# Patient Record
Sex: Female | Born: 1945 | Race: White | Hispanic: No | Smoking: Never smoker
Health system: Southern US, Community
[De-identification: ages and names within clinical notes are randomized; demographics above are authoritative.]

## PROBLEM LIST (undated history)

## (undated) DIAGNOSIS — M25511 Pain in right shoulder: Secondary | ICD-10-CM

## (undated) DIAGNOSIS — E785 Hyperlipidemia, unspecified: Secondary | ICD-10-CM

## (undated) DIAGNOSIS — I10 Essential (primary) hypertension: Secondary | ICD-10-CM

## (undated) DIAGNOSIS — M7730 Calcaneal spur, unspecified foot: Secondary | ICD-10-CM

## (undated) DIAGNOSIS — R0600 Dyspnea, unspecified: Secondary | ICD-10-CM

## (undated) DIAGNOSIS — F329 Major depressive disorder, single episode, unspecified: Secondary | ICD-10-CM

## (undated) DIAGNOSIS — F32A Depression, unspecified: Secondary | ICD-10-CM

## (undated) DIAGNOSIS — K922 Gastrointestinal hemorrhage, unspecified: Secondary | ICD-10-CM

## (undated) DIAGNOSIS — G629 Polyneuropathy, unspecified: Secondary | ICD-10-CM

## (undated) DIAGNOSIS — K439 Ventral hernia without obstruction or gangrene: Secondary | ICD-10-CM

## (undated) DIAGNOSIS — M7660 Achilles tendinitis, unspecified leg: Secondary | ICD-10-CM

## (undated) DIAGNOSIS — K648 Other hemorrhoids: Secondary | ICD-10-CM

## (undated) DIAGNOSIS — N179 Acute kidney failure, unspecified: Secondary | ICD-10-CM

## (undated) DIAGNOSIS — K5730 Diverticulosis of large intestine without perforation or abscess without bleeding: Secondary | ICD-10-CM

## (undated) DIAGNOSIS — I872 Venous insufficiency (chronic) (peripheral): Secondary | ICD-10-CM

## (undated) DIAGNOSIS — M1990 Unspecified osteoarthritis, unspecified site: Secondary | ICD-10-CM

## (undated) DIAGNOSIS — K219 Gastro-esophageal reflux disease without esophagitis: Secondary | ICD-10-CM

## (undated) HISTORY — DX: Diverticulosis of large intestine without perforation or abscess without bleeding: K57.30

## (undated) HISTORY — DX: Gastro-esophageal reflux disease without esophagitis: K21.9

## (undated) HISTORY — DX: Other hemorrhoids: K64.8

## (undated) HISTORY — DX: Achilles tendinitis, unspecified leg: M76.60

## (undated) HISTORY — DX: Essential (primary) hypertension: I10

## (undated) HISTORY — DX: Hyperlipidemia, unspecified: E78.5

## (undated) HISTORY — DX: Major depressive disorder, single episode, unspecified: F32.9

## (undated) HISTORY — DX: Venous insufficiency (chronic) (peripheral): I87.2

## (undated) HISTORY — DX: Ventral hernia without obstruction or gangrene: K43.9

## (undated) HISTORY — PX: TONSILLECTOMY: SUR1361

## (undated) HISTORY — DX: Polyneuropathy, unspecified: G62.9

## (undated) HISTORY — DX: Pain in right shoulder: M25.511

## (undated) HISTORY — DX: Gastrointestinal hemorrhage, unspecified: K92.2

## (undated) HISTORY — DX: Depression, unspecified: F32.A

## (undated) HISTORY — DX: Calcaneal spur, unspecified foot: M77.30

## (undated) HISTORY — PX: INCISIONAL HERNIA REPAIR: SHX193

---

## 1999-05-25 ENCOUNTER — Ambulatory Visit (HOSPITAL_COMMUNITY): Admission: RE | Admit: 1999-05-25 | Discharge: 1999-05-25 | Payer: Self-pay | Admitting: Family Medicine

## 1999-12-10 HISTORY — PX: ABDOMINAL HYSTERECTOMY: SHX81

## 1999-12-10 HISTORY — PX: CHOLECYSTECTOMY: SHX55

## 2000-04-14 ENCOUNTER — Other Ambulatory Visit: Admission: RE | Admit: 2000-04-14 | Discharge: 2000-04-14 | Payer: Self-pay | Admitting: Family Medicine

## 2000-04-15 ENCOUNTER — Encounter: Payer: Self-pay | Admitting: Family Medicine

## 2000-04-15 ENCOUNTER — Encounter: Admission: RE | Admit: 2000-04-15 | Discharge: 2000-04-15 | Payer: Self-pay | Admitting: Family Medicine

## 2000-04-28 ENCOUNTER — Encounter (INDEPENDENT_AMBULATORY_CARE_PROVIDER_SITE_OTHER): Payer: Self-pay

## 2000-04-28 ENCOUNTER — Other Ambulatory Visit: Admission: RE | Admit: 2000-04-28 | Discharge: 2000-04-28 | Payer: Self-pay | Admitting: General Surgery

## 2000-05-22 ENCOUNTER — Encounter (INDEPENDENT_AMBULATORY_CARE_PROVIDER_SITE_OTHER): Payer: Self-pay

## 2000-05-22 ENCOUNTER — Other Ambulatory Visit: Admission: RE | Admit: 2000-05-22 | Discharge: 2000-05-22 | Payer: Self-pay | Admitting: Obstetrics and Gynecology

## 2000-08-29 ENCOUNTER — Other Ambulatory Visit: Admission: RE | Admit: 2000-08-29 | Discharge: 2000-08-29 | Payer: Self-pay | Admitting: Obstetrics and Gynecology

## 2000-09-22 ENCOUNTER — Inpatient Hospital Stay (HOSPITAL_COMMUNITY): Admission: RE | Admit: 2000-09-22 | Discharge: 2000-09-25 | Payer: Self-pay | Admitting: Obstetrics and Gynecology

## 2000-09-22 ENCOUNTER — Encounter (INDEPENDENT_AMBULATORY_CARE_PROVIDER_SITE_OTHER): Payer: Self-pay

## 2000-10-04 ENCOUNTER — Inpatient Hospital Stay (HOSPITAL_COMMUNITY): Admission: AD | Admit: 2000-10-04 | Discharge: 2000-10-13 | Payer: Self-pay | Admitting: Obstetrics & Gynecology

## 2000-10-06 ENCOUNTER — Encounter: Payer: Self-pay | Admitting: Obstetrics & Gynecology

## 2000-10-09 ENCOUNTER — Encounter: Payer: Self-pay | Admitting: Obstetrics & Gynecology

## 2001-06-02 ENCOUNTER — Encounter: Admission: RE | Admit: 2001-06-02 | Discharge: 2001-06-02 | Payer: Self-pay | Admitting: General Surgery

## 2001-06-02 ENCOUNTER — Encounter: Payer: Self-pay | Admitting: General Surgery

## 2001-07-10 ENCOUNTER — Encounter: Payer: Self-pay | Admitting: General Surgery

## 2001-07-14 ENCOUNTER — Inpatient Hospital Stay (HOSPITAL_COMMUNITY): Admission: RE | Admit: 2001-07-14 | Discharge: 2001-07-17 | Payer: Self-pay | Admitting: General Surgery

## 2003-09-20 ENCOUNTER — Encounter: Admission: RE | Admit: 2003-09-20 | Discharge: 2003-09-20 | Payer: Self-pay | Admitting: Internal Medicine

## 2003-09-21 ENCOUNTER — Encounter: Admission: RE | Admit: 2003-09-21 | Discharge: 2003-09-21 | Payer: Self-pay | Admitting: Internal Medicine

## 2003-10-26 ENCOUNTER — Encounter: Admission: RE | Admit: 2003-10-26 | Discharge: 2003-10-26 | Payer: Self-pay | Admitting: Internal Medicine

## 2003-12-23 ENCOUNTER — Encounter: Admission: RE | Admit: 2003-12-23 | Discharge: 2003-12-23 | Payer: Self-pay | Admitting: Internal Medicine

## 2003-12-28 ENCOUNTER — Encounter: Admission: RE | Admit: 2003-12-28 | Discharge: 2003-12-28 | Payer: Self-pay | Admitting: Internal Medicine

## 2004-01-11 ENCOUNTER — Encounter: Admission: RE | Admit: 2004-01-11 | Discharge: 2004-01-11 | Payer: Self-pay | Admitting: Internal Medicine

## 2004-02-02 ENCOUNTER — Encounter: Admission: RE | Admit: 2004-02-02 | Discharge: 2004-02-02 | Payer: Self-pay | Admitting: Internal Medicine

## 2004-03-12 ENCOUNTER — Encounter: Admission: RE | Admit: 2004-03-12 | Discharge: 2004-03-12 | Payer: Self-pay | Admitting: Internal Medicine

## 2004-04-04 ENCOUNTER — Encounter: Admission: RE | Admit: 2004-04-04 | Discharge: 2004-04-04 | Payer: Self-pay | Admitting: Internal Medicine

## 2004-05-18 ENCOUNTER — Encounter: Admission: RE | Admit: 2004-05-18 | Discharge: 2004-05-18 | Payer: Self-pay | Admitting: Internal Medicine

## 2004-05-25 ENCOUNTER — Encounter: Admission: RE | Admit: 2004-05-25 | Discharge: 2004-05-25 | Payer: Self-pay | Admitting: Internal Medicine

## 2004-12-07 ENCOUNTER — Ambulatory Visit: Payer: Self-pay | Admitting: Internal Medicine

## 2004-12-24 ENCOUNTER — Ambulatory Visit: Payer: Self-pay | Admitting: Internal Medicine

## 2004-12-31 ENCOUNTER — Ambulatory Visit: Payer: Self-pay | Admitting: Internal Medicine

## 2005-01-14 ENCOUNTER — Ambulatory Visit: Payer: Self-pay | Admitting: Internal Medicine

## 2005-03-15 ENCOUNTER — Ambulatory Visit: Payer: Self-pay | Admitting: Internal Medicine

## 2005-05-03 ENCOUNTER — Ambulatory Visit: Payer: Self-pay | Admitting: Internal Medicine

## 2005-06-18 ENCOUNTER — Ambulatory Visit: Payer: Self-pay | Admitting: Internal Medicine

## 2005-07-23 ENCOUNTER — Ambulatory Visit: Payer: Self-pay | Admitting: Internal Medicine

## 2005-08-27 ENCOUNTER — Ambulatory Visit: Payer: Self-pay | Admitting: Internal Medicine

## 2005-09-17 ENCOUNTER — Ambulatory Visit: Payer: Self-pay | Admitting: Hospitalist

## 2005-09-17 ENCOUNTER — Ambulatory Visit: Payer: Self-pay | Admitting: Internal Medicine

## 2005-09-17 ENCOUNTER — Inpatient Hospital Stay (HOSPITAL_COMMUNITY): Admission: AD | Admit: 2005-09-17 | Discharge: 2005-09-18 | Payer: Self-pay | Admitting: Internal Medicine

## 2005-09-18 ENCOUNTER — Encounter: Payer: Self-pay | Admitting: Cardiology

## 2005-09-18 ENCOUNTER — Ambulatory Visit: Payer: Self-pay | Admitting: Cardiology

## 2005-09-24 ENCOUNTER — Ambulatory Visit: Payer: Self-pay

## 2005-09-27 ENCOUNTER — Ambulatory Visit: Payer: Self-pay | Admitting: Internal Medicine

## 2005-10-08 ENCOUNTER — Ambulatory Visit: Payer: Self-pay | Admitting: Internal Medicine

## 2005-10-22 ENCOUNTER — Ambulatory Visit: Payer: Self-pay | Admitting: Internal Medicine

## 2005-10-29 ENCOUNTER — Ambulatory Visit: Payer: Self-pay | Admitting: Internal Medicine

## 2006-01-21 ENCOUNTER — Ambulatory Visit: Payer: Self-pay | Admitting: Internal Medicine

## 2006-02-25 ENCOUNTER — Ambulatory Visit: Payer: Self-pay | Admitting: Hospitalist

## 2006-02-26 ENCOUNTER — Ambulatory Visit (HOSPITAL_COMMUNITY): Admission: RE | Admit: 2006-02-26 | Discharge: 2006-02-26 | Payer: Self-pay | Admitting: Internal Medicine

## 2006-02-26 ENCOUNTER — Ambulatory Visit: Payer: Self-pay | Admitting: Hospitalist

## 2006-03-18 ENCOUNTER — Ambulatory Visit: Payer: Self-pay | Admitting: Hospitalist

## 2006-09-19 DIAGNOSIS — Z9079 Acquired absence of other genital organ(s): Secondary | ICD-10-CM | POA: Insufficient documentation

## 2006-09-19 DIAGNOSIS — Z9089 Acquired absence of other organs: Secondary | ICD-10-CM | POA: Insufficient documentation

## 2006-09-19 DIAGNOSIS — I10 Essential (primary) hypertension: Secondary | ICD-10-CM | POA: Insufficient documentation

## 2006-09-19 DIAGNOSIS — I872 Venous insufficiency (chronic) (peripheral): Secondary | ICD-10-CM | POA: Insufficient documentation

## 2006-09-19 DIAGNOSIS — E1149 Type 2 diabetes mellitus with other diabetic neurological complication: Secondary | ICD-10-CM | POA: Insufficient documentation

## 2006-10-01 ENCOUNTER — Encounter (INDEPENDENT_AMBULATORY_CARE_PROVIDER_SITE_OTHER): Payer: Self-pay | Admitting: Infectious Diseases

## 2006-10-01 ENCOUNTER — Ambulatory Visit: Payer: Self-pay | Admitting: Internal Medicine

## 2006-10-01 LAB — CONVERTED CEMR LAB
ALT: 32 units/L (ref 0–40)
AST: 26 units/L (ref 0–37)
Albumin: 4.2 g/dL (ref 3.5–5.2)
Alkaline Phosphatase: 113 units/L (ref 39–117)
Amylase: 23 units/L (ref 0–105)
BUN: 11 mg/dL (ref 6–23)
CO2: 29 meq/L (ref 19–32)
Calcium: 9.2 mg/dL (ref 8.4–10.5)
Chloride: 103 meq/L (ref 96–112)
Cholesterol: 282 mg/dL — ABNORMAL HIGH (ref 0–200)
Creatinine, Ser: 0.78 mg/dL (ref 0.40–1.20)
Creatinine, Urine: 11 mg/dL
Glucose, Bld: 88 mg/dL (ref 70–99)
HDL: 43 mg/dL (ref >39)
Helicobacter Pylori Antibody-IgG: 0.4
LDL Cholesterol: 191 mg/dL — ABNORMAL HIGH (ref 0–99)
Lipase: <10 units/L (ref 0–75)
Microalb Creat Ratio: 18.2 mg/g (ref 0.0–30.0)
Microalb, Ur: 0.2 mg/dL (ref 0.00–1.89)
Potassium: 4.1 meq/L (ref 3.5–5.3)
Sodium: 142 meq/L (ref 135–145)
Total Bilirubin: 0.4 mg/dL (ref 0.3–1.2)
Total CHOL/HDL Ratio: 6.6
Total Protein: 7.4 g/dL (ref 6.0–8.3)
Triglycerides: 241 mg/dL — ABNORMAL HIGH (ref <150)
VLDL: 48 mg/dL — ABNORMAL HIGH (ref 0–40)

## 2006-10-08 ENCOUNTER — Ambulatory Visit (HOSPITAL_COMMUNITY): Admission: RE | Admit: 2006-10-08 | Discharge: 2006-10-08 | Payer: Self-pay | Admitting: Internal Medicine

## 2006-10-08 ENCOUNTER — Ambulatory Visit: Payer: Self-pay | Admitting: Cardiovascular Disease

## 2006-10-08 ENCOUNTER — Encounter: Payer: Self-pay | Admitting: Cardiovascular Disease

## 2006-12-27 DIAGNOSIS — K432 Incisional hernia without obstruction or gangrene: Secondary | ICD-10-CM | POA: Insufficient documentation

## 2006-12-27 DIAGNOSIS — E1169 Type 2 diabetes mellitus with other specified complication: Secondary | ICD-10-CM | POA: Insufficient documentation

## 2006-12-27 DIAGNOSIS — E785 Hyperlipidemia, unspecified: Secondary | ICD-10-CM | POA: Insufficient documentation

## 2007-04-29 ENCOUNTER — Ambulatory Visit: Payer: Self-pay | Admitting: Internal Medicine

## 2007-04-29 ENCOUNTER — Encounter (INDEPENDENT_AMBULATORY_CARE_PROVIDER_SITE_OTHER): Payer: Self-pay | Admitting: Pulmonary Disease

## 2007-04-29 LAB — CONVERTED CEMR LAB
ALT: 37 units/L — ABNORMAL HIGH (ref 0–35)
AST: 25 units/L (ref 0–37)
Albumin: 4.2 g/dL (ref 3.5–5.2)
Alkaline Phosphatase: 141 units/L — ABNORMAL HIGH (ref 39–117)
BUN: 14 mg/dL (ref 6–23)
Blood Glucose, Fingerstick: 98
CO2: 31 meq/L (ref 19–32)
Calcium: 9.2 mg/dL (ref 8.4–10.5)
Chloride: 101 meq/L (ref 96–112)
Creatinine, Ser: 0.84 mg/dL (ref 0.40–1.20)
Glucose, Bld: 77 mg/dL (ref 70–99)
Hgb A1c MFr Bld: 8.1 %
Potassium: 3.6 meq/L (ref 3.5–5.3)
Sodium: 142 meq/L (ref 135–145)
Total Bilirubin: 0.4 mg/dL (ref 0.3–1.2)
Total Protein: 7.6 g/dL (ref 6.0–8.3)

## 2007-05-01 ENCOUNTER — Ambulatory Visit: Payer: Self-pay | Admitting: Vascular Surgery

## 2007-05-01 ENCOUNTER — Encounter: Payer: Self-pay | Admitting: Internal Medicine

## 2007-05-01 ENCOUNTER — Ambulatory Visit (HOSPITAL_COMMUNITY): Admission: RE | Admit: 2007-05-01 | Discharge: 2007-05-01 | Payer: Self-pay | Admitting: Pulmonary Disease

## 2007-05-13 ENCOUNTER — Ambulatory Visit: Payer: Self-pay | Admitting: Internal Medicine

## 2007-05-13 LAB — CONVERTED CEMR LAB: Blood Glucose, Fingerstick: 136

## 2007-06-18 ENCOUNTER — Ambulatory Visit: Payer: Self-pay | Admitting: Internal Medicine

## 2007-06-18 ENCOUNTER — Encounter (INDEPENDENT_AMBULATORY_CARE_PROVIDER_SITE_OTHER): Payer: Self-pay | Admitting: Internal Medicine

## 2007-06-18 DIAGNOSIS — E118 Type 2 diabetes mellitus with unspecified complications: Secondary | ICD-10-CM | POA: Insufficient documentation

## 2007-06-18 DIAGNOSIS — F329 Major depressive disorder, single episode, unspecified: Secondary | ICD-10-CM | POA: Insufficient documentation

## 2007-06-18 DIAGNOSIS — E119 Type 2 diabetes mellitus without complications: Secondary | ICD-10-CM | POA: Insufficient documentation

## 2007-06-18 DIAGNOSIS — E1165 Type 2 diabetes mellitus with hyperglycemia: Secondary | ICD-10-CM | POA: Insufficient documentation

## 2007-06-18 DIAGNOSIS — K219 Gastro-esophageal reflux disease without esophagitis: Secondary | ICD-10-CM | POA: Insufficient documentation

## 2007-06-18 DIAGNOSIS — E1149 Type 2 diabetes mellitus with other diabetic neurological complication: Secondary | ICD-10-CM | POA: Insufficient documentation

## 2007-06-18 LAB — CONVERTED CEMR LAB: Blood Glucose, Fingerstick: 135

## 2007-06-19 LAB — CONVERTED CEMR LAB
ALT: 29 units/L (ref 0–35)
AST: 23 units/L (ref 0–37)
Albumin: 4.1 g/dL (ref 3.5–5.2)
Alkaline Phosphatase: 120 units/L — ABNORMAL HIGH (ref 39–117)
BUN: 14 mg/dL (ref 6–23)
CO2: 28 meq/L (ref 19–32)
Calcium: 9.3 mg/dL (ref 8.4–10.5)
Chloride: 101 meq/L (ref 96–112)
Cholesterol: 149 mg/dL (ref 0–200)
Creatinine, Ser: 0.71 mg/dL (ref 0.40–1.20)
Glucose, Bld: 133 mg/dL — ABNORMAL HIGH (ref 70–99)
HDL: 40 mg/dL (ref >39)
LDL Cholesterol: 75 mg/dL (ref 0–99)
Potassium: 4.3 meq/L (ref 3.5–5.3)
Sodium: 140 meq/L (ref 135–145)
Total Bilirubin: 0.4 mg/dL (ref 0.3–1.2)
Total CHOL/HDL Ratio: 3.7
Total CK: 57 units/L (ref 7–177)
Total Protein: 7.5 g/dL (ref 6.0–8.3)
Triglycerides: 169 mg/dL — ABNORMAL HIGH (ref <150)
VLDL: 34 mg/dL (ref 0–40)

## 2007-07-08 ENCOUNTER — Telehealth (INDEPENDENT_AMBULATORY_CARE_PROVIDER_SITE_OTHER): Payer: Self-pay | Admitting: Pharmacy Technician

## 2007-08-18 ENCOUNTER — Telehealth (INDEPENDENT_AMBULATORY_CARE_PROVIDER_SITE_OTHER): Payer: Self-pay | Admitting: Pharmacy Technician

## 2007-09-11 ENCOUNTER — Telehealth: Payer: Self-pay | Admitting: *Deleted

## 2007-09-18 ENCOUNTER — Ambulatory Visit: Payer: Self-pay | Admitting: Internal Medicine

## 2007-09-18 ENCOUNTER — Encounter (INDEPENDENT_AMBULATORY_CARE_PROVIDER_SITE_OTHER): Payer: Self-pay | Admitting: Infectious Diseases

## 2007-09-18 LAB — CONVERTED CEMR LAB
Blood Glucose, Fingerstick: 165
Hgb A1c MFr Bld: 8.9 %

## 2007-09-28 ENCOUNTER — Ambulatory Visit: Payer: Self-pay | Admitting: *Deleted

## 2007-09-28 LAB — CONVERTED CEMR LAB
ALT: 29 units/L (ref 0–35)
AST: 23 units/L (ref 0–37)
Albumin: 3.9 g/dL (ref 3.5–5.2)
Alkaline Phosphatase: 100 units/L (ref 39–117)
BUN: 20 mg/dL (ref 6–23)
Blood Glucose, Home Monitor: 2 mg/dL
CO2: 30 meq/L (ref 19–32)
Calcium: 9.5 mg/dL (ref 8.4–10.5)
Chloride: 97 meq/L (ref 96–112)
Creatinine, Ser: 0.88 mg/dL (ref 0.40–1.20)
Glucose, Bld: 156 mg/dL — ABNORMAL HIGH (ref 70–99)
Potassium: 4.1 meq/L (ref 3.5–5.3)
Pro B Natriuretic peptide (BNP): <30 pg/mL (ref 0.0–100.0)
Sodium: 138 meq/L (ref 135–145)
Total Bilirubin: 0.6 mg/dL (ref 0.3–1.2)
Total Protein: 7.6 g/dL (ref 6.0–8.3)

## 2007-10-22 ENCOUNTER — Telehealth (INDEPENDENT_AMBULATORY_CARE_PROVIDER_SITE_OTHER): Payer: Self-pay | Admitting: *Deleted

## 2007-10-22 ENCOUNTER — Telehealth (INDEPENDENT_AMBULATORY_CARE_PROVIDER_SITE_OTHER): Payer: Self-pay | Admitting: Infectious Diseases

## 2007-11-11 ENCOUNTER — Telehealth (INDEPENDENT_AMBULATORY_CARE_PROVIDER_SITE_OTHER): Payer: Self-pay | Admitting: Infectious Diseases

## 2007-12-07 ENCOUNTER — Telehealth (INDEPENDENT_AMBULATORY_CARE_PROVIDER_SITE_OTHER): Payer: Self-pay | Admitting: *Deleted

## 2007-12-18 ENCOUNTER — Telehealth (INDEPENDENT_AMBULATORY_CARE_PROVIDER_SITE_OTHER): Payer: Self-pay | Admitting: Infectious Diseases

## 2007-12-25 ENCOUNTER — Telehealth (INDEPENDENT_AMBULATORY_CARE_PROVIDER_SITE_OTHER): Payer: Self-pay | Admitting: Infectious Diseases

## 2008-02-01 ENCOUNTER — Telehealth (INDEPENDENT_AMBULATORY_CARE_PROVIDER_SITE_OTHER): Payer: Self-pay | Admitting: Infectious Diseases

## 2008-02-16 ENCOUNTER — Telehealth (INDEPENDENT_AMBULATORY_CARE_PROVIDER_SITE_OTHER): Payer: Self-pay | Admitting: Infectious Diseases

## 2008-03-14 ENCOUNTER — Ambulatory Visit: Payer: Self-pay | Admitting: Infectious Disease

## 2008-03-14 ENCOUNTER — Encounter (INDEPENDENT_AMBULATORY_CARE_PROVIDER_SITE_OTHER): Payer: Self-pay | Admitting: Infectious Diseases

## 2008-03-14 LAB — CONVERTED CEMR LAB
Blood Glucose, Fingerstick: 146
Hgb A1c MFr Bld: 10 %

## 2008-03-16 LAB — CONVERTED CEMR LAB
Cholesterol: 269 mg/dL — ABNORMAL HIGH (ref 0–200)
HDL: 39 mg/dL — ABNORMAL LOW (ref >39)
LDL Cholesterol: 179 mg/dL — ABNORMAL HIGH (ref 0–99)
Total CHOL/HDL Ratio: 6.9
Triglycerides: 254 mg/dL — ABNORMAL HIGH (ref <150)
VLDL: 51 mg/dL — ABNORMAL HIGH (ref 0–40)

## 2008-04-01 ENCOUNTER — Telehealth (INDEPENDENT_AMBULATORY_CARE_PROVIDER_SITE_OTHER): Payer: Self-pay | Admitting: Infectious Diseases

## 2008-04-20 ENCOUNTER — Telehealth (INDEPENDENT_AMBULATORY_CARE_PROVIDER_SITE_OTHER): Payer: Self-pay | Admitting: Infectious Diseases

## 2008-04-26 ENCOUNTER — Telehealth (INDEPENDENT_AMBULATORY_CARE_PROVIDER_SITE_OTHER): Payer: Self-pay | Admitting: Infectious Diseases

## 2008-04-28 ENCOUNTER — Telehealth: Payer: Self-pay | Admitting: *Deleted

## 2008-05-13 ENCOUNTER — Encounter (INDEPENDENT_AMBULATORY_CARE_PROVIDER_SITE_OTHER): Payer: Self-pay | Admitting: Infectious Diseases

## 2008-05-13 ENCOUNTER — Ambulatory Visit: Payer: Self-pay | Admitting: Internal Medicine

## 2008-05-13 LAB — CONVERTED CEMR LAB
BUN: 15 mg/dL (ref 6–23)
CO2: 25 meq/L (ref 19–32)
Calcium: 9.5 mg/dL (ref 8.4–10.5)
Chloride: 102 meq/L (ref 96–112)
Creatinine, Ser: 0.73 mg/dL (ref 0.40–1.20)
Glucose, Bld: 139 mg/dL — ABNORMAL HIGH (ref 70–99)
Potassium: 4 meq/L (ref 3.5–5.3)
Sodium: 143 meq/L (ref 135–145)

## 2008-05-17 ENCOUNTER — Telehealth (INDEPENDENT_AMBULATORY_CARE_PROVIDER_SITE_OTHER): Payer: Self-pay | Admitting: Infectious Diseases

## 2008-07-20 ENCOUNTER — Telehealth: Payer: Self-pay | Admitting: Infectious Diseases

## 2008-11-14 DIAGNOSIS — R42 Dizziness and giddiness: Secondary | ICD-10-CM | POA: Insufficient documentation

## 2008-11-18 ENCOUNTER — Ambulatory Visit: Payer: Self-pay | Admitting: Internal Medicine

## 2008-11-18 ENCOUNTER — Telehealth: Payer: Self-pay | Admitting: *Deleted

## 2008-11-18 LAB — CONVERTED CEMR LAB
Blood Glucose, Fingerstick: 184
Hgb A1c MFr Bld: 9.4 %

## 2008-11-23 ENCOUNTER — Ambulatory Visit: Payer: Self-pay | Admitting: Internal Medicine

## 2008-11-23 ENCOUNTER — Encounter: Payer: Self-pay | Admitting: Internal Medicine

## 2008-11-23 LAB — CONVERTED CEMR LAB
BUN: 13 mg/dL (ref 6–23)
CO2: 25 meq/L (ref 19–32)
Calcium: 9.2 mg/dL (ref 8.4–10.5)
Chloride: 102 meq/L (ref 96–112)
Cholesterol: 230 mg/dL — ABNORMAL HIGH (ref 0–200)
Creatinine, Ser: 0.77 mg/dL (ref 0.40–1.20)
Glucose, Bld: 173 mg/dL — ABNORMAL HIGH (ref 70–99)
HDL: 38 mg/dL — ABNORMAL LOW (ref >39)
LDL Cholesterol: 153 mg/dL — ABNORMAL HIGH (ref 0–99)
Potassium: 4.6 meq/L (ref 3.5–5.3)
Sodium: 139 meq/L (ref 135–145)
Total CHOL/HDL Ratio: 6.1
Triglycerides: 197 mg/dL — ABNORMAL HIGH (ref <150)
VLDL: 39 mg/dL (ref 0–40)

## 2009-02-20 ENCOUNTER — Telehealth (INDEPENDENT_AMBULATORY_CARE_PROVIDER_SITE_OTHER): Payer: Self-pay | Admitting: Internal Medicine

## 2009-02-21 ENCOUNTER — Telehealth (INDEPENDENT_AMBULATORY_CARE_PROVIDER_SITE_OTHER): Payer: Self-pay | Admitting: Pharmacy Technician

## 2009-03-23 ENCOUNTER — Encounter: Payer: Self-pay | Admitting: Internal Medicine

## 2009-03-23 ENCOUNTER — Ambulatory Visit: Payer: Self-pay | Admitting: Internal Medicine

## 2009-03-23 LAB — CONVERTED CEMR LAB
Blood Glucose, AC Bkfst: 268 mg/dL
Hgb A1c MFr Bld: 10.5 %

## 2009-03-24 LAB — CONVERTED CEMR LAB
ALT: 24 units/L (ref 0–35)
AST: 18 units/L (ref 0–37)
Albumin: 4.1 g/dL (ref 3.5–5.2)
Alkaline Phosphatase: 116 units/L (ref 39–117)
BUN: 14 mg/dL (ref 6–23)
CO2: 27 meq/L (ref 19–32)
Calcium: 9.4 mg/dL (ref 8.4–10.5)
Chloride: 98 meq/L (ref 96–112)
Cholesterol: 205 mg/dL — ABNORMAL HIGH (ref 0–200)
Creatinine, Ser: 0.71 mg/dL (ref 0.40–1.20)
Creatinine, Urine: 21.7 mg/dL
GFR calc Af Amer: >60 mL/min (ref >60)
GFR calc non Af Amer: >60 mL/min (ref >60)
Glucose, Bld: 266 mg/dL — ABNORMAL HIGH (ref 70–99)
HDL: 38 mg/dL — ABNORMAL LOW (ref >39)
LDL Cholesterol: 122 mg/dL — ABNORMAL HIGH (ref 0–99)
Microalb Creat Ratio: 23 mg/g (ref 0.0–30.0)
Microalb, Ur: 0.5 mg/dL (ref 0.00–1.89)
Potassium: 4.2 meq/L (ref 3.5–5.3)
Sodium: 139 meq/L (ref 135–145)
Total Bilirubin: 0.4 mg/dL (ref 0.3–1.2)
Total CHOL/HDL Ratio: 5.4
Total Protein: 7.7 g/dL (ref 6.0–8.3)
Triglycerides: 227 mg/dL — ABNORMAL HIGH (ref <150)
VLDL: 45 mg/dL — ABNORMAL HIGH (ref 0–40)

## 2009-04-05 ENCOUNTER — Telehealth: Payer: Self-pay | Admitting: Internal Medicine

## 2009-04-07 ENCOUNTER — Ambulatory Visit: Payer: Self-pay | Admitting: Sports Medicine

## 2009-04-07 DIAGNOSIS — M7660 Achilles tendinitis, unspecified leg: Secondary | ICD-10-CM | POA: Insufficient documentation

## 2009-04-07 DIAGNOSIS — M7730 Calcaneal spur, unspecified foot: Secondary | ICD-10-CM | POA: Insufficient documentation

## 2009-04-10 ENCOUNTER — Telehealth (INDEPENDENT_AMBULATORY_CARE_PROVIDER_SITE_OTHER): Payer: Self-pay | Admitting: *Deleted

## 2009-04-11 ENCOUNTER — Ambulatory Visit: Payer: Self-pay | Admitting: Infectious Disease

## 2009-04-11 ENCOUNTER — Encounter: Payer: Self-pay | Admitting: Internal Medicine

## 2009-04-11 LAB — CONVERTED CEMR LAB
Blood Glucose, Fingerstick: 134
Insulin/Carbohydrate Ratio: 1

## 2009-04-21 ENCOUNTER — Telehealth (INDEPENDENT_AMBULATORY_CARE_PROVIDER_SITE_OTHER): Payer: Self-pay | Admitting: *Deleted

## 2009-04-25 ENCOUNTER — Encounter: Payer: Self-pay | Admitting: Internal Medicine

## 2009-04-25 ENCOUNTER — Ambulatory Visit: Payer: Self-pay | Admitting: Internal Medicine

## 2009-04-25 LAB — CONVERTED CEMR LAB: Blood Glucose, Fingerstick: 146

## 2009-05-17 ENCOUNTER — Ambulatory Visit: Payer: Self-pay | Admitting: Internal Medicine

## 2009-05-17 LAB — FECAL OCCULT BLOOD, GUAIAC: Fecal Occult Blood: NEGATIVE

## 2009-05-17 LAB — CONVERTED CEMR LAB
OCCULT 1: NEGATIVE
OCCULT 2: NEGATIVE
OCCULT 3: NEGATIVE

## 2009-05-25 ENCOUNTER — Telehealth (INDEPENDENT_AMBULATORY_CARE_PROVIDER_SITE_OTHER): Payer: Self-pay | Admitting: *Deleted

## 2009-05-29 ENCOUNTER — Telehealth (INDEPENDENT_AMBULATORY_CARE_PROVIDER_SITE_OTHER): Payer: Self-pay | Admitting: *Deleted

## 2009-06-05 ENCOUNTER — Ambulatory Visit (HOSPITAL_COMMUNITY): Admission: RE | Admit: 2009-06-05 | Discharge: 2009-06-05 | Payer: Self-pay | Admitting: Internal Medicine

## 2009-07-11 ENCOUNTER — Ambulatory Visit: Payer: Self-pay | Admitting: Sports Medicine

## 2009-08-07 ENCOUNTER — Telehealth: Payer: Self-pay | Admitting: Internal Medicine

## 2009-08-17 ENCOUNTER — Telehealth (INDEPENDENT_AMBULATORY_CARE_PROVIDER_SITE_OTHER): Payer: Self-pay | Admitting: *Deleted

## 2009-08-29 ENCOUNTER — Ambulatory Visit: Payer: Self-pay | Admitting: Internal Medicine

## 2009-08-29 LAB — CONVERTED CEMR LAB
Blood Glucose, Fingerstick: 100
Hgb A1c MFr Bld: 7.4 %

## 2009-08-30 LAB — CONVERTED CEMR LAB
BUN: 14 mg/dL (ref 6–23)
CO2: 29 meq/L (ref 19–32)
Calcium: 9.4 mg/dL (ref 8.4–10.5)
Chloride: 101 meq/L (ref 96–112)
Cholesterol: 200 mg/dL (ref 0–200)
Creatinine, Ser: 0.72 mg/dL (ref 0.40–1.20)
Glucose, Bld: 89 mg/dL (ref 70–99)
HDL: 39 mg/dL — ABNORMAL LOW (ref >39)
LDL Cholesterol: 108 mg/dL — ABNORMAL HIGH (ref 0–99)
Potassium: 4.1 meq/L (ref 3.5–5.3)
Sodium: 142 meq/L (ref 135–145)
Total CHOL/HDL Ratio: 5.1
Triglycerides: 264 mg/dL — ABNORMAL HIGH (ref <150)
VLDL: 53 mg/dL — ABNORMAL HIGH (ref 0–40)

## 2009-09-06 ENCOUNTER — Encounter: Payer: Self-pay | Admitting: Internal Medicine

## 2009-09-25 ENCOUNTER — Ambulatory Visit: Payer: Self-pay | Admitting: Sports Medicine

## 2009-09-25 DIAGNOSIS — M25519 Pain in unspecified shoulder: Secondary | ICD-10-CM | POA: Insufficient documentation

## 2009-10-20 ENCOUNTER — Encounter: Payer: Self-pay | Admitting: Internal Medicine

## 2009-10-23 ENCOUNTER — Ambulatory Visit: Payer: Self-pay | Admitting: Sports Medicine

## 2009-10-30 ENCOUNTER — Telehealth: Payer: Self-pay | Admitting: Internal Medicine

## 2009-10-31 ENCOUNTER — Ambulatory Visit: Payer: Self-pay | Admitting: Internal Medicine

## 2009-11-22 ENCOUNTER — Telehealth: Payer: Self-pay | Admitting: Internal Medicine

## 2009-11-24 ENCOUNTER — Telehealth: Payer: Self-pay | Admitting: Internal Medicine

## 2009-12-06 ENCOUNTER — Telehealth (INDEPENDENT_AMBULATORY_CARE_PROVIDER_SITE_OTHER): Payer: Self-pay | Admitting: *Deleted

## 2009-12-19 ENCOUNTER — Ambulatory Visit: Payer: Self-pay | Admitting: Sports Medicine

## 2009-12-21 ENCOUNTER — Telehealth: Payer: Self-pay | Admitting: Internal Medicine

## 2010-01-23 ENCOUNTER — Ambulatory Visit: Payer: Self-pay | Admitting: Sports Medicine

## 2010-01-24 ENCOUNTER — Encounter: Payer: Self-pay | Admitting: Family Medicine

## 2010-01-30 ENCOUNTER — Encounter: Admission: RE | Admit: 2010-01-30 | Discharge: 2010-04-09 | Payer: Self-pay | Admitting: Family Medicine

## 2010-02-28 ENCOUNTER — Telehealth: Payer: Self-pay | Admitting: Internal Medicine

## 2010-03-01 ENCOUNTER — Encounter: Payer: Self-pay | Admitting: Family Medicine

## 2010-03-06 ENCOUNTER — Ambulatory Visit: Payer: Self-pay | Admitting: Sports Medicine

## 2010-03-19 ENCOUNTER — Telehealth: Payer: Self-pay | Admitting: Internal Medicine

## 2010-03-29 ENCOUNTER — Telehealth: Payer: Self-pay | Admitting: Internal Medicine

## 2010-04-17 ENCOUNTER — Ambulatory Visit: Payer: Self-pay | Admitting: Sports Medicine

## 2010-04-24 ENCOUNTER — Telehealth: Payer: Self-pay | Admitting: Internal Medicine

## 2010-04-24 ENCOUNTER — Ambulatory Visit: Payer: Self-pay | Admitting: Infectious Disease

## 2010-04-24 ENCOUNTER — Encounter: Payer: Self-pay | Admitting: Internal Medicine

## 2010-04-24 ENCOUNTER — Ambulatory Visit (HOSPITAL_COMMUNITY): Admission: RE | Admit: 2010-04-24 | Discharge: 2010-04-24 | Payer: Self-pay | Admitting: Infectious Disease

## 2010-04-24 LAB — CONVERTED CEMR LAB
ALT: 24 units/L (ref 0–35)
AST: 19 units/L (ref 0–37)
Albumin: 3.8 g/dL (ref 3.5–5.2)
Alkaline Phosphatase: 133 units/L — ABNORMAL HIGH (ref 39–117)
BUN: 14 mg/dL (ref 6–23)
Basophils Absolute: 0 10*3/uL (ref 0.0–0.1)
Basophils Relative: 0 % (ref 0–1)
Blood Glucose, Fingerstick: 126
CO2: 30 meq/L (ref 19–32)
Calcium: 9.5 mg/dL (ref 8.4–10.5)
Chloride: 100 meq/L (ref 96–112)
Creatinine, Ser: 0.7 mg/dL (ref 0.40–1.20)
Eosinophils Absolute: 0.2 10*3/uL (ref 0.0–0.7)
Eosinophils Relative: 1 % (ref 0–5)
Glucose, Bld: 118 mg/dL — ABNORMAL HIGH (ref 70–99)
HCT: 45.5 % (ref 36.0–46.0)
Hemoglobin: 15.4 g/dL — ABNORMAL HIGH (ref 12.0–15.0)
Hgb A1c MFr Bld: 9.2 %
Lipase: 22 units/L (ref 11–59)
Lymphocytes Relative: 25 % (ref 12–46)
Lymphs Abs: 3.4 10*3/uL (ref 0.7–4.0)
MCHC: 33.9 g/dL (ref 30.0–36.0)
MCV: 83.3 fL (ref >=78.0)
Monocytes Absolute: 0.9 10*3/uL (ref 0.1–1.0)
Monocytes Relative: 6 % (ref 3–12)
Neutro Abs: 9.3 10*3/uL — ABNORMAL HIGH (ref 1.7–7.7)
Neutrophils Relative %: 67 % (ref 43–77)
Platelets: 322 10*3/uL (ref 150–400)
Potassium: 4 meq/L (ref 3.5–5.3)
RBC: 5.46 M/uL — ABNORMAL HIGH (ref 3.87–5.11)
RDW: 14 % (ref 11.5–15.5)
Sodium: 139 meq/L (ref 135–145)
Total Bilirubin: 0.7 mg/dL (ref 0.3–1.2)
Total Protein: 8.1 g/dL (ref 6.0–8.3)
WBC: 13.9 10*3/uL — ABNORMAL HIGH (ref 4.0–10.5)

## 2010-04-25 ENCOUNTER — Telehealth: Payer: Self-pay | Admitting: Internal Medicine

## 2010-04-25 ENCOUNTER — Ambulatory Visit: Payer: Self-pay | Admitting: Infectious Disease

## 2010-04-25 ENCOUNTER — Ambulatory Visit (HOSPITAL_COMMUNITY): Admission: RE | Admit: 2010-04-25 | Discharge: 2010-04-25 | Payer: Self-pay | Admitting: Infectious Disease

## 2010-04-26 ENCOUNTER — Encounter: Payer: Self-pay | Admitting: Internal Medicine

## 2010-05-02 ENCOUNTER — Ambulatory Visit: Payer: Self-pay | Admitting: Internal Medicine

## 2010-05-02 LAB — CONVERTED CEMR LAB: Blood Glucose, Fingerstick: 169

## 2010-05-31 ENCOUNTER — Telehealth: Payer: Self-pay | Admitting: Internal Medicine

## 2010-06-04 ENCOUNTER — Telehealth: Payer: Self-pay | Admitting: Internal Medicine

## 2010-06-12 ENCOUNTER — Ambulatory Visit: Payer: Self-pay | Admitting: Internal Medicine

## 2010-06-12 LAB — CONVERTED CEMR LAB: Blood Glucose, Fingerstick: 127

## 2010-06-15 ENCOUNTER — Encounter: Payer: Self-pay | Admitting: Internal Medicine

## 2010-06-15 LAB — HM DIABETES EYE EXAM

## 2010-06-22 ENCOUNTER — Ambulatory Visit (HOSPITAL_COMMUNITY): Admission: RE | Admit: 2010-06-22 | Discharge: 2010-06-22 | Payer: Self-pay | Admitting: Internal Medicine

## 2010-06-22 ENCOUNTER — Encounter: Payer: Self-pay | Admitting: Internal Medicine

## 2010-06-22 LAB — HM MAMMOGRAPHY: HM Mammogram: NEGATIVE

## 2010-07-20 ENCOUNTER — Telehealth: Payer: Self-pay | Admitting: Internal Medicine

## 2010-07-30 ENCOUNTER — Encounter: Payer: Self-pay | Admitting: Internal Medicine

## 2010-08-08 ENCOUNTER — Encounter: Payer: Self-pay | Admitting: Internal Medicine

## 2010-10-11 ENCOUNTER — Telehealth: Payer: Self-pay | Admitting: *Deleted

## 2010-11-09 ENCOUNTER — Ambulatory Visit: Payer: Self-pay | Admitting: Internal Medicine

## 2010-11-09 LAB — CONVERTED CEMR LAB
Blood Glucose, Fingerstick: 160
Hgb A1c MFr Bld: 7.9 %

## 2010-11-09 LAB — HM DIABETES FOOT EXAM

## 2010-11-20 ENCOUNTER — Telehealth: Payer: Self-pay | Admitting: Internal Medicine

## 2010-12-30 ENCOUNTER — Encounter: Payer: Self-pay | Admitting: Infectious Diseases

## 2010-12-31 ENCOUNTER — Encounter: Payer: Self-pay | Admitting: Internal Medicine

## 2011-01-08 NOTE — Consult Note (Signed)
[facility]   Imported By: Margie Billet 09/13/2010 11:23:52  _____________________________________________________________________  External Attachment:    Type:   Image     Comment:   External Document

## 2011-01-08 NOTE — Progress Notes (Signed)
Summary: Patient Assistance  Phone Note Refill Request        Prescriptions: PRANDIN 1 MG  TABS (REPAGLINIDE) Take 1 tablet by mouth three times a day  #90 x .   Entered and Authorized by:   Concepcion Elk   Signed by:   Ronda Fairly MD on 02/01/2008   Method used:   Samples Given   RxID:   [id number]   Patient Assist Medication Verification: Medication: Prandin 1mg  Lot# [id number] Exp Date:12-2011 Tech approval:NLS

## 2011-01-08 NOTE — Progress Notes (Signed)
Summary: refill/ hla  Phone Note Refill Request Message from:  Patient on May 31, 2010 5:33 PM  Refills Requested: Medication #1:  PRILOSEC 40 MG CPDR Take 1 capsule by mouth once a day   Dosage confirmed as above?Dosage Confirmed   Last Refilled: 04/17/2010 Initial call taken by: Marin Roberts RN,  May 31, 2010 5:33 PM    Prescriptions: PRILOSEC 40 MG CPDR (OMEPRAZOLE) Take 1 capsule by mouth once a day  #31 x 11   Entered and Authorized by:   Deatra Robinson MD   Signed by:   Deatra Robinson MD on 06/02/2010   Method used:   Faxed to ...       [facility] (retail)       [address]       [place], [place]  [address]       Ph: [phone]       Fax: [phone]   RxID:   [id number]

## 2011-01-08 NOTE — Assessment & Plan Note (Signed)
Summary: F/U [facility]   Vital Signs:  Patient profile:   65 year old female BP sitting:   133 / 82  Vitals Entered By: Lillia Pauls CMA (January 23, 2010 1:48 PM)  Primary Care Provider:  Jackson Latino MD   History of Present Illness: 65 yo F here for f/u R shoulder and R achilles tendinopathy  Rotator Cuff tendinopathy Feels about the same as last visit. Range of motion feels worse than before. States compliant with home Codman exercises. Had cortisone injection  ~ 4 months ago. Still cannot get comfortable in bed No problems with nitro patches and using as directed  RT AT Almost completely resolved - was on nitro Continuing with eccentric exercises.  Allergies (verified): 1)  ! Morphine 2)  ! Codeine Sulfate (Codeine Sulfate) 3)  ! Darvon 4)  ! * Hyfromorphone (See Dose Form) 5)  ! Demerol 6)  ! * Tylox/percocet  Physical Exam  General:  alert, well-developed, well-nourished, and well-hydrated.   Msk:  R shoulder: No gross deformity. ROM limited to 140 flexion and 130 abduction - limited 2/2 pain and guarding passive ROM 70 ER - similar on left strength mildly decreased empty can and resisted internal rotation. no drop arm + hawkins and neers  Bilateral AT No achilles TTP. Haglund's deformity noted No limp   Impression & Recommendations:  Problem # 1:  SHOULDER PAIN, RIGHT (ICD-719.41) Assessment Unchanged  Exam similar to last visit - intraarticular injection + physical therapy to help with progression and relief of pain.  Tramadol as needed for pain.  Cont nitro patches.  After informed verbal consent, patient was seated on the exam table and right shoulder was prepped with alcohol swab - injected 3:1 intraarticularly using posterior approach.  Tolerated the procedure well without any immediate complications.  Her updated medication list for this problem includes:    Tramadol Hcl 50 Mg Tabs (Tramadol hcl) .Marland Kitchen... 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  Complete Medication List: 1)  Lisinopril 40 Mg Tabs (Lisinopril) .... Take 1 tablet by mouth once a day 2)  Lantus 100 Unit/ml Soln (Insulin glargine) .... Inject 70  units subcutaneously once a day 3)  Neurontin 300 Mg Caps (Gabapentin) .... Take 1 tablet in the morning and 2 tablets at bedtime 4)  Prilosec 40 Mg Cpdr (Omeprazole) .... Take 1 capsule by mouth once a day 5)  Glucophage 500 Mg Tabs (Metformin hcl) .... Take 1 tablet by mouth twice  a day 6)  Prandin 1 Mg Tabs (Repaglinide) .... Take 1 tablet by mouth three times a day 7)  Furosemide 40 Mg Tabs (Furosemide) .... Take one by mouth once daily 8)  Nitro-dur 0.2 Mg/hr Pt24 (Nitroglycerin) .... Apply 1/4 patch to heel in morning and leave on 24 hours 9)  Novolog 100 Unit/ml Soln (Insulin aspart) .Marland Kitchen.. 10 units subcutaneous injection with each meal. 10)  Lipitor 20 Mg Tabs (Atorvastatin calcium) .... Take 1 tablet by mouth once a day 11)  Tramadol Hcl 50 Mg Tabs (Tramadol hcl) .... Take one by mouth two times a day as needed pain  Patient Instructions: 1)  Continue using nitro patches. 2)  Shots into the joint every 3 months can help with this condition. 3)  Continue doing your exercises and start physical therapy. 4)  Take pain medication prior to going to  physical therapy. 5)  Follow up with [place] in 6 weeks.

## 2011-01-08 NOTE — Progress Notes (Signed)
Summary: Patient Assistance    Prescriptions: NEURONTIN 300 MG CAPS (GABAPENTIN) Take 1 tablet in the morning and 2 tablets at bedtime  #300 x 0   Entered and Authorized by:   Concepcion Elk   Signed by:   Ronda Fairly MD on 01/26/2008   Method used:   Samples Given   RxID:   [id number]   Patient Assist Medication Verification: Medication: Neurontin 300mg  Lot# [id number] Exp Date:10-11 Tech approval:NLS               Prescription/Samples picked up by: patient ..................................................................Marland KitchenConcepcion Elk  January 26, 2008 8:35 AM

## 2011-01-08 NOTE — Miscellaneous (Signed)
Summary: [facility]  [facility]   Imported By: Marily Memos 03/07/2010 09:05:21  _____________________________________________________________________  External Attachment:    Type:   Image     Comment:   External Document

## 2011-01-08 NOTE — Assessment & Plan Note (Signed)
Summary: NP WITH R HEEL PAIN   Vital Signs:  Patient profile:   64 year old female Height:      65 inches Weight:      230 pounds BP sitting:   118 / 62  Vitals Entered By: Lillia Pauls CMA (April 07, 2009 10:06 AM)  Primary Provider:  Jackson Latino MD   History of Present Illness: New patient here who is a 65 y/o female with chronic right heel pain (x >5 years) Pain is in her posterior heel and worse with weight bearing. She cannot walk more than 30 minutes without using a wheelchair.  She has tried many things for this including anti-inflammatories, heel cups, aspercreame, and has even been in a cast for this many years ago.  She also has swelling in the back of her heel and it is tender to touch.  Allergies: 1)  ! Morphine 2)  ! Codeine Sulfate (Codeine Sulfate) 3)  ! Darvon 4)  ! * Hyfromorphone (See Dose Form) 5)  ! Demerol 6)  ! * Tylox/percocet  Physical Exam  General:  alert, well-nourished, and overweight-appearing.   Msk:  right foot/ankle: Haglunds deformity very tender to palpation near insertion of achilles slight swelling of achilles pain with resisted plantarflexion otherwise FROM foot and ankle and no other abnormalities  brief u/s: axial view demonstartes increased tendon width 2.25 cm, increased neovascularization and haglunds deformity. the achilles is edematous near its insertion. also patchy areas of intrasubstance degeneration when compared to contralateral side.   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  Achilles is being aggravated by heel spur. Will start NTG patches. Also recommended heel lifts to take pressure off the area. eccentric exercises on 1 inch book using 2 feet. ice massage daily. f/u in 1 month  Orders: US EXTREMITY NON-VASC REAL-TIME IMG ([address]147)  Complete Medication List: 1)  Lisinopril 40 Mg Tabs (Lisinopril) .... Take 1 tablet by mouth once a day 2)   Lantus 100 Unit/ml Soln (Insulin glargine) .... Inject 90  units subcutaneously once a day 3)  Neurontin 300 Mg Caps (Gabapentin) .... Take 1 tablet in the morning and 2 tablets at bedtime 4)  Prilosec 40 Mg Cpdr (Omeprazole) .... Take 1 capsule by mouth once a day 5)  Glucophage 500 Mg Tabs (Metformin hcl) .... Take 1 tablet by mouth twice  a day 6)  Prandin 1 Mg Tabs (Repaglinide) .... Take 1 tablet by mouth three times a day 7)  Pravachol 20 Mg Tabs (Pravastatin sodium) .... Take 2 tablets by mouth once a day 8)  Furosemide 40 Mg Tabs (Furosemide) .... Take one tablet by mouth once daily 9)  Nitro-dur 0.2 Mg/hr Pt24 (Nitroglycerin) .... Apply 1/4 patch to heel in morning and leave on 12 hours  Patient Instructions: 1)  ice massage 2)  use lifts in your footwear to take pressure off your tendon 3)  start using the nitroglycerin patches regualarly 4)  do the heel exercise on the book (1 inch) everyday 5)  f/u with [place] in 1 month Prescriptions: NITRO-DUR 0.2 MG/HR PT24 (NITROGLYCERIN) apply 1/4 patch to heel in morning and leave on 12 hours  #30 x 0   Entered by:   Gabrielle Dare MD   Authorized by:   Enid Baas MD   Signed by:   Gabrielle Dare MD on 04/07/2009   Method used:   Electronically to        [facility] [address] (retail)       [phone]       [place], [place]  [address]       Ph: [phone]       Fax: [phone]   RxID:   [id number]

## 2011-01-08 NOTE — Consult Note (Signed)
Summary: [facility]   Imported By: Louretta Parma 11/08/2010 16:11:43  _____________________________________________________________________  External Attachment:    Type:   Image     Comment:   External Document  Appended Document: Liliane Bade   Diabetic Eye Exam  Procedure date:  06/15/2010  Findings:      No diabetic retinopathy.     Procedures Next Due Date:    Diabetic Eye Exam: 06/2011   Diabetic Eye Exam  Procedure date:  06/15/2010  Findings:      No diabetic retinopathy.     Procedures Next Due Date:    Diabetic Eye Exam: 06/2011

## 2011-01-08 NOTE — Letter (Signed)
Summary: [facility]   Imported By: Florinda Marker 04/11/2009 13:38:28  _____________________________________________________________________  External Attachment:    Type:   Image     Comment:   External Document

## 2011-01-08 NOTE — Assessment & Plan Note (Signed)
Summary: EST-CK/FU/MEDS/CFB   Vital Signs:  Patient Profile:   65 Years Old Female Height:     66 inches (167.64 cm) Weight:      232.2 pounds BMI:     37.61 Temp:     98.9 degrees F oral BP sitting:   117 / 69  (right arm)  Pt. in pain?   yes    Location:   RIGHT HEEL,AND HANDS    Intensity:   6    Type:       aching  Vitals Entered ByFilomena Jungling (March 14, 2008 2:48 PM) Weight > 350 lbs. Yes              Is Patient Diabetic? Yes  Nutritional Status BMI of 25 - 29 = overweight CBG Result 146  Have you ever been in a relationship where you felt threatened, hurt or afraid?No   Does patient need assistance? Functional Status Self care Ambulation Normal     PCP:  Ronda Fairly MD  Chief Complaint:  FORMS TO BE FILLED OUT.  History of Present Illness: Ms Kilbride is here to have her forms filled out for the [facility]. She does not have specific complaints  except for non specific aches and pains. She is taking her insulin regularly    Prior Medication List:  LISINOPRIL 40 MG TABS (LISINOPRIL) Take 1 tablet by mouth once a day LASIX 80 MG TABS (FUROSEMIDE) Take 1 tablet by once daily LANTUS 100 UNIT/ML SOLN (INSULIN GLARGINE) Inject 80  units subcutaneously once a day NEURONTIN 300 MG CAPS (GABAPENTIN) Take 1 tablet in the morning and 2 tablets at bedtime PRILOSEC 40 MG CPDR (OMEPRAZOLE) Take 1 capsule by mouth once a day VYTORIN 10-40 MG TABS (EZETIMIBE-SIMVASTATIN) started 01/21/06 GLUCOPHAGE 500 MG TABS (METFORMIN HCL) Take 1 tablet by mouth once a day PRANDIN 1 MG  TABS (REPAGLINIDE) Take 1 tablet by mouth three times a day   Updated Prior Medication List: LISINOPRIL 40 MG TABS (LISINOPRIL) Take 1 tablet by mouth once a day LASIX 80 MG TABS (FUROSEMIDE) Take 1 tablet by once daily LANTUS 100 UNIT/ML SOLN (INSULIN GLARGINE) Inject 80  units subcutaneously once a day NEURONTIN 300 MG CAPS (GABAPENTIN) Take 1 tablet in the morning and 2 tablets at bedtime PRILOSEC 40  MG CPDR (OMEPRAZOLE) Take 1 capsule by mouth once a day VYTORIN 10-40 MG TABS (EZETIMIBE-SIMVASTATIN) started 01/21/06 GLUCOPHAGE 500 MG TABS (METFORMIN HCL) Take 1 tablet by mouth once a day PRANDIN 1 MG  TABS (REPAGLINIDE) Take 1 tablet by mouth three times a day  Current Allergies: ! MORPHINE ! CODEINE SULFATE (CODEINE SULFATE) ! DARVON ! * HYFROMORPHONE (SEE DOSE FORM) ! DEMEROL ! * TYLOX/PERCOCET  Past Medical History:    Reviewed history from 06/18/2007 and no changes required:       Venous insufficiency       Hypertension       GERD       Diabetes mellitus, type II       Peripheral neuropathy       Depression       Hyperlipidemia    Risk Factors: Tobacco use:  never Alcohol use:  no Exercise:  no Seatbelt use:  100 %   Review of Systems  The patient denies anorexia, fever, weight loss, weight gain, vision loss, decreased hearing, hoarseness, chest pain, syncope, dyspnea on exhertion, peripheral edema, prolonged cough, hemoptysis, abdominal pain, melena, hematochezia, severe indigestion/heartburn, and hematuria.     Physical Exam  General:     alert.   Head:     normocephalic.   Eyes:     vision grossly intact.   Mouth:     good dentition.   Neck:     supple.   Lungs:     normal respiratory effort, no intercostal retractions, no accessory muscle use, and normal breath sounds.   Heart:     normal rate, regular rhythm, no murmur, and no gallop.   Abdomen:     soft, non-tender, normal bowel sounds, no distention, no masses, no guarding, no rigidity, and no rebound tenderness.   Msk:     normal ROM, no joint tenderness, no joint swelling, no joint warmth, and no redness over joints.   Neurologic:     alert & oriented X3, cranial nerves II-XII intact, strength normal in all extremities, sensation intact to light touch, sensation intact to pinprick, gait normal, and DTRs symmetrical and normal.      Impression & Recommendations:  Problem # 1:  DIABETES  MELLITUS, TYPE II (ICD-250.00) Her HbA1c indicates worsened control. She indicates that she has been taking her insulin/metformin and repaglinide. Her HbA1c was 8.9 in the previous visit. She has been under stress due to financial issues and I am concerned with adherence.She also has followed up with Jamison Neighbor in the recent past. Will increase her metformin to 500 mg BID. Will ask her to send [place] her blood sugar readings over the last 2 weeks to determine adjustments to insulin. She was counselled on adherence and regular blood sugar checks. She will also follow up with Lupita Leash  Her updated medication list for this problem includes:    Lisinopril 40 Mg Tabs (Lisinopril) .Marland Kitchen... Take 1 tablet by mouth once a day    Lantus 100 Unit/ml Soln (Insulin glargine) ..... Inject 75  units subcutaneously once a day    Glucophage 500 Mg Tabs (Metformin hcl) .Marland Kitchen... Take 1 tablet by mouth two times a day    Prandin 1 Mg Tabs (Repaglinide) .Marland Kitchen... Take 1 tablet by mouth three times a day  Orders: T- Capillary Blood Glucose ([id number]) T-Hgb A1C (in-house) ([id number])  Future Orders: T-Comprehensive Metabolic Panel ([id number]) ... 04/21/2008  Labs Reviewed: HgBA1c: 10.0 (03/14/2008)   Creat: 0.88 (09/18/2007)      Problem # 2:  HYPERLIPIDEMIA (ICD-272.4) LDL is 179. She is diabetic.Will start her on Pravachol 20 mg daily. She was advised on the side effects mainly muscle and liver problems. The following medications were removed from the medication list:    Vytorin 10-40 Mg Tabs (Ezetimibe-simvastatin) ..... Started 01/21/06  Her updated medication list for this problem includes:    Pravachol 20 Mg Tabs (Pravastatin sodium) .Marland Kitchen... Take 1 tablet by mouth once a day  Future Orders: T-Lipid Profile ([id number]) ... 03/15/2008   Problem # 3:  HYPERTENSION (ICD-401.9) Stable control Her updated medication list for this problem includes:    Lisinopril 40 Mg Tabs (Lisinopril) .Marland Kitchen... Take 1 tablet by mouth once a day     Lasix 80 Mg Tabs (Furosemide) .Marland Kitchen... Take 1 tablet by once daily  BP today: 117/69 Prior BP: 113/65 (09/18/2007)  Labs Reviewed: Creat: 0.88 (09/18/2007) Chol: 149 (06/18/2007)   HDL: 40 (06/18/2007)   LDL: 75 (06/18/2007)   TG: 169 (06/18/2007)  Future Orders: T-Comprehensive Metabolic Panel ([id number]) ... 04/21/2008   Complete Medication List: 1)  Lisinopril 40 Mg Tabs (Lisinopril) .... Take 1 tablet by mouth once a day 2)  Lasix 80 Mg Tabs (Furosemide) .... Take  1 tablet by once daily 3)  Lantus 100 Unit/ml Soln (Insulin glargine) .... Inject 75  units subcutaneously once a day 4)  Neurontin 300 Mg Caps (Gabapentin) .... Take 1 tablet in the morning and 2 tablets at bedtime 5)  Prilosec 40 Mg Cpdr (Omeprazole) .... Take 1 capsule by mouth once a day 6)  Glucophage 500 Mg Tabs (Metformin hcl) .... Take 1 tablet by mouth two times a day 7)  Prandin 1 Mg Tabs (Repaglinide) .... Take 1 tablet by mouth three times a day 8)  Pravachol 20 Mg Tabs (Pravastatin sodium) .... Take 1 tablet by mouth once a day   Patient Instructions: 1)  Please schedule a follow-up appointment in 6 months.    Prescriptions: PRAVACHOL 20 MG  TABS (PRAVASTATIN SODIUM) Take 1 tablet by mouth once a day  #31 x 3   Entered and Authorized by:   Ronda Fairly MD   Signed by:   Ronda Fairly MD on 03/18/2008   Method used:   Electronically sent to ...       [facility] [address]       [address]       [place], [place]  [address]       Ph: [phone]       Fax: [phone]   RxID:   [id number] GLUCOPHAGE 500 MG  TABS (METFORMIN HCL) Take 1 tablet by mouth two times a day  #62 x 3   Entered and Authorized by:   Ronda Fairly MD   Signed by:   Ronda Fairly MD on 03/18/2008   Method used:   Electronically sent to ...       [facility] [address]       [address]       [place], [place]  [address]       Ph: [phone]        Fax: [phone]   RxID:   [id number]  ]  Vital Signs:  Patient Profile:   65 Years Old Female Height:     66 inches (167.64 cm) Weight:      232.2 pounds BMI:     37.61 Temp:     98.9 degrees F oral BP sitting:   117 / 69    Location:   RIGHT HEEL,AND HANDS    Intensity:   6    Type:       aching             CBG Result 146     Laboratory Results   Blood Tests   Date/Time Recieved: March 14, 2008 3:50 PM  Date/Time Reported: ..................................................................Marland KitchenOren Beckmann  March 14, 2008 3:50 PM   HGBA1C: 10.0%   (Normal Range: Non-Diabetic - 3-6%   Control Diabetic - 6-8%) CBG Random: 146

## 2011-01-08 NOTE — Progress Notes (Signed)
Summary: Refill/gh  Phone Note Refill Request Message from:  Fax from Pharmacy on August 17, 2009 9:42 AM  Refills Requested: Medication #1:  NITRO-DUR 0.2 MG/HR PT24 apply 1/4 patch to heel in morning and leave on 24 hours  Medication #2:  PRANDIN 1 MG  TABS Take 1 tablet by mouth three times a day  Method Requested: Fax to Local Pharmacy  Follow-up for Phone Call        Rx faxed to pharmacy Follow-up by: Angelina Ok RN,  August 17, 2009 1:41 PM    Prescriptions: NITRO-DUR 0.2 MG/HR PT24 (NITROGLYCERIN) apply 1/4 patch to heel in morning and leave on 24 hours  #30 x 0   Entered and Authorized by:   Ulyess Mort MD   Signed by:   Ulyess Mort MD on 08/17/2009   Method used:   Telephoned to ...       [facility] (retail)       [address] [place], [place]  [address]       Ph: [phone]       Fax: [phone]   RxID:   [id number] PRANDIN 1 MG  TABS (REPAGLINIDE) Take 1 tablet by mouth three times a day  #90 x 4   Entered and Authorized by:   Ulyess Mort MD   Signed by:   Ulyess Mort MD on 08/17/2009   Method used:   Telephoned to ...       [facility] (retail)       [address] [place], [place]  [address]       Ph: [phone]       Fax: [phone]   RxID:   [id number]

## 2011-01-08 NOTE — Miscellaneous (Signed)
  Clinical Lists Changes  Medications: Changed medication from PRILOSEC 40 MG CPDR (OMEPRAZOLE) Take 1 capsule by mouth once a day to NEXIUM 40 MG CPDR (ESOMEPRAZOLE MAGNESIUM) Take 1 tablet by mouth once a day - Signed Rx of NEXIUM 40 MG CPDR (ESOMEPRAZOLE MAGNESIUM) Take 1 tablet by mouth once a day;  #30 x 11;  Signed;  Entered by: Deatra Robinson MD;  Authorized by: Deatra Robinson MD;  Method used: Electronically to [facility] [phone] [place], [place], [place]  [address], Ph: [phone], Fax: [phone]    Prescriptions: NEXIUM 40 MG CPDR (ESOMEPRAZOLE MAGNESIUM) Take 1 tablet by mouth once a day  #30 x 11   Entered and Authorized by:   Deatra Robinson MD   Signed by:   Deatra Robinson MD on 07/30/2010   Method used:   Electronically to        [facility] [address] (retail)       [phone]       [place], [place]  [address]       Ph: [phone]       Fax: [phone]   RxID:   [id number]

## 2011-01-08 NOTE — Progress Notes (Signed)
Summary: patient assistance    Prescriptions: LANTUS 100 UNIT/ML SOLN (INSULIN GLARGINE) Inject 90  units subcutaneously once a day  #2 vials x 0   Entered and Authorized by:   Chauncey Reading DO   Signed by:   Chauncey Reading DO on 02/21/2009   Method used:   Samples Given   RxID:   [id number]   Patient Assist Medication Verification: Medication: Lantus insulin Lot# [id number] Exp Date:03/2010 Tech approval:NLS

## 2011-01-08 NOTE — Progress Notes (Signed)
Summary: Patient Assistance    Prescriptions: LANTUS 100 UNIT/ML SOLN (INSULIN GLARGINE) Inject 75  units subcutaneously once a day  #3 vials x 0   Entered and Authorized by:   Concepcion Elk   Signed by:   Ronda Fairly MD on 04/04/2008   Method used:   Samples Given   RxID:   [id number]   Patient Assist Medication Verification: Medication: Lantus Insulin Lot# [id number] Exp Date:09/2009 Tech approval:NLS

## 2011-01-08 NOTE — Progress Notes (Signed)
Summary: refill/ hla  Phone Note Refill Request Message from:  Fax from Pharmacy on December 21, 2009 12:03 PM  Refills Requested: Medication #1:  LANTUS 100 UNIT/ML SOLN Inject 70  units subcutaneously once a day   Dosage confirmed as above?Dosage Confirmed   Last Refilled: 10/25 Initial call taken by: Marin Roberts RN,  December 21, 2009 12:03 PM  Follow-up for Phone Call        Rx completed in Dr. Tiajuana Amass    Prescriptions: LANTUS 100 UNIT/ML SOLN (INSULIN GLARGINE) Inject 70  units subcutaneously once a day  #1 x 6   Entered and Authorized by:   Jackson Latino MD   Signed by:   Jackson Latino MD on 12/22/2009   Method used:   Faxed to ...       [facility] (retail)       [address]       [place], [place]  [address]       Ph: [phone]       Fax: [phone]   RxID:   [id number]

## 2011-01-08 NOTE — Miscellaneous (Signed)
Summary: HIPAA Restrictions  HIPAA Restrictions   Imported By: Florinda Marker 04/11/2009 13:36:53  _____________________________________________________________________  External Attachment:    Type:   Image     Comment:   External Document

## 2011-01-08 NOTE — Assessment & Plan Note (Signed)
Summary: 2WK F/U/EST/VS   Vital Signs:  Patient profile:   65 year old female Height:      65 inches (165.10 cm) Weight:      224.2 pounds (101.91 kg) BMI:     37.44 Temp:     97.2 degrees F (36.22 degrees C) oral Pulse rate:   61 / minute BP sitting:   129 / 72  (right arm)  Vitals Entered By: Stanton Kidney Ditzler RN (Apr 11, 2009 9:00 AM) CC: Depression Is Patient Diabetic? Yes  Pain Assessment Patient in pain? yes     Location: right heel Intensity: 5 Onset of pain  long time Nutritional Status BMI of > 30 = obese Nutritional Status Detail appetite good CBG Result 134  Have you ever been in a relationship where you felt threatened, hurt or afraid?denies   Does patient need assistance? Functional Status Self care Ambulation Normal Comments 2 week FU - started med 04/07/09.   Primary Care Provider:  Jackson Latino MD  CC:  Depression.  History of Present Illness: Patient is a 65 year old with Past Medical History of Hypertension, GERD,Diabetes mellitus, type II, Peripheral neuropathy,  venous insufficiency, Depression ,Hyperlipidemia  comes today for regular follow up. She has been doing well and taking all her medications since 04/07/2009 when she got prandin. She had no CP, SOB, fever.  She occasionally had diarrhea which she thought is likely from the use of metformin, no dysuria or other complaints. She has been having excercise and healthy diet.  Depression History:      The patient denies a depressed mood most of the day and a diminished interest in her usual daily activities.         Preventive Screening-Counseling & Management     Smoking Status: never     Does Patient Exercise: no  Current Medications (verified): 1)  Lisinopril 40 Mg Tabs (Lisinopril) .... Take 1 Tablet By Mouth Once A Day 2)  Lantus 100 Unit/ml Soln (Insulin Glargine) .... Inject 70  Units Subcutaneously Once A Day 3)  Neurontin 300 Mg Caps (Gabapentin) .... Take 1 Tablet in The Morning and 2  Tablets At Bedtime 4)  Prilosec 40 Mg Cpdr (Omeprazole) .... Take 1 Capsule By Mouth Once A Day 5)  Glucophage 500 Mg  Tabs (Metformin Hcl) .... Take 1 Tablet By Mouth Twice  A Day 6)  Prandin 1 Mg  Tabs (Repaglinide) .... Take 1 Tablet By Mouth Three Times A Day 7)  Furosemide 40 Mg Tabs (Furosemide) .... Take One Tablet By Mouth Once Daily 8)  Nitro-Dur 0.2 Mg/hr Pt24 (Nitroglycerin) .... Apply 1/4 Patch To Heel in Morning and Leave On 12 Hours 9)  Novolog 100 Unit/ml Soln (Insulin Aspart) .Marland Kitchen.. 10 Units Subcutaneous Injection With Each Meal. 10)  Lipitor 20 Mg Tabs (Atorvastatin Calcium) .... Take 1 Tablet By Mouth Once A Day  Allergies: 1)  ! Morphine 2)  ! Codeine Sulfate (Codeine Sulfate) 3)  ! Darvon 4)  ! * Hyfromorphone (See Dose Form) 5)  ! Demerol 6)  ! * Tylox/percocet  Family History:    Reviewed history from 11/18/2008 and no changes required:       Parents had CHF died after age of 54s  Social History:    Reviewed history from 11/18/2008 and no changes required:       Divorced       Never Smoked       Alcohol use-no       Drug use-no  Review of Systems       See HPI  Physical Exam  General:  alert, well-developed, and well-nourished.   Head:  normocephalic, atraumatic, and no abnormalities observed.   Eyes:  vision grossly intact.   Nose:  no external deformity.   Mouth:  no gingival abnormalities and pharynx pink and moist.   Neck:  supple, full ROM, and no masses.   Lungs:  normal respiratory effort, no intercostal retractions, no accessory muscle use, and normal breath sounds.   Heart:  normal rate, regular rhythm, no murmur, and no gallop.   Abdomen:  soft, non-tender, normal bowel sounds, and no distention.   Msk:  normal ROM, no joint tenderness, no joint swelling, no joint warmth, and no redness over joints.   Extremities:  No clubbing, cyanosis, edema, or deformity noted with normal full range of motion of all joints.   Neurologic:  alert & oriented  X3, cranial nerves II-XII intact, and strength normal in all extremities.     Impression & Recommendations:  Problem # 1:  DIABETES MELLITUS, TYPE II (ICD-250.00) Assessment Comment Only Her current CBG 134 and last A1C 10.5 in 03/2009, indicating her DM is not well controlled even she has been taking lantus 90 units with other oral glycemics. She likely has insulin resistance, will decrease her lantus dose and cover with meal with Novolog 10 units with each meal while continue her other medications. Also have asked pateint to check random CBG and put in the CBG log sheet which was given to her during this visit and bring it back at next visit and she will get strips from [facility]. She will try to get Novolog from [facility] and she will continue to use lantus at 90 units before she can get Novolog. At next visit, will make further adjustment based on her CBG. Also have eye referal for eye exam as her last eye exam is more than a year ago. Marland Kitchen Her updated medication list for this problem includes:    Lisinopril 40 Mg Tabs (Lisinopril) .Marland Kitchen... Take 1 tablet by mouth once a day    Lantus 100 Unit/ml Soln (Insulin glargine) ..... Inject 70  units subcutaneously once a day    Glucophage 500 Mg Tabs (Metformin hcl) .Marland Kitchen... Take 1 tablet by mouth twice  a day    Prandin 1 Mg Tabs (Repaglinide) .Marland Kitchen... Take 1 tablet by mouth three times a day    Novolog 100 Unit/ml Soln (Insulin aspart) .Marland KitchenMarland KitchenMarland KitchenMarland Kitchen 10 units subcutaneous injection with each meal.  Orders: Fingerstick ([address]) Ophthalmology Referral (Ophthalmology)  Problem # 2:  HYPERLIPIDEMIA (ICD-272.4) Assessment: Improved Her LDL shows improvement, but still not at the goal after about 6 months of pravastatin, will try potent lipitor at low dose of 20 mg. If she tolerates it well and can increase the dose in the future.   The following medications were removed from the medication list:    Pravachol 20 Mg Tabs (Pravastatin sodium) .Marland Kitchen... Take 2 tablets by mouth  once a day Her updated medication list for this problem includes:    Lipitor 20 Mg Tabs (Atorvastatin calcium) .Marland Kitchen... Take 1 tablet by mouth once a day  Last Lipid ProfileCholesterol: 205 (03/23/2009 11:01:00 PM)HDL:  38 (03/23/2009 11:01:00 PM)LDL:  122 (03/23/2009 11:01:00 PM)Triglycerides:  Last Liver profileSGOT:  18 (03/23/2009 11:01:00 PM)SPGT:  24 (03/23/2009 11:01:00 PM)T. Bili:  0.4 (03/23/2009 11:01:00 PM)Alk Phos:  116 (03/23/2009 11:01:00 PM)  Problem # 3:  HYPERTENSION (ICD-401.9) Assessment: Improved BP 129/72, well controlled  and at the goal. Will continue the current medications.  Her updated medication list for this problem includes:    Lisinopril 40 Mg Tabs (Lisinopril) .Marland Kitchen... Take 1 tablet by mouth once a day    Furosemide 40 Mg Tabs (Furosemide) .Marland Kitchen... Take one tablet by mouth once daily  Problem # 4:  PREVENTIVE HEALTH CARE (ICD-V70.0) Assessment: Comment Only Her last Hemoccult Card was done more than a year ago and will do it at this visit. Although her mammogram is due now, but she would like not to do this time for financial reason. Will see if we can help her regarding mammogram at next visit.  Orders: T-Hemoccult Card-Multiple (take home) ([id number])  Complete Medication List: 1)  Lisinopril 40 Mg Tabs (Lisinopril) .... Take 1 tablet by mouth once a day 2)  Lantus 100 Unit/ml Soln (Insulin glargine) .... Inject 70  units subcutaneously once a day 3)  Neurontin 300 Mg Caps (Gabapentin) .... Take 1 tablet in the morning and 2 tablets at bedtime 4)  Prilosec 40 Mg Cpdr (Omeprazole) .... Take 1 capsule by mouth once a day 5)  Glucophage 500 Mg Tabs (Metformin hcl) .... Take 1 tablet by mouth twice  a day 6)  Prandin 1 Mg Tabs (Repaglinide) .... Take 1 tablet by mouth three times a day 7)  Furosemide 40 Mg Tabs (Furosemide) .... Take one tablet by mouth once daily 8)  Nitro-dur 0.2 Mg/hr Pt24 (Nitroglycerin) .... Apply 1/4 patch to heel in morning and leave on 12 hours 9)   Novolog 100 Unit/ml Soln (Insulin aspart) .Marland Kitchen.. 10 units subcutaneous injection with each meal. 10)  Lipitor 20 Mg Tabs (Atorvastatin calcium) .... Take 1 tablet by mouth once a day  Patient Instructions: 1)  Please schedule a follow-up appointment in 2 weeks. 2)  Please bring in your blood sugar log sheet at next visit. 3)  Please cut your lantus to 70 units and use novolog at 10 units with each meal. Prescriptions: NOVOLOG 100 UNIT/ML SOLN (INSULIN ASPART) 10 units subcutaneous injection with each meal.  #1 x 3   Entered and Authorized by:   Jackson Latino MD   Signed by:   Jackson Latino MD on 04/11/2009   Method used:   Print then Give to Patient   RxID:   [id number] LIPITOR 20 MG TABS (ATORVASTATIN CALCIUM) Take 1 tablet by mouth once a day  #30 x 4   Entered and Authorized by:   Jackson Latino MD   Signed by:   Jackson Latino MD on 04/11/2009   Method used:   Print then Give to Patient   RxID:   [id number]

## 2011-01-08 NOTE — Progress Notes (Signed)
Summary: refill/gg  Phone Note Refill Request  on September 11, 2007 3:51 PM  Refills Requested: Medication #1:  LASIX 80 MG TABS Take 1 tablet by mouth once a day   Last Refilled: 08/13/2007  Method Requested: electronic Initial call taken by: Merrie Roof RN,  September 11, 2007 3:51 PM  Follow-up for Phone Call        Will refill based on last note, but Lasix is not a good medication for venous insufficiency or hypertension. Follow-up by: Manning Charity MD,  September 11, 2007 4:37 PM    New/Updated Medications: LASIX 80 MG TABS (FUROSEMIDE) Take 1 tablet by mouth twice a day   Prescriptions: LASIX 80 MG TABS (FUROSEMIDE) Take 1 tablet by mouth twice a day  #62 x 0   Entered and Authorized by:   Manning Charity MD   Signed by:   Manning Charity MD on 09/11/2007   Method used:   Electronically sent to ...       [facility] [address]       [address]       [place], [place]  [address]       Ph: [phone]       Fax: [phone]   RxID:   [id number]

## 2011-01-08 NOTE — Progress Notes (Signed)
Summary: Patient Assistance-refill     reorder for Neurontin 300mg   placed today ..................................................................Marland KitchenConcepcion [name]  August 18, 2007 9:54 AM    Appended Document: Patient Assistance-refill  the order number for the above requested refill is [id number].

## 2011-01-08 NOTE — Progress Notes (Signed)
Summary: med refill/gp  Phone Note Refill Request Message from:  Fax from Pharmacy on Apr 20, 2008 9:21 AM  Refills Requested: Medication #1:  LASIX 80 MG TABS Take 1 tablet by once daily   Last Refilled: 03/25/2008  Method Requested: electronic Initial call taken by: Chinita Pester RN,  Apr 20, 2008 9:21 AM  Follow-up for Phone Call        Please also have her come for a BMET      Prescriptions: LASIX 80 MG TABS (FUROSEMIDE) Take 1 tablet by once daily  #31 x 2   Entered and Authorized by:   Ronda Fairly MD   Signed by:   Ronda Fairly MD on 04/26/2008   Method used:   Electronically sent to ...       [facility] [address]       [address]       [place], [place]  [address]       Ph: [phone]       Fax: [phone]   RxID:   [id number]     Appended Document: med refill/gp Flag sent to [facility]  for lab appt.

## 2011-01-08 NOTE — Progress Notes (Signed)
Summary: refill/gg  Phone Note Refill Request  on March 29, 2010 4:55 PM  Refills Requested: Medication #1:  NITRO-DUR 0.2 MG/HR PT24 apply 1/4 patch to heel in morning and leave on 24 hours   Dosage confirmed as above?Dosage Confirmed  Method Requested: Fax to Local Pharmacy Initial call taken by: Merrie Roof RN,  March 29, 2010 4:55 PM    Prescriptions: NITRO-DUR 0.2 MG/HR PT24 (NITROGLYCERIN) apply 1/4 patch to heel in morning and leave on 24 hours  #30 x 3   Entered and Authorized by:   Jackson Latino MD   Signed by:   Jackson Latino MD on 03/29/2010   Method used:   Faxed to ...       [facility] (retail)       [address]       [place], [place]  [address]       Ph: [phone]       Fax: [phone]   RxID:   [id number]

## 2011-01-08 NOTE — Assessment & Plan Note (Signed)
Summary: EST-CK/FU/MEDS/CFB   Vital Signs:  Patient profile:   65 year old female Height:      65 inches (165.10 cm) Weight:      235.4 pounds (107 kg) BMI:     39.31 Temp:     97.0 degrees F oral Pulse rate:   92 / minute BP sitting:   156 / 89  (right arm) Cuff size:   large  Vitals Entered By: Chinita Pester RN (November 09, 2010 4:07 PM) CC:  Check-up. Med refills. Is Patient Diabetic? Yes Did you bring your meter with you today? No Pain Assessment Patient in pain? yes     Location: left eye Intensity: 4 Type: aching Onset of pain  states sinus pain Nutritional Status BMI of > 30 = obese CBG Result 160  Have you ever been in a relationship where you felt threatened, hurt or afraid?No   Does patient need assistance? Functional Status Self care Ambulation Normal   Diabetic Foot Exam Last Podiatry Exam Date: 11/09/2010  Foot Inspection Is there a history of a foot ulcer?              No Is there a foot ulcer now?              No Can the patient see the bottom of their feet?          Yes Are the shoes appropriate in style and fit?          Yes Is there swelling or an abnormal foot shape?          Yes Are the toenails long?                No Are the toenails thick?                No Are the toenails ingrown?              No Is there heavy callous build-up?              No Is there pain in the calf muscle (Intermittent claudication) when walking?    NoIs there a claw toe deformity?              No Is there elevated skin temperature?            No Is there limited ankle dorsiflexion?            No Is there foot or ankle muscle weakness?            No  Diabetic Foot Care Education Patient educated on appropriate care of diabetic feet.  Pulse Check          Right Foot          Left Foot Dorsalis Pedis:        diminished            diminished Comments: Feet are swollwn; she has not taken her med. High Risk Feet? No   10-g (5.07) Semmes-Weinstein Monofilament  Test Performed by: Chinita Pester RN          Right Foot          Left Foot Visual Inspection     normal         normal Test Control      normal         normal Site 1         normal  normal Site 2         normal         normal Site 3         normal         normal Site 4         normal         normal Site 5         normal         normal Site 6         normal         normal Site 7         normal         normal Site 8         normal         normal Site 9         normal         normal Site 10                    normal  Impression      normal         normal   Primary Care Provider:  Jackson Latino MD  CC:   Check-up. Med refills..  History of Present Illness: Follow up appointment: 1. DM. Denies any hypoglycemia with insulin therapy. 2. HTN. Patient ran out of Lasix for 2 months. 3. HLD. Patient is unable to afford her medications. Lives on 400$/month.  Depression History:      The patient denies a depressed mood most of the day and a diminished interest in her usual daily activities.         Preventive Screening-Counseling & Management  Alcohol-Tobacco     Alcohol drinks/day: 0     Smoking Status: never  Caffeine-Diet-Exercise     Does Patient Exercise: no  Problems Prior to Update: 1)  Diabetes Mellitus, Type II  (ICD-250.00) 2)  Diabetic Peripheral Neuropathy  (ICD-250.60) 3)  Hypertension  (ICD-401.9) 4)  Hyperlipidemia  (ICD-272.4) 5)  Depression  (ICD-311) 6)  Gerd  (ICD-530.81) 7)  Preventive Health Care  (ICD-V70.0) 8)  Aftercare, Long-term Use, Medications Nec  (ICD-V58.69) 9)  Hx of Ventral Hernia, Incisional  (ICD-553.21) 10)  Hysterectomy, Hx of  (ICD-V45.77) 11)  Cholecystectomy, Hx of  (ICD-V45.79) 12)  Venous Insufficiency  (ICD-459.81) 13)  Ruq Pain  (ICD-789.01) 14)  Shoulder Pain, Right  (ICD-719.41) 15)  Achilles Tendinitis  (ICD-726.71) 16)  Calcaneal Spur  (ICD-726.73) 17)  Dizziness  (ICD-780.4) 18)  Edema Leg   (ICD-782.3)  Current Problems (verified): 1)  Diabetes Mellitus, Type II  (ICD-250.00) 2)  Diabetic Peripheral Neuropathy  (ICD-250.60) 3)  Hypertension  (ICD-401.9) 4)  Hyperlipidemia  (ICD-272.4) 5)  Depression  (ICD-311) 6)  Gerd  (ICD-530.81) 7)  Hx of Ventral Hernia, Incisional  (ICD-553.21) 8)  Hysterectomy, Hx of  (ICD-V45.77) 9)  Cholecystectomy, Hx of  (ICD-V45.79) 10)  Venous Insufficiency  (ICD-459.81) 11)  Shoulder Pain, Right  (ICD-719.41) 12)  Achilles Tendinitis  (ICD-726.71) 13)  Calcaneal Spur  (ICD-726.73) 14)  Dizziness  (ICD-780.4)  Medications Prior to Update: 1)  Lantus 100 Unit/ml Soln (Insulin Glargine) .... Inject 70  Units Subcutaneously Once A Day 2)  Neurontin 300 Mg Caps (Gabapentin) .... Take 1 Tablet in The Morning and 2 Tablets At Bedtime 3)  Nexium 40 Mg Cpdr (Esomeprazole Magnesium) .... Take 1 Tablet By Mouth Once A Day 4)  Glucophage 500 Mg  Tabs (Metformin Hcl) .Marland KitchenMarland KitchenMarland Kitchen  Take 1 Tablet By Mouth Twice  A Day 5)  Prandin 1 Mg  Tabs (Repaglinide) .... Take 1 Tablet By Mouth Three Times A Day 6)  Furosemide 40 Mg Tabs (Furosemide) .... Take One By Mouth Once Daily 7)  Novolog 100 Unit/ml Soln (Insulin Aspart) .Marland Kitchen.. 10 Units Subcutaneous Injection With Each Meal. 8)  Lipitor 20 Mg Tabs (Atorvastatin Calcium) .... Take 1 Tablet By Mouth Once A Day 9)  Lisinopril 40 Mg Tabs (Lisinopril) .... Take 1 Tablet By Mouth Once A Day 10)  Fluoxetine Hcl 10 Mg Caps (Fluoxetine Hcl) .... Take 1 Tablet By Mouth Once A Day  Current Medications (verified): 1)  Lantus 100 Unit/ml Soln (Insulin Glargine) .... Inject 70  Units Subcutaneously Once A Day 2)  Neurontin 300 Mg Caps (Gabapentin) .... Take 1 Tablet in The Morning and 2 Tablets At Bedtime 3)  Nexium 40 Mg Cpdr (Esomeprazole Magnesium) .... Take 1 Tablet By Mouth Once A Day 4)  Glucophage 500 Mg  Tabs (Metformin Hcl) .... Take 1 Tablet By Mouth Twice  A Day 5)  Prandin 1 Mg  Tabs (Repaglinide) .... Take 1 Tablet By  Mouth Three Times A Day 6)  Furosemide 40 Mg Tabs (Furosemide) .... Take One By Mouth Once Daily 7)  Novolog 100 Unit/ml Soln (Insulin Aspart) .Marland Kitchen.. 10 Units Subcutaneous Injection With Each Meal. 8)  Lipitor 20 Mg Tabs (Atorvastatin Calcium) .... Take 1 Tablet By Mouth Once A Day 9)  Lisinopril 40 Mg Tabs (Lisinopril) .... Take 1 Tablet By Mouth Once A Day 10)  Fluoxetine Hcl 10 Mg Caps (Fluoxetine Hcl) .... Take 1 Tablet By Mouth Once A Day  Allergies (verified): 1)  ! Morphine 2)  ! Codeine Sulfate (Codeine Sulfate) 3)  ! Darvon 4)  ! * Hyfromorphone (See Dose Form) 5)  ! Demerol 6)  ! * Tylox/percocet  Past History:  Past medical, surgical, family and social histories (including risk factors) reviewed, and no changes noted (except as noted below).  Past Medical History: Reviewed history from 11/18/2008 and no changes required. Venous insufficiency Hypertension GERD Diabetes mellitus, type II Peripheral neuropathy Depression Hyperlipidemia  Past Surgical History: Reviewed history from 06/18/2007 and no changes required. Cholecystectomy 2001 Hysterectomy 2001 Oophorectomy 2001  Family History: Reviewed history from 11/18/2008 and no changes required. Parents had CHF died after age of 8s  Social History: Reviewed history from 07/11/2009 and no changes required. Divorced Never Smoked Alcohol use-no Drug use-no  recently had to retire from job 2/2 limitaitons of walking  quilting is a good relaxation  Physical Exam  General:  alert, well-developed, and overweight-appearing.     Head:  normocephalic and atraumatic.   Eyes:  vision grossly intact, pupils equal, pupils round, and pupils reactive to light.   Mouth:  Oral mucosa and oropharynx without lesions or exudates.  Teeth in good repair. Neck:  supple.   no masses.   Lungs:  Normal respiratory effort, chest expands symmetrically. Lungs are clear to auscultation, no crackles or wheezes. Heart:  normal rate  and regular rhythm.  normal rate and regular rhythm.   Abdomen:  soft and non-tender.  normal bowel sounds, no distention, no guarding, no rigidity, and no hepatomegaly.   Msk:  R shoulder: No gross deformity. ROM limited to 140 flexion actively but close to 150-160 passively, 100 abduction actively.  Full ER bilaterally. strength mildly decreased empty can but 5/5 with resisted IR. no drop arm  Pulses:  normal peripheral pulses  Extremities:  no  cyanosis, clubbing or edema  Neurologic:  non focal. cranial nerves II-XII intact, strength normal in all extremities, gait normal, and DTRs symmetrical and normal.   Skin:  turgor normal, color normal, and no rashes.   Psych:  Oriented X3, memory intact for recent and remote, normally interactive, good eye contact, not anxious appearing, depressed affect.    Diabetes Management Exam:    Foot Exam (with socks and/or shoes not present):       Sensory-Monofilament:          Left foot: normal          Right foot: normal   Impression & Recommendations:  Problem # 1:  DIABETES MELLITUS, TYPE II (ICD-250.00) Assessment Improved Strongly advised to follow up with her referall for a fundoscopic exam.Samples of Lantus#2 and Novolog #1. The following medications were removed from the medication list:    Prandin 1 Mg Tabs (Repaglinide) .Marland Kitchen... Take 1 tablet by mouth three times a day    Lisinopril 40 Mg Tabs (Lisinopril) .Marland Kitchen... Take 1 tablet by mouth once a day Her updated medication list for this problem includes:    Lantus 100 Unit/ml Soln (Insulin glargine) ..... Inject 70  units subcutaneously once a day    Glucophage 500 Mg Tabs (Metformin hcl) .Marland Kitchen... Take 1 tablet by mouth twice  a day    Zestoretic 20-25 Mg Tabs (Lisinopril-hydrochlorothiazide) .Marland Kitchen... Take one tablet by mouth daily    Novolog 100 Unit/ml Soln (Insulin aspart) .Marland KitchenMarland KitchenMarland KitchenMarland Kitchen 10 units subcutaneous injection with each meal.  Orders: T- Capillary Blood Glucose (36644) T-Hgb A1C (in-house)  ([id number])  Labs Reviewed: Creat: 0.70 (04/24/2010)     Last Eye Exam: No diabetic retinopathy.    (09/06/2009) Reviewed HgBA1c results: 9.2 (04/24/2010)  7.4 (08/29/2009)  Problem # 2:  HYPERTENSION (ICD-401.9) Assessment: Deteriorated Patient was not taking her Lasix due to inabiltiy to pay her copays. will simplify her regimen with a combination Zetoretic. The following medications were removed from the medication list:    Lisinopril 40 Mg Tabs (Lisinopril) .Marland Kitchen... Take 1 tablet by mouth once a day Her updated medication list for this problem includes:    Zestoretic 20-25 Mg Tabs (Lisinopril-hydrochlorothiazide) .Marland Kitchen... Take one tablet by mouth daily  BP today: 156/89 Prior BP: 122/73 (06/12/2010)  Labs Reviewed: K+: 4.0 (04/24/2010) Creat: : 0.70 (04/24/2010)   Chol: 200 (08/29/2009)   HDL: 39 (08/29/2009)   LDL: 108 (08/29/2009)   TG: 264 (08/29/2009)  Problem # 3:  HYPERLIPIDEMIA (ICD-272.4) Assessment: Unchanged Will resastrt Lipitor. Her updated medication list for this problem includes:    Lipitor 20 Mg Tabs (Atorvastatin calcium) .Marland Kitchen... Take 1 tablet by mouth once a day  Future Orders: T-Lipid Profile ([id number]) ... 12/10/2010  Labs Reviewed: SGOT: 19 (04/24/2010)   SGPT: 24 (04/24/2010)   HDL:39 (08/29/2009), 38 (03/23/2009)  LDL:108 (08/29/2009), 122 (03/23/2009)  Chol:200 (08/29/2009), 205 (03/23/2009)  Trig:264 (08/29/2009), 227 (03/23/2009)  Problem # 4:  DEPRESSION (ICD-311) Assessment: Unchanged  The following medications were removed from the medication list:    Fluoxetine Hcl 10 Mg Caps (Fluoxetine hcl) .Marland Kitchen... Take 1 tablet by mouth once a day  Discussed treatment options, including trial of antidpressant medication. Will refer to behavioral health. Follow-up call in in 24-48 hours and recheck in 2 weeks, sooner as needed. Patient agrees to call if any worsening of symptoms or thoughts of doing harm arise. Verified that the patient has no suicidal ideation  at this time.   Complete Medication List: 1)  Lantus 100 Unit/ml Soln (  Insulin glargine) .... Inject 70  units subcutaneously once a day 2)  Neurontin 300 Mg Caps (Gabapentin) .... Take 1 tablet in the morning and 2 tablets at bedtime 3)  Nexium 40 Mg Cpdr (Esomeprazole magnesium) .... Take 1 tablet by mouth once a day 4)  Glucophage 500 Mg Tabs (Metformin hcl) .... Take 1 tablet by mouth twice  a day 5)  Zestoretic 20-25 Mg Tabs (Lisinopril-hydrochlorothiazide) .... Take one tablet by mouth daily 6)  Novolog 100 Unit/ml Soln (Insulin aspart) .Marland Kitchen.. 10 units subcutaneous injection with each meal. 7)  Lipitor 20 Mg Tabs (Atorvastatin calcium) .... Take 1 tablet by mouth once a day  Other Orders: Influenza Vaccine NON MCR (16109) Prescriptions: ZESTORETIC 20-25 MG TABS (LISINOPRIL-HYDROCHLOROTHIAZIDE) Take one tablet by mouth daily  #30 x 11   Entered and Authorized by:   Deatra Robinson MD   Signed by:   Deatra Robinson MD on 11/12/2010   Method used:   Electronically to        [facility]* (retail)       [address] [place], [place]  [address]       Ph: [phone]       Fax: [phone]   RxID:   [id number] FUROSEMIDE 40 MG TABS (FUROSEMIDE) take one by mouth once daily  #31 x 11   Entered and Authorized by:   Deatra Robinson MD   Signed by:   Deatra Robinson MD on 11/09/2010   Method used:   Electronically to        [facility]* (retail)       [address] [place], [place]  [address]       Ph: [phone]       Fax: [phone]   RxID:   [id number] LANTUS 100 UNIT/ML SOLN (INSULIN GLARGINE) Inject 70  units subcutaneously once a day  #1 x 11   Entered and Authorized by:   Deatra Robinson MD   Signed by:   Deatra Robinson MD on 11/09/2010   Method used:   Electronically to        [facility]* (retail)       [address]  [place], [place]  [address]       Ph: [phone]       Fax: [phone]   RxID:   [id number] FUROSEMIDE 40 MG TABS (FUROSEMIDE) take one by mouth once daily  #31 x 11   Entered and Authorized by:   Deatra Robinson MD   Signed by:   Deatra Robinson MD on 11/09/2010   Method used:   Electronically to        [facility] [address] (retail)       [phone]       [place], [place]  [address]       Ph: [phone]       Fax: [phone]   RxID:   [id number] LANTUS 100 UNIT/ML SOLN (INSULIN GLARGINE) Inject 70  units subcutaneously once a day  #1 x 11   Entered and Authorized by:   Deatra Robinson MD   Signed by:   Deatra Robinson MD on 11/09/2010   Method used:  Electronically to        [facility] [address] [place] [address] (retail)       [id number]       [place], [place]  [address]       Ph: [phone]       Fax: [phone]   RxID:   [id number]    Orders Added: 1)  T- Capillary Blood Glucose [82948] 2)  T-Hgb A1C (in-house) [83036QW] 3)  T-Lipid Profile [80061-22930] 4)  Est. Patient Level III [24401] 5)  Influenza Vaccine NON MCR [00028]   Immunizations Administered:  Influenza Vaccine # 1:    Vaccine Type: Fluvax Non-MCR    Site: left deltoid    Mfr: GlaxoSmithKline    Dose: 0.5 ml    Route: IM    Given by: Chinita Pester RN    Exp. Date: 06/08/2011    Lot #: [id number]    VIS given: 07/03/10 version given November 09, 2010.  Flu Vaccine Consent Questions:    Do you have a history of severe allergic reactions to this vaccine? no    Any prior history of allergic reactions to egg and/or gelatin? no    Do you have a sensitivity to the preservative Thimersol? no    Do you have a past history of Guillan-Barre Syndrome? no    Do you currently have an acute febrile illness? no    Have you ever had a severe reaction to latex? no    Vaccine information given and  explained to patient? yes    Are you currently pregnant? no   Immunizations Administered:  Influenza Vaccine # 1:    Vaccine Type: Fluvax Non-MCR    Site: left deltoid    Mfr: GlaxoSmithKline    Dose: 0.5 ml    Route: IM    Given by: Chinita Pester RN    Exp. Date: 06/08/2011    Lot #: [id number]    VIS given: 07/03/10 version given November 09, 2010.  Prevention & Chronic Care Immunizations   Influenza vaccine: Fluvax Non-MCR  (11/09/2010)   Influenza vaccine deferral: Deferred  (06/12/2010)   Influenza vaccine due: 08/09/2010    Tetanus booster: Not documented   Td booster deferral: Refused  (06/12/2010)   Tetanus booster due: 06/12/2020    Pneumococcal vaccine: Not documented   Pneumococcal vaccine deferral: Deferred  (05/02/2010)    H. zoster vaccine: Not documented   H. zoster vaccine deferral: Not available  (06/12/2010)  Colorectal Screening   Hemoccult: Not documented   Hemoccult action/deferral: Refused  (06/12/2010)    Colonoscopy: Not documented   Colonoscopy action/deferral: GI referral  (06/12/2010)   Colonoscopy due: 08/07/2010  Other Screening   Pap smear: Not documented   Pap smear action/deferral: Not indicated S/P hysterectomy  (08/29/2009)    Mammogram: ASSESSMENT: Negative - BI-RADS 1^MM DIGITAL SCREENING  (06/22/2010)   Mammogram action/deferral: Ordered  (06/12/2010)   Mammogram due: 06/05/2010    DXA bone density scan: Not documented   DXA bone density action/deferral: Ordered  (06/12/2010)   Smoking status: never  (11/09/2010)  Diabetes Mellitus   HgbA1C: 7.9  (11/09/2010)   HgbA1C action/deferral: Ordered  (11/09/2010)   Hemoglobin A1C due: 02/08/2011    Eye exam: No diabetic retinopathy.     (09/06/2009)   Diabetic eye exam action/deferral: Ophthalmology referral  (06/12/2010)   Eye exam due: 09/2010    Foot exam: yes  (11/09/2010)   Foot exam action/deferral: Do today  High risk foot: No  (11/09/2010)   Foot care education:  Done  (11/09/2010)   Foot exam due: 07/30/2007    Urine microalbumin/creatinine ratio: 23.0  (03/23/2009)   Urine microalbumin action/deferral: Ordered   Urine microalbumin/cr due: 03/23/2010    Diabetes flowsheet reviewed?: Yes   Progress toward A1C goal: Unchanged    Stage of readiness to change (diabetes management): Action  Lipids   Total Cholesterol: 200  (08/29/2009)   Lipid panel action/deferral: Lipid Panel ordered   LDL: 108  (08/29/2009)   LDL Direct: Not documented   HDL: 39  (08/29/2009)   Triglycerides: 264  (08/29/2009)   Lipid panel due: 08/29/2010    SGOT (AST): 19  (04/24/2010)   SGPT (ALT): 24  (04/24/2010)   Alkaline phosphatase: 133  (04/24/2010)   Total bilirubin: 0.7  (04/24/2010)   Liver panel due: 06/13/2011    Lipid flowsheet reviewed?: Yes   Progress toward LDL goal: Unchanged    Stage of readiness to change (lipid management): Action  Hypertension   Last Blood Pressure: 156 / 89  (11/09/2010)   Serum creatinine: 0.70  (04/24/2010)   BMP action: Ordered   Serum potassium 4.0  (04/24/2010)   Basic metabolic panel due: 12/13/2010    Hypertension flowsheet reviewed?: Yes   Progress toward BP goal: At goal    Stage of readiness to change (hypertension management): Maintenance  Self-Management Support :   Personal Goals (by the next clinic visit) :     Personal A1C goal: 7  (08/29/2009)     Personal blood pressure goal: 130/80  (08/29/2009)     Personal LDL goal: 100  (08/29/2009)    Patient will work on the following items until the next clinic visit to reach self-care goals:     Medications and monitoring: take my medicines every day, check my blood pressure, examine my feet every day  (11/09/2010)     Eating: drink diet soda or water instead of juice or soda, eat more vegetables, use fresh or frozen vegetables, eat foods that are low in salt, eat baked foods instead of fried foods, eat fruit for snacks and desserts  (11/09/2010)      Activity: take a 30 minute walk every day  (11/09/2010)    Diabetes self-management support: Written self-care plan  (11/09/2010)   Diabetes care plan printed   Last diabetes self-management training by diabetes educator: 06/13/2010    Hypertension self-management support: Written self-care plan  (11/09/2010)   Hypertension self-care plan printed.    Lipid self-management support: Written self-care plan  (11/09/2010)   Lipid self-care plan printed.   Nursing Instructions: Diabetic foot exam today   Process Orders Check Orders Results:     [facility]: Order checked:     Deatra Robinson MD NOT AUTHORIZED TO ORDER Tests Sent for requisitioning (November 12, 2010 8:34 AM):     12/10/2010: [facility] -- T-Lipid Profile [id number] (signed)     Laboratory Results   Blood Tests   Date/Time Received: November 09, 2010 4:28 PM  Date/Time Reported: Burke Keels  November 09, 2010 4:28 PM   HGBA1C: 7.9%   (Normal Range: Non-Diabetic - 3-6%   Control Diabetic - 6-8%) CBG Random:: 160mg /dL

## 2011-01-08 NOTE — Assessment & Plan Note (Signed)
Summary: SHOULDER PAIN/MJD   Vital Signs:  Patient profile:   65 year old female BP sitting:   140 / 80  Vitals Entered By: Lillia Pauls CMA (September 25, 2009 8:39 AM)  Primary Provider:  Jackson Latino MD   History of Present Illness: CC: right shoulder pain  pt with h/o DM, HTN, HLD and h/o achilles tendinitis with 2+ wk h/o shoulder pain that bothers her worse at night, to point of needing to sleep in recliner chair.  Declines injury/trauma to area.  Pain starts in shoulder and travels down right arm.  Has tried tylenol and heating pad with mild results.  Does not tolerate NSAIDs.  h/o neck issues in past.  Allergies: 1)  ! Morphine 2)  ! Codeine Sulfate (Codeine Sulfate) 3)  ! Darvon 4)  ! * Hyfromorphone (See Dose Form) 5)  ! Demerol 6)  ! * Tylox/percocet  Past History:  Past Medical History: Last updated: 11/18/2008 Venous insufficiency Hypertension GERD Diabetes mellitus, type II Peripheral neuropathy Depression Hyperlipidemia  Physical Exam  General:  alert, well-developed, well-nourished, well-hydrated, and overweight-appearing.   Msk:  Neck - negative sperlings Shoulder -  Right: no obvious deformity.  TTP throughout, mostly anterior shoulder and around [facility] joint.  Negative crossover test.  + trapezius spasm and tightness.  Decreased ROM compared to right.  + emptycan sign and pain with internal > external rotation at shoulder Left - WNL Neurologic:  neurovascularly intact.  Strength 5/5 bilaterally Additional Exam:  Consent obtained and verified. Sterile alcohol prep. Topical analgesic spray: Ethyl chloride. Joint: R shoulder Approached in typical fashion with: posterior lateral Completed without difficulty Meds: 1cc kenalog 40, 7cc lidocaine Needle: 23g 1 1/2in Aftercare instructions and Red flags advised.     Impression & Recommendations:  Problem # 1:  SHOULDER PAIN, RIGHT (ICD-719.41)  subacromial bursitis as well as some RTC tendinopathy    given Codman RIM exercises to help lessen risk as she already has some  early component of adhesive capsulitis.     Tramadol for pain.  Also did steroid injection in right shoulder.  RTC 1 month.  Her updated medication list for this problem includes:    Tramadol Hcl 50 Mg Tabs (Tramadol hcl) .Marland Kitchen... Take one by mouth two times a day as needed pain  Orders: Joint Aspirate / Injection, Large (20610)  Problem # 2:  ACHILLES TENDINITIS (ICD-726.71) will plan to repeat scan on RTC as she continues to feel much improved  Complete Medication List: 1)  Lisinopril 40 Mg Tabs (Lisinopril) .... Take 1 tablet by mouth once a day 2)  Lantus 100 Unit/ml Soln (Insulin glargine) .... Inject 70  units subcutaneously once a day 3)  Neurontin 300 Mg Caps (Gabapentin) .... Take 1 tablet in the morning and 2 tablets at bedtime 4)  Prilosec 40 Mg Cpdr (Omeprazole) .... Take 1 capsule by mouth once a day 5)  Glucophage 500 Mg Tabs (Metformin hcl) .... Take 1 tablet by mouth twice  a day 6)  Prandin 1 Mg Tabs (Repaglinide) .... Take 1 tablet by mouth three times a day 7)  Furosemide 40 Mg Tabs (Furosemide) .... Take one half tablet by mouth once daily 8)  Nitro-dur 0.2 Mg/hr Pt24 (Nitroglycerin) .... Apply 1/4 patch to heel in morning and leave on 24 hours 9)  Novolog 100 Unit/ml Soln (Insulin aspart) .Marland Kitchen.. 10 units subcutaneous injection with each meal. 10)  Lipitor 20 Mg Tabs (Atorvastatin calcium) .... Take 1 tablet by mouth once a day  11)  Tramadol Hcl 50 Mg Tabs (Tramadol hcl) .... Take one by mouth two times a day as needed pain  Patient Instructions: 1)  Return to clinic in 1 month. 2)  Do the stretching exercises provided today nightly (3 sets of 10 reps), then ice shoulder for 15 minutes for the next two nights. 3)  We've provided you with tramadol for pain to use as needed. 4)  Call clinic with questions. Prescriptions: TRAMADOL HCL 50 MG TABS (TRAMADOL HCL) take one by mouth two times a day as  needed pain  #30 x 0   Entered by:   Lillia Pauls CMA   Authorized by:   Eustaquio Boyden  MD   Signed by:   Lillia Pauls CMA on 09/25/2009   Method used:   Print then Give to Patient   RxID:   [id number] TRAMADOL HCL 50 MG TABS (TRAMADOL HCL) take one by mouth two times a day as needed pain  #30 x 0   Entered by:   Eustaquio Boyden  MD   Authorized by:   Enid Baas MD   Signed by:   Eustaquio Boyden  MD on 09/25/2009   Method used:   Print then Give to Patient   RxID:   [id number]

## 2011-01-08 NOTE — Letter (Signed)
Summary: A1C  A1C   Imported By: Florinda Marker 03/23/2009 15:28:42  _____________________________________________________________________  External Attachment:    Type:   Image     Comment:   External Document

## 2011-01-08 NOTE — Assessment & Plan Note (Signed)
Summary: FU VISIT/DS   Vital Signs:  Patient Profile:   65 Years Old Female Height:     66 inches (167.64 cm) Weight:      229.7 pounds (104.41 kg) BMI:     37.21 Temp:     97.6 degrees F (36.44 degrees C) Pulse rate:   80 / minute BP supine:   112 / 69  (right arm) BP sitting:   109 / 68  (right arm) BP standing:   96 / 57  (right arm)  Pt. in pain?   yes    Location:   rt heel    Intensity:   7    Type:       aching  Vitals Entered By: Chinita Pester RN (November 18, 2008 3:03 PM) Weight > 350 lbs. Yes              Is Patient Diabetic? Yes Did you bring your meter with you today? No Nutritional Status BMI of > 30 = obese CBG Result 184  Have you ever been in a relationship where you felt threatened, hurt or afraid?No   Does patient need assistance? Functional Status Self care Ambulation Normal     PCP:  Jackson Latino MD  Chief Complaint:  Check-up; med.refills; c/o dizziness.  History of Present Illness: Patient is a 65 yo female with PMH of type 2 DM, peripheral neuropathy, HTN, GERD and HLD coming in for regular check and refill medications with dizziness and right foot pain. The patient started to have dizziness when standing up several days ago, felt spinning, denies fall or loss of consciousness, nothing could make it change.  She also had right foot pain for about 4-5 years, intermittent, about 7/10 in severity, worse at night and tylenol could help it. She said she has been taking her medications as instructed. Her appetite was fine, no diarrhea or bloody stool, urination was as usual, no dysuria or hematuria. She denies use of alcohol or drugs.   Depression History:      The patient denies a depressed mood most of the day and a diminished interest in her usual daily activities.           Updated Prior Medication List: LISINOPRIL 40 MG TABS (LISINOPRIL) Take 1 tablet by mouth once a day LANTUS 100 UNIT/ML SOLN (INSULIN GLARGINE) Inject 90  units  subcutaneously once a day NEURONTIN 300 MG CAPS (GABAPENTIN) Take 1 tablet in the morning and 2 tablets at bedtime PRILOSEC 40 MG CPDR (OMEPRAZOLE) Take 1 capsule by mouth once a day GLUCOPHAGE 500 MG  TABS (METFORMIN HCL) Take 1 tablet by mouth one time a day PRANDIN 1 MG  TABS (REPAGLINIDE) Take 1 tablet by mouth three times a day PRAVACHOL 20 MG  TABS (PRAVASTATIN SODIUM) Take 2 tablets by mouth once a day FUROSEMIDE 40 MG TABS (FUROSEMIDE) take one tablet by mouth once daily  Current Allergies (reviewed today): ! MORPHINE ! CODEINE SULFATE (CODEINE SULFATE) ! DARVON ! * HYFROMORPHONE (SEE DOSE FORM) ! DEMEROL ! * TYLOX/PERCOCET  Past Medical History:    Reviewed history from 06/18/2007 and no changes required:       Venous insufficiency       Hypertension       GERD       Diabetes mellitus, type II       Peripheral neuropathy       Depression       Hyperlipidemia   Family History:    Reviewed history  and no changes required:       Parents had CHF died after age of 66s  Social History:    Reviewed history and no changes required:       Divorced       Never Smoked       Alcohol use-no       Drug use-no   Risk Factors:  Tobacco use:  never Drug use:  no Alcohol use:  no Exercise:  no Seatbelt use:  100 %  Family History Risk Factors:    Family History of MI in females < 55 years old:  no    Family History of MI in males < 64 years old:  no   Review of Systems       See HPI   Physical Exam  General:     alert, well-developed, and well-nourished.   Head:     normocephalic.   Eyes:     vision grossly intact, pupils equal, pupils round, and pupils reactive to light.   Neck:     supple and full ROM.   Chest Wall:     no deformities.   Lungs:     normal respiratory effort, no intercostal retractions, normal breath sounds, no dullness, no crackles, and no wheezes.   Heart:     normal rate, regular rhythm, no murmur, no rub, and no JVD.   Abdomen:      soft, non-tender, and normal bowel sounds.   Msk:     normal ROM, no joint tenderness, no joint swelling, and no joint warmth.   Extremities:     trace left pedal edema and trace right pedal edema.   Neurologic:     alert & oriented X3, cranial nerves II-XII intact, strength normal in all extremities, sensation intact to light touch, sensation intact to pinprick, and DTRs symmetrical and normal.      Impression & Recommendations:  Problem # 1:  DIZZINESS (ICD-780.4) Assessment: New Patient's dizziness with positive orthostatic vitals in the past several days is likely due to the use of antihypertensive medications including lasix 80 mg po daily and lisinopril 40 mg po daily. Other posible causes such as diabetic autonomic dysfunction and vertigo may also contribute to her dizziness. At this time we will decrease her lasix to 40 mg by mouth daily and observe if her dizziness becomes better.  Although it is better for patients to come back in a month to check her symptoms, patient is very concerned with each visit cost and has missed several visits because of worries about the cost, but she is willing to come back in 3 months and we have arranged next visit in 3 months. Have talked to patient to come back ASAP if the symptoms become worse.  Problem # 2:  HYPERLIPIDEMIA (ICD-272.4) Assessment: Unchanged Patient has HLD and reviewed her last FLP on 03/14/2008. She started to take pravastatin at 20 mg po daily since then and also has side effects of muscle pain when taking Vytorin 10-40 Mg Tabs (Ezetimibe-simvastatin) before. As she has been taking pravastatin at a low dose of 20 mg, at this time will check her FLP in the coming week and increase it to 40 mg by mouth once daily while observing side effects such as muscle pain or liver damage.  Her goal of LDL will be less than 100, the optimal less than 70 as she has DM. Refilled pravastatin.  Her updated medication list for this problem includes:  Pravachol 20 Mg Tabs (Pravastatin sodium) .Marland Kitchen... Take 2 tablets by mouth once a day  Orders: T-Lipid Profile [id number])  Labs Reviewed: Chol: 269 (03/14/2008)   HDL: 39 (03/14/2008)   LDL: 179 (03/14/2008)   TG: 254 (03/14/2008)    Problem # 3:  HYPERTENSION (ICD-401.9) Assessment: Unchanged Her HTN has been well controlled with current BP 112/96 . Will decrease her lasix from 80 mg to 40 mg by mouth once daily as she has recent dizziness and positive orthostatic vitals. Will observe her symptoms and BP, check BMET to see if any metabolic abnormalities. Refilled Lasix andlisinopril.  The following medications were removed from the medication list:    Lasix 80 Mg Tabs (Furosemide) .Marland Kitchen... Take 1 tablet by once daily  Her updated medication list for this problem includes:    Lisinopril 40 Mg Tabs (Lisinopril) .Marland Kitchen... Take 1 tablet by mouth once a day    Furosemide 40 Mg Tabs (Furosemide) .Marland Kitchen... Take one tablet by mouth once daily  Orders: T-Basic Metabolic Panel [id number])   Problem # 4:  DIABETES MELLITUS, TYPE II (ICD-250.00) Assessment: Unchanged Her current A1C 9.4 and CBG 184. Her last A1C 10, CBG ranging 150-180 on lantus 80 units at bedtime, metformin 500 mg by mouth once daily and prandin 1 mg by mouth  before each meal. As her DM  is not well controlled in terms of her A1C, at this visit will only increase her lantus to 90 units at bedtime whereas continue other medications, will check A1C at next visit in three months. Patient is very concerned with each visit cost and refused our referal to Ms. Jamison Neighbor for diabetic education, but she is willing to come back to check her DM in 3 months. She did eye exam this April and will not have eye referal this time. Refilled metformin.  Her updated medication list for this problem includes:    Lisinopril 40 Mg Tabs (Lisinopril) .Marland Kitchen... Take 1 tablet by mouth once a day    Lantus 100 Unit/ml Soln (Insulin glargine) ..... Inject 90  units  subcutaneously once a day    Glucophage 500 Mg Tabs (Metformin hcl) .Marland Kitchen... Take 1 tablet by mouth one time a day    Prandin 1 Mg Tabs (Repaglinide) .Marland Kitchen... Take 1 tablet by mouth three times a day  Orders: T-Hgb A1C (in-house) ([id number]) T- Capillary Blood Glucose (08657)  Labs Reviewed: HgBA1c: 9.4 (11/18/2008)  10.0 (03/14/2008)  Creat: 0.73 (05/13/2008)     Problem # 5:  DIABETIC PERIPHERAL NEUROPATHY (ICD-250.60) Assessment: Unchanged Her foot pain  is likely due to her diabetic peripheral neuropathy, and is responsive to tylenol. Will continue OTC tylenol and may add neurontin or TCA if symptom becomes worse.   Her updated medication list for this problem includes:    Lisinopril 40 Mg Tabs (Lisinopril) .Marland Kitchen... Take 1 tablet by mouth once a day    Lantus 100 Unit/ml Soln (Insulin glargine) ..... Inject 90  units subcutaneously once a day    Glucophage 500 Mg Tabs (Metformin hcl) .Marland Kitchen... Take 1 tablet by mouth one time a day    Prandin 1 Mg Tabs (Repaglinide) .Marland Kitchen... Take 1 tablet by mouth three times a day   Problem # 6:  PREVENTIVE HEALTH CARE (ICD-V70.0) Assessment: Comment Only We recommended screening colonoscopy and mamogram which are due at this vist, but patient refused them as concerned with the costs. She would like to consider them at next visit.   Complete Medication List: 1)  Lisinopril 40 Mg  Tabs (Lisinopril) .... Take 1 tablet by mouth once a day 2)  Lantus 100 Unit/ml Soln (Insulin glargine) .... Inject 90  units subcutaneously once a day 3)  Neurontin 300 Mg Caps (Gabapentin) .... Take 1 tablet in the morning and 2 tablets at bedtime 4)  Prilosec 40 Mg Cpdr (Omeprazole) .... Take 1 capsule by mouth once a day 5)  Glucophage 500 Mg Tabs (Metformin hcl) .... Take 1 tablet by mouth one time a day 6)  Prandin 1 Mg Tabs (Repaglinide) .... Take 1 tablet by mouth three times a day 7)  Pravachol 20 Mg Tabs (Pravastatin sodium) .... Take 2 tablets by mouth once a day 8)   Furosemide 40 Mg Tabs (Furosemide) .... Take one tablet by mouth once daily   Patient Instructions: 1)  Please schedule a follow-up appointment in 3 months. 2)  Please come to the Clinics if there is any new symptoms or become worse. 3)  We will call you about the lab results if there is any abnormalities.   Prescriptions: LISINOPRIL 40 MG TABS (LISINOPRIL) Take 1 tablet by mouth once a day  #31 x 6   Entered and Authorized by:   Jackson Latino MD   Signed by:   Jackson Latino MD on 11/18/2008   Method used:   Electronically to        [facility] [address] (retail)       [phone]       [place], [place]  [address]       Ph: [phone]       Fax: [phone]   RxID:   [id number] GLUCOPHAGE 500 MG  TABS (METFORMIN HCL) Take 1 tablet by mouth one time a day  #30 x 5   Entered and Authorized by:   Jackson Latino MD   Signed by:   Jackson Latino MD on 11/18/2008   Method used:   Electronically to        [facility] [address] (retail)       [phone]       [place], [place]  [address]       Ph: [phone]       Fax: [phone]   RxID:   [id number] PRAVACHOL 20 MG  TABS (PRAVASTATIN SODIUM) Take 2 tablets by mouth once a day  #60 x 5   Entered and Authorized by:   Jackson Latino MD   Signed by:   Jackson Latino MD on 11/18/2008   Method used:   Electronically to        [facility] [address] (retail)       [phone]       [place], [place]  [address]       Ph: [phone]       Fax: [phone]   RxID:   [id number] FUROSEMIDE 40 MG TABS (FUROSEMIDE) take one tablet by mouth once daily  #30 x 5   Entered and Authorized by:   Jackson Latino MD   Signed by:   Jackson Latino MD on 11/18/2008   Method used:   Electronically to        [facility] [address] (retail)       [phone]       [place], [place]  [address]  Ph: [phone]       Fax: [phone]   RxID:   [id number]  ] Laboratory Results   Blood Tests   Date/Time Received: November 18, 2008 3:21 PM Date/Time Reported: Alric Quan  November 18, 2008 3:21 PM  HGBA1C: 9.4%   (Normal Range: Non-Diabetic - 3-6%   Control Diabetic - 6-8%) CBG Random:: 184mg /dL         Last LDL:                                                 179 (03/14/2008 8:48:00 PM)        Diabetic Foot Exam Last Podiatry Exam Date: 11/18/2008  Foot Inspection Is there a history of a foot ulcer?              No Is there a foot ulcer now?              No Can the patient see the bottom of their feet?          Yes Are the shoes appropriate in style and fit?          Yes Is there swelling or an abnormal foot shape?          Yes Are the toenails long?                No Are the toenails thick?                No Are the toenails ingrown?              No Is there heavy callous build-up?              No Is there a claw toe deformity?                          No      Comments: Decreased senastion on monofilament test-right foot #9   10-g (5.07) Semmes-Weinstein Monofilament Test Performed by: Chinita Pester RN          Right Foot          Left Foot Visual Inspection               Test Control      normal         normal Site 1         normal         normal Site 2         normal         normal Site 3         normal         normal Site 4         normal         normal Site 5         normal         normal Site 6         normal         normal Site 7         normal         normal Site 8         normal         normal Site 9         normal  normal

## 2011-01-08 NOTE — Progress Notes (Signed)
Summary: [facility] Referral Form  [facility] Referral Form   Imported By: Marily Memos 01/24/2010 09:10:32  _____________________________________________________________________  External Attachment:    Type:   Image     Comment:   External Document

## 2011-01-08 NOTE — Progress Notes (Signed)
Summary: insulin  Phone Note Call from Patient Call back at [facility] Phone [phone]   Caller: Patient Summary of Call: almost out of lantus. cannot get in touch with [facility]. advise dher to call back tomorrow.  Initial call taken by: Jamison Neighbor,  December 07, 2007 6:23 PM    spoke with patient on Dec. 30, 08, patient will pick up Lantus today...................................................................Marland KitchenConcepcion Elk  December 08, 2007 10:10 AM

## 2011-01-08 NOTE — Assessment & Plan Note (Signed)
Summary: BP CK/LAB TESTS/BHUSAL/VS   Vital Signs:  Patient Profile:   65 Years Old Female Height:     66 inches (167.64 cm) Weight:      243.04 pounds (110.47 kg) Temp:     97.6 degrees F (36.44 degrees C) oral Pulse rate:   77 / minute BP sitting:   134 / 79  (right arm)  Pt. in pain?   no  Vitals Entered By: Angelina Ok RN (May 13, 2007 10:04 AM)              Is Patient Diabetic? Yes  Nutritional Status BMI of 25 - 29 = overweight CBG Result 136  Have you ever been in a relationship where you felt threatened, hurt or afraid?No   Does patient need assistance? Functional Status Self care Ambulation Normal  Prescriptions: GLUCOPHAGE 500 MG TABS (METFORMIN HCL) Take 1 tablet by mouth once a day  #31 x 3   Entered and Authorized by:   Peggye Pitt MD   Signed by:   Peggye Pitt MD on 05/13/2007   Method used:   Print then Give to Patient   RxID:   [id number] VYTORIN 10-40 MG TABS (EZETIMIBE-SIMVASTATIN) started 01/21/06  #31 x 3   Entered and Authorized by:   Peggye Pitt MD   Signed by:   Peggye Pitt MD on 05/13/2007   Method used:   Print then Give to Patient   RxID:   [id number] PRILOSEC 40 MG CPDR (OMEPRAZOLE) Take 1 capsule by mouth once a day  #31 x 3   Entered and Authorized by:   Peggye Pitt MD   Signed by:   Peggye Pitt MD on 05/13/2007   Method used:   Print then Give to Patient   RxID:   [id number] NEURONTIN 300 MG CAPS (GABAPENTIN) Take 1 tablet in the morning and 2 tablets at bedtime  #93 x 3   Entered and Authorized by:   Peggye Pitt MD   Signed by:   Peggye Pitt MD on 05/13/2007   Method used:   Print then Give to Patient   RxID:   [id number] LANTUS 100 UNIT/ML SOLN (INSULIN GLARGINE) Inject 85  units subcutaneously once a day  #as necessary x 0   Entered and Authorized by:   Peggye Pitt MD   Signed by:   Peggye Pitt MD on 05/13/2007   Method used:   Print then Give to Patient  RxID:   [id number] LASIX 80 MG TABS (FUROSEMIDE) Take 1 tablet by mouth once a day  #31 x 3   Entered and Authorized by:   Peggye Pitt MD   Signed by:   Peggye Pitt MD on 05/13/2007   Method used:   Print then Give to Patient   RxID:   [id number] LISINOPRIL 40 MG TABS (LISINOPRIL) Take 1 tablet by mouth once a day  #31 x 3   Entered and Authorized by:   Peggye Pitt MD   Signed by:   Peggye Pitt MD on 05/13/2007   Method used:   Print then Give to Patient   RxID:   [id number]    PCP:  Ronda Fairly MD  Chief Complaint:  Recheck.  History of Present Illness: [name] is a 65 y/o woman, patient of Dr. Benjamine Sprague, who is coming in today after Dr. Frederico Hamman had seen her 2 weeks ago for increase in LE edema and SOB as well as for hypertension. She had doubled ner lasix to 80 mg once daily  which patient says has done wonders for her edema and her BP is much improved as well. On another note, her neurontin had been increased to 2+2+2 tablets but she was unable to take them 2/2 dizziness and has backed down to 1+2 tablets. No longer feeling SOB.  Current Allergies: ! MORPHINE ! CODEINE SULFATE (CODEINE SULFATE) ! DARVON ! * HYFROMORPHONE (SEE DOSE FORM) ! * CIPROFLOXACIN ! DEMEROL ! * TYLOX/PERCOCET    Risk Factors: Tobacco use:  never   Review of Systems  The patient denies anorexia, fever, chest pain, abdominal pain, and melena.     Physical Exam  General:     Well-developed,well-nourished,in no acute distress; alert,appropriate and cooperative throughout examination overweight-appearing.   Lungs:     Normal respiratory effort, chest expands symmetrically. Lungs are clear to auscultation, no crackles or wheezes. Heart:     Normal rate and regular rhythm. S1 and S2 normal without gallop, murmur, click, rub or other extra sounds. Extremities:     1+ left pedal edema and 1+ right pedal edema, non-pitting.     Impression &  Recommendations:  Problem # 1:  SOB (ICD-786.05) Resolved.  Problem # 2:  EDEMA LEG (ICD-782.3) Improved on double dose of lasix.  Her updated medication list for this problem includes:    Lasix 80 Mg Tabs (Furosemide) .Marland Kitchen... Take 1 tablet by mouth once a day   Problem # 3:  DIABETES MELLITUS (ICD-250.00) Unable to evaluate effectiveness of increased lantus dose because pt had a new battery in her meter, which erased all of the values. I will have her return in 1 month to make adjustments if necessary. At that time, we shall also obtain an A1C.  Her updated medication list for this problem includes:    Lisinopril 40 Mg Tabs (Lisinopril) .Marland Kitchen... Take 1 tablet by mouth once a day    Lantus 100 Unit/ml Soln (Insulin glargine) ..... Inject 85  units subcutaneously once a day    Glucophage 500 Mg Tabs (Metformin hcl) .Marland Kitchen... Take 1 tablet by mouth once a day  Orders: Capillary Blood Glucose (24401) Capillary Blood Glucose (82948) Fingerstick (36416) Fingerstick (02725) Elastic Support Stocking - Knee High (L8100)   Problem # 4:  HYPERTENSION (ICD-401.9) Much improved on increased dose of lasix and lisinopril. Is almost at goal for diabetic. Will keep lasix at 80 mg daily for now. Recheck BP at return visit in 1 month.  Her updated medication list for this problem includes:    Lisinopril 40 Mg Tabs (Lisinopril) .Marland Kitchen... Take 1 tablet by mouth once a day    Lasix 80 Mg Tabs (Furosemide) .Marland Kitchen... Take 1 tablet by mouth once a day  BP today: 134/79 Prior BP: 150/84 (04/29/2007)  Labs Reviewed: Creat: 0.84 (04/29/2007) Chol: 282 (10/01/2006)   HDL: 43 (10/01/2006)   LDL: 191 (10/01/2006)   TG: 241 (10/01/2006)   Problem # 5:  VENOUS INSUFFICIENCY (ICD-459.81) Have reordered TED hose today, as her previous ones are torn.  Medications Added to Medication List This Visit: 1)  Lasix 80 Mg Tabs (Furosemide) .... Take 1 tablet by mouth once a day 2)  Neurontin 300 Mg Caps (Gabapentin) .... Take  1 tablet in the morning and 2 tablets at bedtime  Complete Medication List: 1)  Lisinopril 40 Mg Tabs (Lisinopril) .... Take 1 tablet by mouth once a day 2)  Lasix 80 Mg Tabs (Furosemide) .... Take 1 tablet by mouth once a day 3)  Lantus 100 Unit/ml Soln (Insulin glargine) .... Inject  85  units subcutaneously once a day 4)  Neurontin 300 Mg Caps (Gabapentin) .... Take 1 tablet in the morning and 2 tablets at bedtime 5)  Prilosec 40 Mg Cpdr (Omeprazole) .... Take 1 capsule by mouth once a day 6)  Vytorin 10-40 Mg Tabs (Ezetimibe-simvastatin) .... Started 01/21/06 7)  Glucophage 500 Mg Tabs (Metformin hcl) .... Take 1 tablet by mouth once a day   Patient Instructions: 1)  Please schedule a follow-up appointment in 1 month to see Dr. Silvestre Mesi. 2)  Bring in your blood sugar meter at that time.

## 2011-01-08 NOTE — Progress Notes (Signed)
Summary: Patient Assistance   Patient application mailed today for patient;s Prandin 1mg  and Lantus insulin. Waiting on medication to arrive.[name]   BA.,CPht II  February 21, 2009 4:38 PM

## 2011-01-08 NOTE — Progress Notes (Signed)
Summary: patient assistance  Phone Note Refill Request        Prescriptions: NEURONTIN 300 MG CAPS (GABAPENTIN) Take 1 tablet in the morning and 2 tablets at bedtime  #300 x 0   Entered and Authorized by:   Ronda Fairly MD   Signed by:   Ronda Fairly MD on 02/17/2008   Method used:   Samples Given   RxID:   [id number]

## 2011-01-08 NOTE — Assessment & Plan Note (Signed)
Summary: FU SHOULDER/MJD   Vital Signs:  Patient profile:   65 year old female BP sitting:   162 / 77  Vitals Entered By: Lillia Pauls CMA (March 06, 2010 1:52 PM)  Primary Care Provider:  Jackson Latino MD   History of Present Illness: 65 yo F here for f/u R shoulder  Rotator Cuff tendinopathy/frozen shoulder Patient feels PT has helped immensely ROM is improving - can get to  ~ 100 flexion and abduction now Still pain at nighttime and with trying overhead movements Last cortisone shot 6 weeks ago at last visit Compliant with home exercises Using nitro  ~ 10 weeks now but has not noticed much improvement with these.  Allergies (verified): 1)  ! Morphine 2)  ! Codeine Sulfate (Codeine Sulfate) 3)  ! Darvon 4)  ! * Hyfromorphone (See Dose Form) 5)  ! Demerol 6)  ! * Tylox/percocet  Physical Exam  General:  alert, well-developed, well-nourished, and well-hydrated.   Msk:  R shoulder: No gross deformity. ROM limited to 140 flexion actively, 100 abduction actively - passively can get to 100 degrees abduction.  Full ER bilaterally. strength mildly decreased empty can and resisted internal rotation. no drop arm + hawkins and neers   Impression & Recommendations:  Problem # 1:  SHOULDER PAIN, RIGHT (ICD-719.41) Assessment Improved Continue with PT, home exercises.  Nitro patches for 2 more weeks to complete 3 months treatment.  Will remove tramadol from med list - has not tried taking this.  Has a sling at home she can use when walking long distances.  To take NSAID at bedtime only to help with pain in middle of night.  F/u in 6 weeks - consider repeat intraarticular injection at that time.  The following medications were removed from the medication list:    Tramadol Hcl 50 Mg Tabs (Tramadol hcl) .Marland Kitchen... Take one by mouth two times a day as needed pain  Complete Medication List: 1)  Lisinopril 40 Mg Tabs (Lisinopril) .... Take 1 tablet by mouth once a day 2)  Lantus 100  Unit/ml Soln (Insulin glargine) .... Inject 70  units subcutaneously once a day 3)  Neurontin 300 Mg Caps (Gabapentin) .... Take 1 tablet in the morning and 2 tablets at bedtime 4)  Prilosec 40 Mg Cpdr (Omeprazole) .... Take 1 capsule by mouth once a day 5)  Glucophage 500 Mg Tabs (Metformin hcl) .... Take 1 tablet by mouth twice  a day 6)  Prandin 1 Mg Tabs (Repaglinide) .... Take 1 tablet by mouth three times a day 7)  Furosemide 40 Mg Tabs (Furosemide) .... Take one by mouth once daily 8)  Nitro-dur 0.2 Mg/hr Pt24 (Nitroglycerin) .... Apply 1/4 patch to heel in morning and leave on 24 hours 9)  Novolog 100 Unit/ml Soln (Insulin aspart) .Marland Kitchen.. 10 units subcutaneous injection with each meal. 10)  Lipitor 20 Mg Tabs (Atorvastatin calcium) .... Take 1 tablet by mouth once a day  Patient Instructions: 1)  Continue with physical therapy. 2)  Try ibuprofen 200mg  2-3tabs at bedtime if this is waking you up at night. 3)  Try heating pad for 15 minutes maximum before you go to bed also. 4)  Try using a sling for support when you are out walking for extended periods of time but do not rely on this all the time - you will get more stiff if you do. 5)  Continue using the nitro patches for an additional 2 weeks (this will be 3 months of using  these). 6)  Follow up with [place] in 6 weeks - we will likely do another shot at this visit into your shoulder joint.

## 2011-01-08 NOTE — Progress Notes (Signed)
Summary: refill/gg  Phone Note Refill Request  on November 18, 2008 2:43 PM  Refills Requested: Medication #1:  LISINOPRIL 40 MG TABS Take 1 tablet by mouth once a day  Medication #2:  LASIX 80 MG TABS Take 1 tablet by once daily  Medication #3:  GLUCOPHAGE 500 MG  TABS Take 1 tablet by mouth two times a day  Medication #4:  PRAVACHOL 20 MG  TABS Take 1 tablet by mouth once a day. Pt made appointment today to be seen for routine OV   Method Requested: Electronic Initial call taken by: Merrie Roof RN,  November 18, 2008 2:43 PM  Follow-up for Phone Call        She is in the clinic. I will let the physician who sees her filll her Rx's. Follow-up by: Ned Grace MD,  November 18, 2008 2:59 PM  Additional Follow-up for Phone Call Additional follow up Details #1::        great Additional Follow-up by: Merrie Roof RN,  November 18, 2008 5:27 PM

## 2011-01-08 NOTE — Assessment & Plan Note (Signed)
Summary: DSMT/dmr              Is Patient Diabetic? Yes  CBG Device ID One Touch Ultra 2     Current Allergies: ! MORPHINE ! CODEINE SULFATE (CODEINE SULFATE) ! DARVON ! * HYFROMORPHONE (SEE DOSE FORM) ! DEMEROL ! 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 Yes On Anti-platelet Medication?     No  Long Acting  Insulin Type:Lantus  Bedtime Dose: 85 units   Currently using Insulin Pump? No  Monitoring Self monitoring blood glucose 2 times a day Measures urine ketones? No Name of Meter  One Touch Ultra 2 Wears Medical I.D. No   Carrys Food for Low Blood sugar Yes   Can you tell if your blood sugar is low? Yes  Time of Testing  Before meals  Recent Episodes of: DKA: No Hyperglycemia : No Hypoglycemia: No HHNK: No Severe Hypoglycemia : No  Other Assessment Performs daily self-foot exams: Yes     Estimated /Usual Carb Intake Breakfast # of Carbs/Grams 3=45gm Lunch # of Carbs/Grams 3=45gm Dinner # of Carbs/Grams 3=45gm Protein: adequate protein intake Fat: Appropriate fat intake Salt: appropriate sodium intake  Nutrition assessment ETOH : No How many meals per week do you eat away from home?   rarely eat away from home Who does the food shopping? You Who does the cooking?  You  Activity Limitations  Appropriate physical activity  Barriers  Physical limitations  Diabetes Disease Process Define diabetes in simple terms: Needs review/assistance State own type of diabetes: Demonstrates competency State diabetes is treated  by meal plan-exercise-medication-monitoring-education: Demonstrates competency  Medications State name-action-dose-duration-side effects-and time to take medication: Demonstrates competency State appropriate timing of food related to medication: Demonstrates competency Demonstrates/verbalizes site selection and rotation for injections Demonstrates competency State insulin adjustment guidelines: Demonstrates competency  Describe safe needle/lancet disposal: Demonstrates competency  Nutritional Management Identify what foods most often affect blood glucose: Demonstrates competency Verbalize importance of controlling food portions: Demonstrates competency State importance of spacing and not omitting meals and snacks: Regulatory affairs officer purpose and frequency of monitoring BG-ketones-HgbA1C and when to contact health care team with results: Demonstrates competency Perform glucose monitoring/ketone testing and record results correctly: Demonstrates competency State target blood glucose and HgbA1C goals: Demonstrates competency  Complications State the causes-signs and symptoms and prevention of Hyperglycemia: Demonstrates competency Explain proper treatment of hyperglycemia: Demonstrates competency State the causes- signs and symptoms and prevention of hypoglycemia: Demonstrates competency Explain proper treatment of hypoglycemia: Needs review/assistance State the relationship between blood glucose control and the development/prevention of long-term complications: Demonstrates competency State benefits-risks-and options for improving blood sugar control: Demonstrates competency State the relationship between blood pressure and lipid control in the prevention/control of cardiovascular disease: Demonstrates competency  Exercise States importance of exercise: Demonstrates competency States effect of exercise on blood glucose: Psychologist, forensic safety  measures for exercise related to diabetes: Needs review/assistance  Lifestyle changes:Goal setting and Problem solving State benefits of making appropriate lifestyle changes: Demonstrates competency Identify lifestyle behaviors that need to change: Needs review/assistance Identify risk factors that interfere  with health: Needs review/assistance Develop strategies to reduce risk factors: Needs review/assistance  Preconception care-Pregnancy-GDM management ( if applicable)                                                                      Not applicable Date of Diabetic Education: 09/28/2007    BEHAVIORAL GOALS INITIAL Incorporating appropriate nutritional management: continue to eat lower carb to decrease CBGs Monitoring blood glucose levels daily: Record blood sugar in logbook to assist yu in seeing trends        [name] seems to be in a better place than i have seen her in the past. She has a job, verbalized understanding of how to get her blood sugars lower and is doing this.  her CBGs are as follows:  10/20    138                202 10/19    131                                    167 10/18    164                                                                                                    166 10/17    181                 176 10/16    120                127                                                  124 10/15     79                 148              175                                                         198 10/14    118                                        132  Says that she cannot tolerate weight bearing activity because the pain in her feet-" Feels like they are breaking when she walks" and  cannot take all the nearontin she has been prescribed because then she cannot function.   We eliminated changes in monitoring, food and exercise to help lower her A1C. Ruthwas able to say that she wanted more help from medications. She still is poassive about her self care  wanting others to make decisions for her. We went through the oral agents that could lower her after meal blood sugars and came up with prandin as the best one for her to try.   Plan:  1- help her apply for meter assistance for [employer] as she is paying out of pocket for very expensive strips and has much pain from trying to get enough blood.  2- Discuss starting prandin 1 mg three times a day with her primary care provider and if he agrees request Rx be sento [name] as it takes severl weeks to come in.  F/Up as needed per pt as she is uninsured and verbalized that these visits add financial strain Last LDL:                                                 75 (06/18/2007 7:09:00 PM)

## 2011-01-08 NOTE — Assessment & Plan Note (Signed)
Summary: abd pain/gg    read last note   Vital Signs:  Patient profile:   65 year old female Height:      65 inches (165.10 cm) Weight:      230.4 pounds (104.73 kg) BMI:     38.48 Temp:     97.8 degrees F (36.56 degrees C) oral Pulse rate:   86 / minute BP sitting:   112 / 68  (right arm) Cuff size:   large  Vitals Entered By: Cynda Familia Duncan Dull) (Apr 24, 2010 3:32 PM) CC: pt c/o pain in abd 2-3 weeks, needs med refill [facility] Is Patient Diabetic? Yes Did you bring your meter with you today? No Pain Assessment Patient in pain? yes     Location: abdomen Intensity: 10 Type: "ripping"  Onset of pain  intermittent x 2-3 wks (pain is more constant) Nutritional Status BMI of > 30 = obese CBG Result 126  Have you ever been in a relationship where you felt threatened, hurt or afraid?No   Does patient need assistance? Functional Status Self care Ambulation Normal   Primary Care Provider:  Jackson Latino MD  CC:  pt c/o pain in abd 2-3 weeks and needs med refill sent to [facility].  History of Present Illness: She presents complaining of abdominal pain, right upper quadrant abdominal pain , worse with movement. The pain started a couple of weeks ago. Now is constant. She feels pain is like tair, 10/10, the pain is constant since yesterday morning.   She was constipated, she took  laxative, and she had huge bowel movement after she took laxative. She now has constant bowel movement. She took," phillips" on thursday. She has had diarrhea since then. She doesnt feel relieved from bowel movement. No blood in the stool, no fever.  [phone]. alternative phone number.  Depression History:      The patient denies a depressed mood most of the day and a diminished interest in her usual daily activities.         Preventive Screening-Counseling & Management  Alcohol-Tobacco     Smoking Status: never  Current Medications (verified): 1)  Accupril  40 Mg Tabs (Quinapril Hcl) .... Take 1 Tablet By Mouth Daily. 2)  Lantus 100 Unit/ml Soln (Insulin Glargine) .... Inject 70  Units Subcutaneously Once A Day 3)  Neurontin 300 Mg Caps (Gabapentin) .... Take 1 Tablet in The Morning and 2 Tablets At Bedtime 4)  Prilosec 40 Mg Cpdr (Omeprazole) .... Take 1 Capsule By Mouth Once A Day 5)  Glucophage 500 Mg  Tabs (Metformin Hcl) .... Take 1 Tablet By Mouth Twice  A Day 6)  Prandin 1 Mg  Tabs (Repaglinide) .... Take 1 Tablet By Mouth Three Times A Day 7)  Furosemide 40 Mg Tabs (Furosemide) .... Take One By Mouth Once Daily 8)  Nitro-Dur 0.2 Mg/hr Pt24 (Nitroglycerin) .... Apply 1/4 Patch To Heel in Morning and Leave On 24 Hours 9)  Novolog 100 Unit/ml Soln (Insulin Aspart) .Marland Kitchen.. 10 Units Subcutaneous Injection With Each Meal. 10)  Lipitor 20 Mg Tabs (Atorvastatin Calcium) .... Take 1 Tablet By Mouth Once A Day  Allergies: 1)  ! Morphine 2)  ! Codeine Sulfate (Codeine Sulfate) 3)  ! Darvon 4)  ! * Hyfromorphone (See Dose Form) 5)  ! Demerol 6)  ! * Tylox/percocet  Review of Systems  The patient denies fever, weight loss, chest pain, syncope, prolonged cough, headaches, melena, and hematochezia.  Physical Exam  General:  alert, well-developed, and well-nourished.   Head:  normocephalic and atraumatic.   Lungs:  normal respiratory effort, no intercostal retractions, no accessory muscle use, and normal breath sounds.   Heart:  normal rate and regular rhythm.   Abdomen:  soft, normal bowel sounds, no distention, no masses, no guarding, no rigidity, no rebound tenderness, and no abdominal hernia.  mild tenderness on palpation epigastric and RUQ.    Impression & Recommendations:  Problem # 1:  RUQ PAIN (ICD-789.01) Patient complaining of RUQ abdominal pain for 2 weeks now, getting worse since yesterday. No guarding or rigidity on physical exam. She has some diarrhea after she took laxative. Labs result normal: Lipase, lactic acid, LFT. SHe has  prior cholecystectomy. KUB was negative for obstruction. Her WBC are mildly elevated, I think this is maybe hemo-concentration, no fever. I will get CT abdomen and pelvis to rule out others pathology. She has mild elevated alkaline phosphatase, prior levels also at 130 to 120 range. Patient already on protonix. I prescribe tramadol for pain. I advised patient to call us if she developed fever or worsening pain. I also order C diff due to diarrhea although less likely.   Phone number [phone].  Orders: T-Comprehensive Metabolic Panel [id number]) T-CBC w/Diff [id number]) T-Culture, Stool ([id number]) T-Culture, C-Diff Toxin A/B ([id number]) T-Lipase ([id number]) Radiology other (Radiology Other) CT with Contrast (CT w/ contrast)  Problem # 2:  DIABETES MELLITUS, TYPE II (ICD-250.00) Patient has not been eating well. I advised her to decrease lantus to 10 units. Her HBA1c at 9. Will need to address diabetes once abdominal pain resolved or patient able to eat more.  Her updated medication list for this problem includes:    Accupril 40 Mg Tabs (Quinapril hcl) .Marland Kitchen... Take 1 tablet by mouth daily.    Lantus 100 Unit/ml Soln (Insulin glargine) ..... Inject 70  units subcutaneously once a day    Glucophage 500 Mg Tabs (Metformin hcl) .Marland Kitchen... Take 1 tablet by mouth twice  a day    Prandin 1 Mg Tabs (Repaglinide) .Marland Kitchen... Take 1 tablet by mouth three times a day    Novolog 100 Unit/ml Soln (Insulin aspart) .Marland KitchenMarland KitchenMarland KitchenMarland Kitchen 10 units subcutaneous injection with each meal.  Orders: T-Hgb A1C (in-house) ([id number]) T- Capillary Blood Glucose ([id number])  Complete Medication List: 1)  Accupril 40 Mg Tabs (Quinapril hcl) .... Take 1 tablet by mouth daily. 2)  Lantus 100 Unit/ml Soln (Insulin glargine) .... Inject 70  units subcutaneously once a day 3)  Neurontin 300 Mg Caps (Gabapentin) .... Take 1 tablet in the morning and 2 tablets at bedtime 4)  Prilosec 40 Mg Cpdr (Omeprazole) .... Take 1 capsule by mouth  once a day 5)  Glucophage 500 Mg Tabs (Metformin hcl) .... Take 1 tablet by mouth twice  a day 6)  Prandin 1 Mg Tabs (Repaglinide) .... Take 1 tablet by mouth three times a day 7)  Furosemide 40 Mg Tabs (Furosemide) .... Take one by mouth once daily 8)  Nitro-dur 0.2 Mg/hr Pt24 (Nitroglycerin) .... Apply 1/4 patch to heel in morning and leave on 24 hours 9)  Novolog 100 Unit/ml Soln (Insulin aspart) .Marland Kitchen.. 10 units subcutaneous injection with each meal. 10)  Lipitor 20 Mg Tabs (Atorvastatin calcium) .... Take 1 tablet by mouth once a day 11)  Tramadol Hcl 50 Mg Tabs (Tramadol hcl) .... Take 1 tablet every 8 hour for pain as needed.  Other Orders: T- * Misc. Laboratory test [id number])  Patient Instructions:  1)  Stop taking lasix , accupril until your next visit.  2)  Please schedule appointment follow up in 1 week. 3)  Stop taking metformin for now. 4)  The  main problem with gastroenteritis is dehydration. Drink plenty of fluids and take solids as you feel better. If you are unable to keep anything down and/or you show signs of dehydration(dry/cracked lips, lack of tears, not urinating, very sleepy), call our office. Prescriptions: TRAMADOL HCL 50 MG TABS (TRAMADOL HCL) Take 1 tablet every 8 hour for pain as needed.  #40 x 0   Entered and Authorized by:   Hartley Barefoot MD   Signed by:   Hartley Barefoot MD on 04/24/2010   Method used:   Electronically to        [facility] [address] (retail)       [phone]       [place], [place]  [address]       Ph: [phone]       Fax: [phone]   RxID:   [id number]  Process Orders Check Orders Results:     [facility]: ABN not required for this insurance Tests Sent for requisitioning (Apr 25, 2010 8:57 AM):     04/24/2010: [facility] -- T-Comprehensive Metabolic Panel [[id number]] (signed)     04/24/2010: [facility] -- T-CBC w/Diff [[id number]]  (signed)     04/24/2010: [facility] -- T-Culture, Stool [[id number]] (signed)     04/24/2010: [facility] -- T-Culture, C-Diff Toxin A/B [id number] (signed)     04/24/2010: [facility] -- T-Lipase [id number] (signed)     04/24/2010: [facility] -- T- * Misc. Laboratory test [id number] (signed)    Prevention & Chronic Care Immunizations   Influenza vaccine: Fluvax Non-MCR  (08/29/2009)    Tetanus booster: Not documented    Pneumococcal vaccine: Not documented    H. zoster vaccine: Not documented  Colorectal Screening   Hemoccult: Not documented    Colonoscopy: Not documented   Colonoscopy action/deferral: GI referral  (08/29/2009)  Other Screening   Pap smear: Not documented   Pap smear action/deferral: Not indicated S/P hysterectomy  (08/29/2009)    Mammogram: ASSESSMENT: Negative - BI-RADS 1^MM DIGITAL SCREENING  (06/05/2009)   Mammogram due: 06/05/2010    DXA bone density scan: Not documented   Smoking status: never  (04/24/2010)  Diabetes Mellitus   HgbA1C: 9.2  (04/24/2010)    Eye exam: No diabetic retinopathy.     (09/06/2009)   Eye exam due: 09/2010    Foot exam: yes  (08/29/2009)   Foot exam action/deferral: Do today   High risk foot: Yes  (04/29/2007)   Foot care education: Done  (04/29/2007)   Foot exam due: 07/30/2007    Urine microalbumin/creatinine ratio: 23.0  (03/23/2009)  Lipids   Total Cholesterol: 200  (08/29/2009)   Lipid panel action/deferral: Lipid Panel ordered   LDL: 108  (08/29/2009)   LDL Direct: Not documented   HDL: 39  (08/29/2009)   Triglycerides: 264  (08/29/2009)    SGOT (AST): 18  (03/23/2009)   SGPT (ALT): 24  (03/23/2009) CMP ordered    Alkaline phosphatase: 116  (03/23/2009)   Total bilirubin: 0.4  (03/23/2009)  Hypertension   Last Blood Pressure: 112 / 68  (04/24/2010)   Serum creatinine: 0.72  (08/29/2009)   BMP action: Ordered   Serum  potassium 4.1  (08/29/2009) CMP ordered   Self-Management Support :  Personal Goals (by the next clinic visit) :     Personal A1C goal: 7  (08/29/2009)     Personal blood pressure goal: 130/80  (08/29/2009)     Personal LDL goal: 100  (08/29/2009)    Patient will work on the following items until the next clinic visit to reach self-care goals:     Medications and monitoring: take my medicines every day  (04/24/2010)     Eating: eat more vegetables, eat foods that are low in salt, eat baked foods instead of fried foods  (04/24/2010)    Diabetes self-management support: Pre-printed educational material, Resources for patients handout  (04/24/2010)   Last diabetes self-management training by diabetes educator: 04/11/2009    Hypertension self-management support: Pre-printed educational material, Resources for patients handout  (04/24/2010)    Lipid self-management support: Education handout, Resources for patients handout  (04/24/2010)     Lipid education handout printed      Resource handout printed.  Laboratory Results   Blood Tests   Date/Time Received: Apr 24, 2010 3:55 PM  Date/Time Reported: Burke Keels  Apr 24, 2010 3:55 PM   HGBA1C: 9.2%   (Normal Range: Non-Diabetic - 3-6%   Control Diabetic - 6-8%) CBG Random:: 126mg /dL      Process Orders Check Orders Results:     [facility]: ABN not required for this insurance Tests Sent for requisitioning (Apr 25, 2010 8:57 AM):     04/24/2010: [facility] -- T-Comprehensive Metabolic Panel [[id number]] (signed)     04/24/2010: [facility] -- T-CBC w/Diff [[id number]] (signed)     04/24/2010: [facility] -- T-Culture, Stool [[id number]] (signed)     04/24/2010: [facility] -- T-Culture, C-Diff Toxin A/B [id number] (signed)     04/24/2010: [facility] -- T-Lipase [id number] (signed)     04/24/2010: [facility] -- T- * Misc. Laboratory test [phone] (signed)

## 2011-01-08 NOTE — Assessment & Plan Note (Signed)
Summary: est-ck/fu/meds/cfb   Vital Signs:  Patient profile:   65 year old female Height:      65 inches (165.10 cm) Weight:      227.5 pounds (103.41 kg) BMI:     37.99 Temp:     97.6 degrees F (36.44 degrees C) oral Pulse rate:   81 / minute BP sitting:   99 / 67  (right arm) Cuff size:   large  Vitals Entered By: Filomena Jungling NT II (August 29, 2009 12:03 PM) CC: routine f/u Is Patient Diabetic? Yes  Nutritional Status BMI of > 30 = obese CBG Result 100  Have you ever been in a relationship where you felt threatened, hurt or afraid?No   Does patient need assistance? Functional Status Self care Ambulation Normal   Diabetic Foot Exam Last Podiatry Exam Date: 11/18/2008 Foot Inspection Is there a history of a foot ulcer?              No Is there a foot ulcer now?              No Can the patient see the bottom of their feet?          Yes Are the shoes appropriate in style and fit?          Yes Is there swelling or an abnormal foot shape?          No Are the toenails long?                No Are the toenails thick?                No Are the toenails ingrown?              No Is there heavy callous build-up?              No Is there pain in the calf muscle (Intermittent claudication) when walking?    NoIs there a claw toe deformity?              No Is there elevated skin temperature?            No Is there limited ankle dorsiflexion?            No Is there foot or ankle muscle weakness?            No  Diabetic Foot Care Education    10-g (5.07) Semmes-Weinstein Monofilament Test Performed by: Filomena Jungling NT II          Right Foot          Left Foot Site 1         normal         normal Site 2         normal         normal Site 3         normal         normal Site 4         normal         normal Site 5         normal         normal Site 6         normal         normal Site 7         normal         normal Site 8         normal  normal Site 9         normal          normal  Impression      normal         normal   Primary Care Provider:  Jackson Latino MD  CC:  routine f/u.  History of Present Illness: Patient is a 65 year old with PMH of HTN, DM type II, Peripheral neuropathy,  venous insufficiency, Depression , HLD  comes today for regular follow up. She has been doing well and taking all her medications. She had no CP, SOB, fever, muscle pain.  Her CBG runs well around 100-130, good appetite,  no dysuria or other complaints. She wants to get medication prescriptions to bring to health department as required for MAP program.   Depression History:      The patient is having a depressed mood most of the day.         Preventive Screening-Counseling & Management  Alcohol-Tobacco     Smoking Status: never  Problems Prior to Update: 1)  Achilles Tendinitis  (ICD-726.71) 2)  Calcaneal Spur  (ICD-726.73) 3)  Heel Spur  (ICD-726.73) 4)  Dizziness  (ICD-780.4) 5)  Diabetes Mellitus, Type II  (ICD-250.00) 6)  Hypertension  (ICD-401.9) 7)  Hyperlipidemia  (ICD-272.4) 8)  Depression  (ICD-311) 9)  Diabetic Peripheral Neuropathy  (ICD-250.60) 10)  Gerd  (ICD-530.81) 11)  Edema Leg  (ICD-782.3) 12)  Preventive Health Care  (ICD-V70.0) 13)  Aftercare, Long-term Use, Medications Nec  (ICD-V58.69) 14)  Hx of Ventral Hernia, Incisional  (ICD-553.21) 15)  Hysterectomy, Hx of  (ICD-V45.77) 16)  Cholecystectomy, Hx of  (ICD-V45.79) 17)  Venous Insufficiency  (ICD-459.81)  Medications Prior to Update: 1)  Lisinopril 40 Mg Tabs (Lisinopril) .... Take 1 Tablet By Mouth Once A Day 2)  Lantus 100 Unit/ml Soln (Insulin Glargine) .... Inject 70  Units Subcutaneously Once A Day 3)  Neurontin 300 Mg Caps (Gabapentin) .... Take 1 Tablet in The Morning and 2 Tablets At Bedtime 4)  Prilosec 40 Mg Cpdr (Omeprazole) .... Take 1 Capsule By Mouth Once A Day 5)  Glucophage 500 Mg  Tabs (Metformin Hcl) .... Take 1 Tablet By Mouth Twice  A Day 6)  Prandin 1 Mg  Tabs  (Repaglinide) .... Take 1 Tablet By Mouth Three Times A Day 7)  Furosemide 40 Mg Tabs (Furosemide) .... Take One Tablet By Mouth Once Daily 8)  Nitro-Dur 0.2 Mg/hr Pt24 (Nitroglycerin) .... Apply 1/4 Patch To Heel in Morning and Leave On 24 Hours 9)  Novolog 100 Unit/ml Soln (Insulin Aspart) .Marland Kitchen.. 10 Units Subcutaneous Injection With Each Meal. 10)  Lipitor 20 Mg Tabs (Atorvastatin Calcium) .... Take 1 Tablet By Mouth Once A Day  Current Medications (verified): 1)  Lisinopril 40 Mg Tabs (Lisinopril) .... Take 1 Tablet By Mouth Once A Day 2)  Lantus 100 Unit/ml Soln (Insulin Glargine) .... Inject 70  Units Subcutaneously Once A Day 3)  Neurontin 300 Mg Caps (Gabapentin) .... Take 1 Tablet in The Morning and 2 Tablets At Bedtime 4)  Prilosec 40 Mg Cpdr (Omeprazole) .... Take 1 Capsule By Mouth Once A Day 5)  Glucophage 500 Mg  Tabs (Metformin Hcl) .... Take 1 Tablet By Mouth Twice  A Day 6)  Prandin 1 Mg  Tabs (Repaglinide) .... Take 1 Tablet By Mouth Three Times A Day 7)  Furosemide 40 Mg Tabs (Furosemide) .... Take One Half Tablet By Mouth Once Daily 8)  Nitro-Dur 0.2 Mg/hr Pt24 (  Nitroglycerin) .... Apply 1/4 Patch To Heel in Morning and Leave On 24 Hours 9)  Novolog 100 Unit/ml Soln (Insulin Aspart) .Marland Kitchen.. 10 Units Subcutaneous Injection With Each Meal. 10)  Lipitor 20 Mg Tabs (Atorvastatin Calcium) .... Take 1 Tablet By Mouth Once A Day  Allergies: 1)  ! Morphine 2)  ! Codeine Sulfate (Codeine Sulfate) 3)  ! Darvon 4)  ! * Hyfromorphone (See Dose Form) 5)  ! Demerol 6)  ! * Tylox/percocet  Past History:  Past Medical History: Last updated: 12/17/2008 Venous insufficiency Hypertension GERD Diabetes mellitus, type II Peripheral neuropathy Depression Hyperlipidemia  Past Surgical History: Last updated: 06/18/2007 Cholecystectomy 2001 Hysterectomy 2001 Oophorectomy 2001  Family History: Last updated: December 17, 2008 Parents had CHF died after age of 49s  Social History: Last  updated: 07/11/2009 Divorced Never Smoked Alcohol use-no Drug use-no  recently had to retire from job 2/2 limitaitons of walking  quilting is a good relaxation  Risk Factors: Exercise: no (04/25/2009)  Risk Factors: Smoking Status: never (08/29/2009)  Family History: Reviewed history from December 17, 2008 and no changes required. Parents had CHF died after age of 18s  Social History: Reviewed history from 07/11/2009 and no changes required. Divorced Never Smoked Alcohol use-no Drug use-no  recently had to retire from job 2/2 limitaitons of walking  quilting is a good relaxation  Review of Systems       See HPI  Physical Exam  General:  alert, well-developed, well-nourished, well-hydrated, and overweight-appearing.   Head:  normocephalic.   Eyes:  vision grossly intact, pupils equal, pupils round, and pupils reactive to light.   Ears:  no external deformities.   Nose:  no external erythema.   Mouth:  pharynx pink and moist.   Neck:  supple.   Chest Wall:  no deformities.   Lungs:  normal breath sounds, no crackles, and no wheezes.   Heart:  normal rate, regular rhythm, no murmur, and no JVD.   Abdomen:  soft, non-tender, normal bowel sounds, no distention, and no masses.   Msk:  normal ROM, no joint tenderness, no joint swelling, and no joint warmth.   Pulses:  2+ Extremities:  trace left pedal edema and trace right pedal edema.   Neurologic:  alert & oriented X3, cranial nerves II-XII intact, strength normal in all extremities, and sensation intact to light touch.    Diabetes Management Exam:    Foot Exam (with socks and/or shoes not present):       Sensory-Monofilament:          Left foot: normal          Right foot: normal   Impression & Recommendations:  Problem # 1:  DIABETES MELLITUS, TYPE II (ICD-250.00) Assessment Improved HerA1C much improved and CBGcontrolled very well. Will continue current treatment. She will have eye exam next week.  Her updated  medication list for this problem includes:    Lisinopril 40 Mg Tabs (Lisinopril) .Marland Kitchen... Take 1 tablet by mouth once a day    Lantus 100 Unit/ml Soln (Insulin glargine) ..... Inject 70  units subcutaneously once a day    Glucophage 500 Mg Tabs (Metformin hcl) .Marland Kitchen... Take 1 tablet by mouth twice  a day    Prandin 1 Mg Tabs (Repaglinide) .Marland Kitchen... Take 1 tablet by mouth three times a day    Novolog 100 Unit/ml Soln (Insulin aspart) .Marland KitchenMarland KitchenMarland KitchenMarland Kitchen 10 units subcutaneous injection with each meal.  Orders: T- Capillary Blood Glucose ([id number]) T-Hgb A1C (in-house) ([id number])  Labs Reviewed: Creat: 0.71 (03/23/2009)  Reviewed HgBA1c results: 7.4 (08/29/2009)  10.5 (03/23/2009)  Problem # 2:  HYPERTENSION (ICD-401.9) Assessment: Improved  Her BP at goal and will decrease lasix dose as her BP is 99/67. Will check BP at next visit.   Her updated medication list for this problem includes:    Lisinopril 40 Mg Tabs (Lisinopril) .Marland Kitchen... Take 1 tablet by mouth once a day    Furosemide 40 Mg Tabs (Furosemide) .Marland Kitchen... Take one half tablet by mouth once daily    BP today: 99/67 Prior BP: 110/60 (07/11/2009)  Labs Reviewed: K+: 4.2 (03/23/2009) Creat: : 0.71 (03/23/2009)   Chol: 205 (03/23/2009)   HDL: 38 (03/23/2009)   LDL: 122 (03/23/2009)   TG: 227 (03/23/2009)  Orders: T-Basic Metabolic Panel [phone])  Problem # 3:  HYPERLIPIDEMIA (ICD-272.4) Assessment: Improved  Will check FLP and will make change of lipitor based on her FLP result. Suggest healthy diet and weight loss. Her updated medication list for this problem includes:    Lipitor 20 Mg Tabs (Atorvastatin calcium) .Marland Kitchen... Take 1 tablet by mouth once a day  Orders: T-Lipid Profile ([id number])  Problem # 4:  PREVENTIVE HEALTH CARE (ICD-V70.0) Assessment: Comment Only  Will have GI referral for screening colonoscopy as she has never done this before.   Orders: Gastroenterology Referral (GI)  Complete Medication List: 1)  Lisinopril 40 Mg  Tabs (Lisinopril) .... Take 1 tablet by mouth once a day 2)  Lantus 100 Unit/ml Soln (Insulin glargine) .... Inject 70  units subcutaneously once a day 3)  Neurontin 300 Mg Caps (Gabapentin) .... Take 1 tablet in the morning and 2 tablets at bedtime 4)  Prilosec 40 Mg Cpdr (Omeprazole) .... Take 1 capsule by mouth once a day 5)  Glucophage 500 Mg Tabs (Metformin hcl) .... Take 1 tablet by mouth twice  a day 6)  Prandin 1 Mg Tabs (Repaglinide) .... Take 1 tablet by mouth three times a day 7)  Furosemide 40 Mg Tabs (Furosemide) .... Take one half tablet by mouth once daily 8)  Nitro-dur 0.2 Mg/hr Pt24 (Nitroglycerin) .... Apply 1/4 patch to heel in morning and leave on 24 hours 9)  Novolog 100 Unit/ml Soln (Insulin aspart) .Marland Kitchen.. 10 units subcutaneous injection with each meal. 10)  Lipitor 20 Mg Tabs (Atorvastatin calcium) .... Take 1 tablet by mouth once a day  Other Orders: Influenza Vaccine NON MCR ([id number])  Patient Instructions: 1)  Please schedule a follow-up appointment in 3-4 months. 2)  Please take all your medications as instructed.  3)  We will call you if any abnormal lab results.  4)  Please go to talk with our financial help staff Ms. Jaynee Eagles for help for your colonoscopy.  Prescriptions: LIPITOR 20 MG TABS (ATORVASTATIN CALCIUM) Take 1 tablet by mouth once a day  #30 x 6   Entered and Authorized by:   Jackson Latino MD   Signed by:   Jackson Latino MD on 08/29/2009   Method used:   Print then Give to Patient   RxID:   [id number] NOVOLOG 100 UNIT/ML SOLN (INSULIN ASPART) 10 units subcutaneous injection with each meal.  #1 x 6   Entered and Authorized by:   Jackson Latino MD   Signed by:   Jackson Latino MD on 08/29/2009   Method used:   Print then Give to Patient   RxID:   [id number] NITRO-DUR 0.2 MG/HR PT24 (NITROGLYCERIN) apply 1/4 patch to heel in morning and leave on 24 hours  #30 x 0   Entered  and Authorized by:   Jackson Latino MD   Signed by:    Jackson Latino MD on 08/29/2009   Method used:   Print then Give to Patient   RxID:   [id number] PRANDIN 1 MG  TABS (REPAGLINIDE) Take 1 tablet by mouth three times a day  #90 x 6   Entered and Authorized by:   Jackson Latino MD   Signed by:   Jackson Latino MD on 08/29/2009   Method used:   Print then Give to Patient   RxID:   [id number] GLUCOPHAGE 500 MG  TABS (METFORMIN HCL) Take 1 tablet by mouth twice  a day  #120 x 6   Entered and Authorized by:   Jackson Latino MD   Signed by:   Jackson Latino MD on 08/29/2009   Method used:   Print then Give to Patient   RxID:   [id number] PRILOSEC 40 MG CPDR (OMEPRAZOLE) Take 1 capsule by mouth once a day  #31 x 3   Entered and Authorized by:   Jackson Latino MD   Signed by:   Jackson Latino MD on 08/29/2009   Method used:   Print then Give to Patient   RxID:   [id number] NEURONTIN 300 MG CAPS (GABAPENTIN) Take 1 tablet in the morning and 2 tablets at bedtime  #90 x 6   Entered and Authorized by:   Jackson Latino MD   Signed by:   Jackson Latino MD on 08/29/2009   Method used:   Print then Give to Patient   RxID:   [id number] LANTUS 100 UNIT/ML SOLN (INSULIN GLARGINE) Inject 70  units subcutaneously once a day  #1 x 6   Entered and Authorized by:   Jackson Latino MD   Signed by:   Jackson Latino MD on 08/29/2009   Method used:   Print then Give to Patient   RxID:   [id number] LISINOPRIL 40 MG TABS (LISINOPRIL) Take 1 tablet by mouth once a day  #31 x 6   Entered and Authorized by:   Jackson Latino MD   Signed by:   Jackson Latino MD on 08/29/2009   Method used:   Print then Give to Patient   RxID:   [id number] FUROSEMIDE 40 MG TABS (FUROSEMIDE) take one half tablet by mouth once daily  #30 x 6   Entered and Authorized by:   Jackson Latino MD   Signed by:   Jackson Latino MD on 08/29/2009   Method used:   Print then Give to Patient   RxID:   [id number]   Process Orders  Check Orders Results:     [facility]: ABN not required for this insurance Tests Sent for requisitioning (August 29, 2009 4:53 PM):     08/29/2009: [facility] -- T-Lipid Profile [phone] (signed)     08/29/2009: [facility] -- T-Basic Metabolic Panel [phone] (signed)    Prevention & Chronic Care Immunizations   Influenza vaccine: Fluvax Non-MCR  (08/29/2009)    Tetanus booster: Not documented    Pneumococcal vaccine: Not documented    H. zoster vaccine: Not documented  Colorectal Screening   Hemoccult: Not documented    Colonoscopy: Not documented   Colonoscopy action/deferral: GI referral  (08/29/2009)  Other Screening   Pap smear: Not documented   Pap smear action/deferral: Not indicated S/P hysterectomy  (08/29/2009)    Mammogram: ASSESSMENT: Negative - BI-RADS 1^MM DIGITAL SCREENING  (06/05/2009)   Mammogram due: 06/05/2010    DXA bone density scan:  Not documented   Smoking status: never  (08/29/2009)  Diabetes Mellitus   HgbA1C: 7.4  (08/29/2009)    Eye exam: Not documented    Foot exam: yes  (08/29/2009)   Foot exam action/deferral: Do today   High risk foot: Yes  (04/29/2007)   Foot care education: Done  (04/29/2007)   Foot exam due: 07/30/2007    Urine microalbumin/creatinine ratio: 23.0  (03/23/2009)    Diabetes flowsheet reviewed?: Yes   Progress toward A1C goal: Improved  Lipids   Total Cholesterol: 205  (03/23/2009)   Lipid panel action/deferral: Lipid Panel ordered   LDL: 122  (03/23/2009)   LDL Direct: Not documented   HDL: 38  (03/23/2009)   Triglycerides: 227  (03/23/2009)    SGOT (AST): 18  (03/23/2009)   SGPT (ALT): 24  (03/23/2009)   Alkaline phosphatase: 116  (03/23/2009)   Total bilirubin: 0.4  (03/23/2009)    Lipid flowsheet reviewed?: Yes   Progress toward LDL goal: Improved  Hypertension   Last Blood Pressure: 99 / 67  (08/29/2009)   Serum creatinine: 0.71   (03/23/2009)   BMP action: Ordered   Serum potassium 4.2  (03/23/2009)    Hypertension flowsheet reviewed?: Yes   Progress toward BP goal: At goal  Self-Management Support :   Personal Goals (by the next clinic visit) :     Personal A1C goal: 7  (08/29/2009)     Personal blood pressure goal: 130/80  (08/29/2009)     Personal LDL goal: 100  (08/29/2009)    Patient will work on the following items until the next clinic visit to reach self-care goals:     Medications and monitoring: take my medicines every day, bring all of my medications to every visit, weigh myself weekly  (08/29/2009)     Eating: use fresh or frozen vegetables, eat foods that are low in salt, eat baked foods instead of fried foods  (08/29/2009)    Diabetes self-management support: Written self-care plan  (08/29/2009)   Diabetes care plan printed   Last diabetes self-management training by diabetes educator: 04/11/2009    Hypertension self-management support: Written self-care plan  (08/29/2009)   Hypertension self-care plan printed.    Lipid self-management support: Written self-care plan  (08/29/2009)   Lipid self-care plan printed.   Nursing Instructions: Diabetic foot exam today Give Flu vaccine today   Laboratory Results   Blood Tests   Date/Time Received: August 29, 2009 11:08 AM Date/Time Reported: Alric Quan  August 29, 2009 11:08 AM  HGBA1C: 7.4%   (Normal Range: Non-Diabetic - 3-6%   Control Diabetic - 6-8%) CBG Random:: 100mg /dL      Process Orders Check Orders Results:     [facility]: ABN not required for this insurance Tests Sent for requisitioning (August 29, 2009 4:53 PM):     08/29/2009: [facility] -- T-Lipid Profile [phone] (signed)     08/29/2009: [facility] -- T-Basic Metabolic Panel [phone] (signed)    Immunizations Administered:  Influenza Vaccine # 1:    Vaccine Type: Fluvax Non-MCR    Site: left  deltoid    Mfr: Novartis    Dose: 0.5 ml    Route: IM    Given by: Krystal Eaton (AAMA)    Exp. Date: 02/06/2010    Lot #: [id number]    VIS given: 07/02/07 version given August 29, 2009.  Flu Vaccine Consent Questions:    Do you have a history of severe allergic reactions to this vaccine?  no    Any prior history of allergic reactions to egg and/or gelatin? no    Do you have a sensitivity to the preservative Thimersol? no    Do you have a past history of Guillan-Barre Syndrome? no    Do you currently have an acute febrile illness? no    Have you ever had a severe reaction to latex? no    Vaccine information given and explained to patient? yes    Are you currently pregnant? no

## 2011-01-08 NOTE — Progress Notes (Signed)
Summary: med change/gp  Phone Note Refill Request Message from:  Fax from Pharmacy on July 20, 2010 2:53 PM  Refills Requested: Medication #1:  PRILOSEC 40 MG CPDR Take 1 capsule by mouth once a day [facility] pharmacy can provide Nexium  40mg   2 caps daily at no charge to pt. if you decide to change med.  Need new RX.   Method Requested: Telephone to Pharmacy Initial call taken by: Chinita Pester RN,  July 20, 2010 2:54 PM  Follow-up for Phone Call        Rx completed in Dr. Tiajuana Amass Follow-up by: Deatra Robinson MD,  July 23, 2010 3:28 PM    Prescriptions: PRILOSEC 40 MG CPDR (OMEPRAZOLE) Take 1 capsule by mouth once a day  #31 x 11   Entered and Authorized by:   Deatra Robinson MD   Signed by:   Deatra Robinson MD on 07/23/2010   Method used:   Faxed to ...       [facility] (retail)       [address]       [place], [place]  [address]       Ph: [phone]       Fax: [phone]   RxID:   [id number]

## 2011-01-08 NOTE — Progress Notes (Signed)
Summary: patient assistance    Prescriptions: PRANDIN 1 MG  TABS (REPAGLINIDE) Take 1 tablet by mouth three times a day  #90 x 4   Entered and Authorized by:   Concepcion Elk   BA.,CPht II   Signed by:   Chauncey Reading DO on 04/05/2009   Method used:   Samples Given   RxID:   [id number]   Patient Assist Medication Verification: Medication: Prandin 1mg  Lot# [id number] Exp Date:01/2013 Tech approval:NLS

## 2011-01-08 NOTE — Progress Notes (Signed)
Summary: phone/gg  Phone Note Call from Patient   Caller: Patient Summary of Call: Pt call with c/o pain rt side of med section.  Onset 2 weeks ago.   Pain is on and off.  Rates pain 10/10. Having diarrhea and constipation.  She has periods with clear fluid from bowels.  Will see today at 1500 Initial call taken by: Merrie Roof RN,  Apr 24, 2010 11:04 AM  Follow-up for Phone Call        perfect plan. Follow-up by: Julaine Fusi  DO,  Apr 24, 2010 2:09 PM

## 2011-01-08 NOTE — Assessment & Plan Note (Signed)
Summary: ACUTE-RUNNING OUT OF MEDS UNABLE TO SEE DR BHUSAL/CFB   Vital Signs:  Patient Profile:   65 Years Old Female Height:     66 inches Weight:      251.05 pounds Temp:     97.9 degrees F oral Pulse rate:   84 / minute BP sitting:   150 / 84  (right arm)  Pt. in pain?   yes    Location:   all over    Intensity:   5    Type:       aching  Vitals Entered By: Angelina Ok RN (Apr 29, 2007 9:39 AM)              Is Patient Diabetic? Yes  Nutritional Status BMI of 25 - 29 = overweight CBG Result 98  Does patient need assistance? Functional Status Self care Ambulation Normal   Chief Complaint:  Needs refill.  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  Patietn has refilled her medication approx 2 weeks ago and she did not notice that the Lisinopril was placed in a box instead of a bottle so she did not take it for approx one week. She restart taking it a week prior to the visit. 4 days prior to the visit she started noticing an increase in her lower extremety swelling (more than she usually has). She also states that she gained 5 lbs in 3 days. (on our scale patient's weight is the same) Patient also states that she has been SOB lately, she needs to sleep in the recliner but she is still able to walk each day 9 laps around the parking lot and the church, and she increased her exercisin over the past several months form 1 lap to 9 laps. Patient denies chest pain, palpitations, diaphoresis.  She endorses burning pain in her feet, worse in the toes, worse after walking and before bedtime.   Current Allergies: ! MORPHINE ! CODEINE SULFATE (CODEINE SULFATE) ! DARVON ! * HYFROMORPHONE (SEE DOSE FORM) ! * CIPROFLOXACIN ! DEMEROL ! * TYLOX/PERCOCET    Risk  Factors:  Tobacco use:  never   Review of Systems  The patient denies fever, hoarseness, chest pain, syncope, dyspnea on exhertion, prolonged cough, abdominal pain, and severe indigestion/heartburn.     Physical Exam  General:     overweight-appearing.   Lungs:     Normal respiratory effort, chest expands symmetrically. Lungs are clear to auscultation, no crackles or wheezes. Heart:     Normal rate and regular rhythm. S1 and S2 normal without gallop, murmur, click, rub or other extra sounds. Abdomen:     Bowel sounds positive,abdomen soft and non-tender without masses, organomegaly or hernias noted. Extremities:     2+ left pedal edema and 2+ right pedal edema non pitting.  Neurologic:     alert & oriented X3, cranial nerves II-XII intact, and gait normal.   Psych:     Oriented X3, normally interactive, good eye contact, not anxious appearing, and not depressed appearing.      Impression & Recommendations:  Problem # 1:  EDEMA LEG (ICD-782.3) Pateitn had a 2-D echo in December of last year that showed normal EF, mild increase thickness of LV wall, normal PA pressure. Per patient, she was diagnosed with chronic venous insuficiency and  she was given ted hoses to wear at home but she cannot pull them up so she has never worrned them.  Given the nonremarkable physical exam I am not concerned of heart failure as the cause of the edema, and given the normal PA pressure on the 2D echo i do not think this is a right heart problem either. I think patient has chronic venous insuficiency and ted hoses ould be beneficial.  I will increase her lasix to 80 mg by mouth once daily for cosmetic effect, especially since her BP tolerates it and I will see her next in 2 weeks for BP check and BMET.  Her updated medication list for this problem includes:    Lasix 40 Mg Tabs (Furosemide) .Marland Kitchen... Take 2 tablets once daily for 1 week  Orders: LE Venous Duplex (DVT) (DVT)   Problem # 2:  DIABETES  MELLITUS (ICD-250.00) Patient is tolerating current medications well. She has no urinary or vision problems, no chest pain. HbA1c is elevated compared to goals of care for DM control, 8.1.  Patient is currently on Actos, Metformin and Lantus. She does not tolerate higher doses of  Metformin well b/c of diarrhea.  I will stop her Actos since can cause fluid retention and contributes to patient's chief complaint. B/c the HbA1c is not at goal I will increase her Lantus to 85 Units qhs.  Unfortunately the patient has not had a meter at home and she hasn't checked the CBGs in a while. She talked to Jamison Neighbor who advised her to buy a meter for [employer]. I advised her to bring the meter with her at the next visit for medication adjustments and to schedule a formal  appointment with Jamison Neighbor.  A foot exam today showed decreased pulses and decrease monofilament feeling in 1 spot. (next foot exam needed  in 3 months)  BP is not at goal for diabetes. I advised her to increase the Lasix to 80 mg by mouth once daily for 7 days and then to go back to 40 mg daily, and I will reevaluate her in 2 weeks.  The following medications were removed from the medication list:    Actos 15 Mg Tabs (Pioglitazone hcl) .Marland Kitchen... Take one tablet daily  Her updated medication list for this problem includes:    Lisinopril 40 Mg Tabs (Lisinopril) .Marland Kitchen... Take 1 tablet by mouth once a day    Lantus 100 Unit/ml Soln (Insulin glargine) ..... Inject 85  units subcutaneously once a day    Glucophage 500 Mg Tabs (Metformin hcl) .Marland Kitchen... Take 1 tablet by mouth once a day  Orders: T- Capillary Blood Glucose (82948) T-Hgb A1C (in-house) (56213YQ)   Problem # 3:  HYPERTENSION (ICD-401.9) please see previous problem.  Her updated medication list for this problem includes:    Lisinopril 40 Mg Tabs (Lisinopril) .Marland Kitchen... Take 1 tablet by mouth once a day    Lasix 40 Mg Tabs (Furosemide) .Marland Kitchen... Take 2 tablets once daily for 1 week   Problem # 4:   SOB (ICD-786.05) It is worse when patient is lying flat and so she uses the recliner during th night for sleep. Most likely multifactorial sec to body habitus and OHS and possible OSA. Physical exam was unremakable, there were no wheezing, no crackels. Patient denies cough, fever, chils, wheezing.  She is not tachycardic and I do not think her symptoms are due to PE but given her symptoms of LE edema I will order LE venous US. The  last 2-D echo showed a normal EF and no increase in PA pressure. She denied SOB when examined.  I advised patient to continue exersicing and trying to lose wheight. I will see her in 2 weeks.  Orders: LE Venous Duplex (DVT) (DVT)   Problem # 5:  DIABETIC PERIPHERAL NEUROPATHY (ICD-250.60) I increased the Neurontin due to ongoing symptoms.   The following medications were removed from the medication list:    Actos 15 Mg Tabs (Pioglitazone hcl) .Marland Kitchen... Take one tablet daily  Her updated medication list for this problem includes:    Lisinopril 40 Mg Tabs (Lisinopril) .Marland Kitchen... Take 1 tablet by mouth once a day    Lantus 100 Unit/ml Soln (Insulin glargine) ..... Inject 85  units subcutaneously once a day    Glucophage 500 Mg Tabs (Metformin hcl) .Marland Kitchen... Take 1 tablet by mouth once a day   Medications Added to Medication List This Visit: 1)  Lasix 40 Mg Tabs (Furosemide) .... Take 2 tablets once daily for 1 week 2)  Lantus 100 Unit/ml Soln (Insulin glargine) .... Inject 85  units subcutaneously once a day 3)  Neurontin 300 Mg Caps (Gabapentin) .... Take 2 tablets in the morning, 2 tablets at lunch and 2 tablets at bedtime  Other Orders: T-Comprehensive Metabolic Panel ([id number])   Patient Instructions: 1)  Please schedule a follow-up appointment in 2 weeks for BP check and lab tests.  2)  Please bring you glucometer with you for medication adjustments. 3)  It is important that you exercise regularly at least 20 minutes 5 times a week. If you develop chest pain, have  severe difficulty breathing, or feel very tired , stop exercising immediately and seek medical attention. 4)  You need to lose weight. Consider a lower calorie diet and regular exercise.      Last LDL:                                                 191 (10/01/2006 11:07:00 AM)        Diabetic Foot Exam Foot Inspection Is there a history of a foot ulcer?              No Is there a foot ulcer now?              No Can the patient see the bottom of their feet?          No Are the shoes appropriate in style and fit?          No Is there swelling or an abnormal foot shape?          Yes Are the toenails long?                No Are the toenails thick?                No Are the toenails ingrown?              No Is there heavy callous build-up?              Yes Is there a claw toe deformity?                          No Is there elevated skin temperature?  No Is there limited ankle dorsiflexion?            No Is there foot or ankle muscle weakness?            No Do you have pain in calf while walking?           No      Diabetic Foot Care Education :Patient educated on appropriate care of diabetic feet.  Pulse Check          Right Foot          Left Foot Posterior Tibial:        1+            1+ Dorsalis Pedis:        1+            1+ Comments: Hx of heel spurs. Can see bottoms of feet with mirror High Risk Feet? Yes Set Next Diabetic Foot Exam here: 06/29/2007   10-g (5.07) Semmes-Weinstein Monofilament Test Performed by: Angelina Ok RN          Right Foot          Left Foot Test Control      normal         normal Site 1         normal         normal Site 2         normal         normal Site 3         normal         normal Site 4         normal         normal Site 5         normal         normal Site 6         normal         normal Site 7         normal          Site 8         normal          Site 9         abnormal         abnormal Site 10         normal             Laboratory Results   Blood Tests   Date/Time Recieved: Apr 29, 2007 10:10 AM  Date/Time Reported: ..................................................................Marland KitchenAlric Quan  Apr 29, 2007 10:10 AM    HGBA1C: 8.1%   (Normal Range: Non-Diabetic - 3-6%   Control Diabetic - 6-8%) CBG Random: 98

## 2011-01-08 NOTE — Progress Notes (Signed)
Summary: refill/ hla  Phone Note Refill Request Message from:  Fax from Pharmacy on December 18, 2007 8:51 AM  Refills Requested: Medication #1:  LASIX 80 MG TABS Take 1 tablet by once daily   Last Refilled: 12/11 Initial call taken by: Marin Roberts RN,  December 18, 2007 8:51 AM      Prescriptions: LASIX 80 MG TABS (FUROSEMIDE) Take 1 tablet by once daily  #31 x 2   Entered and Authorized by:   Ronda Fairly MD   Signed by:   Ronda Fairly MD on 12/18/2007   Method used:   Electronically sent to ...       [facility] [address]       [address]       [place], [place]  [address]       Ph: [phone]       Fax: [phone]   RxID:   [id number]

## 2011-01-08 NOTE — Progress Notes (Signed)
Summary: diabetes support/dmr  Phone Note Outgoing Call   Call placed by: Jamison Neighbor RD,CDE,  May 25, 2009 10:04 AM Summary of Call: freestyle meter not working. - most likely  [employer] sent her Freestyle strips not Freestyle lite strips- she will call them. says she has no trnasportation right now- not looking forward to tkaing bus since feet hurt, and needs medicine. She said she'd find a way to work this problem out. encouraged her to call social worker as needed.

## 2011-01-08 NOTE — Progress Notes (Signed)
Summary: refill/gg  Phone Note Refill Request  on February 28, 2010 3:21 PM  Refills Requested: Medication #1:  LIPITOR 20 MG TABS Take 1 tablet by mouth once a day   Dosage confirmed as above?Dosage Confirmed   Last Refilled: 12/13/2009  Method Requested: Fax to Local Pharmacy Initial call taken by: Merrie Roof RN,  February 28, 2010 3:22 PM    Prescriptions: LIPITOR 20 MG TABS (ATORVASTATIN CALCIUM) Take 1 tablet by mouth once a day  #31 x 12   Entered and Authorized by:   Jackson Latino MD   Signed by:   Jackson Latino MD on 03/01/2010   Method used:   Faxed to ...       [facility] (retail)       [address]       [place], [place]  [address]       Ph: [phone]       Fax: [phone]   RxID:   [id number]

## 2011-01-08 NOTE — Progress Notes (Signed)
  Phone Note Outgoing Call   Call placed by: Theotis Barrio NT II,  October 11, 2010 10:22 AM Call placed to: Patient Details for Reason: [facility] AP[facility] Summary of Call: SPOKE WITH PATIENT GAVE HER APP WITH [facility] / NOV 16, 011 @ 10:40AM . ALSO REMINDER LETTER WAS MAILED TO THE PATIENT.

## 2011-01-08 NOTE — Assessment & Plan Note (Signed)
Summary: diabetes/dmr   Primary Care Provider:  Jackson Latino MD   History of Present Illness: 1.F/u on DM. Patient admits noncompiance with her medications and diet due to financial strain. Reports that now has "an Orange card."states that is very motivated to get DM under controll. Denies any hypoglycemic events, dizziness, weakness or change in her vision. 2. Chronic abdominal pain --had a w/u-has an appointment at [facility] at the end of Agust to see a GI. Denies any weight loss or gain, melena, hematechezia or constipation. 3. Preventative care: "cannot afford it."  Preventive Screening-Counseling & Management  Alcohol-Tobacco     Alcohol drinks/day: 0     Smoking Status: never  Current Problems (verified): 1)  Diabetes Mellitus, Type II  (ICD-250.00) 2)  Diabetic Peripheral Neuropathy  (ICD-250.60) 3)  Hypertension  (ICD-401.9) 4)  Hyperlipidemia  (ICD-272.4) 5)  Depression  (ICD-311) 6)  Gerd  (ICD-530.81) 7)  Preventive Health Care  (ICD-V70.0) 8)  Aftercare, Long-term Use, Medications Nec  (ICD-V58.69) 9)  Hx of Ventral Hernia, Incisional  (ICD-553.21) 10)  Hysterectomy, Hx of  (ICD-V45.77) 11)  Cholecystectomy, Hx of  (ICD-V45.79) 12)  Venous Insufficiency  (ICD-459.81) 13)  Ruq Pain  (ICD-789.01) 14)  Shoulder Pain, Right  (ICD-719.41) 15)  Achilles Tendinitis  (ICD-726.71) 16)  Calcaneal Spur  (ICD-726.73) 17)  Dizziness  (ICD-780.4) 18)  Edema Leg  (ICD-782.3)  Medications Prior to Update: 1)  Lantus 100 Unit/ml Soln (Insulin Glargine) .... Inject 70  Units Subcutaneously Once A Day 2)  Neurontin 300 Mg Caps (Gabapentin) .... Take 1 Tablet in The Morning and 2 Tablets At Bedtime 3)  Prilosec 40 Mg Cpdr (Omeprazole) .... Take 1 Capsule By Mouth Once A Day 4)  Glucophage 500 Mg  Tabs (Metformin Hcl) .... Take 1 Tablet By Mouth Twice  A Day 5)  Prandin 1 Mg  Tabs (Repaglinide) .... Take 1 Tablet By Mouth Three Times A Day 6)  Furosemide 40 Mg Tabs  (Furosemide) .... Take One By Mouth Once Daily 7)  Novolog 100 Unit/ml Soln (Insulin Aspart) .Marland Kitchen.. 10 Units Subcutaneous Injection With Each Meal. 8)  Lipitor 20 Mg Tabs (Atorvastatin Calcium) .... Take 1 Tablet By Mouth Once A Day 9)  Lisinopril 40 Mg Tabs (Lisinopril) .... Take 1 Tablet By Mouth Once A Day 10)  Fluoxetine Hcl 10 Mg Caps (Fluoxetine Hcl) .... Take 1 Tablet By Mouth Once A Day  Allergies (verified): 1)  ! Morphine 2)  ! Codeine Sulfate (Codeine Sulfate) 3)  ! Darvon 4)  ! * Hyfromorphone (See Dose Form) 5)  ! Demerol 6)  ! * Tylox/percocet  Directives: 1)  Full Code   Past History:  Risk Factors: Smoking Status: never (06/12/2010)  Past medical, surgical, family and social histories (including risk factors) reviewed, and no changes noted (except as noted below).  Past Medical History: Reviewed history from 11/18/2008 and no changes required. Venous insufficiency Hypertension GERD Diabetes mellitus, type II Peripheral neuropathy Depression Hyperlipidemia  Past Surgical History: Reviewed history from 06/18/2007 and no changes required. Cholecystectomy 2001 Hysterectomy 2001 Oophorectomy 2001  Family History: Reviewed history from 11/18/2008 and no changes required. Parents had CHF died after age of 50s  Social History: Reviewed history from 07/11/2009 and no changes required. Divorced Never Smoked Alcohol use-no Drug use-no  recently had to retire from job 2/2 limitaitons of walking  quilting is a good relaxation  Review of Systems       per HPI  Physical Exam  General:  alert,  well-developed, and overweight-appearing.     Head:  normocephalic and atraumatic.   Eyes:  vision grossly intact, pupils equal, pupils round, and pupils reactive to light.   Ears:  R ear normal and L ear normal.   Nose:  no external deformity.   Mouth:  Oral mucosa and oropharynx without lesions or exudates.  Teeth in good repair. Neck:  supple.   no masses.     Lungs:  Normal respiratory effort, chest expands symmetrically. Lungs are clear to auscultation, no crackles or wheezes. Heart:  normal rate and regular rhythm.  normal rate and regular rhythm.   Abdomen:  soft and non-tender.  normal bowel sounds, no distention, no guarding, no rigidity, and no hepatomegaly.   Msk:  R shoulder: No gross deformity. ROM limited to 140 flexion actively but close to 150-160 passively, 100 abduction actively.  Full ER bilaterally. strength mildly decreased empty can but 5/5 with resisted IR. no drop arm  Pulses:  normal peripheral pulses  Extremities:  no cyanosis, clubbing or edema  Neurologic:  non focal. cranial nerves II-XII intact, strength normal in all extremities, gait normal, and DTRs symmetrical and normal.   Skin:  turgor normal, color normal, and no rashes.   Psych:  Oriented X3, memory intact for recent and remote, normally interactive, good eye contact, not anxious appearing, depressed affect.    Diabetes Management Exam:    Foot Exam (with socks and/or shoes not present):       Sensory-Pinprick/Light touch:          Left medial foot (L-4): diminished          Left dorsal foot (L-5): diminished          Left lateral foot (S-1): diminished          Right medial foot (L-4): diminished          Right dorsal foot (L-5): diminished          Right lateral foot (S-1): diminished       Sensory-Monofilament:          Left foot: diminished          Right foot: diminished       Inspection:          Left foot: normal          Right foot: normal       Nails:          Left foot: thickened          Right foot: thickened    Foot Exam by Podiatrist:       Date: 06/12/2010       Results: early diabetic findings       Done by: Denton Meek   Impression & Recommendations:  Problem # 1:  DIABETES MELLITUS, TYPE II (ICD-250.00)  Uncotnrolled due to the patient's poor financial status and inabliity to purchase her medications and food. DM, etiology, Tx,  risks , foot care, diet reiewed with the patient. Patient is to see Ms. Rhiley for DME and Ms. Hill to [employer] for any financial assistance. Patient currently has [place] "orange card." STates that will be able to purchase her medications. patient is instructed to call with any concerns or questions. Patient is to return in 6-8 weeks, fasting. Her updated medication list for this problem includes:    Lantus 100 Unit/ml Soln (Insulin glargine) ..... Inject 70  units subcutaneously once a day    Glucophage 500 Mg Tabs (Metformin hcl) .Marland Kitchen... Take  1 tablet by mouth twice  a day    Prandin 1 Mg Tabs (Repaglinide) .Marland Kitchen... Take 1 tablet by mouth three times a day    Novolog 100 Unit/ml Soln (Insulin aspart) .Marland KitchenMarland KitchenMarland KitchenMarland Kitchen 10 units subcutaneous injection with each meal.    Lisinopril 40 Mg Tabs (Lisinopril) .Marland Kitchen... Take 1 tablet by mouth once a day  Labs Reviewed: Creat: 0.70 (04/24/2010)     Last Eye Exam: No diabetic retinopathy.    (09/06/2009) Reviewed HgBA1c results: 9.2 (04/24/2010)  7.4 (08/29/2009)  Problem # 2:  HYPERTENSION (ICD-401.9)  Controlled. Her updated medication list for this problem includes:    Furosemide 40 Mg Tabs (Furosemide) .Marland Kitchen... Take one by mouth once daily    Lisinopril 40 Mg Tabs (Lisinopril) .Marland Kitchen... Take 1 tablet by mouth once a day  Prior BP: 122/73 (06/12/2010)  Labs Reviewed: K+: 4.0 (04/24/2010) Creat: : 0.70 (04/24/2010)   Chol: 200 (08/29/2009)   HDL: 39 (08/29/2009)   LDL: 108 (08/29/2009)   TG: 264 (08/29/2009)  Problem # 3:  DEPRESSION (ICD-311)  No SI/HI or mania. Her updated medication list for this problem includes:    Fluoxetine Hcl 10 Mg Caps (Fluoxetine hcl) .Marland Kitchen... Take 1 tablet by mouth once a day  Discussed treatment options, including trial of antidpressant medication. Will refer to behavioral health. Follow-up call in in 24-48 hours and recheck in 2 weeks, sooner as needed. Patient agrees to call if any worsening of symptoms or thoughts of doing harm  arise. Verified that the patient has no suicidal ideation at this time.   Problem # 4:  RUQ PAIN (ICD-789.01) Chronic. Likely 2/2 adhesions due to Hx of multiple abdominal surgeries. Patient has a f/u appointment at [facility] in August.  Problem # 5:  PREVENTIVE HEALTH CARE (ICD-V70.0) Patient refuses all preventative care due to not being able to afford it. REferred for a mammogram, bone dexa, eye exam. Has an appointment with GI at [facility]. Referrals will be set up through [facility] care assistance. program.  Complete Medication List: 1)  Lantus 100 Unit/ml Soln (Insulin glargine) .... Inject 70  units subcutaneously once a day 2)  Neurontin 300 Mg Caps (Gabapentin) .... Take 1 tablet in the morning and 2 tablets at bedtime 3)  Prilosec 40 Mg Cpdr (Omeprazole) .... Take 1 capsule by mouth once a day 4)  Glucophage 500 Mg Tabs (Metformin hcl) .... Take 1 tablet by mouth twice  a day 5)  Prandin 1 Mg Tabs (Repaglinide) .... Take 1 tablet by mouth three times a day 6)  Furosemide 40 Mg Tabs (Furosemide) .... Take one by mouth once daily 7)  Novolog 100 Unit/ml Soln (Insulin aspart) .Marland Kitchen.. 10 units subcutaneous injection with each meal. 8)  Lipitor 20 Mg Tabs (Atorvastatin calcium) .... Take 1 tablet by mouth once a day 9)  Lisinopril 40 Mg Tabs (Lisinopril) .... Take 1 tablet by mouth once a day 10)  Fluoxetine Hcl 10 Mg Caps (Fluoxetine hcl) .... Take 1 tablet by mouth once a day  Appended Document: diabetes/dmr  Diabetes Self Management Training  PCP: Deatra Robinson MD Referring MD: Threasa Beards Date diagnosed with diabetes: 12/09/1997 Diabetes Type: Type 2 insulin treated Other persons present: no Current smoking Status: never  Assessment Sources of Support: son is local.  Special needs or Barriers: expressed anger and frustration about many things including her diabetes. Agrees that depression might be a factor  Coping with Diabetes Feelings about Diabetes:  anger at times expressed, some denail also-  How often do you feel sad, tired, low interest in life and things you used to enjoy? often  Diabetes Medications:  Lipid lowering Meds? Yes Anti-platelet Meds? No Comments: reports having a difficult time affording foods she likes and should eat, has problems wiht reflux, taste    Monitoring Self monitoring blood glucose 2 times a day Name of Meter  One Touch Ultra 2 Measures urine ketones? No  Recent Episodes of: Requiring Help from another person  Hyperglycemia : Yes Hypoglycemia: No   Wears Medical I.D. No Car[employer] for Low Blood sugar Yes Can you tell if your blood sugar is low? Yes   Would you  say you are physically active: Yes Diabetes Management Education Done: 06/13/2010        Mostly provided support today. Patient is working on trying to set healthier boundaries for her self care and is doing better with this. Has some setbacks.  Diabetes Self Management Support:today. patient verbalized understanding of need for support, but not readiness to participate.  Follow-up:as needed per patient readiness

## 2011-01-08 NOTE — Progress Notes (Signed)
Summary: med refill/gp  Phone Note Refill Request Message from:  Fax from Pharmacy on June 04, 2010 12:06 PM  Refills Requested: Medication #1:  NEURONTIN 300 MG CAPS Take 1 tablet in the morning and 2 tablets at bedtime   Last Refilled: 03/25/2010  Method Requested: Fax to Local Pharmacy Initial call taken by: Chinita Pester RN,  June 04, 2010 12:06 PM  Follow-up for Phone Call        Rx completed in Dr. Tiajuana Amass Follow-up by: Deatra Robinson MD,  June 04, 2010 12:19 PM    Prescriptions: NEURONTIN 300 MG CAPS (GABAPENTIN) Take 1 tablet in the morning and 2 tablets at bedtime  #90 x 11   Entered and Authorized by:   Deatra Robinson MD   Signed by:   Deatra Robinson MD on 06/04/2010   Method used:   Faxed to ...       [facility] (retail)       [address]       [place], [place]  [address]       Ph: [phone]       Fax: [phone]   RxID:   [id number]

## 2011-01-08 NOTE — Progress Notes (Signed)
Summary: allergy/ hla  Phone Note From Pharmacy   Summary of Call: pharmacy called to alert md to codeine allergy of pt, tramadol prescription is voided and pt is told by [facility] cma per dr Sunnie Nielsen, pt will be instructed to use tylenol Initial call taken by: Marin Roberts RN,  Apr 25, 2010 9:58 AM    agree.

## 2011-01-08 NOTE — Assessment & Plan Note (Signed)
Summary: ACUTE-1 WEEK F/U PER DR REGALADO(YANG)/[facility]B   Vital Signs:  Patient profile:   65 year old female Height:      65 inches (165.10 cm) Weight:      231 pounds (105.00 kg) BMI:     38.58 Temp:     97.9 degrees F (36.61 degrees C) oral Pulse rate:   77 / minute BP sitting:   136 / 82  (right arm)  Vitals Entered By: Angelina Ok RN (May 02, 2010 1:54 PM) CC: Depression Is Patient Diabetic? Yes Did you bring your meter with you today? No Pain Assessment Patient in pain? no      Nutritional Status BMI of > 30 = obese CBG Result 169  Have you ever been in a relationship where you felt threatened, hurt or afraid?No   Does patient need assistance? Functional Status Self care Ambulation Normal Comments Still having the diarrhea occassionally.   Primary Care Provider:  Jackson Latino MD  CC:  Depression.  History of Present Illness: [name] is a 65 year old Female with PMH/problems as outlined in the EMR, who presents to the [facility] with chief complaint(s) of:    1. RUQ pain: evaluated one week ago for this pain. Had a normal CT (except her diverticulosis). She is doing prilosec as suggested and pain is improved but not completely gone. Denies weight loss, hematemesis, melena or any other alarm complaints.  2. DM/HTN/HL: some of her meds were held last time because of gastroenteritis. Wants to know if she can go back to her regular dose now.   Depression History:      The patient denies a depressed mood most of the day and a diminished interest in her usual daily activities.         Preventive Screening-Counseling & Management  Alcohol-Tobacco     Smoking Status: never  Current Medications (verified): 1)  Accupril 40 Mg Tabs (Quinapril Hcl) .... Take 1 Tablet By Mouth Daily. 2)  Lantus 100 Unit/ml Soln (Insulin Glargine) .... Inject 70  Units Subcutaneously Once A Day 3)  Neurontin 300 Mg Caps (Gabapentin) .... Take 1 Tablet in The Morning and 2 Tablets At  Bedtime 4)  Prilosec 40 Mg Cpdr (Omeprazole) .... Take 1 Capsule By Mouth Once A Day 5)  Glucophage 500 Mg  Tabs (Metformin Hcl) .... Take 1 Tablet By Mouth Twice  A Day 6)  Prandin 1 Mg  Tabs (Repaglinide) .... Take 1 Tablet By Mouth Three Times A Day 7)  Furosemide 40 Mg Tabs (Furosemide) .... Take One By Mouth Once Daily 8)  Nitro-Dur 0.2 Mg/hr Pt24 (Nitroglycerin) .... Apply 1/4 Patch To Heel in Morning and Leave On 24 Hours 9)  Novolog 100 Unit/ml Soln (Insulin Aspart) .Marland Kitchen.. 10 Units Subcutaneous Injection With Each Meal. 10)  Lipitor 20 Mg Tabs (Atorvastatin Calcium) .... Take 1 Tablet By Mouth Once A Day 11)  Tramadol Hcl 50 Mg Tabs (Tramadol Hcl) .... Take 1 Tablet Every 8 Hour For Pain As Needed.  Allergies (verified): 1)  ! Morphine 2)  ! Codeine Sulfate (Codeine Sulfate) 3)  ! Darvon 4)  ! * Hyfromorphone (See Dose Form) 5)  ! Demerol 6)  ! * Tylox/percocet  Past History:  Past Medical History: Last updated: 11/26/2008 Venous insufficiency Hypertension GERD Diabetes mellitus, type II Peripheral neuropathy Depression Hyperlipidemia  Past Surgical History: Last updated: 06/18/2007 Cholecystectomy 2001 Hysterectomy 2001 Oophorectomy 2001  Family History: Last updated: 11-26-2008 Parents had CHF died after age of 83s  Social History: Last updated: 07/11/2009 Divorced Never Smoked Alcohol use-no Drug use-no  recently had to retire from job 2/2 limitaitons of walking  quilting is a good relaxation  Risk Factors: Exercise: no (10/31/2009)  Risk Factors: Smoking Status: never (05/02/2010)  Review of Systems       as per hPI  Physical Exam  General:  alert, well-developed, and overweight-appearing.  alert, well-developed, and overweight-appearing.   Neck:  supple.  supple.   Lungs:  normal respiratory effort and normal breath sounds.  normal respiratory effort and normal breath sounds.   Heart:  normal rate and regular rhythm.  normal rate and  regular rhythm.   Abdomen:  soft and non-tender.  soft and non-tender.   Pulses:  normal peripheral pulses  Extremities:  no cyanosis, clubbing or edema  Neurologic:  non focal.  Psych:  normally interactive.     Impression & Recommendations:  Problem # 1:  DIABETES MELLITUS, TYPE II (ICD-250.00) A1c: 9.2 (04/24/2010 2:44:59 PM)  MICROALB/CR: 23.0 (03/23/2009 11:01:00 PM) BP:  /   LDL: 108 (08/29/2009 9:11:00 PM) EYE: No diabetic retinopathy.    (09/06/2009 3:30:26 PM) FOOT: yes (08/29/2009 9:43:14 AM) FOOT RISK:   WEIGHT: 38.58 (05/02/2010 1:52:51 PM)   Her updated medication list for this problem includes:    Accupril 40 Mg Tabs (Quinapril hcl) .Marland Kitchen... Take 1 tablet by mouth daily.    Lantus 100 Unit/ml Soln (Insulin glargine) ..... Inject 70  units subcutaneously once a day    Glucophage 500 Mg Tabs (Metformin hcl) .Marland Kitchen... Take 1 tablet by mouth twice  a day    Prandin 1 Mg Tabs (Repaglinide) .Marland Kitchen... Take 1 tablet by mouth three times a day    Novolog 100 Unit/ml Soln (Insulin aspart) .Marland KitchenMarland KitchenMarland KitchenMarland Kitchen 10 units subcutaneous injection with each meal.  Problem # 2:  HYPERTENSION (ICD-401.9) Will restart her meds.  Her updated medication list for this problem includes:    Accupril 40 Mg Tabs (Quinapril hcl) .Marland Kitchen... Take 1 tablet by mouth daily.    Furosemide 40 Mg Tabs (Furosemide) .Marland Kitchen... Take one by mouth once daily  Problem # 3:  HYPERLIPIDEMIA (ICD-272.4) No changes today.  Chol: 200 (08/29/2009 9:11:00 PM)HDL:  39 (08/29/2009 9:11:00 PM)LDL:  108 (08/29/2009 9:11:00 PM)Tri:   AST:  19 (04/24/2010 4:59:00 PM) ALT:  24 (04/24/2010 4:59:00 PM)T. Bili:  0.7 (04/24/2010 4:59:00 PM) AP:  133 (04/24/2010 4:59:00 PM)   Her updated medication list for this problem includes:    Lipitor 20 Mg Tabs (Atorvastatin calcium) .Marland Kitchen... Take 1 tablet by mouth once a day  Problem # 4:  RUQ PAIN (ICD-789.01) CT and labs reviewed. Given dyspepsia in an elderly, diverticulosis, as well as the need for routine  colonoscopy, will make a gi referral. Continue symptomatic management with ppi for now.   Orders: Gastroenterology Referral (GI)  Complete Medication List: 1)  Accupril 40 Mg Tabs (Quinapril hcl) .... Take 1 tablet by mouth daily. 2)  Lantus 100 Unit/ml Soln (Insulin glargine) .... Inject 70  units subcutaneously once a day 3)  Neurontin 300 Mg Caps (Gabapentin) .... Take 1 tablet in the morning and 2 tablets at bedtime 4)  Prilosec 40 Mg Cpdr (Omeprazole) .... Take 1 capsule by mouth once a day 5)  Glucophage 500 Mg Tabs (Metformin hcl) .... Take 1 tablet by mouth twice  a day 6)  Prandin 1 Mg Tabs (Repaglinide) .... Take 1 tablet by mouth three times a day 7)  Furosemide 40 Mg Tabs (Furosemide) .... Take one  by mouth once daily 8)  Nitro-dur 0.2 Mg/hr Pt24 (Nitroglycerin) .... Apply 1/4 patch to heel in morning and leave on 24 hours 9)  Novolog 100 Unit/ml Soln (Insulin aspart) .Marland Kitchen.. 10 units subcutaneous injection with each meal. 10)  Lipitor 20 Mg Tabs (Atorvastatin calcium) .... Take 1 tablet by mouth once a day 11)  Tramadol Hcl 50 Mg Tabs (Tramadol hcl) .... Take 1 tablet every 8 hour for pain as needed.  Patient Instructions: 1)  You can go back to taking your regular dose of medicines.  2)  Please schedule a follow-up appointment in 1 month. 3)  Please keep up your appointment with the gastroenterologist.   Prevention & Chronic Care Immunizations   Influenza vaccine: Fluvax Non-MCR  (08/29/2009)    Tetanus booster: Not documented   Td booster deferral: Deferred  (05/02/2010)    Pneumococcal vaccine: Not documented   Pneumococcal vaccine deferral: Deferred  (05/02/2010)    H. zoster vaccine: Not documented   H. zoster vaccine deferral: Deferred  (05/02/2010)  Colorectal Screening   Hemoccult: Not documented    Colonoscopy: Not documented   Colonoscopy action/deferral: GI referral  (08/29/2009)  Other Screening   Pap smear: Not documented   Pap smear action/deferral:  Not indicated S/P hysterectomy  (08/29/2009)    Mammogram: ASSESSMENT: Negative - BI-RADS 1^MM DIGITAL SCREENING  (06/05/2009)   Mammogram due: 06/05/2010    DXA bone density scan: Not documented   Smoking status: never  (05/02/2010)  Diabetes Mellitus   HgbA1C: 9.2  (04/24/2010)    Eye exam: No diabetic retinopathy.     (09/06/2009)   Eye exam due: 09/2010    Foot exam: yes  (08/29/2009)   Foot exam action/deferral: Do today   High risk foot: Yes  (04/29/2007)   Foot care education: Done  (04/29/2007)   Foot exam due: 07/30/2007    Urine microalbumin/creatinine ratio: 23.0  (03/23/2009)    Diabetes flowsheet reviewed?: Yes   Progress toward A1C goal: Unchanged  Lipids   Total Cholesterol: 200  (08/29/2009)   Lipid panel action/deferral: Lipid Panel ordered   LDL: 108  (08/29/2009)   LDL Direct: Not documented   HDL: 39  (08/29/2009)   Triglycerides: 264  (08/29/2009)    SGOT (AST): 19  (04/24/2010)   SGPT (ALT): 24  (04/24/2010)   Alkaline phosphatase: 133  (04/24/2010)   Total bilirubin: 0.7  (04/24/2010)    Lipid flowsheet reviewed?: Yes   Progress toward LDL goal: Unchanged  Hypertension   Last Blood Pressure: 136 / 82  (05/02/2010)   Serum creatinine: 0.70  (04/24/2010)   BMP action: Ordered   Serum potassium 4.0  (04/24/2010)    Hypertension flowsheet reviewed?: Yes   Progress toward BP goal: Deteriorated  Self-Management Support :   Personal Goals (by the next clinic visit) :     Personal A1C goal: 7  (08/29/2009)     Personal blood pressure goal: 130/80  (08/29/2009)     Personal LDL goal: 100  (08/29/2009)    Patient will work on the following items until the next clinic visit to reach self-care goals:     Medications and monitoring: bring all of my medications to every visit, examine my feet every day  (05/02/2010)     Eating: drink diet soda or water instead of juice or soda, eat more vegetables, use fresh or frozen vegetables, eat foods that  are low in salt, eat baked foods instead of fried foods, eat fruit for snacks and desserts,  limit or avoid alcohol  (05/02/2010)     Activity: take a 30 minute walk every day  (05/02/2010)    Diabetes self-management support: Written self-care plan, Education handout, Pre-printed educational material  (05/02/2010)   Diabetes care plan printed   Diabetes education handout printed   Last diabetes self-management training by diabetes educator: 04/11/2009    Hypertension self-management support: Written self-care plan, Education handout, Pre-printed educational material  (05/02/2010)   Hypertension self-care plan printed.   Hypertension education handout printed    Lipid self-management support: Written self-care plan, Education handout, Pre-printed educational material  (05/02/2010)   Lipid self-care plan printed.   Lipid education handout printed    Vital Signs:  Patient profile:   65 year old female Height:      65 inches (165.10 cm) Weight:      231 pounds (105.00 kg) BMI:     38.58 Temp:     97.9 degrees F (36.61 degrees C) oral Pulse rate:   77 / minute BP sitting:   136 / 82  (right arm)  Vitals Entered By: Angelina Ok RN (May 02, 2010 1:54 PM)

## 2011-01-08 NOTE — Progress Notes (Signed)
Summary: refill/gg     [name]  Phone Note Refill Request  on October 22, 2007 10:52 AM   Medication #2:  LASIX 80 MG TABS Take 1 tablet by once daily Pt can't get appointment with you until December, can she have refill??  Initial call taken by: Merrie Roof RN,  October 22, 2007 11:01 AM      Prescriptions: LASIX 80 MG TABS (FUROSEMIDE) Take 1 tablet by once daily  #31 x 1   Entered and Authorized by:   Ronda Fairly MD   Signed by:   Ronda Fairly MD on 10/22/2007   Method used:   Electronically sent to ...       [facility] [address]       [address]       [place], [place]  [address]       Ph: [phone]       Fax: [phone]   RxID:   [id number]

## 2011-01-08 NOTE — Progress Notes (Signed)
Summary: Patient assistance  Phone Note Refill Request           Prescriptions: PRANDIN 1 MG  TABS (REPAGLINIDE) Take 1 tablet by mouth three times a day  #90 x .   Entered and Authorized by:   Ronda Fairly MD   Signed by:   Ronda Fairly MD on 04/27/2008   Method used:   Samples Given   RxID:   [id number]   Patient Assist Medication Verification: Medication:Prandin 1mg  Lot# [id number] A Exp [id number] Tech approval:NLS

## 2011-01-08 NOTE — Assessment & Plan Note (Signed)
Summary: RA/NEEDS 1 MONTH F/U VISIT/CH   Vital Signs:  Patient profile:   65 year old female Height:      65 inches (165.10 cm) Weight:      231 pounds (105.00 kg) BMI:     38.58 Temp:     97.2 degrees F (36.22 degrees C) oral Pulse rate:   95 / minute BP sitting:   122 / 73  (left arm)  Vitals Entered By: Starleen Arms CMA (June 12, 2010 2:28 PM) CC: need rf on all medications, need dm meds [facility], wants lisinipril instead of accupril Pain Assessment Patient in pain? no      Nutritional Status BMI of > 30 = obese Nutritional Status Detail nl CBG Result 127  Does patient need assistance? Functional Status Self care Ambulation Normal   Diabetic Foot Exam Last Podiatry Exam Date: 06/12/2010  Foot Inspection Is there a history of a foot ulcer?              No Is there a foot ulcer now?              No Is there swelling or an abnormal foot shape?          No Are the toenails long?                No Are the toenails thick?                No Are the toenails ingrown?              No Is there heavy callous build-up?              No Is there pain in the calf muscle (Intermittent claudication) when walking?    NoIs there a claw toe deformity?              No Is there elevated skin temperature?            No Is there limited ankle dorsiflexion?            No Is there foot or ankle muscle weakness?            No  Diabetic Foot Care Education Patient educated on appropriate care of diabetic feet.  Pulse Check          Right Foot          Left Foot Posterior Tibial:        normal            normal Dorsalis Pedis:        normal            normal  High Risk Feet? Yes   10-g (5.07) Semmes-Weinstein Monofilament Test           Right Foot          Left Foot Visual Inspection               Test Control      normal         normal Site 1         abnormal         abnormal Site 2         normal         normal Site 3         normal         normal Site 4          abnormal  normal Site 5         abnormal         abnormal Site 6         abnormal         normal Site 7         normal         normal Site 8         normal         normal Site 9         normal         normal Site 10         normal         normal  Impression      normal         normal  Legend:  Site 1 = Plantar aspect of first toe (center of pad) Site 2 = Plantar aspect of third toe (center of pad) Site 3 = Plantar aspect of fifth toe (center of pad) Site 4 = Plantar aspect of first metatarsal head Site 5 = Plantar aspect of third metatarsal head Site 6 = Plantar aspect of fifth metatarsal head Site 7 = Plantar aspect of medial midfoot Site 8 = Plantar aspect of lateral midfoot Site 9 = Plantar aspect of heel Site 10 = dorsal aspect of foot between the base of the first and second toes   Result is Abnormal if patient was unable to perceive the monofilament at site indicated.    Primary Care Provider:  Jackson Latino MD  CC:  need rf on all medications, need dm meds [facility], and wants lisinipril instead of accupril.  History of Present Illness: 1. F/U appointment on: DM --patient has financial difficulties with affording her medications and food. 2. Chronic RUQ pain 3/10, intermittent. Had a CT scan which revealed only diverticulosis. PMHx of incisional hernia and multiple abdominal and pelvic surgeries. By history might be realted to adhesions. Denies any wegith loss, fever, chills, melena or rash.   Preventive Screening-Counseling & Management  Alcohol-Tobacco     Alcohol drinks/day: 0     Smoking Status: never  Problems Prior to Update: 1)  Diabetes Mellitus, Type II  (ICD-250.00) 2)  Diabetic Peripheral Neuropathy  (ICD-250.60) 3)  Hypertension  (ICD-401.9) 4)  Hyperlipidemia  (ICD-272.4) 5)  Depression  (ICD-311) 6)  Gerd  (ICD-530.81) 7)  Preventive Health Care  (ICD-V70.0) 8)  Aftercare, Long-term Use, Medications Nec  (ICD-V58.69) 9)  Hx of  Ventral Hernia, Incisional  (ICD-553.21) 10)  Hysterectomy, Hx of  (ICD-V45.77) 11)  Cholecystectomy, Hx of  (ICD-V45.79) 12)  Venous Insufficiency  (ICD-459.81) 13)  Ruq Pain  (ICD-789.01) 14)  Shoulder Pain, Right  (ICD-719.41) 15)  Achilles Tendinitis  (ICD-726.71) 16)  Calcaneal Spur  (ICD-726.73) 17)  Dizziness  (ICD-780.4) 18)  Edema Leg  (ICD-782.3)  Current Problems (verified): 1)  Special Screening For Malignant Neoplasms Colon  (ICD-V76.51) 2)  Diabetes Mellitus, Type II  (ICD-250.00) 3)  Diabetic Peripheral Neuropathy  (ICD-250.60) 4)  Hypertension  (ICD-401.9) 5)  Hyperlipidemia  (ICD-272.4) 6)  Depression  (ICD-311) 7)  Gerd  (ICD-530.81) 8)  Preventive Health Care  (ICD-V70.0) 9)  Aftercare, Long-term Use, Medications Nec  (ICD-V58.69) 10)  Hx of Ventral Hernia, Incisional  (ICD-553.21) 11)  Hysterectomy, Hx of  (ICD-V45.77) 12)  Cholecystectomy, Hx of  (ICD-V45.79) 13)  Venous Insufficiency  (ICD-459.81) 14)  Ruq Pain  (ICD-789.01) 15)  Shoulder Pain, Right  (ICD-719.41) 16)  Achilles Tendinitis  (  ICD-726.71) 17)  Calcaneal Spur  (ICD-726.73) 18)  Dizziness  (ICD-780.4) 19)  Edema Leg  (ICD-782.3)  Medications Prior to Update: 1)  Accupril 40 Mg Tabs (Quinapril Hcl) .... Take 1 Tablet By Mouth Daily. 2)  Lantus 100 Unit/ml Soln (Insulin Glargine) .... Inject 70  Units Subcutaneously Once A Day 3)  Neurontin 300 Mg Caps (Gabapentin) .... Take 1 Tablet in The Morning and 2 Tablets At Bedtime 4)  Prilosec 40 Mg Cpdr (Omeprazole) .... Take 1 Capsule By Mouth Once A Day 5)  Glucophage 500 Mg  Tabs (Metformin Hcl) .... Take 1 Tablet By Mouth Twice  A Day 6)  Prandin 1 Mg  Tabs (Repaglinide) .... Take 1 Tablet By Mouth Three Times A Day 7)  Furosemide 40 Mg Tabs (Furosemide) .... Take One By Mouth Once Daily 8)  Nitro-Dur 0.2 Mg/hr Pt24 (Nitroglycerin) .... Apply 1/4 Patch To Heel in Morning and Leave On 24 Hours 9)  Novolog 100 Unit/ml Soln (Insulin Aspart) .Marland Kitchen.. 10  Units Subcutaneous Injection With Each Meal. 10)  Lipitor 20 Mg Tabs (Atorvastatin Calcium) .... Take 1 Tablet By Mouth Once A Day 11)  Tramadol Hcl 50 Mg Tabs (Tramadol Hcl) .... Take 1 Tablet Every 8 Hour For Pain As Needed.  Current Medications (verified): 1)  Lantus 100 Unit/ml Soln (Insulin Glargine) .... Inject 70  Units Subcutaneously Once A Day 2)  Neurontin 300 Mg Caps (Gabapentin) .... Take 1 Tablet in The Morning and 2 Tablets At Bedtime 3)  Prilosec 40 Mg Cpdr (Omeprazole) .... Take 1 Capsule By Mouth Once A Day 4)  Glucophage 500 Mg  Tabs (Metformin Hcl) .... Take 1 Tablet By Mouth Twice  A Day 5)  Prandin 1 Mg  Tabs (Repaglinide) .... Take 1 Tablet By Mouth Three Times A Day 6)  Furosemide 40 Mg Tabs (Furosemide) .... Take One By Mouth Once Daily 7)  Novolog 100 Unit/ml Soln (Insulin Aspart) .Marland Kitchen.. 10 Units Subcutaneous Injection With Each Meal. 8)  Lipitor 20 Mg Tabs (Atorvastatin Calcium) .... Take 1 Tablet By Mouth Once A Day 9)  Lisinopril 40 Mg Tabs (Lisinopril) .... Take 1 Tablet By Mouth Once A Day  Allergies (verified): 1)  ! Morphine 2)  ! Codeine Sulfate (Codeine Sulfate) 3)  ! Darvon 4)  ! * Hyfromorphone (See Dose Form) 5)  ! Demerol 6)  ! * Tylox/percocet  Past History:  Family History: Last updated: 2008/12/18 Parents had CHF died after age of 49s  Social History: Last updated: 07/11/2009 Divorced Never Smoked Alcohol use-no Drug use-no  recently had to retire from job 2/2 limitaitons of walking  quilting is a good relaxation  Risk Factors: Alcohol Use: 0 (06/12/2010) Exercise: no (10/31/2009)  Risk Factors: Smoking Status: never (06/12/2010)  Past medical, surgical, family and social histories (including risk factors) reviewed for relevance to current acute and chronic problems.  Past Medical History: Reviewed history from 12/18/08 and no changes required. Venous insufficiency Hypertension GERD Diabetes mellitus, type II Peripheral  neuropathy Depression Hyperlipidemia  Past Surgical History: Reviewed history from 06/18/2007 and no changes required. Cholecystectomy 2001 Hysterectomy 2001 Oophorectomy 2001  Family History: Reviewed history from December 18, 2008 and no changes required. Parents had CHF died after age of 17s  Social History: Reviewed history from 07/11/2009 and no changes required. Divorced Never Smoked Alcohol use-no Drug use-no  recently had to retire from job 2/2 limitaitons of walking  quilting is a good relaxation  Review of Systems       per HPI  Physical Exam  General:  alert, well-developed, and overweight-appearing.     Head:  normocephalic and atraumatic.   Eyes:  vision grossly intact, pupils equal, pupils round, and pupils reactive to light.   Ears:  R ear normal and L ear normal.   Nose:  no external deformity.   Mouth:  Oral mucosa and oropharynx without lesions or exudates.  Teeth in good repair. Neck:  supple.   no masses.   Lungs:  Normal respiratory effort, chest expands symmetrically. Lungs are clear to auscultation, no crackles or wheezes. Heart:  normal rate and regular rhythm.  normal rate and regular rhythm.   Abdomen:  soft and non-tender.  normal bowel sounds, no distention, no guarding, no rigidity, and no hepatomegaly.   Rectal:  normal sphincter tone.   Msk:  R shoulder: No gross deformity. ROM limited to 140 flexion actively but close to 150-160 passively, 100 abduction actively.  Full ER bilaterally. strength mildly decreased empty can but 5/5 with resisted IR. no drop arm + hawkins and neers Pulses:  normal peripheral pulses  Extremities:  no cyanosis, clubbing or edema  Neurologic:  non focal. cranial nerves II-XII intact, strength normal in all extremities, gait normal, and DTRs symmetrical and normal.   Skin:  turgor normal, color normal, and no rashes.   Psych:  Oriented X3, memory intact for recent and remote, normally interactive, good eye contact,  not anxious appearing, and depressed affect.    Diabetes Management Exam:    Foot Exam (with socks and/or shoes not present):       Sensory-Monofilament:          Left foot: normal          Right foot: normal   Impression & Recommendations:  Problem # 1:  DIABETES MELLITUS, TYPE II (ICD-250.00) Uncontrolled due to the patient's inability to afford medications and financial strain in general. REferred to speak with Ms. Chauncey Reading and Ms. Hulen Shouts. DM, etiology, treatment, health risks, diet, foot care discussed. The following medications were removed from the medication list:    Accupril 40 Mg Tabs (Quinapril hcl) .Marland Kitchen... Take 1 tablet by mouth daily. Her updated medication list for this problem includes:    Lantus 100 Unit/ml Soln (Insulin glargine) ..... Inject 70  units subcutaneously once a day    Glucophage 500 Mg Tabs (Metformin hcl) .Marland Kitchen... Take 1 tablet by mouth twice  a day    Prandin 1 Mg Tabs (Repaglinide) .Marland Kitchen... Take 1 tablet by mouth three times a day    Novolog 100 Unit/ml Soln (Insulin aspart) .Marland KitchenMarland KitchenMarland KitchenMarland Kitchen 10 units subcutaneous injection with each meal.    Lisinopril 40 Mg Tabs (Lisinopril) .Marland Kitchen... Take 1 tablet by mouth once a day  Orders: T-Capillary Blood Glucose ([address]) DME Referral (DME) Ophthalmology Referral (Ophthalmology)  Labs Reviewed: Creat: 0.70 (04/24/2010)     Last Eye Exam: No diabetic retinopathy.    (09/06/2009) Reviewed HgBA1c results: 9.2 (04/24/2010)  7.4 (08/29/2009)  Problem # 2:  HYPERTENSION (ICD-401.9) Contorlled. The following medications were removed from the medication list:    Accupril 40 Mg Tabs (Quinapril hcl) .Marland Kitchen... Take 1 tablet by mouth daily. Her updated medication list for this problem includes:    Furosemide 40 Mg Tabs (Furosemide) .Marland Kitchen... Take one by mouth once daily    Lisinopril 40 Mg Tabs (Lisinopril) .Marland Kitchen... Take 1 tablet by mouth once a day  BP today: 122/73 Prior BP: 136/82 (05/02/2010)  Labs Reviewed: K+: 4.0  (04/24/2010) Creat: : 0.70 (04/24/2010)  Chol: 200 (08/29/2009)   HDL: 39 (08/29/2009)   LDL: 108 (08/29/2009)   TG: 264 (08/29/2009)  Orders: Ophthalmology Referral (Ophthalmology)  Problem # 3:  DEPRESSION (ICD-311) Denies SI/HI or mania. Instructed to call with any questions. Side-effects of Fluoxetine throroughly discussed. Her updated medication list for this problem includes:    Fluoxetine Hcl 10 Mg Caps (Fluoxetine hcl) .Marland Kitchen... Take 1 tablet by mouth once a day  Discussed treatment options, including trial of antidpressant medication. Will refer to behavioral health. Follow-up call in in 24-48 hours and recheck in 2 weeks, sooner as needed. Patient agrees to call if any worsening of symptoms or thoughts of doing harm arise. Verified that the patient has no suicidal ideation at this time.   Complete Medication List: 1)  Lantus 100 Unit/ml Soln (Insulin glargine) .... Inject 70  units subcutaneously once a day 2)  Neurontin 300 Mg Caps (Gabapentin) .... Take 1 tablet in the morning and 2 tablets at bedtime 3)  Prilosec 40 Mg Cpdr (Omeprazole) .... Take 1 capsule by mouth once a day 4)  Glucophage 500 Mg Tabs (Metformin hcl) .... Take 1 tablet by mouth twice  a day 5)  Prandin 1 Mg Tabs (Repaglinide) .... Take 1 tablet by mouth three times a day 6)  Furosemide 40 Mg Tabs (Furosemide) .... Take one by mouth once daily 7)  Novolog 100 Unit/ml Soln (Insulin aspart) .Marland Kitchen.. 10 units subcutaneous injection with each meal. 8)  Lipitor 20 Mg Tabs (Atorvastatin calcium) .... Take 1 tablet by mouth once a day 9)  Lisinopril 40 Mg Tabs (Lisinopril) .... Take 1 tablet by mouth once a day 10)  Fluoxetine Hcl 10 Mg Caps (Fluoxetine hcl) .... Take 1 tablet by mouth once a day  Other Orders: Mammogram (Screening) (Mammo) Dexa scan (Dexa scan) T-Urine Microalbumin w/creat. ratio [phone])  Patient Instructions: 1)  [place], make an appointment with Ms. Rhiley for diabetic teaching. 2)   Please, make a follow up appointment in 8 weeks. 3)  Call with any questions. Prescriptions: FLUOXETINE HCL 10 MG CAPS (FLUOXETINE HCL) Take 1 tablet by mouth once a day  #30 x 11   Entered and Authorized by:   Deatra Robinson MD   Signed by:   Deatra Robinson MD on 06/12/2010   Method used:   Electronically to        [facility] [address] (retail)       [address]       [place], [place]  [address]       Ph: [phone]       Fax: [phone]   RxID:   [id number] LIPITOR 20 MG TABS (ATORVASTATIN CALCIUM) Take 1 tablet by mouth once a day  #31 x 12   Entered and Authorized by:   Deatra Robinson MD   Signed by:   Deatra Robinson MD on 06/12/2010   Method used:   Electronically to        [facility] [place] (retail)       [address]       [place], [place]  [address]       Ph: [phone]       Fax: [phone]   RxID:   [id number] NOVOLOG 100 UNIT/ML SOLN (INSULIN ASPART) 10 units subcutaneous injection with each meal.  #1 x 11   Entered and Authorized by:   Deatra Robinson  MD   Signed by:   Deatra Robinson MD on 06/12/2010   Method used:   Electronically to        [facility] (retail)       [address]       [place], [place]  [address]       Ph: [phone]       Fax: [phone]   RxID:   [id number] PRANDIN 1 MG  TABS (REPAGLINIDE) Take 1 tablet by mouth three times a day  #90 x 11   Entered and Authorized by:   Deatra Robinson MD   Signed by:   Deatra Robinson MD on 06/12/2010   Method used:   Electronically to        [facility] (retail)       [address]       [place], [place]  [address]       Ph: [phone]       Fax: [phone]   RxID:   [id number] GLUCOPHAGE 500 MG  TABS (METFORMIN HCL) Take 1 tablet by mouth twice  a day  #120 x 11   Entered and  Authorized by:   Deatra Robinson MD   Signed by:   Deatra Robinson MD on 06/12/2010   Method used:   Electronically to        [facility] (retail)       [address]       [place], [place]  [address]       Ph: [phone]       Fax: [phone]   RxID:   [id number] LISINOPRIL 40 MG TABS (LISINOPRIL) Take 1 tablet by mouth once a day  #30 x 11   Entered and Authorized by:   Deatra Robinson MD   Signed by:   Deatra Robinson MD on 06/12/2010   Method used:   Electronically to        [facility] [place] (retail)       [address]       [place], [place]  [address]       Ph: [phone]       Fax: [phone]   RxID:   [id number]   Prevention & Chronic Care Immunizations   Influenza vaccine: Fluvax Non-MCR  (08/29/2009)   Influenza vaccine deferral: Deferred  (06/12/2010)   Influenza vaccine due: 08/09/2010    Tetanus booster: Not documented   Td booster deferral: Refused  (06/12/2010)   Tetanus booster due: 06/12/2020    Pneumococcal vaccine: Not documented   Pneumococcal vaccine deferral: Deferred  (05/02/2010)    H. zoster vaccine: Not documented   H. zoster vaccine deferral: Not available  (06/12/2010)  Colorectal Screening   Hemoccult: Not documented   Hemoccult action/deferral: Refused  (06/12/2010)    Colonoscopy: Not documented   Colonoscopy action/deferral: GI referral  (06/12/2010)   Colonoscopy due: 08/07/2010  Other Screening   Pap smear: Not documented   Pap smear action/deferral: Not indicated S/P hysterectomy  (08/29/2009)    Mammogram: ASSESSMENT: Negative - BI-RADS 1^MM DIGITAL SCREENING  (06/05/2009)   Mammogram action/deferral: Ordered  (06/12/2010)   Mammogram due: 06/05/2010    DXA bone density scan: Not documented   DXA bone density action/deferral:  Ordered  (06/12/2010)   Smoking status: never  (06/12/2010)  Diabetes Mellitus   HgbA1C:  9.2  (04/24/2010)    Eye exam: No diabetic retinopathy.     (09/06/2009)   Diabetic eye exam action/deferral: Ophthalmology referral  (06/12/2010)   Eye exam due: 09/2010    Foot exam: yes  (06/12/2010)   Foot exam action/deferral: Do today   High risk foot: Yes  (06/12/2010)   Foot care education: Done  (06/12/2010)   Foot exam due: 07/30/2007    Urine microalbumin/creatinine ratio: 23.0  (03/23/2009)   Urine microalbumin action/deferral: Ordered   Urine microalbumin/cr due: 06/13/2011    Diabetes flowsheet reviewed?: Yes   Progress toward A1C goal: Improved    Stage of readiness to change (diabetes management): Action  Lipids   Total Cholesterol: 200  (08/29/2009)   Lipid panel action/deferral: Lipid Panel ordered   LDL: 108  (08/29/2009)   LDL Direct: Not documented   HDL: 39  (08/29/2009)   Triglycerides: 264  (08/29/2009)   Lipid panel due: 09/12/2010    SGOT (AST): 19  (04/24/2010)   SGPT (ALT): 24  (04/24/2010)   Alkaline phosphatase: 133  (04/24/2010)   Total bilirubin: 0.7  (04/24/2010)   Liver panel due: 06/13/2011    Lipid flowsheet reviewed?: Yes   Progress toward LDL goal: Unchanged    Stage of readiness to change (lipid management): Maintenance  Hypertension   Last Blood Pressure: 122 / 73  (06/12/2010)   Serum creatinine: 0.70  (04/24/2010)   BMP action: Ordered   Serum potassium 4.0  (04/24/2010)   Basic metabolic panel due: 12/13/2010    Hypertension flowsheet reviewed?: Yes   Progress toward BP goal: At goal    Stage of readiness to change (hypertension management): Maintenance  Self-Management Support :   Personal Goals (by the next clinic visit) :     Personal A1C goal: 7  (08/29/2009)     Personal blood pressure goal: 130/80  (08/29/2009)     Personal LDL goal: 100  (08/29/2009)    Patient will work on the following items until the next clinic visit to reach self-care goals:     Medications and monitoring: take my medicines every day   (06/12/2010)     Eating: use fresh or frozen vegetables, eat foods that are low in salt, eat baked foods instead of fried foods  (06/12/2010)     Activity: take a 30 minute walk every day  (05/02/2010)    Diabetes self-management support: Pre-printed educational material, Resources for patients handout, Written self-care plan, Referred for DM self-management training  (06/12/2010)   Diabetes care plan printed   Last diabetes self-management training by diabetes educator: 04/11/2009    Hypertension self-management support: Pre-printed educational material, Resources for patients handout, Written self-care plan  (06/12/2010)   Hypertension self-care plan printed.    Lipid self-management support: Pre-printed educational material, Resources for patients handout, Written self-care plan  (06/12/2010)   Lipid self-care plan printed.      Resource handout printed.   Nursing Instructions: CBG today (see order) GI referral for screening colonoscopy (see order) Schedule screening mammogram (see order) Schedule screening DXA bone density scan (see order) Diabetic foot exam today Refer for diabetes self-management training (see order) Refer for screening diabetic eye exam (see order)    Laboratory Results   Blood Tests   Date/Time Received: 2:46 PM  Date/Time Reported: Starleen Arms CMA  June 12, 2010 2:46 PM   CBG Random:: 127  Diabetes Self Management Training Referral Patient Name: [name] Date Of Birth: 1946/10/03 MRN: [id number] Current Diagnosis:  SPECIAL SCREENING FOR MALIGNANT NEOPLASMS COLON (ICD-V76.51) DIABETES MELLITUS, TYPE II (ICD-250.00)     DIABETIC PERIPHERAL NEUROPATHY (ICD-250.60) HYPERTENSION (ICD-401.9) HYPERLIPIDEMIA (ICD-272.4) DEPRESSION (ICD-311) GERD (ICD-530.81) PREVENTIVE HEALTH CARE (ICD-V70.0) AFTERCARE, LONG-TERM USE, MEDICATIONS NEC (ICD-V58.69) Hx of VENTRAL HERNIA, INCISIONAL (ICD-553.21) HYSTERECTOMY, HX OF  (ICD-V45.77) CHOLECYSTECTOMY, HX OF (ICD-V45.79) VENOUS INSUFFICIENCY (ICD-459.81) RUQ PAIN (ICD-789.01) SHOULDER PAIN, RIGHT (ICD-719.41) ACHILLES TENDINITIS (ICD-726.71) CALCANEAL SPUR (ICD-726.73) DIZZINESS (ICD-780.4) EDEMA LEG (ICD-782.3)     Management Training Needs:   Follow-up DSMT(2 hours/year)  Complicating Conditions:  HTN  Neuropathy  Barriers:  Other  low fianancial resources

## 2011-01-08 NOTE — Progress Notes (Signed)
Summary: Medication change  Phone Note Refill Request Message from:  Fax from Pharmacy  Refills Requested: Medication #1:  LISINOPRIL 40 MG TABS Take 1 tablet by mouth once a day Can this be changed to Accupril 40 mg daily   Method Requested: Fax to Local Pharmacy Initial call taken by: Angelina Ok RN,  December 06, 2009 11:20 AM    Prescriptions: LISINOPRIL 40 MG TABS (LISINOPRIL) Take 1 tablet by mouth once a day  #31 x 12   Entered and Authorized by:   Zoila Shutter MD   Signed by:   Zoila Shutter MD on 12/06/2009   Method used:   Electronically to        [facility] [address] (retail)       [phone]       [place], [place]  [address]       Ph: [phone]       Fax: [phone]   RxID:   [id number]

## 2011-01-08 NOTE — Assessment & Plan Note (Signed)
Summary: (ACUTE-YANG)WANT TO CHANGE MEDS/CH   Vital Signs:  Patient profile:   65 year old female Height:      66 inches (167.64 cm) Weight:      225.8 pounds (102.64 kg) BMI:     36.58 Temp:     97.1 degrees F (36.17 degrees C) oral Pulse rate:   78 / minute BP sitting:   122 / 72  (right arm)  Vitals Entered By: Stanton Kidney Ditzler RN (March 23, 2009 8:57 AM) CC: Depression Is Patient Diabetic? Yes  Pain Assessment Patient in pain? yes     Location: heel spurs Intensity: 7 Onset of pain  long time Nutritional Status BMI of > 30 = obese Nutritional Status Detail appetite good  Have you ever been in a relationship where you felt threatened, hurt or afraid?denies   Does patient need assistance? Functional Status Self care Ambulation Normal Comments Discuss diabetes med.   Primary Care Provider:  Jackson Latino MD  CC:  Depression.  History of Present Illness: 65 year old with Past Medical History: Venous insufficiency,Hypertension, GERD,Diabetes mellitus, type II,Peripheral neuropathy,Depression ,Hyperlipidemia  comes today for diabetes follow up.    She a long time ago she had  diarrhea 2 to metformin and Metformin was decrease to 500mg  daily. She is not having diarrhea now. She run out of prandin. She is on 90 of lantus daily.  Her blood sugar in the morning has been 310 range, last time she check it was 1 month ago.   She is feeling better , no more dizziness, current dose of lisinopri and lasix l is good.   She had a fracture of her foot years ago , she then developed a heel spur. She is having heel pain for years now.  She takes motrin, or advil on anf off. Her pain is getting worse recently.    Depression History:      The patient denies a depressed mood most of the day and a diminished interest in her usual daily activities.         Preventive Screening-Counseling & Management     Smoking Status: never     Does Patient Exercise: no  Current Medications  (verified): 1)  Lisinopril 40 Mg Tabs (Lisinopril) .... Take 1 Tablet By Mouth Once A Day 2)  Lantus 100 Unit/ml Soln (Insulin Glargine) .... Inject 90  Units Subcutaneously Once A Day 3)  Neurontin 300 Mg Caps (Gabapentin) .... Take 1 Tablet in The Morning and 2 Tablets At Bedtime 4)  Prilosec 40 Mg Cpdr (Omeprazole) .... Take 1 Capsule By Mouth Once A Day 5)  Glucophage 500 Mg  Tabs (Metformin Hcl) .... Take 1 Tablet By Mouth Twice  A Day 6)  Prandin 1 Mg  Tabs (Repaglinide) .... Take 1 Tablet By Mouth Three Times A Day 7)  Pravachol 20 Mg  Tabs (Pravastatin Sodium) .... Take 2 Tablets By Mouth Once A Day 8)  Furosemide 40 Mg Tabs (Furosemide) .... Take One Tablet By Mouth Once Daily  Allergies: 1)  ! Morphine 2)  ! Codeine Sulfate (Codeine Sulfate) 3)  ! Darvon 4)  ! * Hyfromorphone (See Dose Form) 5)  ! Demerol 6)  ! * Tylox/percocet  Review of Systems  The patient denies fever, chest pain, syncope, dyspnea on exertion, peripheral edema, prolonged cough, headaches, hemoptysis, abdominal pain, melena, and hematochezia.    Physical Exam  General:  alert, well-nourished, and overweight-appearing.   Head:  normocephalic, atraumatic, and no abnormalities observed.  Lungs:  normal respiratory effort, no intercostal retractions, no accessory muscle use, and normal breath sounds.   Heart:  normal rate, regular rhythm, no murmur, and no gallop.   Abdomen:  soft, non-tender, normal bowel sounds, and no distention.   Extremities:  no edema.   Impression & Recommendations:  Problem # 1:  DIABETES MELLITUS, TYPE II (ICD-250.00) Her Diabetes is uncontrolled, HbA1c today at 10.5 prior at 9. She was not taking prandin for a while. She has not been checking her blood sugar in the morning for the last month. She said that she is using her insulin. I will continue with same dose of lantus. She will bring her meter next vist and will proceed with changes depending on her values. I will give her  refill for prandin. I will tray to titrate up metformin. I advised to go to her prior dose if she develop severe diarrhea. Her updated medication list for this problem includes:    Lisinopril 40 Mg Tabs (Lisinopril) .Marland Kitchen... Take 1 tablet by mouth once a day    Lantus 100 Unit/ml Soln (Insulin glargine) ..... Inject 90  units subcutaneously once a day    Glucophage 500 Mg Tabs (Metformin hcl) .Marland Kitchen... Take 1 tablet by mouth twice  a day    Prandin 1 Mg Tabs (Repaglinide) .Marland Kitchen... Take 1 tablet by mouth three times a day  Orders: T- Capillary Blood Glucose (04540) T-Hgb A1C (in-house) ([id number]) T-Urine Microalbumin w/creat. ratio (78295 / [id number])  Problem # 2:  HYPERTENSION (ICD-401.9) Her blood pressure is at goal, no more hypotension or dizziness. Her updated medication list for this problem includes:    Lisinopril 40 Mg Tabs (Lisinopril) .Marland Kitchen... Take 1 tablet by mouth once a day    Furosemide 40 Mg Tabs (Furosemide) .Marland Kitchen... Take one tablet by mouth once daily  Orders: T-Comprehensive Metabolic Panel ([id number])  BP today: 122/72 Prior BP: 96/57 (11/18/2008)  Labs Reviewed: K+: 4.6 (11/23/2008) Creat: : 0.77 (11/23/2008)   Chol: 230 (11/23/2008)   HDL: 38 (11/23/2008)   LDL: 153 (11/23/2008)   TG: 197 (11/23/2008)  Problem # 3:  HYPERLIPIDEMIA (ICD-272.4) I will check lipid today and increase dose as needed.  Her updated medication list for this problem includes:    Pravachol 20 Mg Tabs (Pravastatin sodium) .Marland Kitchen... Take 2 tablets by mouth once a day  Orders: T-Lipid Profile [id number])  Labs Reviewed: SGOT: 23 (09/18/2007)   SGPT: 29 (09/18/2007)   HDL:38 (11/23/2008), 39 (03/14/2008)  LDL:153 (11/23/2008), 179 (03/14/2008)  Chol:230 (11/23/2008), 269 (03/14/2008)  Trig:197 (11/23/2008), 254 (03/14/2008)  Problem # 4:  HEEL SPUR (ICD-726.73) She is having chronic heel pain, she has try motrin without significant releaved. I will refer her to [facility] for steroid injection.  Orders: Sports Medicine (Sports Med)  Problem # 5:  DIZZINESS (ICD-780.4) This has resolved after decreasing Blood pressure medications.  Complete Medication List: 1)  Lisinopril 40 Mg Tabs (Lisinopril) .... Take 1 tablet by mouth once a day 2)  Lantus 100 Unit/ml Soln (Insulin glargine) .... Inject 90  units subcutaneously once a day 3)  Neurontin 300 Mg Caps (Gabapentin) .... Take 1 tablet in the morning and 2 tablets at bedtime 4)  Prilosec 40 Mg Cpdr (Omeprazole) .... Take 1 capsule by mouth once a day 5)  Glucophage 500 Mg Tabs (Metformin hcl) .... Take 1 tablet by mouth twice  a day 6)  Prandin 1 Mg Tabs (Repaglinide) .... Take 1 tablet by mouth three times a day 7)  Pravachol  20 Mg Tabs (Pravastatin sodium) .... Take 2 tablets by mouth once a day 8)  Furosemide 40 Mg Tabs (Furosemide) .... Take one tablet by mouth once daily  Patient Instructions: 1)  Please schedule a follow-up appointment in 2 weeks. 2)  You will be called with any abnormalities in your bloodwork from today.  If you don't hear from [place] within a week, you can assume that your bloodwork was normal. 3)  Take calcium +Vitamin D daily. 4)  Check your blood sugar three times per day, bring your meter next visit. Prescriptions: FUROSEMIDE 40 MG TABS (FUROSEMIDE) take one tablet by mouth once daily  #30 x 3   Entered and Authorized by:   Hartley Barefoot MD   Signed by:   Hartley Barefoot MD on 03/23/2009   Method used:   Electronically to        [facility] [address] (retail)       [phone]       [place], [place]  [address]       Ph: [phone]       Fax: [phone]   RxID:   [id number] PRAVACHOL 20 MG  TABS (PRAVASTATIN SODIUM) Take 2 tablets by mouth once a day  #60 x 5   Entered and Authorized by:   Hartley Barefoot MD   Signed by:   Hartley Barefoot MD on 03/23/2009   Method used:   Electronically to        [facility] [address] (retail)       [phone]       [place], [place]  [address]       Ph: [phone]       Fax: [phone]   RxID:   [id number] PRANDIN 1 MG  TABS (REPAGLINIDE) Take 1 tablet by mouth three times a day  #90 x 4   Entered and Authorized by:   Hartley Barefoot MD   Signed by:   Hartley Barefoot MD on 03/23/2009   Method used:   Electronically to        [facility] [address] (retail)       [phone]       [place], [place]  [address]       Ph: [phone]       Fax: [phone]   RxID:   [id number] GLUCOPHAGE 500 MG  TABS (METFORMIN HCL) Take 1 tablet by mouth twice  a day  #120 x 4   Entered and Authorized by:   Hartley Barefoot MD   Signed by:   Hartley Barefoot MD on 03/23/2009   Method used:   Electronically to        [facility] [address] (retail)       [phone]       [place], [place]  [address]       Ph: [phone]       Fax: [phone]   RxID:   [id number] NEURONTIN 300 MG CAPS (GABAPENTIN) Take 1 tablet in the morning and 2 tablets at bedtime  #90 x 2   Entered and Authorized by:   Hartley Barefoot MD   Signed by:   Hartley Barefoot MD on 03/23/2009   Method used:   Electronically to        [facility] [address] (retail)       [phone]       [place], [place]  [address]       Ph: [phone]       Fax: [phone]   RxID:   [id number] LANTUS 100 UNIT/ML SOLN (INSULIN GLARGINE) Inject 90  units subcutaneously once a day  #2 vials x 12   Entered and Authorized by:   Hartley Barefoot MD   Signed by:   Hartley Barefoot MD on 03/23/2009   Method used:   Electronically to        [facility] [address] (retail)       [phone]       [place], [place]  [address]       Ph: [phone]       Fax: [phone]   RxID:   [id number] LISINOPRIL 40 MG TABS (LISINOPRIL) Take 1 tablet by mouth once a day  #31  x 6   Entered and Authorized by:   Hartley Barefoot MD   Signed by:   Hartley Barefoot MD on 03/23/2009   Method used:   Electronically to        [facility] [address] (retail)       [phone]       [place], [place]  [address]       Ph: [phone]       Fax: [phone]   RxID:   [id number]   Laboratory Results   Blood Tests   Date/Time Received: March 23, 2009 9:13 AM  Date/Time Reported: Oren Beckmann  March 23, 2009 9:13 AM   HGBA1C: 10.5%   (Normal Range: Non-Diabetic - 3-6%   Control Diabetic - 6-8%) CBG Fasting:: 268mg /dL

## 2011-01-08 NOTE — Progress Notes (Signed)
Summary: diabetes support/dmr  Phone Note Call from Patient Call back at Home Phone [phone]   Caller: Patient Summary of Call: patient called to thank [place] for helpingher get test strips. she only received 100 strips for 3 months.  Initial call taken by: Jamison Neighbor RD,CDE,  Apr 21, 2009 2:58 PM  Follow-up for Phone Call        called [facility] and they will change her to insulin controlled and send her enough strisp to test 2x/day for the next year.  Follow-up by: Jamison Neighbor RD,CDE,  Apr 21, 2009 3:00 PM

## 2011-01-08 NOTE — Assessment & Plan Note (Signed)
Summary: FU CONTINUED SHOULDER PAIN/MJD   Vital Signs:  Patient profile:   65 year old female BP sitting:   139 / 83  Vitals Entered By: Lillia Pauls CMA (December 19, 2009 2:33 PM)  Primary Care Provider:  Jackson Latino MD   History of Present Illness: 65 yo F here for f/u R shoulder and R achilles tendinopathy  Rotator Cuff tendinopathy Feels almost back to where she was initially. Pain felt in front of shoulder worse with overhead activities Had been doing Codman exercises regularly until last Wednesday as she felt these were not working Had subacromial injection first visit that helped for 2-3 weeks Still cannot get comfortable in bed  RT AT Much improved with nitroglycerin Cannot wear all shoes she would like but can ambulate with little pain Not walking with a limp Has backed off nitroglycerin this past week  Allergies (verified): 1)  ! Morphine 2)  ! Codeine Sulfate (Codeine Sulfate) 3)  ! Darvon 4)  ! * Hyfromorphone (See Dose Form) 5)  ! Demerol 6)  ! * Tylox/percocet  Physical Exam  General:  alert, well-developed, well-nourished, and well-hydrated.   Msk:  R shoulder: No gross deformity. ROM limited to 140 flexion and 130 abduction - limited 2/2 pain and guarding passive ROM Full ext rotation strength mildly decreased empty can and resisted internal rotation. no drop arm + hawkins and neers  RT AT mild TTP insertion achilles Haglund's deformity noted No limp Additional Exam:  MSK u/s:  Difficult to perform - visualized subscapularis and supraspinatus of R shoulder appear intact.  Osteophytic changes of AC joint.  Large calcific bursitis subacromial space.  No other gross abnormalities.  R Achilles 0.68cm depth, much improved.  Images saved for documentation.   Impression & Recommendations:  Problem # 1:  SHOULDER PAIN, RIGHT (ICD-719.41) Assessment Deteriorated Start nitro patches over area - 1/2 patch and change every 24 hours.  Continue with  Codman exercises.  Will reassess in one month.  Consider PT, repeat injection in future.  Problem # 2:  ACHILLES TENDINITIS (ICD-726.71) Assessment: Improved Continue to wean off nitro patches for this area.  Continue with eccentric exercise program.  Complete Medication List: 1)  Lisinopril 40 Mg Tabs (Lisinopril) .... Take 1 tablet by mouth once a day 2)  Lantus 100 Unit/ml Soln (Insulin glargine) .... Inject 70  units subcutaneously once a day 3)  Neurontin 300 Mg Caps (Gabapentin) .... Take 1 tablet in the morning and 2 tablets at bedtime 4)  Prilosec 40 Mg Cpdr (Omeprazole) .... Take 1 capsule by mouth once a day 5)  Glucophage 500 Mg Tabs (Metformin hcl) .... Take 1 tablet by mouth twice  a day 6)  Prandin 1 Mg Tabs (Repaglinide) .... Take 1 tablet by mouth three times a day 7)  Furosemide 40 Mg Tabs (Furosemide) .... Take one by mouth once daily 8)  Nitro-dur 0.2 Mg/hr Pt24 (Nitroglycerin) .... Apply 1/4 patch to heel in morning and leave on 24 hours 9)  Novolog 100 Unit/ml Soln (Insulin aspart) .Marland Kitchen.. 10 units subcutaneous injection with each meal. 10)  Lipitor 20 Mg Tabs (Atorvastatin calcium) .... Take 1 tablet by mouth once a day 11)  Tramadol Hcl 50 Mg Tabs (Tramadol hcl) .... Take one by mouth two times a day as needed pain  Patient Instructions: 1)  Start 1/2 patch of nitro a day  2)  Continue exercises for your shoulder and your achilles. 3)  Follow up with [place] in 1 month.

## 2011-01-08 NOTE — Assessment & Plan Note (Signed)
Summary: F/U,MC   Vital Signs:  Patient profile:   65 year old female BP sitting:   132 / 83  Vitals Entered By: Lillia Pauls CMA (Apr 17, 2010 1:38 PM)  Primary Care Provider:  Jackson Latino MD   History of Present Illness: 65 yo F here for f/u R shoulder  Dx with rotator cuff tendinopathy and frozen shoulder She continues to make slow progress with these Some night pain Did well with PT who has since discharged her - can now get to 130 degrees flexion and more when she warms up with exercises Has done well with intraarticular cortisone injections at 3 month intervals - due for one today Compliant with home stretches and exercises Has since stopped nitro patches which were not helping much.  Allergies (verified): 1)  ! Morphine 2)  ! Codeine Sulfate (Codeine Sulfate) 3)  ! Darvon 4)  ! * Hyfromorphone (See Dose Form) 5)  ! Demerol 6)  ! * Tylox/percocet  Physical Exam  General:  alert, well-developed, well-nourished, and well-hydrated.   Msk:  R shoulder: No gross deformity. ROM limited to 140 flexion actively but close to 150-160 passively, 100 abduction actively.  Full ER bilaterally. strength mildly decreased empty can but 5/5 with resisted IR. no drop arm + hawkins and neers   Impression & Recommendations:  Problem # 1:  SHOULDER PAIN, RIGHT (ICD-719.41) Assessment Improved  Continue with home exercises and intraarticular cortisone injection - one given today.  Continue with conservative treatment.  Does not desire surgical intervention and this is unlikely to help her.  She does appear to be on the thawing side of frozen shoulder which is positive.  Supraspinatus was visualized by u/s previously and appeared intact.  After informed verbal consent patient was seated on the exam table and right shoulder was prepped with alcohol swab and utilizing posterior approach was injected intraarticularly with 3:1 marcaine:depomedrol.  She tolerated the procedure without  any immediate complications.  The following medications were removed from the medication list:    Tramadol Hcl 50 Mg Tabs (Tramadol hcl) .Marland Kitchen... Take one by mouth two times a day as needed pain  Orders: Joint Aspirate / Injection, Large (20610)  Complete Medication List: 1)  Lisinopril 40 Mg Tabs (Lisinopril) .... Take 1 tablet by mouth once a day 2)  Lantus 100 Unit/ml Soln (Insulin glargine) .... Inject 70  units subcutaneously once a day 3)  Neurontin 300 Mg Caps (Gabapentin) .... Take 1 tablet in the morning and 2 tablets at bedtime 4)  Prilosec 40 Mg Cpdr (Omeprazole) .... Take 1 capsule by mouth once a day 5)  Glucophage 500 Mg Tabs (Metformin hcl) .... Take 1 tablet by mouth twice  a day 6)  Prandin 1 Mg Tabs (Repaglinide) .... Take 1 tablet by mouth three times a day 7)  Furosemide 40 Mg Tabs (Furosemide) .... Take one by mouth once daily 8)  Nitro-dur 0.2 Mg/hr Pt24 (Nitroglycerin) .... Apply 1/4 patch to heel in morning and leave on 24 hours 9)  Novolog 100 Unit/ml Soln (Insulin aspart) .Marland Kitchen.. 10 units subcutaneous injection with each meal. 10)  Lipitor 20 Mg Tabs (Atorvastatin calcium) .... Take 1 tablet by mouth once a day

## 2011-01-08 NOTE — Assessment & Plan Note (Signed)
Summary: 4 wk f/u per dr hernandez(bhusal)/cfb   Vital Signs:  Patient Profile:   65 Years Old Female Height:     66 inches (167.64 cm) Weight:      241.5 pounds (109.77 kg) BMI:     39.12 Temp:     97.8 degrees F (36.56 degrees C) oral Pulse rate:   79 / minute BP sitting:   116 / 68  (right arm)  Pt. in pain?   yes    Location:   right flank    Intensity:   8    Type:       sharp jabbing  Vitals Entered By: Henderson Cloud (June 18, 2007 9:27 AM)              Is Patient Diabetic? Yes  Nutritional Status BMI of > 30 = obese CBG Result 135  Have you ever been in a relationship where you felt threatened, hurt or afraid?No   Does patient need assistance? Functional Status Self care Ambulation Normal   Visit Type:  Follow-up PCP:  Ronda Fairly MD  Chief Complaint:  follow-up and **PT IS FASTING**.  History of Present Illness: This is a  65  y/o woman with PMH of  Venous insufficiency Hypertension GERD Diabetes mellitus, type II Peripheral neuropathy Depression Hyperlipidemia presenting for follow up for LE edema after she has been placed on 80 mg Lasix two times a day a month and a half ago. She is also here to follow up for her DM2. Pt says cannot wear the ted hoses because they do not fit even if size is the right one for her (had them fitted). She says she has more pain when she wears them. Pain and swelling much better now since Lasix increased. Also c/o pain in her right side that changes w/ position and is worse after she sleeps on that side. Thinks she pulled a muscle.      Current Allergies (reviewed today): ! MORPHINE ! CODEINE SULFATE (CODEINE SULFATE) ! DARVON ! * HYFROMORPHONE (SEE DOSE FORM) ! DEMEROL ! * TYLOX/PERCOCET  Past Medical History:    Venous insufficiency    Hypertension    GERD    Diabetes mellitus, type II    Peripheral neuropathy    Depression    Hyperlipidemia  Past Surgical History:    Cholecystectomy 2001  Hysterectomy 2001    Oophorectomy 2001    Risk Factors: Tobacco use:  never   Review of Systems       No fever/chills/sore throat/N/V/D/C/CP/SOB/palpitations/ Has depression.   Physical Exam  General:     alert and overweight-appearing.   Lungs:     normal respiratory effort, normal breath sounds, no crackles, and no wheezes.   Heart:     normal rate and regular rhythm occasional gallop.   Abdomen:     soft, normal bowel sounds, and no distention.   TTP right side of upper abdominal quadrant.  Extremities:     nonpitting edema up to knees, painful, with dry overlying skin Neurologic:     alert & oriented X3.   Psych:     Oriented X3 and depressed affect.      Impression & Recommendations:  Problem # 1:  DIABETES MELLITUS, TYPE II (ICD-250.00) Assessment: Improved Pt is on Lantus 85 units at bedtime since 04/2007 and Metformin 500 mg at bedtime (she was on 100 two times a day but could not tolerate it b/c diarrhea). She comes for diabetes evaluation. She brings her  last month CBG log from home. The values range between 127-200, with one peak at 280. This is not too bad but not perfect either. Her last HbA1C was 8.1 and will recheck another one today. Her CBG today is 130. I will wait for the results of her HbA1c before making any changes in her regimen.  HbA1C today was 7.7, which is better than last time. I will wait for the next HbA1c to see if need to increase the dose of Lantus since the last HbA1c was only a month and a half ago. However, I think teh fact that it is decreasing is reassuring.  Her updated medication list for this problem includes:    Lisinopril 40 Mg Tabs (Lisinopril) .Marland Kitchen... Take 1 tablet by mouth once a day    Lantus 100 Unit/ml Soln (Insulin glargine) ..... Inject 85  units subcutaneously once a day    Glucophage 500 Mg Tabs (Metformin hcl) .Marland Kitchen... Take 1 tablet by mouth once a day  Orders: Capillary Blood Glucose (82948) Fingerstick (02725) Est.  Patient Level III (36644) T-Hgb A1C (in-house) (03474QV)   Problem # 2:  EDEMA LEG (ICD-782.3) Pt's BP today is great on Lasix 80 two times a day. Will maintain the dose but will also check a potassium since she did not have any BMET checked after starting this high Lasix dose. Might need to take potassium, too, however, she is on Lisinopril, so probably potassium balanced.  Her updated medication list for this problem includes:    Lasix 80 Mg Tabs (Furosemide) .Marland Kitchen... Take 1 tablet by mouth once a day  Orders: Est. Patient Level III (95638)   Problem # 3:  HYPERLIPIDEMIA (ICD-272.4) Pt's last lipid profile was in 09/2006. Will recheck since she is fasting today and will also check liver function since on Vytorin.  Her updated medication list for this problem includes:    Vytorin 10-40 Mg Tabs (Ezetimibe-simvastatin) ..... Started 01/21/06  Orders: T-Lipid Profile [phone]) T-CK Total [phone])   Other Orders: T-Comprehensive Metabolic Panel ([id number])   Patient Instructions: 1)  Please schedule a follow-up appointment in 3 months with your primary care physician for diabetes check (will recheck HbA1C).       Last LDL:                                                 191 (10/01/2006 11:07:00 AM)        Diabetic Foot Exam  Set Next Diabetic Foot Exam here: 07/30/2007    Appended Document: 4 wk f/u per dr hernandez(bhusal)/cfb    Lab Visit  Laboratory Results   Blood Tests   Date/Time Recieved: June 18, 2007 11:47 AM  Date/Time Reported: ..................................................................Marland KitchenHenderson Cloud  June 18, 2007 11:47 AM  per verbal from [facility]  HGBA1C: 7.7%   (Normal Range: Non-Diabetic - 3-6%   Control Diabetic - 6-8%)     Orders Today:

## 2011-01-08 NOTE — Consult Note (Signed)
[facility]   Imported By: Margie Billet 09/13/2010 14:15:56  _____________________________________________________________________  External Attachment:    Type:   Image     Comment:   External Document

## 2011-01-08 NOTE — Assessment & Plan Note (Signed)
Summary: fu heel/jw   Vital Signs:  Patient profile:   65 year old female BP sitting:   110 / 60  Vitals Entered By: Lillia Pauls CMA (July 11, 2009 8:51 AM)  Primary Provider:  Jackson Latino MD   History of Present Illness: Definitely improved with NTG patches and with exercises that she has been able to do more during the past month.  She does the exercises three to four times a day, standing with front of foot on a book and lifting her heels. [name] reports that the exercises has also decreased some pain she had in the center of her left foot but now feels a click when she walks.  The pain now gets to an 8/10 at its worst when she has to drive and in general is more of a 3-4/10 on a constant basis.  now able to walk more comfotabley and longer than at last visit    Allergies: 1)  ! Morphine 2)  ! Codeine Sulfate (Codeine Sulfate) 3)  ! Darvon 4)  ! * Hyfromorphone (See Dose Form) 5)  ! Demerol 6)  ! * Tylox/percocet  Social History: Divorced Never Smoked Alcohol use-no Drug use-no  recently had to retire from job 2/2 limitaitons of walking  quilting is a good relaxation  Physical Exam  General:  Obese; ,in no acute distress; alert,appropriate and cooperative throughout examination Msk:  left AT is nointender feels of normal caliber no nodules  RT AT TTP right at insertion now remainder of tendon is not TTP no nodules but still mod swelling at proximal tendon haglunds deformity noted  walks with less limp Additional Exam:  MSK [place] SCan with US shows marked improvement in tendon structure Now max width is 0.7 cm vs  ~ 1 cm in past trnasvers scan was 2.26 before and is now 1.76 there are much fewer areas of hypoechogenicity in the tendon  doppler shows less flow  the neovesel is still present at  ~ 2 cms above calcaneus  images saved   Impression & Recommendations:  Problem # 1:  ACHILLES TENDINITIS (ICD-726.71) Much improved with 30 %  reduction in width on long view 40 % reduction in width on transverse view  in light of her chronic tendonosis will cont her on NTG until tednon returns to norm size  re ck in 2 months  Complete Medication List: 1)  Lisinopril 40 Mg Tabs (Lisinopril) .... Take 1 tablet by mouth once a day 2)  Lantus 100 Unit/ml Soln (Insulin glargine) .... Inject 70  units subcutaneously once a day 3)  Neurontin 300 Mg Caps (Gabapentin) .... Take 1 tablet in the morning and 2 tablets at bedtime 4)  Prilosec 40 Mg Cpdr (Omeprazole) .... Take 1 capsule by mouth once a day 5)  Glucophage 500 Mg Tabs (Metformin hcl) .... Take 1 tablet by mouth twice  a day 6)  Prandin 1 Mg Tabs (Repaglinide) .... Take 1 tablet by mouth three times a day 7)  Furosemide 40 Mg Tabs (Furosemide) .... Take one tablet by mouth once daily 8)  Nitro-dur 0.2 Mg/hr Pt24 (Nitroglycerin) .... Apply 1/4 patch to heel in morning and leave on 24 hours 9)  Novolog 100 Unit/ml Soln (Insulin aspart) .Marland Kitchen.. 10 units subcutaneous injection with each meal. 10)  Lipitor 20 Mg Tabs (Atorvastatin calcium) .... Take 1 tablet by mouth once a day

## 2011-01-08 NOTE — Progress Notes (Signed)
Summary: Patient Assistance  Phone Note Refill Request    Follow-up for Phone Call        Refill approved-nurse to complete      Prescriptions: NEURONTIN 300 MG CAPS (GABAPENTIN) Take 1 tablet in the morning and 2 tablets at bedtime  #300 x 0   Entered and Authorized by:   Concepcion Elk   Signed by:   Ronda Fairly MD on 11/12/2007   Method used:   Samples Given   RxID:   [id number]    Patient Assist Medication Verification: Medication: Neurontin 300mg  Lot# [id number] Exp Date:08-11 Tech approval:NLS

## 2011-01-08 NOTE — Progress Notes (Signed)
Summary: refill/gg  Phone Note Refill Request  on March 19, 2010 4:40 PM  Refills Requested: Medication #1:  FUROSEMIDE 40 MG TABS take one by mouth once daily   Dosage confirmed as above?Dosage Confirmed   Last Refilled: 02/15/2010  Medication #2:  LANTUS 100 UNIT/ML SOLN Inject 70  units subcutaneously once a day   Dosage confirmed as above?Dosage Confirmed ***PT NEEDS 2 VIALS A MONTH OF LANTUS   Method Requested: Fax to Local Pharmacy Initial call taken by: Merrie Roof RN,  March 19, 2010 4:41 PM  Follow-up for Phone Call        Rx completed in Dr. Tiajuana Amass Follow-up by: Jackson Latino MD,  March 20, 2010 3:06 PM    Prescriptions: FUROSEMIDE 40 MG TABS (FUROSEMIDE) take one by mouth once daily  #31 x 6   Entered and Authorized by:   Jackson Latino MD   Signed by:   Jackson Latino MD on 03/20/2010   Method used:   Electronically to        [facility] [address] (retail)       [phone]       [place], [place]  [address]       Ph: [phone]       Fax: [phone]   RxID:   [id number] LANTUS 100 UNIT/ML SOLN (INSULIN GLARGINE) Inject 70  units subcutaneously once a day  #1 x 6   Entered and Authorized by:   Jackson Latino MD   Signed by:   Jackson Latino MD on 03/20/2010   Method used:   Electronically to        [facility] [address] (retail)       [phone]       [place], [place]  [address]       Ph: [phone]       Fax: [phone]   RxID:   [id number]   Appended Document: refill/gg Called pharmacy and increase lantus from #1 bottle a month to #2 bottles a month per order Dr Threasa Beards.  This is what pt requires as she takes 70 units a night.

## 2011-01-08 NOTE — Consult Note (Signed)
Summary: [employer] 2010 [facility]: Diabetic Eye Exam  [employer] [facility]: Diabetic Eye Exam   Imported By: Florinda Marker 09/26/2009 15:59:30  _____________________________________________________________________  External Attachment:    Type:   Image     Comment:   External Document  Appended Document: [employer] 2010 [facility]: Diabetic Eye Exam    Clinical Lists Changes  Observations: Added new observation of [id number]: 09/2010 (10/03/2009 15:29) Added new observation of DIAB EYE EX: No diabetic retinopathy.    (09/06/2009 15:30)       Diabetic Eye Exam  Procedure date:  09/06/2009  Findings:      No diabetic retinopathy.     Procedures Next Due Date:    Diabetic Eye Exam: 09/2010   Diabetic Eye Exam  Procedure date:  09/06/2009  Findings:      No diabetic retinopathy.     Procedures Next Due Date:    Diabetic Eye Exam: 09/2010

## 2011-01-08 NOTE — Progress Notes (Signed)
Summary: Refill/gh  Phone Note Refill Request Message from:  Fax from Pharmacy on November 24, 2009 3:58 PM  Refills Requested: Medication #1:  LISINOPRIL 40 MG TABS Take 1 tablet by mouth once a day   Dosage confirmed as above?Dosage Confirmed  Can the Lisinopril be changed to Accupril at no charge to the patient?   Method Requested: Fax to Local Pharmacy Initial call taken by: Angelina Ok RN,  November 24, 2009 3:58 PM    Prescriptions: LISINOPRIL 40 MG TABS (LISINOPRIL) Take 1 tablet by mouth once a day  #31 x 12   Entered and Authorized by:   Jackson Latino MD   Signed by:   Jackson Latino MD on 11/24/2009   Method used:   Faxed to ...       [facility] (retail)       [address]       [place], [place]  [address]       Ph: [phone]       Fax: [phone]   RxID:   [id number]

## 2011-01-08 NOTE — Assessment & Plan Note (Signed)
Summary: FU SHOULDER PAIN   Vital Signs:  Patient profile:   65 year old female BP sitting:   151 / 76  Vitals Entered By: Lillia Pauls CMA (October 23, 2009 9:11 AM)  Primary Provider:  Jackson Latino MD   History of Present Illness: Rotator Cuff Feels she is 90% improved still cannot sleep on that side less burning with movement able to do the ROM exercises ([place]) Not waking her from sleep feels stronger  RT AT she feels very little pain can walk normally now still using NTG with no sideeffects starting to do some Tai Chi starting to ride a stationary bike  Allergies: 1)  ! Morphine 2)  ! Codeine Sulfate (Codeine Sulfate) 3)  ! Darvon 4)  ! * Hyfromorphone (See Dose Form) 5)  ! Demerol 6)  ! * Tylox/percocet  Physical Exam  General:  Well-developed,well-nourished,in no acute distress; alert,appropriate and cooperative throughout examination Msk:  ROM of RT shopulder much improved full ext full flexion IR and ER at RT is normal at waist level IR is limited on back scratch by reath to T9 on RT and T7 on left strength is good no drop arm impingement tests are still painful with resistance  RT AT non tender to palpation much less swelling walks without limp now Additional Exam:  MSK [place]  RT AT now shows AP width of 0.66cm - this was > 1.1 cm in May and 0.77 in August with is now down to 1.76 cm vs 2.25 in May neovessels have resolved hypoechogenic areas are gone Haglunds is still present with calcification but no increased doppler flow   Impression & Recommendations:  Problem # 1:  SHOULDER PAIN, RIGHT (ICD-719.41)  Her updated medication list for this problem includes:    Tramadol Hcl 50 Mg Tabs (Tramadol hcl) .Marland Kitchen... Take one by mouth two times a day as needed pain  recheck this in 2 mos keep up ROM add easy therabeand exercises  Problem # 2:  ACHILLES TENDINITIS (ICD-726.71) This really appears about normal for her at this point on scanning  keep up exercises for AT with increase book height to 2 inches can try NTG patch every other day  Complete Medication List: 1)  Lisinopril 40 Mg Tabs (Lisinopril) .... Take 1 tablet by mouth once a day 2)  Lantus 100 Unit/ml Soln (Insulin glargine) .... Inject 70  units subcutaneously once a day 3)  Neurontin 300 Mg Caps (Gabapentin) .... Take 1 tablet in the morning and 2 tablets at bedtime 4)  Prilosec 40 Mg Cpdr (Omeprazole) .... Take 1 capsule by mouth once a day 5)  Glucophage 500 Mg Tabs (Metformin hcl) .... Take 1 tablet by mouth twice  a day 6)  Prandin 1 Mg Tabs (Repaglinide) .... Take 1 tablet by mouth three times a day 7)  Furosemide 40 Mg Tabs (Furosemide) .... Take one half tablet by mouth once daily 8)  Nitro-dur 0.2 Mg/hr Pt24 (Nitroglycerin) .... Apply 1/4 patch to heel in morning and leave on 24 hours 9)  Novolog 100 Unit/ml Soln (Insulin aspart) .Marland Kitchen.. 10 units subcutaneous injection with each meal. 10)  Lipitor 20 Mg Tabs (Atorvastatin calcium) .... Take 1 tablet by mouth once a day 11)  Tramadol Hcl 50 Mg Tabs (Tramadol hcl) .... Take one by mouth two times a day as needed pain

## 2011-01-10 NOTE — Progress Notes (Signed)
Summary: refill/gg  Phone Note Refill Request  on November 20, 2010 3:21 PM  Refills Requested: Medication #1:  LANTUS 100 UNIT/ML SOLN Inject 70  units subcutaneously once a day Please refill as 2 vials a month for this pt.   Method Requested: Electronic Initial call taken by: Merrie Roof RN,  November 20, 2010 3:21 PM  Follow-up for Phone Call        Rx completed in Dr. Tiajuana Amass Follow-up by: Deatra Robinson MD,  November 20, 2010 4:11 PM    Prescriptions: LANTUS 100 UNIT/ML SOLN (INSULIN GLARGINE) Inject 70  units subcutaneously once a day  #2 vials x 11   Entered and Authorized by:   Deatra Robinson MD   Signed by:   Deatra Robinson MD on 11/20/2010   Method used:   Electronically to        [facility] [address] (retail)       [address]       [place], [place]  [address]       Ph: [phone]       Fax: [phone]   RxID:   [id number]

## 2011-01-12 ENCOUNTER — Other Ambulatory Visit: Payer: Self-pay | Admitting: *Deleted

## 2011-01-15 MED ORDER — LISINOPRIL-HYDROCHLOROTHIAZIDE 20-25 MG PO TABS: 1.0000 | ORAL_TABLET | Freq: Every day | ORAL | Status: DC

## 2011-01-16 NOTE — Telephone Encounter (Signed)
Pt does not go to [facility] for lisinopril.  She gets this at [facility].

## 2011-02-18 LAB — GLUCOSE, CAPILLARY: Glucose-Capillary: 160 mg/dL — ABNORMAL HIGH (ref 70–99)

## 2011-02-24 LAB — GLUCOSE, CAPILLARY: Glucose-Capillary: 127 mg/dL — ABNORMAL HIGH (ref 70–99)

## 2011-02-25 LAB — GLUCOSE, CAPILLARY
Glucose-Capillary: 126 mg/dL — ABNORMAL HIGH (ref 70–99)
Glucose-Capillary: 169 mg/dL — ABNORMAL HIGH (ref 70–99)

## 2011-03-15 LAB — GLUCOSE, CAPILLARY: Glucose-Capillary: 100 mg/dL — ABNORMAL HIGH (ref 70–99)

## 2011-03-19 LAB — GLUCOSE, CAPILLARY
Glucose-Capillary: 146 mg/dL — ABNORMAL HIGH (ref 70–99)
Glucose-Capillary: 134 mg/dL — ABNORMAL HIGH (ref 70–99)

## 2011-03-20 LAB — GLUCOSE, CAPILLARY: Glucose-Capillary: 268 mg/dL — ABNORMAL HIGH (ref 70–99)

## 2011-03-24 ENCOUNTER — Encounter: Payer: Self-pay | Admitting: Internal Medicine

## 2011-03-29 ENCOUNTER — Other Ambulatory Visit: Payer: Self-pay | Admitting: *Deleted

## 2011-03-29 DIAGNOSIS — E785 Hyperlipidemia, unspecified: Secondary | ICD-10-CM

## 2011-03-31 MED ORDER — ATORVASTATIN CALCIUM 20 MG PO TABS: 20.0000 mg | ORAL_TABLET | Freq: Every day | ORAL | Status: DC

## 2011-04-01 NOTE — Telephone Encounter (Signed)
Lipitor refill request form faxed to [facility] pharmacy.

## 2011-04-26 NOTE — Op Note (Signed)
[facility]  Patient:    [name], [name]                MRN: [id number] Proc. Date: 10/06/00 Adm. Date:  [id number] Attending:  Mickle Mallory                           Operative Report  PREOPERATIVE DIAGNOSIS:       Postoperative wound infection.  POSTOPERATIVE DIAGNOSIS:      Postoperative wound infection, wound dehiscence.  OPERATION:                    Exploration of abdominal wound with debridement                               and tacking.  SURGEON:                      Brook A. Edward Jolly, M.D.  ASSISTANT:                    Angelia Mould. Derrell Lolling, M.D.  ANESTHESIA:                   General endotracheal.  IV FLUIDS:                    1600 cc of Ringers lactate.  ESTIMATED BLOOD LOSS:         Minimal.  URINE OUTPUT:                 400 cc.  COMPLICATIONS:                None.  INDICATIONS FOR PROCEDURE:    The patient is a 65 year old gravida 2, para 2 Caucasian female, status post total abdominal hysterectomy with bilateral salpingo-oophorectomy, abdominal Birch procedure, cystoscopy, suprapubic catheter placement, and posterior colporrhaphy performed on September 22, 2000, who was admitted on October 04, 2000, with evidence of wound drainage and cellulitis.  The patient had been previously treated as outpatient for cellulitis and urinary tract infection, and she was being treated with Ciprofloxacin.  The patient received Ancef antibiotic therapy, and during wound exploration on October 06, 2000, was noted to have evidence of a free Vicryl suture.  A CT scan of the abdomen and pelvis was ordered at that time, and soft tissue gases were with gas present to the level of the rectus muscles was appreciated.  There was no evidence of a subcutaneous tissue nor in the intraperitoneal cavity.  The abdominal wall was noted to be intact at that time.                                At this time, a general surgical consultation was obtained,  and the decision was made to proceed with intraoperative exploration of the wound and potential debridement.  The patients antibiotics were changed to vancomycin and Primaxin.  A discussion was held with the patient regarding her diagnosis of a postoperative wound infection and the need for further exploration and potential debridement while under anesthesia. The risks and benefits were reviewed with her, and she chose to proceed.  FINDINGS:                     A large amount of pus  was visualized and drained from the subcutaneous tissue.  There was no evidence of necrosis of either the subcutaneous tissue or along the fascia or rectus muscles.  The fascia was noted to be separated, and the rectus muscles were noted to be intact.  There was no evidence of evisceration of the bowel contents through the wound. Aerobic and anaerobic cultures were taken from the wound.  SPECIMENS:                    None.  DESCRIPTION OF PROCEDURE:     With an IV in place, the patient was taken to the operating room after she was properly identified.  The patient received general endotracheal anesthesia, and she was placed in the supine position. The abdomen was sterilely prepped, and a Foley catheter was sterilely placed inside the bladder.  She was then sterilely draped.                                The procedure began by opening the patients previous Pfannenstiel incision and extended it slightly to the left using a scalpel.  This was carried down to the level of the fascia using a combination of sharp and blunt dissection.  Cultures were obtained during this process and they were sent to the laboratory.  The extent of the incision was then visualized and palpated, and there was no evidence of any tracking of an infection beyond the limits of the previously created incision.  The tissue was then copiously irrigated and suctioned with crystalloid solution. Hemostasis was achieved using monopolar cautery  along the subcutaneous tissue. There was a small amount of the fascia along the anterior aspect of the incision which was removed.  This represented a 1 cm x 8 cm portion of tissue. Again, hemostasis was created along this fascial edge with the monopolar cautery.                                The wound was then packed with saline soaked Kerlix, and an ABD pad was placed over the incision.  The patient was cleansed of any remaining Betadine.  She was awakened and she was escorted to the recovery room in stable and awake condition.  There were no complications to the procedure. DD:  10/06/00 TD:  10/07/00 Job: [id number]

## 2011-04-26 NOTE — Discharge Summary (Signed)
[facility]  Patient:    [name], [name]                MRN: [id number] Adm. Date:  [id number] Disc. Date: [id number] Attending:  Mickle Mallory                           Discharge Summary  ADMISSION DIAGNOSES:          1. Status post total abdominal hysterectomy,                                  bilateral salpingo-oophorectomy, abdominal                                  Burch procedure, cystoscopy, suprapubic                                  catheter placement, posterior colporrhaphy on                                  September 22, 2000.                               2. Wound infection.                               3. Adult-onset diabetes mellitus.                               4. Hypertension.  DISCHARGE DIAGNOSES:          1. Status post total abdominal hysterectomy,                                  bilateral salpingo-oophorectomy, abdominal                                  Burch procedure, cystoscopy, suprapubic                                  catheter placement, and posterior                                  colporrhaphy on September 22, 2000.                               2. Wound infection.                               3. Status post wound exploration with  debridement and packing.                               4. Adult-onset diabetes mellitus.                               5. Hypertension.  OPERATIONS/PROCEDURES:        The patient underwent an exploration of the abdominal wound with debridement and packing, performed on October 06, 2000, under the direction of Dr. Conley Simmonds, with the consultation of Dr. Claud Kelp.  HISTORY OF PRESENT ILLNESS:   The patient was a 65 year old, gravida 2, para 2, Caucasian female, status post total abdominal hysterectomy with bilateral salpingo-oophorectomy, abdominal Burch procedure, cystoscopy, suprapubic catheter placement, and posterior colporrhaphy on  September 22, 2000, who was admitted to the hospital on October 04, 2000, noting wound drainage, fever, and recent outpatient treatment for wound cellulitis.  The patients medical was significant for adult-onset diabetes mellitus which was controlled with an oral hypoglycemic and hypertension.  PHYSICAL EXAMINATION:  VITAL SIGNS:                  Temperature 100.5 degrees Fahrenheit, pulse 106, respiratory rate 24, and blood pressure 115/61.  ABDOMEN:                      Intense cellulitis of the panniculus surrounding the incision.  There was a large amount of drainage that was obtained through a 1-2 cm opening in the incision.  HOSPITAL COURSE:              The patient was admitted with a wound infection and was started on intravenous Ancef therapy.  The patients admission white blood cell count was noted to be 13.8% at that time.  On October 06, 2000, the patient was noted to have a large amount of pus drainage from the right corner of the incision.  At that time, evidence of a Vicryl suture was visible through this opening of the incision.  As there was concern about potential abdominal dehiscence, a CT scan was obtained which documented soft tissue gases, where gas was present to the level of the rectus muscles.  There was no evidence of an abscess in the subcutaneous tissue nor in the intraperitoneal cavity.  The abdominal wall was noted to be intact.  At this time, a general surgery consultation was requested from Dr. Claud Kelp to evaluate the patient for any potential necrotizing fasciitis.  A recommendation was made by Dr. Derrell Lolling to proceed with a wound exploration and debridement.  The patient was, therefore, taken to the operating room on October 06, 2000, at which time she had a reopening of her abdominal wound with exploration, debridement, and packing.  Both anaerobic and aerobic cultures were obtained at this time.  There was a large amount of pus which was  visualized and drained from the subcutaneous tissue, although there was no evidence of any necrotizing fasciitis.  The fascia was noted to be separated with the rectus muscles intact underneath.  The abdominal wall demonstrated wound dehiscence but not evisceration.  Postoperatively, the patient was treated with Primaxin and vancomycin intravenously.  The patient did have low-grade temperatures during her postoperative course.  A final wound culture was positive for Prevotella bivia on October 11, 2000.  The vancomycin was, therefore, discontinued, and the Primaxin was continued.  The patient underwent t.i.d. dressing changes with a combination of saline, hydrogen peroxide, and Kerlix packing.  She required sharp debridement of the wound on November 2 and October 11, 2000.  The patient demonstrated good pink granulation tissue formation of the wound throughout the hospital course.  The patients pain postoperatively was controlled with a Dilaudid PCA.  She received both Dilaudid and Ativan prior to her dressing changes.  She was able to tolerate them well with this medication.  The patient did develop audible with evidence of rhonchi on October 08, 2000. She was treated by decreasing IV fluids, administering Lasix IV, O2 per nasal cannula, and incentive spirometry.  A chest x-ray on October 09, 2000, documented no definitive pulmonary edema, pneumothorax, or effusions.  There was evidence of a minimal linear opacity consistent with minimal atelectasis. The patient underwent nebulizer therapy on October 09, 2000, with significant improvement and resolution of her wheezing.  The patient wore PAS stockings and TEDD hose for DVT prophylaxis.  She was able to ambulate independently during her hospitalization.  The patient did tolerate a regular diet during her hospitalization.  She was given an ADA diet due to her adult-onset diabetes mellitus.  Initially she was controlled with a sliding  scale of Regular Insulin and was eventually switched to Glucotrol XL 10 mg p.o. q.d. to control her blood sugars in the 120-150 range.   The patient was assessed as ready for discharge on October 13, 2000.  Her hematocrit at the time of discharge was noted to be 30.1%, and the patient was afebrile.  The patient was discharged to home in improving condition.  The patient was not prescribed any further antibiotic therapy.  The patient will continue with dressing changes b.i.d. with saline-soaked Kerlix.  She will have a home health care nurse to perform the dressing changes and to do ongoing evaluation of the wounds.  MEDICATIONS:                  The patient received a prescription for:                               1. Darvocet-N 100 1 p.o. q.4-6h. p.r.n.                               2. Ibuprofen 600 mg p.o. q.6h.                               3. Ativan 1 mg p.o. t.i.d. p.r.n.                               4. The patient will take a multivitamin with                                  iron by mouth per day.                               5. She will continue with her Glucotrol XL 10 mg  p.o. q.d.                               6. The patient will continue with her usual                                  Zestril and hydrochlorothiazide.  DIET:                         The patient will continue with her ADA diet.  ACTIVITY:                     She will decrease her activity for a minimum of the next six weeks.  FOLLOW-UP:                    The patient will follow up in the office in two days for a wound check.  DISCHARGE INSTRUCTIONS:       She will call if she experiences problems with recurrent fever, vaginal bleeding, increased bleeding from the incision, increased abdominal pain, or any other concern. DD:  11/17/00 TD:  11/17/00 Job: [id number]

## 2011-04-26 NOTE — Discharge Summary (Signed)
[facility]  Patient:    [name], [name]                        MRN: [id number] Attending:  Debbe Bales A. Edward Jolly, M.D.                           Discharge Summary  CHIEF COMPLAINTS: 1. Perimenopausal bleeding with left lower quadrant pain. 2. Leakage of urine. 3. Pelvic organ prolapse. 4. Family history of ovarian cancer.  HISTORY OF PRESENT ILLNESS:  The patient is a 65 year old, gravida 2, para 2, Caucasian female on Prempro who presents with a 28-month history of perimenopausal menometrorrhagia and cramping in the left lower quadrant. The patient reports that the left lower quadrant discomfort is intermittent in nature and frequently occurs 1 week prior to her menstrual periods. She also reports a history of dyspareunia. The patient is status post endometrial biopsy on May 22, 2000, which documented proliferative endometrium with breakdown. A pelvic and abdominal ultrasound performed Apr 15, 2000, documented a 2.2 cm gallstone in the neck of the gallbladder, and the ultrasound of the abdomen was otherwise unremarkable, although the pancreas was not entirely seen. The uterus measured 8.32 x 5.32 x 5.69 cm upon review of the film. The right ovary measured 1.9 x 3.13 x 1.56 cm and contained a small follicle. The left ovary was not seen.  The patient also has a history of leakage of urine with coughing and sneezing. The patient does require the use of a pad and sometimes a Depends diaper, and she reports that the leakage of urine has become a social problem for her. The patient denies any history of leakage of stool, and she occasionally uses laxatives. At which time, she does have some difficulty controlling her bowels. She otherwise has some difficulty controlling her bowels.  The patient has a family history of ovarian cancer. She has a sister who succumbed to ovarian cancer at age 27.  The patient wishes for definitive therapy for the perimenopausal  bleeding and leakage of urine. She would like evaluation of the left lower quadrant pain, and she wishes for a full staging procedure if an occult cancer were to be identified at the time of her surgery.  PAST OBSTETRIC AND GYNECOLOGIC HISTORY:  Remarkable for two prior vaginal deliveries. The patient has had menarche at age 89 1/65 years old. After which time, she had a procedure performed which by history sounds to be a hymenotomy. The patient does have a history of use of a Dalkon shield. She had one abnormal Pap smear when she had the IUD in place, and the subsequent Pap smears have been normal. Her last Pap smear was August 29, 2000. She has a history of normal mammograms, and her last mammogram was performed on June 06, 2000.  PAST MEDICAL HISTORY:  Significant for: 1. Adult-onset diabetes mellitus. 2. Tinea versicolor. 3. Osteoarthritis. 4. Bone spurs in her feet. 5. Hypertension. 6. Gastroesophageal reflux. 7. Peripheral edema.  PAST SURGICAL HISTORY:  Remarkable for: 1. Status post laparoscopic cholecystectomy performed August 29, 2000. 2. Status post tonsillectomy and adenoidectomy.  CURRENT MEDICATIONS: 1. Zestril 10 mg p.o. q.d. 2. Glucotrol XL 5 mg p.o. q.d. 3. Hydrochlorothiazide 10 mg p.o. q.d. 4. Prempro 0.625/2.5 p.o. q.d. 5. Prilosec 150 mg p.o. q.d.  ALLERGIES:  The patient reports an allergy to CODEINE.  SOCIAL HISTORY:  The patient is married but separated. In the past,  she has been a Naval architect for 21 years. 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 The patient denies a history of tobacco use, alcohol use, or use of illicit drugs.  FAMILY HISTORY:  Remarkable for a sister who died of ovarian cancer at age 27. The patients maternal aunt has breast cancer. There is no family history of colon or uterine cancer.  REVIEW OF SYSTEMS:  Please refer to the history of present illness.  PHYSICAL EXAMINATION:  VITAL  SIGNS:  The patients blood pressure is 120/80.  GENERAL:   The patient is an overweight female in no acute distress.  NECK:  Negative for adenopathy or thyromegaly.  LUNGS:  Clear to auscultation bilaterally.  HEART:  Regular rate and rhythm and no evidence of a murmur, gallop, or rub.  ABDOMEN:  Obese, soft, and slightly tender to palpation in the left lower quadrant. There is no evidence of guarding or rebound. There is no evidence of hepatosplenomegaly or organomegaly.  PELVIC:  Negative inguinal adenopathy. The external genitalia and urethra are normal. There is evidence of a third-degree cystocele, first to second degree uterine prolapse, and a second-degree rectocele. The uterus is small, anteverted, mobile, and nontender. No adnexal masses nor tenderness are appreciated. The rectovaginal examination confirmed the above exam. The stool was noted to be guaiac negative.  EXTREMITIES:  Demonstrate 1+ pitting edema in the legs. The legs are nontender and without evidence of any erythema or skin breakdown.  PREOPERATIVE URODYNAMIC STUDIES:  Document genuine stress incontinence with a leak point pressure of greater than 100 cc of water. The CMG was stable. The patient had voiding with an average flow time of 8.4 ml/sec. The patients preoperative CA 125 was measured at 6.7.  IMPRESSION:  The patient is a 65 year old, gravida 2, para 2 female with perimenopausal bleeding on hormone replacement therapy, left lower quadrant pain, genuine stress incontinence, pelvic organ prolapse, a family history of ovarian cancer, and a desire for definitive therapy for the above. The patient has expressed a desire to have a full staging procedure performed at the time of surgery if an occult cancer were identified. The risks, benefits, and alternatives to surgery have been discussed with the patient, and she wishes to proceed.  The patient is, therefore, scheduled for a total abdominal  hysterectomy, bilateral salpingo-oophorectomy, abdominal Charletta Cousin procedure, cystoscopy,  suprapubic catheter placement, anterior and posterior colporrhaphy, and potential staging procedure on September 22, 2000, at [facility].DD: 09/21/00 TD:  09/21/00 Job: [id number]

## 2011-04-26 NOTE — Discharge Summary (Signed)
[facility]  Patient:    [name], [name]                MRN: [id number] Adm. Date:  [id number] Disc. Date: [id number] Attending:  Conley Simmonds A                           Discharge Summary  ADMISSION DIAGNOSES: 1. Perimenopausal bleeding. 2. Left lower quadrant pain. 3. Urinary stress incontinence. 4. Pelvic organ prolapse. 5. Family history of ovarian cancer. 6. Adult onset diabetes mellitus. 7. Hypertension.  DISCHARGE DIAGNOSES: 1. Perimenopausal bleeding. 2. Left lower quadrant pain. 3. Urinary stress incontinence. 4. Pelvic organ prolapse. 5. Family history of ovarian cancer. 6. Adult onset diabetes mellitus. 7. Hypertension. 8. Total abdominal hysterectomy with bilateral salpingo-oophorectomy,    abdominal Burch procedure, cystoscopy, suprapubic catheter placement,    posterior colporrhaphy.  PROCEDURES:  Total abdominal hysterectomy, bilateral salpingo-oophorectomy, abdominal Burch procedure, cystoscopy, suprapubic catheter placement, and posterior colporrhaphy performed under general anesthesia on September 22, 2000, under the direction of Dr. Conley Simmonds.  ADMISSION HISTORY & PHYSICAL:  The patient is a 65 year old, gravida 2, para 2, Caucasian female who has had a nine-month history of perimenopausal menometrorrhagia and cramping in the left lower quadrant.  In addition, the patient was found to have a history of spontaneous stress incontinence which was confirmed by urodynamic testing.  The patients family history was significant for ovarian cancer in a sister who succumbed to this disease at age 80.  PAST MEDICAL HISTORY:  Significant for adult onset diabetes mellitus and hypertension.  MEDICATIONS ON ADMISSION: 1. Zestril 10 mg p.o. q.d. 2. Glucotrol XL 5 mg p.o. q.d. 3. Hydrochlorothiazide 10 mg p.o. q.d. 4. Prempro 0.625 p.o. q.d. 5. Prilosec 15 mg p.o. q.d.  ALLERGIES:  The patient reported an allergy to  CODEINE.  ADMISSION PHYSICAL EXAMINATION:  PELVIC:  Exam demonstrated a third degree cystocele, first and second degree uterine prolapse, and a second degree rectocele.  The uterus was small, antegrade, mobile, and nontender.  No adnexal masses were appreciated.  RECTAL:  The rectovaginal examination confirmed bimanual exam.  Stool was noted to be guaiac negative.  LABORATORY DATA:  A preoperative CA125 was 6.7.  HOSPITAL COURSE:  The patient was admitted on September 22, 2000, for surgical evaluation and treatment for the above-noted problems.  She underwent a total abdominal hysterectomy with bilateral salpingo-oophorectomy, abdominal Burch procedure, cystoscopy, suprapubic catheter placement, and  posterior colporrhaphy under general anesthesia.  Estimated blood loss for the surgery was 250 cc, and there were no complications.  Intraoperative findings demonstrated an 8-week size and otherwise normal uterus.  The fallopian tubes and ovaries were also normal.  There was no evidence of endometriosis, adhesive disease, or peritoneal excrescences.  The upper abdomen was also void of topical pathology.  Cystoscopy done demonstrated patency of the ureters bilaterally and no evidence of any sutures in the bladder, urethra.  The patients postoperatively course was unremarkable.  She was treated with a morphine PCA which did give her pruritus.  It was discontinued and switched to a Demerol PCA.  Eventually, the patient was converted over to Darvocet and Motrin for adequate relief of her pain.  The patient did have leakage around the site of her suprapubic catheter, and it was therefore removed during her hospitalization, and a Foley bag was placed to gravity drainage.  The patients diet was slowly advanced to normal.  She was able to ambulate independently during  her hospital stay.  She did wear TED hose and compression stockings for DVT prophylaxis while she was in the hospital.  The  patients postoperative hematocrit was 32.3%.  Her final pathology report returned showing nonspecific chronic cervicitis and squamous metaplasia of the cervix.  The endometrium was benign and weakly proliferative.  There were tubo-ovarian fibrous adhesions and a left ovarian follicular cyst noted.  The patient was discharged to home in good condition on September 25, 2000.  The patient will continue on her usual medication.  Instead of the Prempro, she was switched to Premarin 0.625 mg p.o. q.d.  She was given a prescription for Darvocet-N 100 one to two p.o. q.4-6h. p.r.n. and for Motrin 600 mg p.o. q.6h. p.r.n.  The patient will take a regular diet.  She will not do any strenuous activity or heavy lifting over 10 pounds for a 77-month period.  She will place nothing in the vagina until she has had a postoperative checkup in one month.   The patient will return to the office on September 29, 2000, for a trial of voiding and for staple removal.  The patient will call the office if she experiences any problems with fever, nausea, vomiting, draining from the incision or redness around the incision, pain uncontrolled by her medications, or any other concerns. DD:  10/01/00 TD:  10/01/00 Job: [id number]

## 2011-04-26 NOTE — Discharge Summary (Signed)
[facility]  Patient:    [name], [name] Visit Number: [id number] MRN: [id number]          Service Type: SUR Location: 3E 0320 02 Attending Physician:  Brandy Hale Adm. Date:  [id number] Disc. Date: [id number]   CC:         Brook A. Edward Jolly, M.D.  Evette Georges, M.D. [facility]   Discharge Summary  FINAL DIAGNOSES: 1. Ventral incisional hernia. 2. Status post total abdominal hysterectomy and bilateral    salpingo-oophorectomy and bladder suspension with postoperative course    complicated by wound infection on 09/22/00. 3. Borderline diabetes. 4. Hypertension.  OPERATION PERFORMED:  Repair of ventral incisional hernia with polyprophyllin mesh.  Date of surgery was 07/13/01.  HISTORY OF PRESENT ILLNESS:  This is a 65 year old white female with a past history of borderline type 2 diabetes and hypertension.  She had undergone elective cholecystectomy in 2001, and has recovered uneventfully.  She underwent elective total abdominal hysterectomy and bilateral salpingo-oophorectomy and bladder suspension on September 22, 2000, by Dr. Edward Jolly, and the patient had to be hospitalized for a fairly significant wound infection in her Pfannenstiel incision which required return to the operating room for debridement.  That wound ultimately healed by secondary intention with dressing changes.  She came back to see me on May 29, 2001, because of a bulge in her lower incision for about one month which was becoming painful. She was found to have a hernia, and she desired to have this repaired, and is brought to the hospital electively.  PHYSICAL EXAMINATION:  GENERAL:  A somewhat overweight middle aged woman in no distress.  Alert, pleasant, and cooperative.  LUNGS:  Clear to auscultation.  HEART:  Regular rate and rhythm, no murmur.  ABDOMEN:  Somewhat obese, soft.  Pfannenstiel incision shows the skin has completely healed.  There is a large bulge  in the Pfannenstiel incision down very low which is completely reducible.  LABORATORY DATA:  The patient had a CT scan as an outpatient which confirmed a ventral hernia in the Pfannenstiel incision containing GI contents.  HOSPITAL COURSE:  On the day of admission the patient was taken to the operating room and underwent repair of her large ventral hernia with 12 x 8 inch piece of polyprophyllin mesh.  We repaired this through the previous transverse Pfannenstiel incision.  Postoperatively, the patient did relatively well.  She had some incisional pain, and that was controlled well with narcotic analgesics.  On the first postoperative day we were able to get her Foley out and begin a clear liquid diet.  She was started on Lovenox for deep venous thrombosis prophylaxis and began ambulation.  She had some nausea over the next 24 hours, but did begin passing flatus.  Over the next couple of days she advanced in her diet and activities without much difficulty until she was tolerating a solid diet and had a bowel movement.  Jackson-Pratt drainage was light colored, serosanguineous, no sign of bleeding in the wound, and at the time of discharge the wound was healing uneventfully.  She did have a little bit of sensitivity in her right groin, and I questioned whether she might have some neuritis in this area.  She was given a prescription for Vicodin for pain, and was taught Jackson-Pratt drain care, and asked to return to see me in the office in 4 to 6 days. DD:  07/26/01 TD:  07/27/01 Job: [id number]

## 2011-04-26 NOTE — Op Note (Signed)
[facility]  Patient:    [name], [name]                MRN: [id number] Proc. Date: 07/13/01 Adm. Date:  [id number] Attending:  Brandy Hale CC:         Brook A. Edward Jolly, M.D.  Evette Georges, M.D. [facility]   Operative Report  PREOPERATIVE DIAGNOSIS:  Ventral incisional hernia.  POSTOPERATIVE DIAGNOSIS:  Ventral incisional hernia.  OPERATION PERFORMED:  Repair of ventral incisional hernia with polypropylene mesh.  SURGEON:  Angelia Mould. Derrell Lolling, M.D.  FIRST ASSISTANT:  Rose Phi. Maple Hudson, M.D.  OPERATIVE INDICATIONS:  This is a 65 year old white female, who underwent a total abdominal hysterectomy and bilateral salpingo-oophorectomy and bladder suspension on September 22, 2000, by Dr. Edward Jolly.  The patient was rehospitalized two weeks later and had a significant wound infection that required exploration and debridement and healed slowly but uneventfully by secondary intention.  She has suffered no intra-abdominal complications.  Her voiding symptoms have resolved.  She was resumed normal activities and feels well, but she has developed a hernia in the Pfannenstiel incision which has been painful and has been becoming progressively larger.  She wanted to have something done about this.  On examination, she has a large ventral hernia in the Pfannenstiel incision in the suprapubic area, extending more so to the right than the left but quite large in any event.  She is brought to the operating room electively for repair of her ventral hernia.  OPERATIVE TECHNIQUE:  Following the induction of general endotracheal anesthesia, the patients abdomen and groins were prepped and draped in a sterile fashion.  A Foley catheter had been inserted, and intravenous antibiotics had been given.  The Pfannenstiel incision was excised. Dissection was carried down through the subcutaneous tissue, where we encountered a complex hernia sac.  We debrided the hernia  sac away from the subcutaneous tissue.  We ultimately entered the abdominal cavity.  The intra-abdominal cavity looked normal.  There were no adhesions, no signs of infection.  The colon, small bowel, and liver felt normal.  After we debrided all of the hernia sac away from the fascial edge, we undermined the subcutaneous tissue circumferentially.  Because the defect was so large, we essentially undermined the subcutaneous tissue away from the anterior abdominal wall fascia, all the way to the anterosuperior iliac spines on each direction.  Inferiorly, we took the dissection down below the symphysis pubis just a little bit, and superiorly we took it almost all the way to the umbilicus.  At this point, we had about a 4 cm rim of normal fascia all the way around.  Hemostasis was good, having been achieved with electrocautery. We closed the fascia transversely with a running suture of #1 Novofil.  The wound was irrigated with saline.  We reinforced the abdominal wall closure with an onlay graft of polypropylene mesh.  We brought a 10 inch x 14 inch piece of mesh to the operative field and cut this into an elliptical shape which was approximately 12 inches transversely by about 8 inches sagitally.  The mesh was sutured in place on top of the anterior abdominal wall fascia with interrupted mattress sutures of 0 Prolene.  We placed approximately 20 such sutures in an interrupted fashion around the rim of the mesh, smoothing the mesh out so that it covered the wound to the fascial closure quite well and extended out all the way to the edges of the subcutaneous dissection.  After inspecting this repair, we felt that it was quite secure.  We then irrigated the abdominal wound with about three liters of saline.  Hemostasis was excellent.  We placed two 19 French Blake drains in the wound and brought these out through separate stab incisions superiorly just near the umbilicus.  The drains were  sutured in place with nylon sutures and connected to suction bulbs.  The subcutaneous tissue was closed with interrupted sutures of 2-0 Vicryl.  The skin was closed with skin staples.  Clean bandages were placed and the patient taken to the recovery room in stable condition.  Estimated blood loss was about 150 cc. Complications none.  Sponge, needle, and instrument counts were correct. DD:  07/13/01 TD:  07/13/01 Job: [id number]

## 2011-04-26 NOTE — Op Note (Signed)
[facility]  Patient:    [name], [name]                MRN: [id number] Proc. Date: 09/22/00 Attending:  Debbe Bales A. Edward Jolly, M.D.                           Operative Report  PREOPERATIVE DIAGNOSES: 1. Perimenopausal bleeding. 2. Left lower quadrant pain. 3. Genuine stress incontinence. 4. Pelvic organ prolapse. 5. Family history of ovarian cancer.  POSTOPERATIVE DIAGNOSES: 1. Perimenopausal bleeding. 2. Left lower quadrant pain. 3. Genuine stress incontinence. 4. Pelvic organ prolapse. 5. Family history of ovarian cancer.  PROCEDURE:  Examination under anesthesia, total abdominal hysterectomy, bilateral salpingo-oophorectomy, abdominal burch procedure, cystoscopy, suprapubic catheter placement, and posterior colporrhaphy.  SURGEON:  Conley Simmonds, M.D.  ASSISTANT:  Lodema Hong, M.D.  ANESTHESIA:  General endotracheal.  IV FLUIDS:  2600 Ringers lactate.  ESTIMATED BLOOD LOSS:  250 cc.  URINE OUTPUT:  800 cc.  COMPLICATIONS:  None.  INDICATIONS FOR PROCEDURE:  The patient was a 65 year old, gravida 2, para 2, Caucasian female on prempro hormone replacement therapy who had a 9 month history of perimenopausal menometrorrhagia and cramping in the left lower quadrant of the abdomen. The patient also had a history of leakage of urine with coughing and sneezing and urodynamic testing confirming the presence of genuine stress incontinence. A pelvic ultrasound was noted to be unremarkable, and an endometrial biopsy was consistent with proliferative endometrium with breakdown. The patient had a family history of ovarian cancer, and the patient had a preoperative CA 125 of 6.7.  On preoperative physical examination, the patient was also noted to have evidence of prolapse of the bladder, uterus, and rectum. The patient wished for definitive surgical treatment of her gynecologic problems, and she agreed to proceed with a total abdominal  hysterectomy, bilateral salpingo-oophorectomy, abdominal burch procedure, cystoscopy, suprapubic catheter placement and potential anterior and posterior colporrhaphy after the risks, benefits, and alternatives were reviewed with her.  FINDINGS:  Examination under anesthesia revealed a third degree cystocele, first degree uterine prolapse, and a second degree rectocele. The uterus was approximately 8 week size, anteverted, and mobile. No adnexal masses were appreciated. At the time of hysterectomy, the patient was noted to have an 8 week size but otherwise normal uterus and normal tubes and ovaries. There was no evidence of any endometriosis or adhesions in the abdomen or pelvis. Upper abdominal exploration revealed a smooth liver, palpably normal kidneys, and no evidence of periaortic adenopathy, and no evidence of any excrescences of the peritoneal surfaces or along the bowel. The appendix was also visualized to be normal.  Cystoscopy performed at the end of the burch procedure demonstrated the absence of sutures in the urethra and the bladder. The bladder was visualized throughout 360 degrees, and the trigone was noted to be normal. The ureters were patent bilaterally.  SPECIMENS:  The uterus, tubes and ovaries were sent to pathology.  DESCRIPTION OF PROCEDURE:  With an IV in place, the patient was escorted to the operating room suite after she received Ancef 1 gm intravenously. The patient received a general endotracheal anesthesia, and she was then placed in the dorsal lithotomy position. The patients abdomen and vagina were sterilely prepped and then draped. A 30 cc Foley catheter was then sterilely placed inside the urinary bladder.  The procedure began with a Pfannenstiel incision created sharply with a scalpel. This was carried down to the fascia using dissection  with monopolar cautery. The fascia was then scored in the midline with a scalpel, and the incision was carried out  bilaterally with Mayo scissors. The upper borders of the fascia were then grasped with Kocher clamps and the rectus muscle was dissected from it using a combination of sharp dissection with Mayo scissors and monopolar cautery. Small perforating vessels in the fascia were cauterized with monopolar cautery for good hemostasis. The same procedure was carried out along the lower border of the fascia incision. The rectus muscles were then separated in the midline, and the incision was carried down to the pubic symphysis between them. The parietoperitoneum was then grasped with snap clamps and was entered sharply with the Metzenbaum scissors. The incision was extended cranially and caudally using the same. An exploration of the abdomen and pelvis was performed, and the findings are as noted above.  The retractor was placed in the pelvis, and moistened lap pads were used to pack the bowel into the upper abdomen. Long Kelly clamps were placed across the adnexal regions bilaterally. The round ligaments were then isolated, suture ligated with transfixing sutures of #0 Vicryl, and transected with monopolar cautery. The broad ligaments were opened bilaterally along the peritoneum using monopolar cautery, and both of the ureters were identified. The infundibulopelvic ligaments were then isolated, clamped, and ligated with a free tie of #0 Vicryl followed by a suture ligature of the same on the left and with 2 free ties o the right. Hemostasis was excellent at each operative site. The bladder flap was brought down along the peritoneum using monopolar cautery and the Metzenbaum scissors. The uterine arteries were then skeletonized bilaterally and were clamped with Heaney clamps. They were then divided with the Mayo scissors and were suture ligated with #0 Vicryl bilaterally. Hemostasis was excellent at this site. Heaney clamps were then used to prep the remaining portions of the cardinal ligaments and  the uterosacral ligaments bilaterally. A scalpel was used to divide the tissue, and transfixing sutures of #0 Vicryl were placed bilaterally. The vagina was  entered at this time, and the uterine cervix was circumscribed from the vaginal tissue with the scissors. The specimen was removed and sent to pathology. Modified Richardson angled sutures were placed bilaterally in the vaginal cuff. The vagina was then closed with a running locked suture of #0 Vicryl. The pelvis was then irrigated and suctioned of the remaining fluid. Examination of all operative sites demonstrated excellent hemostasis. The lap pads and the retractor were therefore removed from the abdomen, and the peritoneum was closed with a running suture of 2-0 Vicryl.  The abdominal burch procedure was performed at this time. A dissection was performed in the retropubic space down to the arcus tendinous fascia pelvis bilaterally. This was performed with part dissection. A double armed suture of #0 Ethibond was then brought through the paravaginal tissue lateral to the mid urethra on the patients left hand side. This was a figure-of-eight suture in the paravaginal tissue. A second figure-of-eight suture of the #0 Ethibond was placed lateral to the suture and slightly superior to it at the level of the urethrovesical junction. Each of these sutures was tied in the arcus tendinous fascia pelvis to improve hemostasis. The same procedure that was performed on the patients left hand side was then repeated on the patients right hand side. Each arm of the #0 Ethibond suture was then brought up through Coopers ligament on the ipsilateral side. All sutures were tied with elevation of the vaginal hand  from below on top of the Coopers ligament with the addition of elevation from the vaginal hand below. At the end of the burch procedure, there was an excellent hammock of support which had been created underneath the urethra  bilaterally.  Cystoscopy was performed at this time, and the findings are as noted above. The suprapubic catheter was placed under direct visualization of the laparoscope, and it was secured to the skin using #0 Prolene.  At this point, the tendinous insertion site of the rectus muscles, which had been detached, such to perform a Cherney incision for the burch, were now reattached using figure-of-eight sutures of #0 Vicryl bilaterally. The fascia itself was then closed with a running suture of #0 Vicryl. The subcutaneous tissue was irrigated with saline, and suctioned of the remaining fluid. Hemostasis was created with monopolar cautery at the site of small bleeding vessels. Interrupted sutures of 3-0 plain were placed in the subcutaneous tissue, and the skin was closed with staples. A sterile bandage was placed over the incision and over the suprapubic catheter site.  A 16 French Foley catheter was placed inside the urinary bladder, and an evaluation of the vaginal prolapse was performed. The patient was noted to have excellent support along the anterior vaginal wall. Attention was therefore turned to the posterior wall, at which time a posterior colporrhaphy was performed. The vaginal introitus was marked with Allis clamps, and an Allis clamp was placed along the perineal body in the midline. The midline of the vagina was also marked with Allis clamps up to the top of the level of the rectocele. The subvaginal tissue was injected with 1% xylocaine with 1:200,000 of epinephrine. A triangular wedge of tissue was then excised from the perineum, and the vagina was incised vertically in the midline using the Mayo scissors. The perirectal fascia was then dissected from the overlying vaginal mucosa bilaterally. Horizontal mattress sutures of #0 PDS were then used to bring the perirectal fascia together in the midline such as to reduce the rectocele. The perineal body was then brought together  by placing horizontal mattress sutures of #0 Vicryl. The excess vaginal tissue was then trimmed away, and the posterior vagina was closed with a running locked suture of 2-0 Vicryl. Rectal examination demonstrated the absence of sutures in the rectum. A vaginal packing of iodoform gauze was then placed inside the vagina, and the suprapubic catheter and Foley catheter were hooked up to gravity drainage.  The patient was taken out of the dorsal lithotomy position. She was awakened and escorted to the recovery room in stable and awake condition. There were no complications during the procedure. All needle, instrument and sponge counts were correct. DD:  09/22/00 TD:  09/22/00 Job: [id number]

## 2011-06-03 ENCOUNTER — Other Ambulatory Visit: Payer: Self-pay | Admitting: *Deleted

## 2011-06-03 MED ORDER — LISINOPRIL-HYDROCHLOROTHIAZIDE 20-25 MG PO TABS: 1.0000 | ORAL_TABLET | Freq: Every day | ORAL | Status: DC

## 2011-06-03 NOTE — Telephone Encounter (Signed)
Lisinopril -HCTZ rx refilled- request form faxed to [facility].

## 2011-06-05 ENCOUNTER — Other Ambulatory Visit: Payer: Self-pay | Admitting: Internal Medicine

## 2011-06-05 ENCOUNTER — Other Ambulatory Visit: Payer: Self-pay | Admitting: *Deleted

## 2011-06-05 MED ORDER — QUINAPRIL-HYDROCHLOROTHIAZIDE 20-25 MG PO TABS: 1.0000 | ORAL_TABLET | Freq: Every day | ORAL | Status: DC

## 2011-06-05 NOTE — Telephone Encounter (Signed)
Done

## 2011-06-05 NOTE — Telephone Encounter (Signed)
Pt can get Accuretic free of charge .

## 2011-06-05 NOTE — Telephone Encounter (Signed)
Faxed to the [facility] Department.

## 2011-06-21 ENCOUNTER — Other Ambulatory Visit: Payer: Self-pay | Admitting: Internal Medicine

## 2011-06-28 ENCOUNTER — Other Ambulatory Visit: Payer: Self-pay | Admitting: *Deleted

## 2011-06-28 DIAGNOSIS — G629 Polyneuropathy, unspecified: Secondary | ICD-10-CM

## 2011-06-29 MED ORDER — GABAPENTIN 300 MG PO CAPS: 300.0000 mg | ORAL_CAPSULE | Freq: Three times a day (TID) | ORAL | Status: DC

## 2011-07-01 NOTE — Telephone Encounter (Signed)
Called to pharm 

## 2011-07-18 ENCOUNTER — Other Ambulatory Visit: Payer: Self-pay | Admitting: Internal Medicine

## 2011-07-18 DIAGNOSIS — E119 Type 2 diabetes mellitus without complications: Secondary | ICD-10-CM

## 2011-07-26 ENCOUNTER — Other Ambulatory Visit: Payer: Self-pay | Admitting: *Deleted

## 2011-07-26 MED ORDER — ESOMEPRAZOLE MAGNESIUM 40 MG PO CPDR: 40.0000 mg | DELAYED_RELEASE_CAPSULE | Freq: Every day | ORAL | Status: DC

## 2011-07-29 ENCOUNTER — Other Ambulatory Visit: Payer: Self-pay | Admitting: *Deleted

## 2011-07-29 DIAGNOSIS — E119 Type 2 diabetes mellitus without complications: Secondary | ICD-10-CM

## 2011-07-29 MED ORDER — INSULIN ASPART 100 UNIT/ML ~~LOC~~ SOLN: 10.0000 [IU] | Freq: Three times a day (TID) | SUBCUTANEOUS | Status: DC

## 2011-07-29 NOTE — Telephone Encounter (Signed)
Called to pharm 

## 2011-07-31 NOTE — Telephone Encounter (Signed)
Refill called to the [facility].

## 2011-08-22 ENCOUNTER — Other Ambulatory Visit: Payer: Self-pay | Admitting: *Deleted

## 2011-08-22 MED ORDER — INSULIN GLARGINE 100 UNIT/ML ~~LOC~~ SOLN: 70.0000 [IU] | Freq: Every day | SUBCUTANEOUS | Status: DC

## 2011-08-22 NOTE — Telephone Encounter (Signed)
Refill faxed to the [facility] Department.

## 2011-09-13 LAB — GLUCOSE, CAPILLARY: Glucose-Capillary: 184 mg/dL — ABNORMAL HIGH (ref 70–99)

## 2011-12-10 DIAGNOSIS — K5730 Diverticulosis of large intestine without perforation or abscess without bleeding: Secondary | ICD-10-CM

## 2011-12-10 DIAGNOSIS — K648 Other hemorrhoids: Secondary | ICD-10-CM

## 2011-12-10 HISTORY — DX: Diverticulosis of large intestine without perforation or abscess without bleeding: K57.30

## 2011-12-10 HISTORY — DX: Other hemorrhoids: K64.8

## 2011-12-10 HISTORY — PX: ESOPHAGOGASTRODUODENOSCOPY: SHX1529

## 2011-12-10 HISTORY — PX: COLONOSCOPY: SHX174

## 2011-12-20 ENCOUNTER — Other Ambulatory Visit: Payer: Self-pay | Admitting: Internal Medicine

## 2012-03-17 ENCOUNTER — Other Ambulatory Visit: Payer: Self-pay | Admitting: *Deleted

## 2012-03-17 MED ORDER — ATORVASTATIN CALCIUM 20 MG PO TABS: 20.0000 mg | ORAL_TABLET | Freq: Every day | ORAL | Status: DC

## 2012-03-17 NOTE — Telephone Encounter (Signed)
Lipitor refill - rx refill request faxed to [facility].

## 2012-03-20 ENCOUNTER — Encounter: Payer: Self-pay | Admitting: Internal Medicine

## 2012-03-20 ENCOUNTER — Ambulatory Visit (INDEPENDENT_AMBULATORY_CARE_PROVIDER_SITE_OTHER): Payer: Self-pay | Admitting: Internal Medicine

## 2012-03-20 ENCOUNTER — Encounter: Payer: Self-pay | Admitting: Licensed Clinical Social Worker

## 2012-03-20 ENCOUNTER — Other Ambulatory Visit: Payer: Self-pay | Admitting: Internal Medicine

## 2012-03-20 VITALS — BP 135/76 | HR 73 | Temp 96.6°F | Ht 64.0 in | Wt 231.2 lb

## 2012-03-20 DIAGNOSIS — R1013 Epigastric pain: Secondary | ICD-10-CM

## 2012-03-20 DIAGNOSIS — K219 Gastro-esophageal reflux disease without esophagitis: Secondary | ICD-10-CM

## 2012-03-20 DIAGNOSIS — H5310 Unspecified subjective visual disturbances: Secondary | ICD-10-CM

## 2012-03-20 DIAGNOSIS — Z91120 Patient's intentional underdosing of medication regimen due to financial hardship: Secondary | ICD-10-CM

## 2012-03-20 DIAGNOSIS — Z0000 Encounter for general adult medical examination without abnormal findings: Secondary | ICD-10-CM

## 2012-03-20 DIAGNOSIS — Z79899 Other long term (current) drug therapy: Secondary | ICD-10-CM

## 2012-03-20 DIAGNOSIS — E119 Type 2 diabetes mellitus without complications: Secondary | ICD-10-CM

## 2012-03-20 DIAGNOSIS — I10 Essential (primary) hypertension: Secondary | ICD-10-CM

## 2012-03-20 DIAGNOSIS — Z1231 Encounter for screening mammogram for malignant neoplasm of breast: Secondary | ICD-10-CM

## 2012-03-20 DIAGNOSIS — G8929 Other chronic pain: Secondary | ICD-10-CM

## 2012-03-20 LAB — LIPID PANEL
Cholesterol: 147 mg/dL (ref 0–200)
HDL: 39 mg/dL — ABNORMAL LOW (ref >39)
LDL Cholesterol: 83 mg/dL (ref 0–99)
Total CHOL/HDL Ratio: 3.8 Ratio
Triglycerides: 126 mg/dL (ref <150)
VLDL: 25 mg/dL (ref 0–40)

## 2012-03-20 LAB — POCT URINALYSIS DIPSTICK
Bilirubin, UA: NEGATIVE
Blood, UA: NEGATIVE
Glucose, UA: 100
Ketones, UA: NEGATIVE
Leukocytes, UA: NEGATIVE
Nitrite, UA: NEGATIVE
Spec Grav, UA: 1.02
Urobilinogen, UA: 1
pH, UA: 5.5

## 2012-03-20 LAB — POCT GLYCOSYLATED HEMOGLOBIN (HGB A1C): Hemoglobin A1C: 9

## 2012-03-20 LAB — GLUCOSE, CAPILLARY: Glucose-Capillary: 157 mg/dL — ABNORMAL HIGH (ref 70–99)

## 2012-03-20 MED ORDER — LISINOPRIL-HYDROCHLOROTHIAZIDE 20-25 MG PO TABS: 1.0000 | ORAL_TABLET | Freq: Every day | ORAL | Status: DC

## 2012-03-20 MED ORDER — INSULIN GLARGINE 100 UNIT/ML ~~LOC~~ SOLN: 70.0000 [IU] | Freq: Every day | SUBCUTANEOUS | Status: DC

## 2012-03-20 MED ORDER — INSULIN ASPART 100 UNIT/ML ~~LOC~~ SOLN: 10.0000 [IU] | Freq: Three times a day (TID) | SUBCUTANEOUS | Status: DC

## 2012-03-20 MED ORDER — ATORVASTATIN CALCIUM 20 MG PO TABS: 20.0000 mg | ORAL_TABLET | Freq: Every day | ORAL | Status: DC

## 2012-03-20 MED ORDER — ESOMEPRAZOLE MAGNESIUM 40 MG PO CPDR: 40.0000 mg | DELAYED_RELEASE_CAPSULE | Freq: Every day | ORAL | Status: DC

## 2012-03-20 MED ORDER — GABAPENTIN 300 MG PO CAPS: 300.0000 mg | ORAL_CAPSULE | Freq: Three times a day (TID) | ORAL | Status: DC

## 2012-03-20 MED ORDER — METFORMIN HCL 500 MG PO TABS: 500.0000 mg | ORAL_TABLET | Freq: Two times a day (BID) | ORAL | Status: DC

## 2012-03-20 NOTE — Progress Notes (Signed)
[name] is a 66 y.o. female who returns to the [facility] to re-establish care.  Pt states her Medicare coverage began March 1st.  In addition to Medicare, pt has the [facility] [facility] [facility] yellow card.  Pt's physician referred pt to CSW for financial and transportation concerns.  CSW met with pt. However, pt's main concern to this worker was the abdominal pain and uncertainty of what was causing the pain and bowel incontinence.  Pt states she was at [facility] some time ago and was told she had "lesions".  Pt states she was unaware of what that meant and the appt was very upsetting. Pt states "I didn't know if I was dying or not".  Pt states now that she has Medicare she is exploring her health concerns and getting Rx needed.  Pt [facility] Rx thru [facility] and most of her medications can be obtained on the $4 listing.  Pt prefers to have 90 day prescriptions written but states "they won't give me the 90 day".  Pt states transportation is not an issue.  Pt has a good grasp of community resources available.  CSW discussed with pt that EGD should be covered at [facility], utilizing [facility] Medicare and [employer] yellow card.  Physician notified pt was concerned regarding abdominal pain and would like EGD.  Pt denied any other issue at this time.  CSW provided [employer] and explained [employer] yellow card.  Pt aware CSW is available to assist as needed.

## 2012-03-20 NOTE — Progress Notes (Signed)
Lantus, Novolog, Neurontin, Lipitor, and Nexium rxs faxed to [facility].

## 2012-03-20 NOTE — Progress Notes (Signed)
Patient ID: [name], female   DOB: 1946/02/15, 66 y.o.   MRN: [id number] HPI:    Patient is here to reestablish her care --was unable to see [place] due to lack of insurance. 1. DM. Patient states that she does not check her CBG's and deliberately takes lower doses of her insulin -"to make it last longer."  Denies any dizziness, N/V, abdominal pain, polyuria or poydypsia. 2. Right heel pain; Hx of calcaneal spur. Denies any worsening of her Sx. "just would like to have someone to take a look at." 3. Transportation issues.  4. HTN. Takes Accupril and zestoretic. She "Does not  Know why." Denies any light-headedness.  She lives alone and has a problem with a transportation. Review of Systems: Negative except per history of present illness  Physical Exam:  Nursing notes and vitals reviewed General:  alert, well-developed, and cooperative to examination.   Lungs:  normal respiratory effort, no accessory muscle use, normal breath sounds, no crackles, and no wheezes. Heart:  normal rate, regular rhythm, no murmurs, no gallop, and no rub.   Abdomen:  soft, non-tender, normal bowel sounds, no distention, no guarding, no rebound tenderness, no hepatomegaly, and no splenomegaly.   Extremities:  No cyanosis, clubbing, edema Neurologic:  alert & oriented X3, nonfocal exam  Meds: Medications Prior to Admission  Medication Sig Dispense Refill  . DISCONTD: atorvastatin (LIPITOR) 20 MG tablet Take 1 tablet (20 mg total) by mouth daily.  90 tablet  3  . DISCONTD: esomeprazole (NEXIUM) 40 MG capsule Take 1 capsule (40 mg total) by mouth daily before breakfast.  90 capsule  3  . DISCONTD: gabapentin (NEURONTIN) 300 MG capsule Take 1 capsule (300 mg total) by mouth 3 (three) times daily. Take one tablet in the morning and 2 tablets at bedtime  270 capsule  2  . DISCONTD: insulin aspart (NOVOLOG) 100 UNIT/ML injection Inject 10 Units into the skin 3 (three) times daily before meals. Take 10 units  subcutaneous injection with each meal  100 mL  11  . DISCONTD: insulin glargine (LANTUS) 100 UNIT/ML injection Inject 70 Units into the skin daily. Inject 70 units subcutaneously once a day  100 mL  0  . DISCONTD: lisinopril-hydrochlorothiazide (PRINZIDE,ZESTORETIC) 20-25 MG per tablet TAKE ONE TABLET BY MOUTH DAILY  30 tablet  10  . DISCONTD: metFORMIN (GLUCOPHAGE) 500 MG tablet TAKE 1 TABLET BY MOUTH TWICE  A DAY  60 tablet  11   No current facility-administered medications on file as of 03/20/2012.    Allergies: Codeine sulfate; Meperidine hcl; Morphine; and Propoxyphene hcl Past Medical History  Diagnosis Date  . GERD (gastroesophageal reflux disease)   . Hyperlipidemia   . Hypertension   . Diabetes mellitus   . Depression   . Peripheral neuropathy   . Venous insufficiency   . Achilles tendinitis   . Right shoulder pain     Subacromial tendinitis  . Calcaneal spur   . Ventral hernia    Past Surgical History  Procedure Date  . Cholecystectomy 2001  . Hysterectomy other 2001    TAH-BSO   Family History  Problem Relation Age of Onset  . Heart failure Father     Died in 31s   History   Social History  . Marital Status: Single    Spouse Name: N/A    Number of Children: N/A  . Years of Education: N/A   Occupational History  . Not on file.   Social History Main Topics  .  Smoking status: Not on file  . Smokeless tobacco: Not on file  . Alcohol Use: Not on file  . Drug Use: Not on file  . Sexually Active: Not on file   Other Topics Concern  . Not on file   Social History Narrative   DivorcedNever SmokedAlcohol use-noDrug use-noRecently had to retire from job 2/2 limitaitons of walkingQuilting is a good relaxationFinancial assistance application initiated. Patient needs to submit further paperwork to [facility]  March 06, 2010 2:35 PMFinancial assistance approved for 100% discount at [facility] and has [facility] cardDeborah [facility]  March 12, 2010 3:55 PM    A/P: 1.  Dm, type 2 -poorly controlled due to the patient's intentional under dosing of her her medications due to a financial strain -risks of untreated DM reviewed with the patient -Referred to SW Ms. Mosetta Putt ASAP -Referred to [facility] ASAP -urine microalbumin, FLP -referral for an opthalmologist -weight management!!!!  2. HTN -controlled -D/C zccupril-Hctz -Continue with Lisinopril-Hctz -weight management  3. PVI -patient is nonadherant with her compression stockings 'because cannot bend down and put them on." -elevate feet above heart level -low salt intake - advised someon (friend, family member) assist with stocking application  4. R eye visual disturbance -Hx of DM and HTn retinopathy -referred for an eye exam  5. Health Maintenance - mammogram -cannot afford paying for Tdap -declined  6. Chronic epigastric pain -?PUD -seen GI --needs EGD but "could not afford co-pay." -referred to SW --?[facility] referral  F/U in 6-8 weeks.

## 2012-03-20 NOTE — Patient Instructions (Signed)
Please, follow up with a mammogram and an eye exam. Please, start taking medications as prescribed. Please, follow up with me in 8 weeks and bring your glucometer with you and ALL medications you are taking!!!!

## 2012-03-21 LAB — MICROALBUMIN / CREATININE URINE RATIO
Creatinine, Urine: 102.5 mg/dL
Microalb Creat Ratio: 34.4 mg/g — ABNORMAL HIGH (ref 0.0–30.0)
Microalb, Ur: 3.53 mg/dL — ABNORMAL HIGH (ref 0.00–1.89)

## 2012-03-27 ENCOUNTER — Other Ambulatory Visit: Payer: Self-pay | Admitting: Internal Medicine

## 2012-04-02 ENCOUNTER — Ambulatory Visit (HOSPITAL_COMMUNITY)
Admission: RE | Admit: 2012-04-02 | Discharge: 2012-04-02 | Disposition: A | Discharge status: Home / Self Care | Payer: Medicare Other | Source: Ambulatory Visit | Attending: Internal Medicine | Admitting: Internal Medicine

## 2012-04-02 DIAGNOSIS — Z1231 Encounter for screening mammogram for malignant neoplasm of breast: Secondary | ICD-10-CM | POA: Insufficient documentation

## 2012-04-28 ENCOUNTER — Ambulatory Visit (INDEPENDENT_AMBULATORY_CARE_PROVIDER_SITE_OTHER): Payer: Medicare Other | Admitting: Internal Medicine

## 2012-04-28 ENCOUNTER — Encounter: Payer: Self-pay | Admitting: Internal Medicine

## 2012-04-28 VITALS — BP 116/72 | HR 79 | Temp 97.4°F | Ht 64.0 in | Wt 222.3 lb

## 2012-04-28 DIAGNOSIS — E119 Type 2 diabetes mellitus without complications: Secondary | ICD-10-CM

## 2012-04-28 DIAGNOSIS — G8929 Other chronic pain: Secondary | ICD-10-CM

## 2012-04-28 DIAGNOSIS — H539 Unspecified visual disturbance: Secondary | ICD-10-CM

## 2012-04-28 DIAGNOSIS — R109 Unspecified abdominal pain: Secondary | ICD-10-CM

## 2012-04-28 DIAGNOSIS — I10 Essential (primary) hypertension: Secondary | ICD-10-CM

## 2012-04-28 DIAGNOSIS — M7731 Calcaneal spur, right foot: Secondary | ICD-10-CM

## 2012-04-28 DIAGNOSIS — K219 Gastro-esophageal reflux disease without esophagitis: Secondary | ICD-10-CM

## 2012-04-28 LAB — GLUCOSE, CAPILLARY: Glucose-Capillary: 192 mg/dL — ABNORMAL HIGH (ref 70–99)

## 2012-04-28 MED ORDER — GABAPENTIN 300 MG PO CAPS: ORAL_CAPSULE | ORAL | Status: DC

## 2012-04-28 MED ORDER — ESOMEPRAZOLE MAGNESIUM 40 MG PO CPDR: DELAYED_RELEASE_CAPSULE | ORAL | Status: DC

## 2012-04-28 NOTE — Progress Notes (Signed)
Patient ID: [name], female   DOB: October 24, 1946, 66 y.o.   MRN: [id number] HPI:    [name] is a pleasant 66 y/o woman is evaluated for a follow up of: 1. DM, type 2 -denies any hypoglycemic events. 2. R eye visual disturbance -> has an appointment with an ophthalmologist. 3. Epigastric Chronic abdominal pain of a 2 year duration, burning, with a radiation to RUQ and R flank areas; constant, associated with nausea, 5-6/10 in intensity; denies any hematemesis, BRBPR or melena; fever, chills or rash. Denies any dizziness, black outs, dysuria, hematuria or  Vaginal discharge. Last CT of abdomen/plevis in 2011 was negative for acute pathology.  Review of Systems: Negative except per history of present illness  Physical Exam:  Nursing notes and vitals reviewed General:  alert, well-developed, and cooperative to examination.   Lungs:  normal respiratory effort, no accessory muscle use, normal breath sounds, no crackles, and no wheezes. Heart:  normal rate, regular rhythm, no murmurs, no gallop, and no rub.   Abdomen:  soft, non-tender, normal bowel sounds, no distention, no guarding, no rebound tenderness, no hepatomegaly, and no splenomegaly.  FOBT neg x1. Extremities:  No cyanosis, clubbing, edema Neurologic:  alert & oriented X3, nonfocal exam  Meds: Current Outpatient Prescriptions on File Prior to Visit  Medication Sig Dispense Refill  . atorvastatin (LIPITOR) 20 MG tablet Take 1 tablet (20 mg total) by mouth daily.  90 tablet  3  . esomeprazole (NEXIUM) 40 MG capsule Take 1 capsule (40 mg total) by mouth daily before breakfast.  90 capsule  3  . gabapentin (NEURONTIN) 300 MG capsule Take 1 capsule (300 mg total) by mouth 3 (three) times daily. Take one tablet in the morning and 2 tablets at bedtime  270 capsule  2  . LANTUS 100 UNIT/ML injection INJECT 70  UNITS SUBCUTANEOUSLY ONCE A DAY  10 mL  10  . lisinopril-hydrochlorothiazide (PRINZIDE,ZESTORETIC) 20-25 MG per tablet  Take 1 tablet by mouth daily.  30 tablet  10  . metFORMIN (GLUCOPHAGE) 500 MG tablet Take 1 tablet (500 mg total) by mouth 2 (two) times daily with a meal.  60 tablet  11  . NOVOLOG 100 UNIT/ML injection 10 UNITS SUBCUTANEOUS INJECTION WITH EACH MEAL.  10 mL  10    Allergies: Codeine sulfate; Meperidine hcl; Morphine; and Propoxyphene hcl Past Medical History  Diagnosis Date  . GERD (gastroesophageal reflux disease)   . Hyperlipidemia   . Hypertension   . Diabetes mellitus   . Depression   . Peripheral neuropathy   . Venous insufficiency   . Achilles tendinitis   . Right shoulder pain     Subacromial tendinitis  . Calcaneal spur   . Ventral hernia    Past Surgical History  Procedure Date  . Cholecystectomy 2001  . Hysterectomy other 2001    TAH-BSO   Family History  Problem Relation Age of Onset  . Heart failure Father     Died in 17s   History   Social History  . Marital Status: Single    Spouse Name: N/A    Number of Children: N/A  . Years of Education: N/A   Occupational History  . Not on file.   Social History Main Topics  . Smoking status: Never Smoker   . Smokeless tobacco: Not on file  . Alcohol Use: Not on file  . Drug Use: Not on file  . Sexually Active: Not on file   Other Topics Concern  .  Not on file   Social History Narrative   DivorcedNever SmokedAlcohol use-noDrug use-noRecently had to retire from job 2/2 limitaitons of walkingQuilting is a good relaxationFinancial assistance application initiated. Patient needs to submit further paperwork to [facility]  March 06, 2010 2:35 PMFinancial assistance approved for 100% discount at [facility] and has [facility] cardDeborah [facility]  March 12, 2010 3:55 PM   A/P:  A/P:  #. Chronic epigastric and RUQ abdominal pain in a setting of chronic Ibuprofen use and sp cholecystectomy, diverticuloses, incisional ventral hernia sp TAH. -? NSAID-induced gastritis vs PUD -patient is now insured through Medicare ->will  refer to GI for EGD ASAP -in meantime, continue with a PPI, D/C NSAID use  #. DM, type 2  -poorly controlled due to the patient's recent history of intentional under dosing of her her medications due to a financial strain . -restart anti-hyperglycemic agents as instructed -weight management!!!!   -Repeat HgbA1C in 1-2 months  #. HTN  -controlled  -Continue with Lisinopril-Hctz  -weight management   #.4. R eye visual disturbance  -Hx of DM and HTN retinopathy  -has a scheduled appointment with an pathobiologists advised to keep up with an appointment.

## 2012-04-28 NOTE — Patient Instructions (Addendum)
Please, take all your medications as prescribed. Please, follow up with a stomach specialist and a foot doctor. Please, call with any questions and follow up in 2 months.

## 2012-04-29 ENCOUNTER — Encounter: Payer: Self-pay | Admitting: Internal Medicine

## 2012-05-18 NOTE — Progress Notes (Signed)
Addended by: Neomia Dear on: 05/18/2012 07:30 PM   Modules accepted: Orders

## 2012-05-21 ENCOUNTER — Encounter: Payer: Self-pay | Admitting: Internal Medicine

## 2012-05-21 ENCOUNTER — Ambulatory Visit (INDEPENDENT_AMBULATORY_CARE_PROVIDER_SITE_OTHER): Payer: Medicare Other | Admitting: Internal Medicine

## 2012-05-21 VITALS — BP 110/64 | HR 84 | Ht 64.5 in | Wt 218.6 lb

## 2012-05-21 DIAGNOSIS — R1013 Epigastric pain: Secondary | ICD-10-CM

## 2012-05-21 DIAGNOSIS — R1011 Right upper quadrant pain: Secondary | ICD-10-CM

## 2012-05-21 DIAGNOSIS — Z1211 Encounter for screening for malignant neoplasm of colon: Secondary | ICD-10-CM

## 2012-05-21 MED ORDER — MOVIPREP 100 G PO SOLR: ORAL | Status: DC

## 2012-05-21 NOTE — Progress Notes (Signed)
Referred by: Denna Haggard, MD  Subjective:    Patient ID: [name], female    DOB: 08/27/1946, 66 y.o.   MRN: [id number]  HPI This is a pleasant middle-aged white woman who has a long-standing history of epigastric and right upper quadrant pain. It's a very sharp pain intense starts in the epigastrium and radiates around to the right upper quadrant and to the back. It is associated with bloating and distention at times. She does not have nausea and vomiting. She originally had her gallbladder removed because of this in 2001 but has continued to have problems. Subsequently she had gynecologic surgery with bladder suspension and that had a wound breakdown and herniation and required mesh hernia repair. The triggers for the pain are unclear, it is usually not eating. Twisting or moving can do that. It can occur without any obvious precipitant as well. It can last for up to a half an hour. She has tried Nexium recently without relief. I don't think she's never been on anti-spasmodic agents.  She reports chronic defecation problem since her gynecologic and bladder surgeries. She has he's manual manipulation to help defecation though she is discovered using Senokot every other day has helped her chronic constipation and defecation problems and she does not need to use manual manipulation. Prior to the Senokot she would not move her bowels for several days and then manipulate a normal to hard stool and then had several loose stools that left her feeling nauseated and sore in her abdomen. She has not had rectal bleeding. She has not had a colonoscopy.  Her GI review of systems is otherwise negative. Medications, allergies, past medical history, past surgical history, family history and social history are reviewed and updated in the EMR.  Review of Systems This is positive for back pain, pedal edema and some cough related to blood pressure medication. All other review of systems are negative  or as in the history of present illness.    Objective:   Physical Exam General:  Well-developed, well-nourished and in no acute distress - obese Eyes:  anicteric. ENT:   Mouth and posterior pharynx free of lesions, + carious teeth Neck:   supple w/o thyromegaly or mass.  Lungs: Clear to auscultation bilaterally. Heart:  S1S2, no rubs, murmurs, gallops. Abdomen:  soft, mildly tender in LLQ, no hepatosplenomegaly, or mass and BS+. Pfannenstiel incisional scar with ? Small hernia at left lateral aspect. Also small, reducible    umbilical hernia about size of marble Rectal: deferred Lymph:  no cervical or supraclavicular adenopathy. Extremities:   no edema Skin   no rash. Neuro:  A&O x 3.  Psych:  appropriate mood and  Affect. Muscskel: No rib tenderness   Data Reviewed: PCP visit note, prior operative notes.     Assessment & Plan:   1. Epigastric pain   2. RUQ pain   These are chronic issues. It's not clear from the history was causing them. I'm going to perform an upper endoscopy to rule out mucosal abnormalities that would be related. She is only been on a PPI recently. I wonder if this is not some sort of secondary issue related to her prior surgery since when she twists and moves it causes problems. She has some hernias with a small umbilical hernia and probably some herniation at the left lateral edge of her mesh repair though I don't think these are causing symptoms. She says that the symptoms are the same as she had prior to  her cholecystectomy so they may have nothing to do with her subsequent surgeries. Overall this is somewhat confusing as to what might be the cause, the crit is to be speech to a benign nature though she suffers quite a bit. If the upper endoscopy is unrevealing, consider CT scanning is she's had those, but most likely I would try an antispasmodic. IBS certainly possible.  3. Special screening for malignant neoplasms, colon   Screening colonoscopy is reasonable  appropriate. I reviewed the risks benefits and indications of this. She understands the alternatives as well. The same is true for her upper endoscopy examination.    CC: Deatra Robinson, MD

## 2012-05-21 NOTE — Patient Instructions (Addendum)
You have been scheduled for an endoscopy and colonoscopy with propofol. Please follow the written instructions given to you at your visit today. Please pick up your prep at the pharmacy within the next 1-3 days.  

## 2012-05-27 ENCOUNTER — Ambulatory Visit: Payer: Medicare Other | Admitting: Sports Medicine

## 2012-06-23 ENCOUNTER — Ambulatory Visit: Payer: Medicare Other | Admitting: Sports Medicine

## 2012-06-25 ENCOUNTER — Ambulatory Visit (INDEPENDENT_AMBULATORY_CARE_PROVIDER_SITE_OTHER): Payer: Medicare Other | Admitting: Sports Medicine

## 2012-06-25 VITALS — BP 123/80

## 2012-06-25 DIAGNOSIS — M7660 Achilles tendinitis, unspecified leg: Secondary | ICD-10-CM

## 2012-06-25 MED ORDER — NITROGLYCERIN 0.2 MG/HR TD PT24: MEDICATED_PATCH | TRANSDERMAL | Status: DC

## 2012-06-25 NOTE — Patient Instructions (Addendum)
Please stop taking Lipitor for the next 2 weeks- If this helps your muscle pain and aches - let your Doctor at Internal medicine know about this.  Please do suggested exercises 2-3 times per day  Ice and elevate 2-3 times per day  Please wear shoes with heel lifts as much as possible  Please follow up in 1 month  Thank you for seeing [place] today!

## 2012-06-25 NOTE — Progress Notes (Signed)
  Subjective:    Patient ID: [name], female    DOB: 1946-06-26, 66 y.o.   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

## 2012-06-29 NOTE — Assessment & Plan Note (Signed)
This is pretty significant with lots of chronic changes and a new partial tear I think that arose from her fall  With her DM she may not be healing well but I did not get a hx of fluoroquinolone use to know if this was a factor  Restart NTG Icing Gentle rehab exercise  I would like to reck and scan again in 1 month

## 2012-07-01 ENCOUNTER — Ambulatory Visit (AMBULATORY_SURGERY_CENTER): Payer: Medicare Other | Admitting: Internal Medicine

## 2012-07-01 ENCOUNTER — Encounter: Payer: Self-pay | Admitting: Internal Medicine

## 2012-07-01 VITALS — BP 114/70 | HR 76 | Temp 98.1°F | Resp 17 | Ht 64.5 in | Wt 218.0 lb

## 2012-07-01 DIAGNOSIS — R1011 Right upper quadrant pain: Secondary | ICD-10-CM

## 2012-07-01 DIAGNOSIS — R1013 Epigastric pain: Secondary | ICD-10-CM

## 2012-07-01 DIAGNOSIS — Z1211 Encounter for screening for malignant neoplasm of colon: Secondary | ICD-10-CM

## 2012-07-01 DIAGNOSIS — K648 Other hemorrhoids: Secondary | ICD-10-CM

## 2012-07-01 DIAGNOSIS — K5730 Diverticulosis of large intestine without perforation or abscess without bleeding: Secondary | ICD-10-CM

## 2012-07-01 LAB — GLUCOSE, CAPILLARY
Glucose-Capillary: 117 mg/dL — ABNORMAL HIGH (ref 70–99)
Glucose-Capillary: 113 mg/dL — ABNORMAL HIGH (ref 70–99)

## 2012-07-01 MED ORDER — DICYCLOMINE HCL 20 MG PO TABS: 20.0000 mg | ORAL_TABLET | Freq: Three times a day (TID) | ORAL | Status: DC

## 2012-07-01 MED ORDER — SODIUM CHLORIDE 0.9 % IV SOLN: 500.0000 mL | INTRAVENOUS | Status: DC

## 2012-07-01 NOTE — Patient Instructions (Addendum)
The esophagus, stomach and duodenum are normal. You have diverticulosis and hemorrhoids in the colon and rectum.  I think you are having spasms of the stomach and intestines causing abdominal pain. I have prescribed dicyclomine to treat that.  Please go to your pharmacy tomorrow to get that and start it. Come see me in September, call this month to get that appointment.  Thank you for choosing me and [facility].  Iva Boop, MD, FACG  YOU HAD AN ENDOSCOPIC PROCEDURE TODAY AT THE [facility]: Refer to the procedure report that was given to you for any specific questions about what was found during the examination.  If the procedure report does not answer your questions, please call your gastroenterologist to clarify.  If you requested that your care partner not be given the details of your procedure findings, then the procedure report has been included in a sealed envelope for you to review at your convenience later.  YOU SHOULD EXPECT: Some feelings of bloating in the abdomen. Passage of more gas than usual.  Walking can help get rid of the air that was put into your GI tract during the procedure and reduce the bloating. If you had a lower endoscopy (such as a colonoscopy or flexible sigmoidoscopy) you may notice spotting of blood in your stool or on the toilet paper. If you underwent a bowel prep for your procedure, then you may not have a normal bowel movement for a few days.  DIET: Your first meal following the procedure should be a light meal and then it is ok to progress to your normal diet.  A half-sandwich or bowl of soup is an example of a good first meal.  Heavy or fried foods are harder to digest and may make you feel nauseous or bloated.  Likewise meals heavy in dairy and vegetables can cause extra gas to form and this can also increase the bloating.  Drink plenty of fluids but you should avoid alcoholic beverages for 24 hours.  ACTIVITY: Your care  partner should take you home directly after the procedure.  You should plan to take it easy, moving slowly for the rest of the day.  You can resume normal activity the day after the procedure however you should NOT DRIVE or use heavy machinery for 24 hours (because of the sedation medicines used during the test).    SYMPTOMS TO REPORT IMMEDIATELY: A gastroenterologist can be reached at any hour.  During normal business hours, 8:30 AM to 5:00 PM Monday through Friday, call [phone].  After hours and on weekends, please call the GI answering service at [phone] who will take a message and have the physician on call contact you.   Following lower endoscopy (colonoscopy or flexible sigmoidoscopy):  Excessive amounts of blood in the stool  Significant tenderness or worsening of abdominal pains  Swelling of the abdomen that is new, acute  Fever of 100F or higher  Following upper endoscopy (EGD)  Vomiting of blood or coffee ground material  New chest pain or pain under the shoulder blades  Painful or persistently difficult swallowing  New shortness of breath  Fever of 100F or higher  Black, tarry-looking stools  FOLLOW UP: Our staff will call the home number listed on your records the next business day following your procedure to check on you and address any questions or concerns that you may have at that time regarding the information given to you following your procedure. This is a Research officer, political party  call and so if there is no answer at the home number and we have not heard from you through the emergency physician on call, we will assume that you have returned to your regular daily activities without incident.  SIGNATURES/CONFIDENTIALITY: You and/or your care partner have signed paperwork which will be entered into your electronic medical record.  These signatures attest to the fact that that the information above on your After Visit Summary has been reviewed and is understood.  Full  responsibility of the confidentiality of this discharge information lies with you and/or your care-partner.   Ok to resume your normal medications  Your prescription was sent to [facility]

## 2012-07-01 NOTE — Progress Notes (Signed)
Patient did not experience any of the following events: a burn prior to discharge; a fall within the facility; wrong site/side/patient/procedure/implant event; or a hospital transfer or hospital admission upon discharge from the facility. (G8907) Patient did not have preoperative order for IV antibiotic SSI prophylaxis. (G8918)  

## 2012-07-01 NOTE — Op Note (Signed)
[facility] [address] [place], [place]  [address]  COLONOSCOPY PROCEDURE REPORT  PATIENT:  [name], [name]  MR#:  [id number] BIRTHDATE:  December 18, 1945, 66 yrs. old  GENDER:  female ENDOSCOPIST:  Iva Boop, MD, [facility] REF. BYElmon Else, MD PROCEDURE DATE:  07/01/2012 PROCEDURE:  Colonoscopy 62130 ASA CLASS:  Class III INDICATIONS:  Routine Risk Screening MEDICATIONS:   There was residual sedation effect present from prior procedure., These medications were titrated to patient response per physician's verbal order, MAC sedation, administered by CRNA, propofol (Diprivan) 140 mg IV  DESCRIPTION OF PROCEDURE:   After the risks benefits and alternatives of the procedure were thoroughly explained, informed consent was obtained.  Digital rectal exam was performed and revealed no abnormalities.   The LB CF-[id number] endoscope was introduced through the anus and advanced to the cecum, which was identified by both the appendix and ileocecal valve, without limitations.  The quality of the prep was excellent, using MoviPrep.  The instrument was then slowly withdrawn as the colon was fully examined. <<PROCEDUREIMAGES>>  FINDINGS:  Severe diverticulosis was found in the left colon. This was otherwise a normal examination of the colon. Includes right colon retroflexion.   Retroflexed views in the rectum revealed internal hemorrhoids.    The time to cecum = 4:57 minutes. The scope was then withdrawn in 7:33 minutes from the cecum and the procedure completed. COMPLICATIONS:  None ENDOSCOPIC IMPRESSION: 1) Severe diverticulosis in the left colon 2) Internal hemorrhoids 3) Otherwise normal examination, excellent prep  REPEAT EXAM:  In 10 year(s) for routine screening colonoscopy.  Iva Boop, MD, [facility]  CC:  The Patient and Elmon Else, MD  n. Rosalie DoctorMarland Kitchen   Iva Boop at 07/01/2012 11:16 AM  Latrelle Dodrill, [phone]

## 2012-07-01 NOTE — Op Note (Signed)
[facility] [address] [place], [place]  [address]  ENDOSCOPY PROCEDURE REPORT  PATIENT:  [name], [name]  MR#:  [id number] BIRTHDATE:  Oct 27, 1946, 66 yrs. old  GENDER:  female  ENDOSCOPIST:  Iva Boop, MD, [facility] Referred by:          Elmon Else, MD  PROCEDURE DATE:  07/01/2012 PROCEDURE:  EGD, diagnostic [phone] ASA CLASS:  Class III INDICATIONS:  epigastric pain, right upper quadrant pain  MEDICATIONS:   These medications were titrated to patient response per physician's verbal order, MAC sedation, administered by CRNA, propofol (Diprivan) 60 mg IV, glycopyrrolate (Robinul) 0.2 mg IV TOPICAL ANESTHETIC:  Cetacaine Spray  DESCRIPTION OF PROCEDURE:   After the risks benefits and alternatives of the procedure were thoroughly explained, informed consent was obtained.  The LB GIF-H180 [id number] endoscope was introduced through the mouth and advanced to the second portion of the duodenum, without limitations.  The instrument was slowly withdrawn as the mucosa was fully examined. <<PROCEDUREIMAGES>>  The upper, middle, and distal third of the esophagus were carefully inspected and no abnormalities were noted. The z-line was well seen at the GEJ. The endoscope was pushed into the fundus which was normal including a retroflexed view. The antrum,gastric body, first and second part of the duodenum were unremarkable. Retroflexed views revealed no abnormalities.    The scope was then withdrawn from the patient and the procedure completed.  COMPLICATIONS:  None  ENDOSCOPIC IMPRESSION: 1) Normal EGD RECOMMENDATIONS: 1) Start dicyclomine 20 mg before meals - prescription sent  2) Patient to call soon and schedule an appointment to see Dr, Leone Payor in September.  REPEAT EXAM:  No  Iva Boop, MD, [facility] Graham  CC:  The Patient and Elmon Else, MD  n. Rosalie DoctorMarland Kitchen   Iva Boop at 07/01/2012 11:12 AM  Latrelle Dodrill, [phone]

## 2012-07-02 ENCOUNTER — Telehealth: Payer: Self-pay | Admitting: *Deleted

## 2012-07-02 NOTE — Telephone Encounter (Signed)
No answer. Left message to call if questions or concerns. 

## 2012-07-06 ENCOUNTER — Telehealth: Payer: Self-pay

## 2012-07-06 NOTE — Telephone Encounter (Signed)
Called and got prior approval on pts Dicyclomine 20mg  at [phone].  Lm with Karin Golden that it should go through now.

## 2012-07-23 ENCOUNTER — Other Ambulatory Visit: Payer: Self-pay | Admitting: Sports Medicine

## 2012-07-28 ENCOUNTER — Ambulatory Visit (INDEPENDENT_AMBULATORY_CARE_PROVIDER_SITE_OTHER): Payer: Medicare Other | Admitting: Sports Medicine

## 2012-07-28 VITALS — BP 110/72 | Ht 64.0 in | Wt 218.0 lb

## 2012-07-28 DIAGNOSIS — M7660 Achilles tendinitis, unspecified leg: Secondary | ICD-10-CM

## 2012-07-28 NOTE — Assessment & Plan Note (Signed)
In spite of significant tear this has improved a lot in 1 mo  We will see her in 2 mos and keep up Tx as is until then

## 2012-07-28 NOTE — Progress Notes (Signed)
  Subjective:    Patient ID: [name], female    DOB: August 21, 1946, 66 y.o.   MRN: [id number]  HPI  66 year old female with history of DM2, hyperlipidemia, HTN, venous insufficiency and chronic lower extremity edema presents for follow up of achilles tendonitis and partial tendon tear.  She has been using NTG patches daily and is also doing eccentric exercises at least three times daily.  She reports 50% improvement in pain.  She had been wearing heel lifts, but recently bougth new [employer] which have a built in heel lifts.    At last office visit she was also instructed to discontinue lipitor to see if this would improve general myalgias, mostly in the proximal muscles (shoulders and thighs).  Patient reports she has been pain free.  She plans to discuss this with her PCP at next office visit.        Review of Systems Recently underwent EGD and colonoscopy, normal upper GI and colnoscopy revealed diverticulosis and hemorrhoids.  Denies fevers, chills, back pain, knee pain.    Objective:   Physical Exam Gen - well appearing, well nourished, nad, morbidly obese HEENT - ncat, EOMI, mmm, nl conjunctiva Resp - respirations non-labored MS - RLE with 2+ edema, LLE with 1+ edema.  Right ankle reveals swelling at retrocalcaneal bursa.  TTP over achilles tendon.  Mildly decreased dorsiflexion with normal plantar flexion.  Normal Thompson's test.  [place]:  Right achilles tendon insertion site with multiple small calcifications.  Partial achilles tear involving approximately 20%. This is much improved.  Width now 0.78.  Doppler activity is normalizing.        Assessment & Plan:  11 yof returns for follow up of partial achilles tear and achilles tendonitis.  Significantly improved both clinically and on ultrasound. -  Continue NTG patches daily. -  Continue eccentric strengthening/stretching exercises. -  Wear heel lifts or current supportive shoes.  RTC in 2 months.

## 2012-07-31 ENCOUNTER — Other Ambulatory Visit: Payer: Self-pay | Admitting: Internal Medicine

## 2012-07-31 DIAGNOSIS — E119 Type 2 diabetes mellitus without complications: Secondary | ICD-10-CM

## 2012-08-27 ENCOUNTER — Encounter: Payer: Self-pay | Admitting: Internal Medicine

## 2012-08-27 ENCOUNTER — Ambulatory Visit (INDEPENDENT_AMBULATORY_CARE_PROVIDER_SITE_OTHER): Payer: Medicare Other | Admitting: Internal Medicine

## 2012-08-27 VITALS — BP 124/76 | HR 74 | Temp 96.7°F | Ht 64.0 in | Wt 219.4 lb

## 2012-08-27 DIAGNOSIS — Z23 Encounter for immunization: Secondary | ICD-10-CM

## 2012-08-27 DIAGNOSIS — L84 Corns and callosities: Secondary | ICD-10-CM

## 2012-08-27 DIAGNOSIS — R109 Unspecified abdominal pain: Secondary | ICD-10-CM

## 2012-08-27 DIAGNOSIS — E1142 Type 2 diabetes mellitus with diabetic polyneuropathy: Secondary | ICD-10-CM

## 2012-08-27 DIAGNOSIS — E1140 Type 2 diabetes mellitus with diabetic neuropathy, unspecified: Secondary | ICD-10-CM

## 2012-08-27 DIAGNOSIS — R1010 Upper abdominal pain, unspecified: Secondary | ICD-10-CM | POA: Insufficient documentation

## 2012-08-27 DIAGNOSIS — K589 Irritable bowel syndrome without diarrhea: Secondary | ICD-10-CM

## 2012-08-27 DIAGNOSIS — E119 Type 2 diabetes mellitus without complications: Secondary | ICD-10-CM

## 2012-08-27 DIAGNOSIS — Z79899 Other long term (current) drug therapy: Secondary | ICD-10-CM

## 2012-08-27 DIAGNOSIS — E1149 Type 2 diabetes mellitus with other diabetic neurological complication: Secondary | ICD-10-CM

## 2012-08-27 LAB — POCT GLYCOSYLATED HEMOGLOBIN (HGB A1C): Hemoglobin A1C: 7.6

## 2012-08-27 LAB — GLUCOSE, CAPILLARY: Glucose-Capillary: 109 mg/dL — ABNORMAL HIGH (ref 70–99)

## 2012-08-27 MED ORDER — DULOXETINE HCL 60 MG PO CPEP: 60.0000 mg | ORAL_CAPSULE | Freq: Every day | ORAL | Status: DC

## 2012-08-27 NOTE — Patient Instructions (Signed)
Please continue to watch your diet for better control of your sugars.

## 2012-08-27 NOTE — Progress Notes (Signed)
Subjective:     Patient ID: [name], female   DOB: 1946/08/20, 66 y.o.   MRN: [id number]  HPI :66 year old female with multiple complaints: Chronic abdominal pain, intermittent diarrhea ( said has lost a job in the past secondary to the same), Pain in the right eye following up with Opthalmology, left achilles tendon pain much better with Sports medicine intervention. Says she stopped taking statin secondary to muscle pains and her pains have improved after the same. She states that she is compliant with the rest of her medications.   Review of Systems  Constitutional: Negative.   HENT: Negative.   Eyes: Positive for pain.  Respiratory: Negative.   Cardiovascular: Negative.   Gastrointestinal:       Chronic right abdominal pain with chronic diarrhea.  Genitourinary: Negative.   Musculoskeletal: Negative.   Neurological: Negative.   Hematological: Negative.   Psychiatric/Behavioral: Negative.        Objective:   Physical Exam  Constitutional: She is oriented to person, place, and time. She appears well-developed.  HENT:  Head: Normocephalic and atraumatic.  Right Ear: External ear normal.  Left Ear: External ear normal.  Eyes: Conjunctivae normal are normal. Pupils are equal, round, and reactive to light.  Neck: Normal range of motion. Neck supple.  Cardiovascular: Normal rate and regular rhythm.   Pulmonary/Chest: Effort normal.  Abdominal: Soft. Bowel sounds are normal.  Musculoskeletal: Normal range of motion.  Neurological: She is alert and oriented to person, place, and time. She has normal reflexes.  Skin: Skin is intact.     Psychiatric: She has a normal mood and affect.       Assessment:    BP: well controlled on current regimen of HCTZ - Lisinopril. She expressed concern about gout attacks in her husband with HCTZ. But she does not have any s/s of Gout and safely continue on HCTZ  Hyperlipidemia: have to recheck Lipid given she has stopped taking  Statin for concern of statin induced Myopathy.  DM : Better HBA1c but still need to get it under control. She wishes to try better diet control for the same. WE will follow up in 3 months and depending on the trend go up on her medications.I am not sure why she is not on sulphonylurea.  IBD : abdominal pain and diarrhea fit into this if we rule out other causes. USG abdomen for anatomical pathology. Will refer to Cardiologist to rule out CAD if the pain is an atypical presentation of angina given female and a diabetic.  Neuropathy: says Gabapentin does not help with the pain and also says that it causes her to have slight dizziness. We will d/c the same an try duloxetine.  Immunizations : as documented.     Plan:

## 2012-08-28 ENCOUNTER — Telehealth: Payer: Self-pay | Admitting: Internal Medicine

## 2012-08-28 ENCOUNTER — Ambulatory Visit (HOSPITAL_COMMUNITY)
Admission: RE | Admit: 2012-08-28 | Discharge: 2012-08-28 | Disposition: A | Discharge status: Home / Self Care | Payer: Medicare Other | Source: Ambulatory Visit | Attending: Internal Medicine | Admitting: Internal Medicine

## 2012-08-28 DIAGNOSIS — R1010 Upper abdominal pain, unspecified: Secondary | ICD-10-CM

## 2012-08-28 DIAGNOSIS — R109 Unspecified abdominal pain: Secondary | ICD-10-CM | POA: Insufficient documentation

## 2012-08-28 DIAGNOSIS — K7689 Other specified diseases of liver: Secondary | ICD-10-CM | POA: Insufficient documentation

## 2012-08-28 DIAGNOSIS — I700 Atherosclerosis of aorta: Secondary | ICD-10-CM | POA: Insufficient documentation

## 2012-08-28 LAB — LIPID PANEL
Cholesterol: 264 mg/dL — ABNORMAL HIGH (ref 0–200)
HDL: 37 mg/dL — ABNORMAL LOW (ref >39)
LDL Cholesterol: 179 mg/dL — ABNORMAL HIGH (ref 0–99)
Total CHOL/HDL Ratio: 7.1 Ratio
Triglycerides: 240 mg/dL — ABNORMAL HIGH (ref <150)
VLDL: 48 mg/dL — ABNORMAL HIGH (ref 0–40)

## 2012-08-28 LAB — CK: Total CK: 32 U/L (ref 7–177)

## 2012-08-28 MED ORDER — PRAVASTATIN SODIUM 80 MG PO TABS: 80.0000 mg | ORAL_TABLET | Freq: Every evening | ORAL | Status: DC

## 2012-08-28 NOTE — Telephone Encounter (Signed)
Please see the telephone call notes.

## 2012-09-14 ENCOUNTER — Telehealth: Payer: Self-pay | Admitting: *Deleted

## 2012-09-14 NOTE — Telephone Encounter (Signed)
Pt calls and states she has for 4 days been having episodes of sweating profusely, chest pain, pain upper mid back and radiating to back of head and dizziness, denies shortness of breath. She is asked to go to urg care or ED now, not to drive and not to wait. She is ask to go to ED first but states she would rather go to an urg care, it is suggested she be brought to [facility] then and is agreeable

## 2012-09-14 NOTE — Telephone Encounter (Signed)
In this 66 y.o. woman with a history of diabetes, hypertension, and hyperlipidemia these constellation of symptoms are very concerning for a cardiothoracic cause and she needs immediate evaluation.  I agree with the recommendation to be evaluated in the ED.

## 2012-09-23 ENCOUNTER — Other Ambulatory Visit: Payer: Self-pay | Admitting: Sports Medicine

## 2012-09-23 ENCOUNTER — Other Ambulatory Visit: Payer: Self-pay | Admitting: Internal Medicine

## 2012-09-24 NOTE — Telephone Encounter (Signed)
Gabapentin was discontinued at last PCP visit due to side effects.  If patient would prefer to be back on medication, she will need to speak with Dr. Lonzo Cloud prior to approval.

## 2012-09-29 ENCOUNTER — Encounter: Payer: Self-pay | Admitting: Sports Medicine

## 2012-09-29 ENCOUNTER — Ambulatory Visit (INDEPENDENT_AMBULATORY_CARE_PROVIDER_SITE_OTHER): Payer: Medicare Other | Admitting: Sports Medicine

## 2012-09-29 VITALS — BP 107/70 | HR 89 | Ht 64.0 in | Wt 219.0 lb

## 2012-09-29 DIAGNOSIS — M7660 Achilles tendinitis, unspecified leg: Secondary | ICD-10-CM

## 2012-09-29 NOTE — Assessment & Plan Note (Addendum)
Based on history and ultrasound exam, her tear is resolved.  The calcifications appear smaller, but are still present.  Much less edema in and around the tendon.  Will continue NTG patch daily.  Will do seated exercises to avoid irritating the bunion.  Encouraged shoes with good arch support and slight heel lift.  Will follow up in 2-3 months.  This is a chronic condition for her and is likely to recur at times. Since this treatment protocol works well we will keep her on it at least 6 months but it may be something she needs to do long term.

## 2012-09-29 NOTE — Patient Instructions (Addendum)
Please do the seated version of the heel exercises. Please use shoes with good arch support and a slight heel. Continue to use the nitroglycerin patch. Follow up in Dec/Jan.

## 2012-09-29 NOTE — Progress Notes (Signed)
  Subjective:    Patient ID: [name], female    DOB: [birth year], 66 y.o.   MRN: [id number]  HPI Ms. Partipilo is here today for follow up of right achilles tendonitits with partial tear.  She reports doing much better, 75% better.  She is able to get up and do the things she wants to.  Has been using the nitroglycerin patch daily.  Has not been able to do the eccentric stair exercises due to a painful bunion, but has been doing some passive range of motion.   Review of Systems     Objective:   Physical Exam Gen: alert, cooperative, NAD Right ankle: no erythema or edema, no tenderness over the achilles tendon or its insertion Right foot: slight loss of longitudinal arch  MSK [place] right achilles: Achilles tendon appears intact with width of 0.77cm.  Hyperechogenic areas at the insertion of the tendon with mild surrounding hypoechogenicity.  Retrocalcaneal bursa visualized and appears to have very mild increase in hypoechogenicity. Small Tear  appears to have resolved. Overall appearance of the tendon is improved      Assessment & Plan:  Right achilles tendonitis: Improved.  Please see problem list for full assessment and plan.

## 2012-10-06 ENCOUNTER — Encounter: Payer: Self-pay | Admitting: Cardiovascular Disease

## 2012-10-06 ENCOUNTER — Ambulatory Visit (INDEPENDENT_AMBULATORY_CARE_PROVIDER_SITE_OTHER): Payer: Medicare Other | Admitting: Cardiovascular Disease

## 2012-10-06 VITALS — BP 127/73 | HR 86 | Resp 12 | Ht 60.0 in | Wt 208.0 lb

## 2012-10-06 DIAGNOSIS — R079 Chest pain, unspecified: Secondary | ICD-10-CM

## 2012-10-06 NOTE — Patient Instructions (Addendum)
Your physician recommends that you schedule a follow-up appointment as needed with Dr. McAlhany  Your physician has requested that you have an echocardiogram. Echocardiography is a painless test that uses sound waves to create images of your heart. It provides your doctor with information about the size and shape of your heart and how well your heart's chambers and valves are working. This procedure takes approximately one hour. There are no restrictions for this procedure.    

## 2012-10-06 NOTE — Progress Notes (Signed)
History of Present Illness: 66 yo female with history of DM, HTN, HLD here today as a new patient for evaluation of chest pain. She tells me that she has been having pain in her right chest wall for years. This pain occurs when she rolls over to her left side. This is resolved when she stretches. She also notices this when she is hungry. She had an EGD per Dr. Leone Payor in July 2013 and this was normal with no evidence of ulcers or esophagitis. This never occur with exertion. No exertional SOB.   Primary Care Physician: Nutan  Last Lipid Profile:Lipid Panel     Component Value Date/Time   CHOL 264* 08/27/2012 1112   TRIG 240* 08/27/2012 1112   HDL 37* 08/27/2012 1112   CHOLHDL 7.1 08/27/2012 1112   VLDL 48* 08/27/2012 1112   LDLCALC 179* 08/27/2012 1112     Past Medical History  Diagnosis Date  . GERD (gastroesophageal reflux disease)   . Hyperlipidemia   . Hypertension   . Diabetes mellitus     type II  . Depression   . Peripheral neuropathy   . Venous insufficiency   . Achilles tendinitis   . Right shoulder pain     Subacromial tendinitis  . Calcaneal spur     right  . Ventral hernia   . Cataract     Past Surgical History  Procedure Date  . Cholecystectomy 2001  . Abdominal hysterectomy 2001    TAH-BSO  . Incisional hernia repair   . Tonsillectomy     Current Outpatient Prescriptions  Medication Sig Dispense Refill  . DULoxetine (CYMBALTA) 60 MG capsule Take 1 capsule (60 mg total) by mouth daily.  30 capsule  2  . esomeprazole (NEXIUM) 40 MG capsule Take one capsule by mouth twice a day before meals.  60 capsule  3  . insulin glargine (LANTUS) 100 UNIT/ML injection Take 50 unit at bed time      . lisinopril-hydrochlorothiazide (PRINZIDE,ZESTORETIC) 20-25 MG per tablet Take 1 tablet by mouth daily.  30 tablet  10  . metFORMIN (GLUCOPHAGE) 500 MG tablet Take 1 tablet (500 mg total) by mouth 2 (two) times daily with a meal.  60 tablet  5  . nitroGLYCERIN (NITRODUR -  DOSED IN MG/24 HR) 0.2 mg/hr APPLY 1/4 PATCH DAILY FOR 24 HOURS AS DIRECTED.  CHANGE PATCH DAILY.  30 patch  1  . NOVOLOG 100 UNIT/ML injection 10 UNITS SUBCUTANEOUS INJECTION WITH EACH MEAL.  10 mL  10  . pravastatin (PRAVACHOL) 80 MG tablet Take 80 mg by mouth daily.      Marland Kitchen DISCONTD: nitroGLYCERIN (NITRODUR - DOSED IN MG/24 HR) 0.2 mg/hr APPLY 1/4 PATCH DAILY FOR 24 HOURS AS DIRECTED.  CHANGE PATCH DAILY.  30 patch  0    Allergies  Allergen Reactions  . Codeine Sulfate     REACTION: GI upset  . Meperidine Hcl     REACTION: unknown::: Demerol  . Morphine     REACTION: unknown  . Propoxyphene Hcl     REACTION: unknown    History   Social History  . Marital Status: Divorced    Spouse Name: N/A    Number of Children: 2  . Years of Education: N/A   Occupational History  . 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 Smoking status: Never Smoker   . Smokeless tobacco: Never Used  . Alcohol Use: No  . Drug Use: No  .  Sexually Active: Not on file   Other Topics Concern  . Not on file   Social History Narrative   DivorcedNever SmokedAlcohol use-noDrug use-noRecently had to retire from job 2/2 limitaitons of walkingQuilting is a good relaxationFinancial assistance application initiated. Patient needs to submit further paperwork to [facility]  March 06, 2010 2:35 PMFinancial assistance approved for 100% discount at [facility] and has [facility] cardDeborah [facility]  March 12, 2010 3:55 PM    Family History  Problem Relation Age of Onset  . Heart failure Father     Died in 46s  . Colon cancer Neg Hx   . Ovarian cancer Sister   . Diabetes Sister   . Diabetes Paternal Grandmother   . Heart failure Mother     Died in 14s  . CVA Father     Review of Systems:  As stated in the HPI and otherwise negative.   BP 127/73  Pulse 86  Resp 12  Ht 5' (1.524 m)  Wt 208 lb (94.348 kg)  BMI 40.62 kg/m2  Physical Examination: General: Well developed, well  nourished, NAD HEENT: OP clear, mucus membranes moist SKIN: warm, dry. No rashes. Neuro: No focal deficits Musculoskeletal: Muscle strength 5/5 all ext Psychiatric: Mood and affect normal Neck: No JVD, no carotid bruits, no thyromegaly, no lymphadenopathy. Lungs:Clear bilaterally, no wheezes, rhonci, crackles Cardiovascular: Regular rate and rhythm. No murmurs, gallops or rubs. Abdomen:Soft. Bowel sounds present. Non-tender.  Extremities: Trace bilateral  lower extremity edema. Pulses are 2 + in the bilateral DP/PT.  EKG: NSR, rate 86 bpm. LAFB.   Assessment and Plan:   1. Chest pain:  Atypical, right sided pain that occurs with movement. Never exertional. EKG is nearly normal. This sounds musculoskeletal. Will get an echo to make sure there is no pericardial effusion, assess LV function.

## 2012-10-08 ENCOUNTER — Ambulatory Visit (HOSPITAL_COMMUNITY): Payer: Medicare Other | Attending: Cardiovascular Disease

## 2012-10-08 DIAGNOSIS — I369 Nonrheumatic tricuspid valve disorder, unspecified: Secondary | ICD-10-CM | POA: Insufficient documentation

## 2012-10-08 DIAGNOSIS — R072 Precordial pain: Secondary | ICD-10-CM

## 2012-10-08 DIAGNOSIS — I10 Essential (primary) hypertension: Secondary | ICD-10-CM | POA: Insufficient documentation

## 2012-10-08 DIAGNOSIS — G609 Hereditary and idiopathic neuropathy, unspecified: Secondary | ICD-10-CM | POA: Insufficient documentation

## 2012-10-08 DIAGNOSIS — R079 Chest pain, unspecified: Secondary | ICD-10-CM

## 2012-10-08 DIAGNOSIS — I080 Rheumatic disorders of both mitral and aortic valves: Secondary | ICD-10-CM | POA: Insufficient documentation

## 2012-10-08 DIAGNOSIS — E119 Type 2 diabetes mellitus without complications: Secondary | ICD-10-CM | POA: Insufficient documentation

## 2012-10-08 NOTE — Progress Notes (Signed)
Echocardiogram performed.  

## 2012-10-24 ENCOUNTER — Other Ambulatory Visit: Payer: Self-pay | Admitting: Internal Medicine

## 2012-10-27 ENCOUNTER — Other Ambulatory Visit: Payer: Self-pay | Admitting: Internal Medicine

## 2012-11-24 ENCOUNTER — Ambulatory Visit (INDEPENDENT_AMBULATORY_CARE_PROVIDER_SITE_OTHER): Payer: Medicare Other | Admitting: Sports Medicine

## 2012-11-24 VITALS — BP 137/81 | Ht 65.0 in | Wt 220.0 lb

## 2012-11-24 DIAGNOSIS — M7660 Achilles tendinitis, unspecified leg: Secondary | ICD-10-CM

## 2012-11-24 NOTE — Patient Instructions (Addendum)
Continue patches daily until January 18th, then go to using patch every other day  Continue exercises  Follow-up in 2 months

## 2012-11-24 NOTE — Progress Notes (Signed)
  Subjective:    Patient ID: [name], female    DOB: 1946/06/19, 66 y.o.   MRN: [id number]  HPI She presents for follow-up of right Achilles tendonitis. She was diagnosed in July 2013. Her last visit was October 2013. She reports resolution of pain. She is doing calf strengthening exercises at home several times a day. She is also using NTG patches without adverse effects. She has been happy with her shoes withvg  Review of Systems  Allergies, medication, past medical history reviewed.  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 Significant decrease in hypoechogenic at insertion of Achilles and in Doppler activity. Calcifications and Haglund's deformity visualized. There is actually less calcification than before.  The Achilles is not as thick as in past examinations.    Assessment & Plan:

## 2012-11-24 NOTE — Assessment & Plan Note (Addendum)
Right Achilles tendinitis continues to improve -She has been on NTG patches since mid-July. Continue to mid-January, then decrease to qod. -Continue calf strengthening exercises.  -Follow-up in 2 months

## 2012-12-04 ENCOUNTER — Other Ambulatory Visit: Payer: Self-pay | Admitting: Internal Medicine

## 2012-12-05 ENCOUNTER — Other Ambulatory Visit: Payer: Self-pay | Admitting: Internal Medicine

## 2013-01-26 ENCOUNTER — Ambulatory Visit (INDEPENDENT_AMBULATORY_CARE_PROVIDER_SITE_OTHER): Payer: Medicare Other | Admitting: Sports Medicine

## 2013-01-26 ENCOUNTER — Encounter: Payer: Self-pay | Admitting: Sports Medicine

## 2013-01-26 VITALS — BP 122/80 | HR 91 | Ht 65.0 in | Wt 220.0 lb

## 2013-01-26 DIAGNOSIS — M7660 Achilles tendinitis, unspecified leg: Secondary | ICD-10-CM

## 2013-01-26 NOTE — Assessment & Plan Note (Signed)
The right Achilles tendon has returned to baseline  She is at risk of having recurrent problems since she has had this more than once  Try to keep up some of the exercises at least 3 times a week  She can use the nitroglycerin for several days at a time if she gets flares but if she has persistent pain she should return to see me  At this point she will return to followup with her primary physician

## 2013-01-26 NOTE — Progress Notes (Signed)
  Subjective:    Patient ID: [name], female    DOB: [birth year], 67 y.o.   MRN: [id number]  HPI  Pt presents to clinic for f/u of Rt achilles tendinitis which has improved. She weaned off NTG mid January, but uses when she is going to be on feet more. Continues to do heel raises on book, and AT stretches.  She feels that she has been able to return to her normal activities She is able to walk a normal amount without pain When she does use the nitroglycerin even for a few days she gets red of pain   Review of Systems     Objective:   Physical Exam  Pleasant and in no acute distress  Full range of motion of the foot and ankle Right Achilles tendon is mildly thickened but much less than before No nodule is palpated now She has no tenderness      Assessment & Plan:

## 2013-02-04 ENCOUNTER — Other Ambulatory Visit: Payer: Self-pay | Admitting: Internal Medicine

## 2013-02-04 NOTE — Telephone Encounter (Signed)
Message sent to front desk

## 2013-02-04 NOTE — Telephone Encounter (Signed)
I sent in 1 refill.  This patient has not had a Bmet since 2011.  She must have a Bmet before metformin can be refilled again.

## 2013-02-04 NOTE — Telephone Encounter (Signed)
Message sent to front desk to schedule pt a lab appt.

## 2013-02-04 NOTE — Telephone Encounter (Signed)
This patient must have an appointment within a month.  We are getting refill requests and she has no doctor listed who is still here.

## 2013-02-08 ENCOUNTER — Ambulatory Visit: Payer: Medicare Other | Admitting: Internal Medicine

## 2013-02-09 ENCOUNTER — Ambulatory Visit (INDEPENDENT_AMBULATORY_CARE_PROVIDER_SITE_OTHER): Payer: Medicare Other | Admitting: Internal Medicine

## 2013-02-09 ENCOUNTER — Encounter: Payer: Self-pay | Admitting: Internal Medicine

## 2013-02-09 VITALS — BP 126/72 | HR 82 | Temp 98.0°F | Ht 65.0 in | Wt 218.6 lb

## 2013-02-09 DIAGNOSIS — I10 Essential (primary) hypertension: Secondary | ICD-10-CM

## 2013-02-09 DIAGNOSIS — Z79899 Other long term (current) drug therapy: Secondary | ICD-10-CM

## 2013-02-09 DIAGNOSIS — K219 Gastro-esophageal reflux disease without esophagitis: Secondary | ICD-10-CM

## 2013-02-09 DIAGNOSIS — E119 Type 2 diabetes mellitus without complications: Secondary | ICD-10-CM

## 2013-02-09 DIAGNOSIS — E785 Hyperlipidemia, unspecified: Secondary | ICD-10-CM

## 2013-02-09 DIAGNOSIS — Z23 Encounter for immunization: Secondary | ICD-10-CM

## 2013-02-09 LAB — BASIC METABOLIC PANEL WITH GFR
BUN: 14 mg/dL (ref 6–23)
CO2: 28 mEq/L (ref 19–32)
Calcium: 9.1 mg/dL (ref 8.4–10.5)
Chloride: 99 mEq/L (ref 96–112)
Creat: 0.63 mg/dL (ref 0.50–1.10)
GFR, Est African American: >89 mL/min
GFR, Est Non African American: >89 mL/min
Glucose, Bld: 135 mg/dL — ABNORMAL HIGH (ref 70–99)
Potassium: 4.2 mEq/L (ref 3.5–5.3)
Sodium: 134 mEq/L — ABNORMAL LOW (ref 135–145)

## 2013-02-09 LAB — POCT GLYCOSYLATED HEMOGLOBIN (HGB A1C): Hemoglobin A1C: 8.6

## 2013-02-09 LAB — GLUCOSE, CAPILLARY: Glucose-Capillary: 142 mg/dL — ABNORMAL HIGH (ref 70–99)

## 2013-02-09 MED ORDER — INSULIN GLARGINE 100 UNIT/ML ~~LOC~~ SOLN: 70.0000 [IU] | Freq: Every day | SUBCUTANEOUS | Status: DC

## 2013-02-09 MED ORDER — METFORMIN HCL 500 MG PO TABS: 500.0000 mg | ORAL_TABLET | Freq: Two times a day (BID) | ORAL | Status: DC

## 2013-02-09 MED ORDER — PRAVASTATIN SODIUM 80 MG PO TABS: 80.0000 mg | ORAL_TABLET | Freq: Every day | ORAL | Status: DC

## 2013-02-09 MED ORDER — GABAPENTIN 300 MG PO CAPS: 300.0000 mg | ORAL_CAPSULE | Freq: Three times a day (TID) | ORAL | Status: DC

## 2013-02-09 MED ORDER — ZOSTER VACCINE LIVE 19400 UNT/0.65ML ~~LOC~~ SOLR: 0.6500 mL | Freq: Once | SUBCUTANEOUS | Status: DC

## 2013-02-09 MED ORDER — INSULIN ASPART 100 UNIT/ML ~~LOC~~ SOLN: SUBCUTANEOUS | Status: DC

## 2013-02-09 MED ORDER — LISINOPRIL-HYDROCHLOROTHIAZIDE 20-25 MG PO TABS: 1.0000 | ORAL_TABLET | Freq: Every day | ORAL | Status: DC

## 2013-02-09 MED ORDER — DULOXETINE HCL 60 MG PO CPEP: 60.0000 mg | ORAL_CAPSULE | Freq: Every day | ORAL | Status: DC

## 2013-02-09 MED ORDER — ESOMEPRAZOLE MAGNESIUM 40 MG PO CPDR: 40.0000 mg | DELAYED_RELEASE_CAPSULE | Freq: Every day | ORAL | Status: DC

## 2013-02-09 NOTE — Patient Instructions (Addendum)
We will have you come back in 3 months to check on your sugars. We would like you to call the clinic with morning and after meal blood sugars in 1 week. We have sent in refills and will restart gabapentin. Remember the first week take only 1 pill per day. The second week you can take 1 pill up to two times per day. The third week and on you can take 1 pill up to 3 times per day.   Increase your meal time insulin to 12 units with lunch and 12 units with your evening meal.   Our number is [phone].  We will give you prescription for herpes zoster shot and will give you pneumonia shot today.

## 2013-02-09 NOTE — Assessment & Plan Note (Addendum)
Lab Results  Component Value Date   HGBA1C 8.6 02/09/2013   HGBA1C 7.6 08/27/2012   HGBA1C 9.0 03/20/2012     Assessment:  Diabetes control: fair control  Progress toward A1C goal:  deteriorated  Comments:   Plan:  Medications:  Continue metformin 500 mg BID, lantus 70 units at night, novolog 10 units with breakfast and 12 units with lunch and dinner  Home glucose monitoring:   Frequency: once a day   Timing: before breakfast  Instruction/counseling given: reminded to bring blood glucose meter & log to each visit  Educational resources provided: brochure  Self management tools provided:    Other plans: Patient will call with fasting and post meal blood sugars so we can adjust regimen, given pneumonia shot at today's visit

## 2013-02-09 NOTE — Progress Notes (Signed)
Subjective:     Patient ID: [name], female   DOB: 1946-07-19, 67 y.o.   MRN: [id number]  HPI The patient is a 67 YO female who comes in today for an acute visit however has not followed up for routine care so will address at today's visit. She has PMH of achilles tendon problem, DMII, HTN, likely IBS. She has fallen once at home recently without significant injuries and she did not seek medical attention at that time. She has not been checking her blood sugars at home and states that she did see the diabetic educator at our clinic in the past and did not have a pleasant experience. 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 She has not had any hypoglycemic episodes. She is not having frequent urination or blurry vision. She is having some slight knee pain after the fall 9 days ago. The fall was mechanical in nature walking out to the car on her bumpy lawn. She has not had problems with her knee giving out since then and the pain is gradually decreasing with rest and over the counter tylenol. She does not think she needs further evaluation of that at this time. She is not having chest pain or SOB. She is not having swelling in her legs or knees. She has been out of several medications since Friday given that they would not be refilled by this clinic.   Review of Systems  Constitutional: Negative for fever, chills, diaphoresis, activity change, appetite change, fatigue and unexpected weight change.  HENT: Negative.   Eyes: Negative.   Respiratory: Negative for cough, chest tightness, shortness of breath and wheezing.   Cardiovascular: Negative for chest pain, palpitations and leg swelling.  Gastrointestinal: Positive for diarrhea and abdominal distention. Negative for nausea, vomiting, abdominal pain and constipation.       Minimal bloating, stable.  Endocrine: Negative.  Negative for polydipsia, polyphagia and polyuria.   Genitourinary: Negative.   Musculoskeletal: Positive for myalgias. Negative for back pain, joint swelling, arthralgias and gait problem.  Skin: Negative.   Neurological: Negative for dizziness, tremors, seizures, syncope, facial asymmetry, speech difficulty, weakness, light-headedness, numbness and headaches.  Psychiatric/Behavioral: Negative.        Objective:   Physical Exam  Constitutional: She is oriented to person, place, and time. She appears well-developed and well-nourished. No distress.  Obese  HENT:  Head: Normocephalic and atraumatic.  Eyes: EOM are normal. Pupils are equal, round, and reactive to light.  Neck: Normal range of motion. Neck supple. No JVD present. No thyromegaly present.  Cardiovascular: Normal rate and normal heart sounds.   No murmur heard. Pulmonary/Chest: Effort normal and breath sounds normal. No respiratory distress. She has no wheezes. She has no rales. She exhibits no tenderness.  Abdominal: Soft. Bowel sounds are normal. She exhibits distension. There is no tenderness. There is no rebound and no guarding.  Musculoskeletal: Normal range of motion. She exhibits tenderness. She exhibits no edema.  Minimal tenderness to lateral aspect of the calf directly below the knee.   Neurological: She is alert and oriented to person, place, and time. No cranial nerve deficit.  Skin: Skin is warm and dry. No rash noted. She is not diaphoretic. No erythema. No pallor.  Psychiatric: She has a normal mood and affect. Her behavior is normal.       Assessment/Plan:   1. Please see problem oriented charting.   2. Fall - No apparent  serious injury. Talked with her about getting x-ray if pain does not resolve or worsens. Not likely tendon tear. Likely to be muscular strain along the lateral calf.   3. Disposition - The patient will be seen back in 3 months. Increase novolog to 12 units with lunch and with dinner and maintain 10 units with breakfast. She is taking 70  units of lantus with bed time. Will refill metformin and unable to increase dose given diarrhea. Will have her measure fasting and post meal for 1 week and call us back so we can further adjust insulin regimen. Have spoken with her about going across the street to diabetic educator. She received pneumonia shot at today's visit and rx for zostavax. She will also be restarted on gabapentin.

## 2013-02-09 NOTE — Assessment & Plan Note (Addendum)
BP Readings from Last 3 Encounters:  02/09/13 126/72  01/26/13 122/80  11/24/12 137/81    Lab Results  Component Value Date   NA 139 04/24/2010   K 4.0 04/24/2010   CREATININE 0.70 04/24/2010    Assessment:  Blood pressure control: controlled  Progress toward BP goal:  at goal  Comments:   Plan:  Medications:  continue current medications, lisinopril/HCTZ 20/25 mg  Educational resources provided: brochure  Self management tools provided: home blood pressure logbook  Other plans: Check BMP today

## 2013-03-02 ENCOUNTER — Ambulatory Visit (INDEPENDENT_AMBULATORY_CARE_PROVIDER_SITE_OTHER): Payer: Medicare Other | Admitting: Family Medicine

## 2013-03-02 ENCOUNTER — Encounter: Payer: Self-pay | Admitting: Family Medicine

## 2013-03-02 VITALS — BP 122/78 | HR 89 | Ht 65.0 in | Wt 218.0 lb

## 2013-03-02 DIAGNOSIS — M629 Disorder of muscle, unspecified: Secondary | ICD-10-CM

## 2013-03-02 DIAGNOSIS — M7632 Iliotibial band syndrome, left leg: Secondary | ICD-10-CM

## 2013-03-02 NOTE — Patient Instructions (Signed)
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 I am giving you some exercises to do daily.  I am sending you to physical therapy as well.  Lets try tramadol for your pain at night.  I want you to come back again in 6 weeks to make sure you are doing better.  Come back sooner if your knee pain does not get better.

## 2013-03-03 DIAGNOSIS — M7630 Iliotibial band syndrome, unspecified leg: Secondary | ICD-10-CM | POA: Insufficient documentation

## 2013-03-03 NOTE — Progress Notes (Signed)
Chief complaint left lateral knee pain  History present illness: Patient is a 67 female coming in with left lateral knee pain for the last 4 days. Patient does n any true injury had been doing more housework than usual. 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 Patient states that his pain is all on the lateral aspect of the knee it only hurts when she does certain movements. Patient denies any clicking popping or giving out on her. Patient states that she feels that she is weak and is unable to use this leg sufficiently. Patient denies any radiation of the pain down the leg but has noticed that the pain on the lateral aspect is gone all the way up to her hip which is starting to give her significant amount of pain on the lateral aspect 2. Patient describes the pain more as a dull aching sensation that is constant but has an intermittent sharp stabbing sensation. Patient denies any fevers or chills.  Past Medical History  Diagnosis Date  . GERD (gastroesophageal reflux disease)   . Hyperlipidemia   . Hypertension   . Diabetes mellitus     type II  . Depression   . Peripheral neuropathy   . Venous insufficiency   . Achilles tendinitis   . Right shoulder pain     Subacromial tendinitis  . Calcaneal spur     right  . Ventral hernia   . Cataract    Past Surgical History  Procedure Laterality Date  . Cholecystectomy  2001  . Abdominal hysterectomy  2001    TAH-BSO  . Incisional hernia repair    . Tonsillectomy     Family History  Problem Relation Age of Onset  . Heart failure Father     Died in 25s  . Colon cancer Neg Hx   . Ovarian cancer Sister   . Diabetes Sister   . Diabetes Paternal Grandmother   . Heart failure Mother     Died in 62s  . CVA Father     Physical exam Blood pressure 122/78, pulse 89, height 5\' 5"  (1.651 m), weight 218 lb (98.884 kg). General: No apparent distress alert and oriented x3 mood and affect  normal Respiratory: Patient's speak in full sentences and does not appear short of breath Skin: Warm dry intact with no signs of infection or rash Neuro: Cranial nerves II through XII are intact, neurovascularly intact in all extremities with 2+ DTRs and 2+ pulses. Left hip exam: Patient is severely tender to palpation over the greater trochanteric area and does have some trace effusion. There is no erythema the skin. Patient does have a positive Pearlean Brownie that does cause significant pain down the IT band. Patient has good range of motion of the hip with no pain with internal rotation in the groin. Left knee exam: On inspection there is no gross abnormality. Patient does have full range of motion with minimal crepitus. She is severely tender to palpation over the most lateral aspect of the knee. Patient does not have true medial or lateral joint line pain. Note urged cyst appreciated. All ligaments appear to be intact and McMurray's test is negative.  After verbal and written consent patient was prepped to alcohol swabs and then injected with a spinal needle 5 cc of 1% Xylocaine and 1 cc of Medrol 80 mg into the greater trochanteric area. Patient did have relief of some of the pain at the left hip.

## 2013-03-03 NOTE — Assessment & Plan Note (Signed)
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 I did treat patients proximal IT band with a greater trochanteric bursa injection. The patient did tolerate the procedure well and had relief of some of the pain. I think after changing this as well as given her home exercise program likely her knee will get better. If for some reason his to stockinette in the next 3 weeks we can consider doing a corticosteroid injection in this area likely under ultrasound guidance. The patient will start to formal physical therapy and we'll see if these things seemed to improve. Patient was also given arch drops a day to try to help the pes planus that might help with her biomechanics that could be contributing to the iliotibial band chronic irritation.

## 2013-03-17 ENCOUNTER — Encounter: Payer: Self-pay | Admitting: Internal Medicine

## 2013-03-17 DIAGNOSIS — R269 Unspecified abnormalities of gait and mobility: Secondary | ICD-10-CM | POA: Insufficient documentation

## 2013-03-19 ENCOUNTER — Ambulatory Visit: Payer: Medicare Other

## 2013-03-23 ENCOUNTER — Other Ambulatory Visit: Payer: Self-pay | Admitting: Internal Medicine

## 2013-03-24 ENCOUNTER — Ambulatory Visit: Payer: Medicare Other | Admitting: Physical Therapy

## 2013-03-30 ENCOUNTER — Ambulatory Visit: Payer: Medicare Other | Attending: Sports Medicine

## 2013-03-30 DIAGNOSIS — R5381 Other malaise: Secondary | ICD-10-CM | POA: Insufficient documentation

## 2013-03-30 DIAGNOSIS — M25569 Pain in unspecified knee: Secondary | ICD-10-CM | POA: Insufficient documentation

## 2013-03-30 DIAGNOSIS — IMO0001 Reserved for inherently not codable concepts without codable children: Secondary | ICD-10-CM | POA: Insufficient documentation

## 2013-04-01 ENCOUNTER — Ambulatory Visit: Payer: Medicare Other | Admitting: Physical Therapy

## 2013-04-06 ENCOUNTER — Other Ambulatory Visit: Payer: Self-pay | Admitting: Internal Medicine

## 2013-04-06 ENCOUNTER — Ambulatory Visit: Payer: Medicare Other | Admitting: Physical Therapy

## 2013-04-06 DIAGNOSIS — E1149 Type 2 diabetes mellitus with other diabetic neurological complication: Secondary | ICD-10-CM

## 2013-04-06 DIAGNOSIS — I10 Essential (primary) hypertension: Secondary | ICD-10-CM

## 2013-04-08 ENCOUNTER — Ambulatory Visit: Payer: Medicare Other | Attending: Sports Medicine | Admitting: Physical Therapy

## 2013-04-08 DIAGNOSIS — M25569 Pain in unspecified knee: Secondary | ICD-10-CM | POA: Insufficient documentation

## 2013-04-08 DIAGNOSIS — R5381 Other malaise: Secondary | ICD-10-CM | POA: Insufficient documentation

## 2013-04-08 DIAGNOSIS — IMO0001 Reserved for inherently not codable concepts without codable children: Secondary | ICD-10-CM | POA: Insufficient documentation

## 2013-04-13 ENCOUNTER — Encounter: Payer: Self-pay | Admitting: Family Medicine

## 2013-04-13 ENCOUNTER — Ambulatory Visit (INDEPENDENT_AMBULATORY_CARE_PROVIDER_SITE_OTHER): Payer: Medicare Other | Admitting: Family Medicine

## 2013-04-13 ENCOUNTER — Ambulatory Visit: Payer: Medicare Other

## 2013-04-13 VITALS — BP 138/81 | Ht 65.0 in | Wt 218.0 lb

## 2013-04-13 DIAGNOSIS — M629 Disorder of muscle, unspecified: Secondary | ICD-10-CM

## 2013-04-13 DIAGNOSIS — M7632 Iliotibial band syndrome, left leg: Secondary | ICD-10-CM

## 2013-04-13 NOTE — Progress Notes (Signed)
Chief complaint: Distal ITB syndrome.  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 Patient at last visit 6 weeks ago did have a proximal greater trochanteric bursa injection which did resolve all the pain. Patient states with the aid of formal physical therapy and home exercises she continues to improve with the distal IT band syndrome as well. Patient says that the pain is not as severe but states that when she stands for significant amount of time she starts having pain on the posterior lateral aspect of the left knee and just proximal to the joint line as where she points. Patient denies any swelling or any erythema. Patient denies any medial knee pain. Patient states that she's been having some more back pain than usual as well. Patient did bring her from formal physical therapy that states that they do feel that she has some SI joint dysfunction that could be contributing. Patient denies any significant back pain and no burning sensation down her leg.  Physical exam Blood pressure 138/81, height 5\' 5"  (1.651 m), weight 218 lb (98.884 kg). General: No apparent distress alert and oriented x3 mood and affect normal  Respiratory: Patient's speak in full sentences and does not appear short of breath  Skin: Warm dry intact with no signs of infection or rash  Neuro: Cranial nerves II through XII are intact, neurovascularly intact in all extremities with 2+ DTRs and 2+ pulses.  Left hip exam:  Patient does have a positive Pearlean Brownie that does cause significant pain down the IT band. Patient has good range of motion of the hip with no pain with internal rotation in the groin.  Left knee exam: On inspection there is no gross abnormality. Patient does have full range of motion with minimal crepitus. She is mild tender to palpation over the most lateral aspect of the knee. Patient does not have true medial or lateral joint line pain. All ligaments appear to  be intact and McMurray's test is negative. Neurovascularly intact distally.

## 2013-04-13 NOTE — Assessment & Plan Note (Signed)
The patient is improving with conservative therapy. Patient would like to continue conservative therapy. Patient will continue formal physical therapy 2 times a week for the next 3 weeks focusing on the IT band as well as some of her back pain. At followup patients if she is still having pain we can consider an ultrasound-guided distal ITB an injection. This patient's back seems to be more involved than  consider further workup including an x-ray of the lumbar spine to rule out an L3-L4 impingement. Patient does not present like low back pain with radiculopathy. We will see patient again in 4 weeks.

## 2013-04-15 ENCOUNTER — Ambulatory Visit: Payer: Medicare Other | Admitting: Physical Therapy

## 2013-04-20 ENCOUNTER — Ambulatory Visit: Payer: Medicare Other

## 2013-04-22 ENCOUNTER — Ambulatory Visit: Payer: Medicare Other | Admitting: Physical Therapy

## 2013-04-27 ENCOUNTER — Ambulatory Visit: Payer: Medicare Other

## 2013-04-29 ENCOUNTER — Ambulatory Visit: Payer: Medicare Other

## 2013-05-04 ENCOUNTER — Ambulatory Visit: Payer: Medicare Other

## 2013-05-06 ENCOUNTER — Ambulatory Visit: Payer: Medicare Other

## 2013-05-11 ENCOUNTER — Ambulatory Visit: Payer: Medicare Other | Admitting: Family Medicine

## 2013-06-07 ENCOUNTER — Other Ambulatory Visit: Payer: Self-pay | Admitting: *Deleted

## 2013-06-07 DIAGNOSIS — E119 Type 2 diabetes mellitus without complications: Secondary | ICD-10-CM

## 2013-06-08 ENCOUNTER — Other Ambulatory Visit: Payer: Self-pay | Admitting: Internal Medicine

## 2013-06-08 DIAGNOSIS — E119 Type 2 diabetes mellitus without complications: Secondary | ICD-10-CM

## 2013-06-08 MED ORDER — GABAPENTIN 300 MG PO CAPS: 300.0000 mg | ORAL_CAPSULE | Freq: Three times a day (TID) | ORAL | Status: DC

## 2013-06-10 NOTE — Telephone Encounter (Signed)
Called to pharm 

## 2013-06-17 ENCOUNTER — Other Ambulatory Visit: Payer: Self-pay

## 2013-07-14 ENCOUNTER — Other Ambulatory Visit: Payer: Self-pay | Admitting: Internal Medicine

## 2013-07-30 ENCOUNTER — Ambulatory Visit (INDEPENDENT_AMBULATORY_CARE_PROVIDER_SITE_OTHER): Payer: Medicare Other | Admitting: Internal Medicine

## 2013-07-30 ENCOUNTER — Encounter: Payer: Self-pay | Admitting: Internal Medicine

## 2013-07-30 VITALS — BP 126/77 | HR 86 | Temp 97.9°F | Ht 64.25 in | Wt 223.9 lb

## 2013-07-30 DIAGNOSIS — I10 Essential (primary) hypertension: Secondary | ICD-10-CM

## 2013-07-30 DIAGNOSIS — E1149 Type 2 diabetes mellitus with other diabetic neurological complication: Secondary | ICD-10-CM

## 2013-07-30 DIAGNOSIS — I872 Venous insufficiency (chronic) (peripheral): Secondary | ICD-10-CM

## 2013-07-30 DIAGNOSIS — M7661 Achilles tendinitis, right leg: Secondary | ICD-10-CM

## 2013-07-30 DIAGNOSIS — M7660 Achilles tendinitis, unspecified leg: Secondary | ICD-10-CM

## 2013-07-30 DIAGNOSIS — R269 Unspecified abnormalities of gait and mobility: Secondary | ICD-10-CM

## 2013-07-30 DIAGNOSIS — E119 Type 2 diabetes mellitus without complications: Secondary | ICD-10-CM

## 2013-07-30 LAB — GLUCOSE, CAPILLARY: Glucose-Capillary: 162 mg/dL — ABNORMAL HIGH (ref 70–99)

## 2013-07-30 LAB — POCT GLYCOSYLATED HEMOGLOBIN (HGB A1C): Hemoglobin A1C: 8.4

## 2013-07-30 NOTE — Patient Instructions (Signed)
We are not changing your medicines today. We are still working on getting your diabetes under control and it would help if you could exercise more to help you lose some weight.   We will see you back in March unless you are sick before then call us at [phone]  Exercise to Lose Weight Exercise and a healthy diet may help you lose weight. Your doctor may suggest specific exercises. EXERCISE IDEAS AND TIPS  Choose low-cost things you enjoy doing, such as walking, bicycling, or exercising to workout videos.  Take stairs instead of the elevator.  Walk during your lunch break.  Park your car further away from work or school.  Go to a gym or an exercise class.  Start with 5 to 10 minutes of exercise each day. Build up to 30 minutes of exercise 4 to 6 days a week.  Wear shoes with good support and comfortable clothes.  Stretch before and after working out.  Work out until you breathe harder and your heart beats faster.  Drink extra water when you exercise.  Do not do so much that you hurt yourself, feel dizzy, or get very short of breath. Exercises that burn about 150 calories:  Running 1  miles in 15 minutes.  Playing volleyball for 45 to 60 minutes.  Washing and waxing a car for 45 to 60 minutes.  Playing touch football for 45 minutes.  Walking 1  miles in 35 minutes.  Pushing a stroller 1  miles in 30 minutes.  Playing basketball for 30 minutes.  Raking leaves for 30 minutes.  Bicycling 5 miles in 30 minutes.  Walking 2 miles in 30 minutes.  Dancing for 30 minutes.  Shoveling snow for 15 minutes.  Swimming laps for 20 minutes.  Walking up stairs for 15 minutes.  Bicycling 4 miles in 15 minutes.  Gardening for 30 to 45 minutes.  Jumping rope for 15 minutes.  Washing windows or floors for 45 to 60 minutes. Document Released: 12/28/2010 Document Revised: 02/17/2012 Document Reviewed: 12/28/2010 [facility] Patient Information 2014 [place], [place].

## 2013-07-31 NOTE — Assessment & Plan Note (Signed)
She does have some swelling in her legs and is improved with elevation. She has some compression stockings but has difficulty getting them on.

## 2013-07-31 NOTE — Assessment & Plan Note (Signed)
Will be referred back to sports medicine for her ankle and the discomfort.

## 2013-07-31 NOTE — Assessment & Plan Note (Signed)
Lab Results  Component Value Date   HGBA1C 8.4 07/30/2013   HGBA1C 8.6 02/09/2013   HGBA1C 7.6 08/27/2012     Assessment: Diabetes control: fair control Progress toward A1C goal:  unchanged Comments:   Plan: Medications:  continue current medications, novolog 12 units TID, lantus 70 QHS metformin 500 bid Home glucose monitoring: Frequency:   Timing:   Instruction/counseling given: reminded to bring blood glucose meter & log to each visit, discussed foot care and discussed the need for weight loss Educational resources provided: brochure;handout Self management tools provided:   Other plans:

## 2013-07-31 NOTE — Progress Notes (Signed)
Subjective:     Patient ID: [name], female   DOB: 1946/03/31, 67 y.o.   MRN: [id number]  HPI The patient is a 67 YO female who has PMH of type II DM, IBS, venous insufficiency, HTN, GERD and some achilles tendonitis. She states that she has been doing well since last visit. She has some older friends are needing some help and she is helping out. This is keeping her more active although by the end of the day she does have some more swelling in her feet. She is not having any shortness of breath.   Review of Systems  Constitutional: Negative for fever, chills, diaphoresis, activity change, appetite change, fatigue and unexpected weight change.  HENT: Negative.   Eyes: Negative.   Respiratory: Negative for cough, chest tightness, shortness of breath and wheezing.   Cardiovascular: Negative for chest pain, palpitations and leg swelling.  Gastrointestinal: Positive for diarrhea and abdominal distention. Negative for nausea, vomiting, abdominal pain and constipation.       Minimal bloating, stable.  Endocrine: Negative.  Negative for polydipsia, polyphagia and polyuria.  Genitourinary: Negative.   Musculoskeletal: Positive for myalgias. Negative for back pain, joint swelling, arthralgias and gait problem.  Skin: Negative.   Neurological: Negative for dizziness, tremors, seizures, syncope, facial asymmetry, speech difficulty, weakness, light-headedness, numbness and headaches.  Psychiatric/Behavioral: Negative.        Objective:   Physical Exam  Constitutional: She is oriented to person, place, and time. She appears well-developed and well-nourished. No distress.  Obese  HENT:  Head: Normocephalic and atraumatic.  Eyes: EOM are normal. Pupils are equal, round, and reactive to light.  Neck: Normal range of motion. Neck supple. No JVD present. No thyromegaly present.  Cardiovascular: Normal rate and normal heart sounds.   No murmur heard. Pulmonary/Chest: Effort normal and  breath sounds normal. No respiratory distress. She has no wheezes. She has no rales. She exhibits no tenderness.  Abdominal: Soft. Bowel sounds are normal. She exhibits distension. There is no tenderness. There is no rebound and no guarding.  Musculoskeletal: Normal range of motion. She exhibits tenderness. She exhibits no edema.  Minimal tenderness to lateral aspect of the heel on the right foot.   Neurological: She is alert and oriented to person, place, and time. No cranial nerve deficit.  Skin: Skin is warm and dry. No rash noted. She is not diaphoretic. No erythema. No pallor.  Psychiatric: She has a normal mood and affect. Her behavior is normal.       Assessment/Plan:   1. Please see problem oriented charting.   2. Patient will return in about 3 months. She will continue to try to work on diet and exercise. She was advised to work on recording sugars and bringing that back with her to next visit or calling us with results.

## 2013-07-31 NOTE — Assessment & Plan Note (Signed)
Fall Screening 02/09/2013 07/30/2013  Falls in the past year? Yes No  Number of falls in past year 1 -  Was there an injury with Fall? Yes -      Assessment: Progress toward fall prevention goal:   improved Comments:   Plan: Fall prevention plans: avoid uneven ground  Educational resources provided:   Self management tools provided:

## 2013-07-31 NOTE — Assessment & Plan Note (Signed)
BP Readings from Last 3 Encounters:  07/30/13 126/77  04/13/13 138/81  03/02/13 122/78    Lab Results  Component Value Date   NA 134* 02/09/2013   K 4.2 02/09/2013   CREATININE 0.63 02/09/2013    Assessment: Blood pressure control: controlled Progress toward BP goal:  at goal Comments:   Plan: Medications:  continue current medications, lisinopril/hctz 20/25 Educational resources provided: brochure;handout;video Self management tools provided:   Other plans:

## 2013-08-01 NOTE — Progress Notes (Signed)
Case discussed with Dr. Kollar immediately after the visit. We reviewed the resident's history and exam and pertinent patient test results. I agree with the assessment, diagnosis and plan of care documented in the resident's note. 

## 2013-08-10 NOTE — Addendum Note (Signed)
Addended by: Neomia Dear on: 08/10/2013 04:18 PM   Modules accepted: Orders

## 2013-09-21 ENCOUNTER — Other Ambulatory Visit: Payer: Self-pay | Admitting: Internal Medicine

## 2013-09-22 ENCOUNTER — Other Ambulatory Visit: Payer: Self-pay | Admitting: Internal Medicine

## 2013-09-22 DIAGNOSIS — E119 Type 2 diabetes mellitus without complications: Secondary | ICD-10-CM

## 2013-09-22 MED ORDER — METFORMIN HCL 500 MG PO TABS: 500.0000 mg | ORAL_TABLET | Freq: Two times a day (BID) | ORAL | Status: DC

## 2013-09-22 MED ORDER — DULOXETINE HCL 60 MG PO CPEP: 60.0000 mg | ORAL_CAPSULE | Freq: Every day | ORAL | Status: DC

## 2013-10-22 ENCOUNTER — Encounter: Payer: Self-pay | Admitting: Internal Medicine

## 2013-10-22 ENCOUNTER — Ambulatory Visit (INDEPENDENT_AMBULATORY_CARE_PROVIDER_SITE_OTHER): Payer: Medicare Other | Admitting: Internal Medicine

## 2013-10-22 ENCOUNTER — Other Ambulatory Visit: Payer: Self-pay | Admitting: Internal Medicine

## 2013-10-22 VITALS — BP 123/74 | HR 85 | Temp 98.1°F | Ht 65.0 in | Wt 224.5 lb

## 2013-10-22 DIAGNOSIS — E119 Type 2 diabetes mellitus without complications: Secondary | ICD-10-CM

## 2013-10-22 DIAGNOSIS — E785 Hyperlipidemia, unspecified: Secondary | ICD-10-CM

## 2013-10-22 DIAGNOSIS — Z23 Encounter for immunization: Secondary | ICD-10-CM

## 2013-10-22 DIAGNOSIS — E1149 Type 2 diabetes mellitus with other diabetic neurological complication: Secondary | ICD-10-CM

## 2013-10-22 DIAGNOSIS — I10 Essential (primary) hypertension: Secondary | ICD-10-CM

## 2013-10-22 LAB — LIPID PANEL
Cholesterol: 240 mg/dL — ABNORMAL HIGH (ref 0–200)
HDL: 42 mg/dL (ref >39)
LDL Cholesterol: 139 mg/dL — ABNORMAL HIGH (ref 0–99)
Total CHOL/HDL Ratio: 5.7 Ratio
Triglycerides: 295 mg/dL — ABNORMAL HIGH (ref <150)
VLDL: 59 mg/dL — ABNORMAL HIGH (ref 0–40)

## 2013-10-22 LAB — POCT GLYCOSYLATED HEMOGLOBIN (HGB A1C): Hemoglobin A1C: 9.8

## 2013-10-22 LAB — GLUCOSE, CAPILLARY: Glucose-Capillary: 221 mg/dL — ABNORMAL HIGH (ref 70–99)

## 2013-10-22 MED ORDER — METFORMIN HCL 1000 MG PO TABS: 1000.0000 mg | ORAL_TABLET | Freq: Two times a day (BID) | ORAL | Status: DC

## 2013-10-22 NOTE — Patient Instructions (Signed)
We will have you keep track of your sugars for the next week or so and measure in the morning and after dinner if possible. I will call you back in 1 week to hear about your sugars and to give advice on your insulin.   In the meantime we will increase your mealtime insulin from 12 units with meals to 15 units with meals.   Come back in about 3 months to check on your sugars. We will send you to the diabetes educator across the street.   Call us with questions or problems at [phone].  Exercise to Lose Weight Exercise and a healthy diet may help you lose weight. Your doctor may suggest specific exercises. EXERCISE IDEAS AND TIPS  Choose low-cost things you enjoy doing, such as walking, bicycling, or exercising to workout videos.  Take stairs instead of the elevator.  Walk during your lunch break.  Park your car further away from work or school.  Go to a gym or an exercise class.  Start with 5 to 10 minutes of exercise each day. Build up to 30 minutes of exercise 4 to 6 days a week.  Wear shoes with good support and comfortable clothes.  Stretch before and after working out.  Work out until you breathe harder and your heart beats faster.  Drink extra water when you exercise.  Do not do so much that you hurt yourself, feel dizzy, or get very short of breath. Exercises that burn about 150 calories:  Running 1  miles in 15 minutes.  Playing volleyball for 45 to 60 minutes.  Washing and waxing a car for 45 to 60 minutes.  Playing touch football for 45 minutes.  Walking 1  miles in 35 minutes.  Pushing a stroller 1  miles in 30 minutes.  Playing basketball for 30 minutes.  Raking leaves for 30 minutes.  Bicycling 5 miles in 30 minutes.  Walking 2 miles in 30 minutes.  Dancing for 30 minutes.  Shoveling snow for 15 minutes.  Swimming laps for 20 minutes.  Walking up stairs for 15 minutes.  Bicycling 4 miles in 15 minutes.  Gardening for 30 to 45  minutes.  Jumping rope for 15 minutes.  Washing windows or floors for 45 to 60 minutes. Document Released: 12/28/2010 Document Revised: 02/17/2012 Document Reviewed: 12/28/2010 [facility] Patient Information 2014 [place], [place].  Calorie Counting Diet A calorie counting diet requires you to eat the number of calories that are right for you in a day. Calories are the measurement of how much energy you get from the food you eat. Eating the right amount of calories is important for staying at a healthy weight. If you eat too many calories, your body will store them as fat and you may gain weight. If you eat too few calories, you may lose weight. Counting the number of calories you eat during a day will help you know if you are eating the right amount. A Registered Dietitian can determine how many calories you need in a day. The amount of calories needed varies from person to person. If your goal is to lose weight, you will need to eat fewer calories. Losing weight can benefit you if you are overweight or have health problems such as heart disease, high blood pressure, or diabetes. If your goal is to gain weight, you will need to eat more calories. Gaining weight may be necessary if you have a certain health problem that causes your body to need more energy. TIPS  Whether you are increasing or decreasing the number of calories you eat during a day, it may be hard to get used to changes in what you eat and drink. The following are tips to help you keep track of the number of calories you eat.  Measure foods at home with measuring cups. This helps you know the amount of food and number of calories you are eating.  Restaurants often serve food in amounts that are larger than 1 serving. While eating out, estimate how many servings of a food you are given. For example, a serving of cooked rice is  cup or about the size of half of a fist. Knowing serving sizes will help you be aware of how much food you are  eating at restaurants.  Ask for smaller portion sizes or child-size portions at restaurants.  Plan to eat half of a meal at a restaurant. Take the rest home or share the other half with a friend.  Read the Nutrition Facts panel on food labels for calorie content and serving size. You can find out how many servings are in a package, the size of a serving, and the number of calories each serving has.  For example, a package might contain 3 cookies. The Nutrition Facts panel on that package says that 1 serving is 1 cookie. Below that, it will say there are 3 servings in the container. The calories section of the Nutrition Facts label says there are 90 calories. This means there are 90 calories in 1 cookie (1 serving). If you eat 1 cookie you have eaten 90 calories. If you eat all 3 cookies, you have eaten 270 calories (3 servings x 90 calories = 270 calories). The list below tells you how big or small some common portion sizes are.  1 oz.........4 stacked dice.  3 oz........Marland KitchenDeck of cards.  1 tsp.......Marland KitchenTip of little finger.  1 tbs......Marland KitchenMarland KitchenThumb.  2 tbs.......Marland KitchenGolf ball.   cup......Marland KitchenHalf of a fist.  1 cup.......Marland KitchenA fist. KEEP A FOOD LOG Write down every food item you eat, the amount you eat, and the number of calories in each food you eat during the day. At the end of the day, you can add up the total number of calories you have eaten. It may help to keep a list like the one below. Find out the calorie information by reading the Nutrition Facts panel on food labels. Breakfast  Bran cereal (1 cup, 110 calories).  Fat-free milk ( cup, 45 calories). Snack  Apple (1 medium, 80 calories). Lunch  Spinach (1 cup, 20 calories).  Tomato ( medium, 20 calories).  Chicken breast strips (3 oz, 165 calories).  Shredded cheddar cheese ( cup, 110 calories).  Light [place] dressing (2 tbs, 60 calories).  Whole-wheat bread (1 slice, 80 calories).  Tub margarine (1 tsp, 35  calories).  Vegetable soup (1 cup, 160 calories). Dinner  Pork chop (3 oz, 190 calories).  Brown rice (1 cup, 215 calories).  Steamed broccoli ( cup, 20 calories).  Strawberries (1  cup, 65 calories).  Whipped cream (1 tbs, 50 calories). Daily Calorie Total: 1425 Document Released: 11/25/2005 Document Revised: 02/17/2012 Document Reviewed: 05/22/2007 [facility] Patient Information 2014 [place], [place].

## 2013-10-22 NOTE — Assessment & Plan Note (Signed)
BP Readings from Last 3 Encounters:  10/22/13 123/74  07/30/13 126/77  04/13/13 138/81    Lab Results  Component Value Date   NA 134* 02/09/2013   K 4.2 02/09/2013   CREATININE 0.63 02/09/2013    Assessment: Blood pressure control: controlled Progress toward BP goal:  at goal Comments:  Plan: Medications:  continue current medications, lisinopril/HCTZ 20/25 mg daily Educational resources provided: handout Self management tools provided: instructions for home blood pressure monitoring Other plans:

## 2013-10-22 NOTE — Assessment & Plan Note (Signed)
Recheck lipid panel as last LDL was not at goal of <100.

## 2013-10-22 NOTE — Assessment & Plan Note (Signed)
Lab Results  Component Value Date   HGBA1C 9.8 10/22/2013   HGBA1C 8.4 07/30/2013   HGBA1C 8.6 02/09/2013     Assessment: Diabetes control: poor control (HgbA1C >9%) Progress toward A1C goal:  deteriorated Comments: gaining weight and eating more  Plan: Medications:  increase metformin to 1000 mg bid, increase novolog to 15 units with meals, lantus 70 units at night time Home glucose monitoring: Frequency:  (she tests once a week) Timing:  (on Mondays) Instruction/counseling given: discussed the need for weight loss Educational resources provided: handout Self management tools provided: instructions for home glucose monitoring Other plans: She will check sugars and in 1 week MD to call and go over values. She was referred to diabetic educator for dietary teaching which I think she would benefit from. Foot exam, microalbumin to creatinine ratio, HgA1c, lipid panel checked at today's visit.

## 2013-10-22 NOTE — Progress Notes (Signed)
Subjective:     Patient ID: [name], female   DOB: 1946-02-06, 67 y.o.   MRN: [id number]  HPI The patient is a 67 YO female who has PMH of type II DM, IBS, venous insufficiency, HTN, GERD and some achilles tendonitis. She states that she has been doing well since last visit. She has some older friends are needing some help and she is helping out. She did not bring her meter in today for review. She states that she is not having hypoglycemic episodes. She does only check her sugars on Mondays in the morning. She has not noticed high sugars. She states that she has been walking to get her husband to chemotherapy. She has been eating a lot and snacking frequently. She denies chest pain, SOB, nausea, vomiting, abdominal pain.    Review of Systems  Constitutional: Negative for fever, chills, diaphoresis, activity change, appetite change, fatigue and unexpected weight change.  HENT: Negative.   Eyes: Negative.   Respiratory: Negative for cough, chest tightness, shortness of breath and wheezing.   Cardiovascular: Negative for chest pain, palpitations and leg swelling.  Gastrointestinal: Positive for diarrhea and abdominal distention. Negative for nausea, vomiting, abdominal pain and constipation.       Minimal bloating, stable.  Endocrine: Negative.  Negative for polydipsia, polyphagia and polyuria.  Genitourinary: Negative.   Musculoskeletal: Positive for myalgias. Negative for arthralgias, back pain, gait problem and joint swelling.  Skin: Negative.   Neurological: Negative for dizziness, tremors, seizures, syncope, facial asymmetry, speech difficulty, weakness, light-headedness, numbness and headaches.  Psychiatric/Behavioral: Negative.        Objective:   Physical Exam  Constitutional: She is oriented to person, place, and time. She appears well-developed and well-nourished. No distress.  Obese  HENT:  Head: Normocephalic and atraumatic.  Eyes: EOM are normal. Pupils are equal,  round, and reactive to light.  Neck: Normal range of motion. Neck supple. No JVD present. No thyromegaly present.  Cardiovascular: Normal rate and normal heart sounds.   No murmur heard. Pulmonary/Chest: Effort normal and breath sounds normal. No respiratory distress. She has no wheezes. She has no rales. She exhibits no tenderness.  Abdominal: Soft. Bowel sounds are normal. She exhibits distension. There is no tenderness. There is no rebound and no guarding.  Musculoskeletal: Normal range of motion. She exhibits tenderness. She exhibits no edema.  Minimal tenderness to lateral aspect of the heel on the right foot.   Neurological: She is alert and oriented to person, place, and time. No cranial nerve deficit.  Skin: Skin is warm and dry. No rash noted. She is not diaphoretic. No erythema. No pallor.  Psychiatric: She has a normal mood and affect. Her behavior is normal.       Assessment/Plan:   1. Please see problem oriented charting.   2. Patient will return in about 3 months. She will increase metformin to 1000 mg BID. She will continue to try to work on diet and exercise. She will check sugars and I will call her in 1 week to get those values. Increase novolog to 15 units with meals. Foot exam, microalbumin to creatinine ratio, lipid panel, flu shot today. She will see diabetic educator across the street.  She will return in about 3 months.

## 2013-10-23 ENCOUNTER — Other Ambulatory Visit: Payer: Self-pay | Admitting: Internal Medicine

## 2013-10-23 LAB — MICROALBUMIN / CREATININE URINE RATIO
Creatinine, Urine: 62.8 mg/dL
Microalb Creat Ratio: 21.5 mg/g (ref 0.0–30.0)
Microalb, Ur: 1.35 mg/dL (ref 0.00–1.89)

## 2013-10-25 NOTE — Telephone Encounter (Signed)
This Rx has been increased to 1000 mg BID and sent at last visit. This rx will be denied as it is no longer her medication. Please let pharmacy know.  Thanks,  Dr. Dorise Hiss

## 2013-10-25 NOTE — Progress Notes (Signed)
Case discussed with Dr. Kollar at the time of the visit.  We reviewed the resident's history and exam and pertinent patient test results.  I agree with the assessment, diagnosis, and plan of care documented in the resident's note.     

## 2013-10-26 ENCOUNTER — Other Ambulatory Visit: Payer: Self-pay | Admitting: Internal Medicine

## 2013-10-29 ENCOUNTER — Telehealth: Payer: Self-pay | Admitting: Internal Medicine

## 2013-10-29 NOTE — Telephone Encounter (Signed)
Called the patient to check on her sugars over the last week. She was unable to start collecting sugars over the weekend. She was able to start on Tuesday 7:15 am 153 (not eaten yet), 98 @ 10:00 (after breakfast), 3:15 PM 89 (no lunch yet), 6 PM 81 (before dinner), 159 Wednesday morning. 120 12:35 (after lunch), 163 Thursday morning 8:20 AM (ice cream the night before). She does seem to be doing better about thinking about what to eat and this is a difficult time for her running to and fro her husband's chemotherapy and radiation appointments at [facility]. She is likely getting more than usual exercise which is helping the daytime sugars to be appropriate. She was able to make the change to 15 units with meals novolog. I reinforced over the phone that she is not to take her novolog without eating as she does have 80-90s and would likely have hypoglycemia if she took it without eating. She understands and will continue to work on diet and exercise. In future may be able to titrate up her lantus slightly as most of her morning sugars as 150s.   [name] 10:42 AM 10/29/2013

## 2013-11-25 NOTE — Addendum Note (Signed)
Addended by: Neomia Dear on: 11/25/2013 06:12 PM   Modules accepted: Orders

## 2013-12-23 ENCOUNTER — Other Ambulatory Visit: Payer: Self-pay | Admitting: Internal Medicine

## 2014-02-11 ENCOUNTER — Other Ambulatory Visit: Payer: Self-pay | Admitting: Internal Medicine

## 2014-03-01 ENCOUNTER — Ambulatory Visit (INDEPENDENT_AMBULATORY_CARE_PROVIDER_SITE_OTHER): Payer: Medicare Other | Admitting: Internal Medicine

## 2014-03-01 ENCOUNTER — Encounter: Payer: Self-pay | Admitting: Internal Medicine

## 2014-03-01 VITALS — BP 114/69 | HR 83 | Temp 98.0°F | Ht 65.0 in | Wt 221.9 lb

## 2014-03-01 DIAGNOSIS — E785 Hyperlipidemia, unspecified: Secondary | ICD-10-CM

## 2014-03-01 DIAGNOSIS — IMO0002 Reserved for concepts with insufficient information to code with codable children: Secondary | ICD-10-CM

## 2014-03-01 DIAGNOSIS — I10 Essential (primary) hypertension: Secondary | ICD-10-CM

## 2014-03-01 DIAGNOSIS — E1149 Type 2 diabetes mellitus with other diabetic neurological complication: Secondary | ICD-10-CM

## 2014-03-01 DIAGNOSIS — E1165 Type 2 diabetes mellitus with hyperglycemia: Secondary | ICD-10-CM

## 2014-03-01 DIAGNOSIS — K219 Gastro-esophageal reflux disease without esophagitis: Secondary | ICD-10-CM

## 2014-03-01 LAB — GLUCOSE, CAPILLARY: Glucose-Capillary: 104 mg/dL — ABNORMAL HIGH (ref 70–99)

## 2014-03-01 LAB — POCT GLYCOSYLATED HEMOGLOBIN (HGB A1C): Hemoglobin A1C: 9.3

## 2014-03-01 MED ORDER — INSULIN ASPART 100 UNIT/ML ~~LOC~~ SOLN: SUBCUTANEOUS | Status: DC

## 2014-03-01 NOTE — Patient Instructions (Addendum)
Please keep taking 15 units with breakfast and take 15 units with lunch. Start taking 20 units with your night time meal (or the largest meal of the day if not night time meal).   Keep taking 70 units of lantus at night time.  Work on decreasing the carbohydrates (pastas, breads, sugary beverages or foods) which will help your sugars. Also work on increasing your exercise which will help your body use the sugar better.   Come back in about 3 months and call us with questions or problems before then at [phone].  When you have changed the name on your card please call us and we will send you to the dermatologist for your area on your hand.   Please bring your medicines with you each time you come.   Medicines may be  Eye drops  Herbal   Vitamins  Pills  Seeing these help us take care of you.  Basic Carbohydrate Counting Basic carbohydrate counting is a way to plan meals. It is done by counting the amount of carbohydrate in foods. Foods that have carbohydrates are starches (grains, beans, starchy vegetables) and sweets. Eating carbohydrates increases blood glucose (sugar) levels. People with diabetes use carbohydrate counting to help keep their blood glucose at a normal level.  COUNTING CARBOHYDRATES IN FOODS The first step in counting carbohydrates is to learn how many carbohydrate servings you should have in every meal. A dietitian can plan this for you. After learning the amount of carbohydrates to include in your meal plan, you can start to choose the carbohydrate-containing foods you want to eat.  There are 2 ways to identify the amount of carbohydrates in the foods you eat.  Read the Nutrition Facts panel on food labels. You need 2 pieces of information from the Nutrition Facts panel to count carbohydrates this way:  Serving size.  Total carbohydrate (in grams). Decide how many servings you will be eating. If it is 1 serving, you will be eating the amount of carbohydrate  listed on the panel. If you will be eating 2 servings, you will be eating double the amount of carbohydrate listed on the panel.   Learn serving sizes. A serving size of most carbohydrate-containing foods is about 15 grams (g). Listed below are single serving sizes of common carbohydrate-containing foods:  1 slice bread.   cup unsweetened, dry cereal.   cup hot cereal.   cup rice.   cup mashed potatoes.   cup pasta.  1 cup fresh fruit.   cup canned fruit.  1 cup milk (whole, 2%, or skim).   cup starchy vegetables (peas, corn, or potatoes). Counting carbohydrates this way is similar to looking on the Nutrition Facts panel. Decide how many servings you will eat first. Multiply the number of servings you eat by 15 g. For example, if you have 2 cups of strawberries, you had 2 servings. That means you had 30 g of carbohydrate (2 servings x 15 g = 30 g). CALCULATING CARBOHYDRATES IN A MEAL Sample dinner  3 oz chicken breast.   cup brown rice.   cup corn.  1 cup fat-free milk.  1 cup strawberries with sugar-free whipped topping. Carbohydrate calculation First, identify the foods that contain carbohydrate:  Rice.  Corn.  Milk.  Strawberries. Calculate the number of servings eaten:  2 servings rice.  1 serving corn.  1 serving milk.  1 serving strawberries. Multiply the number of servings by 15 g:  2 servings rice x 15 g = 30  g.  1 serving corn x 15 g = 15 g.  1 serving milk x 15 g = 15 g.  1 serving strawberries x 15 g = 15 g. Add the amounts to find the total carbohydrates eaten: 30 g + 15 g + 15 g + 15 g = 75 g carbohydrate eaten at dinner. Document Released: 11/25/2005 Document Revised: 02/17/2012 Document Reviewed: 10/11/2011 [facility] Patient Information 2014 [place], [place].

## 2014-03-01 NOTE — Progress Notes (Signed)
Subjective:     Patient ID: [name], female   DOB: 15-Jun-1946, 68 y.o.   MRN: [id number]  HPI The patient is a 68 YO female who has PMH of type II DM, IBS, venous insufficiency, HTN, GERD. She has been going through a rough time with her home life and her husband is now undergoing more chemotherapy. She is not coping well and does not have much if any time for herself.  She has been having some higher sugars but mostly the morning sugars are in the 110-130. Her evening sugars are the ones that are most elevated. She did not bring her meter in today for review. She states that she is not having hypoglycemic episodes. She has been still snacking frequently. She denies chest pain, SOB, nausea, vomiting, abdominal pain.    Review of Systems  Constitutional: Negative for fever, chills, diaphoresis, activity change, appetite change, fatigue and unexpected weight change.  HENT: Negative.   Eyes: Negative.   Respiratory: Negative for cough, chest tightness, shortness of breath and wheezing.   Cardiovascular: Negative for chest pain, palpitations and leg swelling.  Gastrointestinal: Negative for nausea, vomiting, abdominal pain and constipation.  Endocrine: Negative.  Negative for polydipsia, polyphagia and polyuria.  Genitourinary: Negative.   Musculoskeletal: Positive for myalgias. Negative for arthralgias, back pain, gait problem and joint swelling.  Skin: Negative.   Neurological: Negative for dizziness, tremors, seizures, syncope, facial asymmetry, speech difficulty, weakness, light-headedness, numbness and headaches.  Psychiatric/Behavioral: Negative.        Objective:   Physical Exam  Constitutional: She is oriented to person, place, and time. She appears well-developed and well-nourished. No distress.  Obese  HENT:  Head: Normocephalic and atraumatic.  Eyes: EOM are normal. Pupils are equal, round, and reactive to light.  Neck: Normal range of motion. Neck supple. No JVD  present. No thyromegaly present.  Cardiovascular: Normal rate and normal heart sounds.   No murmur heard. Pulmonary/Chest: Effort normal and breath sounds normal. No respiratory distress. She has no wheezes. She has no rales. She exhibits no tenderness.  Abdominal: Soft. Bowel sounds are normal. There is no tenderness. There is no rebound and no guarding.  Musculoskeletal: Normal range of motion. She exhibits tenderness. She exhibits no edema.  Some tenderness in her legs and feet.  Neurological: She is alert and oriented to person, place, and time. No cranial nerve deficit.  Skin: Skin is warm and dry. No rash noted. She is not diaphoretic. No erythema. No pallor.  Psychiatric: She has a normal mood and affect. Her behavior is normal.       Assessment/Plan:   1. Please see problem oriented charting.   2. Patient will return in about 3 months. She will increase novolog to 20 units with largest meal. She will work on limiting carbs and exercising.

## 2014-03-02 NOTE — Assessment & Plan Note (Signed)
Patient's HgA1c is mildly improved today at 9.3 however still needs improvement. Talked about dietary changes and exercise. She will increase novolog to 15 units with 2 smaller meals and 20 units with largest meal of day. She will still take lantus 70 units at bedtime. She is still taking metformin although takes it 500 mg TID.

## 2014-03-02 NOTE — Assessment & Plan Note (Signed)
Her LDL is still above goal and she is at max dose pravastatin. Will repeat lipid panel and consider change to more potent statin if affordable in 6 months.

## 2014-03-02 NOTE — Assessment & Plan Note (Signed)
BP Readings from Last 3 Encounters:  03/01/14 114/69  10/22/13 123/74  07/30/13 126/77    Lab Results  Component Value Date   NA 134* 02/09/2013   K 4.2 02/09/2013   CREATININE 0.63 02/09/2013    Assessment: Blood pressure control: controlled Progress toward BP goal:  at goal Comments:   Plan: Medications:  continue current medications, lisinopril/hctz 20/25 mg Educational resources provided: brochure;handout;video Self management tools provided: instructions for home blood pressure monitoring Other plans:

## 2014-03-02 NOTE — Assessment & Plan Note (Signed)
She continues to use nexium and avoids foods such as peanut butter which aggravate her stomach.

## 2014-03-03 NOTE — Progress Notes (Signed)
Case discussed with Dr. Kollar soon after the resident saw the patient.  We reviewed the resident's history and exam and pertinent patient test results.  I agree with the assessment, diagnosis and plan of care documented in the resident's note. 

## 2014-03-15 ENCOUNTER — Other Ambulatory Visit: Payer: Self-pay | Admitting: Internal Medicine

## 2014-03-26 ENCOUNTER — Inpatient Hospital Stay (HOSPITAL_COMMUNITY)
Admission: EM | Admit: 2014-03-26 | Discharge: 2014-03-31 | DRG: 378 | Disposition: A | Discharge status: Home / Self Care | Payer: Medicare Other | Attending: Internal Medicine | Admitting: Internal Medicine

## 2014-03-26 ENCOUNTER — Encounter (HOSPITAL_COMMUNITY): Payer: Self-pay | Admitting: Emergency Medicine

## 2014-03-26 DIAGNOSIS — E785 Hyperlipidemia, unspecified: Secondary | ICD-10-CM | POA: Diagnosis present

## 2014-03-26 DIAGNOSIS — Z885 Allergy status to narcotic agent status: Secondary | ICD-10-CM

## 2014-03-26 DIAGNOSIS — K589 Irritable bowel syndrome without diarrhea: Secondary | ICD-10-CM | POA: Diagnosis present

## 2014-03-26 DIAGNOSIS — E1149 Type 2 diabetes mellitus with other diabetic neurological complication: Secondary | ICD-10-CM | POA: Diagnosis present

## 2014-03-26 DIAGNOSIS — K5730 Diverticulosis of large intestine without perforation or abscess without bleeding: Secondary | ICD-10-CM | POA: Diagnosis present

## 2014-03-26 DIAGNOSIS — Z8041 Family history of malignant neoplasm of ovary: Secondary | ICD-10-CM

## 2014-03-26 DIAGNOSIS — E1165 Type 2 diabetes mellitus with hyperglycemia: Secondary | ICD-10-CM | POA: Diagnosis present

## 2014-03-26 DIAGNOSIS — Z888 Allergy status to other drugs, medicaments and biological substances status: Secondary | ICD-10-CM

## 2014-03-26 DIAGNOSIS — Z6838 Body mass index (BMI) 38.0-38.9, adult: Secondary | ICD-10-CM

## 2014-03-26 DIAGNOSIS — E1142 Type 2 diabetes mellitus with diabetic polyneuropathy: Secondary | ICD-10-CM | POA: Diagnosis present

## 2014-03-26 DIAGNOSIS — I10 Essential (primary) hypertension: Secondary | ICD-10-CM | POA: Diagnosis present

## 2014-03-26 DIAGNOSIS — E119 Type 2 diabetes mellitus without complications: Secondary | ICD-10-CM | POA: Diagnosis present

## 2014-03-26 DIAGNOSIS — Z794 Long term (current) use of insulin: Secondary | ICD-10-CM

## 2014-03-26 DIAGNOSIS — E669 Obesity, unspecified: Secondary | ICD-10-CM | POA: Diagnosis present

## 2014-03-26 DIAGNOSIS — E118 Type 2 diabetes mellitus with unspecified complications: Secondary | ICD-10-CM | POA: Diagnosis present

## 2014-03-26 DIAGNOSIS — D62 Acute posthemorrhagic anemia: Secondary | ICD-10-CM | POA: Diagnosis present

## 2014-03-26 DIAGNOSIS — Z8249 Family history of ischemic heart disease and other diseases of the circulatory system: Secondary | ICD-10-CM

## 2014-03-26 DIAGNOSIS — Z882 Allergy status to sulfonamides status: Secondary | ICD-10-CM

## 2014-03-26 DIAGNOSIS — Z823 Family history of stroke: Secondary | ICD-10-CM

## 2014-03-26 DIAGNOSIS — E8779 Other fluid overload: Secondary | ICD-10-CM | POA: Diagnosis present

## 2014-03-26 DIAGNOSIS — K922 Gastrointestinal hemorrhage, unspecified: Secondary | ICD-10-CM | POA: Diagnosis present

## 2014-03-26 DIAGNOSIS — E871 Hypo-osmolality and hyponatremia: Secondary | ICD-10-CM | POA: Diagnosis present

## 2014-03-26 DIAGNOSIS — I872 Venous insufficiency (chronic) (peripheral): Secondary | ICD-10-CM | POA: Diagnosis present

## 2014-03-26 DIAGNOSIS — K219 Gastro-esophageal reflux disease without esophagitis: Secondary | ICD-10-CM | POA: Diagnosis present

## 2014-03-26 DIAGNOSIS — G609 Hereditary and idiopathic neuropathy, unspecified: Secondary | ICD-10-CM | POA: Diagnosis present

## 2014-03-26 DIAGNOSIS — Z833 Family history of diabetes mellitus: Secondary | ICD-10-CM

## 2014-03-26 DIAGNOSIS — D72829 Elevated white blood cell count, unspecified: Secondary | ICD-10-CM | POA: Diagnosis present

## 2014-03-26 DIAGNOSIS — K5731 Diverticulosis of large intestine without perforation or abscess with bleeding: Principal | ICD-10-CM | POA: Diagnosis present

## 2014-03-26 DIAGNOSIS — R000 Tachycardia, unspecified: Secondary | ICD-10-CM | POA: Diagnosis present

## 2014-03-26 HISTORY — DX: Gastrointestinal hemorrhage, unspecified: K92.2

## 2014-03-26 LAB — CBC
HCT: 30.3 % — ABNORMAL LOW (ref 36.0–46.0)
HCT: 30.8 % — ABNORMAL LOW (ref 36.0–46.0)
Hemoglobin: 10 g/dL — ABNORMAL LOW (ref 12.0–15.0)
Hemoglobin: 9.9 g/dL — ABNORMAL LOW (ref 12.0–15.0)
MCH: 27 pg (ref 26.0–34.0)
MCH: 26.4 pg (ref 26.0–34.0)
MCHC: 33 g/dL (ref 30.0–36.0)
MCHC: 32.1 g/dL (ref 30.0–36.0)
MCV: 81.9 fL (ref 78.0–100.0)
MCV: 82.1 fL (ref 78.0–100.0)
Platelets: 225 10*3/uL (ref 150–400)
Platelets: 256 10*3/uL (ref 150–400)
RBC: 3.7 MIL/uL — ABNORMAL LOW (ref 3.87–5.11)
RBC: 3.75 MIL/uL — ABNORMAL LOW (ref 3.87–5.11)
RDW: 14.1 % (ref 11.5–15.5)
RDW: 14.2 % (ref 11.5–15.5)
WBC: 9.2 10*3/uL (ref 4.0–10.5)
WBC: 9.5 10*3/uL (ref 4.0–10.5)

## 2014-03-26 LAB — COMPREHENSIVE METABOLIC PANEL
ALT: 16 U/L (ref 0–35)
AST: 13 U/L (ref 0–37)
Albumin: 3 g/dL — ABNORMAL LOW (ref 3.5–5.2)
Alkaline Phosphatase: 84 U/L (ref 39–117)
BUN: 16 mg/dL (ref 6–23)
CO2: 25 mEq/L (ref 19–32)
Calcium: 8.4 mg/dL (ref 8.4–10.5)
Chloride: 104 mEq/L (ref 96–112)
Creatinine, Ser: 0.65 mg/dL (ref 0.50–1.10)
GFR calc Af Amer: >90 mL/min (ref >90)
GFR calc non Af Amer: 90 mL/min — ABNORMAL LOW (ref >90)
Glucose, Bld: 308 mg/dL — ABNORMAL HIGH (ref 70–99)
Potassium: 4.3 mEq/L (ref 3.7–5.3)
Sodium: 141 mEq/L (ref 137–147)
Total Bilirubin: 0.3 mg/dL (ref 0.3–1.2)
Total Protein: 6.2 g/dL (ref 6.0–8.3)

## 2014-03-26 LAB — GLUCOSE, CAPILLARY
Glucose-Capillary: 229 mg/dL — ABNORMAL HIGH (ref 70–99)
Glucose-Capillary: 157 mg/dL — ABNORMAL HIGH (ref 70–99)
Glucose-Capillary: 152 mg/dL — ABNORMAL HIGH (ref 70–99)

## 2014-03-26 LAB — CBC WITH DIFFERENTIAL/PLATELET
Basophils Absolute: 0.1 10*3/uL (ref 0.0–0.1)
Basophils Relative: 1 % (ref 0–1)
Eosinophils Absolute: 0.2 10*3/uL (ref 0.0–0.7)
Eosinophils Relative: 2 % (ref 0–5)
HCT: 32.2 % — ABNORMAL LOW (ref 36.0–46.0)
Hemoglobin: 10.6 g/dL — ABNORMAL LOW (ref 12.0–15.0)
Lymphocytes Relative: 20 % (ref 12–46)
Lymphs Abs: 1.8 10*3/uL (ref 0.7–4.0)
MCH: 27.2 pg (ref 26.0–34.0)
MCHC: 32.9 g/dL (ref 30.0–36.0)
MCV: 82.6 fL (ref 78.0–100.0)
Monocytes Absolute: 0.7 10*3/uL (ref 0.1–1.0)
Monocytes Relative: 7 % (ref 3–12)
Neutro Abs: 6.6 10*3/uL (ref 1.7–7.7)
Neutrophils Relative %: 70 % (ref 43–77)
Platelets: 240 10*3/uL (ref 150–400)
RBC: 3.9 MIL/uL (ref 3.87–5.11)
RDW: 14 % (ref 11.5–15.5)
WBC: 9.3 10*3/uL (ref 4.0–10.5)

## 2014-03-26 LAB — MRSA PCR SCREENING: MRSA by PCR: NEGATIVE

## 2014-03-26 LAB — CBG MONITORING, ED: Glucose-Capillary: 279 mg/dL — ABNORMAL HIGH (ref 70–99)

## 2014-03-26 LAB — ABO/RH: ABO/RH(D): A POS

## 2014-03-26 LAB — TROPONIN I: Troponin I: <0.3 ng/mL (ref <0.30)

## 2014-03-26 MED ORDER — SODIUM CHLORIDE 0.9 % IJ SOLN
3.0000 mL | Freq: Two times a day (BID) | INTRAMUSCULAR | Status: DC
  Administered 2014-03-26 – 2014-03-30 (×8): 3 mL via INTRAVENOUS

## 2014-03-26 MED ORDER — INSULIN GLARGINE 100 UNIT/ML ~~LOC~~ SOLN
10.0000 [IU] | SUBCUTANEOUS | Status: DC
  Administered 2014-03-26 – 2014-03-29 (×4): 10 [IU] via SUBCUTANEOUS
  Filled 2014-03-26 (×5): qty 0.1

## 2014-03-26 MED ORDER — INSULIN ASPART 100 UNIT/ML ~~LOC~~ SOLN
0.0000 [IU] | SUBCUTANEOUS | Status: DC
  Administered 2014-03-26: 3 [IU] via SUBCUTANEOUS
  Administered 2014-03-26: 5 [IU] via SUBCUTANEOUS
  Administered 2014-03-26 – 2014-03-27 (×4): 3 [IU] via SUBCUTANEOUS
  Administered 2014-03-27: 5 [IU] via SUBCUTANEOUS
  Administered 2014-03-27: 15 [IU] via SUBCUTANEOUS
  Administered 2014-03-27: 5 [IU] via SUBCUTANEOUS
  Administered 2014-03-28: 3 [IU] via SUBCUTANEOUS
  Administered 2014-03-28 (×2): 5 [IU] via SUBCUTANEOUS
  Administered 2014-03-28: 8 [IU] via SUBCUTANEOUS
  Administered 2014-03-28 (×2): 2 [IU] via SUBCUTANEOUS
  Administered 2014-03-29: 3 [IU] via SUBCUTANEOUS

## 2014-03-26 MED ORDER — INSULIN ASPART 100 UNIT/ML ~~LOC~~ SOLN
5.0000 [IU] | Freq: Once | SUBCUTANEOUS | Status: AC
  Administered 2014-03-26: 5 [IU] via SUBCUTANEOUS
  Filled 2014-03-26: qty 1

## 2014-03-26 MED ORDER — DULOXETINE HCL 60 MG PO CPEP
60.0000 mg | ORAL_CAPSULE | Freq: Every day | ORAL | Status: DC
  Administered 2014-03-26 – 2014-03-31 (×5): 60 mg via ORAL
  Filled 2014-03-26 (×6): qty 1

## 2014-03-26 MED ORDER — ONDANSETRON HCL 4 MG/2ML IJ SOLN
4.0000 mg | Freq: Four times a day (QID) | INTRAMUSCULAR | Status: DC | PRN
  Administered 2014-03-27: 4 mg via INTRAVENOUS
  Filled 2014-03-26: qty 2

## 2014-03-26 MED ORDER — SODIUM CHLORIDE 0.9 % IV BOLUS (SEPSIS)
1000.0000 mL | Freq: Once | INTRAVENOUS | Status: AC
  Administered 2014-03-26: 1000 mL via INTRAVENOUS

## 2014-03-26 MED ORDER — PANTOPRAZOLE SODIUM 40 MG PO TBEC
40.0000 mg | DELAYED_RELEASE_TABLET | Freq: Every day | ORAL | Status: DC
  Administered 2014-03-26: 40 mg via ORAL
  Filled 2014-03-26: qty 1

## 2014-03-26 MED ORDER — ONDANSETRON HCL 4 MG PO TABS: 4.0000 mg | ORAL_TABLET | Freq: Four times a day (QID) | ORAL | Status: DC | PRN

## 2014-03-26 MED ORDER — SODIUM CHLORIDE 0.9 % IV SOLN
INTRAVENOUS | Status: DC
  Administered 2014-03-26: 12:00:00 via INTRAVENOUS
  Administered 2014-03-26 – 2014-03-27 (×3): 1000 mL via INTRAVENOUS
  Administered 2014-03-27 – 2014-03-28 (×2): via INTRAVENOUS

## 2014-03-26 NOTE — ED Notes (Signed)
Reports rectal bleeding x 26 hrs.  Started as some blood in stool, lately all blood clots. Also lower abdominal pain.

## 2014-03-26 NOTE — ED Provider Notes (Signed)
CSN: [id number]     Arrival date & time 03/26/14  [id number] History   First MD Initiated Contact with Patient 03/26/14 [phone]     Chief Complaint  Patient presents with  . GI Bleeding     (Consider location/radiation/quality/duration/timing/severity/associated sxs/prior Treatment) HPI Complaint of rectal bleeding onset 4 AM yesterday morning. Nothing makes symptoms better or worse. She also presents with low abdominal pain which started at 6:50 AM today pain is mild, nonradiating. Crampy. Also complains of generalized weakness. Worse with standing. No treatment prior to coming here brought by EMS. No fever no urinary symptoms no other associated symptoms. Past Medical History  Diagnosis Date  . GERD (gastroesophageal reflux disease)   . Hyperlipidemia   . Hypertension   . Diabetes mellitus     type II  . Depression   . Peripheral neuropathy   . Venous insufficiency   . Achilles tendinitis   . Right shoulder pain     Subacromial tendinitis  . Calcaneal spur     right  . Ventral hernia   . Cataract    Past Surgical History  Procedure Laterality Date  . Cholecystectomy  2001  . Abdominal hysterectomy  2001    TAH-BSO  . Incisional hernia repair    . Tonsillectomy     Family History  Problem Relation Age of Onset  . Heart failure Father     Died in 5480s  . Colon cancer Neg Hx   . Ovarian cancer Sister   . Diabetes Sister   . Diabetes Paternal Grandmother   . Heart failure Mother     Died in 4180s  . CVA Father    History  Substance Use Topics  . Smoking status: Never Smoker   . Smokeless tobacco: Never Used  . Alcohol Use: No   OB History   Grav Para Term Preterm Abortions TAB SAB Ect Mult Living                 Review of Systems  HENT: Negative.   Respiratory: Negative.   Cardiovascular: Negative.   Gastrointestinal: Positive for abdominal pain and blood in stool.  Musculoskeletal: Negative.   Skin: Negative.   Neurological: Positive for weakness.   Generalized weakness  Psychiatric/Behavioral: Negative.   All other systems reviewed and are negative.     Allergies  Codeine sulfate; Meperidine hcl; Morphine; and Propoxyphene hcl  Home Medications   Prior to Admission medications   Medication Sig Start Date End Date Taking? Authorizing Provider  DULoxetine (CYMBALTA) 60 MG capsule Take 1 capsule (60 mg total) by mouth daily. 09/22/13   Judie BonusElizabeth A Kollar, MD  gabapentin (NEURONTIN) 300 MG capsule TAKE 1 CAPSULE BY MOUTH (3) THREE TIMES DAILY. 12/23/13   Judie BonusElizabeth A Kollar, MD  insulin aspart (NOVOLOG) 100 UNIT/ML injection Take 15 units with breakfast and lunch, 20 units with night time meal. 03/01/14   Judie BonusElizabeth A Kollar, MD  LANTUS 100 UNIT/ML injection INJECT 70 UNITS INTO THE SKIN DAILY AT BEDTIME. 02/11/14   Judie BonusElizabeth A Kollar, MD  lisinopril-hydrochlorothiazide (PRINZIDE,ZESTORETIC) 20-25 MG per tablet Take 1 tablet by mouth daily. 04/06/13   Rocco SereneLawrence D Klima, MD  metFORMIN (GLUCOPHAGE) 1000 MG tablet Take 1 tablet (1,000 mg total) by mouth 2 (two) times daily with a meal. 10/22/13   Judie BonusElizabeth A Kollar, MD  NEXIUM 40 MG capsule TAKE 1 CAPSULE (40 MG TOTAL) BY MOUTH DAILY BEFORE BREAKFAST. 03/23/13   Judie BonusElizabeth A Kollar, MD  pravastatin (PRAVACHOL) 80 MG tablet Take 1  tablet (80 mg total) by mouth daily. 02/09/13   [name], MD  pravastatin (PRAVACHOL) 80 MG tablet TAKE 1 TABLET (80 MG TOTAL) BY MOUTH EVERY EVENING. 03/15/14   [name], MD   BP 152/56  Pulse 92  Temp(Src) 98.1 F (36.7 C) (Oral)  Resp 20  Ht 5\' 5"  (1.651 m)  Wt 220 lb (99.791 kg)  BMI 36.61 kg/m2  SpO2 97% Physical Exam  Nursing note and vitals reviewed. Constitutional: She appears well-developed and well-nourished.  HENT:  Head: Normocephalic and atraumatic.  Eyes: Conjunctivae are normal. Pupils are equal, round, and reactive to light.  Neck: Neck supple. No tracheal deviation present. No thyromegaly present.  Cardiovascular: Normal rate and  regular rhythm.   No murmur heard. Pulmonary/Chest: Effort normal and breath sounds normal.  Abdominal: Soft. Bowel sounds are normal. She exhibits no distension. There is no tenderness.  Genitourinary:  Rectum normal tone gross blood present  Musculoskeletal: Normal range of motion. She exhibits no edema and no tenderness.  Neurological: She is alert. Coordination normal.  Skin: Skin is warm and dry. No rash noted.  Psychiatric: She has a normal mood and affect.    ED Course  Procedures (including critical care time) Labs Review Labs Reviewed - No data to display  Imaging Review No results found. 8:30 AM patient resting comfortably. No distress. Declines pain medicine.  EKG Interpretation None     Results for orders placed during the hospital encounter of 03/26/14  CBC WITH DIFFERENTIAL      Result Value Ref Range   WBC 9.3  4.0 - 10.5 K/uL   RBC 3.90  3.87 - 5.11 MIL/uL   Hemoglobin 10.6 (*) 12.0 - 15.0 g/dL   HCT 16.1 (*) 09.6 - 04.5 %   MCV 82.6  78.0 - 100.0 fL   MCH 27.2  26.0 - 34.0 pg   MCHC 32.9  30.0 - 36.0 g/dL   RDW 40.9  81.1 - 91.4 %   Platelets 240  150 - 400 K/uL   Neutrophils Relative % 70  43 - 77 %   Neutro Abs 6.6  1.7 - 7.7 K/uL   Lymphocytes Relative 20  12 - 46 %   Lymphs Abs 1.8  0.7 - 4.0 K/uL   Monocytes Relative 7  3 - 12 %   Monocytes Absolute 0.7  0.1 - 1.0 K/uL   Eosinophils Relative 2  0 - 5 %   Eosinophils Absolute 0.2  0.0 - 0.7 K/uL   Basophils Relative 1  0 - 1 %   Basophils Absolute 0.1  0.0 - 0.1 K/uL  COMPREHENSIVE METABOLIC PANEL      Result Value Ref Range   Sodium 141  137 - 147 mEq/L   Potassium 4.3  3.7 - 5.3 mEq/L   Chloride 104  96 - 112 mEq/L   CO2 25  19 - 32 mEq/L   Glucose, Bld 308 (*) 70 - 99 mg/dL   BUN 16  6 - 23 mg/dL   Creatinine, Ser 7.82  0.50 - 1.10 mg/dL   Calcium 8.4  8.4 - 95.6 mg/dL   Total Protein 6.2  6.0 - 8.3 g/dL   Albumin 3.0 (*) 3.5 - 5.2 g/dL   AST 13  0 - 37 U/L   ALT 16  0 - 35 U/L    Alkaline Phosphatase 84  39 - 117 U/L   Total Bilirubin 0.3  0.3 - 1.2 mg/dL   GFR calc non  Af Amer 90 (*) >90 mL/min   GFR calc Af Amer >90  >90 mL/min  TYPE AND SCREEN      Result Value Ref Range   ABO/RH(D) A POS     Antibody Screen NEG     Sample Expiration 03/29/2014     No results found.  MDM   Final diagnoses:  None   Spoke with internal medicine service resident physician who will arrange for inpatient stay. Plan type and screen, glycemic control, insulin ordered. suggest gastroenterology consultation Diagnoses #1 GI bleeding #2 hyperglycemia #3 anemia #4 abdominal pain     Doug SouSam Raiza Kiesel, MD 03/26/14 [phone]

## 2014-03-26 NOTE — H&P (Signed)
Date: 03/26/2014               Patient Name:  [name] MRN: [id number]  DOB: 1945/12/27 Age / Sex: 68 y.o., female   PCP: Judie BonusElizabeth A Kollar, MD         Medical Service: Internal Medicine Teaching Service         Attending Physician: Dr. Jonah BlueAlejandro Paya, DO    First Contact: Dr. Mariea ClontsEmokpae Pager: [phone]  Second Contact: Dr. Sherrine MaplesGlenn Pager: [phone]       After Hours (After 5p/  First Contact Pager: [phone]  weekends / holidays): Second Contact Pager: [phone]   Chief Complaint: Rectal Bleed  History of Present Illness: 89 y o F with PMH of IBS, HLD, DM2, HTN, presented with c/o of Rectal bleed that started at about 4 am two nights ago, she woke up from sleep with an urgency to use the bathroom, and then blood and some stool came out, described the blood as really bright red and gushing out, she had several episodes of this, and finally remitted yesterday morning, but restarted at about 6pm later yesterday, had several episodes and hoped it would stop like it did earlier. She started feeling dizzy and sweaty at about 5am this morning, and so [facility]  No prior similar episodes, no blood in stools, or streaks of blood when she wipes, no dark stools, no vomiting of blood, No fever or chills, no weight loss. Pt endorses left lower quadrant pain, which she describes as a stitch in her side, and mostly comes on with change in position, like when she twists around or bends down at the waist. Also says she mostly has diarrhea, no constipation, but has hemorrhoids.   Meds: No current facility-administered medications for this encounter.   Current Outpatient Prescriptions  Medication Sig Dispense Refill  . DULoxetine (CYMBALTA) 60 MG capsule Take 1 capsule (60 mg total) by mouth daily.  30 capsule  6  . gabapentin (NEURONTIN) 300 MG capsule TAKE 1 CAPSULE BY MOUTH (3) THREE TIMES DAILY.  90 capsule  1  . insulin aspart (NOVOLOG) 100 UNIT/ML  injection Take 15 units with breakfast and lunch, 20 units with night time meal.  10 mL  10  . LANTUS 100 UNIT/ML injection INJECT 70 UNITS INTO THE SKIN DAILY AT BEDTIME.  10 mL  11  . lisinopril-hydrochlorothiazide (PRINZIDE,ZESTORETIC) 20-25 MG per tablet Take 1 tablet by mouth daily.  90 tablet  3  . metFORMIN (GLUCOPHAGE) 1000 MG tablet Take 1 tablet (1,000 mg total) by mouth 2 (two) times daily with a meal.  60 tablet  6  . NEXIUM 40 MG capsule TAKE 1 CAPSULE (40 MG TOTAL) BY MOUTH DAILY BEFORE BREAKFAST.  30 capsule  1  . pravastatin (PRAVACHOL) 80 MG tablet Take 1 tablet (80 mg total) by mouth daily.  30 tablet  6  . pravastatin (PRAVACHOL) 80 MG tablet TAKE 1 TABLET (80 MG TOTAL) BY MOUTH EVERY EVENING.  30 tablet  12    Allergies: Allergies as of 03/26/2014 - Review Complete 03/26/2014  Allergen Reaction Noted  . Morphine Other (See Comments)   . Codeine sulfate Other (See Comments)   . Meperidine hcl Nausea And Vomiting and Swelling   . Propoxyphene hcl Other (See Comments)    Past Medical History  Diagnosis Date  . GERD (gastroesophageal reflux disease)   . Hyperlipidemia   . Hypertension   . Diabetes mellitus  type II  . Depression   . Peripheral neuropathy   . Venous insufficiency   . Achilles tendinitis   . Right shoulder pain     Subacromial tendinitis  . Calcaneal spur     right  . Ventral hernia   . Cataract    Past Surgical History  Procedure Laterality Date  . Cholecystectomy  2001  . Abdominal hysterectomy  2001    TAH-BSO  . Incisional hernia repair    . Tonsillectomy     Family History  Problem Relation Age of Onset  . Heart failure Father     Died in 44s  . Colon cancer Neg Hx   . Ovarian cancer Sister   . Diabetes Sister   . Diabetes Paternal Grandmother   . Heart failure Mother     Died in 84s  . CVA Father    History   Social History  . Marital Status: Divorced    Spouse Name: N/A    Number of Children: 2  . Years of  Education: N/A   Occupational History  . 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 Smoking status: Never Smoker   . Smokeless tobacco: Never Used  . Alcohol Use: No  . Drug Use: No  . Sexual Activity: Not on file   Other Topics Concern  . 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 Patient needs to submit further paperwork to complete   Rudell Cobb  March 06, 2010 2:35 PM   Financial assistance approved for 100% discount at [facility] and has [facility] card   [facility]  March 12, 2010 3:55 PM    Review of Systems: CONSTITUTIONAL- No Fever, weightloss, night sweat,or change in appetite. HEAD- No Headaches RESPIRATORY- No Cough, or SOB. CARDIAC- No Palpitations, or chest pain. GI- No Dysphagia, no vomiting, diarhoea or constipation . URINARY- No Frequency, or dysuria. PYSCH- No depression or  Anxiety symptoms.   Physical Exam: Blood pressure 135/58, pulse 83, temperature 97.4 F (36.3 C), temperature source Oral, resp. rate 18, height 5\' 5"  (1.651 m), weight 220 lb (99.791 kg), SpO2 98.00%. GENERAL- Pleasant, alert, co-operative, appears as stated age, not in any distress. HEENT- Atraumatic, normocephalic, PERRL, EOMI, oral mucosa appears mildly dry, no cervical LN enlargement, thyroid does not appear enlarged. CARDIAC- RRR, no murmurs, rubs or gallops. RESP- Moving equal volumes of air, and clear to auscultation bilaterally. ABDOMEN- Soft,non tender,no guarding, no rebound, no palpable masses or organomegaly, bowel sounds present. Rectal exam- Some blood around the anus.  NEURO- No obvious Cr N abnormality, strenght equal and present in all extremities. EXTREMITIES- pulse 2+, symmetric, no pedal edema. SKIN- Warm, dry, Patchy- ~3cm circular  lesions on her back- appears stuck on. PSYCH- Normal mood and affect, appropriate thought content and speech.  Lab results: Basic Metabolic Panel:  Recent Labs  57/84/69 0732  NA 141  K 4.3  CL 104  CO2 25  GLUCOSE 308*  BUN 16  CREATININE 0.65  CALCIUM 8.4   Liver Function Tests:  Recent Labs  03/26/14 0732  AST 13  ALT 16  ALKPHOS 84  BILITOT 0.3  PROT 6.2  ALBUMIN 3.0*   CBC:  Recent Labs  03/26/14 0732  WBC 9.3  NEUTROABS 6.6  HGB 10.6*  HCT 32.2*  MCV 82.6  PLT 240   CBG:  Recent Labs  03/26/14 0936  GLUCAP 279*    Other results: EKG:  Pending.  Assessment & Plan by Problem:  Rectal bleed- Most likely etiology is a diverticular bleed- Considering description of painless, bright red bleed. Pt had a colonoscopy 07/01/2012- Which showed- Severe Diverticulosis in the Left Colon with internal hemorrhoids. With NO elevation in WBC and fever- unlikely diverticulitis. Doubt Brisk upper GI bleed, considering no hx of Upper abdominal pain or hematemesis or melena. Significant blood loss most likely with hx of dizziness and  Sweatiness. Vitals stable, CBC with Hgb- 10.6, down from 15.4  Three years ago, but stable at ~10. 1L of N/s given in the Ed - Admit to tele. - NPO - GI consult - CBC Q8H, - In and out - Cont IVF at 125cc/hr.  DM Uncontrolled- Last HgbA1c- 03/01/2014- 9.3. Home meds- Metformin- 500-1000mg  BID depending on the size of the meal she is having. Novolog- 15u- break fast and lunch,. 20u at night, Lantus- 70 u at bedtime. CBGs at home- she says hardly ever less than 140, and hardly ever had 240s. Blood sugars on admission- 340s, says he didn't take her novolog althrough yesterday. - SSI - Lantus 10u for now.   HTN- Blood pressure stable. HCTZ- Lisinpril- 20-25mg  Daily. - Hold blood pressure meds for now, in the setting of GI bleed   Dispo: Disposition is deferred at this time, awaiting improvement of current medical problems. Anticipated  discharge in approximately 2-3 day(s).   The patient does have a current PCP [name]([name], MD) and [facility] after discharge.  The patient does not have transportation limitations that hinder transportation to clinic appointments.  Signed: Kennis CarinaEjiroghene Kearra Calkin, MD 03/26/2014, 10:09 AM

## 2014-03-26 NOTE — Consult Note (Signed)
Consult for [facility]  Reason for Consult: Hematochezia Referring Physician: Teaching Service  [name] HPI: [name] with a PMH of left sided diverticula, DM, GERD, hyperlipidemia, and HTN who presented to the ER with complaints of hematochezia.  The bleeding started at 4 AM yesterday and it was painless.  She had multiple episodes and then it stopped only to recur at 6 PM last evening.  She thought that it would not recur, but then she had another bowel movement early this morning.  She presented to the ER at 5 AM.  No complaints of chest pain or SOB.  She had a colonoscopy with Dr. Leone PayorGessner in 2013 with findings of dense diverticula in the left side of the colon, but the CT scan in 2011 revealed scattered diverticula throughout the colon.  She does complain of some mild LLQ pain and epigastric pain, however, the epigastric pain is not new.  Her EGD in 2013 was negative for any overt abnormalities.  Past Medical History  Diagnosis Date  . GERD (gastroesophageal reflux disease)   . Hyperlipidemia   . Hypertension   . Diabetes mellitus     type II  . Depression   . Peripheral neuropathy   . Venous insufficiency   . Achilles tendinitis   . Right shoulder pain     Subacromial tendinitis  . Calcaneal spur     right  . Ventral hernia   . Cataract     Past Surgical History  Procedure Laterality Date  . Cholecystectomy  2001  . Abdominal hysterectomy  2001    TAH-BSO  . Incisional hernia repair    . Tonsillectomy      Family History  Problem Relation Age of Onset  . Heart failure Father     Died in 4880s  . Colon cancer Neg Hx   . Ovarian cancer Sister   . Diabetes Sister   . Diabetes Paternal Grandmother   . Heart failure Mother     Died in 6480s  . CVA Father     Social History:  reports that she has never smoked. She has never used smokeless tobacco. She reports that she does not drink alcohol or use illicit drugs.  Allergies:  Allergies   Allergen Reactions  . Morphine Other (See Comments)    'took me out of this world' I had to be resusitated  . Codeine Sulfate Other (See Comments)    REACTION: GI upset and pain  . Meperidine Hcl Nausea And Vomiting and Swelling  . Propoxyphene Hcl Other (See Comments)    unknown    Medications: Scheduled: Continuous:  Results for orders placed during the hospital encounter of 03/26/14 (from the past 24 hour(s))  CBC WITH DIFFERENTIAL     Status: Abnormal   Collection Time    03/26/14  7:32 AM      Result Value Ref Range   WBC 9.3  4.0 - 10.5 K/uL   RBC 3.90  3.87 - 5.11 MIL/uL   Hemoglobin 10.6 (*) 12.0 - 15.0 g/dL   HCT 16.132.2 (*) 09.636.0 - 04.546.0 %   MCV 82.6  78.0 - 100.0 fL   MCH 27.2  26.0 - 34.0 pg   MCHC 32.9  30.0 - 36.0 g/dL   RDW 40.914.0  81.111.5 - 91.415.5 %   Platelets 240  150 - 400 K/uL   Neutrophils Relative % 70  43 - 77 %   Neutro Abs 6.6  1.7 -  7.7 K/uL   Lymphocytes Relative 20  12 - 46 %   Lymphs Abs 1.8  0.7 - 4.0 K/uL   Monocytes Relative 7  3 - 12 %   Monocytes Absolute 0.7  0.1 - 1.0 K/uL   Eosinophils Relative 2  0 - 5 %   Eosinophils Absolute 0.2  0.0 - 0.7 K/uL   Basophils Relative 1  0 - 1 %   Basophils Absolute 0.1  0.0 - 0.1 K/uL  COMPREHENSIVE METABOLIC PANEL     Status: Abnormal   Collection Time    03/26/14  7:32 AM      Result Value Ref Range   Sodium 141  137 - 147 mEq/L   Potassium 4.3  3.7 - 5.3 mEq/L   Chloride 104  96 - 112 mEq/L   CO2 25  19 - 32 mEq/L   Glucose, Bld 308 (*) 70 - 99 mg/dL   BUN 16  6 - 23 mg/dL   Creatinine, Ser 9.600.65  0.50 - 1.10 mg/dL   Calcium 8.4  8.4 - 45.410.5 mg/dL   Total Protein 6.2  6.0 - 8.3 g/dL   Albumin 3.0 (*) 3.5 - 5.2 g/dL   AST 13  0 - 37 U/L   ALT 16  0 - 35 U/L   Alkaline Phosphatase 84  39 - 117 U/L   Total Bilirubin 0.3  0.3 - 1.2 mg/dL   GFR calc non Af Amer 90 (*) >90 mL/min   GFR calc Af Amer >90  >90 mL/min  TYPE AND SCREEN     Status: None   Collection Time    03/26/14  7:32 AM      Result  Value Ref Range   ABO/RH(D) A POS     Antibody Screen NEG     Sample Expiration 03/29/2014    TROPONIN I     Status: None   Collection Time    03/26/14  9:36 AM      Result Value Ref Range   Troponin I <0.30  <0.30 ng/mL  CBG MONITORING, ED     Status: Abnormal   Collection Time    03/26/14  9:36 AM      Result Value Ref Range   Glucose-Capillary 279 (*) 70 - 99 mg/dL     No results found.  ROS:  As stated above in the HPI otherwise negative.  Blood pressure 114/59, pulse 87, temperature 97.4 F (36.3 C), temperature source Oral, resp. rate 18, height 5\' 5"  (1.651 m), weight 220 lb (99.791 kg), SpO2 98.00%.    PE: Gen: NAD, Alert and Oriented HEENT:  [place]/AT, EOMI Neck: Supple, no LAD Lungs: CTA Bilaterally CV: RRR without M/G/R ABM: Soft, mild epigastric and LLQ tenderness, +BS Ext: No C/C/E  Assessment/Plan: 1) Diverticular bleed. 2) ? Diverticulaitis. 3) Epigastric pain.   The patient's clinical presentation is consistent with a diverticular bleed, but I do not know if there is a concomitant diverticulitis.  There is no fever and her WBC is normal.  I think it will be fine just to provide supportive care.  Hold on a CT scan at this time.  Her epigastric pain is not new.  Plan: 1) Follow HGB. 2) Transfuse as necessary.  Theda Belfastatrick D Abel Ra 03/26/2014, 11:31 AM

## 2014-03-27 ENCOUNTER — Observation Stay (HOSPITAL_COMMUNITY): Payer: Medicare Other

## 2014-03-27 ENCOUNTER — Encounter (HOSPITAL_COMMUNITY): Payer: Self-pay | Admitting: Radiology

## 2014-03-27 DIAGNOSIS — K625 Hemorrhage of anus and rectum: Secondary | ICD-10-CM

## 2014-03-27 DIAGNOSIS — E1165 Type 2 diabetes mellitus with hyperglycemia: Secondary | ICD-10-CM

## 2014-03-27 DIAGNOSIS — I10 Essential (primary) hypertension: Secondary | ICD-10-CM

## 2014-03-27 DIAGNOSIS — IMO0001 Reserved for inherently not codable concepts without codable children: Secondary | ICD-10-CM

## 2014-03-27 LAB — COMPREHENSIVE METABOLIC PANEL
ALT: 13 U/L (ref 0–35)
AST: 12 U/L (ref 0–37)
Albumin: 2.5 g/dL — ABNORMAL LOW (ref 3.5–5.2)
Alkaline Phosphatase: 62 U/L (ref 39–117)
BUN: 14 mg/dL (ref 6–23)
CO2: 19 mEq/L (ref 19–32)
Calcium: 7.4 mg/dL — ABNORMAL LOW (ref 8.4–10.5)
Chloride: 104 mEq/L (ref 96–112)
Creatinine, Ser: 0.68 mg/dL (ref 0.50–1.10)
GFR calc Af Amer: >90 mL/min (ref >90)
GFR calc non Af Amer: 89 mL/min — ABNORMAL LOW (ref >90)
Glucose, Bld: 335 mg/dL — ABNORMAL HIGH (ref 70–99)
Potassium: 4.1 mEq/L (ref 3.7–5.3)
Sodium: 136 mEq/L — ABNORMAL LOW (ref 137–147)
Total Bilirubin: 0.2 mg/dL — ABNORMAL LOW (ref 0.3–1.2)
Total Protein: 5.1 g/dL — ABNORMAL LOW (ref 6.0–8.3)

## 2014-03-27 LAB — CBC
HCT: 27.9 % — ABNORMAL LOW (ref 36.0–46.0)
HCT: 22.9 % — ABNORMAL LOW (ref 36.0–46.0)
HCT: 23.6 % — ABNORMAL LOW (ref 36.0–46.0)
HCT: 22.4 % — ABNORMAL LOW (ref 36.0–46.0)
Hemoglobin: 9 g/dL — ABNORMAL LOW (ref 12.0–15.0)
Hemoglobin: 7.5 g/dL — ABNORMAL LOW (ref 12.0–15.0)
Hemoglobin: 8 g/dL — ABNORMAL LOW (ref 12.0–15.0)
Hemoglobin: 7.6 g/dL — ABNORMAL LOW (ref 12.0–15.0)
MCH: 26.9 pg (ref 26.0–34.0)
MCH: 27.2 pg (ref 26.0–34.0)
MCH: 28 pg (ref 26.0–34.0)
MCH: 27.8 pg (ref 26.0–34.0)
MCHC: 32.3 g/dL (ref 30.0–36.0)
MCHC: 32.8 g/dL (ref 30.0–36.0)
MCHC: 33.9 g/dL (ref 30.0–36.0)
MCHC: 33.9 g/dL (ref 30.0–36.0)
MCV: 83.3 fL (ref 78.0–100.0)
MCV: 83 fL (ref 78.0–100.0)
MCV: 82.5 fL (ref 78.0–100.0)
MCV: 82.1 fL (ref 78.0–100.0)
Platelets: 283 10*3/uL (ref 150–400)
Platelets: 275 10*3/uL (ref 150–400)
Platelets: 211 10*3/uL (ref 150–400)
Platelets: 209 10*3/uL (ref 150–400)
RBC: 3.35 MIL/uL — ABNORMAL LOW (ref 3.87–5.11)
RBC: 2.76 MIL/uL — ABNORMAL LOW (ref 3.87–5.11)
RBC: 2.86 MIL/uL — ABNORMAL LOW (ref 3.87–5.11)
RBC: 2.73 MIL/uL — ABNORMAL LOW (ref 3.87–5.11)
RDW: 14.3 % (ref 11.5–15.5)
RDW: 14.5 % (ref 11.5–15.5)
RDW: 14.2 % (ref 11.5–15.5)
RDW: 14.4 % (ref 11.5–15.5)
WBC: 10.9 10*3/uL — ABNORMAL HIGH (ref 4.0–10.5)
WBC: 12.7 10*3/uL — ABNORMAL HIGH (ref 4.0–10.5)
WBC: 10.8 10*3/uL — ABNORMAL HIGH (ref 4.0–10.5)
WBC: 11.8 10*3/uL — ABNORMAL HIGH (ref 4.0–10.5)

## 2014-03-27 LAB — PREPARE RBC (CROSSMATCH)

## 2014-03-27 LAB — GLUCOSE, CAPILLARY
Glucose-Capillary: 159 mg/dL — ABNORMAL HIGH (ref 70–99)
Glucose-Capillary: 353 mg/dL — ABNORMAL HIGH (ref 70–99)
Glucose-Capillary: 243 mg/dL — ABNORMAL HIGH (ref 70–99)
Glucose-Capillary: 193 mg/dL — ABNORMAL HIGH (ref 70–99)
Glucose-Capillary: 192 mg/dL — ABNORMAL HIGH (ref 70–99)
Glucose-Capillary: 217 mg/dL — ABNORMAL HIGH (ref 70–99)

## 2014-03-27 LAB — AMYLASE: Amylase: 15 U/L (ref 0–105)

## 2014-03-27 LAB — LACTIC ACID, PLASMA
Lactic Acid, Venous: 2.8 mmol/L — ABNORMAL HIGH (ref 0.5–2.2)
Lactic Acid, Venous: 0.9 mmol/L (ref 0.5–2.2)

## 2014-03-27 LAB — LACTATE DEHYDROGENASE: LDH: 110 U/L (ref 94–250)

## 2014-03-27 MED ORDER — SODIUM CHLORIDE 0.9 % IV BOLUS (SEPSIS)
500.0000 mL | Freq: Once | INTRAVENOUS | Status: AC
  Administered 2014-03-27: 500 mL via INTRAVENOUS

## 2014-03-27 MED ORDER — SODIUM CHLORIDE 0.9 % IV SOLN
Freq: Once | INTRAVENOUS | Status: AC
  Administered 2014-03-27: 13:00:00 via INTRAVENOUS

## 2014-03-27 MED ORDER — TECHNETIUM TC 99M-LABELED RED BLOOD CELLS IV KIT
25.0000 | PACK | Freq: Once | INTRAVENOUS | Status: AC | PRN
  Administered 2014-03-27: 25 via INTRAVENOUS

## 2014-03-27 MED ORDER — FENTANYL CITRATE 0.05 MG/ML IJ SOLN: 12.5000 ug | Freq: Once | INTRAMUSCULAR | Status: DC

## 2014-03-27 MED ORDER — PANTOPRAZOLE SODIUM 40 MG PO TBEC
40.0000 mg | DELAYED_RELEASE_TABLET | Freq: Two times a day (BID) | ORAL | Status: DC
  Administered 2014-03-27 – 2014-03-31 (×9): 40 mg via ORAL
  Filled 2014-03-27 (×8): qty 1

## 2014-03-27 MED ORDER — PIPERACILLIN-TAZOBACTAM 3.375 G IVPB
3.3750 g | Freq: Three times a day (TID) | INTRAVENOUS | Status: DC
  Filled 2014-03-27 (×2): qty 50

## 2014-03-27 MED ORDER — SODIUM CHLORIDE 0.9 % IV BOLUS (SEPSIS)
1000.0000 mL | Freq: Once | INTRAVENOUS | Status: AC
  Administered 2014-03-27: 1000 mL via INTRAVENOUS

## 2014-03-27 MED ORDER — VANCOMYCIN HCL 10 G IV SOLR
1250.0000 mg | Freq: Two times a day (BID) | INTRAVENOUS | Status: DC
  Administered 2014-03-27: 1250 mg via INTRAVENOUS
  Filled 2014-03-27: qty 1250

## 2014-03-27 MED ORDER — DIPHENHYDRAMINE HCL 50 MG/ML IJ SOLN
12.5000 mg | Freq: Once | INTRAMUSCULAR | Status: AC
  Administered 2014-03-27: 12.5 mg via INTRAVENOUS
  Filled 2014-03-27: qty 1

## 2014-03-27 MED ORDER — SODIUM CHLORIDE 0.9 % IV SOLN
Freq: Once | INTRAVENOUS | Status: AC
  Administered 2014-03-27: 16:00:00 via INTRAVENOUS

## 2014-03-27 MED ORDER — IOHEXOL 300 MG/ML  SOLN
100.0000 mL | Freq: Once | INTRAMUSCULAR | Status: AC | PRN
  Administered 2014-03-27: 100 mL via INTRAVENOUS

## 2014-03-27 NOTE — H&P (Signed)
INTERNAL MEDICINE TEACHING SERVICE Attending Admission Note  Date: 03/27/2014  Patient name: [name]  Medical record number: [id number]  Date of birth: 1946/05/02    I have seen and evaluated Cynthia Lexuth Frances Latini and discussed their care with the Residency Team.  68 yr old woman with pmhx significant for colonic diverticulosis, IBS, HTN, Type 2 DM, presented with hematochezia. She admitted to first episode two nights ago. These episodes have repeated numerous times. She admitted to diaphoresis and dizziness and thus came to the ED.  Initial vital signs indicated hemodynamic stability. But, Hgb has continued to drop from admission value of 10 to a value of 7.5 early this morning. Of note, her baseline Hgb is last know to be 15 in 2011. Further episodes of bloody BM's noted overnight. She appears to be more lethargic overnight.  She was noted to have a HR increased to 124, but no hypotension. A CT was obtained due to concern for ischemic bowel given a slightly elevated lactic acid, but no evidence of this is seen. I agree that clinical picture is consistent with a diverticular bleed. Given ongoing need for transfusion and continued bloody BM's with night team called multiple times, tagged RBC scan performed which has just been read as negative. At this time, given continued bleeding, continue to follow H/H every 6 hours. Transfuse as needed. Unfortunately with a negative tagged RBC scan IR will not be able to identify and embolize any area at this time. If continues to require significant transfusions, develops hemodynamic instability, would require GS consultation. Continue to update and follow along with Dr. Elnoria HowardHung.  Jonah BlueAlejandro Jeremaine Maraj, DO, FACP [facility] 03/27/2014, 12:19 PM

## 2014-03-27 NOTE — Progress Notes (Signed)
S: Called by nurse that patient had ~500cc BRBPR with clots. Patient feels weak. No active bleeding but she "feels like I have to pass gas but I think it's going to be blood." After we left called by nurse that she had another large bloody bowel movement.  O:  Filed Vitals:   03/26/14 2306  BP: 125/61  Pulse: 88  Temp: 97.8 F (36.6 C)  Resp: 18   HR 104  Physical Exam  Constitutional:  Lying in bed, A&Ox3, appears tired.  Cardiovascular: Normal rate and regular rhythm.  Exam reveals no gallop and no friction rub.   No murmur heard. Pulmonary/Chest: Effort normal and breath sounds normal. No respiratory distress. She has no wheezes. She has no rales. She exhibits no tenderness.   A/P: 68 yo F here with diverticular bleed. Seen by Dr. Elnoria HowardHung this morning. We have been providing IVF and trending hemoglobins. They have been stable as below. Patient now with an episode of BRBPR of about ~500cc, followed by another large bloody BM after we left the room. I spoke with Dr. Elnoria HowardHung over the phone, he recommends continued fluids and blood products if needed to support her through the diverticular bleed. If bleeding is intractable the next step would be to call surgery for colectomy. Right now, SBP 140s but developing tachycardia (104). - Stat CBC, lactic acid - Bolus NS 500cc (history of grade 2 diastolic dysfunction, preserved EF) followed by IVF NS 250cc/hr after that - Additional bolus prn - Monitor vital signs - Transfuse as needed  Hemoglobin  Date Value Ref Range Status  03/26/2014 9.9* 12.0 - 15.0 g/dL Final  5/78/46964/18/2015 29.510.0* 12.0 - 15.0 g/dL Final  2/84/13244/18/2015 40.110.6* 12.0 - 15.0 g/dL Final  0/27/25365/17/2011 64.415.4* (12.0-15.0 g/dL Final    Vivi BarrackSarah Diondra Pines, MD  [email or web address] Pager # [phone] After hours and weekends # [phone] Office # [phone]

## 2014-03-27 NOTE — Progress Notes (Signed)
Utilization review completed.  

## 2014-03-27 NOTE — Progress Notes (Signed)
ANTIBIOTIC CONSULT NOTE - INITIAL  Pharmacy Consult for Vancomycin/Zosyn  Indication: rule out sepsis  Allergies  Allergen Reactions  . Morphine Other (See Comments)    'took me out of this world' I had to be resusitated  . Codeine Sulfate Other (See Comments)    REACTION: GI upset and pain  . Meperidine Hcl Nausea And Vomiting and Swelling  . Propoxyphene Hcl Other (See Comments)    unknown    Patient Measurements: Height: 5\' 5"  (165.1 cm) Weight: 214 lb 4.6 oz (97.2 kg) IBW/kg (Calculated) : 57  Vital Signs: Temp: 97.7 F (36.5 C) (04/19 0302) Temp src: Oral (04/19 0302) BP: 115/57 mmHg (04/19 0302) Pulse Rate: 124 (04/19 0302)  Labs:  Recent Labs  03/26/14 0732  03/26/14 2034 03/27/14 0102 03/27/14 0345  WBC 9.3  < > 9.5 10.9* 12.7*  HGB 10.6*  < > 9.9* 9.0* 7.5*  PLT 240  < > 256 283 275  CREATININE 0.65  --   --   --  0.68  < > = values in this interval not displayed.  Medical History: Past Medical History  Diagnosis Date  . GERD (gastroesophageal reflux disease)   . Hyperlipidemia   . Hypertension   . Diabetes mellitus     type II  . Depression   . Peripheral neuropathy   . Venous insufficiency   . Achilles tendinitis   . Right shoulder pain     Subacromial tendinitis  . Calcaneal spur     right  . Ventral hernia   . Cataract     Assessment: 68 y/o F with abdominal pain and bloody stools. WBC 12.7, increasing lethargy, concern for ischemic bowel/abdominal infection.   Goal of Therapy:  Vancomycin trough level 15-20 mcg/ml  Plan:  -Vancomycin 1250 mg IV q12h -Zosyn 3.375G IV q8h to be infused over 4 hours -Trend WBC, temp, renal function  -Drug levels as indicated   Abran DukeJames Joie Hipps 03/27/2014,4:46 AM

## 2014-03-27 NOTE — Progress Notes (Addendum)
S: Spoke with nurse and patient is still having bloody BMs. Saw and evaluated the patient again. She is alert and oriented but appears more lethargic than my earlier assessment. Complaining of 3/10 abdominal pain in lower quadrants.   O: Filed Vitals:   03/27/14 0302  BP: 115/57  Pulse: 124  Temp: 97.7 F (36.5 C)  Resp: 14   SBP 140s on our last assessment  Hemoglobin dropped 9.9 > 9.0 on last check. Another being drawn now. Lactate elevated to 2.8.  Physical Exam  Constitutional: She is oriented to person, place, and time.  Lethargic, diaphoretic, but alert and oriented.  Cardiovascular: Tachycardia present.   Abdominal: Soft. She exhibits no distension and no mass. There is tenderness (To palpation of lower quadrants). There is no rebound and no guarding.  Neurological: She is alert and oriented to person, place, and time.  Skin:  Capillary refill wnl, hands are cool but feet are warm and well perfused.    A/P: Concern for developing hypovolemic shock 2/2 acute blood loss. Tachycardia worsening, BP stable but SBP decreased from 140s to 110s since our last assessment. Still actively bleeding per rectum, last BM 20 minutes ago. Diaphoretic on exam and mental status is more foggy. Given elevated lactate (2.8) we should rule out ischemic bowel. Will obtain stat CT abdomen/pelvis with contrast. Given active bleeding and signs of hypovolemic shock, will transfuse 1U PRBCs when she returns from CT. Patient has already been type and screened. Repeat CBC being drawn now. We will also follow up a post-transfusion CBC.  Dr. Arlyn Leaketering was [facility]. Given stable BP, the patient does not meet ICU status at this time. We will continue to monitor, she agrees with the plan and also suggests pain control as needed as tachycardia could be pain-related. Patient has a morphine allergy, but we can give small doses of Fentanyl prn.   Vivi BarrackSarah Hai Grabe,  MD  [email or web address] Pager # [phone] After hours and weekends # [phone] Office # [phone]

## 2014-03-27 NOTE — Progress Notes (Addendum)
Subjective: Lying in bed comfortably. Not in any distress. No complaints. Had significant bleeding episodes overnight- Per pts nurse up to a Litre, with Clots.  Objective: Vital signs in last 24 hours: Filed Vitals:   03/27/14 0645 03/27/14 0700 03/27/14 0732 03/27/14 0748  BP: 114/67 122/64 125/57 129/106  Pulse: 110 107 103 105  Temp:  98.2 F (36.8 C)  97.9 F (36.6 C)  TempSrc:  Oral  Oral  Resp: 17 17 16 17   Height:      Weight:      SpO2: 99% 99% 99% 99%   Weight change: -5 lb 11.4 oz (-2.591 kg)  Intake/Output Summary (Last 24 hours) at 03/27/14 1100 Last data filed at 03/27/14 0630  Gross per 24 hour  Intake 1423.33 ml  Output    175 ml  Net 1248.33 ml    Lab Results: Basic Metabolic Panel:  Recent Labs Lab 03/26/14 0732 03/27/14 0345  NA 141 136*  K 4.3 4.1  CL 104 104  CO2 25 19  GLUCOSE 308* 335*  BUN 16 14  CREATININE 0.65 0.68  CALCIUM 8.4 7.4*   Liver Function Tests:  Recent Labs Lab 03/26/14 0732 03/27/14 0345  AST 13 12  ALT 16 13  ALKPHOS 84 62  BILITOT 0.3 0.2*  PROT 6.2 5.1*  ALBUMIN 3.0* 2.5*    Recent Labs Lab 03/27/14 0345  AMYLASE 15   CBC:  Recent Labs Lab 03/26/14 0732  03/27/14 0102 03/27/14 0345  WBC 9.3  < > 10.9* 12.7*  NEUTROABS 6.6  --   --   --   HGB 10.6*  < > 9.0* 7.5*  HCT 32.2*  < > 27.9* 22.9*  MCV 82.6  < > 83.3 83.0  PLT 240  < > 283 275  < > = values in this interval not displayed. Cardiac Enzymes:  Recent Labs Lab 03/26/14 0936  TROPONINI <0.30   CBG:  Recent Labs Lab 03/26/14 1207 03/26/14 1511 03/26/14 1952 03/26/14 2305 03/27/14 0259 03/27/14 0733  GLUCAP 229* 157* 152* 159* 353* 243*   Micro Results: Recent Results (from the past 240 hour(s))  MRSA PCR SCREENING     Status: None   Collection Time    03/26/14 11:57 AM      Result Value Ref Range Status   MRSA by PCR NEGATIVE  NEGATIVE Final   Comment:            The GeneXpert MRSA Assay (FDA     approved for NASAL  specimens     only), is one component of a     comprehensive MRSA colonization     surveillance program. It is not     intended to diagnose MRSA     infection nor to guide or     monitor treatment for     MRSA infections.   Studies/Results: Ct Abdomen Pelvis W Contrast  03/27/2014   CLINICAL DATA:  r/o ischemic colitis with elevated lactate, bloody stool, abdominal pain on admission   IMPRESSION: 1. No CT evidence of ischemic colitis or other acute abdominopelvic process. 2. Colonic diverticulosis without acute diverticulitis. 3. Status post cholecystectomy. 4. Small fat containing ventral hernia.   Electronically Signed   By: [name]  McClintock M.D.   On: 03/27/2014 05:02   Medications: I have reviewed the patient's current medications. Scheduled Meds: . DULoxetine  60 mg Oral Daily  . insulin aspart  0-15 Units Subcutaneous 6 times per day  . insulin glargine  10  Units Subcutaneous Q24H  . pantoprazole  40 mg Oral BID  . sodium chloride  3 mL Intravenous Q12H   Continuous Infusions: . sodium chloride 1,000 mL (03/27/14 0621)   PRN Meds:.ondansetron (ZOFRAN) IV, ondansetron Assessment/Plan:  Rectal bleed- Most likely due diverticular bleed- Considering description of painless, bright red bleed. Colonoscopy 07/01/2012- Which showed- Severe Diverticulosis in the Left Colon with internal hemorrhoids. Had signifiacnt bleeding last night, with Hgb drop to 7.5, from 10.6 on admission. Got 1 unit of blood. Red blood cell tag ordered, to determine source of bleed, but results negative. Ct abdomen negative for diverticulitis and ischemic colitis. - Continue to monitor CBCs- Q6H, and transfuse, goal Hgb>8. - GI consulted, recs appreciated - Give 500mls of N/s bolus - In and out  - Cont IVF at 125cc/hr. - General surgery consult if rectal bleed causes hemodynamic instabilty.   DM Uncontrolled- Last HgbA1c- 03/01/2014- 9.3. Home meds- Metformin- 500-1000mg  BID depending on the size of the  meal she is having. Novolog- 15u- break fast and lunch,. 20u at night, Lantus- 70 u at bedtime. CBGs at home- she says hardly ever less than 140, and hardly ever had 240s.  - SSI  - Lantus 10u for now.   HTN- Blood pressure stable. HCTZ- Lisinpril- 20-25mg  Daily.  - Hold blood pressure meds for now, in the setting of GI bleed  Dispo: Disposition is deferred at this time, awaiting improvement of current medical problems.   The patient does have a current PCP [name]([name], MD) and [facility] after discharge.  The patient does have transportation limitations that hinder transportation to clinic appointments.  .Services Needed at time of discharge: Y = Yes, Blank = No PT:   OT:   RN:   Equipment:   Other:     LOS: 1 day   Cynthia CarinaEjiroghene Iaan Oregel, MD 03/27/2014, 11:00 AM

## 2014-03-27 NOTE — Progress Notes (Signed)
Subjective: Rough night with bleeding.  Objective: Vital signs in last 24 hours: Temp:  [97.7 F (36.5 C)-98.9 F (37.2 C)] 97.7 F (36.5 C) (04/19 0630) Pulse Rate:  [84-131] 110 (04/19 0645) Resp:  [14-26] 17 (04/19 0645) BP: (87-144)/(51-81) 114/67 mmHg (04/19 0645) SpO2:  [90 %-100 %] 99 % (04/19 0645) Weight:  [214 lb 4.6 oz (97.2 kg)] 214 lb 4.6 oz (97.2 kg) (04/18 1152) Last BM Date: 03/26/14  Intake/Output from previous day: 04/18 0701 - 04/19 0700 In: 1423.3 [I.V.:1108.3; Blood:315] Out: 175 [Urine:175] Intake/Output this shift:    General appearance: alert and fatigued appearing GI: tender in the LLQ  Lab Results:  Recent Labs  03/26/14 2034 03/27/14 0102 03/27/14 0345  WBC 9.5 10.9* 12.7*  HGB 9.9* 9.0* 7.5*  HCT 30.8* 27.9* 22.9*  PLT 256 283 275   BMET  Recent Labs  03/26/14 0732 03/27/14 0345  NA 141 136*  K 4.3 4.1  CL 104 104  CO2 25 19  GLUCOSE 308* 335*  BUN 16 14  CREATININE 0.65 0.68  CALCIUM 8.4 7.4*   LFT  Recent Labs  03/27/14 0345  PROT 5.1*  ALBUMIN 2.5*  AST 12  ALT 13  ALKPHOS 62  BILITOT 0.2*   PT/INR No results found for this basename: LABPROT, INR,  in the last 72 hours Hepatitis Panel No results found for this basename: HEPBSAG, HCVAB, HEPAIGM, HEPBIGM,  in the last 72 hours C-Diff No results found for this basename: CDIFFTOX,  in the last 72 hours Fecal Lactopherrin No results found for this basename: FECLLACTOFRN,  in the last 72 hours  Studies/Results: Ct Abdomen Pelvis W Contrast  03/27/2014   CLINICAL DATA:  r/o ischemic colitis with elevated lactate, bloody stool, abdominal pain on admission  EXAM: CT ABDOMEN AND PELVIS WITH CONTRAST  TECHNIQUE: Multidetector CT imaging of the abdomen and pelvis was performed using the standard protocol following bolus administration of intravenous contrast.  CONTRAST:  100mL OMNIPAQUE IOHEXOL 300 MG/ML  SOLN  COMPARISON:  Prior CT from 04/25/2010.  FINDINGS: Mild  subsegmental atelectasis seen dependently within the lung bases.  The liver demonstrates a normal contrast enhanced appearance. Gallbladder is surgically absent. No biliary ductal dilatation. The spleen and adrenal glands are within normal limits. Mild fatty infiltration noted within the pancreas.  Kidneys are equal in size with symmetric enhancement. No nephrolithiasis, hydronephrosis, or focal enhancing renal mass.  No evidence of bowel obstruction. No abnormal wall thickening or inflammatory fat stranding seen about the bowels to suggest inflammation or ischemia. No portal venous gas. No pneumatosis. Scattered colonic diverticula present without acute diverticulitis. Appendix is normal.  Fat containing ventral hernia noted.  Bladder is within normal limits. Uterus and ovaries are not visualized.  No free air or fluid. No enlarged intra-abdominal or pelvic lymph nodes. Normal intravascular enhancement seen throughout the intra-abdominal aorta and its branch vessels. The celiac axis, SMA, and IMA are opacified.  No acute osseous abnormality. No worrisome lytic or blastic osseous lesions. Normal limits.  IMPRESSION: 1. No CT evidence of ischemic colitis or other acute abdominopelvic process. 2. Colonic diverticulosis without acute diverticulitis. 3. Status post cholecystectomy. 4. Small fat containing ventral hernia.   Electronically Signed   By: [name]  McClintock M.D.   On: 03/27/2014 05:02    Medications:  Scheduled: . DULoxetine  60 mg Oral Daily  . fentaNYL  12.5 mcg Intravenous Once  . insulin aspart  0-15 Units Subcutaneous 6 times per day  . insulin glargine  10 Units Subcutaneous Q24H  . pantoprazole  40 mg Oral BID  . sodium chloride  3 mL Intravenous Q12H   Continuous: . sodium chloride 1,000 mL (03/27/14 16100621)    Assessment/Plan: 1) Diverticular bleed. 2) LLQ ABM pain.   HGB dropped with the hematochezia.  Overall she is stable.  A CT scan was performed and there was no evidence of  diverticulitis or ischemic colitis.  Plan: 1) Agree with blood transfusion. 2) Continue to monitor HGB.  LOS: 1 day   Theda Belfastatrick D Herve Haug 03/27/2014, 7:24 AM

## 2014-03-28 DIAGNOSIS — K5730 Diverticulosis of large intestine without perforation or abscess without bleeding: Secondary | ICD-10-CM

## 2014-03-28 DIAGNOSIS — K922 Gastrointestinal hemorrhage, unspecified: Secondary | ICD-10-CM

## 2014-03-28 DIAGNOSIS — D62 Acute posthemorrhagic anemia: Secondary | ICD-10-CM

## 2014-03-28 DIAGNOSIS — K5731 Diverticulosis of large intestine without perforation or abscess with bleeding: Principal | ICD-10-CM

## 2014-03-28 LAB — GLUCOSE, CAPILLARY
Glucose-Capillary: 205 mg/dL — ABNORMAL HIGH (ref 70–99)
Glucose-Capillary: 141 mg/dL — ABNORMAL HIGH (ref 70–99)
Glucose-Capillary: 149 mg/dL — ABNORMAL HIGH (ref 70–99)
Glucose-Capillary: 144 mg/dL — ABNORMAL HIGH (ref 70–99)
Glucose-Capillary: 198 mg/dL — ABNORMAL HIGH (ref 70–99)
Glucose-Capillary: 260 mg/dL — ABNORMAL HIGH (ref 70–99)

## 2014-03-28 LAB — CBC
HCT: 23.2 % — ABNORMAL LOW (ref 36.0–46.0)
HCT: 22.7 % — ABNORMAL LOW (ref 36.0–46.0)
HCT: 24.3 % — ABNORMAL LOW (ref 36.0–46.0)
Hemoglobin: 7.9 g/dL — ABNORMAL LOW (ref 12.0–15.0)
Hemoglobin: 7.7 g/dL — ABNORMAL LOW (ref 12.0–15.0)
Hemoglobin: 8.4 g/dL — ABNORMAL LOW (ref 12.0–15.0)
MCH: 28.3 pg (ref 26.0–34.0)
MCH: 28.6 pg (ref 26.0–34.0)
MCH: 29.1 pg (ref 26.0–34.0)
MCHC: 34.1 g/dL (ref 30.0–36.0)
MCHC: 33.9 g/dL (ref 30.0–36.0)
MCHC: 34.6 g/dL (ref 30.0–36.0)
MCV: 83.2 fL (ref 78.0–100.0)
MCV: 84.4 fL (ref 78.0–100.0)
MCV: 84.1 fL (ref 78.0–100.0)
Platelets: 181 10*3/uL (ref 150–400)
Platelets: 172 10*3/uL (ref 150–400)
Platelets: 144 10*3/uL — ABNORMAL LOW (ref 150–400)
RBC: 2.79 MIL/uL — ABNORMAL LOW (ref 3.87–5.11)
RBC: 2.69 MIL/uL — ABNORMAL LOW (ref 3.87–5.11)
RBC: 2.89 MIL/uL — ABNORMAL LOW (ref 3.87–5.11)
RDW: 14.5 % (ref 11.5–15.5)
RDW: 14.7 % (ref 11.5–15.5)
RDW: 14.7 % (ref 11.5–15.5)
WBC: 12.1 10*3/uL — ABNORMAL HIGH (ref 4.0–10.5)
WBC: 11.2 10*3/uL — ABNORMAL HIGH (ref 4.0–10.5)
WBC: 9.4 10*3/uL (ref 4.0–10.5)

## 2014-03-28 LAB — PREPARE RBC (CROSSMATCH)

## 2014-03-28 MED ORDER — ACETAMINOPHEN 325 MG PO TABS
650.0000 mg | ORAL_TABLET | Freq: Four times a day (QID) | ORAL | Status: DC | PRN
  Administered 2014-03-28: 650 mg via ORAL
  Filled 2014-03-28: qty 2

## 2014-03-28 NOTE — Progress Notes (Addendum)
Subjective: Last bleeding episode 30hrs ago. No complaints, except legs are a bit puffy. No SOB.  Objective: Vital signs in last 24 hours: Filed Vitals:   03/28/14 0734 03/28/14 1034 03/28/14 1039 03/28/14 1054  BP: 112/53 102/84  100/60  Pulse: 96 97 97 101  Temp: 99.4 F (37.4 C) 98.6 F (37 C)  99.6 F (37.6 C)  TempSrc: Oral Oral  Oral  Resp: 18 20 21 21   Height:      Weight:      SpO2: 98% 96% 95% 94%   Weight change:   Intake/Output Summary (Last 24 hours) at 03/28/14 1103 Last data filed at 03/28/14 1039  Gross per 24 hour  Intake 3108.33 ml  Output    750 ml  Net 2358.33 ml    Lab Results: Basic Metabolic Panel:  Recent Labs Lab 03/26/14 0732 03/27/14 0345  NA 141 136*  K 4.3 4.1  CL 104 104  CO2 25 19  GLUCOSE 308* 335*  BUN 16 14  CREATININE 0.65 0.68  CALCIUM 8.4 7.4*   Liver Function Tests:  Recent Labs Lab 03/26/14 0732 03/27/14 0345  AST 13 12  ALT 16 13  ALKPHOS 84 62  BILITOT 0.3 0.2*  PROT 6.2 5.1*  ALBUMIN 3.0* 2.5*    Recent Labs Lab 03/27/14 0345  AMYLASE 15   CBC:  Recent Labs Lab 03/26/14 0732  03/28/14 0112 03/28/14 0600  WBC 9.3  < > 12.1* 11.2*  NEUTROABS 6.6  --   --   --   HGB 10.6*  < > 7.9* 7.7*  HCT 32.2*  < > 23.2* 22.7*  MCV 82.6  < > 83.2 84.4  PLT 240  < > 181 172  < > = values in this interval not displayed. Cardiac Enzymes:  Recent Labs Lab 03/26/14 0936  TROPONINI <0.30   CBG:  Recent Labs Lab 03/27/14 1247 03/27/14 1512 03/27/14 2018 03/27/14 2324 03/28/14 0332 03/28/14 0730  GLUCAP 193* 192* 217* 205* 141* 149*   Micro Results: Recent Results (from the past 240 hour(s))  MRSA PCR SCREENING     Status: None   Collection Time    03/26/14 11:57 AM      Result Value Ref Range Status   MRSA by PCR NEGATIVE  NEGATIVE Final   Comment:            The GeneXpert MRSA Assay (FDA     approved for NASAL specimens     only), is one component of a     comprehensive MRSA colonization      surveillance program. It is not     intended to diagnose MRSA     infection nor to guide or     monitor treatment for     MRSA infections.   Studies/Results: Ct Abdomen Pelvis W Contrast  03/27/2014   CLINICAL DATA:  r/o ischemic colitis with elevated lactate, bloody stool, abdominal pain on admission   IMPRESSION: 1. No CT evidence of ischemic colitis or other acute abdominopelvic process. 2. Colonic diverticulosis without acute diverticulitis. 3. Status post cholecystectomy. 4. Small fat containing ventral hernia.   Electronically Signed   By: [name]  McClintock M.D.   On: 03/27/2014 05:02   Medications: I have reviewed the patient's current medications. Scheduled Meds: . DULoxetine  60 mg Oral Daily  . insulin aspart  0-15 Units Subcutaneous 6 times per day  . insulin glargine  10 Units Subcutaneous Q24H  . pantoprazole  40 mg Oral BID  .  sodium chloride  3 mL Intravenous Q12H   Continuous Infusions: . sodium chloride 1,000 mL (03/27/14 2152)   PRN Meds:.acetaminophen, ondansetron (ZOFRAN) IV, ondansetron Assessment/Plan:  Rectal bleed- Most likely due diverticular bleed- Considering description of painless, bright red bleed. Colonoscopy 07/01/2012- Which showed- Severe Diverticulosis in the Left Colon with internal hemorrhoids. Last bleeding episode over 30hrs ago. Red blood cell tag was negative. Ct abdomen- 03/27/2014- negative for diverticulitis and ischemic colitis. Hgb After 2 units- 7.7.  Pt has received 3 units of blood so far.  - Monitor CBCs- Q6H, and transfuse, goal Hgb>8. - GI consulted, recs appreciated - Monitor In and out  - Cont IVF at 125cc/hr. - General surgery consult if rectal bleed causes hemodynamic instabilty.  Acute blood loss anemia- Documented Hgb- 2011- 15.4. On admission- 10.6. Likley due to Diverticulosis. - Follow CBCs Q6H and transfuse, goal- >8.   DM Uncontrolled- Last HgbA1c- 03/01/2014- 9.3. Home meds- Metformin- 500-1000mg  BID depending on  the size of the meal she is having. Novolog- 15u- break fast and lunch,. 20u at night, Lantus- 70 u at bedtime.  - SSI  - Lantus 10u for now.   HTN- Blood pressure stable. HCTZ- Lisinpril- 20-25mg  Daily.  - Hold blood pressure meds for now, in the setting of GI bleed  Dispo: Disposition is deferred at this time, awaiting improvement of current medical problems.   The patient does have a current PCP [name](Cynthia A Kollar, MD) and [facility] after discharge.  The patient does have transportation limitations that hinder transportation to clinic appointments.  .Services Needed at time of discharge: Y = Yes, Blank = No PT:   OT:   RN:   Equipment:   Other:     LOS: 2 days   Cynthia CarinaEjiroghene Patirica Longshore, MD 03/28/2014, 11:03 AM

## 2014-03-28 NOTE — Progress Notes (Signed)
Daily Rounding Note  03/28/2014, 10:03 AM  LOS: 2 days   SUBJECTIVE:       Last bleeding was ~ 36 hours ago or more.   No abd pain, not dizzy.  Hgb down, so transfusion orderd.   OBJECTIVE:         Vital signs in last 24 hours:    Temp:  [98.1 F (36.7 C)-99.4 F (37.4 C)] 99.4 F (37.4 C) (04/20 0734) Pulse Rate:  [96-107] 96 (04/20 0734) Resp:  [16-20] 18 (04/20 0734) BP: (90-128)/(32-70) 112/53 mmHg (04/20 0734) SpO2:  [90 %-98 %] 98 % (04/20 0734) Last BM Date: 03/27/14 General: looks well, overweight   Heart: RRR. Chest: clear bil.  No dyspnea or cough Abdomen: soft, ND, obese, active BS.  Minor non-focal tenderness in both lower quadrants.   Extremities: no CCE Neuro/Psych:  Oriented x 3.  No motor weakness.  Relaxed.   Intake/Output from previous day: 04/19 0701 - 04/20 0700 In: 3595.8 [I.V.:3208.3; Blood:387.5] Out: 750 [Urine:750]  Intake/Output this shift:    Lab Results:  Recent Labs  03/27/14 1809 03/28/14 0112 03/28/14 0600  WBC 11.8* 12.1* 11.2*  HGB 7.6* 7.9* 7.7*  HCT 22.4* 23.2* 22.7*  PLT 209 181 172   BMET  Recent Labs  03/26/14 0732 03/27/14 0345  NA 141 136*  K 4.3 4.1  CL 104 104  CO2 25 19  GLUCOSE 308* 335*  BUN 16 14  CREATININE 0.65 0.68  CALCIUM 8.4 7.4*   LFT  Recent Labs  03/26/14 0732 03/27/14 0345  PROT 6.2 5.1*  ALBUMIN 3.0* 2.5*  AST 13 12  ALT 16 13  ALKPHOS 84 62  BILITOT 0.3 0.2*    Studies/Results: Nm Gi Blood Loss 03/27/2014   FINDINGS: Following administration of technetium tagged red blood cells, static imaging was performed for 120 min. There is no evidence of abnormal accumulation of radiotracer within the small bowel. Radiotracer does slowly accumulating in the bladder which is expected. Incidentally, the patient moves her hands and arms in and out of the camera field creating some mild artifact. This does not significantly limit the  sensitivity of the examination.  IMPRESSION: Negative.   Electronically Signed   By: Malachy MoanHeath  McCullough M.D.   On: 03/27/2014 12:10   Ct Abdomen Pelvis W Contrast 03/27/2014   CLINICAL DATA:  r/o ischemic colitis with elevated lactate, bloody stool, abdominal pain on admission  EXAM: CT ABDOMEN AND PELVIS WITH CONTRAST  TECHNIQUE: Multidetector CT imaging of the abdomen and pelvis was performed using the standard protocol following bolus administration of intravenous contrast.  CONTRAST:  100mL OMNIPAQUE IOHEXOL 300 MG/ML  SOLN  COMPARISON:  Prior CT from 04/25/2010.  FINDINGS: Mild subsegmental atelectasis seen dependently within the lung bases.  The liver demonstrates a normal contrast enhanced appearance. Gallbladder is surgically absent. No biliary ductal dilatation. The spleen and adrenal glands are within normal limits. Mild fatty infiltration noted within the pancreas.  Kidneys are equal in size with symmetric enhancement. No nephrolithiasis, hydronephrosis, or focal enhancing renal mass.  No evidence of bowel obstruction. No abnormal wall thickening or inflammatory fat stranding seen about the bowels to suggest inflammation or ischemia. No portal venous gas. No pneumatosis. Scattered colonic diverticula present without acute diverticulitis. Appendix is normal.  Fat containing ventral hernia noted.  Bladder is within normal limits. Uterus and ovaries are not visualized.  No free air or fluid. No enlarged intra-abdominal or pelvic lymph nodes.  Normal intravascular enhancement seen throughout the intra-abdominal aorta and its branch vessels. The celiac axis, SMA, and IMA are opacified.  No acute osseous abnormality. No worrisome lytic or blastic osseous lesions. Normal limits.  IMPRESSION: 1. No CT evidence of ischemic colitis or other acute abdominopelvic process. 2. Colonic diverticulosis without acute diverticulitis. 3. Status post cholecystectomy. 4. Small fat containing ventral hernia.   Electronically  Signed   By: [name]  McClintock M.D.   On: 03/27/2014 05:02   Scheduled Meds: . DULoxetine  60 mg Oral Daily  . insulin aspart  0-15 Units Subcutaneous 6 times per day  . insulin glargine  10 Units Subcutaneous Q24H  . pantoprazole  40 mg Oral BID  . sodium chloride  3 mL Intravenous Q12H   Continuous Infusions: . sodium chloride 1,000 mL (03/27/14 2152)   PRN Meds:.ondansetron (ZOFRAN) IV, ondansetron   ASSESMENT:   *  Lower GI bleed.  Diverticular.    *  Chronic abdominal pain.  Epigastric pain better, lower abdominal/groin pain stable.   *  IDDM  *  Normocytic anemia. No recent baseline for comparison. Received 2 units so far. 3rd ordered.    *  Hyponatremia.    PLAN   *  Advance diet to carb mod. Bid CBC    Dianah FieldSarah J Gribbin  03/28/2014, 10:03 AM Pager: [phone]  GI ATTENDING  Interval history and data reviewed. Patient personally seen and examined. Agree with H&P as above. Significant painless lower GI bleeding felt to be secondary to diverticular source. Has not had bleeding in several days. Hemoglobin stable. Vital signs stable. Exam benign. Agree with advancing diet and increasing activity as tolerated. No further plans from GI standpoint. Has had relatively recent colonoscopy at as documented. At the patient has a more normal-appearing bowel movements, you could discharge her home. GI followup as needed. Will sign off.  Wilhemina BonitoJohn N. Eda KeysPerry, Jr., M.D. [facility] Division of Gastroenterology

## 2014-03-28 NOTE — Progress Notes (Signed)
    Day 2 of stay      Patient name: [name]  Medical record number: [id number]  Date of birth: [birth year]  68 year old very pleasant patient with GI bleed secondary to diverticulosis.  I met and evaluated her with my IM team on morning rounds.  She appears to be keeping stable hemoglobin and did not bleed yesterday.    Recent Labs Lab 03/27/14 0345 03/27/14 1233 03/27/14 1809 03/28/14 0112 03/28/14 0600  HGB 7.5* 8.0* 7.6* 7.9* 7.7*   Filed Vitals:   03/28/14 0734 03/28/14 1034 03/28/14 1039 03/28/14 1054  BP: 112/53 102/84  100/60  Pulse: 96 97 97 101  Temp: 99.4 F (37.4 C) 98.6 F (37 C)  99.6 F (37.6 C)  TempSrc: Oral Oral  Oral  Resp: _0 Height:      Weight:      SpO2: 98% 96% 95% 94%   Vitals have been stable.   Physical exam:   General: Resting in bed, no distress. Heart: RRR - no tachycardia, no rubs, murmurs or gallops. Lungs: Clear to auscultation bilaterally, no wheezes, rales, or rhonchi. Abdomen: Soft, Tender LLQ nondistended, BS present. Extremities: Warm, pedal edema 1+ Neuro: Alert and oriented X3,   I have reviewed the chart and medications of the patient. I have discussed this patient's management with my IM team. Basically plan is to observe for any further signs of bleeding, with supportive treatment. If the patient continues to bleed, then we will consider involving general surgery. GI already consulting. Please see the details of management from resident note from today.  Ortha Metts 03/28/2014, 11:49 AM.

## 2014-03-28 NOTE — Progress Notes (Signed)
Pt states she is feeling "short of breath". O2 sats 95-98% on room air and RR 25-28, lungs sounds the same as on initial assessment at 1935 this evening. Denies any chest pain or pain at all. Does have pitting edema to bilateral lower ext. MD Rodgers made aware, new orders received. Will NSL IVF per MD order and continue to monitor.

## 2014-03-28 NOTE — Clinical Documentation Improvement (Signed)
Presents with diverticular bleed; anemia documented in ED.   H&H has dropped from 9.9/30.8  to  7.5/22.9  Transfused PRBC's  Please clarify/specify type of anemia.   Acute Blood Loss Anemia Precipitous drop in Hematocrit Acute on chronic blood loss anemia Other Condition    Thank You, Shellee MiloEileen T Guillermo Nehring ,RN Clinical Documentation Specialist:  [phone]  [facility]- Health Information Management

## 2014-03-29 ENCOUNTER — Inpatient Hospital Stay (HOSPITAL_COMMUNITY): Payer: Medicare Other

## 2014-03-29 LAB — CBC
HCT: 24.6 % — ABNORMAL LOW (ref 36.0–46.0)
HCT: 25.7 % — ABNORMAL LOW (ref 36.0–46.0)
Hemoglobin: 8.2 g/dL — ABNORMAL LOW (ref 12.0–15.0)
Hemoglobin: 8.7 g/dL — ABNORMAL LOW (ref 12.0–15.0)
MCH: 28.1 pg (ref 26.0–34.0)
MCH: 28.6 pg (ref 26.0–34.0)
MCHC: 33.3 g/dL (ref 30.0–36.0)
MCHC: 33.9 g/dL (ref 30.0–36.0)
MCV: 84.2 fL (ref 78.0–100.0)
MCV: 84.5 fL (ref 78.0–100.0)
Platelets: 157 10*3/uL (ref 150–400)
Platelets: 175 10*3/uL (ref 150–400)
RBC: 2.92 MIL/uL — ABNORMAL LOW (ref 3.87–5.11)
RBC: 3.04 MIL/uL — ABNORMAL LOW (ref 3.87–5.11)
RDW: 15.1 % (ref 11.5–15.5)
RDW: 15 % (ref 11.5–15.5)
WBC: 8.6 10*3/uL (ref 4.0–10.5)
WBC: 8.6 10*3/uL (ref 4.0–10.5)

## 2014-03-29 LAB — TYPE AND SCREEN
ABO/RH(D): A POS
Antibody Screen: NEGATIVE
Unit division: 0
Unit division: 0
Unit division: 0

## 2014-03-29 LAB — GLUCOSE, CAPILLARY
Glucose-Capillary: 215 mg/dL — ABNORMAL HIGH (ref 70–99)
Glucose-Capillary: 197 mg/dL — ABNORMAL HIGH (ref 70–99)
Glucose-Capillary: 200 mg/dL — ABNORMAL HIGH (ref 70–99)
Glucose-Capillary: 216 mg/dL — ABNORMAL HIGH (ref 70–99)
Glucose-Capillary: 224 mg/dL — ABNORMAL HIGH (ref 70–99)
Glucose-Capillary: 217 mg/dL — ABNORMAL HIGH (ref 70–99)

## 2014-03-29 MED ORDER — INSULIN ASPART 100 UNIT/ML ~~LOC~~ SOLN
0.0000 [IU] | Freq: Three times a day (TID) | SUBCUTANEOUS | Status: DC
  Administered 2014-03-29 (×2): 5 [IU] via SUBCUTANEOUS
  Administered 2014-03-30: 3 [IU] via SUBCUTANEOUS
  Administered 2014-03-30 (×2): 5 [IU] via SUBCUTANEOUS
  Administered 2014-03-31: 3 [IU] via SUBCUTANEOUS

## 2014-03-29 NOTE — Progress Notes (Signed)
Received orders to transfer pt. Pt VS stable. Pt verbalizes understanding of transfer. Report called to [facility].

## 2014-03-29 NOTE — Progress Notes (Signed)
Inpatient Diabetes Program Recommendations  AACE/ADA: New Consensus Statement on Inpatient Glycemic Control (2013)  Target Ranges:  Prepandial:   less than 140 mg/dL      Peak postprandial:   less than 180 mg/dL (1-2 hours)      Critically ill patients:  140 - 180 mg/dL   Reason for Visit: Diabetes out of control  Diabetes history: Type 2 diabetes Outpatient Diabetes medications: Lantus 70 units, Novolog 15 units with breakfast and lunch, 20 units at supper, Glucophage [phone] bid Current orders for Inpatient glycemic control: Lantus 10 units, Novolog moderate correction tid   Results for [name], [name] (MRN [id number]) as of 03/29/2014 14:03  Ref. Range 03/28/2014 07:30 03/28/2014 11:20 03/28/2014 16:22 03/28/2014 19:41 03/28/2014 23:11 03/29/2014 04:09 03/29/2014 07:48 03/29/2014 11:31  Glucose-Capillary Latest Range: 70-99 mg/dL 409149 (H) 811144 (H) 914198 (H) 260 (H) 215 (H) 197 (H) 200 (H) 216 (H)   Note: Most CBG's above target.  Now has diet order again.   Request MD consider the following:  Increase hospital Lantus dose  Add meal coverage tid with meals in addition to correction scale  Talked with patient at bedside.  Discussed last A1C and changes MD made to meal coverage with this elevation.  Patient states that she thinks the increase in meal coverage did help some.  Has seen [name], RD, CDE in the Outpatient Clinic in the past regarding diabetes self-care; however, patient is [facility].  States it is about time to change doctors in the clinic and [facility] after change is made.  Also, needs to be sure she can clear her schedule to attend because her significant other is being treated for cancer and there is only one car-- he has had daily appointments at times.  Thank you.  Gizzelle Lacomb S. Elsie Lincolnouth, RN, MSN, CDE Inpatient Diabetes Program, team pager [phone]

## 2014-03-29 NOTE — Progress Notes (Addendum)
Subjective: Last bleeding episode 2 nights ago. Complaints of SOB overnight, but O2 sats on room air- 95-98%. Feels much better this morning, breathing on room air. Tolerating carb modified diet.  Objective: Vital signs in last 24 hours: Filed Vitals:   03/28/14 1938 03/28/14 2309 03/29/14 0407 03/29/14 0709  BP: 118/52 130/58 151/76 136/57  Pulse: 107 97 91 86  Temp: 97.9 F (36.6 C) 98.9 F (37.2 C) 98.6 F (37 C) 98.3 F (36.8 C)  TempSrc: Oral Oral Oral Oral  Resp: 17 20 20 19   Height:      Weight:      SpO2: 95% 93% 92% 95%   Weight change:   Intake/Output Summary (Last 24 hours) at 03/29/14 0858 Last data filed at 03/29/14 0200  Gross per 24 hour  Intake   2628 ml  Output    975 ml  Net   1653 ml   GENERAL- Pleasant, not in any distress.  HEENT- Atraumatic, normocephalic, PERRL, EOMI, oral mucosa appears moist.  CARDIAC- RRR, no murmurs, rubs or gallops.  RESP- Clear to auscultation bilaterally, no crackles.  ABDOMEN- Soft, mild tenderness in left lower quad, no palpable masses or organomegaly, bowel sounds present.  NEURO- No obvious Cr N abnormality, strenght equal and present in all extremities.  EXTREMITIES- pulse 2+, symmetric, no pedal edema. PSYCH- Normal mood and affect, appropriate thought content and speech.  Lab Results: Basic Metabolic Panel:  Recent Labs Lab 03/26/14 0732 03/27/14 0345  NA 141 136*  K 4.3 4.1  CL 104 104  CO2 25 19  GLUCOSE 308* 335*  BUN 16 14  CREATININE 0.65 0.68  CALCIUM 8.4 7.4*   Liver Function Tests:  Recent Labs Lab 03/26/14 0732 03/27/14 0345  AST 13 12  ALT 16 13  ALKPHOS 84 62  BILITOT 0.3 0.2*  PROT 6.2 5.1*  ALBUMIN 3.0* 2.5*    Recent Labs Lab 03/27/14 0345  AMYLASE 15   CBC:  Recent Labs Lab 03/26/14 0732  03/28/14 1800 03/29/14 0835  WBC 9.3  < > 9.4 8.6  NEUTROABS 6.6  --   --   --   HGB 10.6*  < > 8.4* 8.2*  HCT 32.2*  < > 24.3* 24.6*  MCV 82.6  < > 84.1 84.2  PLT 240  < >  144* 157  < > = values in this interval not displayed. Cardiac Enzymes:  Recent Labs Lab 03/26/14 0936  TROPONINI <0.30   CBG:  Recent Labs Lab 03/28/14 0730 03/28/14 1120 03/28/14 1622 03/28/14 1941 03/28/14 2311 03/29/14 0409  GLUCAP 149* 144* 198* 260* 215* 197*   Micro Results: Recent Results (from the past 240 hour(s))  MRSA PCR SCREENING     Status: None   Collection Time    03/26/14 11:57 AM      Result Value Ref Range Status   MRSA by PCR NEGATIVE  NEGATIVE Final   Comment:            The GeneXpert MRSA Assay (FDA     approved for NASAL specimens     only), is one component of a     comprehensive MRSA colonization     surveillance program. It is not     intended to diagnose MRSA     infection nor to guide or     monitor treatment for     MRSA infections.   Studies/Results: Ct Abdomen Pelvis W Contrast  03/27/2014   CLINICAL DATA:  r/o ischemic colitis with  elevated lactate, bloody stool, abdominal pain on admission   IMPRESSION: 1. No CT evidence of ischemic colitis or other acute abdominopelvic process. 2. Colonic diverticulosis without acute diverticulitis. 3. Status post cholecystectomy. 4. Small fat containing ventral hernia.   Electronically Signed   By: [name]  McClintock M.D.   On: 03/27/2014 05:02   Medications: I have reviewed the patient's current medications. Scheduled Meds: . DULoxetine  60 mg Oral Daily  . insulin aspart  0-15 Units Subcutaneous 6 times per day  . insulin glargine  10 Units Subcutaneous Q24H  . pantoprazole  40 mg Oral BID  . sodium chloride  3 mL Intravenous Q12H   Continuous Infusions:   PRN Meds:.acetaminophen, ondansetron (ZOFRAN) IV, ondansetron Assessment/Plan:  Rectal bleed- Most likely due diverticular bleed- Considering description of painless, bright red bleed. Colonoscopy 07/01/2012- Which showed- Severe Diverticulosis in the Left Colon with internal hemorrhoids. Last bleeding episode over 48hrs ago. Red blood  cell tag was negative. Ct abdomen- 03/27/2014- negative for diverticulitis and ischemic colitis. Hgb After 2 units- 7.7.  Pt has received 3 units of blood so far. CbC this am- 8.2. SOB likely due to fluid overload, with Blood transfusions and IVFs. IVFs d/c as patient is taking oral diet now. Chest xray not suggestive of congestion. Pt making urine, will observe for now. No SOB this am. Vitals stable.  - Monitor CBCs- Q12H, and transfuse, goal Hgb>7.5. - GI consulted, recs appreciated, has signed off. - D/c home when pt has had a normal bowel movement. No bowel movement since last GI bleed. - Monitor In and out  - Tolerating oral diet. - General surgery consult if rectal bleed causes hemodynamic instabilty. - Transfer off step down to tele.  Acute blood loss anemia- Documented Hgb- 2011- 15.4. On admission- 10.6. Likley due to Diverticulosis. hgb this am- 8.2, Appears stable. - Follow CBCs Q6H and transfuse, goal- >7.5.   DM Uncontrolled- Last HgbA1c- 03/01/2014- 9.3. Home meds- Metformin- 500-1000mg  BID depending on the size of the meal she is having. Novolog- 15u- break fast and lunch,. 20u at night, Lantus- 70 u at bedtime.  - SSI  - Lantus 10u for now.   HTN- Blood pressure stable. HCTZ- Lisinpril- 20-25mg  Daily.  - Hold blood pressure meds for now, in the setting of GI bleed  Dispo: Disposition is deferred at this time, awaiting improvement of current medical problems.   The patient does have a current PCP [name]([name], MD) and [facility].  The patient does have transportation limitations that hinder transportation to clinic appointments.  .Services Needed at time of discharge: Y = Yes, Blank = No PT:   OT:   RN:   Equipment:   Other:     LOS: 3 days   Kennis CarinaEjiroghene Cadell Gabrielson, MD 03/29/2014, 8:58 AM

## 2014-03-30 LAB — CBC
HCT: 26.6 % — ABNORMAL LOW (ref 36.0–46.0)
HCT: 25.2 % — ABNORMAL LOW (ref 36.0–46.0)
Hemoglobin: 8.9 g/dL — ABNORMAL LOW (ref 12.0–15.0)
Hemoglobin: 8.4 g/dL — ABNORMAL LOW (ref 12.0–15.0)
MCH: 28.2 pg (ref 26.0–34.0)
MCH: 28.2 pg (ref 26.0–34.0)
MCHC: 33.5 g/dL (ref 30.0–36.0)
MCHC: 33.3 g/dL (ref 30.0–36.0)
MCV: 84.2 fL (ref 78.0–100.0)
MCV: 84.6 fL (ref 78.0–100.0)
Platelets: 213 10*3/uL (ref 150–400)
Platelets: 210 10*3/uL (ref 150–400)
RBC: 3.16 MIL/uL — ABNORMAL LOW (ref 3.87–5.11)
RBC: 2.98 MIL/uL — ABNORMAL LOW (ref 3.87–5.11)
RDW: 15.1 % (ref 11.5–15.5)
RDW: 15.1 % (ref 11.5–15.5)
WBC: 8.6 10*3/uL (ref 4.0–10.5)
WBC: 8.4 10*3/uL (ref 4.0–10.5)

## 2014-03-30 LAB — GLUCOSE, CAPILLARY
Glucose-Capillary: 200 mg/dL — ABNORMAL HIGH (ref 70–99)
Glucose-Capillary: 219 mg/dL — ABNORMAL HIGH (ref 70–99)
Glucose-Capillary: 218 mg/dL — ABNORMAL HIGH (ref 70–99)
Glucose-Capillary: 207 mg/dL — ABNORMAL HIGH (ref 70–99)

## 2014-03-30 MED ORDER — INSULIN GLARGINE 100 UNIT/ML ~~LOC~~ SOLN
20.0000 [IU] | SUBCUTANEOUS | Status: DC
  Administered 2014-03-30: 20 [IU] via SUBCUTANEOUS
  Filled 2014-03-30 (×3): qty 0.2

## 2014-03-30 NOTE — Progress Notes (Signed)
Subjective: Pt has had 2 bowel movements today. 1st episode- Has some blood- pt described traces of blood with few clots. Blood dark. Much more stool than blood. 2nd episode- was seen by pts nurse, was, described very small blood in stool. No dizziness.   Objective: Vital signs in last 24 hours: Filed Vitals:   03/29/14 1130 03/29/14 1132 03/29/14 1404 03/30/14 0542  BP: 124/93  132/48 134/68  Pulse: 89  97 91  Temp:  98.2 F (36.8 C) 98.2 F (36.8 C) 98.1 F (36.7 C)  TempSrc:  Oral Oral Oral  Resp: 23  18   Height:   5\' 5"  (1.651 m)   Weight:   220 lb (99.791 kg) 230 lb 9.6 oz (104.599 kg)  SpO2: 98%  98% 93%   Weight change:   Intake/Output Summary (Last 24 hours) at 03/30/14 1147 Last data filed at 03/30/14 0900  Gross per 24 hour  Intake    480 ml  Output    200 ml  Net    280 ml   GENERAL- Pleasant lady, NAD.  HEENT- Atraumatic, normocephalic, PERRL, EOMI, oral mucosa appears moist.  CARDIAC- Regular rate and rhythm. No added sounds.  RESP- No added sound on auscultation.  ABDOMEN- Soft, minimal tenderness in left lower quad, bowel sounds present.  NEURO- No obvious Cr N abnormality, strenght equal and present in all extremities.  EXTREMITIES- pulse 2+, symmetric, no pedal edema. PSYCH- Normal mood and affect, appropriate thought content and speech.  Lab Results: Basic Metabolic Panel:  Recent Labs Lab 03/26/14 0732 03/27/14 0345  NA 141 136*  K 4.3 4.1  CL 104 104  CO2 25 19  GLUCOSE 308* 335*  BUN 16 14  CREATININE 0.65 0.68  CALCIUM 8.4 7.4*   Liver Function Tests:  Recent Labs Lab 03/26/14 0732 03/27/14 0345  AST 13 12  ALT 16 13  ALKPHOS 84 62  BILITOT 0.3 0.2*  PROT 6.2 5.1*  ALBUMIN 3.0* 2.5*    Recent Labs Lab 03/27/14 0345  AMYLASE 15   CBC:  Recent Labs Lab 03/26/14 0732  03/29/14 1818 03/30/14 0930  WBC 9.3  < > 8.6 8.6  NEUTROABS 6.6  --   --   --   HGB 10.6*  < > 8.7* 8.9*  HCT 32.2*  < > 25.7* 26.6*  MCV 82.6   < > 84.5 84.2  PLT 240  < > 175 213  < > = values in this interval not displayed. Cardiac Enzymes:  Recent Labs Lab 03/26/14 0936  TROPONINI <0.30   CBG:  Recent Labs Lab 03/29/14 0748 03/29/14 1131 03/29/14 1651 03/29/14 2111 03/30/14 0729 03/30/14 1142  GLUCAP 200* 216* 224* 217* 200* 219*   Micro Results: Recent Results (from the past 240 hour(s))  MRSA PCR SCREENING     Status: None   Collection Time    03/26/14 11:57 AM      Result Value Ref Range Status   MRSA by PCR NEGATIVE  NEGATIVE Final   Comment:            The GeneXpert MRSA Assay (FDA     approved for NASAL specimens     only), is one component of a     comprehensive MRSA colonization     surveillance program. It is not     intended to diagnose MRSA     infection nor to guide or     monitor treatment for     MRSA infections.  Studies/Results: Ct Abdomen Pelvis W Contrast  03/27/2014   CLINICAL DATA:  r/o ischemic colitis with elevated lactate, bloody stool, abdominal pain on admission   IMPRESSION: 1. No CT evidence of ischemic colitis or other acute abdominopelvic process. 2. Colonic diverticulosis without acute diverticulitis. 3. Status post cholecystectomy. 4. Small fat containing ventral hernia.   Electronically Signed   By: [name]  McClintock M.D.   On: 03/27/2014 05:02   Medications: I have reviewed the patient's current medications. Scheduled Meds: . DULoxetine  60 mg Oral Daily  . insulin aspart  0-15 Units Subcutaneous TID WC  . insulin glargine  20 Units Subcutaneous Q24H  . pantoprazole  40 mg Oral BID  . sodium chloride  3 mL Intravenous Q12H   Continuous Infusions:   PRN Meds:.acetaminophen, ondansetron (ZOFRAN) IV, ondansetron Assessment/Plan:  Rectal bleed- Most likely due diverticular bleed. Colonoscopy 07/01/2012- Which showed- Severe Diverticulosis in the Left Colon with internal hemorrhoids. Last bleeding episode over 3 days ago. Red blood cell tag was negative. Ct abdomen-  03/27/2014- negative for diverticulitis and ischemic colitis.  - CBC this am- stable at 8.9, repeat CBC today at 1pm - Monitor CBCs- Q12H, and transfuse, goal Hgb< 7.5. - GI consulted, recs appreciated, has signed off, Follow up as an outpt, appointment set up. - Plan- D/c home when pt has had a normal bowel movement. Pt has had 2 bowel movement with traces of blood. - Monitor In and out  - Tolerating oral diet. - General surgery consult if rectal bleed causes hemodynamic instabilty. - Transfer off step down to tele.  Acute blood loss anemia- Documented Hgb- 2011- 15.4. On admission- 10.6. Likley due to Diverticulosis. hgb this am- 8.2, Appears stable. - Follow CBCs Q6H and transfuse, goal- >7.5.   DM Uncontrolled- Last HgbA1c- 03/01/2014- 9.3. Home meds- Metformin- 500-1000mg  BID depending on the size of the meal she is having. Novolog- 15u- break fast and lunch,. 20u at night, Lantus- 70 u at bedtime.  - SSI  - Lantus 10u for now.   HTN- Blood pressure stable. HCTZ- Lisinpril- 20-25mg  Daily.  - Hold blood pressure meds for now, in the setting of GI bleed  Dispo: Disposition is deferred at this time, awaiting improvement of current medical problems.   The patient does have a current PCP [name]([name], MD) and [facility] after discharge.  The patient does have transportation limitations that hinder transportation to clinic appointments.  .Services Needed at time of discharge: Y = Yes, Blank = No PT:   OT:   RN:   Equipment:   Other:     LOS: 4 days   Kennis CarinaEjiroghene Drayven Marchena, MD 03/30/2014, 11:47 AM

## 2014-03-30 NOTE — Progress Notes (Signed)
Pt had BM this AM. NT flushed it before RN could see it. Pt states that there was more stool than blood, states that the blood was not bright red,but more of a maroon and that there were clots; MD made aware

## 2014-03-30 NOTE — Progress Notes (Signed)
    Day 4 of stay      Patient name: [name]  Medical record number: [id number]  Date of birth: 07-Jul-1946  Met with her this am. Eating well. Doing much better today however 2 BMs this morning had streaks of blood in it, the patient cannot quantify how much and the nurse's note mentions small pools of blood in stool. The patient does not feel dizzy or lightheaded. She says that the bleeding is much less than before.    Recent Labs Lab 03/28/14 0600 03/28/14 1800 03/29/14 0835 03/29/14 1818 03/30/14 0930  HGB 7.7* 8.4* 8.2* 8.7* 8.9*   The patient has kept stable hgb since the past 2 days after PRBC transfusions. We will monitor her overnight and reassess her for discharge tomorrow.   The rest of the management according to resident note. I have discussed the plan of care with my team.      Madilyn Fireman 03/30/2014, 12:00 PM.

## 2014-03-30 NOTE — Progress Notes (Signed)
Pt with another small BM; Rn able to see this one. Very small spots of blood in stool. Per pt, this BM has less blood in this stool

## 2014-03-31 ENCOUNTER — Encounter: Payer: Self-pay | Admitting: *Deleted

## 2014-03-31 LAB — CBC
HCT: 25.4 % — ABNORMAL LOW (ref 36.0–46.0)
Hemoglobin: 8.4 g/dL — ABNORMAL LOW (ref 12.0–15.0)
MCH: 27.9 pg (ref 26.0–34.0)
MCHC: 33.1 g/dL (ref 30.0–36.0)
MCV: 84.4 fL (ref 78.0–100.0)
Platelets: 233 10*3/uL (ref 150–400)
RBC: 3.01 MIL/uL — ABNORMAL LOW (ref 3.87–5.11)
RDW: 15.2 % (ref 11.5–15.5)
WBC: 7.9 10*3/uL (ref 4.0–10.5)

## 2014-03-31 LAB — GLUCOSE, CAPILLARY: Glucose-Capillary: 187 mg/dL — ABNORMAL HIGH (ref 70–99)

## 2014-03-31 NOTE — Progress Notes (Signed)
Subjective: Doing much better this morning. Eager to go home . Had normal bowel movement this morning, with no blood. No events overnight.  Objective: Vital signs in last 24 hours: Filed Vitals:   03/30/14 0542 03/30/14 1400 03/30/14 2050 03/31/14 0500  BP: 134/68 131/57 143/63 135/68  Pulse: 91 88 93 92  Temp: 98.1 F (36.7 C) 98.2 F (36.8 C) 99.3 F (37.4 C) 98.8 F (37.1 C)  TempSrc: Oral Oral Oral Oral  Resp:  18 18 18   Height:      Weight: 230 lb 9.6 oz (104.599 kg)     SpO2: 93% 94% 97% 93%   Weight change:   Intake/Output Summary (Last 24 hours) at 03/31/14 1047 Last data filed at 03/30/14 2100  Gross per 24 hour  Intake      3 ml  Output      0 ml  Net      3 ml   GENERAL- alert, co-operative, appears as stated age, not in any distress. HEENT- Atraumatic, normocephalic, PERRL, EOMI, no cervical LN enlargement. CARDIAC- RRR, no murmurs, rubs or gallops. RESP- Moving equal volumes of air, and clear to auscultation bilaterally. ABDOMEN- Soft,non tender. NEURO- Alert and oriented. EXTREMITIES- No pedal edema.Marland Kitchen. SKIN- Warm, dry, No rash or lesion.  Lab Results: Basic Metabolic Panel:  Recent Labs Lab 03/26/14 0732 03/27/14 0345  NA 141 136*  K 4.3 4.1  CL 104 104  CO2 25 19  GLUCOSE 308* 335*  BUN 16 14  CREATININE 0.65 0.68  CALCIUM 8.4 7.4*   Liver Function Tests:  Recent Labs Lab 03/26/14 0732 03/27/14 0345  AST 13 12  ALT 16 13  ALKPHOS 84 62  BILITOT 0.3 0.2*  PROT 6.2 5.1*  ALBUMIN 3.0* 2.5*    Recent Labs Lab 03/27/14 0345  AMYLASE 15    Recent Labs Lab 03/26/14 0732  03/30/14 1500 03/31/14 0518  WBC 9.3  < > 8.4 7.9  NEUTROABS 6.6  --   --   --   HGB 10.6*  < > 8.4* 8.4*  HCT 32.2*  < > 25.2* 25.4*  MCV 82.6  < > 84.6 84.4  PLT 240  < > 210 233  < > = values in this interval not displayed. Cardiac Enzymes:  Recent Labs Lab 03/26/14 0936  TROPONINI <0.30   CBG:  Recent Labs Lab 03/29/14 2111 03/30/14 0729  03/30/14 1142 03/30/14 1640 03/30/14 2054 03/31/14 0740  GLUCAP 217* 200* 219* 218* 207* 187*   Medications: I have reviewed the patient's current medications. Scheduled Meds: . DULoxetine  60 mg Oral Daily  . insulin aspart  0-15 Units Subcutaneous TID WC  . insulin glargine  20 Units Subcutaneous Q24H  . pantoprazole  40 mg Oral BID  . sodium chloride  3 mL Intravenous Q12H   Continuous Infusions:  PRN Meds:.acetaminophen, ondansetron (ZOFRAN) IV, ondansetron Assessment/Plan:  Rectal bleed- Most likely due diverticular bleed. Colonoscopy 07/01/2012- Severe Diverticulosis in the Left Colon with internal hemorrhoids. Last bleeding episode over 4 days ago. Red blood cell tag- negative. Ct abdomen- 03/27/2014- negative for diverticulitis and ischemic colitis.  - CBC this am- stable at 8.4. - GI consulted, recs appreciated, has signed off, Follow up as an outpt, appointment set up. - Discharge home. - Follow up appointment set up as an outpt.   Acute blood loss anemia- Documented Hgb- 2011- 15.4. On admission- 10.6. Likley due to Diverticulosis. hgb this am- 8.4, stable.  - Discharge home.  DM Uncontrolled- Last  HgbA1c- 03/01/2014- 9.3. Home meds- Metformin- 500-1000mg  BID depending on the size of the meal she is having. Novolog- 15u- break fast and lunch,. 20u at night, Lantus- 70 u at bedtime.  - SSI on admission. - Discharge home on prior insulin regimen.  HTN- Blood pressure stable. HCTZ- Lisinpril- 20-25mg  Daily.  - Hold blood pressure meds for now, in the setting of GI bleed  Dispo: Disposition is deferred at this time, awaiting improvement of current medical problems.  Anticipated discharge in today(s).   The patient does have a current PCP [name]([name], MD) and [facility] after discharge.  The patient does not have transportation limitations that hinder transportation to clinic appointments.  .Services Needed at time of discharge:  Y = Yes, Blank = No PT:   OT:   RN:   Equipment:   Other:     LOS: 5 days   Kennis CarinaEjiroghene Raelie Lohr, MD 03/31/2014, 10:47 AM

## 2014-03-31 NOTE — Discharge Summary (Signed)
Name: [name] MRN: [id number] DOB: [birth year] 68 y.o. PCP: [name], MD  Date of Admission: 03/26/2014  6:46 AM Date of Discharge: 03/31/2014 Attending Physician: No att. providers found  Discharge Diagnosis:  Principal Problem:   Acute blood loss anemia Active Problems:   Type II diabetes mellitus with neurological manifestations, uncontrolled   HYPERTENSION   GERD   GI bleed   Diverticulosis of colon  Discharge Medications:   Medication List         ASPERCREME EX  Apply 1 application topically daily as needed (shoulder/hips/knee/knuckles).     DULoxetine 60 MG capsule  Commonly known as:  CYMBALTA  Take 1 capsule (60 mg total) by mouth daily.     esomeprazole 40 MG capsule  Commonly known as:  NEXIUM  Take 40 mg by mouth daily before breakfast.     gabapentin 300 MG capsule  Commonly known as:  NEURONTIN  Take 300 mg by mouth 3 (three) times daily.     insulin aspart 100 UNIT/ML injection  Commonly known as:  NOVOLOG  Take 15 units with breakfast and lunch, 20 units with night time meal.     insulin glargine 100 UNIT/ML injection  Commonly known as:  LANTUS  Inject 70 Units into the skin at bedtime.     lisinopril-hydrochlorothiazide 20-25 MG per tablet  Commonly known as:  PRINZIDE,ZESTORETIC  Take 1 tablet by mouth daily.     metFORMIN 1000 MG tablet  Commonly known as:  GLUCOPHAGE  Take 500-1,000 mg by mouth 2 (two) times daily with a meal. Pt takes either a whole or a half tablet depending on the size of the meal she is having     NASACORT AQ NA  Place 1 spray into both nostrils daily as needed (allergies).     pravastatin 80 MG tablet  Commonly known as:  PRAVACHOL  Take 80 mg by mouth every evening. Pt prefers to take with a meal        Disposition and follow-up:   Ms.[name] was discharged from [facility] in Good condition.  At the hospital follow up visit please address:  1.  Any  bloody bowel movement since discharge? Did patient keep Gi appointment? Blood pressure meds were held on admission due to blood loss, do they need to be restarted?  2.  Labs / imaging needed at time of follow-up: CBC- If Hgb is stable and trending up to pts baseline.  3.  Pending labs/ test needing follow-up: None  Follow-up Appointments:     Follow-up Information   Follow up with Genella Mech, MD On 04/06/2014. (@2 :45pm Hospital follow up)    Specialty:  Internal Medicine   Contact information:   [address] [place] [address] [phone]       Follow up with Theda Belfast, MD On 04/04/2014. (@9 :30am Hospital Follow up w/PA)    Specialty:  Gastroenterology   Contact information:   [address] [place] [address] [phone]       Discharge Instructions: Discharge Orders   Future Appointments Provider Department Dept Phone   04/04/2014 9:30 AM Shanda Bumps D. Rondall [facility] Gastroenterology [phone]   04/06/2014 2:45 PM Ky Barban, MD [facility] [phone]   Future Orders Complete By Expires   Call MD for:  persistant dizziness or light-headedness  As directed    Diet - low sodium heart healthy  As directed    Discharge  instructions  As directed    Increase activity slowly  As directed       Consultations:  Gastroenterology  Procedures Performed:  Dg Chest 2 View  03/29/2014   CLINICAL DATA:  Shortness of breath.  EXAM: CHEST  2 VIEW  COMPARISON:  DG BONE DENSITY dated 06/22/2010; DG CHEST 2 VIEW dated 09/17/2005  FINDINGS: Stable cardiac and mediastinal contours. Mild central bronchial wall thickening. No large consolidative pulmonary opacities. No pleural effusion or pneumothorax. Regional skeleton is unremarkable.  IMPRESSION: Mild central bronchial wall thickening. No large consolidative pulmonary opacity.   Electronically Signed   By: Annia Beltrew  Davis M.D.   On: 03/29/2014 07:36   Nm Gi Blood  Loss  03/27/2014   CLINICAL DATA:  68 year old female with persistent lower GI bleed. Evaluate for positive bleeding  EXAM: NUCLEAR MEDICINE GASTROINTESTINAL BLEEDING SCAN  TECHNIQUE: Sequential abdominal images were obtained following intravenous administration of Tc-3826m labeled red blood cells.  RADIOPHARMACEUTICALS:  25MILLI CURIE ULTRATAG TECHNETIUM TC 53M-LABELED RED BLOOD CELLS IV KITmCi Tc-5526m in-vitro labeled red cells.  COMPARISON:  CT abdomen/ pelvis 03/27/2014  FINDINGS: Following administration of technetium tagged red blood cells, static imaging was performed for 120 min. There is no evidence of abnormal accumulation of radiotracer within the small bowel. Radiotracer does slowly accumulating in the bladder which is expected. Incidentally, the patient moves her hands and arms in and out of the camera field creating some mild artifact. This does not significantly limit the sensitivity of the examination.  IMPRESSION: Negative.   Electronically Signed   By: Malachy MoanHeath  McCullough M.D.   On: 03/27/2014 12:10   Ct Abdomen Pelvis W Contrast  03/27/2014   CLINICAL DATA:  r/o ischemic colitis with elevated lactate, bloody stool, abdominal pain on admission  EXAM: CT ABDOMEN AND PELVIS WITH CONTRAST  TECHNIQUE: Multidetector CT imaging of the abdomen and pelvis was performed using the standard protocol following bolus administration of intravenous contrast.  CONTRAST:  100mL OMNIPAQUE IOHEXOL 300 MG/ML  SOLN  COMPARISON:  Prior CT from 04/25/2010.  FINDINGS: Mild subsegmental atelectasis seen dependently within the lung bases.  The liver demonstrates a normal contrast enhanced appearance. Gallbladder is surgically absent. No biliary ductal dilatation. The spleen and adrenal glands are within normal limits. Mild fatty infiltration noted within the pancreas.  Kidneys are equal in size with symmetric enhancement. No nephrolithiasis, hydronephrosis, or focal enhancing renal mass.  No evidence of bowel obstruction. No  abnormal wall thickening or inflammatory fat stranding seen about the bowels to suggest inflammation or ischemia. No portal venous gas. No pneumatosis. Scattered colonic diverticula present without acute diverticulitis. Appendix is normal.  Fat containing ventral hernia noted.  Bladder is within normal limits. Uterus and ovaries are not visualized.  No free air or fluid. No enlarged intra-abdominal or pelvic lymph nodes. Normal intravascular enhancement seen throughout the intra-abdominal aorta and its branch vessels. The celiac axis, SMA, and IMA are opacified.  No acute osseous abnormality. No worrisome lytic or blastic osseous lesions. Normal limits.  IMPRESSION: 1. No CT evidence of ischemic colitis or other acute abdominopelvic process. 2. Colonic diverticulosis without acute diverticulitis. 3. Status post cholecystectomy. 4. Small fat containing ventral hernia.   Electronically Signed   By: [name]  McClintock M.D.   On: 03/27/2014 05:02    2D Echo: None  Cardiac Cath: None  Admission HPI: Chief Complaint: Rectal Bleed  History of Present Illness: 89 y o F with PMH of IBS, HLD, DM2, HTN, presented with c/o of Rectal bleed  that started at about 4 am two nights ago, she woke up from sleep with an urgency to use the bathroom, and then blood and some stool came out, described the blood as really bright red and gushing out, she had several episodes of this, and finally remitted yesterday morning, but restarted at about 6pm later yesterday, had several episodes and hoped it would stop like it did earlier. She started feeling dizzy and sweaty at about 5am this morning, and so she [facility] the [facility]. No prior similar episodes, no blood in stools, or streaks of blood when she wipes, no dark stools, no vomiting of blood, No fever or chills, no weight loss. Pt endorses left lower quadrant pain, which she describes as a stitch in her side, and mostly comes on with change in  position, like when she twists around or bends down at the waist. Also says she mostly has diarrhea, no constipation, but has hemorrhoids  Physical Exam:  Blood pressure 135/58, pulse 83, temperature 97.4 F (36.3 C), temperature source Oral, resp. rate 18, height 5\' 5"  (1.651 m), weight 220 lb (99.791 kg), SpO2 98.00%.  GENERAL- Pleasant, alert, co-operative, appears as stated age, not in any distress.  HEENT- Atraumatic, normocephalic, PERRL, EOMI, oral mucosa appears mildly dry, no cervical LN enlargement, thyroid does not appear enlarged.  CARDIAC- RRR, no murmurs, rubs or gallops.  RESP- Moving equal volumes of air, and clear to auscultation bilaterally.  ABDOMEN- Soft,non tender,no guarding, no rebound, no palpable masses or organomegaly, bowel sounds present. Rectal exam- Some blood around the anus.  NEURO- No obvious Cr N abnormality, strenght equal and present in all extremities.  EXTREMITIES- pulse 2+, symmetric, no pedal edema.  SKIN- Warm, dry, Patchy- ~3cm circular lesions on her back- appears stuck on.  PSYCH- Normal mood and affect, appropriate thought content and speech.   Hospital Course by problem list:   1) Lower Gi Bleed/Diverticular Bleed- Pt presented with painless rectal bleeding (BRBPR) most likely due to Diverticular bleed, as seen on prior Colonoscopy- 07/01/2012. CBC with Hgb- 10.6, down from 15.4 Three years ago. Pt was admitted to step down, and Gastroenterology was consulted, and recommendation was for supportive management with IVF and Blood transfusions as indicated. During this admission, pt had another episode of BRBPR, with loss of up to 1L of blood with clots. Hgb dropped from 10 on admission to 7.5. Pt required a total of 3 units of blood throughout admission. A tag red blood cell scan was also done which failed to show the source of bleed. With complaints of abdominal pain and development of a mild leukocytosis of 10.8, with increase in lactic acid of 2.8, pt  had abdominal/pelvic Ct to rule out Diverticulitis/ischemic colitis, which showed diverticulosis without evidence of diverticulitis or colitis. Leukocytosis and lactic acidosis resolved on repeat. Vitals largely remained stable. ICU provider did not believe that pt meets criteria for ICU transfer. Serial CBC were done, and on discharge was stable at 8.4-8.9, with no symptoms. Pt had 3 bowel movement prior to discharge, the first two had traces of blood,  the last of which was reported to have no blood at all. Pt was therefore discharged home with close follow up in clinic, and to report to the ED if pt had recurrence of rectal bleed.  2) DM Uncontrolled- Last HgbA1c- 03/01/2014- 9.3. Home meds- Metformin- 500-1000mg  BID depending on the size of the meal she is having. Novolog- 15u- break fast and lunch,.  20u at night, Lantus- 70 u at bedtime. During this admission she was placed on SSI and  Lantus was reduced to 10 units sq daily, we opted to allow some permissible hyperglycemia, rather than risk life threatening hypoglycemia as the patient was NPO due to Gi bleed.   3)H/o HTN- Lisinopril/HCTZ - 20-25mg  daily was discontinued due to concerns about hemodynamic instability in a patient with Diverticular/GI bleed requiring transfusion of PRBC. Blood pressure on discharge were low normal to WNL. Consider restarting on clinic follow up.   Discharge Vitals:   BP 135/68  Pulse 92  Temp(Src) 98.8 F (37.1 C) (Oral)  Resp 18  Ht 5\' 5"  (1.651 m)  Wt 230 lb 9.6 oz (104.599 kg)  BMI 38.37 kg/m2  SpO2 93%  Discharge Labs:  Results for orders placed during the hospital encounter of 03/26/14 (from the past 24 hour(s))  CBC     Status: Abnormal   Collection Time    03/30/14  3:00 PM      Result Value Ref Range   WBC 8.4  4.0 - 10.5 K/uL   RBC 2.98 (*) 3.87 - 5.11 MIL/uL   Hemoglobin 8.4 (*) 12.0 - 15.0 g/dL   HCT 16.1 (*) 09.6 - 04.5 %   MCV 84.6  78.0 - 100.0 fL   MCH 28.2  26.0 - 34.0 pg   MCHC 33.3   30.0 - 36.0 g/dL   RDW 40.9  81.1 - 91.4 %   Platelets 210  150 - 400 K/uL  GLUCOSE, CAPILLARY     Status: Abnormal   Collection Time    03/30/14  4:40 PM      Result Value Ref Range   Glucose-Capillary 218 (*) 70 - 99 mg/dL  GLUCOSE, CAPILLARY     Status: Abnormal   Collection Time    03/30/14  8:54 PM      Result Value Ref Range   Glucose-Capillary 207 (*) 70 - 99 mg/dL   Comment 1 Notify RN    CBC     Status: Abnormal   Collection Time    03/31/14  5:18 AM      Result Value Ref Range   WBC 7.9  4.0 - 10.5 K/uL   RBC 3.01 (*) 3.87 - 5.11 MIL/uL   Hemoglobin 8.4 (*) 12.0 - 15.0 g/dL   HCT 78.2 (*) 95.6 - 21.3 %   MCV 84.4  78.0 - 100.0 fL   MCH 27.9  26.0 - 34.0 pg   MCHC 33.1  30.0 - 36.0 g/dL   RDW 08.6  57.8 - 46.9 %   Platelets 233  150 - 400 K/uL  GLUCOSE, CAPILLARY     Status: Abnormal   Collection Time    03/31/14  7:40 AM      Result Value Ref Range   Glucose-Capillary 187 (*) 70 - 99 mg/dL   Comment 1 Documented in Chart     Comment 2 Notify RN      Signed: Kennis Carina, MD 03/31/2014, 11:52 AM   Time Spent on Discharge: 40 minutes Services Ordered on Discharge: None Equipment Ordered on Discharge: None

## 2014-04-04 ENCOUNTER — Ambulatory Visit (INDEPENDENT_AMBULATORY_CARE_PROVIDER_SITE_OTHER): Payer: Medicare Other | Admitting: Gastroenterology

## 2014-04-04 ENCOUNTER — Telehealth: Payer: Self-pay | Admitting: Dietician

## 2014-04-04 ENCOUNTER — Encounter: Payer: Self-pay | Admitting: Gastroenterology

## 2014-04-04 ENCOUNTER — Ambulatory Visit: Payer: Medicare Other | Admitting: Gastroenterology

## 2014-04-04 VITALS — BP 160/80 | HR 100 | Ht 65.0 in | Wt 223.0 lb

## 2014-04-04 DIAGNOSIS — D62 Acute posthemorrhagic anemia: Secondary | ICD-10-CM

## 2014-04-04 DIAGNOSIS — K5731 Diverticulosis of large intestine without perforation or abscess with bleeding: Secondary | ICD-10-CM | POA: Insufficient documentation

## 2014-04-04 NOTE — Patient Instructions (Signed)
Please follow up as needed 

## 2014-04-04 NOTE — Progress Notes (Signed)
04/04/2014 [name] [id number] January 16, 1946   History of Present Illness:  This is a pleasant 68 year old female who is known to Dr. Leone PayorGessner for previous EGD and colonoscopy in 06/2012.  At that time colonoscopy revealed severe diverticulosis in the left colon and internal hemorrhoids. EGD at the same time was normal. She was recently hospitalized last week from April 18 to April 23 for lower GI bleed.  GI was consulted and it was suspected that this was a diverticular bleed. She had a CT scan of the abdomen and pelvis performed, which did not reveal any acute findings including diverticulitis or ischemic colitis. She also had a nuclear medicine bleeding scan, which was negative. She did require transfusion with 3 units of packed red blood cells and her hemoglobin at discharge was 8.4 grams.  She is here today for followup. She says the bleeding has resolved with no further issues since discharge. Her only complaint today is what she believes is fluid overload with some shortness of breath and swelling in her lower extremities. She says that over the last day or so her symptoms have improved, however, and she has lost several pounds of water weight. She says that they took her off of her fluid pills in the hospital, but she has been drinking coffee, which is making her urinate frequently. She has followup with her PCP in 2 days. Her oxygen sats are 98% here in our office today.   Current Medications, Allergies, Past Medical History, Past Surgical History, Family History and Social History were reviewed in [facility] electronic medical record.   Physical Exam: BP 160/80  Pulse 100  Ht 5\' 5"  (1.651 m)  Wt 223 lb (101.152 kg)  BMI 37.11 kg/m2  SpO2 98% General: Well developed white female in no acute distress Head: Normocephalic and atraumatic Eyes:  Sclerae anicteric, conjunctiva pink  Ears: Normal auditory acuity Lungs: Clear throughout to auscultation Heart:  Slightly tachy but  regular rhythm Abdomen: Soft, non-distended.  Normal bowel sounds.  Non-tender. Musculoskeletal: Symmetrical with no gross deformities  Extremities:  Significant pitting edema in B/L LE's R>L Neurological: Alert oriented x 4, grossly non-focal Psychological:  Alert and cooperative. Normal mood and affect  Assessment and Recommendations: -Diverticular bleed:  Resolved.   -Acute blood loss anemia:  Secondary to above.  Requiring 3 units of PRBC's on admission.  Now stable.  She will have her PCP recheck CBC when she sees them on Wednesday since they will likely need other labs as well.  Follow-up as needed.

## 2014-04-04 NOTE — Progress Notes (Signed)
Agree w/ Ms. Zehr's management. 

## 2014-04-04 NOTE — Telephone Encounter (Signed)
Discharge date:03-31-14 Call date: 04-04-14 Hospital follow up appointment date: 04-06-14 at 2:45 PM with Dr. Mcarthur RossettiKeneerly  Calling to assist with transition of care from hospital to home. Left message with hospital follow up appointment day and time.

## 2014-04-06 ENCOUNTER — Ambulatory Visit (INDEPENDENT_AMBULATORY_CARE_PROVIDER_SITE_OTHER): Payer: Medicare Other | Admitting: Internal Medicine

## 2014-04-06 ENCOUNTER — Encounter: Payer: Self-pay | Admitting: Internal Medicine

## 2014-04-06 VITALS — BP 137/65 | HR 96 | Temp 97.8°F | Ht 65.0 in | Wt 220.7 lb

## 2014-04-06 DIAGNOSIS — J45909 Unspecified asthma, uncomplicated: Secondary | ICD-10-CM

## 2014-04-06 DIAGNOSIS — Z0000 Encounter for general adult medical examination without abnormal findings: Secondary | ICD-10-CM

## 2014-04-06 DIAGNOSIS — I10 Essential (primary) hypertension: Secondary | ICD-10-CM

## 2014-04-06 DIAGNOSIS — J302 Other seasonal allergic rhinitis: Secondary | ICD-10-CM

## 2014-04-06 DIAGNOSIS — J309 Allergic rhinitis, unspecified: Secondary | ICD-10-CM

## 2014-04-06 DIAGNOSIS — D62 Acute posthemorrhagic anemia: Secondary | ICD-10-CM

## 2014-04-06 LAB — CBC WITH DIFFERENTIAL/PLATELET
Basophils Absolute: 0.1 10*3/uL (ref 0.0–0.1)
Basophils Relative: 1 % (ref 0–1)
Eosinophils Absolute: 0.3 10*3/uL (ref 0.0–0.7)
Eosinophils Relative: 3 % (ref 0–5)
HCT: 31.5 % — ABNORMAL LOW (ref 36.0–46.0)
Hemoglobin: 10.3 g/dL — ABNORMAL LOW (ref 12.0–15.0)
Lymphocytes Relative: 33 % (ref 12–46)
Lymphs Abs: 2.9 10*3/uL (ref 0.7–4.0)
MCH: 26.2 pg (ref 26.0–34.0)
MCHC: 32.7 g/dL (ref 30.0–36.0)
MCV: 80.2 fL (ref 78.0–100.0)
Monocytes Absolute: 0.7 10*3/uL (ref 0.1–1.0)
Monocytes Relative: 8 % (ref 3–12)
Neutro Abs: 4.8 10*3/uL (ref 1.7–7.7)
Neutrophils Relative %: 55 % (ref 43–77)
Platelets: 393 10*3/uL (ref 150–400)
RBC: 3.93 MIL/uL (ref 3.87–5.11)
RDW: 15.6 % — ABNORMAL HIGH (ref 11.5–15.5)
WBC: 8.8 10*3/uL (ref 4.0–10.5)

## 2014-04-06 MED ORDER — FLUTICASONE PROPIONATE 50 MCG/ACT NA SUSP: 1.0000 | Freq: Every day | NASAL | Status: DC

## 2014-04-06 MED ORDER — NAPROXEN 250 MG PO TABS: 250.0000 mg | ORAL_TABLET | Freq: Two times a day (BID) | ORAL | Status: DC

## 2014-04-06 MED ORDER — ALBUTEROL SULFATE HFA 108 (90 BASE) MCG/ACT IN AERS: 1.0000 | INHALATION_SPRAY | Freq: Four times a day (QID) | RESPIRATORY_TRACT | Status: DC | PRN

## 2014-04-06 MED ORDER — SALINE SPRAY 0.65 % NA SOLN: 1.0000 | Freq: Four times a day (QID) | NASAL | Status: DC

## 2014-04-06 NOTE — Patient Instructions (Addendum)
-  You may restart taking your blood pressure medication. Check your blood pressure at home and call us if it is less than 100/60 or more than 140/90.  -You may use Flonase nasal spray for your sinus symptoms.  -Use saline nasal spray 4 times per day for the post-nasal drip.  -Use albuterol inhaler as needed for the shortness of breath--ask your Pharmacist to show you how to use the inhaler properly.  -Call and make the appointment for the mammogram.  -Follow up with us if your symptoms do not improve. You need a diabetes care appointment in June.   Please bring your medicines with you each time you come.   Medicines may be  Eye drops  Herbal   Vitamins  Pills  Seeing these help us take care of you.

## 2014-04-08 NOTE — Discharge Summary (Signed)
Reviewed. Agree with documentation. 

## 2014-04-09 DIAGNOSIS — J45909 Unspecified asthma, uncomplicated: Secondary | ICD-10-CM | POA: Insufficient documentation

## 2014-04-09 DIAGNOSIS — Z87898 Personal history of other specified conditions: Secondary | ICD-10-CM | POA: Insufficient documentation

## 2014-04-09 DIAGNOSIS — Z0000 Encounter for general adult medical examination without abnormal findings: Secondary | ICD-10-CM | POA: Insufficient documentation

## 2014-04-09 DIAGNOSIS — J302 Other seasonal allergic rhinitis: Secondary | ICD-10-CM | POA: Insufficient documentation

## 2014-04-09 MED ORDER — FERROUS GLUCONATE 324 (38 FE) MG PO TABS: 324.0000 mg | ORAL_TABLET | Freq: Every day | ORAL | Status: DC

## 2014-04-09 NOTE — Assessment & Plan Note (Signed)
She is due for a mammogram but did not want us to call to schedule it

## 2014-04-09 NOTE — Assessment & Plan Note (Signed)
BP Readings from Last 3 Encounters:  04/06/14 137/65  04/04/14 160/80  03/31/14 135/68    Lab Results  Component Value Date   NA 136* 03/27/2014   K 4.1 03/27/2014   CREATININE 0.68 03/27/2014    Assessment: Blood pressure control:   Controlled Progress toward BP goal:   at goal Comments: [name] is on lisinopril-HCTZ 20-25mg  daily but this medication was held due to hypotension 2/2 acute blood loss. Her BP is gradually trending up.   Plan: Medications:  continue current medications Educational resources provided:   Self management tools provided:   Other plans: [name] will follow up in June

## 2014-04-09 NOTE — Assessment & Plan Note (Signed)
She has had no recurrent bleeding after her discharge. Diverticular bleed is the most likely etiology. She has had outpatient f.u with GI with no apparent plans for colonoscopy.  -Checked CBC with HgB trending up to 10.3 from 8.4 prior to her discharge from the hospital.  -She is asymptomatic but will benefit from iron supplementation to replete her iron stores.   -Pt to start iron gluconate daily with breakfast

## 2014-04-09 NOTE — Assessment & Plan Note (Signed)
She has "coughing spells" with reflex cough with deep inspiration with reactive airway disease a likely diagnosis. She reports exposure to mustard gas (from cleaning products mix up) in the past with "sensitive" airway ever since.  Rx albuterol inhaler 1-2 puffs q6hr PRN for SOB  Of note, naproxen can be effective in reactive airway disease to decrease acute inflammation of airways but this patient should not received NSAIDs given her recent GI bleed. Naproxen was entered during visit as Rx but her pharmacy was called to cancel this Rx and the patient was counseled to NOT take Naproxen or other NSAIDs such as Ibuprofen, Aleve, Motrin, ASA, BC Goody powder, Alkaseltzer, as these medication may increase her risk of bleeding. The patient verbalized understanding.

## 2014-04-09 NOTE — Assessment & Plan Note (Signed)
She has postnasal drip with nonproductive cough which is likely due to seasonal allergies. She has been using nasocort.  Rx flonase nasal spray Rx saline nasal spray 4 times daily

## 2014-04-09 NOTE — Progress Notes (Signed)
   Subjective:    Patient ID: [name], female    DOB: 1946-10-12, 68 y.o.   MRN: [id number]  Anemia There has been no abdominal pain, fever, light-headedness, pallor or palpitations.  Diabetes Pertinent negatives for hypoglycemia include no dizziness, headaches or pallor. Pertinent negatives for diabetes include no chest pain and no fatigue.   [name] is a 68 yr old woman with PMH significant for DM2, HTN, diverticulosis who presents for HFU. She was hospitalized for further treatment and evaluation of brisk lower GI bleed. She underwent tagged red cell scan which was unrevealing, had a CT abdomen which showed diverticulosis but no diverticulitis. She was seen by GI while hospitalized and did not undergo colonoscopy as her bleeding resolved and she was thought to have had diverticular colon bleeding. She developed symptomatic anemia due to her GI bleed and received 3 units of pRBC.  She reports no bleeding and no dark stool since her discharge from the hospital.  She has had postnasal drip for the past week or so with a nonproductive cough, no body aches/fever/chills. She has "coughing spells" which lead to shortness of breath.    Review of Systems  Constitutional: Negative for fever, chills, diaphoresis, appetite change and fatigue.  HENT: Positive for postnasal drip and rhinorrhea. Negative for sneezing and sore throat.   Respiratory: Positive for cough and shortness of breath. Negative for wheezing.   Cardiovascular: Negative for chest pain, palpitations and leg swelling.  Gastrointestinal: Negative for nausea, vomiting, abdominal pain, constipation and blood in stool.  Genitourinary: Negative for dysuria and frequency.  Skin: Negative for color change, pallor and rash.  Neurological: Negative for dizziness, light-headedness and headaches.  Psychiatric/Behavioral: Negative for agitation.       Objective:   Physical Exam  Nursing note and vitals  reviewed. Constitutional: She is oriented to person, place, and time. She appears well-developed and well-nourished. No distress.  HENT:  Mouth/Throat: Oropharynx is clear and moist. No oropharyngeal exudate.  Pale nasal turbinates bilaterally with no discharge  Pulmonary/Chest: Effort normal. No respiratory distress. She has no wheezes. She has no rales.  Abdominal: Soft. Bowel sounds are normal. She exhibits no distension. There is no tenderness.  Musculoskeletal: Normal range of motion. She exhibits no edema and no tenderness.  Neurological: She is alert and oriented to person, place, and time.  Skin: Skin is warm and dry. She is not diaphoretic.  Psychiatric: She has a normal mood and affect. Her behavior is normal.          Assessment & Plan:

## 2014-04-11 NOTE — Progress Notes (Signed)
Attending physician note: I have reviewed the presenting complaints, physical findings, and medications with the resident physician Dr. Garald BraverKennerly and concur with her management. [name], M.D., FACP

## 2014-04-12 ENCOUNTER — Telehealth: Payer: Self-pay | Admitting: Dietician

## 2014-04-12 NOTE — Telephone Encounter (Signed)
Called to follow up on blood sugars: she reports her fasting blood sugar is ~110-150,  Mostly in this range at onther times of day when she checks which is less frequently than the fastings which she does daily. Has "self managed" her diabetes medicine to the following doses: metformin 500 mg three times a day Lantus 60 units a day Novolog 10 units with breakfast  And 20 units with dinner.   She asked me to share with her physicians that her Blood pressure has been 110-130/50-54, which she considers good. She asked for the result of her recent hemoglobin which we discussed.  Says she will schedule her mamogram. She also asked to be scheduled for a complete physical which her insurance is saying is due. Note sent to front office to schedule this

## 2014-04-13 NOTE — Telephone Encounter (Signed)
Hi [name],    Thank you so much for checking on her. Her BP and blood sugars appear stable per her report. She should definitely have a visit for her chronic medical issues--sending the message to the front desk is a great idea.   Best,   SK

## 2014-04-16 ENCOUNTER — Encounter (HOSPITAL_BASED_OUTPATIENT_CLINIC_OR_DEPARTMENT_OTHER): Payer: Self-pay | Admitting: Emergency Medicine

## 2014-04-16 ENCOUNTER — Emergency Department (HOSPITAL_BASED_OUTPATIENT_CLINIC_OR_DEPARTMENT_OTHER): Payer: Medicare Other

## 2014-04-16 ENCOUNTER — Emergency Department (HOSPITAL_BASED_OUTPATIENT_CLINIC_OR_DEPARTMENT_OTHER)
Admission: EM | Admit: 2014-04-16 | Discharge: 2014-04-16 | Disposition: A | Discharge status: Home / Self Care | Payer: Medicare Other | Attending: Emergency Medicine | Admitting: Emergency Medicine

## 2014-04-16 DIAGNOSIS — Z794 Long term (current) use of insulin: Secondary | ICD-10-CM | POA: Insufficient documentation

## 2014-04-16 DIAGNOSIS — R296 Repeated falls: Secondary | ICD-10-CM | POA: Insufficient documentation

## 2014-04-16 DIAGNOSIS — F329 Major depressive disorder, single episode, unspecified: Secondary | ICD-10-CM | POA: Insufficient documentation

## 2014-04-16 DIAGNOSIS — H269 Unspecified cataract: Secondary | ICD-10-CM | POA: Insufficient documentation

## 2014-04-16 DIAGNOSIS — S79929A Unspecified injury of unspecified thigh, initial encounter: Secondary | ICD-10-CM

## 2014-04-16 DIAGNOSIS — E119 Type 2 diabetes mellitus without complications: Secondary | ICD-10-CM | POA: Insufficient documentation

## 2014-04-16 DIAGNOSIS — IMO0002 Reserved for concepts with insufficient information to code with codable children: Secondary | ICD-10-CM | POA: Insufficient documentation

## 2014-04-16 DIAGNOSIS — Z8739 Personal history of other diseases of the musculoskeletal system and connective tissue: Secondary | ICD-10-CM | POA: Insufficient documentation

## 2014-04-16 DIAGNOSIS — F3289 Other specified depressive episodes: Secondary | ICD-10-CM | POA: Insufficient documentation

## 2014-04-16 DIAGNOSIS — Z79899 Other long term (current) drug therapy: Secondary | ICD-10-CM | POA: Insufficient documentation

## 2014-04-16 DIAGNOSIS — G609 Hereditary and idiopathic neuropathy, unspecified: Secondary | ICD-10-CM | POA: Insufficient documentation

## 2014-04-16 DIAGNOSIS — E785 Hyperlipidemia, unspecified: Secondary | ICD-10-CM | POA: Insufficient documentation

## 2014-04-16 DIAGNOSIS — M549 Dorsalgia, unspecified: Secondary | ICD-10-CM

## 2014-04-16 DIAGNOSIS — I10 Essential (primary) hypertension: Secondary | ICD-10-CM | POA: Insufficient documentation

## 2014-04-16 DIAGNOSIS — K219 Gastro-esophageal reflux disease without esophagitis: Secondary | ICD-10-CM | POA: Insufficient documentation

## 2014-04-16 DIAGNOSIS — R42 Dizziness and giddiness: Secondary | ICD-10-CM | POA: Insufficient documentation

## 2014-04-16 DIAGNOSIS — Y9389 Activity, other specified: Secondary | ICD-10-CM | POA: Insufficient documentation

## 2014-04-16 DIAGNOSIS — Y9210 Unspecified residential institution as the place of occurrence of the external cause: Secondary | ICD-10-CM | POA: Insufficient documentation

## 2014-04-16 DIAGNOSIS — S79919A Unspecified injury of unspecified hip, initial encounter: Secondary | ICD-10-CM | POA: Insufficient documentation

## 2014-04-16 LAB — BASIC METABOLIC PANEL
BUN: 19 mg/dL (ref 6–23)
CO2: 27 mEq/L (ref 19–32)
Calcium: 10.1 mg/dL (ref 8.4–10.5)
Chloride: 100 mEq/L (ref 96–112)
Creatinine, Ser: 0.8 mg/dL (ref 0.50–1.10)
GFR calc Af Amer: 86 mL/min — ABNORMAL LOW (ref >90)
GFR calc non Af Amer: 75 mL/min — ABNORMAL LOW (ref >90)
Glucose, Bld: 194 mg/dL — ABNORMAL HIGH (ref 70–99)
Potassium: 4.3 mEq/L (ref 3.7–5.3)
Sodium: 141 mEq/L (ref 137–147)

## 2014-04-16 LAB — URINALYSIS, ROUTINE W REFLEX MICROSCOPIC
Bilirubin Urine: NEGATIVE
Glucose, UA: 100 mg/dL — AB
Hgb urine dipstick: NEGATIVE
Ketones, ur: NEGATIVE mg/dL
Nitrite: NEGATIVE
Protein, ur: NEGATIVE mg/dL
Specific Gravity, Urine: 1.017 (ref 1.005–1.030)
Urobilinogen, UA: 0.2 mg/dL (ref 0.0–1.0)
pH: 5.5 (ref 5.0–8.0)

## 2014-04-16 LAB — CBC WITH DIFFERENTIAL/PLATELET
Basophils Absolute: 0.1 10*3/uL (ref 0.0–0.1)
Basophils Relative: 0 % (ref 0–1)
Eosinophils Absolute: 0.1 10*3/uL (ref 0.0–0.7)
Eosinophils Relative: 1 % (ref 0–5)
HCT: 33.9 % — ABNORMAL LOW (ref 36.0–46.0)
Hemoglobin: 10.8 g/dL — ABNORMAL LOW (ref 12.0–15.0)
Lymphocytes Relative: 13 % (ref 12–46)
Lymphs Abs: 1.9 10*3/uL (ref 0.7–4.0)
MCH: 26.1 pg (ref 26.0–34.0)
MCHC: 31.9 g/dL (ref 30.0–36.0)
MCV: 81.9 fL (ref 78.0–100.0)
Monocytes Absolute: 0.9 10*3/uL (ref 0.1–1.0)
Monocytes Relative: 6 % (ref 3–12)
Neutro Abs: 11.8 10*3/uL — ABNORMAL HIGH (ref 1.7–7.7)
Neutrophils Relative %: 80 % — ABNORMAL HIGH (ref 43–77)
Platelets: 325 10*3/uL (ref 150–400)
RBC: 4.14 MIL/uL (ref 3.87–5.11)
RDW: 15.3 % (ref 11.5–15.5)
WBC: 14.8 10*3/uL — ABNORMAL HIGH (ref 4.0–10.5)

## 2014-04-16 LAB — URINE MICROSCOPIC-ADD ON

## 2014-04-16 MED ORDER — TRAMADOL HCL 50 MG PO TABS
50.0000 mg | ORAL_TABLET | Freq: Four times a day (QID) | ORAL | Status: DC | PRN
Start: 2014-04-16 — End: 2014-05-13

## 2014-04-16 MED ORDER — FENTANYL CITRATE 0.05 MG/ML IJ SOLN
50.0000 ug | Freq: Once | INTRAMUSCULAR | Status: AC
  Administered 2014-04-16: 50 ug via INTRAVENOUS
  Filled 2014-04-16: qty 2

## 2014-04-16 MED ORDER — ONDANSETRON HCL 4 MG/2ML IJ SOLN
4.0000 mg | Freq: Once | INTRAMUSCULAR | Status: AC
  Administered 2014-04-16: 4 mg via INTRAVENOUS
  Filled 2014-04-16: qty 2

## 2014-04-16 MED ORDER — TRAMADOL HCL 50 MG PO TABS
50.0000 mg | ORAL_TABLET | Freq: Once | ORAL | Status: AC
  Administered 2014-04-16: 50 mg via ORAL
  Filled 2014-04-16: qty 1

## 2014-04-16 NOTE — ED Provider Notes (Signed)
CSN: [id number]     Arrival date & time 04/16/14  4098 History   First MD Initiated Contact with Patient 04/16/14 [phone]     Chief Complaint  Patient presents with  . Fall     (Consider location/radiation/quality/duration/timing/severity/associated sxs/prior Treatment) HPI Comments: Patient presents with a fall. 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 She landed more in her buttocks area and complains of constant throbbing pain to her lower back. She states at times the pain radiates down her left leg. She denies any numbness in her legs. She denies any loss of bowel or bladder control. She had a history of a back injury several years ago but has not had any ongoing back pain. She denies any other injuries from the fall. She states she did not hit her head. She's not sure what lead to the fall. She denies any dizziness, vertigo or generalized weakness. She denies any chest pain or shortness of breath. She states she's otherwise been well. She has occasional bouts of dizziness if she stands up too fast but says it's not any different than it normally is. She was admitted about 3 weeks ago for a GI bleed and received 3 units of blood. She states her last hemoglobin on recheck was around 10. She denies any ongoing bleeding or melena.  Patient is a 68 y.o. female presenting with fall.  Fall Pertinent negatives include no chest pain, no abdominal pain, no headaches and no shortness of breath.    Past Medical History  Diagnosis Date  . GERD (gastroesophageal reflux disease)   . Hyperlipidemia   . Hypertension   . Diabetes mellitus     type II  . Depression   . Peripheral neuropathy   . Venous insufficiency   . Achilles tendinitis   . Right shoulder pain     Subacromial tendinitis  . Calcaneal spur     right  . Ventral hernia   . Cataract   . Diverticulosis of colon (without mention of hemorrhage)  2013  . Internal hemorrhoids 2013  . GI bleed 03/26/14   Past Surgical History  Procedure Laterality Date  . Cholecystectomy  2001  . Abdominal hysterectomy  2001    TAH-BSO  . Incisional hernia repair    . Tonsillectomy    . Esophagogastroduodenoscopy  2013    normal   . Colonoscopy  2013    diverticulosis    Family History  Problem Relation Age of Onset  . Heart failure Father     Died in 46s  . Colon cancer Neg Hx   . Ovarian cancer Sister   . Diabetes Sister   . Diabetes Paternal Grandmother   . Heart failure Mother     Died in 82s  . CVA Father    History  Substance Use Topics  . Smoking status: Never Smoker   . Smokeless tobacco: Never Used  . Alcohol Use: No   OB History   Grav Para Term Preterm Abortions TAB SAB Ect Mult Living                 Review of Systems  Constitutional: Negative for fever, chills, diaphoresis and fatigue.  HENT: Negative for congestion, rhinorrhea and sneezing.   Eyes: Negative.   Respiratory: Negative for cough, chest tightness and shortness of breath.   Cardiovascular: Negative for chest pain and leg swelling.  Gastrointestinal: Negative  for nausea, vomiting, abdominal pain, diarrhea and blood in stool.  Genitourinary: Negative for frequency, hematuria, flank pain and difficulty urinating.  Musculoskeletal: Positive for back pain. Negative for arthralgias.  Skin: Negative for rash.  Neurological: Positive for light-headedness (occassional). Negative for speech difficulty, weakness, numbness and headaches.      Allergies  Morphine; Codeine sulfate; Meperidine hcl; and Propoxyphene hcl  Home Medications   Prior to Admission medications   Medication Sig Start Date End Date Taking? Authorizing Provider  albuterol (PROAIR HFA) 108 (90 BASE) MCG/ACT inhaler Inhale 1-2 puffs into the lungs every 6 (six) hours as needed for wheezing or shortness of breath. 04/06/14   Ky BarbanSolianny D Kennerly, MD  DULoxetine (CYMBALTA) 60 MG capsule  Take 1 capsule (60 mg total) by mouth daily. 09/22/13   Judie BonusElizabeth A Kollar, MD  esomeprazole (NEXIUM) 40 MG capsule Take 40 mg by mouth daily before breakfast.    Historical Provider, MD  ferrous gluconate (FERGON) 324 MG tablet Take 1 tablet (324 mg total) by mouth daily with breakfast. 04/09/14   Ky BarbanSolianny D Kennerly, MD  fluticasone (FLONASE) 50 MCG/ACT nasal spray Place 1 spray into both nostrils daily. 04/06/14   Ky BarbanSolianny D Kennerly, MD  gabapentin (NEURONTIN) 300 MG capsule Take 300 mg by mouth 3 (three) times daily.    Historical Provider, MD  insulin aspart (NOVOLOG) 100 UNIT/ML injection Take 15 units with breakfast and lunch, 20 units with night time meal. 03/01/14   Judie BonusElizabeth A Kollar, MD  insulin glargine (LANTUS) 100 UNIT/ML injection Inject 70 Units into the skin at bedtime.    Historical Provider, MD  lisinopril-hydrochlorothiazide (PRINZIDE,ZESTORETIC) 20-25 MG per tablet Take 1 tablet by mouth daily. 04/06/13   Rocco SereneLawrence D Klima, MD  metFORMIN (GLUCOPHAGE) 1000 MG tablet Take 500-1,000 mg by mouth 2 (two) times daily with a meal. Pt takes either a whole or a half tablet depending on the size of the meal she is having    Historical Provider, MD  pravastatin (PRAVACHOL) 80 MG tablet Take 80 mg by mouth every evening. Pt prefers to take with a meal    Historical Provider, MD  sodium chloride (OCEAN) 0.65 % SOLN nasal spray Place 1 spray into both nostrils 4 (four) times daily. 04/06/14   Ky BarbanSolianny D Kennerly, MD  Triamcinolone Acetonide (NASACORT AQ NA) Place 1 spray into both nostrils daily as needed (allergies).    Historical Provider, MD  Trolamine Salicylate (ASPERCREME EX) Apply 1 application topically daily as needed (shoulder/hips/knee/knuckles).    Historical Provider, MD   BP 105/49  Pulse 91  Temp(Src) 98.2 F (36.8 C) (Oral)  Resp 19  SpO2 96% Physical Exam  Constitutional: She is oriented to person, place, and time. She appears well-developed and well-nourished.  HENT:  Head:  Normocephalic and atraumatic.  Eyes: Pupils are equal, round, and reactive to light.  Neck: Normal range of motion. Neck supple.  Cardiovascular: Normal rate, regular rhythm and normal heart sounds.   Pulmonary/Chest: Effort normal and breath sounds normal. No respiratory distress. She has no wheezes. She has no rales. She exhibits no tenderness.  No rib tenderness. No signs of external trauma to the chest or abdomen.  Abdominal: Soft. Bowel sounds are normal. There is no tenderness. There is no rebound and no guarding.  Musculoskeletal: Normal range of motion. She exhibits no edema.  Positive tenderness to the lower lumbar spine. There is also pain to the left sacral area. There some mild pain on range of motion of the left hip  however it appears to be more located in her lower back. No step-offs or deformities are noted. There's no pain to the cervical or thoracic spine. There is no pain on palpation or range of motion of her other extremities.  Lymphadenopathy:    She has no cervical adenopathy.  Neurological: She is alert and oriented to person, place, and time. She has normal strength. No cranial nerve deficit or sensory deficit. GCS eye subscore is 4. GCS verbal subscore is 5. GCS motor subscore is 6.  Skin: Skin is warm and dry. No rash noted.  Psychiatric: She has a normal mood and affect.    ED Course  Procedures (including critical care time) Labs Review Results for orders placed during the hospital encounter of 04/16/14  CBC WITH DIFFERENTIAL      Result Value Ref Range   WBC 14.8 (*) 4.0 - 10.5 K/uL   RBC 4.14  3.87 - 5.11 MIL/uL   Hemoglobin 10.8 (*) 12.0 - 15.0 g/dL   HCT 41.333.9 (*) 24.436.0 - 01.046.0 %   MCV 81.9  78.0 - 100.0 fL   MCH 26.1  26.0 - 34.0 pg   MCHC 31.9  30.0 - 36.0 g/dL   RDW 27.215.3  53.611.5 - 64.415.5 %   Platelets 325  150 - 400 K/uL   Neutrophils Relative % 80 (*) 43 - 77 %   Neutro Abs 11.8 (*) 1.7 - 7.7 K/uL   Lymphocytes Relative 13  12 - 46 %   Lymphs Abs 1.9   0.7 - 4.0 K/uL   Monocytes Relative 6  3 - 12 %   Monocytes Absolute 0.9  0.1 - 1.0 K/uL   Eosinophils Relative 1  0 - 5 %   Eosinophils Absolute 0.1  0.0 - 0.7 K/uL   Basophils Relative 0  0 - 1 %   Basophils Absolute 0.1  0.0 - 0.1 K/uL  URINALYSIS, ROUTINE W REFLEX MICROSCOPIC      Result Value Ref Range   Color, Urine YELLOW  YELLOW   APPearance CLEAR  CLEAR   Specific Gravity, Urine 1.017  1.005 - 1.030   pH 5.5  5.0 - 8.0   Glucose, UA 100 (*) NEGATIVE mg/dL   Hgb urine dipstick NEGATIVE  NEGATIVE   Bilirubin Urine NEGATIVE  NEGATIVE   Ketones, ur NEGATIVE  NEGATIVE mg/dL   Protein, ur NEGATIVE  NEGATIVE mg/dL   Urobilinogen, UA 0.2  0.0 - 1.0 mg/dL   Nitrite NEGATIVE  NEGATIVE   Leukocytes, UA TRACE (*) NEGATIVE  BASIC METABOLIC PANEL      Result Value Ref Range   Sodium 141  137 - 147 mEq/L   Potassium 4.3  3.7 - 5.3 mEq/L   Chloride 100  96 - 112 mEq/L   CO2 27  19 - 32 mEq/L   Glucose, Bld 194 (*) 70 - 99 mg/dL   BUN 19  6 - 23 mg/dL   Creatinine, Ser 0.340.80  0.50 - 1.10 mg/dL   Calcium 74.210.1  8.4 - 59.510.5 mg/dL   GFR calc non Af Amer 75 (*) >90 mL/min   GFR calc Af Amer 86 (*) >90 mL/min  URINE MICROSCOPIC-ADD ON      Result Value Ref Range   Squamous Epithelial / LPF FEW (*) RARE   WBC, UA 0-2  <3 WBC/hpf   RBC / HPF 0-2  <3 RBC/hpf   Bacteria, UA FEW (*) RARE   Casts HYALINE CASTS (*) NEGATIVE   Urine-Other MUCOUS PRESENT  Dg Lumbar Spine Complete  04/16/2014   CLINICAL DATA:  Larey Seat this morning with low back pain and left hip pain  EXAM: LUMBAR SPINE - COMPLETE 4+ VIEW  COMPARISON:  None.  FINDINGS: Normal alignment with no fracture. Mild multilevel degenerative disc disease. Calcification of the aorta.  IMPRESSION: No acute findings   Electronically Signed   By: Esperanza Heir M.D.   On: 04/16/2014 10:28   Dg Hip Complete Left  04/16/2014   CLINICAL DATA:  Fall, left hip pain  EXAM: LEFT HIP - COMPLETE 2+ VIEW  COMPARISON:  None.  FINDINGS: Pelvic bones are  intact. No evidence of femur fracture or dislocation. Mild bilateral sacroiliac degenerative change.  IMPRESSION: No acute abnormalities.   Electronically Signed   By: Esperanza Heir M.D.   On: 04/16/2014 10:29   Imaging Review Dg Lumbar Spine Complete  04/16/2014   CLINICAL DATA:  Larey Seat this morning with low back pain and left hip pain  EXAM: LUMBAR SPINE - COMPLETE 4+ VIEW  COMPARISON:  None.  FINDINGS: Normal alignment with no fracture. Mild multilevel degenerative disc disease. Calcification of the aorta.  IMPRESSION: No acute findings   Electronically Signed   By: Esperanza Heir M.D.   On: 04/16/2014 10:28   Dg Hip Complete Left  04/16/2014   CLINICAL DATA:  Fall, left hip pain  EXAM: LEFT HIP - COMPLETE 2+ VIEW  COMPARISON:  None.  FINDINGS: Pelvic bones are intact. No evidence of femur fracture or dislocation. Mild bilateral sacroiliac degenerative change.  IMPRESSION: No acute abnormalities.   Electronically Signed   By: Esperanza Heir M.D.   On: 04/16/2014 10:29     EKG Interpretation   Date/Time:  Saturday Apr 16 2014 09:58:59 EDT Ventricular Rate:  95 PR Interval:  118 QRS Duration: 88 QT Interval:  354 QTC Calculation: 444 R Axis:   -30 Text Interpretation:  Normal sinus rhythm Left axis deviation Abnormal ECG  since last tracing no significant change Confirmed by Aldean Suddeth  MD, Delrick Dehart  ([id number]) on 04/16/2014 10:09:51 AM      MDM   Final diagnoses:  Back pain    Patient presents with back pain after a fall. Her fall seems to be mechanical in nature however she couldn't definitely see what lead to the fall. I did do some screening labs which showed a hemoglobin similar to her past values. Her blood sugar was mildly elevated. Her white count was mildly elevated however not by any other source of infection. She's afebrile. She reports some intermittent abdominal pain but her abdomen is nontender on exam. She has no vomiting or other symptoms that would make me feel that we need to  do imaging studies today. She has no evidence of fracture or subluxation to her spine. There is no evidence of a hip fracture. She was given pain medication the ED and is feeling better after this. She states she's had problems with a lot of different pain medications. She was given a dose of Ultram which she did okay with here in the ED. She requested a prescription for this. However she is on Cymbalta which is listed as an interaction with Ultram, leading to possible serotonin syndrome. I discussed this with the patient and she says that she hasn't been taking her Cymbalta regularly anyway and she is going to stop it for a long time. Given that she's not taking it with any regularity, I felt that it was ok to ahead and stop it. I gave  her a prescription for Ultram and encouraged her to followup with her primary care physician.    Rolan Bucco, MD 04/16/14 [phone]

## 2014-04-16 NOTE — ED Notes (Signed)
Patient c/o lower back pain due to fall, states she sat hard on her bottom, no loc

## 2014-04-16 NOTE — Discharge Instructions (Signed)
Back Pain, Adult Low back pain is very common. About 1 in 5 people have back pain.The cause of low back pain is rarely dangerous. The pain often gets better over time.About half of people with a sudden onset of back pain feel better in just 2 weeks. About 8 in 10 people feel better by 6 weeks.  CAUSES Some common causes of back pain include:  Strain of the muscles or ligaments supporting the spine.  Wear and tear (degeneration) of the spinal discs.  Arthritis.  Direct injury to the back. DIAGNOSIS Most of the time, the direct cause of low back pain is not known.However, back pain can be treated effectively even when the exact cause of the pain is unknown.Answering your caregiver's questions about your overall health and symptoms is one of the most accurate ways to make sure the cause of your pain is not dangerous. If your caregiver needs more information, he or she may order lab work or imaging tests (X-rays or MRIs).However, even if imaging tests show changes in your back, this usually does not require surgery. HOME CARE INSTRUCTIONS For many people, back pain returns.Since low back pain is rarely dangerous, it is often a condition that people can learn to manageon their own.   Remain active. It is stressful on the back to sit or stand in one place. Do not sit, drive, or stand in one place for more than 30 minutes at a time. Take short walks on level surfaces as soon as pain allows.Try to increase the length of time you walk each day.  Do not stay in bed.Resting more than 1 or 2 days can delay your recovery.  Do not avoid exercise or work.Your body is made to move.It is not dangerous to be active, even though your back may hurt.Your back will likely heal faster if you return to being active before your pain is gone.  Pay attention to your body when you bend and lift. Many people have less discomfortwhen lifting if they bend their knees, keep the load close to their bodies,and  avoid twisting. Often, the most comfortable positions are those that put less stress on your recovering back.  Find a comfortable position to sleep. Use a firm mattress and lie on your side with your knees slightly bent. If you lie on your back, put a pillow under your knees.  Only take over-the-counter or prescription medicines as directed by your caregiver. Over-the-counter medicines to reduce pain and inflammation are often the most helpful.Your caregiver may prescribe muscle relaxant drugs.These medicines help dull your pain so you can more quickly return to your normal activities and healthy exercise.  Put ice on the injured area.  Put ice in a plastic bag.  Place a towel between your skin and the bag.  Leave the ice on for 15-20 minutes, 03-04 times a day for the first 2 to 3 days. After that, ice and heat may be alternated to reduce pain and spasms.  Ask your caregiver about trying back exercises and gentle massage. This may be of some benefit.  Avoid feeling anxious or stressed.Stress increases muscle tension and can worsen back pain.It is important to recognize when you are anxious or stressed and learn ways to manage it.Exercise is a great option. SEEK MEDICAL CARE IF:  You have pain that is not relieved with rest or medicine.  You have pain that does not improve in 1 week.  You have new symptoms.  You are generally not feeling well. SEEK   IMMEDIATE MEDICAL CARE IF:   You have pain that radiates from your back into your legs.  You develop new bowel or bladder control problems.  You have unusual weakness or numbness in your arms or legs.  You develop nausea or vomiting.  You develop abdominal pain.  You feel faint. Document Released: 11/25/2005 Document Revised: 05/26/2012 Document Reviewed: 04/15/2011 [employer] Patient Information 2014 [employer].  

## 2014-04-17 ENCOUNTER — Other Ambulatory Visit: Payer: Self-pay | Admitting: Internal Medicine

## 2014-04-19 ENCOUNTER — Other Ambulatory Visit: Payer: Self-pay | Admitting: Internal Medicine

## 2014-05-13 ENCOUNTER — Ambulatory Visit (INDEPENDENT_AMBULATORY_CARE_PROVIDER_SITE_OTHER): Payer: Medicare Other | Admitting: Internal Medicine

## 2014-05-13 ENCOUNTER — Encounter: Payer: Self-pay | Admitting: Internal Medicine

## 2014-05-13 VITALS — BP 110/65 | HR 85 | Temp 97.9°F | Ht 65.0 in | Wt 220.4 lb

## 2014-05-13 DIAGNOSIS — W19XXXA Unspecified fall, initial encounter: Secondary | ICD-10-CM

## 2014-05-13 DIAGNOSIS — I10 Essential (primary) hypertension: Secondary | ICD-10-CM

## 2014-05-13 DIAGNOSIS — E785 Hyperlipidemia, unspecified: Secondary | ICD-10-CM

## 2014-05-13 DIAGNOSIS — E1165 Type 2 diabetes mellitus with hyperglycemia: Secondary | ICD-10-CM

## 2014-05-13 DIAGNOSIS — IMO0002 Reserved for concepts with insufficient information to code with codable children: Secondary | ICD-10-CM

## 2014-05-13 DIAGNOSIS — K5730 Diverticulosis of large intestine without perforation or abscess without bleeding: Secondary | ICD-10-CM

## 2014-05-13 DIAGNOSIS — E1149 Type 2 diabetes mellitus with other diabetic neurological complication: Secondary | ICD-10-CM

## 2014-05-13 LAB — POCT GLYCOSYLATED HEMOGLOBIN (HGB A1C): Hemoglobin A1C: 9.7

## 2014-05-13 LAB — GLUCOSE, CAPILLARY: Glucose-Capillary: 257 mg/dL — ABNORMAL HIGH (ref 70–99)

## 2014-05-13 MED ORDER — DICLOFENAC SODIUM 1 % TD GEL: 2.0000 g | Freq: Four times a day (QID) | TRANSDERMAL | Status: DC

## 2014-05-13 NOTE — Patient Instructions (Signed)
We will have you take ibuprofen 400 mg (2 of the 200 mg tablets) 3 times per day for 2 weeks. This will help to calm down the inflammation and help your muscles to heal.   We need to do better with your diabetes as you are not doing well. It would be very helpful if you could get a meter and strips to monitor your sugars as it is very hard to make changes without your sugars.  We will bring you back in about 1 month to check on your sugars as this is very important.   Call us with questions or problems before then at [phone].

## 2014-05-13 NOTE — Progress Notes (Signed)
Subjective:     Patient ID: [name], female   DOB: 1946/11/13, 68 y.o.   MRN: [id number]  HPI The patient is a 68 YO female who has PMH of type II DM, IBS, venous insufficiency, HTN, GERD. She was hospitalized in the last 2 months for diverticular bleed however she has not had bleeding recently. She still has a rough home life and this keeps her from keeping a close watch on her sugars. She is no longer seeing her significant other and he took her meter with him. She has been unable to monitor her sugars at all and is taking 12 units with small meals. She is taking 20 units of lantus at night. She states that she is not having hypoglycemic episodes. She has been still snacking frequently and eating quick meals that are not healthy. She had a fall in May and is still sore around her hips since that time (review of medical records indicate she had x-ray at that time which was negative for fracture). She denies chest pain, SOB, nausea, vomiting, abdominal pain.    Review of Systems  Constitutional: Negative for fever, chills, diaphoresis, activity change, appetite change, fatigue and unexpected weight change.  HENT: Negative.   Eyes: Negative.   Respiratory: Negative for cough, chest tightness, shortness of breath and wheezing.   Cardiovascular: Negative for chest pain, palpitations and leg swelling.  Gastrointestinal: Negative for nausea, vomiting, abdominal pain and constipation.  Endocrine: Negative.  Negative for polydipsia, polyphagia and polyuria.  Genitourinary: Negative.   Musculoskeletal: Positive for myalgias. Negative for arthralgias, back pain, gait problem and joint swelling.  Skin: Negative.   Neurological: Negative for dizziness, tremors, seizures, syncope, facial asymmetry, speech difficulty, weakness, light-headedness, numbness and headaches.  Psychiatric/Behavioral: Negative.        Objective:   Physical Exam  Constitutional: She is oriented to person, place, and  time. She appears well-developed and well-nourished. No distress.  Obese  HENT:  Head: Normocephalic and atraumatic.  Eyes: EOM are normal. Pupils are equal, round, and reactive to light.  Neck: Normal range of motion. Neck supple. No JVD present. No thyromegaly present.  Cardiovascular: Normal rate and normal heart sounds.   No murmur heard. Pulmonary/Chest: Effort normal and breath sounds normal. No respiratory distress. She has no wheezes. She has no rales. She exhibits no tenderness.  Abdominal: Soft. Bowel sounds are normal. There is no tenderness. There is no rebound and no guarding.  Musculoskeletal: Normal range of motion. She exhibits tenderness. She exhibits no edema.  Some tenderness in trochanteric bursa but no tenderness over the hip joint.   Neurological: She is alert and oriented to person, place, and time. No cranial nerve deficit.  Skin: Skin is warm and dry. No rash noted. She is not diaphoretic. No erythema. No pallor.  Psychiatric: She has a normal mood and affect. Her behavior is normal.       Assessment/Plan:   1. Please see problem oriented charting.   2. Patient will return in about 1 months. She will work on getting her meter back and is given sugar log. She was talked to about the seriousness of her sugars being elevated. She will elevate her lantus to 50 units at night time once she is able to get her meter. Advised to use ibuprofen scheduled for 2 weeks for pain and given rx for voltaren gel to use prn for pain.

## 2014-05-13 NOTE — Assessment & Plan Note (Addendum)
She does not take her pravachol and states that it makes her ache. Advised her that we can reduce dosing or change medication but she is uninterested in taking it at all. Advised her of her higher risk of CV disease with her diabetes and she is aware.

## 2014-05-13 NOTE — Assessment & Plan Note (Signed)
Her HgA1c is elevated from last value today to 9.7 and we had a talk about the importance of good control and how her high sugars could be causing some of the fatigue and not feeling well that she was talking about. She is willing to try to get a meter (either her old one or a new one). She is given sugar log and advised to check at least 2 times per day. She will try although she was not sounding too optimistic. She continues on metformin although at times her GI symptoms cause her to not wish to take the medication. She currently is taking 500 mg in the morning and 1000 mg at night and is unable to increase at this time. She does not wish to meet with the diabetic coordinator and also spent about 5 minutes discussing exercise and diet with her. Due to fall she has been eating food that is pre-prepared and not that healthy and hopefully she will be able to eat her diet again soon.

## 2014-05-13 NOTE — Assessment & Plan Note (Signed)
No further bleeding from the colon since bleed and last Hg stable. She continues taking iron pills.

## 2014-05-13 NOTE — Assessment & Plan Note (Signed)
BP well controlled on lisinopril/HCTZ 20/25 daily.

## 2014-05-19 NOTE — Progress Notes (Signed)
Case discussed with Dr. Kollar soon after the resident saw the patient.  We reviewed the resident's history and exam and pertinent patient test results.  I agree with the assessment, diagnosis, and plan of care documented in the resident's note. 

## 2014-06-03 ENCOUNTER — Ambulatory Visit (INDEPENDENT_AMBULATORY_CARE_PROVIDER_SITE_OTHER): Payer: Self-pay | Admitting: Emergency Medicine

## 2014-06-03 VITALS — BP 126/82 | HR 86 | Temp 98.2°F | Resp 16 | Ht 64.25 in | Wt 221.0 lb

## 2014-06-03 DIAGNOSIS — L988 Other specified disorders of the skin and subcutaneous tissue: Secondary | ICD-10-CM

## 2014-06-03 DIAGNOSIS — L858 Other specified epidermal thickening: Secondary | ICD-10-CM

## 2014-06-03 NOTE — Progress Notes (Signed)
[facility] [address], [place] [id number] [phone]  Date:  06/03/2014   Name:  [name] Mavis   DOB:  [birth year]   MRN:  [id number]  PCP:  Genella MechKOLLAR, ELIZABETH, MD    Chief Complaint: wart on right hand   History of Present Illness:  [name] Hyneman is a 68 y.o. very pleasant female patient who presents with the following:  Has an enlarging mass on right dorsal hand.  Now becoming unsightly and interferes with ADL.  Denies other complaint or health concern today.   Patient Active Problem List   Diagnosis Date Noted  . Reactive airway disease 04/09/2014  . Seasonal allergic rhinitis 04/09/2014  . Preventative health care 04/09/2014  . Diverticulosis of colon 03/28/2014  . Abnormality of gait 03/17/2013  . Irritable bowel syndrome (IBS) 08/27/2012  . ACHILLES TENDINITIS 04/07/2009  . Type II diabetes mellitus with neurological manifestations, uncontrolled 06/18/2007  . GERD 06/18/2007  . HYPERLIPIDEMIA 12/27/2006  . HYPERTENSION 09/19/2006  . VENOUS INSUFFICIENCY 09/19/2006    Past Medical History  Diagnosis Date  . GERD (gastroesophageal reflux disease)   . Hyperlipidemia   . Hypertension   . Diabetes mellitus     type II  . Depression   . Peripheral neuropathy   . Venous insufficiency   . Achilles tendinitis   . Right shoulder pain     Subacromial tendinitis  . Calcaneal spur     right  . Ventral hernia   . Cataract   . Diverticulosis of colon (without mention of hemorrhage) 2013  . Internal hemorrhoids 2013  . GI bleed 03/26/14    Past Surgical History  Procedure Laterality Date  . Cholecystectomy  2001  . Abdominal hysterectomy  2001    TAH-BSO  . Incisional hernia repair    . Tonsillectomy    . Esophagogastroduodenoscopy  2013    normal   . Colonoscopy  2013    diverticulosis     History  Substance Use Topics  . Smoking status: Never Smoker   . Smokeless tobacco: Never Used  . Alcohol Use: No     Family History  Problem Relation Age of Onset  . Heart failure Father     Died in 6780s  . Colon cancer Neg Hx   . Ovarian cancer Sister   . Diabetes Sister   . Diabetes Paternal Grandmother   . Heart failure Mother     Died in 3380s  . CVA Father     Allergies  Allergen Reactions  . Morphine Other (See Comments)    'took me out of this world' I had to be resusitated  . Codeine Sulfate Other (See Comments)    REACTION: GI upset and pain  . Meperidine Hcl Nausea And Vomiting and Swelling  . Propoxyphene Hcl Other (See Comments)    unknown    Medication list has been reviewed and updated.  Current Outpatient Prescriptions on File Prior to Visit  Medication Sig Dispense Refill  . esomeprazole (NEXIUM) 40 MG capsule Take 40 mg by mouth daily before breakfast.      . ferrous gluconate (FERGON) 324 MG tablet Take 1 tablet (324 mg total) by mouth daily with breakfast.  30 tablet  3  . fluticasone (FLONASE) 50 MCG/ACT nasal spray Place 1 spray into both nostrils daily.  9.9 g  2  . gabapentin (NEURONTIN) 300 MG capsule Take 1 capsule (300 mg total) by mouth 3 (three) times daily.  90 capsule  1  . insulin aspart (NOVOLOG) 100 UNIT/ML injection Take 15 units with breakfast and lunch, 20 units with night time meal.  10 mL  10  . insulin glargine (LANTUS) 100 UNIT/ML injection Inject 70 Units into the skin at bedtime.      Marland Kitchen. lisinopril-hydrochlorothiazide (PRINZIDE,ZESTORETIC) 20-25 MG per tablet TAKE 1 TABLET BY MOUTH DAILY.  90 tablet  0  . metFORMIN (GLUCOPHAGE) 1000 MG tablet Take 0.5-1 tablets (500-1,000 mg total) by mouth 2 (two) times daily with a meal.  60 tablet  1  . sodium chloride (OCEAN) 0.65 % SOLN nasal spray Place 1 spray into both nostrils 4 (four) times daily.  30 mL  2  . Trolamine Salicylate (ASPERCREME EX) Apply 1 application topically daily as needed (shoulder/hips/knee/knuckles).      . diclofenac sodium (VOLTAREN) 1 % GEL Apply 2 g topically 4 (four) times daily.   100 g  3  . pravastatin (PRAVACHOL) 80 MG tablet Take 1 tablet (80 mg total) by mouth every evening.  30 tablet  1   No current facility-administered medications on file prior to visit.    Review of Systems:  As per HPI, otherwise negative.    Physical Examination: Filed Vitals:   06/03/14 1214  BP: 126/82  Pulse: 86  Temp: 98.2 F (36.8 C)  Resp: 16   Filed Vitals:   06/03/14 1214  Height: 5' 4.25" (1.632 m)  Weight: 221 lb (100.245 kg)   Body mass index is 37.64 kg/(m^2). Ideal Body Weight: Weight in (lb) to have BMI = 25: 146.5   GEN: WDWN, NAD, Non-toxic, Alert & Oriented x 3 HEENT: Atraumatic, Normocephalic.  Ears and Nose: No external deformity. EXTR: No clubbing/cyanosis/edema NEURO: Normal gait.  PSYCH: Normally interactive. Conversant. Not depressed or anxious appearing.  Calm demeanor.  Cutaneous horn Right hand  Assessment and Plan: ? Squamous cell cancer hand Dermatology consultation   Signed,  Phillips OdorJeffery Karishma Unrein, MD

## 2014-06-13 ENCOUNTER — Ambulatory Visit: Payer: Medicare Other | Admitting: Internal Medicine

## 2014-06-30 ENCOUNTER — Telehealth: Payer: Self-pay | Admitting: Gastroenterology

## 2014-06-30 NOTE — Telephone Encounter (Signed)
Spoke with patient and she is reporting pain in LLQ usually 1/2 hour prior to bowel movement. Pain started about a month ago but is now much worse. Also, reports she has to take clothes with her everywhere she goes due to urgent bowel movements and not always making to the bathroom on time. Patient scheduled with next available OV - 07/06/14 at 9:30 AM with Dr. Russella DarStark. Patient understands that if pain gets worse, fever or bleeding she should go to ED.

## 2014-07-06 ENCOUNTER — Encounter: Payer: Self-pay | Admitting: Gastroenterology

## 2014-07-06 ENCOUNTER — Ambulatory Visit (INDEPENDENT_AMBULATORY_CARE_PROVIDER_SITE_OTHER): Payer: Medicare Other | Admitting: Gastroenterology

## 2014-07-06 ENCOUNTER — Other Ambulatory Visit (INDEPENDENT_AMBULATORY_CARE_PROVIDER_SITE_OTHER): Payer: Medicare Other

## 2014-07-06 VITALS — BP 110/64 | HR 76 | Ht 64.25 in | Wt 214.0 lb

## 2014-07-06 DIAGNOSIS — R1032 Left lower quadrant pain: Secondary | ICD-10-CM

## 2014-07-06 DIAGNOSIS — R197 Diarrhea, unspecified: Secondary | ICD-10-CM

## 2014-07-06 DIAGNOSIS — D649 Anemia, unspecified: Secondary | ICD-10-CM

## 2014-07-06 LAB — CBC WITH DIFFERENTIAL/PLATELET
Basophils Absolute: 0 10*3/uL (ref 0.0–0.1)
Basophils Relative: 0.5 % (ref 0.0–3.0)
Eosinophils Absolute: 0.2 10*3/uL (ref 0.0–0.7)
Eosinophils Relative: 3 % (ref 0.0–5.0)
HCT: 34.3 % — ABNORMAL LOW (ref 36.0–46.0)
Hemoglobin: 10.9 g/dL — ABNORMAL LOW (ref 12.0–15.0)
Lymphocytes Relative: 28.8 % (ref 12.0–46.0)
Lymphs Abs: 2.4 10*3/uL (ref 0.7–4.0)
MCHC: 31.6 g/dL (ref 30.0–36.0)
MCV: 68.9 fl — ABNORMAL LOW (ref 78.0–100.0)
Monocytes Absolute: 0.6 10*3/uL (ref 0.1–1.0)
Monocytes Relative: 7.8 % (ref 3.0–12.0)
Neutro Abs: 5 10*3/uL (ref 1.4–7.7)
Neutrophils Relative %: 59.9 % (ref 43.0–77.0)
Platelets: 324 10*3/uL (ref 150.0–400.0)
RBC: 4.98 Mil/uL (ref 3.87–5.11)
RDW: 19.3 % — ABNORMAL HIGH (ref 11.5–15.5)
WBC: 8.3 10*3/uL (ref 4.0–10.5)

## 2014-07-06 MED ORDER — DICYCLOMINE HCL 20 MG PO TABS: 20.0000 mg | ORAL_TABLET | Freq: Three times a day (TID) | ORAL | Status: DC

## 2014-07-06 NOTE — Patient Instructions (Signed)
Your physician has requested that you go to the basement for the following lab work before leaving today: CBC.  We have sent the following medications to your pharmacy for you to pick up at your convenience: Bentyl.  Please follow up with Dr. Leone PayorGessner in 6 weeks.

## 2014-07-06 NOTE — Progress Notes (Signed)
    History of Present Illness: [name] followed by Dr. Stan Headarl Gessner was scheduled with me today in his absence. She relates episodes of left lower quadrant pain often occurring 15-30 minutes before bowel movement that are relieved with a bowel movement. She has had normal bowel movements alternating with urgent stools and occasional episodes of incontinence. She decreased her dose of metformin and the loose stools and incontinence has abated. She feels her symptoms were worsened after beginning iron. He was hospitalized in April for diverticular bleed. Imaging studies and blood work from that hospitalization were reviewed. She underwent EGD and colonoscopy in 06/2012 and dicyclomine was recommended at that time. At some point she stopped taking dicyclomine. She does not recall any side effects and does not recall if it was efficacious. Denies weight loss, constipation, change in stool caliber, melena, hematochezia, nausea, vomiting, dysphagia, reflux symptoms, chest pain.  Current Medications, Allergies, Past Medical History, Past Surgical History, Family History and Social History were [facility] electronic [facility].  Physical Exam: General: Well developed , well nourished, no acute distress Head: Normocephalic and atraumatic Eyes:  sclerae anicteric, EOMI Ears: Normal auditory acuity Mouth: No deformity or lesions Lungs: Clear throughout to auscultation Heart: Regular rate and rhythm; no murmurs, rubs or bruits Abdomen: Soft, mild lower bowel tenderness without rebound or guarding and non distended. No masses, hepatosplenomegaly or hernias noted. Normal Bowel sounds Musculoskeletal: Symmetrical with no gross deformities  Pulses:  Normal pulses noted Extremities: No clubbing, cyanosis, edema or deformities noted Neurological: Alert oriented x 4, grossly nonfocal Psychological:  Alert and cooperative. Normal mood and affect  Assessment and  Recommendations:  1. LLQ pain associated with bowel movements. Suspected IBS. Resume dicyclomine 20 mg 3 times a day taken a.c. Followup with Dr. Leone PayorGessner at about 6 weeks.  2. Diarrhea and incontinence. Symptoms resolved with dose reduction of metformin. She probably needs to discontinue metformin. She is advised to see her PCP regarding diabetes management.  3. Anemia following diverticular bleed. Hb=8.4 at discharge in April. Check CBC today. Discontinue iron if her hemoglobin has corrected.

## 2014-07-22 ENCOUNTER — Other Ambulatory Visit: Payer: Self-pay | Admitting: Internal Medicine

## 2014-08-01 ENCOUNTER — Other Ambulatory Visit: Payer: Self-pay | Admitting: Internal Medicine

## 2014-08-01 DIAGNOSIS — IMO0002 Reserved for concepts with insufficient information to code with codable children: Secondary | ICD-10-CM

## 2014-08-01 DIAGNOSIS — E1165 Type 2 diabetes mellitus with hyperglycemia: Secondary | ICD-10-CM

## 2014-08-01 DIAGNOSIS — E1149 Type 2 diabetes mellitus with other diabetic neurological complication: Secondary | ICD-10-CM

## 2014-08-08 ENCOUNTER — Other Ambulatory Visit: Payer: Self-pay | Admitting: Internal Medicine

## 2014-08-10 ENCOUNTER — Other Ambulatory Visit: Payer: Self-pay | Admitting: Internal Medicine

## 2014-08-10 DIAGNOSIS — I10 Essential (primary) hypertension: Secondary | ICD-10-CM

## 2014-09-06 ENCOUNTER — Ambulatory Visit: Payer: Medicare Other | Admitting: Internal Medicine

## 2014-09-09 ENCOUNTER — Other Ambulatory Visit: Payer: Self-pay

## 2014-09-09 ENCOUNTER — Encounter: Payer: Self-pay | Admitting: Internal Medicine

## 2014-09-09 DIAGNOSIS — K589 Irritable bowel syndrome without diarrhea: Secondary | ICD-10-CM

## 2014-09-09 NOTE — Progress Notes (Signed)
Patient ID: [name], female   DOB: 1946/12/09, 68 y.o.   MRN: [id number] Called patient and she forgot her appointment 09/06/14.  I told her Dr. Leone PayorGessner wants her to come by and get a CBC drawn.  Orders put in.

## 2014-09-14 ENCOUNTER — Other Ambulatory Visit: Payer: Self-pay | Admitting: Internal Medicine

## 2014-09-14 ENCOUNTER — Other Ambulatory Visit (INDEPENDENT_AMBULATORY_CARE_PROVIDER_SITE_OTHER): Payer: Medicare Other

## 2014-09-14 DIAGNOSIS — E1165 Type 2 diabetes mellitus with hyperglycemia: Secondary | ICD-10-CM

## 2014-09-14 DIAGNOSIS — IMO0002 Reserved for concepts with insufficient information to code with codable children: Secondary | ICD-10-CM

## 2014-09-14 DIAGNOSIS — E1149 Type 2 diabetes mellitus with other diabetic neurological complication: Secondary | ICD-10-CM

## 2014-09-14 DIAGNOSIS — K589 Irritable bowel syndrome without diarrhea: Secondary | ICD-10-CM

## 2014-09-14 LAB — CBC WITH DIFFERENTIAL/PLATELET
Basophils Absolute: 0.1 10*3/uL (ref 0.0–0.1)
Basophils Relative: 0.7 % (ref 0.0–3.0)
Eosinophils Absolute: 0.3 10*3/uL (ref 0.0–0.7)
Eosinophils Relative: 3.3 % (ref 0.0–5.0)
HCT: 34.5 % — ABNORMAL LOW (ref 36.0–46.0)
Hemoglobin: 11.1 g/dL — ABNORMAL LOW (ref 12.0–15.0)
Lymphocytes Relative: 26.5 % (ref 12.0–46.0)
Lymphs Abs: 2.6 10*3/uL (ref 0.7–4.0)
MCHC: 32.1 g/dL (ref 30.0–36.0)
MCV: 70.3 fl — ABNORMAL LOW (ref 78.0–100.0)
Monocytes Absolute: 0.7 10*3/uL (ref 0.1–1.0)
Monocytes Relative: 7 % (ref 3.0–12.0)
Neutro Abs: 6.2 10*3/uL (ref 1.4–7.7)
Neutrophils Relative %: 62.5 % (ref 43.0–77.0)
Platelets: 309 10*3/uL (ref 150.0–400.0)
RBC: 4.91 Mil/uL (ref 3.87–5.11)
RDW: 20.1 % — ABNORMAL HIGH (ref 11.5–15.5)
WBC: 9.9 10*3/uL (ref 4.0–10.5)

## 2014-09-15 ENCOUNTER — Other Ambulatory Visit: Payer: Self-pay

## 2014-09-15 DIAGNOSIS — D508 Other iron deficiency anemias: Secondary | ICD-10-CM

## 2014-09-15 MED ORDER — IRON 325 (65 FE) MG PO TABS: 1.0000 | ORAL_TABLET | Freq: Two times a day (BID) | ORAL | Status: DC

## 2014-09-15 NOTE — Progress Notes (Signed)
Quick Note:  Anemic with small RBC's - looks like iron-deficiency Needs ferrous sulfate 325 mg bid Repeat CBC 1 month ______

## 2014-10-12 ENCOUNTER — Encounter: Payer: Self-pay | Admitting: Internal Medicine

## 2014-10-12 NOTE — Telephone Encounter (Signed)
She needs appt this month [facility] - poorly controlled DM.

## 2014-10-17 ENCOUNTER — Other Ambulatory Visit: Payer: Self-pay | Admitting: Gastroenterology

## 2014-10-18 ENCOUNTER — Other Ambulatory Visit: Payer: Self-pay | Admitting: Gastroenterology

## 2014-11-19 ENCOUNTER — Other Ambulatory Visit: Payer: Self-pay | Admitting: Internal Medicine

## 2014-11-21 NOTE — Telephone Encounter (Signed)
Unable to reach patient so left detailed voice mail with lab hours and that we need to have the CBC drawn to refill her Bentyl.

## 2014-11-21 NOTE — Telephone Encounter (Signed)
May I refill her Bentyl Sir?  I called and left her a message that her lab work is over due, (CBC).

## 2014-11-21 NOTE — Telephone Encounter (Signed)
She is supposed to have a CBC drawn Then can refill

## 2014-11-22 ENCOUNTER — Other Ambulatory Visit (INDEPENDENT_AMBULATORY_CARE_PROVIDER_SITE_OTHER): Payer: Medicare Other

## 2014-11-22 DIAGNOSIS — D508 Other iron deficiency anemias: Secondary | ICD-10-CM

## 2014-11-22 LAB — CBC WITH DIFFERENTIAL/PLATELET
Basophils Absolute: 0.1 10*3/uL (ref 0.0–0.1)
Basophils Relative: 0.5 % (ref 0.0–3.0)
Eosinophils Absolute: 0.4 10*3/uL (ref 0.0–0.7)
Eosinophils Relative: 4 % (ref 0.0–5.0)
HCT: 34.3 % — ABNORMAL LOW (ref 36.0–46.0)
Hemoglobin: 11.2 g/dL — ABNORMAL LOW (ref 12.0–15.0)
Lymphocytes Relative: 30.4 % (ref 12.0–46.0)
Lymphs Abs: 2.9 10*3/uL (ref 0.7–4.0)
MCHC: 32.6 g/dL (ref 30.0–36.0)
MCV: 74.4 fl — ABNORMAL LOW (ref 78.0–100.0)
Monocytes Absolute: 0.8 10*3/uL (ref 0.1–1.0)
Monocytes Relative: 8.4 % (ref 3.0–12.0)
Neutro Abs: 5.4 10*3/uL (ref 1.4–7.7)
Neutrophils Relative %: 56.7 % (ref 43.0–77.0)
Platelets: 301 10*3/uL (ref 150.0–400.0)
RBC: 4.61 Mil/uL (ref 3.87–5.11)
RDW: 17.7 % — ABNORMAL HIGH (ref 11.5–15.5)
WBC: 9.5 10*3/uL (ref 4.0–10.5)

## 2014-11-22 NOTE — Progress Notes (Signed)
Quick Note:  Hgb about the same Is she taking ferrous sulfate? OK to refill dicyclomine x 2 ______

## 2014-11-23 ENCOUNTER — Other Ambulatory Visit: Payer: Self-pay

## 2014-11-23 MED ORDER — DICYCLOMINE HCL 20 MG PO TABS: 20.0000 mg | ORAL_TABLET | Freq: Three times a day (TID) | ORAL | Status: DC

## 2014-11-24 NOTE — Progress Notes (Signed)
Ask her to try ferrous gluconate

## 2014-12-19 ENCOUNTER — Emergency Department (HOSPITAL_BASED_OUTPATIENT_CLINIC_OR_DEPARTMENT_OTHER): Payer: Medicare Other

## 2014-12-19 ENCOUNTER — Encounter (HOSPITAL_BASED_OUTPATIENT_CLINIC_OR_DEPARTMENT_OTHER): Payer: Self-pay | Admitting: Emergency Medicine

## 2014-12-19 ENCOUNTER — Inpatient Hospital Stay (HOSPITAL_COMMUNITY): Payer: Medicare Other

## 2014-12-19 ENCOUNTER — Inpatient Hospital Stay (HOSPITAL_BASED_OUTPATIENT_CLINIC_OR_DEPARTMENT_OTHER)
Admission: EM | Admit: 2014-12-19 | Discharge: 2014-12-22 | DRG: 684 | Disposition: A | Discharge status: Home / Self Care | Payer: Medicare Other | Attending: Internal Medicine | Admitting: Internal Medicine

## 2014-12-19 DIAGNOSIS — R531 Weakness: Secondary | ICD-10-CM | POA: Diagnosis not present

## 2014-12-19 DIAGNOSIS — R110 Nausea: Secondary | ICD-10-CM | POA: Diagnosis present

## 2014-12-19 DIAGNOSIS — S299XXA Unspecified injury of thorax, initial encounter: Secondary | ICD-10-CM | POA: Diagnosis not present

## 2014-12-19 DIAGNOSIS — Z90710 Acquired absence of both cervix and uterus: Secondary | ICD-10-CM | POA: Diagnosis not present

## 2014-12-19 DIAGNOSIS — K219 Gastro-esophageal reflux disease without esophagitis: Secondary | ICD-10-CM | POA: Diagnosis not present

## 2014-12-19 DIAGNOSIS — M25552 Pain in left hip: Secondary | ICD-10-CM | POA: Diagnosis present

## 2014-12-19 DIAGNOSIS — Z9049 Acquired absence of other specified parts of digestive tract: Secondary | ICD-10-CM | POA: Diagnosis present

## 2014-12-19 DIAGNOSIS — Z23 Encounter for immunization: Secondary | ICD-10-CM

## 2014-12-19 DIAGNOSIS — R197 Diarrhea, unspecified: Secondary | ICD-10-CM | POA: Diagnosis present

## 2014-12-19 DIAGNOSIS — Z90722 Acquired absence of ovaries, bilateral: Secondary | ICD-10-CM | POA: Diagnosis not present

## 2014-12-19 DIAGNOSIS — Z8249 Family history of ischemic heart disease and other diseases of the circulatory system: Secondary | ICD-10-CM

## 2014-12-19 DIAGNOSIS — I10 Essential (primary) hypertension: Secondary | ICD-10-CM | POA: Diagnosis not present

## 2014-12-19 DIAGNOSIS — I872 Venous insufficiency (chronic) (peripheral): Secondary | ICD-10-CM | POA: Diagnosis not present

## 2014-12-19 DIAGNOSIS — N179 Acute kidney failure, unspecified: Secondary | ICD-10-CM | POA: Diagnosis not present

## 2014-12-19 DIAGNOSIS — R55 Syncope and collapse: Secondary | ICD-10-CM

## 2014-12-19 DIAGNOSIS — W19XXXA Unspecified fall, initial encounter: Secondary | ICD-10-CM | POA: Diagnosis not present

## 2014-12-19 DIAGNOSIS — I959 Hypotension, unspecified: Secondary | ICD-10-CM | POA: Diagnosis not present

## 2014-12-19 DIAGNOSIS — E1142 Type 2 diabetes mellitus with diabetic polyneuropathy: Secondary | ICD-10-CM | POA: Diagnosis present

## 2014-12-19 DIAGNOSIS — Z833 Family history of diabetes mellitus: Secondary | ICD-10-CM

## 2014-12-19 DIAGNOSIS — E860 Dehydration: Secondary | ICD-10-CM | POA: Diagnosis present

## 2014-12-19 DIAGNOSIS — R296 Repeated falls: Secondary | ICD-10-CM | POA: Diagnosis not present

## 2014-12-19 DIAGNOSIS — M25562 Pain in left knee: Secondary | ICD-10-CM | POA: Diagnosis not present

## 2014-12-19 DIAGNOSIS — Z79899 Other long term (current) drug therapy: Secondary | ICD-10-CM | POA: Diagnosis not present

## 2014-12-19 DIAGNOSIS — E785 Hyperlipidemia, unspecified: Secondary | ICD-10-CM | POA: Diagnosis present

## 2014-12-19 DIAGNOSIS — F329 Major depressive disorder, single episode, unspecified: Secondary | ICD-10-CM | POA: Diagnosis present

## 2014-12-19 DIAGNOSIS — Z885 Allergy status to narcotic agent status: Secondary | ICD-10-CM | POA: Diagnosis not present

## 2014-12-19 DIAGNOSIS — R51 Headache: Secondary | ICD-10-CM | POA: Diagnosis present

## 2014-12-19 DIAGNOSIS — R42 Dizziness and giddiness: Secondary | ICD-10-CM | POA: Diagnosis not present

## 2014-12-19 DIAGNOSIS — Z794 Long term (current) use of insulin: Secondary | ICD-10-CM

## 2014-12-19 DIAGNOSIS — E118 Type 2 diabetes mellitus with unspecified complications: Secondary | ICD-10-CM | POA: Diagnosis not present

## 2014-12-19 HISTORY — DX: Acute kidney failure, unspecified: N17.9

## 2014-12-19 LAB — URINALYSIS, ROUTINE W REFLEX MICROSCOPIC
Glucose, UA: NEGATIVE mg/dL
Hgb urine dipstick: NEGATIVE
Ketones, ur: NEGATIVE mg/dL
Nitrite: NEGATIVE
Protein, ur: NEGATIVE mg/dL
Specific Gravity, Urine: 1.013 (ref 1.005–1.030)
Urobilinogen, UA: 0.2 mg/dL (ref 0.0–1.0)
pH: 5 (ref 5.0–8.0)

## 2014-12-19 LAB — I-STAT TROPONIN, ED: Troponin i, poc: 0 ng/mL (ref 0.00–0.08)

## 2014-12-19 LAB — CREATININE, SERUM
Creatinine, Ser: 2.73 mg/dL — ABNORMAL HIGH (ref 0.50–1.10)
GFR calc Af Amer: 20 mL/min — ABNORMAL LOW (ref >90)
GFR calc non Af Amer: 17 mL/min — ABNORMAL LOW (ref >90)

## 2014-12-19 LAB — CBC
HCT: 36.6 % (ref 36.0–46.0)
HCT: 34.7 % — ABNORMAL LOW (ref 36.0–46.0)
Hemoglobin: 11.6 g/dL — ABNORMAL LOW (ref 12.0–15.0)
Hemoglobin: 11.1 g/dL — ABNORMAL LOW (ref 12.0–15.0)
MCH: 25.1 pg — ABNORMAL LOW (ref 26.0–34.0)
MCH: 25.3 pg — ABNORMAL LOW (ref 26.0–34.0)
MCHC: 31.7 g/dL (ref 30.0–36.0)
MCHC: 32 g/dL (ref 30.0–36.0)
MCV: 79 fL (ref 78.0–100.0)
MCV: 79 fL (ref 78.0–100.0)
Platelets: 289 10*3/uL (ref 150–400)
Platelets: 246 10*3/uL (ref 150–400)
RBC: 4.63 MIL/uL (ref 3.87–5.11)
RBC: 4.39 MIL/uL (ref 3.87–5.11)
RDW: 17.5 % — ABNORMAL HIGH (ref 11.5–15.5)
RDW: 17 % — ABNORMAL HIGH (ref 11.5–15.5)
WBC: 11.7 10*3/uL — ABNORMAL HIGH (ref 4.0–10.5)
WBC: 9.6 10*3/uL (ref 4.0–10.5)

## 2014-12-19 LAB — RAPID URINE DRUG SCREEN, HOSP PERFORMED
Amphetamines: NOT DETECTED
Barbiturates: NOT DETECTED
Benzodiazepines: NOT DETECTED
Cocaine: NOT DETECTED
Opiates: NOT DETECTED
Tetrahydrocannabinol: NOT DETECTED

## 2014-12-19 LAB — URINE MICROSCOPIC-ADD ON

## 2014-12-19 LAB — COMPREHENSIVE METABOLIC PANEL
ALT: 28 U/L (ref 0–35)
AST: 21 U/L (ref 0–37)
Albumin: 3.9 g/dL (ref 3.5–5.2)
Alkaline Phosphatase: 98 U/L (ref 39–117)
Anion gap: 12 (ref 5–15)
BUN: 56 mg/dL — ABNORMAL HIGH (ref 6–23)
CO2: 24 mmol/L (ref 19–32)
Calcium: 8.7 mg/dL (ref 8.4–10.5)
Chloride: 95 mEq/L — ABNORMAL LOW (ref 96–112)
Creatinine, Ser: 3.92 mg/dL — ABNORMAL HIGH (ref 0.50–1.10)
GFR calc Af Amer: 13 mL/min — ABNORMAL LOW (ref >90)
GFR calc non Af Amer: 11 mL/min — ABNORMAL LOW (ref >90)
Glucose, Bld: 200 mg/dL — ABNORMAL HIGH (ref 70–99)
Potassium: 4.4 mmol/L (ref 3.5–5.1)
Sodium: 131 mmol/L — ABNORMAL LOW (ref 135–145)
Total Bilirubin: 0.3 mg/dL (ref 0.3–1.2)
Total Protein: 7.4 g/dL (ref 6.0–8.3)

## 2014-12-19 LAB — DIFFERENTIAL
Basophils Absolute: 0.1 10*3/uL (ref 0.0–0.1)
Basophils Relative: 1 % (ref 0–1)
Eosinophils Absolute: 0.3 10*3/uL (ref 0.0–0.7)
Eosinophils Relative: 2 % (ref 0–5)
Lymphocytes Relative: 26 % (ref 12–46)
Lymphs Abs: 3.1 10*3/uL (ref 0.7–4.0)
Monocytes Absolute: 1.1 10*3/uL — ABNORMAL HIGH (ref 0.1–1.0)
Monocytes Relative: 10 % (ref 3–12)
Neutro Abs: 7.2 10*3/uL (ref 1.7–7.7)
Neutrophils Relative %: 62 % (ref 43–77)

## 2014-12-19 LAB — PROTIME-INR
INR: 1 (ref 0.00–1.49)
Prothrombin Time: 13.2 seconds (ref 11.6–15.2)

## 2014-12-19 LAB — TSH: TSH: 1.306 u[IU]/mL (ref 0.350–4.500)

## 2014-12-19 LAB — GLUCOSE, CAPILLARY
Glucose-Capillary: 154 mg/dL — ABNORMAL HIGH (ref 70–99)
Glucose-Capillary: 177 mg/dL — ABNORMAL HIGH (ref 70–99)
Glucose-Capillary: 189 mg/dL — ABNORMAL HIGH (ref 70–99)

## 2014-12-19 LAB — ETHANOL: Alcohol, Ethyl (B): <5 mg/dL (ref 0–9)

## 2014-12-19 LAB — APTT: aPTT: 31 seconds (ref 24–37)

## 2014-12-19 LAB — URIC ACID: Uric Acid, Serum: 8.9 mg/dL — ABNORMAL HIGH (ref 2.4–7.0)

## 2014-12-19 LAB — LACTIC ACID, PLASMA: Lactic Acid, Venous: 1.6 mmol/L (ref 0.5–2.2)

## 2014-12-19 MED ORDER — HEPARIN SODIUM (PORCINE) 5000 UNIT/ML IJ SOLN
5000.0000 [IU] | Freq: Three times a day (TID) | INTRAMUSCULAR | Status: DC
  Administered 2014-12-19 – 2014-12-22 (×9): 5000 [IU] via SUBCUTANEOUS
  Filled 2014-12-19 (×10): qty 1

## 2014-12-19 MED ORDER — HYDROMORPHONE HCL 1 MG/ML IJ SOLN
0.5000 mg | INTRAMUSCULAR | Status: DC | PRN
  Administered 2014-12-19 – 2014-12-20 (×3): 0.5 mg via INTRAVENOUS
  Filled 2014-12-19 (×2): qty 1

## 2014-12-19 MED ORDER — PNEUMOCOCCAL VAC POLYVALENT 25 MCG/0.5ML IJ INJ
0.5000 mL | INJECTION | INTRAMUSCULAR | Status: AC
  Administered 2014-12-20: 0.5 mL via INTRAMUSCULAR
  Filled 2014-12-19: qty 0.5

## 2014-12-19 MED ORDER — SODIUM CHLORIDE 0.9 % IV SOLN
INTRAVENOUS | Status: AC
Start: 2014-12-19 — End: 2014-12-20
  Administered 2014-12-19: 13:00:00 via INTRAVENOUS

## 2014-12-19 MED ORDER — INFLUENZA VAC SPLIT QUAD 0.5 ML IM SUSY
0.5000 mL | PREFILLED_SYRINGE | INTRAMUSCULAR | Status: AC
  Administered 2014-12-20: 0.5 mL via INTRAMUSCULAR
  Filled 2014-12-19: qty 0.5

## 2014-12-19 MED ORDER — GABAPENTIN 300 MG PO CAPS
300.0000 mg | ORAL_CAPSULE | Freq: Three times a day (TID) | ORAL | Status: DC
  Administered 2014-12-19 – 2014-12-22 (×9): 300 mg via ORAL
  Filled 2014-12-19 (×12): qty 1

## 2014-12-19 MED ORDER — CETYLPYRIDINIUM CHLORIDE 0.05 % MT LIQD: 7.0000 mL | Freq: Two times a day (BID) | OROMUCOSAL | Status: DC

## 2014-12-19 MED ORDER — INSULIN ASPART 100 UNIT/ML ~~LOC~~ SOLN
0.0000 [IU] | Freq: Three times a day (TID) | SUBCUTANEOUS | Status: DC
  Administered 2014-12-19 – 2014-12-20 (×2): 3 [IU] via SUBCUTANEOUS
  Administered 2014-12-20: 5 [IU] via SUBCUTANEOUS
  Administered 2014-12-20 – 2014-12-21 (×4): 3 [IU] via SUBCUTANEOUS
  Administered 2014-12-22: 5 [IU] via SUBCUTANEOUS
  Administered 2014-12-22: 3 [IU] via SUBCUTANEOUS

## 2014-12-19 MED ORDER — SODIUM CHLORIDE 0.9 % IV BOLUS (SEPSIS)
1000.0000 mL | Freq: Once | INTRAVENOUS | Status: AC
  Administered 2014-12-19: 1000 mL via INTRAVENOUS

## 2014-12-19 MED ORDER — HYDROMORPHONE HCL 1 MG/ML IJ SOLN
INTRAMUSCULAR | Status: AC
Start: 2014-12-19 — End: 2014-12-19
  Filled 2014-12-19: qty 1

## 2014-12-19 MED ORDER — PANTOPRAZOLE SODIUM 40 MG PO TBEC
40.0000 mg | DELAYED_RELEASE_TABLET | Freq: Every day | ORAL | Status: DC
  Administered 2014-12-19 – 2014-12-22 (×4): 40 mg via ORAL
  Filled 2014-12-19 (×5): qty 1

## 2014-12-19 NOTE — ED Notes (Signed)
Unable to void, specimen not collected.

## 2014-12-19 NOTE — H&P (Signed)
History and Physical  Vonette Grosso ZOX:[id number] DOB: 1946-10-30 DOA: 12/19/2014  Referring physician: [facility] PCP: [name], MD   Chief Complaint: falls  HPI: [name] is a 69 y.o. female sent to [facility] by family due to multiple falls yesterday, generalized weakness x1wk. She fell on left sided c/o left hip and left knee pain with x ray done at [facility] showed no acute injuries. Ct head negative. However, she was found to be in acute renal failure with cr of 3.92 while baseline in 04/2014 is 0.8. She received ivf and sent to [facility] for admission. She denies chest pain, no n/v, no ab pain, no urinary symptoms, no recent change of meds, reported good appetite with stable weight. Does use naproxen, but only on occasional basis. She reported chronic diarrhea since 04/2014. No fever. No recent abx use. She does takes care of several of her close friends who has been chronically ill.   Review of Systems:  Details per HPI, Review of systems are otherwise negative  Past Medical History  Diagnosis Date  . GERD (gastroesophageal reflux disease)   . Hyperlipidemia   . Hypertension   . Diabetes mellitus     type II  . Depression   . Peripheral neuropathy   . Venous insufficiency   . Achilles tendinitis   . Right shoulder pain     Subacromial tendinitis  . Calcaneal spur     right  . Ventral hernia   . Cataract   . Diverticulosis of colon (without mention of hemorrhage) 2013  . Internal hemorrhoids 2013  . GI bleed 03/26/14  . Acute renal failure (ARF) 12/19/2014   Past Surgical History  Procedure Laterality Date  . Cholecystectomy  2001  . Abdominal hysterectomy  2001    TAH-BSO  . Incisional hernia repair    . Tonsillectomy    . Esophagogastroduodenoscopy  2013    normal   . Colonoscopy  2013    diverticulosis    Social History:  reports that she has never smoked. She has never used smokeless tobacco. She reports that she does not drink alcohol  or use illicit drugs. Patient lives at home with significant other who has lung cancer, is able to participate in activities of daily living.  Allergies  Allergen Reactions  . Morphine Other (See Comments)    'took me out of this world' I had to be resuscitated  . Codeine Sulfate Other (See Comments)    GI upset and pain  . Demerol [Meperidine] Nausea And Vomiting  . Meperidine Hcl Nausea And Vomiting and Swelling  . Propoxyphene Hcl Other (See Comments)    Darvocet caused sick headache    Family History  Problem Relation Age of Onset  . Heart failure Father     Died in 84s  . Colon cancer Neg Hx   . Ovarian cancer Sister   . Diabetes Sister   . Diabetes Paternal Grandmother   . Heart failure Mother     Died in 59s  . CVA Father       Prior to Admission medications   Medication Sig Start Date End Date Taking? Authorizing Provider  dicyclomine (BENTYL) 20 MG tablet Take 1 tablet (20 mg total) by mouth 3 (three) times daily before meals. 11/23/14  Yes Iva Boop, MD  esomeprazole (NEXIUM) 20 MG capsule Take 20 mg by mouth daily at 12 noon.   Yes Historical Provider, MD  Ferrous Sulfate (IRON) 325 (65 FE) MG  TABS Take 1 tablet by mouth 2 (two) times daily. Patient taking differently: Take 1 tablet by mouth daily.  09/15/14  Yes Iva Booparl E Gessner, MD  gabapentin (NEURONTIN) 300 MG capsule Take 1 capsule (300 mg total) by mouth 3 (three) times daily. 09/14/14  Yes Rocco SereneLawrence D Klima, MD  ibuprofen (ADVIL,MOTRIN) 200 MG tablet Take 800 mg by mouth every 6 (six) hours as needed (pain).   Yes Historical Provider, MD  insulin aspart (NOVOLOG) 100 UNIT/ML injection Take 15 units with breakfast and lunch, 20 units with night time meal. Patient taking differently: Inject 10-12 Units into the skin 3 (three) times daily with meals. Inject 10 units with each meal, unless meal includes starch, then inject 12 units 03/01/14  Yes Judie BonusElizabeth A Kollar, MD  insulin glargine (LANTUS) 100 UNIT/ML  injection Inject 70 Units into the skin at bedtime.   Yes Historical Provider, MD  lisinopril-hydrochlorothiazide (PRINZIDE,ZESTORETIC) 20-25 MG per tablet TAKE 1 TABLET BY MOUTH DAILY. 10/12/14  Yes Burns SpainElizabeth A Butcher, MD  metFORMIN (GLUCOPHAGE) 1000 MG tablet Take 0.5 tablets (500 mg) in the morning and 1 tablet (1000 mg) in the evening Patient taking differently: Take 500 mg by mouth 3 (three) times daily with meals.  08/02/14  Yes Rocco SereneLawrence D Klima, MD  sodium chloride (OCEAN) 0.65 % SOLN nasal spray Place 1 spray into both nostrils 4 (four) times daily. Patient taking differently: Place 1 spray into both nostrils as needed for congestion.  04/06/14  Yes Ky BarbanSolianny D Kennerly, MD  Trolamine Salicylate (ASPERCREME EX) Apply 1 application topically daily as needed (shoulder/hips/knee/knuckles).   Yes Historical Provider, MD  ferrous gluconate (FERGON) 324 MG tablet Take 1 tablet (324 mg total) by mouth daily with breakfast. Patient not taking: Reported on 12/19/2014 04/09/14   Ky BarbanSolianny D Kennerly, MD  fluticasone (FLONASE) 50 MCG/ACT nasal spray Place 1 spray into both nostrils daily. Patient not taking: Reported on 12/19/2014 04/06/14   Ky BarbanSolianny D Kennerly, MD    Physical Exam: BP 154/117 mmHg  Pulse 81  Temp(Src) 97.4 F (36.3 C) (Oral)  Resp 18  Ht 5\' 4"  (1.626 m)  Wt 96.163 kg (212 lb)  BMI 36.37 kg/m2  SpO2 100%  General:  Aaox3, nad Eyes: perrl ENT: unremarkable Neck: supple Cardiovascular: rrr, no murmur Respiratory: CTABL Abdomen: soft/NT/ND, positive bowel sounds Skin: no rash Musculoskeletal: no edema, tender to palpation left hip/left knee  Psychiatric: anxious Neurologic: nonfocal          Labs on Admission:  Basic Metabolic Panel:  Recent Labs Lab 12/19/14 0810 12/19/14 1404  NA 131*  --   K 4.4  --   CL 95*  --   CO2 24  --   GLUCOSE 200*  --   BUN 56*  --   CREATININE 3.92* 2.73*  CALCIUM 8.7  --    Liver Function Tests:  Recent Labs Lab 12/19/14 0810    AST 21  ALT 28  ALKPHOS 98  BILITOT 0.3  PROT 7.4  ALBUMIN 3.9   No results for input(s): LIPASE, AMYLASE in the last 168 hours. No results for input(s): AMMONIA in the last 168 hours. CBC:  Recent Labs Lab 12/19/14 0810 12/19/14 1404  WBC 11.7* 9.6  NEUTROABS 7.2  --   HGB 11.6* 11.1*  HCT 36.6 34.7*  MCV 79.0 79.0  PLT 289 246   Cardiac Enzymes: No results for input(s): CKTOTAL, CKMB, CKMBINDEX, TROPONINI in the last 168 hours.  BNP (last 3 results) No results for input(s): PROBNP  in the last 8760 hours. CBG:  Recent Labs Lab 12/19/14 1157  GLUCAP 154*    Radiological Exams on Admission: Dg Chest 2 View  12/19/2014   CLINICAL DATA:  Fall, dizziness.  Left hip pain.  EXAM: CHEST  2 VIEW  COMPARISON:  03/29/2014.  FINDINGS: Trachea is midline. Heart size normal. Lungs are clear. No pleural fluid. No pneumothorax.  IMPRESSION: No acute findings.   Electronically Signed   By: Leanna Battles M.D.   On: 12/19/2014 08:55   Ct Head Wo Contrast  12/19/2014   CLINICAL DATA:  Dizziness beginning at 6:00 p.m. on 12/18/2014. Status post fall times four. Initial encounter.  EXAM: CT HEAD WITHOUT CONTRAST  TECHNIQUE: Contiguous axial images were obtained from the base of the skull through the vertex without intravenous contrast.  COMPARISON:  None.  FINDINGS: There is no evidence of acute intracranial abnormality including hemorrhage, infarct, mass lesion, mass effect, midline shift or abnormal extra-axial fluid collection. Mild atrophy and chronic microvascular ischemic change are identified. The calvarium is intact. Imaged paranasal sinuses and mastoid air cells are clear.  IMPRESSION: No acute finding.   Electronically Signed   By: Drusilla Kanner M.D.   On: 12/19/2014 08:38   Dg Knee Complete 4 Views Left  12/19/2014   CLINICAL DATA:  Status post multiple falls last night now with left hip pain and left posterior medial knee pain  EXAM: LEFT KNEE - COMPLETE 4+ VIEW  COMPARISON:   None.  FINDINGS: The bones of the knee are adequately mineralized. There are large osteophytes associated with the tibial spines and the intercondylar notch. There is no femoral condyle nor tibial plateau fracture. The proximal fibula is intact. The medial and lateral joint compartments are reasonably well maintained. There is a large osteophyte from the superior margin of the patella. No joint effusion is evident.  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 There is no acute fracture nor dislocation.   Electronically Signed   By: David  [place]   On: 12/19/2014 08:57   Dg Hip Unilat With Pelvis Min 4 Views Left  12/19/2014   CLINICAL DATA:  Status post multiple falls last evening now with left hip pain.  EXAM: DG HIP W/ PWLVIS 4+V*L*  COMPARISON:  None.  FINDINGS: AP and lateral views of the left hip reveal the joint space to be preserved. There is no acute fracture nor dislocation. The femoral neck and intertrochanteric and subtrochanteric regions are normal. The observed portions of the bony pelvis are unremarkable.  IMPRESSION: There is no acute or significant chronic bony abnormality of the left hip.   Electronically Signed   By: David  [place]   On: 12/19/2014 08:58    EKG: Independently reviewed. NSR, no acute ST/T changes.  Assessment/Plan Present on Admission:  . Acute renal failure . ARF (acute renal failure)  Syncope/falls: likely due to volume depletion, will check orthostatic vital signs, ordered lactic acid/blood culture to r/o underline infection. Does have low bp , initial mild leukocytosis, though all could be volume depletion. Continue tele to r/o arrythmia.  ARF, unclear etiology. ua pending, will also order renal ultrasounds. Hydration, avoid nephrotoxin, hold lisinopril/hctz/metformin. D/c ibuprofen. Hold scheduled insulin.   Diarrhea: reported chronic, will check c diff.   IDDM2, hold scheduled insulin in the setting of arf, start  SSI  Heparin sub q for dvt prophylaxis  Consultants: none  Code Status: full  Family Communication: patient  Disposition Plan: inpatient admission  Time spent: >58mins  Draven Laine,FangMD/PhD [facility] Pager [phone]  If 7PM-7AM, please contact night-coverage at [email or web address], password [facility]

## 2014-12-19 NOTE — ED Notes (Signed)
Attempted IV access x 2 in left forearm unsuccessful.

## 2014-12-19 NOTE — Progress Notes (Signed)
Patient presents with headache dizziness, has fallen 4 times, found to have acute renal failure. Accepted to telemetry.  Belkys Regalado, Md.

## 2014-12-19 NOTE — ED Notes (Signed)
Patient transported to CT 

## 2014-12-19 NOTE — ED Notes (Signed)
Pt states started with dizziness at around 6pm last night. Fell 4 times between 6p-11p. Pt states she did hit head several times. Pt complaining of increased headache, difficulty walking. Pt alert and oriented X4

## 2014-12-19 NOTE — ED Notes (Signed)
Updated receiving nurse Revonda StandardAllison about changes in status at this time

## 2014-12-19 NOTE — ED Provider Notes (Addendum)
CSN: [id number]     Arrival date & time 12/19/14  3086 History   First MD Initiated Contact with Patient 12/19/14 0745     Chief Complaint  Patient presents with  . Dizziness     (Consider location/radiation/quality/duration/timing/severity/associated sxs/prior Treatment) HPI Comments: Patient states for approximately 1 week she's had a mild headache and occasional dizziness that she describes as a sensation of things moving and is better if she remains still. However at 6 PM last night patient stood up to do something and states she fell. She describes it as a constant pressure on the front of her head take someone's pushing her backwards. She had 4 other falls between 6 PM and 11 PM. She denies any lightheadedness, syncope, loss of consciousness after the fall. She does admit to hitting her head slightly during 2 of the falls. She denies taking any blood thinners. Since 11 PM she has been using a wheelchair to get around so that she has had no other falls. She denies having any sensation of vertigo or dizziness currently. She only complains of a mild headache as a pressure in her for head like someone's pushing her backwards. She denies any focal weakness, visual changes, speech or swallowing difficulty. No chest pain, shortness of breath, GI bleeding. No recent medication changes.  Patient is a 69 y.o. female presenting with dizziness. The history is provided by the patient.  Dizziness Quality:  Imbalance (pt states feels like things are moving but not spinning) Severity:  Moderate Onset quality:  Gradual Duration:  1 week Timing:  Intermittent Progression:  Worsening Chronicity:  New Context: bending over and head movement   Relieved by:  Being still Worsened by:  Movement and standing up Associated symptoms: headaches   Associated symptoms: no blood in stool, no chest pain, no nausea, no palpitations, no shortness of breath, no syncope, no vomiting and no weakness   Risk factors: no hx  of stroke, no hx of vertigo and no new medications   Risk factors comment:  Hx of DM, HTN   Past Medical History  Diagnosis Date  . GERD (gastroesophageal reflux disease)   . Hyperlipidemia   . Hypertension   . Diabetes mellitus     type II  . Depression   . Peripheral neuropathy   . Venous insufficiency   . Achilles tendinitis   . Right shoulder pain     Subacromial tendinitis  . Calcaneal spur     right  . Ventral hernia   . Cataract   . Diverticulosis of colon (without mention of hemorrhage) 2013  . Internal hemorrhoids 2013  . GI bleed 03/26/14   Past Surgical History  Procedure Laterality Date  . Cholecystectomy  2001  . Abdominal hysterectomy  2001    TAH-BSO  . Incisional hernia repair    . Tonsillectomy    . Esophagogastroduodenoscopy  2013    normal   . Colonoscopy  2013    diverticulosis    Family History  Problem Relation Age of Onset  . Heart failure Father     Died in 67s  . Colon cancer Neg Hx   . Ovarian cancer Sister   . Diabetes Sister   . Diabetes Paternal Grandmother   . Heart failure Mother     Died in 23s  . CVA Father    History  Substance Use Topics  . Smoking status: Never Smoker   . Smokeless tobacco: Never Used  . Alcohol Use: No   OB  History    No data available     Review of Systems  Constitutional: Negative for fever.  Respiratory: Negative for cough, chest tightness and shortness of breath.   Cardiovascular: Negative for chest pain, palpitations and syncope.  Gastrointestinal: Negative for nausea, vomiting, abdominal pain and blood in stool.  Musculoskeletal:       Knee and hip pain on the left after falls  Neurological: Positive for dizziness and headaches. Negative for syncope, weakness and light-headedness.  All other systems reviewed and are negative.     Allergies  Morphine; Codeine sulfate; Demerol; Meperidine hcl; and Propoxyphene hcl  Home Medications   Prior to Admission medications   Medication Sig  Start Date End Date Taking? Authorizing Provider  dicyclomine (BENTYL) 20 MG tablet Take 1 tablet (20 mg total) by mouth 3 (three) times daily before meals. 11/23/14   Iva Boop, MD  esomeprazole (NEXIUM) 40 MG capsule Take 40 mg by mouth daily before breakfast.    Historical Provider, MD  ferrous gluconate (FERGON) 324 MG tablet Take 1 tablet (324 mg total) by mouth daily with breakfast. 04/09/14   Ky Barban, MD  Ferrous Sulfate (IRON) 325 (65 FE) MG TABS Take 1 tablet by mouth 2 (two) times daily. 09/15/14   Iva Boop, MD  fluticasone (FLONASE) 50 MCG/ACT nasal spray Place 1 spray into both nostrils daily. 04/06/14   Ky Barban, MD  gabapentin (NEURONTIN) 300 MG capsule Take 1 capsule (300 mg total) by mouth 3 (three) times daily. 09/14/14   Rocco Serene, MD  insulin aspart (NOVOLOG) 100 UNIT/ML injection Take 15 units with breakfast and lunch, 20 units with night time meal. 03/01/14   [name], MD  insulin glargine (LANTUS) 100 UNIT/ML injection Inject 70 Units into the skin at bedtime.    Historical Provider, MD  lisinopril-hydrochlorothiazide (PRINZIDE,ZESTORETIC) 20-25 MG per tablet TAKE 1 TABLET BY MOUTH DAILY. 10/12/14   Burns Spain, MD  metFORMIN (GLUCOPHAGE) 1000 MG tablet Take 0.5 tablets (500 mg) in the morning and 1 tablet (1000 mg) in the evening 08/02/14   Rocco Serene, MD  sodium chloride (OCEAN) 0.65 % SOLN nasal spray Place 1 spray into both nostrils 4 (four) times daily. 04/06/14   Ky Barban, MD  Trolamine Salicylate (ASPERCREME EX) Apply 1 application topically daily as needed (shoulder/hips/knee/knuckles).    Historical Provider, MD   BP 120/62 mmHg  Pulse 90  Temp(Src) 98 F (36.7 C) (Oral)  Resp 20  Ht 5\' 4"  (1.626 m)  Wt 212 lb (96.163 kg)  BMI 36.37 kg/m2  SpO2 97% Physical Exam  Constitutional: She is oriented to person, place, and time. She appears well-developed and well-nourished. No distress.  HENT:  Head:  Normocephalic and atraumatic.  Right Ear: Tympanic membrane and ear canal normal.  Left Ear: Tympanic membrane and ear canal normal.  Eyes: EOM are normal. Pupils are equal, round, and reactive to light.  Neck: Normal range of motion. Neck supple. No JVD present. Carotid bruit is not present.  Cardiovascular: Normal rate, regular rhythm, normal heart sounds and intact distal pulses.  Exam reveals no friction rub.   No murmur heard. Pulmonary/Chest: Effort normal and breath sounds normal. She has no wheezes. She has no rales.  Abdominal: Soft. Bowel sounds are normal. She exhibits no distension. There is no tenderness. There is no rebound and no guarding.  Musculoskeletal: She exhibits tenderness.       Left hip: She exhibits decreased range  of motion and tenderness. She exhibits no bony tenderness and no deformity.       Left knee: She exhibits decreased range of motion, swelling and ecchymosis. She exhibits no deformity. Tenderness found. Medial joint line and patellar tendon tenderness noted.  No edema  Neurological: She is alert and oriented to person, place, and time. She has normal strength. No cranial nerve deficit or sensory deficit. Coordination normal.  Normal finger to nose.  No visual field cuts.  Pt unable to ambulate due to knee pain from fall  Skin: Skin is warm and dry. No rash noted.  Psychiatric: She has a normal mood and affect. Her behavior is normal.  Nursing note and vitals reviewed.   ED Course  Procedures (including critical care time) Labs Review Labs Reviewed  CBC - Abnormal; Notable for the following:    WBC 11.7 (*)    Hemoglobin 11.6 (*)    MCH 25.1 (*)    RDW 17.5 (*)    All other components within normal limits  DIFFERENTIAL - Abnormal; Notable for the following:    Monocytes Absolute 1.1 (*)    All other components within normal limits  COMPREHENSIVE METABOLIC PANEL - Abnormal; Notable for the following:    Sodium 131 (*)    Chloride 95 (*)     Glucose, Bld 200 (*)    BUN 56 (*)    Creatinine, Ser 3.92 (*)    GFR calc non Af Amer 11 (*)    GFR calc Af Amer 13 (*)    All other components within normal limits  ETHANOL  PROTIME-INR  APTT  URINE RAPID DRUG SCREEN (HOSP PERFORMED)  URINALYSIS, ROUTINE W REFLEX MICROSCOPIC  I-STAT TROPOININ, ED    Imaging Review Dg Chest 2 View  12/19/2014   CLINICAL DATA:  Fall, dizziness.  Left hip pain.  EXAM: CHEST  2 VIEW  COMPARISON:  03/29/2014.  FINDINGS: Trachea is midline. Heart size normal. Lungs are clear. No pleural fluid. No pneumothorax.  IMPRESSION: No acute findings.   Electronically Signed   By: Leanna Battles M.D.   On: 12/19/2014 08:55   Ct Head Wo Contrast  12/19/2014   CLINICAL DATA:  Dizziness beginning at 6:00 p.m. on 12/18/2014. Status post fall times four. Initial encounter.  EXAM: CT HEAD WITHOUT CONTRAST  TECHNIQUE: Contiguous axial images were obtained from the base of the skull through the vertex without intravenous contrast.  COMPARISON:  None.  FINDINGS: There is no evidence of acute intracranial abnormality including hemorrhage, infarct, mass lesion, mass effect, midline shift or abnormal extra-axial fluid collection. Mild atrophy and chronic microvascular ischemic change are identified. The calvarium is intact. Imaged paranasal sinuses and mastoid air cells are clear.  IMPRESSION: No acute finding.   Electronically Signed   By: Drusilla Kanner M.D.   On: 12/19/2014 08:38   Dg Knee Complete 4 Views Left  12/19/2014   CLINICAL DATA:  Status post multiple falls last night now with left hip pain and left posterior medial knee pain  EXAM: LEFT KNEE - COMPLETE 4+ VIEW  COMPARISON:  None.  FINDINGS: The bones of the knee are adequately mineralized. There are large osteophytes associated with the tibial spines and the intercondylar notch. There is no femoral condyle nor tibial plateau fracture. The proximal fibula is intact. The medial and lateral joint compartments are  reasonably well maintained. There is a large osteophyte from the superior margin of the patella. No joint effusion is evident.  IMPRESSION: There is moderate  3 joint compartment osteoarthritic change of the left knee. There is no acute fracture nor dislocation.   Electronically Signed   By: David  [place]   On: 12/19/2014 08:57   Dg Hip Unilat With Pelvis Min 4 Views Left  12/19/2014   CLINICAL DATA:  Status post multiple falls last evening now with left hip pain.  EXAM: DG HIP W/ PWLVIS 4+V*L*  COMPARISON:  None.  FINDINGS: AP and lateral views of the left hip reveal the joint space to be preserved. There is no acute fracture nor dislocation. The femoral neck and intertrochanteric and subtrochanteric regions are normal. The observed portions of the bony pelvis are unremarkable.  IMPRESSION: There is no acute or significant chronic bony abnormality of the left hip.   Electronically Signed   By: David  [place]   On: 12/19/2014 08:58     EKG Interpretation   Date/Time:  Monday December 19 2014 08:21:12 EST Ventricular Rate:  83 PR Interval:  134 QRS Duration: 92 QT Interval:  380 QTC Calculation: 446 R Axis:   -26 Text Interpretation:  Normal sinus rhythm Cannot rule out Anterior infarct  , age undetermined No significant change since last tracing Confirmed by  [facility]  MD, Alphonzo LemmingsWHITNEY ([id number]) on 12/19/2014 8:47:44 AM      MDM   Final diagnoses:  Fall  Acute renal failure, unspecified acute renal failure type    Patient with 4 falls between 6 PM and 11 PM last night described as more of a neurologic cause for her fall. She feels a someone's pushing on her head causing her to fall backwards and she is unable to stop the fall. She did land on her knee and one in the falls and is complaining of severe knee and hip pain. She's been in a wheelchair since 11 PM initially to avoid falling but also because she's having severe pain in her knee and hip and is unable to ambulate. Patient has never had  similar symptoms prior to this. She denies nausea, vomiting, chest pain, shortness of breath. Low suspicion that these episodes are related to syncope based on patient's story and that she's had no loss of consciousness or felt lightheaded prior to the events. Concern for neurologic cause of her fall such as stroke versus intracranial hemorrhage, tumor versus anemia or electrolyte disturbance.  Stroke panel order set initiated. Also plain films of the left knee and hip pending  9:19 AM Workup negative for stroke the scar however patient found to have new acute renal failure with a creatinine at almost 4 from a baseline of 0.8. Patient does take preznide but denies any other medication changes and does not take NSAIDs.  She has been drinking normally and denies dehydration. However patient is unable to produce a urine sample here so she was given 1 L bolus.  Gwyneth SproutWhitney Eutimio Gharibian, MD 12/19/14 [id number]  Gwyneth SproutWhitney Bernadette Gores, MD 12/19/14 1005

## 2014-12-19 NOTE — Progress Notes (Signed)
Utilization review complete. Zenas Santa RN CCM Case Mgmt phone [phone] 

## 2014-12-19 NOTE — ED Notes (Signed)
Pt b/p 80's, notified Dr. Anitra LauthPlunkett of the same, also aware the patient has not urinated. Additional order for NS bolus. Pt denies further symptoms at this time

## 2014-12-19 NOTE — Progress Notes (Addendum)
12/19/2014 12:19 PM  Pt arrived to 6E13 via [facility] from [facility].  Pt is fully alert and oriented, up with two person assist but is very unsteady, especially due to pain of 10/10 in the left hip/knee/leg area.  Admission flow manager notified of patient's arrival.  Skin is intact,however, slight bruising noted to left hip and left knee area from fall.   Full assessment to [facility].  Pt states that she has fallen multiple times recently.  Per protocol, patient is a high fall risk.  I explained this to the patient, including necessary interventions, and reviewed the fall prevention safety plan with her, to which she verbalized understanding.  Safety plan left in room for patient.  Yellow arm band, yellow socks are in place and bed alarm turned on, bed in lowest position.  Vitals stable--noted that BP slightly elevated.  Pt was oriented to the room/unit, and was instructed on how to utilize the call bell, to which she verbalized understanding.  Awaiting further orders from MD at this time.  Will continue to monitor. Carita PianKIRKMAN, Jamy Whyte Brooke  Pt placed on tele box #[id number] and confirmed with central cardiac monitoring.   Theadora RamaKIRKMAN, Jaquila Santelli Brooke

## 2014-12-20 ENCOUNTER — Inpatient Hospital Stay (HOSPITAL_COMMUNITY): Payer: Medicare Other

## 2014-12-20 DIAGNOSIS — R55 Syncope and collapse: Secondary | ICD-10-CM

## 2014-12-20 DIAGNOSIS — N179 Acute kidney failure, unspecified: Secondary | ICD-10-CM | POA: Diagnosis not present

## 2014-12-20 LAB — GLUCOSE, CAPILLARY
Glucose-Capillary: 205 mg/dL — ABNORMAL HIGH (ref 70–99)
Glucose-Capillary: 190 mg/dL — ABNORMAL HIGH (ref 70–99)
Glucose-Capillary: 170 mg/dL — ABNORMAL HIGH (ref 70–99)

## 2014-12-20 LAB — COMPREHENSIVE METABOLIC PANEL
ALT: 28 U/L (ref 0–35)
AST: 20 U/L (ref 0–37)
Albumin: 3.4 g/dL — ABNORMAL LOW (ref 3.5–5.2)
Alkaline Phosphatase: 100 U/L (ref 39–117)
Anion gap: 7 (ref 5–15)
BUN: 28 mg/dL — ABNORMAL HIGH (ref 6–23)
CO2: 25 mmol/L (ref 19–32)
Calcium: 9 mg/dL (ref 8.4–10.5)
Chloride: 105 mEq/L (ref 96–112)
Creatinine, Ser: 1.18 mg/dL — ABNORMAL HIGH (ref 0.50–1.10)
GFR calc Af Amer: 54 mL/min — ABNORMAL LOW (ref >90)
GFR calc non Af Amer: 47 mL/min — ABNORMAL LOW (ref >90)
Glucose, Bld: 203 mg/dL — ABNORMAL HIGH (ref 70–99)
Potassium: 4.5 mmol/L (ref 3.5–5.1)
Sodium: 137 mmol/L (ref 135–145)
Total Bilirubin: 0.4 mg/dL (ref 0.3–1.2)
Total Protein: 6.8 g/dL (ref 6.0–8.3)

## 2014-12-20 LAB — TROPONIN I
Troponin I: <0.03 ng/mL (ref <0.031)
Troponin I: <0.03 ng/mL (ref <0.031)
Troponin I: <0.03 ng/mL (ref <0.031)

## 2014-12-20 LAB — CBC
HCT: 35.2 % — ABNORMAL LOW (ref 36.0–46.0)
Hemoglobin: 11.4 g/dL — ABNORMAL LOW (ref 12.0–15.0)
MCH: 25.2 pg — ABNORMAL LOW (ref 26.0–34.0)
MCHC: 32.4 g/dL (ref 30.0–36.0)
MCV: 77.7 fL — ABNORMAL LOW (ref 78.0–100.0)
Platelets: 229 10*3/uL (ref 150–400)
RBC: 4.53 MIL/uL (ref 3.87–5.11)
RDW: 16.8 % — ABNORMAL HIGH (ref 11.5–15.5)
WBC: 6.4 10*3/uL (ref 4.0–10.5)

## 2014-12-20 LAB — HEMOGLOBIN A1C
Hgb A1c MFr Bld: 7.7 % — ABNORMAL HIGH (ref <5.7)
Mean Plasma Glucose: 174 mg/dL — ABNORMAL HIGH (ref <117)

## 2014-12-20 MED ORDER — ACETAMINOPHEN 650 MG RE SUPP: 650.0000 mg | RECTAL | Status: DC | PRN

## 2014-12-20 MED ORDER — SODIUM CHLORIDE 0.9 % IV SOLN
INTRAVENOUS | Status: DC
  Administered 2014-12-20 – 2014-12-21 (×2): via INTRAVENOUS

## 2014-12-20 MED ORDER — ALUM & MAG HYDROXIDE-SIMETH 200-200-20 MG/5ML PO SUSP: 30.0000 mL | Freq: Four times a day (QID) | ORAL | Status: DC | PRN

## 2014-12-20 MED ORDER — ONDANSETRON HCL 4 MG/2ML IJ SOLN
4.0000 mg | Freq: Four times a day (QID) | INTRAMUSCULAR | Status: DC | PRN
  Administered 2014-12-20 – 2014-12-21 (×2): 4 mg via INTRAVENOUS
  Filled 2014-12-20 (×2): qty 2

## 2014-12-20 MED ORDER — ACETAMINOPHEN 325 MG PO TABS
650.0000 mg | ORAL_TABLET | Freq: Four times a day (QID) | ORAL | Status: DC | PRN
  Administered 2014-12-20: 650 mg via ORAL
  Filled 2014-12-20: qty 2

## 2014-12-20 NOTE — Progress Notes (Signed)
[facility] PROGRESS NOTE  Cynthia LexRuth Frances [name] JYN:[id number] DOB: Feb 17, 1946 DOA: 12/19/2014 PCP: [name], [name], MD  Assessment/Plan: 1-Acute Renal Failure;  Suspect multifactorial, hypotension, diuretic, ACE, dehydration in setting of diarrhea.  Improved with IV fluids.  Continue to hold HCTZ, Lisinopril.  Cr peak to 3. 9. It has decrease to 1.1.  Renal US negative for hydronephrosis.   2-Near syncope. Dizziness; headaches;  Orthostatic negative.  MRI negative for stroke, Cycle cardiac enzymes.  Continue with IV fluids.  PT, OT.   3-Nausea: could be related to pain medication, dilaudid.  If no improvement could consider diabetes gastroparesis. Will check Hb A1c.   4-Diarrhea; c diff pending.   5-Diabetes; continue with SSI. Check A 1c.     Code Status: full code.  Family Communication: care discussed with patient Disposition Plan: Remain inpatient . Will need PT evaluation when stable.    Consultants:  none  Procedures: Renal US; Normal sonographic appearance of both kidneys. No evidence of  hydronephrosis.  Antibiotics:  none  HPI/Subjective: Patient feeling very nauseous this morning. Complaining of headaches, dizziness.    Objective: Filed Vitals:   12/20/14 0420  BP: 111/80  Pulse: 88  Temp: 98 F (36.7 C)  Resp: 18    Intake/Output Summary (Last 24 hours) at 12/20/14 0827 Last data filed at 12/20/14 0402  Gross per 24 hour  Intake    480 ml  Output   3650 ml  Net  -3170 ml   Filed Weights   12/19/14 0750 12/19/14 2032  Weight: 96.163 kg (212 lb) 101.152 kg (223 lb)    Exam:   General:  Alert in no distress.   Cardiovascular: S 1, S 2 RRR  Respiratory: CTA  Abdomen: BS present, soft, NT  Musculoskeletal: trace edema.    Data Reviewed: Basic Metabolic Panel:  Recent Labs Lab 12/19/14 0810 12/19/14 1404 12/20/14 0547  NA 131*  --  137  K 4.4  --  4.5  CL 95*  --  105  CO2 24  --  25  GLUCOSE 200*  --   203*  BUN 56*  --  28*  CREATININE 3.92* 2.73* 1.18*  CALCIUM 8.7  --  9.0   Liver Function Tests:  Recent Labs Lab 12/19/14 0810 12/20/14 0547  AST 21 20  ALT 28 28  ALKPHOS 98 100  BILITOT 0.3 0.4  PROT 7.4 6.8  ALBUMIN 3.9 3.4*   No results for input(s): LIPASE, AMYLASE in the last 168 hours. No results for input(s): AMMONIA in the last 168 hours. CBC:  Recent Labs Lab 12/19/14 0810 12/19/14 1404 12/20/14 0547  WBC 11.7* 9.6 6.4  NEUTROABS 7.2  --   --   HGB 11.6* 11.1* 11.4*  HCT 36.6 34.7* 35.2*  MCV 79.0 79.0 77.7*  PLT 289 246 229   Cardiac Enzymes: No results for input(s): CKTOTAL, CKMB, CKMBINDEX, TROPONINI in the last 168 hours. BNP (last 3 results) No results for input(s): PROBNP in the last 8760 hours. CBG:  Recent Labs Lab 12/19/14 1157 12/19/14 1724 12/19/14 2034 12/20/14 0803  GLUCAP 154* 177* 189* 205*    No results found for this or any previous visit (from the past 240 hour(s)).   Studies: Dg Chest 2 View  12/19/2014   CLINICAL DATA:  Fall, dizziness.  Left hip pain.  EXAM: CHEST  2 VIEW  COMPARISON:  03/29/2014.  FINDINGS: Trachea is midline. Heart size normal. Lungs are clear. No pleural fluid. No pneumothorax.  IMPRESSION: No acute  findings.   Electronically Signed   By: Leanna Battles M.D.   On: 12/19/2014 08:55   Ct Head Wo Contrast  12/19/2014   CLINICAL DATA:  Dizziness beginning at 6:00 p.m. on 12/18/2014. Status post fall times four. Initial encounter.  EXAM: CT HEAD WITHOUT CONTRAST  TECHNIQUE: Contiguous axial images were obtained from the base of the skull through the vertex without intravenous contrast.  COMPARISON:  None.  FINDINGS: There is no evidence of acute intracranial abnormality including hemorrhage, infarct, mass lesion, mass effect, midline shift or abnormal extra-axial fluid collection. Mild atrophy and chronic microvascular ischemic change are identified. The calvarium is intact. Imaged paranasal sinuses and mastoid  air cells are clear.  IMPRESSION: No acute finding.   Electronically Signed   By: Drusilla Kanner M.D.   On: 12/19/2014 08:38   US Renal  12/19/2014   CLINICAL DATA:  Acute renal failure.  EXAM: RENAL/URINARY TRACT ULTRASOUND COMPLETE  COMPARISON:  None.  FINDINGS: Right Kidney:  Length: 10.4 cm. Echogenicity within normal limits. No mass or hydronephrosis visualized.  Left Kidney:  Length: 12.4 cm. Echogenicity within normal limits. No mass or hydronephrosis visualized.  Bladder:  Appears normal for degree of bladder distention.  IMPRESSION: Normal sonographic appearance of both kidneys. No evidence of hydronephrosis.   Electronically Signed   By: Myles Rosenthal M.D.   On: 12/19/2014 17:34   Dg Knee Complete 4 Views Left  12/19/2014   CLINICAL DATA:  Status post multiple falls last night now with left hip pain and left posterior medial knee pain  EXAM: LEFT KNEE - COMPLETE 4+ VIEW  COMPARISON:  None.  FINDINGS: The bones of the knee are adequately mineralized. There are large osteophytes associated with the tibial spines and the intercondylar notch. There is no femoral condyle nor tibial plateau fracture. The proximal fibula is intact. The medial and lateral joint compartments are reasonably well maintained. There is a large osteophyte from the superior margin of the patella. No joint effusion is evident.  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 There is no acute fracture nor dislocation.   Electronically Signed   By: David  [place]   On: 12/19/2014 08:57   Dg Hip Unilat With Pelvis Min 4 Views Left  12/19/2014   CLINICAL DATA:  Status post multiple falls last evening now with left hip pain.  EXAM: DG HIP W/ PWLVIS 4+V*L*  COMPARISON:  None.  FINDINGS: AP and lateral views of the left hip reveal the joint space to be preserved. There is no acute fracture nor dislocation. The femoral neck and intertrochanteric and subtrochanteric regions are normal. The  observed portions of the bony pelvis are unremarkable.  IMPRESSION: There is no acute or significant chronic bony abnormality of the left hip.   Electronically Signed   By: David  [place]   On: 12/19/2014 08:58    Scheduled Meds: . gabapentin  300 mg Oral TID  . heparin  5,000 Units Subcutaneous 3 times per day  . Influenza vac split quadrivalent PF  0.5 mL Intramuscular Tomorrow-1000  . insulin aspart  0-15 Units Subcutaneous TID WC  . pantoprazole  40 mg Oral Daily  . pneumococcal 23 valent vaccine  0.5 mL Intramuscular Tomorrow-1000   Continuous Infusions: . sodium chloride      Active Problems:   Acute renal failure   ARF (acute renal failure)   Syncope   Falls    Time spent: 35 minutes.  Hartley Barefoot A  [facility] Pager [phone]. If 7PM-7AM, please contact night-coverage at [email or web address], password [facility] 12/20/2014, 8:27 AM  LOS: 1 day

## 2014-12-21 DIAGNOSIS — W19XXXA Unspecified fall, initial encounter: Secondary | ICD-10-CM

## 2014-12-21 DIAGNOSIS — R42 Dizziness and giddiness: Secondary | ICD-10-CM

## 2014-12-21 DIAGNOSIS — E118 Type 2 diabetes mellitus with unspecified complications: Secondary | ICD-10-CM

## 2014-12-21 LAB — CBC
HCT: 36.3 % (ref 36.0–46.0)
Hemoglobin: 11.5 g/dL — ABNORMAL LOW (ref 12.0–15.0)
MCH: 24.8 pg — ABNORMAL LOW (ref 26.0–34.0)
MCHC: 31.7 g/dL (ref 30.0–36.0)
MCV: 78.4 fL (ref 78.0–100.0)
Platelets: 227 10*3/uL (ref 150–400)
RBC: 4.63 MIL/uL (ref 3.87–5.11)
RDW: 16.4 % — ABNORMAL HIGH (ref 11.5–15.5)
WBC: 8.2 10*3/uL (ref 4.0–10.5)

## 2014-12-21 LAB — GLUCOSE, CAPILLARY
Glucose-Capillary: 193 mg/dL — ABNORMAL HIGH (ref 70–99)
Glucose-Capillary: 193 mg/dL — ABNORMAL HIGH (ref 70–99)
Glucose-Capillary: 179 mg/dL — ABNORMAL HIGH (ref 70–99)
Glucose-Capillary: 200 mg/dL — ABNORMAL HIGH (ref 70–99)

## 2014-12-21 LAB — BASIC METABOLIC PANEL
Anion gap: 8 (ref 5–15)
BUN: 16 mg/dL (ref 6–23)
CO2: 25 mmol/L (ref 19–32)
Calcium: 8.6 mg/dL (ref 8.4–10.5)
Chloride: 99 mEq/L (ref 96–112)
Creatinine, Ser: 0.77 mg/dL (ref 0.50–1.10)
GFR calc Af Amer: >90 mL/min (ref >90)
GFR calc non Af Amer: 85 mL/min — ABNORMAL LOW (ref >90)
Glucose, Bld: 220 mg/dL — ABNORMAL HIGH (ref 70–99)
Potassium: 4.5 mmol/L (ref 3.5–5.1)
Sodium: 132 mmol/L — ABNORMAL LOW (ref 135–145)

## 2014-12-21 LAB — CLOSTRIDIUM DIFFICILE BY PCR: Toxigenic C. Difficile by PCR: NEGATIVE

## 2014-12-21 MED ORDER — WHITE PETROLATUM GEL
Status: AC
  Administered 2014-12-21: 1
  Filled 2014-12-21: qty 1

## 2014-12-21 MED ORDER — INSULIN GLARGINE 100 UNIT/ML ~~LOC~~ SOLN
5.0000 [IU] | Freq: Every day | SUBCUTANEOUS | Status: DC
  Administered 2014-12-21 – 2014-12-22 (×2): 5 [IU] via SUBCUTANEOUS
  Filled 2014-12-21 (×2): qty 0.05

## 2014-12-21 NOTE — Evaluation (Signed)
Physical Therapy Evaluation Patient Details Name: [name] MRN: [id number] DOB: 1946/04/30 Today's Date: 12/21/2014   History of Present Illness  Pt is a 69 yo female admitted for ARF after falling at work.  Pt had L knee and hip xrayed with results negative.  When in ER pt found to have ARF and dehydration.    Clinical Impression  Pt admitted with above diagnosis. Pt currently with functional limitations due to the deficits listed below (see PT Problem List). At the time of PT eval pt was able to perform transfers and ambulation with supervision to mod I. Pt does not feel she needs follow-up at home, however feel that outpatient PT could be a good option to improve balance and decrease frequency of falls at home. Pt will benefit from skilled PT to increase their independence and safety with mobility to allow discharge to the venue listed below.       Follow Up Recommendations Outpatient PT    Equipment Recommendations  None recommended by PT    Recommendations for Other Services       Precautions / Restrictions Precautions Precautions: Fall Restrictions Weight Bearing Restrictions: No      Mobility  Bed Mobility Overal bed mobility: Modified Independent             General bed mobility comments: extra time given  Transfers Overall transfer level: Modified independent Equipment used: Rolling walker (2 wheeled)             General transfer comment: No physical assist. Pt able to power-up to full standing with RW for support.   Ambulation/Gait Ambulation/Gait assistance: Supervision Ambulation Distance (Feet): 200 Feet Assistive device: Rolling walker (2 wheeled) Gait Pattern/deviations: Step-through pattern;Decreased stride length;Decreased weight shift to left;Decreased stance time - left Gait velocity: Decreased Gait velocity interpretation: Below normal speed for age/gender General Gait Details: Pt with limp due to L knee and ankle pain. With RW,  pt is safe and does not require any assistance.   Stairs            Wheelchair Mobility    Modified Rankin (Stroke Patients Only)       Balance Overall balance assessment: No apparent balance deficits (not formally assessed)                                           Pertinent Vitals/Pain Pain Assessment: Faces Pain Score: 5  Faces Pain Scale: Hurts even more Pain Location: L ankle Pain Descriptors / Indicators: Aching Pain Intervention(s): Limited activity within patient's tolerance;Monitored during session;Other (comment) (ACE bandaged ankle for support)    Home Living Family/patient expects to be discharged to:: Private residence Living Arrangements: Alone Available Help at Discharge: Other (Comment) (takes care of significant other who has cancer) Type of Home: House Home Access: Ramped entrance     Home Layout: One level Home Equipment: Walker - 2 wheels;Cane - single point;Bedside commode;Shower seat Additional Comments: 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  Therefore, she does NOT like being in the hospital and being cared for.  She is very independent and modest.  She greatly dislikes the bed alarms etc but I do feel she is cognitively intact and able to make good decisions.  Therapist listened to her frustrations and provided endouragement.  Prior Function Level of Independence: Independent with assistive device(s)               Hand Dominance   Dominant Hand: Right    Extremity/Trunk Assessment   Upper Extremity Assessment: Overall WFL for tasks assessed           Lower Extremity Assessment: LLE deficits/detail   LLE Deficits / Details: Left hip, knee, ankle pain from fall. Imaging negative for acute injury, however edema noted. Per RN, PT ACE wrapped L ankle for support and pain relief.   Cervical / Trunk Assessment: Normal  Communication    Communication: No difficulties  Cognition Arousal/Alertness: Awake/alert Behavior During Therapy: WFL for tasks assessed/performed Overall Cognitive Status: Within Functional Limits for tasks assessed                      General Comments General comments (skin integrity, edema, etc.): Pt, when medically stable, probably ok to dc home with friends checking in on her.    Exercises        Assessment/Plan    PT Assessment Patient needs continued PT services  PT Diagnosis Difficulty walking;Acute pain   PT Problem List Decreased strength;Decreased range of motion;Decreased activity tolerance;Decreased balance;Decreased mobility;Decreased knowledge of use of DME;Decreased safety awareness;Decreased knowledge of precautions;Pain  PT Treatment Interventions DME instruction;Gait training;Stair training;Functional mobility training;Therapeutic activities;Therapeutic exercise;Neuromuscular re-education;Patient/family education   PT Goals (Current goals can be found in the Care Plan section) Acute Rehab PT Goals Patient Stated Goal: to go home now. PT Goal Formulation: With patient Time For Goal Achievement: 12/28/14 Potential to Achieve Goals: Good    Frequency Min 3X/week   Barriers to discharge        Co-evaluation               End of Session Equipment Utilized During Treatment: Gait belt Activity Tolerance: Patient tolerated treatment well Patient left: in bed;with call bell/phone within reach Nurse Communication: Mobility status;Other (comment) (IV bleeding - RN notified. )         Time: 1914-7829: 1323-1410 PT Time Calculation (min) (ACUTE ONLY): 47 min   Charges:   PT Evaluation $Initial PT Evaluation Tier I: 1 Procedure PT Treatments $Gait Training: 23-37 mins $Therapeutic Activity: 8-22 mins   PT G Codes:        Conni SlipperKirkman, Tiya Schrupp 12/21/2014, 2:22 PM   Conni SlipperLaura Reegan Bouffard, PT, DPT Acute Rehabilitation Services Pager: [phone]

## 2014-12-21 NOTE — Progress Notes (Signed)
VASCULAR LAB PRELIMINARY  PRELIMINARY  PRELIMINARY  PRELIMINARY  Carotid doppler completed bedside.    Preliminary report:  Ratios and velocities suggest 0-39% carotid stenosis.  Redmond Pullingltzroth, Jerauld Bostwick F, RVT 12/21/2014, 3:39 PM

## 2014-12-21 NOTE — Progress Notes (Addendum)
Inpatient Diabetes Program Recommendations  AACE/ADA: New Consensus Statement on Inpatient Glycemic Control (2013)  Target Ranges:  Prepandial:   less than 140 mg/dL      Peak postprandial:   less than 180 mg/dL (1-2 hours)      Critically ill patients:  140 - 180 mg/dL   Results for Marykay LexETERSON, Hermenia FRANCES (MRN [id number]) as of 12/21/2014 11:23  Ref. Range 12/20/2014 08:03 12/20/2014 12:03 12/20/2014 16:39 12/21/2014 08:01  Glucose-Capillary Latest Range: 70-99 mg/dL 147205 (H) 829190 (H) 562170 (H) 193 (H)   Diabetes history: DM2 Outpatient Diabetes medications: Lantus 70 units QHS, Novolog 15 units with breakfast and lunch, Novolog 20 units with supper (per med rec: pt is taking Novolog different than prescribed; pt is Injecting 10 units with each meal, unless meal includes starch, then injecting 12 units), Metformin 500 mg with breakfast and Metformin 1000 mg with supper Current orders for Inpatient glycemic control: Novolog 0-15 units TID with meals  Inpatient Diabetes Program Recommendations Insulin - Basal: Please consider ordering low dose basal insulin; recommend starting with Lantus 5 units QHS.  Thanks, Orlando PennerMarie Wille Aubuchon, RN, MSN, CCRN, CDE Diabetes Coordinator Inpatient Diabetes Program [phone] (Team Pager) [phone] ([facility] office) [phone] [facility] office)

## 2014-12-21 NOTE — Evaluation (Signed)
Occupational Therapy Evaluation and Discharge Summary Patient Details Name: [name] MRN: [id number] DOB: [birth year] Today's Date: 12/21/2014    History of Present Illness Pt is a 69 yo female admitted for ARF after falling at work.  Pt had L knee and hip xrayed with results negative.  When in ER pt found to have ARF and dehydration.     Clinical Impression   Pt admitted for the above diagnosis and has pain in her L knee and hip but is able to complete all basic adls without assist although slowly.  PT to see pt for ambulation due to knee pain. Pt was able to ambulate in room with mod I using IV pole.  No acute OT needs at this time. Pt is not interested nor does she feel like she needs services at home despite having to be independent.  OT to sign off.    Follow Up Recommendations  Supervision - Intermittent;No OT follow up    Equipment Recommendations  None recommended by OT    Recommendations for Other Services       Precautions / Restrictions Precautions Precautions: Fall Restrictions Weight Bearing Restrictions: No      Mobility Bed Mobility Overal bed mobility: Modified Independent             General bed mobility comments: extra time given  Transfers Overall transfer level: Modified independent Equipment used:  (used IV pole.  Has cane at home.)             General transfer comment: PT was safe with transfers.  Pt continues to have a limp due to L knee pain but she was safe with ambulation although slow.    Balance Overall balance assessment: No apparent balance deficits (not formally assessed)                                          ADL Overall ADL's : Modified independent                                       General ADL Comments: Pt is able to do all of her basic adls.  Pt now walks with a limp due to knee pain and may need a cane at all times.  For eval, she pushed the IV pole with no LOB.  Will  defer this deficit to PT to evaluate but feel she does not need acute OT at this time.  Again, pt is modest and likes to be independent and found ways to complete all basic adls safely.  She is not interested in home OT eval for safety reasons and feel she will be fine at home.     Vision                     Perception     Praxis      Pertinent Vitals/Pain Pain Assessment: 0-10 Pain Score: 5  Pain Location: l knee Pain Descriptors / Indicators: Aching Pain Intervention(s): Monitored during session;Repositioned     Hand Dominance Right   Extremity/Trunk Assessment Upper Extremity Assessment Upper Extremity Assessment: Overall WFL for tasks assessed   Lower Extremity Assessment Lower Extremity Assessment: Defer to PT evaluation   Cervical / Trunk Assessment Cervical / Trunk Assessment: Normal   Communication Communication Communication:  No difficulties   Cognition Arousal/Alertness: Awake/alert Behavior During Therapy: WFL for tasks assessed/performed Overall Cognitive Status: Within Functional Limits for tasks assessed                     General Comments       Exercises       Shoulder Instructions      Home Living Family/patient expects to be discharged to:: Private residence Living Arrangements: Alone Available Help at Discharge: Other (Comment) (takes care of significant other who has cancer) Type of Home: House Home Access: Ramped entrance     Home Layout: One level     Bathroom Shower/Tub: Walk-in shower;Door   [employer] Toilet: Standard Bathroom Accessibility: Yes How Accessible: Accessible via walker Home Equipment: Walker - 2 wheels;Cane - single point;Bedside commode;Shower seat   Additional Comments: 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  Therefore, she does NOT like being in the hospital and being cared for.  She is very independent and modest.   She greatly dislikes the bed alarms etc but I do feel she is cognitively intact and able to make good decisions.  Therapist listened to her frustrations and provided endouragement.      Prior Functioning/Environment Level of Independence: Independent with assistive device(s)             OT Diagnosis:     OT Problem List:     OT Treatment/Interventions:      OT Goals(Current goals can be found in the care plan section) Acute Rehab OT Goals Patient Stated Goal: to go home now. OT Goal Formulation: All assessment and education complete, DC therapy  OT Frequency:     Barriers to D/C:            Co-evaluation              End of Session Nurse Communication: Mobility status;Other (comment) (pt likes lights off in the room and door closed.)  Activity Tolerance: Patient tolerated treatment well Patient left: in bed;with call bell/phone within reach   Time: 1155-1224 OT Time Calculation (min): 29 min Charges:  OT General Charges $OT Visit: 1 Procedure OT Evaluation $Initial OT Evaluation Tier I: 1 Procedure OT Treatments $Self Care/Home Management : 23-37 mins G-Codes:    Hope Budds 2015-01-18, 12:34 PM  [phone]

## 2014-12-21 NOTE — Progress Notes (Signed)
[facility]                                                                              Patient Demographics  [name], is [name] 69 y.o. female, DOB - 12-17-1945, ONG:[id number]  Admit date - 12/19/2014   Admitting Physician Alba CoryBelkys [name] Regalado, MD  Outpatient Primary MD for the patient is [name], Cynthia A, MD  LOS - 2   Chief Complaint  Patient presents with  . Dizziness      HPI on 12/19/2014 by Dr. Albertine GratesFang Xu Patient will be discharged home.  She will need to followup with her primary care physician within one week of discharge.  She will also need to followup with Dr. Daiva EvesVan [name] within 2 weeks of discharge. Patient should continue her medications as prescribed.  PT home health was offered, however, patient does not feel she needs PT.  She is to continue [name] heart healthy diet/renal diet. Patient will need to have her Potassium monitored and replaced accordingly.  Assessment & Plan   Acute Renal Failure -Likely multifactorial, hypotension, diuretic, ACE, dehydration in setting of diarrhea.  -improved with IVF (will discontinue today) -Continue to hold HCTZ, Lisinopril.  -Cr peak to 3. 9. It has decrease to 0.77 -Renal US negative for hydronephrosis.   Near syncope. Dizziness; headaches;  -Orthostatic vitals negative.  -CT and MRI negative for stroke -Cycled cardiac enzymes, troponins remain negative -PT and OT consulted  Nausea -Resolved, could have been related to pain medication, dilaudid.   Diarrhea -Patient admits to having [name] BM every 3 days and yesterday just so happened to be the day she had [name] large BM -She denies further diarrhea -Cdiff negative  Diabetes Mellitus, type 2 -continue with SSI, Lantus added, and CBG monitoring -HbA1c 7.7 -metformin held due to AKI, but may restart at discharge  Code Status: Full  Family Communication: None at bedside  Disposition Plan: Admitted, likely discharge within 24 hours  Time Spent in minutes   30  minutes  Procedures  Renal US: normal, no evidence of hydronephrosis  Consults   None  DVT Prophylaxis  heparin  Lab Results  Component Value Date   PLT 227 12/21/2014    Medications  Scheduled Meds: . gabapentin  300 mg Oral TID  . heparin  5,000 Units Subcutaneous 3 times per day  . insulin aspart  0-15 Units Subcutaneous TID WC  . pantoprazole  40 mg Oral Daily   Continuous Infusions:  PRN Meds:.acetaminophen, acetaminophen, alum & mag hydroxide-simeth, ondansetron (ZOFRAN) IV  Antibiotics    Anti-infectives    None        Subjective:   Cynthia DodrillRuth [name] seen and examined today.  Patient is feeling better today.  She still complains of some dizziness and weakness.  She denies chest pain, SOB, abdominal pain.    Objective:   Filed Vitals:   12/20/14 2100 12/21/14 0546 12/21/14 0834 12/21/14 1000  BP: 110/66 129/57 140/70 135/70  Pulse: 80 60 93 85  Temp: 97.9 F (36.6 C) 98.7 F (37.1 C) 98.2 F (36.8 C) 98.5 F (36.9 C)  TempSrc: Oral Oral Oral Oral  Resp: 20 18 18 18   Height:  Weight: 100.6 kg (221 lb 12.5 oz)     SpO2:  94% 93% 93%    Wt Readings from Last 3 Encounters:  12/20/14 100.6 kg (221 lb 12.5 oz)  07/06/14 97.07 kg (214 lb)  06/03/14 100.245 kg (221 lb)     Intake/Output Summary (Last 24 hours) at 12/21/14 1304 Last data filed at 12/21/14 1243  Gross per 24 hour  Intake 1831.66 ml  Output      0 ml  Net 1831.66 ml    Exam  General: Well developed, well nourished, NAD, appears stated age  HEENT: NCAT, mucous membranes moist.   Cardiovascular: S1 S2 auscultated, no rubs, murmurs or gallops. Regular rate and rhythm.  Respiratory: Clear to auscultation bilaterally with equal chest rise  Abdomen: Soft, nontender, nondistended, + bowel sounds  Extremities: warm dry without cyanosis clubbing. Trace edema LE B/L  Neuro: AAOx3, nonfocal  Psych: Normal affect and demeanor with intact judgement and insight  Data Review    Micro Results Recent Results (from the past 240 hour(s))  Culture, blood (routine x 2)     Status: None (Preliminary result)   Collection Time: 12/19/14  6:27 PM  Result Value Ref Range Status   Specimen Description BLOOD LEFT ARM  Final   Special Requests   Final    BOTTLES DRAWN AEROBIC AND ANAEROBIC 10CC AER,5CC ANA   Culture   Final           BLOOD CULTURE RECEIVED NO GROWTH TO DATE CULTURE WILL BE HELD FOR 5 DAYS BEFORE ISSUING [name] FINAL NEGATIVE REPORT Note: Culture results may be compromised due to an excessive volume of blood received in culture bottles. Performed at [facility]    Report Status PENDING  Incomplete  Culture, blood (routine x 2)     Status: None (Preliminary result)   Collection Time: 12/19/14  6:35 PM  Result Value Ref Range Status   Specimen Description BLOOD RIGHT HAND  Final   Special Requests BOTTLES DRAWN AEROBIC ONLY 8CC  Final   Culture   Final           BLOOD CULTURE RECEIVED NO GROWTH TO DATE CULTURE WILL BE HELD FOR 5 DAYS BEFORE ISSUING [name] FINAL NEGATIVE REPORT Performed at [facility]    Report Status PENDING  Incomplete  Clostridium Difficile by PCR     Status: None   Collection Time: 12/20/14  9:51 PM  Result Value Ref Range Status   C difficile by pcr NEGATIVE NEGATIVE Final    Radiology Reports Dg Chest 2 View  12/19/2014   CLINICAL DATA:  Fall, dizziness.  Left hip pain.  EXAM: CHEST  2 VIEW  COMPARISON:  03/29/2014.  FINDINGS: Trachea is midline. Heart size normal. Lungs are clear. No pleural fluid. No pneumothorax.  IMPRESSION: No acute findings.   Electronically Signed   By: Leanna Battles M.D.   On: 12/19/2014 08:55   Ct Head Wo Contrast  12/19/2014   CLINICAL DATA:  Dizziness beginning at 6:00 p.m. on 12/18/2014. Status post fall times four. Initial encounter.  EXAM: CT HEAD WITHOUT CONTRAST  TECHNIQUE: Contiguous axial images were obtained from the base of the skull through the vertex without intravenous contrast.   COMPARISON:  None.  FINDINGS: There is no evidence of acute intracranial abnormality including hemorrhage, infarct, mass lesion, mass effect, midline shift or abnormal extra-axial fluid collection. Mild atrophy and chronic microvascular ischemic change are identified. The calvarium is intact. Imaged paranasal sinuses and mastoid air  cells are clear.  IMPRESSION: No acute finding.   Electronically Signed   By: Drusilla Kanner M.D.   On: 12/19/2014 08:38   Mr Brain Wo Contrast  12/20/2014   CLINICAL DATA:  69 year old diabetic hypertensive female with hyperlipidemia presenting with 1 week of mild headache and occasional dizziness. Subsequent encounter.  EXAM: MRI HEAD WITHOUT CONTRAST  TECHNIQUE: Multiplanar, multiecho pulse sequences of the brain and surrounding structures were obtained without intravenous contrast.  COMPARISON:  12/19/2014 CT.  No comparison MR.  FINDINGS: No acute infarct.  No intracranial hemorrhage.  No hydrocephalus.  No intracranial mass lesion noted on this unenhanced exam.  Major intracranial vascular structures are patent.  Exophthalmos.  Post lens replacement.  Cervical spondylotic changes most notable right C2-3 facet joint.  Cervical medullary junction, pituitary region and pineal region unremarkable.  IMPRESSION: No acute infarct.  Please see above.   Electronically Signed   By: Bridgett Larsson M.D.   On: 12/20/2014 11:02   US Renal  12/19/2014   CLINICAL DATA:  Acute renal failure.  EXAM: RENAL/URINARY TRACT ULTRASOUND COMPLETE  COMPARISON:  None.  FINDINGS: Right Kidney:  Length: 10.4 cm. Echogenicity within normal limits. No mass or hydronephrosis visualized.  Left Kidney:  Length: 12.4 cm. Echogenicity within normal limits. No mass or hydronephrosis visualized.  Bladder:  Appears normal for degree of bladder distention.  IMPRESSION: Normal sonographic appearance of both kidneys. No evidence of hydronephrosis.   Electronically Signed   By: Myles Rosenthal M.D.   On: 12/19/2014 17:34     Dg Knee Complete 4 Views Left  12/19/2014   CLINICAL DATA:  Status post multiple falls last night now with left hip pain and left posterior medial knee pain  EXAM: LEFT KNEE - COMPLETE 4+ VIEW  COMPARISON:  None.  FINDINGS: The bones of the knee are adequately mineralized. There are large osteophytes associated with the tibial spines and the intercondylar notch. There is no femoral condyle nor tibial plateau fracture. The proximal fibula is intact. The medial and lateral joint compartments are reasonably well maintained. There is [name] large osteophyte from the superior margin of the patella. No joint effusion is evident.  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 There is no acute fracture nor dislocation.   Electronically Signed   By: David  [place]   On: 12/19/2014 08:57   Dg Hip Unilat With Pelvis Min 4 Views Left  12/19/2014   CLINICAL DATA:  Status post multiple falls last evening now with left hip pain.  EXAM: DG HIP W/ PWLVIS 4+V*L*  COMPARISON:  None.  FINDINGS: AP and lateral views of the left hip reveal the joint space to be preserved. There is no acute fracture nor dislocation. The femoral neck and intertrochanteric and subtrochanteric regions are normal. The observed portions of the bony pelvis are unremarkable.  IMPRESSION: There is no acute or significant chronic bony abnormality of the left hip.   Electronically Signed   By: David  [place]   On: 12/19/2014 08:58    CBC  Recent Labs Lab 12/19/14 0810 12/19/14 1404 12/20/14 0547 12/21/14 0600  WBC 11.7* 9.6 6.4 8.2  HGB 11.6* 11.1* 11.4* 11.5*  HCT 36.6 34.7* 35.2* 36.3  PLT 289 246 229 227  MCV 79.0 79.0 77.7* 78.4  MCH 25.1* 25.3* 25.2* 24.8*  MCHC 31.7 32.0 32.4 31.7  RDW 17.5* 17.0* 16.8* 16.4*  LYMPHSABS 3.1  --   --   --   MONOABS 1.1*  --   --   --  EOSABS 0.3  --   --   --   BASOSABS 0.1  --   --   --     Chemistries   Recent Labs Lab 12/19/14 0810 12/19/14 1404  12/20/14 0547 12/21/14 0600  NA 131*  --  137 132*  K 4.4  --  4.5 4.5  CL 95*  --  105 99  CO2 24  --  25 25  GLUCOSE 200*  --  203* 220*  BUN 56*  --  28* 16  CREATININE 3.92* 2.73* 1.18* 0.77  CALCIUM 8.7  --  9.0 8.6  AST 21  --  20  --   ALT 28  --  28  --   ALKPHOS 98  --  100  --   BILITOT 0.3  --  0.4  --    ------------------------------------------------------------------------------------------------------------------ estimated creatinine clearance is 78.7 mL/min (by C-G formula based on Cr of 0.77). ------------------------------------------------------------------------------------------------------------------  Recent Labs  12/20/14 0900  HGBA1C 7.7*   ------------------------------------------------------------------------------------------------------------------ No results for input(s): CHOL, HDL, LDLCALC, TRIG, CHOLHDL, LDLDIRECT in the last 72 hours. ------------------------------------------------------------------------------------------------------------------  Recent Labs  12/19/14 1827  TSH 1.306   ------------------------------------------------------------------------------------------------------------------ No results for input(s): VITAMINB12, FOLATE, FERRITIN, TIBC, IRON, RETICCTPCT in the last 72 hours.  Coagulation profile  Recent Labs Lab 12/19/14 0810  INR 1.00    No results for input(s): DDIMER in the last 72 hours.  Cardiac Enzymes  Recent Labs Lab 12/20/14 1201 12/20/14 1445 12/20/14 2119  TROPONINI <0.03 <0.03 <0.03   ------------------------------------------------------------------------------------------------------------------ Invalid input(s): POCBNP    Adanely Reynoso D.O. on 12/21/2014 at 1:04 PM  Between 7am to 7pm - Pager - [phone]  After 7pm go to [email or web address] - password [email or web address]  And look for the night coverage person covering for me after hours  [facility] Office  [phone]

## 2014-12-22 LAB — GLUCOSE, CAPILLARY
Glucose-Capillary: 192 mg/dL — ABNORMAL HIGH (ref 70–99)
Glucose-Capillary: 206 mg/dL — ABNORMAL HIGH (ref 70–99)

## 2014-12-22 MED ORDER — LISINOPRIL 20 MG PO TABS: 20.0000 mg | ORAL_TABLET | Freq: Every day | ORAL | Status: DC

## 2014-12-22 MED ORDER — FUROSEMIDE 20 MG PO TABS: 20.0000 mg | ORAL_TABLET | Freq: Every day | ORAL | Status: DC | PRN

## 2014-12-22 MED ORDER — INSULIN NPH (HUMAN) (ISOPHANE) 100 UNIT/ML ~~LOC~~ SUSP: SUBCUTANEOUS | Status: DC

## 2014-12-22 NOTE — Progress Notes (Signed)
Inpatient Diabetes Program Recommendations  AACE/ADA: New Consensus Statement on Inpatient Glycemic Control (2013)  Target Ranges:  Prepandial:   less than 140 mg/dL      Peak postprandial:   less than 180 mg/dL (1-2 hours)      Critically ill patients:  140 - 180 mg/dL   Results for [name], [name] (MRN [id number]) as of 12/22/2014 09:16  Ref. Range 12/21/2014 08:01 12/21/2014 12:27 12/21/2014 16:10 12/21/2014 22:12 12/22/2014 07:31  Glucose-Capillary Latest Range: 70-99 mg/dL 829193 (H) 562193 (H) 130179 (H) 200 (H) 192 (H)   Diabetes history: DM2 Outpatient Diabetes medications: Lantus 70 units QHS, Novolog 15 units with breakfast and lunch, Novolog 20 units with supper (per med rec: pt is taking Novolog different than prescribed; pt is Injecting 10 units with each meal, unless meal includes starch, then injecting 12 units), Metformin 500 mg with breakfast and Metformin 1000 mg with supper Current orders for Inpatient glycemic control: Lantus 5 units QHS, Novolog 0-15 units TID with meals   Inpatient Diabetes Program Recommendations Insulin - Basal: Please consider increasing Lantus to 8 units daily. Correction (SSI): Please consider ordering Novolog bedtime correction scale.  Thanks, Orlando PennerMarie Laurren Lepkowski, RN, MSN, CCRN, CDE Diabetes Coordinator Inpatient Diabetes Program [phone] (Team Pager) [phone] ([facility] office) [phone] [facility] office)

## 2014-12-22 NOTE — Progress Notes (Signed)
Discharge instructions and medications discussed with patient.  All questions answered.  

## 2014-12-22 NOTE — Discharge Instructions (Signed)
Acute Kidney Injury Acute kidney injury is a disease in which there is sudden (acute) damage to the kidneys. The kidneys are 2 organs that lie on either side of the spine between the middle of the back and the front of the abdomen. The kidneys:  Remove wastes and extra water from the blood.   Produce important hormones. These help keep bones strong, regulate blood pressure, and help create red blood cells.   Balance the fluids and chemicals in the blood and tissues. A small amount of kidney damage may not cause problems, but a large amount of damage may make it difficult or impossible for the kidneys to work the way they should. Acute kidney injury may develop into long-lasting (chronic) kidney disease. It may also develop into a life-threatening disease called end-stage kidney disease. Acute kidney injury can get worse very quickly, so it should be treated right away. Early treatment may prevent other kidney diseases from developing.  CAUSES   A problem with blood flow to the kidneys. This may be caused by:   Blood loss.   Heart disease.   Severe burns.   Liver disease.  Direct damage to the kidneys. This may be caused by:  Some medicines.   A kidney infection.   Poisoning or consuming toxic substances.   A surgical wound.   A blow to the kidney area.   A problem with urine flow. This may be caused by:   Cancer.   Kidney stones.   An enlarged prostate. SYMPTOMS   Swelling (edema) of the legs, ankles, or feet.   Tiredness (lethargy).   Nausea or vomiting.   Confusion.   Problems with urination, such as:   Painful or burning feeling during urination.   Decreased urine production.   Frequent accidents in children who are potty trained.   Bloody urine.   Muscle twitches and cramps.   Shortness of breath.   Seizures.   Chest pain or pressure. Sometimes, no symptoms are present. DIAGNOSIS Acute kidney injury may be detected  and diagnosed by tests, including blood, urine, imaging, or kidney biopsy tests.  TREATMENT Treatment of acute kidney injury varies depending on the cause and severity of the kidney damage. In mild cases, no treatment may be needed. The kidneys may heal on their own. If acute kidney injury is more severe, your caregiver will treat the cause of the kidney damage, help the kidneys heal, and prevent complications from occurring. Severe cases may require a procedure to remove toxic wastes from the body (dialysis) or surgery to repair kidney damage. Surgery may involve:   Repair of a torn kidney.   Removal of an obstruction. Most of the time, you will need to stay overnight at the hospital.  HOME CARE INSTRUCTIONS:  Follow your prescribed diet.  Only take over-the-counter or prescription medicines as directed by your caregiver.  Do not take any new medicines (prescription, over-the-counter, or nutritional supplements) unless approved by your caregiver. Many medicines can worsen your kidney damage or need to have the dose adjusted.   Keep all follow-up appointments as directed by your caregiver.  Observe your condition to make sure you are healing as expected. SEEK IMMEDIATE MEDICAL CARE IF:  You are feeling ill or have severe pain in the back or side.   Your symptoms return or you have new symptoms.  You have any symptoms of end-stage kidney disease. These include:   Persistent itchiness.   Loss of appetite.   Headaches.   Abnormally dark   or light skin.  Numbness in the hands or feet.   Easy bruising.   Frequent hiccups.   Menstruation stops.   You have a fever.  You have increased urine production.  You have pain or bleeding when urinating. MAKE SURE YOU:   Understand these instructions.  Will watch your condition.  Will get help right away if you are not doing well or get worse Document Released: 06/10/2011 Document Revised: 03/22/2013 Document  Reviewed: 07/24/2012 Exit[employer] Patient Information 2015 [employer]. This information is not intended to replace advice given to you by your health care provider. Make sure you discuss any questions you have with your health care provider.  

## 2014-12-22 NOTE — Discharge Summary (Signed)
Physician Discharge Summary  [name] AVW:[id number] DOB: 21-Mar-1946 DOA: 12/19/2014  PCP: [name], MD  Admit date: 12/19/2014 Discharge date: 12/22/2014  Time spent: 45 minutes  Recommendations for Outpatient Follow-up:  Patient will be discharged to home with outpatient physical therapy.  She will need to followup with her primary care physician upon discharge within 1-2 weeks. Patient will also need to continue her medications as prescribed. Patient should follow a carb modified diet.   Discharge Diagnoses:  Acute renal failure Syncope, dizziness, headache Nausea Diarrhea Diabetes mellitus type 2  Discharge Condition: Stable   Diet recommendation: Carb modified  Filed Weights   12/19/14 0750 12/19/14 2032 12/20/14 2100  Weight: 96.163 kg (212 lb) 101.152 kg (223 lb) 100.6 kg (221 lb 12.5 oz)    History of present illness:  on 12/19/2014 by [name] Patient will be discharged home. She will need to followup with her primary care physician within one week of discharge. She will also need to followup with Dr. [name] within 2 weeks of discharge. Patient should continue her medications as prescribed. PT home health was offered, however, patient does not feel she needs PT. She is to continue a heart healthy diet/renal diet. Patient will need to have her Potassium monitored and replaced accordingly.  Hospital Course:  Acute Renal Failure -Likely multifactorial, hypotension, diuretic, ACE, dehydration in setting of diarrhea.  -improved with IVF  -HCTZ, Lisinopril held during hospital course -Cr peaked to 3. 9. It has decrease to 0.77 -Renal US negative for hydronephrosis. -Patient does not wish to be placed back on HCTZ.  Patient will be discharged with lasix and lisinopril.   Near syncope, Dizziness, headaches -Orthostatic vitals negative. -Patient: Complains of dizziness or headaches.  -CT and MRI negative for stroke -Cycled cardiac enzymes,  troponins remain negative -PT recommended outpatient PT -Carotid doppler: Ratios and velocities suggest 0-39% carotid stenosis  Nausea -Resolved, could have been related to pain medication, dilaudid.   Diarrhea -Patient admits to having a BM every 3 days and yesterday just so happened to be the day she had a large BM -She denies further diarrhea -Cdiff negative  Diabetes Mellitus, type 2 -continue with SSI, Lantus added, and CBG monitoring -HbA1c 7.7 -metformin held due to AKI, but may restart at discharge  Procedures: Carotid doppler Renal US: normal, no evidence of hydronephrosis  Consultations: None  Discharge Exam: Filed Vitals:   12/22/14 0909  BP: 122/59  Pulse: 88  Temp: 98.8 F (37.1 C)  Resp: 16     General: Well developed, well nourished, NAD, appears stated age  HEENT: NCAT, mucous membranes moist.  Cardiovascular: S1 S2 auscultated, no rubs, murmurs or gallops. Regular rate and rhythm.  Respiratory: Clear to auscultation bilaterally with equal chest rise  Abdomen: Soft, nontender, nondistended, + bowel sounds  Extremities: warm dry without cyanosis clubbing.   Neuro: AAOx3, nonfocal  Psych: Normal affect and demeanor  Discharge Instructions      Discharge Instructions    Discharge instructions    Complete by:  As directed   Patient will be discharged to home with outpatient physical therapy.  She will need to followup with her primary care physician upon discharge within 1-2 weeks. Patient will also need to continue her medications as prescribed. Patient should follow a carb modified diet.            Medication List    STOP taking these medications        lisinopril-hydrochlorothiazide 20-25 MG per  tablet  Commonly known as:  PRINZIDE,ZESTORETIC      TAKE these medications        ASPERCREME EX  Apply 1 application topically daily as needed (shoulder/hips/knee/knuckles).     dicyclomine 20 MG tablet  Commonly known as:  BENTYL    Take 1 tablet (20 mg total) by mouth 3 (three) times daily before meals.     furosemide 20 MG tablet  Commonly known as:  LASIX  Take 1 tablet (20 mg total) by mouth daily as needed for fluid or edema.     gabapentin 300 MG capsule  Commonly known as:  NEURONTIN  Take 1 capsule (300 mg total) by mouth 3 (three) times daily.     ibuprofen 200 MG tablet  Commonly known as:  ADVIL,MOTRIN  Take 800 mg by mouth every 6 (six) hours as needed (pain).     insulin aspart 100 UNIT/ML injection  Commonly known as:  NOVOLOG  Take 15 units with breakfast and lunch, 20 units with night time meal.     insulin glargine 100 UNIT/ML injection  Commonly known as:  LANTUS  Inject 70 Units into the skin at bedtime.     insulin NPH Human 100 UNIT/ML injection  Commonly known as:  NOVOLIN N  - CBG 70 - 120: 0 units  - CBG 121 - 150: 2 units  - CBG 151 - 200: 3 units  - CBG 201 - 250: 5 units  - CBG 251 - 300: 8 units  - CBG 301 - 350: 11 units  - CBG 351 - 400: 15 units     Iron 325 (65 FE) MG Tabs  Take 1 tablet by mouth 2 (two) times daily.     lisinopril 20 MG tablet  Commonly known as:  PRINIVIL,ZESTRIL  Take 1 tablet (20 mg total) by mouth daily.     metFORMIN 1000 MG tablet  Commonly known as:  GLUCOPHAGE  Take 0.5 tablets (500 mg) in the morning and 1 tablet (1000 mg) in the evening     NEXIUM 20 MG capsule  Generic drug:  esomeprazole  Take 20 mg by mouth daily at 12 noon.     sodium chloride 0.65 % Soln nasal spray  Commonly known as:  OCEAN  Place 1 spray into both nostrils 4 (four) times daily.       Allergies  Allergen Reactions  . Morphine Other (See Comments)    'took me out of this world' I had to be resuscitated  . Codeine Sulfate Other (See Comments)    GI upset and pain  . Demerol [Meperidine] Nausea And Vomiting  . Meperidine Hcl Nausea And Vomiting and Swelling  . Propoxyphene Hcl Other (See Comments)    Darvocet caused sick headache   Follow-up  Information    Follow up with [name], [name], MD. Schedule an appointment as soon as possible for a visit in 1 week.   Specialty:  Internal Medicine   Why:  Hospital follow-up   Contact information:   [address] [place] [id number] [phone]        The results of significant diagnostics from this hospitalization (including imaging, microbiology, ancillary and laboratory) are listed below for reference.    Significant Diagnostic Studies: Dg Chest 2 View  12/19/2014   CLINICAL DATA:  Fall, dizziness.  Left hip pain.  EXAM: CHEST  2 VIEW  COMPARISON:  03/29/2014.  FINDINGS: Trachea is midline. Heart size normal. Lungs are clear. No pleural  fluid. No pneumothorax.  IMPRESSION: No acute findings.   Electronically Signed   By: Leanna Battles M.D.   On: 12/19/2014 08:55   Ct Head Wo Contrast  12/19/2014   CLINICAL DATA:  Dizziness beginning at 6:00 p.m. on 12/18/2014. Status post fall times four. Initial encounter.  EXAM: CT HEAD WITHOUT CONTRAST  TECHNIQUE: Contiguous axial images were obtained from the base of the skull through the vertex without intravenous contrast.  COMPARISON:  None.  FINDINGS: There is no evidence of acute intracranial abnormality including hemorrhage, infarct, mass lesion, mass effect, midline shift or abnormal extra-axial fluid collection. Mild atrophy and chronic microvascular ischemic change are identified. The calvarium is intact. Imaged paranasal sinuses and mastoid air cells are clear.  IMPRESSION: No acute finding.   Electronically Signed   By: Drusilla Kanner M.D.   On: 12/19/2014 08:38   Mr Brain Wo Contrast  12/20/2014   CLINICAL DATA:  69 year old diabetic hypertensive female with hyperlipidemia presenting with 1 week of mild headache and occasional dizziness. Subsequent encounter.  EXAM: MRI HEAD WITHOUT CONTRAST  TECHNIQUE: Multiplanar, multiecho pulse sequences of the brain and surrounding structures were obtained without intravenous contrast.   COMPARISON:  12/19/2014 CT.  No comparison MR.  FINDINGS: No acute infarct.  No intracranial hemorrhage.  No hydrocephalus.  No intracranial mass lesion noted on this unenhanced exam.  Major intracranial vascular structures are patent.  Exophthalmos.  Post lens replacement.  Cervical spondylotic changes most notable right C2-3 facet joint.  Cervical medullary junction, pituitary region and pineal region unremarkable.  IMPRESSION: No acute infarct.  Please see above.   Electronically Signed   By: Bridgett Larsson M.D.   On: 12/20/2014 11:02   US Renal  12/19/2014   CLINICAL DATA:  Acute renal failure.  EXAM: RENAL/URINARY TRACT ULTRASOUND COMPLETE  COMPARISON:  None.  FINDINGS: Right Kidney:  Length: 10.4 cm. Echogenicity within normal limits. No mass or hydronephrosis visualized.  Left Kidney:  Length: 12.4 cm. Echogenicity within normal limits. No mass or hydronephrosis visualized.  Bladder:  Appears normal for degree of bladder distention.  IMPRESSION: Normal sonographic appearance of both kidneys. No evidence of hydronephrosis.   Electronically Signed   By: Myles Rosenthal M.D.   On: 12/19/2014 17:34   Dg Knee Complete 4 Views Left  12/19/2014   CLINICAL DATA:  Status post multiple falls last night now with left hip pain and left posterior medial knee pain  EXAM: LEFT KNEE - COMPLETE 4+ VIEW  COMPARISON:  None.  FINDINGS: The bones of the knee are adequately mineralized. There are large osteophytes associated with the tibial spines and the intercondylar notch. There is no femoral condyle nor tibial plateau fracture. The proximal fibula is intact. The medial and lateral joint compartments are reasonably well maintained. There is a large osteophyte from the superior margin of the patella. No joint effusion is evident.  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 There is no acute fracture nor dislocation.   Electronically Signed   By: David  [place]   On: 12/19/2014 08:57    Dg Hip Unilat With Pelvis Min 4 Views Left  12/19/2014   CLINICAL DATA:  Status post multiple falls last evening now with left hip pain.  EXAM: DG HIP W/ PWLVIS 4+V*L*  COMPARISON:  None.  FINDINGS: AP and lateral views of the left hip reveal the joint space to be preserved. There is no acute fracture nor dislocation. The femoral neck and intertrochanteric  and subtrochanteric regions are normal. The observed portions of the bony pelvis are unremarkable.  IMPRESSION: There is no acute or significant chronic bony abnormality of the left hip.   Electronically Signed   By: David  [place]   On: 12/19/2014 08:58    Microbiology: Recent Results (from the past 240 hour(s))  Culture, blood (routine x 2)     Status: None (Preliminary result)   Collection Time: 12/19/14  6:27 PM  Result Value Ref Range Status   Specimen Description BLOOD LEFT ARM  Final   Special Requests   Final    BOTTLES DRAWN AEROBIC AND ANAEROBIC 10CC AER,5CC ANA   Culture   Final           BLOOD CULTURE RECEIVED NO GROWTH TO DATE CULTURE WILL BE HELD FOR 5 DAYS BEFORE ISSUING A FINAL NEGATIVE REPORT Note: Culture results may be compromised due to an excessive volume of blood received in culture bottles. Performed at [facility]    Report Status PENDING  Incomplete  Culture, blood (routine x 2)     Status: None (Preliminary result)   Collection Time: 12/19/14  6:35 PM  Result Value Ref Range Status   Specimen Description BLOOD RIGHT HAND  Final   Special Requests BOTTLES DRAWN AEROBIC ONLY 8CC  Final   Culture   Final           BLOOD CULTURE RECEIVED NO GROWTH TO DATE CULTURE WILL BE HELD FOR 5 DAYS BEFORE ISSUING A FINAL NEGATIVE REPORT Performed at [facility]    Report Status PENDING  Incomplete  Clostridium Difficile by PCR     Status: None   Collection Time: 12/20/14  9:51 PM  Result Value Ref Range Status   C difficile by pcr NEGATIVE NEGATIVE Final     Labs: Basic Metabolic Panel:  Recent  Labs Lab 12/19/14 0810 12/19/14 1404 12/20/14 0547 12/21/14 0600  NA 131*  --  137 132*  K 4.4  --  4.5 4.5  CL 95*  --  105 99  CO2 24  --  25 25  GLUCOSE 200*  --  203* 220*  BUN 56*  --  28* 16  CREATININE 3.92* 2.73* 1.18* 0.77  CALCIUM 8.7  --  9.0 8.6   Liver Function Tests:  Recent Labs Lab 12/19/14 0810 12/20/14 0547  AST 21 20  ALT 28 28  ALKPHOS 98 100  BILITOT 0.3 0.4  PROT 7.4 6.8  ALBUMIN 3.9 3.4*   No results for input(s): LIPASE, AMYLASE in the last 168 hours. No results for input(s): AMMONIA in the last 168 hours. CBC:  Recent Labs Lab 12/19/14 0810 12/19/14 1404 12/20/14 0547 12/21/14 0600  WBC 11.7* 9.6 6.4 8.2  NEUTROABS 7.2  --   --   --   HGB 11.6* 11.1* 11.4* 11.5*  HCT 36.6 34.7* 35.2* 36.3  MCV 79.0 79.0 77.7* 78.4  PLT 289 246 229 227   Cardiac Enzymes:  Recent Labs Lab 12/20/14 1201 12/20/14 1445 12/20/14 2119  TROPONINI <0.03 <0.03 <0.03   BNP: BNP (last 3 results) No results for input(s): PROBNP in the last 8760 hours. CBG:  Recent Labs Lab 12/21/14 1227 12/21/14 1610 12/21/14 2212 12/22/14 0731 12/22/14 1132  GLUCAP 193* 179* 200* 192* 206*       Signed:  Edsel Petrin  [facility] 12/22/2014, 11:41 AM

## 2014-12-23 ENCOUNTER — Telehealth: Payer: Self-pay | Admitting: *Deleted

## 2014-12-23 NOTE — Telephone Encounter (Signed)
Pt return call back completed TCM call below.../lmb  Transition Care Management Follow-up Telephone Call D/C 12/22/14  How have you been since you were released from the hospital? Pt stated she is ok, still weak, but ok   Do you understand why you were in the hospital? YES, she stated she understood why she was admitted   Do you understand the discharge instrcutions? YES, we reviewed d/c summary  Items Reviewed:  Medications reviewed: YES  Allergies reviewed: YES  Dietary changes reviewed: NO  Referrals reviewed: No referrals needed   Functional Questionnaire:  Activities of Daily Living (ADLs):   She states she are independent in the following: ambulation, bathing and hygiene, feeding, grooming, toileting and dressing States she doesn't require assistance    Any transportation issues/concerns?: NO   Any patient concerns? NO   Confirmed importance and date/time of follow-up visits scheduled: Yes, made appt 12/27/14 with Dr. Dorise HissKollar, advise pt to keep appt   Confirmed with patient if condition begins to worsen call PCP or go to the ER.  YES

## 2014-12-23 NOTE — Telephone Encounter (Signed)
Called pt concerning TCM appt no answer LMOM RTC.../lmb 

## 2014-12-26 LAB — CULTURE, BLOOD (ROUTINE X 2)
Culture: NO GROWTH
Culture: NO GROWTH

## 2014-12-27 ENCOUNTER — Ambulatory Visit (INDEPENDENT_AMBULATORY_CARE_PROVIDER_SITE_OTHER): Payer: 59 | Admitting: Internal Medicine

## 2014-12-27 ENCOUNTER — Encounter: Payer: Self-pay | Admitting: Internal Medicine

## 2014-12-27 VITALS — BP 148/74 | HR 96 | Temp 98.1°F | Resp 18 | Ht 64.0 in | Wt 212.8 lb

## 2014-12-27 DIAGNOSIS — I10 Essential (primary) hypertension: Secondary | ICD-10-CM | POA: Diagnosis not present

## 2014-12-27 DIAGNOSIS — E1149 Type 2 diabetes mellitus with other diabetic neurological complication: Secondary | ICD-10-CM

## 2014-12-27 DIAGNOSIS — N179 Acute kidney failure, unspecified: Secondary | ICD-10-CM

## 2014-12-27 DIAGNOSIS — E1141 Type 2 diabetes mellitus with diabetic mononeuropathy: Secondary | ICD-10-CM | POA: Diagnosis not present

## 2014-12-27 DIAGNOSIS — IMO0002 Reserved for concepts with insufficient information to code with codable children: Secondary | ICD-10-CM

## 2014-12-27 DIAGNOSIS — E1165 Type 2 diabetes mellitus with hyperglycemia: Secondary | ICD-10-CM

## 2014-12-27 MED ORDER — INSULIN LISPRO 100 UNIT/ML (KWIKPEN): PEN_INJECTOR | SUBCUTANEOUS | Status: DC

## 2014-12-27 MED ORDER — INSULIN ISOPHANE HUMAN 100 UNIT/ML KWIKPEN: 65.0000 [IU] | PEN_INJECTOR | Freq: Every day | SUBCUTANEOUS | Status: DC

## 2014-12-27 NOTE — Progress Notes (Signed)
Pre visit review using our clinic review tool, if applicable. No additional management support is needed unless otherwise documented below in the visit note. 

## 2014-12-27 NOTE — Assessment & Plan Note (Signed)
Most recent HgA1c from hospital is best in a long time. Unclear why, although maybe she is taking her medications more regularly. Since she is doing well with the insulin will rx for humulin and humalog. If she starts to have problems could consider adding glp-1 or sglt-2 to her regimen and subtract or decrease her insulins. They would also help promote weight loss. Foot exam done today and microalbumin to creatinine ratio done.

## 2014-12-27 NOTE — Assessment & Plan Note (Signed)
Recheck BMP today. She is back taking her ACE-I and metformin. Advised to keep well hydrated.

## 2014-12-27 NOTE — Patient Instructions (Signed)
We have changed over your lantus and novalog to humulin and humalog.   The humulin is the one you take at bedtime 65 units.   The humalog is the one you take with meals and should be taking 10 units with breakfast and lunch and 12 units with dinner time.   We will check some blood and urine today for the kidneys and diabetes so I can fill out your form. We will call you when it is done and faxed in. Please be sure to have your eye doctor fill out the rest of the form.   We want to see you back in about 3 months with your meter so we can adjust your diabetes regimen.   Keep working on diet and exercise to continue losing weight as this helps your blood pressure and your diabetes.   Diabetes and Exercise Exercising regularly is important. It is not just about losing weight. It has many health benefits, such as:  Improving your overall fitness, flexibility, and endurance.  Increasing your bone density.  Helping with weight control.  Decreasing your body fat.  Increasing your muscle strength.  Reducing stress and tension.  Improving your overall health. People with diabetes who exercise gain additional benefits because exercise:  Reduces appetite.  Improves the body's use of blood sugar (glucose).  Helps lower or control blood glucose.  Decreases blood pressure.  Helps control blood lipids (such as cholesterol and triglycerides).  Improves the body's use of the hormone insulin by:  Increasing the body's insulin sensitivity.  Reducing the body's insulin needs.  Decreases the risk for heart disease because exercising:  Lowers cholesterol and triglycerides levels.  Increases the levels of good cholesterol (such as high-density lipoproteins [HDL]) in the body.  Lowers blood glucose levels. YOUR ACTIVITY PLAN  Choose an activity that you enjoy and set realistic goals. Your health care provider or diabetes educator can help you make an activity plan that works for you.  Exercise regularly as directed by your health care provider. This includes:  Performing resistance training twice a week such as push-ups, sit-ups, lifting weights, or using resistance bands.  Performing 150 minutes of cardio exercises each week such as walking, running, or playing sports.  Staying active and spending no more than 90 minutes at one time being inactive. Even short bursts of exercise are good for you. Three 10-minute sessions spread throughout the day are just as beneficial as a single 30-minute session. Some exercise ideas include:  Taking the dog for a walk.  Taking the stairs instead of the elevator.  Dancing to your favorite song.  Doing an exercise video.  Doing your favorite exercise with a friend. RECOMMENDATIONS FOR EXERCISING WITH TYPE 1 OR TYPE 2 DIABETES   Check your blood glucose before exercising. If blood glucose levels are greater than 240 mg/dL, check for urine ketones. Do not exercise if ketones are present.  Avoid injecting insulin into areas of the body that are going to be exercised. For example, avoid injecting insulin into:  The arms when playing tennis.  The legs when jogging.  Keep a record of:  Food intake before and after you exercise.  Expected peak times of insulin action.  Blood glucose levels before and after you exercise.  The type and amount of exercise you have done.  Review your records with your health care provider. Your health care provider will help you to develop guidelines for adjusting food intake and insulin amounts before and after exercising.  If  you take insulin or oral hypoglycemic agents, watch for signs and symptoms of hypoglycemia. They include:  Dizziness.  Shaking.  Sweating.  Chills.  Confusion.  Drink plenty of water while you exercise to prevent dehydration or heat stroke. Body water is lost during exercise and must be replaced.  Talk to your health care provider before starting an exercise  program to make sure it is safe for you. Remember, almost any type of activity is better than none. Document Released: 02/15/2004 Document Revised: 04/11/2014 Document Reviewed: 05/04/2013 [facility] Patient Information 2015 [employer] [place]. This information is not intended to replace advice given to you by your health care provider. Make sure you discuss any questions you have with your health care provider.

## 2014-12-27 NOTE — Assessment & Plan Note (Signed)
BP under reasonable control today and do not want to aggressively pursue today given recent episode of orthostatic hypotension.

## 2014-12-27 NOTE — Progress Notes (Signed)
   Subjective:    Patient ID: [name], female    DOB: [birth year], 69 y.o.   MRN: [id number]  HPI The patient is a 69 YO female who is coming in as a new patient who is following up on a recent hospital stay. Her stay was for dehydration, ARF, dizziness. She is doing much better and is no longer dizzy. She also has PMH of type 2 diabetes, GERD, IBS. She is out of her diabetes medicine for about 3 weeks now. She also got a letter from her insurance company that her insulin will not be covered and she needs to switch. She has also been urinating well since her return from the hospital. She denies diarrhea, constipation, chest pains, SOB. She is eating and drinking well. She is trying to watch her diet and lose some weight as this will help her health.   Review of Systems  Constitutional: Negative for fever, chills, activity change, appetite change, fatigue and unexpected weight change.  HENT: Negative.   Respiratory: Negative for cough, chest tightness, shortness of breath and wheezing.   Cardiovascular: Negative for chest pain, palpitations and leg swelling.  Gastrointestinal: Negative for abdominal pain, diarrhea, constipation, blood in stool and abdominal distention.  Musculoskeletal: Positive for arthralgias. Negative for myalgias and gait problem.  Skin: Negative.   Neurological: Negative for dizziness, syncope, weakness, light-headedness, numbness and headaches.  Psychiatric/Behavioral: Negative.       Objective:   Physical Exam  Constitutional: She is oriented to person, place, and time. She appears well-developed and well-nourished.  Overweight  HENT:  Head: Normocephalic and atraumatic.  Eyes: EOM are normal.  Neck: Normal range of motion.  Cardiovascular: Normal rate and regular rhythm.   Pulmonary/Chest: Effort normal and breath sounds normal. No respiratory distress. She has no wheezes. She has no rales.  Abdominal: Soft. Bowel sounds are normal. She exhibits no  distension. There is no tenderness. There is no rebound.  Musculoskeletal: She exhibits no edema.  Neurological: She is alert and oriented to person, place, and time. Coordination normal.  Skin: Skin is warm and dry.   Filed Vitals:   12/27/14 0931 12/27/14 1012  BP: 154/88 148/74  Pulse: 96   Temp: 98.1 F (36.7 C)   TempSrc: Oral   Resp: 18   Height: 5\' 4"  (1.626 m)   Weight: 212 lb 12.8 oz (96.525 kg)   SpO2: 95%       Assessment & Plan:

## 2015-01-17 ENCOUNTER — Ambulatory Visit: Payer: Medicare Other | Attending: Internal Medicine | Admitting: Physical Therapy

## 2015-01-17 ENCOUNTER — Encounter: Payer: Self-pay | Admitting: Physical Therapy

## 2015-01-17 DIAGNOSIS — I872 Venous insufficiency (chronic) (peripheral): Secondary | ICD-10-CM | POA: Insufficient documentation

## 2015-01-17 DIAGNOSIS — G629 Polyneuropathy, unspecified: Secondary | ICD-10-CM | POA: Diagnosis not present

## 2015-01-17 DIAGNOSIS — E785 Hyperlipidemia, unspecified: Secondary | ICD-10-CM | POA: Diagnosis not present

## 2015-01-17 DIAGNOSIS — K219 Gastro-esophageal reflux disease without esophagitis: Secondary | ICD-10-CM | POA: Insufficient documentation

## 2015-01-17 DIAGNOSIS — R269 Unspecified abnormalities of gait and mobility: Secondary | ICD-10-CM | POA: Diagnosis not present

## 2015-01-17 DIAGNOSIS — E119 Type 2 diabetes mellitus without complications: Secondary | ICD-10-CM | POA: Insufficient documentation

## 2015-01-17 DIAGNOSIS — I10 Essential (primary) hypertension: Secondary | ICD-10-CM | POA: Diagnosis not present

## 2015-01-18 ENCOUNTER — Encounter: Payer: Self-pay | Admitting: Physical Therapy

## 2015-01-18 NOTE — Therapy (Signed)
[facility] [address] [place], [place], [address] Phone: [phone]   Fax:  [phone]  Physical Therapy Evaluation  Patient Details  Name: [name] MRN: [id number] Date of Birth: 1946-04-19 Referring Provider:  [name], [name], MD  Encounter Date: 01/17/2015      PT End of Session - 01/18/15 0944    Visit Number 1   Number of Visits 1   Authorization Type [employer] Medicare   Authorization Time Period 01-17-15 - 03-09-15   PT Start Time 1325   PT Stop Time 1405   PT Time Calculation (min) 40 min      Past Medical History  Diagnosis Date  . GERD (gastroesophageal reflux disease)   . Hyperlipidemia   . Hypertension   . Diabetes mellitus     type II  . Depression   . Peripheral neuropathy   . Venous insufficiency   . Achilles tendinitis   . Right shoulder pain     Subacromial tendinitis  . Calcaneal spur     right  . Ventral hernia   . Cataract   . Diverticulosis of colon (without mention of hemorrhage) 2013  . Internal hemorrhoids 2013  . GI bleed 03/26/14  . Acute renal failure (ARF) 12/19/2014    Past Surgical History  Procedure Laterality Date  . Cholecystectomy  2001  . Abdominal hysterectomy  2001    TAH-BSO  . Incisional hernia repair    . Tonsillectomy    . Esophagogastroduodenoscopy  2013    normal   . Colonoscopy  2013    diverticulosis     There were no vitals taken for this visit.  Visit Diagnosis:  Abnormality of gait - Plan: PT plan of care cert/re-cert      Subjective Assessment - 01/18/15 0937    Symptoms Pt states she does not know why she is here - is no longer having any dizziness   Pertinent History pt. was [facility]11[facility] after sustaining a fall due to syncope; pt was diagnosed with dehydration; PMH inclues HTN and DM   Currently in Pain? No/denies          [facility] PT Assessment - 01/18/15 0001    Assessment   Medical Diagnosis  Gait Abnormality   Onset Date 12/19/14   Balance Screen   Has the patient fallen in the past 6 months Yes   How many times? 2   Has the patient had a decrease in activity level because of a fear of falling?  No   Is the patient reluctant to leave their home because of a fear of falling?  No   Prior Function   Level of Independence Independent with homemaking with ambulation   Vocation Unemployed  worked as a caregiver prior to fall   Strength   Overall Strength Within functional limits for tasks performed   Right Hip Flexion 3+/5   Left Hip Flexion 3+/5   Ambulation/Gait   Ambulation/Gait Yes   Gait velocity 3.02  no device   Standardized Balance Assessment   Standardized Balance Assessment Timed Up and Go Test   Timed Up and Go Test   Normal TUG (seconds) 10.37                          PT Education - 01/18/15 0944    Education provided Yes   Education Details Gave information on [facility] classes held at [facility]  Person(s) Educated Patient   Methods Explanation;Handout   Comprehension Verbalized understanding          PT Short Term Goals - Feb 10, 2015 0947    PT SHORT TERM GOAL #1   Title N/A due to eval only           PT Long Term Goals - 2015-02-10 0947    PT LONG TERM GOAL #1   Title N/A due to eval only               Plan - 02/10/15 0945    Clinical Impression Statement Pt. presents with proximal weakness in bil. LE's and decr. high level balance skills but pt is able to safely function; no vertigo reported at this time; pt. does not wish to participate in PT at this time due to financial concerns with co-pay required    PT Frequency 1x / week   PT Duration --  1 week - eval only   PT Next Visit Plan N/A   Consulted and Agree with Plan of Care Patient          G-Codes - 2015/02/10 0948    Functional Assessment Tool Used TUG and gait velocity   Functional Limitation Mobility: Walking and moving around   Mobility: Walking  and Moving Around Current Status [phone]) At least 1 percent but less than 20 percent impaired, limited or restricted   Mobility: Walking and Moving Around Goal Status [phone]) At least 1 percent but less than 20 percent impaired, limited or restricted   Mobility: Walking and Moving Around Discharge Status [phone]) At least 1 percent but less than 20 percent impaired, limited or restricted       Problem List Patient Active Problem List   Diagnosis Date Noted  . Acute renal failure 12/19/2014  . Syncope 12/19/2014  . Falls 12/19/2014  . Reactive airway disease 04/09/2014  . Seasonal allergic rhinitis 04/09/2014  . Preventative health care 04/09/2014  . Abnormality of gait 03/17/2013  . Irritable bowel syndrome (IBS) 08/27/2012  . Type II diabetes mellitus with neurological manifestations, uncontrolled 06/18/2007  . GERD 06/18/2007  . HYPERLIPIDEMIA 12/27/2006  . Essential hypertension 09/19/2006  . VENOUS INSUFFICIENCY 09/19/2006    Kary Kos, PT 02/10/2015, 9:54 AM  [facility] [address] [place], [place], [address] Phone: [phone]   Fax:  [phone]

## 2015-01-25 ENCOUNTER — Other Ambulatory Visit: Payer: Self-pay | Admitting: Geriatric Medicine

## 2015-01-25 MED ORDER — LISINOPRIL 20 MG PO TABS: 20.0000 mg | ORAL_TABLET | Freq: Every day | ORAL | Status: DC

## 2015-01-25 MED ORDER — FUROSEMIDE 20 MG PO TABS: 20.0000 mg | ORAL_TABLET | Freq: Every day | ORAL | Status: DC | PRN

## 2015-03-13 ENCOUNTER — Other Ambulatory Visit: Payer: Self-pay | Admitting: Geriatric Medicine

## 2015-03-13 MED ORDER — INSULIN PEN NEEDLE 29G X 12MM MISC: Status: DC

## 2015-03-23 ENCOUNTER — Other Ambulatory Visit: Payer: Self-pay | Admitting: Internal Medicine

## 2015-03-27 ENCOUNTER — Other Ambulatory Visit: Payer: Self-pay | Admitting: Internal Medicine

## 2015-03-28 ENCOUNTER — Ambulatory Visit: Payer: 59 | Admitting: Internal Medicine

## 2015-04-06 ENCOUNTER — Ambulatory Visit (INDEPENDENT_AMBULATORY_CARE_PROVIDER_SITE_OTHER): Payer: Medicare Other | Admitting: Internal Medicine

## 2015-04-06 ENCOUNTER — Encounter: Payer: Self-pay | Admitting: Internal Medicine

## 2015-04-06 VITALS — BP 158/84 | HR 79 | Temp 98.2°F | Resp 12 | Wt 213.0 lb

## 2015-04-06 DIAGNOSIS — E1141 Type 2 diabetes mellitus with diabetic mononeuropathy: Secondary | ICD-10-CM | POA: Diagnosis not present

## 2015-04-06 DIAGNOSIS — I10 Essential (primary) hypertension: Secondary | ICD-10-CM | POA: Diagnosis not present

## 2015-04-06 DIAGNOSIS — E1149 Type 2 diabetes mellitus with other diabetic neurological complication: Secondary | ICD-10-CM

## 2015-04-06 DIAGNOSIS — E1165 Type 2 diabetes mellitus with hyperglycemia: Secondary | ICD-10-CM

## 2015-04-06 DIAGNOSIS — N179 Acute kidney failure, unspecified: Secondary | ICD-10-CM | POA: Diagnosis not present

## 2015-04-06 DIAGNOSIS — IMO0002 Reserved for concepts with insufficient information to code with codable children: Secondary | ICD-10-CM

## 2015-04-06 MED ORDER — CITALOPRAM HYDROBROMIDE 20 MG PO TABS: 20.0000 mg | ORAL_TABLET | Freq: Every day | ORAL | Status: DC

## 2015-04-06 MED ORDER — GABAPENTIN 300 MG PO CAPS: 300.0000 mg | ORAL_CAPSULE | Freq: Three times a day (TID) | ORAL | Status: DC

## 2015-04-06 MED ORDER — METFORMIN HCL 1000 MG PO TABS: ORAL_TABLET | ORAL | Status: DC

## 2015-04-06 MED ORDER — INSULIN PEN NEEDLE 31G X 8 MM MISC: Status: DC

## 2015-04-06 MED ORDER — FUROSEMIDE 20 MG PO TABS: 20.0000 mg | ORAL_TABLET | Freq: Every day | ORAL | Status: DC | PRN

## 2015-04-06 MED ORDER — ESOMEPRAZOLE MAGNESIUM 20 MG PO CPDR: 20.0000 mg | DELAYED_RELEASE_CAPSULE | Freq: Every day | ORAL | Status: DC

## 2015-04-06 MED ORDER — DICYCLOMINE HCL 20 MG PO TABS: ORAL_TABLET | ORAL | Status: DC

## 2015-04-06 MED ORDER — LISINOPRIL 20 MG PO TABS: 20.0000 mg | ORAL_TABLET | Freq: Every day | ORAL | Status: DC

## 2015-04-06 NOTE — Progress Notes (Signed)
Pre visit review using our clinic review tool, if applicable. No additional management support is needed unless otherwise documented below in the visit note. 

## 2015-04-06 NOTE — Patient Instructions (Signed)
We will check on your labs today and call you back with the results.   We have sent in the citalopram which will help the gabapentin to work better. Take the citalopram once a day and it may take 2-3 weeks to kick in and help but should do well.   Keep working on exercising as it is good for diabetes as well as stress.   Stress and Stress Management Stress is a normal reaction to life events. It is what you feel when life demands more than you are used to or more than you can handle. Some stress can be useful. For example, the stress reaction can help you catch the last bus of the day, study for a test, or meet a deadline at work. But stress that occurs too often or for too long can cause problems. It can affect your emotional health and interfere with relationships and normal daily activities. Too much stress can weaken your immune system and increase your risk for physical illness. If you already have a medical problem, stress can make it worse. CAUSES  All sorts of life events may cause stress. An event that causes stress for one person may not be stressful for another person. Major life events commonly cause stress. These may be positive or negative. Examples include losing your job, moving into a new home, getting married, having a baby, or losing a loved one. Less obvious life events may also cause stress, especially if they occur day after day or in combination. Examples include working long hours, driving in traffic, caring for children, being in debt, or being in a difficult relationship. SIGNS AND SYMPTOMS Stress may cause emotional symptoms including, the following:  Anxiety. This is feeling worried, afraid, on edge, overwhelmed, or out of control.  Anger. This is feeling irritated or impatient.  Depression. This is feeling sad, down, helpless, or guilty.  Difficulty focusing, remembering, or making decisions. Stress may cause physical symptoms, including the following:   Aches and  pains. These may affect your head, neck, back, stomach, or other areas of your body.  Tight muscles or clenched jaw.  Low energy or trouble sleeping. Stress may cause unhealthy behaviors, including the following:   Eating to feel better (overeating) or skipping meals.  Sleeping too little, too much, or both.  Working too much or putting off tasks (procrastination).  Smoking, drinking alcohol, or using drugs to feel better. DIAGNOSIS  Stress is diagnosed through an assessment by your health care provider. Your health care provider will ask questions about your symptoms and any stressful life events.Your health care provider will also ask about your medical history and may order blood tests or other tests. Certain medical conditions and medicine can cause physical symptoms similar to stress. Mental illness can cause emotional symptoms and unhealthy behaviors similar to stress. Your health care provider may refer you to a mental health professional for further evaluation.  TREATMENT  Stress management is the recommended treatment for stress.The goals of stress management are reducing stressful life events and coping with stress in healthy ways.  Techniques for reducing stressful life events include the following:  Stress identification. Self-monitor for stress and identify what causes stress for you. These skills may help you to avoid some stressful events.  Time management. Set your priorities, keep a calendar of events, and learn to say "no." These tools can help you avoid making too many commitments. Techniques for coping with stress include the following:  Rethinking the problem. Try to  think realistically about stressful events rather than ignoring them or overreacting. Try to find the positives in a stressful situation rather than focusing on the negatives.  Exercise. Physical exercise can release both physical and emotional tension. The key is to find a form of exercise you enjoy  and do it regularly.  Relaxation techniques. These relax the body and mind. Examples include yoga, meditation, tai chi, biofeedback, deep breathing, progressive muscle relaxation, listening to music, being out in nature, journaling, and other hobbies. Again, the key is to find one or more that you enjoy and can do regularly.  Healthy lifestyle. Eat a balanced diet, get plenty of sleep, and do not smoke. Avoid using alcohol or drugs to relax.  Strong support network. Spend time with family, friends, or other people you enjoy being around.Express your feelings and talk things over with someone you trust. Counseling or talktherapy with a mental health professional may be helpful if you are having difficulty managing stress on your own. Medicine is typically not recommended for the treatment of stress.Talk to your health care provider if you think you need medicine for symptoms of stress. HOME CARE INSTRUCTIONS  Keep all follow-up visits as directed by your health care provider.  Take all medicines as directed by your health care provider. SEEK MEDICAL CARE IF:  Your symptoms get worse or you start having new symptoms.  You feel overwhelmed by your problems and can no longer manage them on your own. SEEK IMMEDIATE MEDICAL CARE IF:  You feel like hurting yourself or someone else. Document Released: 05/21/2001 Document Revised: 04/11/2014 Document Reviewed: 07/20/2013 [facility] Patient Information 2015 [place], [place]. This information is not intended to replace advice given to you by your health care provider. Make sure you discuss any questions you have with your health care provider.

## 2015-04-07 ENCOUNTER — Other Ambulatory Visit (INDEPENDENT_AMBULATORY_CARE_PROVIDER_SITE_OTHER): Payer: Medicare Other

## 2015-04-07 DIAGNOSIS — E1141 Type 2 diabetes mellitus with diabetic mononeuropathy: Secondary | ICD-10-CM | POA: Diagnosis not present

## 2015-04-07 DIAGNOSIS — IMO0002 Reserved for concepts with insufficient information to code with codable children: Secondary | ICD-10-CM

## 2015-04-07 DIAGNOSIS — E1165 Type 2 diabetes mellitus with hyperglycemia: Secondary | ICD-10-CM

## 2015-04-07 DIAGNOSIS — E1149 Type 2 diabetes mellitus with other diabetic neurological complication: Secondary | ICD-10-CM

## 2015-04-07 LAB — COMPREHENSIVE METABOLIC PANEL
ALT: 23 U/L (ref 0–35)
AST: 18 U/L (ref 0–37)
Albumin: 3.6 g/dL (ref 3.5–5.2)
Alkaline Phosphatase: 95 U/L (ref 39–117)
BUN: 9 mg/dL (ref 6–23)
CO2: 30 mEq/L (ref 19–32)
Calcium: 9.3 mg/dL (ref 8.4–10.5)
Chloride: 100 mEq/L (ref 96–112)
Creatinine, Ser: 0.64 mg/dL (ref 0.40–1.20)
GFR: 98.05 mL/min (ref >60.00)
Glucose, Bld: 205 mg/dL — ABNORMAL HIGH (ref 70–99)
Potassium: 4.3 mEq/L (ref 3.5–5.1)
Sodium: 136 mEq/L (ref 135–145)
Total Bilirubin: 0.4 mg/dL (ref 0.2–1.2)
Total Protein: 7 g/dL (ref 6.0–8.3)

## 2015-04-07 LAB — LDL CHOLESTEROL, DIRECT: Direct LDL: 154 mg/dL

## 2015-04-07 LAB — LIPID PANEL
Cholesterol: 227 mg/dL — ABNORMAL HIGH (ref 0–200)
HDL: 36.2 mg/dL — ABNORMAL LOW (ref >39.00)
NonHDL: 190.8
Total CHOL/HDL Ratio: 6
Triglycerides: 222 mg/dL — ABNORMAL HIGH (ref 0.0–149.0)
VLDL: 44.4 mg/dL — ABNORMAL HIGH (ref 0.0–40.0)

## 2015-04-07 LAB — HEMOGLOBIN A1C: Hgb A1c MFr Bld: 8.8 % — ABNORMAL HIGH (ref 4.6–6.5)

## 2015-04-09 NOTE — Assessment & Plan Note (Signed)
She is not taking her medications regularly right now and likely why her pressure is elevated. She was previously well controlled on same so she will work on increasing compliance to keep her health intact. Checking labs today.

## 2015-04-09 NOTE — Assessment & Plan Note (Signed)
Suspect that her diabetes will be less controlled as she is missing doses of medications. She will work to take them more regularly but the increased stress in her life is holding her back right now. She will also work on exercising more often which has the benefit of stress reduction and managing her diabetes better. Continue metformin, lantus. If HgA1c worse will try to start another oral agent for improved control. On ACE-I.

## 2015-04-09 NOTE — Progress Notes (Signed)
   Subjective:    Patient ID: [name], female    DOB: 03/17/1946, 69 y.o.   MRN: [id number]  HPI The patient is a 69 YO female who is coming in for follow up of her diabetes but she has some other stressors in her life right now. She is having more stress and anxiety in her life which is causing her to not work out and to eat poorly. She has also forgotten her medicines more often lately although she is still trying. She thinks maybe her sugars have been higher since she has been eating differently. She does not have a large support network although she does have several children but they live far away. Denies chest pains, increased urination, SOB, nausea.   Review of Systems  Constitutional: Negative for fever, chills, activity change, appetite change, fatigue and unexpected weight change.  Respiratory: Negative for cough, chest tightness, shortness of breath and wheezing.   Cardiovascular: Negative for chest pain, palpitations and leg swelling.  Gastrointestinal: Negative for abdominal pain, diarrhea, constipation, blood in stool and abdominal distention.  Musculoskeletal: Positive for arthralgias. Negative for myalgias and gait problem.  Neurological: Negative for dizziness, syncope, weakness, light-headedness, numbness and headaches.  Psychiatric/Behavioral: Positive for dysphoric mood.      Objective:   Physical Exam  Constitutional: She is oriented to person, place, and time. She appears well-developed and well-nourished.  Overweight  HENT:  Head: Normocephalic and atraumatic.  Eyes: EOM are normal.  Neck: Normal range of motion.  Cardiovascular: Normal rate and regular rhythm.   Pulmonary/Chest: Effort normal and breath sounds normal. No respiratory distress. She has no wheezes. She has no rales.  Abdominal: Soft. Bowel sounds are normal. She exhibits no distension. There is no tenderness. There is no rebound.  Musculoskeletal: She exhibits no edema.  Neurological:  She is alert and oriented to person, place, and time. Coordination normal.  Skin: Skin is warm and dry.   Filed Vitals:   04/06/15 1059  BP: 158/84  Pulse: 79  Temp: 98.2 F (36.8 C)  TempSrc: Oral  Resp: 12  Weight: 213 lb (96.616 kg)  SpO2: 95%      Assessment & Plan:

## 2015-04-09 NOTE — Assessment & Plan Note (Signed)
She never got her labs drawn after last visit and talked to her about the importance of getting those done today.

## 2015-05-05 ENCOUNTER — Other Ambulatory Visit: Payer: Self-pay | Admitting: Internal Medicine

## 2015-05-23 ENCOUNTER — Other Ambulatory Visit: Payer: Self-pay | Admitting: Internal Medicine

## 2015-05-23 DIAGNOSIS — Z1231 Encounter for screening mammogram for malignant neoplasm of breast: Secondary | ICD-10-CM

## 2015-05-25 ENCOUNTER — Ambulatory Visit (HOSPITAL_COMMUNITY)
Admission: RE | Admit: 2015-05-25 | Discharge: 2015-05-25 | Disposition: A | Discharge status: Home / Self Care | Payer: Medicare Other | Source: Ambulatory Visit | Attending: Internal Medicine | Admitting: Internal Medicine

## 2015-05-25 DIAGNOSIS — Z1231 Encounter for screening mammogram for malignant neoplasm of breast: Secondary | ICD-10-CM | POA: Insufficient documentation

## 2015-06-09 ENCOUNTER — Telehealth: Payer: Self-pay | Admitting: Internal Medicine

## 2015-06-09 NOTE — Telephone Encounter (Signed)
Patient stated that mammogram has been done.

## 2015-06-09 NOTE — Telephone Encounter (Signed)
Called patient to see if a recent mammogram has been done left voicemail. ° °

## 2015-07-07 ENCOUNTER — Encounter: Payer: Self-pay | Admitting: Internal Medicine

## 2015-07-07 ENCOUNTER — Ambulatory Visit (INDEPENDENT_AMBULATORY_CARE_PROVIDER_SITE_OTHER): Payer: Medicare Other | Admitting: Internal Medicine

## 2015-07-07 VITALS — BP 138/78 | HR 95 | Temp 98.3°F | Resp 16 | Ht 64.0 in | Wt 212.8 lb

## 2015-07-07 DIAGNOSIS — E1141 Type 2 diabetes mellitus with diabetic mononeuropathy: Secondary | ICD-10-CM

## 2015-07-07 DIAGNOSIS — E118 Type 2 diabetes mellitus with unspecified complications: Secondary | ICD-10-CM | POA: Diagnosis not present

## 2015-07-07 DIAGNOSIS — K589 Irritable bowel syndrome without diarrhea: Secondary | ICD-10-CM

## 2015-07-07 DIAGNOSIS — L989 Disorder of the skin and subcutaneous tissue, unspecified: Secondary | ICD-10-CM

## 2015-07-07 DIAGNOSIS — E1165 Type 2 diabetes mellitus with hyperglycemia: Secondary | ICD-10-CM

## 2015-07-07 DIAGNOSIS — E1149 Type 2 diabetes mellitus with other diabetic neurological complication: Secondary | ICD-10-CM

## 2015-07-07 DIAGNOSIS — IMO0002 Reserved for concepts with insufficient information to code with codable children: Secondary | ICD-10-CM

## 2015-07-07 DIAGNOSIS — I10 Essential (primary) hypertension: Secondary | ICD-10-CM

## 2015-07-07 NOTE — Progress Notes (Signed)
Pre visit review using our clinic review tool, if applicable. No additional management support is needed unless otherwise documented below in the visit note. 

## 2015-07-07 NOTE — Assessment & Plan Note (Signed)
BP at goal on lasix and lisinopril. Checking BMP today. Adjust as appropriate.

## 2015-07-07 NOTE — Patient Instructions (Signed)
We will check on the HgA1c today and may need to adjust your regimen. We really do want you to work on checking the sugar in the morning even if you have to check after using the bathroom. This helps [place] to be able to adjust your medicines.   Diabetes and Standards of Medical Care Diabetes is complicated. You may find that your diabetes team includes a dietitian, nurse, diabetes educator, eye doctor, and more. To help everyone know what is going on and to help you get the care you deserve, the following schedule of care was developed to help keep you on track. Below are the tests, exams, vaccines, medicines, education, and plans you will need. HbA1c test This test shows how well you have controlled your glucose over the past 2-3 months. It is used to see if your diabetes management plan needs to be adjusted.   It is performed at least 2 times a year if you are meeting treatment goals.  It is performed 4 times a year if therapy has changed or if you are not meeting treatment goals. Blood pressure test  This test is performed at every routine medical visit. The goal is less than 140/90 mm Hg for most people, but 130/80 mm Hg in some cases. Ask your health care provider about your goal. Dental exam  Follow up with the dentist regularly. Eye exam  If you are diagnosed with type 1 diabetes as a child, get an exam upon reaching the age of 10 years or older and have had diabetes for 3-5 years. Yearly eye exams are recommended after that initial eye exam.  If you are diagnosed with type 1 diabetes as an adult, get an exam within 5 years of diagnosis and then yearly.  If you are diagnosed with type 2 diabetes, get an exam as soon as possible after the diagnosis and then yearly. Foot care exam  Visual foot exams are performed at every routine medical visit. The exams check for cuts, injuries, or other problems with the feet.  A comprehensive foot exam should be done yearly. This includes visual  inspection as well as assessing foot pulses and testing for loss of sensation.  Check your feet nightly for cuts, injuries, or other problems with your feet. Tell your health care provider if anything is not healing. Kidney function test (urine microalbumin)  This test is performed once a year.  Type 1 diabetes: The first test is performed 5 years after diagnosis.  Type 2 diabetes: The first test is performed at the time of diagnosis.  A serum creatinine and estimated glomerular filtration rate (eGFR) test is done once a year to assess the level of chronic kidney disease (CKD), if present. Lipid profile (cholesterol, HDL, LDL, triglycerides)  Performed every 5 years for most people.  The goal for LDL is less than 100 mg/dL. If you are at high risk, the goal is less than 70 mg/dL.  The goal for HDL is 40 mg/dL-50 mg/dL for men and 50 JT/CL-24 mg/dL for women. An HDL cholesterol of 60 mg/dL or higher gives some protection against heart disease.  The goal for triglycerides is less than 150 mg/dL. Influenza vaccine, pneumococcal vaccine, and hepatitis B vaccine  The influenza vaccine is recommended yearly.  It is recommended that people with diabetes who are over 67 years old get the pneumonia vaccine. In some cases, two separate shots may be given. Ask your health care provider if your pneumonia vaccination is up to date.  The hepatitis B vaccine is also recommended for adults with diabetes. Diabetes self-management education  Education is recommended at diagnosis and ongoing as needed. Treatment plan  Your treatment plan is reviewed at every medical visit. Document Released: 09/22/2009 Document Revised: 04/11/2014 Document Reviewed: 04/27/2013 [facility] Patient Information 2015 [place], [place]. This information is not intended to replace advice given to you by your health care provider. Make sure you discuss any questions you have with your health care provider.

## 2015-07-07 NOTE — Progress Notes (Signed)
   Subjective:    Patient ID: [name], female    DOB: 1946-12-06, 69 y.o.   MRN: [id number]  HPI The patient is a 69 YO female who is coming in for follow up of her diabetes. She is still taking her medicines and is remembering better lately. She recently had a death in the family and there have been a lot of casseroles which she knows are not good for her sugars but she does not like to waste food. She has had sugars mostly around 170 but none above 200 recently. She does not check her sugars often. She does not remember to check in the morning as she has to go to the bathroom urgently when she gets up to move her bowels. She is still struggling with that about the same. She is still having fairly severe neuropathy in her feet which is present most of the time. Gabapentin helps some but does not take away the pain. She did not bring her sugar log with her. She is not exercising due to the pain in her feet.   Review of Systems  Constitutional: Negative for fever, chills, activity change, appetite change, fatigue and unexpected weight change.  Respiratory: Negative for cough, chest tightness, shortness of breath and wheezing.   Cardiovascular: Negative for chest pain, palpitations and leg swelling.  Gastrointestinal: Negative for abdominal pain, diarrhea, constipation, blood in stool and abdominal distention.  Musculoskeletal: Positive for arthralgias. Negative for myalgias and gait problem.  Skin: Negative.   Neurological: Positive for numbness. Negative for dizziness, syncope, weakness, light-headedness and headaches.  Psychiatric/Behavioral: Positive for dysphoric mood.      Objective:   Physical Exam  Constitutional: She is oriented to person, place, and time. She appears well-developed and well-nourished.  Overweight  HENT:  Head: Normocephalic and atraumatic.  Eyes: EOM are normal.  Neck: Normal range of motion.  Cardiovascular: Normal rate and regular rhythm.     Pulmonary/Chest: Effort normal and breath sounds normal. No respiratory distress. She has no wheezes. She has no rales.  Abdominal: Soft. Bowel sounds are normal. She exhibits no distension. There is no tenderness. There is no rebound.  Musculoskeletal: She exhibits no edema.  Neurological: She is alert and oriented to person, place, and time. Coordination normal.  Skin: Skin is warm and dry.   Filed Vitals:   07/07/15 1042  BP: 138/78  Pulse: 95  Temp: 98.3 F (36.8 C)  TempSrc: Oral  Resp: 16  Height:  (1.626 m)  Weight: 212 lb 12.8 oz (96.525 kg)  SpO2: 95%      Assessment & Plan:

## 2015-07-07 NOTE — Assessment & Plan Note (Signed)
She is taking insulins but not checking her sugars regularly which can be dangerous. She does not check in the morning so unclear if we need to adjust her long acting insulin. Checking HgA1c today, last was 8.8 and she was not reliably taking her insulin at that time. Denies hypoglycemia and also denies sugars >200 but unclear if she is checking enough to catch those. Have talked to her again about exercise including water aerobics as a way to condition as well as lose weight. This would also be helpful for her stressful living situation.

## 2015-07-07 NOTE — Assessment & Plan Note (Signed)
She is not taking the bentyl regularly. Talked to her about diet to help with decreasing bowel movements such as adding fiber to her diet. She has seen GI in the past and may be reasonable for her to return but she does not feel able to at this time.

## 2015-07-10 ENCOUNTER — Other Ambulatory Visit (INDEPENDENT_AMBULATORY_CARE_PROVIDER_SITE_OTHER): Payer: Medicare Other

## 2015-07-10 DIAGNOSIS — E1165 Type 2 diabetes mellitus with hyperglycemia: Secondary | ICD-10-CM

## 2015-07-10 DIAGNOSIS — IMO0002 Reserved for concepts with insufficient information to code with codable children: Secondary | ICD-10-CM

## 2015-07-10 DIAGNOSIS — E118 Type 2 diabetes mellitus with unspecified complications: Secondary | ICD-10-CM

## 2015-07-10 LAB — BASIC METABOLIC PANEL
BUN: 12 mg/dL (ref 6–23)
CO2: 29 mEq/L (ref 19–32)
Calcium: 9.3 mg/dL (ref 8.4–10.5)
Chloride: 96 mEq/L (ref 96–112)
Creatinine, Ser: 0.76 mg/dL (ref 0.40–1.20)
GFR: 80.35 mL/min (ref >60.00)
Glucose, Bld: 331 mg/dL — ABNORMAL HIGH (ref 70–99)
Potassium: 5 mEq/L (ref 3.5–5.1)
Sodium: 134 mEq/L — ABNORMAL LOW (ref 135–145)

## 2015-07-10 LAB — HEMOGLOBIN A1C: Hgb A1c MFr Bld: 10 % — ABNORMAL HIGH (ref 4.6–6.5)

## 2015-07-11 LAB — HM DIABETES EYE EXAM

## 2015-07-12 ENCOUNTER — Encounter: Payer: Self-pay | Admitting: Internal Medicine

## 2015-07-12 LAB — HM DIABETES EYE EXAM

## 2015-07-17 ENCOUNTER — Telehealth: Payer: Self-pay | Admitting: Internal Medicine

## 2015-07-17 NOTE — Telephone Encounter (Signed)
Rec'd from [facility] forward 3 pages to Dr. Dorise Hiss

## 2015-07-20 ENCOUNTER — Encounter: Payer: Self-pay | Admitting: Internal Medicine

## 2015-08-08 ENCOUNTER — Ambulatory Visit (INDEPENDENT_AMBULATORY_CARE_PROVIDER_SITE_OTHER): Payer: Medicare Other | Admitting: Internal Medicine

## 2015-08-08 ENCOUNTER — Encounter: Payer: Self-pay | Admitting: Internal Medicine

## 2015-08-08 ENCOUNTER — Other Ambulatory Visit (INDEPENDENT_AMBULATORY_CARE_PROVIDER_SITE_OTHER): Payer: Medicare Other

## 2015-08-08 VITALS — BP 122/82 | HR 79 | Temp 98.0°F | Resp 12 | Ht 65.0 in | Wt 215.0 lb

## 2015-08-08 DIAGNOSIS — E1165 Type 2 diabetes mellitus with hyperglycemia: Secondary | ICD-10-CM

## 2015-08-08 DIAGNOSIS — IMO0002 Reserved for concepts with insufficient information to code with codable children: Secondary | ICD-10-CM

## 2015-08-08 DIAGNOSIS — I10 Essential (primary) hypertension: Secondary | ICD-10-CM

## 2015-08-08 DIAGNOSIS — E1141 Type 2 diabetes mellitus with diabetic mononeuropathy: Secondary | ICD-10-CM

## 2015-08-08 DIAGNOSIS — R5383 Other fatigue: Secondary | ICD-10-CM | POA: Diagnosis not present

## 2015-08-08 DIAGNOSIS — E1149 Type 2 diabetes mellitus with other diabetic neurological complication: Secondary | ICD-10-CM

## 2015-08-08 LAB — CBC
HCT: 41.8 % (ref 36.0–46.0)
Hemoglobin: 13.9 g/dL (ref 12.0–15.0)
MCHC: 33.3 g/dL (ref 30.0–36.0)
MCV: 80.2 fl (ref 78.0–100.0)
Platelets: 292 10*3/uL (ref 150.0–400.0)
RBC: 5.21 Mil/uL — ABNORMAL HIGH (ref 3.87–5.11)
RDW: 14.7 % (ref 11.5–15.5)
WBC: 9 10*3/uL (ref 4.0–10.5)

## 2015-08-08 LAB — TSH: TSH: 2.15 u[IU]/mL (ref 0.35–4.50)

## 2015-08-08 MED ORDER — INSULIN ISOPHANE HUMAN 100 UNIT/ML KWIKPEN: 75.0000 [IU] | PEN_INJECTOR | Freq: Every day | SUBCUTANEOUS | Status: DC

## 2015-08-08 MED ORDER — INSULIN LISPRO 100 UNIT/ML (KWIKPEN): PEN_INJECTOR | SUBCUTANEOUS | Status: DC

## 2015-08-08 NOTE — Progress Notes (Signed)
   Subjective:    Patient ID: [name], female    DOB: 10/11/1946, 69 y.o.   MRN: [id number]  HPI The patient is a 69 YO female coming in for follow up of her uncontrolled sugars. She was advised to increase her insulin with meals to 12 with breakfast and lunch and 14 with dinner. She has been able to do that and has been trying to remember that. Not exercising. She tripped into a chair recently and has broken several front teeth. She has been having problems with them for a long time and has been saving to get them taken out and replacement teeth made. Will be able to get that done soon. She is only checking sugars in the morning and still fairly high. She has been around 200 most mornings. Trying to do better with her diet and has noticed small benefits with that.   Review of Systems  Constitutional: Negative for fever, chills, activity change, appetite change, fatigue and unexpected weight change.  HENT: Positive for dental problem.   Respiratory: Negative for cough, chest tightness, shortness of breath and wheezing.   Cardiovascular: Negative for chest pain, palpitations and leg swelling.  Gastrointestinal: Negative for abdominal pain, diarrhea, constipation, blood in stool and abdominal distention.  Musculoskeletal: Positive for arthralgias. Negative for myalgias and gait problem.  Skin: Negative.   Neurological: Positive for numbness. Negative for dizziness, syncope, weakness, light-headedness and headaches.  Psychiatric/Behavioral: Positive for dysphoric mood.      Objective:   Physical Exam  Constitutional: She is oriented to person, place, and time. She appears well-developed and well-nourished.  Overweight  HENT:  Head: Normocephalic and atraumatic.  Multiple broken teeth  Eyes: EOM are normal.  Neck: Normal range of motion.  Cardiovascular: Normal rate and regular rhythm.   Pulmonary/Chest: Effort normal and breath sounds normal. No respiratory distress. She has no  wheezes. She has no rales.  Abdominal: Soft. Bowel sounds are normal. She exhibits no distension. There is no tenderness. There is no rebound.  Musculoskeletal: She exhibits no edema.  Neurological: She is alert and oriented to person, place, and time. Coordination normal.  Skin: Skin is warm and dry.   Filed Vitals:   08/08/15 1009  BP: 122/82  Pulse: 79  Temp: 98 F (36.7 C)  TempSrc: Oral  Resp: 12  Height:  (1.651 m)  Weight: 215 lb (97.523 kg)  SpO2: 94%      Assessment & Plan:

## 2015-08-08 NOTE — Assessment & Plan Note (Addendum)
She has increased humalog to 12 units with breakfast and lunch and 14 units with dinner. Has not noticed a difference. Unclear what her compliance is as she does not ever bring sugar logs and admits to having problems with remembering. Looking at her pharmacy fill her supply should be lasting about 4-5 weeks and she is filling about every 2-2.5 months which means compliance is not good. Will increase her long acting NPH to 75 units daily from 65 since most sugars fasting are still high 100s. Return in about 2-3 months for follow up and HgA1c. In the meantime she is asked to be diligent about checking her sugars and call back with high or low sugars for adjustment. Also asked her again to work on using silver sneakers to do some balance and stamina training. She is severely exacerbated with poor control overall and neurological complications which have worsened in the last 1 year.

## 2015-08-08 NOTE — Assessment & Plan Note (Signed)
BP at goal on lasix and lisinopril and checking labs today. Adjust as needed.

## 2015-08-08 NOTE — Progress Notes (Signed)
Pre visit review using our clinic review tool, if applicable. No additional management support is needed unless otherwise documented below in the visit note. 

## 2015-08-08 NOTE — Patient Instructions (Signed)
We are going to increase your insulin to 15 units of humalog with meals and 75 units per day of the long acting NPH insulin.   Keep track of the sugars in the morning and call us with the results in 1-2 weeks.   Come back in about 2-3 months for a visit. If you are having morning sugars >180 or after meal sugars of >200 call the office for advice about your insulin.   With your medicare you are able to go a [employer] or gym for free through the silver sneakers program and a lot of them have classes for increasing balance and stamina which would be good for you to try.  Diabetes and Exercise Exercising regularly is important. It is not just about losing weight. It has many health benefits, such as:  Improving your overall fitness, flexibility, and endurance.  Increasing your bone density.  Helping with weight control.  Decreasing your body fat.  Increasing your muscle strength.  Reducing stress and tension.  Improving your overall health. People with diabetes who exercise gain additional benefits because exercise:  Reduces appetite.  Improves the body's use of blood sugar (glucose).  Helps lower or control blood glucose.  Decreases blood pressure.  Helps control blood lipids (such as cholesterol and triglycerides).  Improves the body's use of the hormone insulin by:  Increasing the body's insulin sensitivity.  Reducing the body's insulin needs.  Decreases the risk for heart disease because exercising:  Lowers cholesterol and triglycerides levels.  Increases the levels of good cholesterol (such as high-density lipoproteins [HDL]) in the body.  Lowers blood glucose levels. YOUR ACTIVITY PLAN  Choose an activity that you enjoy and set realistic goals. Your health care provider or diabetes educator can help you make an activity plan that works for you. Exercise regularly as directed by your health care provider. This includes:  Performing resistance training twice a week  such as push-ups, sit-ups, lifting weights, or using resistance bands.  Performing 150 minutes of cardio exercises each week such as walking, running, or playing sports.  Staying active and spending no more than 90 minutes at one time being inactive. Even short bursts of exercise are good for you. Three 10-minute sessions spread throughout the day are just as beneficial as a single 30-minute session. Some exercise ideas include:  Taking the dog for a walk.  Taking the stairs instead of the elevator.  Dancing to your favorite song.  Doing an exercise video.  Doing your favorite exercise with a friend. RECOMMENDATIONS FOR EXERCISING WITH TYPE 1 OR TYPE 2 DIABETES   Check your blood glucose before exercising. If blood glucose levels are greater than 240 mg/dL, check for urine ketones. Do not exercise if ketones are present.  Avoid injecting insulin into areas of the body that are going to be exercised. For example, avoid injecting insulin into:  The arms when playing tennis.  The legs when jogging.  Keep a record of:  Food intake before and after you exercise.  Expected peak times of insulin action.  Blood glucose levels before and after you exercise.  The type and amount of exercise you have done.  Review your records with your health care provider. Your health care provider will help you to develop guidelines for adjusting food intake and insulin amounts before and after exercising.  If you take insulin or oral hypoglycemic agents, watch for signs and symptoms of hypoglycemia. They include:  Dizziness.  Shaking.  Sweating.  Chills.  Confusion.  Drink plenty of water while you exercise to prevent dehydration or heat stroke. Body water is lost during exercise and must be replaced.  Talk to your health care provider before starting an exercise program to make sure it is safe for you. Remember, almost any type of activity is better than none. Document Released:  02/15/2004 Document Revised: 04/11/2014 Document Reviewed: 05/04/2013 [facility] Patient Information 2015 [place], [place]. This information is not intended to replace advice given to you by your health care provider. Make sure you discuss any questions you have with your health care provider.

## 2015-08-27 ENCOUNTER — Other Ambulatory Visit: Payer: Self-pay | Admitting: Internal Medicine

## 2015-09-24 ENCOUNTER — Other Ambulatory Visit: Payer: Self-pay | Admitting: Internal Medicine

## 2015-10-09 ENCOUNTER — Ambulatory Visit (INDEPENDENT_AMBULATORY_CARE_PROVIDER_SITE_OTHER): Payer: Medicare Other | Admitting: Internal Medicine

## 2015-10-09 ENCOUNTER — Other Ambulatory Visit (INDEPENDENT_AMBULATORY_CARE_PROVIDER_SITE_OTHER): Payer: Medicare Other

## 2015-10-09 ENCOUNTER — Encounter: Payer: Self-pay | Admitting: Internal Medicine

## 2015-10-09 VITALS — BP 124/90 | HR 73 | Temp 98.1°F | Resp 12 | Ht 64.0 in | Wt 212.4 lb

## 2015-10-09 DIAGNOSIS — E1165 Type 2 diabetes mellitus with hyperglycemia: Secondary | ICD-10-CM

## 2015-10-09 DIAGNOSIS — E1142 Type 2 diabetes mellitus with diabetic polyneuropathy: Secondary | ICD-10-CM | POA: Diagnosis not present

## 2015-10-09 DIAGNOSIS — E1140 Type 2 diabetes mellitus with diabetic neuropathy, unspecified: Secondary | ICD-10-CM | POA: Diagnosis not present

## 2015-10-09 DIAGNOSIS — Z23 Encounter for immunization: Secondary | ICD-10-CM

## 2015-10-09 DIAGNOSIS — Z794 Long term (current) use of insulin: Secondary | ICD-10-CM

## 2015-10-09 DIAGNOSIS — R0789 Other chest pain: Secondary | ICD-10-CM

## 2015-10-09 DIAGNOSIS — I10 Essential (primary) hypertension: Secondary | ICD-10-CM

## 2015-10-09 DIAGNOSIS — IMO0002 Reserved for concepts with insufficient information to code with codable children: Secondary | ICD-10-CM

## 2015-10-09 LAB — COMPREHENSIVE METABOLIC PANEL
ALT: 24 U/L (ref 0–35)
AST: 16 U/L (ref 0–37)
Albumin: 3.8 g/dL (ref 3.5–5.2)
Alkaline Phosphatase: 113 U/L (ref 39–117)
BUN: 17 mg/dL (ref 6–23)
CO2: 28 mEq/L (ref 19–32)
Calcium: 9.5 mg/dL (ref 8.4–10.5)
Chloride: 96 mEq/L (ref 96–112)
Creatinine, Ser: 0.81 mg/dL (ref 0.40–1.20)
GFR: 74.6 mL/min (ref >60.00)
Glucose, Bld: 244 mg/dL — ABNORMAL HIGH (ref 70–99)
Potassium: 4.5 mEq/L (ref 3.5–5.1)
Sodium: 133 mEq/L — ABNORMAL LOW (ref 135–145)
Total Bilirubin: 0.3 mg/dL (ref 0.2–1.2)
Total Protein: 7.6 g/dL (ref 6.0–8.3)

## 2015-10-09 LAB — TROPONIN I: TNIDX: 0 ug/l (ref 0.00–0.06)

## 2015-10-09 LAB — HEMOGLOBIN A1C: Hgb A1c MFr Bld: 11.3 % — ABNORMAL HIGH (ref 4.6–6.5)

## 2015-10-09 NOTE — Progress Notes (Signed)
Pre visit review using our clinic review tool, if applicable. No additional management support is needed unless otherwise documented below in the visit note. 

## 2015-10-09 NOTE — Patient Instructions (Signed)
We will check the blood work today and call you back with the results. The EKG today did not show any heart attack or old heart attack but I still want you to see a heart doctor to get the stress test we talked about. I still worry with the diabetes that there could be problems with the heart that we are not seeing.   For the dizziness you can try meclizine over the counter for dizziness. It can be caused by dehydration or inner ear problems. I have given you a sheet with exercises to try to help with the symptoms. Initially the exercises may give you some dizziness but they should help once it passes (should be less than 5 minutes).

## 2015-10-10 ENCOUNTER — Encounter: Payer: Self-pay | Admitting: Internal Medicine

## 2015-10-10 DIAGNOSIS — R0789 Other chest pain: Secondary | ICD-10-CM | POA: Insufficient documentation

## 2015-10-10 NOTE — Progress Notes (Signed)
   Subjective:    Patient ID: [name], female    DOB: May 24, 1946, 69 y.o.   MRN: [id number]  HPI The patient is a 69 YO female coming in for follow up of her diabetes. However she is having a new problem which is more important. She is having chest discomfort off and on. She notices that it happens when she is trying to do things. She is under a lot of stress lately but this has been more often than usual. Denies numbness or jaw pain during these episodes. Denies sweating during these episodes. Is taking aspirin 81 mg daily when she remembers. Her sugars have been running high lately. The discomfort lasts about 5-10 minutes. Relieved with rest. Has never tried taking anything for it. Denies palpitations.  Review of Systems  Constitutional: Positive for activity change. Negative for fever, chills, appetite change, fatigue and unexpected weight change.  HENT: Positive for dental problem.   Respiratory: Negative for cough, chest tightness, shortness of breath and wheezing.   Cardiovascular: Positive for chest pain. Negative for palpitations and leg swelling.  Gastrointestinal: Negative for abdominal pain, diarrhea, constipation, blood in stool and abdominal distention.  Musculoskeletal: Positive for arthralgias. Negative for myalgias and gait problem.  Skin: Negative.   Neurological: Positive for numbness. Negative for dizziness, syncope, weakness, light-headedness and headaches.  Psychiatric/Behavioral: Positive for dysphoric mood and decreased concentration. The patient is nervous/anxious.       Objective:   Physical Exam  Constitutional: She is oriented to person, place, and time. She appears well-developed and well-nourished.  Overweight  HENT:  Head: Normocephalic and atraumatic.  Multiple broken teeth  Eyes: EOM are normal.  Neck: Normal range of motion.  Cardiovascular: Normal rate and regular rhythm.   Pulmonary/Chest: Effort normal and breath sounds normal. No  respiratory distress. She has no wheezes. She has no rales. She exhibits no tenderness.  Abdominal: Soft. Bowel sounds are normal. She exhibits no distension. There is no tenderness. There is no rebound.  Musculoskeletal: She exhibits no edema.  Neurological: She is alert and oriented to person, place, and time. Coordination normal.  Skin: Skin is warm and dry.   Filed Vitals:   10/09/15 0956  BP: 124/90  Pulse: 73  Temp: 98.1 F (36.7 C)  TempSrc: Oral  Resp: 12  Height: 5\' 4"  (1.626 m)  Weight: 212 lb 6.4 oz (96.344 kg)  SpO2: 94%   EKG: Rate 78, left axis, no ST or t wave changes, intervals okay, no significant change from 12/2014    Assessment & Plan:  Flu shot given at visit.

## 2015-10-10 NOTE — Assessment & Plan Note (Signed)
She comes back and has not been checking her sugars lately. Due to her acute chest pain we did not have much time to discuss this today. Suspect her control will still be poor. May recommend her to return to endocrinology as she is not able to ever bring in sugar readings so that we can adjust her sugars. Complicated by neuropathy which is worsening over the last year. Checking HgA1c today.

## 2015-10-10 NOTE — Assessment & Plan Note (Signed)
BP not quite at goal today but previously has been at goal. Takes lasix and lisinopril daily. No adjustment today.

## 2015-10-10 NOTE — Assessment & Plan Note (Signed)
EKG without new changes, still concerning for CAD as she is high risk with her blood pressure, CAD, LDL not at goal (last 139). Will refer to cardiology and likely needs stress test, she is not sure if she can exercise.

## 2015-10-16 ENCOUNTER — Other Ambulatory Visit: Payer: Self-pay | Admitting: Internal Medicine

## 2015-10-16 DIAGNOSIS — Z794 Long term (current) use of insulin: Principal | ICD-10-CM

## 2015-10-16 DIAGNOSIS — E118 Type 2 diabetes mellitus with unspecified complications: Principal | ICD-10-CM

## 2015-10-16 DIAGNOSIS — E1165 Type 2 diabetes mellitus with hyperglycemia: Secondary | ICD-10-CM

## 2015-10-16 DIAGNOSIS — IMO0002 Reserved for concepts with insufficient information to code with codable children: Secondary | ICD-10-CM

## 2015-10-27 ENCOUNTER — Encounter: Payer: Self-pay | Admitting: Cardiovascular Disease

## 2015-10-27 ENCOUNTER — Ambulatory Visit (INDEPENDENT_AMBULATORY_CARE_PROVIDER_SITE_OTHER): Payer: Medicare Other | Admitting: Cardiovascular Disease

## 2015-10-27 VITALS — BP 134/78 | HR 84 | Ht 64.0 in | Wt 210.4 lb

## 2015-10-27 DIAGNOSIS — I10 Essential (primary) hypertension: Secondary | ICD-10-CM

## 2015-10-27 DIAGNOSIS — E785 Hyperlipidemia, unspecified: Secondary | ICD-10-CM

## 2015-10-27 DIAGNOSIS — R079 Chest pain, unspecified: Secondary | ICD-10-CM | POA: Diagnosis not present

## 2015-10-27 DIAGNOSIS — E669 Obesity, unspecified: Secondary | ICD-10-CM | POA: Diagnosis not present

## 2015-10-27 MED ORDER — EZETIMIBE 10 MG PO TABS: 10.0000 mg | ORAL_TABLET | Freq: Every day | ORAL | Status: DC

## 2015-10-27 NOTE — Progress Notes (Signed)
Cardiology Office Note   Date:  10/27/2015   ID:  [name], DOB 1946/02/03, MRN [id number]  PCP:  Myrlene Broker, MD  Cardiologist:   Madilyn Hook, MD   Chief Complaint  Patient presents with  . New Evaluation    pt states no chest pain but have back pain theaet goes up in her head, no SOB, some light headedness and dizzy,   . Edema    states her toes burn and feet and ankles have edema     History of Present Illness: [name] is a 69 y.o. female with hypertension, hyperlipidemia, DM, GERD and venous insufficiency who presents for an evaluation of chest pain.  [name] saw Dr. Hillard Danker on 11/1 with a complaint of exertional chest pain.  Her EKG showed sinus rhythm with LAFB and she was referred to cardiology for further evaluation.  [name] has all report episodes of pain in her back that radiate up to the back of her head.  It takes her breath away.  She is unable to move when this happens.  It can occur at any time of the day but more commonly it occurs in the evening.  Often  it occurs when she is in bed.  It lasts up to 3-4 minutes at a time.  There is no associated nausea, vomiting, or diaphoresis.  This is particularly occur with exertion. She denies any chest pain, orthopnea or PND. She has had lower extremity edema in the past but not recently.   [name] does not get much formal exercise. She walks occasionally and cares for her significant other who is quite ill. She reports that he is barely eating anymore and anticipate he will die soon.  She reports that her diet is "lousy." She likes a "country supper" but has been trying to eat more salads recently. She eats a lot of fish patties from [employer]. She has tried to cut back on the number of white foods that she such as pasta, bread and potatoes. She drinks mostly decaffeinated coffee and juice. She does like to eat a lot of green  vegetables.  She was previously on a statin that she did not tolerate.  She tried three different statins and did not tolerate any of them.    Past Medical History  Diagnosis Date  . GERD (gastroesophageal reflux disease)   . Hyperlipidemia   . Hypertension   . Diabetes mellitus     type II  . Depression   . Peripheral neuropathy (HCC)   . Venous insufficiency   . Achilles tendinitis   . Right shoulder pain     Subacromial tendinitis  . Calcaneal spur     right  . Ventral hernia   . Cataract   . Diverticulosis of colon (without mention of hemorrhage) 2013  . Internal hemorrhoids 2013  . GI bleed 03/26/14  . Acute renal failure (ARF) (HCC) 12/19/2014    Past Surgical History  Procedure Laterality Date  . Cholecystectomy  2001  . Abdominal hysterectomy  2001    TAH-BSO  . Incisional hernia repair    . Tonsillectomy    . Esophagogastroduodenoscopy  2013    normal   . Colonoscopy  2013    diverticulosis      Current Outpatient Prescriptions  Medication Sig Dispense Refill  . aspirin 81 MG tablet Take 81 mg by mouth daily.    . citalopram (CELEXA) 20  MG tablet Take 1 tablet (20 mg total) by mouth daily. 30 tablet 6  . dicyclomine (BENTYL) 20 MG tablet TAKE 1 TABLET (20 MG TOTAL) BY MOUTH 3 (THREE) TIMES DAILY BEFORE MEALS. 90 tablet 3  . esomeprazole (NEXIUM) 20 MG capsule Take 1 capsule (20 mg total) by mouth daily at 12 noon. 90 capsule 3  . Ferrous Sulfate (IRON) 325 (65 FE) MG TABS Take 1 tablet by mouth 2 (two) times daily. (Patient taking differently: Take 1 tablet by mouth daily. ) 30 each 0  . furosemide (LASIX) 20 MG tablet Take 1 tablet (20 mg total) by mouth daily as needed for fluid or edema. 90 tablet 3  . gabapentin (NEURONTIN) 300 MG capsule Take 1 capsule (300 mg total) by mouth 3 (three) times daily. 270 capsule 3  . insulin lispro (HUMALOG KWIKPEN) 100 UNIT/ML KiwkPen 15 units with meals 15 mL 11  . Insulin NPH, Human,, Isophane, (HUMULIN N KWIKPEN) 100  UNIT/ML Kiwkpen Inject 75 Units into the skin at bedtime. 24 mL 11  . Insulin Pen Needle 31G X 8 MM MISC Use to inject insulin four times daily E11.41 100 each 3  . lisinopril (PRINIVIL,ZESTRIL) 20 MG tablet Take 1 tablet (20 mg total) by mouth daily. 90 tablet 3  . metFORMIN (GLUCOPHAGE) 1000 MG tablet Patient takes 0.5 tablet in the morning, 0.5 tablet at lunch, and one whole tablet at dinner. 180 tablet 3  . sodium chloride (OCEAN) 0.65 % SOLN nasal spray Place 1 spray into both nostrils 4 (four) times daily. (Patient taking differently: Place 1 spray into both nostrils as needed for congestion. ) 30 mL 2  . Trolamine Salicylate (ASPERCREME EX) Apply 1 application topically daily as needed (shoulder/hips/knee/knuckles).     No current facility-administered medications for this visit.    Allergies:   Morphine; Codeine sulfate; Demerol; Meperidine hcl; and Propoxyphene hcl    Social History:  The patient  reports that she has never smoked. She has never used smokeless tobacco. She reports that she does not drink alcohol or use illicit drugs.   Family History:  The patient's family history includes CVA in her father; Diabetes in her paternal grandmother and sister; Heart failure in her father and mother; Other in her brother and sister; Ovarian cancer in her sister. There is no history of Colon cancer.    ROS:  Please see the history of present illness.   Otherwise, review of systems are positive for none.   All other systems are reviewed and negative.    PHYSICAL EXAM: VS:  BP 134/78 mmHg  Pulse 84  Ht 5\' 4"  (1.626 m)  Wt 95.437 kg (210 lb 6.4 oz)  BMI 36.10 kg/m2 , BMI Body mass index is 36.1 kg/(m^2). GENERAL:  Well appearing HEENT:  Pupils equal round and reactive, fundi not visualized, oral mucosa unremarkable NECK:  No jugular venous distention, waveform within normal limits, carotid upstroke brisk and symmetric, no bruits, no thyromegaly LYMPHATICS:  No cervical  adenopathy LUNGS:  Clear to auscultation bilaterally HEART:  RRR.  PMI not displaced or sustained,S1 and S2 within normal limits, no S3, no S4, no clicks, no rubs, II/VI systolic murmurs ABD:  Flat, positive bowel sounds normal in frequency in pitch, no bruits, no rebound, no guarding, no midline pulsatile mass, no hepatomegaly, no splenomegaly EXT:  2 plus pulses throughout, no edema, no cyanosis no clubbing SKIN:  No rashes no nodules NEURO:  Cranial nerves II through XII grossly intact, motor grossly intact  throughout [facility]:  Cognitively intact, oriented to person place and time    EKG:  EKG is ordered today. The ekg ordered today demonstrates sinus rhythm rate 84 bpm.  LAFB.   Recent Labs: 08/08/2015: Hemoglobin 13.9; Platelets 292.0; TSH 2.15 10/09/2015: ALT 24; BUN 17; Creatinine, Ser 0.81; Potassium 4.5; Sodium 133*    Lipid Panel    Component Value Date/Time   CHOL 227* 04/07/2015 1121   TRIG 222.0* 04/07/2015 1121   HDL 36.20* 04/07/2015 1121   CHOLHDL 6 04/07/2015 1121   VLDL 44.4* 04/07/2015 1121   LDLCALC 139* 10/22/2013 1019   LDLDIRECT 154.0 04/07/2015 1121      Wt Readings from Last 3 Encounters:  10/27/15 95.437 kg (210 lb 6.4 oz)  10/09/15 96.344 kg (212 lb 6.4 oz)  08/08/15 97.523 kg (215 lb)      ASSESSMENT AND PLAN:  # Back pain: [name]'s back pain is quite atypical and unlikely to be due to ischemia. However she does have several risk factors including obesity, sedentary lifestyle, diabetes, hypertension, and hyperlipidemia. Therefore we will refer her for Lexiscan Cardiolite in order to evaluate for ischemia.  # Hypertension:  Blood pressure well-controlled.  Continue lisinopril.  # Hyperlipidemia:  She has not tolerated several different statins in the past. We will start Zetia 10 mg daily, as her ASCVD 10 year risk is 23.9%.     # Obesity: We recommended that [name] increase her exercise to 30-40 minutes most days of the week.We  discussed the fact that this does not happen all the time and once.    Current medicines are reviewed at length with the patient today.  The patient does not have concerns regarding medicines.  The following changes have been made:  Start Zetia  Labs/ tests ordered today include:  No orders of the defined types were placed in this encounter.     Disposition:   FU with Avaleigh Decuir C. Duke Salvia, MD in 6 months.   Signed, Madilyn Hook, MD  10/27/2015 12:03 PM    [facility]

## 2015-10-27 NOTE — Patient Instructions (Signed)
Medication Instructions:  START Zetia 10 mg - take 1 tablet by mouth daily. A new prescription has been sent to your pharmacy electronically.  Labwork: NONE  Testing/Procedures: Your physician has requested that you have a lexiscan myoview. For further information please visit [email or web address] Please follow instruction sheet, as given.   Follow-Up: Dr Duke Salviaandolph recommends that you schedule a follow-up appointment in 6 months. You will receive a reminder letter in the mail two months in advance. If you don't receive a letter, please call our office to schedule the follow-up appointment.  If you need a refill on your cardiac medications before your next appointment, please call your pharmacy.

## 2015-11-08 ENCOUNTER — Ambulatory Visit: Payer: Medicare Other | Admitting: Internal Medicine

## 2015-11-08 ENCOUNTER — Encounter: Payer: Self-pay | Admitting: Internal Medicine

## 2015-11-08 ENCOUNTER — Ambulatory Visit (INDEPENDENT_AMBULATORY_CARE_PROVIDER_SITE_OTHER): Payer: Medicare Other | Admitting: Internal Medicine

## 2015-11-08 VITALS — BP 120/76 | HR 98 | Temp 98.4°F | Resp 12 | Ht 64.0 in | Wt 213.0 lb

## 2015-11-08 DIAGNOSIS — IMO0002 Reserved for concepts with insufficient information to code with codable children: Secondary | ICD-10-CM

## 2015-11-08 DIAGNOSIS — E1140 Type 2 diabetes mellitus with diabetic neuropathy, unspecified: Secondary | ICD-10-CM

## 2015-11-08 DIAGNOSIS — E1165 Type 2 diabetes mellitus with hyperglycemia: Secondary | ICD-10-CM | POA: Diagnosis not present

## 2015-11-08 NOTE — Progress Notes (Signed)
Patient ID: [name], female   DOB: [birth year], 69 y.o.   MRN: [id number]  HPI: [name] is a 69 y.o.-year-old female, referred by her PCP, Dr. Okey Duprerawford, for management of DM2, dx in 1999, insulin-dependent ~2006, uncontrolled, with complications (DR, PN).  Last hemoglobin A1c was: Lab Results  Component Value Date   HGBA1C 11.3* 10/09/2015   HGBA1C 10.0* 07/10/2015   HGBA1C 8.8* 04/07/2015   Pt is on a regimen of: - Metformin 500-500-500/1000 mg (depending on the size of the meal) - NPH 75 units at bedtime (pen) - Humalog 15 units 2-3x a day, before meals (occasionally forgets when she is out)  Pt checks her sugars 0-1 (!) a day and they are: - am: 160-200 - 2h after b'fast: n/c - before lunch: n/c  - 2h after lunch: n/c - before dinner: n/c - 2h after dinner: n/c - bedtime: n/c - nighttime: n/c No lows. Lowest sugar was 160; ? hypoglycemia awareness Highest sugar was 320.  Glucometer: ReliOn  Pt's meals are: - Breakfast: toast + melted cheddar cheese; decaf coffee - Lunch: leftovers - Dinner: occasional meat, beans + veggies - but not a lot of cooking lately - Snacks: grapes + cheese + juice - seldom.  - no CKD, last BUN/creatinine:  Lab Results  Component Value Date   BUN 17 10/09/2015   CREATININE 0.81 10/09/2015  On Lisinopril. - last set of lipids: Lab Results  Component Value Date   CHOL 227* 04/07/2015   HDL 36.20* 04/07/2015   LDLCALC 139* 10/22/2013   LDLDIRECT 154.0 04/07/2015   TRIG 222.0* 04/07/2015   CHOLHDL 6 04/07/2015  On Zetia. - last eye exam was on 07/12/2015. + DR.  - + numbness and tingling in her feet.  Pt has FH of DM in father, sister, PGM.  ROS: Constitutional: no weight gain/loss, no fatigue, no subjective hyperthermia/hypothermia Eyes: no blurry vision, no xerophthalmia ENT: no sore throat, no nodules palpated in throat, no dysphagia/odynophagia, no hoarseness Cardiovascular: no CP/SOB/palpitations/leg  swelling Respiratory: no cough/SOB Gastrointestinal: no N/V/D/C Musculoskeletal: no muscle/joint aches Skin: no rashes Neurological: no tremors/numbness/tingling/dizziness Psychiatric: no depression/anxiety  Past Medical History  Diagnosis Date  . GERD (gastroesophageal reflux disease)   . Hyperlipidemia   . Hypertension   . Diabetes mellitus     type II  . Depression   . Peripheral neuropathy (HCC)   . Venous insufficiency   . Achilles tendinitis   . Right shoulder pain     Subacromial tendinitis  . Calcaneal spur     right  . Ventral hernia   . Cataract   . Diverticulosis of colon (without mention of hemorrhage) 2013  . Internal hemorrhoids 2013  . GI bleed 03/26/14  . Acute renal failure (ARF) (HCC) 12/19/2014   Past Surgical History  Procedure Laterality Date  . Cholecystectomy  2001  . Abdominal hysterectomy  2001    TAH-BSO  . Incisional hernia repair    . Tonsillectomy    . Esophagogastroduodenoscopy  2013    normal   . Colonoscopy  2013    diverticulosis    Social History   Social History  . Marital Status: Divorced    Spouse Name: N/A  . Number of Children: 2  . Years of Education: N/A   Occupational History  . 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 Smoking status: Never Smoker   . Smokeless tobacco: Never Used  . Alcohol Use: No  .  Drug Use: No  . Sexual Activity: Not on file   Other Topics Concern  . 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 Patient needs to submit further paperwork to complete   Rudell Cobb  March 06, 2010 2:35 PM   Financial assistance approved for 100% discount at [facility] and has [facility] card   Rudell Cobb  March 12, 2010 3:55 PM   Current Outpatient Prescriptions on File Prior  to Visit  Medication Sig Dispense Refill  . aspirin 81 MG tablet Take 81 mg by mouth daily.    . citalopram (CELEXA) 20 MG tablet Take 1 tablet (20 mg total) by mouth daily. 30 tablet 6  . dicyclomine (BENTYL) 20 MG tablet TAKE 1 TABLET (20 MG TOTAL) BY MOUTH 3 (THREE) TIMES DAILY BEFORE MEALS. 90 tablet 3  . esomeprazole (NEXIUM) 20 MG capsule Take 1 capsule (20 mg total) by mouth daily at 12 noon. 90 capsule 3  . ezetimibe (ZETIA) 10 MG tablet Take 1 tablet (10 mg total) by mouth daily. 30 tablet 11  . Ferrous Sulfate (IRON) 325 (65 FE) MG TABS Take 1 tablet by mouth 2 (two) times daily. (Patient taking differently: Take 1 tablet by mouth daily. ) 30 each 0  . furosemide (LASIX) 20 MG tablet Take 1 tablet (20 mg total) by mouth daily as needed for fluid or edema. 90 tablet 3  . gabapentin (NEURONTIN) 300 MG capsule Take 1 capsule (300 mg total) by mouth 3 (three) times daily. 270 capsule 3  . insulin lispro (HUMALOG KWIKPEN) 100 UNIT/ML KiwkPen 15 units with meals 15 mL 11  . Insulin NPH, Human,, Isophane, (HUMULIN N KWIKPEN) 100 UNIT/ML Kiwkpen Inject 75 Units into the skin at bedtime. 24 mL 11  . Insulin Pen Needle 31G X 8 MM MISC Use to inject insulin four times daily E11.41 100 each 3  . lisinopril (PRINIVIL,ZESTRIL) 20 MG tablet Take 1 tablet (20 mg total) by mouth daily. 90 tablet 3  . metFORMIN (GLUCOPHAGE) 1000 MG tablet Patient takes 0.5 tablet in the morning, 0.5 tablet at lunch, and one whole tablet at dinner. 180 tablet 3  . sodium chloride (OCEAN) 0.65 % SOLN nasal spray Place 1 spray into both nostrils 4 (four) times daily. (Patient taking differently: Place 1 spray into both nostrils as needed for congestion. ) 30 mL 2  . Trolamine Salicylate (ASPERCREME EX) Apply 1 application topically daily as needed (shoulder/hips/knee/knuckles).     No current facility-administered medications on file prior to visit.   Allergies  Allergen Reactions  . Morphine Other (See Comments)     'took me out of this world' I had to be resuscitated  . Codeine Sulfate Other (See Comments)    GI upset and pain  . Demerol [Meperidine] Nausea And Vomiting  . Meperidine Hcl Nausea And Vomiting and Swelling  . Propoxyphene Hcl Other (See Comments)    Darvocet caused sick headache   Family History  Problem Relation Age of Onset  . Heart failure Father     Died in 76s  . CVA Father   . Colon cancer Neg Hx   . Ovarian cancer Sister   . Diabetes Sister   .  Other Sister     died at birth  . Diabetes Paternal Grandmother   . Heart failure Mother     Died in 55s  . Other Brother     drowned   PE: BP 120/76 mmHg  Pulse 98  Temp(Src) 98.4 F (36.9 C) (Oral)  Resp 12  Ht  (1.626 m)  Wt 213 lb (96.616 kg)  BMI 36.54 kg/m2  SpO2 94% Wt Readings from Last 3 Encounters:  11/08/15 213 lb (96.616 kg)  10/27/15 210 lb 6.4 oz (95.437 kg)  10/09/15 212 lb 6.4 oz (96.344 kg)   Constitutional: Obese, in NAD Eyes: PERRLA, EOMI, no exophthalmos ENT: moist mucous membranes, no thyromegaly, no cervical lymphadenopathy Cardiovascular: RRR, No MRG Respiratory: CTA B Gastrointestinal: abdomen soft, NT, ND, BS+ Musculoskeletal: no deformities, strength intact in all 4 Skin: moist, warm, no rashes Neurological: no tremor with outstretched hands, DTR normal in all 4  ASSESSMENT: 1. DM2, insulin-dependent, uncontrolled, with complications - Diabetic retinopathy - Peripheral neuropathy  PLAN:  1. Patient with long-standing, uncontrolled diabetes, on Metformin + basal-bolus insulin regimen, which is insufficient. She is on an unusual regimen, with NPH taken once a day, rather than twice a day. She also adjusts the dose of metformin with dinner depending on the size of the meal. She does not take her Humalog if she is out running errands that she keeps her insulin pen in the fridge. - I advised her to split the NPH insulin in 2 doses, higher in the morning and lower at bedtime. - I also  advised her to not change metformin based on the size of the meal, and continue with 1000 mg with dinner and 500 mg with the other 2 meals of the day - I advised her not to give her insulin in the fridge, after starting to use the pen. She needs to take it with her and injected before every meal. I do want to give her a little be more flexibility of insulin dosing with her meals, therefore, I will give her 2 doses to take with a meal, depending on the size of the meal.  - I suggested to:  Patient Instructions  Please increase the Metformin to [phone] mg with b'fast-lunch-dinner. Split Humulin N to 40 units in am, before b'fast and 35 units before bedtime. Change Humalog before meals: - 15 units for a smaller meal - 20 units before a larger meal or if you have dessert  Please take the Humalog insulin 15 min before the meal.  Please return in 1-1.5 months with your sugar log.   - Strongly advised her to start checking sugars at different times of the day - check 3 times a day, rotating checks - given sugar log and advised how to fill it and to bring it at next appt  - given foot care handout and explained the principles  - given instructions for hypoglycemia management "15-15 rule"  - advised for yearly eye exams >> She is up-to-date - Return to clinic in 1-1.5 mo with sugar log

## 2015-11-08 NOTE — Patient Instructions (Signed)
Please increase the Metformin to [phone] mg with b'fast-lunch-dinner. Split Humulin N to 40 units in am, before b'fast and 35 units before bedtime. Change Humalog before meals: - 15 units for a smaller meal - 20 units before a larger meal or if you have dessert  Please take the Humalog insulin 15 min before the meal.  Please return in 1-1.5 months with your sugar log.   PATIENT INSTRUCTIONS FOR TYPE 2 DIABETES:  **Please join MyChart!** - see attached instructions about how to join if you have not done so already.  DIET AND EXERCISE Diet and exercise is an important part of diabetic treatment.  We recommended aerobic exercise in the form of brisk walking (working between 40-60% of maximal aerobic capacity, similar to brisk walking) for 150 minutes per week (such as 30 minutes five days per week) along with 3 times per week performing 'resistance' training (using various gauge rubber tubes with handles) 5-10 exercises involving the major muscle groups (upper body, lower body and core) performing 10-15 repetitions (or near fatigue) each exercise. Start at half the above goal but build slowly to reach the above goals. If limited by weight, joint pain, or disability, we recommend daily walking in a swimming pool with water up to waist to reduce pressure from joints while allow for adequate exercise.    BLOOD GLUCOSES Monitoring your blood glucoses is important for continued management of your diabetes. Please check your blood glucoses 2-4 times a day: fasting, before meals and at bedtime (you can rotate these measurements - e.g. one day check before the 3 meals, the next day check before 2 of the meals and before bedtime, etc.).   HYPOGLYCEMIA (low blood sugar) Hypoglycemia is usually a reaction to not eating, exercising, or taking too much insulin/ other diabetes drugs.  Symptoms include tremors, sweating, hunger, confusion, headache, etc. Treat IMMEDIATELY with 15 grams of Carbs: . 4  glucose tablets .  cup regular juice/soda . 2 tablespoons raisins . 4 teaspoons sugar . 1 tablespoon honey Recheck blood glucose in 15 mins and repeat above if still symptomatic/blood glucose <100.  RECOMMENDATIONS TO REDUCE YOUR RISK OF DIABETIC COMPLICATIONS: * Take your prescribed MEDICATION(S) * Follow a DIABETIC diet: Complex carbs, fiber rich foods, (monounsaturated and polyunsaturated) fats * AVOID saturated/trans fats, high fat foods, >2,300 mg salt per day. * EXERCISE at least 5 times a week for 30 minutes or preferably daily.  * DO NOT SMOKE OR DRINK more than 1 drink a day. * Check your FEET every day. Do not wear tightfitting shoes. Contact us if you develop an ulcer * See your EYE doctor once a year or more if needed * Get a FLU shot once a year * Get a PNEUMONIA vaccine once before and once after age 20 years  GOALS:  * Your Hemoglobin A1c of <7%  * fasting sugars need to be <130 * after meals sugars need to be <180 (2h after you start eating) * Your Systolic BP should be 140 or lower  * Your Diastolic BP should be 80 or lower  * Your HDL (Good Cholesterol) should be 40 or higher  * Your LDL (Bad Cholesterol) should be 100 or lower. * Your Triglycerides should be 150 or lower  * Your Urine microalbumin (kidney function) should be <30 * Your Body Mass Index should be 25 or lower    Please consider the following ways to cut down carbs and fat and increase fiber and micronutrients in your diet: -  substitute whole grain for white bread or pasta - substitute brown rice for white rice - substitute 90-calorie flat bread pieces for slices of bread when possible - substitute sweet potatoes or yams for white potatoes - substitute humus for margarine - substitute tofu for cheese when possible - substitute almond or rice milk for regular milk (would not drink soy milk daily due to concern for soy estrogen influence on breast cancer risk) - substitute dark chocolate for other  sweets when possible - substitute water - can add lemon or orange slices for taste - for diet sodas (artificial sweeteners will trick your body that you can eat sweets without getting calories and will lead you to overeating and weight gain in the long run) - do not skip breakfast or other meals (this will slow down the metabolism and will result in more weight gain over time)  - can try smoothies made from fruit and almond/rice milk in am instead of regular breakfast - can also try old-fashioned (not instant) oatmeal made with almond/rice milk in am - order the dressing on the side when eating salad at a restaurant (pour less than half of the dressing on the salad) - eat as little meat as possible - can try juicing, but should not forget that juicing will get rid of the fiber, so would alternate with eating raw veg./fruits or drinking smoothies - use as little oil as possible, even when using olive oil - can dress a salad with a mix of balsamic vinegar and lemon juice, for e.g. - use agave nectar, stevia sugar, or regular sugar rather than artificial sweateners - steam or broil/roast veggies  - snack on veggies/fruit/nuts (unsalted, preferably) when possible, rather than processed foods - reduce or eliminate aspartame in diet (it is in diet sodas, chewing gum, etc) Read the labels!  Try to read Dr. Katherina RightNeal Barnard's book: "Program for Reversing Diabetes" for other ideas for healthy eating.

## 2015-11-09 ENCOUNTER — Telehealth (HOSPITAL_COMMUNITY): Payer: Self-pay

## 2015-11-09 NOTE — Telephone Encounter (Signed)
Encounter complete. 

## 2015-11-14 ENCOUNTER — Ambulatory Visit (HOSPITAL_COMMUNITY)
Admission: RE | Admit: 2015-11-14 | Discharge: 2015-11-14 | Disposition: A | Discharge status: Home / Self Care | Payer: Medicare Other | Source: Ambulatory Visit | Attending: Cardiology | Admitting: Cardiology

## 2015-11-14 DIAGNOSIS — E669 Obesity, unspecified: Secondary | ICD-10-CM | POA: Diagnosis not present

## 2015-11-14 DIAGNOSIS — R0609 Other forms of dyspnea: Secondary | ICD-10-CM | POA: Insufficient documentation

## 2015-11-14 DIAGNOSIS — R5383 Other fatigue: Secondary | ICD-10-CM | POA: Diagnosis not present

## 2015-11-14 DIAGNOSIS — I10 Essential (primary) hypertension: Secondary | ICD-10-CM | POA: Diagnosis not present

## 2015-11-14 DIAGNOSIS — R55 Syncope and collapse: Secondary | ICD-10-CM | POA: Diagnosis not present

## 2015-11-14 DIAGNOSIS — E119 Type 2 diabetes mellitus without complications: Secondary | ICD-10-CM | POA: Diagnosis not present

## 2015-11-14 DIAGNOSIS — Z6836 Body mass index (BMI) 36.0-36.9, adult: Secondary | ICD-10-CM | POA: Insufficient documentation

## 2015-11-14 DIAGNOSIS — Z8249 Family history of ischemic heart disease and other diseases of the circulatory system: Secondary | ICD-10-CM | POA: Insufficient documentation

## 2015-11-14 DIAGNOSIS — R42 Dizziness and giddiness: Secondary | ICD-10-CM | POA: Diagnosis not present

## 2015-11-14 DIAGNOSIS — R079 Chest pain, unspecified: Secondary | ICD-10-CM | POA: Insufficient documentation

## 2015-11-14 LAB — MYOCARDIAL PERFUSION IMAGING
LV dias vol: 84 mL
LV sys vol: 34 mL
Peak HR: 90 {beats}/min
Rest HR: 68 {beats}/min
SDS: 0
SRS: 1
SSS: 1
TID: 1.16

## 2015-11-14 MED ORDER — TECHNETIUM TC 99M SESTAMIBI GENERIC - CARDIOLITE
30.9000 | Freq: Once | INTRAVENOUS | Status: AC | PRN
  Administered 2015-11-14: 30.9 via INTRAVENOUS

## 2015-11-14 MED ORDER — AMINOPHYLLINE 25 MG/ML IV SOLN
75.0000 mg | Freq: Once | INTRAVENOUS | Status: AC
  Administered 2015-11-14: 75 mg via INTRAVENOUS

## 2015-11-14 MED ORDER — TECHNETIUM TC 99M SESTAMIBI GENERIC - CARDIOLITE
10.1000 | Freq: Once | INTRAVENOUS | Status: AC | PRN
  Administered 2015-11-14: 10.1 via INTRAVENOUS

## 2015-11-14 MED ORDER — REGADENOSON 0.4 MG/5ML IV SOLN
0.4000 mg | Freq: Once | INTRAVENOUS | Status: AC
  Administered 2015-11-14: 0.4 mg via INTRAVENOUS

## 2015-11-16 ENCOUNTER — Telehealth: Payer: Self-pay | Admitting: *Deleted

## 2015-11-16 NOTE — Telephone Encounter (Signed)
Left message to cal back- in regards test results

## 2015-11-16 NOTE — Telephone Encounter (Signed)
-----   Message from Chilton Siiffany [place], MD sent at 11/15/2015  4:03 PM EST ----- Normal stress test.

## 2015-11-16 NOTE — Telephone Encounter (Signed)
Spoke to patient. Result given . Verbalized understanding  

## 2015-12-04 ENCOUNTER — Other Ambulatory Visit: Payer: Self-pay | Admitting: Internal Medicine

## 2015-12-12 ENCOUNTER — Ambulatory Visit: Payer: Medicare Other | Admitting: Internal Medicine

## 2016-01-04 ENCOUNTER — Ambulatory Visit: Payer: Medicare Other | Admitting: Internal Medicine

## 2016-01-29 ENCOUNTER — Other Ambulatory Visit: Payer: Self-pay | Admitting: Internal Medicine

## 2016-01-29 ENCOUNTER — Telehealth: Payer: Self-pay | Admitting: *Deleted

## 2016-01-29 NOTE — Telephone Encounter (Signed)
Received call from case manager w/[employer]. Wanting to get pt last BP reading. Inform nurse pt last saw md 10/09/15 gave BP.Marland KitchenRaechel Chute

## 2016-02-13 ENCOUNTER — Telehealth: Payer: Self-pay | Admitting: Internal Medicine

## 2016-02-13 NOTE — Telephone Encounter (Signed)
Noted, please remind patient she is overdue for visit.

## 2016-02-13 NOTE — Telephone Encounter (Signed)
Maxine GlennMonica is a np with [employer] [phone]  This is just fyi .  She was seeing pt at home today  A1C-10.9 And her urine, showed 3 plus glucose

## 2016-04-23 ENCOUNTER — Other Ambulatory Visit: Payer: Self-pay

## 2016-04-23 MED ORDER — LISINOPRIL 20 MG PO TABS: 20.0000 mg | ORAL_TABLET | Freq: Every day | ORAL | Status: DC

## 2016-07-03 ENCOUNTER — Other Ambulatory Visit: Payer: Self-pay | Admitting: Internal Medicine

## 2016-07-03 DIAGNOSIS — E1149 Type 2 diabetes mellitus with other diabetic neurological complication: Secondary | ICD-10-CM

## 2016-07-03 DIAGNOSIS — IMO0002 Reserved for concepts with insufficient information to code with codable children: Secondary | ICD-10-CM

## 2016-07-03 DIAGNOSIS — E1165 Type 2 diabetes mellitus with hyperglycemia: Secondary | ICD-10-CM

## 2016-07-23 ENCOUNTER — Other Ambulatory Visit (INDEPENDENT_AMBULATORY_CARE_PROVIDER_SITE_OTHER): Payer: Medicare Other

## 2016-07-23 ENCOUNTER — Ambulatory Visit (INDEPENDENT_AMBULATORY_CARE_PROVIDER_SITE_OTHER): Payer: Medicare Other | Admitting: Internal Medicine

## 2016-07-23 ENCOUNTER — Encounter: Payer: Self-pay | Admitting: Internal Medicine

## 2016-07-23 VITALS — BP 138/70 | HR 85 | Temp 98.7°F | Resp 12 | Ht 64.0 in | Wt 209.0 lb

## 2016-07-23 DIAGNOSIS — E1165 Type 2 diabetes mellitus with hyperglycemia: Secondary | ICD-10-CM | POA: Diagnosis not present

## 2016-07-23 DIAGNOSIS — E1142 Type 2 diabetes mellitus with diabetic polyneuropathy: Secondary | ICD-10-CM

## 2016-07-23 DIAGNOSIS — Z0000 Encounter for general adult medical examination without abnormal findings: Secondary | ICD-10-CM

## 2016-07-23 DIAGNOSIS — I10 Essential (primary) hypertension: Secondary | ICD-10-CM

## 2016-07-23 DIAGNOSIS — Z794 Long term (current) use of insulin: Secondary | ICD-10-CM | POA: Diagnosis not present

## 2016-07-23 DIAGNOSIS — R7989 Other specified abnormal findings of blood chemistry: Secondary | ICD-10-CM

## 2016-07-23 DIAGNOSIS — Z23 Encounter for immunization: Secondary | ICD-10-CM | POA: Diagnosis not present

## 2016-07-23 DIAGNOSIS — IMO0002 Reserved for concepts with insufficient information to code with codable children: Secondary | ICD-10-CM

## 2016-07-23 DIAGNOSIS — Z1159 Encounter for screening for other viral diseases: Secondary | ICD-10-CM

## 2016-07-23 LAB — COMPREHENSIVE METABOLIC PANEL
ALT: 26 U/L (ref 0–35)
AST: 17 U/L (ref 0–37)
Albumin: 4.1 g/dL (ref 3.5–5.2)
Alkaline Phosphatase: 102 U/L (ref 39–117)
BUN: 13 mg/dL (ref 6–23)
CO2: 28 mEq/L (ref 19–32)
Calcium: 9.4 mg/dL (ref 8.4–10.5)
Chloride: 98 mEq/L (ref 96–112)
Creatinine, Ser: 0.7 mg/dL (ref 0.40–1.20)
GFR: 88.08 mL/min (ref >60.00)
Glucose, Bld: 279 mg/dL — ABNORMAL HIGH (ref 70–99)
Potassium: 4.8 mEq/L (ref 3.5–5.1)
Sodium: 134 mEq/L — ABNORMAL LOW (ref 135–145)
Total Bilirubin: 0.4 mg/dL (ref 0.2–1.2)
Total Protein: 7.7 g/dL (ref 6.0–8.3)

## 2016-07-23 LAB — HEMOGLOBIN A1C: Hgb A1c MFr Bld: 12.4 % — ABNORMAL HIGH (ref 4.6–6.5)

## 2016-07-23 LAB — LDL CHOLESTEROL, DIRECT: Direct LDL: 137 mg/dL

## 2016-07-23 LAB — LIPID PANEL
Cholesterol: 240 mg/dL — ABNORMAL HIGH (ref 0–200)
HDL: 37.4 mg/dL — ABNORMAL LOW (ref >39.00)
NonHDL: 202.53
Total CHOL/HDL Ratio: 6
Triglycerides: 326 mg/dL — ABNORMAL HIGH (ref 0.0–149.0)
VLDL: 65.2 mg/dL — ABNORMAL HIGH (ref 0.0–40.0)

## 2016-07-23 LAB — MICROALBUMIN / CREATININE URINE RATIO
Creatinine,U: 103.4 mg/dL
Microalb Creat Ratio: 4.5 mg/g (ref 0.0–30.0)
Microalb, Ur: 4.7 mg/dL — ABNORMAL HIGH (ref 0.0–1.9)

## 2016-07-23 NOTE — Progress Notes (Signed)
   Subjective:    Patient ID: [name], female    DOB: [birth year], 70 y.o.   MRN: [id number]  HPI The patient is a 70 YO female coming in for her wellness. No new complaints but needs follow up of her medical conditions. Please see A/P for status and treatment plan. Has been out of her medications for the last several weeks and has been having significant financial problems during the last year.   [facility], social history reviewed and updated.   Review of Systems  Constitutional: Positive for activity change. Negative for appetite change, chills, fatigue, fever and unexpected weight change.  HENT: Positive for dental problem.   Eyes: Negative.   Respiratory: Negative for cough, chest tightness, shortness of breath and wheezing.   Cardiovascular: Negative for chest pain, palpitations and leg swelling.  Gastrointestinal: Negative for abdominal distention, abdominal pain, blood in stool, constipation and diarrhea.  Musculoskeletal: Positive for arthralgias. Negative for gait problem and myalgias.  Skin: Negative.   Neurological: Positive for numbness. Negative for dizziness, syncope, weakness, light-headedness and headaches.  Psychiatric/Behavioral: Positive for decreased concentration and dysphoric mood. The patient is nervous/anxious.       Objective:   Physical Exam  Constitutional: She is oriented to person, place, and time. She appears well-developed and well-nourished.  Overweight  HENT:  Head: Normocephalic and atraumatic.  Multiple broken teeth  Eyes: EOM are normal.  Neck: Normal range of motion.  Cardiovascular: Normal rate and regular rhythm.   Pulmonary/Chest: Effort normal and breath sounds normal. No respiratory distress. She has no wheezes. She has no rales. She exhibits no tenderness.  Abdominal: Soft. Bowel sounds are normal. She exhibits no distension. There is no tenderness. There is no rebound.  Musculoskeletal: She exhibits no edema.  Neurological: She  is alert and oriented to person, place, and time. Coordination normal.  Skin: Skin is warm and dry.  See foot exam   Vitals:   07/23/16 1446  BP: 138/70  Pulse: 85  Resp: 12  Temp: 98.7 F (37.1 C)  TempSrc: Oral  SpO2: 94%  Weight: 209 lb (94.8 kg)  Height: 5\' 4"  (1.626 m)      Assessment & Plan:  Prevnar 13 given at visit.

## 2016-07-23 NOTE — Progress Notes (Signed)
Pre visit review using our clinic review tool, if applicable. No additional management support is needed unless otherwise documented below in the visit note. 

## 2016-07-23 NOTE — Patient Instructions (Addendum)
We are checking the labs today and will call you back with the results.   Schedule with Manuela Schwartz for your free wellness visit.   Diabetes and Standards of Medical Care Diabetes is complicated. You may find that your diabetes team includes a dietitian, nurse, diabetes educator, eye doctor, and more. To help everyone know what is going on and to help you get the care you deserve, the following schedule of care was developed to help keep you on track. Below are the tests, exams, vaccines, medicines, education, and plans you will need. HbA1c test This test shows how well you have controlled your glucose over the past 2-3 months. It is used to see if your diabetes management plan needs to be adjusted.   It is performed at least 2 times a year if you are meeting treatment goals.  It is performed 4 times a year if therapy has changed or if you are not meeting treatment goals. Blood pressure test  This test is performed at every routine medical visit. The goal is less than 140/90 mm Hg for most people, but 130/80 mm Hg in some cases. Ask your health care provider about your goal. Dental exam  Follow up with the dentist regularly. Eye exam  If you are diagnosed with type 1 diabetes as a child, get an exam upon reaching the age of 51 years or older and having had diabetes for 3-5 years. Yearly eye exams are recommended after that initial eye exam.  If you are diagnosed with type 1 diabetes as an adult, get an exam within 5 years of diagnosis and then yearly.  If you are diagnosed with type 2 diabetes, get an exam as soon as possible after the diagnosis and then yearly. Foot care exam  Visual foot exams are performed at every routine medical visit. The exams check for cuts, injuries, or other problems with the feet.  You should have a complete foot exam performed every year. This exam includes an inspection of the structure and skin of your feet, a check of the pulses in your feet, and a check of the  sensation in your feet.  Type 1 diabetes: The first exam is performed 5 years after diagnosis.  Type 2 diabetes: The first exam is performed at the time of diagnosis.  Check your feet nightly for cuts, injuries, or other problems with your feet. Tell your health care provider if anything is not healing. Kidney function test (urine microalbumin)  This test is performed once a year.  Type 1 diabetes: The first test is performed 5 years after diagnosis.  Type 2 diabetes: The first test is performed at the time of diagnosis.  A serum creatinine and estimated glomerular filtration rate (eGFR) test is done once a year to assess the level of chronic kidney disease (CKD), if present. Lipid profile (cholesterol, HDL, LDL, triglycerides)  Performed every 5 years for most people.  The goal for LDL is less than 100 mg/dL. If you are at high risk, the goal is less than 70 mg/dL.  The goal for HDL is 40 mg/dL-50 mg/dL for men and 50 mg/dL-60 mg/dL for women. An HDL cholesterol of 60 mg/dL or higher gives some protection against heart disease.  The goal for triglycerides is less than 150 mg/dL. Immunizations  The flu (influenza) vaccine is recommended yearly for every person 72 months of age or older who has diabetes.  The pneumonia (pneumococcal) vaccine is recommended for every person 84 years of age or older  who has diabetes. Adults 34 years of age or older may receive the pneumonia vaccine as a series of two separate shots.  The hepatitis B vaccine is recommended for adults shortly after they have been diagnosed with diabetes.  The Tdap (tetanus, diphtheria, and pertussis) vaccine should be given:  According to normal childhood vaccination schedules, for children.  Every 10 years, for adults who have diabetes. Diabetes self-management education  Education is recommended at diagnosis and ongoing as needed. Treatment plan  Your treatment plan is reviewed at every medical visit.   This  information is not intended to replace advice given to you by your health care provider. Make sure you discuss any questions you have with your health care provider.   Document Released: 09/22/2009 Document Revised: 12/16/2014 Document Reviewed: 04/27/2013 Elsevier Interactive Patient Education 2016 [place] Maintenance, Female Adopting a healthy lifestyle and getting preventive care can go a long way to promote health and wellness. Talk with your health care provider about what schedule of regular examinations is right for you. This is a good chance for you to check in with your provider about disease prevention and staying healthy. In between checkups, there are plenty of things you can do on your own. Experts have done a lot of research about which lifestyle changes and preventive measures are most likely to keep you healthy. Ask your health care provider for more information. WEIGHT AND DIET  Eat a healthy diet  Be sure to include plenty of vegetables, fruits, low-fat dairy products, and lean protein.  Do not eat a lot of foods high in solid fats, added sugars, or salt.  Get regular exercise. This is one of the most important things you can do for your health.  Most adults should exercise for at least 150 minutes each week. The exercise should increase your heart rate and make you sweat (moderate-intensity exercise).  Most adults should also do strengthening exercises at least twice a week. This is in addition to the moderate-intensity exercise.  Maintain a healthy weight  Body mass index (BMI) is a measurement that can be used to identify possible weight problems. It estimates body fat based on height and weight. Your health care provider can help determine your BMI and help you achieve or maintain a healthy weight.  For females 16 years of age and older:   A BMI below 18.5 is considered underweight.  A BMI of 18.5 to 24.9 is normal.  A BMI of 25 to 29.9 is  considered overweight.  A BMI of 30 and above is considered obese.  Watch levels of cholesterol and blood lipids  You should start having your blood tested for lipids and cholesterol at 69 years of age, then have this test every 5 years.  You may need to have your cholesterol levels checked more often if:  Your lipid or cholesterol levels are high.  You are older than 69 years of age.  You are at high risk for heart disease.  CANCER SCREENING   Lung Cancer  Lung cancer screening is recommended for adults 11-38 years old who are at high risk for lung cancer because of a history of smoking.  A yearly low-dose CT scan of the lungs is recommended for people who:  Currently smoke.  Have quit within the past 15 years.  Have at least a 30-pack-year history of smoking. A pack year is smoking an average of one pack of cigarettes a day for 1 year.  Yearly screening  should continue until it has been 15 years since you quit.  Yearly screening should stop if you develop a health problem that would prevent you from having lung cancer treatment.  Breast Cancer  Practice breast self-awareness. This means understanding how your breasts normally appear and feel.  It also means doing regular breast self-exams. Let your health care provider know about any changes, no matter how small.  If you are in your 20s or 30s, you should have a clinical breast exam (CBE) by a health care provider every 1-3 years as part of a regular health exam.  If you are 59 or older, have a CBE every year. Also consider having a breast X-ray (mammogram) every year.  If you have a family history of breast cancer, talk to your health care provider about genetic screening.  If you are at high risk for breast cancer, talk to your health care provider about having an MRI and a mammogram every year.  Breast cancer gene (BRCA) assessment is recommended for women who have family members with BRCA-related cancers.  BRCA-related cancers include:  Breast.  Ovarian.  Tubal.  Peritoneal cancers.  Results of the assessment will determine the need for genetic counseling and BRCA1 and BRCA2 testing. Cervical Cancer Your health care provider may recommend that you be screened regularly for cancer of the pelvic organs (ovaries, uterus, and vagina). This screening involves a pelvic examination, including checking for microscopic changes to the surface of your cervix (Pap test). You may be encouraged to have this screening done every 3 years, beginning at age 56.  For women ages 5-65, health care providers may recommend pelvic exams and Pap testing every 3 years, or they may recommend the Pap and pelvic exam, combined with testing for human papilloma virus (HPV), every 5 years. Some types of HPV increase your risk of cervical cancer. Testing for HPV may also be done on women of any age with unclear Pap test results.  Other health care providers may not recommend any screening for nonpregnant women who are considered low risk for pelvic cancer and who do not have symptoms. Ask your health care provider if a screening pelvic exam is right for you.  If you have had past treatment for cervical cancer or a condition that could lead to cancer, you need Pap tests and screening for cancer for at least 20 years after your treatment. If Pap tests have been discontinued, your risk factors (such as having a new sexual partner) need to be reassessed to determine if screening should resume. Some women have medical problems that increase the chance of getting cervical cancer. In these cases, your health care provider may recommend more frequent screening and Pap tests. Colorectal Cancer  This type of cancer can be detected and often prevented.  Routine colorectal cancer screening usually begins at 69 years of age and continues through 69 years of age.  Your health care provider may recommend screening at an earlier age if you  have risk factors for colon cancer.  Your health care provider may also recommend using home test kits to check for hidden blood in the stool.  A small camera at the end of a tube can be used to examine your colon directly (sigmoidoscopy or colonoscopy). This is done to check for the earliest forms of colorectal cancer.  Routine screening usually begins at age 73.  Direct examination of the colon should be repeated every 5-10 years through 69 years of age. However, you may need  to be screened more often if early forms of precancerous polyps or small growths are found. Skin Cancer  Check your skin from head to toe regularly.  Tell your health care provider about any new moles or changes in moles, especially if there is a change in a mole's shape or color.  Also tell your health care provider if you have a mole that is larger than the size of a pencil eraser.  Always use sunscreen. Apply sunscreen liberally and repeatedly throughout the day.  Protect yourself by wearing long sleeves, pants, a wide-brimmed hat, and sunglasses whenever you are outside. HEART DISEASE, DIABETES, AND HIGH BLOOD PRESSURE   High blood pressure causes heart disease and increases the risk of stroke. High blood pressure is more likely to develop in:  People who have blood pressure in the high end of the normal range (130-139/85-89 mm Hg).  People who are overweight or obese.  People who are African American.  If you are 81-55 years of age, have your blood pressure checked every 3-5 years. If you are 20 years of age or older, have your blood pressure checked every year. You should have your blood pressure measured twice--once when you are at a hospital or clinic, and once when you are not at a hospital or clinic. Record the average of the two measurements. To check your blood pressure when you are not at a hospital or clinic, you can use:  An automated blood pressure machine at a pharmacy.  A home blood pressure  monitor.  If you are between 11 years and 26 years old, ask your health care provider if you should take aspirin to prevent strokes.  Have regular diabetes screenings. This involves taking a blood sample to check your fasting blood sugar level.  If you are at a normal weight and have a low risk for diabetes, have this test once every three years after 69 years of age.  If you are overweight and have a high risk for diabetes, consider being tested at a younger age or more often. PREVENTING INFECTION  Hepatitis B  If you have a higher risk for hepatitis B, you should be screened for this virus. You are considered at high risk for hepatitis B if:  You were born in a country where hepatitis B is common. Ask your health care provider which countries are considered high risk.  Your parents were born in a high-risk country, and you have not been immunized against hepatitis B (hepatitis B vaccine).  You have HIV or AIDS.  You use needles to inject street drugs.  You live with someone who has hepatitis B.  You have had sex with someone who has hepatitis B.  You get hemodialysis treatment.  You take certain medicines for conditions, including cancer, organ transplantation, and autoimmune conditions. Hepatitis C  Blood testing is recommended for:  Everyone born from [birth year] through 1964.  Anyone with known risk factors for hepatitis C. Sexually transmitted infections (STIs)  You should be screened for sexually transmitted infections (STIs) including gonorrhea and chlamydia if:  You are sexually active and are younger than 69 years of age.  You are older than 69 years of age and your health care provider tells you that you are at risk for this type of infection.  Your sexual activity has changed since you were last screened and you are at an increased risk for chlamydia or gonorrhea. Ask your health care provider if you are at risk.  If you  do not have HIV, but are at risk, it may be  recommended that you take a prescription medicine daily to prevent HIV infection. This is called pre-exposure prophylaxis (PrEP). You are considered at risk if:  You are sexually active and do not regularly use condoms or know the HIV status of your partner(s).  You take drugs by injection.  You are sexually active with a partner who has HIV. Talk with your health care provider about whether you are at high risk of being infected with HIV. If you choose to begin PrEP, you should first be tested for HIV. You should then be tested every 3 months for as long as you are taking PrEP.  PREGNANCY   If you are premenopausal and you may become pregnant, ask your health care provider about preconception counseling.  If you may become pregnant, take 400 to 800 micrograms (mcg) of folic acid every day.  If you want to prevent pregnancy, talk to your health care provider about birth control (contraception). OSTEOPOROSIS AND MENOPAUSE   Osteoporosis is a disease in which the bones lose minerals and strength with aging. This can result in serious bone fractures. Your risk for osteoporosis can be identified using a bone density scan.  If you are 60 years of age or older, or if you are at risk for osteoporosis and fractures, ask your health care provider if you should be screened.  Ask your health care provider whether you should take a calcium or vitamin D supplement to lower your risk for osteoporosis.  Menopause may have certain physical symptoms and risks.  Hormone replacement therapy may reduce some of these symptoms and risks. Talk to your health care provider about whether hormone replacement therapy is right for you.  HOME CARE INSTRUCTIONS   Schedule regular health, dental, and eye exams.  Stay current with your immunizations.   Do not use any tobacco products including cigarettes, chewing tobacco, or electronic cigarettes.  If you are pregnant, do not drink alcohol.  If you are  breastfeeding, limit how much and how often you drink alcohol.  Limit alcohol intake to no more than 1 drink per day for nonpregnant women. One drink equals 12 ounces of beer, 5 ounces of wine, or 1 ounces of hard liquor.  Do not use street drugs.  Do not share needles.  Ask your health care provider for help if you need support or information about quitting drugs.  Tell your health care provider if you often feel depressed.  Tell your health care provider if you have ever been abused or do not feel safe at home.   This information is not intended to replace advice given to you by your health care provider. Make sure you discuss any questions you have with your health care provider.   Document Released: 06/10/2011 Document Revised: 12/16/2014 Document Reviewed: 10/27/2013 Elsevier Interactive Patient Education [employer].

## 2016-07-24 LAB — HEPATITIS C ANTIBODY: HCV Ab: NEGATIVE

## 2016-07-24 NOTE — Assessment & Plan Note (Signed)
Foot exam done today and reminded that she is due for eye exam. Her diabetes is not controlled and has not been. She is taking her humalog and humulin and metformin and not checking her sugars. Suspect worse control since she has not been in for 10 months. She saw endocrinology but has also not been back to see them either. She is not able to commit to heathy foods with her financial problems.

## 2016-07-24 NOTE — Assessment & Plan Note (Signed)
Checking hep c screening, colonoscopy and mammogram up to date. Checking labs. Counseled on sun safety and mole surveillance. Given screening recommendations.

## 2016-07-24 NOTE — Assessment & Plan Note (Signed)
BP at goal on lasix, lisinopril. Checking CMP and adjust as needed.
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
## 2016-07-29 ENCOUNTER — Ambulatory Visit (INDEPENDENT_AMBULATORY_CARE_PROVIDER_SITE_OTHER): Payer: Medicare Other

## 2016-07-29 ENCOUNTER — Telehealth: Payer: Self-pay

## 2016-07-29 VITALS — BP 130/70 | HR 74 | Ht 64.0 in | Wt 203.0 lb

## 2016-07-29 DIAGNOSIS — Z0000 Encounter for general adult medical examination without abnormal findings: Secondary | ICD-10-CM | POA: Diagnosis not present

## 2016-07-29 DIAGNOSIS — Z23 Encounter for immunization: Secondary | ICD-10-CM

## 2016-07-29 NOTE — Telephone Encounter (Signed)
Dr. Okey Duprerawford, This patient was in for AWV. States her NPH humulin pen will last for approx one more week, but can't afford to continue due to being in the doughnut. Will cost over 1000.00 .   Request to use vial and draw her own insulin up or other suggestion/ asked the pharmacy and they are still trying to refill the pen.   Also not taking Zetia;   Please advise  Tks

## 2016-07-29 NOTE — Patient Instructions (Addendum)
[name] , Thank you for taking time to come for your Medicare Wellness Visit. I appreciate your ongoing commitment to your health goals. Please review the following plan we discussed and let me know if I can assist you in the future.   Will discuss insulin issues with Dr. Sharlet Salina   Will take flu shot today  Please fup with resources for food to get assistance     These are the goals we discussed: Goals    . Blood Pressure < 140/90    . HEMOGLOBIN A1C < 7.0    . LDL CALC < 100    . patient          Need to socialize  [facility] Address: [address] [place], [place] [address]  Hours:  Open today  8AM-8PM [phone]   [facility]; [phone] [address]; [phone]  Dept of Social Services; Call [phone] and ask for SW on call    [facility]- they can assist with long term care planing; HOUSING  Address: [address], [place], [place] [address]  Phone: [phone]        . Prevent Falls       This is a list of the screening recommended for you and due dates:  Health Maintenance  Topic Date Due  . Flu Shot  07/09/2016  . Eye exam for diabetics  07/11/2016  . Hemoglobin A1C  10/23/2016  . Mammogram  05/24/2017  . Complete foot exam   07/23/2017  . Lipid (cholesterol) test  07/23/2017  . Colon Cancer Screening  07/01/2022  . Tetanus Vaccine  08/27/2022  . DEXA scan (bone density measurement)  Completed  . Shingles Vaccine  Completed  .  Hepatitis C: One time screening is recommended by Center for Disease Control  (CDC) for  adults born from [birth year] through 1964.   Completed  . Pneumonia vaccines  Completed      Fat and Cholesterol Restricted Diet High levels of fat and cholesterol in your blood may lead to various health problems, such as diseases of the heart, blood vessels, gallbladder, liver, and pancreas. Fats are concentrated sources of energy that come in various forms. Certain types of  fat, including saturated fat, may be harmful in excess. Cholesterol is a substance needed by your body in small amounts. Your body makes all the cholesterol it needs. Excess cholesterol comes from the food you eat. When you have high levels of cholesterol and saturated fat in your blood, health problems can develop because the excess fat and cholesterol will gather along the walls of your blood vessels, causing them to narrow. Choosing the right foods will help you control your intake of fat and cholesterol. This will help keep the levels of these substances in your blood within normal limits and reduce your risk of disease. WHAT IS MY PLAN? Your health care provider recommends that you:  Get no more than __________ % of the total calories in your daily diet from fat.  Limit your intake of saturated fat to less than ______% of your total calories each day.  Limit the amount of cholesterol in your diet to less than _________mg per day. WHAT TYPES OF FAT SHOULD I CHOOSE?  Choose healthy fats more often. Choose monounsaturated and polyunsaturated fats, such as olive and canola oil, flaxseeds, walnuts, almonds, and seeds.  Eat more omega-3 fats. Good choices include salmon, mackerel, sardines, tuna, flaxseed oil, and ground flaxseeds. Aim to eat fish at least  two times a week.  Limit saturated fats. Saturated fats are primarily found in animal products, such as meats, butter, and cream. Plant sources of saturated fats include palm oil, palm kernel oil, and coconut oil.  Avoid foods with partially hydrogenated oils in them. These contain trans fats. Examples of foods that contain trans fats are stick margarine, some tub margarines, cookies, crackers, and other baked goods. WHAT GENERAL GUIDELINES DO I NEED TO FOLLOW? These guidelines for healthy eating will help you control your intake of fat and cholesterol:  Check food labels carefully to identify foods with trans fats or high amounts of saturated  fat.  Fill one half of your plate with vegetables and green salads.  Fill one fourth of your plate with whole grains. Look for the word "whole" as the first word in the ingredient list.  Fill one fourth of your plate with lean protein foods.  Limit fruit to two servings a day. Choose fruit instead of juice.  Eat more foods that contain soluble fiber. Examples of foods that contain this type of fiber are apples, broccoli, carrots, beans, peas, and barley. Aim to get 20-30 g of fiber per day.  Eat more home-cooked food and less restaurant, buffet, and fast food.  Limit or avoid alcohol.  Limit foods high in starch and sugar.  Limit fried foods.  Cook foods using methods other than frying. Baking, boiling, grilling, and broiling are all great options.  Lose weight if you are overweight. Losing just 5-10% of your initial body weight can help your overall health and prevent diseases such as diabetes and heart disease. WHAT FOODS CAN I EAT? Grains Whole grains, such as whole wheat or whole grain breads, crackers, cereals, and pasta. Unsweetened oatmeal, bulgur, barley, quinoa, or brown rice. Corn or whole wheat flour tortillas. Vegetables Fresh or frozen vegetables (raw, steamed, roasted, or grilled). Green salads. Fruits All fresh, canned (in natural juice), or frozen fruits. Meat and Other Protein Products Ground beef (85% or leaner), grass-fed beef, or beef trimmed of fat. Skinless chicken or [place]. Ground chicken or [place]. Pork trimmed of fat. All fish and seafood. Eggs. Dried beans, peas, or lentils. Unsalted nuts or seeds. Unsalted canned or dry beans. Dairy Low-fat dairy products, such as skim or 1% milk, 2% or reduced-fat cheeses, low-fat ricotta or cottage cheese, or plain low-fat yogurt. Fats and Oils Tub margarines without trans fats. Light or reduced-fat mayonnaise and salad dressings. Avocado. Olive, canola, sesame, or safflower oils. Natural peanut or almond butter  (choose ones without added sugar and oil). The items listed above may not be a complete list of recommended foods or beverages. Contact your dietitian for more options. WHAT FOODS ARE NOT RECOMMENDED? Grains White bread. White pasta. White rice. Cornbread. Bagels, pastries, and croissants. Crackers that contain trans fat. Vegetables White potatoes. Corn. Creamed or fried vegetables. Vegetables in a cheese sauce. Fruits Dried fruits. Canned fruit in light or heavy syrup. Fruit juice. Meat and Other Protein Products Fatty cuts of meat. Ribs, chicken wings, bacon, sausage, bologna, salami, chitterlings, fatback, hot dogs, bratwurst, and packaged luncheon meats. Liver and organ meats. Dairy Whole or 2% milk, cream, half-and-half, and cream cheese. Whole milk cheeses. Whole-fat or sweetened yogurt. Full-fat cheeses. Nondairy creamers and whipped toppings. Processed cheese, cheese spreads, or cheese curds. Sweets and Desserts Corn syrup, sugars, honey, and molasses. Candy. Jam and jelly. Syrup. Sweetened cereals. Cookies, pies, cakes, donuts, muffins, and ice cream. Fats and Oils Butter, stick margarine, lard, shortening, ghee,  or bacon fat. Coconut, palm kernel, or palm oils. Beverages Alcohol. Sweetened drinks (such as sodas, lemonade, and fruit drinks or punches). The items listed above may not be a complete list of foods and beverages to avoid. Contact your dietitian for more information.   This information is not intended to replace advice given to you by your health care provider. Make sure you discuss any questions you have with your health care provider.   Document Released: 11/25/2005 Document Revised: 12/16/2014 Document Reviewed: 02/23/2014 Elsevier Interactive Patient Education 2016 [place] in the Home  Falls can cause injuries. They can happen to people of all ages. There are many things you can do to make your home safe and to help prevent falls.  WHAT  CAN I DO ON THE OUTSIDE OF MY HOME?  Regularly fix the edges of walkways and driveways and fix any cracks.  Remove anything that might make you trip as you walk through a door, such as a raised step or threshold.  Trim any bushes or trees on the path to your home.  Use bright outdoor lighting.  Clear any walking paths of anything that might make someone trip, such as rocks or tools.  Regularly check to see if handrails are loose or broken. Make sure that both sides of any steps have handrails.  Any raised decks and porches should have guardrails on the edges.  Have any leaves, snow, or ice cleared regularly.  Use sand or salt on walking paths during winter.  Clean up any spills in your garage right away. This includes oil or grease spills. WHAT CAN I DO IN THE BATHROOM?   Use night lights.  Install grab bars by the toilet and in the tub and shower. Do not use towel bars as grab bars.  Use non-skid mats or decals in the tub or shower.  If you need to sit down in the shower, use a plastic, non-slip stool.  Keep the floor dry. Clean up any water that spills on the floor as soon as it happens.  Remove soap buildup in the tub or shower regularly.  Attach bath mats securely with double-sided non-slip rug tape.  Do not have throw rugs and other things on the floor that can make you trip. WHAT CAN I DO IN THE BEDROOM?  Use night lights.  Make sure that you have a light by your bed that is easy to reach.  Do not use any sheets or blankets that are too big for your bed. They should not hang down onto the floor.  Have a firm chair that has side arms. You can use this for support while you get dressed.  Do not have throw rugs and other things on the floor that can make you trip. WHAT CAN I DO IN THE KITCHEN?  Clean up any spills right away.  Avoid walking on wet floors.  Keep items that you use a lot in easy-to-reach places.  If you need to reach something above you, use  a strong step stool that has a grab bar.  Keep electrical cords out of the way.  Do not use floor polish or wax that makes floors slippery. If you must use wax, use non-skid floor wax.  Do not have throw rugs and other things on the floor that can make you trip. WHAT CAN I DO WITH MY STAIRS?  Do not leave any items on the stairs.  Make sure that there are handrails on  both sides of the stairs and use them. Fix handrails that are broken or loose. Make sure that handrails are as long as the stairways.  Check any carpeting to make sure that it is firmly attached to the stairs. Fix any carpet that is loose or worn.  Avoid having throw rugs at the top or bottom of the stairs. If you do have throw rugs, attach them to the floor with carpet tape.  Make sure that you have a light switch at the top of the stairs and the bottom of the stairs. If you do not have them, ask someone to add them for you. WHAT ELSE CAN I DO TO HELP PREVENT FALLS?  Wear shoes that:  Do not have high heels.  Have rubber bottoms.  Are comfortable and fit you well.  Are closed at the toe. Do not wear sandals.  If you use a stepladder:  Make sure that it is fully opened. Do not climb a closed stepladder.  Make sure that both sides of the stepladder are locked into place.  Ask someone to hold it for you, if possible.  Clearly mark and make sure that you can see:  Any grab bars or handrails.  First and last steps.  Where the edge of each step is.  Use tools that help you move around (mobility aids) if they are needed. These include:  Canes.  Walkers.  Scooters.  Crutches.  Turn on the lights when you go into a dark area. Replace any light bulbs as soon as they burn out.  Set up your furniture so you have a clear path. Avoid moving your furniture around.  If any of your floors are uneven, fix them.  If there are any pets around you, be aware of where they are.  Review your medicines with your  doctor. Some medicines can make you feel dizzy. This can increase your chance of falling. Ask your doctor what other things that you can do to help prevent falls.   This information is not intended to replace advice given to you by your health care provider. Make sure you discuss any questions you have with your health care provider.   Document Released: 09/21/2009 Document Revised: 04/11/2015 Document Reviewed: 12/30/2014 Elsevier Interactive Patient Education 2016 [place] Maintenance, Female Adopting a healthy lifestyle and getting preventive care can go a long way to promote health and wellness. Talk with your health care provider about what schedule of regular examinations is right for you. This is a good chance for you to check in with your provider about disease prevention and staying healthy. In between checkups, there are plenty of things you can do on your own. Experts have done a lot of research about which lifestyle changes and preventive measures are most likely to keep you healthy. Ask your health care provider for more information. WEIGHT AND DIET  Eat a healthy diet  Be sure to include plenty of vegetables, fruits, low-fat dairy products, and lean protein.  Do not eat a lot of foods high in solid fats, added sugars, or salt.  Get regular exercise. This is one of the most important things you can do for your health.  Most adults should exercise for at least 150 minutes each week. The exercise should increase your heart rate and make you sweat (moderate-intensity exercise).  Most adults should also do strengthening exercises at least twice a week. This is in addition to the moderate-intensity exercise.  Maintain a healthy weight  Body mass index (BMI) is a measurement that can be used to identify possible weight problems. It estimates body fat based on height and weight. Your health care provider can help determine your BMI and help you achieve or maintain a  healthy weight.  For females 76 years of age and older:   A BMI below 18.5 is considered underweight.  A BMI of 18.5 to 24.9 is normal.  A BMI of 25 to 29.9 is considered overweight.  A BMI of 30 and above is considered obese.  Watch levels of cholesterol and blood lipids  You should start having your blood tested for lipids and cholesterol at 69 years of age, then have this test every 5 years.  You may need to have your cholesterol levels checked more often if:  Your lipid or cholesterol levels are high.  You are older than 69 years of age.  You are at high risk for heart disease.  CANCER SCREENING   Lung Cancer  Lung cancer screening is recommended for adults 52-70 years old who are at high risk for lung cancer because of a history of smoking.  A yearly low-dose CT scan of the lungs is recommended for people who:  Currently smoke.  Have quit within the past 15 years.  Have at least a 30-pack-year history of smoking. A pack year is smoking an average of one pack of cigarettes a day for 1 year.  Yearly screening should continue until it has been 15 years since you quit.  Yearly screening should stop if you develop a health problem that would prevent you from having lung cancer treatment.  Breast Cancer  Practice breast self-awareness. This means understanding how your breasts normally appear and feel.  It also means doing regular breast self-exams. Let your health care provider know about any changes, no matter how small.  If you are in your 20s or 30s, you should have a clinical breast exam (CBE) by a health care provider every 1-3 years as part of a regular health exam.  If you are 52 or older, have a CBE every year. Also consider having a breast X-ray (mammogram) every year.  If you have a family history of breast cancer, talk to your health care provider about genetic screening.  If you are at high risk for breast cancer, talk to your health care provider  about having an MRI and a mammogram every year.  Breast cancer gene (BRCA) assessment is recommended for women who have family members with BRCA-related cancers. BRCA-related cancers include:  Breast.  Ovarian.  Tubal.  Peritoneal cancers.  Results of the assessment will determine the need for genetic counseling and BRCA1 and BRCA2 testing. Cervical Cancer Your health care provider may recommend that you be screened regularly for cancer of the pelvic organs (ovaries, uterus, and vagina). This screening involves a pelvic examination, including checking for microscopic changes to the surface of your cervix (Pap test). You may be encouraged to have this screening done every 3 years, beginning at age 80.  For women ages 84-65, health care providers may recommend pelvic exams and Pap testing every 3 years, or they may recommend the Pap and pelvic exam, combined with testing for human papilloma virus (HPV), every 5 years. Some types of HPV increase your risk of cervical cancer. Testing for HPV may also be done on women of any age with unclear Pap test results.  Other health care providers may not recommend any screening for nonpregnant women who are considered  low risk for pelvic cancer and who do not have symptoms. Ask your health care provider if a screening pelvic exam is right for you.  If you have had past treatment for cervical cancer or a condition that could lead to cancer, you need Pap tests and screening for cancer for at least 20 years after your treatment. If Pap tests have been discontinued, your risk factors (such as having a new sexual partner) need to be reassessed to determine if screening should resume. Some women have medical problems that increase the chance of getting cervical cancer. In these cases, your health care provider may recommend more frequent screening and Pap tests. Colorectal Cancer  This type of cancer can be detected and often prevented.  Routine colorectal  cancer screening usually begins at 69 years of age and continues through 69 years of age.  Your health care provider may recommend screening at an earlier age if you have risk factors for colon cancer.  Your health care provider may also recommend using home test kits to check for hidden blood in the stool.  A small camera at the end of a tube can be used to examine your colon directly (sigmoidoscopy or colonoscopy). This is done to check for the earliest forms of colorectal cancer.  Routine screening usually begins at age 16.  Direct examination of the colon should be repeated every 5-10 years through 69 years of age. However, you may need to be screened more often if early forms of precancerous polyps or small growths are found. Skin Cancer  Check your skin from head to toe regularly.  Tell your health care provider about any new moles or changes in moles, especially if there is a change in a mole's shape or color.  Also tell your health care provider if you have a mole that is larger than the size of a pencil eraser.  Always use sunscreen. Apply sunscreen liberally and repeatedly throughout the day.  Protect yourself by wearing long sleeves, pants, a wide-brimmed hat, and sunglasses whenever you are outside. HEART DISEASE, DIABETES, AND HIGH BLOOD PRESSURE   High blood pressure causes heart disease and increases the risk of stroke. High blood pressure is more likely to develop in:  People who have blood pressure in the high end of the normal range (130-139/85-89 mm Hg).  People who are overweight or obese.  People who are African American.  If you are 3-33 years of age, have your blood pressure checked every 3-5 years. If you are 28 years of age or older, have your blood pressure checked every year. You should have your blood pressure measured twice--once when you are at a hospital or clinic, and once when you are not at a hospital or clinic. Record the average of the two  measurements. To check your blood pressure when you are not at a hospital or clinic, you can use:  An automated blood pressure machine at a pharmacy.  A home blood pressure monitor.  If you are between 36 years and 61 years old, ask your health care provider if you should take aspirin to prevent strokes.  Have regular diabetes screenings. This involves taking a blood sample to check your fasting blood sugar level.  If you are at a normal weight and have a low risk for diabetes, have this test once every three years after 69 years of age.  If you are overweight and have a high risk for diabetes, consider being tested at a younger age or  more often. PREVENTING INFECTION  Hepatitis B  If you have a higher risk for hepatitis B, you should be screened for this virus. You are considered at high risk for hepatitis B if:  You were born in a country where hepatitis B is common. Ask your health care provider which countries are considered high risk.  Your parents were born in a high-risk country, and you have not been immunized against hepatitis B (hepatitis B vaccine).  You have HIV or AIDS.  You use needles to inject street drugs.  You live with someone who has hepatitis B.  You have had sex with someone who has hepatitis B.  You get hemodialysis treatment.  You take certain medicines for conditions, including cancer, organ transplantation, and autoimmune conditions. Hepatitis C  Blood testing is recommended for:  Everyone born from [birth year] through 1964.  Anyone with known risk factors for hepatitis C. Sexually transmitted infections (STIs)  You should be screened for sexually transmitted infections (STIs) including gonorrhea and chlamydia if:  You are sexually active and are younger than 69 years of age.  You are older than 69 years of age and your health care provider tells you that you are at risk for this type of infection.  Your sexual activity has changed since you were  last screened and you are at an increased risk for chlamydia or gonorrhea. Ask your health care provider if you are at risk.  If you do not have HIV, but are at risk, it may be recommended that you take a prescription medicine daily to prevent HIV infection. This is called pre-exposure prophylaxis (PrEP). You are considered at risk if:  You are sexually active and do not regularly use condoms or know the HIV status of your partner(s).  You take drugs by injection.  You are sexually active with a partner who has HIV. Talk with your health care provider about whether you are at high risk of being infected with HIV. If you choose to begin PrEP, you should first be tested for HIV. You should then be tested every 3 months for as long as you are taking PrEP.  PREGNANCY   If you are premenopausal and you may become pregnant, ask your health care provider about preconception counseling.  If you may become pregnant, take 400 to 800 micrograms (mcg) of folic acid every day.  If you want to prevent pregnancy, talk to your health care provider about birth control (contraception). OSTEOPOROSIS AND MENOPAUSE   Osteoporosis is a disease in which the bones lose minerals and strength with aging. This can result in serious bone fractures. Your risk for osteoporosis can be identified using a bone density scan.  If you are 52 years of age or older, or if you are at risk for osteoporosis and fractures, ask your health care provider if you should be screened.  Ask your health care provider whether you should take a calcium or vitamin D supplement to lower your risk for osteoporosis.  Menopause may have certain physical symptoms and risks.  Hormone replacement therapy may reduce some of these symptoms and risks. Talk to your health care provider about whether hormone replacement therapy is right for you.  HOME CARE INSTRUCTIONS   Schedule regular health, dental, and eye exams.  Stay current with your  immunizations.   Do not use any tobacco products including cigarettes, chewing tobacco, or electronic cigarettes.  If you are pregnant, do not drink alcohol.  If you are breastfeeding, limit how much  and how often you drink alcohol.  Limit alcohol intake to no more than 1 drink per day for nonpregnant women. One drink equals 12 ounces of beer, 5 ounces of wine, or 1 ounces of hard liquor.  Do not use street drugs.  Do not share needles.  Ask your health care provider for help if you need support or information about quitting drugs.  Tell your health care provider if you often feel depressed.  Tell your health care provider if you have ever been abused or do not feel safe at home.   This information is not intended to replace advice given to you by your health care provider. Make sure you discuss any questions you have with your health care provider.   Document Released: 06/10/2011 Document Revised: 12/16/2014 Document Reviewed: 10/27/2013 Elsevier Interactive Patient Education [employer].

## 2016-07-30 MED ORDER — INSULIN NPH (HUMAN) (ISOPHANE) 100 UNIT/ML ~~LOC~~ SUSP: 75.0000 [IU] | Freq: Every day | SUBCUTANEOUS | 6 refills | Status: DC

## 2016-07-30 NOTE — Telephone Encounter (Signed)
Sent in the NPH vial for her to use if that is more affordable.

## 2016-07-30 NOTE — Telephone Encounter (Signed)
Call to the patient to let her know she could pick up the NPH vial when she is ready

## 2016-08-02 ENCOUNTER — Telehealth: Payer: Self-pay

## 2016-08-02 NOTE — Telephone Encounter (Signed)
LVM for pt to call back as soon as possible.   RE: Has pt had an eye exam this year. If so, where?

## 2016-08-22 NOTE — Progress Notes (Signed)
Medical screening examination/treatment/procedure(s) were performed by non-physician practitioner and as supervising physician I was immediately available for consultation/collaboration. I agree with above. [name] Crawford, MD 

## 2016-09-05 ENCOUNTER — Other Ambulatory Visit: Payer: Self-pay | Admitting: Internal Medicine

## 2016-09-09 ENCOUNTER — Other Ambulatory Visit: Payer: Self-pay | Admitting: Internal Medicine

## 2016-10-23 ENCOUNTER — Telehealth: Payer: Self-pay | Admitting: Internal Medicine

## 2016-10-23 NOTE — Telephone Encounter (Signed)
[employer] # [phone]   [facility]

## 2016-11-10 ENCOUNTER — Other Ambulatory Visit: Payer: Self-pay | Admitting: Internal Medicine

## 2016-11-10 DIAGNOSIS — E1165 Type 2 diabetes mellitus with hyperglycemia: Secondary | ICD-10-CM

## 2016-11-10 DIAGNOSIS — E1149 Type 2 diabetes mellitus with other diabetic neurological complication: Secondary | ICD-10-CM

## 2016-11-10 DIAGNOSIS — IMO0002 Reserved for concepts with insufficient information to code with codable children: Secondary | ICD-10-CM

## 2016-11-12 ENCOUNTER — Other Ambulatory Visit: Payer: Self-pay | Admitting: General Practice

## 2016-11-15 ENCOUNTER — Telehealth: Payer: Self-pay | Admitting: *Deleted

## 2016-11-15 MED ORDER — EZETIMIBE 10 MG PO TABS: 10.0000 mg | ORAL_TABLET | Freq: Every day | ORAL | 0 refills | Status: DC

## 2016-11-15 NOTE — Telephone Encounter (Signed)
rx has been sent...[name]/lmb

## 2016-11-15 NOTE — Telephone Encounter (Signed)
Rec'd fax stating pt is needing rwefill on her Zetia. [facility] Pls advise if ok to refill...Raechel Chute/lmb

## 2016-11-15 NOTE — Telephone Encounter (Signed)
Ok to refill for 90 day supply 

## 2016-12-18 ENCOUNTER — Other Ambulatory Visit: Payer: Self-pay | Admitting: Internal Medicine

## 2017-01-21 ENCOUNTER — Other Ambulatory Visit: Payer: Self-pay | Admitting: Internal Medicine

## 2017-03-21 ENCOUNTER — Encounter: Payer: Self-pay | Admitting: Internal Medicine

## 2017-03-21 ENCOUNTER — Ambulatory Visit (INDEPENDENT_AMBULATORY_CARE_PROVIDER_SITE_OTHER): Payer: Medicare Other | Admitting: Internal Medicine

## 2017-03-21 ENCOUNTER — Other Ambulatory Visit (INDEPENDENT_AMBULATORY_CARE_PROVIDER_SITE_OTHER): Payer: Medicare Other

## 2017-03-21 VITALS — BP 130/72 | HR 85 | Temp 98.6°F | Resp 14 | Ht 64.0 in | Wt 196.0 lb

## 2017-03-21 DIAGNOSIS — E1169 Type 2 diabetes mellitus with other specified complication: Secondary | ICD-10-CM | POA: Diagnosis not present

## 2017-03-21 DIAGNOSIS — E1142 Type 2 diabetes mellitus with diabetic polyneuropathy: Secondary | ICD-10-CM

## 2017-03-21 DIAGNOSIS — Z794 Long term (current) use of insulin: Secondary | ICD-10-CM

## 2017-03-21 DIAGNOSIS — IMO0002 Reserved for concepts with insufficient information to code with codable children: Secondary | ICD-10-CM

## 2017-03-21 DIAGNOSIS — E1165 Type 2 diabetes mellitus with hyperglycemia: Secondary | ICD-10-CM | POA: Diagnosis not present

## 2017-03-21 DIAGNOSIS — E785 Hyperlipidemia, unspecified: Secondary | ICD-10-CM | POA: Diagnosis not present

## 2017-03-21 LAB — LIPID PANEL
Cholesterol: 244 mg/dL — ABNORMAL HIGH (ref 0–200)
HDL: 30.9 mg/dL — ABNORMAL LOW (ref >39.00)
NonHDL: 212.72
Total CHOL/HDL Ratio: 8
Triglycerides: 395 mg/dL — ABNORMAL HIGH (ref 0.0–149.0)
VLDL: 79 mg/dL — ABNORMAL HIGH (ref 0.0–40.0)

## 2017-03-21 LAB — COMPREHENSIVE METABOLIC PANEL
ALT: 17 U/L (ref 0–35)
AST: 13 U/L (ref 0–37)
Albumin: 3.8 g/dL (ref 3.5–5.2)
Alkaline Phosphatase: 90 U/L (ref 39–117)
BUN: 15 mg/dL (ref 6–23)
CO2: 31 mEq/L (ref 19–32)
Calcium: 9.1 mg/dL (ref 8.4–10.5)
Chloride: 96 mEq/L (ref 96–112)
Creatinine, Ser: 0.73 mg/dL (ref 0.40–1.20)
GFR: 83.75 mL/min (ref >60.00)
Glucose, Bld: 386 mg/dL — ABNORMAL HIGH (ref 70–99)
Potassium: 4 mEq/L (ref 3.5–5.1)
Sodium: 132 mEq/L — ABNORMAL LOW (ref 135–145)
Total Bilirubin: 0.5 mg/dL (ref 0.2–1.2)
Total Protein: 7.2 g/dL (ref 6.0–8.3)

## 2017-03-21 LAB — HEMOGLOBIN A1C: Hgb A1c MFr Bld: 15.3 % — ABNORMAL HIGH (ref 4.6–6.5)

## 2017-03-21 LAB — LDL CHOLESTEROL, DIRECT: Direct LDL: 131 mg/dL

## 2017-03-21 MED ORDER — BASAGLAR KWIKPEN 100 UNIT/ML ~~LOC~~ SOPN: 75.0000 [IU] | PEN_INJECTOR | Freq: Every day | SUBCUTANEOUS | 11 refills | Status: DC

## 2017-03-21 NOTE — Patient Instructions (Signed)
We will check the labs today and adjust the insulins as needed.   We have given you the application to fill out for the transportation.

## 2017-03-21 NOTE — Assessment & Plan Note (Addendum)
With worsening neuropathy and taking gabapentin 300 mg TID. Suspect poor control today, not checking sugars. Taking humalog with meals sometimes and metformin. Will switch to basaglar to see if this is covered or lantus if this is not covered. She is reminded that she needs eye exam and that the longer her level of control is poor the more risk of side effects and more complications are present.

## 2017-03-21 NOTE — Assessment & Plan Note (Signed)
LDL goal <70 and last 195. She is not willing to take statins. Has zetia but is not taking it right now.

## 2017-03-21 NOTE — Progress Notes (Signed)
   Subjective:    Patient ID: [name], female    DOB: November 20, 1946, 71 y.o.   MRN: [id number]  HPI The patient is a 71 YO female coming in for follow up of her diabetes. She has previously been uncontrolled and at last visit had been out of her medicines for several weeks to months. She does have neuropathy which is still severe and keeps her from feeling her hands and feet. She is taking her metformin and the meal time insulin but the nph insulin was too expensive at $400 and she could not afford it. She has not taken this in months. Denies chest pains or SOB. Denies nausea or vomiting. Still having significant financial troubles which limit her access to care and to medications.   Review of Systems  Constitutional: Positive for activity change and appetite change. Negative for fatigue, fever and unexpected weight change.  Respiratory: Negative for cough, chest tightness, shortness of breath and wheezing.   Cardiovascular: Negative for chest pain, palpitations and leg swelling.  Gastrointestinal: Positive for diarrhea. Negative for abdominal distention, abdominal pain, constipation, nausea and vomiting.       Rare, stable  Musculoskeletal: Positive for arthralgias and gait problem. Negative for joint swelling, myalgias, neck pain and neck stiffness.  Skin: Negative.   Neurological: Positive for weakness and numbness. Negative for dizziness, facial asymmetry and headaches.      Objective:   Physical Exam  Constitutional: She is oriented to person, place, and time. She appears well-developed and well-nourished.  HENT:  Head: Normocephalic and atraumatic.  Eyes: EOM are normal.  Neck: Normal range of motion.  Cardiovascular: Normal rate and regular rhythm.   Pulmonary/Chest: Effort normal and breath sounds normal.  Abdominal: Soft. She exhibits no distension. There is no tenderness. There is no rebound.  Neurological: She is alert and oriented to person, place, and time.  Skin:  Skin is warm and dry.   Vitals:   03/21/17 1257  BP: 130/72  Pulse: 85  Resp: 14  Temp: 98.6 F (37 C)  TempSrc: Oral  SpO2: 97%  Weight: 196 lb (88.9 kg)  Height:  (1.626 m)      Assessment & Plan:

## 2017-03-21 NOTE — Progress Notes (Signed)
Pre visit review using our clinic review tool, if applicable. No additional management support is needed unless otherwise documented below in the visit note. 

## 2017-06-03 ENCOUNTER — Other Ambulatory Visit: Payer: Self-pay | Admitting: Internal Medicine

## 2017-06-10 ENCOUNTER — Other Ambulatory Visit: Payer: Self-pay | Admitting: Internal Medicine

## 2017-06-10 DIAGNOSIS — Z1231 Encounter for screening mammogram for malignant neoplasm of breast: Secondary | ICD-10-CM

## 2017-06-13 ENCOUNTER — Other Ambulatory Visit: Payer: Self-pay | Admitting: Internal Medicine

## 2017-06-13 DIAGNOSIS — E1165 Type 2 diabetes mellitus with hyperglycemia: Secondary | ICD-10-CM

## 2017-06-13 DIAGNOSIS — E1149 Type 2 diabetes mellitus with other diabetic neurological complication: Secondary | ICD-10-CM

## 2017-06-13 DIAGNOSIS — IMO0002 Reserved for concepts with insufficient information to code with codable children: Secondary | ICD-10-CM

## 2017-06-29 ENCOUNTER — Other Ambulatory Visit: Payer: Self-pay | Admitting: Internal Medicine

## 2017-06-29 DIAGNOSIS — E1165 Type 2 diabetes mellitus with hyperglycemia: Secondary | ICD-10-CM

## 2017-06-29 DIAGNOSIS — E1149 Type 2 diabetes mellitus with other diabetic neurological complication: Secondary | ICD-10-CM

## 2017-06-29 DIAGNOSIS — IMO0002 Reserved for concepts with insufficient information to code with codable children: Secondary | ICD-10-CM

## 2017-07-16 ENCOUNTER — Telehealth: Payer: Self-pay

## 2017-07-16 DIAGNOSIS — E1165 Type 2 diabetes mellitus with hyperglycemia: Secondary | ICD-10-CM

## 2017-07-16 DIAGNOSIS — Z794 Long term (current) use of insulin: Principal | ICD-10-CM

## 2017-07-16 DIAGNOSIS — E1142 Type 2 diabetes mellitus with diabetic polyneuropathy: Secondary | ICD-10-CM

## 2017-07-16 DIAGNOSIS — IMO0002 Reserved for concepts with insufficient information to code with codable children: Secondary | ICD-10-CM

## 2017-07-16 NOTE — Telephone Encounter (Signed)
PA started on CoverMyMeds KEY: [id number]

## 2017-07-18 NOTE — Telephone Encounter (Signed)
PA was denied. Patients LOV was 03/21/2017 and Dr. Okey Duprerawford stated that if this was denied they would try Lantus. Please advise per Dr. Lawana Chambersrawfords absence. thanks

## 2017-07-19 NOTE — Telephone Encounter (Signed)
Maintain current dose of NPH and novolog. Enter referral to endocrinology

## 2017-07-22 NOTE — Telephone Encounter (Signed)
Tried to call patient to let her know that her [name] kwikpen was denied and since crawford is gone we were sending a referral over to endocrinology, but mailbox was not set up for me to leave a message.

## 2017-07-29 ENCOUNTER — Other Ambulatory Visit: Payer: Self-pay | Admitting: Family

## 2017-07-29 DIAGNOSIS — E1149 Type 2 diabetes mellitus with other diabetic neurological complication: Secondary | ICD-10-CM

## 2017-07-29 DIAGNOSIS — IMO0002 Reserved for concepts with insufficient information to code with codable children: Secondary | ICD-10-CM

## 2017-07-29 DIAGNOSIS — E1165 Type 2 diabetes mellitus with hyperglycemia: Secondary | ICD-10-CM

## 2017-08-01 ENCOUNTER — Other Ambulatory Visit: Payer: Self-pay | Admitting: Family

## 2017-08-01 DIAGNOSIS — E1165 Type 2 diabetes mellitus with hyperglycemia: Secondary | ICD-10-CM

## 2017-08-01 DIAGNOSIS — E1149 Type 2 diabetes mellitus with other diabetic neurological complication: Secondary | ICD-10-CM

## 2017-08-01 DIAGNOSIS — IMO0002 Reserved for concepts with insufficient information to code with codable children: Secondary | ICD-10-CM

## 2017-08-06 ENCOUNTER — Ambulatory Visit
Admission: RE | Admit: 2017-08-06 | Discharge: 2017-08-06 | Disposition: A | Discharge status: Home / Self Care | Payer: Medicare Other | Source: Ambulatory Visit | Attending: Internal Medicine | Admitting: Internal Medicine

## 2017-08-06 DIAGNOSIS — Z1231 Encounter for screening mammogram for malignant neoplasm of breast: Secondary | ICD-10-CM

## 2017-08-29 ENCOUNTER — Other Ambulatory Visit: Payer: Self-pay | Admitting: Family

## 2017-08-29 DIAGNOSIS — E1149 Type 2 diabetes mellitus with other diabetic neurological complication: Secondary | ICD-10-CM

## 2017-08-29 DIAGNOSIS — E1165 Type 2 diabetes mellitus with hyperglycemia: Secondary | ICD-10-CM

## 2017-08-29 DIAGNOSIS — IMO0002 Reserved for concepts with insufficient information to code with codable children: Secondary | ICD-10-CM

## 2017-08-29 NOTE — Telephone Encounter (Signed)
EC-When would you like this patient to F/U? Last seen in April/refills have been requested/plz advise/thx dmf

## 2017-09-14 ENCOUNTER — Other Ambulatory Visit: Payer: Self-pay | Admitting: Internal Medicine

## 2017-09-24 ENCOUNTER — Ambulatory Visit: Payer: Medicare Other | Admitting: Endocrinology

## 2017-09-24 DIAGNOSIS — Z0289 Encounter for other administrative examinations: Secondary | ICD-10-CM

## 2017-09-27 ENCOUNTER — Other Ambulatory Visit: Payer: Self-pay | Admitting: Family

## 2017-10-01 ENCOUNTER — Other Ambulatory Visit: Payer: Self-pay | Admitting: Internal Medicine

## 2017-10-01 DIAGNOSIS — IMO0002 Reserved for concepts with insufficient information to code with codable children: Secondary | ICD-10-CM

## 2017-10-01 DIAGNOSIS — E1165 Type 2 diabetes mellitus with hyperglycemia: Secondary | ICD-10-CM

## 2017-10-01 DIAGNOSIS — E1149 Type 2 diabetes mellitus with other diabetic neurological complication: Secondary | ICD-10-CM

## 2017-10-20 ENCOUNTER — Ambulatory Visit: Payer: Medicare Other | Admitting: Internal Medicine

## 2017-10-21 ENCOUNTER — Ambulatory Visit: Payer: Medicare Other | Admitting: Internal Medicine

## 2017-10-21 ENCOUNTER — Encounter: Payer: Self-pay | Admitting: Internal Medicine

## 2017-10-21 ENCOUNTER — Other Ambulatory Visit (INDEPENDENT_AMBULATORY_CARE_PROVIDER_SITE_OTHER): Payer: Medicare Other

## 2017-10-21 VITALS — BP 132/68 | HR 72 | Temp 98.1°F | Ht 64.0 in | Wt 200.0 lb

## 2017-10-21 DIAGNOSIS — M25551 Pain in right hip: Secondary | ICD-10-CM | POA: Diagnosis not present

## 2017-10-21 DIAGNOSIS — M25562 Pain in left knee: Secondary | ICD-10-CM

## 2017-10-21 DIAGNOSIS — M25552 Pain in left hip: Secondary | ICD-10-CM | POA: Diagnosis not present

## 2017-10-21 DIAGNOSIS — E118 Type 2 diabetes mellitus with unspecified complications: Secondary | ICD-10-CM | POA: Diagnosis not present

## 2017-10-21 DIAGNOSIS — E1149 Type 2 diabetes mellitus with other diabetic neurological complication: Secondary | ICD-10-CM

## 2017-10-21 DIAGNOSIS — G8929 Other chronic pain: Secondary | ICD-10-CM | POA: Diagnosis not present

## 2017-10-21 DIAGNOSIS — Z23 Encounter for immunization: Secondary | ICD-10-CM

## 2017-10-21 DIAGNOSIS — M7062 Trochanteric bursitis, left hip: Secondary | ICD-10-CM | POA: Diagnosis not present

## 2017-10-21 DIAGNOSIS — IMO0002 Reserved for concepts with insufficient information to code with codable children: Secondary | ICD-10-CM

## 2017-10-21 DIAGNOSIS — E1165 Type 2 diabetes mellitus with hyperglycemia: Secondary | ICD-10-CM

## 2017-10-21 LAB — COMPREHENSIVE METABOLIC PANEL
ALT: 17 U/L (ref 0–35)
AST: 15 U/L (ref 0–37)
Albumin: 3.5 g/dL (ref 3.5–5.2)
Alkaline Phosphatase: 112 U/L (ref 39–117)
BUN: 16 mg/dL (ref 6–23)
CO2: 31 mEq/L (ref 19–32)
Calcium: 9.3 mg/dL (ref 8.4–10.5)
Chloride: 96 mEq/L (ref 96–112)
Creatinine, Ser: 0.61 mg/dL (ref 0.40–1.20)
GFR: 102.86 mL/min (ref >60.00)
Glucose, Bld: 330 mg/dL — ABNORMAL HIGH (ref 70–99)
Potassium: 4.8 mEq/L (ref 3.5–5.1)
Sodium: 132 mEq/L — ABNORMAL LOW (ref 135–145)
Total Bilirubin: 0.6 mg/dL (ref 0.2–1.2)
Total Protein: 7.2 g/dL (ref 6.0–8.3)

## 2017-10-21 LAB — HEMOGLOBIN A1C: Hgb A1c MFr Bld: 13.4 % — ABNORMAL HIGH (ref 4.6–6.5)

## 2017-10-21 MED ORDER — INSULIN NPH ISOPHANE & REGULAR (70-30) 100 UNIT/ML ~~LOC~~ SUSP: 30.0000 [IU] | Freq: Two times a day (BID) | SUBCUTANEOUS | 11 refills | Status: DC

## 2017-10-21 NOTE — Patient Instructions (Addendum)
We have sent in voltaren to the pharmacy which you can take 1 pill twice a day.  Try to use the exercises to help with pain. We are checking the x-ray today.   We have sent in a new insulin which is sometimes cheaper for people called 70/30. Take 30 units twice a day. You need to monitor the sugars with this medicine in the morning and also after a meal (about 30-60 minutes after eating) so that we can adjust the dose. Call us back with the sugar levels so we can adjust the dose. We are checking the labs today.    Trochanteric Bursitis Rehab Ask your health care provider which exercises are safe for you. Do exercises exactly as told by your health care provider and adjust them as directed. It is normal to feel mild stretching, pulling, tightness, or discomfort as you do these exercises, but you should stop right away if you feel sudden pain or your pain gets worse.Do not begin these exercises until told by your health care provider. Stretching exercises These exercises warm up your muscles and joints and improve the movement and flexibility of your hip. These exercises also help to relieve pain and stiffness. Exercise A: Iliotibial band stretch  1. Lie on your side with your left / right leg in the top position. 2. Bend your left / right knee and grab your ankle. 3. Slowly bring your knee back so your thigh is behind your body. 4. Slowly lower your knee toward the floor until you feel a gentle stretch on the outside of your left / right thigh. If you do not feel a stretch and your knee will not fall farther, place the heel of your other foot on top of your outer knee and pull your thigh down farther. 5. Hold this position for __________ seconds. 6. Slowly return to the starting position. Repeat __________ times. Complete this exercise __________ times a day. Strengthening exercises These exercises build strength and endurance in your hip and pelvis. Endurance is the ability to use your muscles  for a long time, even after they get tired. Exercise B: Bridge ( hip extensors) 1. Lie on your back on a firm surface with your knees bent and your feet flat on the floor. 2. Tighten your buttocks muscles and lift your buttocks off the floor until your trunk is level with your thighs. You should feel the muscles working in your buttocks and the back of your thighs. If this exercise is too easy, try doing it with your arms crossed over your chest. 3. Hold this position for __________ seconds. 4. Slowly return to the starting position. 5. Let your muscles relax completely between repetitions. Repeat __________ times. Complete this exercise __________ times a day. Exercise C: Squats ( knee extensors and  quadriceps) 1. Stand in front of a table, with your feet and knees pointing straight ahead. You may rest your hands on the table for balance but not for support. 2. Slowly bend your knees and lower your hips like you are going to sit in a chair. ? Keep your weight over your heels, not over your toes. ? Keep your lower legs upright so they are parallel with the table legs. ? Do not let your hips go lower than your knees. ? Do not bend lower than told by your health care provider. ? If your hip pain increases, do not bend as low. 3. Hold this position for __________ seconds. 4. Slowly push with your legs  to return to standing. Do not use your hands to pull yourself to standing. Repeat __________ times. Complete this exercise __________ times a day. Exercise D: Hip hike 1. Stand sideways on a bottom step. Stand on your left / right leg with your other foot unsupported next to the step. You can hold onto the railing or wall if needed for balance. 2. Keeping your knees straight and your torso square, lift your left / right hip up toward the ceiling. 3. Hold this position for __________ seconds. 4. Slowly let your left / right hip lower toward the floor, past the starting position. Your foot should  get closer to the floor. Do not lean or bend your knees. Repeat __________ times. Complete this exercise __________ times a day. Exercise E: Single leg stand 1. Stand near a counter or door frame that you can hold onto for balance as needed. It is helpful to stand in front of a mirror for this exercise so you can watch your hip. 2. Squeeze your left / right buttock muscles then lift up your other foot. Do not let your left / right hip push out to the side. 3. Hold this position for __________ seconds. Repeat __________ times. Complete this exercise __________ times a day. This information is not intended to replace advice given to you by your health care provider. Make sure you discuss any questions you have with your health care provider. Document Released: 01/02/2005 Document Revised: 08/01/2016 Document Reviewed: 11/10/2015 Elsevier Interactive Patient Education  [employer]2018 [employer].

## 2017-10-21 NOTE — Progress Notes (Signed)
   Subjective:    Patient ID: [name], female    DOB: Apr 11, 1946, 71 y.o.   MRN: [id number]  HPI The patient is a 71 YO female coming in for left leg pain in the whole leg. Some in the knees as well as the hips. The knee pain (sometimes giving out on her, denies injury or overuse recently, several falls in the last year but denies serious injury and did not seek medical care, going on for years, worsening in the last year or so, has not tried anything for it), and her left hip pain (hurts to lay on that side, denies numbness or weakness, hurts on the side of the hip and not in the middle, feels very sensitive to touch, has not tried anything for this, going on for about 1-2 weeks,worsening,7/10 pain), and her diabetes (she is not taking som eof the insulins due to cost, she has not had long acting for years although previously she has told us she is taking, taking insulin with meals and also at bedtime (same insulin)and denies low sugars, does not check sugars often but sometimes in the 200s when she checks,complicated by neuropathy which is gradually worsening with time)  Review of Systems  Constitutional: Positive for activity change and appetite change.  HENT: Negative.   Eyes: Negative.   Respiratory: Negative for cough, chest tightness and shortness of breath.   Cardiovascular: Negative for chest pain, palpitations and leg swelling.  Gastrointestinal: Positive for abdominal distention. Negative for abdominal pain, constipation, diarrhea, nausea and vomiting.  Musculoskeletal: Positive for arthralgias, gait problem and myalgias.  Skin: Negative.   Neurological: Positive for weakness and numbness.  Psychiatric/Behavioral: Negative.       Objective:   Physical Exam  Constitutional: She is oriented to person, place, and time. She appears well-developed and well-nourished.  HENT:  Head: Normocephalic and atraumatic.  Eyes: EOM are normal.  Neck: Normal range of motion.    Cardiovascular: Normal rate and regular rhythm.  Pulmonary/Chest: Effort normal and breath sounds normal. No respiratory distress. She has no wheezes. She has no rales.  Abdominal: Soft. She exhibits no distension. There is no tenderness. There is no rebound.  Musculoskeletal: She exhibits tenderness. She exhibits no edema.  Pain in the MCL left knee, ACL and PCL intact, left hip with trochanteric tenderness, no groin pain  Neurological: She is alert and oriented to person, place, and time. Coordination abnormal.  Skin: Skin is warm and dry.  Psychiatric: She has a normal mood and affect.   Vitals:   10/21/17 0942  BP: 132/68  Pulse: 72  Temp: 98.1 F (36.7 C)  TempSrc: Oral  SpO2: 99%  Weight: 200 lb (90.7 kg)  Height: 5\' 4"  (1.626 m)      Assessment & Plan:  Flu shot given at visit

## 2017-10-24 DIAGNOSIS — M7062 Trochanteric bursitis, left hip: Secondary | ICD-10-CM | POA: Insufficient documentation

## 2017-10-24 DIAGNOSIS — M25562 Pain in left knee: Secondary | ICD-10-CM | POA: Insufficient documentation

## 2017-10-24 NOTE — Assessment & Plan Note (Signed)
Suspect partial MCL tear with some decreased stability of the knee. Rx for voltaren for pain. Advised to use cane or walker for stability. Likely needs total knee replacement with ortho but she cannot afford to see them.

## 2017-10-24 NOTE — Assessment & Plan Note (Signed)
Checking HgA1c and change to novolog 70/30 for cost savings, 30 units BID and she knows that she needs to check sugars and return in about 1 month. Foot exam done today.

## 2017-10-24 NOTE — Assessment & Plan Note (Addendum)
Rx for voltaren for inflammation. Given her poorly controlled diabetes she cannot do steroid burst. Given stretching exercises. Recommended visit with sports med or ortho but she cannot afford this. X-ray hips to check for concurrent arthritis which could make this more likely to be recurrent.

## 2017-10-25 ENCOUNTER — Other Ambulatory Visit: Payer: Self-pay | Admitting: Family

## 2017-10-27 ENCOUNTER — Telehealth: Payer: Self-pay

## 2017-10-27 ENCOUNTER — Other Ambulatory Visit: Payer: Self-pay | Admitting: Internal Medicine

## 2017-10-27 MED ORDER — INSULIN LISPRO PROT & LISPRO (75-25 MIX) 100 UNIT/ML KWIKPEN: 30.0000 [IU] | PEN_INJECTOR | Freq: Two times a day (BID) | SUBCUTANEOUS | 6 refills | Status: DC

## 2017-10-27 NOTE — Telephone Encounter (Signed)
Received a fax from patients pharmacy stating that Humulin 70/30 is preferred by patients pharmacy as apposed to the Novolin 70/30 that was sent in and want to know if it is appropriate to switch.

## 2017-10-27 NOTE — Telephone Encounter (Signed)
Sent in humalog instead

## 2017-10-28 MED ORDER — EZETIMIBE 10 MG PO TABS: 10.0000 mg | ORAL_TABLET | Freq: Every day | ORAL | 3 refills | Status: DC

## 2017-11-01 ENCOUNTER — Other Ambulatory Visit: Payer: Self-pay | Admitting: Internal Medicine

## 2017-11-01 DIAGNOSIS — E1149 Type 2 diabetes mellitus with other diabetic neurological complication: Secondary | ICD-10-CM

## 2017-11-01 DIAGNOSIS — IMO0002 Reserved for concepts with insufficient information to code with codable children: Secondary | ICD-10-CM

## 2017-11-01 DIAGNOSIS — E1165 Type 2 diabetes mellitus with hyperglycemia: Secondary | ICD-10-CM

## 2017-11-03 NOTE — Telephone Encounter (Signed)
Pt had an acute appt on 10/21/17, both meds are due for a refill, please advise

## 2017-11-04 ENCOUNTER — Telehealth: Payer: Self-pay | Admitting: Internal Medicine

## 2017-11-04 NOTE — Telephone Encounter (Signed)
FYI: Calling in regard to issues she could to tell me about.  States the number she has provided can not be reached back on.  States is faxing over information on medication.

## 2017-11-14 ENCOUNTER — Telehealth: Payer: Self-pay | Admitting: Internal Medicine

## 2017-11-14 NOTE — Telephone Encounter (Signed)
Copied from CRM [id number]. Topic: Quick Communication - See Telephone Encounter >> Nov 14, 2017  9:39 AM Rudi CocoLathan, Latrease Kunde M, NT wrote: CRM for notification. See Telephone encounter for:   11/14/17. [facility]. Called and needs approval for pt. Med. To be dispense.   Ref. #[id number]

## 2017-11-14 NOTE — Telephone Encounter (Signed)
What Medication?

## 2017-11-17 ENCOUNTER — Telehealth: Payer: Self-pay | Admitting: Internal Medicine

## 2017-11-17 DIAGNOSIS — E1149 Type 2 diabetes mellitus with other diabetic neurological complication: Secondary | ICD-10-CM

## 2017-11-17 DIAGNOSIS — E1165 Type 2 diabetes mellitus with hyperglycemia: Secondary | ICD-10-CM

## 2017-11-17 DIAGNOSIS — IMO0002 Reserved for concepts with insufficient information to code with codable children: Secondary | ICD-10-CM

## 2017-11-17 NOTE — Telephone Encounter (Signed)
Copied from CRM [id number]. Topic: Quick Communication - See Telephone Encounter >> Nov 17, 2017 12:01 PM Eston Mouldavis, Paeton Studer B wrote: CRM for notification. See Telephone encounter for: [facility]  needs approval for 6 medications  call back [phone]  reference #[id number]  11/17/17.

## 2017-11-19 MED ORDER — FUROSEMIDE 20 MG PO TABS: 20.0000 mg | ORAL_TABLET | Freq: Every day | ORAL | 0 refills | Status: DC | PRN

## 2017-11-19 MED ORDER — CITALOPRAM HYDROBROMIDE 20 MG PO TABS: 20.0000 mg | ORAL_TABLET | Freq: Every day | ORAL | 1 refills | Status: DC

## 2017-11-19 MED ORDER — ESOMEPRAZOLE MAGNESIUM 20 MG PO CPDR: 20.0000 mg | DELAYED_RELEASE_CAPSULE | Freq: Every day | ORAL | 1 refills | Status: DC

## 2017-11-19 MED ORDER — LISINOPRIL 20 MG PO TABS: 20.0000 mg | ORAL_TABLET | Freq: Every day | ORAL | 1 refills | Status: DC

## 2017-11-19 MED ORDER — EZETIMIBE 10 MG PO TABS: 10.0000 mg | ORAL_TABLET | Freq: Every day | ORAL | 1 refills | Status: DC

## 2017-11-19 MED ORDER — GABAPENTIN 300 MG PO CAPS: 300.0000 mg | ORAL_CAPSULE | Freq: Three times a day (TID) | ORAL | 1 refills | Status: DC

## 2017-11-19 MED ORDER — METFORMIN HCL 1000 MG PO TABS: ORAL_TABLET | ORAL | 1 refills | Status: DC

## 2017-11-19 NOTE — Telephone Encounter (Signed)
Called [facility] back spoke w/ pharmacist Ashrick K gave approval for all maintenance meds...[name]/lmb

## 2017-12-12 ENCOUNTER — Other Ambulatory Visit: Payer: Self-pay | Admitting: Internal Medicine

## 2018-01-07 ENCOUNTER — Other Ambulatory Visit: Payer: Self-pay | Admitting: Internal Medicine

## 2018-01-29 ENCOUNTER — Other Ambulatory Visit: Payer: Self-pay | Admitting: Internal Medicine

## 2018-01-29 DIAGNOSIS — IMO0002 Reserved for concepts with insufficient information to code with codable children: Secondary | ICD-10-CM

## 2018-01-29 DIAGNOSIS — E1165 Type 2 diabetes mellitus with hyperglycemia: Secondary | ICD-10-CM

## 2018-01-29 DIAGNOSIS — E1149 Type 2 diabetes mellitus with other diabetic neurological complication: Secondary | ICD-10-CM

## 2018-06-08 ENCOUNTER — Other Ambulatory Visit: Payer: Self-pay | Admitting: Internal Medicine

## 2018-06-08 DIAGNOSIS — E1149 Type 2 diabetes mellitus with other diabetic neurological complication: Secondary | ICD-10-CM

## 2018-06-08 DIAGNOSIS — IMO0002 Reserved for concepts with insufficient information to code with codable children: Secondary | ICD-10-CM

## 2018-06-08 DIAGNOSIS — E1165 Type 2 diabetes mellitus with hyperglycemia: Secondary | ICD-10-CM

## 2018-07-31 DIAGNOSIS — H43812 Vitreous degeneration, left eye: Secondary | ICD-10-CM | POA: Diagnosis not present

## 2018-11-02 ENCOUNTER — Other Ambulatory Visit: Payer: Self-pay

## 2018-11-02 NOTE — Patient Outreach (Signed)
[facility] Care Management  11/02/2018  [name] 1946-04-06 [id number]   Medication Adherence call to [name] spoke with patient she already [facility] patient is due on Metformin 1000 and Lisinopril 20 mg,patient is only taking 1 & 1/2 tablet on Metformin because of side effects.[name] is showing past under [name]   Cynthia AbedAna [name] CPhT Pharmacy Technician [facility] Care Management Direct Dial [phone]  Fax [phone] [name]

## 2018-11-12 ENCOUNTER — Encounter: Payer: Self-pay | Admitting: Internal Medicine

## 2018-11-12 ENCOUNTER — Other Ambulatory Visit (INDEPENDENT_AMBULATORY_CARE_PROVIDER_SITE_OTHER): Payer: Medicare Other

## 2018-11-12 ENCOUNTER — Ambulatory Visit (INDEPENDENT_AMBULATORY_CARE_PROVIDER_SITE_OTHER): Payer: Medicare Other | Admitting: Internal Medicine

## 2018-11-12 VITALS — BP 122/70 | HR 82 | Temp 97.8°F | Ht 64.0 in | Wt 184.0 lb

## 2018-11-12 DIAGNOSIS — E1165 Type 2 diabetes mellitus with hyperglycemia: Secondary | ICD-10-CM

## 2018-11-12 DIAGNOSIS — E1149 Type 2 diabetes mellitus with other diabetic neurological complication: Secondary | ICD-10-CM | POA: Diagnosis not present

## 2018-11-12 DIAGNOSIS — K580 Irritable bowel syndrome with diarrhea: Secondary | ICD-10-CM

## 2018-11-12 DIAGNOSIS — Z0001 Encounter for general adult medical examination with abnormal findings: Secondary | ICD-10-CM

## 2018-11-12 DIAGNOSIS — R6889 Other general symptoms and signs: Secondary | ICD-10-CM | POA: Diagnosis not present

## 2018-11-12 DIAGNOSIS — E785 Hyperlipidemia, unspecified: Secondary | ICD-10-CM

## 2018-11-12 DIAGNOSIS — I10 Essential (primary) hypertension: Secondary | ICD-10-CM

## 2018-11-12 DIAGNOSIS — E1142 Type 2 diabetes mellitus with diabetic polyneuropathy: Secondary | ICD-10-CM

## 2018-11-12 DIAGNOSIS — E1169 Type 2 diabetes mellitus with other specified complication: Secondary | ICD-10-CM

## 2018-11-12 DIAGNOSIS — Z23 Encounter for immunization: Secondary | ICD-10-CM

## 2018-11-12 DIAGNOSIS — IMO0002 Reserved for concepts with insufficient information to code with codable children: Secondary | ICD-10-CM

## 2018-11-12 DIAGNOSIS — Z0000 Encounter for general adult medical examination without abnormal findings: Secondary | ICD-10-CM

## 2018-11-12 LAB — CBC
HCT: 42.5 % (ref 36.0–46.0)
Hemoglobin: 14.4 g/dL (ref 12.0–15.0)
MCHC: 33.7 g/dL (ref 30.0–36.0)
MCV: 82 fl (ref 78.0–100.0)
Platelets: 257 K/uL (ref 150.0–400.0)
RBC: 5.19 Mil/uL — ABNORMAL HIGH (ref 3.87–5.11)
RDW: 13.8 % (ref 11.5–15.5)
WBC: 8.8 K/uL (ref 4.0–10.5)

## 2018-11-12 LAB — COMPREHENSIVE METABOLIC PANEL WITH GFR
ALT: 21 U/L (ref 0–35)
AST: 15 U/L (ref 0–37)
Albumin: 3.9 g/dL (ref 3.5–5.2)
Alkaline Phosphatase: 113 U/L (ref 39–117)
BUN: 17 mg/dL (ref 6–23)
CO2: 30 meq/L (ref 19–32)
Calcium: 9.1 mg/dL (ref 8.4–10.5)
Chloride: 97 meq/L (ref 96–112)
Creatinine, Ser: 0.73 mg/dL (ref 0.40–1.20)
GFR: 83.36 mL/min (ref >60.00)
Glucose, Bld: 358 mg/dL — ABNORMAL HIGH (ref 70–99)
Potassium: 4 meq/L (ref 3.5–5.1)
Sodium: 133 meq/L — ABNORMAL LOW (ref 135–145)
Total Bilirubin: 0.4 mg/dL (ref 0.2–1.2)
Total Protein: 7.5 g/dL (ref 6.0–8.3)

## 2018-11-12 LAB — LIPID PANEL
Cholesterol: 178 mg/dL (ref 0–200)
HDL: 33.4 mg/dL — ABNORMAL LOW (ref >39.00)
NonHDL: 144.52
Total CHOL/HDL Ratio: 5
Triglycerides: 224 mg/dL — ABNORMAL HIGH (ref 0.0–149.0)
VLDL: 44.8 mg/dL — ABNORMAL HIGH (ref 0.0–40.0)

## 2018-11-12 LAB — POCT GLYCOSYLATED HEMOGLOBIN (HGB A1C): Hemoglobin A1C: 13.2 % — AB (ref 4.0–5.6)

## 2018-11-12 LAB — LDL CHOLESTEROL, DIRECT: Direct LDL: 110 mg/dL

## 2018-11-12 MED ORDER — DICYCLOMINE HCL 20 MG PO TABS: ORAL_TABLET | ORAL | 1 refills | Status: DC

## 2018-11-12 NOTE — Progress Notes (Signed)
   Subjective:    Patient ID: [name], female    DOB: [birth year], 72 y.o.   MRN: [id number]  HPI Here for medicare wellness and physical, no new complaints. Please see A/P for status and treatment of chronic medical problems.   HPI #2: Here for worsening neuropathy (in her hands and feet, taking gabapentin 300 mg TID which helps minimally, not good control of her diabetes right now), and her diabetes (severely uncontrolled, not taking any insulin right now, did not come back for follow up 1 year ago, is not checking sugars at all right now, does have severe neuropathy which is worsening, denies sores), and her ibs (more diarrhea in the last 6-12 months, denies medication changes, still taking metformin, has a lot of problems with food as she cannot afford much and has to rely on what others bring her even if it is unhealthy, denies blood in stool, denies weight loss).   Diet: DM since diabetic Physical activity: sedentary Depression/mood screen: see documentation Hearing: intact to whispered voice Visual acuity: grossly normal, overdue for annual eye exam  ADLs: capable Fall risk: low Home safety: good Cognitive evaluation: intact to orientation, naming, recall and repetition EOL planning: adv directives discussed  I have personally reviewed and have noted 1. The patient's medical and social history - reviewed today no changes 2. Their use of alcohol, tobacco or illicit drugs 3. Their current medications and supplements 4. The patient's functional ability including ADL's, fall risks, home safety risks and hearing or visual impairment. 5. Diet and physical activities 6. Evidence for depression or mood disorders 7. Care team reviewed and updated (available in snapshot)  Review of Systems  Constitutional: Negative.   HENT: Negative.   Eyes: Negative.   Respiratory: Negative for cough, chest tightness and shortness of breath.   Cardiovascular: Negative for chest pain,  palpitations and leg swelling.  Gastrointestinal: Positive for diarrhea. Negative for abdominal distention, abdominal pain, constipation, nausea and vomiting.  Genitourinary:       Incontinence  Musculoskeletal: Negative.   Skin: Negative.   Neurological: Positive for numbness.  Psychiatric/Behavioral: Negative.       Objective:   Physical Exam  Constitutional: She is oriented to person, place, and time. She appears well-developed and well-nourished.  HENT:  Head: Normocephalic and atraumatic.  Dentition poor  Eyes: EOM are normal.  Neck: Normal range of motion.  Cardiovascular: Normal rate and regular rhythm.  Pulmonary/Chest: Effort normal and breath sounds normal. No respiratory distress. She has no wheezes. She has no rales.  Abdominal: Soft. Bowel sounds are normal. She exhibits no distension. There is no tenderness. There is no rebound.  Musculoskeletal: She exhibits no edema.  Neurological: She is alert and oriented to person, place, and time. A cranial nerve deficit is present. Coordination normal.  Neuropathy to knees bilateral and hands  Skin: Skin is warm and dry.  See foot exam  Psychiatric: She has a normal mood and affect.   Vitals:   11/12/18 1258  BP: 122/70  Pulse: 82  Temp: 97.8 F (36.6 C)  TempSrc: Oral  SpO2: 98%  Weight: 184 lb (83.5 kg)  Height: 5\' 4"  (1.626 m)      Assessment & Plan:  Flu shot given at visit

## 2018-11-12 NOTE — Patient Instructions (Signed)
We are checking the labs today.   We have given you an application for free insulin possibly if you qualify.   You need to get the humalog and start taking 15 units twice a day. After 1 week if morning sugars are still >150 increase by 2 units every 3 days and increase to maximum of 30 units twice a day.   Come back in 1 month so we can adjust the sugars as your HgA1c is very high and this is causing harm to your health.   Health Maintenance, Female Adopting a healthy lifestyle and getting preventive care can go a long way to promote health and wellness. Talk with your health care provider about what schedule of regular examinations is right for you. This is a good chance for you to check in with your provider about disease prevention and staying healthy. In between checkups, there are plenty of things you can do on your own. Experts have done a lot of research about which lifestyle changes and preventive measures are most likely to keep you healthy. Ask your health care provider for more information. Weight and diet Eat a healthy diet  Be sure to include plenty of vegetables, fruits, low-fat dairy products, and lean protein.  Do not eat a lot of foods high in solid fats, added sugars, or salt.  Get regular exercise. This is one of the most important things you can do for your health. ? Most adults should exercise for at least 150 minutes each week. The exercise should increase your heart rate and make you sweat (moderate-intensity exercise). ? Most adults should also do strengthening exercises at least twice a week. This is in addition to the moderate-intensity exercise.  Maintain a healthy weight  Body mass index (BMI) is a measurement that can be used to identify possible weight problems. It estimates body fat based on height and weight. Your health care provider can help determine your BMI and help you achieve or maintain a healthy weight.  For females 58 years of age and older: ? A  BMI below 18.5 is considered underweight. ? A BMI of 18.5 to 24.9 is normal. ? A BMI of 25 to 29.9 is considered overweight. ? A BMI of 30 and above is considered obese.  Watch levels of cholesterol and blood lipids  You should start having your blood tested for lipids and cholesterol at 71 years of age, then have this test every 5 years.  You may need to have your cholesterol levels checked more often if: ? Your lipid or cholesterol levels are high. ? You are older than 71 years of age. ? You are at high risk for heart disease.  Cancer screening Lung Cancer  Lung cancer screening is recommended for adults 46-11 years old who are at high risk for lung cancer because of a history of smoking.  A yearly low-dose CT scan of the lungs is recommended for people who: ? Currently smoke. ? Have quit within the past 15 years. ? Have at least a 30-pack-year history of smoking. A pack year is smoking an average of one pack of cigarettes a day for 1 year.  Yearly screening should continue until it has been 15 years since you quit.  Yearly screening should stop if you develop a health problem that would prevent you from having lung cancer treatment.  Breast Cancer  Practice breast self-awareness. This means understanding how your breasts normally appear and feel.  It also means doing regular breast self-exams.  Let your health care provider know about any changes, no matter how small.  If you are in your 20s or 30s, you should have a clinical breast exam (CBE) by a health care provider every 1-3 years as part of a regular health exam.  If you are 52 or older, have a CBE every year. Also consider having a breast X-ray (mammogram) every year.  If you have a family history of breast cancer, talk to your health care provider about genetic screening.  If you are at high risk for breast cancer, talk to your health care provider about having an MRI and a mammogram every year.  Breast cancer gene  (BRCA) assessment is recommended for women who have family members with BRCA-related cancers. BRCA-related cancers include: ? Breast. ? Ovarian. ? Tubal. ? Peritoneal cancers.  Results of the assessment will determine the need for genetic counseling and BRCA1 and BRCA2 testing.  Cervical Cancer Your health care provider may recommend that you be screened regularly for cancer of the pelvic organs (ovaries, uterus, and vagina). This screening involves a pelvic examination, including checking for microscopic changes to the surface of your cervix (Pap test). You may be encouraged to have this screening done every 3 years, beginning at age 60.  For women ages 80-65, health care providers may recommend pelvic exams and Pap testing every 3 years, or they may recommend the Pap and pelvic exam, combined with testing for human papilloma virus (HPV), every 5 years. Some types of HPV increase your risk of cervical cancer. Testing for HPV may also be done on women of any age with unclear Pap test results.  Other health care providers may not recommend any screening for nonpregnant women who are considered low risk for pelvic cancer and who do not have symptoms. Ask your health care provider if a screening pelvic exam is right for you.  If you have had past treatment for cervical cancer or a condition that could lead to cancer, you need Pap tests and screening for cancer for at least 20 years after your treatment. If Pap tests have been discontinued, your risk factors (such as having a new sexual partner) need to be reassessed to determine if screening should resume. Some women have medical problems that increase the chance of getting cervical cancer. In these cases, your health care provider may recommend more frequent screening and Pap tests.  Colorectal Cancer  This type of cancer can be detected and often prevented.  Routine colorectal cancer screening usually begins at 71 years of age and continues  through 71 years of age.  Your health care provider may recommend screening at an earlier age if you have risk factors for colon cancer.  Your health care provider may also recommend using home test kits to check for hidden blood in the stool.  A small camera at the end of a tube can be used to examine your colon directly (sigmoidoscopy or colonoscopy). This is done to check for the earliest forms of colorectal cancer.  Routine screening usually begins at age 69.  Direct examination of the colon should be repeated every 5-10 years through 71 years of age. However, you may need to be screened more often if early forms of precancerous polyps or small growths are found.  Skin Cancer  Check your skin from head to toe regularly.  Tell your health care provider about any new moles or changes in moles, especially if there is a change in a mole's shape or color.  Also tell your health care provider if you have a mole that is larger than the size of a pencil eraser.  Always use sunscreen. Apply sunscreen liberally and repeatedly throughout the day.  Protect yourself by wearing long sleeves, pants, a wide-brimmed hat, and sunglasses whenever you are outside.  Heart disease, diabetes, and high blood pressure  High blood pressure causes heart disease and increases the risk of stroke. High blood pressure is more likely to develop in: ? People who have blood pressure in the high end of the normal range (130-139/85-89 mm Hg). ? People who are overweight or obese. ? People who are African American.  If you are 65-75 years of age, have your blood pressure checked every 3-5 years. If you are 8 years of age or older, have your blood pressure checked every year. You should have your blood pressure measured twice-once when you are at a hospital or clinic, and once when you are not at a hospital or clinic. Record the average of the two measurements. To check your blood pressure when you are not at a  hospital or clinic, you can use: ? An automated blood pressure machine at a pharmacy. ? A home blood pressure monitor.  If you are between 76 years and 50 years old, ask your health care provider if you should take aspirin to prevent strokes.  Have regular diabetes screenings. This involves taking a blood sample to check your fasting blood sugar level. ? If you are at a normal weight and have a low risk for diabetes, have this test once every three years after 71 years of age. ? If you are overweight and have a high risk for diabetes, consider being tested at a younger age or more often. Preventing infection Hepatitis B  If you have a higher risk for hepatitis B, you should be screened for this virus. You are considered at high risk for hepatitis B if: ? You were born in a country where hepatitis B is common. Ask your health care provider which countries are considered high risk. ? Your parents were born in a high-risk country, and you have not been immunized against hepatitis B (hepatitis B vaccine). ? You have HIV or AIDS. ? You use needles to inject street drugs. ? You live with someone who has hepatitis B. ? You have had sex with someone who has hepatitis B. ? You get hemodialysis treatment. ? You take certain medicines for conditions, including cancer, organ transplantation, and autoimmune conditions.  Hepatitis C  Blood testing is recommended for: ? Everyone born from [birth year] through 1964. ? Anyone with known risk factors for hepatitis C.  Sexually transmitted infections (STIs)  You should be screened for sexually transmitted infections (STIs) including gonorrhea and chlamydia if: ? You are sexually active and are younger than 71 years of age. ? You are older than 71 years of age and your health care provider tells you that you are at risk for this type of infection. ? Your sexual activity has changed since you were last screened and you are at an increased risk for chlamydia or  gonorrhea. Ask your health care provider if you are at risk.  If you do not have HIV, but are at risk, it may be recommended that you take a prescription medicine daily to prevent HIV infection. This is called pre-exposure prophylaxis (PrEP). You are considered at risk if: ? You are sexually active and do not regularly use condoms or know the HIV status of  your partner(s). ? You take drugs by injection. ? You are sexually active with a partner who has HIV.  Talk with your health care provider about whether you are at high risk of being infected with HIV. If you choose to begin PrEP, you should first be tested for HIV. You should then be tested every 3 months for as long as you are taking PrEP. Pregnancy  If you are premenopausal and you may become pregnant, ask your health care provider about preconception counseling.  If you may become pregnant, take 400 to 800 micrograms (mcg) of folic acid every day.  If you want to prevent pregnancy, talk to your health care provider about birth control (contraception). Osteoporosis and menopause  Osteoporosis is a disease in which the bones lose minerals and strength with aging. This can result in serious bone fractures. Your risk for osteoporosis can be identified using a bone density scan.  If you are 13 years of age or older, or if you are at risk for osteoporosis and fractures, ask your health care provider if you should be screened.  Ask your health care provider whether you should take a calcium or vitamin D supplement to lower your risk for osteoporosis.  Menopause may have certain physical symptoms and risks.  Hormone replacement therapy may reduce some of these symptoms and risks. Talk to your health care provider about whether hormone replacement therapy is right for you. Follow these instructions at home:  Schedule regular health, dental, and eye exams.  Stay current with your immunizations.  Do not use any tobacco products including  cigarettes, chewing tobacco, or electronic cigarettes.  If you are pregnant, do not drink alcohol.  If you are breastfeeding, limit how much and how often you drink alcohol.  Limit alcohol intake to no more than 1 drink per day for nonpregnant women. One drink equals 12 ounces of beer, 5 ounces of wine, or 1 ounces of hard liquor.  Do not use street drugs.  Do not share needles.  Ask your health care provider for help if you need support or information about quitting drugs.  Tell your health care provider if you often feel depressed.  Tell your health care provider if you have ever been abused or do not feel safe at home. This information is not intended to replace advice given to you by your health care provider. Make sure you discuss any questions you have with your health care provider. Document Released: 06/10/2011 Document Revised: 05/02/2016 Document Reviewed: 08/29/2015 Elsevier Interactive Patient Education  [employer].

## 2018-11-13 ENCOUNTER — Encounter: Payer: Self-pay | Admitting: Internal Medicine

## 2018-11-13 DIAGNOSIS — E1140 Type 2 diabetes mellitus with diabetic neuropathy, unspecified: Secondary | ICD-10-CM | POA: Insufficient documentation

## 2018-11-13 NOTE — Assessment & Plan Note (Signed)
Taking gabapentin 300 mg TID which is not helping symptoms much. She is not able to afford lyrica to try. She is taking celexa which we tried to help boost this but she has not gotten any relief from this. We talked about how good control of her sugars will help more than medication and when HgA1c is >8 she will have worsening which may or may not be permanent.

## 2018-11-13 NOTE — Assessment & Plan Note (Signed)
Checking lipid panel and adjust as needed for LDL <100. Taking zetia.

## 2018-11-13 NOTE — Assessment & Plan Note (Signed)
POC HgA1c done in office which was 13.2 which is stable from before. She has been off insulin for almost 1 year due to finances. She also cannot get this through mail order. Have printed and helped patient understand how to fill out patient assistance form to help her get humalog 75/25 to start with 14 units BID and then increase until max of 30 units BID or until goal morning sugar. Needs follow up in 1 month to help adjust regimen. Asked to start checking sugar and she admits to having a sugar monitor at home.

## 2018-11-13 NOTE — Assessment & Plan Note (Signed)
BP at goal on her lasix and lisinopril. Checking CMP and adjust as needed.

## 2018-11-13 NOTE — Assessment & Plan Note (Signed)
Flu shot given at visit. Pneumonia up to date. Shingrix counseled declined. Tetanus up to date. Colonoscopy up to date. Mammogram up to date, pap smear aged out and dexa up to date. Counseled about sun safety and mole surveillance. Counseled about the dangers of distracted driving. Given 10 year screening recommendations.

## 2018-11-13 NOTE — Assessment & Plan Note (Signed)
With diarrhea which is worsening. We talked about possibility that metformin is causing some diarrhea but since she cannot afford other options for diabetes this is not feasible to stop at this time.

## 2018-11-16 ENCOUNTER — Other Ambulatory Visit: Payer: Self-pay | Admitting: Internal Medicine

## 2018-11-16 NOTE — Telephone Encounter (Signed)
At request of NT called pt back and let her know that she needs to call her pharmacy for these medications even though she was just seen by PCP for med refill visit.

## 2018-11-16 NOTE — Telephone Encounter (Signed)
Copied from [id number] [id number]. Topic: Quick Communication - Rx Refill/Question >> Nov 16, 2018  4:46 PM Lyn HollingsheadAlexander, [facility] wrote: Medication:  Pt states that she had an appointment and provider was supposed to reorder all of her medication.  Pt is still waiting on: ezetimibe (ZETIA) 10 MG tablet lisinopril (PRINIVIL,ZESTRIL) 20 MG tablet furosemide (LASIX) 20 MG tablet  citalopram (CELEXA) 20 MG tablet esomeprazole (NEXIUM) 20 MG capsule gabapentin (NEURONTIN) 300 MG capsule - wanted this medication dose increased To be RXd from Mail order   AND Insulin Lispro Prot & Lispro (HUMALOG MIX 75/25 KWIKPEN) (75-25) 100 UNIT/ML Kwikpen - [facility] instead of mail order because she can get it cheaper there.  Mail order medications Preferred Pharmacy (with phone number or street name): [facility] [place], [place] - [id number] [phone] (Phone) [phone] (Fax)  Local prescription [facility] pharmacy [facility] [address], [place] - [id number] [phone] (Phone) [phone] (Fax)  Agent: Please be advised that RX refills may take up to 3 business days. We ask that you follow-up with your pharmacy.

## 2018-11-17 NOTE — Telephone Encounter (Addendum)
Patient requesting Gabapentin dose to be increased and [facility].  Patient requesting Humalog sent to local pharmacy, [facility]; it's cheaper for her.

## 2018-11-18 ENCOUNTER — Other Ambulatory Visit: Payer: Self-pay | Admitting: *Deleted

## 2018-11-18 ENCOUNTER — Telehealth: Payer: Self-pay | Admitting: *Deleted

## 2018-11-18 MED ORDER — LISINOPRIL 20 MG PO TABS: 20.0000 mg | ORAL_TABLET | Freq: Every day | ORAL | 1 refills | Status: DC

## 2018-11-18 MED ORDER — INSULIN LISPRO PROT & LISPRO (75-25 MIX) 100 UNIT/ML KWIKPEN: 30.0000 [IU] | PEN_INJECTOR | Freq: Two times a day (BID) | SUBCUTANEOUS | 6 refills | Status: DC

## 2018-11-18 MED ORDER — CITALOPRAM HYDROBROMIDE 20 MG PO TABS: 20.0000 mg | ORAL_TABLET | Freq: Every day | ORAL | 1 refills | Status: DC

## 2018-11-18 MED ORDER — ESOMEPRAZOLE MAGNESIUM 20 MG PO CPDR: 20.0000 mg | DELAYED_RELEASE_CAPSULE | Freq: Every day | ORAL | 3 refills | Status: DC

## 2018-11-18 MED ORDER — FUROSEMIDE 20 MG PO TABS: ORAL_TABLET | ORAL | 1 refills | Status: DC

## 2018-11-18 MED ORDER — EZETIMIBE 10 MG PO TABS: 10.0000 mg | ORAL_TABLET | Freq: Every day | ORAL | 1 refills | Status: DC

## 2018-11-18 NOTE — Telephone Encounter (Signed)
Cholesterol labs have been reviewed by provider and patient is to continue medication.  Requested Prescriptions  Pending Prescriptions Disp Refills  . ezetimibe (ZETIA) 10 MG tablet 90 tablet 1    Sig: Take 1 tablet (10 mg total) by mouth daily.     Cardiovascular:  Antilipid - Sterol Transport Inhibitors Failed - 11/17/2018  4:58 PM      Failed - LDL in normal range and within 360 days    LDL Cholesterol  Date Value Ref Range Status  10/22/2013 139 (H) 0 - 99 mg/dL Final    Comment:      Total Cholesterol/HDL Ratio:CHD Risk                        Coronary Heart Disease Risk Table                                        Men       Women          1/2 Average Risk              3.4        3.3              Average Risk              5.0        4.4           2X Average Risk              9.6        7.1           3X Average Risk             23.4       11.0 Use the calculated Patient Ratio above and the CHD Risk table  to determine the patient's CHD Risk. ATP III Classification (LDL):       < 100        mg/dL         Optimal      409 - 129     mg/dL         Near or Above Optimal      130 - 159     mg/dL         Borderline High      160 - 189     mg/dL         High       > 811        mg/dL         Very High           Failed - HDL in normal range and within 360 days    HDL  Date Value Ref Range Status  11/12/2018 33.40 (L) >39.00 mg/dL Final         Failed - Triglycerides in normal range and within 360 days    Triglycerides  Date Value Ref Range Status  11/12/2018 224.0 (H) 0.0 - 149.0 mg/dL Final    Comment:    Normal:  <150 mg/dLBorderline High:  150 - 199 mg/dL         Passed - Total Cholesterol in normal range and within 360 days    Cholesterol  Date Value Ref Range Status  11/12/2018 178 0 - 200 mg/dL Final    Comment:  ATP III Classification       Desirable:  < 200 mg/dL               Borderline High:  200 - 239 mg/dL          High:  > = 161240 mg/dL         Passed -  Valid encounter within last 12 months    Recent Outpatient Visits          6 days ago Preventative health care   [facility] Crawford, [name], MD   1 year ago Uncontrolled diabetes mellitus with complications [facility](HCC)   [facility] Crawford, [name], MD   1 year ago Uncontrolled type 2 diabetes mellitus with diabetic polyneuropathy, with long-term current use of insulin (HCC)   [facility] Crawford, [name], MD   2 years ago Need for prophylactic vaccination against Streptococcus pneumoniae (pneumococcus)   [facility] Crawford, [name], MD   3 years ago Uncontrolled type 2 diabetes with neuropathy [facility](HCC)   [facility], [name], MD      Future Appointments            In 3 weeks Okey Duprerawford, Austin MilesElizabeth A, MD [facility] [place] [facility]         . lisinopril (PRINIVIL,ZESTRIL) 20 MG tablet 90 tablet 1    Sig: Take 1 tablet (20 mg total) by mouth daily.     Cardiovascular:  ACE Inhibitors Passed - 11/17/2018  4:58 PM      Passed - Cr in normal range and within 180 days    Creat  Date Value Ref Range Status  02/09/2013 0.63 0.50 - 1.10 mg/dL Final   Creatinine, Ser  Date Value Ref Range Status  11/12/2018 0.73 0.40 - 1.20 mg/dL Final         Passed - K in normal range and within 180 days    Potassium  Date Value Ref Range Status  11/12/2018 4.0 3.5 - 5.1 mEq/L Final         Passed - Patient is not pregnant      Passed - Last BP in normal range    BP Readings from Last 1 Encounters:  11/12/18 122/70         Passed - Valid encounter within last 6 months    Recent Outpatient Visits          6 days ago Preventative health care   [facility], [name], MD   1 year ago Uncontrolled diabetes mellitus with complications [facility](HCC)   [facility]  Crawford, [name], MD   1 year ago Uncontrolled type 2 diabetes mellitus with diabetic polyneuropathy, with long-term current use of insulin (HCC)   [facility] Crawford, [name], MD   2 years ago Need for prophylactic vaccination against Streptococcus pneumoniae (pneumococcus)   [facility] Crawford, [name], MD   3 years ago Uncontrolled type 2 diabetes with neuropathy [facility](HCC)   [facility], [name], MD      Future Appointments            In 3 weeks Okey Duprerawford, Austin MilesElizabeth A, MD  [facility], [facility]         . furosemide (LASIX) 20 MG tablet 90 tablet 1    Sig:  TAKE 1 TABLET BY MOUTH  DAILY AS NEEDED FOR FLUID  OR EDEMA     Cardiovascular:  Diuretics - Loop Failed - 11/17/2018  4:58 PM      Failed - Na in normal range and within 360 days    Sodium  Date Value Ref Range Status  11/12/2018 133 (L) 135 - 145 mEq/L Final         Passed - K in normal range and within 360 days    Potassium  Date Value Ref Range Status  11/12/2018 4.0 3.5 - 5.1 mEq/L Final         Passed - Ca in normal range and within 360 days    Calcium  Date Value Ref Range Status  11/12/2018 9.1 8.4 - 10.5 mg/dL Final         Passed - Cr in normal range and within 360 days    Creat  Date Value Ref Range Status  02/09/2013 0.63 0.50 - 1.10 mg/dL Final   Creatinine, Ser  Date Value Ref Range Status  11/12/2018 0.73 0.40 - 1.20 mg/dL Final         Passed - Last BP in normal range    BP Readings from Last 1 Encounters:  11/12/18 122/70         Passed - Valid encounter within last 6 months    Recent Outpatient Visits          6 days ago Preventative health care   [facility], [facility]   1 year ago Uncontrolled diabetes mellitus with complications [facility])   [facility], [facility]   1 year ago  Uncontrolled type 2 diabetes mellitus with diabetic polyneuropathy, with long-term current use of insulin (HCC)   [facility], [facility]   2 years ago Need for prophylactic vaccination against Streptococcus pneumoniae (pneumococcus)   [facility], [facility]   3 years ago Uncontrolled type 2 diabetes with neuropathy [facility])   [facility] -[place], [place]      Future Appointments            In 3 weeks Myrlene Broker, MD [facility], [facility]         . citalopram (CELEXA) 20 MG tablet 90 tablet 1    Sig: Take 1 tablet (20 mg total) by mouth daily.     Psychiatry:  Antidepressants - SSRI Passed - 11/17/2018  4:58 PM      Passed - Valid encounter within last 6 months    Recent Outpatient Visits          6 days ago Preventative health care   [facility] -[place], [place]   1 year ago Uncontrolled diabetes mellitus with complications [facility])   [facility], [facility]   1 year ago Uncontrolled type 2 diabetes mellitus with diabetic polyneuropathy, with long-term current use of insulin (HCC)   [facility] -[place], [facility]   2 years ago Need for prophylactic vaccination against Streptococcus pneumoniae (pneumococcus)   [facility] -[place], [facility]   3 years ago Uncontrolled type 2 diabetes with neuropathy [facility])   [facility] -[place], [facility]      Future Appointments  In 3 weeks Myrlene Broker, MD [facility] -[place], [facility]         . esomeprazole (NEXIUM) 20 MG capsule 90 capsule 3    Sig: Take 1 capsule (20 mg total) by mouth daily at 12 noon.     Gastroenterology: Proton Pump Inhibitors Passed - 11/17/2018  4:58 PM      Passed -  Valid encounter within last 12 months    Recent Outpatient Visits          6 days ago Preventative health care   [facility] -[place], [facility]   1 year ago Uncontrolled diabetes mellitus with complications [facility])   [facility] -[place], [facility]   1 year ago Uncontrolled type 2 diabetes mellitus with diabetic polyneuropathy, with long-term current use of insulin (HCC)   [facility] -[place], [facility]   2 years ago Need for prophylactic vaccination against Streptococcus pneumoniae (pneumococcus)   [facility] -[place], [place]   3 years ago Uncontrolled type 2 diabetes with neuropathy [facility])   [facility] -[place], [place]      Future Appointments            In 3 weeks Okey Dupre Austin Miles, MD [facility] -[place], [facility]

## 2018-11-18 NOTE — Telephone Encounter (Signed)
Lab protocol failed- provider is aware- OV 11/13/18 - patient has not been on medication.  Requested Prescriptions  Pending Prescriptions Disp Refills  . Insulin Lispro Prot & Lispro (HUMALOG MIX 75/25 KWIKPEN) (75-25) 100 UNIT/ML Kwikpen 30 mL 6    Sig: Inject 30 Units into the skin 2 (two) times daily.     Endocrinology:  Diabetes - Insulins Failed - 11/18/2018 12:19 PM      Failed - HBA1C is between 0 and 7.9 and within 180 days    Hemoglobin A1C  Date Value Ref Range Status  11/12/2018 13.2 (A) 4.0 - 5.6 % Final   Hgb A1c MFr Bld  Date Value Ref Range Status  10/21/2017 13.4 (H) 4.6 - 6.5 % Final    Comment:    Glycemic Control Guidelines for People with Diabetes:Non Diabetic:  <6%Goal of Therapy: <7%Additional Action Suggested:  >8%          Passed - Valid encounter within last 6 months    Recent Outpatient Visits          6 days ago Preventative health care   [facility], [name], MD   1 year ago Uncontrolled diabetes mellitus with complications [facility](HCC)   [facility] Crawford, [name], MD   1 year ago Uncontrolled type 2 diabetes mellitus with diabetic polyneuropathy, with long-term current use of insulin (HCC)   [facility] Crawford, [name], MD   2 years ago Need for prophylactic vaccination against Streptococcus pneumoniae (pneumococcus)   [facility] Crawford, [name], MD   3 years ago Uncontrolled type 2 diabetes with neuropathy [facility](HCC)   [facility] Crawford, [name], MD      Future Appointments            In 3 weeks Okey Duprerawford, Austin MilesElizabeth A, MD [facility], [facility]

## 2018-11-18 NOTE — Telephone Encounter (Signed)
In review of refill request- the medications that were requested to be sent to [facility] were approved- patient had made mention in the note [facility].  Patient had questioned per her visit if her Gabapentin was going to be increased- that will need to be reviewed by provider and [facility]

## 2018-11-18 NOTE — Telephone Encounter (Signed)
I have been working on refills for [name]- in the request note- she states she was expecting a change in her Neurontin dosing- I wanted to send that request for review before that was filled. She does want that sent to [facility] when the dose is decided. I think everything else she requested has been taken care of correctly.

## 2018-11-18 NOTE — Telephone Encounter (Signed)
Where you changing the dosing in patients gabapentin?

## 2018-11-19 ENCOUNTER — Other Ambulatory Visit: Payer: Self-pay | Admitting: Internal Medicine

## 2018-11-19 DIAGNOSIS — E1165 Type 2 diabetes mellitus with hyperglycemia: Secondary | ICD-10-CM

## 2018-11-19 DIAGNOSIS — IMO0002 Reserved for concepts with insufficient information to code with codable children: Secondary | ICD-10-CM

## 2018-11-19 DIAGNOSIS — E1149 Type 2 diabetes mellitus with other diabetic neurological complication: Secondary | ICD-10-CM

## 2018-11-19 NOTE — Telephone Encounter (Signed)
Tried calling patient but phone kept getting cut off. [facility] inc[facility] calls back

## 2018-11-19 NOTE — Telephone Encounter (Signed)
Noted Rx for Neurontin 300 mg was phoned in to [facility] at 11:22 today, per RMA in the office.

## 2018-11-19 NOTE — Telephone Encounter (Signed)
Would you like patient to schedule an appointment to discuss? 

## 2018-11-19 NOTE — Telephone Encounter (Signed)
Copied from CRM [id number]. Topic: Quick Communication - Rx Refill/Question >> Nov 19, 2018 10:58 AM [name], Cynthia R wrote: Medication: gabapentin (NEURONTIN) 300 MG capsule  Has the patient contacted their pharmacy? Yes  Preferred Pharm[facility] (with phone number or street name[facility] [place] [id number] [phone] (Phone) [phone] (Fax)    Agent: Please be advised that RX refills may take up to 3 business days. We ask that you follow-up with your pharmacy.

## 2018-11-19 NOTE — Telephone Encounter (Signed)
She should be fine to stay on current dosing. I would not recommend increase. We need to work on getting sugars down to help with her nerve pain.

## 2018-11-19 NOTE — Telephone Encounter (Signed)
Noted that Neurontin 300 mg Rx was phoned in to [facility] this morning, by RMA in office.  Phone call to pt. at this time, and informed that Dr. Okey Duprerawford wants her to continue the current dose, and that the prescription has been called into [facility].  Pt. stated she did receive a phone call from another nurse, earlier today, with this information.

## 2018-11-19 NOTE — Telephone Encounter (Signed)
We did not talk about changing dose of gabapentin at our visit.

## 2018-12-04 MED ORDER — GABAPENTIN 300 MG PO CAPS: 300.0000 mg | ORAL_CAPSULE | Freq: Three times a day (TID) | ORAL | 1 refills | Status: DC

## 2018-12-04 NOTE — Telephone Encounter (Signed)
[facility]

## 2018-12-04 NOTE — Telephone Encounter (Signed)
[facility] they have no verbal orders for gabapentin

## 2018-12-15 ENCOUNTER — Ambulatory Visit (INDEPENDENT_AMBULATORY_CARE_PROVIDER_SITE_OTHER): Payer: Medicare Other | Admitting: Internal Medicine

## 2018-12-15 ENCOUNTER — Encounter: Payer: Self-pay | Admitting: Internal Medicine

## 2018-12-15 DIAGNOSIS — R6889 Other general symptoms and signs: Secondary | ICD-10-CM | POA: Diagnosis not present

## 2018-12-15 DIAGNOSIS — IMO0002 Reserved for concepts with insufficient information to code with codable children: Secondary | ICD-10-CM

## 2018-12-15 DIAGNOSIS — E1165 Type 2 diabetes mellitus with hyperglycemia: Secondary | ICD-10-CM

## 2018-12-15 DIAGNOSIS — E1149 Type 2 diabetes mellitus with other diabetic neurological complication: Secondary | ICD-10-CM | POA: Diagnosis not present

## 2018-12-15 NOTE — Patient Instructions (Signed)
We will increase the insulin to 22 units twice a day and check the sugar in the morning sometimes.  Call us back in 2 weeks to let us know how you are doing.

## 2018-12-15 NOTE — Progress Notes (Signed)
   Subjective:   Patient ID: [name], female    DOB: [birth year], 73 y.o.   MRN: [id number]  HPI The patient is a 73 YO female coming in for follow up of her diabetes. She did not bring a log of sugars today as requested. She is taking insulin 20 units BID and metformin for sugars. Has neuropathy which is worsening in last several months. Last HgA1c 13.4 about 1 month ago. She is not checking sugars due to the pain of neuropathy in her hands making it unbearable to check sugars. She does have some symptoms of low sugars about 1 time per week and drinks some milk to help. Denies headaches. Denies chest pains. Does have neuropathy and it is climbing higher on her legs and hands hurting worse. Gabapentin takes the edge off but helps more with legs than arms.   Review of Systems  Constitutional: Positive for activity change and fatigue.  HENT: Negative.   Eyes: Negative.   Respiratory: Negative for cough, chest tightness and shortness of breath.   Cardiovascular: Negative for chest pain, palpitations and leg swelling.  Gastrointestinal: Negative for abdominal distention, abdominal pain, constipation, diarrhea, nausea and vomiting.  Musculoskeletal: Positive for arthralgias and myalgias.  Skin: Negative.   Neurological: Positive for numbness. Negative for dizziness, light-headedness and headaches.  Psychiatric/Behavioral: Negative.     Objective:  Physical Exam Constitutional:      Appearance: She is well-developed. She is obese.  HENT:     Head: Normocephalic and atraumatic.  Neck:     Musculoskeletal: Normal range of motion.  Cardiovascular:     Rate and Rhythm: Normal rate and regular rhythm.  Pulmonary:     Effort: Pulmonary effort is normal. No respiratory distress.     Breath sounds: Normal breath sounds. No wheezing or rales.  Abdominal:     General: Bowel sounds are normal. There is no distension.     Palpations: Abdomen is soft.     Tenderness: There is no  abdominal tenderness. There is no rebound.  Skin:    General: Skin is warm and dry.  Neurological:     Mental Status: She is alert and oriented to person, place, and time.     Cranial Nerves: Cranial nerve deficit present.     Coordination: Coordination normal.     Vitals:   12/15/18 1305  BP: 104/74  Pulse: 74  Temp: (!) 97.4 F (36.3 C)  TempSrc: Oral  SpO2: 96%  Weight: 183 lb (83 kg)  Height: 5\' 4"  (1.626 m)    Assessment & Plan:  Visit time 25 minutes: greater than 50% of that time was spent in face to face counseling and coordination of care with the patient: counseled about the likely serious complications caused by diabetes and the serious nature of her high blood sugars as well as the importance of blood sugar monitoring to help [place] make informed decisions about her health so she can have improvement in her health and talk about the barriers which are holding her back from taking care of her health.

## 2018-12-18 NOTE — Assessment & Plan Note (Signed)
She does have worsening neuropathy and has had consistent severe exacerbation of her diabetes. We talked about how long her sugars have been high and how this puts her in high risk of major complications including limb amputation, CV disease, worsening neuropathy, blindness. She states she has been to eye doctor and we will try to obtain records. Increase her humalog mix to 25 units BID and then in 1-2 weeks increase to 30 units BID. She is encouraged to check at least morning sugars so we can adjust regimen and spent a significant amount of time explaining why we need sugar data to help [place] make educated decisions about her medications to get her feeling better.

## 2018-12-22 ENCOUNTER — Other Ambulatory Visit: Payer: Self-pay | Admitting: Internal Medicine

## 2018-12-22 DIAGNOSIS — Z1231 Encounter for screening mammogram for malignant neoplasm of breast: Secondary | ICD-10-CM

## 2019-01-25 ENCOUNTER — Ambulatory Visit
Admission: RE | Admit: 2019-01-25 | Discharge: 2019-01-25 | Disposition: A | Discharge status: Home / Self Care | Payer: Medicare Other | Source: Ambulatory Visit | Attending: Internal Medicine | Admitting: Internal Medicine

## 2019-01-25 DIAGNOSIS — Z1231 Encounter for screening mammogram for malignant neoplasm of breast: Secondary | ICD-10-CM

## 2019-04-05 ENCOUNTER — Telehealth: Payer: Self-pay | Admitting: Internal Medicine

## 2019-04-05 MED ORDER — LISINOPRIL 20 MG PO TABS: 20.0000 mg | ORAL_TABLET | Freq: Every day | ORAL | 0 refills | Status: DC

## 2019-04-05 MED ORDER — FUROSEMIDE 20 MG PO TABS: ORAL_TABLET | ORAL | 0 refills | Status: DC

## 2019-04-07 NOTE — Telephone Encounter (Signed)
Patient states this medication was supposed to go to  [facility] [place] [place] - [address] [phone] (Phone) [phone] (Fax)

## 2019-04-08 MED ORDER — LISINOPRIL 20 MG PO TABS: 20.0000 mg | ORAL_TABLET | Freq: Every day | ORAL | 0 refills | Status: DC

## 2019-04-08 MED ORDER — FUROSEMIDE 20 MG PO TABS: ORAL_TABLET | ORAL | 0 refills | Status: DC

## 2019-04-08 NOTE — Telephone Encounter (Signed)
[facility]

## 2019-04-08 NOTE — Addendum Note (Signed)
Addended by: Berton Lan R on: 04/08/2019 09:13 AM   Modules accepted: Orders

## 2019-05-06 ENCOUNTER — Telehealth: Payer: Self-pay

## 2019-05-06 NOTE — Telephone Encounter (Signed)
Copied from CRM [id number]. Topic: General - Other >> May 05, 2019  5:01 PM Mcneil, Ja-Kwan wrote: Reason for CRM: Pt stated she possibly has a UTI. Pt states would like to see Dr. Okey Dupre but it seems that she always get referred to another provider. Pt requests a call back to schedule an appt or to discuss getting a referral to a specialist. >> May 06, 2019  7:35 AM Claris Pong wrote: If she needs an appt I leave at 16 today

## 2019-05-06 NOTE — Telephone Encounter (Signed)
If patient cannot do doxy are you okay with in office visit as long as symptom free

## 2019-05-06 NOTE — Telephone Encounter (Signed)
Can have in person, needs to bring blood sugar log to visit if possible as she never followed up with this.

## 2019-05-07 NOTE — Telephone Encounter (Signed)
appt scheduled

## 2019-05-10 ENCOUNTER — Ambulatory Visit (INDEPENDENT_AMBULATORY_CARE_PROVIDER_SITE_OTHER): Payer: Medicare Other | Admitting: Internal Medicine

## 2019-05-10 ENCOUNTER — Other Ambulatory Visit: Payer: Self-pay

## 2019-05-10 ENCOUNTER — Encounter: Payer: Self-pay | Admitting: Internal Medicine

## 2019-05-10 ENCOUNTER — Other Ambulatory Visit (INDEPENDENT_AMBULATORY_CARE_PROVIDER_SITE_OTHER): Payer: Medicare Other

## 2019-05-10 VITALS — BP 124/84 | HR 90 | Temp 98.1°F | Ht 64.0 in | Wt 194.0 lb

## 2019-05-10 DIAGNOSIS — IMO0002 Reserved for concepts with insufficient information to code with codable children: Secondary | ICD-10-CM

## 2019-05-10 DIAGNOSIS — N3001 Acute cystitis with hematuria: Secondary | ICD-10-CM

## 2019-05-10 DIAGNOSIS — E1149 Type 2 diabetes mellitus with other diabetic neurological complication: Secondary | ICD-10-CM

## 2019-05-10 DIAGNOSIS — N3000 Acute cystitis without hematuria: Secondary | ICD-10-CM | POA: Insufficient documentation

## 2019-05-10 DIAGNOSIS — E1165 Type 2 diabetes mellitus with hyperglycemia: Secondary | ICD-10-CM | POA: Diagnosis not present

## 2019-05-10 DIAGNOSIS — I872 Venous insufficiency (chronic) (peripheral): Secondary | ICD-10-CM | POA: Diagnosis not present

## 2019-05-10 DIAGNOSIS — R399 Unspecified symptoms and signs involving the genitourinary system: Secondary | ICD-10-CM | POA: Diagnosis not present

## 2019-05-10 LAB — COMPREHENSIVE METABOLIC PANEL
ALT: 24 U/L (ref 0–35)
AST: 22 U/L (ref 0–37)
Albumin: 3.6 g/dL (ref 3.5–5.2)
Alkaline Phosphatase: 130 U/L — ABNORMAL HIGH (ref 39–117)
BUN: 13 mg/dL (ref 6–23)
CO2: 29 mEq/L (ref 19–32)
Calcium: 9.4 mg/dL (ref 8.4–10.5)
Chloride: 102 mEq/L (ref 96–112)
Creatinine, Ser: 0.63 mg/dL (ref 0.40–1.20)
GFR: 92.83 mL/min (ref >60.00)
Glucose, Bld: 107 mg/dL — ABNORMAL HIGH (ref 70–99)
Potassium: 4.2 mEq/L (ref 3.5–5.1)
Sodium: 138 mEq/L (ref 135–145)
Total Bilirubin: 0.3 mg/dL (ref 0.2–1.2)
Total Protein: 7.4 g/dL (ref 6.0–8.3)

## 2019-05-10 LAB — POCT URINALYSIS DIPSTICK
Bilirubin, UA: NEGATIVE
Blood, UA: POSITIVE
Glucose, UA: NEGATIVE
Ketones, UA: NEGATIVE
Nitrite, UA: NEGATIVE
Protein, UA: POSITIVE — AB
Spec Grav, UA: 1.02 (ref 1.010–1.025)
Urobilinogen, UA: 0.2 E.U./dL
pH, UA: 6 (ref 5.0–8.0)

## 2019-05-10 LAB — MICROALBUMIN / CREATININE URINE RATIO
Creatinine,U: 55.7 mg/dL
Microalb Creat Ratio: 21.8 mg/g (ref 0.0–30.0)
Microalb, Ur: 12.1 mg/dL — ABNORMAL HIGH (ref 0.0–1.9)

## 2019-05-10 LAB — CBC
HCT: 42.2 % (ref 36.0–46.0)
Hemoglobin: 14.6 g/dL (ref 12.0–15.0)
MCHC: 34.5 g/dL (ref 30.0–36.0)
MCV: 82.4 fl (ref 78.0–100.0)
Platelets: 260 10*3/uL (ref 150.0–400.0)
RBC: 5.12 Mil/uL — ABNORMAL HIGH (ref 3.87–5.11)
RDW: 13.5 % (ref 11.5–15.5)
WBC: 9.5 10*3/uL (ref 4.0–10.5)

## 2019-05-10 LAB — BRAIN NATRIURETIC PEPTIDE: Pro B Natriuretic peptide (BNP): 100 pg/mL (ref 0.0–100.0)

## 2019-05-10 LAB — HEMOGLOBIN A1C: Hgb A1c MFr Bld: 10.8 % — ABNORMAL HIGH (ref 4.6–6.5)

## 2019-05-10 MED ORDER — EZETIMIBE 10 MG PO TABS: 10.0000 mg | ORAL_TABLET | Freq: Every day | ORAL | 3 refills | Status: DC

## 2019-05-10 MED ORDER — NITROFURANTOIN MONOHYD MACRO 100 MG PO CAPS: 100.0000 mg | ORAL_CAPSULE | Freq: Two times a day (BID) | ORAL | 0 refills | Status: DC

## 2019-05-10 MED ORDER — CITALOPRAM HYDROBROMIDE 20 MG PO TABS: 20.0000 mg | ORAL_TABLET | Freq: Every day | ORAL | 1 refills | Status: DC

## 2019-05-10 MED ORDER — METFORMIN HCL 1000 MG PO TABS: ORAL_TABLET | ORAL | 3 refills | Status: DC

## 2019-05-10 MED ORDER — DICYCLOMINE HCL 20 MG PO TABS: ORAL_TABLET | ORAL | 3 refills | Status: DC

## 2019-05-10 MED ORDER — FUROSEMIDE 40 MG PO TABS: 40.0000 mg | ORAL_TABLET | Freq: Every day | ORAL | 3 refills | Status: DC

## 2019-05-10 MED ORDER — LISINOPRIL 20 MG PO TABS: 20.0000 mg | ORAL_TABLET | Freq: Every day | ORAL | 3 refills | Status: DC

## 2019-05-10 NOTE — Assessment & Plan Note (Signed)
Poorly controlled for some time and she has been working on diet since our last visit. Checking miroalbumin to creatinine ratio and adjust lisinopril 20 mg daily if needed. Taking metformin and 75/25 humalog. Last HgA1c was 13.2 and consistent with prior. She does not have financial means to see endo per her reports. Reminded her again about the serious complications that can result from her poorly controlled diabetes.

## 2019-05-10 NOTE — Assessment & Plan Note (Signed)
Rx for nitrofurantoin and POC U/A done in the office today.

## 2019-05-10 NOTE — Assessment & Plan Note (Addendum)
Suspect that this is still the cause of the swelling. She is fairly inactive. Checking BNP and CBC and CMP to rule out alternate causes. Increase lasix to 40 mg daily and new rx done.

## 2019-05-10 NOTE — Progress Notes (Signed)
   Subjective:   Patient ID: [name], female    DOB: [birth year], 73 y.o.   MRN: [id number]  HPI The patient is a 73 y.o. female coming in for several concerns including UTI symptoms (started about 1 month ago, main symptoms are: pressure, burning and frequency, denies fevers or chills or abdominal pain, overall it is stable, has tried nothing), and leg swelling (right leg swelling more, feels she has gained about 10 pounds in fluid, denies increase in salt, taking lasix 20 mg daily and has been taking every other day 2 pills as she feels that 1 pill does not work for her anymore, no rash and no pain in legs) and poorly controlled diabetes (last HgA1c Dec 2019 13.2, has been checking sugars in the morning lately and last 3 days 180 about, denies low sugars, taking insulin and metformin, has neuropathy and hyperlipidemia complicating this, has been trying to work on diet).   Review of Systems  Constitutional: Positive for activity change and unexpected weight change.  HENT: Negative.   Eyes: Negative.   Respiratory: Negative for cough, chest tightness and shortness of breath.   Cardiovascular: Positive for leg swelling. Negative for chest pain and palpitations.  Gastrointestinal: Negative for abdominal distention, abdominal pain, constipation, diarrhea, nausea and vomiting.  Genitourinary: Positive for dysuria, frequency and urgency. Negative for difficulty urinating, enuresis, flank pain and hematuria.  Musculoskeletal: Negative.   Skin: Negative.   Neurological: Positive for numbness.  Psychiatric/Behavioral: Negative.     Objective:  Physical Exam Constitutional:      Appearance: She is well-developed. She is obese.  HENT:     Head: Normocephalic and atraumatic.  Neck:     Musculoskeletal: Normal range of motion.  Cardiovascular:     Rate and Rhythm: Normal rate and regular rhythm.  Pulmonary:     Effort: Pulmonary effort is normal. No respiratory distress.     Breath  sounds: Normal breath sounds. No wheezing or rales.  Abdominal:     General: Bowel sounds are normal. There is no distension.     Palpations: Abdomen is soft.     Tenderness: There is no abdominal tenderness. There is no rebound.     Comments: Mild suprapubic tenderness  Musculoskeletal:     Right lower leg: Edema present.     Left lower leg: Edema present.     Comments: 2+ edema bilaterally to the knees  Skin:    General: Skin is warm and dry.  Neurological:     Mental Status: She is alert and oriented to person, place, and time.     Coordination: Coordination normal.     Vitals:   05/10/19 1004  BP: 124/84  Pulse: 90  Temp: 98.1 F (36.7 C)  TempSrc: Oral  SpO2: 98%  Weight: 194 lb (88 kg)  Height: 5\' 4"  (1.626 m)    Assessment & Plan:

## 2019-05-10 NOTE — Patient Instructions (Addendum)
We have sent in the lasix 40 mg to increase the strength to help with the fluid in the legs.   We have sent in macrobid (nitrofurantoin) to take 1 pill twice a day for 1 week.   We will check the labs today.

## 2019-05-12 ENCOUNTER — Telehealth: Payer: Self-pay | Admitting: *Deleted

## 2019-05-12 DIAGNOSIS — E1165 Type 2 diabetes mellitus with hyperglycemia: Secondary | ICD-10-CM

## 2019-05-12 DIAGNOSIS — E1149 Type 2 diabetes mellitus with other diabetic neurological complication: Secondary | ICD-10-CM

## 2019-05-12 DIAGNOSIS — IMO0002 Reserved for concepts with insufficient information to code with codable children: Secondary | ICD-10-CM

## 2019-05-12 NOTE — Telephone Encounter (Signed)
Referral placed. thanks

## 2019-05-12 NOTE — Telephone Encounter (Signed)
Pt informed of below. See 05/10/19 labs. She is willing to try diabetic specialist.   Notes recorded by Myrlene Broker, MD on 05/11/2019 at 3:53 PM EDT Please call and let her know that the diabetes is under slightly better control than last time but still very out of control. We think that you should see a diabetes specialist if willing to help get this under control. No signs of fluid overload.

## 2019-05-21 ENCOUNTER — Other Ambulatory Visit: Payer: Self-pay

## 2019-05-21 ENCOUNTER — Telehealth: Payer: Self-pay | Admitting: Internal Medicine

## 2019-05-21 DIAGNOSIS — E1165 Type 2 diabetes mellitus with hyperglycemia: Secondary | ICD-10-CM

## 2019-05-21 DIAGNOSIS — E1149 Type 2 diabetes mellitus with other diabetic neurological complication: Secondary | ICD-10-CM

## 2019-05-21 DIAGNOSIS — IMO0002 Reserved for concepts with insufficient information to code with codable children: Secondary | ICD-10-CM

## 2019-05-21 MED ORDER — GABAPENTIN 300 MG PO CAPS: 300.0000 mg | ORAL_CAPSULE | Freq: Three times a day (TID) | ORAL | 1 refills | Status: DC

## 2019-05-21 MED ORDER — GABAPENTIN 300 MG PO CAPS: 300.0000 mg | ORAL_CAPSULE | Freq: Three times a day (TID) | ORAL | 0 refills | Status: DC

## 2019-05-21 NOTE — Telephone Encounter (Signed)
Fine to send in to local and mail order.

## 2019-05-21 NOTE — Telephone Encounter (Signed)
No department wont be able to close but is it okay to send a small supply of gabapentin to local [place] till her mail order gets delivered. Patient is out of med

## 2019-05-21 NOTE — Telephone Encounter (Unsigned)
Copied from [place] [phone]. Topic: Quick Communication - Rx Refill/Question >> May 21, 2019  2:37 PM Mcneil, Jacinto Reap wrote: Pt stated she is completely out of the Gabapentin and she would like a Rx for a few capsules to be called in to [facility] until she can get her Rx from [facility]  Medication: gabapentin (NEURONTIN) 300 MG capsule  Has the patient contacted their pharmacy? yes   Preferred Pharmacy (with phone number or street name): [place], [place]  [phone] (Phone) [phone] (Fax)  Agent: Please be advised that RX refills may take up to 3 business days. We ask that you follow-up with your pharmacy.

## 2019-05-31 ENCOUNTER — Telehealth: Payer: Self-pay

## 2019-05-31 ENCOUNTER — Other Ambulatory Visit: Payer: Self-pay

## 2019-05-31 ENCOUNTER — Encounter: Payer: Self-pay | Admitting: Internal Medicine

## 2019-05-31 ENCOUNTER — Ambulatory Visit (INDEPENDENT_AMBULATORY_CARE_PROVIDER_SITE_OTHER): Payer: Medicare Other | Admitting: Internal Medicine

## 2019-05-31 VITALS — BP 132/80 | HR 88 | Ht 64.0 in | Wt 196.6 lb

## 2019-05-31 DIAGNOSIS — E785 Hyperlipidemia, unspecified: Secondary | ICD-10-CM

## 2019-05-31 DIAGNOSIS — IMO0002 Reserved for concepts with insufficient information to code with codable children: Secondary | ICD-10-CM

## 2019-05-31 DIAGNOSIS — E1149 Type 2 diabetes mellitus with other diabetic neurological complication: Secondary | ICD-10-CM

## 2019-05-31 DIAGNOSIS — E1165 Type 2 diabetes mellitus with hyperglycemia: Secondary | ICD-10-CM | POA: Diagnosis not present

## 2019-05-31 LAB — POCT GLYCOSYLATED HEMOGLOBIN (HGB A1C): Hemoglobin A1C: 10.3 % — AB (ref 4.0–5.6)

## 2019-05-31 LAB — GLUCOSE, POCT (MANUAL RESULT ENTRY): POC Glucose: 242 mg/dl — AB (ref 70–99)

## 2019-05-31 MED ORDER — INSULIN LISPRO PROT & LISPRO (75-25 MIX) 100 UNIT/ML KWIKPEN: 36.0000 [IU] | PEN_INJECTOR | Freq: Two times a day (BID) | SUBCUTANEOUS | 6 refills | Status: DC

## 2019-05-31 MED ORDER — METFORMIN HCL 1000 MG PO TABS: 500.0000 mg | ORAL_TABLET | Freq: Two times a day (BID) | ORAL | 3 refills | Status: DC

## 2019-05-31 MED ORDER — ONETOUCH VERIO VI STRP: ORAL_STRIP | 12 refills | Status: DC

## 2019-05-31 NOTE — Telephone Encounter (Signed)
Called pt and informed about Dr. Quin Hoop new orders. Verbalized acceptance and understanding.

## 2019-05-31 NOTE — Patient Instructions (Addendum)
-   Decrease Metformin to half a tablet Twice a day (Breakfast and Supper) - Increase Humalog Mix to 36 units with Breakfast and 36 units with Supper    - Try and check your sugar before breakfast and before Supper    HOW TO TREAT LOW BLOOD SUGARS (Blood sugar LESS THAN 70 MG/DL)  Please follow the RULE OF 15 for the treatment of hypoglycemia treatment (when your (blood sugars are less than 70 mg/dL)    STEP 1: Take 15 grams of carbohydrates when your blood sugar is low, which includes:   3-4 GLUCOSE TABS  OR  3-4 OZ OF JUICE OR REGULAR SODA OR  ONE TUBE OF GLUCOSE GEL     STEP 2: RECHECK blood sugar in 15 MINUTES STEP 3: If your blood sugar is still low at the 15 minute recheck --> then, go back to STEP 1 and treat AGAIN with another 15 grams of carbohydrates.

## 2019-05-31 NOTE — Telephone Encounter (Signed)
-----   Message from Cloyd Stagers, MD sent at 05/31/2019 12:09 PM EDT ----- Regarding: INsulin dose When I saw her today I asked her to increase insulin  from 30 units to 36 units ( She assured me she was taking it )  but then I called the pharmacy and it seems she has not picked any insulin in over 6 months. They [facility] other [facility] and there was no history of pick up.    Please call her and ask her to start with Humalog Mix at 30 units with Breakfast and 30 units with Supper  ( tell her since its a new pen) and if her Sugars are consistently over 200 by next week, she can increase to 36 units.    Thanks

## 2019-05-31 NOTE — Progress Notes (Signed)
Name: Cynthia LexRuth Frances Hern  MRN/ DOB: [id number], 05-14-1946   Age/ Sex: 73 y.o., female    PCP: Myrlene Brokerrawford, [name], MD   Reason for Endocrinology Evaluation: Type 2 Diabetes Mellitus     Date of Initial Endocrinology Visit: 05/31/2019     PATIENT IDENTIFIER: [name] is a 73 y.o. female with a past medical history of T2DM, hyperlipidemia, IBS. The patient presented for initial endocrinology clinic visit on 05/31/2019 for consultative assistance with her diabetes management.    HPI: [name] was    Diagnosed with T2DM  For over 20 yrs  Prior Medications tried/Intolerance: can not recall  Currently checking blood sugars 0 x / day Hypoglycemia episodes : no               Hemoglobin A1c has ranged from 7.7% in 2016, peaking at 15.3 %  in 2018. Patient required assistance for hypoglycemia: no Patient has required hospitalization within the last 1 year from hyper or hypoglycemia: no  In terms of diet, the patient avoids sugar sweetened beverages, eats 3 meals a day, does not snack usually   Lives alone.    HOME DIABETES REGIMEN: ReliOn  Mix (75/25) 30 units BID  Metformin 1000 mg had tablet TID - diarrhea    Statin:  On zetia  ACE-I/ARB: Yes Prior Diabetic Education: yes   METER DOWNLOAD SUMMARY: Does not check    DIABETIC COMPLICATIONS: Microvascular complications:   Neuropathy   Denies: CKD, retinopathy   Last eye exam: Completed 08/2018  Macrovascular complications:    Denies: CAD, PVD, CVA   PAST HISTORY: Past Medical History:  Past Medical History:  Diagnosis Date  . Achilles tendinitis   . Acute renal failure (ARF) (HCC) 12/19/2014  . Calcaneal spur    right  . Cataract   . Depression   . Diabetes mellitus    type II  . Diverticulosis of colon (without mention of hemorrhage) 2013  . GERD (gastroesophageal reflux disease)   . GI bleed 03/26/14  . Hyperlipidemia   . Hypertension   . Internal hemorrhoids 2013  .  Peripheral neuropathy   . Right shoulder pain    Subacromial tendinitis  . Venous insufficiency   . Ventral hernia    Past Surgical History:  Past Surgical History:  Procedure Laterality Date  . ABDOMINAL HYSTERECTOMY  2001   TAH-BSO  . CHOLECYSTECTOMY  2001  . COLONOSCOPY  2013   diverticulosis   . ESOPHAGOGASTRODUODENOSCOPY  2013   normal   . INCISIONAL HERNIA REPAIR    . TONSILLECTOMY        Social History:  reports that she has never smoked. She has never used smokeless tobacco. She reports that she does not drink alcohol or use drugs. Family History:  Family History  Problem Relation Age of Onset  . Heart failure Father        Died in 5280s  . CVA Father   . Ovarian cancer Sister   . Diabetes Sister   . Other Sister        died at birth  . Breast cancer Sister   . Heart failure Mother        Died in 3480s  . Other Brother        drowned  . Diabetes Paternal Grandmother   . Breast cancer Paternal Aunt   . Colon cancer Neg Hx      HOME MEDICATIONS: Allergies as of 05/31/2019      Reactions  Morphine Other (See Comments)   'took me out of this world' I had to be resuscitated   Codeine Sulfate Other (See Comments)   GI upset and pain   Demerol [meperidine] Nausea And Vomiting   Meperidine Hcl Nausea And Vomiting, Swelling   Propoxyphene Hcl Other (See Comments)   Darvocet caused sick headache      Medication List       Accurate as of May 31, 2019 11:50 AM. If you have any questions, ask your nurse or doctor.        STOP taking these medications   nitrofurantoin (macrocrystal-monohydrate) 100 MG capsule Commonly known as: Macrobid Stopped by: Scarlette ShortsIbtehal J Morley Gaumer, MD     TAKE these medications   ASPERCREME EX Apply 1 application topically daily as needed (shoulder/hips/knee/knuckles).   aspirin 81 MG tablet Take 81 mg by mouth daily.   citalopram 20 MG tablet Commonly known as: CELEXA Take 1 tablet (20 mg total) by mouth daily.    dicyclomine 20 MG tablet Commonly known as: BENTYL TAKE 1 TABLET (20 MG TOTAL) BY MOUTH 3 (THREE) TIMES DAILY BEFORE MEALS.   esomeprazole 20 MG capsule Commonly known as: NexIUM Take 1 capsule (20 mg total) by mouth daily at 12 noon.   ezetimibe 10 MG tablet Commonly known as: ZETIA Take 1 tablet (10 mg total) by mouth daily.   furosemide 40 MG tablet Commonly known as: LASIX Take 1 tablet (40 mg total) by mouth daily.   gabapentin 300 MG capsule Commonly known as: NEURONTIN Take 1 capsule (300 mg total) by mouth 3 (three) times daily.   Insulin Lispro Prot & Lispro (75-25) 100 UNIT/ML Kwikpen Commonly known as: HumaLOG Mix 75/25 KwikPen Inject 36 Units into the skin 2 (two) times daily. What changed: how much to take Changed by: Scarlette ShortsIbtehal J Marien Manship, MD   Insulin Pen Needle 31G X 8 MM Misc Use to inject insulin four times daily E11.41   lisinopril 20 MG tablet Commonly known as: ZESTRIL Take 1 tablet (20 mg total) by mouth daily.   metFORMIN 1000 MG tablet Commonly known as: GLUCOPHAGE Take 0.5 tablets (500 mg total) by mouth 2 (two) times daily with a meal. What changed:   how much to take  how to take this  when to take this  additional instructions Changed by: Scarlette ShortsIbtehal J Naureen Benton, MD   sodium chloride 0.65 % Soln nasal spray Commonly known as: OCEAN Place 1 spray into both nostrils 4 (four) times daily. What changed:   when to take this  reasons to take this        ALLERGIES: Allergies  Allergen Reactions  . Morphine Other (See Comments)    'took me out of this world' I had to be resuscitated  . Codeine Sulfate Other (See Comments)    GI upset and pain  . Demerol [Meperidine] Nausea And Vomiting  . Meperidine Hcl Nausea And Vomiting and Swelling  . Propoxyphene Hcl Other (See Comments)    Darvocet caused sick headache     REVIEW OF SYSTEMS: A comprehensive ROS was conducted with the patient and is negative except as per HPI and below:   Review of Systems  Constitutional: Negative for chills and fever.  HENT: Negative for congestion and sore throat.   Eyes: Negative for blurred vision and pain.  Respiratory: Negative for cough and shortness of breath.   Cardiovascular: Negative for chest pain and palpitations.  Gastrointestinal: Negative for diarrhea and nausea.  Genitourinary: Negative for frequency.  Skin:  Negative.   Neurological: Positive for tingling. Negative for tremors.  Endo/Heme/Allergies: Negative for polydipsia.  Psychiatric/Behavioral: Negative for depression. The patient is not nervous/anxious.       OBJECTIVE:   VITAL SIGNS: BP 132/80 (BP Location: Right Arm, Patient Position: Sitting, Cuff Size: Large)   Pulse 88   Ht 5\' 4"  (1.626 m)   Wt 196 lb 9.6 oz (89.2 kg)   SpO2 95%   BMI 33.75 kg/m    PHYSICAL EXAM:  General: Pt appears well and is in NAD  Hydration: Well-hydrated with moist mucous membranes and good skin turgor  HEENT: Head: Unremarkable . Oropharynx clear without exudate.  Eyes: External eye exam normal without stare, lid lag or exophthalmos.  EOM intact.    Neck: General: Supple without adenopathy or carotid bruits. Thyroid: Thyroid size normal.  No goiter or nodules appreciated. No thyroid bruit.  Lungs: Clear with good BS bilat with no rales, rhonchi, or wheezes  Heart: RRR with normal S1 and S2 , + systolic murmur  Abdomen: Normoactive bowel sounds, soft, nontender, without masses or organomegaly palpable  Extremities:  Lower extremities - 2+ nonpitting  pretibial edema.  Skin: Normal texture and temperature to palpation. No rash noted. No Acanthosis nigricans/skin tags. No lipohypertrophy.  Neuro: MS is good with appropriate affect, pt is alert and Ox3    DM foot exam:   The skin of the feet is intact without sores or ulcerations. The pedal pulses are not detectable most likely due to edema The sensation is decreased to a screening 5.07, 10 gram monofilament bilaterally    DATA REVIEWED:  Lab Results  Component Value Date   HGBA1C 10.3 (A) 05/31/2019   HGBA1C 10.8 (H) 05/10/2019   HGBA1C 13.2 (A) 11/12/2018   Lab Results  Component Value Date   MICROALBUR 12.1 (H) 05/10/2019   LDLCALC 139 (H) 10/22/2013   CREATININE 0.63 05/10/2019   Lab Results  Component Value Date   MICRALBCREAT 21.8 05/10/2019    Lab Results  Component Value Date   CHOL 178 11/12/2018   HDL 33.40 (L) 11/12/2018   LDLCALC 139 (H) 10/22/2013   LDLDIRECT 110.0 11/12/2018   TRIG 224.0 (H) 11/12/2018   CHOLHDL 5 11/12/2018        ASSESSMENT / PLAN / RECOMMENDATIONS:   1) Type 2 Diabetes Mellitus, Poorly controlled, With Neuropathic complications - Most recent A1c of 10.3 %. Goal A1c < 7.0 %.    Plan: GENERAL:  An A1c of 10.3% is most consistent with no insulin intake or that she eats so poorly to where the current doses of insulin are not able to keep up with hyperglycemia, but the patient denies both. She has no evidence of lipodystrophy either. I am unable to ascertain her barriers today. I suspect she may have memory issues , but I am uncertain, she denied having issues affording her medications. She lives alone, doesn't have a lot of help, doesn't have a car, has sister in-law that helps at times, she is not in touch with her children.   I will refer her to [facility] and see if they can help me figure out what her barriers are  I have discussed with the patient the pathophysiology of diabetes. We went over the natural progression of the disease. We talked about both insulin resistance and insulin deficiency. We stressed the importance of lifestyle changes including diet and exercise. I explained the complications associated with diabetes including retinopathy, nephropathy, neuropathy as well as increased risk of cardiovascular  disease. We went over the benefit seen with glycemic control.    I explained to the patient that diabetic patients are at higher than normal risk for  amputations.   I have encouraged her to check glucose, but she states this is painful, she also complains about insulin needles being painful. [facility], which requires syringes but has a lot of pen needles in her bag today which is for pens. ???  She was provided with a meter and was shown how to use it.   She seems to have issues with metformin causing diarrhea. She stopped it all together last month and restarted again, will reduce the dose.     MEDICATIONS:  Decrease Metformin 1000 mg Half a tablet with Breakfast and Supper  Increase Humalog Mix 36 units BID with meals   EDUCATION / INSTRUCTIONS:  BG monitoring instructions: Patient is instructed to check her blood sugars 2 times a day, before breakfast and supper.  Call [facility] if: BG persistently < 70 or > 300. . I reviewed the Rule of 15 for the treatment of hypoglycemia in detail with the patient. Literature supplied.   2) Diabetic complications:   Eye: Does not have known diabetic retinopathy.   Neuro/ Feet: Does have known diabetic peripheral neuropathy.  Renal: Patient does not have known baseline CKD. She is on an ACEI/ARB at present.  3) Dyslipidemia: Patient is on Zetia LDL above goal  At 110 mg/dL. Pt will benefit from statin therapy. No report of intolerance under allergy list. Will revisit on next visit    4) Hypertension: She is  at goal of < 140/90 mmHg.   Addendum: [facility], confirmed the patient was dispensed 30 mL of humalog Mix today. Prior to that she had a prescription ready in December, 2019 but the patient did not pick up. Pharmacist also searched if there was a pick up from another [facility] pharmacy but there was none. Based on that info, pt has not had any insulin for > 6 months. Based on that info , I will reduce her dose down to 30 units again and if BG's continue to be > 200 mg/dL, she would increase  to 36 units .   F/u in 8 weeks   Signed electronically by: Lyndle HerrlichAbby Jaralla Derwin Reddy, MD  [facility]  [facility] [address] [place], [place] [address] Phone: [phone] FAX: [phone]   CC: Myrlene BrokerCrawford, [name], MD [address] [place] [id number] Phone: [phone]  Fax: [phone]    Return to Endocrinology clinic as below: Future Appointments  Date Time Provider Department Center  08/30/2019  9:50 AM Alister Staver, Konrad DoloresIbtehal Jaralla, MD [facility] None

## 2019-06-07 ENCOUNTER — Other Ambulatory Visit: Payer: Self-pay | Admitting: *Deleted

## 2019-06-07 NOTE — Patient Outreach (Signed)
[facility] Care Management  06/07/2019  Cynthia LexRuth Frances [name] 1946/01/16 [id number]   Subjective: Telephone call to patient's home / mobile number times 2, spoke with patient, and HIPAA verified.  Discussed [facility] MD referral follow up, patient voiced understanding, and is in agreement to follow up.   Patient states she is doing alright, having trouble managing her time,  organizing her time, getting up in the morning, forget to take insulin, wakes up very shaky in the morning with stomach upset, does not eat healthy diet, has problems with some of her teeth, having trouble managing her diabetes, blood sugars are running in the 200 range, and is in agreement to referral to [facility] Health Coach for diabetes monitoring / management. States her endocrinologist is aware of her current signs / symptoms, patient states she is aware of signs/ symptoms to report, how to reach provider if needed after hours, when to go to ED, and / or call 911.  States she will also call MD for sooner follow up appointment if needed.  Patient states she has 2 blood glucose monitoring meters, she is currently using [facility].   States she declines to use the blood glucose meter Dr. Okey Duprerawford gave her with the free test strips at this time, because the test strips may be too costly in the future,  and will use it as a back up meter.   Patient states her fingers are very sore, it hurts when she does the finger sticks, does rotate sites, MD is aware, and declines referral to [facility] Pharm[facility] for medication assistance or medication management at this time.  Depression screening assessment completed, discussed results with patient, states she has a history of depression, was a caregiver for parents for 9 years, has gone through a divorce, declined treatment for depression in the past, declined use of any mind altering medications, is in  agreement with referral to [facility] Social Worker for depression screening follow up, Architectural technologistcommunity resource identification, in agreement for Social Worker to discuss screening results with primary MD if needed.   States when she feels down, or starts to have " a pity party", will try to take a shower, to get a fresh start for the day, and this approach will sometimes assist her with feeling better. Patient states her bathroom is in need of some repairs, unable to obtain at this time due to financial concerns, wants this information to be kept confidential, and declines referral to [facility] Social Worker for financial assistance with home repairs / modifications.   Discussed [facility] services with patient, declined referral to Pharmacy for medication assistance/ management and Social Worker for home modification financial assistance at this time, states she will access in the future if needed.   Patient states she is able to manage self care, having some memory issue,  and has assistance as needed.  States her sister-in-law Stann Mainland(Kathy Patrick) and her friend Magnus IvanLouie, are very supportive, and frequently check on her.    Patient voices understanding of medical diagnosis and treatment plan.  Patient states she does not have any transportation or pharmacy needs at this time.  States she is very appreciative of the follow up and is in agreement to receive [facility] services.    Objective: Per KPN (Knowledge Performance Now, point of care tool) and chart review, patient has had no recent hospitalizations or ED visits.   Patient  has a history of diabetes, hypertension, acute renal failure, hyperlipidemia, diverticulosis of the colon, and Peripheral neuropathy.    Assessment: Received [facility] MD Referral on 05/31/2019.   Referral source: Dr. Melanie Crazier [facility].   Referral reason per referral: Pt with T2DM, uncontrolled for a Long time. She lives alone, her A1c is not  consistent with insulin intake as prescribed. Concerned about unknown barriers, my suspicion is memory issues but hard to tell from today's visit. Can you kindly assess, This pt if she qualifies.     Screening follow up completed, will refer patient to [place] for diabetes monitoring / management, and to [place] Management Social Worker for depression screening follow up, Chartered certified accountant, and Social Worker to discuss screening results with primary MD if needed.     Plan: RNCM will refer patient to [place] for diabetes monitoring and diabetes management. RNCM will refer patient to [place] Management Social Worker for depression screening follow up, Chartered certified accountant, and patient is in agreement for Social Worker to discuss screening results with primary MD if needed.    Elesia Pemberton H. Annia Friendly, BSN, [place] [facility] Telephonic CM Phone: [phone] Fax: [phone]

## 2019-06-08 ENCOUNTER — Other Ambulatory Visit: Payer: Self-pay

## 2019-06-09 ENCOUNTER — Other Ambulatory Visit: Payer: Self-pay | Admitting: *Deleted

## 2019-06-09 ENCOUNTER — Encounter: Payer: Self-pay | Admitting: *Deleted

## 2019-06-09 NOTE — Patient Outreach (Signed)
[facility]) Care Management  06/09/2019  Aryelle Figg 19-Dec-1945 [id number]  CSW was able to make initial contact with patient today to perform phone assessment, as well as assess and assist with social work needs and services.  CSW introduced self, explained role and types of services provided through [place] [facility] ([place] [facility]).  CSW further explained to patient that CSW works with patient's RNCM, also with [place] [facility], Sonda Rumble. CSW then explained the reason for the call, indicating that Mrs. Burt Knack thought that patient would benefit from social work services and resources to assist with counseling and supportive services for symptoms of depression.  CSW obtained two HIPAA compliant identifiers from patient, which included patient's name and date of birth.  Patient admitted that she suffers from symptoms of depression, but indicated, "It's nothing that I can't manage on my own". Patient went on to say that she believes her symptoms of depression stem from being lonely.  Patient indicated that she does not have any family members that live nearby, nor does she have friends that are able to visit during COVID-19 restrictions.  Patient reported that she spends the majority of her time either sleeping or reading books.  CSW spoke with patient about attending an Adult Day Program, such as [facility] ([facility]) or PACE ([facility] [place] [facility]), but patient did not appear to be interested.   Patient stated that she declined treatment with Mrs. Cooper, as well as any mind altering medications; however, CSW noted that patient currently takes an antidepressant medication, Celexa 20 MG, PO Daily.  Patient admitted that she does not drive, making it difficult for her to be able to attend outpatient counseling services.  CSW explained to patient that patient's assigned LCSW, also with [place] [facility], Eduard Clos is able to provide patient with free telephonic counseling services, if patient decides that she is interested.  Patient reported that she would give it some thought and let Ms. Marcelline Deist know next week if she is interested in receiving services.  CSW explained to patient that CSW will have Ms. Caldwell follow-up with patient next week, preferably on Wednesday, June 16, 2019.  In the meantime, CSW agreed to mail patient the following EMMI information for her review:  Depression; Depression: Other Things You Can Do; and Coping with a Health Condition: Signs of Depression.  Patient denies having received counseling services in the past, not sure how the whole process works.  CSW went in-depth with patient about what to expect from a counselor and from receiving counseling services.  Patient denied wanting CSW to send her a list of counselors/therapists in [place] that accept [facility], admitting that she would never follow-up.  Nat Christen, BSW, MSW, LCSW  Licensed Education officer, [facility]  Mailing [place] [address] [place], [place] [address] Physical Address-[address], [place], [place] [address] Toll Free Main # [phone] Fax # [phone] Cell # [phone]  Office # [phone] [email or web address]

## 2019-06-10 ENCOUNTER — Ambulatory Visit: Payer: Self-pay | Admitting: *Deleted

## 2019-06-16 ENCOUNTER — Other Ambulatory Visit: Payer: Self-pay | Admitting: *Deleted

## 2019-06-16 ENCOUNTER — Ambulatory Visit: Payer: Medicare Other | Admitting: *Deleted

## 2019-06-16 ENCOUNTER — Encounter: Payer: Self-pay | Admitting: *Deleted

## 2019-06-16 NOTE — Patient Outreach (Signed)
[facility]) Care Management  06/16/2019  Akire Rennert 06-Oct-1946 [id number]   CSW was able to make contact with patient today to follow-up regarding social work services and resources, as well as to assess patient's willingness to receive counseling and supportive services, either telephonically through [facility] or through a counselor/therapist in the community.  Patient confirmed with CSW that she received the EMMI information that CSW mailed to her home for her review, which included all of the following:  Depression; Depression: Other Things You Can Do; and Coping with a Health Condition: Signs of Depression.  Patient indicated that she has read through the [facility] information provided, but stated, "It's really nothing that I don't already know".  Patient admitted to suffering from symptoms of depression for the majority of her life, but has never felt the need to seek treatment.  Patient reported that she will continue to take her psychotropic medications exactly as prescribed and agreed to contact CSW directly if she changes her mind about receiving services.  Patient went on to say that she is able to manage her symptoms well, and that talking about her symptoms would only make her feel more depressed.  CSW interjected, explaining that counseling can help teach and give patient the skills needed to overcome depressive thinking, as well as look into causes and reasons for onset.  Further, counseling provides patient an opportunity to talk about her feelings openly and in a safe environment to help her find better ways to cope.  CSW explained to patient the different counseling techniques used for depression, indicating that CSW believes that patient would respond well to Cognitive Behavioral Therapy(CBT).  CSW went on to explain that CBT is usually a short-term treatment option that helps a person overcome certain maladaptive behaviors, is empirically supported, and is known to  effectively help treat patients.  Patient denied seeing any benefit to any of the counseling techniques discussed.  CSW will perform a case closure on patient, as patient is not receptive to receiving counseling and supportive services, of any kind, at the present time, and no additional social work needs have been identified.  CSW will notify patient's Telephonic RNCM with [facility], Lazaro Arms of CSW's plans to close patient's case.  CSW will fax an update to patient's Primary Care Physician, Dr. Pricilla Holm to ensure that they are aware of CSW's involvement with patient's plan of care.    Nat Christen, BSW, MSW, LCSW  Licensed Education officer, [facility]  Mailing [place] [address], [place], [place] [address] Physical Address-[address], [place], [place] [address] Toll Free Main # [phone] Fax # [phone] Cell # [phone]  Office # [phone] [email or web address]

## 2019-06-28 ENCOUNTER — Other Ambulatory Visit: Payer: Self-pay

## 2019-06-28 NOTE — Patient Outreach (Signed)
[facility]) Care Management  06/28/2019   [name] [birth year] [id number]      Outreach attempt # 1 to the patient for initial assessment.  HIPAA verified by the patient.  The patient was able to complete the assessment.  Social: The patient lives alone.  She states that she has sister that will help her If she can.  She states that she does not have a good support system.  She is independent with her daily needs.  She does not drive but has transportation provided by insurance and her sister if she can. The patient states that she has chronic pain in her hands and feet and rates the pain fluctuating between a 7/10 to 9/10.  She has medication that she takes for the pain but feels that it just takes the tip of the pain away.  The equipment in the home includes Eyeglasses, CBG meter, and hand held grab bars in the shower.  Conditions: Per chart review and speaking with the patient her conditions include:  HTN, Venous (peripheral) insufficiency, GERD, IBS, Hyperlipidemia, Type II diabetes and depression.  The patient states that her last a1c was 10.3.  She said that she does not check her blood sugars daily because it hurts her fingers. Her blood sugar yesterday was 180 fasting and it is lower than it usually is. Discussed with the patient why it is important for her to check her blood sugars daily.  She verbalized understanding.  She uses a Relion meter but has a one touch as a back up meter.  She states that she does not have the money for the strips for the one touch.  The patient states she saw an endocrinologist for the first time.  She was unable to remember the doctors name.  Medications: The patient is on twelve medications.  She states that she manages her medications. She states at this point now she does not need any help paying for  her medications. She does state that she gets a lot of diarrhea from taking metformin.  She only takes a half of 1 tablet bid and  she feels that ir helps as long as she has a good meal.  Appointments: The patient has a 3 month follow up appointment with endocrinology 08/30/19.  Advanced Directives: The patient has a living will and states she gave the hospital a copy.     Current Medications:  Current Outpatient Medications  Medication Sig Dispense Refill  . aspirin 81 MG tablet Take 81 mg by mouth daily.    . citalopram (CELEXA) 20 MG tablet Take 1 tablet (20 mg total) by mouth daily. 90 tablet 1  . dicyclomine (BENTYL) 20 MG tablet TAKE 1 TABLET (20 MG TOTAL) BY MOUTH 3 (THREE) TIMES DAILY BEFORE MEALS. 270 tablet 3  . esomeprazole (NEXIUM) 20 MG capsule Take 1 capsule (20 mg total) by mouth daily at 12 noon. 90 capsule 3  . ezetimibe (ZETIA) 10 MG tablet Take 1 tablet (10 mg total) by mouth daily. 90 tablet 3  . furosemide (LASIX) 40 MG tablet Take 1 tablet (40 mg total) by mouth daily. 90 tablet 3  . gabapentin (NEURONTIN) 300 MG capsule Take 1 capsule (300 mg total) by mouth 3 (three) times daily. 90 capsule 0  . glucose blood (ONETOUCH VERIO) test strip Use as instructed 150 each 12  . Insulin Lispro Prot & Lispro (HUMALOG MIX 75/25 KWIKPEN) (75-25) 100 UNIT/ML Kwikpen Inject 36 Units into the skin 2 (two)  times daily. 30 mL 6  . Insulin Pen Needle 31G X 8 MM MISC Use to inject insulin four times daily E11.41 100 each 3  . lisinopril (ZESTRIL) 20 MG tablet Take 1 tablet (20 mg total) by mouth daily. 90 tablet 3  . metFORMIN (GLUCOPHAGE) 1000 MG tablet Take 0.5 tablets (500 mg total) by mouth 2 (two) times daily with a meal. 90 tablet 3  . sodium chloride (OCEAN) 0.65 % SOLN nasal spray Place 1 spray into both nostrils 4 (four) times daily. (Patient taking differently: Place 1 spray into both nostrils as needed for congestion. ) 30 mL 2  . Trolamine Salicylate (ASPERCREME EX) Apply 1 application topically daily as needed (shoulder/hips/knee/knuckles).     No current facility-administered medications for this  visit.     Functional Status:  In your present state of health, do you have any difficulty performing the following activities: 06/28/2019 06/09/2019  Hearing? N N  Vision? N N  Difficulty concentrating or making decisions? N N  Walking or climbing stairs? N N  Dressing or bathing? N N  Doing errands, shopping? N N  Preparing Food and eating ? - N  Using the Toilet? - N  In the past six months, have you accidently leaked urine? - N  Do you have problems with loss of bowel control? - N  Managing your Medications? - N  Managing your Finances? - N  Housekeeping or managing your Housekeeping? - N  Some recent data might be hidden    Fall/Depression Screening: Fall Risk  06/28/2019 06/09/2019 12/15/2018  Falls in the past year? 0 0 0  Number falls in past yr: - 0 -  Injury with Fall? - 0 -  Comment - - -  Risk for fall due to : - - -  Follow up - Education provided;Falls prevention discussed -   PHQ 2/9 Scores 06/28/2019 06/07/2019 10/21/2017 07/29/2016 10/09/2015 05/13/2014 04/06/2014  PHQ - 2 Score 6 2 1  0 0 0 0  PHQ- 9 Score 15 17 - - - - -    Assessment: Patient will benefit from health coach outreach for disease management and support.  [facility] CM Care Plan Problem One     Most Recent Value  Care Plan Problem One  knowledge deficit of diease management. as evidence vy a1c 10.3  Role Documenting the Problem One  Health Coach  Care Plan for Problem One  Active  [facility] Long Term Goal   In 30 days the patient will verbalize lowering her a1c by 1-2 points  [facility] Start Date  06/28/19  Interventions for Problem One Long Term Goal  Discussed diet , fasting blood sugars, the importance of checking her blood sugars, medication adherence        Plan: RN Health Coach will provide ongoing education for patient on diabetes through phone calls and sending printed information to patient for further discussion.  RN Health Coach will send Booklet on diabetes.  RN Health Coach will send initial  barriers letter, assessment, and care plan to primary care physician.  Rn Health Coach will send Consent form.  Juanell Fairlyraci Yeray Tomas RN, BSN, [facility] RN Health Coach Disease Management [facility] Direct Dial:  [phone]  Fax: [phone]

## 2019-07-15 ENCOUNTER — Other Ambulatory Visit: Payer: Self-pay | Admitting: Internal Medicine

## 2019-07-29 ENCOUNTER — Other Ambulatory Visit: Payer: Self-pay

## 2019-07-29 NOTE — Patient Outreach (Signed)
[facility]) Care Management  07/29/2019   [name] 08-26-1946 [id number]  Subjective: Successful outreach to the patient.  Two patient identifiers given.  The patient states that she is doing fair.  She states that she has pain in her hands daily.  She rates the pain at a 8/10.  When this happens she will go soak her hands under warm water, use Asper cream or take a tylenol.  Because her hands hurt she will not stick her finger.  She said that when she gets ready to it makes her cry and she will put the equipment down.  Previously hand talked with the patient about the dial and depth of the needle on the lancet and making an adjustment may help with the pain.  She states that she tried that and it still hurts. Advised the patient that usually there are two different types of heads that you can put on your lancet.  One you can use to stick your finger and one for her arm.  She states that the glucometer that she purchased  from [employer] does not have any extra head to use to stick her arm.  She has a new glucometer given to her.  She said that it is in a drawer in another room.  She feels that her [employer] meter works just fine. Discussed with the patient the importance of checking her blood sugars and the signs and symptoms of hyperglycemia. She verbalized understanding but states she just can not stick her finger.  Advised the patient to call her endocrinology office and let them know about her situation. I informed her she has a follow up appointment on 08/30/19. She verbalized understanding and wrote it down.   Current Medications:  Current Outpatient Medications  Medication Sig Dispense Refill  . aspirin 81 MG tablet Take 81 mg by mouth daily.    . citalopram (CELEXA) 20 MG tablet Take 1 tablet (20 mg total) by mouth daily. 90 tablet 1  . dicyclomine (BENTYL) 20 MG tablet TAKE 1 TABLET (20 MG TOTAL) BY MOUTH 3 (THREE) TIMES DAILY BEFORE MEALS. 270 tablet 3  .  esomeprazole (NEXIUM) 20 MG capsule Take 1 capsule (20 mg total) by mouth daily at 12 noon. 90 capsule 3  . ezetimibe (ZETIA) 10 MG tablet Take 1 tablet (10 mg total) by mouth daily. 90 tablet 3  . furosemide (LASIX) 40 MG tablet Take 1 tablet (40 mg total) by mouth daily. 90 tablet 3  . gabapentin (NEURONTIN) 300 MG capsule Take 1 capsule (300 mg total) by mouth 3 (three) times daily. 90 capsule 0  . glucose blood (ONETOUCH VERIO) test strip Use as instructed 150 each 12  . Insulin Lispro Prot & Lispro (HUMALOG MIX 75/25 KWIKPEN) (75-25) 100 UNIT/ML Kwikpen Inject 36 Units into the skin 2 (two) times daily. 30 mL 6  . Insulin Pen Needle 31G X 8 MM MISC Use to inject insulin four times daily E11.41 100 each 3  . lisinopril (ZESTRIL) 20 MG tablet Take 1 tablet (20 mg total) by mouth daily. 90 tablet 3  . metFORMIN (GLUCOPHAGE) 1000 MG tablet Take 0.5 tablets (500 mg total) by mouth 2 (two) times daily with a meal. 90 tablet 3  . sodium chloride (OCEAN) 0.65 % SOLN nasal spray Place 1 spray into both nostrils 4 (four) times daily. (Patient taking differently: Place 1 spray into both nostrils as needed for congestion. ) 30 mL 2  . Trolamine Salicylate (ASPERCREME EX) Apply  1 application topically daily as needed (shoulder/hips/knee/knuckles).     No current facility-administered medications for this visit.     Functional Status:  In your present state of health, do you have any difficulty performing the following activities: 06/28/2019 06/09/2019  Hearing? N N  Vision? N N  Difficulty concentrating or making decisions? N N  Walking or climbing stairs? N N  Dressing or bathing? N N  Doing errands, shopping? N N  Preparing Food and eating ? - N  Using the Toilet? - N  In the past six months, have you accidently leaked urine? - N  Do you have problems with loss of bowel control? - N  Managing your Medications? - N  Managing your Finances? - N  Housekeeping or managing your Housekeeping? - N   Some recent data might be hidden    Fall/Depression Screening: Fall Risk  07/29/2019 06/28/2019 06/09/2019  Falls in the past year? 0 0 0  Number falls in past yr: - - 0  Injury with Fall? - - 0  Comment - - -  Risk for fall due to : - - -  Follow up - - Education provided;Falls prevention discussed   PHQ 2/9 Scores 06/28/2019 06/07/2019 10/21/2017 07/29/2016 10/09/2015 05/13/2014 04/06/2014  PHQ - 2 Score 6 2 1  0 0 0 0  PHQ- 9 Score 15 17 - - - - -    Assessment: Patient will continue to benefit from health coach outreach for disease management and support. [facility] CM Care Plan Problem One     Most Recent Value  [facility] Long Term Goal   In 30 days the patient will verbalize checking her blood sugars daily lowering her a1c by 1-2 points  [facility] Long Term Goal Start Date  07/29/19  Interventions for Problem One Long Term Goal  Discussed diabetes with the patient and checking  her blood sugars daily, the signs and symptoms of hypoglycemia,  Discussed the importance of checking her blood sugars and alternative to sticking her finger,  encouraged medication adherence and keeping her appointments.     Plan: RN Health Coach will contact patient in the month of September and patient agrees to next outreach. RN Health Coach will send note to primary care and endocrinologist.  Juanell Fairlyraci Azani Brogdon RN, BSN, [facility] RN Health Coach Disease Management [facility] Direct Dial:  [phone]  Fax: [phone]

## 2019-08-26 ENCOUNTER — Other Ambulatory Visit: Payer: Self-pay

## 2019-08-30 ENCOUNTER — Other Ambulatory Visit: Payer: Self-pay

## 2019-08-30 ENCOUNTER — Ambulatory Visit (INDEPENDENT_AMBULATORY_CARE_PROVIDER_SITE_OTHER): Payer: Medicare Other | Admitting: Internal Medicine

## 2019-08-30 ENCOUNTER — Encounter: Payer: Self-pay | Admitting: Internal Medicine

## 2019-08-30 VITALS — BP 124/78 | HR 86 | Temp 98.1°F | Ht 64.0 in | Wt 194.2 lb

## 2019-08-30 DIAGNOSIS — Z23 Encounter for immunization: Secondary | ICD-10-CM | POA: Diagnosis not present

## 2019-08-30 DIAGNOSIS — E1149 Type 2 diabetes mellitus with other diabetic neurological complication: Secondary | ICD-10-CM

## 2019-08-30 DIAGNOSIS — E1165 Type 2 diabetes mellitus with hyperglycemia: Secondary | ICD-10-CM

## 2019-08-30 DIAGNOSIS — IMO0002 Reserved for concepts with insufficient information to code with codable children: Secondary | ICD-10-CM

## 2019-08-30 DIAGNOSIS — R6889 Other general symptoms and signs: Secondary | ICD-10-CM | POA: Diagnosis not present

## 2019-08-30 LAB — POCT GLYCOSYLATED HEMOGLOBIN (HGB A1C): Hemoglobin A1C: 12.1 % — AB (ref 4.0–5.6)

## 2019-08-30 LAB — GLUCOSE, POCT (MANUAL RESULT ENTRY): POC Glucose: 365 mg/dl — AB (ref 70–99)

## 2019-08-30 MED ORDER — INSULIN LISPRO PROT & LISPRO (75-25 MIX) 100 UNIT/ML KWIKPEN: 44.0000 [IU] | PEN_INJECTOR | Freq: Two times a day (BID) | SUBCUTANEOUS | 6 refills | Status: DC

## 2019-08-30 NOTE — Progress Notes (Signed)
Name: [name]  Age/ Sex: 73 y.o., female   MRN/ DOB: [id number], 03-30-1946     PCP: Hoyt Koch, MD   Reason for Endocrinology Evaluation: Type 2 Diabetes Mellitus  Initial Endocrine Consultative Visit: 05/31/2019    PATIENT IDENTIFIER: [name] is a 73 y.o. female with a past medical history of T2DM, hyperlipidemia, IBS The patient has followed with Endocrinology clinic since 05/31/2019 for consultative assistance with management of her diabetes.  DIABETIC HISTORY:  [name] was diagnosed with T2D > 20 yrs ago. She has been on metformin for years, insulin introduced years ago with history of non-compliance. Her hemoglobin A1c has ranged from 7.7% in 2016, peaking at 15.3 %  in 2018.  On her initial visit to our clinic, her A1c was 10.3%, she was not taking insulin, we reduced metformin due to diarrhea with full dose.    Lives alone  SUBJECTIVE:   During the last visit (05/31/2019): A1c 10.3%. Reduced metformin and increased Humalog Mix   Today (08/30/2019): [name] is here for a 3 month follow up on diabetes management.  She checks her blood sugars 0 times daily. Unaware of hypoglycemic episodes. Otherwise, the patient has not required any recent emergency interventions for hypoglycemia and has not had recent hospitalizations secondary to hyper or hypoglycemic episodes.    ROS: As per HPI and as detailed below: Review of Systems  Constitutional: Negative for chills and fever.  HENT: Negative for congestion and sore throat.   Respiratory: Negative for cough and shortness of breath.   Cardiovascular: Negative for chest pain and palpitations.  Gastrointestinal: Negative for diarrhea and nausea.  Musculoskeletal: Positive for joint pain and myalgias.      HOME DIABETES REGIMEN:  - Metformin 1000 mg Half a tablet with Breakfast and Supper - Humalog Mix 40  units BID with meals     METER DOWNLOAD SUMMARY: Did not bring     HISTORY:  Past Medical History:  Past Medical History:  Diagnosis Date  . Achilles tendinitis   . Acute renal failure (ARF) ([place]) 12/19/2014  . Calcaneal spur    right  . Cataract   . Depression   . Diabetes mellitus    type II  . Diverticulosis of colon (without mention of hemorrhage) 2013  . GERD (gastroesophageal reflux disease)   . GI bleed 03/26/14  . Hyperlipidemia   . Hypertension   . Internal hemorrhoids 2013  . Peripheral neuropathy   . Right shoulder pain    Subacromial tendinitis  . Venous insufficiency   . Ventral hernia    Past Surgical History:  Past Surgical History:  Procedure Laterality Date  . ABDOMINAL HYSTERECTOMY  2001   TAH-BSO  . CHOLECYSTECTOMY  2001  . COLONOSCOPY  2013   diverticulosis   . ESOPHAGOGASTRODUODENOSCOPY  2013   normal   . INCISIONAL HERNIA REPAIR    . TONSILLECTOMY      Social History:  reports that she has never smoked. She has never used smokeless tobacco. She reports that she does not drink alcohol or use drugs. Family History:  Family History  Problem Relation Age of Onset  . Heart failure Father        Died in 3s  . CVA Father   . Ovarian cancer Sister   . Diabetes Sister   . Other Sister        died at birth  . Heart failure Mother        Died  in 20s  . Other Brother        drowned  . Diabetes Paternal Grandmother   . Breast cancer Paternal Aunt   . Colon cancer Neg Hx      HOME MEDICATIONS: Allergies as of 08/30/2019      Reactions   Morphine Other (See Comments)   'took me out of this world' I had to be resuscitated   Codeine Sulfate Other (See Comments)   GI upset and pain   Demerol [meperidine] Nausea And Vomiting   Meperidine Hcl Nausea And Vomiting, Swelling   Propoxyphene Hcl Other (See Comments)   Darvocet caused sick headache      Medication List       Accurate as of August 30, 2019 10:14 AM. If you have any questions, ask your nurse or doctor.        ASPERCREME EX Apply 1  application topically daily as needed (shoulder/hips/knee/knuckles).   aspirin 81 MG tablet Take 81 mg by mouth daily.   citalopram 20 MG tablet Commonly known as: CELEXA Take 1 tablet (20 mg total) by mouth daily.   dicyclomine 20 MG tablet Commonly known as: BENTYL TAKE 1 TABLET (20 MG TOTAL) BY MOUTH 3 (THREE) TIMES DAILY BEFORE MEALS.   esomeprazole 20 MG capsule Commonly known as: NexIUM Take 1 capsule (20 mg total) by mouth daily at 12 noon.   ezetimibe 10 MG tablet Commonly known as: ZETIA Take 1 tablet (10 mg total) by mouth daily.   furosemide 40 MG tablet Commonly known as: LASIX Take 1 tablet (40 mg total) by mouth daily.   gabapentin 300 MG capsule Commonly known as: NEURONTIN Take 1 capsule (300 mg total) by mouth 3 (three) times daily.   Insulin Lispro Prot & Lispro (75-25) 100 UNIT/ML Kwikpen Commonly known as: HumaLOG Mix 75/25 KwikPen Inject 44 Units into the skin 2 (two) times daily. What changed: how much to take Changed by: Scarlette Shorts, MD   Insulin Pen Needle 31G X 8 MM Misc Use to inject insulin four times daily E11.41   lisinopril 20 MG tablet Commonly known as: ZESTRIL Take 1 tablet (20 mg total) by mouth daily.   metFORMIN 1000 MG tablet Commonly known as: GLUCOPHAGE Take 0.5 tablets (500 mg total) by mouth 2 (two) times daily with a meal.   OneTouch Verio test strip Generic drug: glucose blood Use as instructed   sodium chloride 0.65 % Soln nasal spray Commonly known as: OCEAN Place 1 spray into both nostrils 4 (four) times daily. What changed:   when to take this  reasons to take this        OBJECTIVE:   Vital Signs: BP 124/78 (BP Location: Right Arm, Patient Position: Sitting, Cuff Size: Large)   Pulse 86   Temp 98.1 F (36.7 C)   Ht 5\' 4"  (1.626 m)   Wt 194 lb 3.2 oz (88.1 kg)   SpO2 96%   BMI 33.33 kg/m   Wt Readings from Last 3 Encounters:  08/30/19 194 lb 3.2 oz (88.1 kg)  05/31/19 196 lb 9.6 oz (89.2  kg)  05/10/19 194 lb (88 kg)     Exam: General: Pt appears well and is in NAD  Lungs: Clear with good BS bilat with no rales, rhonchi, or wheezes  Heart: RRR with normal S1 and S2 and no gallops; no murmurs; no rub  Abdomen: Normoactive bowel sounds, soft, nontender, without masses or organomegaly palpable  Extremities: No pretibial edema. No tremor.  Skin: Normal texture and temperature to palpation. No rash noted.  Neuro: MS is good with appropriate affect, pt is alert and Ox3    DM foot exam: 05/31/2019  The skin of the feet is intact without sores or ulcerations. The pedal pulses are not detectable most likely due to edema The sensation is decreased to a screening 5.07, 10 gram monofilament bilaterally   DATA REVIEWED:  Lab Results  Component Value Date   HGBA1C 12.1 (A) 08/30/2019   HGBA1C 10.3 (A) 05/31/2019   HGBA1C 10.8 (H) 05/10/2019   Lab Results  Component Value Date   MICROALBUR 12.1 (H) 05/10/2019   LDLCALC 139 (H) 10/22/2013   CREATININE 0.63 05/10/2019   Lab Results  Component Value Date   MICRALBCREAT 21.8 05/10/2019     Lab Results  Component Value Date   CHOL 178 11/12/2018   HDL 33.40 (L) 11/12/2018   LDLCALC 139 (H) 10/22/2013   LDLDIRECT 110.0 11/12/2018   TRIG 224.0 (H) 11/12/2018   CHOLHDL 5 11/12/2018       In-Office Bg 365  ASSESSMENT / PLAN / RECOMMENDATIONS:   1) Type 2 Diabetes Mellitus, Poorly controlled, With Neuropathic complications - Most recent 1c of 12.1 %. Goal A1c <7.0  %.    - Pt most likely is not taking insulin as prescribed, pt denies missing any insulin injections but today she ate breakfast and did not take Novolog, with an in-office BG of 365 mg/dl . When asked why she didn't take her insulin this morning, she stated because she didn't feel good.  - Pt denies missing  insulin on her last visit but after calling her pharmacy, pt had no insulin pickup for over 6 months.  - Unfortunately pt will continue with  hyperglycemia if she does not take her medications, we have enlisted [facility]'s help in the past but there's only so much they can do, pt lives alone and does not have much of a family support .  - Pt does not check glucose due to pain   MEDICATIONS:  Continue  Metformin half a tablet with Breakfast and Supper - Increase Humalog Mix to 44 units with Breakfast and 44 units with Supper      EDUCATION / INSTRUCTIONS:  BG monitoring instructions: Patient is instructed to check her blood sugars 2 times a day, fasting and supper  Call [facility] if: BG persistently < 70 or > 300. . I reviewed the Rule of 15 for the treatment of hypoglycemia in detail with the patient. Literature supplied.    F/U in 3 months    Signed electronically by: Lyndle HerrlichAbby Jaralla Shamleffer, MD  [facility]  [facility] [address] [place], [place] [address] Phone: [phone] FAX: [phone]   CC: Myrlene BrokerCrawford, [name], MD [address] [place] [id number] Phone: [phone]  Fax: [phone]  Return to Endocrinology clinic as below: Future Appointments  Date Time Provider Department Center  09/01/2019 11:00 AM Juanell Fairlyoles, Traci, RN [facility] None

## 2019-08-30 NOTE — Patient Instructions (Signed)
-   Continue  Metformin half a tablet with Breakfast and Supper - Increase Humalog Mix to 44 units with Breakfast and 44 units with Supper    - Try and check your sugar before breakfast and before Supper    HOW TO TREAT LOW BLOOD SUGARS (Blood sugar LESS THAN 70 MG/DL)  Please follow the RULE OF 15 for the treatment of hypoglycemia treatment (when your (blood sugars are less than 70 mg/dL)    STEP 1: Take 15 grams of carbohydrates when your blood sugar is low, which includes:   3-4 GLUCOSE TABS  OR  3-4 OZ OF JUICE OR REGULAR SODA OR  ONE TUBE OF GLUCOSE GEL     STEP 2: RECHECK blood sugar in 15 MINUTES STEP 3: If your blood sugar is still low at the 15 minute recheck --> then, go back to STEP 1 and treat AGAIN with another 15 grams of carbohydrates.

## 2019-09-01 ENCOUNTER — Other Ambulatory Visit: Payer: Self-pay

## 2019-09-01 NOTE — Patient Outreach (Signed)
[facility]) Care Management  09/01/2019   [name] 08-Dec-1946 [id number]  Subjective: Successful outreach to the patient.  Two patient identifiers given. The patient states that she has been doing fair.  She states that she has pain in her feet and rates the pain at 8/10.  She denies having any falls.  She states that she is still not checking her blood sugars.  She went to an appointment with her endocrinologist on 9/21 and her a1c went from a 10.3 to 12.1.  Reviewed with her the importance of why she needs to check her blood sugars.  She agreed to try to check her blood sugars at least once a day.  She states that she is going to try to eat on a regular schedule and incorporate protein in her meals.  She states that she is going to try to be more adherent with her medications.  RN Health Coach informed the patient that I will be moving to a different position and colleagues will follow up with her at the next scheduled interval.  Current Medications:  Current Outpatient Medications  Medication Sig Dispense Refill  . aspirin 81 MG tablet Take 81 mg by mouth daily.    . citalopram (CELEXA) 20 MG tablet Take 1 tablet (20 mg total) by mouth daily. 90 tablet 1  . dicyclomine (BENTYL) 20 MG tablet TAKE 1 TABLET (20 MG TOTAL) BY MOUTH 3 (THREE) TIMES DAILY BEFORE MEALS. 270 tablet 3  . esomeprazole (NEXIUM) 20 MG capsule Take 1 capsule (20 mg total) by mouth daily at 12 noon. 90 capsule 3  . ezetimibe (ZETIA) 10 MG tablet Take 1 tablet (10 mg total) by mouth daily. 90 tablet 3  . furosemide (LASIX) 40 MG tablet Take 1 tablet (40 mg total) by mouth daily. 90 tablet 3  . gabapentin (NEURONTIN) 300 MG capsule Take 1 capsule (300 mg total) by mouth 3 (three) times daily. 90 capsule 0  . Insulin Lispro Prot & Lispro (HUMALOG MIX 75/25 KWIKPEN) (75-25) 100 UNIT/ML Kwikpen Inject 44 Units into the skin 2 (two) times daily. 30 mL 6  . Insulin Pen Needle 31G X 8 MM MISC Use to  inject insulin four times daily E11.41 100 each 3  . lisinopril (ZESTRIL) 20 MG tablet Take 1 tablet (20 mg total) by mouth daily. 90 tablet 3  . metFORMIN (GLUCOPHAGE) 1000 MG tablet Take 0.5 tablets (500 mg total) by mouth 2 (two) times daily with a meal. 90 tablet 3  . sodium chloride (OCEAN) 0.65 % SOLN nasal spray Place 1 spray into both nostrils 4 (four) times daily. (Patient taking differently: Place 1 spray into both nostrils as needed for congestion. ) 30 mL 2  . Trolamine Salicylate (ASPERCREME EX) Apply 1 application topically daily as needed (shoulder/hips/knee/knuckles).    Marland Kitchen glucose blood (ONETOUCH VERIO) test strip Use as instructed (Patient not taking: Reported on 09/01/2019) 150 each 12   No current facility-administered medications for this visit.     Functional Status:  In your present state of health, do you have any difficulty performing the following activities: 06/28/2019 06/09/2019  Hearing? N N  Vision? N N  Difficulty concentrating or making decisions? N N  Walking or climbing stairs? N N  Dressing or bathing? N N  Doing errands, shopping? N N  Preparing Food and eating ? - N  Using the Toilet? - N  In the past six months, have you accidently leaked urine? - N  Do  you have problems with loss of bowel control? - N  Managing your Medications? - N  Managing your Finances? - N  Housekeeping or managing your Housekeeping? - N  Some recent data might be hidden    Fall/Depression Screening: Fall Risk  07/29/2019 06/28/2019 06/09/2019  Falls in the past year? 0 0 0  Number falls in past yr: - - 0  Injury with Fall? - - 0  Comment - - -  Risk for fall due to : - - -  Follow up - - Education provided;Falls prevention discussed   PHQ 2/9 Scores 06/28/2019 06/07/2019 10/21/2017 07/29/2016 10/09/2015 05/13/2014 04/06/2014  PHQ - 2 Score 6 2 1  0 0 0 0  PHQ- 9 Score 15 17 - - - - -    Assessment: Patient will continue to benefit from health coach outreach for disease management  and support. THN CM Care Plan Problem One     Most Recent Value  THN Long Term Goal   In 30 days the patient will verbalize checking her blood sugars daily lowering her a1c by 1-2 points  THN Long Term Goal Start Date  09/01/19  Interventions for Problem One Long Term Goal  Reviewed with the patient again about the importance of checking blood sugars, encourged medication adherence, encouraged the patient to eat on a regular schedule and eat more protein.     Plan: RN Health Coach will contact patient in the month of October and patient agrees to next outreach. RN Health Coach will evaluate at that time if the patient will be able to progress through her care plan.  Lazaro Arms RN, BSN, [place] Direct Dial:  [phone]  Fax: [phone]

## 2019-09-02 ENCOUNTER — Other Ambulatory Visit: Payer: Self-pay | Admitting: Internal Medicine

## 2019-09-02 NOTE — Telephone Encounter (Signed)
Should not be out until Dec which is when physical is due.

## 2019-09-28 ENCOUNTER — Ambulatory Visit (INDEPENDENT_AMBULATORY_CARE_PROVIDER_SITE_OTHER): Payer: Medicare Other | Admitting: Internal Medicine

## 2019-09-28 ENCOUNTER — Encounter: Payer: Self-pay | Admitting: Internal Medicine

## 2019-09-28 DIAGNOSIS — N3000 Acute cystitis without hematuria: Secondary | ICD-10-CM

## 2019-09-28 MED ORDER — NITROFURANTOIN MONOHYD MACRO 100 MG PO CAPS: 100.0000 mg | ORAL_CAPSULE | Freq: Two times a day (BID) | ORAL | 0 refills | Status: DC

## 2019-09-28 NOTE — Assessment & Plan Note (Signed)
Treating empirically with macrobid and if no resolution needs U/A and culture.

## 2019-09-28 NOTE — Progress Notes (Signed)
Virtual Visit via Video Note  I connected with Dub Mikes on 09/28/19 at  2:00 PM EDT by a video enabled telemedicine application and verified that I am speaking with the correct person using two identifiers.  The patient and the provider were at separate locations throughout the entire encounter.   I discussed the limitations of evaluation and management by telemedicine and the availability of in person appointments. The patient expressed understanding and agreed to proceed. The patient and the provider were the only parties present for the visit unless noted in HPI below.  History of Present Illness: The patient is a 73 y.o. female with visit for dysuria and urgency and frequency. Feels just like last UTI in June. Started about 3-4 days ago. Has no fevers or chills or nausea or vomiting. Denies fevers or chills. Overall it is worsening. Has tried drinking more fluids which has not helped.   Observations/Objective: Appearance: normal, breathing appears normal, casual grooming, abdomen does not appear distended some pain to suprapubic region to self palpation, throat normal, memory normal, mental status is A and O times 3  Assessment and Plan: See problem oriented charting  Follow Up Instructions: rx macrobid, if not resolving needs U/A and culture.   I discussed the assessment and treatment plan with the patient. The patient was provided an opportunity to ask questions and all were answered. The patient agreed with the plan and demonstrated an understanding of the instructions.   The patient was advised to call back or seek an in-person evaluation if the symptoms worsen or if the condition fails to improve as anticipated.  Hoyt Koch, MD

## 2019-10-01 ENCOUNTER — Other Ambulatory Visit: Payer: Self-pay

## 2019-10-01 NOTE — Patient Outreach (Signed)
[facility]) Care Management  10/01/2019   [name] [birth year] [id number]  Subjective: Successful outreach to the patient.  Two patient identifiers obtained.  The patient states that she has been doing fair.  She states that she feels tired a lot.  She is still not checking her blood sugars.  Reinterated to the patient the importance of checking her blood sugars, how diabetes can affect her body with an a1c that went form 10.3 to 12.1, and went over options that she could try to check her blood sugars.  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  When I asked was she taking her medications she states she is.  She states that she does not have a problem taking her insulin because sticking her self in the stomach does not hurt.  Advise the patient to speak with her PCP and endocrinologist about any issues that she may have.  The patient verbalized understanding.  Informed the patient that I will be transitioning to another position and one of my colleagues will be following up with her.  Current Medications:  Current Outpatient Medications  Medication Sig Dispense Refill  . aspirin 81 MG tablet Take 81 mg by mouth daily.    . citalopram (CELEXA) 20 MG tablet Take 1 tablet (20 mg total) by mouth daily. 90 tablet 1  . dicyclomine (BENTYL) 20 MG tablet TAKE 1 TABLET (20 MG TOTAL) BY MOUTH 3 (THREE) TIMES DAILY BEFORE MEALS. 270 tablet 3  . esomeprazole (NEXIUM) 20 MG capsule Take 1 capsule (20 mg total) by mouth daily at 12 noon. 90 capsule 3  . ezetimibe (ZETIA) 10 MG tablet Take 1 tablet (10 mg total) by mouth daily. 90 tablet 3  . furosemide (LASIX) 40 MG tablet Take 1 tablet (40 mg total) by mouth daily. 90 tablet 3  . gabapentin (NEURONTIN) 300 MG capsule Take 1 capsule (300 mg total) by mouth 3 (three) times daily. 90 capsule 0  . Insulin Lispro Prot & Lispro (HUMALOG MIX 75/25 KWIKPEN) (75-25) 100 UNIT/ML Kwikpen Inject 44 Units into the  skin 2 (two) times daily. 30 mL 6  . Insulin Pen Needle 31G X 8 MM MISC Use to inject insulin four times daily E11.41 100 each 3  . lisinopril (ZESTRIL) 20 MG tablet Take 1 tablet (20 mg total) by mouth daily. 90 tablet 3  . metFORMIN (GLUCOPHAGE) 1000 MG tablet Take 0.5 tablets (500 mg total) by mouth 2 (two) times daily with a meal. 90 tablet 3  . nitrofurantoin, macrocrystal-monohydrate, (MACROBID) 100 MG capsule Take 1 capsule (100 mg total) by mouth 2 (two) times daily. 14 capsule 0  . sodium chloride (OCEAN) 0.65 % SOLN nasal spray Place 1 spray into both nostrils 4 (four) times daily. (Patient taking differently: Place 1 spray into both nostrils as needed for congestion. ) 30 mL 2  . Trolamine Salicylate (ASPERCREME EX) Apply 1 application topically daily as needed (shoulder/hips/knee/knuckles).    Marland Kitchen glucose blood (ONETOUCH VERIO) test strip Use as instructed (Patient not taking: Reported on 09/01/2019) 150 each 12   No current facility-administered medications for this visit.     Functional Status:  In your present state of health, do you have any difficulty performing the following activities: 06/28/2019 06/09/2019  Hearing? N N  Vision? N N  Difficulty concentrating or making decisions? N N  Walking or climbing stairs? N N  Dressing or bathing? N N  Doing errands, shopping? N N  Preparing Food and eating ? - N  Using the Toilet? - N  In the past six months, have you accidently leaked urine? - N  Do you have problems with loss of bowel control? - N  Managing your Medications? - N  Managing your Finances? - N  Housekeeping or managing your Housekeeping? - N  Some recent data might be hidden    Fall/Depression Screening: Fall Risk  10/01/2019 10/01/2019 07/29/2019  Falls in the past year? 0 0 0  Number falls in past yr: - - -  Injury with Fall? - - -  Comment - - -  Risk for fall due to : - - -  Follow up - - -   PHQ 2/9 Scores 06/28/2019 06/07/2019 10/21/2017 07/29/2016  10/09/2015 05/13/2014 04/06/2014  PHQ - 2 Score 6 2 1  0 0 0 0  PHQ- 9 Score 15 17 - - - - -    Assessment: Patient will continue to benefit from health coach outreach for disease management and support. THN CM Care Plan Problem One     Most Recent Value  THN Long Term Goal   In 30 days the patient will verbalize checking her blood sugars daily lowering her a1c by 1-2 points  THN Long Term Goal Start Date  10/01/19  Interventions for Problem One Long Term Goal  reinterated the importance of checking her blood sugar, encouraged medicatio adherence,  advised the patient to talk with her physician about any issues that she may be having       Plan: RN Health Coach will close the case.  Referring the patient to complex case management for increase in a1c from 10.3 to 12.1 and inability to check her blood sugars.  10/03/19 RN, BSN, [facility] RN Health Coach Disease Management [facility] Dial:  [phone]  Fax: [phone]

## 2019-10-04 ENCOUNTER — Other Ambulatory Visit: Payer: Self-pay

## 2019-10-04 NOTE — Patient Outreach (Signed)
[facility]) Care Management  10/04/2019  [name] [birth year] [id number]    RN  Health Coach closed case due to elevate a1c and checking blood sugars.  Referral made to complex case management.  Plan:  RN Health Coach closed case and will send discipline closure letter to PCP.

## 2019-10-07 ENCOUNTER — Other Ambulatory Visit: Payer: Self-pay | Admitting: *Deleted

## 2019-10-07 NOTE — Patient Outreach (Signed)
[facility]) Care Management  10/07/2019  Austynn Pridmore 1946-09-05 [id number]    Referral received 10/23 Initial Outreach 10/29  Telephone Assessment-Unsuccessful  RN attempted outreach call today to pt however unsuccessful. RN able to leave a HIPAA approved voce message requesting a call back.  PLAN: RN will attempt another outreach call within the next week for pending services.  Raina Mina, RN Care Management Coordinator [place] Office [phone]

## 2019-10-15 ENCOUNTER — Other Ambulatory Visit: Payer: Self-pay | Admitting: *Deleted

## 2019-10-15 NOTE — Patient Outreach (Signed)
[place] [facility]) Care Management  10/15/2019  Horace Wishon 05/28/1946 [id number]    Telephone Assessment-Closure  RN spoke with pt in detail concerning [facility] services once again. Pt familiar previously with a health Coach. RN inquired further on the report fear of performing glucose sticks. Pt states with her pain and arthritis she had can will not perform this task. RN discussed the risk involved with no monitoring of her glucose readings. RN inquired if pt would complete one reading a week (decline). Further discussed her increased A1c from 10.3 to 12.1. Pt aware and unable to generate the last glucose she has preformed with her home meters. Pt again decline to preform any glucose readings.   Further discussed dietary foods to avoid as a diabetic and how these food items can increase her overall readings. Verify pt has several meters but again will not use at this time. RN other any other solution that pt may consider however pt continues to decline.   Based upon pt's decision for no glucose checks case will be closed as pt decline to participate. RN has notify Dr. Sharlet Salina of the above and case closure based upon pt's inability to participate with daily or weekly BS. No other needs or request for RN to address at this time.  Case will be closed.  Raina Mina, RN Care Management Coordinator [place] Office [phone]

## 2019-10-18 ENCOUNTER — Other Ambulatory Visit: Payer: Self-pay

## 2019-10-18 ENCOUNTER — Ambulatory Visit: Payer: Self-pay

## 2019-10-18 ENCOUNTER — Other Ambulatory Visit: Payer: Self-pay | Admitting: Internal Medicine

## 2019-10-18 MED ORDER — LISINOPRIL 20 MG PO TABS: 20.0000 mg | ORAL_TABLET | Freq: Every day | ORAL | 0 refills | Status: DC

## 2019-10-18 NOTE — Telephone Encounter (Signed)
Pt states she has lost her  lisinopril (ZESTRIL) 20 MG tablet. She does not know when refills are coming from [facility]. Requesting a 30 day supply.  Also told me she was having bad headaches and dizzy spells.  I transferred her to nurse triage.   [facility] [address], [place] [phone] (Phone) [phone] (Fax)

## 2019-10-18 NOTE — Telephone Encounter (Signed)
Patient informed of MD response and stated understanding   Patient wanted me to tell Dr. Sharlet Salina that the medicine that was given for UTI/bladder worked states she was told to let us know.

## 2019-10-18 NOTE — Telephone Encounter (Signed)
Pt. Reports she has "lost my lisinopril.I haven't taken it since last Thursday." Reports she now has a headache and dizziness. Requesting a refill on Lisinopril sent to [facility]. States she has a BP cuff, "But I can't see the numbers" Offered appointment. States she has to arrange transportation. Spoke with [place] in the practice and will forward triage for review.  Answer Assessment - Initial Assessment Questions 1. DESCRIPTION: "Describe your dizziness."     Dizzy 2. LIGHTHEADED: "Do you feel lightheaded?" (e.g., somewhat faint, woozy, weak upon standing)     Sudden movement 3. VERTIGO: "Do you feel like either you or the room is spinning or tilting?" (i.e. vertigo)    Yes 4. SEVERITY: "How bad is it?"  "Do you feel like you are going to faint?" "Can you stand and walk?"   - MILD - walking normally   - MODERATE - interferes with normal activities (e.g., work, school)    - SEVERE - unable to stand, requires support to walk, feels like passing out now.      Moderate 5. ONSET:  "When did the dizziness begin?"     1 week ago 6. AGGRAVATING FACTORS: "Does anything make it worse?" (e.g., standing, change in head position)     Changing positions 7. HEART RATE: "Can you tell me your heart rate?" "How many beats in 15 seconds?"  (Note: not all patients can do this)       No 8. CAUSE: "What do you think is causing the dizziness?"     Unsure 9. RECURRENT SYMPTOM: "Have you had dizziness before?" If so, ask: "When was the last time?" "What happened that time?"     No 10. OTHER SYMPTOMS: "Do you have any other symptoms?" (e.g., fever, chest pain, vomiting, diarrhea, bleeding)       Headache 11. PREGNANCY: "Is there any chance you are pregnant?" "When was your last menstrual period?"       No  Protocols used: DIZZINESS [facility]

## 2019-10-18 NOTE — Telephone Encounter (Signed)
fyi  Refill was already sent to her local pharmacy earlier today re-sent to [facility]

## 2019-10-18 NOTE — Telephone Encounter (Signed)
If dizziness not resolving would recommend visit.

## 2019-11-29 ENCOUNTER — Ambulatory Visit: Payer: Medicare Other | Admitting: Internal Medicine

## 2019-11-29 NOTE — Progress Notes (Deleted)
Name: [name]  Age/ Sex: 73 y.o., female   MRN/ DOB: [id number], 1946/01/15     PCP: Myrlene Brokerrawford, [name], MD   Reason for Endocrinology Evaluation: Type 2 Diabetes Mellitus  Initial Endocrine Consultative Visit: 05/31/2019    PATIENT IDENTIFIER: [name] is a 73 y.o. female with a past medical history of T2DM, hyperlipidemia, IBS The patient has followed with Endocrinology clinic since 05/31/2019 for consultative assistance with management of her diabetes.  DIABETIC HISTORY:  [name] was diagnosed with T2D > 20 yrs ago. She has been on metformin for years, insulin introduced years ago with history of non-compliance. Her hemoglobin A1c has ranged from 7.7% in 2016, peaking at 15.3 %  in 2018.  On her initial visit to our clinic, her A1c was 10.3%, she was not taking insulin, we reduced metformin due to diarrhea with full dose.    Lives alone  SUBJECTIVE:   During the last visit (08/30/2019): A1c 12.1%.Continued metformin and increased Humalog Mix   Today (11/29/2019): [name] is here for a 3 month follow up on diabetes management.  She checks her blood sugars 0 times daily. Unaware of hypoglycemic episodes. Otherwise, the patient has not required any recent emergency interventions for hypoglycemia and has not had recent hospitalizations secondary to hyper or hypoglycemic episodes.    ROS: As per HPI and as detailed below: Review of Systems  Constitutional: Negative for chills and fever.  HENT: Negative for congestion and sore throat.   Respiratory: Negative for cough and shortness of breath.   Cardiovascular: Negative for chest pain and palpitations.  Gastrointestinal: Negative for diarrhea and nausea.  Musculoskeletal: Positive for joint pain and myalgias.      HOME DIABETES REGIMEN:  - Metformin 1000 mg Half a tablet with Breakfast and Supper - Humalog Mix 44 units BID with meals     METER DOWNLOAD SUMMARY: Did not bring     HISTORY:  Past Medical History:  Past Medical History:  Diagnosis Date  . Achilles tendinitis   . Acute renal failure (ARF) (HCC) 12/19/2014  . Calcaneal spur    right  . Cataract   . Depression   . Diabetes mellitus    type II  . Diverticulosis of colon (without mention of hemorrhage) 2013  . GERD (gastroesophageal reflux disease)   . GI bleed 03/26/14  . Hyperlipidemia   . Hypertension   . Internal hemorrhoids 2013  . Peripheral neuropathy   . Right shoulder pain    Subacromial tendinitis  . Venous insufficiency   . Ventral hernia    Past Surgical History:  Past Surgical History:  Procedure Laterality Date  . ABDOMINAL HYSTERECTOMY  2001   TAH-BSO  . CHOLECYSTECTOMY  2001  . COLONOSCOPY  2013   diverticulosis   . ESOPHAGOGASTRODUODENOSCOPY  2013   normal   . INCISIONAL HERNIA REPAIR    . TONSILLECTOMY      Social History:  reports that she has never smoked. She has never used smokeless tobacco. She reports that she does not drink alcohol or use drugs. Family History:  Family History  Problem Relation Age of Onset  . Heart failure Father        Died in 6380s  . CVA Father   . Ovarian cancer Sister   . Diabetes Sister   . Other Sister        died at birth  . Heart failure Mother        Died in 4180s  .  Other Brother        drowned  . Diabetes Paternal Grandmother   . Breast cancer Paternal Aunt   . Colon cancer Neg Hx      HOME MEDICATIONS: Allergies as of 11/29/2019      Reactions   Morphine Other (See Comments)   'took me out of this world' I had to be resuscitated   Codeine Sulfate Other (See Comments)   GI upset and pain   Demerol [meperidine] Nausea And Vomiting   Meperidine Hcl Nausea And Vomiting, Swelling   Propoxyphene Hcl Other (See Comments)   Darvocet caused sick headache      Medication List       Accurate as of November 29, 2019  8:00 AM. If you have any questions, ask your nurse or doctor.        ASPERCREME EX Apply 1  application topically daily as needed (shoulder/hips/knee/knuckles).   aspirin 81 MG tablet Take 81 mg by mouth daily.   citalopram 20 MG tablet Commonly known as: CELEXA Take 1 tablet (20 mg total) by mouth daily.   dicyclomine 20 MG tablet Commonly known as: BENTYL TAKE 1 TABLET (20 MG TOTAL) BY MOUTH 3 (THREE) TIMES DAILY BEFORE MEALS.   esomeprazole 20 MG capsule Commonly known as: NexIUM Take 1 capsule (20 mg total) by mouth daily at 12 noon.   ezetimibe 10 MG tablet Commonly known as: ZETIA Take 1 tablet (10 mg total) by mouth daily.   furosemide 40 MG tablet Commonly known as: LASIX Take 1 tablet (40 mg total) by mouth daily.   gabapentin 300 MG capsule Commonly known as: NEURONTIN Take 1 capsule (300 mg total) by mouth 3 (three) times daily.   Insulin Lispro Prot & Lispro (75-25) 100 UNIT/ML Kwikpen Commonly known as: HumaLOG Mix 75/25 KwikPen Inject 44 Units into the skin 2 (two) times daily.   Insulin Pen Needle 31G X 8 MM Misc Use to inject insulin four times daily E11.41   lisinopril 20 MG tablet Commonly known as: ZESTRIL Take 1 tablet (20 mg total) by mouth daily.   metFORMIN 1000 MG tablet Commonly known as: GLUCOPHAGE Take 0.5 tablets (500 mg total) by mouth 2 (two) times daily with a meal.   nitrofurantoin (macrocrystal-monohydrate) 100 MG capsule Commonly known as: Macrobid Take 1 capsule (100 mg total) by mouth 2 (two) times daily.   OneTouch Verio test strip Generic drug: glucose blood Use as instructed   sodium chloride 0.65 % Soln nasal spray Commonly known as: OCEAN Place 1 spray into both nostrils 4 (four) times daily. What changed:   when to take this  reasons to take this        OBJECTIVE:   Vital Signs: There were no vitals taken for this visit.  Wt Readings from Last 3 Encounters:  08/30/19 194 lb 3.2 oz (88.1 kg)  05/31/19 196 lb 9.6 oz (89.2 kg)  05/10/19 194 lb (88 kg)     Exam: General: Pt appears well and is  in NAD  Lungs: Clear with good BS bilat with no rales, rhonchi, or wheezes  Heart: RRR with normal S1 and S2 and no gallops; no murmurs; no rub  Abdomen: Normoactive bowel sounds, soft, nontender, without masses or organomegaly palpable  Extremities: No pretibial edema. No tremor.   Skin: Normal texture and temperature to palpation. No rash noted.  Neuro: MS is good with appropriate affect, pt is alert and Ox3    DM foot exam: 05/31/2019  The skin  of the feet is intact without sores or ulcerations. The pedal pulses are not detectable most likely due to edema The sensation is decreased to a screening 5.07, 10 gram monofilament bilaterally   DATA REVIEWED:  Lab Results  Component Value Date   HGBA1C 12.1 (A) 08/30/2019   HGBA1C 10.3 (A) 05/31/2019   HGBA1C 10.8 (H) 05/10/2019   Lab Results  Component Value Date   MICROALBUR 12.1 (H) 05/10/2019   LDLCALC 139 (H) 10/22/2013   CREATININE 0.63 05/10/2019   Lab Results  Component Value Date   MICRALBCREAT 21.8 05/10/2019     Lab Results  Component Value Date   CHOL 178 11/12/2018   HDL 33.40 (L) 11/12/2018   LDLCALC 139 (H) 10/22/2013   LDLDIRECT 110.0 11/12/2018   TRIG 224.0 (H) 11/12/2018   CHOLHDL 5 11/12/2018       In-Office Bg 365  ASSESSMENT / PLAN / RECOMMENDATIONS:   1) Type 2 Diabetes Mellitus, Poorly controlled, With Neuropathic complications - Most recent 1c of 12.1 %. Goal A1c <7.0  %.    - Pt most likely is not taking insulin as prescribed, pt denies missing any insulin injections but today she ate breakfast and did not take Novolog, with an in-office BG of 365 mg/dl . When asked why she didn't take her insulin this morning, she stated because she didn't feel good.  - Pt denies missing  insulin on her last visit but after calling her pharmacy, pt had no insulin pickup for over 6 months.  - Unfortunately pt will continue with hyperglycemia if she does not take her medications, we have enlisted [facility]'s help in  the past but there's only so much they can do, pt lives alone and does not have much of a family support .  - Pt does not check glucose due to pain   MEDICATIONS:  Continue  Metformin half a tablet with Breakfast and Supper - Increase Humalog Mix to 44 units with Breakfast and 44 units with Supper    EDUCATION / INSTRUCTIONS:  BG monitoring instructions: Patient is instructed to check her blood sugars 2 times a day, fasting and supper  Call [place] [facility] if: BG persistently < 70 or > 300. . I reviewed the Rule of 15 for the treatment of hypoglycemia in detail with the patient. Literature supplied.    F/U in 3 months    Signed electronically by: Mack Guise, MD  [facility] End[facility]  [facility] [address] [place], [place] [address] Phone: [phone] FAX: [phone]   CC: [address], [place] [address] Phone: [phone]  Fax: [phone]  Return to Endocrinology clinic as below: Future Appointments  Date Time Provider [place]  11/29/2019  9:50 AM Aylin Rhoads, Melanie Crazier, MD [facility] None

## 2019-12-06 ENCOUNTER — Other Ambulatory Visit: Payer: Self-pay | Admitting: Internal Medicine

## 2019-12-23 ENCOUNTER — Other Ambulatory Visit: Payer: Self-pay | Admitting: Internal Medicine

## 2019-12-23 DIAGNOSIS — Z1231 Encounter for screening mammogram for malignant neoplasm of breast: Secondary | ICD-10-CM

## 2020-01-28 ENCOUNTER — Ambulatory Visit
Admission: RE | Admit: 2020-01-28 | Discharge: 2020-01-28 | Disposition: A | Discharge status: Home / Self Care | Payer: Medicare Other | Source: Ambulatory Visit | Attending: Internal Medicine | Admitting: Internal Medicine

## 2020-01-28 ENCOUNTER — Other Ambulatory Visit: Payer: Self-pay

## 2020-01-28 DIAGNOSIS — Z1231 Encounter for screening mammogram for malignant neoplasm of breast: Secondary | ICD-10-CM | POA: Diagnosis not present

## 2020-02-03 ENCOUNTER — Ambulatory Visit: Payer: Medicare Other | Attending: Internal Medicine

## 2020-02-03 DIAGNOSIS — Z23 Encounter for immunization: Secondary | ICD-10-CM

## 2020-02-03 NOTE — Progress Notes (Signed)
   Covid-19 Vaccination Clinic  Name:  [name]    MRN: [id number] DOB: 1946/12/03  02/03/2020  [name] was observed post Covid-19 immunization for 15 minutes without incidence. She was provided with Vaccine Information Sheet and instruction to access the V-Safe system.   [name] was instructed to call 911 with any severe reactions post vaccine: Marland Kitchen Difficulty breathing  . Swelling of your face and throat  . A fast heartbeat  . A bad rash all over your body  . Dizziness and weakness    Immunizations Administered    Name Date Dose VIS Date Route   Pfizer COVID-19 Vaccine 02/03/2020  2:59 PM 0.3 mL 11/19/2019 Intramuscular   Manufacturer: [employer], [employer]   Lot: [id number]   NDC: [address]-1

## 2020-02-29 ENCOUNTER — Ambulatory Visit: Payer: Medicare Other | Attending: Internal Medicine

## 2020-02-29 DIAGNOSIS — Z23 Encounter for immunization: Secondary | ICD-10-CM

## 2020-03-05 ENCOUNTER — Other Ambulatory Visit: Payer: Self-pay | Admitting: Internal Medicine

## 2020-03-05 DIAGNOSIS — E1165 Type 2 diabetes mellitus with hyperglycemia: Secondary | ICD-10-CM

## 2020-03-05 DIAGNOSIS — IMO0002 Reserved for concepts with insufficient information to code with codable children: Secondary | ICD-10-CM

## 2020-03-05 DIAGNOSIS — E1149 Type 2 diabetes mellitus with other diabetic neurological complication: Secondary | ICD-10-CM

## 2020-03-07 ENCOUNTER — Telehealth: Payer: Self-pay

## 2020-03-07 DIAGNOSIS — E1165 Type 2 diabetes mellitus with hyperglycemia: Secondary | ICD-10-CM

## 2020-03-07 DIAGNOSIS — IMO0002 Reserved for concepts with insufficient information to code with codable children: Secondary | ICD-10-CM

## 2020-03-07 DIAGNOSIS — E1149 Type 2 diabetes mellitus with other diabetic neurological complication: Secondary | ICD-10-CM

## 2020-03-07 DIAGNOSIS — J302 Other seasonal allergic rhinitis: Secondary | ICD-10-CM

## 2020-03-07 MED ORDER — FUROSEMIDE 40 MG PO TABS: 40.0000 mg | ORAL_TABLET | Freq: Every day | ORAL | 3 refills | Status: DC

## 2020-03-07 MED ORDER — EZETIMIBE 10 MG PO TABS: 10.0000 mg | ORAL_TABLET | Freq: Every day | ORAL | 3 refills | Status: DC

## 2020-03-07 MED ORDER — METFORMIN HCL 1000 MG PO TABS: 500.0000 mg | ORAL_TABLET | Freq: Two times a day (BID) | ORAL | 3 refills | Status: DC

## 2020-03-07 MED ORDER — LISINOPRIL 20 MG PO TABS: 20.0000 mg | ORAL_TABLET | Freq: Every day | ORAL | 1 refills | Status: DC

## 2020-03-07 MED ORDER — ESOMEPRAZOLE MAGNESIUM 20 MG PO CPDR: 20.0000 mg | DELAYED_RELEASE_CAPSULE | Freq: Every day | ORAL | 3 refills | Status: AC

## 2020-03-07 MED ORDER — ONETOUCH VERIO VI STRP: ORAL_STRIP | 12 refills | Status: DC

## 2020-03-07 MED ORDER — GABAPENTIN 300 MG PO CAPS: 300.0000 mg | ORAL_CAPSULE | Freq: Three times a day (TID) | ORAL | 0 refills | Status: DC

## 2020-03-07 MED ORDER — NITROFURANTOIN MONOHYD MACRO 100 MG PO CAPS: 100.0000 mg | ORAL_CAPSULE | Freq: Two times a day (BID) | ORAL | 0 refills | Status: DC

## 2020-03-07 MED ORDER — SALINE SPRAY 0.65 % NA SOLN: 1.0000 | Freq: Four times a day (QID) | NASAL | 2 refills | Status: DC

## 2020-03-07 MED ORDER — CITALOPRAM HYDROBROMIDE 20 MG PO TABS: 20.0000 mg | ORAL_TABLET | Freq: Every day | ORAL | 1 refills | Status: DC

## 2020-03-07 MED ORDER — DICYCLOMINE HCL 20 MG PO TABS: ORAL_TABLET | ORAL | 3 refills | Status: DC

## 2020-03-07 NOTE — Telephone Encounter (Signed)
1.Medication Requested:  aspirin 81 MG tablet citalopram (CELEXA) 20 MG tablet dicyclomine (BENTYL) 20 MG tablet esomeprazole (NEXIUM) 20 MG capsule ezetimibe (ZETIA) 10 MG tablet furosemide (LASIX) 40 MG tablet gabapentin (NEURONTIN) 300 MG capsule glucose blood (ONETOUCH VERIO) test strip lisinopril (ZESTRIL) 20 MG tablet metFORMIN (GLUCOPHAGE) 1000 MG tablet nitrofurantoin, macrocrystal-monohydrate, (MACROBID) 100 MG capsule sodium chloride (OCEAN) 0.65 % SOLN nasal spray Trolamine Salicylate (ASPERCREME EX)  2. Pharmacy (Name, Street, [place]):[facility] - [place], [place] - [address]  3. On Med List: Yes   4. Last Visit with PCP: 10.20.20  5. Next visit date with PCP: 4.12.21    Agent: Please be advised that RX refills may take up to 3 business days. We ask that you follow-up with your pharmacy.

## 2020-03-16 LAB — HM DIABETES EYE EXAM

## 2020-03-20 ENCOUNTER — Other Ambulatory Visit: Payer: Self-pay

## 2020-03-20 ENCOUNTER — Ambulatory Visit (INDEPENDENT_AMBULATORY_CARE_PROVIDER_SITE_OTHER): Payer: Medicare Other | Admitting: Internal Medicine

## 2020-03-20 ENCOUNTER — Encounter: Payer: Self-pay | Admitting: Internal Medicine

## 2020-03-20 VITALS — BP 132/70 | HR 90 | Temp 99.6°F | Ht 64.0 in | Wt 193.0 lb

## 2020-03-20 DIAGNOSIS — E1149 Type 2 diabetes mellitus with other diabetic neurological complication: Secondary | ICD-10-CM | POA: Diagnosis not present

## 2020-03-20 DIAGNOSIS — E1165 Type 2 diabetes mellitus with hyperglycemia: Secondary | ICD-10-CM

## 2020-03-20 DIAGNOSIS — I10 Essential (primary) hypertension: Secondary | ICD-10-CM

## 2020-03-20 DIAGNOSIS — E1142 Type 2 diabetes mellitus with diabetic polyneuropathy: Secondary | ICD-10-CM

## 2020-03-20 DIAGNOSIS — R6889 Other general symptoms and signs: Secondary | ICD-10-CM | POA: Diagnosis not present

## 2020-03-20 DIAGNOSIS — IMO0002 Reserved for concepts with insufficient information to code with codable children: Secondary | ICD-10-CM

## 2020-03-20 LAB — CBC
HCT: 44.7 % (ref 36.0–46.0)
Hemoglobin: 15.4 g/dL — ABNORMAL HIGH (ref 12.0–15.0)
MCHC: 34.3 g/dL (ref 30.0–36.0)
MCV: 82.4 fl (ref 78.0–100.0)
Platelets: 259 10*3/uL (ref 150.0–400.0)
RBC: 5.42 Mil/uL — ABNORMAL HIGH (ref 3.87–5.11)
RDW: 13.5 % (ref 11.5–15.5)
WBC: 8.2 10*3/uL (ref 4.0–10.5)

## 2020-03-20 LAB — COMPREHENSIVE METABOLIC PANEL
ALT: 20 U/L (ref 0–35)
AST: 17 U/L (ref 0–37)
Albumin: 3.8 g/dL (ref 3.5–5.2)
Alkaline Phosphatase: 129 U/L — ABNORMAL HIGH (ref 39–117)
BUN: 16 mg/dL (ref 6–23)
CO2: 30 mEq/L (ref 19–32)
Calcium: 9.5 mg/dL (ref 8.4–10.5)
Chloride: 95 mEq/L — ABNORMAL LOW (ref 96–112)
Creatinine, Ser: 0.74 mg/dL (ref 0.40–1.20)
GFR: 76.91 mL/min (ref >60.00)
Glucose, Bld: 362 mg/dL — ABNORMAL HIGH (ref 70–99)
Potassium: 4.9 mEq/L (ref 3.5–5.1)
Sodium: 131 mEq/L — ABNORMAL LOW (ref 135–145)
Total Bilirubin: 0.6 mg/dL (ref 0.2–1.2)
Total Protein: 7.6 g/dL (ref 6.0–8.3)

## 2020-03-20 LAB — LIPID PANEL
Cholesterol: 194 mg/dL (ref 0–200)
HDL: 36.3 mg/dL — ABNORMAL LOW (ref >39.00)
LDL Cholesterol: 124 mg/dL — ABNORMAL HIGH (ref 0–99)
NonHDL: 158.09
Total CHOL/HDL Ratio: 5
Triglycerides: 170 mg/dL — ABNORMAL HIGH (ref 0.0–149.0)
VLDL: 34 mg/dL (ref 0.0–40.0)

## 2020-03-20 LAB — MICROALBUMIN / CREATININE URINE RATIO
Creatinine,U: 89.8 mg/dL
Microalb Creat Ratio: 22.7 mg/g (ref 0.0–30.0)
Microalb, Ur: 20.4 mg/dL — ABNORMAL HIGH (ref 0.0–1.9)

## 2020-03-20 LAB — POCT GLYCOSYLATED HEMOGLOBIN (HGB A1C)
HbA1c POC (<> result, manual entry): 11.2 % (ref 4.0–5.6)
HbA1c, POC (controlled diabetic range): 11.2 % — AB (ref 0.0–7.0)
HbA1c, POC (prediabetic range): 11.2 % — AB (ref 5.7–6.4)
Hemoglobin A1C: 11.2 % — AB (ref 4.0–5.6)

## 2020-03-20 MED ORDER — EZETIMIBE 10 MG PO TABS: 10.0000 mg | ORAL_TABLET | Freq: Every day | ORAL | 3 refills | Status: DC

## 2020-03-20 MED ORDER — FREESTYLE LIBRE SENSOR SYSTEM MISC: 0 refills | Status: DC

## 2020-03-20 NOTE — Patient Instructions (Addendum)
We will increase the gabapentin medicine to 2 pills in the morning, 2 pills in the afternoon and 2 pills in the evening to see if this helps the pain better.    Diabetes Mellitus and Standards of Medical Care Managing diabetes (diabetes mellitus) can be complicated. Your diabetes treatment may be managed by a team of health care providers, including:  A physician who specializes in diabetes (endocrinologist).  A nurse practitioner or physician assistant.  Nurses.  A diet and nutrition specialist (registered dietitian).  A certified diabetes educator (CDE).  An exercise specialist.  A pharmacist.  An eye doctor.  A foot specialist (podiatrist).  A dentist.  A primary care provider.  A mental health provider. Your health care providers follow guidelines to help you get the best quality of care. The following schedule is a general guideline for your diabetes management plan. Your health care providers may give you more specific instructions. Physical exams Upon being diagnosed with diabetes mellitus, and each year after that, your health care provider will ask about your medical and family history. He or she will also do a physical exam. Your exam may include:  Measuring your height, weight, and body mass index (BMI).  Checking your blood pressure. This will be done at every routine medical visit. Your target blood pressure may vary depending on your medical conditions, your age, and other factors.  Thyroid gland exam.  Skin exam.  Screening for damage to your nerves (peripheral neuropathy). This may include checking the pulse in your legs and feet and checking the level of sensation in your hands and feet.  A complete foot exam to inspect the structure and skin of your feet, including checking for cuts, bruises, redness, blisters, sores, or other problems.  Screening for blood vessel (vascular) problems, which may include checking the pulse in your legs and feet and  checking your temperature. Blood tests Depending on your treatment plan and your personal needs, you may have the following tests done:  HbA1c (hemoglobin A1c). This test provides information about blood sugar (glucose) control over the previous 2-3 months. It is used to adjust your treatment plan, if needed. This test will be done: ? At least 2 times a year, if you are meeting your treatment goals. ? 4 times a year, if you are not meeting your treatment goals or if treatment goals have changed.  Lipid testing, including total, LDL, and HDL cholesterol and triglyceride levels. ? The goal for LDL is less than 100 mg/dL (5.5 mmol/L). If you are at high risk for complications, the goal is less than 70 mg/dL (3.9 mmol/L). ? The goal for HDL is 40 mg/dL (2.2 mmol/L) or higher for men and 50 mg/dL (2.8 mmol/L) or higher for women. An HDL cholesterol of 60 mg/dL (3.3 mmol/L) or higher gives some protection against heart disease. ? The goal for triglycerides is less than 150 mg/dL (8.3 mmol/L).  Liver function tests.  Kidney function tests.  Thyroid function tests. Dental and eye exams  Visit your dentist two times a year.  If you have type 1 diabetes, your health care provider may recommend an eye exam 3-5 years after you are diagnosed, and then once a year after your first exam. ? For children with type 1 diabetes, a health care provider may recommend an eye exam when your child is age 29 or older and has had diabetes for 3-5 years. After the first exam, your child should get an eye exam once a year.  If you have type 2 diabetes, your health care provider may recommend an eye exam as soon as you are diagnosed, and then once a year after your first exam. Immunizations   The yearly flu (influenza) vaccine is recommended for everyone 6 months or older who has diabetes.  The pneumonia (pneumococcal) vaccine is recommended for everyone 2 years or older who has diabetes. If you are 38 or older,  you may get the pneumonia vaccine as a series of two separate shots.  The hepatitis B vaccine is recommended for adults shortly after being diagnosed with diabetes.  Adults and children with diabetes should receive all other vaccines according to age-specific recommendations from the Centers for Disease Control and Prevention (CDC). Mental and emotional health Screening for symptoms of eating disorders, anxiety, and depression is recommended at the time of diagnosis and afterward as needed. If your screening shows that you have symptoms (positive screening result), you may need more evaluation and you may work with a mental health care provider. Treatment plan Your treatment plan will be reviewed at every medical visit. You and your health care provider will discuss:  How you are taking your medicines, including insulin.  Any side effects you are experiencing.  Your blood glucose target goals.  The frequency of your blood glucose monitoring.  Lifestyle habits, such as activity level as well as tobacco, alcohol, and substance use. Diabetes self-management education Your health care provider will assess how well you are monitoring your blood glucose levels and whether you are taking your insulin correctly. He or she may refer you to:  A certified diabetes educator to manage your diabetes throughout your life, starting at diagnosis.  A registered dietitian who can create or review your personal nutrition plan.  An exercise specialist who can discuss your activity level and exercise plan. Summary  Managing diabetes (diabetes mellitus) can be complicated. Your diabetes treatment may be managed by a team of health care providers.  Your health care providers follow guidelines in order to help you get the best quality of care.  Standards of care including having regular physical exams, blood tests, blood pressure monitoring, immunizations, screening tests, and education about how to manage  your diabetes.  Your health care providers may also give you more specific instructions based on your individual health. This information is not intended to replace advice given to you by your health care provider. Make sure you discuss any questions you have with your health care provider. Document Revised: 08/14/2018 Document Reviewed: 08/23/2016 Elsevier Patient Education  [place].

## 2020-03-20 NOTE — Assessment & Plan Note (Signed)
Worsening with persistent poor control of her diabetes, will increase gabapentin to 600 mg TID and she has enough to try this for awhile and will let us know if effective and we can switch dosing.

## 2020-03-20 NOTE — Progress Notes (Signed)
   Subjective:   Patient ID: [name], female    DOB: [birth year], 74 y.o.   MRN: [id number]  HPI The patient is a 74 YO female coming in for worsening neuropathy (overall worsening in hands and feet, struggles with cooking due to lack of feeling and pain with doing much of anything, she does not have a good support network, is now eating more frozen meals due to convenience, taking gabapentin 300 mg TID and feels that is not enough anymore) and diabetes (not checking sugars much, denies low sugars, sometimes struggles with her regimen, states insulin is not too expensive, wants to try continuous glucose monitoring with her severe neuropathy it is very difficult for her to use meter at home, is on insulin BID, denies low sugars or symptoms of low sugars) and blood pressure (taking lasix and lisinopril, denies side effects, denies chest pains or headaches).   Review of Systems  Constitutional: Negative.   HENT: Negative.   Eyes: Negative.   Respiratory: Negative for cough, chest tightness and shortness of breath.   Cardiovascular: Negative for chest pain, palpitations and leg swelling.  Gastrointestinal: Negative for abdominal distention, abdominal pain, constipation, diarrhea, nausea and vomiting.  Musculoskeletal: Positive for gait problem and myalgias. Negative for neck pain and neck stiffness.  Skin: Negative.   Neurological: Positive for numbness.  Psychiatric/Behavioral: Negative.     Objective:  Physical Exam Constitutional:      Appearance: She is well-developed. She is obese.  HENT:     Head: Normocephalic and atraumatic.  Cardiovascular:     Rate and Rhythm: Normal rate and regular rhythm.  Pulmonary:     Effort: Pulmonary effort is normal. No respiratory distress.     Breath sounds: Normal breath sounds. No wheezing or rales.  Abdominal:     General: Bowel sounds are normal. There is no distension.     Palpations: Abdomen is soft.     Tenderness: There is no  abdominal tenderness. There is no rebound.  Musculoskeletal:     Cervical back: Normal range of motion.     Right lower leg: Edema present.     Left lower leg: Edema present.  Skin:    General: Skin is warm and dry.  Neurological:     Mental Status: She is alert and oriented to person, place, and time. Mental status is at baseline.     Cranial Nerves: Cranial nerve deficit present.     Coordination: Coordination normal.     Comments: Foot exam done, with neuropathy     Vitals:   03/20/20 1131  BP: 132/70  Pulse: 90  Temp: 99.6 F (37.6 C)  SpO2: 99%  Weight: 193 lb (87.5 kg)  Height: 5\' 4"  (1.626 m)    This visit occurred during the SARS-CoV-2 public health emergency.  Safety protocols were in place, including screening questions prior to the visit, additional usage of staff PPE, and extensive cleaning of exam room while observing appropriate contact time as indicated for disinfecting solutions.   Assessment & Plan:

## 2020-03-20 NOTE — Assessment & Plan Note (Addendum)
HgA1c done in office 11.2 and this is poor control. Checking lipid panel and microalbumin to creatinine ratio. Suspect compliance is an issue due to neuropathy and she does not check sugars as often as advised. Will try freestyle libre to see if this can be approved. She states a picture of her eyes taken at home by insurance and she does not have results yet.

## 2020-03-20 NOTE — Assessment & Plan Note (Signed)
BP at goal on lisinopril and lasix. Checking CMP and adjust as needed.

## 2020-03-24 NOTE — Progress Notes (Signed)
Virtual Visit via Telephone Note   This visit type was conducted due to national recommendations for restrictions regarding the COVID-19 Pandemic (e.g. social distancing) in an effort to limit this patient's exposure and mitigate transmission in our community.  Due to her co-morbid illnesses, this patient is at least at moderate risk for complications without adequate follow up.  This format is felt to be most appropriate for this patient at this time.  The patient did not have access to video technology/had technical difficulties with video requiring transitioning to audio format only (telephone).  All issues noted in this document were discussed and addressed.  No physical exam could be performed with this format.  Please refer to the patient's chart for her  consent to telehealth for [facility]. Virtual platform was offered given ongoing worsening Covid-19 pandemic.  Date:  03/27/2020   ID:  [name], DOB [birth year], MRN [id number]  Patient Location: Home Provider Location: [facility]  PCP:  Myrlene Broker, MD  Cardiologist:  Chilton Si, MD  Electrophysiologist:  None   Evaluation Performed:  Follow-Up Visit  Chief Complaint:  follow-up for hypertension, hyperlipidemia, and back pain (possible anginal equivalent)  History of Present Illness:    [name] is a 74 y.o. female with a history of hypertension, hyperlipidemia, type 2 diabetes mellitus, GERD, and venous insufficiency who is a patient of Dr. Duke Salvia and presents today for follow-up.   Patient referred to Dr. Duke Salvia by PCP in 10/2015 for further evaluation of chest pain. Prior to visit, patient was seen by PCP for exertional chest pain. EKG showed sinus rhythm with LAFB. However, at visit with Dr. Duke Salvia, patient denied chest pain but reported back pain that radiated up to her head. Back pain was felt to be very atypical and unlikely to be due to ischemia; however, given  multiple risk factors Lexiscan Cardiolite was ordered. This was performed on 11/14/2015 and was low risk with no evidence of ischemia or infarction. Patient was instructed to follow-up in 6 months. However, she has not been seen since that time.   Patient recently seen by PCP on 03/20/2020 for uncontrolled type 2 diabetes with worsening neuropathy. Hemoglobin A1c 11.2 at that time.    Patient presents today for follow-up via telephone visit. She states she has been doing pretty well from a cardiac standpoint since her last visit. She continues to have back/neck pain but states this is because she re-injured her back in January. No chest pain or shortness of breath. She has chronic lower extremity edema but states it has been getting worse lately. Sounds like she is eating a high sodium diet - she eats a lot of frozen meals because she does not like to cook. She states she also only has 7 teeth so it is hard for her to eat frozen foods. No orthopnea or PND. She notes feelings of heart racing during stressful family situations. She has had 8 deaths in the family over the last year and notes palpitations during funerals. She does not usually have any palpitations outside stressful events. She does note some dizziness with quick position changes but no falls or syncope.   Past Medical History:  Diagnosis Date  . Achilles tendinitis   . Acute renal failure (ARF) (HCC) 12/19/2014  . Calcaneal spur    right  . Cataract   . Depression   . Diabetes mellitus    type II  . Diverticulosis of colon (without mention of hemorrhage) 2013  .  GERD (gastroesophageal reflux disease)   . GI bleed 03/26/14  . Hyperlipidemia   . Hypertension   . Internal hemorrhoids 2013  . Peripheral neuropathy   . Right shoulder pain    Subacromial tendinitis  . Venous insufficiency   . Ventral hernia    Past Surgical History:  Procedure Laterality Date  . ABDOMINAL HYSTERECTOMY  2001   TAH-BSO  . CHOLECYSTECTOMY  2001  .  COLONOSCOPY  2013   diverticulosis   . ESOPHAGOGASTRODUODENOSCOPY  2013   normal   . INCISIONAL HERNIA REPAIR    . TONSILLECTOMY       Current Meds  Medication Sig  . aspirin 81 MG tablet Take 81 mg by mouth daily.  . citalopram (CELEXA) 20 MG tablet Take 1 tablet (20 mg total) by mouth daily.  . Continuous Blood Gluc Sensor (FREESTYLE LIBRE SENSOR SYSTEM) MISC Use to monitor sugars  . esomeprazole (NEXIUM) 20 MG capsule Take 1 capsule (20 mg total) by mouth daily at 12 noon.  . ezetimibe (ZETIA) 10 MG tablet Take 1 tablet (10 mg total) by mouth daily.  . furosemide (LASIX) 40 MG tablet Take 1 tablet (40 mg total) by mouth daily.  Marland Kitchen gabapentin (NEURONTIN) 300 MG capsule Take 1 capsule (300 mg total) by mouth 3 (three) times daily.  Marland Kitchen glucose blood (ONETOUCH VERIO) test strip Use as instructed  . Insulin Lispro Prot & Lispro (HUMALOG MIX 75/25 KWIKPEN) (75-25) 100 UNIT/ML Kwikpen Inject 44 Units into the skin 2 (two) times daily.  . Insulin Pen Needle 31G X 8 MM MISC Use to inject insulin four times daily E11.41  . lisinopril (ZESTRIL) 20 MG tablet Take 1 tablet (20 mg total) by mouth daily.  . metFORMIN (GLUCOPHAGE) 1000 MG tablet Take 0.5 tablets (500 mg total) by mouth 2 (two) times daily with a meal.  . sodium chloride (OCEAN) 0.65 % SOLN nasal spray Place 1 spray into both nostrils 4 (four) times daily.  Terrance Mass Salicylate (ASPERCREME EX) Apply 1 application topically daily as needed (shoulder/hips/knee/knuckles).     Allergies:   Morphine, Codeine sulfate, Demerol [meperidine], Meperidine hcl, and Propoxyphene hcl   Social History   Tobacco Use  . Smoking status: Never Smoker  . Smokeless tobacco: Never Used  Substance Use Topics  . Alcohol use: No  . Drug use: No     Family Hx: The patient's family history includes Breast cancer in her paternal aunt; CVA in her father; Diabetes in her paternal grandmother and sister; Heart failure in her father and mother; Other in  her brother and sister; Ovarian cancer in her sister. There is no history of Colon cancer.  ROS:   Please see the history of present illness.      Prior CV studies:    The following studies were reviewed today:   Lexiscan Myoview 11/14/2015:  The left ventricular ejection fraction is normal (55-65%).  Nuclear stress EF: 60%.  There was no ST segment deviation noted during stress.  The study is normal.   Normal stress nuclear study with no ischemia or infarction; EF 60 with normal wall motion.  Labs/Other Tests and Data Reviewed:    EKG: Last EKG from 10/27/2015: Normal sinus rhythm, rate 84 bpm, with non-specific T wave changes in lead III. LAFB. Normal PR and QRS interval. QTc 423 ms.   Recent Labs: 05/10/2019: Pro B Natriuretic peptide (BNP) 100.0 03/20/2020: ALT 20; BUN 16; Creatinine, Ser 0.74; Hemoglobin 15.4; Platelets 259.0; Potassium 4.9; Sodium 131  Recent Lipid Panel Lab Results  Component Value Date/Time   CHOL 194 03/20/2020 11:57 AM   TRIG 170.0 (H) 03/20/2020 11:57 AM   HDL 36.30 (L) 03/20/2020 11:57 AM   CHOLHDL 5 03/20/2020 11:57 AM   LDLCALC 124 (H) 03/20/2020 11:57 AM   LDLDIRECT 110.0 11/12/2018 02:19 PM    Wt Readings from Last 3 Encounters:  03/27/20 196 lb (88.9 kg)  03/20/20 193 lb (87.5 kg)  08/30/19 194 lb 3.2 oz (88.1 kg)     Objective:    Vital Signs:  Ht 5\' 4"  (1.626 m)   Wt 196 lb (88.9 kg)   BMI 33.64 kg/m    VS Reviewed. Unable to get home BP machine to work this morning.  General: No acute distress. Pulm: No labored breathing. No coughing during visit. No audible wheezing. Speaking in full sentences. Neuro: Alert and oriented. No slurred speech. Answers questions appropriately. Psych: Pleasant affect.   ASSESSMENT & PLAN:    Chronic Venous Insufficiency - Patient notes worsening lower extremity edema. No other symptoms of CHF.  - Increase Lasix to 40mg  twice daily x3 days and then go back to 40mg  daily. Potassium 4.9 on CMET  on 03/20/2020. No history of hypokalemia. Therefore, I think she will do OK on short duration of higher dose Lasix without potassium supplementation.  - Patient endorses high sodium diet so suspect this is playing a large role. Emphasized importance of reducing sodium intake. Also recommended elevating legs and trying compression stockings if able.  - Patient to contact us if swelling does not improve or worsens.   Palpitations - She notes palpitations during stress family situation, such as funerals. No palpitations outside of events like this. No lightheadedness, dizziness, or syncope. - OK to continue to monitor for now. However, if this worsens or becomes more frequent, will likely need outpatient monitor for further evaluation.   Hypertension - Patient unable to get home BP machine to work this morning; however, BP a few days ago was 130/70 at home. BP at recent visit with PCP was 132/70.  - Continue Lisinopril 20mg  daily.  - Of note, she does note some lightheadedness when standing to quickly which sounds like orthostasis. Advised patient to change positions slowly.   Hyperlipidemia - Recent lipid panel from 03/20/2020: Total Cholesterol 194, Triglycerides 170, HDL 36, LDL 124.  - Managed by PCP - PCP recommend starting something from cholesterol and I agree. 10 year ASCVD risk >7.5% so should be on a high-intensity statin. Will start Lipitor 40mg  daily. Also already on Zetia 10mg  daily.  - Will recheck lipid panel and LFTs in 6-8 weeks.   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  - Emphasized importance of heart healthy diet and getting diabetes well under control.  - Managed by PCP.   Back Pain - Seen in 10/2015 for back pain. Anginal equivalent/ischemia felt to be very unlikely; however, given several CV risk factors, Myoview was ordered and was low risk with no evidence of ischemia.  - She continues to have back pain but states she  re-injured her back in January of this years. Does not sound like anginal equivalent.  - Follow-up with PCP as needed.   Time:   Today, I have spent 22 minutes with the patient with telehealth technology discussing the above problems.     Medication Adjustments/Labs and Tests Ordered: Current medicines are reviewed at length with the patient today.  Concerns regarding medicines are outlined above.  Follow Up:  In Person in 2-3 month(s) with Dr. Duke Salvia given she has not been seen since 2016.   Signed, Corrin Parker, PA-C  03/27/2020 1:01 PM     [facility]

## 2020-03-27 ENCOUNTER — Telehealth (INDEPENDENT_AMBULATORY_CARE_PROVIDER_SITE_OTHER): Payer: Medicare Other | Admitting: Student

## 2020-03-27 ENCOUNTER — Encounter: Payer: Self-pay | Admitting: Student

## 2020-03-27 VITALS — Ht 64.0 in | Wt 196.0 lb

## 2020-03-27 DIAGNOSIS — R002 Palpitations: Secondary | ICD-10-CM | POA: Diagnosis not present

## 2020-03-27 DIAGNOSIS — I872 Venous insufficiency (chronic) (peripheral): Secondary | ICD-10-CM

## 2020-03-27 DIAGNOSIS — E785 Hyperlipidemia, unspecified: Secondary | ICD-10-CM

## 2020-03-27 DIAGNOSIS — I10 Essential (primary) hypertension: Secondary | ICD-10-CM | POA: Diagnosis not present

## 2020-03-27 DIAGNOSIS — Z794 Long term (current) use of insulin: Secondary | ICD-10-CM

## 2020-03-27 DIAGNOSIS — M549 Dorsalgia, unspecified: Secondary | ICD-10-CM

## 2020-03-27 DIAGNOSIS — E1140 Type 2 diabetes mellitus with diabetic neuropathy, unspecified: Secondary | ICD-10-CM

## 2020-03-27 DIAGNOSIS — E119 Type 2 diabetes mellitus without complications: Secondary | ICD-10-CM

## 2020-03-27 MED ORDER — ATORVASTATIN CALCIUM 40 MG PO TABS: 40.0000 mg | ORAL_TABLET | Freq: Every day | ORAL | 3 refills | Status: DC

## 2020-03-27 NOTE — Patient Instructions (Addendum)
Medication Instructions:  INCREASE furosemide (Lasix) to 40 mg TWO TIMES daily x 3 days --then resume 40 mg once daily  START atorvastatin (Lipitor) 40 mg daily  *If you need a refill on your cardiac medications before your next appointment, please call your pharmacy*  Labs: Please return for FASTING labs in 2 months (Lipid, Hepatic)  Our in office lab hours are Monday-Friday 8:00-4:00, closed for lunch 12:45-1:45 pm.  No appointment needed.  Follow-Up: At [facility], you and your health needs are our priority.  As part of our continuing mission to provide you with exceptional heart care, we have created designated Provider Care Teams.  These Care Teams include your primary Cardiologist (physician) and Advanced Practice Providers (APPs -  Physician Assistants and Nurse Practitioners) who all work together to provide you with the care you need, when you need it.  We recommend signing up for the patient portal called "MyChart".  Sign up information is provided on this After Visit Summary.  MyChart is used to connect with patients for Virtual Visits (Telemedicine).  Patients are able to view lab/test results, encounter notes, upcoming appointments, etc.  Non-urgent messages can be sent to your provider as well.   To learn more about what you can do with MyChart, go to [email or web address].    Your next appointment:   2-3 month(s)  The format for your next appointment:   In Person  Provider:   Chilton Si, MD   Other Instructions Please follow a low sodium diet Elevated lower extremities  Use compression stockings if possible   How to Use Compression Stockings Compression stockings are elastic socks that squeeze the legs. They help increase blood flow (circulation) to the legs, decrease swelling in the legs, and reduce the chance of developing blood clots in the lower legs. Compression stockings are often used by people who:  Are recovering from surgery.  Have poor  circulation in their legs.  Tend to get blood clots in their legs.  Have bulging (varicose) veins.  Sit or stay in bed for long periods of time. Follow instructions from your health care provider about how and when to wear your compression stockings. How to wear compression stockings Before you put on your compression stockings:  Make sure that they are the correct size and degree of compression. If you do not know your size or required grade of compression, ask your health care provider and follow the manufacturer's instructions that come with the stockings.  Make sure that they are clean, dry, and in good condition.  Check them for rips and tears. Do not put them on if they are ripped or torn. Put your stockings on first thing in the morning, before you get out of bed. Keep them on for as long as your health care provider advises. When you are wearing your stockings:  Keep them as smooth as possible. Do not allow them to bunch up. It is especially important to prevent the stockings from bunching up around your toes or behind your knees.  Do not roll the stockings downward and leave them rolled down. This can decrease blood flow to your leg.  Change them right away if they become wet or dirty. When you take off your stockings, inspect your legs and feet. Check for:  Open sores.  Red spots.  Swelling. General tips  Do not stop wearing compression stockings without talking to your health care provider first.  Wash your stockings every day with mild detergent in cold or warm  water. Do not use bleach. Air-dry your stockings or dry them in a clothes dryer on low heat. It may be helpful to have two pairs so that you have a pair to wear while the other is being washed.  Replace your stockings every 3-6 months.  If skin moisturizing is part of your treatment plan, apply lotion or cream at night so that your skin will be dry when you put on the stockings in the morning. It is harder to  put the stockings on when you have lotion on your legs or feet.  Wear nonskid shoes or slip-resistant socks when walking while wearing compression stockings. Contact a health care provider and remove your stockings if you have:  A feeling of pins and needles in your feet or legs.  Open sores, red spots, or other skin changes on your feet or legs.  Swelling or pain that gets worse. Get help right away if you have:  Numbness or tingling in your lower legs that does not get better right after you take the stockings off.  Toes or feet that are unusually cold or turn a bluish color.  A warm or red area on your leg.  New swelling or soreness in your leg.  Shortness of breath.  Chest pain.  A fast or irregular heartbeat.  Light-headedness.  Dizziness. Summary  Compression stockings are elastic socks that squeeze the legs.  They help increase blood flow (circulation) to the legs, decrease swelling in the legs, and reduce the chance of developing blood clots in the lower legs.  Follow instructions from your health care provider about how and when to wear your compression stockings.  Do not stop wearing your compression stockings without talking to your health care provider first. This information is not intended to replace advice given to you by your health care provider. Make sure you discuss any questions you have with your health care provider. Document Revised: 11/27/2017 Document Reviewed: 11/27/2017 Elsevier Patient Education  [place].  Low-Sodium Eating Plan Sodium, which is an element that makes up salt, helps you maintain a healthy balance of fluids in your body. Too much sodium can increase your blood pressure and cause fluid and waste to be held in your body. Your health care provider or dietitian may recommend following this plan if you have high blood pressure (hypertension), kidney disease, liver disease, or heart failure. Eating less sodium can help lower  your blood pressure, reduce swelling, and protect your heart, liver, and kidneys. What are tips for following this plan? General guidelines  Most people on this plan should limit their sodium intake to 1,500-2,000 mg (milligrams) of sodium each day. Reading food labels   The Nutrition Facts label lists the amount of sodium in one serving of the food. If you eat more than one serving, you must multiply the listed amount of sodium by the number of servings.  Choose foods with less than 140 mg of sodium per serving.  Avoid foods with 300 mg of sodium or more per serving. Shopping  Look for lower-sodium products, often labeled as "low-sodium" or "no salt added."  Always check the sodium content even if foods are labeled as "unsalted" or "no salt added".  Buy fresh foods. ? Avoid canned foods and premade or frozen meals. ? Avoid canned, cured, or processed meats  Buy breads that have less than 80 mg of sodium per slice. Cooking  Eat more home-cooked food and less restaurant, buffet, and fast food.  Avoid  adding salt when cooking. Use salt-free seasonings or herbs instead of table salt or sea salt. Check with your health care provider or pharmacist before using salt substitutes.  Cook with plant-based oils, such as canola, sunflower, or olive oil. Meal planning  When eating at a restaurant, ask that your food be prepared with less salt or no salt, if possible.  Avoid foods that contain MSG (monosodium glutamate). MSG is sometimes added to [place] food, bouillon, and some canned foods. What foods are recommended? The items listed may not be a complete list. Talk with your dietitian about what dietary choices are best for you. Grains Low-sodium cereals, including oats, puffed wheat and rice, and shredded wheat. Low-sodium crackers. Unsalted rice. Unsalted pasta. Low-sodium bread. Whole-grain breads and whole-grain pasta. Vegetables Fresh or frozen vegetables. "No salt added" canned  vegetables. "No salt added" tomato sauce and paste. Low-sodium or reduced-sodium tomato and vegetable juice. Fruits Fresh, frozen, or canned fruit. Fruit juice. Meats and other protein foods Fresh or frozen (no salt added) meat, poultry, seafood, and fish. Low-sodium canned tuna and salmon. Unsalted nuts. Dried peas, beans, and lentils without added salt. Unsalted canned beans. Eggs. Unsalted nut butters. Dairy Milk. Soy milk. Cheese that is naturally low in sodium, such as ricotta cheese, fresh mozzarella, or Swiss cheese Low-sodium or reduced-sodium cheese. Cream cheese. Yogurt. Fats and oils Unsalted butter. Unsalted margarine with no trans fat. Vegetable oils such as canola or olive oils. Seasonings and other foods Fresh and dried herbs and spices. Salt-free seasonings. Low-sodium mustard and ketchup. Sodium-free salad dressing. Sodium-free light mayonnaise. Fresh or refrigerated horseradish. Lemon juice. Vinegar. Homemade, reduced-sodium, or low-sodium soups. Unsalted popcorn and pretzels. Low-salt or salt-free chips. What foods are not recommended? The items listed may not be a complete list. Talk with your dietitian about what dietary choices are best for you. Grains Instant hot cereals. Bread stuffing, pancake, and biscuit mixes. Croutons. Seasoned rice or pasta mixes. Noodle soup cups. Boxed or frozen macaroni and cheese. Regular salted crackers. Self-rising flour. Vegetables Sauerkraut, pickled vegetables, and relishes. Olives. [place] fries. Onion rings. Regular canned vegetables (not low-sodium or reduced-sodium). Regular canned tomato sauce and paste (not low-sodium or reduced-sodium). Regular tomato and vegetable juice (not low-sodium or reduced-sodium). Frozen vegetables in sauces. Meats and other protein foods Meat or fish that is salted, canned, smoked, spiced, or pickled. Bacon, ham, sausage, hotdogs, corned beef, chipped beef, packaged lunch meats, salt pork, jerky, pickled  herring, anchovies, regular canned tuna, sardines, salted nuts. Dairy Processed cheese and cheese spreads. Cheese curds. Blue cheese. Feta cheese. String cheese. Regular cottage cheese. Buttermilk. Canned milk. Fats and oils Salted butter. Regular margarine. Ghee. Bacon fat. Seasonings and other foods Onion salt, garlic salt, seasoned salt, table salt, and sea salt. Canned and packaged gravies. Worcestershire sauce. Tartar sauce. Barbecue sauce. Teriyaki sauce. Soy sauce, including reduced-sodium. Steak sauce. Fish sauce. Oyster sauce. Cocktail sauce. Horseradish that you find on the shelf. Regular ketchup and mustard. Meat flavorings and tenderizers. Bouillon cubes. Hot sauce and Tabasco sauce. Premade or packaged marinades. Premade or packaged taco seasonings. Relishes. Regular salad dressings. Salsa. Potato and tortilla chips. Corn chips and puffs. Salted popcorn and pretzels. Canned or dried soups. Pizza. Frozen entrees and pot pies. Summary  Eating less sodium can help lower your blood pressure, reduce swelling, and protect your heart, liver, and kidneys.  Most people on this plan should limit their sodium intake to 1,500-2,000 mg (milligrams) of sodium each day.  Canned, boxed, and frozen foods  are high in sodium. Restaurant foods, fast foods, and pizza are also very high in sodium. You also get sodium by adding salt to food.  Try to cook at home, eat more fresh fruits and vegetables, and eat less fast food, canned, processed, or prepared foods. This information is not intended to replace advice given to you by your health care provider. Make sure you discuss any questions you have with your health care provider. Document Revised: 11/07/2017 Document Reviewed: 11/18/2016 Elsevier Patient Education  2020 [employer].

## 2020-03-28 ENCOUNTER — Telehealth: Payer: Self-pay | Admitting: Internal Medicine

## 2020-03-28 NOTE — Telephone Encounter (Signed)
Patient returning call to discuss lab results  ?

## 2020-03-29 NOTE — Telephone Encounter (Signed)
Discussed results with patient

## 2020-03-31 ENCOUNTER — Encounter: Payer: Self-pay | Admitting: Internal Medicine

## 2020-05-05 ENCOUNTER — Emergency Department (HOSPITAL_COMMUNITY): Payer: Medicare Other

## 2020-05-05 ENCOUNTER — Inpatient Hospital Stay (HOSPITAL_COMMUNITY)
Admission: EM | Admit: 2020-05-05 | Discharge: 2020-05-10 | DRG: 280 | Disposition: A | Discharge status: Home / Self Care | Payer: Medicare Other | Attending: Cardiovascular Disease | Admitting: Cardiovascular Disease

## 2020-05-05 ENCOUNTER — Other Ambulatory Visit: Payer: Self-pay

## 2020-05-05 ENCOUNTER — Encounter (HOSPITAL_COMMUNITY): Payer: Self-pay | Admitting: Emergency Medicine

## 2020-05-05 DIAGNOSIS — K029 Dental caries, unspecified: Secondary | ICD-10-CM | POA: Diagnosis not present

## 2020-05-05 DIAGNOSIS — E782 Mixed hyperlipidemia: Secondary | ICD-10-CM | POA: Diagnosis not present

## 2020-05-05 DIAGNOSIS — E1169 Type 2 diabetes mellitus with other specified complication: Secondary | ICD-10-CM | POA: Diagnosis present

## 2020-05-05 DIAGNOSIS — Z885 Allergy status to narcotic agent status: Secondary | ICD-10-CM

## 2020-05-05 DIAGNOSIS — R079 Chest pain, unspecified: Secondary | ICD-10-CM | POA: Diagnosis not present

## 2020-05-05 DIAGNOSIS — G8929 Other chronic pain: Secondary | ICD-10-CM | POA: Diagnosis not present

## 2020-05-05 DIAGNOSIS — R601 Generalized edema: Secondary | ICD-10-CM | POA: Diagnosis not present

## 2020-05-05 DIAGNOSIS — E1142 Type 2 diabetes mellitus with diabetic polyneuropathy: Secondary | ICD-10-CM | POA: Diagnosis not present

## 2020-05-05 DIAGNOSIS — S199XXA Unspecified injury of neck, initial encounter: Secondary | ICD-10-CM | POA: Diagnosis not present

## 2020-05-05 DIAGNOSIS — S0990XA Unspecified injury of head, initial encounter: Secondary | ICD-10-CM | POA: Diagnosis not present

## 2020-05-05 DIAGNOSIS — Z743 Need for continuous supervision: Secondary | ICD-10-CM | POA: Diagnosis not present

## 2020-05-05 DIAGNOSIS — I350 Nonrheumatic aortic (valve) stenosis: Secondary | ICD-10-CM | POA: Diagnosis not present

## 2020-05-05 DIAGNOSIS — Z79899 Other long term (current) drug therapy: Secondary | ICD-10-CM

## 2020-05-05 DIAGNOSIS — Z8249 Family history of ischemic heart disease and other diseases of the circulatory system: Secondary | ICD-10-CM

## 2020-05-05 DIAGNOSIS — I213 ST elevation (STEMI) myocardial infarction of unspecified site: Secondary | ICD-10-CM | POA: Diagnosis not present

## 2020-05-05 DIAGNOSIS — I214 Non-ST elevation (NSTEMI) myocardial infarction: Secondary | ICD-10-CM | POA: Diagnosis not present

## 2020-05-05 DIAGNOSIS — E119 Type 2 diabetes mellitus without complications: Secondary | ICD-10-CM | POA: Diagnosis present

## 2020-05-05 DIAGNOSIS — I5033 Acute on chronic diastolic (congestive) heart failure: Secondary | ICD-10-CM | POA: Diagnosis present

## 2020-05-05 DIAGNOSIS — M25552 Pain in left hip: Secondary | ICD-10-CM | POA: Diagnosis not present

## 2020-05-05 DIAGNOSIS — I444 Left anterior fascicular block: Secondary | ICD-10-CM | POA: Diagnosis present

## 2020-05-05 DIAGNOSIS — K219 Gastro-esophageal reflux disease without esophagitis: Secondary | ICD-10-CM | POA: Diagnosis not present

## 2020-05-05 DIAGNOSIS — J9601 Acute respiratory failure with hypoxia: Secondary | ICD-10-CM | POA: Diagnosis not present

## 2020-05-05 DIAGNOSIS — E869 Volume depletion, unspecified: Secondary | ICD-10-CM | POA: Diagnosis not present

## 2020-05-05 DIAGNOSIS — R0602 Shortness of breath: Secondary | ICD-10-CM | POA: Diagnosis not present

## 2020-05-05 DIAGNOSIS — N179 Acute kidney failure, unspecified: Secondary | ICD-10-CM | POA: Diagnosis not present

## 2020-05-05 DIAGNOSIS — M25462 Effusion, left knee: Secondary | ICD-10-CM | POA: Diagnosis not present

## 2020-05-05 DIAGNOSIS — I110 Hypertensive heart disease with heart failure: Secondary | ICD-10-CM | POA: Diagnosis not present

## 2020-05-05 DIAGNOSIS — Z9049 Acquired absence of other specified parts of digestive tract: Secondary | ICD-10-CM

## 2020-05-05 DIAGNOSIS — I10 Essential (primary) hypertension: Secondary | ICD-10-CM | POA: Diagnosis present

## 2020-05-05 DIAGNOSIS — I471 Supraventricular tachycardia: Secondary | ICD-10-CM | POA: Diagnosis not present

## 2020-05-05 DIAGNOSIS — S79912A Unspecified injury of left hip, initial encounter: Secondary | ICD-10-CM | POA: Diagnosis not present

## 2020-05-05 DIAGNOSIS — Z20822 Contact with and (suspected) exposure to covid-19: Secondary | ICD-10-CM | POA: Diagnosis present

## 2020-05-05 DIAGNOSIS — N390 Urinary tract infection, site not specified: Secondary | ICD-10-CM | POA: Diagnosis not present

## 2020-05-05 DIAGNOSIS — I872 Venous insufficiency (chronic) (peripheral): Secondary | ICD-10-CM | POA: Diagnosis not present

## 2020-05-05 DIAGNOSIS — E1165 Type 2 diabetes mellitus with hyperglycemia: Secondary | ICD-10-CM | POA: Diagnosis present

## 2020-05-05 DIAGNOSIS — S8992XA Unspecified injury of left lower leg, initial encounter: Secondary | ICD-10-CM | POA: Diagnosis not present

## 2020-05-05 DIAGNOSIS — E1149 Type 2 diabetes mellitus with other diabetic neurological complication: Secondary | ICD-10-CM | POA: Diagnosis present

## 2020-05-05 DIAGNOSIS — F329 Major depressive disorder, single episode, unspecified: Secondary | ICD-10-CM | POA: Diagnosis present

## 2020-05-05 DIAGNOSIS — R778 Other specified abnormalities of plasma proteins: Secondary | ICD-10-CM | POA: Diagnosis not present

## 2020-05-05 DIAGNOSIS — E1140 Type 2 diabetes mellitus with diabetic neuropathy, unspecified: Secondary | ICD-10-CM

## 2020-05-05 DIAGNOSIS — R0789 Other chest pain: Secondary | ICD-10-CM | POA: Diagnosis not present

## 2020-05-05 DIAGNOSIS — I700 Atherosclerosis of aorta: Secondary | ICD-10-CM | POA: Diagnosis not present

## 2020-05-05 DIAGNOSIS — Z7982 Long term (current) use of aspirin: Secondary | ICD-10-CM

## 2020-05-05 DIAGNOSIS — Z952 Presence of prosthetic heart valve: Secondary | ICD-10-CM | POA: Diagnosis not present

## 2020-05-05 DIAGNOSIS — R55 Syncope and collapse: Secondary | ICD-10-CM | POA: Diagnosis not present

## 2020-05-05 DIAGNOSIS — I2510 Atherosclerotic heart disease of native coronary artery without angina pectoris: Secondary | ICD-10-CM | POA: Diagnosis not present

## 2020-05-05 DIAGNOSIS — E785 Hyperlipidemia, unspecified: Secondary | ICD-10-CM | POA: Diagnosis present

## 2020-05-05 DIAGNOSIS — IMO0002 Reserved for concepts with insufficient information to code with codable children: Secondary | ICD-10-CM

## 2020-05-05 DIAGNOSIS — E118 Type 2 diabetes mellitus with unspecified complications: Secondary | ICD-10-CM | POA: Diagnosis present

## 2020-05-05 DIAGNOSIS — Z833 Family history of diabetes mellitus: Secondary | ICD-10-CM

## 2020-05-05 DIAGNOSIS — I951 Orthostatic hypotension: Secondary | ICD-10-CM | POA: Diagnosis not present

## 2020-05-05 DIAGNOSIS — Z888 Allergy status to other drugs, medicaments and biological substances status: Secondary | ICD-10-CM | POA: Diagnosis not present

## 2020-05-05 DIAGNOSIS — Z794 Long term (current) use of insulin: Secondary | ICD-10-CM | POA: Diagnosis not present

## 2020-05-05 DIAGNOSIS — K089 Disorder of teeth and supporting structures, unspecified: Secondary | ICD-10-CM | POA: Diagnosis not present

## 2020-05-05 DIAGNOSIS — Z01818 Encounter for other preprocedural examination: Secondary | ICD-10-CM | POA: Diagnosis not present

## 2020-05-05 DIAGNOSIS — R7401 Elevation of levels of liver transaminase levels: Secondary | ICD-10-CM | POA: Diagnosis not present

## 2020-05-05 DIAGNOSIS — I340 Nonrheumatic mitral (valve) insufficiency: Secondary | ICD-10-CM | POA: Diagnosis not present

## 2020-05-05 DIAGNOSIS — R5382 Chronic fatigue, unspecified: Secondary | ICD-10-CM | POA: Diagnosis not present

## 2020-05-05 DIAGNOSIS — E11649 Type 2 diabetes mellitus with hypoglycemia without coma: Secondary | ICD-10-CM | POA: Diagnosis not present

## 2020-05-05 DIAGNOSIS — Z954 Presence of other heart-valve replacement: Secondary | ICD-10-CM | POA: Diagnosis not present

## 2020-05-05 DIAGNOSIS — Z90710 Acquired absence of both cervix and uterus: Secondary | ICD-10-CM

## 2020-05-05 DIAGNOSIS — L8990 Pressure ulcer of unspecified site, unspecified stage: Secondary | ICD-10-CM | POA: Diagnosis not present

## 2020-05-05 DIAGNOSIS — Z006 Encounter for examination for normal comparison and control in clinical research program: Secondary | ICD-10-CM | POA: Diagnosis not present

## 2020-05-05 LAB — BASIC METABOLIC PANEL
Anion gap: 14 (ref 5–15)
BUN: 25 mg/dL — ABNORMAL HIGH (ref 8–23)
CO2: 24 mmol/L (ref 22–32)
Calcium: 8.8 mg/dL — ABNORMAL LOW (ref 8.9–10.3)
Chloride: 96 mmol/L — ABNORMAL LOW (ref 98–111)
Creatinine, Ser: 0.99 mg/dL (ref 0.44–1.00)
GFR calc Af Amer: >60 mL/min (ref >60)
GFR calc non Af Amer: 57 mL/min — ABNORMAL LOW (ref >60)
Glucose, Bld: 223 mg/dL — ABNORMAL HIGH (ref 70–99)
Potassium: 4.7 mmol/L (ref 3.5–5.1)
Sodium: 134 mmol/L — ABNORMAL LOW (ref 135–145)

## 2020-05-05 LAB — CBC
HCT: 43.6 % (ref 36.0–46.0)
Hemoglobin: 14.2 g/dL (ref 12.0–15.0)
MCH: 27.2 pg (ref 26.0–34.0)
MCHC: 32.6 g/dL (ref 30.0–36.0)
MCV: 83.4 fL (ref 80.0–100.0)
Platelets: 283 10*3/uL (ref 150–400)
RBC: 5.23 MIL/uL — ABNORMAL HIGH (ref 3.87–5.11)
RDW: 12.8 % (ref 11.5–15.5)
WBC: 10.2 10*3/uL (ref 4.0–10.5)
nRBC: 0 % (ref 0.0–0.2)

## 2020-05-05 LAB — GLUCOSE, CAPILLARY: Glucose-Capillary: 179 mg/dL — ABNORMAL HIGH (ref 70–99)

## 2020-05-05 LAB — MAGNESIUM: Magnesium: 1.8 mg/dL (ref 1.7–2.4)

## 2020-05-05 LAB — D-DIMER, QUANTITATIVE: D-Dimer, Quant: 0.49 ug/mL-FEU (ref 0.00–0.50)

## 2020-05-05 LAB — TROPONIN I (HIGH SENSITIVITY)
Troponin I (High Sensitivity): 149 ng/L (ref <18)
Troponin I (High Sensitivity): 128 ng/L (ref <18)

## 2020-05-05 LAB — BRAIN NATRIURETIC PEPTIDE: B Natriuretic Peptide: 215.2 pg/mL — ABNORMAL HIGH (ref 0.0–100.0)

## 2020-05-05 LAB — SARS CORONAVIRUS 2 BY RT PCR (HOSPITAL ORDER, PERFORMED IN ~~LOC~~ HOSPITAL LAB): SARS Coronavirus 2: NEGATIVE

## 2020-05-05 MED ORDER — LACTATED RINGERS IV BOLUS
500.0000 mL | Freq: Once | INTRAVENOUS | Status: AC
  Administered 2020-05-05: 500 mL via INTRAVENOUS

## 2020-05-05 MED ORDER — METOPROLOL TARTRATE 12.5 MG HALF TABLET
12.5000 mg | ORAL_TABLET | Freq: Two times a day (BID) | ORAL | Status: DC
  Administered 2020-05-05 – 2020-05-10 (×8): 12.5 mg via ORAL
  Filled 2020-05-05 (×10): qty 1

## 2020-05-05 MED ORDER — GABAPENTIN 300 MG PO CAPS
300.0000 mg | ORAL_CAPSULE | Freq: Three times a day (TID) | ORAL | Status: DC
  Administered 2020-05-05 – 2020-05-10 (×13): 300 mg via ORAL
  Filled 2020-05-05 (×14): qty 1

## 2020-05-05 MED ORDER — LISINOPRIL 20 MG PO TABS
20.0000 mg | ORAL_TABLET | Freq: Every day | ORAL | Status: DC
  Administered 2020-05-06 – 2020-05-10 (×3): 20 mg via ORAL
  Filled 2020-05-05 (×5): qty 1

## 2020-05-05 MED ORDER — NITROGLYCERIN 0.4 MG SL SUBL: 0.4000 mg | SUBLINGUAL_TABLET | SUBLINGUAL | Status: DC | PRN

## 2020-05-05 MED ORDER — HEPARIN BOLUS VIA INFUSION
4000.0000 [IU] | Freq: Once | INTRAVENOUS | Status: AC
  Administered 2020-05-05: 4000 [IU] via INTRAVENOUS
  Filled 2020-05-05: qty 4000

## 2020-05-05 MED ORDER — ASPIRIN EC 81 MG PO TBEC
81.0000 mg | DELAYED_RELEASE_TABLET | Freq: Every day | ORAL | Status: DC
  Administered 2020-05-06 – 2020-05-10 (×5): 81 mg via ORAL
  Filled 2020-05-05 (×5): qty 1

## 2020-05-05 MED ORDER — EZETIMIBE 10 MG PO TABS
10.0000 mg | ORAL_TABLET | Freq: Every day | ORAL | Status: DC
  Administered 2020-05-06 – 2020-05-10 (×4): 10 mg via ORAL
  Filled 2020-05-05 (×5): qty 1

## 2020-05-05 MED ORDER — FUROSEMIDE 10 MG/ML IJ SOLN
40.0000 mg | Freq: Two times a day (BID) | INTRAMUSCULAR | Status: DC
  Administered 2020-05-05 – 2020-05-09 (×8): 40 mg via INTRAVENOUS
  Filled 2020-05-05 (×10): qty 4

## 2020-05-05 MED ORDER — ASPIRIN 81 MG PO CHEW
243.0000 mg | CHEWABLE_TABLET | Freq: Once | ORAL | Status: AC
  Administered 2020-05-05: 243 mg via ORAL
  Filled 2020-05-05: qty 3

## 2020-05-05 MED ORDER — CITALOPRAM HYDROBROMIDE 20 MG PO TABS
20.0000 mg | ORAL_TABLET | Freq: Every day | ORAL | Status: DC
  Administered 2020-05-06 – 2020-05-10 (×4): 20 mg via ORAL
  Filled 2020-05-05 (×5): qty 1

## 2020-05-05 MED ORDER — ATORVASTATIN CALCIUM 40 MG PO TABS
40.0000 mg | ORAL_TABLET | Freq: Every day | ORAL | Status: DC
  Administered 2020-05-06 – 2020-05-10 (×4): 40 mg via ORAL
  Filled 2020-05-05 (×5): qty 1

## 2020-05-05 MED ORDER — SODIUM CHLORIDE 0.9% FLUSH
3.0000 mL | Freq: Once | INTRAVENOUS | Status: AC
  Administered 2020-05-05: 3 mL via INTRAVENOUS

## 2020-05-05 MED ORDER — HEPARIN (PORCINE) 25000 UT/250ML-% IV SOLN
1200.0000 [IU]/h | INTRAVENOUS | Status: DC
  Administered 2020-05-05: 900 [IU]/h via INTRAVENOUS
  Administered 2020-05-06: 1100 [IU]/h via INTRAVENOUS
  Administered 2020-05-07 – 2020-05-09 (×3): 1200 [IU]/h via INTRAVENOUS
  Filled 2020-05-05 (×5): qty 250

## 2020-05-05 MED ORDER — ACETAMINOPHEN 325 MG PO TABS
650.0000 mg | ORAL_TABLET | ORAL | Status: DC | PRN
  Administered 2020-05-07 – 2020-05-08 (×2): 650 mg via ORAL
  Filled 2020-05-05 (×2): qty 2

## 2020-05-05 MED ORDER — PANTOPRAZOLE SODIUM 40 MG PO TBEC
40.0000 mg | DELAYED_RELEASE_TABLET | Freq: Every day | ORAL | Status: DC
  Administered 2020-05-06 – 2020-05-10 (×4): 40 mg via ORAL
  Filled 2020-05-05 (×5): qty 1

## 2020-05-05 MED ORDER — INSULIN ASPART 100 UNIT/ML ~~LOC~~ SOLN
0.0000 [IU] | Freq: Three times a day (TID) | SUBCUTANEOUS | Status: DC
  Administered 2020-05-06: 2 [IU] via SUBCUTANEOUS
  Administered 2020-05-06: 3 [IU] via SUBCUTANEOUS
  Administered 2020-05-06 – 2020-05-07 (×2): 8 [IU] via SUBCUTANEOUS
  Administered 2020-05-07: 5 [IU] via SUBCUTANEOUS

## 2020-05-05 MED ORDER — ONDANSETRON HCL 4 MG/2ML IJ SOLN: 4.0000 mg | Freq: Four times a day (QID) | INTRAMUSCULAR | Status: DC | PRN

## 2020-05-05 NOTE — Progress Notes (Signed)
ANTICOAGULATION CONSULT NOTE - Initial Consult  Pharmacy Consult for heparin Indication: chest pain/ACS  Allergies  Allergen Reactions  . Morphine Other (See Comments)    'took me out of this world' I had to be resuscitated  . Codeine Sulfate Other (See Comments)    GI upset and pain  . Demerol [Meperidine] Nausea And Vomiting  . Meperidine Hcl Nausea And Vomiting and Swelling  . Propoxyphene Hcl Other (See Comments)    Darvocet caused sick headache    Patient Measurements: Height: 5\' 4"  (162.6 cm) Weight: 88.9 kg (196 lb) IBW/kg (Calculated) : 54.7 Heparin Dosing Weight: 74.5kg  Vital Signs: Temp: 98.1 F (36.7 C) (05/28 1426) Temp Source: Oral (05/28 1426) BP: 115/64 (05/28 1900) Pulse Rate: 72 (05/28 1900)  Labs: Recent Labs    05/05/20 1446 05/05/20 1718  HGB 14.2  --   HCT 43.6  --   PLT 283  --   CREATININE 0.99  --   TROPONINIHS 149* 128*    Estimated Creatinine Clearance: 54.6 mL/min (by C-G formula based on SCr of 0.99 mg/dL).   Medical History: Past Medical History:  Diagnosis Date  . Achilles tendinitis   . Acute renal failure (ARF) (HCC) 12/19/2014  . Calcaneal spur    right  . Cataract   . Depression   . Diabetes mellitus    type II  . Diverticulosis of colon (without mention of hemorrhage) 2013  . GERD (gastroesophageal reflux disease)   . GI bleed 03/26/14  . Hyperlipidemia   . Hypertension   . Internal hemorrhoids 2013  . Peripheral neuropathy   . Right shoulder pain    Subacromial tendinitis  . Venous insufficiency   . Ventral hernia     Medications:  Infusions:  . heparin      Assessment: 36 yof presented to the ED with CP. Troponin mildly elevated. To start IV heparin. Baseline CBC is WNL. She is not on anticoagulation PTA.    Goal of Therapy:  Heparin level 0.3-0.7 units/ml Monitor platelets by anticoagulation protocol: Yes   Plan:  Heparin bolus 4000 units IV x 1 Heparin gtt 900 units/hr Check an 8 hr heparin  level Daily heparin level and CBC  Amiley Shishido, 66 05/05/2020,7:51 PM

## 2020-05-05 NOTE — ED Provider Notes (Signed)
Patient placed in Quick Look pathway, seen and evaluated   Chief Complaint: CP/SOB  HPI:   Chest pain began 5 days ago, associated with SOB which is described as difficulty catching a full breath. Symptoms intermittent for four days before becoming constant today. Symptoms worsened with activity. Denies similar in the past. Reports pain radiates up to jaw. Patient took 81mg  aspirin pta. Additionally she reports bilateral lower extremity edema for several months slightly worsened over past week.  ROS: + CP, SOB         - Fv, chills, cough, hemoptysis, abd pain, n/v,   Physical Exam:   Gen: No distress  Neuro: Awake and Alert  Skin: Warm    Focused Exam: Airway clear, no meningeal signs, heart regular rate and rhythm, lungs clear, no increased work of breathing, abdomen soft and nontender, bilateral 2+ lower extremity edema.  CP workup initiated. CBC, BMP, Troponin, DDimer, BNP, CXR and EKG ordered. Remainder of 324mg  aspirin ordered.  Initiation of care has begun. The patient has been counseled on the process, plan, and necessity for staying for the completion/evaluation, and the remainder of the medical screening examination    05/05/20 1500    Elizabeth Palau, MD 05/06/20 [phone]

## 2020-05-05 NOTE — ED Triage Notes (Signed)
Pt reports dizziness, like the room is spinning as well as sob and chest tightness that has been going on for "a while" worse over the past few days. Pt reports symptoms are worse when she up doing work or the dishes. A/ox4, speech clear, face symmetrical, moves all limbs equally.

## 2020-05-05 NOTE — ED Provider Notes (Signed)
[facility] EMERGENCY DEPARTMENT Provider Note   CSN: [id number] Arrival date & time: 05/05/20  1419     History Chief Complaint  Patient presents with  . Shortness of Breath  . Dizziness    [name] is a 74 y.o. female.  HPI Patient is a 74 year old female with history of T2DM, HLD, HTN, venous insufficiency, and chronic BLE edema, who presents for 5 days of worsening symptoms: Exertional dyspnea, chest pressure/tightness, sharp left-sided neck pain, acute on chronic BLE edema, dizziness, worse with standing and/or closing of her eyes, intermittent nausea, and diarrhea.  She has never had a heart attack before.  She has never been formally diagnosed with heart failure.  She suspects that she may have heart failure given her orthopnea, BLE edema, and exertional dyspnea.  She has a strong family history of heart failure.  Family history of ACS includes a father had a massive heart attack at age 74.  Patient has had the aforementioned symptoms over the past 5 days and they have been worsening.  Shortness of breath has increased to being present at rest.  Intermittent symptoms of chest tightness and neck pain have persisted to being near constant.  Her medications include lisinopril, Lipitor, metformin, insulin, and Lasix.  She has not taken her Lasix today.  She denies fevers, chills, vomiting, abdominal pain, or dysuria. HPI: A 74 year old patient with a history of treated diabetes, hypertension, hypercholesterolemia and obesity presents for evaluation of chest pain. Initial onset of pain was less than one hour ago. The patient's chest pain is described as heaviness/pressure/tightness and is worse with exertion. The patient complains of nausea. The patient's chest pain is middle- or left-sided, is not well-localized, is not sharp and does radiate to the arms/jaw/neck. The patient denies diaphoresis. The patient has no history of stroke, has no history of peripheral  artery disease, has not smoked in the past 90 days and has no relevant family history of coronary artery disease (first degree relative at less than age 2).   Past Medical History:  Diagnosis Date  . Achilles tendinitis   . Acute renal failure (ARF) (HCC) 12/19/2014  . Calcaneal spur    right  . Cataract   . Depression   . Diabetes mellitus    type II  . Diverticulosis of colon (without mention of hemorrhage) 2013  . GERD (gastroesophageal reflux disease)   . GI bleed 03/26/14  . Hyperlipidemia   . Hypertension   . Internal hemorrhoids 2013  . Peripheral neuropathy   . Right shoulder pain    Subacromial tendinitis  . Venous insufficiency   . Ventral hernia     Patient Active Problem List   Diagnosis Date Noted  . Non-ST elevation myocardial infarction (NSTEMI) (HCC) 05/05/2020  . NSTEMI (non-ST elevated myocardial infarction) (HCC) 05/05/2020  . Diabetic neuropathy (HCC) 11/13/2018  . Left knee pain 10/24/2017  . Greater trochanteric bursitis of left hip 10/24/2017  . Reactive airway disease 04/09/2014  . Seasonal allergic rhinitis 04/09/2014  . Preventative health care 04/09/2014  . Abnormality of gait 03/17/2013  . Irritable bowel syndrome (IBS) 08/27/2012  . Type II diabetes mellitus with neurological manifestations, 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  . GERD 06/18/2007  . Hyperlipidemia associated with type 2 diabetes mellitus (HCC) 12/27/2006  . Essential hypertension 09/19/2006  . Venous (peripheral) insufficiency 09/19/2006    Past Surgical History:  Procedure Laterality Date  . ABDOMINAL HYSTERECTOMY  2001   TAH-BSO  . CHOLECYSTECTOMY  2001  .  COLONOSCOPY  2013   diverticulosis   . ESOPHAGOGASTRODUODENOSCOPY  2013   normal   . INCISIONAL HERNIA REPAIR    . TONSILLECTOMY       OB History   No obstetric history on file.     Family History  Problem Relation Age of Onset  . Heart failure Father        Died in 3180s  . CVA Father   . Ovarian cancer Sister    . Diabetes Sister   . Other Sister        died at birth  . Heart failure Mother        Died in 4080s  . Other Brother        drowned  . Diabetes Paternal Grandmother   . Breast cancer Paternal Aunt   . Colon cancer Neg Hx     Social History   Tobacco Use  . Smoking status: Never Smoker  . Smokeless tobacco: Never Used  Substance Use Topics  . Alcohol use: No  . Drug use: No    Home Medications Prior to Admission medications   Medication Sig Start Date End Date Taking? Authorizing Provider  aspirin 81 MG tablet Take 81 mg by mouth daily.   Yes [provider]  atorvastatin (LIPITOR) 40 MG tablet Take 1 tablet (40 mg total) by mouth daily. 03/27/20 06/25/20 Yes Marjie SkiffGoodrich, Callie E, PA-C  citalopram (CELEXA) 20 MG tablet Take 1 tablet (20 mg total) by mouth daily. 03/07/20  Yes Myrlene Brokerrawford, [name], MD  Continuous Blood Gluc Sensor (FREESTYLE LIBRE SENSOR SYSTEM) MISC Use to monitor sugars 03/20/20  Yes Myrlene Brokerrawford, [name], MD  esomeprazole (NEXIUM) 20 MG capsule Take 1 capsule (20 mg total) by mouth daily at 12 noon. 03/07/20  Yes Myrlene Brokerrawford, [name], MD  ezetimibe (ZETIA) 10 MG tablet Take 1 tablet (10 mg total) by mouth daily. 03/20/20  Yes Myrlene Brokerrawford, [name], MD  furosemide (LASIX) 40 MG tablet Take 1 tablet (40 mg total) by mouth daily. 03/07/20  Yes Myrlene Brokerrawford, [name], MD  gabapentin (NEURONTIN) 300 MG capsule Take 1 capsule (300 mg total) by mouth 3 (three) times daily. 03/07/20  Yes Myrlene Brokerrawford, [name], MD  glucose blood [facility](ONETOUCH VERIO) test strip Use as instructed 03/07/20  Yes Myrlene Brokerrawford, [name], MD  Insulin Lispro Prot & Lispro (HUMALOG MIX 75/25 KWIKPEN) (75-25) 100 UNIT/ML Kwikpen Inject 44 Units into the skin 2 (two) times daily. 08/30/19  Yes Shamleffer, Konrad DoloresIbtehal Jaralla, MD  Insulin Pen Needle 31G X 8 MM MISC Use to inject insulin four times daily E11.41 04/06/15  Yes Myrlene Brokerrawford, [name], MD  lisinopril (ZESTRIL) 20 MG tablet Take 1 tablet (20 mg  total) by mouth daily. 03/07/20  Yes Myrlene Brokerrawford, [name], MD  metFORMIN (GLUCOPHAGE) 1000 MG tablet Take 0.5 tablets (500 mg total) by mouth 2 (two) times daily with a meal. 03/07/20  Yes Myrlene Brokerrawford, [name], MD  sodium chloride (OCEAN) 0.65 % SOLN nasal spray Place 1 spray into both nostrils 4 (four) times daily. Patient taking differently: Place 1 spray into both nostrils as needed for congestion.  03/07/20  Yes Myrlene Brokerrawford, [name], MD  Trolamine Salicylate (ASPERCREME EX) Apply 1 application topically daily as needed (shoulder/hips/knee/knuckles).   Yes [provider]    Allergies    Morphine, Codeine sulfate, Demerol [meperidine], Meperidine hcl, and Propoxyphene hcl  Review of Systems   Review of Systems  Constitutional: Positive for activity change, appetite change and fatigue. Negative for chills and fever.  HENT: Negative for ear pain and sore throat.   Eyes: Negative for pain and visual disturbance.  Respiratory: Positive for shortness of breath. Negative for cough.   Cardiovascular: Positive for chest pain and leg swelling. Negative for palpitations.  Gastrointestinal: Positive for diarrhea and nausea. Negative for abdominal pain and vomiting.  Genitourinary: Negative for dysuria and hematuria.  Musculoskeletal: Positive for gait problem and neck pain. Negative for arthralgias and back pain.  Skin: Negative for color change and rash.  Neurological: Positive for dizziness and headaches. Negative for seizures, syncope, weakness and numbness.  Hematological: Negative.  Does not bruise/bleed easily.  Psychiatric/Behavioral: Negative.  Negative for confusion and decreased concentration.  All other systems reviewed and are negative.   Physical Exam Updated Vital Signs BP (!) 115/57 (BP Location: Left Arm)   Pulse 75   Temp 97.6 F (36.4 C) (Oral)   Resp 16   Ht 5\' 4"  (1.626 m)   Wt 85.4 kg   SpO2 95%   BMI 32.32 kg/m   Physical Exam Vitals and nursing note  reviewed.  Constitutional:      General: She is not in acute distress.    Appearance: She is well-developed. She is obese. She is not ill-appearing, toxic-appearing or diaphoretic.  HENT:     Head: Normocephalic and atraumatic.     Mouth/Throat:     Mouth: Mucous membranes are moist.     Pharynx: Oropharynx is clear.  Eyes:     Extraocular Movements: Extraocular movements intact.     Conjunctiva/sclera: Conjunctivae normal.  Cardiovascular:     Rate and Rhythm: Normal rate and regular rhythm.     Heart sounds: Murmur present.  Pulmonary:     Effort: Pulmonary effort is normal. No tachypnea, accessory muscle usage or respiratory distress.     Breath sounds: Normal breath sounds. No decreased breath sounds, wheezing, rhonchi or rales.  Chest:     Chest wall: No tenderness.  Abdominal:     Palpations: Abdomen is soft.     Tenderness: There is no abdominal tenderness. There is no guarding.  Musculoskeletal:     Cervical back: Normal range of motion and neck supple.     Right lower leg: Tenderness present. Edema present.     Left lower leg: Tenderness present. Edema present.  Skin:    General: Skin is warm and dry.  Neurological:     General: No focal deficit present.     Mental Status: She is alert and oriented to person, place, and time.  Psychiatric:        Mood and Affect: Mood normal.        Behavior: Behavior normal.     ED Results / Procedures / Treatments   Labs (all labs ordered are listed, but only abnormal results are displayed) Labs Reviewed  BASIC METABOLIC PANEL - Abnormal; Notable for the following components:      Result Value   Sodium 134 (*)    Chloride 96 (*)    Glucose, Bld 223 (*)    BUN 25 (*)    Calcium 8.8 (*)    GFR calc non Af Amer 57 (*)    All other components within normal limits  CBC - Abnormal; Notable for the following components:   RBC 5.23 (*)    All other components within normal limits  BRAIN NATRIURETIC PEPTIDE - Abnormal; Notable  for the following components:   B Natriuretic Peptide 215.2 (*)    All other components within normal limits  GLUCOSE, CAPILLARY - Abnormal; Notable for the following components:   Glucose-Capillary 179 (*)    All other components within normal limits  TROPONIN I (HIGH SENSITIVITY) - Abnormal; Notable for the following components:   Troponin I (High Sensitivity) 149 (*)    All other components within normal limits  TROPONIN I (HIGH SENSITIVITY) - Abnormal; Notable for the following components:   Troponin I (High Sensitivity) 128 (*)    All other components within normal limits  SARS CORONAVIRUS 2 BY RT PCR (HOSPITAL ORDER, [facility])  MRSA PCR SCREENING  D-DIMER, QUANTITATIVE (NOT AT [facility])  MAGNESIUM  HEPARIN LEVEL (UNFRACTIONATED)  CBC  BASIC METABOLIC PANEL    EKG EKG Interpretation  Date/Time:  Friday May 05 2020 14:21:04 EDT Ventricular Rate:  88 PR Interval:  116 QRS Duration: 90 QT Interval:  352 QTC Calculation: 425 R Axis:   -37 Text Interpretation: Normal sinus rhythm Left axis deviation Moderate voltage criteria for LVH, may be normal variant ( R in aVL , Cornell product ) Abnormal ECG Confirmed by Gerlene Fee [phone]) on 05/05/2020 5:16:35 PM   Radiology DG Chest 2 View  Result Date: 05/05/2020 CLINICAL DATA:  Chest tightness, short of breath EXAM: CHEST - 2 VIEW COMPARISON:  12/19/2014 FINDINGS: The heart size and mediastinal contours are within normal limits. Both lungs are clear. The visualized skeletal structures are unremarkable. IMPRESSION: No active cardiopulmonary disease. Electronically Signed   By: Donavan Foil M.D.   On: 05/05/2020 15:26    Procedures Procedures (including critical care time)  Medications Ordered in ED Medications  aspirin EC tablet 81 mg (has no administration in time range)  atorvastatin (LIPITOR) tablet 40 mg (has no administration in time range)  ezetimibe (ZETIA) tablet 10 mg (has no administration  in time range)  lisinopril (ZESTRIL) tablet 20 mg (has no administration in time range)  citalopram (CELEXA) tablet 20 mg (has no administration in time range)  gabapentin (NEURONTIN) capsule 300 mg (300 mg Oral Given 05/05/20 2246)  pantoprazole (PROTONIX) EC tablet 40 mg (has no administration in time range)  nitroGLYCERIN (NITROSTAT) SL tablet 0.4 mg (has no administration in time range)  acetaminophen (TYLENOL) tablet 650 mg (has no administration in time range)  ondansetron (ZOFRAN) injection 4 mg (has no administration in time range)  metoprolol tartrate (LOPRESSOR) tablet 12.5 mg (12.5 mg Oral Given 05/05/20 2247)  furosemide (LASIX) injection 40 mg (40 mg Intravenous Given 05/05/20 2032)  insulin aspart (novoLOG) injection 0-15 Units (has no administration in time range)  heparin ADULT infusion 100 units/mL (25000 units/243mL sodium chloride 0.45%) (900 Units/hr Intravenous New Bag/Given 05/05/20 2034)  sodium chloride flush (NS) 0.9 % injection 3 mL (3 mLs Intravenous Given 05/05/20 1718)  aspirin chewable tablet 243 mg (243 mg Oral Given 05/05/20 1453)  lactated ringers bolus 500 mL (0 mLs Intravenous Stopped 05/05/20 1853)  heparin bolus via infusion 4,000 Units (4,000 Units Intravenous Bolus from Bag 05/05/20 2034)    ED Course  I have reviewed the triage vital signs and the nursing notes.  Pertinent labs & imaging results that were available during my care of the patient were reviewed by me and considered in my medical decision making (see chart for details).    MDM Rules/Calculators/A&P HEAR Score: 7                    Patient is a 74 year old female who presents for worsening symptoms of chest tightness, left-sided neck pain, shortness of  breath, BLE edema, and vertigo.  In triage, 324 ASA was given.  Initial troponin was elevated at 149.  BNP was slightly elevated at 215.  EKG shows no ST segment changes.  Upon arrival in ED, she continues to endorse chest tightness, left-sided  neck pain.  On exam, lungs are clear to auscultation.  She does have equal, bilateral BLE edema.  Abdomen soft and nontender.  She has no chest tenderness.  She is alert and oriented.  Vital signs are notable for hypotension (initially 94/55).  She is afebrile.  SPO2 is normal on room air.  500 cc bolus of IVF was given.  Upon reassessment, patient's blood pressure had normalized.  Second troponin had down trended to 128.  Cardiology was consulted.  Patient was admitted to cardiology service for further management/diagnostic studies.  Final Clinical Impression(s) / ED Diagnoses Final diagnoses:  NSTEMI (non-ST elevated myocardial infarction) [facility])    Rx / DC Orders ED Discharge Orders    None       Gloris Manchester, MD 05/06/20 0141    Sabas Sous, MD 05/11/20 [phone]

## 2020-05-05 NOTE — ED Notes (Signed)
Report attempted, 2C unable to take at this time 

## 2020-05-05 NOTE — ED Notes (Signed)
PA Morelli in to assess patient.

## 2020-05-05 NOTE — H&P (Addendum)
Cardiology Admission History and Physical:   Patient ID: [name] MRN: [id number]; DOB: March 30, 1946   Admission date: 05/05/2020  Primary Care Provider: Myrlene Broker, MD Primary Cardiologist: Chilton Si, MD  Primary Electrophysiologist:  None   Chief Complaint: Chest pain  Patient Profile:   [name] is a 74 y.o. female with hypertension, hyperlipidemia, back pain (possible anginal equivalent) with chronic lower extremity edema here with chest pain starting a few days ago.  History of Present Illness:   [name] is a 74 year old female with diabetes with hypertension hyperlipidemia back pain lower extremity edema who comes into the ER today with chest discomfort that started a few days ago waxing and waning.   This chest discomfort began about 5 days ago felt more shortness of breath at the time had some difficulty catching her breath.  Today it was constant and seem to be worse with activity.  Sometimes the pain radiates up to her jaw.  She took an aspirin 81 mg.  Felt as though an elephant was sitting on her chest earlier today.  Concerned her.  Moderate to severe in severity.  Relieved with rest at one point.  Did feel some mild shortness of breath.  Currently she is feeling better, her sister-in-law is with her.  Back in 2016 she was evaluated by Dr. Duke Salvia for chest discomfort after having some exertional chest discomfort.  She reported that it was mostly back pain that radiated up to her head and that was very atypical.  Eugenie Birks was performed and was low risk without any evidence of ischemia in 2016.  She was lost to follow-up until recently in 03/27/2020 when she was seen by Irene Limbo, [place].  At that time in April she continued to have back and neck pain but thought this was because she reinjured her back back in January.  She was not having any shortness of breath at that time.  Her lower extremity edema however was getting worse, some  dietary indiscretions were noted.  She only has 7 teeth so is hard for her to eat frozen foods.  She did not have any recent orthopnea.  Over the past year she had about 8 deaths in the family and noted some skipping of heartbeats during the funeral and stressful events.  Some dizziness with quick positional changes but no syncope.  Her diabetes has been difficult to control with hemoglobin A1c of 11.2.  She also has neuropathy. Past Medical History:  Diagnosis Date  . Achilles tendinitis   . Acute renal failure (ARF) (HCC) 12/19/2014  . Calcaneal spur    right  . Cataract   . Depression   . Diabetes mellitus    type II  . Diverticulosis of colon (without mention of hemorrhage) 2013  . GERD (gastroesophageal reflux disease)   . GI bleed 03/26/14  . Hyperlipidemia   . Hypertension   . Internal hemorrhoids 2013  . Peripheral neuropathy   . Right shoulder pain    Subacromial tendinitis  . Venous insufficiency   . Ventral hernia     Past Surgical History:  Procedure Laterality Date  . ABDOMINAL HYSTERECTOMY  2001   TAH-BSO  . CHOLECYSTECTOMY  2001  . COLONOSCOPY  2013   diverticulosis   . ESOPHAGOGASTRODUODENOSCOPY  2013   normal   . INCISIONAL HERNIA REPAIR    . TONSILLECTOMY       Medications Prior to Admission: Prior to Admission medications   Medication Sig Start Date End Date Taking?  Authorizing Provider  aspirin 81 MG tablet Take 81 mg by mouth daily.    [provider]  atorvastatin (LIPITOR) 40 MG tablet Take 1 tablet (40 mg total) by mouth daily. 03/27/20 06/25/20  Marjie Skiff E, PA-C  citalopram (CELEXA) 20 MG tablet Take 1 tablet (20 mg total) by mouth daily. 03/07/20   Myrlene Broker, MD  Continuous Blood Gluc Sensor (FREESTYLE LIBRE SENSOR SYSTEM) MISC Use to monitor sugars 03/20/20   Myrlene Broker, MD  esomeprazole (NEXIUM) 20 MG capsule Take 1 capsule (20 mg total) by mouth daily at 12 noon. 03/07/20   Myrlene Broker, MD   ezetimibe (ZETIA) 10 MG tablet Take 1 tablet (10 mg total) by mouth daily. 03/20/20   Myrlene Broker, MD  furosemide (LASIX) 40 MG tablet Take 1 tablet (40 mg total) by mouth daily. 03/07/20   Myrlene Broker, MD  gabapentin (NEURONTIN) 300 MG capsule Take 1 capsule (300 mg total) by mouth 3 (three) times daily. 03/07/20   Myrlene Broker, MD  glucose blood [facility] VERIO) test strip Use as instructed 03/07/20   Myrlene Broker, MD  Insulin Lispro Prot & Lispro (HUMALOG MIX 75/25 KWIKPEN) (75-25) 100 UNIT/ML Kwikpen Inject 44 Units into the skin 2 (two) times daily. 08/30/19   Shamleffer, Konrad Dolores, MD  Insulin Pen Needle 31G X 8 MM MISC Use to inject insulin four times daily E11.41 04/06/15   Myrlene Broker, MD  lisinopril (ZESTRIL) 20 MG tablet Take 1 tablet (20 mg total) by mouth daily. 03/07/20   Myrlene Broker, MD  metFORMIN (GLUCOPHAGE) 1000 MG tablet Take 0.5 tablets (500 mg total) by mouth 2 (two) times daily with a meal. 03/07/20   Myrlene Broker, MD  sodium chloride (OCEAN) 0.65 % SOLN nasal spray Place 1 spray into both nostrils 4 (four) times daily. 03/07/20   Myrlene Broker, MD  Trolamine Salicylate (ASPERCREME EX) Apply 1 application topically daily as needed (shoulder/hips/knee/knuckles).    [provider]     Allergies:    Allergies  Allergen Reactions  . Morphine Other (See Comments)    'took me out of this world' I had to be resuscitated  . Codeine Sulfate Other (See Comments)    GI upset and pain  . Demerol [Meperidine] Nausea And Vomiting  . Meperidine Hcl Nausea And Vomiting and Swelling  . Propoxyphene Hcl Other (See Comments)    Darvocet caused sick headache    Social History:   Social History   Socioeconomic History  . Marital status: Divorced    Spouse name: Not on file  . Number of children: 2  . Years of education: Not on file  . Highest education level: Not on file  Occupational History  .  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 Smoking status: Never Smoker  . Smokeless tobacco: Never Used  Substance and Sexual Activity  . Alcohol use: No  . Drug use: No  . Sexual activity: Not on file  Other Topics Concern  . Not on file  Social History Narrative   Divorced   Never Smoked   Alcohol use-no   Drug use-no      Recently had to retire from job 2/2 limitaitons of walking      Quilting is a good Public librarian initiated. Patient needs to submit further paperwork to complete  Rudell CobbDeborah Hill  March 06, 2010 2:35 PM   Financial assistance approved for [facility]% discount [facility] and has [facility] card   Rudell Cobbeborah Hill  March 12, 2010 3:55 PM   Social Determinants of Health   Financial Resource Strain:   . Difficulty of Paying Living Expenses:   Food Insecurity:   . Worried About Programme researcher, broadcasting/film/videounning Out of Food in the Last Year:   . Baristaan Out of Food in the Last Year:   Transportation Needs:   . Freight forwarderLack of Transportation (Medical):   Marland Kitchen. Lack of Transportation (Non-Medical):   Physical Activity:   . Days of Exercise per Week:   . Minutes of Exercise per Session:   Stress:   . Feeling of Stress :   Social Connections:   . Frequency of Communication with Friends and Family:   . Frequency of Social Gatherings with Friends and Family:   . Attends Religious Services:   . Active Member of Clubs or Organizations:   . Attends BankerClub or Organization Meetings:   Marland Kitchen. Marital Status:   Intimate Partner Violence:   . Fear of Current or Ex-Partner:   . Emotionally Abused:   Marland Kitchen. Physically Abused:   . Sexually Abused:     Family History:   The patient's family history includes Breast cancer in her paternal aunt; CVA in her father; Diabetes in her paternal grandmother and sister; Heart failure in her father and mother; Other in her brother and sister; Ovarian cancer in her sister. There is no history of Colon cancer.     ROS:  Please see the history of present illness.  All other ROS reviewed and negative.     Physical Exam/Data:   Vitals:   05/05/20 1800 05/05/20 1815 05/05/20 1830 05/05/20 1845  BP: 128/70 106/75 124/78 126/64  Pulse: 76 74 75 72  Resp: 15 19 19 15   Temp:      TempSrc:      SpO2: 97% 95% 95% 97%  Weight:      Height:        Intake/Output Summary (Last 24 hours) at 05/05/2020 1856 Last data filed at 05/05/2020 1853 Gross per 24 hour  Intake 500 ml  Output -  Net 500 ml   Last 3 Weights 05/05/2020 03/27/2020 03/20/2020  Weight (lbs) 196 lb 196 lb 193 lb  Weight (kg) 88.905 kg 88.905 kg 87.544 kg     Body mass index is 33.64 kg/m.  General:  Well nourished, well developed, in no acute distress HEENT: normal Lymph: no adenopathy Neck: no JVD Endocrine:  No thryomegaly Vascular: No carotid bruits; FA pulses 2+ bilaterally without bruits  Cardiac:  normal S1, S2; RRR; 2/6 systolic murmur Lungs:  clear to auscultation bilaterally, no wheezing, rhonchi or rales  Abd: soft, nontender, no hepatomegaly  Ext: 3-4+ pedal, puffy edema bilaterally with 3+ bilateral lower extremity edema Musculoskeletal:  No deformities, BUE and BLE strength normal and equal Skin: warm and dry  Neuro:  CNs 2-12 intact, no focal abnormalities noted Psych:  Normal affect    EKG:  The ECG that was done  was personally reviewed and demonstrates sinus rhythm left anterior fascicular block, nonspecific ST-T wave changes  Relevant CV Studies: Nuclear stress test 2016 low risk with no ischemia  Laboratory Data:  High Sensitivity Troponin:   Recent Labs  Lab 05/05/20 1446 05/05/20 1718  TROPONINIHS 149* 128*      Chemistry Recent Labs  Lab 05/05/20 1446  NA 134*  K 4.7  CL 96*  CO2 24  GLUCOSE 223*  BUN 25*  CREATININE 0.99  CALCIUM 8.8*  GFRNONAA 57*  GFRAA >60  ANIONGAP 14    No results for input(s): PROT, ALBUMIN, AST, ALT, ALKPHOS, BILITOT in the last 168 hours. Hematology  Recent Labs  Lab 05/05/20 1446  WBC 10.2  RBC 5.23*  HGB 14.2  HCT 43.6  MCV 83.4  MCH 27.2  MCHC 32.6  RDW 12.8  PLT 283   BNP Recent Labs  Lab 05/05/20 1449  BNP 215.2*    DDimer  Recent Labs  Lab 05/05/20 1458  DDIMER 0.49     Radiology/Studies:  DG Chest 2 View  Result Date: 05/05/2020 CLINICAL DATA:  Chest tightness, short of breath EXAM: CHEST - 2 VIEW COMPARISON:  12/19/2014 FINDINGS: The heart size and mediastinal contours are within normal limits. Both lungs are clear. The visualized skeletal structures are unremarkable. IMPRESSION: No active cardiopulmonary disease. Electronically Signed   By: Donavan Foil M.D.   On: 05/05/2020 15:26       TIMI Risk Score for Unstable Angina or Non-ST Elevation MI:   The patient's TIMI risk score is 5, which indicates a 26% risk of all cause mortality, new or recurrent myocardial infarction or need for urgent revascularization in the next 14 days.   Assessment and Plan:   NSTEMI  - Trop 149 now 128 elevated and down trending, explained to her that given her constellation of symptoms including elephant sitting on her chest, advanced age, uncontrolled diabetes that she could have had a mild heart attack earlier.  Deserves angiography.  - May have had MI beginning a few days ago when symptoms started  - Will go ahead and place on heparin IV, ASA, Bb, statin  - Plan on cath Monday or sooner if symptoms worsen.  -Risks and benefits of been explained including stroke heart attack death renal impairment bleeding and she is willing to proceed.  -We will go ahead and gently diurese her with IV Lasix given her marked lower extremity and pedal edema.  Watch creatinine.  -Check echocardiogram given murmur as well as worsening edema and recent chest pain.  -Covid negative  Diabetes with neuropathy, hypertension, hyperlipidemia -Continue with insulin sliding scale, long-acting insulin, diabetes educator. -On lisinopril 20 mg a day,  continue. -LDL recently 124, recently started atorvastatin by Sande Rives, PA, 40 mg and continued with Zetia. Chronic venous insufficiency -Has been experiencing chronic/worsening lower extremity edema.  Lasix 40 mg a day was taken at home.  High sodium diet.    Severity of Illness: The appropriate patient status for this patient is INPATIENT. Inpatient status is judged to be reasonable and necessary in order to provide the required intensity of service to ensure the patient's safety. The patient's presenting symptoms, physical exam findings, and initial radiographic and laboratory data in the context of their chronic comorbidities is felt to place them at high risk for further clinical deterioration. Furthermore, it is not anticipated that the patient will be medically stable for discharge from the hospital within 2 midnights of admission. The following factors support the patient status of inpatient.   " The patient's presenting symptoms include chest pain/angina. " The worrisome physical exam findings include pedal edema. " The initial radiographic and laboratory data are worrisome because of elevated troponin. " The chronic co-morbidities include diabetes hypertension hyperlipidemia.   * I certify that at the point of admission it is my clinical judgment that the patient will require inpatient hospital  care spanning beyond 2 midnights from the point of admission due to high intensity of service, high risk for further deterioration and high frequency of surveillance required.*    For questions or updates, please contact [facility] Please consult [email or web address] for contact info under        Signed, Donato Schultz, MD  05/05/2020 6:56 PM

## 2020-05-06 ENCOUNTER — Inpatient Hospital Stay (HOSPITAL_COMMUNITY): Payer: Medicare Other

## 2020-05-06 ENCOUNTER — Other Ambulatory Visit: Payer: Self-pay

## 2020-05-06 DIAGNOSIS — I350 Nonrheumatic aortic (valve) stenosis: Secondary | ICD-10-CM

## 2020-05-06 DIAGNOSIS — I340 Nonrheumatic mitral (valve) insufficiency: Secondary | ICD-10-CM

## 2020-05-06 LAB — BASIC METABOLIC PANEL
Anion gap: 9 (ref 5–15)
BUN: 16 mg/dL (ref 8–23)
CO2: 29 mmol/L (ref 22–32)
Calcium: 8.6 mg/dL — ABNORMAL LOW (ref 8.9–10.3)
Chloride: 98 mmol/L (ref 98–111)
Creatinine, Ser: 0.77 mg/dL (ref 0.44–1.00)
GFR calc Af Amer: >60 mL/min (ref >60)
GFR calc non Af Amer: >60 mL/min (ref >60)
Glucose, Bld: 258 mg/dL — ABNORMAL HIGH (ref 70–99)
Potassium: 4.3 mmol/L (ref 3.5–5.1)
Sodium: 136 mmol/L (ref 135–145)

## 2020-05-06 LAB — CBC
HCT: 43.6 % (ref 36.0–46.0)
Hemoglobin: 14.1 g/dL (ref 12.0–15.0)
MCH: 27.4 pg (ref 26.0–34.0)
MCHC: 32.3 g/dL (ref 30.0–36.0)
MCV: 84.8 fL (ref 80.0–100.0)
Platelets: 233 10*3/uL (ref 150–400)
RBC: 5.14 MIL/uL — ABNORMAL HIGH (ref 3.87–5.11)
RDW: 12.8 % (ref 11.5–15.5)
WBC: 8.2 10*3/uL (ref 4.0–10.5)
nRBC: 0 % (ref 0.0–0.2)

## 2020-05-06 LAB — ECHOCARDIOGRAM COMPLETE
Height: 64 in
Weight: 3012.37 oz

## 2020-05-06 LAB — GLUCOSE, CAPILLARY
Glucose-Capillary: 286 mg/dL — ABNORMAL HIGH (ref 70–99)
Glucose-Capillary: 272 mg/dL — ABNORMAL HIGH (ref 70–99)
Glucose-Capillary: 144 mg/dL — ABNORMAL HIGH (ref 70–99)
Glucose-Capillary: 167 mg/dL — ABNORMAL HIGH (ref 70–99)
Glucose-Capillary: 320 mg/dL — ABNORMAL HIGH (ref 70–99)

## 2020-05-06 LAB — MRSA PCR SCREENING: MRSA by PCR: NEGATIVE

## 2020-05-06 LAB — HEPARIN LEVEL (UNFRACTIONATED): Heparin Unfractionated: 0.24 IU/mL — ABNORMAL LOW (ref 0.30–0.70)

## 2020-05-06 MED ORDER — PERFLUTREN LIPID MICROSPHERE
1.0000 mL | INTRAVENOUS | Status: AC | PRN
  Administered 2020-05-06: 2 mL via INTRAVENOUS
  Filled 2020-05-06: qty 10

## 2020-05-06 MED ORDER — LIVING WELL WITH DIABETES BOOK
Freq: Once | Status: AC
  Filled 2020-05-06: qty 1

## 2020-05-06 NOTE — Progress Notes (Signed)
CARDIAC REHAB PHASE I  MI book given and discussed what happens when you have a heart attack with patient. Discussed phase 2 cardiac rehab and permission given to send referral to cardiac rehab program at [facility], will place referral. Will follow-up with patient after cath.  Artist Pais, MS, ACSM [employer] 05/06/2020 [phone]

## 2020-05-06 NOTE — Progress Notes (Signed)
ANTICOAGULATION CONSULT NOTE  Pharmacy Consult for heparin Indication: chest pain/ACS  Allergies  Allergen Reactions  . Morphine Other (See Comments)    'took me out of this world' I had to be resuscitated  . Codeine Sulfate Other (See Comments)    GI upset and pain  . Demerol [Meperidine] Nausea And Vomiting  . Meperidine Hcl Nausea And Vomiting and Swelling  . Propoxyphene Hcl Other (See Comments)    Darvocet caused sick headache    Patient Measurements: Height: 5\' 4"  (162.6 cm) Weight: 85.4 kg (188 lb 4.4 oz) IBW/kg (Calculated) : 54.7 Heparin Dosing Weight: 74.5kg  Vital Signs: Temp: 98 F (36.7 C) (05/29 0759) Temp Source: Oral (05/29 0759) BP: 109/83 (05/29 0759) Pulse Rate: 82 (05/29 0759)  Labs: Recent Labs    05/05/20 1446 05/05/20 1718 05/06/20 0745 05/06/20 0750  HGB 14.2  --   --  14.1  HCT 43.6  --   --  43.6  PLT 283  --   --  233  HEPARINUNFRC  --   --  0.24*  --   CREATININE 0.99  --   --   --   TROPONINIHS 149* 128*  --   --     Estimated Creatinine Clearance: 53.5 mL/min (by C-G formula based on SCr of 0.99 mg/dL).   Medical History: Past Medical History:  Diagnosis Date  . Achilles tendinitis   . Acute renal failure (ARF) (HCC) 12/19/2014  . Calcaneal spur    right  . Cataract   . Depression   . Diabetes mellitus    type II  . Diverticulosis of colon (without mention of hemorrhage) 2013  . GERD (gastroesophageal reflux disease)   . GI bleed 03/26/14  . Hyperlipidemia   . Hypertension   . Internal hemorrhoids 2013  . Peripheral neuropathy   . Right shoulder pain    Subacromial tendinitis  . Venous insufficiency   . Ventral hernia     Medications:  Infusions:  . heparin 900 Units/hr (05/05/20 2034)    Assessment: 72 yof presented to the ED with CP. Troponin mildly elevated. To start IV heparin. Baseline CBC is WNL. She is not on anticoagulation PTA.   Initial heparin level subtherapeutic at 0.24, CBC stable. Cath planned  for Monday.   Goal of Therapy:  Heparin level 0.3-0.7 units/ml Monitor platelets by anticoagulation protocol: Yes   Plan:  -Increase heparin to 1100 units/h -Recheck heparin level in am   Sunday, PharmD, BCPS Clinical Pharmacist [phone] Please check AMION for all [facility] Pharmacy numbers 05/06/2020

## 2020-05-06 NOTE — Progress Notes (Signed)
Progress Note  Patient Name: [name] Date of Encounter: 05/06/2020  Primary Cardiologist: Chilton Si, MD  Subjective   No chest pain or breathlessness at rest.  No palpitations, no abdominal pain.  Inpatient Medications    Scheduled Meds: . aspirin EC  81 mg Oral Daily  . atorvastatin  40 mg Oral Daily  . citalopram  20 mg Oral Daily  . ezetimibe  10 mg Oral Daily  . furosemide  40 mg Intravenous BID  . gabapentin  300 mg Oral TID  . insulin aspart  0-15 Units Subcutaneous TID WC  . lisinopril  20 mg Oral Daily  . metoprolol tartrate  12.5 mg Oral BID  . pantoprazole  40 mg Oral Daily   Continuous Infusions: . heparin 1,100 Units/hr (05/06/20 0851)   PRN Meds: acetaminophen, nitroGLYCERIN, ondansetron (ZOFRAN) IV   Vital Signs    Vitals:   05/06/20 0400 05/06/20 0759 05/06/20 0800 05/06/20 1107  BP:  109/83 110/68 (!) 112/59  Pulse:  82 82   Resp: 18 18 20 15   Temp: 97.6 F (36.4 C) 98 F (36.7 C) 98 F (36.7 C) 98.2 F (36.8 C)  TempSrc: Oral Oral Oral Oral  SpO2:  94% 94%   Weight:      Height:        Intake/Output Summary (Last 24 hours) at 05/06/2020 1126 Last data filed at 05/06/2020 0800 Gross per 24 hour  Intake 740 ml  Output 2150 ml  Net -1410 ml   Filed Weights   05/05/20 1426 05/05/20 2056  Weight: 88.9 kg 85.4 kg    Telemetry    Sinus rhythm.   Personally reviewed.  ECG    An ECG dated 05/05/2020 was personally reviewed today and demonstrated:  Sinus rhythm with LVH, left anterior fascicular block, poor R wave progression.  Physical Exam   GEN: No acute distress.   Neck: No JVD. Cardiac: RRR, 2/6 systolic murmur, no gallop.  Respiratory: Nonlabored. Clear to auscultation bilaterally. GI: Soft, nontender, bowel sounds present. MS: No edema; No deformity. Neuro:  Nonfocal. Psych: Alert and oriented x 3. Normal affect.  Labs    Chemistry Recent Labs  Lab 05/05/20 1446 05/06/20 0750  NA 134* 136  K 4.7  4.3  CL 96* 98  CO2 24 29  GLUCOSE 223* 258*  BUN 25* 16  CREATININE 0.99 0.77  CALCIUM 8.8* 8.6*  GFRNONAA 57* >60  GFRAA >60 >60  ANIONGAP 14 9     Hematology Recent Labs  Lab 05/05/20 1446 05/06/20 0750  WBC 10.2 8.2  RBC 5.23* 5.14*  HGB 14.2 14.1  HCT 43.6 43.6  MCV 83.4 84.8  MCH 27.2 27.4  MCHC 32.6 32.3  RDW 12.8 12.8  PLT 283 233    Cardiac Enzymes Recent Labs  Lab 05/05/20 1446 05/05/20 1718  TROPONINIHS 149* 128*    BNP Recent Labs  Lab 05/05/20 1449  BNP 215.2*     DDimer Recent Labs  Lab 05/05/20 1458  DDIMER 0.49     Radiology    DG Chest 2 View  Result Date: 05/05/2020 CLINICAL DATA:  Chest tightness, short of breath EXAM: CHEST - 2 VIEW COMPARISON:  12/19/2014 FINDINGS: The heart size and mediastinal contours are within normal limits. Both lungs are clear. The visualized skeletal structures are unremarkable. IMPRESSION: No active cardiopulmonary disease. Electronically Signed   By: 02/17/2015 M.D.   On: 05/05/2020 15:26    Patient Profile     74 y.o.  female with a history of hypertension, type 2 diabetes mellitus and hyperlipidemia now presenting with NSTEMI.  Assessment & Plan    1.  NSTEMI, no chest pain or shortness of breath at this time.  High-sensitivity troponin I of 149 down to 128.  ECG shows LVH with poor R wave progression and left anterior fascicular block.  Echocardiogram is pending at this time.  2.  Essential hypertension, currently on lisinopril and Lopressor.  Systolics in the 017-494 range.  3.  Type 2 diabetes mellitus, sliding scale.  4.  Mixed hyperlipidemia, on Lipitor (recent addition with LDL 124) and Zetia.  5.  Lower extremity edema, cardiac structural testing pending.  She is on IV Lasix at this time.  6.  Systolic murmur in aortic position.  Plan is for cardiac catheterization early next week.  Echocardiogram over the weekend to assess cardiac structure and function.  Continue aspirin, Lipitor,  Zetia, lisinopril, Lopressor, and heparin.  Also on IV Lasix, follow urine output and BMET in a.m.  Signed, Rozann Lesches, MD  05/06/2020, 11:26 AM

## 2020-05-06 NOTE — Progress Notes (Signed)
Echocardiogram 2D Echocardiogram has been performed.  Warren Lacy [name] 05/06/2020, 2:44 PM

## 2020-05-06 NOTE — Progress Notes (Signed)
Inpatient Diabetes Program Recommendations  AACE/ADA: New Consensus Statement on Inpatient Glycemic Control (2015)  Target Ranges:  Prepandial:   less than 140 mg/dL      Peak postprandial:   less than 180 mg/dL (1-2 hours)      Critically ill patients:  140 - 180 mg/dL   Lab Results  Component Value Date   GLUCAP 144 (H) 05/06/2020   HGBA1C 11.2 (A) 03/20/2020   HGBA1C 11.2 03/20/2020   HGBA1C 11.2 (A) 03/20/2020   HGBA1C 11.2 (A) 03/20/2020    Review of Glycemic Control  Diabetes history: DM2 Outpatient Diabetes medications: Humalog 75/25 44 units bid, metformin 500 mg bid Current orders for Inpatient glycemic control: Novolog 0-15 units tidwc  HgbA1C - 11.2% - uncontrolled  Inpatient Diabetes Program Recommendations:    Add Lantus 20 units bid Add Novolog 8 units tidwc for meal coverage insulin Add Novolog HS correction  Will order Living Well with Diabetes book and OP Diabetes Education consult when discharged.  Diabetes Coordinator is on call over weekend, although not on campus. Available remotely.  Continue to follow.  Thank you. Ailene Ards, RD, LDN, CDE Inpatient Diabetes Coordinator [phone]

## 2020-05-06 NOTE — Progress Notes (Signed)
Pt is in/out of the recliner to the bed throughout the day, pretty much good urine output, complain of mild pain in leg when she moves, IV heparin contd @11 , denies SOB and distress, will continue to monitor the patient  , RN

## 2020-05-07 DIAGNOSIS — I350 Nonrheumatic aortic (valve) stenosis: Secondary | ICD-10-CM

## 2020-05-07 LAB — BASIC METABOLIC PANEL
Anion gap: 11 (ref 5–15)
BUN: 21 mg/dL (ref 8–23)
CO2: 28 mmol/L (ref 22–32)
Calcium: 8.4 mg/dL — ABNORMAL LOW (ref 8.9–10.3)
Chloride: 96 mmol/L — ABNORMAL LOW (ref 98–111)
Creatinine, Ser: 0.85 mg/dL (ref 0.44–1.00)
GFR calc Af Amer: >60 mL/min (ref >60)
GFR calc non Af Amer: >60 mL/min (ref >60)
Glucose, Bld: 257 mg/dL — ABNORMAL HIGH (ref 70–99)
Potassium: 3.7 mmol/L (ref 3.5–5.1)
Sodium: 135 mmol/L (ref 135–145)

## 2020-05-07 LAB — CBC
HCT: 42 % (ref 36.0–46.0)
Hemoglobin: 13.6 g/dL (ref 12.0–15.0)
MCH: 27.1 pg (ref 26.0–34.0)
MCHC: 32.4 g/dL (ref 30.0–36.0)
MCV: 83.7 fL (ref 80.0–100.0)
Platelets: 209 10*3/uL (ref 150–400)
RBC: 5.02 MIL/uL (ref 3.87–5.11)
RDW: 12.6 % (ref 11.5–15.5)
WBC: 9 10*3/uL (ref 4.0–10.5)
nRBC: 0 % (ref 0.0–0.2)

## 2020-05-07 LAB — GLUCOSE, CAPILLARY
Glucose-Capillary: 202 mg/dL — ABNORMAL HIGH (ref 70–99)
Glucose-Capillary: 263 mg/dL — ABNORMAL HIGH (ref 70–99)
Glucose-Capillary: 195 mg/dL — ABNORMAL HIGH (ref 70–99)
Glucose-Capillary: 234 mg/dL — ABNORMAL HIGH (ref 70–99)

## 2020-05-07 LAB — HEPARIN LEVEL (UNFRACTIONATED): Heparin Unfractionated: 0.32 IU/mL (ref 0.30–0.70)

## 2020-05-07 MED ORDER — INSULIN ASPART 100 UNIT/ML ~~LOC~~ SOLN
0.0000 [IU] | Freq: Every day | SUBCUTANEOUS | Status: DC
  Administered 2020-05-07 – 2020-05-09 (×2): 2 [IU] via SUBCUTANEOUS

## 2020-05-07 MED ORDER — SODIUM CHLORIDE 0.9% FLUSH: 3.0000 mL | INTRAVENOUS | Status: DC | PRN

## 2020-05-07 MED ORDER — SODIUM CHLORIDE 0.9 % IV SOLN: 250.0000 mL | INTRAVENOUS | Status: DC | PRN

## 2020-05-07 MED ORDER — SODIUM CHLORIDE 0.9 % IV SOLN
INTRAVENOUS | Status: DC
  Administered 2020-05-08: 10 mL via INTRAVENOUS

## 2020-05-07 MED ORDER — INSULIN GLARGINE 100 UNIT/ML ~~LOC~~ SOLN
20.0000 [IU] | Freq: Two times a day (BID) | SUBCUTANEOUS | Status: DC
  Administered 2020-05-07 – 2020-05-10 (×5): 20 [IU] via SUBCUTANEOUS
  Filled 2020-05-07 (×8): qty 0.2

## 2020-05-07 MED ORDER — INSULIN ASPART 100 UNIT/ML ~~LOC~~ SOLN
8.0000 [IU] | Freq: Three times a day (TID) | SUBCUTANEOUS | Status: DC
  Administered 2020-05-07 – 2020-05-10 (×7): 8 [IU] via SUBCUTANEOUS

## 2020-05-07 MED ORDER — SODIUM CHLORIDE 0.9% FLUSH
3.0000 mL | Freq: Two times a day (BID) | INTRAVENOUS | Status: DC
  Administered 2020-05-07 – 2020-05-08 (×4): 3 mL via INTRAVENOUS

## 2020-05-07 MED ORDER — INSULIN ASPART 100 UNIT/ML ~~LOC~~ SOLN
0.0000 [IU] | Freq: Three times a day (TID) | SUBCUTANEOUS | Status: DC
  Administered 2020-05-07: 3 [IU] via SUBCUTANEOUS
  Administered 2020-05-08 (×2): 5 [IU] via SUBCUTANEOUS
  Administered 2020-05-08 – 2020-05-09 (×2): 2 [IU] via SUBCUTANEOUS
  Administered 2020-05-09 – 2020-05-10 (×2): 3 [IU] via SUBCUTANEOUS

## 2020-05-07 NOTE — Progress Notes (Signed)
ANTICOAGULATION CONSULT NOTE  Pharmacy Consult for heparin Indication: chest pain/ACS  Allergies  Allergen Reactions  . Morphine Other (See Comments)    'took me out of this world' I had to be resuscitated  . Codeine Sulfate Other (See Comments)    GI upset and pain  . Demerol [Meperidine] Nausea And Vomiting  . Meperidine Hcl Nausea And Vomiting and Swelling  . Propoxyphene Hcl Other (See Comments)    Darvocet caused sick headache    Patient Measurements: Height: 5\' 4"  (162.6 cm) Weight: 85.4 kg (188 lb 4.4 oz) IBW/kg (Calculated) : 54.7 Heparin Dosing Weight: 74.5kg  Vital Signs: Temp: 98 F (36.7 C) (05/30 0433) Temp Source: Oral (05/30 0433) BP: 114/62 (05/30 0433) Pulse Rate: 76 (05/30 0433)  Labs: Recent Labs    05/05/20 1446 05/05/20 1446 05/05/20 1718 05/06/20 0745 05/06/20 0750 05/07/20 0218  HGB 14.2   < >  --   --  14.1 13.6  HCT 43.6  --   --   --  43.6 42.0  PLT 283  --   --   --  233 209  HEPARINUNFRC  --   --   --  0.24*  --  0.32  CREATININE 0.99  --   --   --  0.77 0.85  TROPONINIHS 149*  --  128*  --   --   --    < > = values in this interval not displayed.    Estimated Creatinine Clearance: 62.3 mL/min (by C-G formula based on SCr of 0.85 mg/dL).   Medical History: Past Medical History:  Diagnosis Date  . Achilles tendinitis   . Acute renal failure (ARF) (HCC) 12/19/2014  . Calcaneal spur    right  . Cataract   . Depression   . Diabetes mellitus    type II  . Diverticulosis of colon (without mention of hemorrhage) 2013  . GERD (gastroesophageal reflux disease)   . GI bleed 03/26/14  . Hyperlipidemia   . Hypertension   . Internal hemorrhoids 2013  . Peripheral neuropathy   . Right shoulder pain    Subacromial tendinitis  . Venous insufficiency   . Ventral hernia     Medications:  Infusions:  . sodium chloride    . [START ON 05/08/2020] sodium chloride    . heparin 1,100 Units/hr (05/06/20 1439)    Assessment: 80 yof  presented to the ED with CP. Troponin mildly elevated. To start IV heparin. Baseline CBC is WNL. She is not on anticoagulation PTA.   Initial heparin level therapeutic but at lower end at 0.32, CBC stable, planning cardiac cath this week.   Goal of Therapy:  Heparin level 0.3-0.7 units/ml Monitor platelets by anticoagulation protocol: Yes   Plan:  -Increase heparin to 1200 units/h -Recheck heparin level and CBC in am   66, PharmD, BCPS Clinical Pharmacist [phone] Please check AMION for all [facility] Pharmacy numbers 05/07/2020

## 2020-05-07 NOTE — Progress Notes (Signed)
Progress Note  Patient Name: [name] Date of Encounter: 05/07/2020  Primary Cardiologist: Chilton Si, MD  Subjective   No chest pain or breathlessness at rest.  Leg edema improving but not resolved.  No orthopnea.  Inpatient Medications    Scheduled Meds: . aspirin EC  81 mg Oral Daily  . atorvastatin  40 mg Oral Daily  . citalopram  20 mg Oral Daily  . ezetimibe  10 mg Oral Daily  . furosemide  40 mg Intravenous BID  . gabapentin  300 mg Oral TID  . insulin aspart  0-15 Units Subcutaneous TID WC  . lisinopril  20 mg Oral Daily  . metoprolol tartrate  12.5 mg Oral BID  . pantoprazole  40 mg Oral Daily  . sodium chloride flush  3 mL Intravenous Q12H   Continuous Infusions: . sodium chloride    . [START ON 05/08/2020] sodium chloride    . heparin 1,200 Units/hr (05/07/20 0734)   PRN Meds: sodium chloride, acetaminophen, nitroGLYCERIN, ondansetron (ZOFRAN) IV, sodium chloride flush   Vital Signs    Vitals:   05/06/20 2147 05/06/20 2338 05/07/20 0433 05/07/20 0735  BP: (!) 109/57 114/65 114/62 108/71  Pulse: 78 80 76 78  Resp:  20 16 18   Temp:  98.2 F (36.8 C) 98 F (36.7 C) 98.7 F (37.1 C)  TempSrc:  Oral Oral Oral  SpO2:  99% 98% 98%  Weight:      Height:        Intake/Output Summary (Last 24 hours) at 05/07/2020 0906 Last data filed at 05/07/2020 0735 Gross per 24 hour  Intake 1440 ml  Output 1275 ml  Net 165 ml   Filed Weights   05/05/20 1426 05/05/20 2056  Weight: 88.9 kg 85.4 kg    Telemetry    Sinus rhythm.   Personally reviewed.  ECG    Tracing from 05/07/2020 shows sinus rhythm with left anterior fascicular block and poor R wave progression.  Physical Exam   GEN: No acute distress.   Neck: No JVD. Cardiac: RRR, 2/6 systolic murmur, no gallop.  Respiratory: Nonlabored. Clear to auscultation bilaterally. GI: Soft, nontender, bowel sounds present. MS:  Bilateral lower leg edema; No deformity. Neuro:   Nonfocal. Psych: Alert and oriented x 3. Normal affect.  Labs    Chemistry Recent Labs  Lab 05/05/20 1446 05/06/20 0750 05/07/20 0218  NA 134* 136 135  K 4.7 4.3 3.7  CL 96* 98 96*  CO2 24 29 28   GLUCOSE 223* 258* 257*  BUN 25* 16 21  CREATININE 0.99 0.77 0.85  CALCIUM 8.8* 8.6* 8.4*  GFRNONAA 57* >60 >60  GFRAA >60 >60 >60  ANIONGAP 14 9 11      Hematology Recent Labs  Lab 05/05/20 1446 05/06/20 0750 05/07/20 0218  WBC 10.2 8.2 9.0  RBC 5.23* 5.14* 5.02  HGB 14.2 14.1 13.6  HCT 43.6 43.6 42.0  MCV 83.4 84.8 83.7  MCH 27.2 27.4 27.1  MCHC 32.6 32.3 32.4  RDW 12.8 12.8 12.6  PLT 283 233 209    Cardiac Enzymes Recent Labs  Lab 05/05/20 1446 05/05/20 1718  TROPONINIHS 149* 128*    BNP Recent Labs  Lab 05/05/20 1449  BNP 215.2*     DDimer Recent Labs  Lab 05/05/20 1458  DDIMER 0.49     Radiology    DG Chest 2 View  Result Date: 05/05/2020 CLINICAL DATA:  Chest tightness, short of breath EXAM: CHEST - 2 VIEW COMPARISON:  12/19/2014  FINDINGS: The heart size and mediastinal contours are within normal limits. Both lungs are clear. The visualized skeletal structures are unremarkable. IMPRESSION: No active cardiopulmonary disease. Electronically Signed   By: Donavan Foil M.D.   On: 05/05/2020 15:26   ECHOCARDIOGRAM COMPLETE  Result Date: 05/06/2020    ECHOCARDIOGRAM REPORT   Patient Name:   [name] Date of Exam: 05/06/2020 Medical Rec #:  [id number]             Height:       64.0 in Accession #:    [id number]            Weight:       188.3 lb Date of Birth:  1946/02/24             BSA:          1.907 m Patient Age:    74 years              BP:           97/50 mmHg Patient Gender: F                     HR:           80 bpm. Exam Location:  Inpatient Procedure: 2D Echo, Color Doppler, Cardiac Doppler and Intracardiac            Opacification Agent Indications:    NSTEMI  History:        Patient has prior history of Echocardiogram examinations,  most                 recent 10/08/2012. NSTEMI; Risk Factors:Hypertension, Diabetes                 and Dyslipidemia.  Sonographer:    Raquel Sarna Senior RDCS Referring Phys: [id number] Darreld Mclean  Sonographer Comments: Technically difficult due to poor echo windows and reduced skin integrity. IMPRESSIONS  1. Left ventricular ejection fraction, by estimation, is 55 to 60%. The left ventricle has normal function. The left ventricle has no regional wall motion abnormalities. There is mild concentric left ventricular hypertrophy. Left ventricular diastolic function could not be evaluated.  2. Right ventricular systolic function is normal. The right ventricular size is normal. Tricuspid regurgitation signal is inadequate for assessing PA pressure.  3. The mitral valve is grossly normal. No evidence of mitral valve regurgitation. No evidence of mitral stenosis.  4. Unable to determine valve morphology due to image quality. Aortic valve regurgitation is not visualized. Moderate aortic valve stenosis. Aortic valve area, by VTI measures 0.74 cm. Aortic valve mean gradient measures 32.0 mmHg. Aortic valve Vmax measures 3.47 m/s.  5. The inferior vena cava is normal in size with greater than 50% respiratory variability, suggesting right atrial pressure of 3 mmHg. Comparison(s): Changes from prior study are noted. Aortic stenosis is now moderate. LVEF is normal. FINDINGS  Left Ventricle: Left ventricular ejection fraction, by estimation, is 55 to 60%. The left ventricle has normal function. The left ventricle has no regional wall motion abnormalities. Definity contrast agent was given IV to delineate the left ventricular  endocardial borders. The left ventricular internal cavity size was normal in size. There is mild concentric left ventricular hypertrophy. Left ventricular diastolic function could not be evaluated due to nondiagnostic images. Left ventricular diastolic function could not be evaluated. Right Ventricle: The  right ventricular size is normal. No increase in right ventricular wall thickness. Right ventricular systolic function is normal.  Tricuspid regurgitation signal is inadequate for assessing PA pressure. Left Atrium: Left atrial size was normal in size. Right Atrium: Right atrial size was normal in size. Pericardium: Trivial pericardial effusion is present. Presence of pericardial fat pad. Mitral Valve: The mitral valve is grossly normal. Mild mitral annular calcification. No evidence of mitral valve regurgitation. No evidence of mitral valve stenosis. Tricuspid Valve: The tricuspid valve is grossly normal. Tricuspid valve regurgitation is not demonstrated. No evidence of tricuspid stenosis. Aortic Valve: Unable to determine valve morphology due to image quality.. There is moderate thickening and moderate calcification of the aortic valve. Aortic valve regurgitation is not visualized. Moderate aortic stenosis is present. There is moderate thickening of the aortic valve. There is moderate calcification of the aortic valve. Aortic valve mean gradient measures 32.0 mmHg. Aortic valve peak gradient measures 48.2 mmHg. Aortic valve area, by VTI measures 0.74 cm. Pulmonic Valve: The pulmonic valve was grossly normal. Pulmonic valve regurgitation is not visualized. No evidence of pulmonic stenosis. Aorta: The aortic root and ascending aorta are structurally normal, with no evidence of dilitation. Venous: The inferior vena cava is normal in size with greater than 50% respiratory variability, suggesting right atrial pressure of 3 mmHg. IAS/Shunts: The atrial septum is grossly normal.  LEFT VENTRICLE PLAX 2D LVIDd:         3.80 cm  Diastology LVIDs:         2.30 cm  LV e' lateral:   8.38 cm/s LV PW:         1.00 cm  LV E/e' lateral: 10.6 LV IVS:        1.20 cm  LV e' medial:    4.79 cm/s LVOT diam:     1.90 cm  LV E/e' medial:  18.6 LV SV:         51 LV SV Index:   27 LVOT Area:     2.84 cm  RIGHT VENTRICLE RV S prime:      9.57 cm/s TAPSE (M-mode): 2.0 cm LEFT ATRIUM             Index       RIGHT ATRIUM           Index LA diam:        3.60 cm 1.89 cm/m  RA Area:     11.00 cm LA Vol (A2C):   62.9 ml 32.99 ml/m RA Volume:   24.50 ml  12.85 ml/m LA Vol (A4C):   52.5 ml 27.53 ml/m LA Biplane Vol: 58.8 ml 30.84 ml/m  AORTIC VALVE AV Area (Vmax):    0.62 cm AV Area (Vmean):   0.59 cm AV Area (VTI):     0.74 cm AV Vmax:           347.00 cm/s AV Vmean:          264.000 cm/s AV VTI:            0.690 m AV Peak Grad:      48.2 mmHg AV Mean Grad:      32.0 mmHg LVOT Vmax:         75.40 cm/s LVOT Vmean:        55.300 cm/s LVOT VTI:          0.179 m LVOT/AV VTI ratio: 0.26  AORTA Ao Root diam: 2.60 cm Ao Asc diam:  3.00 cm MITRAL VALVE MV Area (PHT): 3.19 cm    SHUNTS MV Decel Time: 238 msec    Systemic VTI:  0.18  m MV E velocity: 89.10 cm/s  Systemic Diam: 1.90 cm MV A velocity: 90.40 cm/s MV E/A ratio:  0.99 Lennie Odor MD Electronically signed by Lennie Odor MD Signature Date/Time: 05/06/2020/3:10:41 PM    Final     Patient Profile     74 y.o. female with a history of hypertension, type 2 diabetes mellitus and hyperlipidemia now presenting with NSTEMI.  Assessment & Plan    1.  NSTEMI.  High-sensitivity troponin I of 149 down to 128.  No recurrent chest pain or breathlessness at rest.  Follow-up ECG stable.  LVEF 55 to 60% without wall motion abnormalities by echocardiography.  2.  Aortic stenosis, moderate to severe range.  Mean gradient is 32 mmHg with dimensionless index 0.26.  3.  Essential hypertension, currently on lisinopril and Lopressor.  3.  Type 2 diabetes mellitus, sliding scale.  4.  Mixed hyperlipidemia, on Lipitor (recent addition with LDL 124) and Zetia.  5.  Lower extremity edema, improving on IV Lasix.  Plan is for cardiac catheterization early next week, would evaluate aortic stenosis at that time as well particularly if she has surgical coronary anatomy although TAVR may ultimately be a  consideration.  Continue aspirin, Lipitor, Zetia, lisinopril, Lopressor, Lasix and heparin.  Signed, Nona Dell, MD  05/07/2020, 9:06 AM

## 2020-05-07 NOTE — Progress Notes (Signed)
Pt is stable, vitals been stable throughout the time, complain of leg pain and covered with gabapentin and scheduled tylenol, get out of the bed to the recliner for a while this morning, denies chest pain and SOB, leg swelling +2, urine output recorded, will continue to monitor the patient   Lonia Farber, RN

## 2020-05-08 LAB — CBC
HCT: 43.5 % (ref 36.0–46.0)
Hemoglobin: 14.2 g/dL (ref 12.0–15.0)
MCH: 27.5 pg (ref 26.0–34.0)
MCHC: 32.6 g/dL (ref 30.0–36.0)
MCV: 84.1 fL (ref 80.0–100.0)
Platelets: 229 10*3/uL (ref 150–400)
RBC: 5.17 MIL/uL — ABNORMAL HIGH (ref 3.87–5.11)
RDW: 12.6 % (ref 11.5–15.5)
WBC: 10.4 10*3/uL (ref 4.0–10.5)
nRBC: 0 % (ref 0.0–0.2)

## 2020-05-08 LAB — BASIC METABOLIC PANEL
Anion gap: 8 (ref 5–15)
BUN: 21 mg/dL (ref 8–23)
CO2: 33 mmol/L — ABNORMAL HIGH (ref 22–32)
Calcium: 8.6 mg/dL — ABNORMAL LOW (ref 8.9–10.3)
Chloride: 96 mmol/L — ABNORMAL LOW (ref 98–111)
Creatinine, Ser: 1.1 mg/dL — ABNORMAL HIGH (ref 0.44–1.00)
GFR calc Af Amer: 58 mL/min — ABNORMAL LOW (ref >60)
GFR calc non Af Amer: 50 mL/min — ABNORMAL LOW (ref >60)
Glucose, Bld: 215 mg/dL — ABNORMAL HIGH (ref 70–99)
Potassium: 4.9 mmol/L (ref 3.5–5.1)
Sodium: 137 mmol/L (ref 135–145)

## 2020-05-08 LAB — GLUCOSE, CAPILLARY
Glucose-Capillary: 204 mg/dL — ABNORMAL HIGH (ref 70–99)
Glucose-Capillary: 201 mg/dL — ABNORMAL HIGH (ref 70–99)
Glucose-Capillary: 146 mg/dL — ABNORMAL HIGH (ref 70–99)
Glucose-Capillary: 132 mg/dL — ABNORMAL HIGH (ref 70–99)

## 2020-05-08 LAB — HEPARIN LEVEL (UNFRACTIONATED): Heparin Unfractionated: 0.53 IU/mL (ref 0.30–0.70)

## 2020-05-08 MED ORDER — GERHARDT'S BUTT CREAM
TOPICAL_CREAM | Freq: Four times a day (QID) | CUTANEOUS | Status: DC | PRN
  Filled 2020-05-08: qty 1

## 2020-05-08 NOTE — Progress Notes (Signed)
Educated pt about cardiac cath in am. Unable to show video.  No questions at this time.  Consent obtained.  Karena Addison T

## 2020-05-08 NOTE — Progress Notes (Signed)
ANTICOAGULATION CONSULT NOTE  Pharmacy Consult for heparin Indication: chest pain/ACS  Allergies  Allergen Reactions  . Morphine Other (See Comments)    'took me out of this world' I had to be resuscitated  . Codeine Sulfate Other (See Comments)    GI upset and pain  . Demerol [Meperidine] Nausea And Vomiting  . Meperidine Hcl Nausea And Vomiting and Swelling  . Propoxyphene Hcl Other (See Comments)    Darvocet caused sick headache    Patient Measurements: Height: 5\' 4"  (162.6 cm) Weight: 82.6 kg (182 lb 1.6 oz) IBW/kg (Calculated) : 54.7 Heparin Dosing Weight: 74.5kg  Vital Signs: Temp: 98.2 F (36.8 C) (05/31 0414) Temp Source: Oral (05/31 0414) BP: 105/72 (05/31 0414) Pulse Rate: 71 (05/30 2039)  Labs: Recent Labs    05/05/20 1446 05/05/20 1446 05/05/20 1718 05/06/20 0745 05/06/20 0750 05/06/20 0750 05/07/20 0218 05/08/20 0236 05/08/20 0238  HGB 14.2   < >  --   --  14.1   < > 13.6 14.2  --   HCT 43.6   < >  --   --  43.6  --  42.0 43.5  --   PLT 283   < >  --   --  233  --  209 229  --   HEPARINUNFRC  --   --   --  0.24*  --   --  0.32  --  0.53  CREATININE 0.99   < >  --   --  0.77  --  0.85 1.10*  --   TROPONINIHS 149*  --  128*  --   --   --   --   --   --    < > = values in this interval not displayed.    Estimated Creatinine Clearance: 47.4 mL/min (A) (by C-G formula based on SCr of 1.1 mg/dL (H)).   Medical History: Past Medical History:  Diagnosis Date  . Achilles tendinitis   . Acute renal failure (ARF) (HCC) 12/19/2014  . Calcaneal spur    right  . Cataract   . Depression   . Diabetes mellitus    type II  . Diverticulosis of colon (without mention of hemorrhage) 2013  . GERD (gastroesophageal reflux disease)   . GI bleed 03/26/14  . Hyperlipidemia   . Hypertension   . Internal hemorrhoids 2013  . Peripheral neuropathy   . Right shoulder pain    Subacromial tendinitis  . Venous insufficiency   . Ventral hernia     Medications:   Infusions:  . sodium chloride    . sodium chloride    . heparin 1,200 Units/hr (05/07/20 1100)    Assessment: 74 yof presented to the ED with CP. Troponin mildly elevated. To start IV heparin. Baseline CBC is WNL. She is not on anticoagulation PTA.   Heparin level remains therapeutic at 0.53, CBC stable.   Goal of Therapy:  Heparin level 0.3-0.7 units/ml Monitor platelets by anticoagulation protocol: Yes   Plan:  -Continue heparin 1200 units/h -Daily heparin level and CBC   05/09/20, PharmD, BCPS Clinical Pharmacist [phone] Please check AMION for all [facility] numbers 05/08/2020

## 2020-05-08 NOTE — H&P (View-Only) (Signed)
Progress Note  Patient Name: [name] Date of Encounter: 05/08/2020  Primary Cardiologist: Skeet Latch, MD  Subjective   No chest pain.   Inpatient Medications    Scheduled Meds: . aspirin EC  81 mg Oral Daily  . atorvastatin  40 mg Oral Daily  . citalopram  20 mg Oral Daily  . ezetimibe  10 mg Oral Daily  . furosemide  40 mg Intravenous BID  . gabapentin  300 mg Oral TID  . insulin aspart  0-15 Units Subcutaneous TID WC  . insulin aspart  0-5 Units Subcutaneous QHS  . insulin aspart  8 Units Subcutaneous TID WC  . insulin glargine  20 Units Subcutaneous BID  . lisinopril  20 mg Oral Daily  . metoprolol tartrate  12.5 mg Oral BID  . pantoprazole  40 mg Oral Daily  . sodium chloride flush  3 mL Intravenous Q12H   Continuous Infusions: . sodium chloride    . sodium chloride    . heparin 1,200 Units/hr (05/07/20 1100)   PRN Meds: sodium chloride, acetaminophen, Gerhardt's butt cream, nitroGLYCERIN, ondansetron (ZOFRAN) IV, sodium chloride flush   Vital Signs    Vitals:   05/07/20 2307 05/08/20 0414 05/08/20 0623 05/08/20 0734  BP: (!) 105/59 105/72  100/64  Pulse:    66  Resp: 20 18  15   Temp: 97.7 F (36.5 C) 98.2 F (36.8 C)  98.1 F (36.7 C)  TempSrc: Oral Oral  Oral  SpO2: 96% 94%  99%  Weight:   82.6 kg   Height:        Intake/Output Summary (Last 24 hours) at 05/08/2020 0738 Last data filed at 05/08/2020 0700 Gross per 24 hour  Intake 1611.2 ml  Output 1950 ml  Net -338.8 ml   Filed Weights   05/05/20 1426 05/05/20 2056 05/08/20 0623  Weight: 88.9 kg 85.4 kg 82.6 kg    Telemetry    Sinus- Personally reviewed.  ECG    No AM EKG  Physical Exam   General: Well developed, well nourished, NAD  HEENT: OP clear, mucus membranes moist  SKIN: warm, dry. No rashes. Neuro: No focal deficits  Musculoskeletal: Muscle strength 5/5 all ext  Psychiatric: Mood and affect normal  Neck: No JVD, no carotid bruits, no thyromegaly, no  lymphadenopathy.  Lungs:Clear bilaterally, no wheezes, rhonci, crackles Cardiovascular: Regular rate and rhythm. Systolic murmur.  Abdomen:Soft. Bowel sounds present. Non-tender.  Extremities: No lower extremity edema. Pulses are 2 + in the bilateral DP/PT.   Labs    Chemistry Recent Labs  Lab 05/06/20 0750 05/07/20 0218 05/08/20 0236  NA 136 135 137  K 4.3 3.7 4.9  CL 98 96* 96*  CO2 29 28 33*  GLUCOSE 258* 257* 215*  BUN 16 21 21   CREATININE 0.77 0.85 1.10*  CALCIUM 8.6* 8.4* 8.6*  GFRNONAA >60 >60 50*  GFRAA >60 >60 58*  ANIONGAP 9 11 8      Hematology Recent Labs  Lab 05/06/20 0750 05/07/20 0218 05/08/20 0236  WBC 8.2 9.0 10.4  RBC 5.14* 5.02 5.17*  HGB 14.1 13.6 14.2  HCT 43.6 42.0 43.5  MCV 84.8 83.7 84.1  MCH 27.4 27.1 27.5  MCHC 32.3 32.4 32.6  RDW 12.8 12.6 12.6  PLT 233 209 229    Cardiac Enzymes Recent Labs  Lab 05/05/20 1446 05/05/20 1718  TROPONINIHS 149* 128*    BNP Recent Labs  Lab 05/05/20 1449  BNP 215.2*     DDimer Recent Labs  Lab 05/05/20 1458  DDIMER 0.49     Radiology    ECHOCARDIOGRAM COMPLETE  Result Date: 05/06/2020    ECHOCARDIOGRAM REPORT   Patient Name:   [name] Date of Exam: 05/06/2020 Medical Rec #:  [id number]             Height:       64.0 in Accession #:    [id number]            Weight:       188.3 lb Date of Birth:  05-15-1946             BSA:          1.907 m Patient Age:    74 years              BP:           97/50 mmHg Patient Gender: F                     HR:           80 bpm. Exam Location:  Inpatient Procedure: 2D Echo, Color Doppler, Cardiac Doppler and Intracardiac            Opacification Agent Indications:    NSTEMI  History:        Patient has prior history of Echocardiogram examinations, most                 recent 10/08/2012. NSTEMI; Risk Factors:Hypertension, Diabetes                 and Dyslipidemia.  Sonographer:    Irving Burton Senior RDCS Referring Phys: [id number] Corrin Parker   Sonographer Comments: Technically difficult due to poor echo windows and reduced skin integrity. IMPRESSIONS  1. Left ventricular ejection fraction, by estimation, is 55 to 60%. The left ventricle has normal function. The left ventricle has no regional wall motion abnormalities. There is mild concentric left ventricular hypertrophy. Left ventricular diastolic function could not be evaluated.  2. Right ventricular systolic function is normal. The right ventricular size is normal. Tricuspid regurgitation signal is inadequate for assessing PA pressure.  3. The mitral valve is grossly normal. No evidence of mitral valve regurgitation. No evidence of mitral stenosis.  4. Unable to determine valve morphology due to image quality. Aortic valve regurgitation is not visualized. Moderate aortic valve stenosis. Aortic valve area, by VTI measures 0.74 cm. Aortic valve mean gradient measures 32.0 mmHg. Aortic valve Vmax measures 3.47 m/s.  5. The inferior vena cava is normal in size with greater than 50% respiratory variability, suggesting right atrial pressure of 3 mmHg. Comparison(s): Changes from prior study are noted. Aortic stenosis is now moderate. LVEF is normal. FINDINGS  Left Ventricle: Left ventricular ejection fraction, by estimation, is 55 to 60%. The left ventricle has normal function. The left ventricle has no regional wall motion abnormalities. Definity contrast agent was given IV to delineate the left ventricular  endocardial borders. The left ventricular internal cavity size was normal in size. There is mild concentric left ventricular hypertrophy. Left ventricular diastolic function could not be evaluated due to nondiagnostic images. Left ventricular diastolic function could not be evaluated. Right Ventricle: The right ventricular size is normal. No increase in right ventricular wall thickness. Right ventricular systolic function is normal. Tricuspid regurgitation signal is inadequate for assessing PA  pressure. Left Atrium: Left atrial size was normal in size. Right Atrium: Right atrial size was normal in size. Pericardium:  Trivial pericardial effusion is present. Presence of pericardial fat pad. Mitral Valve: The mitral valve is grossly normal. Mild mitral annular calcification. No evidence of mitral valve regurgitation. No evidence of mitral valve stenosis. Tricuspid Valve: The tricuspid valve is grossly normal. Tricuspid valve regurgitation is not demonstrated. No evidence of tricuspid stenosis. Aortic Valve: Unable to determine valve morphology due to image quality.. There is moderate thickening and moderate calcification of the aortic valve. Aortic valve regurgitation is not visualized. Moderate aortic stenosis is present. There is moderate thickening of the aortic valve. There is moderate calcification of the aortic valve. Aortic valve mean gradient measures 32.0 mmHg. Aortic valve peak gradient measures 48.2 mmHg. Aortic valve area, by VTI measures 0.74 cm. Pulmonic Valve: The pulmonic valve was grossly normal. Pulmonic valve regurgitation is not visualized. No evidence of pulmonic stenosis. Aorta: The aortic root and ascending aorta are structurally normal, with no evidence of dilitation. Venous: The inferior vena cava is normal in size with greater than 50% respiratory variability, suggesting right atrial pressure of 3 mmHg. IAS/Shunts: The atrial septum is grossly normal.  LEFT VENTRICLE PLAX 2D LVIDd:         3.80 cm  Diastology LVIDs:         2.30 cm  LV e' lateral:   8.38 cm/s LV PW:         1.00 cm  LV E/e' lateral: 10.6 LV IVS:        1.20 cm  LV e' medial:    4.79 cm/s LVOT diam:     1.90 cm  LV E/e' medial:  18.6 LV SV:         51 LV SV Index:   27 LVOT Area:     2.84 cm  RIGHT VENTRICLE RV S prime:     9.57 cm/s TAPSE (M-mode): 2.0 cm LEFT ATRIUM             Index       RIGHT ATRIUM           Index LA diam:        3.60 cm 1.89 cm/m  RA Area:     11.00 cm LA Vol (A2C):   62.9 ml 32.99 ml/m  RA Volume:   24.50 ml  12.85 ml/m LA Vol (A4C):   52.5 ml 27.53 ml/m LA Biplane Vol: 58.8 ml 30.84 ml/m  AORTIC VALVE AV Area (Vmax):    0.62 cm AV Area (Vmean):   0.59 cm AV Area (VTI):     0.74 cm AV Vmax:           347.00 cm/s AV Vmean:          264.000 cm/s AV VTI:            0.690 m AV Peak Grad:      48.2 mmHg AV Mean Grad:      32.0 mmHg LVOT Vmax:         75.40 cm/s LVOT Vmean:        55.300 cm/s LVOT VTI:          0.179 m LVOT/AV VTI ratio: 0.26  AORTA Ao Root diam: 2.60 cm Ao Asc diam:  3.00 cm MITRAL VALVE MV Area (PHT): 3.19 cm    SHUNTS MV Decel Time: 238 msec    Systemic VTI:  0.18 m MV E velocity: 89.10 cm/s  Systemic Diam: 1.90 cm MV A velocity: 90.40 cm/s MV E/A ratio:  0.99 Schurz O'Neal MD Electronically signed by [place]   O'Neal MD Signature Date/Time: 05/06/2020/3:10:41 PM    Final     Patient Profile     74 y.o. female with a history of hypertension, type 2 diabetes mellitus and hyperlipidemia now presenting with NSTEMI.  Assessment & Plan    1.  NSTEMI.  High-sensitivity troponin I peak at 149 then trending down to 128.  LVEF 55 to 60% without wall motion abnormalities by echocardiography. No chest pain. Plan for right and left heart cath tomorrow. NPO at midnight. Continue heparin, ASA, statin and beta blocker.   2.  Moderate to severe Aortic stenosis: this is likely contributing to her presentation. If she does not have surgical CAD, will consider TAVR workup.    3.  Essential hypertension: BP controlled on current therapy. No changes today  3.  Type 2 diabetes mellitus, sliding scale.  4.  Mixed hyperlipidemia: Continue statin and Zetia.   5.  Acute on chronic diastolic CHF: She is down 14 lbs since admission. Still with LE edema. Continue IV Lasix today. Will hold tomorrow mornings Lasix dose prior to cardiac cath.    Signed, Verne Carrow, MD  05/08/2020, 7:38 AM

## 2020-05-08 NOTE — Progress Notes (Signed)
Progress Note  Patient Name: [name] Date of Encounter: 05/08/2020  Primary Cardiologist: Skeet Latch, MD  Subjective   No chest pain.   Inpatient Medications    Scheduled Meds: . aspirin EC  81 mg Oral Daily  . atorvastatin  40 mg Oral Daily  . citalopram  20 mg Oral Daily  . ezetimibe  10 mg Oral Daily  . furosemide  40 mg Intravenous BID  . gabapentin  300 mg Oral TID  . insulin aspart  0-15 Units Subcutaneous TID WC  . insulin aspart  0-5 Units Subcutaneous QHS  . insulin aspart  8 Units Subcutaneous TID WC  . insulin glargine  20 Units Subcutaneous BID  . lisinopril  20 mg Oral Daily  . metoprolol tartrate  12.5 mg Oral BID  . pantoprazole  40 mg Oral Daily  . sodium chloride flush  3 mL Intravenous Q12H   Continuous Infusions: . sodium chloride    . sodium chloride    . heparin 1,200 Units/hr (05/07/20 1100)   PRN Meds: sodium chloride, acetaminophen, Gerhardt's butt cream, nitroGLYCERIN, ondansetron (ZOFRAN) IV, sodium chloride flush   Vital Signs    Vitals:   05/07/20 2307 05/08/20 0414 05/08/20 0623 05/08/20 0734  BP: (!) 105/59 105/72  100/64  Pulse:    66  Resp: 20 18  15   Temp: 97.7 F (36.5 C) 98.2 F (36.8 C)  98.1 F (36.7 C)  TempSrc: Oral Oral  Oral  SpO2: 96% 94%  99%  Weight:   82.6 kg   Height:        Intake/Output Summary (Last 24 hours) at 05/08/2020 0738 Last data filed at 05/08/2020 0700 Gross per 24 hour  Intake 1611.2 ml  Output 1950 ml  Net -338.8 ml   Filed Weights   05/05/20 1426 05/05/20 2056 05/08/20 0623  Weight: 88.9 kg 85.4 kg 82.6 kg    Telemetry    Sinus- Personally reviewed.  ECG    No AM EKG  Physical Exam   General: Well developed, well nourished, NAD  HEENT: OP clear, mucus membranes moist  SKIN: warm, dry. No rashes. Neuro: No focal deficits  Musculoskeletal: Muscle strength 5/5 all ext  Psychiatric: Mood and affect normal  Neck: No JVD, no carotid bruits, no thyromegaly, no  lymphadenopathy.  Lungs:Clear bilaterally, no wheezes, rhonci, crackles Cardiovascular: Regular rate and rhythm. Systolic murmur.  Abdomen:Soft. Bowel sounds present. Non-tender.  Extremities: No lower extremity edema. Pulses are 2 + in the bilateral DP/PT.   Labs    Chemistry Recent Labs  Lab 05/06/20 0750 05/07/20 0218 05/08/20 0236  NA 136 135 137  K 4.3 3.7 4.9  CL 98 96* 96*  CO2 29 28 33*  GLUCOSE 258* 257* 215*  BUN 16 21 21   CREATININE 0.77 0.85 1.10*  CALCIUM 8.6* 8.4* 8.6*  GFRNONAA >60 >60 50*  GFRAA >60 >60 58*  ANIONGAP 9 11 8      Hematology Recent Labs  Lab 05/06/20 0750 05/07/20 0218 05/08/20 0236  WBC 8.2 9.0 10.4  RBC 5.14* 5.02 5.17*  HGB 14.1 13.6 14.2  HCT 43.6 42.0 43.5  MCV 84.8 83.7 84.1  MCH 27.4 27.1 27.5  MCHC 32.3 32.4 32.6  RDW 12.8 12.6 12.6  PLT 233 209 229    Cardiac Enzymes Recent Labs  Lab 05/05/20 1446 05/05/20 1718  TROPONINIHS 149* 128*    BNP Recent Labs  Lab 05/05/20 1449  BNP 215.2*     DDimer Recent Labs  Lab 05/05/20 1458  DDIMER 0.49     Radiology    ECHOCARDIOGRAM COMPLETE  Result Date: 05/06/2020    ECHOCARDIOGRAM REPORT   Patient Name:   [name] Date of Exam: 05/06/2020 Medical Rec #:  [id number]             Height:       64.0 in Accession #:    [id number]            Weight:       188.3 lb Date of Birth:  05-15-1946             BSA:          1.907 m Patient Age:    74 years              BP:           97/50 mmHg Patient Gender: F                     HR:           80 bpm. Exam Location:  Inpatient Procedure: 2D Echo, Color Doppler, Cardiac Doppler and Intracardiac            Opacification Agent Indications:    NSTEMI  History:        Patient has prior history of Echocardiogram examinations, most                 recent 10/08/2012. NSTEMI; Risk Factors:Hypertension, Diabetes                 and Dyslipidemia.  Sonographer:    Irving Burton Senior RDCS Referring Phys: [id number] Corrin Parker   Sonographer Comments: Technically difficult due to poor echo windows and reduced skin integrity. IMPRESSIONS  1. Left ventricular ejection fraction, by estimation, is 55 to 60%. The left ventricle has normal function. The left ventricle has no regional wall motion abnormalities. There is mild concentric left ventricular hypertrophy. Left ventricular diastolic function could not be evaluated.  2. Right ventricular systolic function is normal. The right ventricular size is normal. Tricuspid regurgitation signal is inadequate for assessing PA pressure.  3. The mitral valve is grossly normal. No evidence of mitral valve regurgitation. No evidence of mitral stenosis.  4. Unable to determine valve morphology due to image quality. Aortic valve regurgitation is not visualized. Moderate aortic valve stenosis. Aortic valve area, by VTI measures 0.74 cm. Aortic valve mean gradient measures 32.0 mmHg. Aortic valve Vmax measures 3.47 m/s.  5. The inferior vena cava is normal in size with greater than 50% respiratory variability, suggesting right atrial pressure of 3 mmHg. Comparison(s): Changes from prior study are noted. Aortic stenosis is now moderate. LVEF is normal. FINDINGS  Left Ventricle: Left ventricular ejection fraction, by estimation, is 55 to 60%. The left ventricle has normal function. The left ventricle has no regional wall motion abnormalities. Definity contrast agent was given IV to delineate the left ventricular  endocardial borders. The left ventricular internal cavity size was normal in size. There is mild concentric left ventricular hypertrophy. Left ventricular diastolic function could not be evaluated due to nondiagnostic images. Left ventricular diastolic function could not be evaluated. Right Ventricle: The right ventricular size is normal. No increase in right ventricular wall thickness. Right ventricular systolic function is normal. Tricuspid regurgitation signal is inadequate for assessing PA  pressure. Left Atrium: Left atrial size was normal in size. Right Atrium: Right atrial size was normal in size. Pericardium:  Trivial pericardial effusion is present. Presence of pericardial fat pad. Mitral Valve: The mitral valve is grossly normal. Mild mitral annular calcification. No evidence of mitral valve regurgitation. No evidence of mitral valve stenosis. Tricuspid Valve: The tricuspid valve is grossly normal. Tricuspid valve regurgitation is not demonstrated. No evidence of tricuspid stenosis. Aortic Valve: Unable to determine valve morphology due to image quality.. There is moderate thickening and moderate calcification of the aortic valve. Aortic valve regurgitation is not visualized. Moderate aortic stenosis is present. There is moderate thickening of the aortic valve. There is moderate calcification of the aortic valve. Aortic valve mean gradient measures 32.0 mmHg. Aortic valve peak gradient measures 48.2 mmHg. Aortic valve area, by VTI measures 0.74 cm. Pulmonic Valve: The pulmonic valve was grossly normal. Pulmonic valve regurgitation is not visualized. No evidence of pulmonic stenosis. Aorta: The aortic root and ascending aorta are structurally normal, with no evidence of dilitation. Venous: The inferior vena cava is normal in size with greater than 50% respiratory variability, suggesting right atrial pressure of 3 mmHg. IAS/Shunts: The atrial septum is grossly normal.  LEFT VENTRICLE PLAX 2D LVIDd:         3.80 cm  Diastology LVIDs:         2.30 cm  LV e' lateral:   8.38 cm/s LV PW:         1.00 cm  LV E/e' lateral: 10.6 LV IVS:        1.20 cm  LV e' medial:    4.79 cm/s LVOT diam:     1.90 cm  LV E/e' medial:  18.6 LV SV:         51 LV SV Index:   27 LVOT Area:     2.84 cm  RIGHT VENTRICLE RV S prime:     9.57 cm/s TAPSE (M-mode): 2.0 cm LEFT ATRIUM             Index       RIGHT ATRIUM           Index LA diam:        3.60 cm 1.89 cm/m  RA Area:     11.00 cm LA Vol (A2C):   62.9 ml 32.99 ml/m  RA Volume:   24.50 ml  12.85 ml/m LA Vol (A4C):   52.5 ml 27.53 ml/m LA Biplane Vol: 58.8 ml 30.84 ml/m  AORTIC VALVE AV Area (Vmax):    0.62 cm AV Area (Vmean):   0.59 cm AV Area (VTI):     0.74 cm AV Vmax:           347.00 cm/s AV Vmean:          264.000 cm/s AV VTI:            0.690 m AV Peak Grad:      48.2 mmHg AV Mean Grad:      32.0 mmHg LVOT Vmax:         75.40 cm/s LVOT Vmean:        55.300 cm/s LVOT VTI:          0.179 m LVOT/AV VTI ratio: 0.26  AORTA Ao Root diam: 2.60 cm Ao Asc diam:  3.00 cm MITRAL VALVE MV Area (PHT): 3.19 cm    SHUNTS MV Decel Time: 238 msec    Systemic VTI:  0.18 m MV E velocity: 89.10 cm/s  Systemic Diam: 1.90 cm MV A velocity: 90.40 cm/s MV E/A ratio:  0.99 Lennie Odor MD Electronically signed by Gerri Spore  O'Neal MD Signature Date/Time: 05/06/2020/3:10:41 PM    Final     Patient Profile     74 y.o. female with a history of hypertension, type 2 diabetes mellitus and hyperlipidemia now presenting with NSTEMI.  Assessment & Plan    1.  NSTEMI.  High-sensitivity troponin I peak at 149 then trending down to 128.  LVEF 55 to 60% without wall motion abnormalities by echocardiography. No chest pain. Plan for right and left heart cath tomorrow. NPO at midnight. Continue heparin, ASA, statin and beta blocker.   2.  Moderate to severe Aortic stenosis: this is likely contributing to her presentation. If she does not have surgical CAD, will consider TAVR workup.    3.  Essential hypertension: BP controlled on current therapy. No changes today  3.  Type 2 diabetes mellitus, sliding scale.  4.  Mixed hyperlipidemia: Continue statin and Zetia.   5.  Acute on chronic diastolic CHF: She is down 14 lbs since admission. Still with LE edema. Continue IV Lasix today. Will hold tomorrow mornings Lasix dose prior to cardiac cath.    Signed, Verne Carrow, MD  05/08/2020, 7:38 AM

## 2020-05-09 ENCOUNTER — Other Ambulatory Visit: Payer: Self-pay | Admitting: Physician Assistant

## 2020-05-09 ENCOUNTER — Encounter (HOSPITAL_COMMUNITY)
Admission: EM | Disposition: A | Discharge status: Home / Self Care | Payer: Self-pay | Source: Home / Self Care | Attending: Cardiology

## 2020-05-09 ENCOUNTER — Encounter: Payer: Self-pay | Admitting: Physician Assistant

## 2020-05-09 DIAGNOSIS — I350 Nonrheumatic aortic (valve) stenosis: Secondary | ICD-10-CM

## 2020-05-09 DIAGNOSIS — R778 Other specified abnormalities of plasma proteins: Secondary | ICD-10-CM

## 2020-05-09 HISTORY — PX: RIGHT/LEFT HEART CATH AND CORONARY ANGIOGRAPHY: CATH118266

## 2020-05-09 LAB — BASIC METABOLIC PANEL
Anion gap: 10 (ref 5–15)
BUN: 17 mg/dL (ref 8–23)
CO2: 30 mmol/L (ref 22–32)
Calcium: 8.5 mg/dL — ABNORMAL LOW (ref 8.9–10.3)
Chloride: 98 mmol/L (ref 98–111)
Creatinine, Ser: 0.75 mg/dL (ref 0.44–1.00)
GFR calc Af Amer: >60 mL/min (ref >60)
GFR calc non Af Amer: >60 mL/min (ref >60)
Glucose, Bld: 135 mg/dL — ABNORMAL HIGH (ref 70–99)
Potassium: 3.1 mmol/L — ABNORMAL LOW (ref 3.5–5.1)
Sodium: 138 mmol/L (ref 135–145)

## 2020-05-09 LAB — POCT I-STAT 7, (LYTES, BLD GAS, ICA,H+H)
Acid-Base Excess: 8 mmol/L — ABNORMAL HIGH (ref 0.0–2.0)
Bicarbonate: 35.2 mmol/L — ABNORMAL HIGH (ref 20.0–28.0)
Calcium, Ion: 1.18 mmol/L (ref 1.15–1.40)
HCT: 43 % (ref 36.0–46.0)
Hemoglobin: 14.6 g/dL (ref 12.0–15.0)
O2 Saturation: 99 %
Potassium: 3.5 mmol/L (ref 3.5–5.1)
Sodium: 139 mmol/L (ref 135–145)
TCO2: 37 mmol/L — ABNORMAL HIGH (ref 22–32)
pCO2 arterial: 58.6 mmHg — ABNORMAL HIGH (ref 32.0–48.0)
pH, Arterial: 7.386 (ref 7.350–7.450)
pO2, Arterial: 128 mmHg — ABNORMAL HIGH (ref 83.0–108.0)

## 2020-05-09 LAB — CBC
HCT: 44.8 % (ref 36.0–46.0)
Hemoglobin: 14.6 g/dL (ref 12.0–15.0)
MCH: 27.3 pg (ref 26.0–34.0)
MCHC: 32.6 g/dL (ref 30.0–36.0)
MCV: 83.7 fL (ref 80.0–100.0)
Platelets: 215 10*3/uL (ref 150–400)
RBC: 5.35 MIL/uL — ABNORMAL HIGH (ref 3.87–5.11)
RDW: 12.7 % (ref 11.5–15.5)
WBC: 10 10*3/uL (ref 4.0–10.5)
nRBC: 0 % (ref 0.0–0.2)

## 2020-05-09 LAB — MAGNESIUM: Magnesium: 1.7 mg/dL (ref 1.7–2.4)

## 2020-05-09 LAB — GLUCOSE, CAPILLARY
Glucose-Capillary: 125 mg/dL — ABNORMAL HIGH (ref 70–99)
Glucose-Capillary: 117 mg/dL — ABNORMAL HIGH (ref 70–99)
Glucose-Capillary: 169 mg/dL — ABNORMAL HIGH (ref 70–99)
Glucose-Capillary: 230 mg/dL — ABNORMAL HIGH (ref 70–99)

## 2020-05-09 LAB — HEPARIN LEVEL (UNFRACTIONATED): Heparin Unfractionated: 0.48 IU/mL (ref 0.30–0.70)

## 2020-05-09 LAB — POCT I-STAT EG7
Acid-Base Excess: 8 mmol/L — ABNORMAL HIGH (ref 0.0–2.0)
Bicarbonate: 35.1 mmol/L — ABNORMAL HIGH (ref 20.0–28.0)
Calcium, Ion: 1.16 mmol/L (ref 1.15–1.40)
HCT: 41 % (ref 36.0–46.0)
Hemoglobin: 13.9 g/dL (ref 12.0–15.0)
O2 Saturation: 68 %
Potassium: 3.4 mmol/L — ABNORMAL LOW (ref 3.5–5.1)
Sodium: 138 mmol/L (ref 135–145)
TCO2: 37 mmol/L — ABNORMAL HIGH (ref 22–32)
pCO2, Ven: 60 mmHg (ref 44.0–60.0)
pH, Ven: 7.375 (ref 7.250–7.430)
pO2, Ven: 37 mmHg (ref 32.0–45.0)

## 2020-05-09 LAB — POCT ACTIVATED CLOTTING TIME: Activated Clotting Time: 202 seconds

## 2020-05-09 SURGERY — RIGHT/LEFT HEART CATH AND CORONARY ANGIOGRAPHY
Anesthesia: LOCAL

## 2020-05-09 MED ORDER — ACETAMINOPHEN 325 MG PO TABS: 650.0000 mg | ORAL_TABLET | ORAL | Status: DC | PRN

## 2020-05-09 MED ORDER — MIDAZOLAM HCL 2 MG/2ML IJ SOLN
INTRAMUSCULAR | Status: AC
  Filled 2020-05-09: qty 2

## 2020-05-09 MED ORDER — LIDOCAINE HCL (PF) 1 % IJ SOLN
INTRAMUSCULAR | Status: AC
  Filled 2020-05-09: qty 30

## 2020-05-09 MED ORDER — SODIUM CHLORIDE 0.9 % IV SOLN: 250.0000 mL | INTRAVENOUS | Status: DC | PRN

## 2020-05-09 MED ORDER — SODIUM CHLORIDE 0.9% FLUSH: 3.0000 mL | INTRAVENOUS | Status: DC | PRN

## 2020-05-09 MED ORDER — LABETALOL HCL 5 MG/ML IV SOLN: 10.0000 mg | INTRAVENOUS | Status: AC | PRN

## 2020-05-09 MED ORDER — VERAPAMIL HCL 2.5 MG/ML IV SOLN
INTRAVENOUS | Status: AC
  Filled 2020-05-09: qty 2

## 2020-05-09 MED ORDER — HEPARIN SODIUM (PORCINE) 1000 UNIT/ML IJ SOLN
INTRAMUSCULAR | Status: AC
  Filled 2020-05-09: qty 1

## 2020-05-09 MED ORDER — SODIUM CHLORIDE 0.9 % IV SOLN: INTRAVENOUS | Status: AC

## 2020-05-09 MED ORDER — ONDANSETRON HCL 4 MG/2ML IJ SOLN
INTRAMUSCULAR | Status: DC | PRN
  Administered 2020-05-09: 4 mg via INTRAVENOUS

## 2020-05-09 MED ORDER — FENTANYL CITRATE (PF) 100 MCG/2ML IJ SOLN
INTRAMUSCULAR | Status: DC | PRN
  Administered 2020-05-09 (×2): 25 ug via INTRAVENOUS

## 2020-05-09 MED ORDER — HYDRALAZINE HCL 20 MG/ML IJ SOLN: 10.0000 mg | INTRAMUSCULAR | Status: AC | PRN

## 2020-05-09 MED ORDER — METOPROLOL TARTRATE 25 MG PO TABS: 12.5000 mg | ORAL_TABLET | Freq: Two times a day (BID) | ORAL | 2 refills | Status: DC

## 2020-05-09 MED ORDER — POTASSIUM CHLORIDE CRYS ER 20 MEQ PO TBCR
40.0000 meq | EXTENDED_RELEASE_TABLET | ORAL | Status: AC
  Administered 2020-05-09: 40 meq via ORAL
  Filled 2020-05-09: qty 2

## 2020-05-09 MED ORDER — HEPARIN SODIUM (PORCINE) 1000 UNIT/ML IJ SOLN
INTRAMUSCULAR | Status: DC | PRN
  Administered 2020-05-09: 4000 [IU] via INTRAVENOUS

## 2020-05-09 MED ORDER — HEPARIN (PORCINE) IN NACL 1000-0.9 UT/500ML-% IV SOLN
INTRAVENOUS | Status: AC
  Filled 2020-05-09: qty 1000

## 2020-05-09 MED ORDER — MAGNESIUM SULFATE 2 GM/50ML IV SOLN
2.0000 g | Freq: Once | INTRAVENOUS | Status: AC
  Administered 2020-05-09: 2 g via INTRAVENOUS
  Filled 2020-05-09: qty 50

## 2020-05-09 MED ORDER — HEPARIN (PORCINE) IN NACL 1000-0.9 UT/500ML-% IV SOLN
INTRAVENOUS | Status: DC | PRN
  Administered 2020-05-09: 500 mL

## 2020-05-09 MED ORDER — IOHEXOL 350 MG/ML SOLN
INTRAVENOUS | Status: DC | PRN
  Administered 2020-05-09: 55 mL

## 2020-05-09 MED ORDER — LIDOCAINE HCL (PF) 1 % IJ SOLN
INTRAMUSCULAR | Status: DC | PRN
  Administered 2020-05-09 (×3): 10 mL

## 2020-05-09 MED ORDER — POTASSIUM CHLORIDE CRYS ER 20 MEQ PO TBCR: 30.0000 meq | EXTENDED_RELEASE_TABLET | ORAL | Status: DC

## 2020-05-09 MED ORDER — ONDANSETRON HCL 4 MG/2ML IJ SOLN
INTRAMUSCULAR | Status: AC
  Filled 2020-05-09: qty 2

## 2020-05-09 MED ORDER — VERAPAMIL HCL 2.5 MG/ML IV SOLN
INTRAVENOUS | Status: DC | PRN
  Administered 2020-05-09: 10 mL via INTRA_ARTERIAL

## 2020-05-09 MED ORDER — FENTANYL CITRATE (PF) 100 MCG/2ML IJ SOLN
INTRAMUSCULAR | Status: AC
  Filled 2020-05-09: qty 2

## 2020-05-09 MED ORDER — MAGNESIUM HYDROXIDE 400 MG/5ML PO SUSP: 30.0000 mL | Freq: Every day | ORAL | Status: DC | PRN

## 2020-05-09 MED ORDER — ONDANSETRON HCL 4 MG/2ML IJ SOLN: 4.0000 mg | Freq: Four times a day (QID) | INTRAMUSCULAR | Status: DC | PRN

## 2020-05-09 MED ORDER — MIDAZOLAM HCL 2 MG/2ML IJ SOLN
INTRAMUSCULAR | Status: DC | PRN
  Administered 2020-05-09 (×2): 1 mg via INTRAVENOUS

## 2020-05-09 MED ORDER — SODIUM CHLORIDE 0.9% FLUSH
3.0000 mL | Freq: Two times a day (BID) | INTRAVENOUS | Status: DC
  Administered 2020-05-09 (×2): 3 mL via INTRAVENOUS

## 2020-05-09 SURGICAL SUPPLY — 19 items
CATH 5FR JL3.5 JR4 ANG PIG MP (CATHETERS) ×1 IMPLANT
CATH SWAN GANZ 7F STRAIGHT (CATHETERS) ×1 IMPLANT
DEVICE RAD COMP TR BAND LRG (VASCULAR PRODUCTS) ×1 IMPLANT
GLIDESHEATH SLEND SS 6F .021 (SHEATH) ×1 IMPLANT
GLIDESHEATH SLENDER 7FR .021G (SHEATH) ×1 IMPLANT
GUIDEWIRE .025 260CM (WIRE) ×1 IMPLANT
GUIDEWIRE INQWIRE 1.5J.035X260 (WIRE) IMPLANT
INQWIRE 1.5J .035X260CM (WIRE) ×2
KIT HEART LEFT (KITS) ×2 IMPLANT
PACK CARDIAC CATHETERIZATION (CUSTOM PROCEDURE TRAY) ×2 IMPLANT
SHEATH GLIDE SLENDER 4/5FR (SHEATH) ×1 IMPLANT
SHEATH PINNACLE 5F 10CM (SHEATH) IMPLANT
SHEATH PINNACLE 7F 10CM (SHEATH) ×1 IMPLANT
SHEATH PROBE COVER 6X72 (BAG) ×1 IMPLANT
TRANSDUCER W/STOPCOCK (MISCELLANEOUS) ×2 IMPLANT
TUBING CIL FLEX 10 FLL-RA (TUBING) ×2 IMPLANT
WIRE ASAHI PROWATER 180CM (WIRE) ×1 IMPLANT
WIRE EMERALD ST .035X150CM (WIRE) ×1 IMPLANT
WIRE HI TORQ VERSACORE-J 145CM (WIRE) ×1 IMPLANT

## 2020-05-09 NOTE — Progress Notes (Signed)
Progress Note  Patient Name: [name] Date of Encounter: 05/09/2020  Primary Cardiologist: Skeet Latch, MD  Subjective   Cardiac cath this am. No chest pain or dyspnea.   Inpatient Medications    Scheduled Meds: . [MAR Hold] aspirin EC  81 mg Oral Daily  . [MAR Hold] atorvastatin  40 mg Oral Daily  . [MAR Hold] citalopram  20 mg Oral Daily  . [MAR Hold] ezetimibe  10 mg Oral Daily  . [MAR Hold] furosemide  40 mg Intravenous BID  . [MAR Hold] gabapentin  300 mg Oral TID  . [MAR Hold] insulin aspart  0-15 Units Subcutaneous TID WC  . [MAR Hold] insulin aspart  0-5 Units Subcutaneous QHS  . [MAR Hold] insulin aspart  8 Units Subcutaneous TID WC  . [MAR Hold] insulin glargine  20 Units Subcutaneous BID  . [MAR Hold] lisinopril  20 mg Oral Daily  . [MAR Hold] metoprolol tartrate  12.5 mg Oral BID  . [MAR Hold] pantoprazole  40 mg Oral Daily  . [MAR Hold] potassium chloride  40 mEq Oral Q3H  . sodium chloride flush  3 mL Intravenous Q12H   Continuous Infusions: . sodium chloride    . sodium chloride 10 mL (05/08/20 2331)  . heparin 1,200 Units/hr (05/09/20 0302)  . magnesium sulfate bolus IVPB     PRN Meds: sodium chloride, [MAR Hold] acetaminophen, [MAR Hold] Gerhardt's butt cream, [MAR Hold] nitroGLYCERIN, [MAR Hold] ondansetron (ZOFRAN) IV, sodium chloride flush   Vital Signs    Vitals:   05/09/20 0955 05/09/20 1000 05/09/20 1005 05/09/20 1010  BP: (!) 109/51 (!) 99/58 (!) 108/55 (!) 95/45  Pulse: 68 66 68 67  Resp: 17 15 17 16   Temp:      TempSrc:      SpO2: 95% 97% 95% 92%  Weight:      Height:        Intake/Output Summary (Last 24 hours) at 05/09/2020 1023 Last data filed at 05/09/2020 0600 Gross per 24 hour  Intake 868.41 ml  Output 1675 ml  Net -806.59 ml   Filed Weights   05/05/20 1426 05/05/20 2056 05/08/20 0623  Weight: 88.9 kg 85.4 kg 82.6 kg    Telemetry    NSR- Personally reviewed.  ECG    No AM EKG  Physical Exam    General: Well developed, well nourished, NAD  HEENT: OP clear, mucus membranes moist  SKIN: warm, dry. No rashes. Neuro: No focal deficits  Musculoskeletal: Muscle strength 5/5 all ext  Psychiatric: Mood and affect normal  Neck: No JVD, no carotid bruits, no thyromegaly, no lymphadenopathy.  Lungs:Clear bilaterally, no wheezes, rhonci, crackles Cardiovascular: Regular rate and rhythm. Systolic murmur noted.  Abdomen:Soft. Bowel sounds present. Non-tender.  Extremities: No lower extremity edema. Pulses are 2 + in the bilateral DP/PT.    Labs    Chemistry Recent Labs  Lab 05/07/20 0218 05/08/20 0236 05/09/20 0638  NA 135 137 138  K 3.7 4.9 3.1*  CL 96* 96* 98  CO2 28 33* 30  GLUCOSE 257* 215* 135*  BUN 21 21 17   CREATININE 0.85 1.10* 0.75  CALCIUM 8.4* 8.6* 8.5*  GFRNONAA >60 50* >60  GFRAA >60 58* >60  ANIONGAP 11 8 10      Hematology Recent Labs  Lab 05/07/20 0218 05/08/20 0236 05/09/20 0254  WBC 9.0 10.4 10.0  RBC 5.02 5.17* 5.35*  HGB 13.6 14.2 14.6  HCT 42.0 43.5 44.8  MCV 83.7 84.1 83.7  MCH  27.1 27.5 27.3  MCHC 32.4 32.6 32.6  RDW 12.6 12.6 12.7  PLT 209 229 215    Cardiac Enzymes Recent Labs  Lab 05/05/20 1446 05/05/20 1718  TROPONINIHS 149* 128*    BNP Recent Labs  Lab 05/05/20 1449  BNP 215.2*     DDimer Recent Labs  Lab 05/05/20 1458  DDIMER 0.49     Radiology    CARDIAC CATHETERIZATION  Result Date: 05/09/2020  1st Mrg lesion is 25% stenosed.  2nd Mrg lesion is 25% stenosed.  The left ventricular systolic function is normal.  LV end diastolic pressure is normal.  The left ventricular ejection fraction is 55-65% by visual estimate.  There is severe aortic valve stenosis. Calculated aortic valve area 0.76 cm.  Aortic saturation 99%, PA saturation 68%, mean PA pressure 12 mmHg, mean PCWP 6 mmHg. Cardiac output 4.0 L/min. Cardiac index 2.16.  Nonobstructive coronary artery disease.  Severe aortic stenosis. We will forward  to Dr. Clifton James to decide on plans for TAVR. I was unable to reach her sister, Stann Mainland.  No voicemail option was available.    Patient Profile     74 y.o. female with a history of hypertension, type 2 diabetes mellitus and hyperlipidemia now presenting with NSTEMI.  Assessment & Plan    1.  CAD with elevated troponin: High-sensitivity troponin I peak at 149 then trending down to 128.  LVEF 55 to 60% without wall motion abnormalities by echocardiography. She has no chest pain. Cardiac cath this am with mild non-obstructive CAD. Will stop heparin. Continue ASA, statin and beta blocker.    2.  Severe Aortic stenosis: This is likely contributing to her presentation. Cath data with mean gradient of 33 mmHg and AVA 0.76 cm2. Will begin TAVR workup. We will let her go home today and plan her pre TAVR CT scans as an outpatient. She will be contacted by the valve team to arrange this.   3.  Essential hypertension: BP controlled on current therapy.  3.  Type 2 diabetes mellitus, sliding scale.  4.  Mixed hyperlipidemia: Continue statin and Zetia.   5.  Acute on chronic diastolic CHF: She has diuresed well. Wedge pressure today. Will continue Lasix 40 mg daily at home.     Discharge home today after post cath bedrest.   Signed, Verne Carrow, MD  05/09/2020, 10:23 AM

## 2020-05-09 NOTE — Progress Notes (Signed)
Site area- right  Site Prior to Removal- 0   Pressure Applied For-  20 MInutes   Bedrest Beginning at - 1015   Manual- Yes   Patient Status During Pull- Stable    Post Pull Groin Site- 0   Post Pull Instructions Given- Yes   Post Pull Pulses Present- Yes    Dressing Applied- Tegaderm and Gauze Dressing    Comments:  site cdi

## 2020-05-09 NOTE — Plan of Care (Signed)

## 2020-05-09 NOTE — Discharge Summary (Addendum)
Discharge Summary    Patient ID: [name],  MRN: [id number], DOB/AGE: [birth year] 74 y.o.  Admit date: 05/05/2020 Discharge date: 05/10/2020  Primary Care Provider: Hillard [name] A Primary Cardiologist: Dr. Anakaren Campion [name]  Discharge Diagnoses    Principal Problem:   Non-ST elevation myocardial infarction (NSTEMI) [facility] Active Problems:   Type II diabetes mellitus with neurological manifestations, uncontrolled (HCC)   Hyperlipidemia associated with type 2 diabetes mellitus (HCC)   Essential hypertension   Venous (peripheral) insufficiency   NSTEMI (non-ST elevated myocardial infarction) (HCC)   Nonrheumatic aortic valve stenosis   Allergies Allergies  Allergen Reactions  . Morphine Other (See Comments)    'took me out of this world' I had to be resuscitated  . Codeine Sulfate Other (See Comments)    GI upset and pain  . Demerol [Meperidine] Nausea And Vomiting  . Meperidine Hcl Nausea And Vomiting and Swelling  . Propoxyphene Hcl Other (See Comments)    Darvocet caused sick headache    Diagnostic Studies/Procedures   Echo: 05/06/20  IMPRESSIONS    1. Left ventricular ejection fraction, by estimation, is 55 to 60%. The  left ventricle has normal function. The left ventricle has no regional  wall motion abnormalities. There is mild concentric left ventricular  hypertrophy. Left ventricular diastolic  function could not be evaluated.   2. Right ventricular systolic function is normal. The right ventricular  size is normal. Tricuspid regurgitation signal is inadequate for assessing  PA pressure.   3. The mitral valve is grossly normal. No evidence of mitral valve  regurgitation. No evidence of mitral stenosis.   4. Unable to determine valve morphology due to image quality. Aortic  valve regurgitation is not visualized. Moderate aortic valve stenosis.  Aortic valve area, by VTI measures 0.74 cm. Aortic valve mean gradient  measures 32.0 mmHg. Aortic  valve Vmax  measures 3.47 m/s.   5. The inferior vena cava is normal in size with greater than 50%  respiratory variability, suggesting right atrial pressure of 3 mmHg.    Cath: 05/09/20   1st Mrg lesion is 25% stenosed.  2nd Mrg lesion is 25% stenosed.  The left ventricular systolic function is normal.  LV end diastolic pressure is normal.  The left ventricular ejection fraction is 55-65% by visual estimate.  There is severe aortic valve stenosis. Calculated aortic valve area 0.76 cm.  Aortic saturation 99%, PA saturation 68%, mean PA pressure 12 mmHg, mean PCWP 6 mmHg. Cardiac output 4.0 L/min. Cardiac index 2.16.   Nonobstructive coronary artery disease.  Severe aortic stenosis.   We will forward to [name] to decide on plans for TAVR. _____________   History of Present Illness     [name] is a 74 year old female with diabetes with hypertension hyperlipidemia back pain lower extremity edema who presented to the ED with chest discomfort that started a few days prior waxing and waning.    This chest discomfort began about 5 days prior and felt more shortness of breath at the time had some difficulty catching her breath.  The day of admission it was constant and seemed to be worse with activity.  Sometimes the pain radiated up to her jaw.  She took an aspirin 81 mg.  Felt as though an elephant was sitting on her chest.  Concerned her.  Moderate to severe in severity.  Relieved with rest at one point.  Did feel some mild shortness of breath.    Back in 2016 she was  evaluated by Dr. Ankith Edmonston [name] for chest discomfort after having some exertional chest discomfort.  She reported that it was mostly back pain that radiated up to her head and that was very atypical.  Eugenie BirksLexiscan was performed and was low risk without any evidence of ischemia in 2016.  She was lost to follow-up until recently in 03/27/2020 when she was seen [place] [name], [place].   At that time in April she continued to have  back and neck pain but thought this was because she reinjured her back back in January.  She was not having any shortness of breath at that time.  Her lower extremity edema however was getting worse, some dietary indiscretions were noted.  She only has 7 teeth so is hard for her to eat frozen foods.  She did not have any recent orthopnea.  Over the past year she had about 8 deaths in the family and noted some skipping of heartbeats during the funeral and stressful events.  Some dizziness with quick positional changes but no syncope.   Her diabetes has been difficult to control with hemoglobin A1c of 11.2.  She also has neuropathy. Trop was noted to be mildly elevated at 149. She was started on IV heparin and admitted over the weekend with plans for cardiac cath.   Hospital Course     Consultants: None  1.  CAD with elevated troponin: High-sensitivity troponin I peak at 149 then trending down to 128.  LVEF 55 to 60% without wall motion abnormalities by echocardiography. She had no further chest pain during admission. Cardiac cath noted above with mild non-obstructive CAD. Continue medical therapy with ASA, statin and beta blocker.     2.  Severe Aortic stenosis: This was felt to likely be contributing to her presentation. Cath data with mean gradient of 33 mmHg and AVA 0.76 cm2. TAVR team is aware and will begin her workup with CT scans as an outpatient.  -- to be arranged via TAVR team   3.  Essential hypertension: BP controlled on current therapy. -- continue ACEi with added BB therapy this admission.   4.  Type 2 diabetes mellitus: treated with sliding scale while inpatient. -- resumed on her home medications at discharge.   5.  Mixed hyperlipidemia: Continue statin and Zetia.    6.  Acute on chronic diastolic CHF: She diuresed well. Net - 3.2L at discharge. Wedge pressure 6mmHg noted on cath. Plan continue Lasix 40 mg daily at home.     Note: Discharge held yesterday for mild drop in O2  saturations after walking. Has walked today with O2 sats in the low 90s on room air. Discharge home today.   _____________  Discharge Vitals Blood pressure 102/64, pulse 77, temperature 97.6 F (36.4 C), temperature source Oral, resp. rate 17, height 5\' 4"  (1.626 m), weight 82.6 kg, SpO2 92 %.  Filed Weights   05/05/20 1426 05/05/20 2056 05/08/20 0623  Weight: 88.9 kg 85.4 kg 82.6 kg    Labs & Radiologic Studies    CBC Recent Labs    05/09/20 0254 05/09/20 0907 05/09/20 0925 05/10/20 0154  WBC 10.0  --   --  9.2  HGB 14.6   < > 13.9 14.7  HCT 44.8   < > 41.0 45.5  MCV 83.7  --   --  83.8  PLT 215  --   --  218   < > = values in this interval not displayed.   Basic Metabolic Panel Recent Labs  05/08/20 0236 05/08/20 0236 05/09/20 0454 05/09/20 0981 05/09/20 0735 05/09/20 0907 05/09/20 0925  NA 137   < > 138   < >  --  139 138  K 4.9   < > 3.1*   < >  --  3.5 3.4*  CL 96*  --  98  --   --   --   --   CO2 33*  --  30  --   --   --   --   GLUCOSE 215*  --  135*  --   --   --   --   BUN 21  --  17  --   --   --   --   CREATININE 1.10*  --  0.75  --   --   --   --   CALCIUM 8.6*  --  8.5*  --   --   --   --   MG  --   --   --   --  1.7  --   --    < > = values in this interval not displayed.   Liver Function Tests No results for input(s): AST, ALT, ALKPHOS, BILITOT, PROT, ALBUMIN in the last 72 hours. No results for input(s): LIPASE, AMYLASE in the last 72 hours. Cardiac Enzymes No results for input(s): CKTOTAL, CKMB, CKMBINDEX, TROPONINI in the last 72 hours. BNP Invalid input(s): POCBNP D-Dimer No results for input(s): DDIMER in the last 72 hours. Hemoglobin A1C No results for input(s): HGBA1C in the last 72 hours. Fasting Lipid Panel No results for input(s): CHOL, HDL, LDLCALC, TRIG, CHOLHDL, LDLDIRECT in the last 72 hours. Thyroid Function Tests No results for input(s): TSH, T4TOTAL, T3FREE, THYROIDAB in the last 72 hours.  Invalid input(s):  FREET3 _____________  DG Chest 2 View  Result Date: 05/05/2020 CLINICAL DATA:  Chest tightness, short of breath EXAM: CHEST - 2 VIEW COMPARISON:  12/19/2014 FINDINGS: The heart size and mediastinal contours are within normal limits. Both lungs are clear. The visualized skeletal structures are unremarkable. IMPRESSION: No active cardiopulmonary disease. Electronically Signed   By: Jasmine Pang M.D.   On: 05/05/2020 15:26   CARDIAC CATHETERIZATION  Result Date: 05/09/2020  1st Mrg lesion is 25% stenosed.  2nd Mrg lesion is 25% stenosed.  The left ventricular systolic function is normal.  LV end diastolic pressure is normal.  The left ventricular ejection fraction is 55-65% by visual estimate.  There is severe aortic valve stenosis. Calculated aortic valve area 0.76 cm.  Aortic saturation 99%, PA saturation 68%, mean PA pressure 12 mmHg, mean PCWP 6 mmHg. Cardiac output 4.0 L/min. Cardiac index 2.16.  Nonobstructive coronary artery disease.  Severe aortic stenosis. We will forward to [name] to decide on plans for TAVR. I was unable to reach her sister, Stann Mainland.  No voicemail option was available.   ECHOCARDIOGRAM COMPLETE  Result Date: 05/06/2020    ECHOCARDIOGRAM REPORT   Patient Name:   [name] Date of Exam: 05/06/2020 Medical Rec #:  [id number]             Height:       64.0 in Accession #:    [id number]            Weight:       188.3 lb Date of Birth:  September 01, 1946             BSA:          1.907 m Patient  Age:    70 years              BP:           97/50 mmHg Patient Gender: F                     HR:           80 bpm. Exam Location:  Inpatient Procedure: 2D Echo, Color Doppler, Cardiac Doppler and Intracardiac            Opacification Agent Indications:    NSTEMI  History:        Patient has prior history of Echocardiogram examinations, most                 recent 10/08/2012. NSTEMI; Risk Factors:Hypertension, Diabetes                 and Dyslipidemia.  Sonographer:     Raquel Sarna Senior RDCS Referring Phys: [id number] Darreld Mclean  Sonographer Comments: Technically difficult due to poor echo windows and reduced skin integrity. IMPRESSIONS  1. Left ventricular ejection fraction, by estimation, is 55 to 60%. The left ventricle has normal function. The left ventricle has no regional wall motion abnormalities. There is mild concentric left ventricular hypertrophy. Left ventricular diastolic function could not be evaluated.  2. Right ventricular systolic function is normal. The right ventricular size is normal. Tricuspid regurgitation signal is inadequate for assessing PA pressure.  3. The mitral valve is grossly normal. No evidence of mitral valve regurgitation. No evidence of mitral stenosis.  4. Unable to determine valve morphology due to image quality. Aortic valve regurgitation is not visualized. Moderate aortic valve stenosis. Aortic valve area, by VTI measures 0.74 cm. Aortic valve mean gradient measures 32.0 mmHg. Aortic valve Vmax measures 3.47 m/s.  5. The inferior vena cava is normal in size with greater than 50% respiratory variability, suggesting right atrial pressure of 3 mmHg. Comparison(s): Changes from prior study are noted. Aortic stenosis is now moderate. LVEF is normal. FINDINGS  Left Ventricle: Left ventricular ejection fraction, by estimation, is 55 to 60%. The left ventricle has normal function. The left ventricle has no regional wall motion abnormalities. Definity contrast agent was given IV to delineate the left ventricular  endocardial borders. The left ventricular internal cavity size was normal in size. There is mild concentric left ventricular hypertrophy. Left ventricular diastolic function could not be evaluated due to nondiagnostic images. Left ventricular diastolic function could not be evaluated. Right Ventricle: The right ventricular size is normal. No increase in right ventricular wall thickness. Right ventricular systolic function is normal. Tricuspid  regurgitation signal is inadequate for assessing PA pressure. Left Atrium: Left atrial size was normal in size. Right Atrium: Right atrial size was normal in size. Pericardium: Trivial pericardial effusion is present. Presence of pericardial fat pad. Mitral Valve: The mitral valve is grossly normal. Mild mitral annular calcification. No evidence of mitral valve regurgitation. No evidence of mitral valve stenosis. Tricuspid Valve: The tricuspid valve is grossly normal. Tricuspid valve regurgitation is not demonstrated. No evidence of tricuspid stenosis. Aortic Valve: Unable to determine valve morphology due to image quality.. There is moderate thickening and moderate calcification of the aortic valve. Aortic valve regurgitation is not visualized. Moderate aortic stenosis is present. There is moderate thickening of the aortic valve. There is moderate calcification of the aortic valve. Aortic valve mean gradient measures 32.0 mmHg. Aortic valve peak gradient measures 48.2 mmHg. Aortic valve area, by  VTI measures 0.74 cm. Pulmonic Valve: The pulmonic valve was grossly normal. Pulmonic valve regurgitation is not visualized. No evidence of pulmonic stenosis. Aorta: The aortic root and ascending aorta are structurally normal, with no evidence of dilitation. Venous: The inferior vena cava is normal in size with greater than 50% respiratory variability, suggesting right atrial pressure of 3 mmHg. IAS/Shunts: The atrial septum is grossly normal.  LEFT VENTRICLE PLAX 2D LVIDd:         3.80 cm  Diastology LVIDs:         2.30 cm  LV e' lateral:   8.38 cm/s LV PW:         1.00 cm  LV E/e' lateral: 10.6 LV IVS:        1.20 cm  LV e' medial:    4.79 cm/s LVOT diam:     1.90 cm  LV E/e' medial:  18.6 LV SV:         51 LV SV Index:   27 LVOT Area:     2.84 cm  RIGHT VENTRICLE RV S prime:     9.57 cm/s TAPSE (M-mode): 2.0 cm LEFT ATRIUM             Index       RIGHT ATRIUM           Index LA diam:        3.60 cm 1.89 cm/m  RA Area:      11.00 cm LA Vol (A2C):   62.9 ml 32.99 ml/m RA Volume:   24.50 ml  12.85 ml/m LA Vol (A4C):   52.5 ml 27.53 ml/m LA Biplane Vol: 58.8 ml 30.84 ml/m  AORTIC VALVE AV Area (Vmax):    0.62 cm AV Area (Vmean):   0.59 cm AV Area (VTI):     0.74 cm AV Vmax:           347.00 cm/s AV Vmean:          264.000 cm/s AV VTI:            0.690 m AV Peak Grad:      48.2 mmHg AV Mean Grad:      32.0 mmHg LVOT Vmax:         75.40 cm/s LVOT Vmean:        55.300 cm/s LVOT VTI:          0.179 m LVOT/AV VTI ratio: 0.26  AORTA Ao Root diam: 2.60 cm Ao Asc diam:  3.00 cm MITRAL VALVE MV Area (PHT): 3.19 cm    SHUNTS MV Decel Time: 238 msec    Systemic VTI:  0.18 m MV E velocity: 89.10 cm/s  Systemic Diam: 1.90 cm MV A velocity: 90.40 cm/s MV E/A ratio:  0.99 Lennie Odor MD Electronically signed by Lennie Odor MD Signature Date/Time: 05/06/2020/3:10:41 PM    Final    Disposition   Pt is being discharged home today in good condition.  Follow-up Plans & Appointments    Follow-up Information    TAVR Work up Follow up.   Why: Please keep all scheduled appts regarding your valve work up.          Discharge Instructions    Amb Referral to Cardiac Rehabilitation   Complete by: As directed    Diagnosis: NSTEMI   After initial evaluation and assessments completed: Virtual Based Care may be provided alone or in conjunction with Phase 2 Cardiac Rehab based on patient barriers.: Yes   Ambulatory referral to Nutrition  and Diabetic Education   Complete by: As directed    Diet - low sodium heart healthy   Complete by: As directed    Diet - low sodium heart healthy   Complete by: As directed    Discharge instructions   Complete by: As directed    Groin Site Care Refer to this sheet in the next few weeks. These instructions provide you with information on caring for yourself after your procedure. Your caregiver may also give you more specific instructions. Your treatment has been planned according to current  medical practices, but problems sometimes occur. Call your caregiver if you have any problems or questions after your procedure. HOME CARE INSTRUCTIONS You may shower 24 hours after the procedure. Remove the bandage (dressing) and gently wash the site with plain soap and water. Gently pat the site dry.  Do not apply powder or lotion to the site.  Do not sit in a bathtub, swimming pool, or whirlpool for 5 to 7 days.  No bending, squatting, or lifting anything over 10 pounds (4.5 kg) as directed by your caregiver.  Inspect the site at least twice daily.  Do not drive home if you are discharged the same day of the procedure. Have someone else drive you.  You may drive 24 hours after the procedure unless otherwise instructed by your caregiver.  What to expect: Any bruising will usually fade within 1 to 2 weeks.  Blood that collects in the tissue (hematoma) may be painful to the touch. It should usually decrease in size and tenderness within 1 to 2 weeks.  SEEK IMMEDIATE MEDICAL CARE IF: You have unusual pain at the groin site or down the affected leg.  You have redness, warmth, swelling, or pain at the groin site.  You have drainage (other than a small amount of blood on the dressing).  You have chills.  You have a fever or persistent symptoms for more than 72 hours.  You have a fever and your symptoms suddenly get worse.  Your leg becomes pale, cool, tingly, or numb.  You have heavy bleeding from the site. Hold pressure on the site. .  Radial Site Care Refer to this sheet in the next few weeks. These instructions provide you with information on caring for yourself after your procedure. Your caregiver may also give you more specific instructions. Your treatment has been planned according to current medical practices, but problems sometimes occur. Call your caregiver if you have any problems or questions after your procedure. HOME CARE INSTRUCTIONS You may shower the day after the  procedure.Remove the bandage (dressing) and gently wash the site with plain soap and water.Gently pat the site dry.  Do not apply powder or lotion to the site.  Do not submerge the affected site in water for 3 to 5 days.  Inspect the site at least twice daily.  Do not flex or bend the affected arm for 24 hours.  No lifting over 5 pounds (2.3 kg) for 5 days after your procedure.  Do not drive home if you are discharged the same day of the procedure. Have someone else drive you.  You may drive 24 hours after the procedure unless otherwise instructed by your caregiver.  What to expect: Any bruising will usually fade within 1 to 2 weeks.  Blood that collects in the tissue (hematoma) may be painful to the touch. It should usually decrease in size and tenderness within 1 to 2 weeks.  SEEK IMMEDIATE MEDICAL CARE IF: You  have unusual pain at the radial site.  You have redness, warmth, swelling, or pain at the radial site.  You have drainage (other than a small amount of blood on the dressing).  You have chills.  You have a fever or persistent symptoms for more than 72 hours.  You have a fever and your symptoms suddenly get worse.  Your arm becomes pale, cool, tingly, or numb.  You have heavy bleeding from the site. Hold pressure on the site.   Please keep all scheduled appts for ongoing work up regarding your heart valve.   Discharge instructions   Complete by: As directed    No driving for 2 days. No lifting over 5 lbs for 1 week. No sexual activity for 1 week. Keep procedure site clean & dry. If you notice increased pain, swelling, bleeding or pus, call/return!  You may shower, but no soaking baths/hot tubs/pools for 1 week.   Increase activity slowly   Complete by: As directed    Increase activity slowly   Complete by: As directed       Discharge Medications   Allergies as of 05/10/2020      Reactions   Morphine Other (See Comments)   'took me out of this world' I had to be  resuscitated   Codeine Sulfate Other (See Comments)   GI upset and pain   Demerol [meperidine] Nausea And Vomiting   Meperidine Hcl Nausea And Vomiting, Swelling   Propoxyphene Hcl Other (See Comments)   Darvocet caused sick headache      Medication List    TAKE these medications   ASPERCREME EX Apply 1 application topically daily as needed (shoulder/hips/knee/knuckles).   aspirin 81 MG tablet Take 81 mg by mouth daily.   atorvastatin 40 MG tablet Commonly known as: LIPITOR Take 1 tablet (40 mg total) by mouth daily.   citalopram 20 MG tablet Commonly known as: CELEXA Take 1 tablet (20 mg total) by mouth daily.   esomeprazole 20 MG capsule Commonly known as: NexIUM Take 1 capsule (20 mg total) by mouth daily at 12 noon.   ezetimibe 10 MG tablet Commonly known as: ZETIA Take 1 tablet (10 mg total) by mouth daily.   FreeStyle [employer] Misc Use to monitor sugars   furosemide 40 MG tablet Commonly known as: LASIX Take 1 tablet (40 mg total) by mouth daily.   gabapentin 300 MG capsule Commonly known as: NEURONTIN Take 1 capsule (300 mg total) by mouth 3 (three) times daily.   Insulin Lispro Prot & Lispro (75-25) 100 UNIT/ML Kwikpen Commonly known as: HumaLOG Mix 75/25 KwikPen Inject 44 Units into the skin 2 (two) times daily.   Insulin Pen Needle 31G X 8 MM Misc Use to inject insulin four times daily E11.41   lisinopril 20 MG tablet Commonly known as: ZESTRIL Take 1 tablet (20 mg total) by mouth daily.   metFORMIN 1000 MG tablet Commonly known as: GLUCOPHAGE Take 0.5 tablets (500 mg total) by mouth 2 (two) times daily with a meal.   metoprolol tartrate 25 MG tablet Commonly known as: LOPRESSOR Take 0.5 tablets (12.5 mg total) by mouth 2 (two) times daily.   OneTouch Verio test strip Generic drug: glucose blood Use as instructed   sodium chloride 0.65 % Soln nasal spray Commonly known as: OCEAN Place 1 spray into both nostrils 4 (four) times  daily. What changed:   when to take this  reasons to take this  Outstanding Labs/Studies   Continued TAVR work up  Duration of Discharge Encounter   Greater than 30 minutes including physician time.  Signed, Laverda Page NP-C 05/10/2020, 9:15 AM   I have personally seen and examined this patient. I agree with the assessment and plan as outlined above.  See my full note from today with details.   Roe Rutherford Humna [place] 05/10/2020 9:15 AM

## 2020-05-09 NOTE — Interval H&P Note (Signed)
Cath Lab Visit (complete for each Cath Lab visit)  Clinical Evaluation Leading to the Procedure:   ACS: Yes.    Non-ACS:    Anginal Classification: CCS IV  Anti-ischemic medical therapy: Minimal Therapy (1 class of medications)  Non-Invasive Test Results: No non-invasive testing performed  Prior CABG: No previous CABG      History and Physical Interval Note:  05/09/2020 8:19 AM  [name]  has presented today for surgery, with the diagnosis of NSTEMI.  The various methods of treatment have been discussed with the patient and family. After consideration of risks, benefits and other options for treatment, the patient has consented to  Procedure(s): RIGHT/LEFT HEART CATH AND CORONARY ANGIOGRAPHY (N/A) as a surgical intervention.  The patient's history has been reviewed, patient examined, no change in status, stable for surgery.  I have reviewed the patient's chart and labs.  Questions were answered to the patient's satisfaction.     Lance Muss

## 2020-05-09 NOTE — Progress Notes (Signed)
ANTICOAGULATION CONSULT NOTE  Pharmacy Consult for heparin Indication: chest pain/ACS  Allergies  Allergen Reactions  . Morphine Other (See Comments)    'took me out of this world' I had to be resuscitated  . Codeine Sulfate Other (See Comments)    GI upset and pain  . Demerol [Meperidine] Nausea And Vomiting  . Meperidine Hcl Nausea And Vomiting and Swelling  . Propoxyphene Hcl Other (See Comments)    Darvocet caused sick headache    Patient Measurements: Height: 5\' 4"  (162.6 cm) Weight: 82.6 kg (182 lb 1.6 oz) IBW/kg (Calculated) : 54.7 Heparin Dosing Weight: 74.5kg  Vital Signs: Temp: 98.2 F (36.8 C) (06/01 0449) Temp Source: Oral (06/01 0449) BP: 109/66 (06/01 0449) Pulse Rate: 68 (06/01 0449)  Labs: Recent Labs    05/07/20 0218 05/07/20 0218 05/08/20 0236 05/08/20 0238 05/09/20 0254 05/09/20 0638  HGB 13.6   < > 14.2  --  14.6  --   HCT 42.0  --  43.5  --  44.8  --   PLT 209  --  229  --  215  --   HEPARINUNFRC 0.32  --   --  0.53 0.48  --   CREATININE 0.85  --  1.10*  --   --  0.75   < > = values in this interval not displayed.    Estimated Creatinine Clearance: 65.2 mL/min (by C-G formula based on SCr of 0.75 mg/dL).   Medical History: Past Medical History:  Diagnosis Date  . Achilles tendinitis   . Acute renal failure (ARF) (HCC) 12/19/2014  . Calcaneal spur    right  . Cataract   . Depression   . Diabetes mellitus    type II  . Diverticulosis of colon (without mention of hemorrhage) 2013  . GERD (gastroesophageal reflux disease)   . GI bleed 03/26/14  . Hyperlipidemia   . Hypertension   . Internal hemorrhoids 2013  . Peripheral neuropathy   . Right shoulder pain    Subacromial tendinitis  . Venous insufficiency   . Ventral hernia     Medications:  Infusions:  . sodium chloride    . sodium chloride 10 mL (05/08/20 2331)  . heparin 1,200 Units/hr (05/09/20 0302)    Assessment: 30 yof presented to the ED with CP. Troponin mildly  elevated. To start IV heparin. Baseline CBC is WNL. She is not on anticoagulation PTA.   Heparin level remains therapeutic at 0.48, on 1200 units/hr. Hgb 14.6, plt 215. No s/sx of bleeding or infusion issues.    Goal of Therapy:  Heparin level 0.3-0.7 units/ml Monitor platelets by anticoagulation protocol: Yes   Plan:  -Continue heparin 1200 units/h -Daily heparin level and CBC -F/u timing of cardiac cath  66, PharmD, BCCCP Clinical Pharmacist  Phone: [phone] 05/09/2020 7:21 AM  Please check AMION for all [facility] Pharmacy phone numbers After 10:00 PM, call Main Pharmacy [phone]

## 2020-05-10 ENCOUNTER — Emergency Department (HOSPITAL_COMMUNITY): Payer: Medicare Other

## 2020-05-10 ENCOUNTER — Encounter (HOSPITAL_COMMUNITY): Payer: Self-pay | Admitting: Emergency Medicine

## 2020-05-10 ENCOUNTER — Inpatient Hospital Stay (HOSPITAL_COMMUNITY)
Admission: EM | Admit: 2020-05-10 | Discharge: 2020-05-19 | DRG: 266 | Disposition: A | Discharge status: Home Health | Payer: Medicare Other | Attending: Family Medicine | Admitting: Family Medicine

## 2020-05-10 ENCOUNTER — Other Ambulatory Visit: Payer: Self-pay

## 2020-05-10 DIAGNOSIS — S0990XA Unspecified injury of head, initial encounter: Secondary | ICD-10-CM | POA: Diagnosis not present

## 2020-05-10 DIAGNOSIS — I2510 Atherosclerotic heart disease of native coronary artery without angina pectoris: Secondary | ICD-10-CM | POA: Diagnosis not present

## 2020-05-10 DIAGNOSIS — E1165 Type 2 diabetes mellitus with hyperglycemia: Secondary | ICD-10-CM | POA: Diagnosis present

## 2020-05-10 DIAGNOSIS — Z7982 Long term (current) use of aspirin: Secondary | ICD-10-CM

## 2020-05-10 DIAGNOSIS — K219 Gastro-esophageal reflux disease without esophagitis: Secondary | ICD-10-CM | POA: Diagnosis not present

## 2020-05-10 DIAGNOSIS — E1169 Type 2 diabetes mellitus with other specified complication: Secondary | ICD-10-CM | POA: Diagnosis not present

## 2020-05-10 DIAGNOSIS — E869 Volume depletion, unspecified: Secondary | ICD-10-CM | POA: Diagnosis present

## 2020-05-10 DIAGNOSIS — S0083XA Contusion of other part of head, initial encounter: Secondary | ICD-10-CM | POA: Diagnosis present

## 2020-05-10 DIAGNOSIS — I10 Essential (primary) hypertension: Secondary | ICD-10-CM | POA: Diagnosis present

## 2020-05-10 DIAGNOSIS — S199XXA Unspecified injury of neck, initial encounter: Secondary | ICD-10-CM | POA: Diagnosis not present

## 2020-05-10 DIAGNOSIS — N390 Urinary tract infection, site not specified: Secondary | ICD-10-CM | POA: Diagnosis present

## 2020-05-10 DIAGNOSIS — R079 Chest pain, unspecified: Secondary | ICD-10-CM | POA: Diagnosis not present

## 2020-05-10 DIAGNOSIS — Z885 Allergy status to narcotic agent status: Secondary | ICD-10-CM

## 2020-05-10 DIAGNOSIS — L8990 Pressure ulcer of unspecified site, unspecified stage: Secondary | ICD-10-CM | POA: Diagnosis present

## 2020-05-10 DIAGNOSIS — G8929 Other chronic pain: Secondary | ICD-10-CM | POA: Diagnosis not present

## 2020-05-10 DIAGNOSIS — I214 Non-ST elevation (NSTEMI) myocardial infarction: Secondary | ICD-10-CM | POA: Diagnosis not present

## 2020-05-10 DIAGNOSIS — E11649 Type 2 diabetes mellitus with hypoglycemia without coma: Secondary | ICD-10-CM | POA: Diagnosis not present

## 2020-05-10 DIAGNOSIS — K0530 Chronic periodontitis, unspecified: Secondary | ICD-10-CM | POA: Diagnosis present

## 2020-05-10 DIAGNOSIS — I471 Supraventricular tachycardia: Secondary | ICD-10-CM | POA: Diagnosis not present

## 2020-05-10 DIAGNOSIS — Z79899 Other long term (current) drug therapy: Secondary | ICD-10-CM

## 2020-05-10 DIAGNOSIS — E1149 Type 2 diabetes mellitus with other diabetic neurological complication: Secondary | ICD-10-CM | POA: Diagnosis present

## 2020-05-10 DIAGNOSIS — J9601 Acute respiratory failure with hypoxia: Secondary | ICD-10-CM | POA: Diagnosis not present

## 2020-05-10 DIAGNOSIS — I951 Orthostatic hypotension: Secondary | ICD-10-CM | POA: Diagnosis not present

## 2020-05-10 DIAGNOSIS — Z952 Presence of prosthetic heart valve: Secondary | ICD-10-CM

## 2020-05-10 DIAGNOSIS — I872 Venous insufficiency (chronic) (peripheral): Secondary | ICD-10-CM | POA: Diagnosis present

## 2020-05-10 DIAGNOSIS — Z560 Unemployment, unspecified: Secondary | ICD-10-CM

## 2020-05-10 DIAGNOSIS — Z90722 Acquired absence of ovaries, bilateral: Secondary | ICD-10-CM

## 2020-05-10 DIAGNOSIS — Z888 Allergy status to other drugs, medicaments and biological substances status: Secondary | ICD-10-CM

## 2020-05-10 DIAGNOSIS — K029 Dental caries, unspecified: Secondary | ICD-10-CM | POA: Diagnosis not present

## 2020-05-10 DIAGNOSIS — Z8041 Family history of malignant neoplasm of ovary: Secondary | ICD-10-CM

## 2020-05-10 DIAGNOSIS — Z01818 Encounter for other preprocedural examination: Secondary | ICD-10-CM

## 2020-05-10 DIAGNOSIS — E669 Obesity, unspecified: Secondary | ICD-10-CM | POA: Diagnosis present

## 2020-05-10 DIAGNOSIS — Z833 Family history of diabetes mellitus: Secondary | ICD-10-CM

## 2020-05-10 DIAGNOSIS — I700 Atherosclerosis of aorta: Secondary | ICD-10-CM | POA: Diagnosis not present

## 2020-05-10 DIAGNOSIS — M25462 Effusion, left knee: Secondary | ICD-10-CM | POA: Diagnosis not present

## 2020-05-10 DIAGNOSIS — N179 Acute kidney failure, unspecified: Secondary | ICD-10-CM | POA: Diagnosis present

## 2020-05-10 DIAGNOSIS — Z006 Encounter for examination for normal comparison and control in clinical research program: Secondary | ICD-10-CM | POA: Diagnosis not present

## 2020-05-10 DIAGNOSIS — K589 Irritable bowel syndrome without diarrhea: Secondary | ICD-10-CM | POA: Diagnosis present

## 2020-05-10 DIAGNOSIS — L89152 Pressure ulcer of sacral region, stage 2: Secondary | ICD-10-CM | POA: Diagnosis present

## 2020-05-10 DIAGNOSIS — E785 Hyperlipidemia, unspecified: Secondary | ICD-10-CM | POA: Diagnosis not present

## 2020-05-10 DIAGNOSIS — R55 Syncope and collapse: Secondary | ICD-10-CM | POA: Diagnosis not present

## 2020-05-10 DIAGNOSIS — Z794 Long term (current) use of insulin: Secondary | ICD-10-CM

## 2020-05-10 DIAGNOSIS — E1142 Type 2 diabetes mellitus with diabetic polyneuropathy: Secondary | ICD-10-CM | POA: Diagnosis not present

## 2020-05-10 DIAGNOSIS — R7401 Elevation of levels of liver transaminase levels: Secondary | ICD-10-CM | POA: Diagnosis present

## 2020-05-10 DIAGNOSIS — S8992XA Unspecified injury of left lower leg, initial encounter: Secondary | ICD-10-CM | POA: Diagnosis not present

## 2020-05-10 DIAGNOSIS — E119 Type 2 diabetes mellitus without complications: Secondary | ICD-10-CM | POA: Diagnosis present

## 2020-05-10 DIAGNOSIS — I350 Nonrheumatic aortic (valve) stenosis: Secondary | ICD-10-CM | POA: Diagnosis not present

## 2020-05-10 DIAGNOSIS — F329 Major depressive disorder, single episode, unspecified: Secondary | ICD-10-CM | POA: Diagnosis present

## 2020-05-10 DIAGNOSIS — Z20822 Contact with and (suspected) exposure to covid-19: Secondary | ICD-10-CM | POA: Diagnosis present

## 2020-05-10 DIAGNOSIS — J302 Other seasonal allergic rhinitis: Secondary | ICD-10-CM | POA: Diagnosis present

## 2020-05-10 DIAGNOSIS — Z9079 Acquired absence of other genital organ(s): Secondary | ICD-10-CM

## 2020-05-10 DIAGNOSIS — R0602 Shortness of breath: Secondary | ICD-10-CM | POA: Diagnosis not present

## 2020-05-10 DIAGNOSIS — Z9049 Acquired absence of other specified parts of digestive tract: Secondary | ICD-10-CM

## 2020-05-10 DIAGNOSIS — Z8249 Family history of ischemic heart disease and other diseases of the circulatory system: Secondary | ICD-10-CM

## 2020-05-10 DIAGNOSIS — K089 Disorder of teeth and supporting structures, unspecified: Secondary | ICD-10-CM

## 2020-05-10 DIAGNOSIS — Z90710 Acquired absence of both cervix and uterus: Secondary | ICD-10-CM

## 2020-05-10 DIAGNOSIS — W1830XA Fall on same level, unspecified, initial encounter: Secondary | ICD-10-CM | POA: Diagnosis present

## 2020-05-10 DIAGNOSIS — S79912A Unspecified injury of left hip, initial encounter: Secondary | ICD-10-CM | POA: Diagnosis not present

## 2020-05-10 DIAGNOSIS — Z6831 Body mass index (BMI) 31.0-31.9, adult: Secondary | ICD-10-CM

## 2020-05-10 DIAGNOSIS — E118 Type 2 diabetes mellitus with unspecified complications: Secondary | ICD-10-CM | POA: Diagnosis present

## 2020-05-10 LAB — CBC
HCT: 45.5 % (ref 36.0–46.0)
Hemoglobin: 14.7 g/dL (ref 12.0–15.0)
MCH: 27.1 pg (ref 26.0–34.0)
MCHC: 32.3 g/dL (ref 30.0–36.0)
MCV: 83.8 fL (ref 80.0–100.0)
Platelets: 218 10*3/uL (ref 150–400)
RBC: 5.43 MIL/uL — ABNORMAL HIGH (ref 3.87–5.11)
RDW: 13 % (ref 11.5–15.5)
WBC: 9.2 10*3/uL (ref 4.0–10.5)
nRBC: 0 % (ref 0.0–0.2)

## 2020-05-10 LAB — GLUCOSE, CAPILLARY: Glucose-Capillary: 155 mg/dL — ABNORMAL HIGH (ref 70–99)

## 2020-05-10 MED ORDER — FENTANYL CITRATE (PF) 100 MCG/2ML IJ SOLN
25.0000 ug | Freq: Once | INTRAMUSCULAR | Status: AC
  Administered 2020-05-11: 25 ug via INTRAVENOUS
  Filled 2020-05-10: qty 2

## 2020-05-10 NOTE — Progress Notes (Signed)
Patient tolerated ambulation in hallway well without oxygen, Sats 91-93%. IV's removed without complication and patient tolerated removal well. Patient is discharging with sister in law.

## 2020-05-10 NOTE — ED Provider Notes (Signed)
[facility] EMERGENCY DEPARTMENT Provider Note   CSN: [id number] Arrival date & time: 05/10/20  2259   History Chief Complaint  Patient presents with  . Fall  . Loss of Consciousness    [name] is a 74 y.o. female with severe AS, CHF EF 55%, DM who presents with dizziness, syncope, and a fall. She states that she went to the kitchen to let her sister in law Olegario Messier in the door when she suddenly had a "dizzy spell" and her legs gave out and she fell on the floor. She states that she passed out. She regained consciousness and was able to crawl to the chair. The fall occurred around 3pm today. She has not been able to walk since the accident or put weight on her L side so EMS was called tonight. She states she still feels mildly dizzy. She developed some substernal chest pressure after she fell but this has resolved and she denies CP before the fall. She also reports neck pain, L hip pain, L knee pain. She has chronic low back pain which she feels is unchanged. She denies abdominal pain, vomiting, diarrhea, urinary symptoms. She states the dizzy spells are not a new problem however it has developed in the past 3 months. Of note she was just discharged from the hospital for NSTEMI on 5/28. Cath showed non-obstructive CAD with severe aortic stenosis. TAVR is planned for the future.  HPI     Past Medical History:  Diagnosis Date  . Achilles tendinitis   . Acute renal failure (ARF) (HCC) 12/19/2014  . Calcaneal spur    right  . Cataract   . Depression   . Diabetes mellitus    type II  . Diverticulosis of colon (without mention of hemorrhage) 2013  . GERD (gastroesophageal reflux disease)   . GI bleed 03/26/14  . Hyperlipidemia   . Hypertension   . Internal hemorrhoids 2013  . Peripheral neuropathy   . Right shoulder pain    Subacromial tendinitis  . Venous insufficiency   . Ventral hernia     Patient Active Problem List   Diagnosis Date Noted  .  Nonrheumatic aortic valve stenosis   . Non-ST elevation myocardial infarction (NSTEMI) (HCC) 05/05/2020  . NSTEMI (non-ST elevated myocardial infarction) (HCC) 05/05/2020  . Diabetic neuropathy (HCC) 11/13/2018  . Left knee pain 10/24/2017  . Greater trochanteric bursitis of left hip 10/24/2017  . Reactive airway disease 04/09/2014  . Seasonal allergic rhinitis 04/09/2014  . Preventative health care 04/09/2014  . Abnormality of gait 03/17/2013  . Irritable bowel syndrome (IBS) 08/27/2012  . Type II diabetes mellitus with neurological manifestations, 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  . GERD 06/18/2007  . Hyperlipidemia associated with type 2 diabetes mellitus (HCC) 12/27/2006  . Essential hypertension 09/19/2006  . Venous (peripheral) insufficiency 09/19/2006    Past Surgical History:  Procedure Laterality Date  . ABDOMINAL HYSTERECTOMY  2001   TAH-BSO  . CHOLECYSTECTOMY  2001  . COLONOSCOPY  2013   diverticulosis   . ESOPHAGOGASTRODUODENOSCOPY  2013   normal   . INCISIONAL HERNIA REPAIR    . RIGHT/LEFT HEART CATH AND CORONARY ANGIOGRAPHY N/A 05/09/2020   Procedure: RIGHT/LEFT HEART CATH AND CORONARY ANGIOGRAPHY;  Surgeon: Corky Crafts, MD;  Location: [facility] INVASIVE CV LAB;  Service: Cardiovascular;  Laterality: N/A;  . TONSILLECTOMY       OB History   No obstetric history on file.     Family History  Problem Relation Age of Onset  .  Heart failure Father        Died in 58s  . CVA Father   . Ovarian cancer Sister   . Diabetes Sister   . Other Sister        died at birth  . Heart failure Mother        Died in 42s  . Other Brother        drowned  . Diabetes Paternal Grandmother   . Breast cancer Paternal Aunt   . Colon cancer Neg Hx     Social History   Tobacco Use  . Smoking status: Never Smoker  . Smokeless tobacco: Never Used  Substance Use Topics  . Alcohol use: No  . Drug use: No    Home Medications Prior to Admission medications   Medication Sig  Start Date End Date Taking? Authorizing Provider  aspirin 81 MG tablet Take 81 mg by mouth daily.    [provider]  atorvastatin (LIPITOR) 40 MG tablet Take 1 tablet (40 mg total) by mouth daily. 03/27/20 06/25/20  Marjie Skiff E, PA-C  citalopram (CELEXA) 20 MG tablet Take 1 tablet (20 mg total) by mouth daily. 03/07/20   Myrlene Broker, MD  Continuous Blood Gluc Sensor (FREESTYLE LIBRE SENSOR SYSTEM) MISC Use to monitor sugars 03/20/20   Myrlene Broker, MD  esomeprazole (NEXIUM) 20 MG capsule Take 1 capsule (20 mg total) by mouth daily at 12 noon. 03/07/20   Myrlene Broker, MD  ezetimibe (ZETIA) 10 MG tablet Take 1 tablet (10 mg total) by mouth daily. 03/20/20   Myrlene Broker, MD  furosemide (LASIX) 40 MG tablet Take 1 tablet (40 mg total) by mouth daily. 03/07/20   Myrlene Broker, MD  gabapentin (NEURONTIN) 300 MG capsule Take 1 capsule (300 mg total) by mouth 3 (three) times daily. 03/07/20   Myrlene Broker, MD  glucose blood [facility] VERIO) test strip Use as instructed 03/07/20   Myrlene Broker, MD  Insulin Lispro Prot & Lispro (HUMALOG MIX 75/25 KWIKPEN) (75-25) 100 UNIT/ML Kwikpen Inject 44 Units into the skin 2 (two) times daily. 08/30/19   Shamleffer, Konrad Dolores, MD  Insulin Pen Needle 31G X 8 MM MISC Use to inject insulin four times daily E11.41 04/06/15   Myrlene Broker, MD  lisinopril (ZESTRIL) 20 MG tablet Take 1 tablet (20 mg total) by mouth daily. 03/07/20   Myrlene Broker, MD  metFORMIN (GLUCOPHAGE) 1000 MG tablet Take 0.5 tablets (500 mg total) by mouth 2 (two) times daily with a meal. 03/07/20   Myrlene Broker, MD  metoprolol tartrate (LOPRESSOR) 25 MG tablet Take 0.5 tablets (12.5 mg total) by mouth 2 (two) times daily. 05/09/20   Arty Baumgartner, NP  sodium chloride (OCEAN) 0.65 % SOLN nasal spray Place 1 spray into both nostrils 4 (four) times daily. Patient taking differently: Place 1 spray into both  nostrils as needed for congestion.  03/07/20   Myrlene Broker, MD  Trolamine Salicylate (ASPERCREME EX) Apply 1 application topically daily as needed (shoulder/hips/knee/knuckles).    [provider]    Allergies    Morphine, Codeine sulfate, Demerol [meperidine], Meperidine hcl, and Propoxyphene hcl  Review of Systems   Review of Systems  Constitutional: Negative for chills and fever.  Respiratory: Negative for shortness of breath.   Cardiovascular: Positive for chest pain. Negative for palpitations and leg swelling.  Gastrointestinal: Negative for abdominal pain, diarrhea, nausea and vomiting.  Genitourinary: Negative for dysuria.  Musculoskeletal:  Positive for arthralgias, back pain and myalgias.  Neurological: Positive for dizziness and headaches. Negative for weakness and numbness.  All other systems reviewed and are negative.   Physical Exam Updated Vital Signs BP (!) 145/72 (BP Location: Right Arm)   Pulse 84   Resp 20   SpO2 97%   Physical Exam Vitals and nursing note reviewed.  Constitutional:      General: She is not in acute distress.    Appearance: Normal appearance. She is well-developed. She is not ill-appearing.     Comments: Elderly female in NAD. In C-collar. Alert and oriented  HENT:     Head: Normocephalic and atraumatic.     Comments: Mild bruising over the forehead Eyes:     General: No scleral icterus.       Right eye: No discharge.        Left eye: No discharge.     Conjunctiva/sclera: Conjunctivae normal.     Pupils: Pupils are equal, round, and reactive to light.  Cardiovascular:     Rate and Rhythm: Normal rate and regular rhythm.  Pulmonary:     Effort: Pulmonary effort is normal. No respiratory distress.     Breath sounds: Normal breath sounds.     Comments: On 6L via [place] Abdominal:     General: There is no distension.     Palpations: Abdomen is soft.     Tenderness: There is no abdominal tenderness.  Musculoskeletal:      Cervical back: Normal range of motion.     Comments: Normal ROM of the upper extremities  Tenderness of the L hip and knee. No ankle or foot tenderness. 2+ DP pulse  No RLE tenderness   Skin:    General: Skin is warm and dry.  Neurological:     Mental Status: She is alert and oriented to person, place, and time.  Psychiatric:        Behavior: Behavior normal.     ED Results / Procedures / Treatments   Labs (all labs ordered are listed, but only abnormal results are displayed) Labs Reviewed  COMPREHENSIVE METABOLIC PANEL - Abnormal; Notable for the following components:      Result Value   Sodium 134 (*)    Chloride 95 (*)    Glucose, Bld 261 (*)    BUN 33 (*)    Creatinine, Ser 1.14 (*)    Albumin 3.2 (*)    AST 44 (*)    ALT 46 (*)    Alkaline Phosphatase 179 (*)    GFR calc non Af Amer 48 (*)    GFR calc Af Amer 55 (*)    All other components within normal limits  CBC WITH DIFFERENTIAL/PLATELET - Abnormal; Notable for the following components:   WBC 15.0 (*)    RBC 5.46 (*)    HCT 46.3 (*)    Neutro Abs 11.1 (*)    Monocytes Absolute 1.1 (*)    Eosinophils Absolute 0.6 (*)    All other components within normal limits  URINALYSIS, ROUTINE W REFLEX MICROSCOPIC - Abnormal; Notable for the following components:   APPearance HAZY (*)    Glucose, UA 150 (*)    Hgb urine dipstick SMALL (*)    Protein, ur 30 (*)    Leukocytes,Ua LARGE (*)    WBC, UA >50 (*)    Bacteria, UA FEW (*)    Non Squamous Epithelial 0-5 (*)    All other components within normal limits  SARS CORONAVIRUS 2  BY RT PCR (HOSPITAL ORDER, PERFORMED IN [facility])  BRAIN NATRIURETIC PEPTIDE    EKG None  Radiology DG Chest 1 View  Result Date: 05/10/2020 CLINICAL DATA:  74 year old female chest pain. EXAM: CHEST  1 VIEW COMPARISON:  Chest radiograph dated 05/05/2020. FINDINGS: No focal consolidation, pleural effusion, pneumothorax. The cardiac silhouette is within normal limits.  Coronary vascular calcification. No acute osseous pathology. IMPRESSION: No focal consolidation. Electronically Signed   By: Elgie CollardArash  Radparvar M.D.   On: 05/10/2020 23:54   CT Head Wo Contrast  Result Date: 05/10/2020 CLINICAL DATA:  Recent fall EXAM: CT HEAD WITHOUT CONTRAST CT CERVICAL SPINE WITHOUT CONTRAST TECHNIQUE: Multidetector CT imaging of the head and cervical spine was performed following the standard protocol without intravenous contrast. Multiplanar CT image reconstructions of the cervical spine were also generated. COMPARISON:  12/19/2014 FINDINGS: CT HEAD FINDINGS Brain: Mild atrophic changes and chronic white matter ischemic change is seen. No acute hemorrhage, acute infarction or space-occupying mass lesion are noted. Vascular: No hyperdense vessel or unexpected calcification. Skull: Normal. Negative for fracture or focal lesion. Sinuses/Orbits: No acute finding. Other: None. CT CERVICAL SPINE FINDINGS Alignment: Within normal limits. Skull base and vertebrae: 7 cervical segments are well visualized. Vertebral body height is well maintained. Disc space narrowing is noted at C5-6 and C6-7 associated osteophytic changes. Mild facet hypertrophic changes are seen as well at multiple levels. No acute fracture or acute facet abnormality is noted. Soft tissues and spinal canal: Scattered small hypodensities within the thyroid are noted. These are less than 1 cm in size. Remaining soft tissue structures are within normal limits. Upper chest: Visualized lung apices are within normal limits. Other: None IMPRESSION: CT of the head: Chronic atrophic and ischemic changes without acute abnormality. CT of the cervical spine: Multilevel degenerative change without acute abnormality. Scattered small hypodensities within the thyroid. No followup recommended (ref: J Am Coll Radiol. 2015 Feb;12(2): 143-50). Electronically Signed   By: Alcide CleverMark  Lukens M.D.   On: 05/10/2020 23:53   CT Cervical Spine Wo  Contrast  Result Date: 05/10/2020 CLINICAL DATA:  Recent fall EXAM: CT HEAD WITHOUT CONTRAST CT CERVICAL SPINE WITHOUT CONTRAST TECHNIQUE: Multidetector CT imaging of the head and cervical spine was performed following the standard protocol without intravenous contrast. Multiplanar CT image reconstructions of the cervical spine were also generated. COMPARISON:  12/19/2014 FINDINGS: CT HEAD FINDINGS Brain: Mild atrophic changes and chronic white matter ischemic change is seen. No acute hemorrhage, acute infarction or space-occupying mass lesion are noted. Vascular: No hyperdense vessel or unexpected calcification. Skull: Normal. Negative for fracture or focal lesion. Sinuses/Orbits: No acute finding. Other: None. CT CERVICAL SPINE FINDINGS Alignment: Within normal limits. Skull base and vertebrae: 7 cervical segments are well visualized. Vertebral body height is well maintained. Disc space narrowing is noted at C5-6 and C6-7 associated osteophytic changes. Mild facet hypertrophic changes are seen as well at multiple levels. No acute fracture or acute facet abnormality is noted. Soft tissues and spinal canal: Scattered small hypodensities within the thyroid are noted. These are less than 1 cm in size. Remaining soft tissue structures are within normal limits. Upper chest: Visualized lung apices are within normal limits. Other: None IMPRESSION: CT of the head: Chronic atrophic and ischemic changes without acute abnormality. CT of the cervical spine: Multilevel degenerative change without acute abnormality. Scattered small hypodensities within the thyroid. No followup recommended (ref: J Am Coll Radiol. 2015 Feb;12(2): 143-50). Electronically Signed   By: Eulah PontMark  Lukens M.D.  On: 05/10/2020 23:53   CARDIAC CATHETERIZATION  Result Date: 05/09/2020  1st Mrg lesion is 25% stenosed.  2nd Mrg lesion is 25% stenosed.  The left ventricular systolic function is normal.  LV end diastolic pressure is normal.  The left  ventricular ejection fraction is 55-65% by visual estimate.  There is severe aortic valve stenosis. Calculated aortic valve area 0.76 cm.  Aortic saturation 99%, PA saturation 68%, mean PA pressure 12 mmHg, mean PCWP 6 mmHg. Cardiac output 4.0 L/min. Cardiac index 2.16.  Nonobstructive coronary artery disease.  Severe aortic stenosis. We will forward to Dr. Angelena Form to decide on plans for TAVR. I was unable to reach her sister, Briscoe Deutscher.  No voicemail option was available.   DG Knee Complete 4 Views Left  Result Date: 05/11/2020 CLINICAL DATA:  Fall, knee pain EXAM: LEFT KNEE - COMPLETE 4+ VIEW COMPARISON:  None. FINDINGS: Advanced tricompartment degenerative changes with joint space narrowing and spurring. Trace associated joint effusion. No acute bony abnormality. Specifically, no fracture, subluxation, or dislocation. IMPRESSION: Advanced degenerative changes and trace joint effusion. No acute bony abnormality. Electronically Signed   By: Rolm Baptise M.D.   On: 05/11/2020 00:01   DG Hip Unilat W or Wo Pelvis 2-3 Views Left  Result Date: 05/10/2020 CLINICAL DATA:  Hip pain, fall EXAM: DG HIP (WITH OR WITHOUT PELVIS) 2-3V LEFT COMPARISON:  None. FINDINGS: Hip joints and SI joints are symmetric and unremarkable. No acute bony abnormality. Specifically, no fracture, subluxation, or dislocation. IMPRESSION: No acute bony abnormality. Electronically Signed   By: Rolm Baptise M.D.   On: 05/10/2020 23:58    Procedures Procedures (including critical care time)  Medications Ordered in ED Medications  fentaNYL (SUBLIMAZE) injection 25 mcg (25 mcg Intravenous Given 05/11/20 0001)  iohexol (OMNIPAQUE) 350 MG/ML injection 75 mL (75 mLs Intravenous Contrast Given 05/11/20 0126)    ED Course  I have reviewed the triage vital signs and the nursing notes.  Pertinent labs & imaging results that were available during my care of the patient were reviewed by me and considered in my medical decision making  (see chart for details).  74 year old female presents with dizziness, a syncopal episode, and a fall. She is noted to be hypoxic here with sats in the mid to upper 80s. She is requiring 6L via [place] but does not report any shortness of breath. On exam she is alert and oriented. She has bruising of the forehead and is in a C-collar. Heart is regular rate and rhythm. Lungs are CTA. Abdomen is soft and non-tender. She has tenderness of the L hip and L knee. Will obtain EKG, imaging, labs.  CT head and C-spine are negative. CXR is negative. L hip and L knee xrays are negative for acute abnormality. CBC shows leukocytosis (15). CMP shows hyperglycemia (261), mild AKI (SCr 1.1) and mild transaminitis. BNP is 72. Due to unexplained hypoxia will obtain CTA chest to r/o PE and COVID swab.  CT is negative. Discussed with cardiology - they recommend medical admission and they will see in the AM.  Discussed with Dr. Myna Hidalgo with [facility] who will admit.   MDM Rules/Calculators/A&P   Final Clinical Impression(s) / ED Diagnoses Final diagnoses:  Acute respiratory failure with hypoxia ([place])  Syncope, unspecified syncope type    Rx / DC Orders ED Discharge Orders    None       Recardo Evangelist, PA-C 05/11/20 0532    Merrily Pew, MD 05/11/20 [phone]

## 2020-05-10 NOTE — Progress Notes (Signed)
Progress Note  Patient Name: [name] Date of Encounter: 05/10/2020  Primary Cardiologist: Skeet Latch, MD  Subjective   No chest pain or dyspnea  Inpatient Medications    Scheduled Meds: . aspirin EC  81 mg Oral Daily  . atorvastatin  40 mg Oral Daily  . citalopram  20 mg Oral Daily  . ezetimibe  10 mg Oral Daily  . furosemide  40 mg Intravenous BID  . gabapentin  300 mg Oral TID  . insulin aspart  0-15 Units Subcutaneous TID WC  . insulin aspart  0-5 Units Subcutaneous QHS  . insulin aspart  8 Units Subcutaneous TID WC  . insulin glargine  20 Units Subcutaneous BID  . lisinopril  20 mg Oral Daily  . metoprolol tartrate  12.5 mg Oral BID  . pantoprazole  40 mg Oral Daily  . sodium chloride flush  3 mL Intravenous Q12H   Continuous Infusions: . sodium chloride     PRN Meds: sodium chloride, acetaminophen, Gerhardt's butt cream, magnesium hydroxide, nitroGLYCERIN, ondansetron (ZOFRAN) IV, sodium chloride flush   Vital Signs    Vitals:   05/09/20 2136 05/09/20 2350 05/10/20 0351 05/10/20 0734  BP: 109/63 113/67 118/74 102/64  Pulse: 73 66 65 77  Resp:  18 17 17   Temp:  98.4 F (36.9 C) 97.6 F (36.4 C) 97.6 F (36.4 C)  TempSrc:  Oral Oral Oral  SpO2:  94% 95% 92%  Weight:      Height:        Intake/Output Summary (Last 24 hours) at 05/10/2020 0853 Last data filed at 05/10/2020 0735 Gross per 24 hour  Intake 766.97 ml  Output --  Net 766.97 ml   Filed Weights   05/05/20 1426 05/05/20 2056 05/08/20 0623  Weight: 88.9 kg 85.4 kg 82.6 kg    Telemetry    sinus- Personally reviewed.  ECG    No AM EKG  Physical Exam   General: Well developed, well nourished, NAD  HEENT: OP clear, mucus membranes moist  SKIN: warm, dry. No rashes. Neuro: No focal deficits  Musculoskeletal: Muscle strength 5/5 all ext  Psychiatric: Mood and affect normal  Neck: No JVD, no carotid bruits, no thyromegaly, no lymphadenopathy.  Lungs:Clear  bilaterally, no wheezes, rhonci, crackles Cardiovascular: Regular rate and rhythm. Loud systolic murmur.  Abdomen:Soft. Bowel sounds present. Non-tender.  Extremities: No lower extremity edema. Pulses are 2 + in the bilateral DP/PT.  Labs    Chemistry Recent Labs  Lab 05/07/20 [phone] 05/07/20 9371 05/08/20 0236 05/08/20 0236 05/09/20 6967 05/09/20 0907 05/09/20 0925  NA 135   < > 137   < > 138 139 138  K 3.7   < > 4.9   < > 3.1* 3.5 3.4*  CL 96*  --  96*  --  98  --   --   CO2 28  --  33*  --  30  --   --   GLUCOSE 257*  --  215*  --  135*  --   --   BUN 21  --  21  --  17  --   --   CREATININE 0.85  --  1.10*  --  0.75  --   --   CALCIUM 8.4*  --  8.6*  --  8.5*  --   --   GFRNONAA >60  --  50*  --  >60  --   --   GFRAA >60  --  58*  --  >  60  --   --   ANIONGAP 11  --  8  --  10  --   --    < > = values in this interval not displayed.     Hematology Recent Labs  Lab 05/08/20 0236 05/08/20 0236 05/09/20 0254 05/09/20 0254 05/09/20 5456 05/09/20 0925 05/10/20 0154  WBC 10.4  --  10.0  --   --   --  9.2  RBC 5.17*  --  5.35*  --   --   --  5.43*  HGB 14.2   < > 14.6   < > 14.6 13.9 14.7  HCT 43.5   < > 44.8   < > 43.0 41.0 45.5  MCV 84.1  --  83.7  --   --   --  83.8  MCH 27.5  --  27.3  --   --   --  27.1  MCHC 32.6  --  32.6  --   --   --  32.3  RDW 12.6  --  12.7  --   --   --  13.0  PLT 229  --  215  --   --   --  218   < > = values in this interval not displayed.    Cardiac Enzymes Recent Labs  Lab 05/05/20 1446 05/05/20 1718  TROPONINIHS 149* 128*    BNP Recent Labs  Lab 05/05/20 1449  BNP 215.2*     DDimer Recent Labs  Lab 05/05/20 1458  DDIMER 0.49     Radiology    CARDIAC CATHETERIZATION  Result Date: 05/09/2020  1st Mrg lesion is 25% stenosed.  2nd Mrg lesion is 25% stenosed.  The left ventricular systolic function is normal.  LV end diastolic pressure is normal.  The left ventricular ejection fraction is 55-65% by visual  estimate.  There is severe aortic valve stenosis. Calculated aortic valve area 0.76 cm.  Aortic saturation 99%, PA saturation 68%, mean PA pressure 12 mmHg, mean PCWP 6 mmHg. Cardiac output 4.0 L/min. Cardiac index 2.16.  Nonobstructive coronary artery disease.  Severe aortic stenosis. We will forward to Dr. Clifton James to decide on plans for TAVR. I was unable to reach her sister, Stann Mainland.  No voicemail option was available.    Patient Profile     74 y.o. female with a history of hypertension, type 2 diabetes mellitus and hyperlipidemia now presenting with NSTEMI.  Assessment & Plan    1.  CAD with elevated troponin: High-sensitivity troponin I peak at 149 then trending down to 128.  LVEF 55 to 60% without wall motion abnormalities by echocardiography. Cardiac cath 05/09/20 with mild non-obstructive CAD. Continue ASA, statin and beta blocker.     2.  Severe aortic stenosis: This is likely contributing to her presentation. Cath data with mean gradient of 33 mmHg and AVA 0.76 cm2. She will go home today and we will continue workup for TAVR as an outpatient. She will be contacted by the valve team to arrange this.   3.  Essential hypertension: BP controlled on current therapy.  3.  Type 2 diabetes mellitus, sliding scale.  4.  Mixed hyperlipidemia: Continue statin and Zetia.   5.  Acute on chronic diastolic CHF: She has diuresed well. Filling pressures normal by cath. Lasix 40 mg daily at home following discharge.    Discharge home today  Signed, Verne Carrow, MD  05/10/2020, 8:53 AM

## 2020-05-10 NOTE — Consult Note (Signed)
   [facility] CM Inpatient Consult   05/10/2020  [name] [birth year] [id number]   Patient evaluated for community based chronic disease management services with [facility] Care Management Program as a benefit of patient's [employer].  Patient previously with a HX of [facility] [facility] [employer] Care Management and hospital follow up for diabetes needs assessed and patient with NSTEMI per admission.  Patient's last noted A1C 11.2. Primary Care Provider:  Hillard Danker, MD with [facility], this office is listed to provide the transition of care follow up.  Patient will receive post hospital discharge call and for assessments fo community needs for complex disease management.    Of note, [facility] Care Management services does not replace or interfere with any services that are arranged by inpatient Transition of Care [TOC] team     For additional questions or referrals please contact:    Charlesetta Shanks, RN BSN CCM [facility]  [phone] business mobile phone Toll free office [phone]  Fax number: [phone] [email or web address]  [email or web address]

## 2020-05-10 NOTE — Plan of Care (Signed)

## 2020-05-10 NOTE — ED Triage Notes (Signed)
At this time pt c/o light sensitivity.

## 2020-05-10 NOTE — ED Triage Notes (Signed)
Pt arrives to ED via [facility] from home following unwitnessed fall with loss of consciousness at approx 3pm. Pt c/o pain to left forehead head and left hip pain. At this time EMS sheet binder holding hips and pt cannot strighten left leg. Unknown amount of time down, pt rembers waking up on floor. Preceding fall pt c/o dizziness. Provider at bedside. PT a+O x4 at this time and VSS.

## 2020-05-10 NOTE — Plan of Care (Signed)
Discharge instructions given to patient. Questions answered. Educated on new medication regimen, signs/symptoms, restrictions, and follow up appointments. Prescription sent to [facility] pharmacy. PIV DC, hemostasis achieved. Vital signs stable. All belongings sent home with patient.   Pt escorted by NT via wheelchair to private vehicle driven by family.   Pt ambulated with RN in hallway with standby assist and front wheel walker. Tolerated well on room air, 94% SpO2.   Problem: Education: Goal: Knowledge of General Education information will improve Description: Including pain rating scale, medication(s)/side effects and non-pharmacologic comfort measures Outcome: Adequate for Discharge   Problem: Health Behavior/Discharge Planning: Goal: Ability to manage health-related needs will improve Outcome: Adequate for Discharge   Problem: Clinical Measurements: Goal: Ability to maintain clinical measurements within normal limits will improve Outcome: Adequate for Discharge Goal: Will remain free from infection Outcome: Adequate for Discharge Goal: Diagnostic test results will improve Outcome: Adequate for Discharge Goal: Respiratory complications will improve Outcome: Adequate for Discharge Goal: Cardiovascular complication will be avoided Outcome: Adequate for Discharge   Problem: Activity: Goal: Risk for activity intolerance will decrease Outcome: Adequate for Discharge   Problem: Nutrition: Goal: Adequate nutrition will be maintained Outcome: Adequate for Discharge   Problem: Coping: Goal: Level of anxiety will decrease Outcome: Adequate for Discharge   Problem: Elimination: Goal: Will not experience complications related to bowel motility Outcome: Adequate for Discharge Goal: Will not experience complications related to urinary retention Outcome: Adequate for Discharge   Problem: Pain Managment: Goal: General experience of comfort will improve Outcome: Adequate for  Discharge   Problem: Safety: Goal: Ability to remain free from injury will improve Outcome: Adequate for Discharge   Problem: Skin Integrity: Goal: Risk for impaired skin integrity will decrease Outcome: Adequate for Discharge   Problem: Education: Goal: Understanding of CV disease, CV risk reduction, and recovery process will improve Outcome: Adequate for Discharge Goal: Individualized Educational Video(s) Outcome: Adequate for Discharge   Problem: Activity: Goal: Ability to return to baseline activity level will improve Outcome: Adequate for Discharge   Problem: Cardiovascular: Goal: Ability to achieve and maintain adequate cardiovascular perfusion will improve Outcome: Adequate for Discharge Goal: Vascular access site(s) Level 0-1 will be maintained Outcome: Adequate for Discharge   Problem: Health Behavior/Discharge Planning: Goal: Ability to safely manage health-related needs after discharge will improve Outcome: Adequate for Discharge

## 2020-05-11 ENCOUNTER — Inpatient Hospital Stay (HOSPITAL_COMMUNITY): Payer: Medicare Other

## 2020-05-11 ENCOUNTER — Encounter (HOSPITAL_COMMUNITY): Payer: Self-pay | Admitting: Radiology

## 2020-05-11 ENCOUNTER — Emergency Department (HOSPITAL_COMMUNITY): Payer: Medicare Other

## 2020-05-11 DIAGNOSIS — N179 Acute kidney failure, unspecified: Secondary | ICD-10-CM | POA: Diagnosis not present

## 2020-05-11 DIAGNOSIS — K089 Disorder of teeth and supporting structures, unspecified: Secondary | ICD-10-CM | POA: Diagnosis not present

## 2020-05-11 DIAGNOSIS — Z006 Encounter for examination for normal comparison and control in clinical research program: Secondary | ICD-10-CM | POA: Diagnosis not present

## 2020-05-11 DIAGNOSIS — I951 Orthostatic hypotension: Secondary | ICD-10-CM | POA: Diagnosis not present

## 2020-05-11 DIAGNOSIS — R601 Generalized edema: Secondary | ICD-10-CM | POA: Diagnosis not present

## 2020-05-11 DIAGNOSIS — E1169 Type 2 diabetes mellitus with other specified complication: Secondary | ICD-10-CM

## 2020-05-11 DIAGNOSIS — Z20822 Contact with and (suspected) exposure to covid-19: Secondary | ICD-10-CM | POA: Diagnosis not present

## 2020-05-11 DIAGNOSIS — E1149 Type 2 diabetes mellitus with other diabetic neurological complication: Secondary | ICD-10-CM | POA: Diagnosis not present

## 2020-05-11 DIAGNOSIS — K029 Dental caries, unspecified: Secondary | ICD-10-CM | POA: Diagnosis present

## 2020-05-11 DIAGNOSIS — F329 Major depressive disorder, single episode, unspecified: Secondary | ICD-10-CM | POA: Diagnosis present

## 2020-05-11 DIAGNOSIS — E1165 Type 2 diabetes mellitus with hyperglycemia: Secondary | ICD-10-CM | POA: Diagnosis present

## 2020-05-11 DIAGNOSIS — Z954 Presence of other heart-valve replacement: Secondary | ICD-10-CM | POA: Diagnosis not present

## 2020-05-11 DIAGNOSIS — E1140 Type 2 diabetes mellitus with diabetic neuropathy, unspecified: Secondary | ICD-10-CM | POA: Diagnosis not present

## 2020-05-11 DIAGNOSIS — E11649 Type 2 diabetes mellitus with hypoglycemia without coma: Secondary | ICD-10-CM | POA: Diagnosis not present

## 2020-05-11 DIAGNOSIS — E869 Volume depletion, unspecified: Secondary | ICD-10-CM | POA: Diagnosis present

## 2020-05-11 DIAGNOSIS — Z01818 Encounter for other preprocedural examination: Secondary | ICD-10-CM | POA: Diagnosis not present

## 2020-05-11 DIAGNOSIS — R0602 Shortness of breath: Secondary | ICD-10-CM | POA: Diagnosis not present

## 2020-05-11 DIAGNOSIS — I350 Nonrheumatic aortic (valve) stenosis: Principal | ICD-10-CM

## 2020-05-11 DIAGNOSIS — I214 Non-ST elevation (NSTEMI) myocardial infarction: Secondary | ICD-10-CM | POA: Diagnosis not present

## 2020-05-11 DIAGNOSIS — R5382 Chronic fatigue, unspecified: Secondary | ICD-10-CM | POA: Diagnosis not present

## 2020-05-11 DIAGNOSIS — I10 Essential (primary) hypertension: Secondary | ICD-10-CM | POA: Diagnosis not present

## 2020-05-11 DIAGNOSIS — Z952 Presence of prosthetic heart valve: Secondary | ICD-10-CM | POA: Diagnosis not present

## 2020-05-11 DIAGNOSIS — E669 Obesity, unspecified: Secondary | ICD-10-CM | POA: Diagnosis present

## 2020-05-11 DIAGNOSIS — J9601 Acute respiratory failure with hypoxia: Secondary | ICD-10-CM | POA: Diagnosis not present

## 2020-05-11 DIAGNOSIS — E785 Hyperlipidemia, unspecified: Secondary | ICD-10-CM | POA: Diagnosis not present

## 2020-05-11 DIAGNOSIS — G8929 Other chronic pain: Secondary | ICD-10-CM | POA: Diagnosis present

## 2020-05-11 DIAGNOSIS — E1142 Type 2 diabetes mellitus with diabetic polyneuropathy: Secondary | ICD-10-CM | POA: Diagnosis present

## 2020-05-11 DIAGNOSIS — K219 Gastro-esophageal reflux disease without esophagitis: Secondary | ICD-10-CM | POA: Diagnosis present

## 2020-05-11 DIAGNOSIS — R55 Syncope and collapse: Secondary | ICD-10-CM | POA: Diagnosis not present

## 2020-05-11 DIAGNOSIS — R7401 Elevation of levels of liver transaminase levels: Secondary | ICD-10-CM | POA: Diagnosis present

## 2020-05-11 DIAGNOSIS — I471 Supraventricular tachycardia: Secondary | ICD-10-CM | POA: Diagnosis not present

## 2020-05-11 DIAGNOSIS — I700 Atherosclerosis of aorta: Secondary | ICD-10-CM | POA: Diagnosis present

## 2020-05-11 DIAGNOSIS — I2510 Atherosclerotic heart disease of native coronary artery without angina pectoris: Secondary | ICD-10-CM | POA: Diagnosis present

## 2020-05-11 DIAGNOSIS — L8990 Pressure ulcer of unspecified site, unspecified stage: Secondary | ICD-10-CM | POA: Diagnosis present

## 2020-05-11 DIAGNOSIS — N390 Urinary tract infection, site not specified: Secondary | ICD-10-CM | POA: Diagnosis present

## 2020-05-11 DIAGNOSIS — W1830XA Fall on same level, unspecified, initial encounter: Secondary | ICD-10-CM | POA: Diagnosis present

## 2020-05-11 LAB — COMPREHENSIVE METABOLIC PANEL
ALT: 46 U/L — ABNORMAL HIGH (ref 0–44)
AST: 44 U/L — ABNORMAL HIGH (ref 15–41)
Albumin: 3.2 g/dL — ABNORMAL LOW (ref 3.5–5.0)
Alkaline Phosphatase: 179 U/L — ABNORMAL HIGH (ref 38–126)
Anion gap: 10 (ref 5–15)
BUN: 33 mg/dL — ABNORMAL HIGH (ref 8–23)
CO2: 29 mmol/L (ref 22–32)
Calcium: 8.9 mg/dL (ref 8.9–10.3)
Chloride: 95 mmol/L — ABNORMAL LOW (ref 98–111)
Creatinine, Ser: 1.14 mg/dL — ABNORMAL HIGH (ref 0.44–1.00)
GFR calc Af Amer: 55 mL/min — ABNORMAL LOW (ref >60)
GFR calc non Af Amer: 48 mL/min — ABNORMAL LOW (ref >60)
Glucose, Bld: 261 mg/dL — ABNORMAL HIGH (ref 70–99)
Potassium: 4.5 mmol/L (ref 3.5–5.1)
Sodium: 134 mmol/L — ABNORMAL LOW (ref 135–145)
Total Bilirubin: 0.5 mg/dL (ref 0.3–1.2)
Total Protein: 7.4 g/dL (ref 6.5–8.1)

## 2020-05-11 LAB — CBC WITH DIFFERENTIAL/PLATELET
Abs Immature Granulocytes: 0.05 10*3/uL (ref 0.00–0.07)
Basophils Absolute: 0.1 10*3/uL (ref 0.0–0.1)
Basophils Relative: 1 %
Eosinophils Absolute: 0.6 10*3/uL — ABNORMAL HIGH (ref 0.0–0.5)
Eosinophils Relative: 4 %
HCT: 46.3 % — ABNORMAL HIGH (ref 36.0–46.0)
Hemoglobin: 14.8 g/dL (ref 12.0–15.0)
Immature Granulocytes: 0 %
Lymphocytes Relative: 13 %
Lymphs Abs: 1.9 10*3/uL (ref 0.7–4.0)
MCH: 27.1 pg (ref 26.0–34.0)
MCHC: 32 g/dL (ref 30.0–36.0)
MCV: 84.8 fL (ref 80.0–100.0)
Monocytes Absolute: 1.1 10*3/uL — ABNORMAL HIGH (ref 0.1–1.0)
Monocytes Relative: 8 %
Neutro Abs: 11.1 10*3/uL — ABNORMAL HIGH (ref 1.7–7.7)
Neutrophils Relative %: 74 %
Platelets: 219 10*3/uL (ref 150–400)
RBC: 5.46 MIL/uL — ABNORMAL HIGH (ref 3.87–5.11)
RDW: 13 % (ref 11.5–15.5)
WBC: 15 10*3/uL — ABNORMAL HIGH (ref 4.0–10.5)
nRBC: 0 % (ref 0.0–0.2)

## 2020-05-11 LAB — URINE CULTURE

## 2020-05-11 LAB — URINALYSIS, ROUTINE W REFLEX MICROSCOPIC
Bilirubin Urine: NEGATIVE
Glucose, UA: 150 mg/dL — AB
Ketones, ur: NEGATIVE mg/dL
Nitrite: NEGATIVE
Protein, ur: 30 mg/dL — AB
Specific Gravity, Urine: 1.02 (ref 1.005–1.030)
WBC, UA: >50 WBC/hpf — ABNORMAL HIGH (ref 0–5)
pH: 5 (ref 5.0–8.0)

## 2020-05-11 LAB — BRAIN NATRIURETIC PEPTIDE: B Natriuretic Peptide: 72.9 pg/mL (ref 0.0–100.0)

## 2020-05-11 LAB — SARS CORONAVIRUS 2 BY RT PCR (HOSPITAL ORDER, PERFORMED IN ~~LOC~~ HOSPITAL LAB): SARS Coronavirus 2: NEGATIVE

## 2020-05-11 LAB — GLUCOSE, CAPILLARY
Glucose-Capillary: 262 mg/dL — ABNORMAL HIGH (ref 70–99)
Glucose-Capillary: 162 mg/dL — ABNORMAL HIGH (ref 70–99)

## 2020-05-11 MED ORDER — IOHEXOL 350 MG/ML SOLN
75.0000 mL | Freq: Once | INTRAVENOUS | Status: AC | PRN
  Administered 2020-05-11: 75 mL via INTRAVENOUS

## 2020-05-11 MED ORDER — SODIUM CHLORIDE 0.9 % IV SOLN
1.0000 g | INTRAVENOUS | Status: AC
  Administered 2020-05-11 – 2020-05-13 (×3): 1 g via INTRAVENOUS
  Filled 2020-05-11: qty 10
  Filled 2020-05-11 (×2): qty 1

## 2020-05-11 MED ORDER — METOPROLOL TARTRATE 12.5 MG HALF TABLET
12.5000 mg | ORAL_TABLET | Freq: Two times a day (BID) | ORAL | Status: DC
  Administered 2020-05-11 – 2020-05-16 (×11): 12.5 mg via ORAL
  Filled 2020-05-11 (×13): qty 1

## 2020-05-11 MED ORDER — ACETAMINOPHEN 650 MG RE SUPP: 650.0000 mg | Freq: Four times a day (QID) | RECTAL | Status: DC | PRN

## 2020-05-11 MED ORDER — CITALOPRAM HYDROBROMIDE 20 MG PO TABS
20.0000 mg | ORAL_TABLET | Freq: Every day | ORAL | Status: DC
  Administered 2020-05-11 – 2020-05-19 (×8): 20 mg via ORAL
  Filled 2020-05-11 (×7): qty 1
  Filled 2020-05-11: qty 2

## 2020-05-11 MED ORDER — PANTOPRAZOLE SODIUM 40 MG PO TBEC
40.0000 mg | DELAYED_RELEASE_TABLET | Freq: Every day | ORAL | Status: DC
  Administered 2020-05-11 – 2020-05-19 (×8): 40 mg via ORAL
  Filled 2020-05-11 (×8): qty 1

## 2020-05-11 MED ORDER — GABAPENTIN 300 MG PO CAPS
300.0000 mg | ORAL_CAPSULE | Freq: Three times a day (TID) | ORAL | Status: DC
  Administered 2020-05-11 – 2020-05-19 (×23): 300 mg via ORAL
  Filled 2020-05-11 (×6): qty 1
  Filled 2020-05-11: qty 3
  Filled 2020-05-11 (×16): qty 1

## 2020-05-11 MED ORDER — ASPIRIN EC 81 MG PO TBEC
81.0000 mg | DELAYED_RELEASE_TABLET | Freq: Every day | ORAL | Status: DC
  Administered 2020-05-11 – 2020-05-19 (×8): 81 mg via ORAL
  Filled 2020-05-11 (×8): qty 1

## 2020-05-11 MED ORDER — NAPHAZOLINE-GLYCERIN 0.012-0.2 % OP SOLN
1.0000 [drp] | Freq: Four times a day (QID) | OPHTHALMIC | Status: DC | PRN
  Filled 2020-05-11: qty 15

## 2020-05-11 MED ORDER — SODIUM CHLORIDE 0.9 % IV SOLN: INTRAVENOUS | Status: AC

## 2020-05-11 MED ORDER — INSULIN ASPART 100 UNIT/ML ~~LOC~~ SOLN
0.0000 [IU] | Freq: Three times a day (TID) | SUBCUTANEOUS | Status: DC
  Administered 2020-05-12: 3 [IU] via SUBCUTANEOUS
  Administered 2020-05-12 – 2020-05-13 (×3): 2 [IU] via SUBCUTANEOUS

## 2020-05-11 MED ORDER — SODIUM CHLORIDE 0.9 % IV SOLN: INTRAVENOUS | Status: DC

## 2020-05-11 MED ORDER — EZETIMIBE 10 MG PO TABS
10.0000 mg | ORAL_TABLET | Freq: Every day | ORAL | Status: DC
  Administered 2020-05-11 – 2020-05-19 (×8): 10 mg via ORAL
  Filled 2020-05-11 (×9): qty 1

## 2020-05-11 MED ORDER — INSULIN ASPART PROT & ASPART (70-30 MIX) 100 UNIT/ML ~~LOC~~ SUSP
45.0000 [IU] | Freq: Two times a day (BID) | SUBCUTANEOUS | Status: DC
  Administered 2020-05-11 – 2020-05-12 (×3): 45 [IU] via SUBCUTANEOUS
  Filled 2020-05-11 (×2): qty 10

## 2020-05-11 MED ORDER — ACETAMINOPHEN 325 MG PO TABS
650.0000 mg | ORAL_TABLET | Freq: Four times a day (QID) | ORAL | Status: DC | PRN
  Administered 2020-05-13 – 2020-05-19 (×2): 650 mg via ORAL
  Filled 2020-05-11 (×2): qty 2

## 2020-05-11 MED ORDER — ENOXAPARIN SODIUM 40 MG/0.4ML ~~LOC~~ SOLN
40.0000 mg | SUBCUTANEOUS | Status: DC
  Administered 2020-05-11 – 2020-05-15 (×5): 40 mg via SUBCUTANEOUS
  Filled 2020-05-11 (×5): qty 0.4

## 2020-05-11 MED ORDER — ATORVASTATIN CALCIUM 40 MG PO TABS
40.0000 mg | ORAL_TABLET | Freq: Every day | ORAL | Status: DC
  Administered 2020-05-12 – 2020-05-19 (×7): 40 mg via ORAL
  Filled 2020-05-11 (×8): qty 1

## 2020-05-11 MED ORDER — SODIUM CHLORIDE 0.9% FLUSH
3.0000 mL | Freq: Two times a day (BID) | INTRAVENOUS | Status: DC
  Administered 2020-05-11 – 2020-05-19 (×15): 3 mL via INTRAVENOUS

## 2020-05-11 NOTE — ED Notes (Signed)
Attempted pt's orthos, became dizzy upon sitting up and pressure dropped so did not attempt to stand pt up

## 2020-05-11 NOTE — Evaluation (Signed)
Physical Therapy Evaluation Patient Details Name: [name] MRN: [id number] DOB: 03-16-1946 Today's Date: 05/11/2020   History of Present Illness  Pt is a 74 y/o female admitted secondary to syncope and positive orthostatics. Pt with recent admission secondary to severe aortic stenosis and underwent cardica cath. PMH includes DM, HTN, and gerd.   Clinical Impression  Pt admitted secondary to problem above with deficits below. Pt requiring min to mod A for bed mobility tasks. Upon sitting, pt with increased dizziness and wooziness. BP at 99/59 mmHG. Further mobility deferred. Pt currently lives alone and feel she is at increased risk for falls. Currently feel pt will benefit from SNF level therapies, however, may progress to be able to d/c home with HHPT. Will continue to follow acutely to maximize functional mobility independence and safety.     Follow Up Recommendations SNF(vs HHPT pending progression)    Equipment Recommendations  Other (comment)(TBD)    Recommendations for Other Services       Precautions / Restrictions Precautions Precautions: Fall;Other (comment) Precaution Comments: watch BP  Restrictions Weight Bearing Restrictions: No      Mobility  Bed Mobility Overal bed mobility: Needs Assistance Bed Mobility: Sit to Supine;Supine to Sit     Supine to sit: Min assist;Mod assist Sit to supine: Mod assist   General bed mobility comments: Min to mod A for trunk elevation and assist to scoot hips to EOB. Pt feeling woozy and BP down to 99/59 mmHg. Further mobility deferred. Mod A for LE assist for return to supine.   Transfers                    Ambulation/Gait                Stairs            Wheelchair Mobility    Modified Rankin (Stroke Patients Only)       Balance Overall balance assessment: Needs assistance Sitting-balance support: No upper extremity supported;Feet supported Sitting balance-Leahy Scale: Fair                                        Pertinent Vitals/Pain Pain Assessment: 0-10 Pain Score: 5  Pain Location: L knee, hands, bottom  Pain Descriptors / Indicators: Aching Pain Intervention(s): Limited activity within patient's tolerance;Monitored during session;Repositioned    Home Living Family/patient expects to be discharged to:: Private residence Living Arrangements: Alone Available Help at Discharge: Family(sister in law) Type of Home: House Home Access: Ramped entrance     Home Layout: One level Home Equipment: Environmental consultant - 2 wheels;Cane - single point      Prior Function Level of Independence: Independent with assistive device(s)         Comments: USes RW vs cane depending on the day      Hand Dominance        Extremity/Trunk Assessment   Upper Extremity Assessment Upper Extremity Assessment: Defer to OT evaluation    Lower Extremity Assessment Lower Extremity Assessment: Generalized weakness    Cervical / Trunk Assessment Cervical / Trunk Assessment: Normal  Communication   Communication: No difficulties  Cognition Arousal/Alertness: Awake/alert Behavior During Therapy: WFL for tasks assessed/performed Overall Cognitive Status: Within Functional Limits for tasks assessed  General Comments      Exercises     Assessment/Plan    PT Assessment Patient needs continued PT services  PT Problem List Cardiopulmonary status limiting activity;Decreased strength;Decreased balance;Decreased mobility;Decreased activity tolerance       PT Treatment Interventions Gait training;DME instruction;Functional mobility training;Therapeutic exercise;Therapeutic activities;Balance training;Patient/family education    PT Goals (Current goals can be found in the Care Plan section)  Acute Rehab PT Goals Patient Stated Goal: to figure out what is going on  PT Goal Formulation: With patient Time For Goal  Achievement: 05/25/20 Potential to Achieve Goals: Good    Frequency Min 3X/week   Barriers to discharge Decreased caregiver support      Co-evaluation               AM-PAC PT "6 Clicks" Mobility  Outcome Measure Help needed turning from your back to your side while in a flat bed without using bedrails?: A Little Help needed moving from lying on your back to sitting on the side of a flat bed without using bedrails?: A Lot Help needed moving to and from a bed to a chair (including a wheelchair)?: A Lot Help needed standing up from a chair using your arms (e.g., wheelchair or bedside chair)?: A Lot Help needed to walk in hospital room?: A Lot Help needed climbing 3-5 steps with a railing? : A Lot 6 Click Score: 13    End of Session   Activity Tolerance: Treatment limited secondary to medical complications (Comment)(low BP ) Patient left: in bed;with call bell/phone within reach;with bed alarm set Nurse Communication: Mobility status PT Visit Diagnosis: Unsteadiness on feet (R26.81);Muscle weakness (generalized) (M62.81);History of falling (Z91.81)    Time: 5277-8242 PT Time Calculation (min) (ACUTE ONLY): 29 min   Charges:   PT Evaluation $PT Eval Moderate Complexity: 1 Mod PT Treatments $Therapeutic Activity: 8-22 mins        [name], DPT  Acute Rehabilitation Services  Pager: [phone] Office: [phone]   [name] 05/11/2020, 3:54 PM

## 2020-05-11 NOTE — ED Notes (Signed)
lunch tray ordered

## 2020-05-11 NOTE — Consult Note (Addendum)
[facility]   MULTIDISCIPLINARY HEART VALVE TEAM  Date:  05/11/2020   ID:  Iriel Nason, DOB [birth year], MRN [id number]  PCP:  Myrlene Broker, MD   Chief Complaint  Patient presents with  . Fall  . Loss of Consciousness     HISTORY OF PRESENT ILLNESS: [name] is a 74 y.o. female who presents for evaluation of severe aortic stenosis, referred by Dr Clayborne Dana (ED Physician)  The patient is a 74 year old woman with hypertension, hyperlipidemia, uncontrolled diabetes, and chronic lower extremity edema, who was recently hospitalized May 28 through May 10, 2020 with chest pain and mildly elevated troponin felt to be consistent with non-STEMI ACS.  During that hospitalization she underwent echo and cardiac catheterization studies.  Echo images were very difficult with poor acoustic windows.  Overall LV function appeared normal with normal LVEF.  The aortic valve was difficult to visualize but appeared calcified and restricted.  Peak and mean transvalvular gradients were measured at 48 and 32 mmHg, respectively with a peak transaortic velocity of 3.5 m/s, dimensionless index of 0.22, and aortic valve area ranging from 0.62-0.77.  Cardiac catheterization revealed patent coronary arteries with no obstructive CAD.  There were diffuse irregularities throughout the coronaries noted.  Invasive hemodynamic showed peak and mean transaortic gradients of 42 and 33 mmHg, respectively and a calculated aortic valve area of 0.76 cm.  The patient is now presenting to the emergency department with lightheadedness and frank syncope.  She is noted to have positive orthostatic vital signs and is dizzy even with standing upright.  She reports that after she left the hospital she has continued to feel dizzy when going from sitting to standing.  She had just walked in from her mailbox when she lost consciousness in the kitchen.  She denies any significant trauma or injury.  She states  that the pressure in her chest has now resolved and she denies any chest pain at rest or with activity.  She is not dyspneic at rest but does report exertional shortness of breath.  The edema in her legs is much better than it has been in a long time.  The patient is single and she lives in the [place] area of [place].  She has a fairly limited social support structure.  She describes herself as somewhat reclusive.  She does not drive an automobile.  Her former sister-in-law provides most of the support and she drives her to the grocery store.  The patient states that she shops with an electric cart since she is not able to do a lot of walking.  The patient has not had regular dental care and reports poor dentition.  Past Medical History:  Diagnosis Date  . Achilles tendinitis   . Acute renal failure (ARF) (HCC) 12/19/2014  . Calcaneal spur    right  . Cataract   . Depression   . Diabetes mellitus    type II  . Diverticulosis of colon (without mention of hemorrhage) 2013  . GERD (gastroesophageal reflux disease)   . GI bleed 03/26/14  . Hyperlipidemia   . Hypertension   . Internal hemorrhoids 2013  . Peripheral neuropathy   . Right shoulder pain    Subacromial tendinitis  . Venous insufficiency   . Ventral hernia     Current Facility-Administered Medications  Medication Dose Route Frequency Provider Last Rate Last Admin  . 0.9 %  sodium chloride infusion   Intravenous Continuous Clydie Braun, MD      .  acetaminophen (TYLENOL) tablet 650 mg  650 mg Oral Q6H PRN Norval Morton, MD       Or  . acetaminophen (TYLENOL) suppository 650 mg  650 mg Rectal Q6H PRN Norval Morton, MD      . aspirin EC tablet 81 mg  81 mg Oral Daily Smith, Rondell A, MD      . atorvastatin (LIPITOR) tablet 40 mg  40 mg Oral Daily Smith, Rondell A, MD      . cefTRIAXone (ROCEPHIN) 1 g in sodium chloride 0.9 % 100 mL IVPB  1 g Intravenous Q24H Smith, Rondell A, MD      . citalopram (CELEXA)  tablet 20 mg  20 mg Oral Daily Smith, Rondell A, MD      . enoxaparin (LOVENOX) injection 40 mg  40 mg Subcutaneous Q24H Smith, Rondell A, MD      . ezetimibe (ZETIA) tablet 10 mg  10 mg Oral Daily Smith, Rondell A, MD      . gabapentin (NEURONTIN) capsule 300 mg  300 mg Oral TID Tamala Julian, Rondell A, MD      . insulin aspart protamine- aspart (NOVOLOG MIX 70/30) injection 45 Units  45 Units Subcutaneous BID WC Smith, Rondell A, MD      . metoprolol tartrate (LOPRESSOR) tablet 12.5 mg  12.5 mg Oral BID Smith, Rondell A, MD      . naphazoline-glycerin (CLEAR EYES REDNESS) ophth solution 1-2 drop  1-2 drop Both Eyes QID PRN Smith, Rondell A, MD      . pantoprazole (PROTONIX) EC tablet 40 mg  40 mg Oral Daily Smith, Rondell A, MD      . sodium chloride flush (NS) 0.9 % injection 3 mL  3 mL Intravenous Q12H Norval Morton, MD       Current Outpatient Medications  Medication Sig Dispense Refill  . acetaminophen (TYLENOL) 325 MG tablet Take 325 mg by mouth daily as needed for mild pain or headache.    Marland Kitchen aspirin 81 MG tablet Take 81 mg by mouth daily.    Marland Kitchen atorvastatin (LIPITOR) 40 MG tablet Take 1 tablet (40 mg total) by mouth daily. 90 tablet 3  . citalopram (CELEXA) 20 MG tablet Take 1 tablet (20 mg total) by mouth daily. 90 tablet 1  . esomeprazole (NEXIUM) 20 MG capsule Take 1 capsule (20 mg total) by mouth daily at 12 noon. 90 capsule 3  . ezetimibe (ZETIA) 10 MG tablet Take 1 tablet (10 mg total) by mouth daily. 90 tablet 3  . furosemide (LASIX) 40 MG tablet Take 1 tablet (40 mg total) by mouth daily. 90 tablet 3  . gabapentin (NEURONTIN) 300 MG capsule Take 1 capsule (300 mg total) by mouth 3 (three) times daily. 90 capsule 0  . Insulin Lispro Prot & Lispro (HUMALOG MIX 75/25 KWIKPEN) (75-25) 100 UNIT/ML Kwikpen Inject 44 Units into the skin 2 (two) times daily. (Patient taking differently: Inject 45 Units into the skin 2 (two) times daily. ) 30 mL 6  . lisinopril (ZESTRIL) 20 MG tablet Take 1  tablet (20 mg total) by mouth daily. 90 tablet 1  . metFORMIN (GLUCOPHAGE) 1000 MG tablet Take 0.5 tablets (500 mg total) by mouth 2 (two) times daily with a meal. 90 tablet 3  . metoprolol tartrate (LOPRESSOR) 25 MG tablet Take 0.5 tablets (12.5 mg total) by mouth 2 (two) times daily. 30 tablet 2  . sodium chloride (OCEAN) 0.65 % SOLN nasal spray Place 1 spray into both nostrils  4 (four) times daily. (Patient taking differently: Place 1 spray into both nostrils as needed for congestion. ) 30 mL 2  . tetrahydrozoline 0.05 % ophthalmic solution Place 1-2 drops into both eyes 2 (two) times daily as needed (Dry eyes).    Terrance Mass Salicylate (ASPERCREME EX) Apply 1 application topically daily as needed (shoulder/hips/knee/knuckles).    . Continuous Blood Gluc Sensor (FREESTYLE LIBRE SENSOR SYSTEM) MISC Use to monitor sugars 1 each 0  . glucose blood (ONETOUCH VERIO) test strip Use as instructed 150 each 12  . Insulin Pen Needle 31G X 8 MM MISC Use to inject insulin four times daily E11.41 100 each 3    ALLERGIES:   Morphine, Codeine sulfate, Demerol [meperidine], Meperidine hcl, and Propoxyphene hcl   SOCIAL HISTORY:  The patient  reports that she has never smoked. She has never used smokeless tobacco. She reports that she does not drink alcohol or use drugs.   FAMILY HISTORY:  The patient's family history includes Breast cancer in her paternal aunt; CVA in her father; Diabetes in her paternal grandmother and sister; Heart failure in her father and mother; Other in her brother and sister; Ovarian cancer in her sister.   REVIEW OF SYSTEMS:  Positive for fatigue, shortness of breath, generalized weakness, lightheadedness/dizziness.   All other systems are reviewed and negative.   PHYSICAL EXAM: VS:  BP (!) 109/58   Pulse 74   Resp 18   SpO2 97%  , BMI There is no height or weight on file to calculate BMI. GEN: Well nourished, well developed, in no acute distress HEENT: normal except for missing  teeth/poor dentition Neck: No JVD. carotids 2+ with left greater than right carotid bruit Cardiac: The heart is RRR a 3/6 mid peaking harsh systolic murmur at the right upper sternal border, A2 is present, no diastolic murmur.  No edema. Pedal pulses 2+ = bilaterally  Respiratory:  clear to auscultation bilaterally GI: soft, nontender, nondistended, + BS MS: no deformity or atrophy Skin: warm and dry, no rash Neuro:  Strength and sensation are intact Psych: euthymic mood, full affect  EKG:  EKG from today reviewed and demonstrates normal sinus rhythm with left axis deviation, otherwise normal  RECENT LABS: 05/09/2020: Magnesium 1.7 05/10/2020: ALT 46; BUN 33; Creatinine, Ser 1.14; Hemoglobin 14.8; Platelets 219; Potassium 4.5; Sodium 134 05/11/2020: B Natriuretic Peptide 72.9  03/20/2020: Cholesterol 194; HDL 36.30; LDL Cholesterol 124; Total CHOL/HDL Ratio 5; Triglycerides 170.0; VLDL 34.0   Estimated Creatinine Clearance: 45.7 mL/min (A) (by C-G formula based on SCr of 1.14 mg/dL (H)).   Wt Readings from Last 3 Encounters:  05/08/20 82.6 kg  03/27/20 88.9 kg  03/20/20 87.5 kg     CARDIAC STUDIES: 2D echocardiogram 05/06/2020: FINDINGS  Left Ventricle: Left ventricular ejection fraction, by estimation, is 55  to 60%. The left ventricle has normal function. The left ventricle has no  regional wall motion abnormalities. Definity contrast agent was given IV  to delineate the left ventricular  endocardial borders. The left ventricular internal cavity size was normal  in size. There is mild concentric left ventricular hypertrophy. Left  ventricular diastolic function could not be evaluated due to nondiagnostic  images. Left ventricular diastolic  function could not be evaluated.   Right Ventricle: The right ventricular size is normal. No increase in  right ventricular wall thickness. Right ventricular systolic function is  normal. Tricuspid regurgitation signal is inadequate for  assessing PA  pressure.   Left Atrium: Left atrial size  was normal in size.   Right Atrium: Right atrial size was normal in size.   Pericardium: Trivial pericardial effusion is present. Presence of  pericardial fat pad.   Mitral Valve: The mitral valve is grossly normal. Mild mitral annular  calcification. No evidence of mitral valve regurgitation. No evidence of  mitral valve stenosis.   Tricuspid Valve: The tricuspid valve is grossly normal. Tricuspid valve  regurgitation is not demonstrated. No evidence of tricuspid stenosis.   Aortic Valve: Unable to determine valve morphology due to image quality..  There is moderate thickening and moderate calcification of the aortic  valve. Aortic valve regurgitation is not visualized. Moderate aortic  stenosis is present. There is moderate  thickening of the aortic valve. There is moderate calcification of the  aortic valve. Aortic valve mean gradient measures 32.0 mmHg. Aortic valve  peak gradient measures 48.2 mmHg. Aortic valve area, by VTI measures 0.74  cm.   Pulmonic Valve: The pulmonic valve was grossly normal. Pulmonic valve  regurgitation is not visualized. No evidence of pulmonic stenosis.   Aorta: The aortic root and ascending aorta are structurally normal, with  no evidence of dilitation.   Venous: The inferior vena cava is normal in size with greater than 50%  respiratory variability, suggesting right atrial pressure of 3 mmHg.   IAS/Shunts: The atrial septum is grossly normal.     LEFT VENTRICLE  PLAX 2D  LVIDd:     3.80 cm Diastology  LVIDs:     2.30 cm LV e' lateral:  8.38 cm/s  LV PW:     1.00 cm LV E/e' lateral: 10.6  LV IVS:    1.20 cm LV e' medial:  4.79 cm/s  LVOT diam:   1.90 cm LV E/e' medial: 18.6  LV SV:     51  LV SV Index:  27  LVOT Area:   2.84 cm     RIGHT VENTRICLE  RV S prime:   9.57 cm/s  TAPSE (M-mode): 2.0 cm   LEFT ATRIUM       Index     RIGHT ATRIUM      Index  LA diam:    3.60 cm 1.89 cm/m RA Area:   11.00 cm  LA Vol (A2C):  62.9 ml 32.99 ml/m RA Volume:  24.50 ml 12.85 ml/m  LA Vol (A4C):  52.5 ml 27.53 ml/m  LA Biplane Vol: 58.8 ml 30.84 ml/m  AORTIC VALVE  AV Area (Vmax):  0.62 cm  AV Area (Vmean):  0.59 cm  AV Area (VTI):   0.74 cm  AV Vmax:      347.00 cm/s  AV Vmean:     264.000 cm/s  AV VTI:      0.690 m  AV Peak Grad:   48.2 mmHg  AV Mean Grad:   32.0 mmHg  LVOT Vmax:     75.40 cm/s  LVOT Vmean:    55.300 cm/s  LVOT VTI:     0.179 m  LVOT/AV VTI ratio: 0.26    AORTA  Ao Root diam: 2.60 cm  Ao Asc diam: 3.00 cm   MITRAL VALVE  MV Area (PHT): 3.19 cm  SHUNTS  MV Decel Time: 238 msec  Systemic VTI: 0.18 m  MV E velocity: 89.10 cm/s Systemic Diam: 1.90 cm  MV A velocity: 90.40 cm/s  MV E/A ratio: 0.99   Cardiac catheterization 05/09/2020: Conclusion    1st Mrg lesion is 25% stenosed.  2nd Mrg lesion is 25% stenosed.  The  left ventricular systolic function is normal.  LV end diastolic pressure is normal.  The left ventricular ejection fraction is 55-65% by visual estimate.  There is severe aortic valve stenosis. Calculated aortic valve area 0.76 cm.  Aortic saturation 99%, PA saturation 68%, mean PA pressure 12 mmHg, mean PCWP 6 mmHg. Cardiac output 4.0 L/min. Cardiac index 2.16.   Nonobstructive coronary artery disease.  Severe aortic stenosis.  We will forward to Dr. Clifton James to decide on plans for TAVR.  I was unable to reach her sister, Stann Mainland.  No voicemail option was available.    Coronary Findings  Diagnostic Dominance: Right Left Anterior Descending  There is mild diffuse disease throughout the vessel.  Left Circumflex  The vessel exhibits minimal luminal irregularities.  First Obtuse Marginal Branch  1st Mrg lesion 25% stenosed  1st Mrg lesion is 25% stenosed.  Second Obtuse Marginal  Branch  2nd Mrg lesion 25% stenosed  2nd Mrg lesion is 25% stenosed.  Right Coronary Artery  The vessel exhibits minimal luminal irregularities.   Left Heart  Left Ventricle The left ventricular size is normal. The left ventricular systolic function is normal. LV end diastolic pressure is normal. The left ventricular ejection fraction is 55-65% by visual estimate. No regional wall motion abnormalities.  Aortic Valve There is severe aortic valve stenosis.  Coronary Diagrams  Diagnostic Dominance: Right  Intervention  Implants   No implant documentation for this case.  Syngo Images  Show images for CARDIAC CATHETERIZATION  Images on Long Term Storage  Show images for [place]iy[place], [place] to Procedure Log  Procedure Log    Hemo Data   Most Recent Value  Fick Cardiac Output 4.07 L/min  Fick Cardiac Output Index 2.16 (L/min)/BSA  Aortic Mean Gradient 33.15 mmHg  Aortic Peak Gradient 42 mmHg  Aortic Valve Area 0.76  Aortic Value Area Index 0.41 cm2/BSA  RA A Wave 3 mmHg  RA V Wave 2 mmHg  RA Mean 1 mmHg  RV Systolic Pressure 23 mmHg  RV Diastolic Pressure 0 mmHg  RV EDP 2 mmHg  PA Systolic Pressure 21 mmHg  PA Diastolic Pressure 7 mmHg  PA Mean 12 mmHg  PW A Wave 4 mmHg  PW V Wave 8 mmHg  PW Mean 6 mmHg  AO Systolic Pressure 129 mmHg  AO Diastolic Pressure 58 mmHg  AO Mean 86 mmHg  LV Systolic Pressure 128 mmHg  LV Diastolic Pressure 1 mmHg  LV EDP 5 mmHg  AOp Systolic Pressure 93 mmHg  AOp Diastolic Pressure 45 mmHg  AOp Mean Pressure 63 mmHg  LVp Systolic Pressure 135 mmHg  LVp Diastolic Pressure 0 mmHg  LVp EDP Pressure 5 mmHg  QP/QS 1  TPVR Index 5.55 HRUI  TSVR Index 32.86 HRUI  PVR SVR Ratio 0.09  TPVR/TSVR Ratio 0.17     STS RISK CALCULATOR: Isolated AVR: Risk of Mortality: 2.038% Renal Failure: 1.204% Permanent Stroke: 1.768% Prolonged Ventilation: 7.453% DSW Infection: 0.158% Reoperation: 2.741% Morbidity or  Mortality: 12.230% Short Length of Stay: 33.856% Long Length of Stay: 5.652% _______________________  [facility] Cardiomyopathy Questionnaire  KCCQ-12 05/12/2020  1 a. Ability to shower/bathe Slightly limited  1 b. Ability to walk 1 block Extremely limited  1 c. Ability to hurry/jog Other, Did not do  2. Edema feet/ankles/legs Every morning  3. Limited by fatigue All of the time  4. Limited by dyspnea All of the time  5. Sitting up / on 3+ pillows Every night  6.  Limited enjoyment of life Extremely limited  7. Rest of life w/ symptoms Not at all satisfied  8 a. Participation in hobbies Limited quite a bit  8 b. Participation in chores Limited quite a bit  8 c. Visiting family/friends N/A, did not do for other reasons      ASSESSMENT AND PLAN: 1.  Severe, stage D1 aortic stenosis 2.  Syncope with documented orthostasis 3.  Uncontrolled diabetes with most recent hemoglobin A1c of 11.2 4.  Hypertension  I have reviewed the natural history of aortic stenosis with the patient today. We have discussed the limitations of medical therapy and the poor prognosis associated with symptomatic aortic stenosis. We have reviewed potential treatment options, including palliative medical therapy, conventional surgical aortic valve replacement, and transcatheter aortic valve replacement. We discussed treatment options in the context of the patient's specific comorbid medical conditions.    The patient's echo and cardiac catheterization studies are personally reviewed.  Echocardiogram findings are outlined in the history of present illness.  The imaging is difficult secondary to poor acoustic windows.  The aortic valve does appear to be calcified and restricted, unable to see the leaflets well enough to determine if it is a tricuspid or bicuspid valve.  Findings are consistent with severe aortic stenosis as outlined.  Cardiac catheterization images are also reviewed and demonstrate calcification with  moderate leaflet restriction seen on plain fluoroscopy.  Coronary arteries demonstrate diffuse plaquing but no significant stenoses are seen.  Invasive hemodynamics also confirmed severe aortic stenosis with peak and mean gradients of 42 and 33mmHg respectively and a calculated valve area of 0.76.  The patient now presents with orthostatic hypotension and syncope.  While I am sure her aortic stenosis is contributing, I suspect she is intravascularly volume deplete.  Right heart catheterization data showed low right heart pressures and even a low LVEDP.  I agree with the plans documented by the hospitalist team, which includes holding lisinopril and furosemide, gentle volume resuscitation, and follow-up of labs/orthostatic vital signs.  The patient will ultimately need further evaluation for treatment of her aortic stenosis.  This will include CT angiogram studies of the heart as well as the chest, abdomen, and pelvis.  It is probably best to defer this for now as she demonstrates findings consistent with acute kidney injury, creatinine increased from 0.75 to 1.14 mg/dL and evidence of volume depletion as outlined.  She just had a CT scan of the chest with PE protocol earlier today.  We will follow creatinine tomorrow morning after holding her ACE inhibitor and diuretic and giving her gentle fluids.  Will determine when to move forward with her CTA studies based on her clinical status and renal function.  She will require formal cardiac surgical consultation after all of her studies are completed.  She does not appear to have critical aortic stenosis and I am hopeful that she will respond to conservative measures outlined above and then she could ultimately undergo aortic valve replacement electively as an outpatient.  Enzo BiSigned, Michael Cooper 05/11/2020 11:33 AM     [facility] [address] [place] [address]  [phone] (office) [phone] (fax)

## 2020-05-11 NOTE — H&P (Signed)
History and Physical    [name] ZOX:[id number] DOB: 07/05/1946 DOA: 05/10/2020  Referring MD/NP/PA: Mitzi Hansen, MD PCP: Hoyt Koch, MD  Patient coming from: Home via EMS  Chief Complaint: Fall  I have personally briefly reviewed patient's old medical records in [place]   HPI: [name] is a 74 y.o. female with medical history significant of hypertension, hyperlipidemia, CAD, severe aortic stenosis, diabetes mellitus type 2, and chronic back pain who presents after having a fall with loss of consciousness.  She had just been recently hospitalized from 5/28-6/2 with complaints of chest pain with NSTEMI.  She underwent cardiac cath which showed nonobstructive disease.  Since leaving the hospital patient reports that she has been constantly dizzy.  She is woken up on the floor at least 3-4 times.  Denies having any chest pain or shortness of breath.  She had been nauseated 3 to 4 days prior to coming into the hospital initially on 5/28, but reports that those symptoms have now resolved.  She has lower extremity swelling, but states that her legs are skinny as compared to what they used to be.  The only other thing that patient notices that she has had some burning with urinating and chronically has a cough that she relates to postnasal drainage.   ED Course: Upon admission to the emergency department patient was seen to be afebrile with respirations 12-24, blood pressures 98/56-145/72, and O2 saturations currently maintained on room air.  CT angiogram of the head and neck showed chronic changes and no acute abnormalities.  Labs significant for WBC 15, sodium 134, BUN 33, creatinine 1.14, glucose 261, alkaline phosphatase 179, AST 44, ALT 46, and BNP 72.9.  CT angiogram of the chest showed no signs of a PE was clear of any acute abnormalities.  X-rays of the knee and pelvis revealed a trace effusion and advanced degenerative changes.  Urinalysis was  significant for large leukocytes, few bacteria, 6-10 epithelial cells, and greater than 50 WBCs.  Patient has been fentanyl 25 mcg.  Cardiology has been formally consulted and [facility] called to admit.  Due to poor dentition dental x-ray was ordered and later revealed multiple missing teeth with dental caries..  Review of Systems  Constitutional: Negative for fever.  HENT: Positive for congestion. Negative for hearing loss.   Eyes: Negative for photophobia and discharge.  Respiratory: Positive for cough. Negative for shortness of breath and wheezing.   Cardiovascular: Positive for leg swelling. Negative for chest pain.  Gastrointestinal: Negative for abdominal pain, nausea and vomiting.  Genitourinary: Positive for dysuria. Negative for flank pain.  Musculoskeletal: Positive for falls.  Skin: Negative for itching.  Neurological: Positive for dizziness and loss of consciousness.  Psychiatric/Behavioral: Negative for substance abuse.    Past Medical History:  Diagnosis Date  . Achilles tendinitis   . Acute renal failure (ARF) ([place]) 12/19/2014  . Calcaneal spur    right  . Cataract   . Depression   . Diabetes mellitus    type II  . Diverticulosis of colon (without mention of hemorrhage) 2013  . GERD (gastroesophageal reflux disease)   . GI bleed 03/26/14  . Hyperlipidemia   . Hypertension   . Internal hemorrhoids 2013  . Peripheral neuropathy   . Right shoulder pain    Subacromial tendinitis  . Venous insufficiency   . Ventral hernia     Past Surgical History:  Procedure Laterality Date  . ABDOMINAL HYSTERECTOMY  2001   TAH-BSO  .  CHOLECYSTECTOMY  2001  . COLONOSCOPY  2013   diverticulosis   . ESOPHAGOGASTRODUODENOSCOPY  2013   normal   . INCISIONAL HERNIA REPAIR    . RIGHT/LEFT HEART CATH AND CORONARY ANGIOGRAPHY N/A 05/09/2020   Procedure: RIGHT/LEFT HEART CATH AND CORONARY ANGIOGRAPHY;  Surgeon: Corky Crafts, MD;  Location: [facility] INVASIVE CV LAB;  Service:  Cardiovascular;  Laterality: N/A;  . TONSILLECTOMY       reports that she has never smoked. She has never used smokeless tobacco. She reports that she does not drink alcohol or use drugs.  Allergies  Allergen Reactions  . Morphine Other (See Comments)    'took me out of this world' I had to be resuscitated  . Codeine Sulfate Other (See Comments)    GI upset and pain  . Demerol [Meperidine] Nausea And Vomiting  . Meperidine Hcl Nausea And Vomiting and Swelling  . Propoxyphene Hcl Other (See Comments)    Darvocet caused sick headache    Family History  Problem Relation Age of Onset  . Heart failure Father        Died in 95s  . CVA Father   . Ovarian cancer Sister   . Diabetes Sister   . Other Sister        died at birth  . Heart failure Mother        Died in 82s  . Other Brother        drowned  . Diabetes Paternal Grandmother   . Breast cancer Paternal Aunt   . Colon cancer Neg Hx     Prior to Admission medications   Medication Sig Start Date End Date Taking? Authorizing Provider  acetaminophen (TYLENOL) 325 MG tablet Take 325 mg by mouth daily as needed for mild pain or headache.   Yes [provider]  aspirin 81 MG tablet Take 81 mg by mouth daily.   Yes [provider]  atorvastatin (LIPITOR) 40 MG tablet Take 1 tablet (40 mg total) by mouth daily. 03/27/20 06/25/20 Yes Marjie Skiff E, PA-C  citalopram (CELEXA) 20 MG tablet Take 1 tablet (20 mg total) by mouth daily. 03/07/20  Yes Myrlene Broker, MD  esomeprazole (NEXIUM) 20 MG capsule Take 1 capsule (20 mg total) by mouth daily at 12 noon. 03/07/20  Yes Myrlene Broker, MD  ezetimibe (ZETIA) 10 MG tablet Take 1 tablet (10 mg total) by mouth daily. 03/20/20  Yes Myrlene Broker, MD  furosemide (LASIX) 40 MG tablet Take 1 tablet (40 mg total) by mouth daily. 03/07/20  Yes Myrlene Broker, MD  gabapentin (NEURONTIN) 300 MG capsule Take 1 capsule (300 mg total) by mouth 3 (three)  times daily. 03/07/20  Yes Myrlene Broker, MD  Insulin Lispro Prot & Lispro (HUMALOG MIX 75/25 KWIKPEN) (75-25) 100 UNIT/ML Kwikpen Inject 44 Units into the skin 2 (two) times daily. Patient taking differently: Inject 45 Units into the skin 2 (two) times daily.  08/30/19  Yes Shamleffer, Konrad Dolores, MD  lisinopril (ZESTRIL) 20 MG tablet Take 1 tablet (20 mg total) by mouth daily. 03/07/20  Yes Myrlene Broker, MD  metFORMIN (GLUCOPHAGE) 1000 MG tablet Take 0.5 tablets (500 mg total) by mouth 2 (two) times daily with a meal. 03/07/20  Yes Myrlene Broker, MD  metoprolol tartrate (LOPRESSOR) 25 MG tablet Take 0.5 tablets (12.5 mg total) by mouth 2 (two) times daily. 05/09/20  Yes Laverda Page B, NP  sodium chloride (OCEAN) 0.65 % SOLN nasal spray Place  1 spray into both nostrils 4 (four) times daily. Patient taking differently: Place 1 spray into both nostrils as needed for congestion.  03/07/20  Yes Myrlene Brokerrawford, [name], MD  tetrahydrozoline 0.05 % ophthalmic solution Place 1-2 drops into both eyes 2 (two) times daily as needed (Dry eyes).   Yes [provider]  Trolamine Salicylate (ASPERCREME EX) Apply 1 application topically daily as needed (shoulder/hips/knee/knuckles).   Yes [provider]  Continuous Blood Gluc Sensor (FREESTYLE LIBRE SENSOR SYSTEM) MISC Use to monitor sugars 03/20/20   Myrlene Brokerrawford, [name], MD  glucose blood [facility](ONETOUCH VERIO) test strip Use as instructed 03/07/20   Myrlene Brokerrawford, [name], MD  Insulin Pen Needle 31G X 8 MM MISC Use to inject insulin four times daily E11.41 04/06/15   Myrlene Brokerrawford, [name], MD    Physical Exam:  Constitutional: NAD, calm, comfortable Vitals:   05/11/20 0615 05/11/20 0630 05/11/20 0700 05/11/20 0715  BP: (!) 98/56 (!) 106/52 (!) 106/55 (!) 109/58  Pulse: 72 72 78 74  Resp: 18 18 19 18   SpO2: 96% 97% 97% 97%   Eyes: PERRL, lids and conjunctivae normal ENMT: Mucous membranes are moist. Posterior pharynx  clear of any exudate or lesions.Normal dentition.  Neck: normal, supple, no masses, no thyromegaly Respiratory: clear to auscultation bilaterally, no wheezing, no crackles. Normal respiratory effort. No accessory muscle use.  Cardiovascular: Regular rate and rhythm with positive systolic ejection murmur 3/6.  Trace bilateral lower extremity edema. 2+ pedal pulses. No carotid bruits.  Abdomen: no tenderness, no masses palpated. No hepatosplenomegaly. Bowel sounds positive.  Musculoskeletal: no clubbing / cyanosis. No joint deformity upper and lower extremities. Good ROM, no contractures. Normal muscle tone.  Skin: no rashes, lesions, ulcers. No induration Neurologic: CN 2-12 grossly intact. Sensation intact, DTR normal. Strength 5/5 in all 4.  Psychiatric: Normal judgment and insight. Alert and oriented x 3. Normal mood.     Labs on Admission: I have personally reviewed following labs and imaging studies  CBC: Recent Labs  Lab 05/07/20 0218 05/07/20 0218 05/08/20 0236 05/08/20 0236 05/09/20 0254 05/09/20 [id number] 05/09/20 0925 05/10/20 0154 05/10/20 2359  WBC 9.0  --  10.4  --  10.0  --   --  9.2 15.0*  NEUTROABS  --   --   --   --   --   --   --   --  11.1*  HGB 13.6   < > 14.2   < > 14.6 14.6 13.9 14.7 14.8  HCT 42.0   < > 43.5   < > 44.8 43.0 41.0 45.5 46.3*  MCV 83.7  --  84.1  --  83.7  --   --  83.8 84.8  PLT 209  --  229  --  215  --   --  218 219   < > = values in this interval not displayed.   Basic Metabolic Panel: Recent Labs  Lab 05/05/20 1446 05/05/20 1718 05/06/20 0750 05/06/20 0750 05/07/20 0218 05/07/20 0218 05/08/20 0236 05/09/20 [id number] 05/09/20 0735 05/09/20 0907 05/09/20 0925 05/10/20 2359  NA   < >  --  136   < > 135   < > 137 138  --  139 138 134*  K   < >  --  4.3   < > 3.7   < > 4.9 3.1*  --  3.5 3.4* 4.5  CL   < >  --  98  --  96*  --  96* 98  --   --   --  95*  CO2   < >  --  29  --  28  --  33* 30  --   --   --  29  GLUCOSE   < >  --  258*  --   257*  --  215* 135*  --   --   --  261*  BUN   < >  --  16  --  21  --  21 17  --   --   --  33*  CREATININE   < >  --  0.77  --  0.85  --  1.10* 0.75  --   --   --  1.14*  CALCIUM   < >  --  8.6*  --  8.4*  --  8.6* 8.5*  --   --   --  8.9  MG  --  1.8  --   --   --   --   --   --  1.7  --   --   --    < > = values in this interval not displayed.   GFR: Estimated Creatinine Clearance: 45.7 mL/min (A) (by C-G formula based on SCr of 1.14 mg/dL (H)). Liver Function Tests: Recent Labs  Lab 05/10/20 2359  AST 44*  ALT 46*  ALKPHOS 179*  BILITOT 0.5  PROT 7.4  ALBUMIN 3.2*   No results for input(s): LIPASE, AMYLASE in the last 168 hours. No results for input(s): AMMONIA in the last 168 hours. Coagulation Profile: No results for input(s): INR, PROTIME in the last 168 hours. Cardiac Enzymes: No results for input(s): CKTOTAL, CKMB, CKMBINDEX, TROPONINI in the last 168 hours. BNP (last 3 results) No results for input(s): PROBNP in the last 8760 hours. HbA1C: No results for input(s): HGBA1C in the last 72 hours. CBG: Recent Labs  Lab 05/09/20 0615 05/09/20 1139 05/09/20 1613 05/09/20 2058 05/10/20 0555  GLUCAP 125* 117* 169* 230* 155*   Lipid Profile: No results for input(s): CHOL, HDL, LDLCALC, TRIG, CHOLHDL, LDLDIRECT in the last 72 hours. Thyroid Function Tests: No results for input(s): TSH, T4TOTAL, FREET4, T3FREE, THYROIDAB in the last 72 hours. Anemia Panel: No results for input(s): VITAMINB12, FOLATE, FERRITIN, TIBC, IRON, RETICCTPCT in the last 72 hours. Urine analysis:    Component Value Date/Time   COLORURINE YELLOW 05/11/2020 0139   APPEARANCEUR HAZY (A) 05/11/2020 0139   LABSPEC 1.020 05/11/2020 0139   PHURINE 5.0 05/11/2020 0139   GLUCOSEU 150 (A) 05/11/2020 0139   HGBUR SMALL (A) 05/11/2020 0139   BILIRUBINUR NEGATIVE 05/11/2020 0139   BILIRUBINUR Neg 05/10/2019 1029   KETONESUR NEGATIVE 05/11/2020 0139   PROTEINUR 30 (A) 05/11/2020 0139   UROBILINOGEN  0.2 05/10/2019 1029   UROBILINOGEN 0.2 12/19/2014 1425   NITRITE NEGATIVE 05/11/2020 0139   LEUKOCYTESUR LARGE (A) 05/11/2020 0139   Sepsis Labs: Recent Results (from the past 240 hour(s))  MRSA PCR Screening     Status: None   Collection Time: 05/05/20  1:23 AM   Specimen: Nasal Mucosa; Nasopharyngeal  Result Value Ref Range Status   MRSA by PCR NEGATIVE NEGATIVE Final    Comment:        The GeneXpert MRSA Assay (FDA approved for NASAL specimens only), is one component of a comprehensive MRSA colonization surveillance program. It is not intended to diagnose MRSA infection nor to guide or monitor treatment for MRSA infections. Performed at [facility], [address], [place], [place] [address]   SARS  Coronavirus 2 by RT PCR (hospital order, performed in [facility] hospital lab) Nasopharyngeal Nasopharyngeal Swab     Status: None   Collection Time: 05/05/20  5:35 PM   Specimen: Nasopharyngeal Swab  Result Value Ref Range Status   SARS Coronavirus 2 NEGATIVE NEGATIVE Final    Comment: (NOTE) SARS-CoV-2 target nucleic acids are NOT DETECTED. The SARS-CoV-2 RNA is generally detectable in upper and lower respiratory specimens during the acute phase of infection. The lowest concentration of SARS-CoV-2 viral copies this assay can detect is 250 copies / mL. A negative result does not preclude SARS-CoV-2 infection and should not be used as the sole basis for treatment or other patient management decisions.  A negative result may occur with improper specimen collection / handling, submission of specimen other than nasopharyngeal swab, presence of viral mutation(s) within the areas targeted by this assay, and inadequate number of viral copies (<250 copies / mL). A negative result must be combined with clinical observations, patient history, and epidemiological information. Fact Sheet for Patients:   BoilerBrush.com.cy Fact Sheet for Healthcare  Providers: https://pope.com/ This test is not yet approved or cleared  by the [place] FDA and has been authorized for detection and/or diagnosis of SARS-CoV-2 by FDA under an Emergency Use Authorization (EUA).  This EUA will remain in effect (meaning this test can be used) for the duration of the COVID-19 declaration under Section 564(b)(1) of the Act, 21 U.S.C. section 360bbb-3(b)(1), unless the authorization is terminated or revoked sooner. Performed at [facility], [address] [place], [place] [address]   SARS Coronavirus 2 by RT PCR (hospital order, performed in [facility] hospital lab) Nasopharyngeal Nasopharyngeal Swab     Status: None   Collection Time: 05/11/20 12:49 AM   Specimen: Nasopharyngeal Swab  Result Value Ref Range Status   SARS Coronavirus 2 NEGATIVE NEGATIVE Final    Comment: (NOTE) SARS-CoV-2 target nucleic acids are NOT DETECTED. The SARS-CoV-2 RNA is generally detectable in upper and lower respiratory specimens during the acute phase of infection. The lowest concentration of SARS-CoV-2 viral copies this assay can detect is 250 copies / mL. A negative result does not preclude SARS-CoV-2 infection and should not be used as the sole basis for treatment or other patient management decisions.  A negative result may occur with improper specimen collection / handling, submission of specimen other than nasopharyngeal swab, presence of viral mutation(s) within the areas targeted by this assay, and inadequate number of viral copies (<250 copies / mL). A negative result must be combined with clinical observations, patient history, and epidemiological information. Fact Sheet for Patients:   BoilerBrush.com.cy Fact Sheet for Healthcare Providers: https://pope.com/ This test is not yet approved or cleared  by the [place] FDA and has been authorized for detection and/or diagnosis  of SARS-CoV-2 by FDA under an Emergency Use Authorization (EUA).  This EUA will remain in effect (meaning this test can be used) for the duration of the COVID-19 declaration under Section 564(b)(1) of the Act, 21 U.S.C. section 360bbb-3(b)(1), unless the authorization is terminated or revoked sooner. Performed at [facility], [address], [place], [place] [address]      Radiological Exams on Admission: DG Chest 1 View  Result Date: 05/10/2020 CLINICAL DATA:  74 year old female chest pain. EXAM: CHEST  1 VIEW COMPARISON:  Chest radiograph dated 05/05/2020. FINDINGS: No focal consolidation, pleural effusion, pneumothorax. The cardiac silhouette is within normal limits. Coronary vascular calcification. No acute osseous pathology. IMPRESSION: No  focal consolidation. Electronically Signed   By: Elgie Collard M.D.   On: 05/10/2020 23:54   CT Head Wo Contrast  Result Date: 05/10/2020 CLINICAL DATA:  Recent fall EXAM: CT HEAD WITHOUT CONTRAST CT CERVICAL SPINE WITHOUT CONTRAST TECHNIQUE: Multidetector CT imaging of the head and cervical spine was performed following the standard protocol without intravenous contrast. Multiplanar CT image reconstructions of the cervical spine were also generated. COMPARISON:  12/19/2014 FINDINGS: CT HEAD FINDINGS Brain: Mild atrophic changes and chronic white matter ischemic change is seen. No acute hemorrhage, acute infarction or space-occupying mass lesion are noted. Vascular: No hyperdense vessel or unexpected calcification. Skull: Normal. Negative for fracture or focal lesion. Sinuses/Orbits: No acute finding. Other: None. CT CERVICAL SPINE FINDINGS Alignment: Within normal limits. Skull base and vertebrae: 7 cervical segments are well visualized. Vertebral body height is well maintained. Disc space narrowing is noted at C5-6 and C6-7 associated osteophytic changes. Mild facet hypertrophic changes are seen as well at multiple levels. No acute fracture or  acute facet abnormality is noted. Soft tissues and spinal canal: Scattered small hypodensities within the thyroid are noted. These are less than 1 cm in size. Remaining soft tissue structures are within normal limits. Upper chest: Visualized lung apices are within normal limits. Other: None IMPRESSION: CT of the head: Chronic atrophic and ischemic changes without acute abnormality. CT of the cervical spine: Multilevel degenerative change without acute abnormality. Scattered small hypodensities within the thyroid. No followup recommended (ref: J Am Coll Radiol. 2015 Feb;12(2): 143-50). Electronically Signed   By: Alcide Clever M.D.   On: 05/10/2020 23:53   CT Angio Chest PE W/Cm &/Or Wo Cm  Result Date: 05/11/2020 CLINICAL DATA:  Shortness of breath and hypoxia EXAM: CT ANGIOGRAPHY CHEST WITH CONTRAST TECHNIQUE: Multidetector CT imaging of the chest was performed using the standard protocol during bolus administration of intravenous contrast. Multiplanar CT image reconstructions and MIPs were obtained to evaluate the vascular anatomy. CONTRAST:  38mL OMNIPAQUE IOHEXOL 350 MG/ML SOLN COMPARISON:  None. FINDINGS: Cardiovascular: There is a optimal opacification of the pulmonary arteries. There is no central,segmental, or subsegmental filling defects within the pulmonary arteries. The heart is normal in size. No pericardial effusion or thickening. No evidence right heart strain. There is normal three-vessel brachiocephalic anatomy without proximal stenosis. Scattered aortic atherosclerotic calcifications are seen without aneurysmal dilatation. Aortic valve and coronary artery calcifications are seen. Mediastinum/Nodes: No hilar, mediastinal, or axillary adenopathy. There is a partially calcified subcarinal lymph node present. Thyroid gland, trachea, and esophagus demonstrate no significant findings. Lungs/Pleura: Mild bibasilar dependent atelectasis noted. No large airspace consolidation. No pleural effusion or  pneumothorax. No airspace consolidation. Upper Abdomen: No acute abnormalities present in the visualized portions of the upper abdomen. Musculoskeletal: No chest wall abnormality. No acute or significant osseous findings. Review of the MIP images confirms the above findings. IMPRESSION: No central, segmental, or subsegmental pulmonary embolism. No other acute intrathoracic pathology to explain the patient's symptoms. Aortic Atherosclerosis (ICD10-I70.0). Electronically Signed   By: Jonna Clark M.D.   On: 05/11/2020 01:57   CT Cervical Spine Wo Contrast  Result Date: 05/10/2020 CLINICAL DATA:  Recent fall EXAM: CT HEAD WITHOUT CONTRAST CT CERVICAL SPINE WITHOUT CONTRAST TECHNIQUE: Multidetector CT imaging of the head and cervical spine was performed following the standard protocol without intravenous contrast. Multiplanar CT image reconstructions of the cervical spine were also generated. COMPARISON:  12/19/2014 FINDINGS: CT HEAD FINDINGS Brain: Mild atrophic changes and chronic white matter ischemic change is seen. No acute  hemorrhage, acute infarction or space-occupying mass lesion are noted. Vascular: No hyperdense vessel or unexpected calcification. Skull: Normal. Negative for fracture or focal lesion. Sinuses/Orbits: No acute finding. Other: None. CT CERVICAL SPINE FINDINGS Alignment: Within normal limits. Skull base and vertebrae: 7 cervical segments are well visualized. Vertebral body height is well maintained. Disc space narrowing is noted at C5-6 and C6-7 associated osteophytic changes. Mild facet hypertrophic changes are seen as well at multiple levels. No acute fracture or acute facet abnormality is noted. Soft tissues and spinal canal: Scattered small hypodensities within the thyroid are noted. These are less than 1 cm in size. Remaining soft tissue structures are within normal limits. Upper chest: Visualized lung apices are within normal limits. Other: None IMPRESSION: CT of the head: Chronic  atrophic and ischemic changes without acute abnormality. CT of the cervical spine: Multilevel degenerative change without acute abnormality. Scattered small hypodensities within the thyroid. No followup recommended (ref: J Am Coll Radiol. 2015 Feb;12(2): 143-50). Electronically Signed   By: Alcide Clever M.D.   On: 05/10/2020 23:53   DG Knee Complete 4 Views Left  Result Date: 05/11/2020 CLINICAL DATA:  Fall, knee pain EXAM: LEFT KNEE - COMPLETE 4+ VIEW COMPARISON:  None. FINDINGS: Advanced tricompartment degenerative changes with joint space narrowing and spurring. Trace associated joint effusion. No acute bony abnormality. Specifically, no fracture, subluxation, or dislocation. IMPRESSION: Advanced degenerative changes and trace joint effusion. No acute bony abnormality. Electronically Signed   By: Charlett Nose M.D.   On: 05/11/2020 00:01   DG Hip Unilat W or Wo Pelvis 2-3 Views Left  Result Date: 05/10/2020 CLINICAL DATA:  Hip pain, fall EXAM: DG HIP (WITH OR WITHOUT PELVIS) 2-3V LEFT COMPARISON:  None. FINDINGS: Hip joints and SI joints are symmetric and unremarkable. No acute bony abnormality. Specifically, no fracture, subluxation, or dislocation. IMPRESSION: No acute bony abnormality. Electronically Signed   By: Charlett Nose M.D.   On: 05/10/2020 23:58    EKG: Independently reviewed.  Sinus rhythm at 87 bpm  Assessment/Plan Syncope Orthostatic hypotension: Acute.  Patient presents after having multiple syncopal episodes at home.  CTA of the chest did not note any signs of a pulmonary embolus.  Patient denies any complaints of chest pain.  Orthostatic vital signs noted to be positive.  Suspect patient has likely been over diuresed during last hospitalization. -Admit to a medical telemetry bed -Give gentle IV fluids as seen below -Reassess orthostatic vital signs in a.m. -Appreciate cardiology consultative services, we will follow-up further recommendations  Urinary tract infection: Acute.   She reports having dysuria.  UA positive for signs of infection.  -Urine culture -Rocephin IV  Acute kidney injury: Creatinine elevated up to 1.14 with BUN 33 on admission.  Previous baseline seen to be 0.75 on 6/1.  The elevated BUN to creatinine ratio gives concern for prerenal cause of symptoms.  Suspect secondary to overdiuresis possibly. -Gentle IV fluids normal saline at 50 mL/h for total of 500 mL. -Recheck kidney function in a.m.  Aortic stenosis: Cardiology would like to have CT angiogram studies of the heart for better characterization of the patient's aortic stenosis.  As well as of the chest abdomen and pelvis.  However, deferred due to AKI in the acute setting. -Follow-up with cardiology to determine when best to obtain CT angiogram studies as patient ultimately will need outpatient elective aortic valve replacement  Essential hypertension: Home medications include metoprolol 12.5 mg twice daily, lisinopril 20 mg daily, and furosemide 40 mg daily. -Hold  lisinopril and furosemide for at least today due to AKI -Continue metoprolol  Dental caries: Patient noted to have multiple missing teeth with dental caries involving 3 of the residual segment mandibular teeth. -Patient needs referral to dentistry at discharge  Diabetes mellitus type 2: On admission blood sugar elevated to 261.  Home medications include Metformin 500 mg twice daily and Humalog75/25 mix 45 units twice daily -Hypoglycemic protocols -Hold Metformin -Continue home Humalog 75/25 mix dose -CBGs before every meal and at bedtime with sensitive SSI  Dyslipidemia: Home medications include Zetia 10 mg daily and atorvastatin 40 mg daily. -Continue current home regimen  GERD: Home medications include Nexium 40 mg daily. -Continue pharmacy substitution of Protonix  DVT prophylaxis: Lovenox Code Status: Full Family Communication: No family requested to be updated Disposition Plan: Possible discharge home in 1 to 2 days    Consults called: Cardiology Admission status: Inpatient  Clydie Braun MD [facility] Pager [phone]   If 7PM-7AM, please contact night-coverage [email or web address] Password [facility]  05/11/2020, 10:17 AM

## 2020-05-12 ENCOUNTER — Inpatient Hospital Stay (HOSPITAL_COMMUNITY): Payer: Medicare Other

## 2020-05-12 DIAGNOSIS — I350 Nonrheumatic aortic (valve) stenosis: Secondary | ICD-10-CM

## 2020-05-12 DIAGNOSIS — Z954 Presence of other heart-valve replacement: Secondary | ICD-10-CM

## 2020-05-12 DIAGNOSIS — Z01818 Encounter for other preprocedural examination: Secondary | ICD-10-CM

## 2020-05-12 LAB — BASIC METABOLIC PANEL
Anion gap: 10 (ref 5–15)
BUN: 17 mg/dL (ref 8–23)
CO2: 27 mmol/L (ref 22–32)
Calcium: 9.1 mg/dL (ref 8.9–10.3)
Chloride: 99 mmol/L (ref 98–111)
Creatinine, Ser: 0.7 mg/dL (ref 0.44–1.00)
GFR calc Af Amer: >60 mL/min (ref >60)
GFR calc non Af Amer: >60 mL/min (ref >60)
Glucose, Bld: 142 mg/dL — ABNORMAL HIGH (ref 70–99)
Potassium: 4.2 mmol/L (ref 3.5–5.1)
Sodium: 136 mmol/L (ref 135–145)

## 2020-05-12 LAB — CBC
HCT: 42.4 % (ref 36.0–46.0)
Hemoglobin: 13.5 g/dL (ref 12.0–15.0)
MCH: 27.1 pg (ref 26.0–34.0)
MCHC: 31.8 g/dL (ref 30.0–36.0)
MCV: 85 fL (ref 80.0–100.0)
Platelets: 216 10*3/uL (ref 150–400)
RBC: 4.99 MIL/uL (ref 3.87–5.11)
RDW: 13 % (ref 11.5–15.5)
WBC: 9.6 10*3/uL (ref 4.0–10.5)
nRBC: 0 % (ref 0.0–0.2)

## 2020-05-12 LAB — GLUCOSE, CAPILLARY
Glucose-Capillary: 160 mg/dL — ABNORMAL HIGH (ref 70–99)
Glucose-Capillary: 58 mg/dL — ABNORMAL LOW (ref 70–99)
Glucose-Capillary: 82 mg/dL (ref 70–99)
Glucose-Capillary: 222 mg/dL — ABNORMAL HIGH (ref 70–99)
Glucose-Capillary: 67 mg/dL — ABNORMAL LOW (ref 70–99)
Glucose-Capillary: 114 mg/dL — ABNORMAL HIGH (ref 70–99)

## 2020-05-12 MED ORDER — METOPROLOL TARTRATE 5 MG/5ML IV SOLN: 10.0000 mg | Freq: Once | INTRAVENOUS | Status: AC

## 2020-05-12 MED ORDER — IOHEXOL 350 MG/ML SOLN
100.0000 mL | Freq: Once | INTRAVENOUS | Status: AC | PRN
  Administered 2020-05-12: 100 mL via INTRAVENOUS

## 2020-05-12 MED ORDER — METOPROLOL TARTRATE 5 MG/5ML IV SOLN
INTRAVENOUS | Status: AC
  Administered 2020-05-12: 10 mg via INTRAVENOUS
  Filled 2020-05-12: qty 10

## 2020-05-12 NOTE — Progress Notes (Signed)
Progress Note  Patient Name: [name] Date of Encounter: 05/12/2020  [facility] Cardiologist: Chilton Si, MD   Subjective   No chest pain, dyspnea or dizziness this morning.   Inpatient Medications    Scheduled Meds: . aspirin EC  81 mg Oral Daily  . atorvastatin  40 mg Oral Daily  . citalopram  20 mg Oral Daily  . enoxaparin (LOVENOX) injection  40 mg Subcutaneous Q24H  . ezetimibe  10 mg Oral Daily  . gabapentin  300 mg Oral TID  . insulin aspart  0-9 Units Subcutaneous TID WC  . insulin aspart protamine- aspart  45 Units Subcutaneous BID WC  . metoprolol tartrate  12.5 mg Oral BID  . pantoprazole  40 mg Oral Daily  . sodium chloride flush  3 mL Intravenous Q12H   Continuous Infusions: . cefTRIAXone (ROCEPHIN)  IV 1 g (05/11/20 1454)   PRN Meds: acetaminophen **OR** acetaminophen, naphazoline-glycerin   Vital Signs    Vitals:   05/11/20 1840 05/11/20 2157 05/12/20 0500 05/12/20 0617  BP: (!) 107/58 (!) 150/65  (!) 127/91  Pulse: 70 74  73  Resp: 18     Temp: 98 F (36.7 C)   98.3 F (36.8 C)  TempSrc: Oral   Oral  SpO2: 99% 95%  95%  Weight:   83.2 kg     Intake/Output Summary (Last 24 hours) at 05/12/2020 0935 Last data filed at 05/12/2020 0800 Gross per 24 hour  Intake 492.34 ml  Output 700 ml  Net -207.66 ml   Last 3 Weights 05/12/2020 05/08/2020 05/05/2020  Weight (lbs) 183 lb 8 oz 182 lb 1.6 oz 188 lb 4.4 oz  Weight (kg) 83.235 kg 82.6 kg 85.4 kg      Telemetry    sinus - Personally Reviewed  ECG    No AM EKG- Personally Reviewed  Physical Exam   GEN: No acute distress.   Neck: No JVD Cardiac: RRR, systolic murmur noted.   Respiratory: Clear to auscultation bilaterally. GI: Soft, nontender, non-distended  MS: No edema; No deformity. Neuro:  Nonfocal  Psych: Normal affect   Labs    High Sensitivity Troponin:   Recent Labs  Lab 05/05/20 1446 05/05/20 1718  TROPONINIHS 149* 128*      Chemistry Recent Labs   Lab 05/09/20 9563 05/09/20 0907 05/09/20 0925 05/10/20 2359 05/12/20 0752  NA 138   < > 138 134* 136  K 3.1*   < > 3.4* 4.5 4.2  CL 98  --   --  95* 99  CO2 30  --   --  29 27  GLUCOSE 135*  --   --  261* 142*  BUN 17  --   --  33* 17  CREATININE 0.75  --   --  1.14* 0.70  CALCIUM 8.5*  --   --  8.9 9.1  PROT  --   --   --  7.4  --   ALBUMIN  --   --   --  3.2*  --   AST  --   --   --  44*  --   ALT  --   --   --  46*  --   ALKPHOS  --   --   --  179*  --   BILITOT  --   --   --  0.5  --   GFRNONAA >60  --   --  48* >60  GFRAA >60  --   --  55* >60  ANIONGAP 10  --   --  10 10   < > = values in this interval not displayed.     Hematology Recent Labs  Lab 05/10/20 0154 05/10/20 2359 05/12/20 0752  WBC 9.2 15.0* 9.6  RBC 5.43* 5.46* 4.99  HGB 14.7 14.8 13.5  HCT 45.5 46.3* 42.4  MCV 83.8 84.8 85.0  MCH 27.1 27.1 27.1  MCHC 32.3 32.0 31.8  RDW 13.0 13.0 13.0  PLT 218 219 216    BNP Recent Labs  Lab 05/05/20 1449 05/11/20 0058  BNP 215.2* 72.9     DDimer  Recent Labs  Lab 05/05/20 1458  DDIMER 0.49     Radiology    DG Orthopantogram  Result Date: 05/11/2020 CLINICAL DATA:  Poor dentition. EXAM: ORTHOPANTOGRAM/PANORAMIC COMPARISON:  None. FINDINGS: There are multiple missing teeth with caries involving 3 of the residual 7 mandibular teeth. Metallic restorations are present in 3 of the other teeth. No periapical lucency. Bone of the mandible is otherwise normal. Maxilla is normal. Maxillary sinuses are clear. IMPRESSION: Multiple missing teeth with caries involving 3 of the residual 7 mandibular teeth high. Electronically Signed   By: Francene Boyers M.D.   On: 05/11/2020 16:38   DG Chest 1 View  Result Date: 05/10/2020 CLINICAL DATA:  74 year old female chest pain. EXAM: CHEST  1 VIEW COMPARISON:  Chest radiograph dated 05/05/2020. FINDINGS: No focal consolidation, pleural effusion, pneumothorax. The cardiac silhouette is within normal limits. Coronary  vascular calcification. No acute osseous pathology. IMPRESSION: No focal consolidation. Electronically Signed   By: Elgie Collard M.D.   On: 05/10/2020 23:54   CT Head Wo Contrast  Result Date: 05/10/2020 CLINICAL DATA:  Recent fall EXAM: CT HEAD WITHOUT CONTRAST CT CERVICAL SPINE WITHOUT CONTRAST TECHNIQUE: Multidetector CT imaging of the head and cervical spine was performed following the standard protocol without intravenous contrast. Multiplanar CT image reconstructions of the cervical spine were also generated. COMPARISON:  12/19/2014 FINDINGS: CT HEAD FINDINGS Brain: Mild atrophic changes and chronic white matter ischemic change is seen. No acute hemorrhage, acute infarction or space-occupying mass lesion are noted. Vascular: No hyperdense vessel or unexpected calcification. Skull: Normal. Negative for fracture or focal lesion. Sinuses/Orbits: No acute finding. Other: None. CT CERVICAL SPINE FINDINGS Alignment: Within normal limits. Skull base and vertebrae: 7 cervical segments are well visualized. Vertebral body height is well maintained. Disc space narrowing is noted at C5-6 and C6-7 associated osteophytic changes. Mild facet hypertrophic changes are seen as well at multiple levels. No acute fracture or acute facet abnormality is noted. Soft tissues and spinal canal: Scattered small hypodensities within the thyroid are noted. These are less than 1 cm in size. Remaining soft tissue structures are within normal limits. Upper chest: Visualized lung apices are within normal limits. Other: None IMPRESSION: CT of the head: Chronic atrophic and ischemic changes without acute abnormality. CT of the cervical spine: Multilevel degenerative change without acute abnormality. Scattered small hypodensities within the thyroid. No followup recommended (ref: J Am Coll Radiol. 2015 Feb;12(2): 143-50). Electronically Signed   By: Alcide Clever M.D.   On: 05/10/2020 23:53   CT Angio Chest PE W/Cm &/Or Wo Cm  Result  Date: 05/11/2020 CLINICAL DATA:  Shortness of breath and hypoxia EXAM: CT ANGIOGRAPHY CHEST WITH CONTRAST TECHNIQUE: Multidetector CT imaging of the chest was performed using the standard protocol during bolus administration of intravenous contrast. Multiplanar CT image reconstructions and MIPs were obtained to evaluate the vascular anatomy. CONTRAST:  20mL  OMNIPAQUE IOHEXOL 350 MG/ML SOLN COMPARISON:  None. FINDINGS: Cardiovascular: There is a optimal opacification of the pulmonary arteries. There is no central,segmental, or subsegmental filling defects within the pulmonary arteries. The heart is normal in size. No pericardial effusion or thickening. No evidence right heart strain. There is normal three-vessel brachiocephalic anatomy without proximal stenosis. Scattered aortic atherosclerotic calcifications are seen without aneurysmal dilatation. Aortic valve and coronary artery calcifications are seen. Mediastinum/Nodes: No hilar, mediastinal, or axillary adenopathy. There is a partially calcified subcarinal lymph node present. Thyroid gland, trachea, and esophagus demonstrate no significant findings. Lungs/Pleura: Mild bibasilar dependent atelectasis noted. No large airspace consolidation. No pleural effusion or pneumothorax. No airspace consolidation. Upper Abdomen: No acute abnormalities present in the visualized portions of the upper abdomen. Musculoskeletal: No chest wall abnormality. No acute or significant osseous findings. Review of the MIP images confirms the above findings. IMPRESSION: No central, segmental, or subsegmental pulmonary embolism. No other acute intrathoracic pathology to explain the patient's symptoms. Aortic Atherosclerosis (ICD10-I70.0). Electronically Signed   By: Prudencio Pair M.D.   On: 05/11/2020 01:57   CT Cervical Spine Wo Contrast  Result Date: 05/10/2020 CLINICAL DATA:  Recent fall EXAM: CT HEAD WITHOUT CONTRAST CT CERVICAL SPINE WITHOUT CONTRAST TECHNIQUE: Multidetector CT  imaging of the head and cervical spine was performed following the standard protocol without intravenous contrast. Multiplanar CT image reconstructions of the cervical spine were also generated. COMPARISON:  12/19/2014 FINDINGS: CT HEAD FINDINGS Brain: Mild atrophic changes and chronic white matter ischemic change is seen. No acute hemorrhage, acute infarction or space-occupying mass lesion are noted. Vascular: No hyperdense vessel or unexpected calcification. Skull: Normal. Negative for fracture or focal lesion. Sinuses/Orbits: No acute finding. Other: None. CT CERVICAL SPINE FINDINGS Alignment: Within normal limits. Skull base and vertebrae: 7 cervical segments are well visualized. Vertebral body height is well maintained. Disc space narrowing is noted at C5-6 and C6-7 associated osteophytic changes. Mild facet hypertrophic changes are seen as well at multiple levels. No acute fracture or acute facet abnormality is noted. Soft tissues and spinal canal: Scattered small hypodensities within the thyroid are noted. These are less than 1 cm in size. Remaining soft tissue structures are within normal limits. Upper chest: Visualized lung apices are within normal limits. Other: None IMPRESSION: CT of the head: Chronic atrophic and ischemic changes without acute abnormality. CT of the cervical spine: Multilevel degenerative change without acute abnormality. Scattered small hypodensities within the thyroid. No followup recommended (ref: J Am Coll Radiol. 2015 Feb;12(2): 143-50). Electronically Signed   By: Inez Catalina M.D.   On: 05/10/2020 23:53   DG Knee Complete 4 Views Left  Result Date: 05/11/2020 CLINICAL DATA:  Fall, knee pain EXAM: LEFT KNEE - COMPLETE 4+ VIEW COMPARISON:  None. FINDINGS: Advanced tricompartment degenerative changes with joint space narrowing and spurring. Trace associated joint effusion. No acute bony abnormality. Specifically, no fracture, subluxation, or dislocation. IMPRESSION: Advanced  degenerative changes and trace joint effusion. No acute bony abnormality. Electronically Signed   By: Rolm Baptise M.D.   On: 05/11/2020 00:01   DG Hip Unilat W or Wo Pelvis 2-3 Views Left  Result Date: 05/10/2020 CLINICAL DATA:  Hip pain, fall EXAM: DG HIP (WITH OR WITHOUT PELVIS) 2-3V LEFT COMPARISON:  None. FINDINGS: Hip joints and SI joints are symmetric and unremarkable. No acute bony abnormality. Specifically, no fracture, subluxation, or dislocation. IMPRESSION: No acute bony abnormality. Electronically Signed   By: Rolm Baptise M.D.   On: 05/10/2020 23:58  Cardiac Studies     Patient Profile     74 y.o. female a history of hypertension, type 2 diabetes mellitus, hyperlipidemia, CAD and severe aortic stenosis admitted with syncope. She was just discharged on 05/10/20 following admission with dyspnea and mild troponin elevation. Cardiac cath 05/09/20 with mild non-obstructive CAD. She was also found to have severe AS. Discharged 05/10/20 and readmitted 05/11/20 following syncopal event felt to be due orthostasis/dehydration due to over-diuresis.   Assessment & Plan    1. Severe aortic stenosis 2. Syncope  Her syncope prior to this admission is felt to be due to hypovolemia due to overdiuresis. Renal function normal today after hydration. She also has severe AS and we had planed workup for TAVR as an outpatient. Will perform her pre-TAVR CT scans today. I do not think we will need to move her TAVR up to an inpatient slot next week. She can be discharged home this weekend and we will arrange CT surgery appt as an outpatient and likely TAVR on 05/23/20.   For questions or updates, please contact [facility] Please consult [email or web address] for contact info under        Signed, Verne Carrow, MD  05/12/2020, 9:35 AM

## 2020-05-12 NOTE — Progress Notes (Signed)
Carotid artery duplex completed. Refer to "CV Proc" under chart review to view preliminary results.  05/12/2020 11:36 AM Eula Fried., MHA, RVT, RDCS, RDMS

## 2020-05-12 NOTE — Consult Note (Signed)
   [facility] CM Inpatient Consult   05/12/2020  [name] 1946/09/22 [id number]   [facility] ACO Patient:  Pending status  Patient referred for post hospital follow up on 05/10/20 for NSTEMI and high Hgb A1C Diabetes and readmitted on 05/11/20 with as per MD notes which includes but not limited as follows from history and physical per Dr. Fuller Plan notes 05/11/20:  Since leaving the hospital patient reports that she has been constantly dizzy.  She is woken up on the floor at least 3-4 times.    Plan;  Follow for progress and disposition needs.  Plan for post hospital follow up for complex disease management, if she returns home, if she goes to a skilled nursing facility for rehab, her needs with be met at that level of care, then will close status for [facility] follow up.      Of note, [facility] Care Management services does not replace or interfere with any services that are arranged by inpatient case management or social work.  For additional questions or referrals please contact:    Natividad Brood, RN BSN [place] Liaison  [phone] business mobile phone Toll free office [phone]  Fax number: [phone] [name]  [email or web address]

## 2020-05-12 NOTE — Progress Notes (Signed)
Physical Therapy Treatment Patient Details Name: [name] MRN: [id number] DOB: 06/23/1946 Today's Date: 05/12/2020    History of Present Illness Pt is a 74 y/o female admitted secondary to syncope and positive orthostatics. Pt with recent admission secondary to severe aortic stenosis and underwent cardica cath. PMH includes DM, HTN, and gerd.     PT Comments     PT TAVR Pre-Assessment  Clinical Impression Statement: Pt is a 74 y.o female being assessed for pre-TAVR.  Pt reports symptoms of fatigue and weakness with activity.  Pt ambulated 410.8 ft during the 6 minute walk test requiring 1 seated rest break.  5 meter walk test produced an average gait speed of .8 ft/sec which indicates pt is a fall risk.  RPE was 11 and dyspnea was 3 during mobility.  Pt was limited by left knee pain/weakness.  Pt's frailty rating was 4 which is considered vulnerable.  Pt would benefit from continued PT in the acute care setting due to the above listed deficits in balance, strength, ROM, endurance and activity tolerance. Discharge recommendation updated to home with HHPT with support of family as pt planning to get a TAVR next week and will likely need SNF post procedure. Will discuss with family.    6 Minute Walk Test:   Total Distance Walked:410.8 ft.    Did the pt need a rest break? Yes If yes, why? Pain:Yes; Fatigue:Yes; Dyspnea/O2 saturations: No Comments: Patient required 1 seated rest break due to left knee pain/weakness.    Pre-Test Post-Test  BP 93/78 93/54  HR 66 bpm 70 bpm  O2 saturations (indicated RA or L/min [place]) 96% on RA 93% on RA  Modified Borg Dyspnea Scale (0 none-10 maximal) 0 3  RPE (6 very light-10 very hard) 6 11  Comments:   5 Meter Walk Test:  Trial 1 13.95 seconds  Trial 2 11.45 seconds  Trial 3 11.36 seconds  3 Trial Average/Gait Speed 12.25 seconds/.8 ft/sec (<1.8 ft/sec indicates high fall risk)  Comments: Patient is a fall risk due to slow gait  speed.  Clinical Frailty Scale (1 very fit - 9 terminally ill): 4 (</= 5/12 is considered frail)     Follow Up Recommendations  Home health PT;Supervision for mobility/OOB;Supervision/Assistance - 24 hour     Equipment Recommendations  Other (comment)(TBA)    Recommendations for Other Services       Precautions / Restrictions Precautions Precautions: Fall;Other (comment) Precaution Comments: watch BP  Restrictions Weight Bearing Restrictions: No    Mobility  Bed Mobility Overal bed mobility: Needs Assistance Bed Mobility: Sit to Supine     Supine to sit: Min assist Sit to supine: Supervision;HOB elevated   General bed mobility comments: no assist needed.  Transfers Overall transfer level: Needs assistance Equipment used: Rolling walker (2 wheeled) Transfers: Sit to/from Stand Sit to Stand: Min guard         General transfer comment: Min guard for safety. Stood multiple times from chair, rollator seat during pre TAVR testing, stood from toilet x1.  Ambulation/Gait Ambulation/Gait assistance: Min guard Gait Distance (Feet): 440 Feet Assistive device: Rolling walker (2 wheeled) Gait Pattern/deviations: Step-through pattern;Decreased stride length;Decreased stance time - left;Antalgic Gait velocity: decreased   General Gait Details: Slow, mostly steady gait with use of RW for support; fatigues with weakness in left knee.   Stairs             Wheelchair Mobility    Modified Rankin (Stroke Patients Only)  Balance Overall balance assessment: Needs assistance Sitting-balance support: Feet supported;No upper extremity supported Sitting balance-Leahy Scale: Fair Sitting balance - Comments: Has trouble sitting up without back support for long periods of time, fatigued and started leaning bck   Standing balance support: During functional activity Standing balance-Leahy Scale: Poor Standing balance comment: Requires UE support; able to wash hands  at sink without support, left knee instability noted.                            Cognition Arousal/Alertness: Awake/alert Behavior During Therapy: WFL for tasks assessed/performed Overall Cognitive Status: Within Functional Limits for tasks assessed                                        Exercises      General Comments General comments (skin integrity, edema, etc.): Bp soft but asymptomatic.      Pertinent Vitals/Pain Pain Assessment: Faces Faces Pain Scale: Hurts little more Pain Location: left knee Pain Descriptors / Indicators: Aching;Sore Pain Intervention(s): Repositioned;Monitored during session    Home Living Family/patient expects to be discharged to:: Private residence Living Arrangements: Alone Available Help at Discharge: Family Type of Home: House Home Access: Ramped entrance   Home Layout: One level Home Equipment: Environmental consultant - 2 wheels;Cane - single point      Prior Function Level of Independence: Independent with assistive device(s)      Comments: Sister helps with IADLs/home management. Uses RW vs cane depending on the day    PT Goals (current goals can now be found in the care plan section) Acute Rehab PT Goals Patient Stated Goal: To go home to own house Progress towards PT goals: Progressing toward goals    Frequency    Min 3X/week      PT Plan Discharge plan needs to be updated    Co-evaluation              AM-PAC PT "6 Clicks" Mobility   Outcome Measure  Help needed turning from your back to your side while in a flat bed without using bedrails?: None Help needed moving from lying on your back to sitting on the side of a flat bed without using bedrails?: A Little Help needed moving to and from a bed to a chair (including a wheelchair)?: A Little Help needed standing up from a chair using your arms (e.g., wheelchair or bedside chair)?: A Little Help needed to walk in hospital room?: A Little Help needed  climbing 3-5 steps with a railing? : A Lot 6 Click Score: 18    End of Session Equipment Utilized During Treatment: Gait belt Activity Tolerance: Patient tolerated treatment well Patient left: in bed;with call bell/phone within reach;with bed alarm set Nurse Communication: Mobility status PT Visit Diagnosis: Unsteadiness on feet (R26.81);Muscle weakness (generalized) (M62.81);History of falling (Z91.81)     Time: 4097-3532 PT Time Calculation (min) (ACUTE ONLY): 36 min  Charges:  $Therapeutic Activity: 8-22 mins $Physical Performance Test: 8-22 mins                     [name], PT, DPT Acute Rehabilitation Services Pager [phone] Office [phone]       Blake Divine A Lanier Ensign 05/12/2020, 4:05 PM

## 2020-05-12 NOTE — Progress Notes (Addendum)
Occupational Therapy Evaluation Patient Details Name: [name] MRN: [id number] DOB: August 16, 1946 Today's Date: 05/12/2020    History of Present Illness Pt is a 74 y/o female admitted secondary to syncope and positive orthostatics. Pt with recent admission secondary to severe aortic stenosis and underwent cardica cath. PMH includes DM, HTN, and gerd.    Clinical Impression   Patient lives alone at home and is independent with devices at prior level.  Sister comes over frequently to assist with home management.  She has had L knee pain for last few weeks causing increased difficulty with mobility.  She usually uses a walker for out of home mobility and cane for in home.  Today patient able to stand with min assist and walker, though frequent L knee buckling.  Walked 10 ft and stood at sink for 4 min but patient feeling too weak and needing to sit.  Completed UB bath while seated and needed min assist to maintain balance after fatigue.  Patient would benefit from OT to work on increasing activity tolerance, strength, and balance for increased ADL independence.  Recommending SNF but may be able to progress to [place].  BP: 115/52 lay 132/62 sit 110/64 stand 0 min 114/56 stand 3 min    Follow Up Recommendations  SNF;Supervision - Intermittent;Other (comment)(Possible progress to [facility])    Equipment Recommendations  3 in 1 bedside commode    Recommendations for Other Services       Precautions / Restrictions Precautions Precautions: Fall;Other (comment) Precaution Comments: watch BP  Restrictions Weight Bearing Restrictions: No      Mobility Bed Mobility Overal bed mobility: Needs Assistance Bed Mobility: Sit to Supine;Supine to Sit     Supine to sit: Min assist Sit to supine: Min assist      Transfers Overall transfer level: Needs assistance Equipment used: Rolling walker (2 wheeled) Transfers: Sit to/from Stand Sit to Stand: Min assist              Balance  Overall balance assessment: Needs assistance Sitting-balance support: No upper extremity supported;Feet supported Sitting balance-Leahy Scale: Fair Sitting balance - Comments: Has trouble sitting up without back support for long periods of time, fatigued and started leaning bck   Standing balance support: Bilateral upper extremity supported;During functional activity Standing balance-Leahy Scale: Poor Standing balance comment: L knee buckles                            ADL either performed or assessed with clinical judgement   ADL Overall ADL's : Needs assistance/impaired Eating/Feeding: Set up;Sitting   Grooming: Set up;Sitting   Upper Body Bathing: Min guard;Sitting   Lower Body Bathing: Moderate assistance;Sitting/lateral leans   Upper Body Dressing : Min guard;Sitting   Lower Body Dressing: Moderate assistance;Sitting/lateral leans   Toilet Transfer: Minimal assistance;RW;Regular Toilet           Functional mobility during ADLs: Minimal assistance;Rolling walker       Vision         Perception     Praxis      Pertinent Vitals/Pain Pain Assessment: Faces Faces Pain Scale: Hurts little more Pain Location: Feet, L Knee Pain Descriptors / Indicators: Aching Pain Intervention(s): Limited activity within patient's tolerance;Monitored during session     Hand Dominance Right   Extremity/Trunk Assessment Upper Extremity Assessment Upper Extremity Assessment: Generalized weakness           Communication Communication Communication: No difficulties   Cognition Arousal/Alertness:  Awake/alert Behavior During Therapy: WFL for tasks assessed/performed Overall Cognitive Status: Within Functional Limits for tasks assessed                                     General Comments  Took orthostatics, BP in note. No dizziness    Exercises     Shoulder Instructions      Home Living Family/patient expects to be discharged to:: Private  residence Living Arrangements: Alone Available Help at Discharge: Family Type of Home: House Home Access: Ramped entrance     Home Layout: One level     Bathroom Shower/Tub: Producer, television/film/video: Standard     Home Equipment: Environmental consultant - 2 wheels;Cane - single point          Prior Functioning/Environment Level of Independence: Independent with assistive device(s)        Comments: Sister helps with IADLs/home management. Uses RW vs cane depending on the day         OT Problem List: Decreased strength;Decreased activity tolerance;Impaired balance (sitting and/or standing);Pain      OT Treatment/Interventions: Self-care/ADL training;Therapeutic exercise;Energy conservation;Therapeutic activities;Balance training;Patient/family education    OT Goals(Current goals can be found in the care plan section) Acute Rehab OT Goals Patient Stated Goal: To go home to own house OT Goal Formulation: With patient Time For Goal Achievement: 05/26/20 Potential to Achieve Goals: Good  OT Frequency: Min 2X/week   Barriers to D/C: Decreased caregiver support          Co-evaluation              AM-PAC OT "6 Clicks" Daily Activity     Outcome Measure Help from another person eating meals?: None Help from another person taking care of personal grooming?: A Little Help from another person toileting, which includes using toliet, bedpan, or urinal?: A Little Help from another person bathing (including washing, rinsing, drying)?: A Lot Help from another person to put on and taking off regular upper body clothing?: A Little Help from another person to put on and taking off regular lower body clothing?: A Lot 6 Click Score: 17   End of Session Equipment Utilized During Treatment: Gait belt;Rolling walker Nurse Communication: Mobility status  Activity Tolerance: Patient tolerated treatment well Patient left: in bed;with call bell/phone within reach;with bed alarm set  OT  Visit Diagnosis: Unsteadiness on feet (R26.81);Repeated falls (R29.6);History of falling (Z91.81);Muscle weakness (generalized) (M62.81);Pain Pain - Right/Left: Left Pain - part of body: Knee                Time: 0912-1005 OT Time Calculation (min): 53 min Charges:  OT General Charges $OT Visit: 1 Visit OT Evaluation $OT Eval Moderate Complexity: 1 Mod OT Treatments $Self Care/Home Management : 38-52 mins  Barbie Banner, OTR/L   [name] 05/12/2020, 1:47 PM

## 2020-05-12 NOTE — NC FL2 (Signed)
[place] MEDICAID FL2 LEVEL OF CARE SCREENING TOOL     IDENTIFICATION  Patient Name: [name] Birthdate: 09/13/1946 Sex: female Admission Date (Current Location): 05/10/2020  [facility] and [place] Number:  Herbalist and Address:  [facility], [place] [address], [place], [place] [address]      Provider Number: [id number]  Attending Physician Name and Address:  Geradine Girt, DO  Relative Name and Phone Number:       Current Level of Care: Hospital Recommended Level of Care: [place] Prior Approval Number:    Date Approved/Denied:   PASRR Number: Pending  Discharge Plan: SNF    Current Diagnoses: Patient Active Problem List   Diagnosis Date Noted  . Syncope 05/11/2020  . Pressure injury of skin 05/11/2020  . Orthostatic hypotension 05/11/2020  . Dental caries 05/11/2020  . Aortic stenosis   . Non-ST elevation myocardial infarction (NSTEMI) ([place]) 05/05/2020  . NSTEMI (non-ST elevated myocardial infarction) ([place]) 05/05/2020  . Diabetic neuropathy ([place]) 11/13/2018  . Left knee pain 10/24/2017  . Greater trochanteric bursitis of left hip 10/24/2017  . AKI (acute kidney injury) ([place]) 12/19/2014  . Reactive airway disease 04/09/2014  . Seasonal allergic rhinitis 04/09/2014  . Preventative health care 04/09/2014  . Abnormality of gait 03/17/2013  . Irritable bowel syndrome (IBS) 08/27/2012  . Type II diabetes mellitus with neurological manifestations, uncontrolled ([place]) 06/18/2007  . GERD 06/18/2007  . Hyperlipidemia associated with type 2 diabetes mellitus ([place]) 12/27/2006  . Essential hypertension 09/19/2006  . Venous (peripheral) insufficiency 09/19/2006    Orientation RESPIRATION BLADDER Height & Weight     Self, Time, Situation, Place  Normal Incontinent, External catheter Weight: 183 lb 8 oz (83.2 kg) Height:     BEHAVIORAL SYMPTOMS/MOOD NEUROLOGICAL BOWEL NUTRITION STATUS      Continent Diet(See  discharge summary)  AMBULATORY STATUS COMMUNICATION OF NEEDS Skin   Extensive Assist Verbally Other (Comment)(Pressure Injury Coccyx Medial Stage 2)                       Personal Care Assistance Level of Assistance  Bathing, Feeding, Dressing Bathing Assistance: Limited assistance Feeding assistance: Independent Dressing Assistance: Limited assistance     Functional Limitations Info  Sight, Hearing, Speech Sight Info: Adequate Hearing Info: Adequate Speech Info: Adequate    SPECIAL CARE FACTORS FREQUENCY  PT (By licensed PT), OT (By licensed OT)     PT Frequency: 5x a week OT Frequency: 5x a week            Contractures Contractures Info: Not present    Additional Factors Info  Code Status, Allergies Code Status Info: Full Allergies Info: Morphine, Codeine Sulfate, Demerol (Meperidine), Meperidine Hcl, Propoxyphene Hcl           Current Medications (05/12/2020):  This is the current hospital active medication list Current Facility-Administered Medications  Medication Dose Route Frequency Provider Last Rate Last Admin  . acetaminophen (TYLENOL) tablet 650 mg  650 mg Oral Q6H PRN Fuller Plan A, MD       Or  . acetaminophen (TYLENOL) suppository 650 mg  650 mg Rectal Q6H PRN Fuller Plan A, MD      . aspirin EC tablet 81 mg  81 mg Oral Daily Smith, Rondell A, MD   81 mg at 05/12/20 0830  . atorvastatin (LIPITOR) tablet 40 mg  40 mg Oral Daily Smith, Rondell A, MD   40 mg at 05/12/20 0830  .  cefTRIAXone (ROCEPHIN) 1 g in sodium chloride 0.9 % 100 mL IVPB  1 g Intravenous Q24H Smith, Rondell A, MD 200 mL/hr at 05/11/20 1454 1 g at 05/11/20 1454  . citalopram (CELEXA) tablet 20 mg  20 mg Oral Daily Smith, Rondell A, MD   20 mg at 05/12/20 0830  . enoxaparin (LOVENOX) injection 40 mg  40 mg Subcutaneous Q24H Smith, Rondell A, MD   40 mg at 05/11/20 1303  . ezetimibe (ZETIA) tablet 10 mg  10 mg Oral Daily Katrinka Blazing, Rondell A, MD   10 mg at 05/12/20 0830  . gabapentin  (NEURONTIN) capsule 300 mg  300 mg Oral TID Madelyn Flavors A, MD   300 mg at 05/12/20 0829  . insulin aspart (novoLOG) injection 0-9 Units  0-9 Units Subcutaneous TID WC Madelyn Flavors A, MD   2 Units at 05/12/20 0830  . insulin aspart protamine- aspart (NOVOLOG MIX 70/30) injection 45 Units  45 Units Subcutaneous BID WC Clydie Braun, MD   45 Units at 05/12/20 0645  . metoprolol tartrate (LOPRESSOR) tablet 12.5 mg  12.5 mg Oral BID Madelyn Flavors A, MD   12.5 mg at 05/12/20 9242  . naphazoline-glycerin (CLEAR EYES REDNESS) ophth solution 1-2 drop  1-2 drop Both Eyes QID PRN Madelyn Flavors A, MD      . pantoprazole (PROTONIX) EC tablet 40 mg  40 mg Oral Daily Smith, Rondell A, MD   40 mg at 05/12/20 0830  . sodium chloride flush (NS) 0.9 % injection 3 mL  3 mL Intravenous Q12H Smith, Rondell A, MD   3 mL at 05/12/20 0831     Discharge Medications: Please see discharge summary for a list of discharge medications.  Relevant Imaging Results:  Relevant Lab Results:   Additional Information SSN [id number]  Dannette Barbara Hostetter, [place]

## 2020-05-12 NOTE — TOC Initial Note (Addendum)
Transition of Care [facility]) - Initial/Assessment Note    Patient Details  Name: [name] MRN: [id number] Date of Birth: [birth year]  [facility] CM/SW Contact:    Jacquelynn Cree Phone Number: 05/12/2020, 11:30 AM  Clinical Narrative:                 CSW met with patient bedside to discuss PT recommendation of SNF placement at discharge. Patient shared that prior to arrival, she lived alone. She stated her home has steps, but she also has a ramp. She has a sister in law Harrell, who visits and assists with getting groceries.   Patient expressed she is agreeable to SNF placement at time of discharge if insurance will cover it. CSW explained insurance authorization will be requested, prior to discharge. Patient stated she is not familiair with any SNFs, and proiveed permission to be faxed out to [facility]. Verbal permission was provided to contact Juliann Pulse [phone]. No further questions expressed at this time.   Pasrr submitted, under level 2 review. TOC team will continue to follow and assist with discharge planning needs.     Expected Discharge Plan: Skilled Nursing Facility Barriers to Discharge: Insurance Authorization, Continued Medical Work up   Patient Goals and CMS Choice Patient states their goals for this hospitalization and ongoing recovery are:: to return home. CMS Medicare.gov Compare Post Acute Care list provided to:: Patient Choice offered to / list presented to : Patient  Expected Discharge Plan and Services Expected Discharge Plan: [place] arrangements for the past 2 months: Single Family Home                                      Prior Living Arrangements/Services Living arrangements for the past 2 months: Single Family Home Lives with:: Self Patient language and need for interpreter reviewed:: Yes Do you feel safe going back to the place where you live?: Yes      Need for Family  Participation in Patient Care: No (Comment) Care giver support system in place?: Yes (comment)   Criminal Activity/Legal Involvement Pertinent to Current Situation/Hospitalization: No - Comment as needed  Activities of Daily Living      Permission Sought/Granted Permission sought to share information with : Facility Sport and exercise psychologist, Family Supports Permission granted to share information with : Yes, Verbal Permission Granted  Share Information with NAME: Juliann Pulse  Permission granted to share info w AGENCY: SNF  Permission granted to share info w Relationship: Sister-in-law  Permission granted to share info w Contact Information: [phone]  Emotional Assessment     Affect (typically observed): Unable to Assess Orientation: : Oriented to Self, Oriented to Place, Oriented to Situation, Oriented to  Time Alcohol / Substance Use: Not Applicable Psych Involvement: No (comment)  Admission diagnosis:  Syncope [R55] Severe aortic stenosis [I35.0] Poor dentition [K08.9] Acute respiratory failure with hypoxia (HCC) [J96.01] Chest pain [R07.9] Syncope, unspecified syncope type [R55] Patient Active Problem List   Diagnosis Date Noted  . Syncope 05/11/2020  . Pressure injury of skin 05/11/2020  . Orthostatic hypotension 05/11/2020  . Dental caries 05/11/2020  . Aortic stenosis   . Non-ST elevation myocardial infarction (NSTEMI) ([place]) 05/05/2020  . NSTEMI (non-ST elevated myocardial infarction) ([place]) 05/05/2020  . Diabetic neuropathy ([place]) 11/13/2018  . Left knee pain 10/24/2017  . Greater trochanteric bursitis of left hip 10/24/2017  .  AKI (acute kidney injury) ([place]) 12/19/2014  . Reactive airway disease 04/09/2014  . Seasonal allergic rhinitis 04/09/2014  . Preventative health care 04/09/2014  . Abnormality of gait 03/17/2013  . Irritable bowel syndrome (IBS) 08/27/2012  . Type II diabetes mellitus with neurological manifestations, uncontrolled ([place]) 06/18/2007  . GERD  06/18/2007  . Hyperlipidemia associated with type 2 diabetes mellitus ([place]) 12/27/2006  . Essential hypertension 09/19/2006  . Venous (peripheral) insufficiency 09/19/2006   PCP:  Hoyt Koch, MD Pharmacy:   [facility] [place], [place] [address] Phone: [phone] Fax: [phone]  [place], [place] [facility] [address] [place] [address] [place] [address] Phone: [phone] Fax: [place], [place] [address] Phone: [phone] Fax: [phone]     Social Determinants of Health (SDOH) Interventions    Readmission Risk Interventions No flowsheet data found.

## 2020-05-12 NOTE — Progress Notes (Signed)
Progress Note    [name]  DGL:[id number] DOB: 1946/05/29  DOA: 05/10/2020 PCP: Hoyt Koch, MD    Brief Narrative:     Medical records reviewed and are as summarized below:  [name] is an 74 y.o. female with medical history significant of hypertension, hyperlipidemia, CAD, severe aortic stenosis, diabetes mellitus type 2, and chronic back pain who presents after having a fall with loss of consciousness.  She had just been recently hospitalized from 5/28-6/2 with complaints of chest pain with NSTEMI.  She underwent cardiac cath which showed nonobstructive disease.  Since leaving the hospital patient reports that she has been constantly dizzy.  She is woken up on the floor at least 3-4 times.  Denies having any chest pain or shortness of breath.  She had been nauseated 3 to 4 days prior to coming into the hospital initially on 5/28, but reports that those symptoms have now resolved.  Assessment/Plan:   Principal Problem:   Syncope Active Problems:   Type II diabetes mellitus with neurological manifestations, uncontrolled (HCC)   Hyperlipidemia associated with type 2 diabetes mellitus ([place])   Essential hypertension   AKI (acute kidney injury) ([place])   Aortic stenosis   Pressure injury of skin   Orthostatic hypotension   Dental caries   Syncope Orthostatic hypotension: Acute.  Patient presents after having multiple syncopal episodes at home.  CTA of the chest did not note any signs of a pulmonary embolus.  Patient denies any complaints of chest pain.  Orthostatic vital signs noted to be positive.  Suspect patient has likely been over diuresed during last hospitalization. 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  She reports having dysuria.  UA positive for signs of infection.  -Urine culture: multiple species -Rocephin IV x3day  Acute kidney injury: Creatinine elevated up to 1.14 with BUN 33 on  admission.  Previous baseline seen to be 0.75 on 6/1.  The elevated BUN to creatinine ratio gives concern for prerenal cause of symptoms.  Suspect secondary to overdiuresis possibly. -Gentle IV fluids normal saline at 50 mL/h for total of 500 mL. -Recheck kidney function in a.m.  Aortic stenosis: Cardiology would like to have CT angiogram studies of the heart for better characterization of the patient's aortic stenosis.  As well as of the chest abdomen and pelvis.   -per cards  Essential hypertension:  -per cards  Dental caries: Patient noted to have multiple missing teeth with dental caries involving 3 of the residual segment mandibular teeth. -Patient need to follow up with dentist at discharge  Diabetes mellitus type 2: On admission blood sugar elevated to 261.  Home medications include Metformin 500 mg twice daily and Humalog75/25 mix 45 units twice daily -Hypoglycemic protocols -Hold Metformin -Continue home Humalog 75/25 mix dose -CBGs before every meal and at bedtime with sensitive SSI  Dyslipidemia: Home medications include Zetia 10 mg daily and atorvastatin 40 mg daily. -Continue current home regimen  GERD: Home medications include Nexium 40 mg daily. -Continue pharmacy substitution of Protonix  obesity Body mass index is 31.5 kg/m.   Family Communication/Anticipated D/C date and plan/Code Status   DVT prophylaxis: Lovenox ordered. Code Status: Full Code.  Disposition Plan: Status is: Inpatient  Remains inpatient appropriate because:Unsafe d/c plan   Dispo: The patient is from: Home              Anticipated d/c is to: SNF  Anticipated d/c date is: 2 days              Patient currently is not medically stable to d/c.         Medical Consultants:    cards  Subjective:   hungry  Objective:    Vitals:   05/11/20 1840 05/11/20 2157 05/12/20 0500 05/12/20 0617  BP: (!) 107/58 (!) 150/65  (!) 127/91  Pulse: 70 74  73  Resp: 18       Temp: 98 F (36.7 C)   98.3 F (36.8 C)  TempSrc: Oral   Oral  SpO2: 99% 95%  95%  Weight:   83.2 kg     Intake/Output Summary (Last 24 hours) at 05/12/2020 1149 Last data filed at 05/12/2020 0800 Gross per 24 hour  Intake 492.34 ml  Output 700 ml  Net -207.66 ml   Filed Weights   05/12/20 0500  Weight: 83.2 kg    Exam:  General: Appearance:    Mildly obese female in no acute distress  Eyes:    PERRL, conjunctiva/corneas clear, EOM's intact       Lungs:     Clear to auscultation bilaterally, respirations unlabored  Heart:    Normal heart rate. Normal rhythm.  +murmur  MS:   All extremities are intact.   Neurologic:   Awake, alert, oriented x 3. No apparent focal neurological           defect.     Data Reviewed:   I have personally reviewed following labs and imaging studies:  Labs: Labs show the following:   Basic Metabolic Panel: Recent Labs  Lab 05/05/20 1718 05/06/20 0750 05/07/20 0218 05/07/20 1478 05/08/20 0236 05/08/20 0236 05/09/20 2956 05/09/20 2130 05/09/20 8657 05/09/20 8469 05/09/20 6295 05/09/20 0925 05/09/20 0925 05/10/20 2359 05/12/20 0752  NA  --    < > 135   < > 137   < > 138  --   --  139  --  138  --  134* 136  K  --    < > 3.7   < > 4.9   < > 3.1*   < >  --  3.5   < > 3.4*   < > 4.5 4.2  CL  --    < > 96*  --  96*  --  98  --   --   --   --   --   --  95* 99  CO2  --    < > 28  --  33*  --  30  --   --   --   --   --   --  29 27  GLUCOSE  --    < > 257*  --  215*  --  135*  --   --   --   --   --   --  261* 142*  BUN  --    < > 21  --  21  --  17  --   --   --   --   --   --  33* 17  CREATININE  --    < > 0.85  --  1.10*  --  0.75  --   --   --   --   --   --  1.14* 0.70  CALCIUM  --    < > 8.4*  --  8.6*  --  8.5*  --   --   --   --   --   --  8.9 9.1  MG 1.8  --   --   --   --   --   --   --  1.7  --   --   --   --   --   --    < > = values in this interval not displayed.   GFR Estimated Creatinine Clearance: 65.4 mL/min (by C-G  formula based on SCr of 0.7 mg/dL). Liver Function Tests: Recent Labs  Lab 05/10/20 2359  AST 44*  ALT 46*  ALKPHOS 179*  BILITOT 0.5  PROT 7.4  ALBUMIN 3.2*   No results for input(s): LIPASE, AMYLASE in the last 168 hours. No results for input(s): AMMONIA in the last 168 hours. Coagulation profile No results for input(s): INR, PROTIME in the last 168 hours.  CBC: Recent Labs  Lab 05/08/20 0236 05/08/20 0236 05/09/20 0254 05/09/20 0254 05/09/20 4431 05/09/20 0925 05/10/20 0154 05/10/20 2359 05/12/20 0752  WBC 10.4  --  10.0  --   --   --  9.2 15.0* 9.6  NEUTROABS  --   --   --   --   --   --   --  11.1*  --   HGB 14.2   < > 14.6   < > 14.6 13.9 14.7 14.8 13.5  HCT 43.5   < > 44.8   < > 43.0 41.0 45.5 46.3* 42.4  MCV 84.1  --  83.7  --   --   --  83.8 84.8 85.0  PLT 229  --  215  --   --   --  218 219 216   < > = values in this interval not displayed.   Cardiac Enzymes: No results for input(s): CKTOTAL, CKMB, CKMBINDEX, TROPONINI in the last 168 hours. BNP (last 3 results) No results for input(s): PROBNP in the last 8760 hours. CBG: Recent Labs  Lab 05/09/20 2058 05/10/20 0555 05/11/20 1619 05/11/20 2207 05/12/20 0642  GLUCAP 230* 155* 262* 162* 160*   D-Dimer: No results for input(s): DDIMER in the last 72 hours. Hgb A1c: No results for input(s): HGBA1C in the last 72 hours. Lipid Profile: No results for input(s): CHOL, HDL, LDLCALC, TRIG, CHOLHDL, LDLDIRECT in the last 72 hours. Thyroid function studies: No results for input(s): TSH, T4TOTAL, T3FREE, THYROIDAB in the last 72 hours.  Invalid input(s): FREET3 Anemia work up: No results for input(s): VITAMINB12, FOLATE, FERRITIN, TIBC, IRON, RETICCTPCT in the last 72 hours. Sepsis Labs: Recent Labs  Lab 05/09/20 0254 05/10/20 0154 05/10/20 2359 05/12/20 0752  WBC 10.0 9.2 15.0* 9.6    Microbiology Recent Results (from the past 240 hour(s))  MRSA PCR Screening     Status: None   Collection  Time: 05/05/20  1:23 AM   Specimen: Nasal Mucosa; Nasopharyngeal  Result Value Ref Range Status   MRSA by PCR NEGATIVE NEGATIVE Final    Comment:        The GeneXpert MRSA Assay (FDA approved for NASAL specimens only), is one component of a comprehensive MRSA colonization surveillance program. It is not intended to diagnose MRSA infection nor to guide or monitor treatment for MRSA infections. Performed at [facility], [address], [place], [place] [address]   SARS Coronavirus 2 by RT PCR (hospital order, performed in [facility] hospital lab) Nasopharyngeal Nasopharyngeal Swab     Status: None   Collection Time: 05/05/20  5:35 PM   Specimen: Nasopharyngeal Swab  Result Value Ref Range Status  SARS Coronavirus 2 NEGATIVE NEGATIVE Final    Comment: (NOTE) SARS-CoV-2 target nucleic acids are NOT DETECTED. The SARS-CoV-2 RNA is generally detectable in upper and lower respiratory specimens during the acute phase of infection. The lowest concentration of SARS-CoV-2 viral copies this assay can detect is 250 copies / mL. A negative result does not preclude SARS-CoV-2 infection and should not be used as the sole basis for treatment or other patient management decisions.  A negative result may occur with improper specimen collection / handling, submission of specimen other than nasopharyngeal swab, presence of viral mutation(s) within the areas targeted by this assay, and inadequate number of viral copies (<250 copies / mL). A negative result must be combined with clinical observations, patient history, and epidemiological information. Fact Sheet for Patients:   BoilerBrush.com.cy Fact Sheet for Healthcare Providers: https://pope.com/ This test is not yet approved or cleared  by the [place] FDA and has been authorized for detection and/or diagnosis of SARS-CoV-2 by FDA under an Emergency Use Authorization (EUA).  This EUA  will remain in effect (meaning this test can be used) for the duration of the COVID-19 declaration under Section 564(b)(1) of the Act, 21 U.S.C. section 360bbb-3(b)(1), unless the authorization is terminated or revoked sooner. Performed at [facility], [address], [place], [place] [address]   SARS Coronavirus 2 by RT PCR (hospital order, performed in [facility] hospital lab) Nasopharyngeal Nasopharyngeal Swab     Status: None   Collection Time: 05/11/20 12:49 AM   Specimen: Nasopharyngeal Swab  Result Value Ref Range Status   SARS Coronavirus 2 NEGATIVE NEGATIVE Final    Comment: (NOTE) SARS-CoV-2 target nucleic acids are NOT DETECTED. The SARS-CoV-2 RNA is generally detectable in upper and lower respiratory specimens during the acute phase of infection. The lowest concentration of SARS-CoV-2 viral copies this assay can detect is 250 copies / mL. A negative result does not preclude SARS-CoV-2 infection and should not be used as the sole basis for treatment or other patient management decisions.  A negative result may occur with improper specimen collection / handling, submission of specimen other than nasopharyngeal swab, presence of viral mutation(s) within the areas targeted by this assay, and inadequate number of viral copies (<250 copies / mL). A negative result must be combined with clinical observations, patient history, and epidemiological information. Fact Sheet for Patients:   BoilerBrush.com.cy Fact Sheet for Healthcare Providers: https://pope.com/ This test is not yet approved or cleared  by the [place] FDA and has been authorized for detection and/or diagnosis of SARS-CoV-2 by FDA under an Emergency Use Authorization (EUA).  This EUA will remain in effect (meaning this test can be used) for the duration of the COVID-19 declaration under Section 564(b)(1) of the Act, 21 U.S.C. section 360bbb-3(b)(1), unless  the authorization is terminated or revoked sooner. Performed at [facility], [address], [place], [place] [address]   Urine culture     Status: Abnormal   Collection Time: 05/11/20  2:20 AM   Specimen: Urine, Random  Result Value Ref Range Status   Specimen Description URINE, RANDOM  Final   Special Requests   Final    NONE Performed at [facility], [address], [place], [place] [address]    Culture MULTIPLE SPECIES PRESENT, SUGGEST RECOLLECTION (A)  Final   Report Status 05/11/2020 FINAL  Final    Procedures and diagnostic studies:  DG Orthopantogram  Result Date: 05/11/2020 CLINICAL DATA:  Poor dentition. EXAM:  ORTHOPANTOGRAM/PANORAMIC COMPARISON:  None. FINDINGS: There are multiple missing teeth with caries involving 3 of the residual 7 mandibular teeth. Metallic restorations are present in 3 of the other teeth. No periapical lucency. Bone of the mandible is otherwise normal. Maxilla is normal. Maxillary sinuses are clear. IMPRESSION: Multiple missing teeth with caries involving 3 of the residual 7 mandibular teeth high. Electronically Signed   By: Francene Boyers M.D.   On: 05/11/2020 16:38   DG Chest 1 View  Result Date: 05/10/2020 CLINICAL DATA:  74 year old female chest pain. EXAM: CHEST  1 VIEW COMPARISON:  Chest radiograph dated 05/05/2020. FINDINGS: No focal consolidation, pleural effusion, pneumothorax. The cardiac silhouette is within normal limits. Coronary vascular calcification. No acute osseous pathology. IMPRESSION: No focal consolidation. Electronically Signed   By: Elgie Collard M.D.   On: 05/10/2020 23:54   CT Head Wo Contrast  Result Date: 05/10/2020 CLINICAL DATA:  Recent fall EXAM: CT HEAD WITHOUT CONTRAST CT CERVICAL SPINE WITHOUT CONTRAST TECHNIQUE: Multidetector CT imaging of the head and cervical spine was performed following the standard protocol without intravenous contrast. Multiplanar CT image reconstructions of the cervical spine were  also generated. COMPARISON:  12/19/2014 FINDINGS: CT HEAD FINDINGS Brain: Mild atrophic changes and chronic white matter ischemic change is seen. No acute hemorrhage, acute infarction or space-occupying mass lesion are noted. Vascular: No hyperdense vessel or unexpected calcification. Skull: Normal. Negative for fracture or focal lesion. Sinuses/Orbits: No acute finding. Other: None. CT CERVICAL SPINE FINDINGS Alignment: Within normal limits. Skull base and vertebrae: 7 cervical segments are well visualized. Vertebral body height is well maintained. Disc space narrowing is noted at C5-6 and C6-7 associated osteophytic changes. Mild facet hypertrophic changes are seen as well at multiple levels. No acute fracture or acute facet abnormality is noted. Soft tissues and spinal canal: Scattered small hypodensities within the thyroid are noted. These are less than 1 cm in size. Remaining soft tissue structures are within normal limits. Upper chest: Visualized lung apices are within normal limits. Other: None IMPRESSION: CT of the head: Chronic atrophic and ischemic changes without acute abnormality. CT of the cervical spine: Multilevel degenerative change without acute abnormality. Scattered small hypodensities within the thyroid. No followup recommended (ref: J Am Coll Radiol. 2015 Feb;12(2): 143-50). Electronically Signed   By: Alcide Clever M.D.   On: 05/10/2020 23:53   CT Angio Chest PE W/Cm &/Or Wo Cm  Result Date: 05/11/2020 CLINICAL DATA:  Shortness of breath and hypoxia EXAM: CT ANGIOGRAPHY CHEST WITH CONTRAST TECHNIQUE: Multidetector CT imaging of the chest was performed using the standard protocol during bolus administration of intravenous contrast. Multiplanar CT image reconstructions and MIPs were obtained to evaluate the vascular anatomy. CONTRAST:  75mL OMNIPAQUE IOHEXOL 350 MG/ML SOLN COMPARISON:  None. FINDINGS: Cardiovascular: There is a optimal opacification of the pulmonary arteries. There is no  central,segmental, or subsegmental filling defects within the pulmonary arteries. The heart is normal in size. No pericardial effusion or thickening. No evidence right heart strain. There is normal three-vessel brachiocephalic anatomy without proximal stenosis. Scattered aortic atherosclerotic calcifications are seen without aneurysmal dilatation. Aortic valve and coronary artery calcifications are seen. Mediastinum/Nodes: No hilar, mediastinal, or axillary adenopathy. There is a partially calcified subcarinal lymph node present. Thyroid gland, trachea, and esophagus demonstrate no significant findings. Lungs/Pleura: Mild bibasilar dependent atelectasis noted. No large airspace consolidation. No pleural effusion or pneumothorax. No airspace consolidation. Upper Abdomen: No acute abnormalities present in the visualized portions of the upper abdomen. Musculoskeletal: No chest wall abnormality.  No acute or significant osseous findings. Review of the MIP images confirms the above findings. IMPRESSION: No central, segmental, or subsegmental pulmonary embolism. No other acute intrathoracic pathology to explain the patient's symptoms. Aortic Atherosclerosis (ICD10-I70.0). Electronically Signed   By: Jonna Clark M.D.   On: 05/11/2020 01:57   CT Cervical Spine Wo Contrast  Result Date: 05/10/2020 CLINICAL DATA:  Recent fall EXAM: CT HEAD WITHOUT CONTRAST CT CERVICAL SPINE WITHOUT CONTRAST TECHNIQUE: Multidetector CT imaging of the head and cervical spine was performed following the standard protocol without intravenous contrast. Multiplanar CT image reconstructions of the cervical spine were also generated. COMPARISON:  12/19/2014 FINDINGS: CT HEAD FINDINGS Brain: Mild atrophic changes and chronic white matter ischemic change is seen. No acute hemorrhage, acute infarction or space-occupying mass lesion are noted. Vascular: No hyperdense vessel or unexpected calcification. Skull: Normal. Negative for fracture or focal  lesion. Sinuses/Orbits: No acute finding. Other: None. CT CERVICAL SPINE FINDINGS Alignment: Within normal limits. Skull base and vertebrae: 7 cervical segments are well visualized. Vertebral body height is well maintained. Disc space narrowing is noted at C5-6 and C6-7 associated osteophytic changes. Mild facet hypertrophic changes are seen as well at multiple levels. No acute fracture or acute facet abnormality is noted. Soft tissues and spinal canal: Scattered small hypodensities within the thyroid are noted. These are less than 1 cm in size. Remaining soft tissue structures are within normal limits. Upper chest: Visualized lung apices are within normal limits. Other: None IMPRESSION: CT of the head: Chronic atrophic and ischemic changes without acute abnormality. CT of the cervical spine: Multilevel degenerative change without acute abnormality. Scattered small hypodensities within the thyroid. No followup recommended (ref: J Am Coll Radiol. 2015 Feb;12(2): 143-50). Electronically Signed   By: Alcide Clever M.D.   On: 05/10/2020 23:53   DG Knee Complete 4 Views Left  Result Date: 05/11/2020 CLINICAL DATA:  Fall, knee pain EXAM: LEFT KNEE - COMPLETE 4+ VIEW COMPARISON:  None. FINDINGS: Advanced tricompartment degenerative changes with joint space narrowing and spurring. Trace associated joint effusion. No acute bony abnormality. Specifically, no fracture, subluxation, or dislocation. IMPRESSION: Advanced degenerative changes and trace joint effusion. No acute bony abnormality. Electronically Signed   By: Charlett Nose M.D.   On: 05/11/2020 00:01   DG Hip Unilat W or Wo Pelvis 2-3 Views Left  Result Date: 05/10/2020 CLINICAL DATA:  Hip pain, fall EXAM: DG HIP (WITH OR WITHOUT PELVIS) 2-3V LEFT COMPARISON:  None. FINDINGS: Hip joints and SI joints are symmetric and unremarkable. No acute bony abnormality. Specifically, no fracture, subluxation, or dislocation. IMPRESSION: No acute bony abnormality.  Electronically Signed   By: Charlett Nose M.D.   On: 05/10/2020 23:58   VAS US CAROTID  Result Date: 05/12/2020 Carotid Arterial Duplex Study Indications:       Pre-TAVR evaluation. Risk Factors:      Hypertension, hyperlipidemia, Diabetes. Other Factors:     Aortic stenosis. Comparison Study:  12/21/2014- carotid artery duplex Performing Technologist: Gertie Fey MHA, RDMS, RVT, RDCS  Examination Guidelines: A complete evaluation includes B-mode imaging, spectral Doppler, color Doppler, and power Doppler as needed of all accessible portions of each vessel. Bilateral testing is considered an integral part of a complete examination. Limited examinations for reoccurring indications may be performed as noted.  Right Carotid Findings: +----------+-------+-------+--------+---------------------------------+--------+           PSV    EDV    StenosisPlaque Description  Comments           cm/s   cm/s                                                     +----------+-------+-------+--------+---------------------------------+--------+ CCA Prox  76     10                                                       +----------+-------+-------+--------+---------------------------------+--------+ CCA Distal91     14             heterogenous, irregular and                                               calcific                                  +----------+-------+-------+--------+---------------------------------+--------+ ICA Prox  102    18             heterogenous, irregular and                                               calcific                                  +----------+-------+-------+--------+---------------------------------+--------+ ICA Distal104    23                                                       +----------+-------+-------+--------+---------------------------------+--------+ ECA       163                   heterogenous, irregular and                                                calcific                                  +----------+-------+-------+--------+---------------------------------+--------+ +----------+--------+-------+----------------+-------------------+           PSV cm/sEDV cmsDescribe        Arm Pressure (mmHG) +----------+--------+-------+----------------+-------------------+ [id number]            Multiphasic, WNL                    +----------+--------+-------+----------------+-------------------+ +---------+--------+--+--------+-+---------+ VertebralPSV cm/s44EDV cm/s7Antegrade +---------+--------+--+--------+-+---------+  Left Carotid Findings: +----------+--------+--------+--------+----------------------------+--------+           PSV cm/sEDV cm/sStenosisPlaque Description  Comments +----------+--------+--------+--------+----------------------------+--------+ CCA Prox  131     8                                                    +----------+--------+--------+--------+----------------------------+--------+ CCA Distal140     15              hyperechoic and heterogenous         +----------+--------+--------+--------+----------------------------+--------+ ICA Prox  108     19                                                   +----------+--------+--------+--------+----------------------------+--------+ ICA Distal93      21                                                   +----------+--------+--------+--------+----------------------------+--------+ +----------+--------+--------+----------------+-------------------+           PSV cm/sEDV cm/sDescribe        Arm Pressure (mmHG) +----------+--------+--------+----------------+-------------------+ [id number]             Multiphasic, WNL                    +----------+--------+--------+----------------+-------------------+ +---------+--------+--+--------+-+---------+ VertebralPSV cm/s40EDV  cm/s7Antegrade +---------+--------+--+--------+-+---------+   Summary: Right Carotid: Velocities in the right ICA are consistent with a 1-39% stenosis. Left Carotid: Velocities in the left ICA are consistent with a 1-39% stenosis. Vertebrals:  Bilateral vertebral arteries demonstrate antegrade flow. Subclavians: Normal flow hemodynamics were seen in bilateral subclavian              arteries. *See table(s) above for measurements and observations.     Preliminary     Medications:   . aspirin EC  81 mg Oral Daily  . atorvastatin  40 mg Oral Daily  . citalopram  20 mg Oral Daily  . enoxaparin (LOVENOX) injection  40 mg Subcutaneous Q24H  . ezetimibe  10 mg Oral Daily  . gabapentin  300 mg Oral TID  . insulin aspart  0-9 Units Subcutaneous TID WC  . insulin aspart protamine- aspart  45 Units Subcutaneous BID WC  . metoprolol tartrate  12.5 mg Oral BID  . pantoprazole  40 mg Oral Daily  . sodium chloride flush  3 mL Intravenous Q12H   Continuous Infusions: . cefTRIAXone (ROCEPHIN)  IV 1 g (05/11/20 1454)     LOS: 1 day   Joseph ArtJessica U Nitika Jackowski  [facility]   How to contact the [facility] Attending or Consulting provider 7A - 7P or covering provider during after hours 7P -7A, for this patient?  1. Check the care team in [facility] and look for a) attending/consulting [facility] provider listed and b) the [facility] team listed 2. Log into [email or web address] and use [facility]'s universal password to access. If you do not have the password, please contact the hospital operator. 3. Locate the [facility] provider you are looking for under [facility] and page to a number that you can be directly reached. 4. If you still have difficulty reaching the provider, please page the [facility] (Director [facility] Call) for the Hospitalists listed  on amion for assistance.  05/12/2020, 11:49 AM

## 2020-05-12 NOTE — Consult Note (Signed)
DENTAL CONSULTATION  Date of Consultation:  05/12/2020 Patient Name:   [name] Date of Birth:   16-Dec-1945 Medical Record Number: [id number]  COVID 19 SCREENING: The patient does not symptoms concerning for COVID-19 infection (Including fever, chills, cough, or new SHORTNESS OF BREATH).    VITALS: BP (!) 127/91 (BP Location: Right Arm)   Pulse 73   Temp 98.3 F (36.8 C) (Oral)   Resp 18   Wt 83.2 kg   SpO2 95%   BMI 31.50 kg/m   CHIEF COMPLAINT: Patient referred by Dr. Laneta SimmersBartle and Dr. Excell Seltzerooper for dental consultation.  HPI: Cynthia LexRuth Frances Hemmelgarn is a 74 year old female recently diagnosed with severe aortic stenosis.  Patient with anticipated aortic valve replacement.  Patient is now seen as part of a medically necessary preheart valve surgery dental protocol examination to rule out dental infection that may affect the patient's systemic health and anticipated heart valve surgery.  The patient currently denies having any acute toothaches, swellings, or abscesses.  Patient was last seen by [facility] for multiple dental extractions and insertion of a maxillary immediate complete denture and lower partial denture.  This was approximately 3 years ago by patient report.  Patient indicates that she no longer wears the upper complete denture and the lower partial denture is ill fitting.  Patient knows that she has several cavities but these are not bothering her by patient report.  Patient denies having dental phobia.  Patient indicates she has not been able to go to the dentist due to economic concerns.   PROBLEM LIST: Patient Active Problem List   Diagnosis Date Noted   Syncope 05/11/2020   Pressure injury of skin 05/11/2020   Orthostatic hypotension 05/11/2020   Dental caries 05/11/2020   Aortic stenosis    Non-ST elevation myocardial infarction (NSTEMI) (HCC) 05/05/2020   NSTEMI (non-ST elevated myocardial infarction) (HCC) 05/05/2020   Diabetic neuropathy (HCC)  11/13/2018   Left knee pain 10/24/2017   Greater trochanteric bursitis of left hip 10/24/2017   AKI (acute kidney injury) (HCC) 12/19/2014   Reactive airway disease 04/09/2014   Seasonal allergic rhinitis 04/09/2014   Preventative health care 04/09/2014   Abnormality of gait 03/17/2013   Irritable bowel syndrome (IBS) 08/27/2012   Type II diabetes mellitus with neurological manifestations, 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   GERD 06/18/2007   Hyperlipidemia associated with type 2 diabetes mellitus (HCC) 12/27/2006   Essential hypertension 09/19/2006   Venous (peripheral) insufficiency 09/19/2006    PMH: Past Medical History:  Diagnosis Date   Achilles tendinitis    Acute renal failure (ARF) (HCC) 12/19/2014   Calcaneal spur    right   Cataract    Depression    Diabetes mellitus    type II   Diverticulosis of colon (without mention of hemorrhage) 2013   GERD (gastroesophageal reflux disease)    GI bleed 03/26/14   Hyperlipidemia    Hypertension    Internal hemorrhoids 2013   Peripheral neuropathy    Right shoulder pain    Subacromial tendinitis   Venous insufficiency    Ventral hernia     PSH: Past Surgical History:  Procedure Laterality Date   ABDOMINAL HYSTERECTOMY  2001   TAH-BSO   CHOLECYSTECTOMY  2001   COLONOSCOPY  2013   diverticulosis    ESOPHAGOGASTRODUODENOSCOPY  2013   normal    INCISIONAL HERNIA REPAIR     RIGHT/LEFT HEART CATH AND CORONARY ANGIOGRAPHY N/A 05/09/2020   Procedure: RIGHT/LEFT HEART CATH AND CORONARY ANGIOGRAPHY;  Surgeon:  Corky Crafts, MD;  Location: [facility] INVASIVE CV LAB;  Service: Cardiovascular;  Laterality: N/A;   TONSILLECTOMY      ALLERGIES: Allergies  Allergen Reactions   Morphine Other (See Comments)    'took me out of this world' I had to be resuscitated   Codeine Sulfate Other (See Comments)    GI upset and pain   Demerol [Meperidine] Nausea And Vomiting   Meperidine Hcl Nausea And Vomiting and Swelling   Propoxyphene  Hcl Other (See Comments)    Darvocet caused sick headache    MEDICATIONS: Current Facility-Administered Medications  Medication Dose Route Frequency Provider Last Rate Last Admin   acetaminophen (TYLENOL) tablet 650 mg  650 mg Oral Q6H PRN Madelyn Flavors A, MD       Or   acetaminophen (TYLENOL) suppository 650 mg  650 mg Rectal Q6H PRN Clydie Braun, MD       aspirin EC tablet 81 mg  81 mg Oral Daily Smith, Rondell A, MD   81 mg at 05/11/20 1302   atorvastatin (LIPITOR) tablet 40 mg  40 mg Oral Daily Smith, Rondell A, MD       cefTRIAXone (ROCEPHIN) 1 g in sodium chloride 0.9 % 100 mL IVPB  1 g Intravenous Q24H Smith, Rondell A, MD 200 mL/hr at 05/11/20 1454 1 g at 05/11/20 1454   citalopram (CELEXA) tablet 20 mg  20 mg Oral Daily Smith, Rondell A, MD   20 mg at 05/11/20 1302   enoxaparin (LOVENOX) injection 40 mg  40 mg Subcutaneous Q24H Smith, Rondell A, MD   40 mg at 05/11/20 1303   ezetimibe (ZETIA) tablet 10 mg  10 mg Oral Daily Katrinka Blazing, Rondell A, MD   10 mg at 05/11/20 1454   gabapentin (NEURONTIN) capsule 300 mg  300 mg Oral TID Madelyn Flavors A, MD   300 mg at 05/11/20 2150   insulin aspart (novoLOG) injection 0-9 Units  0-9 Units Subcutaneous TID WC Smith, Rondell A, MD       insulin aspart protamine- aspart (NOVOLOG MIX 70/30) injection 45 Units  45 Units Subcutaneous BID WC Madelyn Flavors A, MD   45 Units at 05/12/20 0645   metoprolol tartrate (LOPRESSOR) tablet 12.5 mg  12.5 mg Oral BID Madelyn Flavors A, MD   12.5 mg at 05/11/20 2150   naphazoline-glycerin (CLEAR EYES REDNESS) ophth solution 1-2 drop  1-2 drop Both Eyes QID PRN Madelyn Flavors A, MD       pantoprazole (PROTONIX) EC tablet 40 mg  40 mg Oral Daily Smith, Rondell A, MD   40 mg at 05/11/20 1303   sodium chloride flush (NS) 0.9 % injection 3 mL  3 mL Intravenous Q12H Katrinka Blazing, Rondell A, MD   3 mL at 05/11/20 2228    LABS: Lab Results  Component Value Date   WBC 15.0 (H) 05/10/2020   HGB 14.8 05/10/2020   HCT 46.3  (H) 05/10/2020   MCV 84.8 05/10/2020   PLT 219 05/10/2020      Component Value Date/Time   NA 134 (L) 05/10/2020 2359   K 4.5 05/10/2020 2359   CL 95 (L) 05/10/2020 2359   CO2 29 05/10/2020 2359   GLUCOSE 261 (H) 05/10/2020 2359   BUN 33 (H) 05/10/2020 2359   CREATININE 1.14 (H) 05/10/2020 2359   CREATININE 0.63 02/09/2013 1447   CALCIUM 8.9 05/10/2020 2359   GFRNONAA 48 (L) 05/10/2020 2359   GFRNONAA >89 02/09/2013 1447   GFRAA 55 (L) 05/10/2020 2359  GFRAA >89 02/09/2013 1447   Lab Results  Component Value Date   INR 1.00 12/19/2014   No results found for: PTT  SOCIAL HISTORY: Social History   Socioeconomic History   Marital status: Divorced    Spouse name: Not on file   Number of children: 2   Years of education: Not on file   Highest education level: Not on file  Occupational History   Occupation: retired Naval architect, Visual merchandiser, 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 Patient needs to submit further paperwork to complete   [facility]  March 06, 2010 2:35 PM   Financial assistance approved for 100% discount at [facility] [employer] and has [facility] card   Rudell Cobb  March 12, 2010 3:55 PM   Social Determinants of Health   Financial Resource Strain:    Difficulty of Paying Living Expenses:   Food Insecurity:    Worried About Programme researcher, broadcasting/film/video in the Last Year:    Barista in the Last Year:   Transportation Needs:    Freight forwarder (Medical):    Lack of Transportation (Non-Medical):   Physical Activity:    Days of  Exercise per Week:    Minutes of Exercise per Session:   Stress:    Feeling of Stress :   Social Connections:    Frequency of Communication with Friends and Family:    Frequency of Social Gatherings with Friends and Family:    Attends Religious Services:    Active Member of Clubs or Organizations:    Attends Engineer, structural:    Marital Status:   Intimate Partner Violence:    Fear of Current or Ex-Partner:    Emotionally Abused:    Physically Abused:    Sexually Abused:     FAMILY HISTORY: Family History  Problem Relation Age of Onset   Heart failure Father        Died in 32s   CVA Father    Ovarian cancer Sister    Diabetes Sister    Other Sister        died at birth   Heart failure Mother        Died in 53s   Other Brother        drowned   Diabetes Paternal Grandmother    Breast cancer Paternal Aunt    Colon cancer Neg Hx     REVIEW OF SYSTEMS: Reviewed with the patient as per History of present illness. Psych: Patient denies having dental phobia.  DENTAL HISTORY: CHIEF COMPLAINT: Patient referred by Dr. Laneta Simmers and Dr. Excell Seltzer for dental consultation.  HPI: Aqsa Sensabaugh is a 74 year old female recently diagnosed with severe aortic stenosis.  Patient with anticipated aortic valve replacement.  Patient is now seen as part of a medically necessary preheart valve surgery dental protocol examination to rule out dental infection that may affect the patient's  systemic health and anticipated heart valve surgery.  The patient currently denies having any acute toothaches, swellings, or abscesses.  Patient was last seen by [facility] for multiple dental extractions and insertion of a maxillary immediate complete denture and lower partial denture.  This was approximately 3 years ago by patient report.  Patient indicates that she no longer wears the upper complete denture and the lower partial denture is ill fitting.  Patient knows that she has  several cavities but these are not bothering her by patient report.  Patient denies having dental phobia.  Patient indicates she has not been able to go to the dentist due to economic concerns.  DENTAL EXAMINATION: GENERAL: The patient is well-developed, well-nourished female in no acute distress. HEAD AND NECK: There is no palpable neck lymphadenopathy.  The patient denies acute TMJ symptoms. INTRAORAL EXAM: The patient has normal saliva.  There is no evidence of oral abscess formation. DENTITION: Patient is missing all maxillary teeth numbers 1 -16 as well as tooth numbers 17-20, 24-26 and 31-32.  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  There is atrophy of the edentulous alveolar ridges. PERIODONTAL: Patient has chronic periodontitis with plaque accumulations, gingival recession, but no significant tooth mobility.  There is incipient to moderate bone loss noted. DENTAL CARIES/SUBOPTIMAL RESTORATIONS: Patient has dental caries affecting tooth numbers 22, 23, and 27. ENDODONTIC: The patient currently denies acute pulpitis symptoms.  I do not see any evidence of periapical pathology or radiolucency. CROWN AND BRIDGE: There are no crown or bridge restorations. PROSTHODONTIC: Patient has an upper complete denture and a lower partial denture.  Patient did not bring the dentures with her to the hospital.  Patient indicates that she no longer wears the upper complete denture and lower partial denture due to being ill fitting.  These were fabricated approximately 3 years ago at [facility]. OCCLUSION: The patient has a poor occlusal scheme secondary to multiple missing teeth, multiple diastemas, and lack of replacement of the missing teeth with clinically acceptable dental prostheses.  RADIOGRAPHIC INTERPRETATION: An orthopantogram was taken on 05/11/2020 Patient is missing tooth numbers 1-16, 17-20, 24-26, 31, and 32.  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  There is incipient to moderate bone loss noted.  Dental  caries are noted.  There are no obvious periapical radiolucencies noted.  There is pneumatization of the maxillary sinuses.  There is atrophy of the edentulous alveolar ridges.   ASSESSMENTS: 1.  Severe aortic stenosis 2.  Preheart valve surgery dental protocol 3.  Dental caries 4.  Chronic periodontitis with bone loss 5.  Gingival recession 6.  Accretions 7.  Multiple missing teeth 8.  Multiple diastemas 9.  History of ill fitting maxillary complete and lower partial dentures 10.  Poor occlusal scheme and malocclusion 11.  Need for antibiotic premedication prior to invasive dental procedures after the anticipated heart valve surgery per American Heart Association guidelines.   PLAN/RECOMMENDATIONS: 1. I discussed the risks, benefits, and complications of various treatment options with the patient in relationship to her medical and dental conditions, anticipated heart valve surgery, risk for endocarditis.. We discussed various treatment options to include no treatment, total and subtotal extractions with alveoloplasty, pre-prosthetic surgery as indicated, periodontal therapy, dental restorations, root canal therapy, crown and bridge therapy, implant therapy, and replacement of missing teeth as indicated. The patient currently wishes to defer dental treatment at this time and wishes to proceed with anticipated heart valve surgery.  Patient will then need to follow-up with the dentist of her choice for dental  restoration of the cavities and evaluation for fabrication of new upper complete and lower partial dentures.  This will need to take place after she is medically stable from her anticipated heart valve surgery.  Patient will require antibiotic premedication prior to invasive dental procedures after the heart valve surgery per American Heart Association guidelines.   2. Discussion of findings with medical team and coordination of future medical and dental care as needed.    Charlynne Pander, DDS

## 2020-05-13 DIAGNOSIS — K089 Disorder of teeth and supporting structures, unspecified: Secondary | ICD-10-CM

## 2020-05-13 LAB — GLUCOSE, CAPILLARY
Glucose-Capillary: 59 mg/dL — ABNORMAL LOW (ref 70–99)
Glucose-Capillary: 80 mg/dL (ref 70–99)
Glucose-Capillary: 167 mg/dL — ABNORMAL HIGH (ref 70–99)
Glucose-Capillary: 171 mg/dL — ABNORMAL HIGH (ref 70–99)
Glucose-Capillary: 205 mg/dL — ABNORMAL HIGH (ref 70–99)

## 2020-05-13 LAB — BASIC METABOLIC PANEL
Anion gap: 10 (ref 5–15)
BUN: 18 mg/dL (ref 8–23)
CO2: 30 mmol/L (ref 22–32)
Calcium: 9.4 mg/dL (ref 8.9–10.3)
Chloride: 98 mmol/L (ref 98–111)
Creatinine, Ser: 0.7 mg/dL (ref 0.44–1.00)
GFR calc Af Amer: >60 mL/min (ref >60)
GFR calc non Af Amer: >60 mL/min (ref >60)
Glucose, Bld: 95 mg/dL (ref 70–99)
Potassium: 4.3 mmol/L (ref 3.5–5.1)
Sodium: 138 mmol/L (ref 135–145)

## 2020-05-13 LAB — CBC
HCT: 44.9 % (ref 36.0–46.0)
Hemoglobin: 14.2 g/dL (ref 12.0–15.0)
MCH: 27.4 pg (ref 26.0–34.0)
MCHC: 31.6 g/dL (ref 30.0–36.0)
MCV: 86.5 fL (ref 80.0–100.0)
Platelets: 265 10*3/uL (ref 150–400)
RBC: 5.19 MIL/uL — ABNORMAL HIGH (ref 3.87–5.11)
RDW: 13.2 % (ref 11.5–15.5)
WBC: 9.2 10*3/uL (ref 4.0–10.5)
nRBC: 0 % (ref 0.0–0.2)

## 2020-05-13 MED ORDER — INSULIN ASPART PROT & ASPART (70-30 MIX) 100 UNIT/ML ~~LOC~~ SUSP
25.0000 [IU] | Freq: Two times a day (BID) | SUBCUTANEOUS | Status: DC
  Administered 2020-05-13: 25 [IU] via SUBCUTANEOUS

## 2020-05-13 NOTE — Progress Notes (Signed)
Hypoglycemic Event  CBG: 59  Treatment: 4 oz juice/soda  Symptoms: Sweaty  Follow-up CBG: Time:0705 CBG Result: 80  Possible Reasons for Event: Inadequate meal intake  Comments/MD notified treated pt per protocol and gave pt breakfast     Hermine Messick

## 2020-05-13 NOTE — TOC Progression Note (Signed)
Transition of Care [facility]) - Progression Note    Patient Details  Name: [name] MRN: [id number] Date of Birth: 1945/12/21  [facility] of [facility]) CM/SW Contact  Annalee Genta, LCSW Phone Number: 05/13/2020, 1:06 PM  Clinical Narrative: CSW reviewed bed offers with patient and provided patient with Medicare.gov SNF facility ratings. CSW notes patient selected [facility]. CSW confirmed with facility they can receive patient with insurance authorization is attained. TOC notes this will likely be Monday at this time. TOC will update treatment team when authorization is attained.      Expected Discharge Plan: Skilled Nursing Facility Barriers to Discharge: English as a second language teacher, Continued Medical Work up  Expected Discharge Plan and Services Expected Discharge Plan: Skilled Nursing Facility       Living arrangements for the past 2 months: Single Family Home                                       Social Determinants of Health (SDOH) Interventions    Readmission Risk Interventions No flowsheet data found.

## 2020-05-13 NOTE — Progress Notes (Signed)
Progress Note    [name]  WUJ:[id number] DOB: [birth year]  DOA: 05/10/2020 PCP: Myrlene Broker, MD    Brief Narrative:     Medical records reviewed and are as summarized below:  [name] is an 74 y.o. female with medical history significant of hypertension, hyperlipidemia, CAD, severe aortic stenosis, diabetes mellitus type 2, and chronic back pain who presents after having a fall with loss of consciousness.  She had just been recently hospitalized from 5/28-6/2 with complaints of chest pain with NSTEMI.  She underwent cardiac cath which showed nonobstructive disease.  Since leaving the hospital patient reports that she has been constantly dizzy.  She is woken up on the floor at least 3-4 times.  Denies having any chest pain or shortness of breath.  She had been nauseated 3 to 4 days prior to coming into the hospital initially on 5/28, but reports that those symptoms have now resolved.  Assessment/Plan:   Principal Problem:   Syncope Active Problems:   Type II diabetes mellitus with neurological manifestations, uncontrolled (HCC)   Hyperlipidemia associated with type 2 diabetes mellitus (HCC)   Essential hypertension   AKI (acute kidney injury) (HCC)   Aortic stenosis   Pressure injury of skin   Orthostatic hypotension   Dental caries   Syncope Orthostatic hypotension: Acute.  Patient presents after having multiple syncopal episodes at home.  CTA of the chest did not note any signs of a pulmonary embolus.  Patient denies any complaints of chest pain.  Orthostatic vital signs noted to be positive.  Suspect patient has likely been over diuresed during last hospitalization. 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  She reports having dysuria.  UA positive for signs of infection.  -Urine culture: multiple species -Rocephin IV x3 day  Acute kidney injury:  -resolved  Aortic stenosis: Cardiology would  like to have CT angiogram studies of the heart for better characterization of the patient's aortic stenosis.  As well as of the chest abdomen and pelvis.   -per cards  Essential hypertension:  -per cards  Dental caries: Patient noted to have multiple missing teeth with dental caries involving 3 of the residual segment mandibular teeth. -Patient need to follow up with dentist at discharge  Diabetes mellitus type 2: On admission blood sugar elevated to 261.  Home medications include Metformin 500 mg twice daily and Humalog75/25 mix 45 units twice daily -had episode of hypoglycemia overnight, adjust insulin  Dyslipidemia: Home medications include Zetia 10 mg daily and atorvastatin 40 mg daily. -Continue current home regimen  GERD: Home medications include Nexium 40 mg daily. -Continue pharmacy substitution of Protonix  obesity Body mass index is 31.37 kg/m.   Family Communication/Anticipated D/C date and plan/Code Status   DVT prophylaxis: Lovenox ordered. Code Status: Full Code.  Disposition Plan: Status is: Inpatient  Remains inpatient appropriate because:Unsafe d/c plan   Dispo: The patient is from: Home              Anticipated d/c is to: SNF              Anticipated d/c date is: 2 days              Patient currently is not medically stable to d/c.         Medical Consultants:    cards  Subjective:   Patient c/o dizziness-- at rest and with movement  Objective:    Vitals:  05/12/20 1835 05/12/20 2214 05/12/20 2351 05/13/20 0557  BP:  117/63 (!) 125/45 (!) 125/55  Pulse:  77 74 70  Resp:   17 16  Temp:   99 F (37.2 C) 98.3 F (36.8 C)  TempSrc:   Oral Oral  SpO2:   91% 90%  Weight:    82.9 kg  Height:  (1.626 m)      No intake or output data in the 24 hours ending 05/13/20 0952 Filed Weights   05/12/20 0500 05/13/20 0557  Weight: 83.2 kg 82.9 kg    Exam:  General: Appearance:    Obese female in no acute distress  Eyes:     PERRL, conjunctiva/corneas clear, EOM's intact       Lungs:     Clear to auscultation bilaterally, respirations unlabored  Heart:    Normal heart rate. Normal rhythm.  +murmur, +LE edema  MS:   All extremities are intact.   Neurologic:   Awake, alert, oriented x 3. No apparent focal neurological           defect.      Data Reviewed:   I have personally reviewed following labs and imaging studies:  Labs: Labs show the following:   Basic Metabolic Panel: Recent Labs  Lab 05/08/20 0236 05/08/20 0236 05/09/20 0981 05/09/20 1914 05/09/20 0735 05/09/20 0907 05/09/20 0907 05/09/20 0925 05/09/20 0925 05/10/20 2359 05/10/20 2359 05/12/20 0752 05/13/20 0708  NA 137   < > 138   < >  --  139  --  138  --  134*  --  136 138  K 4.9   < > 3.1*   < >  --  3.5   < > 3.4*   < > 4.5   < > 4.2 4.3  CL 96*  --  98  --   --   --   --   --   --  95*  --  99 98  CO2 33*  --  30  --   --   --   --   --   --  29  --  27 30  GLUCOSE 215*  --  135*  --   --   --   --   --   --  261*  --  142* 95  BUN 21  --  17  --   --   --   --   --   --  33*  --  17 18  CREATININE 1.10*  --  0.75  --   --   --   --   --   --  1.14*  --  0.70 0.70  CALCIUM 8.6*  --  8.5*  --   --   --   --   --   --  8.9  --  9.1 9.4  MG  --   --   --   --  1.7  --   --   --   --   --   --   --   --    < > = values in this interval not displayed.   GFR Estimated Creatinine Clearance: 65.3 mL/min (by C-G formula based on SCr of 0.7 mg/dL). Liver Function Tests: Recent Labs  Lab 05/10/20 2359  AST 44*  ALT 46*  ALKPHOS 179*  BILITOT 0.5  PROT 7.4  ALBUMIN 3.2*   No results for input(s): LIPASE, AMYLASE in the last 168 hours.  No results for input(s): AMMONIA in the last 168 hours. Coagulation profile No results for input(s): INR, PROTIME in the last 168 hours.  CBC: Recent Labs  Lab 05/09/20 0254 05/09/20 0907 05/09/20 0925 05/10/20 0154 05/10/20 2359 05/12/20 0752 05/13/20 0708  WBC 10.0  --   --  9.2 15.0*  9.6 9.2  NEUTROABS  --   --   --   --  11.1*  --   --   HGB 14.6   < > 13.9 14.7 14.8 13.5 14.2  HCT 44.8   < > 41.0 45.5 46.3* 42.4 44.9  MCV 83.7  --   --  83.8 84.8 85.0 86.5  PLT 215  --   --  218 219 216 265   < > = values in this interval not displayed.   Cardiac Enzymes: No results for input(s): CKTOTAL, CKMB, CKMBINDEX, TROPONINI in the last 168 hours. BNP (last 3 results) No results for input(s): PROBNP in the last 8760 hours. CBG: Recent Labs  Lab 05/12/20 1604 05/12/20 2117 05/12/20 2209 05/13/20 0644 05/13/20 0705  GLUCAP 222* 67* 114* 59* 80   D-Dimer: No results for input(s): DDIMER in the last 72 hours. Hgb A1c: No results for input(s): HGBA1C in the last 72 hours. Lipid Profile: No results for input(s): CHOL, HDL, LDLCALC, TRIG, CHOLHDL, LDLDIRECT in the last 72 hours. Thyroid function studies: No results for input(s): TSH, T4TOTAL, T3FREE, THYROIDAB in the last 72 hours.  Invalid input(s): FREET3 Anemia work up: No results for input(s): VITAMINB12, FOLATE, FERRITIN, TIBC, IRON, RETICCTPCT in the last 72 hours. Sepsis Labs: Recent Labs  Lab 05/10/20 0154 05/10/20 2359 05/12/20 0752 05/13/20 0708  WBC 9.2 15.0* 9.6 9.2    Microbiology Recent Results (from the past 240 hour(s))  MRSA PCR Screening     Status: None   Collection Time: 05/05/20  1:23 AM   Specimen: Nasal Mucosa; Nasopharyngeal  Result Value Ref Range Status   MRSA by PCR NEGATIVE NEGATIVE Final    Comment:        The GeneXpert MRSA Assay (FDA approved for NASAL specimens only), is one component of a comprehensive MRSA colonization surveillance program. It is not intended to diagnose MRSA infection nor to guide or monitor treatment for MRSA infections. Performed at [facility], [place] [address], [place], [place] [address]   SARS Coronavirus 2 by RT PCR (hospital order, performed in [facility] hospital lab) Nasopharyngeal Nasopharyngeal Swab     Status: None   Collection  Time: 05/05/20  5:35 PM   Specimen: Nasopharyngeal Swab  Result Value Ref Range Status   SARS Coronavirus 2 NEGATIVE NEGATIVE Final    Comment: (NOTE) SARS-CoV-2 target nucleic acids are NOT DETECTED. The SARS-CoV-2 RNA is generally detectable in upper and lower respiratory specimens during the acute phase of infection. The lowest concentration of SARS-CoV-2 viral copies this assay can detect is 250 copies / mL. A negative result does not preclude SARS-CoV-2 infection and should not be used as the sole basis for treatment or other patient management decisions.  A negative result may occur with improper specimen collection / handling, submission of specimen other than nasopharyngeal swab, presence of viral mutation(s) within the areas targeted by this assay, and inadequate number of viral copies (<250 copies / mL). A negative result must be combined with clinical observations, patient history, and epidemiological information. Fact Sheet for Patients:   StrictlyIdeas.no Fact Sheet for Healthcare Providers: BankingDealers.co.za This test is not yet approved or cleared  by the [place] and has been authorized for detection and/or diagnosis of SARS-CoV-2 by FDA under an Emergency Use Authorization (EUA).  This EUA will remain in effect (meaning this test can be used) for the duration of the COVID-19 declaration under Section 564(b)(1) of the Act, 21 U.S.C. section 360bbb-3(b)(1), unless the authorization is terminated or revoked sooner. Performed at [facility], [address], [place], [place] [address]   SARS Coronavirus 2 by RT PCR (hospital order, performed in [facility] hospital lab) Nasopharyngeal Nasopharyngeal Swab     Status: None   Collection Time: 05/11/20 12:49 AM   Specimen: Nasopharyngeal Swab  Result Value Ref Range Status   SARS Coronavirus 2 NEGATIVE NEGATIVE Final    Comment: (NOTE) SARS-CoV-2 target  nucleic acids are NOT DETECTED. The SARS-CoV-2 RNA is generally detectable in upper and lower respiratory specimens during the acute phase of infection. The lowest concentration of SARS-CoV-2 viral copies this assay can detect is 250 copies / mL. A negative result does not preclude SARS-CoV-2 infection and should not be used as the sole basis for treatment or other patient management decisions.  A negative result may occur with improper specimen collection / handling, submission of specimen other than nasopharyngeal swab, presence of viral mutation(s) within the areas targeted by this assay, and inadequate number of viral copies (<250 copies / mL). A negative result must be combined with clinical observations, patient history, and epidemiological information. Fact Sheet for Patients:   BoilerBrush.com.cy Fact Sheet for Healthcare Providers: https://pope.com/ This test is not yet approved or cleared  by the [place] FDA and has been authorized for detection and/or diagnosis of SARS-CoV-2 by FDA under an Emergency Use Authorization (EUA).  This EUA will remain in effect (meaning this test can be used) for the duration of the COVID-19 declaration under Section 564(b)(1) of the Act, 21 U.S.C. section 360bbb-3(b)(1), unless the authorization is terminated or revoked sooner. Performed at [facility], [address], [place], [place] [address]   Urine culture     Status: Abnormal   Collection Time: 05/11/20  2:20 AM   Specimen: Urine, Random  Result Value Ref Range Status   Specimen Description URINE, RANDOM  Final   Special Requests   Final    NONE Performed at [facility], [address], [place], [place] [address]    Culture MULTIPLE SPECIES PRESENT, SUGGEST RECOLLECTION (A)  Final   Report Status 05/11/2020 FINAL  Final    Procedures and diagnostic studies:  DG Orthopantogram  Result Date: 05/11/2020 CLINICAL  DATA:  Poor dentition. EXAM: ORTHOPANTOGRAM/PANORAMIC COMPARISON:  None. FINDINGS: There are multiple missing teeth with caries involving 3 of the residual 7 mandibular teeth. Metallic restorations are present in 3 of the other teeth. No periapical lucency. Bone of the mandible is otherwise normal. Maxilla is normal. Maxillary sinuses are clear. IMPRESSION: Multiple missing teeth with caries involving 3 of the residual 7 mandibular teeth high. Electronically Signed   By: Francene Boyers M.D.   On: 05/11/2020 16:38   CT CORONARY MORPH W/CTA COR W/SCORE W/CA W/CM &/OR WO/CM  Addendum Date: 05/12/2020   ADDENDUM REPORT: 05/12/2020 16:38 CLINICAL DATA:  Severe Aortic Stenosis. EXAM: Cardiac TAVR CT TECHNIQUE: The patient was scanned on a [employer]. A 120 kV retrospective scan was triggered in the descending thoracic aorta at 111 HU's. Gantry rotation speed was 250 msecs and collimation was .6 mm. 10 mg IV metoprolol was given. No nitroglycerin  was given. The 3D data set was reconstructed in 5% intervals of the R-R cycle. Systolic and diastolic phases were analyzed on a dedicated work station using MPR, MIP and VRT modes. The patient received 80 cc of contrast. FINDINGS: Image quality: Excellent. Noise artifact is: Limited. Valve Morphology: The aortic valve is tricuspid. There leaflets are severely calcified with restricted leaflet motion in systole. The RCC contains bulky calcifications extending from the leaflet tip to the base of the leaflet. Aortic Valve Calcium score: 1733 Aortic annular dimension: Phase assessed: 25% Annular area: 421 mm2 Annular perimeter: 74.2 mm Max diameter: 26.0 mm Min diameter: 21.2 mm Annular and subannular calcification: No significant annular or subannular calcifications. Optimal coplanar projection: RAO 5, CRA 3 Coronary Artery Height above Annulus: Left Main: 12.9 mm Right Coronary: 15.4 mm Sinus of Valsalva Measurements: Non-coronary: 27 mm Right-coronary: 27 mm  Left-coronary: 27 mm Sinus of Valsalva Height: Non-coronary: 19.0 mm Right-coronary: 19.8 mm Left-coronary: 19.1 mm Sinotubular Junction: 25 mm.  Mild calcified plaque. Ascending Thoracic Aorta: 32 mm.  Minimal calcified plaque. Coronary Arteries: Normal coronary origin. Right dominance. The study was performed without use of NTG and is insufficient for plaque evaluation. Please refer to recent cardiac catheterization for coronary assessment. Cardiac Morphology: Right Atrium: Right atrial size is within normal limits. Right Ventricle: The right ventricular cavity is within normal limits. Left Atrium: Left atrial size is normal in size with no left atrial appendage filling defect. Left Ventricle: The ventricular cavity size is within normal limits. There are no stigmata of prior infarction. There is no abnormal filling defect. Hyperdynamic LV function, LVEF=79%. No regional wall motion abnormalities. Pulmonary arteries: Normal in size without proximal filling defect. Pulmonary veins: Normal pulmonary venous drainage. Pericardium: Normal thickness with no significant effusion or calcium present. Mitral Valve: The mitral valve is normal structure with mild mitral annular calcification. Extra-cardiac findings: See attached radiology report for non-cardiac structures. IMPRESSION: 1. Annular measurements appropriate for 23 mm Sapien 3 TAVR (421 mm2). 2. No significant annular or subannular calcifications. 3. Sufficient coronary to annulus distance. 4. Optimal Fluoroscopic Angle for Delivery: RAO 5 CRA 3 [place] T. Flora Lipps, MD Electronically Signed   By: Lennie Odor   On: 05/12/2020 16:38   Result Date: 05/12/2020 EXAM: OVER-READ INTERPRETATION  CT CHEST The following report is an over-read performed by radiologist Dr. Trudie Reed of [facility] on 05/12/2020. This over-read does not include interpretation of cardiac or coronary anatomy or pathology. The coronary calcium score/coronary CTA interpretation  by the cardiologist is attached. COMPARISON:  Chest CTA 05/11/2020. FINDINGS: Extracardiac findings will be described separately under dictation for contemporaneously obtained CTA chest, abdomen and pelvis. IMPRESSION: Please see separate dictation for contemporaneously obtained CTA chest, abdomen and pelvis dated 05/12/2020 for full description of relevant extracardiac findings. Electronically Signed: By: Trudie Reed M.D. On: 05/12/2020 15:53   CT ANGIO CHEST AORTA W/CM & OR WO/CM  Result Date: 05/12/2020 CLINICAL DATA:  74 year old female with history of severe aortic stenosis. Preprocedural study prior to potential transcatheter aortic valve replacement (TAVR) procedure. EXAM: CT ANGIOGRAPHY CHEST, ABDOMEN AND PELVIS TECHNIQUE: Chest CTA 05/11/2020.  CT the abdomen and pelvis 03/27/2014. Multidetector CT imaging through the chest, abdomen and pelvis was performed using the standard protocol during bolus administration of intravenous contrast. Multiplanar reconstructed images and MIPs were obtained and reviewed to evaluate the vascular anatomy. CONTRAST:  OMNIPAQUE IOHEXOL 350 MG/ML SOLN COMPARISON:  None. FINDINGS: CTA CHEST FINDINGS Cardiovascular: Heart size is normal. There is  no significant pericardial fluid, thickening or pericardial calcification. There is aortic atherosclerosis, as well as atherosclerosis of the great vessels of the mediastinum and the coronary arteries, including calcified atherosclerotic plaque in the left main, left anterior descending, left circumflex and right coronary arteries. Thickening calcification of the aortic valve. Calcifications of the mitral annulus. Mediastinum/Lymph Nodes: No pathologically enlarged mediastinal or hilar lymph nodes. Esophagus is unremarkable in appearance. No axillary lymphadenopathy. Lungs/Pleura: No suspicious appearing pulmonary nodules or masses are noted. No acute consolidative airspace disease. No pleural effusions.  Musculoskeletal/Soft Tissues: There are no aggressive appearing lytic or blastic lesions noted in the visualized portions of the skeleton. CTA ABDOMEN AND PELVIS FINDINGS Hepatobiliary: No suspicious cystic or solid hepatic lesions. No intra or extrahepatic biliary ductal dilatation. Status post cholecystectomy. Pancreas: No pancreatic mass. No pancreatic ductal dilatation. No pancreatic or peripancreatic fluid collections or inflammatory changes. Spleen: Unremarkable. Adrenals/Urinary Tract: Bilateral kidneys and adrenal glands are normal in appearance. No hydroureteronephrosis. Urinary bladder is normal in appearance. Stomach/Bowel: Normal appearance of the stomach. No pathologic dilatation of small bowel or colon. Numerous colonic diverticulae are noted, most severe in the descending colon and sigmoid colon, without surrounding inflammatory changes to suggest an acute diverticulitis at this time. Normal appendix. Vascular/Lymphatic: Aortic atherosclerosis, without evidence of aneurysm or dissection in the abdominal or pelvic vasculature. Vascular findings and measurements pertinent to potential TAVR procedure, as detailed below. No lymphadenopathy noted in the abdomen or pelvis. Reproductive: Status post hysterectomy. Ovaries are not confidently identified and may be surgically absent or atrophic. Other: Large umbilical hernia containing only omental fat. No significant volume of ascites. No pneumoperitoneum. Musculoskeletal: Chronic appearing compression fracture at L1 with 25% loss of anterior vertebral body height. There are no aggressive appearing lytic or blastic lesions noted in the visualized portions of the skeleton. VASCULAR MEASUREMENTS PERTINENT TO TAVR: AORTA: Minimal Aortic Diameter-12 x 12 mm Severity of Aortic Calcification-severe RIGHT PELVIS: Right Common Iliac Artery - Minimal Diameter-10.3 x 9.1 mm Tortuosity - mild Calcification-mild Right External Iliac Artery - Minimal Diameter-6.5 x 6.7 mm  Tortuosity - mild Calcification-none Right Common Femoral Artery - Minimal Diameter-7.6 x 7.1 mm Tortuosity - mild Calcification-none LEFT PELVIS: Left Common Iliac Artery - Minimal Diameter-10.1 x 9.9 mm Tortuosity - mild Calcification-mild Left External Iliac Artery - Minimal Diameter-7.2 x 6.6 mm Tortuosity - mild Calcification-none Left Common Femoral Artery - Minimal Diameter-7.3 x 6.9 mm Tortuosity-mild Calcification-mild Review of the MIP images confirms the above findings. IMPRESSION: 1. Vascular findings and measurements pertinent to potential TAVR procedure, as detailed above. 2. Severe thickening calcification of the aortic valve, compatible with reported clinical history of severe aortic stenosis. 3. Aortic atherosclerosis, in addition to left main and 3 vessel coronary artery disease. 4. Severe colonic diverticulosis without evidence of acute diverticulitis at this time. 5. Additional incidental findings, as above. Electronically Signed   By: Trudie Reed M.D.   On: 05/12/2020 17:00   VAS US CAROTID  Result Date: 05/12/2020 Carotid Arterial Duplex Study Indications:       Pre-TAVR evaluation. Risk Factors:      Hypertension, hyperlipidemia, Diabetes. Other Factors:     Aortic stenosis. Comparison Study:  12/21/2014- carotid artery duplex Performing Technologist: Gertie Fey MHA, RDMS, RVT, RDCS  Examination Guidelines: A complete evaluation includes B-mode imaging, spectral Doppler, color Doppler, and power Doppler as needed of all accessible portions of each vessel. Bilateral testing is considered an integral part of a complete examination. Limited examinations for reoccurring indications may be performed  as noted.  Right Carotid Findings: +----------+-------+-------+--------+---------------------------------+--------+           PSV    EDV    StenosisPlaque Description               Comments           cm/s   cm/s                                                      +----------+-------+-------+--------+---------------------------------+--------+ CCA Prox  76     10                                                       +----------+-------+-------+--------+---------------------------------+--------+ CCA Distal91     14             heterogenous, irregular and                                               calcific                                  +----------+-------+-------+--------+---------------------------------+--------+ ICA Prox  102    18             heterogenous, irregular and                                               calcific                                  +----------+-------+-------+--------+---------------------------------+--------+ ICA Distal104    23                                                       +----------+-------+-------+--------+---------------------------------+--------+ ECA       163                   heterogenous, irregular and                                               calcific                                  +----------+-------+-------+--------+---------------------------------+--------+ +----------+--------+-------+----------------+-------------------+           PSV cm/sEDV cmsDescribe        Arm Pressure (mmHG) +----------+--------+-------+----------------+-------------------+ [id number]            Multiphasic, WNL                    +----------+--------+-------+----------------+-------------------+ +---------+--------+--+--------+-+---------+  VertebralPSV cm/s44EDV cm/s7Antegrade +---------+--------+--+--------+-+---------+  Left Carotid Findings: +----------+--------+--------+--------+----------------------------+--------+           PSV cm/sEDV cm/sStenosisPlaque Description          Comments +----------+--------+--------+--------+----------------------------+--------+ CCA Prox  131     8                                                     +----------+--------+--------+--------+----------------------------+--------+ CCA Distal140     15              hyperechoic and heterogenous         +----------+--------+--------+--------+----------------------------+--------+ ICA Prox  108     19                                                   +----------+--------+--------+--------+----------------------------+--------+ ICA Distal93      21                                                   +----------+--------+--------+--------+----------------------------+--------+ +----------+--------+--------+----------------+-------------------+           PSV cm/sEDV cm/sDescribe        Arm Pressure (mmHG) +----------+--------+--------+----------------+-------------------+ [id number]             Multiphasic, WNL                    +----------+--------+--------+----------------+-------------------+ +---------+--------+--+--------+-+---------+ VertebralPSV cm/s40EDV cm/s7Antegrade +---------+--------+--+--------+-+---------+   Summary: Right Carotid: Velocities in the right ICA are consistent with a 1-39% stenosis. Left Carotid: Velocities in the left ICA are consistent with a 1-39% stenosis. Vertebrals:  Bilateral vertebral arteries demonstrate antegrade flow. Subclavians: Normal flow hemodynamics were seen in bilateral subclavian              arteries. *See table(s) above for measurements and observations.  Electronically signed by Fabienne Bruns MD on 05/12/2020 at 7:40:46 PM.    Final    CT Angio Abd/Pel w/ and/or w/o  Result Date: 05/12/2020 CLINICAL DATA:  74 year old female with history of severe aortic stenosis. Preprocedural study prior to potential transcatheter aortic valve replacement (TAVR) procedure. EXAM: CT ANGIOGRAPHY CHEST, ABDOMEN AND PELVIS TECHNIQUE: Chest CTA 05/11/2020.  CT the abdomen and pelvis 03/27/2014. Multidetector CT imaging through the chest, abdomen and pelvis was performed using the standard protocol  during bolus administration of intravenous contrast. Multiplanar reconstructed images and MIPs were obtained and reviewed to evaluate the vascular anatomy. CONTRAST:  OMNIPAQUE IOHEXOL 350 MG/ML SOLN COMPARISON:  None. FINDINGS: CTA CHEST FINDINGS Cardiovascular: Heart size is normal. There is no significant pericardial fluid, thickening or pericardial calcification. There is aortic atherosclerosis, as well as atherosclerosis of the great vessels of the mediastinum and the coronary arteries, including calcified atherosclerotic plaque in the left main, left anterior descending, left circumflex and right coronary arteries. Thickening calcification of the aortic valve. Calcifications of the mitral annulus. Mediastinum/Lymph Nodes: No pathologically enlarged mediastinal or hilar lymph nodes. Esophagus is unremarkable in appearance. No axillary lymphadenopathy. Lungs/Pleura: No suspicious appearing pulmonary nodules or masses are noted. No acute consolidative airspace disease. No pleural effusions. Musculoskeletal/Soft Tissues: There  are no aggressive appearing lytic or blastic lesions noted in the visualized portions of the skeleton. CTA ABDOMEN AND PELVIS FINDINGS Hepatobiliary: No suspicious cystic or solid hepatic lesions. No intra or extrahepatic biliary ductal dilatation. Status post cholecystectomy. Pancreas: No pancreatic mass. No pancreatic ductal dilatation. No pancreatic or peripancreatic fluid collections or inflammatory changes. Spleen: Unremarkable. Adrenals/Urinary Tract: Bilateral kidneys and adrenal glands are normal in appearance. No hydroureteronephrosis. Urinary bladder is normal in appearance. Stomach/Bowel: Normal appearance of the stomach. No pathologic dilatation of small bowel or colon. Numerous colonic diverticulae are noted, most severe in the descending colon and sigmoid colon, without surrounding inflammatory changes to suggest an acute diverticulitis at this time. Normal appendix.  Vascular/Lymphatic: Aortic atherosclerosis, without evidence of aneurysm or dissection in the abdominal or pelvic vasculature. Vascular findings and measurements pertinent to potential TAVR procedure, as detailed below. No lymphadenopathy noted in the abdomen or pelvis. Reproductive: Status post hysterectomy. Ovaries are not confidently identified and may be surgically absent or atrophic. Other: Large umbilical hernia containing only omental fat. No significant volume of ascites. No pneumoperitoneum. Musculoskeletal: Chronic appearing compression fracture at L1 with 25% loss of anterior vertebral body height. There are no aggressive appearing lytic or blastic lesions noted in the visualized portions of the skeleton. VASCULAR MEASUREMENTS PERTINENT TO TAVR: AORTA: Minimal Aortic Diameter-12 x 12 mm Severity of Aortic Calcification-severe RIGHT PELVIS: Right Common Iliac Artery - Minimal Diameter-10.3 x 9.1 mm Tortuosity - mild Calcification-mild Right External Iliac Artery - Minimal Diameter-6.5 x 6.7 mm Tortuosity - mild Calcification-none Right Common Femoral Artery - Minimal Diameter-7.6 x 7.1 mm Tortuosity - mild Calcification-none LEFT PELVIS: Left Common Iliac Artery - Minimal Diameter-10.1 x 9.9 mm Tortuosity - mild Calcification-mild Left External Iliac Artery - Minimal Diameter-7.2 x 6.6 mm Tortuosity - mild Calcification-none Left Common Femoral Artery - Minimal Diameter-7.3 x 6.9 mm Tortuosity-mild Calcification-mild Review of the MIP images confirms the above findings. IMPRESSION: 1. Vascular findings and measurements pertinent to potential TAVR procedure, as detailed above. 2. Severe thickening calcification of the aortic valve, compatible with reported clinical history of severe aortic stenosis. 3. Aortic atherosclerosis, in addition to left main and 3 vessel coronary artery disease. 4. Severe colonic diverticulosis without evidence of acute diverticulitis at this time. 5. Additional incidental  findings, as above. Electronically Signed   By: Trudie Reedaniel  Entrikin M.D.   On: 05/12/2020 17:00    Medications:   . aspirin EC  81 mg Oral Daily  . atorvastatin  40 mg Oral Daily  . citalopram  20 mg Oral Daily  . enoxaparin (LOVENOX) injection  40 mg Subcutaneous Q24H  . ezetimibe  10 mg Oral Daily  . gabapentin  300 mg Oral TID  . insulin aspart  0-9 Units Subcutaneous TID WC  . insulin aspart protamine- aspart  25 Units Subcutaneous BID WC  . metoprolol tartrate  12.5 mg Oral BID  . pantoprazole  40 mg Oral Daily  . sodium chloride flush  3 mL Intravenous Q12H   Continuous Infusions: . cefTRIAXone (ROCEPHIN)  IV 1 g (05/12/20 1317)     LOS: 2 days   Joseph ArtJessica U Olanna Percifield  [facility]   How to contact the [facility] Attending or Consulting provider 7A - 7P or covering provider during after hours 7P -7A, for this patient?  1. Check the care team in [facility] and look for a) attending/consulting [facility] provider listed and b) the [facility] team listed 2. Log into [email or web address] and use [place]'s universal password to access. If  you do not have the password, please contact the hospital operator. 3. Locate the [facility] provider you are looking for under [facility] and page to a number that you can be directly reached. 4. If you still have difficulty reaching the provider, please page the [facility] (Director on Call) for the Hospitalists listed on amion for assistance.  05/13/2020, 9:52 AM

## 2020-05-14 LAB — BASIC METABOLIC PANEL
Anion gap: 9 (ref 5–15)
BUN: 16 mg/dL (ref 8–23)
CO2: 29 mmol/L (ref 22–32)
Calcium: 8.9 mg/dL (ref 8.9–10.3)
Chloride: 101 mmol/L (ref 98–111)
Creatinine, Ser: 0.73 mg/dL (ref 0.44–1.00)
GFR calc Af Amer: >60 mL/min (ref >60)
GFR calc non Af Amer: >60 mL/min (ref >60)
Glucose, Bld: 111 mg/dL — ABNORMAL HIGH (ref 70–99)
Potassium: 4.8 mmol/L (ref 3.5–5.1)
Sodium: 139 mmol/L (ref 135–145)

## 2020-05-14 LAB — GLUCOSE, CAPILLARY
Glucose-Capillary: 73 mg/dL (ref 70–99)
Glucose-Capillary: 176 mg/dL — ABNORMAL HIGH (ref 70–99)
Glucose-Capillary: 202 mg/dL — ABNORMAL HIGH (ref 70–99)
Glucose-Capillary: 268 mg/dL — ABNORMAL HIGH (ref 70–99)

## 2020-05-14 LAB — CBC
HCT: 41.9 % (ref 36.0–46.0)
Hemoglobin: 13.2 g/dL (ref 12.0–15.0)
MCH: 27 pg (ref 26.0–34.0)
MCHC: 31.5 g/dL (ref 30.0–36.0)
MCV: 85.9 fL (ref 80.0–100.0)
Platelets: 239 10*3/uL (ref 150–400)
RBC: 4.88 MIL/uL (ref 3.87–5.11)
RDW: 13.2 % (ref 11.5–15.5)
WBC: 8.1 10*3/uL (ref 4.0–10.5)
nRBC: 0 % (ref 0.0–0.2)

## 2020-05-14 LAB — TSH: TSH: 4 u[IU]/mL (ref 0.350–4.500)

## 2020-05-14 MED ORDER — INSULIN ASPART 100 UNIT/ML ~~LOC~~ SOLN
0.0000 [IU] | Freq: Every day | SUBCUTANEOUS | Status: DC
  Administered 2020-05-14: 3 [IU] via SUBCUTANEOUS
  Administered 2020-05-15: 2 [IU] via SUBCUTANEOUS
  Administered 2020-05-16: 4 [IU] via SUBCUTANEOUS
  Administered 2020-05-17: 0 [IU] via SUBCUTANEOUS
  Administered 2020-05-18: 3 [IU] via SUBCUTANEOUS

## 2020-05-14 MED ORDER — INSULIN ASPART 100 UNIT/ML ~~LOC~~ SOLN
0.0000 [IU] | Freq: Three times a day (TID) | SUBCUTANEOUS | Status: DC
  Administered 2020-05-14: 5 [IU] via SUBCUTANEOUS
  Administered 2020-05-14 – 2020-05-15 (×2): 3 [IU] via SUBCUTANEOUS
  Administered 2020-05-15 (×2): 5 [IU] via SUBCUTANEOUS
  Administered 2020-05-16: 8 [IU] via SUBCUTANEOUS
  Administered 2020-05-16: 3 [IU] via SUBCUTANEOUS
  Administered 2020-05-16: 8 [IU] via SUBCUTANEOUS
  Administered 2020-05-17: 3 [IU] via SUBCUTANEOUS
  Administered 2020-05-18 (×2): 5 [IU] via SUBCUTANEOUS
  Administered 2020-05-18: 8 [IU] via SUBCUTANEOUS
  Administered 2020-05-19 (×2): 2 [IU] via SUBCUTANEOUS

## 2020-05-14 NOTE — Social Work (Signed)
[employer] initiated.  Verlon Au, MSW, [employer] Clinical Social Worker

## 2020-05-14 NOTE — Progress Notes (Signed)
Progress Note    [name]  FUX:[id number] DOB: [birth year]  DOA: 05/10/2020 PCP: Myrlene Broker, MD    Brief Narrative:     Medical records reviewed and are as summarized below:  [name] is an 74 y.o. female with medical history significant of hypertension, hyperlipidemia, CAD, severe aortic stenosis, diabetes mellitus type 2, and chronic back pain who presents after having a fall with loss of consciousness.  She had just been recently hospitalized from 5/28-6/2 with complaints of chest pain with NSTEMI.  She underwent cardiac cath which showed nonobstructive disease.  Since leaving the hospital patient reports that she has been constantly dizzy.  She is woken up on the floor at least 3-4 times.  Denies having any chest pain or shortness of breath.  She had been nauseated 3 to 4 days prior to coming into the hospital initially on 5/28, but reports that those symptoms have now resolved.   Assessment/Plan:   Principal Problem:   Syncope Active Problems:   Type II diabetes mellitus with neurological manifestations, uncontrolled (HCC)   Hyperlipidemia associated with type 2 diabetes mellitus (HCC)   Essential hypertension   AKI (acute kidney injury) (HCC)   Aortic stenosis   Pressure injury of skin   Orthostatic hypotension   Dental caries   Syncope Orthostatic hypotension: Acute.  Patient presents after having multiple syncopal episodes at home.  CTA of the chest did not note any signs of a pulmonary embolus.  Patient denies any complaints of chest pain.  Orthostatic vital signs noted to be positive.  Suspect patient has likely been over diuresed during last hospitalization. 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  She reports having dysuria.  UA positive for signs of infection.  -Urine culture: multiple species -Rocephin IV x3 days completed  Acute kidney injury:  -resolved  Aortic stenosis:  Cardiology would like to have CT angiogram studies of the heart for better characterization of the patient's aortic stenosis.  As well as of the chest abdomen and pelvis.   -per cards  Essential hypertension:  -per cards  Dental caries: Patient noted to have multiple missing teeth with dental caries involving 3 of the residual segment mandibular teeth. -Patient need to follow up with dentist at discharge  Diabetes mellitus type 2: On admission blood sugar elevated to 261.  Home medications include Metformin 500 mg twice daily and Humalog75/25 mix 45 units twice daily -had episode of hypoglycemia overnight, adjust insulin (d/c 70/30 and just use SSI For now)  Dyslipidemia: Home medications include Zetia 10 mg daily and atorvastatin 40 mg daily. -Continue current home regimen  GERD: Home medications include Nexium 40 mg daily. -Continue pharmacy substitution of Protonix  obesity Body mass index is 31.37 kg/m.   Family Communication/Anticipated D/C date and plan/Code Status   DVT prophylaxis: Lovenox ordered. Code Status: Full Code.  Disposition Plan: Status is: Inpatient  Remains inpatient appropriate because:Unsafe d/c plan   Dispo: The patient is from: Home              Anticipated d/c is to: SNF              Anticipated d/c date is: 1 day              Patient currently is medically stable to d/c.         Medical Consultants:    cards  Subjective:   Wants to get out of bed  Objective:    Vitals:   05/13/20 2204 05/13/20 2329 05/14/20 0437 05/14/20 1119  BP: (!) 134/54 126/80 104/65 134/89  Pulse: 66 66 (!) 58 73  Resp: 18 16 16 18   Temp: 97.9 F (36.6 C) 98.2 F (36.8 C) 98 F (36.7 C) 98.8 F (37.1 C)  TempSrc: Oral Oral Oral Oral  SpO2: 95% 93% 94% 97%  Weight:   82.9 kg   Height:        Intake/Output Summary (Last 24 hours) at 05/14/2020 1150 Last data filed at 05/14/2020 1148 Gross per 24 hour  Intake 1440 ml  Output 900 ml  Net 540 ml    Filed Weights   05/12/20 0500 05/13/20 0557 05/14/20 0437  Weight: 83.2 kg 82.9 kg 82.9 kg    Exam:  General: Appearance:    Obese female in no acute distress  Eyes:    PERRL, conjunctiva/corneas clear, EOM's intact       Lungs:     Clear to auscultation bilaterally, respirations unlabored  Heart:    Normal heart rate. Normal rhythm.  +murmur  MS:   All extremities are intact.   Neurologic:   Awake, alert, oriented x 3. No apparent focal neurological           defect.     Data Reviewed:   I have personally reviewed following labs and imaging studies:  Labs: Labs show the following:   Basic Metabolic Panel: Recent Labs  Lab 05/09/20 0638 05/09/20 0735 05/09/20 0907 05/09/20 0925 05/09/20 0925 05/10/20 2359 05/10/20 2359 05/12/20 0752 05/12/20 0752 05/13/20 0708 05/14/20 0445  NA 138  --    < > 138  --  134*  --  136  --  138 139  K 3.1*  --    < > 3.4*   < > 4.5   < > 4.2   < > 4.3 4.8  CL 98  --   --   --   --  95*  --  99  --  98 101  CO2 30  --   --   --   --  29  --  27  --  30 29  GLUCOSE 135*  --   --   --   --  261*  --  142*  --  95 111*  BUN 17  --   --   --   --  33*  --  17  --  18 16  CREATININE 0.75  --   --   --   --  1.14*  --  0.70  --  0.70 0.73  CALCIUM 8.5*  --   --   --   --  8.9  --  9.1  --  9.4 8.9  MG  --  1.7  --   --   --   --   --   --   --   --   --    < > = values in this interval not displayed.   GFR Estimated Creatinine Clearance: 65.3 mL/min (by C-G formula based on SCr of 0.73 mg/dL). Liver Function Tests: Recent Labs  Lab 05/10/20 2359  AST 44*  ALT 46*  ALKPHOS 179*  BILITOT 0.5  PROT 7.4  ALBUMIN 3.2*   No results for input(s): LIPASE, AMYLASE in the last 168 hours. No results for input(s): AMMONIA in the last 168 hours. Coagulation profile No results for input(s): INR, PROTIME in the last 168 hours.  CBC: Recent Labs  Lab 05/10/20 0154 05/10/20 2359 05/12/20 0752 05/13/20 0708 05/14/20 0445  WBC 9.2  15.0* 9.6 9.2 8.1  NEUTROABS  --  11.1*  --   --   --   HGB 14.7 14.8 13.5 14.2 13.2  HCT 45.5 46.3* 42.4 44.9 41.9  MCV 83.8 84.8 85.0 86.5 85.9  PLT 218 219 216 265 239   Cardiac Enzymes: No results for input(s): CKTOTAL, CKMB, CKMBINDEX, TROPONINI in the last 168 hours. BNP (last 3 results) No results for input(s): PROBNP in the last 8760 hours. CBG: Recent Labs  Lab 05/13/20 0705 05/13/20 1106 05/13/20 1607 05/13/20 2139 05/14/20 0623  GLUCAP 80 167* 171* 205* 73   D-Dimer: No results for input(s): DDIMER in the last 72 hours. Hgb A1c: No results for input(s): HGBA1C in the last 72 hours. Lipid Profile: No results for input(s): CHOL, HDL, LDLCALC, TRIG, CHOLHDL, LDLDIRECT in the last 72 hours. Thyroid function studies: Recent Labs    05/14/20 0445  TSH 4.000   Anemia work up: No results for input(s): VITAMINB12, FOLATE, FERRITIN, TIBC, IRON, RETICCTPCT in the last 72 hours. Sepsis Labs: Recent Labs  Lab 05/10/20 2359 05/12/20 0752 05/13/20 0708 05/14/20 0445  WBC 15.0* 9.6 9.2 8.1    Microbiology Recent Results (from the past 240 hour(s))  MRSA PCR Screening     Status: None   Collection Time: 05/05/20  1:23 AM   Specimen: Nasal Mucosa; Nasopharyngeal  Result Value Ref Range Status   MRSA by PCR NEGATIVE NEGATIVE Final    Comment:        The GeneXpert MRSA Assay (FDA approved for NASAL specimens only), is one component of a comprehensive MRSA colonization surveillance program. It is not intended to diagnose MRSA infection nor to guide or monitor treatment for MRSA infections. Performed at [facility], [address] [place], [place] [address]   SARS Coronavirus 2 by RT PCR (hospital order, performed in [facility] hospital lab) Nasopharyngeal Nasopharyngeal Swab     Status: None   Collection Time: 05/05/20  5:35 PM   Specimen: Nasopharyngeal Swab  Result Value Ref Range Status   SARS Coronavirus 2 NEGATIVE NEGATIVE Final    Comment:  (NOTE) SARS-CoV-2 target nucleic acids are NOT DETECTED. The SARS-CoV-2 RNA is generally detectable in upper and lower respiratory specimens during the acute phase of infection. The lowest concentration of SARS-CoV-2 viral copies this assay can detect is 250 copies / mL. A negative result does not preclude SARS-CoV-2 infection and should not be used as the sole basis for treatment or other patient management decisions.  A negative result may occur with improper specimen collection / handling, submission of specimen other than nasopharyngeal swab, presence of viral mutation(s) within the areas targeted by this assay, and inadequate number of viral copies (<250 copies / mL). A negative result must be combined with clinical observations, patient history, and epidemiological information. Fact Sheet for Patients:   BoilerBrush.com.cy Fact Sheet for Healthcare Providers: https://pope.com/ This test is not yet approved or cleared  by the [place] FDA and has been authorized for detection and/or diagnosis of SARS-CoV-2 by FDA under an Emergency Use Authorization (EUA).  This EUA will remain in effect (meaning this test can be used) for the duration of the COVID-19 declaration under Section 564(b)(1) of the Act, 21 U.S.C. section 360bbb-3(b)(1), unless the authorization is terminated or revoked sooner. Performed at [facility], [address], [place], [place] [address]   SARS  Coronavirus 2 by RT PCR (hospital order, performed in [facility] hospital lab) Nasopharyngeal Nasopharyngeal Swab     Status: None   Collection Time: 05/11/20 12:49 AM   Specimen: Nasopharyngeal Swab  Result Value Ref Range Status   SARS Coronavirus 2 NEGATIVE NEGATIVE Final    Comment: (NOTE) SARS-CoV-2 target nucleic acids are NOT DETECTED. The SARS-CoV-2 RNA is generally detectable in upper and lower respiratory specimens during the acute phase of  infection. The lowest concentration of SARS-CoV-2 viral copies this assay can detect is 250 copies / mL. A negative result does not preclude SARS-CoV-2 infection and should not be used as the sole basis for treatment or other patient management decisions.  A negative result may occur with improper specimen collection / handling, submission of specimen other than nasopharyngeal swab, presence of viral mutation(s) within the areas targeted by this assay, and inadequate number of viral copies (<250 copies / mL). A negative result must be combined with clinical observations, patient history, and epidemiological information. Fact Sheet for Patients:   StrictlyIdeas.no Fact Sheet for Healthcare Providers: BankingDealers.co.za This test is not yet approved or cleared  by the [place] FDA and has been authorized for detection and/or diagnosis of SARS-CoV-2 by FDA under an Emergency Use Authorization (EUA).  This EUA will remain in effect (meaning this test can be used) for the duration of the COVID-19 declaration under Section 564(b)(1) of the Act, 21 U.S.C. section 360bbb-3(b)(1), unless the authorization is terminated or revoked sooner. Performed at [facility], [place] [address] [place], [place] [address]   Urine culture     Status: Abnormal   Collection Time: 05/11/20  2:20 AM   Specimen: Urine, Random  Result Value Ref Range Status   Specimen Description URINE, RANDOM  Final   Special Requests   Final    NONE Performed at [facility], [place] [address], [place],  [address]    Culture MULTIPLE SPECIES PRESENT, SUGGEST RECOLLECTION (A)  Final   Report Status 05/11/2020 FINAL  Final    Procedures and diagnostic studies:  CT CORONARY MORPH W/CTA COR W/SCORE W/CA W/CM &/OR WO/CM  Addendum Date: 05/12/2020   ADDENDUM REPORT: 05/12/2020 16:38 CLINICAL DATA:  Severe Aortic Stenosis. EXAM: Cardiac TAVR CT TECHNIQUE: The  patient was scanned on a [employer]. A 120 kV retrospective scan was triggered in the descending thoracic aorta at 111 HU's. Gantry rotation speed was 250 msecs and collimation was .6 mm. 10 mg IV metoprolol was given. No nitroglycerin was given. The 3D data set was reconstructed in 5% intervals of the R-R cycle. Systolic and diastolic phases were analyzed on a dedicated work station using MPR, MIP and VRT modes. The patient received 80 cc of contrast. FINDINGS: Image quality: Excellent. Noise artifact is: Limited. Valve Morphology: The aortic valve is tricuspid. There leaflets are severely calcified with restricted leaflet motion in systole. The RCC contains bulky calcifications extending from the leaflet tip to the base of the leaflet. Aortic Valve Calcium score: 1733 Aortic annular dimension: Phase assessed: 25% Annular area: 421 mm2 Annular perimeter: 74.2 mm Max diameter: 26.0 mm Min diameter: 21.2 mm Annular and subannular calcification: No significant annular or subannular calcifications. Optimal coplanar projection: RAO 5, CRA 3 Coronary Artery Height above Annulus: Left Main: 12.9 mm Right Coronary: 15.4 mm Sinus of Valsalva Measurements: Non-coronary: 27 mm Right-coronary: 27 mm Left-coronary: 27 mm Sinus of Valsalva Height: Non-coronary: 19.0 mm Right-coronary: 19.8 mm Left-coronary: 19.1 mm Sinotubular Junction: 25  mm.  Mild calcified plaque. Ascending Thoracic Aorta: 32 mm.  Minimal calcified plaque. Coronary Arteries: Normal coronary origin. Right dominance. The study was performed without use of NTG and is insufficient for plaque evaluation. Please refer to recent cardiac catheterization for coronary assessment. Cardiac Morphology: Right Atrium: Right atrial size is within normal limits. Right Ventricle: The right ventricular cavity is within normal limits. Left Atrium: Left atrial size is normal in size with no left atrial appendage filling defect. Left Ventricle: The ventricular cavity  size is within normal limits. There are no stigmata of prior infarction. There is no abnormal filling defect. Hyperdynamic LV function, LVEF=79%. No regional wall motion abnormalities. Pulmonary arteries: Normal in size without proximal filling defect. Pulmonary veins: Normal pulmonary venous drainage. Pericardium: Normal thickness with no significant effusion or calcium present. Mitral Valve: The mitral valve is normal structure with mild mitral annular calcification. Extra-cardiac findings: See attached radiology report for non-cardiac structures. IMPRESSION: 1. Annular measurements appropriate for 23 mm Sapien 3 TAVR (421 mm2). 2. No significant annular or subannular calcifications. 3. Sufficient coronary to annulus distance. 4. Optimal Fluoroscopic Angle for Delivery: RAO 5 CRA 3 [place] T. Flora Lipps'Neal, MD Electronically Signed   By: Lennie OdorWesley  O'Neal   On: 05/12/2020 16:38   Result Date: 05/12/2020 EXAM: OVER-READ INTERPRETATION  CT CHEST The following report is an over-read performed by radiologist Dr. Trudie Reedaniel Entrikin [facility] on 05/12/2020. This over-read does not include interpretation of cardiac or coronary anatomy or pathology. The coronary calcium score/coronary CTA interpretation by the cardiologist is attached. COMPARISON:  Chest CTA 05/11/2020. FINDINGS: Extracardiac findings will be described separately under dictation for contemporaneously obtained CTA chest, abdomen and pelvis. IMPRESSION: Please see separate dictation for contemporaneously obtained CTA chest, abdomen and pelvis dated 05/12/2020 for full description of relevant extracardiac findings. Electronically Signed: By: Trudie Reedaniel  Entrikin M.D. On: 05/12/2020 15:53   CT ANGIO CHEST AORTA W/CM & OR WO/CM  Result Date: 05/12/2020 CLINICAL DATA:  74 year old female with history of severe aortic stenosis. Preprocedural study prior to potential transcatheter aortic valve replacement (TAVR) procedure. EXAM: CT ANGIOGRAPHY CHEST, ABDOMEN  AND PELVIS TECHNIQUE: Chest CTA 05/11/2020.  CT the abdomen and pelvis 03/27/2014. Multidetector CT imaging through the chest, abdomen and pelvis was performed using the standard protocol during bolus administration of intravenous contrast. Multiplanar reconstructed images and MIPs were obtained and reviewed to evaluate the vascular anatomy. CONTRAST:  100mL OMNIPAQUE IOHEXOL 350 MG/ML SOLN COMPARISON:  None. FINDINGS: CTA CHEST FINDINGS Cardiovascular: Heart size is normal. There is no significant pericardial fluid, thickening or pericardial calcification. There is aortic atherosclerosis, as well as atherosclerosis of the great vessels of the mediastinum and the coronary arteries, including calcified atherosclerotic plaque in the left main, left anterior descending, left circumflex and right coronary arteries. Thickening calcification of the aortic valve. Calcifications of the mitral annulus. Mediastinum/Lymph Nodes: No pathologically enlarged mediastinal or hilar lymph nodes. Esophagus is unremarkable in appearance. No axillary lymphadenopathy. Lungs/Pleura: No suspicious appearing pulmonary nodules or masses are noted. No acute consolidative airspace disease. No pleural effusions. Musculoskeletal/Soft Tissues: There are no aggressive appearing lytic or blastic lesions noted in the visualized portions of the skeleton. CTA ABDOMEN AND PELVIS FINDINGS Hepatobiliary: No suspicious cystic or solid hepatic lesions. No intra or extrahepatic biliary ductal dilatation. Status post cholecystectomy. Pancreas: No pancreatic mass. No pancreatic ductal dilatation. No pancreatic or peripancreatic fluid collections or inflammatory changes. Spleen: Unremarkable. Adrenals/Urinary Tract: Bilateral kidneys and adrenal glands are normal in appearance. No hydroureteronephrosis. Urinary bladder  is normal in appearance. Stomach/Bowel: Normal appearance of the stomach. No pathologic dilatation of small bowel or colon. Numerous colonic  diverticulae are noted, most severe in the descending colon and sigmoid colon, without surrounding inflammatory changes to suggest an acute diverticulitis at this time. Normal appendix. Vascular/Lymphatic: Aortic atherosclerosis, without evidence of aneurysm or dissection in the abdominal or pelvic vasculature. Vascular findings and measurements pertinent to potential TAVR procedure, as detailed below. No lymphadenopathy noted in the abdomen or pelvis. Reproductive: Status post hysterectomy. Ovaries are not confidently identified and may be surgically absent or atrophic. Other: Large umbilical hernia containing only omental fat. No significant volume of ascites. No pneumoperitoneum. Musculoskeletal: Chronic appearing compression fracture at L1 with 25% loss of anterior vertebral body height. There are no aggressive appearing lytic or blastic lesions noted in the visualized portions of the skeleton. VASCULAR MEASUREMENTS PERTINENT TO TAVR: AORTA: Minimal Aortic Diameter-12 x 12 mm Severity of Aortic Calcification-severe RIGHT PELVIS: Right Common Iliac Artery - Minimal Diameter-10.3 x 9.1 mm Tortuosity - mild Calcification-mild Right External Iliac Artery - Minimal Diameter-6.5 x 6.7 mm Tortuosity - mild Calcification-none Right Common Femoral Artery - Minimal Diameter-7.6 x 7.1 mm Tortuosity - mild Calcification-none LEFT PELVIS: Left Common Iliac Artery - Minimal Diameter-10.1 x 9.9 mm Tortuosity - mild Calcification-mild Left External Iliac Artery - Minimal Diameter-7.2 x 6.6 mm Tortuosity - mild Calcification-none Left Common Femoral Artery - Minimal Diameter-7.3 x 6.9 mm Tortuosity-mild Calcification-mild Review of the MIP images confirms the above findings. IMPRESSION: 1. Vascular findings and measurements pertinent to potential TAVR procedure, as detailed above. 2. Severe thickening calcification of the aortic valve, compatible with reported clinical history of severe aortic stenosis. 3. Aortic  atherosclerosis, in addition to left main and 3 vessel coronary artery disease. 4. Severe colonic diverticulosis without evidence of acute diverticulitis at this time. 5. Additional incidental findings, as above. Electronically Signed   By: Trudie Reed M.D.   On: 05/12/2020 17:00   CT Angio Abd/Pel w/ and/or w/o  Result Date: 05/12/2020 CLINICAL DATA:  74 year old female with history of severe aortic stenosis. Preprocedural study prior to potential transcatheter aortic valve replacement (TAVR) procedure. EXAM: CT ANGIOGRAPHY CHEST, ABDOMEN AND PELVIS TECHNIQUE: Chest CTA 05/11/2020.  CT the abdomen and pelvis 03/27/2014. Multidetector CT imaging through the chest, abdomen and pelvis was performed using the standard protocol during bolus administration of intravenous contrast. Multiplanar reconstructed images and MIPs were obtained and reviewed to evaluate the vascular anatomy. CONTRAST:  OMNIPAQUE IOHEXOL 350 MG/ML SOLN COMPARISON:  None. FINDINGS: CTA CHEST FINDINGS Cardiovascular: Heart size is normal. There is no significant pericardial fluid, thickening or pericardial calcification. There is aortic atherosclerosis, as well as atherosclerosis of the great vessels of the mediastinum and the coronary arteries, including calcified atherosclerotic plaque in the left main, left anterior descending, left circumflex and right coronary arteries. Thickening calcification of the aortic valve. Calcifications of the mitral annulus. Mediastinum/Lymph Nodes: No pathologically enlarged mediastinal or hilar lymph nodes. Esophagus is unremarkable in appearance. No axillary lymphadenopathy. Lungs/Pleura: No suspicious appearing pulmonary nodules or masses are noted. No acute consolidative airspace disease. No pleural effusions. Musculoskeletal/Soft Tissues: There are no aggressive appearing lytic or blastic lesions noted in the visualized portions of the skeleton. CTA ABDOMEN AND PELVIS FINDINGS Hepatobiliary: No  suspicious cystic or solid hepatic lesions. No intra or extrahepatic biliary ductal dilatation. Status post cholecystectomy. Pancreas: No pancreatic mass. No pancreatic ductal dilatation. No pancreatic or peripancreatic fluid collections or inflammatory changes. Spleen: Unremarkable. Adrenals/Urinary Tract: Bilateral kidneys  and adrenal glands are normal in appearance. No hydroureteronephrosis. Urinary bladder is normal in appearance. Stomach/Bowel: Normal appearance of the stomach. No pathologic dilatation of small bowel or colon. Numerous colonic diverticulae are noted, most severe in the descending colon and sigmoid colon, without surrounding inflammatory changes to suggest an acute diverticulitis at this time. Normal appendix. Vascular/Lymphatic: Aortic atherosclerosis, without evidence of aneurysm or dissection in the abdominal or pelvic vasculature. Vascular findings and measurements pertinent to potential TAVR procedure, as detailed below. No lymphadenopathy noted in the abdomen or pelvis. Reproductive: Status post hysterectomy. Ovaries are not confidently identified and may be surgically absent or atrophic. Other: Large umbilical hernia containing only omental fat. No significant volume of ascites. No pneumoperitoneum. Musculoskeletal: Chronic appearing compression fracture at L1 with 25% loss of anterior vertebral body height. There are no aggressive appearing lytic or blastic lesions noted in the visualized portions of the skeleton. VASCULAR MEASUREMENTS PERTINENT TO TAVR: AORTA: Minimal Aortic Diameter-12 x 12 mm Severity of Aortic Calcification-severe RIGHT PELVIS: Right Common Iliac Artery - Minimal Diameter-10.3 x 9.1 mm Tortuosity - mild Calcification-mild Right External Iliac Artery - Minimal Diameter-6.5 x 6.7 mm Tortuosity - mild Calcification-none Right Common Femoral Artery - Minimal Diameter-7.6 x 7.1 mm Tortuosity - mild Calcification-none LEFT PELVIS: Left Common Iliac Artery - Minimal  Diameter-10.1 x 9.9 mm Tortuosity - mild Calcification-mild Left External Iliac Artery - Minimal Diameter-7.2 x 6.6 mm Tortuosity - mild Calcification-none Left Common Femoral Artery - Minimal Diameter-7.3 x 6.9 mm Tortuosity-mild Calcification-mild Review of the MIP images confirms the above findings. IMPRESSION: 1. Vascular findings and measurements pertinent to potential TAVR procedure, as detailed above. 2. Severe thickening calcification of the aortic valve, compatible with reported clinical history of severe aortic stenosis. 3. Aortic atherosclerosis, in addition to left main and 3 vessel coronary artery disease. 4. Severe colonic diverticulosis without evidence of acute diverticulitis at this time. 5. Additional incidental findings, as above. Electronically Signed   By: Trudie Reed M.D.   On: 05/12/2020 17:00    Medications:   . aspirin EC  81 mg Oral Daily  . atorvastatin  40 mg Oral Daily  . citalopram  20 mg Oral Daily  . enoxaparin (LOVENOX) injection  40 mg Subcutaneous Q24H  . ezetimibe  10 mg Oral Daily  . gabapentin  300 mg Oral TID  . insulin aspart  0-15 Units Subcutaneous TID WC  . insulin aspart  0-5 Units Subcutaneous QHS  . metoprolol tartrate  12.5 mg Oral BID  . pantoprazole  40 mg Oral Daily  . sodium chloride flush  3 mL Intravenous Q12H   Continuous Infusions:    LOS: 3 days   Joseph Art  [facility]   How to contact the [facility] Attending or Consulting provider 7A - 7P or covering provider during after hours 7P -7A, for this patient?  1. Check the care team in [facility] and look for a) attending/consulting TR[facility] provider listed and b) the [facility] team listed 2. Log into [email or web address] and use [place]'s universal password to access. If you do not have the password, please contact the hospital operator. 3. Locate the [facility] provider you are looking for under [facility] and page to a number that you can be directly reached. 4. If you still have difficulty  reaching the provider, please page the [employer] (Director on Call) for the Hospitalists listed on amion for assistance.  05/14/2020, 11:50 AM

## 2020-05-14 NOTE — Social Work (Signed)
[name] DOB 03-Dec-1946   To Whom It May Concern:  Please be advised that the above-named patient will require a short-term nursing home stay - anticipated 30 days or less for rehabilitation and strengthening.  The plan is for return home.

## 2020-05-15 LAB — GLUCOSE, CAPILLARY
Glucose-Capillary: 165 mg/dL — ABNORMAL HIGH (ref 70–99)
Glucose-Capillary: 233 mg/dL — ABNORMAL HIGH (ref 70–99)
Glucose-Capillary: 210 mg/dL — ABNORMAL HIGH (ref 70–99)
Glucose-Capillary: 230 mg/dL — ABNORMAL HIGH (ref 70–99)

## 2020-05-15 MED ORDER — INSULIN ASPART PROT & ASPART (70-30 MIX) 100 UNIT/ML ~~LOC~~ SUSP
15.0000 [IU] | Freq: Two times a day (BID) | SUBCUTANEOUS | Status: DC
  Administered 2020-05-15 – 2020-05-18 (×4): 15 [IU] via SUBCUTANEOUS
  Filled 2020-05-15 (×2): qty 10

## 2020-05-15 MED ORDER — ALUM & MAG HYDROXIDE-SIMETH 200-200-20 MG/5ML PO SUSP
15.0000 mL | Freq: Four times a day (QID) | ORAL | Status: DC | PRN
  Filled 2020-05-15: qty 30

## 2020-05-15 NOTE — Progress Notes (Addendum)
[facility]   MULTIDISCIPLINARY HEART VALVE TEAM  Patient Name: [name] Date of Encounter: 05/15/2020  Primary Cardiologist: Dr. Williemae Area Problem List     Principal Problem:   Syncope Active Problems:   Type II diabetes mellitus with neurological manifestations, uncontrolled (HCC)   Hyperlipidemia associated with type 2 diabetes mellitus (HCC)   Essential hypertension   AKI (acute kidney injury) (HCC)   Aortic stenosis   Pressure injury of skin   Orthostatic hypotension   Dental caries     Subjective   No complaints. Anxious for surgery.   Inpatient Medications    Scheduled Meds: . aspirin EC  81 mg Oral Daily  . atorvastatin  40 mg Oral Daily  . citalopram  20 mg Oral Daily  . enoxaparin (LOVENOX) injection  40 mg Subcutaneous Q24H  . ezetimibe  10 mg Oral Daily  . gabapentin  300 mg Oral TID  . insulin aspart  0-15 Units Subcutaneous TID WC  . insulin aspart  0-5 Units Subcutaneous QHS  . metoprolol tartrate  12.5 mg Oral BID  . pantoprazole  40 mg Oral Daily  . sodium chloride flush  3 mL Intravenous Q12H   Continuous Infusions:  PRN Meds: acetaminophen **OR** acetaminophen, alum & mag hydroxide-simeth, naphazoline-glycerin   Vital Signs    Vitals:   05/14/20 2153 05/14/20 2200 05/15/20 0500 05/15/20 0640  BP: 137/70   103/61  Pulse: 75   68  Resp:  17  18  Temp:  98.3 F (36.8 C)  98.4 F (36.9 C)  TempSrc:  Oral  Oral  SpO2:  94%  96%  Weight:   82 kg   Height:        Intake/Output Summary (Last 24 hours) at 05/15/2020 1112 Last data filed at 05/15/2020 0700 Gross per 24 hour  Intake 720 ml  Output 1000 ml  Net -280 ml   Filed Weights   05/13/20 0557 05/14/20 0437 05/15/20 0500  Weight: 82.9 kg 82.9 kg 82 kg    Physical Exam   GEN: Well nourished, well developed, in no acute distress. obese HEENT: Grossly normal.  Neck: Supple, no JVD, carotid bruits, or masses. Cardiac: RRR, 3/6 mid peaking harsh  systolic murmur at the right upper sternal border. No rubs, or gallops. No clubbing, cyanosis. 1 + bilateral edema Respiratory:  Respirations regular and unlabored, clear to auscultation bilaterally. GI: Soft, nontender, nondistended, BS + x 4. MS: no deformity or atrophy. Skin: warm and dry, no rash. Neuro:  Strength and sensation are intact. Psych: AAOx3.  Normal affect.  Labs    CBC Recent Labs    05/13/20 0708 05/14/20 0445  WBC 9.2 8.1  HGB 14.2 13.2  HCT 44.9 41.9  MCV 86.5 85.9  PLT 265 239   Basic Metabolic Panel Recent Labs    67/67/20 0708 05/14/20 0445  NA 138 139  K 4.3 4.8  CL 98 101  CO2 30 29  GLUCOSE 95 111*  BUN 18 16  CREATININE 0.70 0.73  CALCIUM 9.4 8.9   Liver Function Tests No results for input(s): AST, ALT, ALKPHOS, BILITOT, PROT, ALBUMIN in the last 72 hours. No results for input(s): LIPASE, AMYLASE in the last 72 hours. Cardiac Enzymes No results for input(s): CKTOTAL, CKMB, CKMBINDEX, TROPONINI in the last 72 hours. BNP Invalid input(s): POCBNP D-Dimer No results for input(s): DDIMER in the last 72 hours. Hemoglobin A1C No results for input(s): HGBA1C in the last 72 hours. Fasting Lipid  Panel No results for input(s): CHOL, HDL, LDLCALC, TRIG, CHOLHDL, LDLDIRECT in the last 72 hours. Thyroid Function Tests Recent Labs    05/14/20 0445  TSH 4.000     ECG    Sinus - Personally Reviewed  Radiology    No results found.  Cardiac Studies   Coronary CT 05/15/20 ADDENDUM REPORT: 05/12/2020 16:38  CLINICAL DATA:  Severe Aortic Stenosis.  EXAM: Cardiac TAVR CT  TECHNIQUE: The patient was scanned on a [employer]. A 120 kV retrospective scan was triggered in the descending thoracic aorta at 111 HU's. Gantry rotation speed was 250 msecs and collimation was .6 mm. 10 mg IV metoprolol was given. No nitroglycerin was given. The 3D data set was reconstructed in 5% intervals of the R-R cycle. Systolic and diastolic  phases were analyzed on a dedicated work station using MPR, MIP and VRT modes. The patient received 80 cc of contrast.  FINDINGS: Image quality: Excellent.  Noise artifact is: Limited.  Valve Morphology: The aortic valve is tricuspid. There leaflets are severely calcified with restricted leaflet motion in systole. The RCC contains bulky calcifications extending from the leaflet tip to the base of the leaflet.  Aortic Valve Calcium score: 1733  Aortic annular dimension:  Phase assessed: 25%  Annular area: 421 mm2  Annular perimeter: 74.2 mm  Max diameter: 26.0 mm  Min diameter: 21.2 mm  Annular and subannular calcification: No significant annular or subannular calcifications.  Optimal coplanar projection: RAO 5, CRA 3  Coronary Artery Height above Annulus:  Left Main: 12.9 mm  Right Coronary: 15.4 mm  Sinus of Valsalva Measurements:  Non-coronary: 27 mm  Right-coronary: 27 mm  Left-coronary: 27 mm  Sinus of Valsalva Height:  Non-coronary: 19.0 mm  Right-coronary: 19.8 mm  Left-coronary: 19.1 mm  Sinotubular Junction: 25 mm.  Mild calcified plaque.  Ascending Thoracic Aorta: 32 mm.  Minimal calcified plaque.  Coronary Arteries: Normal coronary origin. Right dominance. The study was performed without use of NTG and is insufficient for plaque evaluation. Please refer to recent cardiac catheterization for coronary assessment.  Cardiac Morphology:  Right Atrium: Right atrial size is within normal limits.  Right Ventricle: The right ventricular cavity is within normal limits.  Left Atrium: Left atrial size is normal in size with no left atrial appendage filling defect.  Left Ventricle: The ventricular cavity size is within normal limits. There are no stigmata of prior infarction. There is no abnormal filling defect. Hyperdynamic LV function, LVEF=79%. No regional wall motion abnormalities.  Pulmonary arteries:  Normal in size without proximal filling defect.  Pulmonary veins: Normal pulmonary venous drainage.  Pericardium: Normal thickness with no significant effusion or calcium present.  Mitral Valve: The mitral valve is normal structure with mild mitral annular calcification.  Extra-cardiac findings: See attached radiology report for non-cardiac structures.  IMPRESSION: 1. Annular measurements appropriate for 23 mm Sapien 3 TAVR (421 mm2).  2. No significant annular or subannular calcifications.  3. Sufficient coronary to annulus distance.  4. Optimal Fluoroscopic Angle for Delivery: RAO 5 CRA 3  [place] T. Audie Box, MD   Electronically Signed   By: Eleonore Chiquito   On: 05/12/2020 16:38    ___________________   CT angio 05/12/20 CLINICAL DATA:  74 year old female with history of severe aortic stenosis. Preprocedural study prior to potential transcatheter aortic valve replacement (TAVR) procedure.  EXAM: CT ANGIOGRAPHY CHEST, ABDOMEN AND PELVIS  TECHNIQUE: Chest CTA 05/11/2020.  CT the abdomen and pelvis 03/27/2014.  Multidetector CT imaging through the  chest, abdomen and pelvis was performed using the standard protocol during bolus administration of intravenous contrast. Multiplanar reconstructed images and MIPs were obtained and reviewed to evaluate the vascular anatomy.  CONTRAST:  OMNIPAQUE IOHEXOL 350 MG/ML SOLN  COMPARISON:  None.  FINDINGS: CTA CHEST FINDINGS  Cardiovascular: Heart size is normal. There is no significant pericardial fluid, thickening or pericardial calcification. There is aortic atherosclerosis, as well as atherosclerosis of the great vessels of the mediastinum and the coronary arteries, including calcified atherosclerotic plaque in the left main, left anterior descending, left circumflex and right coronary arteries. Thickening calcification of the aortic valve. Calcifications of the  mitral annulus.  Mediastinum/Lymph Nodes: No pathologically enlarged mediastinal or hilar lymph nodes. Esophagus is unremarkable in appearance. No axillary lymphadenopathy.  Lungs/Pleura: No suspicious appearing pulmonary nodules or masses are noted. No acute consolidative airspace disease. No pleural effusions.  Musculoskeletal/Soft Tissues: There are no aggressive appearing lytic or blastic lesions noted in the visualized portions of the skeleton.  CTA ABDOMEN AND PELVIS FINDINGS  Hepatobiliary: No suspicious cystic or solid hepatic lesions. No intra or extrahepatic biliary ductal dilatation. Status post cholecystectomy.  Pancreas: No pancreatic mass. No pancreatic ductal dilatation. No pancreatic or peripancreatic fluid collections or inflammatory changes.  Spleen: Unremarkable.  Adrenals/Urinary Tract: Bilateral kidneys and adrenal glands are normal in appearance. No hydroureteronephrosis. Urinary bladder is normal in appearance.  Stomach/Bowel: Normal appearance of the stomach. No pathologic dilatation of small bowel or colon. Numerous colonic diverticulae are noted, most severe in the descending colon and sigmoid colon, without surrounding inflammatory changes to suggest an acute diverticulitis at this time. Normal appendix.  Vascular/Lymphatic: Aortic atherosclerosis, without evidence of aneurysm or dissection in the abdominal or pelvic vasculature. Vascular findings and measurements pertinent to potential TAVR procedure, as detailed below. No lymphadenopathy noted in the abdomen or pelvis.  Reproductive: Status post hysterectomy. Ovaries are not confidently identified and may be surgically absent or atrophic.  Other: Large umbilical hernia containing only omental fat. No significant volume of ascites. No pneumoperitoneum.  Musculoskeletal: Chronic appearing compression fracture at L1 with 25% loss of anterior vertebral body height. There are no  aggressive appearing lytic or blastic lesions noted in the visualized portions of the skeleton.  VASCULAR MEASUREMENTS PERTINENT TO TAVR:  AORTA:  Minimal Aortic Diameter-12 x 12 mm  Severity of Aortic Calcification-severe  RIGHT PELVIS:  Right Common Iliac Artery -  Minimal Diameter-10.3 x 9.1 mm  Tortuosity - mild  Calcification-mild  Right External Iliac Artery -  Minimal Diameter-6.5 x 6.7 mm  Tortuosity - mild  Calcification-none  Right Common Femoral Artery -  Minimal Diameter-7.6 x 7.1 mm  Tortuosity - mild  Calcification-none  LEFT PELVIS:  Left Common Iliac Artery -  Minimal Diameter-10.1 x 9.9 mm  Tortuosity - mild  Calcification-mild  Left External Iliac Artery -  Minimal Diameter-7.2 x 6.6 mm  Tortuosity - mild  Calcification-none  Left Common Femoral Artery -  Minimal Diameter-7.3 x 6.9 mm  Tortuosity-mild  Calcification-mild  Review of the MIP images confirms the above findings.  IMPRESSION: 1. Vascular findings and measurements pertinent to potential TAVR procedure, as detailed above. 2. Severe thickening calcification of the aortic valve, compatible with reported clinical history of severe aortic stenosis. 3. Aortic atherosclerosis, in addition to left main and 3 vessel coronary artery disease. 4. Severe colonic diverticulosis without evidence of acute diverticulitis at this time. 5. Additional incidental findings, as above.   Patient Profile     [name] is  a 74 y.o. female with a history of HTN, HLD, uncontrolled DMT2, chronic LE edema, severe AS (undergoing TAVR work up) with recent admission for chest pain with NSTEMI (cath showed non obst CAD) who presented [facility] with syncope felt to be related to orthostasis and dehydration.   Assessment & Plan    Severe AS: patient completed TAVR work up while admitted. Plan was for TAVR 6/15, but pt has to be discharged  home to a SNF. For patient convenience, we are working on a plan for inpatient TAVR this week pending OR/surgeon availability.  Syncope: felt to be related to orthostatic hypotension. Orthostatic vital signs positive and lab work showed AKI. She has improved with holding antihypertensive and gentle hydration.  AKI: creat back to baseline. 1.14--> 0.73.  HTN: BP well controlled off antihypertensives.   T2DM: continue SSI per primary team.    Signed, Cline Crock, PA-C  05/15/2020, 11:12 AM  Pager [phone]  Patient seen, examined. Available data reviewed. Agree with findings, assessment, and plan as outlined by Carlean Jews, PA-C.  The patient is independently interviewed and examined.  She is sitting up at bedside eating lunch.  She is alert, oriented, in no distress.  Orientation noted.  JVP is normal, carotid upstrokes are normal bilaterally, 3 clear, heart is regular 2/6 harsh mid peaking systolic murmur abdomen soft and nontender, bilateral 1+ lower extremity edema. I have personally reviewed the patient's CTA studies and has appears to have suitable cardiac and vascular anatomy for a 23 mm Sapien 3 Ultra transcatheter heart valve via right transfemoral access. The patient's dizziness persists but is greatly improved from presentation here. She has limited social support and poor functional capacity. It is probably best for her to proceed with TAVR while she is here in the hospital with plans for ST-SNF for rehab once she recovers from surgery. Will try to arrange formal surgical consultation for TAVR evaluation for a multidisciplinary approach to her care. All of her questions are answered and she is agreeable to this plan as outlined.  Tonny Bollman, M.D. 05/15/2020 12:01 PM

## 2020-05-15 NOTE — Progress Notes (Signed)
Progress Note    [name]  ZOX:[id number] DOB: 01-26-1946  DOA: 05/10/2020 PCP: Myrlene Broker, MD    Brief Narrative:     Medical records reviewed and are as summarized below:  [name] is an 74 y.o. female with medical history significant of hypertension, hyperlipidemia, CAD, severe aortic stenosis, diabetes mellitus type 2, and chronic back pain who presents after having a fall with loss of consciousness.  She had just been recently hospitalized from 5/28-6/2 with complaints of chest pain with NSTEMI.  She underwent cardiac cath which showed nonobstructive disease.  Since leaving the hospital patient reports that she has been constantly dizzy.  She is woken up on the floor at least 3-4 times.  Denies having any chest pain or shortness of breath.  She had been nauseated 3 to 4 days prior to coming into the hospital initially on 5/28, but reports that those symptoms have now resolved.   Assessment/Plan:   Principal Problem:   Syncope Active Problems:   Type II diabetes mellitus with neurological manifestations, uncontrolled (HCC)   Hyperlipidemia associated with type 2 diabetes mellitus (HCC)   Essential hypertension   AKI (acute kidney injury) (HCC)   Aortic stenosis   Pressure injury of skin   Orthostatic hypotension   Dental caries   Syncope Orthostatic hypotension: Acute.  Patient presents after having multiple syncopal episodes at home.  CTA of the chest did not note any signs of a pulmonary embolus.  Patient denies any complaints of chest pain.  Orthostatic vital signs noted to be positive.  Suspect patient has likely been over diuresed during last hospitalization. -per cards: TAVR will hopefully be done this admission  Urinary tract infection: Acute.  She reports having dysuria.  UA positive for signs of infection.  -Urine culture: multiple species -Rocephin IV x3 days completed  Acute kidney injury:  -resolved  Aortic stenosis:  Cardiology would like to have CT angiogram studies of the heart for better characterization of the patient's aortic stenosis.  As well as of the chest abdomen and pelvis.   -per cards  Essential hypertension:  -per cards  Dental caries: Patient noted to have multiple missing teeth with dental caries involving 3 of the residual segment mandibular teeth. -Patient need to follow up with dentist at discharge  Diabetes mellitus type 2: On admission blood sugar elevated to 261.  Home medications include Metformin 500 mg twice daily and Humalog75/25 mix 45 units twice daily -adjust insulin  Dyslipidemia: Home medications include Zetia 10 mg daily and atorvastatin 40 mg daily. -Continue current home regimen  GERD: Home medications include Nexium 40 mg daily. -Continue pharmacy substitution of Protonix  obesity Body mass index is 31.03 kg/m.   Family Communication/Anticipated D/C date and plan/Code Status   DVT prophylaxis: Lovenox ordered. Code Status: Full Code.  Disposition Plan: Status is: Inpatient  Remains inpatient appropriate because:Unsafe d/c plan   Dispo: The patient is from: Home              Anticipated d/c is to: SNF              Anticipated d/c date is: 1 day              Patient currently is medically stable to d/c.     Medical Consultants:    cards  Subjective:   No chest pain, no SOB  Objective:    Vitals:   05/14/20 2153 05/14/20 2200 05/15/20 0500 05/15/20 8119  BP: 137/70   103/61  Pulse: 75   68  Resp:  17  18  Temp:  98.3 F (36.8 C)  98.4 F (36.9 C)  TempSrc:  Oral  Oral  SpO2:  94%  96%  Weight:   82 kg   Height:        Intake/Output Summary (Last 24 hours) at 05/15/2020 1219 Last data filed at 05/15/2020 0700 Gross per 24 hour  Intake 360 ml  Output 1000 ml  Net -640 ml   Filed Weights   05/13/20 0557 05/14/20 0437 05/15/20 0500  Weight: 82.9 kg 82.9 kg 82 kg    Exam:   General: Appearance:    Obese female in no  acute distress     Lungs:     Clear to auscultation bilaterally, respirations unlabored  Heart:    Normal heart rate. Normal rhythm.  murmur  MS:   All extremities are intact.   Neurologic:   Awake, alert, oriented x 3. No apparent focal neurological           defect.     Data Reviewed:   I have personally reviewed following labs and imaging studies:  Labs: Labs show the following:   Basic Metabolic Panel: Recent Labs  Lab 05/09/20 0638 05/09/20 0735 05/09/20 0907 05/09/20 0925 05/09/20 0925 05/10/20 2359 05/10/20 2359 05/12/20 0752 05/12/20 0752 05/13/20 0708 05/14/20 0445  NA 138  --    < > 138  --  134*  --  136  --  138 139  K 3.1*  --    < > 3.4*   < > 4.5   < > 4.2   < > 4.3 4.8  CL 98  --   --   --   --  95*  --  99  --  98 101  CO2 30  --   --   --   --  29  --  27  --  30 29  GLUCOSE 135*  --   --   --   --  261*  --  142*  --  95 111*  BUN 17  --   --   --   --  33*  --  17  --  18 16  CREATININE 0.75  --   --   --   --  1.14*  --  0.70  --  0.70 0.73  CALCIUM 8.5*  --   --   --   --  8.9  --  9.1  --  9.4 8.9  MG  --  1.7  --   --   --   --   --   --   --   --   --    < > = values in this interval not displayed.   GFR Estimated Creatinine Clearance: 64.9 mL/min (by C-G formula based on SCr of 0.73 mg/dL). Liver Function Tests: Recent Labs  Lab 05/10/20 2359  AST 44*  ALT 46*  ALKPHOS 179*  BILITOT 0.5  PROT 7.4  ALBUMIN 3.2*   No results for input(s): LIPASE, AMYLASE in the last 168 hours. No results for input(s): AMMONIA in the last 168 hours. Coagulation profile No results for input(s): INR, PROTIME in the last 168 hours.  CBC: Recent Labs  Lab 05/10/20 0154 05/10/20 2359 05/12/20 0752 05/13/20 0708 05/14/20 0445  WBC 9.2 15.0* 9.6 9.2 8.1  NEUTROABS  --  11.1*  --   --   --  HGB 14.7 14.8 13.5 14.2 13.2  HCT 45.5 46.3* 42.4 44.9 41.9  MCV 83.8 84.8 85.0 86.5 85.9  PLT 218 219 216 265 239   Cardiac Enzymes: No results for  input(s): CKTOTAL, CKMB, CKMBINDEX, TROPONINI in the last 168 hours. BNP (last 3 results) No results for input(s): PROBNP in the last 8760 hours. CBG: Recent Labs  Lab 05/14/20 1148 05/14/20 1534 05/14/20 2143 05/15/20 0630 05/15/20 1105  GLUCAP 176* 202* 268* 165* 233*   D-Dimer: No results for input(s): DDIMER in the last 72 hours. Hgb A1c: No results for input(s): HGBA1C in the last 72 hours. Lipid Profile: No results for input(s): CHOL, HDL, LDLCALC, TRIG, CHOLHDL, LDLDIRECT in the last 72 hours. Thyroid function studies: Recent Labs    05/14/20 0445  TSH 4.000   Anemia work up: No results for input(s): VITAMINB12, FOLATE, FERRITIN, TIBC, IRON, RETICCTPCT in the last 72 hours. Sepsis Labs: Recent Labs  Lab 05/10/20 2359 05/12/20 0752 05/13/20 0708 05/14/20 0445  WBC 15.0* 9.6 9.2 8.1    Microbiology Recent Results (from the past 240 hour(s))  SARS Coronavirus 2 by RT PCR (hospital order, performed in [facility] hospital lab) Nasopharyngeal Nasopharyngeal Swab     Status: None   Collection Time: 05/05/20  5:35 PM   Specimen: Nasopharyngeal Swab  Result Value Ref Range Status   SARS Coronavirus 2 NEGATIVE NEGATIVE Final    Comment: (NOTE) SARS-CoV-2 target nucleic acids are NOT DETECTED. The SARS-CoV-2 RNA is generally detectable in upper and lower respiratory specimens during the acute phase of infection. The lowest concentration of SARS-CoV-2 viral copies this assay can detect is 250 copies / mL. A negative result does not preclude SARS-CoV-2 infection and should not be used as the sole basis for treatment or other patient management decisions.  A negative result may occur with improper specimen collection / handling, submission of specimen other than nasopharyngeal swab, presence of viral mutation(s) within the areas targeted by this assay, and inadequate number of viral copies (<250 copies / mL). A negative result must be combined with  clinical observations, patient history, and epidemiological information. Fact Sheet for Patients:   BoilerBrush.com.cy Fact Sheet for Healthcare Providers: https://pope.com/ This test is not yet approved or cleared  by the [place] FDA and has been authorized for detection and/or diagnosis of SARS-CoV-2 by FDA under an Emergency Use Authorization (EUA).  This EUA will remain in effect (meaning this test can be used) for the duration of the COVID-19 declaration under Section 564(b)(1) of the Act, 21 U.S.C. section 360bbb-3(b)(1), unless the authorization is terminated or revoked sooner. Performed at [facility], [address], [place], [place] [address]   SARS Coronavirus 2 by RT PCR (hospital order, performed in [facility] hospital lab) Nasopharyngeal Nasopharyngeal Swab     Status: None   Collection Time: 05/11/20 12:49 AM   Specimen: Nasopharyngeal Swab  Result Value Ref Range Status   SARS Coronavirus 2 NEGATIVE NEGATIVE Final    Comment: (NOTE) SARS-CoV-2 target nucleic acids are NOT DETECTED. The SARS-CoV-2 RNA is generally detectable in upper and lower respiratory specimens during the acute phase of infection. The lowest concentration of SARS-CoV-2 viral copies this assay can detect is 250 copies / mL. A negative result does not preclude SARS-CoV-2 infection and should not be used as the sole basis for treatment or other patient management decisions.  A negative result may occur with improper specimen collection / handling, submission of specimen other than nasopharyngeal swab, presence  of viral mutation(s) within the areas targeted by this assay, and inadequate number of viral copies (<250 copies / mL). A negative result must be combined with clinical observations, patient history, and epidemiological information. Fact Sheet for Patients:   BoilerBrush.com.cy Fact Sheet for Healthcare  Providers: https://pope.com/ This test is not yet approved or cleared  by the [place] FDA and has been authorized for detection and/or diagnosis of SARS-CoV-2 by FDA under an Emergency Use Authorization (EUA).  This EUA will remain in effect (meaning this test can be used) for the duration of the COVID-19 declaration under Section 564(b)(1) of the Act, 21 U.S.C. section 360bbb-3(b)(1), unless the authorization is terminated or revoked sooner. Performed at [facility], [address], [place], [place] [address]   Urine culture     Status: Abnormal   Collection Time: 05/11/20  2:20 AM   Specimen: Urine, Random  Result Value Ref Range Status   Specimen Description URINE, RANDOM  Final   Special Requests   Final    NONE Performed at [facility], [address] [place], [place] [address]    Culture MULTIPLE SPECIES PRESENT, SUGGEST RECOLLECTION (A)  Final   Report Status 05/11/2020 FINAL  Final    Procedures and diagnostic studies:  No results found.  Medications:   . aspirin EC  81 mg Oral Daily  . atorvastatin  40 mg Oral Daily  . citalopram  20 mg Oral Daily  . enoxaparin (LOVENOX) injection  40 mg Subcutaneous Q24H  . ezetimibe  10 mg Oral Daily  . gabapentin  300 mg Oral TID  . insulin aspart  0-15 Units Subcutaneous TID WC  . insulin aspart  0-5 Units Subcutaneous QHS  . metoprolol tartrate  12.5 mg Oral BID  . pantoprazole  40 mg Oral Daily  . sodium chloride flush  3 mL Intravenous Q12H   Continuous Infusions:    LOS: 4 days   Joseph Art  [facility]   How to contact the [facility] Attending or Consulting provider 7A - 7P or covering provider during after hours 7P -7A, for this patient?  1. Check the care team in [facility] and look for a) attending/consulting TRH provider listed and b) the [facility] team listed 2. Log into [email or web address] and use [facility] universal password to access. If you do not have the password,  please contact the hospital operator. 3. Locate the [facility] provider you are looking for under [facility] and page to a number that you can be directly reached. 4. If you still have difficulty reaching the provider, please page the [facility] (Director on Call) for the Hospitalists listed on amion for assistance.  05/15/2020, 12:19 PM

## 2020-05-15 NOTE — Progress Notes (Signed)
Physical Therapy Treatment Patient Details Name: [name] MRN: [id number] DOB: 12/21/1945 Today's Date: 05/15/2020    History of Present Illness Pt is a 74 y/o female admitted secondary to syncope and positive orthostatics. Pt with recent admission secondary to severe aortic stenosis and underwent cardica cath. PMH includes DM, HTN, and gerd.     PT Comments    Continuing work on functional mobility and activity tolerance;  Session focused on amb and activity tolerance, with close watch of BPs; notable fatigue with walking, and standing BP mid-walk was 98/63, so we proceeded back to the room; BP sitting was 123/62; We also discussed dc plans, and at this point, I agree with SNF for short term rehab -- appreciate Cards looking into moving her upcoming TAVR to sooner   Follow Up Recommendations  SNF;Other (comment)(inconsistent help at home)     Equipment Recommendations  Other (comment)(rollator RW)    Recommendations for Other Services       Precautions / Restrictions Precautions Precautions: Fall;Other (comment) Precaution Comments: watch BP  Restrictions Weight Bearing Restrictions: No    Mobility  Bed Mobility Overal bed mobility: Needs Assistance Bed Mobility: Supine to Sit     Supine to sit: Min guard     General bed mobility comments: no assist needed.  Transfers Overall transfer level: Needs assistance Equipment used: None Transfers: Sit to/from Stand Sit to Stand: Min guard         General transfer comment: Minguard for safety; notable dependence on UEs for support/steadiness  Ambulation/Gait Ambulation/Gait assistance: Min guard Gait Distance (Feet): 150 Feet Assistive device: None;Rolling walker (2 wheeled) Gait Pattern/deviations: Step-through pattern;Decreased stride length;Decreased stance time - left;Antalgic Gait velocity: decreased   General Gait Details: Initiated gait with walk to bathroom without assistive device, and notable  dependence on reaching out for UE support for steadiness; Fatigued during walk, and obtained a stnading BP, which was low, so walked back to her room; Slow, mostly steady gait with use of RW for support; fatigues with weakness in left knee.   Stairs             Wheelchair Mobility    Modified Rankin (Stroke Patients Only)       Balance Overall balance assessment: Needs assistance Sitting-balance support: Feet supported;No upper extremity supported Sitting balance-Leahy Scale: Good     Standing balance support: During functional activity Standing balance-Leahy Scale: Poor Standing balance comment: Requires UE support; able to wash hands at sink without support, left knee instability noted.                            Cognition Arousal/Alertness: Awake/alert Behavior During Therapy: WFL for tasks assessed/performed Overall Cognitive Status: Within Functional Limits for tasks assessed                                        Exercises      General Comments General comments (skin integrity, edema, etc.): Today, symptomatic for fatigue, and we took a standing BP which was 98/64, compared to 123/62 in sitting post amb      Pertinent Vitals/Pain Pain Assessment: Faces Faces Pain Scale: Hurts little more Pain Location: left knee post amb Pain Descriptors / Indicators: Aching;Sore Pain Intervention(s): Monitored during session    Home Living  Prior Function            PT Goals (current goals can now be found in the care plan section) Acute Rehab PT Goals Patient Stated Goal: To go home to own house PT Goal Formulation: With patient Time For Goal Achievement: 05/25/20 Potential to Achieve Goals: Good Progress towards PT goals: Progressing toward goals    Frequency    Min 3X/week      PT Plan Discharge plan needs to be updated    Co-evaluation              AM-PAC PT "6 Clicks" Mobility    Outcome Measure  Help needed turning from your back to your side while in a flat bed without using bedrails?: None Help needed moving from lying on your back to sitting on the side of a flat bed without using bedrails?: None Help needed moving to and from a bed to a chair (including a wheelchair)?: A Little Help needed standing up from a chair using your arms (e.g., wheelchair or bedside chair)?: A Little Help needed to walk in hospital room?: A Little Help needed climbing 3-5 steps with a railing? : A Lot 6 Click Score: 19    End of Session Equipment Utilized During Treatment: Gait belt Activity Tolerance: Patient tolerated treatment well Patient left: in chair;with call bell/phone within reach;with chair alarm set Nurse Communication: Mobility status PT Visit Diagnosis: Unsteadiness on feet (R26.81);Muscle weakness (generalized) (M62.81);History of falling (Z91.81)     Time: 1660-6301 PT Time Calculation (min) (ACUTE ONLY): 29 min  Charges:  $Gait Training: 8-22 mins $Therapeutic Activity: 8-22 mins                     Van Clines, PT  Acute Rehabilitation Services Pager [phone] Office [phone]    [name] 05/15/2020, 1:17 PM

## 2020-05-15 NOTE — TOC Progression Note (Signed)
Transition of Care [facility]) - Progression Note    Patient Details  Name: [name] MRN: [id number] Date of Birth: 07-20-1946  [facility]) CM/SW Contact  Doy Hutching, [place] Phone Number: 05/15/2020, 3:46 PM  Clinical Narrative:    CSW withdrew authorization as pt planned for additional medical work up. PASRR obtained- [id number].   Expected Discharge Plan: Skilled Nursing Facility Barriers to Discharge: English as a second language teacher, Continued Medical Work up  Expected Discharge Plan and Services Expected Discharge Plan: Skilled Nursing Facility Living arrangements for the past 2 months: Single Family Home  Readmission Risk Interventions No flowsheet data found.

## 2020-05-15 NOTE — Progress Notes (Signed)
Pt HR sustaining between 132-144, pt Is not symptomatic or SOB. Notified Dr. Benjamine Mola and paged cardiology at [phone] awaiting page back. Got EKG on pt per Dr. Benjamine Mola orders and will administer pm Lopressor early.

## 2020-05-15 NOTE — TOC Progression Note (Signed)
Transition of Care [facility]) - Progression Note    Patient Details  Name: [name] MRN: [id number] Date of Birth: Feb 05, 1946  Transition of Care [facility]) CM/SW Contact  Doy Hutching, [place] Phone Number: 05/15/2020, 8:44 AM  Clinical Narrative:    [facility] pending, PASRR pending. New COVID swab requested from attending MD.    Expected Discharge Plan: Skilled Nursing Facility Barriers to Discharge: Insurance Authorization, Continued Medical Work up  Expected Discharge Plan and Services Expected Discharge Plan: Skilled Nursing Facility       Living arrangements for the past 2 months: Single Family Home     Readmission Risk Interventions No flowsheet data found.

## 2020-05-16 ENCOUNTER — Inpatient Hospital Stay (HOSPITAL_COMMUNITY): Payer: Medicare Other

## 2020-05-16 DIAGNOSIS — I350 Nonrheumatic aortic (valve) stenosis: Secondary | ICD-10-CM

## 2020-05-16 DIAGNOSIS — R5382 Chronic fatigue, unspecified: Secondary | ICD-10-CM

## 2020-05-16 DIAGNOSIS — R601 Generalized edema: Secondary | ICD-10-CM

## 2020-05-16 LAB — TYPE AND SCREEN
ABO/RH(D): A POS
Antibody Screen: NEGATIVE

## 2020-05-16 LAB — GLUCOSE, CAPILLARY
Glucose-Capillary: 269 mg/dL — ABNORMAL HIGH (ref 70–99)
Glucose-Capillary: 253 mg/dL — ABNORMAL HIGH (ref 70–99)
Glucose-Capillary: 191 mg/dL — ABNORMAL HIGH (ref 70–99)
Glucose-Capillary: 306 mg/dL — ABNORMAL HIGH (ref 70–99)

## 2020-05-16 LAB — PROTIME-INR
INR: 1 (ref 0.8–1.2)
Prothrombin Time: 13.2 seconds (ref 11.4–15.2)

## 2020-05-16 LAB — BLOOD GAS, ARTERIAL
Acid-Base Excess: 1.4 mmol/L (ref 0.0–2.0)
Bicarbonate: 25.8 mmol/L (ref 20.0–28.0)
Drawn by: 441371
FIO2: 21
O2 Saturation: 92 %
Patient temperature: 37
pCO2 arterial: 42.9 mmHg (ref 32.0–48.0)
pH, Arterial: 7.397 (ref 7.350–7.450)
pO2, Arterial: 64.9 mmHg — ABNORMAL LOW (ref 83.0–108.0)

## 2020-05-16 LAB — APTT: aPTT: 32 s (ref 24–36)

## 2020-05-16 MED ORDER — TEMAZEPAM 15 MG PO CAPS: 15.0000 mg | ORAL_CAPSULE | Freq: Once | ORAL | Status: DC | PRN

## 2020-05-16 MED ORDER — DEXMEDETOMIDINE HCL IN NACL 400 MCG/100ML IV SOLN
0.1000 ug/kg/h | INTRAVENOUS | Status: AC
  Administered 2020-05-17: 1 ug/kg/h via INTRAVENOUS
  Filled 2020-05-16: qty 100

## 2020-05-16 MED ORDER — POTASSIUM CHLORIDE 2 MEQ/ML IV SOLN
80.0000 meq | INTRAVENOUS | Status: DC
  Filled 2020-05-16: qty 40

## 2020-05-16 MED ORDER — BISACODYL 5 MG PO TBEC
5.0000 mg | DELAYED_RELEASE_TABLET | Freq: Once | ORAL | Status: AC
  Administered 2020-05-16: 5 mg via ORAL
  Filled 2020-05-16: qty 1

## 2020-05-16 MED ORDER — CHLORHEXIDINE GLUCONATE 4 % EX LIQD
1.0000 "application " | Freq: Once | CUTANEOUS | Status: DC
  Filled 2020-05-16: qty 15

## 2020-05-16 MED ORDER — CHLORHEXIDINE GLUCONATE 0.12 % MT SOLN
15.0000 mL | Freq: Once | OROMUCOSAL | Status: AC
  Administered 2020-05-17: 15 mL via OROMUCOSAL
  Filled 2020-05-16 (×2): qty 15

## 2020-05-16 MED ORDER — NOREPINEPHRINE 4 MG/250ML-% IV SOLN
0.0000 ug/min | INTRAVENOUS | Status: DC
  Filled 2020-05-16: qty 250

## 2020-05-16 MED ORDER — SODIUM CHLORIDE 0.9 % IV SOLN
INTRAVENOUS | Status: DC
  Filled 2020-05-16: qty 30

## 2020-05-16 MED ORDER — SODIUM CHLORIDE 0.9 % IV SOLN
1.5000 g | INTRAVENOUS | Status: AC
  Administered 2020-05-17: 1.5 g via INTRAVENOUS
  Filled 2020-05-16: qty 1.5

## 2020-05-16 MED ORDER — MAGNESIUM SULFATE 50 % IJ SOLN
40.0000 meq | INTRAMUSCULAR | Status: DC
  Filled 2020-05-16: qty 9.85

## 2020-05-16 MED ORDER — VANCOMYCIN HCL 1250 MG/250ML IV SOLN
1250.0000 mg | INTRAVENOUS | Status: AC
  Administered 2020-05-17: 1250 mg via INTRAVENOUS
  Filled 2020-05-16: qty 250

## 2020-05-16 NOTE — Consult Note (Signed)
[facility]  MULTIDISCIPLINARY HEART VALVE CLINIC  CARDIOTHORACIC SURGERY CONSULTATION REPORT  Referring Provider is No ref. provider found Primary Cardiologist is Chilton Si, MD PCP is Myrlene Broker, MD  Chief Complaint  Patient presents with  . Fall  . Loss of Consciousness  Severe aortic stenosis    HPI:  The patient is a 74 year old woman with a history of diabetes, hypertension, and hyperlipidemia who was admitted on 05/05/2020 with a history of lower extremity edema and chest discomfort.  She had mildly elevated troponin level felt to be consistent with non-ST segment elevation MI.  A 2D echocardiogram showed severe aortic stenosis with a mean gradient of 32 mmHg and a peak gradient of 48 mmHg.  Dimensionless index of 0.22 and aortic valve area was 0.62 cm.  [facility]leman cardiac catheterization showing patent coronary arteries without obstructive disease.  The mean gradient across aortic valve was measured at 33 mmHg with a peak gradient of 42 mmHg.  Aortic valve area was calculated at 0.76 cm.  It was felt that she required aortic valve replacement but the plan was for outpatient work-up.  She was discharged home on 05/10/2020 but returned with lightheadedness and syncope.  She was noted to have orthostatic vital signs.  She reported that she had dizziness when changing from sitting to standing and just walk to her mailbox when she lost consciousness in the kitchen.  In retrospect she said that she has had recurrent episodes of dizziness and near syncope for a few months.  She is not very active and reports no significant shortness of breath although she has had exertional fatigue and has had to hire someone to take care of her housework since she does not have the stamina to do that.  She has had lower extremity edema which she says has been present for many years.  She is single and lives in [place].  She does not drive.  Her children live out of state.  She  has a sister-in-law locally who provides most of her support and drives her to the store.  She does not see a dentist regularly and was seen by Dr. Kristin Bruins recently.  He did not feel that she had a acute dental problems that needed to be addressed before aortic valve placement. Past Medical History:  Diagnosis Date  . Achilles tendinitis   . Acute renal failure (ARF) (HCC) 12/19/2014  . Calcaneal spur    right  . Cataract   . Depression   . Diabetes mellitus    type II  . Diverticulosis of colon (without mention of hemorrhage) 2013  . GERD (gastroesophageal reflux disease)   . GI bleed 03/26/14  . Hyperlipidemia   . Hypertension   . Internal hemorrhoids 2013  . Peripheral neuropathy   . Right shoulder pain    Subacromial tendinitis  . Venous insufficiency   . Ventral hernia     Past Surgical History:  Procedure Laterality Date  . ABDOMINAL HYSTERECTOMY  2001   TAH-BSO  . CHOLECYSTECTOMY  2001  . COLONOSCOPY  2013   diverticulosis   . ESOPHAGOGASTRODUODENOSCOPY  2013   normal   . INCISIONAL HERNIA REPAIR    . RIGHT/LEFT HEART CATH AND CORONARY ANGIOGRAPHY N/A 05/09/2020   Procedure: RIGHT/LEFT HEART CATH AND CORONARY ANGIOGRAPHY;  Surgeon: Corky Crafts, MD;  Location: [facility] INVASIVE CV LAB;  Service: Cardiovascular;  Laterality: N/A;  . TONSILLECTOMY      Family History  Problem Relation Age of Onset  .  Heart failure Father        Died in 65s  . CVA Father   . Ovarian cancer Sister   . Diabetes Sister   . Other Sister        died at birth  . Heart failure Mother        Died in 45s  . Other Brother        drowned  . Diabetes Paternal Grandmother   . Breast cancer Paternal Aunt   . Colon cancer Neg Hx     Social History   Socioeconomic History  . Marital status: Divorced    Spouse name: Not on file  . Number of children: 2  . Years of education: Not on file  . Highest education level: Not on file  Occupational History  . 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 Smoking status: Never Smoker  . Smokeless tobacco: Never Used  Substance and Sexual Activity  . Alcohol use: No  . Drug use: No  . Sexual activity: Not on file  Other Topics Concern  . Not on file  Social History Narrative   Divorced   Never Smoked   Alcohol use-no   Drug use-no      Recently had to retire from job 2/2 limitaitons of walking      Quilting is a good Public librarian initiated. Patient needs to submit further paperwork to complete   Rudell Cobb  March 06, 2010 2:35 PM   Financial assistance approved for 100% discount at [facility] and has [facility] card   Rudell Cobb  March 12, 2010 3:55 PM   Social Determinants of Health   Financial Resource Strain:   . Difficulty of Paying Living Expenses:   Food Insecurity:   . Worried About Programme researcher, broadcasting/film/video in the Last Year:   . Barista in the Last Year:   Transportation Needs:   . Freight forwarder (Medical):   Marland Kitchen Lack of Transportation (Non-Medical):   Physical Activity:   . Days of Exercise per Week:   . Minutes of Exercise per Session:   Stress:   . Feeling of Stress :   Social Connections:   . Frequency of Communication with Friends and Family:   . Frequency of Social Gatherings with Friends and Family:   . Attends Religious Services:   . Active Member of Clubs or Organizations:   . Attends Banker Meetings:   Marland Kitchen Marital Status:   Intimate Partner Violence:   . Fear of Current or Ex-Partner:   . Emotionally Abused:   Marland Kitchen Physically Abused:   . Sexually Abused:     Current Facility-Administered Medications  Medication Dose Route Frequency Provider Last Rate Last Admin  . acetaminophen (TYLENOL) tablet 650 mg  650 mg Oral Q6H PRN Madelyn Flavors A, MD   650 mg at 05/13/20 1209   Or  . acetaminophen (TYLENOL) suppository 650 mg  650 mg Rectal Q6H PRN Madelyn Flavors A, MD      . alum & mag  hydroxide-simeth (MAALOX/MYLANTA) 200-200-20 MG/5ML suspension 15 mL  15 mL Oral Q6H PRN Vann, Jessica U, DO      . aspirin EC tablet 81 mg  81 mg Oral Daily Katrinka Blazing, Rondell A, MD   81 mg at 05/16/20 0802  . atorvastatin (LIPITOR) tablet 40 mg  40 mg Oral Daily  Clydie Braun, MD   40 mg at 05/16/20 0801  . [START ON 05/17/2020] cefUROXime (ZINACEF) 1.5 g in sodium chloride 0.9 % 100 mL IVPB  1.5 g Intravenous To OR Tonny Bollman, MD      . citalopram (CELEXA) tablet 20 mg  20 mg Oral Daily Katrinka Blazing, Rondell A, MD   20 mg at 05/16/20 0801  . [START ON 05/17/2020] dexmedetomidine (PRECEDEX) 400 MCG/100ML (4 mcg/mL) infusion  0.1-0.7 mcg/kg/hr Intravenous To OR Tonny Bollman, MD      . ezetimibe (ZETIA) tablet 10 mg  10 mg Oral Daily Madelyn Flavors A, MD   10 mg at 05/16/20 0801  . gabapentin (NEURONTIN) capsule 300 mg  300 mg Oral TID Madelyn Flavors A, MD   300 mg at 05/16/20 0801  . [START ON 05/17/2020] heparin 30,000 units/NS 1000 mL solution for CELLSAVER   Other To OR Tonny Bollman, MD      . insulin aspart (novoLOG) injection 0-15 Units  0-15 Units Subcutaneous TID WC Marlin Canary U, DO   8 Units at 05/16/20 [phone]  . insulin aspart (novoLOG) injection 0-5 Units  0-5 Units Subcutaneous QHS Marlin Canary U, DO   2 Units at 05/15/20 2157  . insulin aspart protamine- aspart (NOVOLOG MIX 70/30) injection 15 Units  15 Units Subcutaneous BID WC Marlin Canary U, DO   15 Units at 05/16/20 0802  . [START ON 05/17/2020] magnesium sulfate (IV Push/IM) injection 40 mEq  40 mEq Other To OR Tonny Bollman, MD      . metoprolol tartrate (LOPRESSOR) tablet 12.5 mg  12.5 mg Oral BID Madelyn Flavors A, MD   12.5 mg at 05/16/20 0801  . naphazoline-glycerin (CLEAR EYES REDNESS) ophth solution 1-2 drop  1-2 drop Both Eyes QID PRN Clydie Braun, MD      . Melene Muller ON 05/17/2020] norepinephrine (LEVOPHED) 4mg  in premix infusion  0-10 mcg/min Intravenous To OR Tonny Bollman, MD      . pantoprazole (PROTONIX) EC  tablet 40 mg  40 mg Oral Daily Madelyn Flavors A, MD   40 mg at 05/16/20 0802  . [START ON 05/17/2020] potassium chloride injection 80 mEq  80 mEq Other To OR Tonny Bollman, MD      . sodium chloride flush (NS) 0.9 % injection 3 mL  3 mL Intravenous Q12H Smith, Rondell A, MD   3 mL at 05/16/20 0802  . [START ON 05/17/2020] vancomycin (VANCOREADY) IVPB 1250 mg/250 mL  1,250 mg Intravenous To OR Tonny Bollman, MD        Allergies  Allergen Reactions  . Morphine Other (See Comments)    'took me out of this world' I had to be resuscitated  . Codeine Sulfate Other (See Comments)    GI upset and pain  . Demerol [Meperidine] Nausea And Vomiting  . Meperidine Hcl Nausea And Vomiting and Swelling  . Propoxyphene Hcl Other (See Comments)    Darvocet caused sick headache      Review of Systems:   General:  normal appetite, + decreased energy, no weight gain, no weight loss, no fever  Cardiac:  + chest pain with exertion, + chest pain at rest, no SOB with  exertion, no resting SOB, no PND, no orthopnea, no palpitations, no arrhythmia, no atrial fibrillation, + LE edema, + dizzy spells, + syncope  Respiratory:  no shortness of breath, no home oxygen, no productive cough, no dry cough, no bronchitis, no wheezing, no hemoptysis, no asthma, no pain with inspiration  or cough, no sleep apnea, no CPAP at night  GI:   no difficulty swallowing, no reflux, no frequent heartburn, no hiatal hernia, no abdominal pain, no constipation, no diarrhea, no hematochezia, no hematemesis, no melena  GU:   no dysuria,  no frequency, no urinary tract infection, no hematuria, no kidney stones, no kidney disease  Vascular:  no pain suggestive of claudication, no pain in feet, no leg cramps, no varicose veins, no DVT, no non-healing foot ulcer  Neuro:   no stroke, no TIA's, no seizures, no headaches, no temporary blindness one eye,  no slurred speech, no peripheral neuropathy, no chronic pain, no instability of gait, no  memory/cognitive dysfunction  Musculoskeletal: no arthritis, no joint swelling, no myalgias, + difficulty walking, + reduced mobility   Skin:   no rash, no itching, no skin infections, no pressure sores or ulcerations  Psych:   no anxiety, no depression, no nervousness, no unusual recent stress  Eyes:   no blurry vision, no floaters, no recent vision changes, does not wear glasses or contacts  ENT:   no hearing loss, no loose or painful teeth, no dentures, last saw dentist last month.  Hematologic:  no easy bruising, no abnormal bleeding, no clotting disorder, no frequent epistaxis  Endocrine:  + diabetes, does check CBG's at home           Physical Exam:   BP (!) 114/58 (BP Location: Right Arm)   Pulse 66   Temp 97.9 F (36.6 C) (Oral)   Resp 17   Ht 5\' 4"  (1.626 m)   Wt 81 kg   SpO2 94%   BMI 30.65 kg/m   General:  Elderly,  well-appearing  HEENT:  Unremarkable, NCAT, PERLA, EOMI  Neck:   no JVD, no bruits, no adenopathy   Chest:   clear to auscultation, symmetrical breath sounds, no wheezes, no rhonchi   CV:   RRR, grade lll/VI crescendo/decrescendo murmur heard best at RSB,  no diastolic murmur  Abdomen:  soft, non-tender, no masses   Extremities:  warm, well-perfused, pulses palpable at ankle, mild LE edema  Rectal/GU  Deferred  Neuro:   Grossly non-focal and symmetrical throughout  Skin:   Clean and dry, no rashes, no breakdown, thickened skin of lower legs and feet.   Diagnostic Tests:  ECHOCARDIOGRAM REPORT       Patient Name:  [name] Date of Exam: 05/06/2020  Medical Rec #: [id number]       Height:    64.0 in  Accession #:  [id number]      Weight:    188.3 lb  Date of Birth: 1946-07-03       BSA:     1.907 m  Patient Age:  74 years       BP:      97/50 mmHg  Patient Gender: F           HR:      80 bpm.  Exam Location: Inpatient   Procedure: 2D Echo, Color Doppler, Cardiac  Doppler and Intracardiac       Opacification Agent   Indications:  NSTEMI    History:    Patient has prior history of Echocardiogram examinations,  most         recent 10/08/2012. NSTEMI; Risk Factors:Hypertension,  Diabetes         and Dyslipidemia.    Sonographer:  Irving BurtonEmily Senior RDCS  Referring Phys: [id number] Corrin ParkerALLIE E GOODRICH     Sonographer Comments: Technically  difficult due to poor echo windows and  reduced skin integrity.  IMPRESSIONS    1. Left ventricular ejection fraction, by estimation, is 55 to 60%. The  left ventricle has normal function. The left ventricle has no regional  wall motion abnormalities. There is mild concentric left ventricular  hypertrophy. Left ventricular diastolic  function could not be evaluated.  2. Right ventricular systolic function is normal. The right ventricular  size is normal. Tricuspid regurgitation signal is inadequate for assessing  PA pressure.  3. The mitral valve is grossly normal. No evidence of mitral valve  regurgitation. No evidence of mitral stenosis.  4. Unable to determine valve morphology due to image quality. Aortic  valve regurgitation is not visualized. Moderate aortic valve stenosis.  Aortic valve area, by VTI measures 0.74 cm. Aortic valve mean gradient  measures 32.0 mmHg. Aortic valve Vmax  measures 3.47 m/s.  5. The inferior vena cava is normal in size with greater than 50%  respiratory variability, suggesting right atrial pressure of 3 mmHg.   Comparison(s): Changes from prior study are noted. Aortic stenosis is now  moderate. LVEF is normal.   FINDINGS  Left Ventricle: Left ventricular ejection fraction, by estimation, is 55  to 60%. The left ventricle has normal function. The left ventricle has no  regional wall motion abnormalities. Definity contrast agent was given IV  to delineate the left ventricular  endocardial borders. The left ventricular internal cavity size  was normal  in size. There is mild concentric left ventricular hypertrophy. Left  ventricular diastolic function could not be evaluated due to nondiagnostic  images. Left ventricular diastolic  function could not be evaluated.   Right Ventricle: The right ventricular size is normal. No increase in  right ventricular wall thickness. Right ventricular systolic function is  normal. Tricuspid regurgitation signal is inadequate for assessing PA  pressure.   Left Atrium: Left atrial size was normal in size.   Right Atrium: Right atrial size was normal in size.   Pericardium: Trivial pericardial effusion is present. Presence of  pericardial fat pad.   Mitral Valve: The mitral valve is grossly normal. Mild mitral annular  calcification. No evidence of mitral valve regurgitation. No evidence of  mitral valve stenosis.   Tricuspid Valve: The tricuspid valve is grossly normal. Tricuspid valve  regurgitation is not demonstrated. No evidence of tricuspid stenosis.   Aortic Valve: Unable to determine valve morphology due to image quality..  There is moderate thickening and moderate calcification of the aortic  valve. Aortic valve regurgitation is not visualized. Moderate aortic  stenosis is present. There is moderate  thickening of the aortic valve. There is moderate calcification of the  aortic valve. Aortic valve mean gradient measures 32.0 mmHg. Aortic valve  peak gradient measures 48.2 mmHg. Aortic valve area, by VTI measures 0.74  cm.   Pulmonic Valve: The pulmonic valve was grossly normal. Pulmonic valve  regurgitation is not visualized. No evidence of pulmonic stenosis.   Aorta: The aortic root and ascending aorta are structurally normal, with  no evidence of dilitation.   Venous: The inferior vena cava is normal in size with greater than 50%  respiratory variability, suggesting right atrial pressure of 3 mmHg.   IAS/Shunts: The atrial septum is grossly normal.     LEFT  VENTRICLE  PLAX 2D  LVIDd:     3.80 cm Diastology  LVIDs:     2.30 cm LV e' lateral:  8.38 cm/s  LV PW:     1.00  cm LV E/e' lateral: 10.6  LV IVS:    1.20 cm LV e' medial:  4.79 cm/s  LVOT diam:   1.90 cm LV E/e' medial: 18.6  LV SV:     51  LV SV Index:  27  LVOT Area:   2.84 cm     RIGHT VENTRICLE  RV S prime:   9.57 cm/s  TAPSE (M-mode): 2.0 cm   LEFT ATRIUM       Index    RIGHT ATRIUM      Index  LA diam:    3.60 cm 1.89 cm/m RA Area:   11.00 cm  LA Vol (A2C):  62.9 ml 32.99 ml/m RA Volume:  24.50 ml 12.85 ml/m  LA Vol (A4C):  52.5 ml 27.53 ml/m  LA Biplane Vol: 58.8 ml 30.84 ml/m  AORTIC VALVE  AV Area (Vmax):  0.62 cm  AV Area (Vmean):  0.59 cm  AV Area (VTI):   0.74 cm  AV Vmax:      347.00 cm/s  AV Vmean:     264.000 cm/s  AV VTI:      0.690 m  AV Peak Grad:   48.2 mmHg  AV Mean Grad:   32.0 mmHg  LVOT Vmax:     75.40 cm/s  LVOT Vmean:    55.300 cm/s  LVOT VTI:     0.179 m  LVOT/AV VTI ratio: 0.26    AORTA  Ao Root diam: 2.60 cm  Ao Asc diam: 3.00 cm   MITRAL VALVE  MV Area (PHT): 3.19 cm  SHUNTS  MV Decel Time: 238 msec  Systemic VTI: 0.18 m  MV E velocity: 89.10 cm/s Systemic Diam: 1.90 cm  MV A velocity: 90.40 cm/s  MV E/A ratio: 0.99   Lennie Odor MD  Electronically signed by Lennie Odor MD  Signature Date/Time: 05/06/2020/3:10:41 PM     Physicians Panel Physicians Referring Physician Case Authorizing Physician  Corky Crafts, MD (Primary)       Procedures RIGHT/LEFT HEART CATH AND CORONARY ANGIOGRAPHY     Conclusion 1st Mrg lesion is 25% stenosed.  2nd Mrg lesion is 25% stenosed.  The left ventricular systolic function is normal.  LV end diastolic pressure is normal.  The left ventricular ejection fraction is 55-65% by visual estimate.  There is severe aortic valve stenosis. Calculated aortic valve area  0.76 cm.  Aortic saturation 99%, PA saturation 68%, mean PA pressure 12 mmHg, mean PCWP 6 mmHg. Cardiac output 4.0 L/min. Cardiac index 2.16. Nonobstructive coronary artery disease. Severe aortic stenosis.  We will forward to Dr. Clifton James to decide on plans for TAVR.  I was unable to reach her sister, Stann Mainland. No voicemail option was available.            Indications Non-ST elevation (NSTEMI) myocardial infarction (HCC) [I21.4 (ICD-10-CM)]  Nonrheumatic aortic valve stenosis [I35.0 (ICD-10-CM)]     Procedural Details Technical Details The risks, benefits, and details of the procedure were explained to the patient. The patient verbalized understanding and wanted to proceed. Informed written consent was obtained.  PROCEDURE TECHNIQUE: After Xylocaine anesthesia, a 7 French sheath was placed in a vein above the right elbow, and exchanged for a peripheral IV. There is difficulty removing the wire and dilator. The dilator came out very curved from vessel tortuosity. Fluoroscopy was done showing that the wire was in an S curve. We attempted to manipulate the sheath with a Glidewire and an interventional wire to straighten out the venous segment.  We were unsuccessful and the right arm venous sheath was removed. Compression was held. After Xylocaine anesthesia, a 74F sheath was placed in the right radial artery with a single anterior needle wall stick using ultrasound guidance. Right coronary angiography was done using a Judkins R4 guide catheter. Left coronary angiography was done using a Judkins L4 guide catheter. At this point, we obtained right femoral vein access using ultrasound guidance. IV heparin was given. The right heart catheterization was done. A 7 French balloontipped Swan-Ganz catheter was advanced to the pulmonary artery under fluoroscopic guidance. Hemodynamic pressures were obtained. Oxygen saturations were obtained. We then went back to the arterial sheath. Left heart cath was  done using a JR4 catheter.     Contrast: 55 cc            Estimated blood loss <50 mL.   During this procedure medications were administered to achieve and maintain moderate conscious sedation while the patient's heart rate, blood pressure, and oxygen saturation were continuously monitored and I was present face-to-face 100% of this time.     Medications (Filter: Administrations occurring from 05/09/20 0824 to 05/09/20 0943)  Continuous medications are totaled by the amount administered until 05/09/20 0943.  Heparin (Porcine) in NaCl 1000-0.9 UT/500ML-% SOLN (mL) Total volume: 500 mL  Date/Time   Rate/Dose/Volume Action  05/09/20 0826  500 mL Given  Heparin (Porcine) in NaCl 1000-0.9 UT/500ML-% SOLN (mL) Total volume: 500 mL  Date/Time   Rate/Dose/Volume Action  05/09/20 0826  500 mL Given  midazolam (VERSED) injection (mg) Total dose: 2 mg  Date/Time   Rate/Dose/Volume Action  05/09/20 0838  1 mg Given  0900  1 mg Given  fentaNYL (SUBLIMAZE) injection (mcg) Total dose: 50 mcg  Date/Time   Rate/Dose/Volume Action  05/09/20 0838  25 mcg Given  0900  25 mcg Given  lidocaine (PF) (XYLOCAINE) 1 % injection (mL) Total volume: 30 mL  Date/Time   Rate/Dose/Volume Action  05/09/20 0841  10 mL Given  0900  10 mL Given  0914  10 mL Given  Radial Cocktail/Verapamil only (mL) Total volume: 10 mL  Date/Time   Rate/Dose/Volume Action  05/09/20 0902  10 mL Given  ondansetron (ZOFRAN) injection (mg) Total dose: 4 mg  Date/Time   Rate/Dose/Volume Action  05/09/20 0910  4 mg Given  heparin sodium (porcine) injection (Units) Total dose: 4,000 Units  Date/Time   Rate/Dose/Volume Action  05/09/20 0919  4,000 Units Given  iohexol (OMNIPAQUE) 350 MG/ML injection (mL) Total volume: 55 mL  Date/Time   Rate/Dose/Volume Action  05/09/20 0930  55 mL Given  potassium chloride SA (KLOR-CON) CR tablet 40 mEq (mEq) Total dose: Cannot be calculated* Dosing weight: 82.6    *Administration dose not documented  Date/Time   Rate/Dose/Volume Action  05/09/20 0824  *Not included in total MAR Hold  acetaminophen (TYLENOL) tablet 650 mg (mg) Total dose: Cannot be calculated* Dosing weight: 88.9  *Administration dose not documented  Date/Time   Rate/Dose/Volume Action  05/09/20 0824  *Not included in total MAR Hold  aspirin EC tablet 81 mg (mg) Total dose: Cannot be calculated* Dosing weight: 88.9  *Administration dose not documented  Date/Time   Rate/Dose/Volume Action  05/09/20 0824  *Not included in total MAR Hold  atorvastatin (LIPITOR) tablet 40 mg (mg) Total dose: Cannot be calculated* Dosing weight: 88.9  *Administration dose not documented  Date/Time   Rate/Dose/Volume Action  05/09/20 0824  *Not included in total MAR Hold  citalopram (CELEXA) tablet 20 mg (mg)  Total dose: Cannot be calculated*  *Administration dose not documented  Date/Time   Rate/Dose/Volume Action  05/09/20 0824  *Not included in total MAR Hold  ezetimibe (ZETIA) tablet 10 mg (mg) Total dose: Cannot be calculated*  *Administration dose not documented  Date/Time   Rate/Dose/Volume Action  05/09/20 0824  *Not included in total MAR Hold  furosemide (LASIX) injection 40 mg (mg) Total dose: Cannot be calculated* Dosing weight: 88.9  *Administration dose not documented  Date/Time   Rate/Dose/Volume Action  05/09/20 0824  *Not included in total MAR Hold  gabapentin (NEURONTIN) capsule 300 mg (mg) Total dose: Cannot be calculated*  *Administration dose not documented  Date/Time   Rate/Dose/Volume Action  05/09/20 0824  *Not included in total MAR Hold  Gerhardt's butt cream Total dose: Cannot be calculated* Dosing weight: 85.4  *Administration dose not documented  Date/Time   Rate/Dose/Volume Action  05/09/20 0824  *Not included in total MAR Hold  insulin aspart (novoLOG) injection 0-15 Units (Units) Total dose: Cannot be calculated* Dosing weight: 85.4   *Administration dose not documented  Date/Time   Rate/Dose/Volume Action  05/09/20 0824  *Not included in total MAR Hold  insulin aspart (novoLOG) injection 0-5 Units (Units) Total dose: Cannot be calculated* Dosing weight: 85.4  *Administration dose not documented  Date/Time   Rate/Dose/Volume Action  05/09/20 0824  *Not included in total MAR Hold  insulin aspart (novoLOG) injection 8 Units (Units) Total dose: Cannot be calculated* Dosing weight: 85.4  *Administration dose not documented  Date/Time   Rate/Dose/Volume Action  05/09/20 0824  *Not included in total MAR Hold  insulin glargine (LANTUS) injection 20 Units (Units) Total dose: Cannot be calculated* Dosing weight: 85.4  *Administration dose not documented  Date/Time   Rate/Dose/Volume Action  05/09/20 0824  *Not included in total MAR Hold  lisinopril (ZESTRIL) tablet 20 mg (mg) Total dose: Cannot be calculated*  *Administration dose not documented  Date/Time   Rate/Dose/Volume Action  05/09/20 0824  *Not included in total MAR Hold  metoprolol tartrate (LOPRESSOR) tablet 12.5 mg (mg) Total dose: Cannot be calculated* Dosing weight: 88.9  *Administration dose not documented  Date/Time   Rate/Dose/Volume Action  05/09/20 0824  *Not included in total MAR Hold  nitroGLYCERIN (NITROSTAT) SL tablet 0.4 mg (mg) Total dose: Cannot be calculated* Dosing weight: 88.9  *Administration dose not documented  Date/Time   Rate/Dose/Volume Action  05/09/20 0824  *Not included in total MAR Hold  ondansetron (ZOFRAN) injection 4 mg (mg) Total dose: Cannot be calculated* Dosing weight: 88.9  *Administration dose not documented  Date/Time   Rate/Dose/Volume Action  05/09/20 0824  *Not included in total MAR Hold  pantoprazole (PROTONIX) EC tablet 40 mg (mg) Total dose: Cannot be calculated* Dosing weight: 88.9  *Administration dose not documented  Date/Time   Rate/Dose/Volume Action  05/09/20 0824  *Not included in total MAR  Hold     Sedation Time Sedation Time Physician-1: 51 minutes 19 seconds        Contrast Medication Name Total Dose  iohexol (OMNIPAQUE) 350 MG/ML injection 55 mL     Radiation/Fluoro Fluoro time: 7.7 (min)  DAP: 10255 (mGycm2)  Cumulative Air Kerma: 183 (mGy)     Complications Complications documented before study signed (05/09/2020 9:53 AM)  No complications were associated with this study.  Documented by Corky Crafts, MD - 05/09/2020 9:44 AM     Coronary Findings Diagnostic Dominance: Right  Left Anterior Descending  There is mild diffuse disease throughout the vessel.   Left  Circumflex  The vessel exhibits minimal luminal irregularities.   First Obtuse Marginal Branch  1st Mrg lesion 25% stenosed  1st Mrg lesion is 25% stenosed.   Second Obtuse Marginal Branch  2nd Mrg lesion 25% stenosed  2nd Mrg lesion is 25% stenosed.   Right Coronary Artery  The vessel exhibits minimal luminal irregularities.  Intervention No interventions have been documented.          Right Heart Right Heart Pressures Aortic saturation 99%, PA saturation 68%, mean PA pressure 12 mmHg, mean PCWP 6 mmHg. Cardiac output 4.0 L/min. Cardiac index 2.16.              Wall Motion Resting               All segments of the heart are normal.               Left Heart Left Ventricle The left ventricular size is normal. The left ventricular systolic function is normal. LV end diastolic pressure is normal. The left ventricular ejection fraction is 55-65% by visual estimate. No regional wall motion abnormalities.   Aortic Valve There is severe aortic valve stenosis.     Coronary Diagrams Diagnostic Dominance: Right  &&&&&  Intervention      Implants  No implant documentation for this case.      Syngo Images Link to Procedure Log  Show images for CARDIAC CATHETERIZATION Procedure Log     Images on Long Term Storage   Show images for [place], [place]          Hemo Data   Most Recent Value  Fick Cardiac Output 4.07 L/min  Fick Cardiac Output Index 2.16 (L/min)/BSA  Aortic Mean Gradient 33.15 mmHg  Aortic Peak Gradient 42 mmHg  Aortic Valve Area 0.76  Aortic Value Area Index 0.41 cm2/BSA  RA A Wave 3 mmHg  RA V Wave 2 mmHg  RA Mean 1 mmHg  RV Systolic Pressure 23 mmHg  RV Diastolic Pressure 0 mmHg  RV EDP 2 mmHg  PA Systolic Pressure 21 mmHg  PA Diastolic Pressure 7 mmHg  PA Mean 12 mmHg  PW A Wave 4 mmHg  PW V Wave 8 mmHg  PW Mean 6 mmHg  AO Systolic Pressure 129 mmHg  AO Diastolic Pressure 58 mmHg  AO Mean 86 mmHg  LV Systolic Pressure 128 mmHg  LV Diastolic Pressure 1 mmHg  LV EDP 5 mmHg  AOp Systolic Pressure 93 mmHg  AOp Diastolic Pressure 45 mmHg  AOp Mean Pressure 63 mmHg  LVp Systolic Pressure 135 mmHg  LVp Diastolic Pressure 0 mmHg  LVp EDP Pressure 5 mmHg  QP/QS 1  TPVR Index 5.55 HRUI  TSVR Index 32.86 HRUI  PVR SVR Ratio 0.09  TPVR/TSVR Ratio 0.17    ADDENDUM REPORT: 05/12/2020 16:38  CLINICAL DATA:  Severe Aortic Stenosis.  EXAM: Cardiac TAVR CT  TECHNIQUE: The patient was scanned on a [employer]. A 120 kV retrospective scan was triggered in the descending thoracic aorta at 111 HU's. Gantry rotation speed was 250 msecs and collimation was .6 mm. 10 mg IV metoprolol was given. No nitroglycerin was given. The 3D data set was reconstructed in 5% intervals of the R-R cycle. Systolic and diastolic phases were analyzed on a dedicated work station using MPR, MIP and VRT modes. The patient received 80 cc of contrast.  FINDINGS: Image quality: Excellent.  Noise artifact is: Limited.  Valve Morphology: The aortic valve is tricuspid. There leaflets are severely  calcified with restricted leaflet motion in systole. The RCC contains bulky calcifications extending from the leaflet tip to the base of the leaflet.  Aortic Valve Calcium score: 1733  Aortic annular  dimension:  Phase assessed: 25%  Annular area: 421 mm2  Annular perimeter: 74.2 mm  Max diameter: 26.0 mm  Min diameter: 21.2 mm  Annular and subannular calcification: No significant annular or subannular calcifications.  Optimal coplanar projection: RAO 5, CRA 3  Coronary Artery Height above Annulus:  Left Main: 12.9 mm  Right Coronary: 15.4 mm  Sinus of Valsalva Measurements:  Non-coronary: 27 mm  Right-coronary: 27 mm  Left-coronary: 27 mm  Sinus of Valsalva Height:  Non-coronary: 19.0 mm  Right-coronary: 19.8 mm  Left-coronary: 19.1 mm  Sinotubular Junction: 25 mm.  Mild calcified plaque.  Ascending Thoracic Aorta: 32 mm.  Minimal calcified plaque.  Coronary Arteries: Normal coronary origin. Right dominance. The study was performed without use of NTG and is insufficient for plaque evaluation. Please refer to recent cardiac catheterization for coronary assessment.  Cardiac Morphology:  Right Atrium: Right atrial size is within normal limits.  Right Ventricle: The right ventricular cavity is within normal limits.  Left Atrium: Left atrial size is normal in size with no left atrial appendage filling defect.  Left Ventricle: The ventricular cavity size is within normal limits. There are no stigmata of prior infarction. There is no abnormal filling defect. Hyperdynamic LV function, LVEF=79%. No regional wall motion abnormalities.  Pulmonary arteries: Normal in size without proximal filling defect.  Pulmonary veins: Normal pulmonary venous drainage.  Pericardium: Normal thickness with no significant effusion or calcium present.  Mitral Valve: The mitral valve is normal structure with mild mitral annular calcification.  Extra-cardiac findings: See attached radiology report for non-cardiac structures.  IMPRESSION: 1. Annular measurements appropriate for 23 mm Sapien 3 TAVR (421 mm2).  2. No significant  annular or subannular calcifications.  3. Sufficient coronary to annulus distance.  4. Optimal Fluoroscopic Angle for Delivery: RAO 5 CRA 3  [place] T. Audie Box, MD   Electronically Signed   By: Eleonore Chiquito   On: 05/12/2020 16:38   Addended by Geralynn Rile, MD on 05/12/2020 4:41 PM    Study Result  EXAM: OVER-READ INTERPRETATION  CT CHEST  The following report is an over-read performed by radiologist Dr. Vinnie Langton of [facility], [place] on 05/12/2020. This over-read does not include interpretation of cardiac or coronary anatomy or pathology. The coronary calcium score/coronary CTA interpretation by the cardiologist is attached.  COMPARISON:  Chest CTA 05/11/2020.  FINDINGS: Extracardiac findings will be described separately under dictation for contemporaneously obtained CTA chest, abdomen and pelvis.  IMPRESSION: Please see separate dictation for contemporaneously obtained CTA chest, abdomen and pelvis dated 05/12/2020 for full description of relevant extracardiac findings.  Electronically Signed: By: Vinnie Langton M.D. On: 05/12/2020 15:53       CLINICAL DATA:  74 year old female with history of severe aortic stenosis. Preprocedural study prior to potential transcatheter aortic valve replacement (TAVR) procedure.  EXAM: CT ANGIOGRAPHY CHEST, ABDOMEN AND PELVIS  TECHNIQUE: Chest CTA 05/11/2020.  CT the abdomen and pelvis 03/27/2014.  Multidetector CT imaging through the chest, abdomen and pelvis was performed using the standard protocol during bolus administration of intravenous contrast. Multiplanar reconstructed images and MIPs were obtained and reviewed to evaluate the vascular anatomy.  CONTRAST:  164mL OMNIPAQUE IOHEXOL 350 MG/ML SOLN  COMPARISON:  None.  FINDINGS: CTA CHEST FINDINGS  Cardiovascular: Heart size is normal. There is no  significant pericardial fluid, thickening or pericardial calcification.  There is aortic atherosclerosis, as well as atherosclerosis of the great vessels of the mediastinum and the coronary arteries, including calcified atherosclerotic plaque in the left main, left anterior descending, left circumflex and right coronary arteries. Thickening calcification of the aortic valve. Calcifications of the mitral annulus.  Mediastinum/Lymph Nodes: No pathologically enlarged mediastinal or hilar lymph nodes. Esophagus is unremarkable in appearance. No axillary lymphadenopathy.  Lungs/Pleura: No suspicious appearing pulmonary nodules or masses are noted. No acute consolidative airspace disease. No pleural effusions.  Musculoskeletal/Soft Tissues: There are no aggressive appearing lytic or blastic lesions noted in the visualized portions of the skeleton.  CTA ABDOMEN AND PELVIS FINDINGS  Hepatobiliary: No suspicious cystic or solid hepatic lesions. No intra or extrahepatic biliary ductal dilatation. Status post cholecystectomy.  Pancreas: No pancreatic mass. No pancreatic ductal dilatation. No pancreatic or peripancreatic fluid collections or inflammatory changes.  Spleen: Unremarkable.  Adrenals/Urinary Tract: Bilateral kidneys and adrenal glands are normal in appearance. No hydroureteronephrosis. Urinary bladder is normal in appearance.  Stomach/Bowel: Normal appearance of the stomach. No pathologic dilatation of small bowel or colon. Numerous colonic diverticulae are noted, most severe in the descending colon and sigmoid colon, without surrounding inflammatory changes to suggest an acute diverticulitis at this time. Normal appendix.  Vascular/Lymphatic: Aortic atherosclerosis, without evidence of aneurysm or dissection in the abdominal or pelvic vasculature. Vascular findings and measurements pertinent to potential TAVR procedure, as detailed below. No lymphadenopathy noted in the abdomen or pelvis.  Reproductive: Status post hysterectomy.  Ovaries are not confidently identified and may be surgically absent or atrophic.  Other: Large umbilical hernia containing only omental fat. No significant volume of ascites. No pneumoperitoneum.  Musculoskeletal: Chronic appearing compression fracture at L1 with 25% loss of anterior vertebral body height. There are no aggressive appearing lytic or blastic lesions noted in the visualized portions of the skeleton.  VASCULAR MEASUREMENTS PERTINENT TO TAVR:  AORTA:  Minimal Aortic Diameter-12 x 12 mm  Severity of Aortic Calcification-severe  RIGHT PELVIS:  Right Common Iliac Artery -  Minimal Diameter-10.3 x 9.1 mm  Tortuosity - mild  Calcification-mild  Right External Iliac Artery -  Minimal Diameter-6.5 x 6.7 mm  Tortuosity - mild  Calcification-none  Right Common Femoral Artery -  Minimal Diameter-7.6 x 7.1 mm  Tortuosity - mild  Calcification-none  LEFT PELVIS:  Left Common Iliac Artery -  Minimal Diameter-10.1 x 9.9 mm  Tortuosity - mild  Calcification-mild  Left External Iliac Artery -  Minimal Diameter-7.2 x 6.6 mm  Tortuosity - mild  Calcification-none  Left Common Femoral Artery -  Minimal Diameter-7.3 x 6.9 mm  Tortuosity-mild  Calcification-mild  Review of the MIP images confirms the above findings.  IMPRESSION: 1. Vascular findings and measurements pertinent to potential TAVR procedure, as detailed above. 2. Severe thickening calcification of the aortic valve, compatible with reported clinical history of severe aortic stenosis. 3. Aortic atherosclerosis, in addition to left main and 3 vessel coronary artery disease. 4. Severe colonic diverticulosis without evidence of acute diverticulitis at this time. 5. Additional incidental findings, as above.   Electronically Signed   By: Trudie Reed M.D.   On: 05/12/2020 17:00   STS RISK CALCULATOR: Isolated AVR: Risk of  Mortality: 2.038% Renal Failure: 1.204% Permanent Stroke: 1.768% Prolonged Ventilation: 7.453% DSW Infection: 0.158% Reoperation: 2.741% Morbidity or Mortality: 12.230% Short Length of Stay: 33.856% Long Length of Stay: 5.652% _______________________  [facility] Cardiomyopathy Questionnaire  KCCQ-12 05/12/2020  1 a. Ability  to shower/bathe Slightly limited  1 b. Ability to walk 1 block Extremely limited  1 c. Ability to hurry/jog Other, Did not do  2. Edema feet/ankles/legs Every morning  3. Limited by fatigue All of the time  4. Limited by dyspnea All of the time  5. Sitting up / on 3+ pillows Every night  6. Limited enjoyment of life Extremely limited  7. Rest of life w/ symptoms Not at all satisfied  8 a. Participation in hobbies Limited quite a bit  8 b. Participation in chores Limited quite a bit  8 c. Visiting family/friends N/A, did not do for other reasons      Impression:  This 74 year old woman has stage D, severe, symptomatic aortic stenosis with New York Heart Association class II symptoms of exertional fatigue as well as dizziness and recurrent syncope and lower extremity edema consistent with chronic diastolic congestive heart failure.  I have personally reviewed her 2D echocardiogram, cardiac catheterization, and CTA studies.  Her echocardiogram shows a calcified aortic valve with a mean gradient of 32 mmHg and a measured valve area of 0.74 cm consistent with severe aortic stenosis.  Left ventricular ejection fraction 55 to 60%.  Cardiac catheterization showed nonobstructive coronary disease.  Calculated aortic valve area was 0.76 cm consistent with severe aortic LDL I agree that aortic valve placement is indicated in this patient with severe aortic stenosis and recurrent syncope.  I think transcatheter aortic valve replacement would be the best option for her.  Her gated cardiac CTA shows anatomy suitable for TAVR using a SAPIEN 3 valve.  Abdominal and pelvic  CTA shows adequate pelvic vascular anatomy to allow transfemoral insertion.  The patient was counseled at length regarding treatment alternatives for management of severe symptomatic aortic stenosis. The risks and benefits of surgical intervention has been discussed in detail. Long-term prognosis with medical therapy was discussed. Alternative approaches such as conventional surgical aortic valve replacement, transcatheter aortic valve replacement, and palliative medical therapy were compared and contrasted at length. This discussion was placed in the context of the patient's own specific clinical presentation and past medical history. All of her questions have been addressed.   Following the decision to proceed with transcatheter aortic valve replacement, a discussion was held regarding what types of management strategies would be attempted intraoperatively in the event of life-threatening complications, including whether or not the patient would be considered a candidate for the use of cardiopulmonary bypass and/or conversion to open sternotomy for attempted surgical intervention. The patient is aware of the fact that transient use of cardiopulmonary bypass may be necessary.  I think she would be a candidate for emergent sternotomy needed to manage any intraoperative complications.  The patient has been advised of a variety of complications that might develop including but not limited to risks of death, stroke, paravalvular leak, aortic dissection or other major vascular complications, aortic annulus rupture, device embolization, cardiac rupture or perforation, mitral regurgitation, acute myocardial infarction, arrhythmia, heart block or bradycardia requiring permanent pacemaker placement, congestive heart failure, respiratory failure, renal failure, pneumonia, infection, other late complications related to structural valve deterioration or migration, or other complications that might ultimately cause a  temporary or permanent loss of functional independence or other long term morbidity. The patient provides full informed consent for the procedure as described and all questions were answered.      Plan:  She will be scheduled for transfemoral transcatheter aortic valve replacement on Wednesday, 05/17/2020.  I spent 60 minutes performing this consultation  and > 50% of this time was spent face to face counseling and coordinating the care of this patient's severe symptomatic aortic stenosis.      Alleen Borne, MD 05/16/2020 9:55 AM

## 2020-05-16 NOTE — Progress Notes (Addendum)
[facility]   MULTIDISCIPLINARY HEART VALVE TEAM  Patient Name: [name] Date of Encounter: 05/16/2020  Primary Cardiologist: Dr. Williemae Area Problem List     Principal Problem:   Syncope Active Problems:   Type II diabetes mellitus with neurological manifestations, uncontrolled (HCC)   Hyperlipidemia associated with type 2 diabetes mellitus (HCC)   Essential hypertension   AKI (acute kidney injury) (HCC)   Aortic stenosis   Pressure injury of skin   Orthostatic hypotension   Dental caries     Subjective   No complaints. Very excited for surgery tomorrow. Has a sister in law who can stay with her after TAVR  Inpatient Medications    Scheduled Meds: . aspirin EC  81 mg Oral Daily  . atorvastatin  40 mg Oral Daily  . citalopram  20 mg Oral Daily  . ezetimibe  10 mg Oral Daily  . gabapentin  300 mg Oral TID  . insulin aspart  0-15 Units Subcutaneous TID WC  . insulin aspart  0-5 Units Subcutaneous QHS  . insulin aspart protamine- aspart  15 Units Subcutaneous BID WC  . [START ON 05/17/2020] magnesium sulfate  40 mEq Other To OR  . metoprolol tartrate  12.5 mg Oral BID  . pantoprazole  40 mg Oral Daily  . [START ON 05/17/2020] potassium chloride  80 mEq Other To OR  . sodium chloride flush  3 mL Intravenous Q12H   Continuous Infusions: . [START ON 05/17/2020] cefUROXime (ZINACEF)  IV    . [START ON 05/17/2020] dexmedetomidine    . [START ON 05/17/2020] heparin 30,000 units/NS 1000 mL solution for CELLSAVER    . [START ON 05/17/2020] norepinephrine (LEVOPHED) Adult infusion    . [START ON 05/17/2020] vancomycin     PRN Meds: acetaminophen **OR** acetaminophen, alum & mag hydroxide-simeth, naphazoline-glycerin   Vital Signs    Vitals:   05/15/20 1745 05/15/20 1811 05/15/20 2335 05/16/20 0531  BP: 140/68 123/73 (!) 118/55 (!) 114/58  Pulse:  82 69 66  Resp:  18 19 17   Temp:  98.9 F (37.2 C) 98.4 F (36.9 C) 97.9 F (36.6 C)  TempSrc:   Oral Oral Oral  SpO2:  96%  94%  Weight:    81 kg  Height:        Intake/Output Summary (Last 24 hours) at 05/16/2020 1022 Last data filed at 05/16/2020 0701 Gross per 24 hour  Intake 240 ml  Output --  Net 240 ml   Filed Weights   05/14/20 0437 05/15/20 0500 05/16/20 0531  Weight: 82.9 kg 82 kg 81 kg    Physical Exam   GEN: Well nourished, well developed, in no acute distress. obese HEENT: Grossly normal.  Neck: Supple, no JVD, carotid bruits, or masses. Cardiac: RRR, 3/6 mid peaking harsh systolic murmur at the right upper sternal border. No rubs, or gallops. No clubbing, cyanosis. 1 + bilateral edema Respiratory:  Respirations regular and unlabored, clear to auscultation bilaterally. GI: Soft, nontender, nondistended, BS + x 4. MS: no deformity or atrophy. Skin: warm and dry, no rash. Neuro:  Strength and sensation are intact. Psych: AAOx3.  Normal affect.  Labs    CBC Recent Labs    05/14/20 0445  WBC 8.1  HGB 13.2  HCT 41.9  MCV 85.9  PLT 239   Basic Metabolic Panel Recent Labs    07/14/20 0445  NA 139  K 4.8  CL 101  CO2 29  GLUCOSE 111*  BUN 16  CREATININE 0.73  CALCIUM 8.9   Liver Function Tests No results for input(s): AST, ALT, ALKPHOS, BILITOT, PROT, ALBUMIN in the last 72 hours. No results for input(s): LIPASE, AMYLASE in the last 72 hours. Cardiac Enzymes No results for input(s): CKTOTAL, CKMB, CKMBINDEX, TROPONINI in the last 72 hours. BNP Invalid input(s): POCBNP D-Dimer No results for input(s): DDIMER in the last 72 hours. Hemoglobin A1C No results for input(s): HGBA1C in the last 72 hours. Fasting Lipid Panel No results for input(s): CHOL, HDL, LDLCALC, TRIG, CHOLHDL, LDLDIRECT in the last 72 hours. Thyroid Function Tests Recent Labs    05/14/20 0445  TSH 4.000    ECG    Sinus - Personally Reviewed  Radiology    No results found.  Cardiac Studies   Coronary CT 05/15/20 ADDENDUM REPORT: 05/12/2020 16:38  CLINICAL  DATA:  Severe Aortic Stenosis.  EXAM: Cardiac TAVR CT  TECHNIQUE: The patient was scanned on a [employer]. A 120 kV retrospective scan was triggered in the descending thoracic aorta at 111 HU's. Gantry rotation speed was 250 msecs and collimation was .6 mm. 10 mg IV metoprolol was given. No nitroglycerin was given. The 3D data set was reconstructed in 5% intervals of the R-R cycle. Systolic and diastolic phases were analyzed on a dedicated work station using MPR, MIP and VRT modes. The patient received 80 cc of contrast.  FINDINGS: Image quality: Excellent.  Noise artifact is: Limited.  Valve Morphology: The aortic valve is tricuspid. There leaflets are severely calcified with restricted leaflet motion in systole. The RCC contains bulky calcifications extending from the leaflet tip to the base of the leaflet.  Aortic Valve Calcium score: 1733  Aortic annular dimension:  Phase assessed: 25%  Annular area: 421 mm2  Annular perimeter: 74.2 mm  Max diameter: 26.0 mm  Min diameter: 21.2 mm  Annular and subannular calcification: No significant annular or subannular calcifications.  Optimal coplanar projection: RAO 5, CRA 3  Coronary Artery Height above Annulus:  Left Main: 12.9 mm  Right Coronary: 15.4 mm  Sinus of Valsalva Measurements:  Non-coronary: 27 mm  Right-coronary: 27 mm  Left-coronary: 27 mm  Sinus of Valsalva Height:  Non-coronary: 19.0 mm  Right-coronary: 19.8 mm  Left-coronary: 19.1 mm  Sinotubular Junction: 25 mm.  Mild calcified plaque.  Ascending Thoracic Aorta: 32 mm.  Minimal calcified plaque.  Coronary Arteries: Normal coronary origin. Right dominance. The study was performed without use of NTG and is insufficient for plaque evaluation. Please refer to recent cardiac catheterization for coronary assessment.  Cardiac Morphology:  Right Atrium: Right atrial size is within normal  limits.  Right Ventricle: The right ventricular cavity is within normal limits.  Left Atrium: Left atrial size is normal in size with no left atrial appendage filling defect.  Left Ventricle: The ventricular cavity size is within normal limits. There are no stigmata of prior infarction. There is no abnormal filling defect. Hyperdynamic LV function, LVEF=79%. No regional wall motion abnormalities.  Pulmonary arteries: Normal in size without proximal filling defect.  Pulmonary veins: Normal pulmonary venous drainage.  Pericardium: Normal thickness with no significant effusion or calcium present.  Mitral Valve: The mitral valve is normal structure with mild mitral annular calcification.  Extra-cardiac findings: See attached radiology report for non-cardiac structures.  IMPRESSION: 1. Annular measurements appropriate for 23 mm Sapien 3 TAVR (421 mm2).  2. No significant annular or subannular calcifications.  3. Sufficient coronary to annulus distance.  4. Optimal Fluoroscopic Angle for Delivery: RAO 5  CRA 3  Dragoon T. Flora Lipps, MD   Electronically Signed   By: Lennie Odor   On: 05/12/2020 16:38    ___________________   CT angio 05/12/20 CLINICAL DATA:  74 year old female with history of severe aortic stenosis. Preprocedural study prior to potential transcatheter aortic valve replacement (TAVR) procedure.  EXAM: CT ANGIOGRAPHY CHEST, ABDOMEN AND PELVIS  TECHNIQUE: Chest CTA 05/11/2020.  CT the abdomen and pelvis 03/27/2014.  Multidetector CT imaging through the chest, abdomen and pelvis was performed using the standard protocol during bolus administration of intravenous contrast. Multiplanar reconstructed images and MIPs were obtained and reviewed to evaluate the vascular anatomy.  CONTRAST:  OMNIPAQUE IOHEXOL 350 MG/ML SOLN  COMPARISON:  None.  FINDINGS: CTA CHEST FINDINGS  Cardiovascular: Heart size is normal. There is no  significant pericardial fluid, thickening or pericardial calcification. There is aortic atherosclerosis, as well as atherosclerosis of the great vessels of the mediastinum and the coronary arteries, including calcified atherosclerotic plaque in the left main, left anterior descending, left circumflex and right coronary arteries. Thickening calcification of the aortic valve. Calcifications of the mitral annulus.  Mediastinum/Lymph Nodes: No pathologically enlarged mediastinal or hilar lymph nodes. Esophagus is unremarkable in appearance. No axillary lymphadenopathy.  Lungs/Pleura: No suspicious appearing pulmonary nodules or masses are noted. No acute consolidative airspace disease. No pleural effusions.  Musculoskeletal/Soft Tissues: There are no aggressive appearing lytic or blastic lesions noted in the visualized portions of the skeleton.  CTA ABDOMEN AND PELVIS FINDINGS  Hepatobiliary: No suspicious cystic or solid hepatic lesions. No intra or extrahepatic biliary ductal dilatation. Status post cholecystectomy.  Pancreas: No pancreatic mass. No pancreatic ductal dilatation. No pancreatic or peripancreatic fluid collections or inflammatory changes.  Spleen: Unremarkable.  Adrenals/Urinary Tract: Bilateral kidneys and adrenal glands are normal in appearance. No hydroureteronephrosis. Urinary bladder is normal in appearance.  Stomach/Bowel: Normal appearance of the stomach. No pathologic dilatation of small bowel or colon. Numerous colonic diverticulae are noted, most severe in the descending colon and sigmoid colon, without surrounding inflammatory changes to suggest an acute diverticulitis at this time. Normal appendix.  Vascular/Lymphatic: Aortic atherosclerosis, without evidence of aneurysm or dissection in the abdominal or pelvic vasculature. Vascular findings and measurements pertinent to potential TAVR procedure, as detailed below. No lymphadenopathy  noted in the abdomen or pelvis.  Reproductive: Status post hysterectomy. Ovaries are not confidently identified and may be surgically absent or atrophic.  Other: Large umbilical hernia containing only omental fat. No significant volume of ascites. No pneumoperitoneum.  Musculoskeletal: Chronic appearing compression fracture at L1 with 25% loss of anterior vertebral body height. There are no aggressive appearing lytic or blastic lesions noted in the visualized portions of the skeleton.  VASCULAR MEASUREMENTS PERTINENT TO TAVR:  AORTA:  Minimal Aortic Diameter-12 x 12 mm  Severity of Aortic Calcification-severe  RIGHT PELVIS:  Right Common Iliac Artery -  Minimal Diameter-10.3 x 9.1 mm  Tortuosity - mild  Calcification-mild  Right External Iliac Artery -  Minimal Diameter-6.5 x 6.7 mm  Tortuosity - mild  Calcification-none  Right Common Femoral Artery -  Minimal Diameter-7.6 x 7.1 mm  Tortuosity - mild  Calcification-none  LEFT PELVIS:  Left Common Iliac Artery -  Minimal Diameter-10.1 x 9.9 mm  Tortuosity - mild  Calcification-mild  Left External Iliac Artery -  Minimal Diameter-7.2 x 6.6 mm  Tortuosity - mild  Calcification-none  Left Common Femoral Artery -  Minimal Diameter-7.3 x 6.9 mm  Tortuosity-mild  Calcification-mild  Review of the MIP images confirms  the above findings.  IMPRESSION: 1. Vascular findings and measurements pertinent to potential TAVR procedure, as detailed above. 2. Severe thickening calcification of the aortic valve, compatible with reported clinical history of severe aortic stenosis. 3. Aortic atherosclerosis, in addition to left main and 3 vessel coronary artery disease. 4. Severe colonic diverticulosis without evidence of acute diverticulitis at this time. 5. Additional incidental findings, as above.   Patient Profile     [name] is a 74 y.o. female  with a history of HTN, HLD, uncontrolled DMT2, chronic LE edema, severe AS (undergoing TAVR work up) with recent admission for chest pain with NSTEMI (cath showed non obst CAD) who presented to [facility] on 05/10/20 with syncope felt to be related to orthostasis and dehydration. Pt has remained inpatient for TAVR work up and surgery.  Assessment & Plan    Severe AS: plan is for TAVR tomorrow at 2pm with Dr. Excell Seltzer and Dr. Laneta Simmers. Stop Lovenox now. Will place inpatient TAVR orders. Please see Dr. Sharee Pimple full consult note from today. She has a sister in law who can stay with her after TAVR, so she may not need to go to a ST-SNF. Will have PT re-eval.   Syncope: felt to be related to orthostatic hypotension. Orthostatic vital signs positive and lab work showed AKI. She has improved with holding antihypertensive and gentle hydration.  AKI: creat back to baseline. 1.14--> 0.73.  HTN: BP well controlled off antihypertensives.   T2DM: continue SSI per primary team.    Signed, Cline Crock, PA-C  05/16/2020, 10:22 AM  Pager [phone]  Patient seen, examined. Available data reviewed. Agree with findings, assessment, and plan as outlined by Carlean Jews, PA-C.  Patient is independently interviewed and examined.  She is doing well with no complaints this morning.  Lungs are clear, heart is regular rate and rhythm with a 2/6 harsh systolic murmur at the right upper sternal border, abdomen is soft and nontender, extremities have mild pretibial edema stable from previous exams.  Telemetry is reviewed and the patient did have a prolonged episode of SVT with a heart rate in the 130s yesterday evening.  She has maintained sinus rhythm since then.  She experienced heart palpitations with this but had no other associated symptoms.  I have reviewed all of her preoperative studies and discussed her case with Dr. Laneta Simmers and are multidisciplinary heart valve team.  We agree that she is a good candidate for transfemoral  TAVR which is planned for tomorrow.  Tonny Bollman, M.D. 05/16/2020 11:10 AM

## 2020-05-16 NOTE — Progress Notes (Signed)
Occupational Therapy Treatment Patient Details Name: [name] MRN: [id number] DOB: [birth year] Today's Date: 05/16/2020    History of present illness Pt is a 74 y/o female admitted secondary to syncope and positive orthostatics. Pt with recent admission secondary to severe aortic stenosis and underwent cardica cath. PMH includes DM, HTN, and gerd.    OT comments  Pt making steady progress towards OT goals this session. Session focus on standing ADLs, toileting tasks and LB ADLs. Overall, pt requires supervision - min guard for standing ADLs at sink with RW; pt able to stand ~ 10 mins to complete UB ADLs with no c/o dizziness. Pt completed toileting with min guard - MIN A needing light MIN A to power up from toilet; supervision for hygiene. Pt completed LB ADLs with MOD A. Pt with improved activity tolerance this session with VSS. Agree that pt may progress to [facility] as recommended by evaluating OTR. Pt for TAVR procedure 6/9; will follow acutely per POC.   BP supine: 117/59 BP post mobility: 110/70   Follow Up Recommendations  SNF;Supervision - Intermittent;Other (comment)(Possible progress to [facility])    Equipment Recommendations  3 in 1 bedside commode    Recommendations for Other Services      Precautions / Restrictions Precautions Precautions: Fall;Other (comment) Precaution Comments: watch BP  Restrictions Weight Bearing Restrictions: No       Mobility Bed Mobility Overal bed mobility: Modified Independent Bed Mobility: Supine to Sit     Supine to sit: Modified independent (Device/Increase time)     General bed mobility comments: no assist needed.  Transfers Overall transfer level: Needs assistance Equipment used: Rolling walker (2 wheeled) Transfers: Sit to/from Stand Sit to Stand: Min guard;Min assist         General transfer comment: min guard from EOB with cues for hand placement; min A to power up from toilet with use of grab bars    Balance  Overall balance assessment: Needs assistance Sitting-balance support: Feet supported;No upper extremity supported Sitting balance-Leahy Scale: Good     Standing balance support: During functional activity;No upper extremity supported Standing balance-Leahy Scale: Fair Standing balance comment: close supervision for dynamic tasks                           ADL either performed or assessed with clinical judgement   ADL Overall ADL's : Needs assistance/impaired     Grooming: Wash/dry hands;Wash/dry face;Standing;Brushing hair;Supervision/safety;Min guard Grooming Details (indicate cue type and reason): pt stood ~ 10 mins to wash hair with supervision for safety with no c/o dizziness Upper Body Bathing: Supervision/ safety;Standing           Lower Body Dressing: Moderate assistance;Sit to/from stand;Minimal assistance Lower Body Dressing Details (indicate cue type and reason): to don new underwear; MOD A to thread BLES with pt able to pull up to waist line in standing with MINA to manage gown in standing Toilet Transfer: Min guard;Ambulation;RW;Grab bars;Minimal assistance Toilet Transfer Details (indicate cue type and reason): min guard for safety with RW; light MIN A to power up from toilet with use of grab bars Toileting- Clothing Manipulation and Hygiene: Supervision/safety;Sit to/from stand       Functional mobility during ADLs: Min guard;Rolling walker General ADL Comments: pt able to stand to complete ADLs ~ 10 mins with supervision- min guard with no c/o dizziness     Vision       Perception     Praxis  Cognition Arousal/Alertness: Awake/alert Behavior During Therapy: WFL for tasks assessed/performed Overall Cognitive Status: Within Functional Limits for tasks assessed                                 General Comments: very pleasant and appreciative of therapy        Exercises Other Exercises Other Exercises: instructed pt on  functional therex from supine such as ankle pumps; hip ADD/ ABD, straight leg raise, sh flexion/ extension, elbow flex/ extension, horizontal sh ABD/ ADD   Shoulder Instructions       General Comments pt with no c/o dizziness during session; BP supine 117/59; BP post mobility 110/70. pt reports SIL can stay with her post TAVR; interested in shower chair for walkin shower    Pertinent Vitals/ Pain       Pain Assessment: No/denies pain  Home Living                                          Prior Functioning/Environment              Frequency  Min 2X/week        Progress Toward Goals  OT Goals(current goals can now be found in the care plan section)  Progress towards OT goals: Progressing toward goals  Acute Rehab OT Goals Patient Stated Goal: To go home to own house OT Goal Formulation: With patient Time For Goal Achievement: 05/26/20 Potential to Achieve Goals: Good  Plan Discharge plan remains appropriate    Co-evaluation                 AM-PAC OT "6 Clicks" Daily Activity     Outcome Measure   Help from another person eating meals?: None Help from another person taking care of personal grooming?: A Little Help from another person toileting, which includes using toliet, bedpan, or urinal?: A Little Help from another person bathing (including washing, rinsing, drying)?: A Lot Help from another person to put on and taking off regular upper body clothing?: A Little Help from another person to put on and taking off regular lower body clothing?: A Lot 6 Click Score: 17    End of Session Equipment Utilized During Treatment: Rolling walker  OT Visit Diagnosis: Unsteadiness on feet (R26.81);Repeated falls (R29.6);History of falling (Z91.81);Muscle weakness (generalized) (M62.81);Pain Pain - Right/Left: Left Pain - part of body: Knee   Activity Tolerance Patient tolerated treatment well   Patient Left in bed;with call bell/phone within  reach   Nurse Communication Mobility status        Time: 1335-1416 OT Time Calculation (min): 41 min  Charges: OT General Charges $OT Visit: 1 Visit OT Treatments $Self Care/Home Management : 23-37 mins $Therapeutic Activity: 8-22 mins  Audery Amel., COTA/L Acute Rehabilitation Services [phone]    Angelina Pih 05/16/2020, 2:45 PM

## 2020-05-16 NOTE — Progress Notes (Signed)
Progress Note    [name]  OIZ:[id number] DOB: 1946-06-20  DOA: 05/10/2020 PCP: Hoyt Koch, MD    Brief Narrative:     Medical records reviewed and are as summarized below:  [name] is an 74 y.o. female with medical history significant of hypertension, hyperlipidemia, CAD, severe aortic stenosis, diabetes mellitus type 2, and chronic back pain who presents after having a fall with loss of consciousness.  She had just been recently hospitalized from 5/28-6/2 with complaints of chest pain with NSTEMI.  She underwent cardiac cath which showed nonobstructive disease.  Since leaving the hospital patient reports that she has been constantly dizzy.  She is woken up on the floor at least 3-4 times.  Denies having any chest pain or shortness of breath.  She had been nauseated 3 to 4 days prior to coming into the hospital initially on 5/28, but reports that those symptoms have now resolved.  Plan is for TAVR on 6/9.  Would get PT to reeval patient afterwards to determine if she needs to go to skilled nursing facility for rehabilitation or to go with family and home health   Assessment/Plan:   Principal Problem:   Syncope Active Problems:   Type II diabetes mellitus with neurological manifestations, uncontrolled ([place])   Hyperlipidemia associated with type 2 diabetes mellitus ([place])   Essential hypertension   AKI (acute kidney injury) ([place])   Aortic stenosis   Pressure injury of skin   Orthostatic hypotension   Dental caries   Syncope Orthostatic hypotension: Acute.  Patient presents after having multiple syncopal episodes at home.  CTA of the chest did not note any signs of a pulmonary embolus.  Patient denies any complaints of chest pain.  Orthostatic vital signs noted to be positive.  Suspect patient has likely been over diuresed during last hospitalization. -per cards: TAVR to be done 6/9  Urinary tract infection: Acute.  She reports having dysuria.   UA positive for signs of infection.  -Urine culture: multiple species -Rocephin IV x3 days completed  Acute kidney injury:  -resolved  Aortic stenosis: Cardiology would like to have CT angiogram studies of the heart for better characterization of the patient's aortic stenosis.  As well as of the chest abdomen and pelvis.   -per cards, plan for TAVR on 6/  Essential hypertension:  -per cards  Dental caries: Patient noted to have multiple missing teeth with dental caries involving 3 of the residual segment mandibular teeth. -Patient need to follow up with dentist at discharge  Diabetes mellitus type 2: On admission blood sugar elevated to 261.  Home medications include Metformin 500 mg twice daily and Humalog75/25 mix 45 units twice daily -adjust insulin  Dyslipidemia: Home medications include Zetia 10 mg daily and atorvastatin 40 mg daily. -Continue current home regimen  GERD: Home medications include Nexium 40 mg daily. -Continue pharmacy substitution of Protonix  obesity Body mass index is 30.65 kg/m.   Family Communication/Anticipated D/C date and plan/Code Status   DVT prophylaxis: Lovenox ordered. Code Status: Full Code.  Disposition Plan: Status is: Inpatient  Remains inpatient appropriate because:Unsafe d/c plan   Dispo: The patient is from: Home              Anticipated d/c is to: TBD: home vs SNF              Anticipated d/c date is: 4-5 days              Patient  currently is NOT medically stable to d/c.     Medical Consultants:    cards  Subjective:   Patient relieved that her TAVR is planned for tomorrow Had episode of tachycardia last p.m. resolved with home beta-blockers  Objective:    Vitals:   05/15/20 1745 05/15/20 1811 05/15/20 2335 05/16/20 0531  BP: 140/68 123/73 (!) 118/55 (!) 114/58  Pulse:  82 69 66  Resp:  18 19 17   Temp:  98.9 F (37.2 C) 98.4 F (36.9 C) 97.9 F (36.6 C)  TempSrc:  Oral Oral Oral  SpO2:  96%  94%    Weight:    81 kg  Height:        Intake/Output Summary (Last 24 hours) at 05/16/2020 1147 Last data filed at 05/16/2020 0701 Gross per 24 hour  Intake 240 ml  Output --  Net 240 ml   Filed Weights   05/14/20 0437 05/15/20 0500 05/16/20 0531  Weight: 82.9 kg 82 kg 81 kg    Exam:    General: Appearance:    Obese female in no acute distress     Lungs:     Clear to auscultation bilaterally, respirations unlabored  Heart:    Normal heart rate. Normal rhythm.  +murmur  MS:   All extremities are intact.   Neurologic:   Awake, alert, oriented x 3. No apparent focal neurological           defect.      Data Reviewed:   I have personally reviewed following labs and imaging studies:  Labs: Labs show the following:   Basic Metabolic Panel: Recent Labs  Lab 05/10/20 2359 05/10/20 2359 05/12/20 0752 05/12/20 0752 05/13/20 0708 05/14/20 0445  NA 134*  --  136  --  138 139  K 4.5   < > 4.2   < > 4.3 4.8  CL 95*  --  99  --  98 101  CO2 29  --  27  --  30 29  GLUCOSE 261*  --  142*  --  95 111*  BUN 33*  --  17  --  18 16  CREATININE 1.14*  --  0.70  --  0.70 0.73  CALCIUM 8.9  --  9.1  --  9.4 8.9   < > = values in this interval not displayed.   GFR Estimated Creatinine Clearance: 64.5 mL/min (by C-G formula based on SCr of 0.73 mg/dL). Liver Function Tests: Recent Labs  Lab 05/10/20 2359  AST 44*  ALT 46*  ALKPHOS 179*  BILITOT 0.5  PROT 7.4  ALBUMIN 3.2*   No results for input(s): LIPASE, AMYLASE in the last 168 hours. No results for input(s): AMMONIA in the last 168 hours. Coagulation profile No results for input(s): INR, PROTIME in the last 168 hours.  CBC: Recent Labs  Lab 05/10/20 0154 05/10/20 2359 05/12/20 0752 05/13/20 0708 05/14/20 0445  WBC 9.2 15.0* 9.6 9.2 8.1  NEUTROABS  --  11.1*  --   --   --   HGB 14.7 14.8 13.5 14.2 13.2  HCT 45.5 46.3* 42.4 44.9 41.9  MCV 83.8 84.8 85.0 86.5 85.9  PLT 218 219 216 265 239   Cardiac Enzymes: No  results for input(s): CKTOTAL, CKMB, CKMBINDEX, TROPONINI in the last 168 hours. BNP (last 3 results) No results for input(s): PROBNP in the last 8760 hours. CBG: Recent Labs  Lab 05/15/20 1105 05/15/20 1646 05/15/20 2157 05/16/20 0612 05/16/20 1115  GLUCAP 233* 210* 230*  269* 253*   D-Dimer: No results for input(s): DDIMER in the last 72 hours. Hgb A1c: No results for input(s): HGBA1C in the last 72 hours. Lipid Profile: No results for input(s): CHOL, HDL, LDLCALC, TRIG, CHOLHDL, LDLDIRECT in the last 72 hours. Thyroid function studies: Recent Labs    05/14/20 0445  TSH 4.000   Anemia work up: No results for input(s): VITAMINB12, FOLATE, FERRITIN, TIBC, IRON, RETICCTPCT in the last 72 hours. Sepsis Labs: Recent Labs  Lab 05/10/20 2359 05/12/20 0752 05/13/20 0708 05/14/20 0445  WBC 15.0* 9.6 9.2 8.1    Microbiology Recent Results (from the past 240 hour(s))  SARS Coronavirus 2 by RT PCR (hospital order, performed in [facility] lab) Nasopharyngeal Nasopharyngeal Swab     Status: None   Collection Time: 05/11/20 12:49 AM   Specimen: Nasopharyngeal Swab  Result Value Ref Range Status   SARS Coronavirus 2 NEGATIVE NEGATIVE Final    Comment: (NOTE) SARS-CoV-2 target nucleic acids are NOT DETECTED. The SARS-CoV-2 RNA is generally detectable in upper and lower respiratory specimens during the acute phase of infection. The lowest concentration of SARS-CoV-2 viral copies this assay can detect is 250 copies / mL. A negative result does not preclude SARS-CoV-2 infection and should not be used as the sole basis for treatment or other patient management decisions.  A negative result may occur with improper specimen collection / handling, submission of specimen other than nasopharyngeal swab, presence of viral mutation(s) within the areas targeted by this assay, and inadequate number of viral copies (<250 copies / mL). A negative result must be combined with  clinical observations, patient history, and epidemiological information. Fact Sheet for Patients:   BoilerBrush.com.cy Fact Sheet for Healthcare Providers: https://pope.com/ This test is not yet approved or cleared  by the [place] FDA and has been authorized for detection and/or diagnosis of SARS-CoV-2 by FDA under an Emergency Use Authorization (EUA).  This EUA will remain in effect (meaning this test can be used) for the duration of the COVID-19 declaration under Section 564(b)(1) of the Act, 21 U.S.C. section 360bbb-3(b)(1), unless the authorization is terminated or revoked sooner. Performed at [facility], [address], [place], [place] [address]   Urine culture     Status: Abnormal   Collection Time: 05/11/20  2:20 AM   Specimen: Urine, Random  Result Value Ref Range Status   Specimen Description URINE, RANDOM  Final   Special Requests   Final    NONE Performed at [facility], [address] [place], [place] [address]    Culture MULTIPLE SPECIES PRESENT, SUGGEST RECOLLECTION (A)  Final   Report Status 05/11/2020 FINAL  Final    Procedures and diagnostic studies:  No results found.  Medications:   . aspirin EC  81 mg Oral Daily  . atorvastatin  40 mg Oral Daily  . citalopram  20 mg Oral Daily  . ezetimibe  10 mg Oral Daily  . gabapentin  300 mg Oral TID  . insulin aspart  0-15 Units Subcutaneous TID WC  . insulin aspart  0-5 Units Subcutaneous QHS  . insulin aspart protamine- aspart  15 Units Subcutaneous BID WC  . [START ON 05/17/2020] magnesium sulfate  40 mEq Other To OR  . metoprolol tartrate  12.5 mg Oral BID  . pantoprazole  40 mg Oral Daily  . [START ON 05/17/2020] potassium chloride  80 mEq Other To OR  . sodium chloride flush  3 mL Intravenous Q12H  Continuous Infusions: . [START ON 05/17/2020] cefUROXime (ZINACEF)  IV    . [START ON 05/17/2020] dexmedetomidine    . [START ON 05/17/2020]  heparin 30,000 units/NS 1000 mL solution for CELLSAVER    . [START ON 05/17/2020] norepinephrine (LEVOPHED) Adult infusion    . [START ON 05/17/2020] vancomycin       LOS: 5 days   Joseph Art  [facility]   How to contact the [facility] Attending or Consulting provider 7A - 7P or covering provider during after hours 7P -7A, for this patient?  1. Check the care team in [facility] and look for a) attending/consulting [facility] provider listed and b) the [facility] team listed 2. Log into [email or web address] and use 's universal password to access. If you do not have the password, please contact the hospital operator. 3. Locate the [facility] provider you are looking for under [facility] and page to a number that you can be directly reached. 4. If you still have difficulty reaching the provider, please page the [facility] (Director on Call) for the Hospitalists listed on amion for assistance.  05/16/2020, 11:47 AM

## 2020-05-17 ENCOUNTER — Inpatient Hospital Stay (HOSPITAL_COMMUNITY): Payer: Medicare Other | Admitting: Anesthesiology

## 2020-05-17 ENCOUNTER — Ambulatory Visit: Payer: Medicare Other | Admitting: Physical Therapy

## 2020-05-17 ENCOUNTER — Encounter (HOSPITAL_COMMUNITY)
Admission: EM | Disposition: A | Discharge status: Home Health | Payer: Self-pay | Source: Home / Self Care | Attending: Internal Medicine

## 2020-05-17 ENCOUNTER — Ambulatory Visit (HOSPITAL_COMMUNITY): Payer: Medicare Other

## 2020-05-17 ENCOUNTER — Encounter (HOSPITAL_COMMUNITY): Payer: Medicare Other

## 2020-05-17 ENCOUNTER — Encounter (HOSPITAL_COMMUNITY): Payer: Self-pay | Admitting: Internal Medicine

## 2020-05-17 ENCOUNTER — Inpatient Hospital Stay (HOSPITAL_COMMUNITY): Payer: Medicare Other

## 2020-05-17 DIAGNOSIS — I350 Nonrheumatic aortic (valve) stenosis: Secondary | ICD-10-CM

## 2020-05-17 DIAGNOSIS — Z006 Encounter for examination for normal comparison and control in clinical research program: Secondary | ICD-10-CM

## 2020-05-17 DIAGNOSIS — Z952 Presence of prosthetic heart valve: Secondary | ICD-10-CM

## 2020-05-17 HISTORY — PX: TRANSCATHETER AORTIC VALVE REPLACEMENT, TRANSFEMORAL: SHX6400

## 2020-05-17 HISTORY — DX: Presence of prosthetic heart valve: Z95.2

## 2020-05-17 HISTORY — PX: TEE WITHOUT CARDIOVERSION: SHX5443

## 2020-05-17 LAB — POCT I-STAT, CHEM 8
BUN: 20 mg/dL (ref 8–23)
BUN: 19 mg/dL (ref 8–23)
BUN: 19 mg/dL (ref 8–23)
Calcium, Ion: 1.26 mmol/L (ref 1.15–1.40)
Calcium, Ion: 1.3 mmol/L (ref 1.15–1.40)
Calcium, Ion: 1.3 mmol/L (ref 1.15–1.40)
Chloride: 100 mmol/L (ref 98–111)
Chloride: 99 mmol/L (ref 98–111)
Chloride: 100 mmol/L (ref 98–111)
Creatinine, Ser: 0.6 mg/dL (ref 0.44–1.00)
Creatinine, Ser: 0.5 mg/dL (ref 0.44–1.00)
Creatinine, Ser: 0.6 mg/dL (ref 0.44–1.00)
Glucose, Bld: 151 mg/dL — ABNORMAL HIGH (ref 70–99)
Glucose, Bld: 173 mg/dL — ABNORMAL HIGH (ref 70–99)
Glucose, Bld: 188 mg/dL — ABNORMAL HIGH (ref 70–99)
HCT: 40 % (ref 36.0–46.0)
HCT: 38 % (ref 36.0–46.0)
HCT: 36 % (ref 36.0–46.0)
Hemoglobin: 13.6 g/dL (ref 12.0–15.0)
Hemoglobin: 12.9 g/dL (ref 12.0–15.0)
Hemoglobin: 12.2 g/dL (ref 12.0–15.0)
Potassium: 4.5 mmol/L (ref 3.5–5.1)
Potassium: 4.5 mmol/L (ref 3.5–5.1)
Potassium: 4.8 mmol/L (ref 3.5–5.1)
Sodium: 138 mmol/L (ref 135–145)
Sodium: 138 mmol/L (ref 135–145)
Sodium: 138 mmol/L (ref 135–145)
TCO2: 28 mmol/L (ref 22–32)
TCO2: 28 mmol/L (ref 22–32)
TCO2: 27 mmol/L (ref 22–32)

## 2020-05-17 LAB — POCT ACTIVATED CLOTTING TIME
Activated Clotting Time: 142 seconds
Activated Clotting Time: 268 seconds
Activated Clotting Time: 127 seconds

## 2020-05-17 LAB — BASIC METABOLIC PANEL
Anion gap: 6 (ref 5–15)
BUN: 19 mg/dL (ref 8–23)
CO2: 29 mmol/L (ref 22–32)
Calcium: 9.1 mg/dL (ref 8.9–10.3)
Chloride: 103 mmol/L (ref 98–111)
Creatinine, Ser: 0.81 mg/dL (ref 0.44–1.00)
GFR calc Af Amer: >60 mL/min (ref >60)
GFR calc non Af Amer: >60 mL/min (ref >60)
Glucose, Bld: 198 mg/dL — ABNORMAL HIGH (ref 70–99)
Potassium: 4.5 mmol/L (ref 3.5–5.1)
Sodium: 138 mmol/L (ref 135–145)

## 2020-05-17 LAB — CBC
HCT: 43 % (ref 36.0–46.0)
Hemoglobin: 13.7 g/dL (ref 12.0–15.0)
MCH: 27.2 pg (ref 26.0–34.0)
MCHC: 31.9 g/dL (ref 30.0–36.0)
MCV: 85.3 fL (ref 80.0–100.0)
Platelets: 271 10*3/uL (ref 150–400)
RBC: 5.04 MIL/uL (ref 3.87–5.11)
RDW: 13.3 % (ref 11.5–15.5)
WBC: 9.5 10*3/uL (ref 4.0–10.5)
nRBC: 0 % (ref 0.0–0.2)

## 2020-05-17 LAB — GLUCOSE, CAPILLARY
Glucose-Capillary: 187 mg/dL — ABNORMAL HIGH (ref 70–99)
Glucose-Capillary: 173 mg/dL — ABNORMAL HIGH (ref 70–99)
Glucose-Capillary: 199 mg/dL — ABNORMAL HIGH (ref 70–99)

## 2020-05-17 LAB — URINALYSIS, ROUTINE W REFLEX MICROSCOPIC
Bacteria, UA: NONE SEEN
Bilirubin Urine: NEGATIVE
Glucose, UA: 150 mg/dL — AB
Ketones, ur: NEGATIVE mg/dL
Leukocytes,Ua: NEGATIVE
Nitrite: NEGATIVE
Protein, ur: NEGATIVE mg/dL
Specific Gravity, Urine: 1.011 (ref 1.005–1.030)
pH: 6 (ref 5.0–8.0)

## 2020-05-17 LAB — ECHOCARDIOGRAM LIMITED
Height: 64 in
Weight: 2857.16 oz

## 2020-05-17 SURGERY — IMPLANTATION, AORTIC VALVE, TRANSCATHETER, FEMORAL APPROACH
Anesthesia: Monitor Anesthesia Care | Site: Chest

## 2020-05-17 MED ORDER — LACTATED RINGERS IV SOLN: INTRAVENOUS | Status: DC | PRN

## 2020-05-17 MED ORDER — HEPARIN (PORCINE) IN NACL 1000-0.9 UT/500ML-% IV SOLN
INTRAVENOUS | Status: AC
  Filled 2020-05-17: qty 1500

## 2020-05-17 MED ORDER — MIDAZOLAM HCL 5 MG/5ML IJ SOLN
INTRAMUSCULAR | Status: DC | PRN
  Administered 2020-05-17: 1 mg via INTRAVENOUS

## 2020-05-17 MED ORDER — LIDOCAINE HCL (PF) 1 % IJ SOLN
INTRAMUSCULAR | Status: DC | PRN
  Administered 2020-05-17: 20 mL

## 2020-05-17 MED ORDER — HEPARIN SODIUM (PORCINE) 1000 UNIT/ML IJ SOLN
INTRAMUSCULAR | Status: DC | PRN
Start: 2020-05-17 — End: 2020-05-17
  Administered 2020-05-17: 1200 [IU] via INTRAVENOUS

## 2020-05-17 MED ORDER — SODIUM CHLORIDE 0.9 % IV SOLN: 250.0000 mL | INTRAVENOUS | Status: DC | PRN

## 2020-05-17 MED ORDER — PHENYLEPHRINE HCL-NACL 20-0.9 MG/250ML-% IV SOLN
0.0000 ug/min | INTRAVENOUS | Status: DC
  Filled 2020-05-17: qty 250

## 2020-05-17 MED ORDER — SODIUM CHLORIDE 0.9% FLUSH: 3.0000 mL | INTRAVENOUS | Status: DC | PRN

## 2020-05-17 MED ORDER — PHENYLEPHRINE HCL-NACL 10-0.9 MG/250ML-% IV SOLN
INTRAVENOUS | Status: DC | PRN
  Administered 2020-05-17: 15 ug/min via INTRAVENOUS

## 2020-05-17 MED ORDER — HEPARIN (PORCINE) IN NACL 1000-0.9 UT/500ML-% IV SOLN
INTRAVENOUS | Status: DC | PRN
  Administered 2020-05-17 (×3): 500 mL via SURGICAL_CAVITY

## 2020-05-17 MED ORDER — LIDOCAINE HCL (PF) 1 % IJ SOLN
INTRAMUSCULAR | Status: AC
  Filled 2020-05-17: qty 30

## 2020-05-17 MED ORDER — SODIUM CHLORIDE 0.9 % IV SOLN
1.5000 g | Freq: Two times a day (BID) | INTRAVENOUS | Status: AC
  Administered 2020-05-17 – 2020-05-19 (×4): 1.5 g via INTRAVENOUS
  Filled 2020-05-17 (×4): qty 1.5

## 2020-05-17 MED ORDER — ONDANSETRON HCL 4 MG/2ML IJ SOLN: 4.0000 mg | Freq: Four times a day (QID) | INTRAMUSCULAR | Status: DC | PRN

## 2020-05-17 MED ORDER — NITROGLYCERIN IN D5W 200-5 MCG/ML-% IV SOLN: 0.0000 ug/min | INTRAVENOUS | Status: DC

## 2020-05-17 MED ORDER — CLOPIDOGREL BISULFATE 75 MG PO TABS
75.0000 mg | ORAL_TABLET | Freq: Every day | ORAL | Status: DC
  Administered 2020-05-18 – 2020-05-19 (×2): 75 mg via ORAL
  Filled 2020-05-17 (×2): qty 1

## 2020-05-17 MED ORDER — IOHEXOL 350 MG/ML SOLN
INTRAVENOUS | Status: DC | PRN
  Administered 2020-05-17: 40 mL

## 2020-05-17 MED ORDER — VANCOMYCIN HCL IN DEXTROSE 1-5 GM/200ML-% IV SOLN
1000.0000 mg | Freq: Once | INTRAVENOUS | Status: AC
  Administered 2020-05-18: 1000 mg via INTRAVENOUS
  Filled 2020-05-17: qty 200

## 2020-05-17 MED ORDER — ACETAMINOPHEN 500 MG PO TABS
1000.0000 mg | ORAL_TABLET | Freq: Once | ORAL | Status: AC
  Administered 2020-05-17: 1000 mg via ORAL
  Filled 2020-05-17: qty 2

## 2020-05-17 MED ORDER — PROPOFOL 500 MG/50ML IV EMUL
INTRAVENOUS | Status: DC | PRN
  Administered 2020-05-17: 25 ug/kg/min via INTRAVENOUS

## 2020-05-17 MED ORDER — PROTAMINE SULFATE 10 MG/ML IV SOLN
INTRAVENOUS | Status: DC | PRN
Start: 2020-05-17 — End: 2020-05-17
  Administered 2020-05-17: 110 mg via INTRAVENOUS
  Administered 2020-05-17: 10 mg via INTRAVENOUS

## 2020-05-17 MED ORDER — LACTATED RINGERS IV SOLN: INTRAVENOUS | Status: DC

## 2020-05-17 MED ORDER — SODIUM CHLORIDE 0.9 % IV SOLN: INTRAVENOUS | Status: AC

## 2020-05-17 MED ORDER — METOPROLOL TARTRATE 5 MG/5ML IV SOLN: 2.5000 mg | INTRAVENOUS | Status: DC | PRN

## 2020-05-17 MED ORDER — SODIUM CHLORIDE 0.9% FLUSH
3.0000 mL | Freq: Two times a day (BID) | INTRAVENOUS | Status: DC
  Administered 2020-05-18 – 2020-05-19 (×4): 3 mL via INTRAVENOUS

## 2020-05-17 MED ORDER — IOHEXOL 350 MG/ML SOLN
INTRAVENOUS | Status: AC
  Filled 2020-05-17: qty 1

## 2020-05-17 SURGICAL SUPPLY — 32 items
BAG SNAP BAND KOVER 36X36 (MISCELLANEOUS) ×6 IMPLANT
BLANKET WARM UNDERBOD FULL ACC (MISCELLANEOUS) ×3 IMPLANT
CABLE ADAPT PACING TEMP 12FT (ADAPTER) ×2 IMPLANT
CATH 23 ULTRA DELIVERY (CATHETERS) ×1 IMPLANT
CATH DIAG 6FR PIGTAIL ANGLED (CATHETERS) ×2 IMPLANT
CATH INFINITI 6F AL1 (CATHETERS) ×1 IMPLANT
CATH S G BIP PACING (CATHETERS) ×1 IMPLANT
CLOSURE MYNX CONTROL 6F/7F (Vascular Products) ×1 IMPLANT
CRIMPER (MISCELLANEOUS) ×1 IMPLANT
DEVICE CLOSURE PERCLS PRGLD 6F (VASCULAR PRODUCTS) IMPLANT
DEVICE INFLATION ATRION QL2530 (MISCELLANEOUS) ×1 IMPLANT
GUIDEWIRE SAFE TJ AMPLATZ EXST (WIRE) ×1 IMPLANT
KIT HEART LEFT (KITS) ×3 IMPLANT
KIT MICROPUNCTURE NIT STIFF (SHEATH) ×1 IMPLANT
PACK CARDIAC CATHETERIZATION (CUSTOM PROCEDURE TRAY) ×3 IMPLANT
PERCLOSE PROGLIDE 6F (VASCULAR PRODUCTS) ×6
SHEATH 14X36 EDWARDS (SHEATH) ×1 IMPLANT
SHEATH BRITE TIP 7FR 35CM (SHEATH) ×1 IMPLANT
SHEATH PINNACLE 6F 10CM (SHEATH) ×1 IMPLANT
SHEATH PINNACLE 8F 10CM (SHEATH) ×1 IMPLANT
SHEATH PROBE COVER 6X72 (BAG) ×1 IMPLANT
SHIELD RADPAD SCOOP 12X17 (MISCELLANEOUS) ×1 IMPLANT
SLEEVE REPOSITIONING LENGTH 30 (MISCELLANEOUS) ×1 IMPLANT
STOPCOCK MORSE 400PSI 3WAY (MISCELLANEOUS) ×6 IMPLANT
SYR MEDRAD MARK V 150ML (SYRINGE) ×1 IMPLANT
TRANSDUCER W/STOPCOCK (MISCELLANEOUS) ×6 IMPLANT
TUBE CONN 8.8X1320 FR HP M-F (CONNECTOR) ×1 IMPLANT
VALVE 23 ULTRA SAPIEN KIT (Valve) ×1 IMPLANT
WIRE AMPLATZ SS-J .035X180CM (WIRE) ×1 IMPLANT
WIRE EMERALD 3MM-J .035X150CM (WIRE) ×1 IMPLANT
WIRE EMERALD 3MM-J .035X260CM (WIRE) ×1 IMPLANT
WIRE EMERALD ST .035X260CM (WIRE) ×1 IMPLANT

## 2020-05-17 NOTE — Progress Notes (Signed)
Left radial arterial line d/c'ed, manual pressure held x 10 minutes. Level 0. 2+ palpable left radial, gauze and tegaderm dressing, left hand and fingers warm and pink.

## 2020-05-17 NOTE — Progress Notes (Signed)
PT Cancellation Note  Patient Details Name: [name] MRN: [id number] DOB: [birth year]   Cancelled Treatment:    Reason Eval/Treat Not Completed: Patient at procedure or test/unavailable (TAVR). PT will continue to f/u with pt acutely as available.    Alessandra Bevels Dashanna Kinnamon 05/17/2020, 1:31 PM

## 2020-05-17 NOTE — Anesthesia Preprocedure Evaluation (Addendum)
Anesthesia Evaluation  Patient identified by MRN, date of birth, ID band Patient awake    Reviewed: Allergy & Precautions, H&P , NPO status , Patient's Chart, lab work & pertinent test results, reviewed documented beta blocker date and time   Airway Mallampati: II  TM Distance: >3 FB Neck ROM: Full    Dental no notable dental hx. (+) Edentulous Upper, Partial Lower, Dental Advisory Given   Pulmonary neg pulmonary ROS,    Pulmonary exam normal breath sounds clear to auscultation       Cardiovascular hypertension, Pt. on medications and Pt. on home beta blockers + Valvular Problems/Murmurs AS  Rhythm:Regular Rate:Normal + Systolic murmurs    Neuro/Psych Depression negative neurological ROS     GI/Hepatic Neg liver ROS, GERD  Medicated,  Endo/Other  diabetes, Insulin Dependent, Oral Hypoglycemic Agents  Renal/GU negative Renal ROS  negative genitourinary   Musculoskeletal   Abdominal   Peds  Hematology negative hematology ROS (+)   Anesthesia Other Findings   Reproductive/Obstetrics negative OB ROS                            Anesthesia Physical Anesthesia Plan  ASA: IV  Anesthesia Plan: MAC   Post-op Pain Management:    Induction: Intravenous  PONV Risk Score and Plan: 2 and Propofol infusion and Ondansetron  Airway Management Planned: Simple Face Mask  Additional Equipment: Arterial line  Intra-op Plan:   Post-operative Plan:   Informed Consent: I have reviewed the patients History and Physical, chart, labs and discussed the procedure including the risks, benefits and alternatives for the proposed anesthesia with the patient or authorized representative who has indicated his/her understanding and acceptance.     Dental advisory given  Plan Discussed with: CRNA  Anesthesia Plan Comments:         Anesthesia Quick Evaluation

## 2020-05-17 NOTE — Progress Notes (Signed)
PROGRESS NOTE    [name]  ANV:[id number] DOB: 12/03/1946 DOA: 05/10/2020 PCP: Myrlene Broker, MD   Brief Narrative:  [name] is an 74 y.o. female with medical history significant ofhypertension, hyperlipidemia, CAD, severe aortic stenosis, diabetes mellitus type 2,andchronic back painwho presents after having a fall with loss of consciousness. She had just been recently hospitalized from 5/28-6/2 with complaints of chest pain with NSTEMI. She underwent cardiac cath which showed nonobstructive disease. Since leaving the hospital patient reports that she has been constantly dizzy. She is woken up on the floor at least 3-4 times. Denies having any chest pain or shortness of breath. She had been nauseated 3 to 4 days prior to coming into the hospital initially on 5/28, but reports that those symptoms have now resolved.   Assessment & Plan:   Principal Problem:   Syncope Active Problems:   Type II diabetes mellitus with neurological manifestations, uncontrolled (HCC)   Hyperlipidemia associated with type 2 diabetes mellitus (HCC)   Essential hypertension   AKI (acute kidney injury) (HCC)   Aortic stenosis   Pressure injury of skin   Orthostatic hypotension   Dental caries   Syncope secondary to orthostatic hypotension: Acute. Patient presented after having multiple syncopal episodes at home. CTA of the chest did not note any signs of a pulmonary embolus. Patient denies any complaints of chest pain. Orthostatic vital signs noted to be positive.  Now feeling much better.  No more dizziness.  History of aortic stenosis:-per cards: TAVR to be done 6/9.   Urinary tract infection: Acute.She reports having dysuria. UA positive for signs of infection.  -Urine culture: multiple species -Rocephin IV x3 days completed  Acute kidney injury:  -resolved  Aortic stenosis: Cardiology would like to have CT angiogram studies of the heart for better  characterization of the patient's aortic stenosis. As well as of the chest abdomen and pelvis.  -per cards, plan for TAVR on 6/  Essential hypertension: Blood pressure very well controlled despite of holding her antihypertensives which are lisinopril and metoprolol.  Per cardiology.  Dental caries: Patient noted to have multiple missing teeth with dental caries involving 3 of the residual segment mandibular teeth. Patient need to follow up with dentist at discharge   Diabetes mellitus type 2: On admission blood sugar elevated to 261. Home medications include Metformin 500 mg twice daily and Humalog75/25 mix 45 units twicedaily.  She is on 15 units of 75/25 and her blood sugars very labile.  Now that she is n.p.o. and going to have surgery so I would adjust her insulin postoperatively.  Continue SSI in the meantime.  Dyslipidemia: Home medications include Zetia 10 mg daily and atorvastatin 40 mg daily. -Continue current home regimen  GERD: Home medications include Nexium 40 mg daily. -Continue pharmacy substitution of Protonix  obesity Body mass index is 30.65 kg/m.  DVT prophylaxis: Lovenox   Code Status: Full Code  Family Communication:  None present at bedside.  Plan of care discussed with patient in length and he verbalized understanding and agreed with it. Patient is from: Home Disposition Plan: Home when cleared by cardiothoracic surgery Barriers to discharge: TAVR planned today  Status is: Inpatient  Remains inpatient appropriate because:Ongoing diagnostic testing needed not appropriate for outpatient work up   Dispo: The patient is from: Home              Anticipated d/c is to: Home  Anticipated d/c date is: 2 days              Patient currently is not medically stable to d/c.        Estimated body mass index is 30.65 kg/m as calculated from the following:   Height as of this encounter: 5\' 4"  (1.626 m).   Weight as of this encounter: 81  kg.  Pressure Injury 05/11/20 Coccyx Medial Stage 2 -  Partial thickness loss of dermis presenting as a shallow open injury with a red, pink wound bed without slough. Shallow open area between buttocks (Active)  05/11/20 1500  Location: Coccyx  Location Orientation: Medial  Staging: Stage 2 -  Partial thickness loss of dermis presenting as a shallow open injury with a red, pink wound bed without slough.  Wound Description (Comments): Shallow open area between buttocks  Present on Admission: Yes     Nutritional status:               Consultants:   Cardiology and cardiothoracic surgery  Procedures:   TAVR planned today  Antimicrobials:  Anti-infectives (From admission, onward)   Start     Dose/Rate Route Frequency Ordered Stop   05/17/20 0400  vancomycin (VANCOREADY) IVPB 1250 mg/250 mL     1,250 mg 166.7 mL/hr over 90 Minutes Intravenous To Surgery 05/16/20 0745 05/18/20 0400   05/17/20 0400  cefUROXime (ZINACEF) 1.5 g in sodium chloride 0.9 % 100 mL IVPB     1.5 g 200 mL/hr over 30 Minutes Intravenous To Surgery 05/16/20 0745 05/18/20 0400   05/11/20 1115  cefTRIAXone (ROCEPHIN) 1 g in sodium chloride 0.9 % 100 mL IVPB     1 g 200 mL/hr over 30 Minutes Intravenous Every 24 hours 05/11/20 1104 05/13/20 1217         Subjective: Patient seen and examined.  She feels much better.  No complaints.  Denied any dizziness or chest pain.  Objective: Vitals:   05/16/20 1252 05/16/20 1814 05/16/20 2124 05/17/20 0555  BP: (!) 111/58 112/65 131/69 120/63  Pulse: 63 65 73 67  Resp: 17 17 16 16   Temp: 98.5 F (36.9 C) 99.4 F (37.4 C) 98.8 F (37.1 C) 97.9 F (36.6 C)  TempSrc: Oral Oral Oral Oral  SpO2: 95% 97% 92% 90%  Weight:      Height:        Intake/Output Summary (Last 24 hours) at 05/17/2020 1046 Last data filed at 05/17/2020 0300 Gross per 24 hour  Intake 600 ml  Output 300 ml  Net 300 ml   Filed Weights   05/14/20 0437 05/15/20 0500 05/16/20 0531   Weight: 82.9 kg 82 kg 81 kg    Examination:  General exam: Appears calm and comfortable  Respiratory system: Clear to auscultation. Respiratory effort normal. Cardiovascular system: S1 & S2 heard, RRR. No JV, rubs, gallops or clicks.  3/6 systolic murmur, +1 pitting edema bilaterally. Gastrointestinal system: Abdomen is nondistended, soft and nontender. No organomegaly or masses felt. Normal bowel sounds heard. Central nervous system: Alert and oriented. No focal neurological deficits. Extremities: Symmetric 5 x 5 power. Skin: No rashes, lesions or ulcers Psychiatry: Judgement and insight appear normal. Mood & affect appropriate.    Data Reviewed: I have personally reviewed following labs and imaging studies  CBC: Recent Labs  Lab 05/10/20 2359 05/12/20 0752 05/13/20 0708 05/14/20 0445 05/17/20 0454  WBC 15.0* 9.6 9.2 8.1 9.5  NEUTROABS 11.1*  --   --   --   --  HGB 14.8 13.5 14.2 13.2 13.7  HCT 46.3* 42.4 44.9 41.9 43.0  MCV 84.8 85.0 86.5 85.9 85.3  PLT 219 216 265 239 271   Basic Metabolic Panel: Recent Labs  Lab 05/10/20 2359 05/12/20 0752 05/13/20 0708 05/14/20 0445 05/17/20 0454  NA 134* 136 138 139 138  K 4.5 4.2 4.3 4.8 4.5  CL 95* 99 98 101 103  CO2 29 27 30 29 29   GLUCOSE 261* 142* 95 111* 198*  BUN 33* 17 18 16 19   CREATININE 1.14* 0.70 0.70 0.73 0.81  CALCIUM 8.9 9.1 9.4 8.9 9.1   GFR: Estimated Creatinine Clearance: 63.7 mL/min (by C-G formula based on SCr of 0.81 mg/dL). Liver Function Tests: Recent Labs  Lab 05/10/20 2359  AST 44*  ALT 46*  ALKPHOS 179*  BILITOT 0.5  PROT 7.4  ALBUMIN 3.2*   No results for input(s): LIPASE, AMYLASE in the last 168 hours. No results for input(s): AMMONIA in the last 168 hours. Coagulation Profile: Recent Labs  Lab 05/16/20 1819  INR 1.0   Cardiac Enzymes: No results for input(s): CKTOTAL, CKMB, CKMBINDEX, TROPONINI in the last 168 hours. BNP (last 3 results) No results for input(s): PROBNP in  the last 8760 hours. HbA1C: No results for input(s): HGBA1C in the last 72 hours. CBG: Recent Labs  Lab 05/16/20 0612 05/16/20 1115 05/16/20 1608 05/16/20 2124 05/17/20 0552  GLUCAP 269* 253* 191* 306* 187*   Lipid Profile: No results for input(s): CHOL, HDL, LDLCALC, TRIG, CHOLHDL, LDLDIRECT in the last 72 hours. Thyroid Function Tests: No results for input(s): TSH, T4TOTAL, FREET4, T3FREE, THYROIDAB in the last 72 hours. Anemia Panel: No results for input(s): VITAMINB12, FOLATE, FERRITIN, TIBC, IRON, RETICCTPCT in the last 72 hours. Sepsis Labs: No results for input(s): PROCALCITON, LATICACIDVEN in the last 168 hours.  Recent Results (from the past 240 hour(s))  SARS Coronavirus 2 by RT PCR (hospital order, performed in [facility] hospital lab) Nasopharyngeal Nasopharyngeal Swab     Status: None   Collection Time: 05/11/20 12:49 AM   Specimen: Nasopharyngeal Swab  Result Value Ref Range Status   SARS Coronavirus 2 NEGATIVE NEGATIVE Final    Comment: (NOTE) SARS-CoV-2 target nucleic acids are NOT DETECTED. The SARS-CoV-2 RNA is generally detectable in upper and lower respiratory specimens during the acute phase of infection. The lowest concentration of SARS-CoV-2 viral copies this assay can detect is 250 copies / mL. A negative result does not preclude SARS-CoV-2 infection and should not be used as the sole basis for treatment or other patient management decisions.  A negative result may occur with improper specimen collection / handling, submission of specimen other than nasopharyngeal swab, presence of viral mutation(s) within the areas targeted by this assay, and inadequate number of viral copies (<250 copies / mL). A negative result must be combined with clinical observations, patient history, and epidemiological information. Fact Sheet for Patients:   [facility] Fact Sheet for Healthcare  Providers: 07/11/20 This test is not yet approved or cleared  by the [employer] FDA and has been authorized for detection and/or diagnosis of SARS-CoV-2 by FDA under an Emergency Use Authorization (EUA).  This EUA will remain in effect (meaning this test can be used) for the duration of the COVID-19 declaration under Section 564(b)(1) of the Act, 21 U.S.C. section 360bbb-3(b)(1), unless the authorization is terminated or revoked sooner. Performed at [facility], [address] [place], [address] [place]   Urine culture     Status: Abnormal  Collection Time: 05/11/20  2:20 AM   Specimen: Urine, Random  Result Value Ref Range Status   Specimen Description URINE, RANDOM  Final   Special Requests   Final    NONE Performed at [facility], [address], [place], [place] [address]    Culture MULTIPLE SPECIES PRESENT, SUGGEST RECOLLECTION (A)  Final   Report Status 05/11/2020 FINAL  Final      Radiology Studies: DG Chest 2 View  Result Date: 05/16/2020 CLINICAL DATA:  Preoperative examination in patient for aortic valve replacement. EXAM: CHEST - 2 VIEW COMPARISON:  Single-view of the chest 05/10/2020. CT chest 05/11/2020. FINDINGS: Lungs clear. Heart size normal. No pneumothorax or pleural fluid. No acute or focal bony abnormality. IMPRESSION: No acute disease. Electronically Signed   By: Drusilla Kanner M.D.   On: 05/16/2020 18:53    Scheduled Meds: . acetaminophen  1,000 mg Oral Once  . aspirin EC  81 mg Oral Daily  . atorvastatin  40 mg Oral Daily  . chlorhexidine  1 application Topical Once  . chlorhexidine  15 mL Mouth/Throat Once  . citalopram  20 mg Oral Daily  . ezetimibe  10 mg Oral Daily  . gabapentin  300 mg Oral TID  . insulin aspart  0-15 Units Subcutaneous TID WC  . insulin aspart  0-5 Units Subcutaneous QHS  . insulin aspart protamine- aspart  15 Units Subcutaneous BID WC  . magnesium sulfate  40 mEq Other To OR   . pantoprazole  40 mg Oral Daily  . potassium chloride  80 mEq Other To OR  . sodium chloride flush  3 mL Intravenous Q12H   Continuous Infusions: . cefUROXime (ZINACEF)  IV    . dexmedetomidine    . heparin 30,000 units/NS 1000 mL solution for CELLSAVER    . norepinephrine (LEVOPHED) Adult infusion    . vancomycin       LOS: 6 days   Time spent: 35 minutes   Hughie Closs, MD [facility]  05/17/2020, 10:46 AM   To contact the attending provider between 7A-7P or the covering provider during after hours 7P-7A, please log into the web site [email or web address].

## 2020-05-17 NOTE — Anesthesia Procedure Notes (Signed)
Procedure Name: MAC Date/Time: 05/17/2020 2:20 PM Performed by: Oletta Lamas, CRNA Pre-anesthesia Checklist: Patient identified, Emergency Drugs available, Suction available, Patient being monitored and Timeout performed Patient Re-evaluated:Patient Re-evaluated prior to induction Oxygen Delivery Method: Simple face mask

## 2020-05-17 NOTE — Op Note (Signed)
[facility]   MULTIDISCIPLINARY HEART VALVE TEAM   TAVR OPERATIVE NOTE   Date of Procedure:  05/17/2020  Preoperative Diagnosis: Severe Aortic Stenosis   Postoperative Diagnosis: Same   Procedure:    Transcatheter Aortic Valve Replacement - Percutaneous Right Transfemoral Approach  Edwards Sapien 3 Ultra THV (size 23 mm, model # 9750TFX, serial # [id number])   Co-Surgeons:  Gaye Pollack, MD and Sherren Mocha, MD    Anesthesiologist:  Annye Asa, MD  Echocardiographer:  Viona Gilmore. O'Neal, MD  Pre-operative Echo Findings:  Severe aortic stenosis  Normal left ventricular systolic function  Post-operative Echo Findings:  No paravalvular leak  Normal left ventricular systolic function   BRIEF CLINICAL NOTE AND INDICATIONS FOR SURGERY  This 74 year old woman has stage D, severe, symptomatic aortic stenosis with New York Heart Association class II symptoms of exertional fatigue as well as dizziness and recurrent syncope and lower extremity edema consistent with chronic diastolic congestive heart failure.  I have personally reviewed her 2D echocardiogram, cardiac catheterization, and CTA studies.  Her echocardiogram shows a calcified aortic valve with a mean gradient of 32 mmHg and a measured valve area of 0.74 cm consistent with severe aortic stenosis.  Left ventricular ejection fraction 55 to 60%.  Cardiac catheterization showed nonobstructive coronary disease.  Calculated aortic valve area was 0.76 cm consistent with severe aortic LDL I agree that aortic valve placement is indicated in this patient with severe aortic stenosis and recurrent syncope.  I think transcatheter aortic valve replacement would be the best option for her.  Her gated cardiac CTA shows anatomy suitable for TAVR using a SAPIEN 3 valve.  Abdominal and pelvic CTA shows adequate pelvic vascular anatomy to allow transfemoral insertion.  The patient was counseled at length regarding treatment  alternatives for management of severe symptomatic aortic stenosis. The risks and benefits of surgical intervention has been discussed in detail. Long-term prognosis with medical therapy was discussed. Alternative approaches such as conventional surgical aortic valve replacement, transcatheter aortic valve replacement, and palliative medical therapy were compared and contrasted at length. This discussion was placed in the context of the patient's own specific clinical presentation and past medical history. All of her questions have been addressed.   Following the decision to proceed with transcatheter aortic valve replacement, a discussion was held regarding what types of management strategies would be attempted intraoperatively in the event of life-threatening complications, including whether or not the patient would be considered a candidate for the use of cardiopulmonary bypass and/or conversion to open sternotomy for attempted surgical intervention. The patient is aware of the fact that transient use of cardiopulmonary bypass may be necessary.  I think she would be a candidate for emergent sternotomy needed to manage any intraoperative complications.  The patient has been advised of a variety of complications that might develop including but not limited to risks of death, stroke, paravalvular leak, aortic dissection or other major vascular complications, aortic annulus rupture, device embolization, cardiac rupture or perforation, mitral regurgitation, acute myocardial infarction, arrhythmia, heart block or bradycardia requiring permanent pacemaker placement, congestive heart failure, respiratory failure, renal failure, pneumonia, infection, other late complications related to structural valve deterioration or migration, or other complications that might ultimately cause a temporary or permanent loss of functional independence or other long term morbidity. The patient provides full informed consent for the  procedure as described and all questions were answered.      DETAILS OF THE OPERATIVE PROCEDURE  PREPARATION:    The patient  was brought to the operating room on the above mentioned date and appropriate monitoring was established by the anesthesia team. The patient was placed in the supine position on the operating table.  Intravenous antibiotics were administered. The patient was monitored closely throughout the procedure under conscious sedation.  Baseline transthoracic echocardiogram was performed. The patient's abdomen and both groins were prepped and draped in a sterile manner. A time out procedure was performed.   PERIPHERAL ACCESS:    Using the modified Seldinger technique, femoral arterial and venous access was obtained with placement of 6 Fr sheaths on the left side.  A pigtail diagnostic catheter was passed through the left arterial sheath under fluoroscopic guidance into the aortic root.  A temporary transvenous pacemaker catheter was passed through the left femoral venous sheath under fluoroscopic guidance into the right ventricle.  The pacemaker was tested to ensure stable lead placement and pacemaker capture. Aortic root angiography was performed in order to determine the optimal angiographic angle for valve deployment.   TRANSFEMORAL ACCESS:   Percutaneous transfemoral access and sheath placement was performed using ultrasound guidance.  The right common femoral artery was cannulated using a micropuncture needle and appropriate location was verified using hand injection angiogram.  A pair of Abbott Perclose percutaneous closure devices were placed and a 6 French sheath replaced into the femoral artery.  The patient was heparinized systemically and ACT verified > 250 seconds.    A 14 Fr transfemoral E-sheath was introduced into the right common femoral artery after progressively dilating over an Amplatz superstiff wire. An AL-1 catheter was used to direct a straight-tip exchange  length wire across the native aortic valve into the left ventricle. This was exchanged out for a pigtail catheter and position was confirmed in the LV apex. Simultaneous LV and Ao pressures had been recently recorded in the OR and were not repeated.  The pigtail catheter was exchanged for an Amplatz Extra-stiff wire in the LV apex.     BALLOON AORTIC VALVULOPLASTY:   Not performed  TRANSCATHETER HEART VALVE DEPLOYMENT:   An Edwards Sapien 3 Ultra transcatheter heart valve (size 23 mm, model #[id number], serial #[id number]) was prepared and crimped per manufacturer's guidelines, and the proper orientation of the valve is confirmed on the [employer] delivery system. The valve was advanced through the introducer sheath using normal technique until in an appropriate position in the abdominal aorta beyond the sheath tip. The balloon was then retracted and using the fine-tuning wheel was centered on the valve. The valve was then advanced across the aortic arch using appropriate flexion of the catheter. The valve was carefully positioned across the aortic valve annulus. The Commander catheter was retracted using normal technique. Once final position of the valve has been confirmed by angiographic assessment, the valve is deployed while temporarily holding ventilation and during rapid ventricular pacing to maintain systolic blood pressure < 50 mmHg and pulse pressure < 10 mmHg. The balloon inflation is held for >3 seconds after reaching full deployment volume. Once the balloon has fully deflated the balloon is retracted into the ascending aorta and valve function is assessed using echocardiography. There is felt to be no paravalvular leak and no central aortic insufficiency.  The patient's hemodynamic recovery following valve deployment is good.  The deployment balloon and guidewire are both removed.    PROCEDURE COMPLETION:   The sheath was removed and femoral artery closure performed.  Protamine was  administered once femoral arterial repair was complete. The temporary pacemaker, pigtail catheters  and femoral sheaths were removed with manual pressure used for hemostasis.  A Mynx femoral closure device was utilized following removal of the diagnostic sheath in the left femoral artery.  The patient tolerated the procedure well and is transported to the cath lab recovery area in stable condition. There were no immediate intraoperative complications. All sponge instrument and needle counts are verified correct at completion of the operation.   No blood products were administered during the operation.  The patient received a total of 40 mL of intravenous contrast during the procedure.   Alleen Borne, MD 05/17/2020

## 2020-05-17 NOTE — Progress Notes (Signed)
Spoke with Katie on the TAVR team and asked her of she wanted me to give pt her AM medications and she stated to hold all the medications.

## 2020-05-17 NOTE — Discharge Instructions (Signed)
ACTIVITY AND EXERCISE °• Daily activity and exercise are an important part of your recovery. People recover at different rates depending on their general health and type of valve procedure. °• Most people recovering from TAVR feel better relatively quickly  °• No lifting, pushing, pulling more than 10 pounds (examples to avoid: groceries, vacuuming, gardening, golfing): °            - For one week with a procedure through the groin. °            - For six weeks for procedures through the chest wall or neck. °NOTE: You will typically see one of our providers 7-14 days after your procedure to discuss WHEN TO RESUME the above activities.  °  °  °DRIVING °• Do not drive until you are seen for follow up and cleared by a provider. Generally, we ask patient to not drive for 1 week after their procedure. °• If you have been told by your doctor in the past that you may not drive, you must talk with him/her before you begin driving again. °  °DRESSING °• Groin site: you may leave the clear dressing over the site for up to one week or until it falls off. °  °HYGIENE °• If you had a femoral (leg) procedure, you may take a shower when you return home. After the shower, pat the site dry. Do NOT use powder, oils or lotions in your groin area until the site has completely healed. °• If you had a chest procedure, you may shower when you return home unless specifically instructed not to by your discharging practitioner. °            - DO NOT scrub incision; pat dry with a towel. °            - DO NOT apply any lotions, oils, powders to the incision. °            - No tub baths / swimming for at least 2 weeks. °• If you notice any fevers, chills, increased pain, swelling, bleeding or pus, please contact your doctor. °  °ADDITIONAL INFORMATION °• If you are going to have an upcoming dental procedure, please contact our office as you will require antibiotics ahead of time to prevent infection on your heart valve.  ° ° °If you have any  questions or concerns you can call the structural heart phone during normal business hours 8am-4pm. If you have an urgent need after hours or weekends please call [phone] to talk to the on call provider for general cardiology. If you have an emergency that requires immediate attention, please call 911.  ° ° °After TAVR Checklist ° °Check  Test Description  ° Follow up appointment in 1-2 weeks  You will see our structural heart physician assistant, [name]. Your incision sites will be checked and you will be cleared to drive and resume all normal activities if you are doing well.    ° 1 month echo and follow up  You will have an echo to check on your new heart valve and be seen back in the office by [name]. Many times the echo is not read by your appointment time, but Cynthia will call you later that day or the following day to report your results.  ° Follow up with your primary cardiologist You will need to be seen by your primary cardiologist in the following 3-6 months after your 1 month appointment in the valve   clinic. Often times your Plavix or Aspirin will be discontinued during this time, but this is decided on a case by case basis.   ° 1 year echo and follow up You will have another echo to check on your heart valve after 1 year and be seen back in the office by [name]. This your last structural heart visit.  ° Bacterial endocarditis prophylaxis  You will have to take antibiotics for the rest of your life before all dental procedures (even teeth cleanings) to protect your heart valve. Antibiotics are also required before some surgeries. Please check with your cardiologist before scheduling any surgeries. Also, please make sure to tell us if you have a penicillin allergy as you will require an alternative antibiotic.   ° ° °

## 2020-05-17 NOTE — Transfer of Care (Signed)
Immediate Anesthesia Transfer of Care Note  Patient: [name]  Procedure(s) Performed: TRANSCATHETER AORTIC VALVE REPLACEMENT, TRANSFEMORAL (N/A Chest) TRANSESOPHAGEAL ECHOCARDIOGRAM (TEE) (N/A )  Patient Location: PACU  Anesthesia Type:MAC  Level of Consciousness: drowsy, patient cooperative and responds to stimulation  Airway & Oxygen Therapy: Patient Spontanous Breathing and Patient connected to nasal cannula oxygen  Post-op Assessment: Report given to RN and Post -op Vital signs reviewed and stable  Post vital signs: Reviewed and stable  Last Vitals:  Vitals Value Taken Time  BP 98/48 05/17/20 1603  Temp 36.5 C 05/17/20 1557  Pulse 64 05/17/20 1603  Resp 19 05/17/20 1603  SpO2 95 % 05/17/20 1603  Vitals shown include unvalidated device data.  Last Pain:  Vitals:   05/17/20 1557  TempSrc: Temporal  PainSc: Asleep      Patients Stated Pain Goal: 0 (72/09/19 8022)  Complications: No apparent anesthesia complications

## 2020-05-17 NOTE — Op Note (Signed)
[facility]   MULTIDISCIPLINARY HEART VALVE TEAM   TAVR OPERATIVE NOTE   Date of Procedure:  05/17/2020  Preoperative Diagnosis: Severe Aortic Stenosis   Postoperative Diagnosis: Same   Procedure:    Transcatheter Aortic Valve Replacement - Percutaneous  Transfemoral Approach  Edwards Sapien 3 Ultra THV (size 23 mm, model # 9750TFX, serial # [id number])   Co-Surgeons:  Alleen Borne, MD and Tonny Bollman, MD  Anesthesiologist:  Jairo Ben, MD and Marguerita Merles, MD  Echocardiographer:  Van Clines, MD  Pre-operative Echo Findings:  Severe aortic stenosis  Normal left ventricular systolic function  Post-operative Echo Findings:  No paravalvular leak  Normal left ventricular systolic function  BRIEF CLINICAL NOTE AND INDICATIONS FOR SURGERY  74 yo woman with DM, HTN, and hyperlipidemia presents with dizziness and syncope in the setting of severe aortic stenosis. She also complains of NYHA functional class II symptoms of exertional dyspnea. She has undergone extensive evaluation with cath, echo, and CTA studies as well as formal cardiac surgical consultation. After multidisciplinary review of her case, we are in agreement that TAVR is appropriate for treatment of severe, stage D1, aortic stenosis.   During the course of the patient's preoperative work up they have been evaluated comprehensively by a multidisciplinary team of specialists coordinated through the Multidisciplinary Heart Valve Clinic in the [facility].  They have been demonstrated to suffer from symptomatic severe aortic stenosis as noted above. The patient has been counseled extensively as to the relative risks and benefits of all options for the treatment of severe aortic stenosis including long term medical therapy, conventional surgery for aortic valve replacement, and transcatheter aortic valve replacement.  The patient has been independently evaluated in formal  cardiac surgical consultation by Dr Laneta Simmers, who deemed the patient appropriate for TAVR. Based upon review of all of the patient's preoperative diagnostic tests they are felt to be candidate for transcatheter aortic valve replacement using the transfemoral approach as an alternative to conventional surgery.    Following the decision to proceed with transcatheter aortic valve replacement, a discussion has been held regarding what types of management strategies would be attempted intraoperatively in the event of life-threatening complications, including whether or not the patient would be considered a candidate for the use of cardiopulmonary bypass and/or conversion to open sternotomy for attempted surgical intervention.  The patient has been advised of a variety of complications that might develop peculiar to this approach including but not limited to risks of death, stroke, paravalvular leak, aortic dissection or other major vascular complications, aortic annulus rupture, device embolization, cardiac rupture or perforation, acute myocardial infarction, arrhythmia, heart block or bradycardia requiring permanent pacemaker placement, congestive heart failure, respiratory failure, renal failure, pneumonia, infection, other late complications related to structural valve deterioration or migration, or other complications that might ultimately cause a temporary or permanent loss of functional independence or other long term morbidity.  The patient provides full informed consent for the procedure as described and all questions were answered preoperatively.  DETAILS OF THE OPERATIVE PROCEDURE  PREPARATION:   The patient is brought to the operating room on the above mentioned date and central monitoring was established by the anesthesia team including placement of a central venous catheter and radial arterial line. The patient is placed in the supine position on the operating table.  Intravenous antibiotics are  administered. The patient is monitored closely throughout the procedure under conscious sedation.  Baseline transthoracic echocardiogram is performed. The patient's  chest, abdomen, both groins, and both lower extremities are prepared and draped in a sterile manner. A time out procedure is performed.   PERIPHERAL ACCESS:   Using ultrasound guidance, femoral arterial and venous access is obtained with placement of 6 Fr sheaths on the left side.  A pigtail diagnostic catheter was passed through the femoral arterial sheath under fluoroscopic guidance into the aortic root.  A temporary transvenous pacemaker catheter was passed through the femoral venous sheath under fluoroscopic guidance into the right ventricle.  The pacemaker was tested to ensure stable lead placement and pacemaker capture. Aortic root angiography was performed in order to determine the optimal angiographic angle for valve deployment.  TRANSFEMORAL ACCESS:  A micropuncture technique is used to access the right femoral artery under fluoroscopic and ultrasound guidance.  2 Perclose devices are deployed at 10' and 2' positions to 'PreClose' the femoral artery. An 8 French sheath is placed and then an Amplatz Superstiff wire is advanced through the sheath. This is changed out for a 14 French transfemoral E-Sheath after progressively dilating over the Superstiff wire.  An AL-1 catheter was used to direct a straight-tip exchange length wire across the native aortic valve into the left ventricle. This was exchanged out for a pigtail catheter and position was confirmed in the LV apex. Simultaneous LV and Ao pressures were recorded.  The pigtail catheter was exchanged for an Amplatz Extra-stiff wire in the LV apex.    BALLOON AORTIC VALVULOPLASTY:  Not performed  TRANSCATHETER HEART VALVE DEPLOYMENT:  An Edwards Sapien 3 transcatheter heart valve (size 23 mm) was prepared and crimped per manufacturer's guidelines, and the proper orientation of  the valve is confirmed on the [employer] delivery system. The valve was advanced through the introducer sheath using normal technique until in an appropriate position in the abdominal aorta beyond the sheath tip. The balloon was then retracted and using the fine-tuning wheel was centered on the valve. The valve was then advanced across the aortic arch using appropriate flexion of the catheter. The valve was carefully positioned across the aortic valve annulus. The Commander catheter was retracted using normal technique. Once final position of the valve has been confirmed by angiographic assessment, the valve is deployed while temporarily holding ventilation and during rapid ventricular pacing to maintain systolic blood pressure < 50 mmHg and pulse pressure < 10 mmHg. The balloon inflation is held for >3 seconds after reaching full deployment volume. Once the balloon has fully deflated the balloon is retracted into the ascending aorta and valve function is assessed using echocardiography. The patient's hemodynamic recovery following valve deployment is good.  The deployment balloon and guidewire are both removed. Echo demostrated acceptable post-procedural gradients, stable mitral valve function, and no aortic insufficiency.    PROCEDURE COMPLETION:  The sheath was removed and femoral artery closure is performed using the 2 previously deployed Perclose devices.  Protamine is administered once femoral arterial repair was complete. The site is clear with no evidence of bleeding or hematoma after the sutures are tightened. The temporary pacemaker and pigtail catheters are removed. Mynx closure is used for contralateral femoral arterial hemostasis for the 6 Fr sheath.  The patient tolerated the procedure well and is transported to the surgical intensive care in stable condition. There were no immediate intraoperative complications. All sponge instrument and needle counts are verified correct at completion of  the operation.   The patient received a total of 40 mL of intravenous contrast during the procedure.   Sherren Mocha,  MD 05/17/2020 3:29 PM

## 2020-05-17 NOTE — ED Provider Notes (Signed)
.  Critical Care Performed by: Sabas Sous, MD Authorized by: Sabas Sous, MD   Critical care provider statement:    Critical care time (minutes):  32   Critical care was necessary to treat or prevent imminent or life-threatening deterioration of the following conditions: NSTEMI.   Critical care was time spent personally by me on the following activities:  Discussions with consultants, evaluation of patient's response to treatment, examination of patient, ordering and performing treatments and interventions, ordering and review of laboratory studies, ordering and review of radiographic studies, pulse oximetry, re-evaluation of patient's condition, obtaining history from patient or surrogate and review of old charts      Sabas Sous, MD 05/17/20 1659

## 2020-05-17 NOTE — Anesthesia Postprocedure Evaluation (Signed)
Anesthesia Post Note  Patient: [name]  Procedure(s) Performed: TRANSCATHETER AORTIC VALVE REPLACEMENT, TRANSFEMORAL (N/A Chest) TRANSESOPHAGEAL ECHOCARDIOGRAM (TEE) (N/A )     Patient location during evaluation: Cath Lab Anesthesia Type: MAC Level of consciousness: awake and alert, oriented and patient cooperative Pain management: pain level controlled Vital Signs Assessment: post-procedure vital signs reviewed and stable Respiratory status: spontaneous breathing, nonlabored ventilation, respiratory function stable and patient connected to nasal cannula oxygen Cardiovascular status: blood pressure returned to baseline and stable Postop Assessment: no apparent nausea or vomiting Anesthetic complications: no    Last Vitals:  Vitals:   05/17/20 1610 05/17/20 1615  BP: (!) 103/48 (!) 102/47  Pulse: 62 62  Resp: 18 16  Temp:    SpO2: 95% 95%    Last Pain:  Vitals:   05/17/20 1557  TempSrc: Temporal  PainSc: Asleep                 Tytionna Cloyd,E. Yaslyn Cumby

## 2020-05-18 ENCOUNTER — Encounter (HOSPITAL_COMMUNITY): Payer: Self-pay | Admitting: Cardiovascular Disease

## 2020-05-18 ENCOUNTER — Encounter: Payer: Medicare Other | Admitting: Surgery

## 2020-05-18 ENCOUNTER — Inpatient Hospital Stay (HOSPITAL_COMMUNITY): Payer: Medicare Other

## 2020-05-18 DIAGNOSIS — Z952 Presence of prosthetic heart valve: Secondary | ICD-10-CM

## 2020-05-18 DIAGNOSIS — I350 Nonrheumatic aortic (valve) stenosis: Secondary | ICD-10-CM

## 2020-05-18 LAB — BASIC METABOLIC PANEL
Anion gap: 10 (ref 5–15)
BUN: 16 mg/dL (ref 8–23)
CO2: 22 mmol/L (ref 22–32)
Calcium: 8.4 mg/dL — ABNORMAL LOW (ref 8.9–10.3)
Chloride: 101 mmol/L (ref 98–111)
Creatinine, Ser: 0.65 mg/dL (ref 0.44–1.00)
GFR calc Af Amer: >60 mL/min (ref >60)
GFR calc non Af Amer: >60 mL/min (ref >60)
Glucose, Bld: 291 mg/dL — ABNORMAL HIGH (ref 70–99)
Potassium: 4 mmol/L (ref 3.5–5.1)
Sodium: 133 mmol/L — ABNORMAL LOW (ref 135–145)

## 2020-05-18 LAB — CBC
HCT: 42.5 % (ref 36.0–46.0)
Hemoglobin: 14 g/dL (ref 12.0–15.0)
MCH: 27.6 pg (ref 26.0–34.0)
MCHC: 32.9 g/dL (ref 30.0–36.0)
MCV: 83.7 fL (ref 80.0–100.0)
Platelets: 212 10*3/uL (ref 150–400)
RBC: 5.08 MIL/uL (ref 3.87–5.11)
RDW: 13.2 % (ref 11.5–15.5)
WBC: 12.1 10*3/uL — ABNORMAL HIGH (ref 4.0–10.5)
nRBC: 0 % (ref 0.0–0.2)

## 2020-05-18 LAB — GLUCOSE, CAPILLARY
Glucose-Capillary: 203 mg/dL — ABNORMAL HIGH (ref 70–99)
Glucose-Capillary: 217 mg/dL — ABNORMAL HIGH (ref 70–99)
Glucose-Capillary: 260 mg/dL — ABNORMAL HIGH (ref 70–99)
Glucose-Capillary: 259 mg/dL — ABNORMAL HIGH (ref 70–99)

## 2020-05-18 LAB — ECHOCARDIOGRAM COMPLETE
Height: 64 in
Weight: 2857.16 oz

## 2020-05-18 LAB — MAGNESIUM: Magnesium: 1.6 mg/dL — ABNORMAL LOW (ref 1.7–2.4)

## 2020-05-18 MED ORDER — INSULIN ASPART PROT & ASPART (70-30 MIX) 100 UNIT/ML ~~LOC~~ SUSP
25.0000 [IU] | Freq: Two times a day (BID) | SUBCUTANEOUS | Status: DC
  Administered 2020-05-18 – 2020-05-19 (×2): 25 [IU] via SUBCUTANEOUS
  Filled 2020-05-18: qty 10

## 2020-05-18 MED ORDER — PERFLUTREN LIPID MICROSPHERE
1.0000 mL | INTRAVENOUS | Status: AC | PRN
  Administered 2020-05-18: 2 mL via INTRAVENOUS
  Filled 2020-05-18: qty 10

## 2020-05-18 MED ORDER — FUROSEMIDE 20 MG PO TABS
20.0000 mg | ORAL_TABLET | Freq: Every day | ORAL | Status: DC
  Administered 2020-05-18 – 2020-05-19 (×2): 20 mg via ORAL
  Filled 2020-05-18 (×2): qty 1

## 2020-05-18 MED ORDER — MAGNESIUM SULFATE 2 GM/50ML IV SOLN
2.0000 g | Freq: Once | INTRAVENOUS | Status: AC
  Administered 2020-05-18: 2 g via INTRAVENOUS
  Filled 2020-05-18: qty 50

## 2020-05-18 NOTE — Progress Notes (Signed)
Received a message from cardiology that she is cleared for discharge.  She was also seen by PT who recommended SNF however patient adamant on going home with home health.  Called RN and was able to speak to patient over the phone about discharge plan however she requested to keep her in the hospital and discharge tomorrow as she will have her sister and more arrangements for her to go tomorrow.

## 2020-05-18 NOTE — TOC Progression Note (Addendum)
Transition of Care [facility]) - Progression Note    Patient Details  Name: [name] MRN: [id number] Date of Birth: Dec 04, 1946  Transition of Care [facility]) CM/SW [place], [place] Phone Number: 05/18/2020, 1:05 PM  Clinical Narrative:     CSW met with patient to confirm discharge plan. CSW introduced self and explained role. Patient confirmed plans to discharge home and not to SNF. Patient states her sister,Kathy is going to stay with her for one month and assist her with her care.   CSW contacted SNF informed patient if going home.  Thurmond Butts, MSW, [place] Clinical Social Worker     Expected Discharge Plan: Skilled Nursing Facility Barriers to Discharge: Ship broker, Continued Medical Work up  Expected Discharge Plan and Services Expected Discharge Plan: [place]       Living arrangements for the past 2 months: Single Family Home                                       Social Determinants of Health (SDOH) Interventions    Readmission Risk Interventions No flowsheet data found.

## 2020-05-18 NOTE — Plan of Care (Signed)
Continue to monitor

## 2020-05-18 NOTE — Progress Notes (Addendum)
[place] VALVE TEAM  Patient Name: [name] Date of Encounter: 05/18/2020  Primary Cardiologist: Dr. Oval Linsey / Dr. Burt Knack & Dr. Cyndia Bent (TAVR)  Hospital Problem List     Principal Problem:   Syncope Active Problems:   Type II diabetes mellitus with neurological manifestations, uncontrolled ([place])   Hyperlipidemia associated with type 2 diabetes mellitus ([place])   Essential hypertension   AKI (acute kidney injury) ([place])   Aortic stenosis   Pressure injury of skin   Orthostatic hypotension   Dental caries   Severe aortic stenosis   S/P TAVR (transcatheter aortic valve replacement)    Subjective   No complaints. Getting up to go to bathroom. Going home with sister in law who will stay with her a few days.   Inpatient Medications    Scheduled Meds: . aspirin EC  81 mg Oral Daily  . atorvastatin  40 mg Oral Daily  . citalopram  20 mg Oral Daily  . clopidogrel  75 mg Oral Q breakfast  . ezetimibe  10 mg Oral Daily  . gabapentin  300 mg Oral TID  . insulin aspart  0-15 Units Subcutaneous TID WC  . insulin aspart  0-5 Units Subcutaneous QHS  . insulin aspart protamine- aspart  15 Units Subcutaneous BID WC  . pantoprazole  40 mg Oral Daily  . sodium chloride flush  3 mL Intravenous Q12H  . sodium chloride flush  3 mL Intravenous Q12H   Continuous Infusions: . sodium chloride    . cefUROXime (ZINACEF)  IV 1.5 g (05/18/20 0646)  . lactated ringers 10 mL/hr at 05/17/20 1310  . magnesium sulfate bolus IVPB    . nitroGLYCERIN    . phenylephrine (NEO-SYNEPHRINE) Adult infusion     PRN Meds: sodium chloride, acetaminophen **OR** acetaminophen, alum & mag hydroxide-simeth, metoprolol tartrate, naphazoline-glycerin, ondansetron (ZOFRAN) IV, sodium chloride flush   Vital Signs    Vitals:   05/17/20 2300 05/18/20 0006 05/18/20 0446 05/18/20 0833  BP: (!) 151/63 124/61 112/73 (!) 144/60  Pulse: 79 90 81 86  Resp: (!) 21 16  19 16   Temp:  98.9 F (37.2 C) 98.6 F (37 C) 99.3 F (37.4 C)  TempSrc:  Oral Oral Oral  SpO2:  93% 96% 92%  Weight:      Height:        Intake/Output Summary (Last 24 hours) at 05/18/2020 0836 Last data filed at 05/18/2020 0834 Gross per 24 hour  Intake 100 ml  Output 1100 ml  Net -1000 ml   Filed Weights   05/15/20 0500 05/16/20 0531 05/17/20 1255  Weight: 82 kg 81 kg 81 kg    Physical Exam   GEN: Well nourished, well developed, in no acute distress. obese HEENT: Grossly normal.  Neck: Supple, no JVD, carotid bruits, or masses. No edema Respiratory:  Respirations regular and unlabored, clear to auscultation bilaterally. GI: Soft, nontender, nondistended, BS + x 4. MS: no deformity or atrophy. Skin: warm and dry, no rash.  Groin sites clear without hematoma or ecchymosis  Neuro:  Strength and sensation are intact. Psych: AAOx3.  Normal affect.  Labs    CBC Recent Labs    05/17/20 0454 05/17/20 1424 05/17/20 1611 05/18/20 0238  WBC 9.5  --   --  12.1*  HGB 13.7   < > 12.2 14.0  HCT 43.0   < > 36.0 42.5  MCV 85.3  --   --  83.7  PLT 271  --   --  212   < > = values in this interval not displayed.   Basic Metabolic Panel Recent Labs    57/01/77 0454 05/17/20 1424 05/17/20 1611 05/18/20 0238  NA 138   < > 138 133*  K 4.5   < > 4.8 4.0  CL 103   < > 100 101  CO2 29  --   --  22  GLUCOSE 198*   < > 188* 291*  BUN 19   < > 19 16  CREATININE 0.81   < > 0.60 0.65  CALCIUM 9.1  --   --  8.4*  MG  --   --   --  1.6*   < > = values in this interval not displayed.   Liver Function Tests No results for input(s): AST, ALT, ALKPHOS, BILITOT, PROT, ALBUMIN in the last 72 hours. No results for input(s): LIPASE, AMYLASE in the last 72 hours. Cardiac Enzymes No results for input(s): CKTOTAL, CKMB, CKMBINDEX, TROPONINI in the last 72 hours. BNP Invalid input(s): POCBNP D-Dimer No results for input(s): DDIMER in the last 72 hours. Hemoglobin A1C No results  for input(s): HGBA1C in the last 72 hours. Fasting Lipid Panel No results for input(s): CHOL, HDL, LDLCALC, TRIG, CHOLHDL, LDLDIRECT in the last 72 hours. Thyroid Function Tests No results for input(s): TSH, T4TOTAL, T3FREE, THYROIDAB in the last 72 hours.  Invalid input(s): FREET3  Telemetry    Short period of sinus bradycardia, otherwise NSR with PVCs - personally reviewed.   ECG    Sinus- no new tracing today, will order. - Personally Reviewed  Radiology    DG Chest 2 View  Result Date: 05/16/2020 CLINICAL DATA:  Preoperative examination in patient for aortic valve replacement. EXAM: CHEST - 2 VIEW COMPARISON:  Single-view of the chest 05/10/2020. CT chest 05/11/2020. FINDINGS: Lungs clear. Heart size normal. No pneumothorax or pleural fluid. No acute or focal bony abnormality. IMPRESSION: No acute disease. Electronically Signed   By: Drusilla Kanner M.D.   On: 05/16/2020 18:53   ECHOCARDIOGRAM LIMITED  Result Date: 05/17/2020    ECHOCARDIOGRAM LIMITED REPORT   Patient Name:   [name] Date of Exam: 05/17/2020 Medical Rec #:  [id number]             Height:       64.0 in Accession #:    [id number]            Weight:       178.6 lb Date of Birth:  06/14/1946             BSA:          1.864 m Patient Age:    74 years              BP:           120/63 mmHg Patient Gender: F                     HR:           66 bpm. Exam Location:  Inpatient Procedure: Limited Echo, Color Doppler and Cardiac Doppler Indications:    Aortic Stenosis  History:        Patient has prior history of Echocardiogram examinations, most                 recent 05/06/2020. Risk Factors:Hypertension, Dyslipidemia and                 Diabetes. GERD.  Aortic Valve: 23 mm Edwards Sapien prosthetic, stented (TAVR)                 valve is present in the aortic position. Procedure Date:                 05/17/2020.  Sonographer:    Leta Jungling RDCS Sonographer#2:  Leta Jungling RDCS Referring Phys:  [id number] Wille Celeste THOMPSON IMPRESSIONS  1. Echo guided TAVR procedure. Preprocedure Vmax 3.3 m/s, mean gradient 26 mmHG, AVA 0.73 cm2 consistent with severe aortic stenosis. After 23 mm S3 placement, V max 1.5 m/s, mean gradient 6 mmHG, EOA 1.90 cm2, DI 0.61. No paravalvular leak noted.  2. Successful placement of a 23 mm Sapien 3 TAVR. V max 1.5 m/s, mean gradient 6 mmHG, EOA 1.90 cm2, DI 0.61. No paravalvular leak noted. The aortic valve has been repaired/replaced. There is a 23 mm Edwards Sapien prosthetic (TAVR) valve present in the  aortic position. Procedure Date: 05/17/2020.  3. Left ventricular ejection fraction, by estimation, is 60 to 65%. The left ventricle has normal function. FINDINGS  Left Ventricle: Left ventricular ejection fraction, by estimation, is 60 to 65%. The left ventricle has normal function. Pericardium: There is no evidence of pericardial effusion. Aortic Valve: Successful placement of a 23 mm Sapien 3 TAVR. V max 1.5 m/s, mean gradient 6 mmHG, EOA 1.90 cm2, DI 0.61. No paravalvular leak noted. The aortic valve has been repaired/replaced. Aortic valve mean gradient measures 6.0 mmHg. Aortic valve peak gradient measures 9.0 mmHg. Aortic valve area, by VTI measures 1.82 cm. There is a 23 mm Edwards Sapien prosthetic, stented (TAVR) valve present in the aortic position. Procedure Date: 05/17/2020.  LEFT VENTRICLE PLAX 2D LVOT diam:     2.00 cm LV SV:         57 LV SV Index:   31 LVOT Area:     3.14 cm  AORTIC VALVE AV Area (Vmax):    1.92 cm AV Area (Vmean):   1.72 cm AV Area (VTI):     1.82 cm AV Vmax:           150.00 cm/s AV Vmean:          112.000 cm/s AV VTI:            0.314 m AV Peak Grad:      9.0 mmHg AV Mean Grad:      6.0 mmHg LVOT Vmax:         91.90 cm/s LVOT Vmean:        61.350 cm/s LVOT VTI:          0.182 m LVOT/AV VTI ratio: 0.58  SHUNTS Systemic VTI:  0.18 m Systemic Diam: 2.00 cm Lennie Odor MD Electronically signed by Lennie Odor MD Signature Date/Time:  05/17/2020/6:32:47 PM    Final    Structural Heart Procedure  Result Date: 05/17/2020 See surgical note for result.   Cardiac Studies   Coronary CT 05/15/20 ADDENDUM REPORT: 05/12/2020 16:38  CLINICAL DATA:  Severe Aortic Stenosis.  EXAM: Cardiac TAVR CT  TECHNIQUE: The patient was scanned on a [employer]. A 120 kV retrospective scan was triggered in the descending thoracic aorta at 111 HU's. Gantry rotation speed was 250 msecs and collimation was .6 mm. 10 mg IV metoprolol was given. No nitroglycerin was given. The 3D data set was reconstructed in 5% intervals of the R-R cycle. Systolic and diastolic phases were analyzed on a dedicated work station using MPR, MIP  and VRT modes. The patient received 80 cc of contrast.  FINDINGS: Image quality: Excellent.  Noise artifact is: Limited.  Valve Morphology: The aortic valve is tricuspid. There leaflets are severely calcified with restricted leaflet motion in systole. The RCC contains bulky calcifications extending from the leaflet tip to the base of the leaflet.  Aortic Valve Calcium score: 1733  Aortic annular dimension:  Phase assessed: 25%  Annular area: 421 mm2  Annular perimeter: 74.2 mm  Max diameter: 26.0 mm  Min diameter: 21.2 mm  Annular and subannular calcification: No significant annular or subannular calcifications.  Optimal coplanar projection: RAO 5, CRA 3  Coronary Artery Height above Annulus:  Left Main: 12.9 mm  Right Coronary: 15.4 mm  Sinus of Valsalva Measurements:  Non-coronary: 27 mm  Right-coronary: 27 mm  Left-coronary: 27 mm  Sinus of Valsalva Height:  Non-coronary: 19.0 mm  Right-coronary: 19.8 mm  Left-coronary: 19.1 mm  Sinotubular Junction: 25 mm.  Mild calcified plaque.  Ascending Thoracic Aorta: 32 mm.  Minimal calcified plaque.  Coronary Arteries: Normal coronary origin. Right dominance. The study was performed without use  of NTG and is insufficient for plaque evaluation. Please refer to recent cardiac catheterization for coronary assessment.  Cardiac Morphology:  Right Atrium: Right atrial size is within normal limits.  Right Ventricle: The right ventricular cavity is within normal limits.  Left Atrium: Left atrial size is normal in size with no left atrial appendage filling defect.  Left Ventricle: The ventricular cavity size is within normal limits. There are no stigmata of prior infarction. There is no abnormal filling defect. Hyperdynamic LV function, LVEF=79%. No regional wall motion abnormalities.  Pulmonary arteries: Normal in size without proximal filling defect.  Pulmonary veins: Normal pulmonary venous drainage.  Pericardium: Normal thickness with no significant effusion or calcium present.  Mitral Valve: The mitral valve is normal structure with mild mitral annular calcification.  Extra-cardiac findings: See attached radiology report for non-cardiac structures.  IMPRESSION: 1. Annular measurements appropriate for 23 mm Sapien 3 TAVR (421 mm2).  2. No significant annular or subannular calcifications.  3. Sufficient coronary to annulus distance.  4. Optimal Fluoroscopic Angle for Delivery: RAO 5 CRA 3  [place] T. Flora Lipps, MD   Electronically Signed   By: Lennie Odor   On: 05/12/2020 16:38    ___________________   CT angio 05/12/20 CLINICAL DATA:  74 year old female with history of severe aortic stenosis. Preprocedural study prior to potential transcatheter aortic valve replacement (TAVR) procedure.  EXAM: CT ANGIOGRAPHY CHEST, ABDOMEN AND PELVIS  TECHNIQUE: Chest CTA 05/11/2020.  CT the abdomen and pelvis 03/27/2014.  Multidetector CT imaging through the chest, abdomen and pelvis was performed using the standard protocol during bolus administration of intravenous contrast. Multiplanar reconstructed images and MIPs were obtained and  reviewed to evaluate the vascular anatomy.  CONTRAST:  OMNIPAQUE IOHEXOL 350 MG/ML SOLN  COMPARISON:  None.  FINDINGS: CTA CHEST FINDINGS  Cardiovascular: Heart size is normal. There is no significant pericardial fluid, thickening or pericardial calcification. There is aortic atherosclerosis, as well as atherosclerosis of the great vessels of the mediastinum and the coronary arteries, including calcified atherosclerotic plaque in the left main, left anterior descending, left circumflex and right coronary arteries. Thickening calcification of the aortic valve. Calcifications of the mitral annulus.  Mediastinum/Lymph Nodes: No pathologically enlarged mediastinal or hilar lymph nodes. Esophagus is unremarkable in appearance. No axillary lymphadenopathy.  Lungs/Pleura: No suspicious appearing pulmonary nodules or masses are noted. No acute consolidative airspace disease. No  pleural effusions.  Musculoskeletal/Soft Tissues: There are no aggressive appearing lytic or blastic lesions noted in the visualized portions of the skeleton.  CTA ABDOMEN AND PELVIS FINDINGS  Hepatobiliary: No suspicious cystic or solid hepatic lesions. No intra or extrahepatic biliary ductal dilatation. Status post cholecystectomy.  Pancreas: No pancreatic mass. No pancreatic ductal dilatation. No pancreatic or peripancreatic fluid collections or inflammatory changes.  Spleen: Unremarkable.  Adrenals/Urinary Tract: Bilateral kidneys and adrenal glands are normal in appearance. No hydroureteronephrosis. Urinary bladder is normal in appearance.  Stomach/Bowel: Normal appearance of the stomach. No pathologic dilatation of small bowel or colon. Numerous colonic diverticulae are noted, most severe in the descending colon and sigmoid colon, without surrounding inflammatory changes to suggest an acute diverticulitis at this time. Normal appendix.  Vascular/Lymphatic: Aortic  atherosclerosis, without evidence of aneurysm or dissection in the abdominal or pelvic vasculature. Vascular findings and measurements pertinent to potential TAVR procedure, as detailed below. No lymphadenopathy noted in the abdomen or pelvis.  Reproductive: Status post hysterectomy. Ovaries are not confidently identified and may be surgically absent or atrophic.  Other: Large umbilical hernia containing only omental fat. No significant volume of ascites. No pneumoperitoneum.  Musculoskeletal: Chronic appearing compression fracture at L1 with 25% loss of anterior vertebral body height. There are no aggressive appearing lytic or blastic lesions noted in the visualized portions of the skeleton.  VASCULAR MEASUREMENTS PERTINENT TO TAVR:  AORTA:  Minimal Aortic Diameter-12 x 12 mm  Severity of Aortic Calcification-severe  RIGHT PELVIS:  Right Common Iliac Artery -  Minimal Diameter-10.3 x 9.1 mm  Tortuosity - mild  Calcification-mild  Right External Iliac Artery -  Minimal Diameter-6.5 x 6.7 mm  Tortuosity - mild  Calcification-none  Right Common Femoral Artery -  Minimal Diameter-7.6 x 7.1 mm  Tortuosity - mild  Calcification-none  LEFT PELVIS:  Left Common Iliac Artery -  Minimal Diameter-10.1 x 9.9 mm  Tortuosity - mild  Calcification-mild  Left External Iliac Artery -  Minimal Diameter-7.2 x 6.6 mm  Tortuosity - mild  Calcification-none  Left Common Femoral Artery -  Minimal Diameter-7.3 x 6.9 mm  Tortuosity-mild  Calcification-mild  Review of the MIP images confirms the above findings.  IMPRESSION: 1. Vascular findings and measurements pertinent to potential TAVR procedure, as detailed above. 2. Severe thickening calcification of the aortic valve, compatible with reported clinical history of severe aortic stenosis. 3. Aortic atherosclerosis, in addition to left main and 3 vessel coronary artery  disease. 4. Severe colonic diverticulosis without evidence of acute diverticulitis at this time. 5. Additional incidental findings, as above.  _____________  TAVR OPERATIVE NOTE   Date of Procedure:                05/17/2020  Preoperative Diagnosis:      Severe Aortic Stenosis   Postoperative Diagnosis:    Same   Procedure:        Transcatheter Aortic Valve Replacement - Percutaneous Right Transfemoral Approach             Edwards Sapien 3 Ultra THV (size 23 mm, model # 9750TFX, serial # [id number])              Co-Surgeons:                        Alleen Borne, MD and Tonny Bollman, MD    Anesthesiologist:                  Jairo Ben,  MD  Echocardiographer:              Lacretia NicksW. O'Neal, MD  Pre-operative Echo Findings: ? Severe aortic stenosis ? Normal left ventricular systolic function  Post-operative Echo Findings: ? No paravalvular leak ? Normal left ventricular systolic function    Patient Profile     [name] is a 74 y.o. female with a history of HTN, HLD, uncontrolled DMT2, chronic LE edema, severe AS (undergoing TAVR work up) with recent admission for chest pain with NSTEMI (cath showed non obst CAD) who [facility] with syncope felt to be related to orthostasis and dehydration. Pt has remained inpatient for TAVR work up and surgery.  Assessment & Plan    Severe AS: s/p successful TAVR with a 23 mm Edwards Sapien 3 Ultra THV via the TF approach on 05/17/2020. Post operative echo pending. Groin sites are stable. ECG with sinus and no high grade heart block. Will repeat ECG this AM. Tele shows sinus. Continue Asprin and started on plavix 75 mg daily, which will be continued for 6 months. Plan for discharge today or tomorrow to a SNF or home with home health and her sister in law. Given physical debilitation will ask PT to see her again today for recommendations.  Syncope: felt to be related to orthostatic hypotension.  Orthostatic vital signs positive and lab work showed AKI. She has improved with holding antihypertensive and gentle hydration.  AKI: creat back to baseline. 1.14--> 0.65  HTN: BP well controlled off antihypertensives.   T2DM: continue SSI per primary team.   SVT: had a prolonged episode of mildly symptomatic SVT with a heart rate in the 130s on 6/7 evening with no reoccurrence.   SignedCline Crock, Kathryn Thompson, PA-C  05/18/2020, 8:36 AM  Pager [phone]  Patient seen, examined. Available data reviewed. Agree with findings, assessment, and plan as outlined by Carlean JewsKatie Thompson, PA-C.  On my exam she is alert, oriented, in no distress.  Lungs are clear, heart is regular rate and rhythm with a very soft 1/6 ejection murmur at the right upper sternal border, abdomen is soft and nontender, bilateral groin sites are clear without hematoma or ecchymosis, lower extremities have mild trace edema bilaterally.  The patient is clinically stable.  Her telemetry shows normal sinus rhythm with no bradycardic events.  I agree with physical therapy assessment.  I suspect she will be able to go home with her sister-in-law staying with her for a short period of time.  With her chronic leg edema, I think we should discharge her on furosemide 20 mg daily.  Tonny BollmanMichael Dacotah Cabello, M.D. 05/18/2020 9:20 AM

## 2020-05-18 NOTE — Progress Notes (Signed)
Inpatient Diabetes Program Recommendations  AACE/ADA: New Consensus Statement on Inpatient Glycemic Control (2015)  Target Ranges:  Prepandial:   less than 140 mg/dL      Peak postprandial:   less than 180 mg/dL (1-2 hours)      Critically ill patients:  140 - 180 mg/dL   Lab Results  Component Value Date   GLUCAP 203 (H) 05/18/2020   HGBA1C 11.2 (A) 03/20/2020   HGBA1C 11.2 03/20/2020   HGBA1C 11.2 (A) 03/20/2020   HGBA1C 11.2 (A) 03/20/2020    Review of Glycemic Control Results for [name], [name] (MRN [id number]) as of 05/18/2020 10:03  Ref. Range 05/16/2020 16:08 05/16/2020 21:24 05/17/2020 05:52 05/17/2020 11:07 05/17/2020 21:21 05/18/2020 06:01  Glucose-Capillary Latest Ref Range: 70 - 99 mg/dL 121 (H) 624 (H) 469 (H) 173 (H) 199 (H) 203 (H)   Diabetes history: DM 2 Outpatient Diabetes medications: 75/25 45 units bid, metformin 500 mg bid Current orders for Inpatient glycemic control:  70/30 15 units bid Novolog 0-15 units tid + hs  Inpatient Diabetes Program Recommendations:    Pt has not had 70/30 insulin in 36 hours. Scheduled to get dose this am. Fasting glucose 203. Will follow trends for now.  Thanks,  Christena Deem RN, MSN, BC-ADM Inpatient Diabetes Coordinator Team Pager [phone] (8a-5p)

## 2020-05-18 NOTE — Progress Notes (Signed)
PT assisted transfering from chair to bed. After multiple attempts, pt couldn't stand by herself from chair. Required assistance 1 to 1. Reports feeling better than before

## 2020-05-18 NOTE — Progress Notes (Addendum)
Physical Therapy Treatment Patient Details Name: [name] MRN: [id number] DOB: 1946-04-13 Today's Date: 05/18/2020    History of Present Illness Pt is a 74 y/o female admitted secondary to syncope and positive orthostatics. Pt with recent admission secondary to severe aortic stenosis and underwent cardica cath. Pt underwent TAVR on 05/17/20. PMH includes DM, HTN, and gerd.     PT Comments    Pt underwent TAVR yesterday. Requiring a little more assist today and continues to have decr activity tolerance. Goals remain appropriate. Continue to recommend ST-SNF prior to return home to maximize rehab potential. If pt refuses SNF would maximize [facility] services.    Follow Up Recommendations  SNF (pt may refuse)     Equipment Recommendations  Other (comment) (possibly rollator)    Recommendations for Other Services       Precautions / Restrictions Precautions Precautions: Fall;Other (comment) Precaution Comments: watch BP     Mobility  Bed Mobility Overal bed mobility: Needs Assistance Bed Mobility: Supine to Sit     Supine to sit: Min assist     General bed mobility comments: Assist to elevate trunk into sitting  Transfers Overall transfer level: Needs assistance Equipment used: Rolling walker (2 wheeled) Transfers: Sit to/from [facility] Sit to Stand: Min assist Stand pivot transfers: Min assist       General transfer comment: Assist to bring hips up and for balance. Bed to [facility] with walker  Ambulation/Gait Ambulation/Gait assistance: Min assist Gait Distance (Feet): 80 Feet Assistive device: Rolling walker (2 wheeled) Gait Pattern/deviations: Step-through pattern;Decreased stride length;Decreased stance time - left Gait velocity: decreased Gait velocity interpretation: <1.31 ft/sec, indicative of household ambulator General Gait Details: Assist with balance and support.   Stairs             Wheelchair Mobility    Modified Rankin  (Stroke Patients Only)       Balance Overall balance assessment: Needs assistance Sitting-balance support: Feet supported;No upper extremity supported Sitting balance-Leahy Scale: Fair     Standing balance support: During functional activity Standing balance-Leahy Scale: Poor Standing balance comment: walker and min guard for static standing                            Cognition Arousal/Alertness: Awake/alert Behavior During Therapy: WFL for tasks assessed/performed Overall Cognitive Status: Within Functional Limits for tasks assessed                                        Exercises      General Comments General comments (skin integrity, edema, etc.): VSS on RA      Pertinent Vitals/Pain Pain Assessment: No/denies pain    Home Living                      Prior Function            PT Goals (current goals can now be found in the care plan section) Acute Rehab PT Goals Patient Stated Goal: To go home to own house Progress towards PT goals: Not progressing toward goals - comment    Frequency    Min 3X/week      PT Plan Current plan remains appropriate    Co-evaluation              AM-PAC PT "6 Clicks" Mobility   Outcome  Measure  Help needed turning from your back to your side while in a flat bed without using bedrails?: None Help needed moving from lying on your back to sitting on the side of a flat bed without using bedrails?: A Little Help needed moving to and from a bed to a chair (including a wheelchair)?: A Little Help needed standing up from a chair using your arms (e.g., wheelchair or bedside chair)?: A Little Help needed to walk in hospital room?: A Little Help needed climbing 3-5 steps with a railing? : A Lot 6 Click Score: 18    End of Session Equipment Utilized During Treatment: Gait belt Activity Tolerance: Patient limited by fatigue Patient left: in chair;with call bell/phone within reach;with chair  alarm set Nurse Communication: Mobility status PT Visit Diagnosis: Unsteadiness on feet (R26.81);Muscle weakness (generalized) (M62.81);History of falling (Z91.81)     Time: 0100-7121 PT Time Calculation (min) (ACUTE ONLY): 34 min  Charges:  $Gait Training: 23-37 mins                     [facility] PT Acute Rehabilitation Services Pager [phone] Office [phone]    Angelina Ok [facility] 05/18/2020, 2:57 PM

## 2020-05-18 NOTE — Progress Notes (Signed)
PROGRESS NOTE    [name]  WUJ:[id number] DOB: [birth year] DOA: 05/10/2020 PCP: Myrlene Brokerrawford, [name], MD   Brief Narrative:  Cynthia LexRuth Frances Rudell is an 74 y.o. female with medical history significant ofhypertension, hyperlipidemia, CAD, severe aortic stenosis, diabetes mellitus type 2,andchronic back painwho presents after having a fall with loss of consciousness. She had just been recently hospitalized from 5/28-6/2 with complaints of chest pain with NSTEMI. She underwent cardiac cath which showed nonobstructive disease. Since leaving the hospital patient reports that she has been constantly dizzy. She is woken up on the floor at least 3-4 times. Denies having any chest pain or shortness of breath. She had been nauseated 3 to 4 days prior to coming into the hospital initially on 5/28, but reports that those symptoms have now resolved.   Assessment & Plan:   Principal Problem:   Syncope Active Problems:   Type II diabetes mellitus with neurological manifestations, uncontrolled (HCC)   Hyperlipidemia associated with type 2 diabetes mellitus (HCC)   Essential hypertension   AKI (acute kidney injury) (HCC)   Aortic stenosis   Pressure injury of skin   Orthostatic hypotension   Dental caries   Severe aortic stenosis   S/P TAVR (transcatheter aortic valve replacement)   Syncope secondary to orthostatic hypotension: Acute. Patient presented after having multiple syncopal episodes at home. CTA of the chest did not note any signs of a pulmonary embolus. Patient denies any complaints of chest pain. Orthostatic vital signs noted to be positive.  Now feeling much better.  No more dizziness.  History of aortic stenosis:-per cards: S/p TAVR done on 6/9.  Waiting for repeat echo results and further clarification of plan of care by cardiothoracic surgery and cardiology.  Urinary tract infection: Acute.She reports having dysuria. UA positive for signs of infection.    -Urine culture: multiple species -Rocephin IV x3 days completed  Acute kidney injury:  -resolved  Essential hypertension: Blood pressure very well controlled despite of holding her antihypertensives which are lisinopril and metoprolol.  Per cardiology.  Dental caries: Patient noted to have multiple missing teeth with dental caries involving 3 of the residual segment mandibular teeth. Patient need to follow up with dentist at discharge   Diabetes mellitus type 2: On admission blood sugar elevated to 261. Home medications include Metformin 500 mg twice daily and Humalog75/25 mix 45 units twicedaily.  She is on 15 units of 75/25 and her blood sugars very labile and elevated.  Now that she is eating so I will increase 75/25 to 25 units twice daily.  Continue SSI.  Dyslipidemia: Home medications include Zetia 10 mg daily and atorvastatin 40 mg daily. -Continue current home regimen  GERD: Home medications include Nexium 40 mg daily. -Continue pharmacy substitution of Protonix  obesity Body mass index is 30.65 kg/m.  DVT prophylaxis: Lovenox   Code Status: Full Code  Family Communication:  None present at bedside.  Plan of care discussed with patient in length and he verbalized understanding and agreed with it. Patient is from: Home Disposition Plan: Home vs snf when cleared by cardiothoracic surgery and cardiology Barriers to discharge: PT assessment and echo  Status is: Inpatient  Remains inpatient appropriate because:Ongoing diagnostic testing needed not appropriate for outpatient work up   Dispo: The patient is from: Home              Anticipated d/c is to: Home              Anticipated d/c date is:  1 day              Patient currently is medically stable for DC.        Estimated body mass index is 30.65 kg/m as calculated from the following:   Height as of this encounter: 5\' 4"  (1.626 m).   Weight as of this encounter: 81 kg.  Pressure Injury 05/11/20 Coccyx  Medial Stage 2 -  Partial thickness loss of dermis presenting as a shallow open injury with a red, pink wound bed without slough. Shallow open area between buttocks (Active)  05/11/20 1500  Location: Coccyx  Location Orientation: Medial  Staging: Stage 2 -  Partial thickness loss of dermis presenting as a shallow open injury with a red, pink wound bed without slough.  Wound Description (Comments): Shallow open area between buttocks  Present on Admission: Yes     Nutritional status:               Consultants:   Cardiology and cardiothoracic surgery  Procedures:   TAVR 05/17/2020  Antimicrobials:  Anti-infectives (From admission, onward)   Start     Dose/Rate Route Frequency Ordered Stop   05/17/20 2330  vancomycin (VANCOCIN) IVPB 1000 mg/200 mL premix        1,000 mg 200 mL/hr over 60 Minutes Intravenous  Once 05/17/20 1853 05/18/20 0200   05/17/20 1930  cefUROXime (ZINACEF) 1.5 g in sodium chloride 0.9 % 100 mL IVPB     Discontinue     1.5 g 200 mL/hr over 30 Minutes Intravenous Every 12 hours 05/17/20 1853 05/19/20 1929   05/17/20 0400  vancomycin (VANCOREADY) IVPB 1250 mg/250 mL        1,250 mg 166.7 mL/hr over 90 Minutes Intravenous To Surgery 05/16/20 0745 05/17/20 1534   05/17/20 0400  cefUROXime (ZINACEF) 1.5 g in sodium chloride 0.9 % 100 mL IVPB        1.5 g 200 mL/hr over 30 Minutes Intravenous To Surgery 05/16/20 0745 05/17/20 1455   05/11/20 1115  cefTRIAXone (ROCEPHIN) 1 g in sodium chloride 0.9 % 100 mL IVPB        1 g 200 mL/hr over 30 Minutes Intravenous Every 24 hours 05/11/20 1104 05/13/20 1217         Subjective: Patient seen and examined.  She has no complaints.  Feels well.  Objective: Vitals:   05/18/20 0446 05/18/20 0833 05/18/20 1000 05/18/20 1137  BP: 112/73 (!) 144/60 97/72 (!) 116/47  Pulse: 81 86 91 90  Resp: 19 16  18   Temp: 98.6 F (37 C) 99.3 F (37.4 C) 97.9 F (36.6 C) 98.4 F (36.9 C)  TempSrc: Oral Oral Oral Oral    SpO2: 96% 92% 92% 92%  Weight:      Height:        Intake/Output Summary (Last 24 hours) at 05/18/2020 1331 Last data filed at 05/18/2020 0834 Gross per 24 hour  Intake 100 ml  Output 1100 ml  Net -1000 ml   Filed Weights   05/15/20 0500 05/16/20 0531 05/17/20 1255  Weight: 82 kg 81 kg 81 kg    Examination:  General exam: Appears calm and comfortable  Respiratory system: Clear to auscultation. Respiratory effort normal. Cardiovascular system: S1 & S2 heard, RRR. No JVD, , rubs, gallops or clicks.  +1 pitting edema bilateral lower extremity.  3/6 systolic murmur Gastrointestinal system: Abdomen is nondistended, soft and nontender. No organomegaly or masses felt. Normal bowel sounds heard. Central nervous system: Alert and oriented.  No focal neurological deficits. Extremities: Symmetric 5 x 5 power. Skin: No rashes, lesions or ulcers.  Psychiatry: Judgement and insight appear normal. Mood & affect appropriate.   Data Reviewed: I have personally reviewed following labs and imaging studies  CBC: Recent Labs  Lab 05/12/20 0752 05/12/20 0752 05/13/20 0708 05/13/20 0708 05/14/20 0445 05/14/20 0445 05/17/20 0454 05/17/20 1424 05/17/20 1513 05/17/20 1611 05/18/20 0238  WBC 9.6  --  9.2  --  8.1  --  9.5  --   --   --  12.1*  HGB 13.5   < > 14.2   < > 13.2   < > 13.7 13.6 12.9 12.2 14.0  HCT 42.4   < > 44.9   < > 41.9   < > 43.0 40.0 38.0 36.0 42.5  MCV 85.0  --  86.5  --  85.9  --  85.3  --   --   --  83.7  PLT 216  --  265  --  239  --  271  --   --   --  212   < > = values in this interval not displayed.   Basic Metabolic Panel: Recent Labs  Lab 05/12/20 0752 05/12/20 0752 05/13/20 0708 05/13/20 0708 05/14/20 0445 05/14/20 0445 05/17/20 0454 05/17/20 1424 05/17/20 1513 05/17/20 1611 05/18/20 0238  NA 136   < > 138   < > 139   < > 138 138 138 138 133*  K 4.2   < > 4.3   < > 4.8   < > 4.5 4.5 4.5 4.8 4.0  CL 99   < > 98   < > 101   < > 103 100 99 100 101   CO2 27  --  30  --  29  --  29  --   --   --  22  GLUCOSE 142*   < > 95   < > 111*   < > 198* 151* 173* 188* 291*  BUN 17   < > 18   < > 16   < > CREATININE 0.70   < > 0.70   < > 0.73   < > 0.81 0.60 0.50 0.60 0.65  CALCIUM 9.1  --  9.4  --  8.9  --  9.1  --   --   --  8.4*  MG  --   --   --   --   --   --   --   --   --   --  1.6*   < > = values in this interval not displayed.   GFR: Estimated Creatinine Clearance: 64.5 mL/min (by C-G formula based on SCr of 0.65 mg/dL). Liver Function Tests: No results for input(s): AST, ALT, ALKPHOS, BILITOT, PROT, ALBUMIN in the last 168 hours. No results for input(s): LIPASE, AMYLASE in the last 168 hours. No results for input(s): AMMONIA in the last 168 hours. Coagulation Profile: Recent Labs  Lab 05/16/20 1819  INR 1.0   Cardiac Enzymes: No results for input(s): CKTOTAL, CKMB, CKMBINDEX, TROPONINI in the last 168 hours. BNP (last 3 results) No results for input(s): PROBNP in the last 8760 hours. HbA1C: No results for input(s): HGBA1C in the last 72 hours. CBG: Recent Labs  Lab 05/17/20 0552 05/17/20 1107 05/17/20 2121 05/18/20 0601 05/18/20 1135  GLUCAP 187* 173* 199* 203* 217*   Lipid Profile: No results for input(s): CHOL, HDL, LDLCALC, TRIG, CHOLHDL,  LDLDIRECT in the last 72 hours. Thyroid Function Tests: No results for input(s): TSH, T4TOTAL, FREET4, T3FREE, THYROIDAB in the last 72 hours. Anemia Panel: No results for input(s): VITAMINB12, FOLATE, FERRITIN, TIBC, IRON, RETICCTPCT in the last 72 hours. Sepsis Labs: No results for input(s): PROCALCITON, LATICACIDVEN in the last 168 hours.  Recent Results (from the past 240 hour(s))  SARS Coronavirus 2 by RT PCR (hospital order, performed in [facility] hospital lab) Nasopharyngeal Nasopharyngeal Swab     Status: None   Collection Time: 05/11/20 12:49 AM   Specimen: Nasopharyngeal Swab  Result Value Ref Range Status   SARS Coronavirus 2 NEGATIVE NEGATIVE  Final    Comment: (NOTE) SARS-CoV-2 target nucleic acids are NOT DETECTED. The SARS-CoV-2 RNA is generally detectable in upper and lower respiratory specimens during the acute phase of infection. The lowest concentration of SARS-CoV-2 viral copies this assay can detect is 250 copies / mL. A negative result does not preclude SARS-CoV-2 infection and should not be used as the sole basis for treatment or other patient management decisions.  A negative result may occur with improper specimen collection / handling, submission of specimen other than nasopharyngeal swab, presence of viral mutation(s) within the areas targeted by this assay, and inadequate number of viral copies (<250 copies / mL). A negative result must be combined with clinical observations, patient history, and epidemiological information. Fact Sheet for Patients:   BoilerBrush.com.cy Fact Sheet for Healthcare Providers: https://pope.com/ This test is not yet approved or cleared  by the [place] FDA and has been authorized for detection and/or diagnosis of SARS-CoV-2 by FDA under an Emergency Use Authorization (EUA).  This EUA will remain in effect (meaning this test can be used) for the duration of the COVID-19 declaration under Section 564(b)(1) of the Act, 21 U.S.C. section 360bbb-3(b)(1), unless the authorization is terminated or revoked sooner. Performed at [facility], [address], [place], [place] [address]   Urine culture     Status: Abnormal   Collection Time: 05/11/20  2:20 AM   Specimen: Urine, Random  Result Value Ref Range Status   Specimen Description URINE, RANDOM  Final   Special Requests   Final    NONE Performed at [facility], [address], [place], [place] [address]    Culture MULTIPLE SPECIES PRESENT, SUGGEST RECOLLECTION (A)  Final   Report Status 05/11/2020 FINAL  Final      Radiology Studies: DG Chest 2 View  Result  Date: 05/16/2020 CLINICAL DATA:  Preoperative examination in patient for aortic valve replacement. EXAM: CHEST - 2 VIEW COMPARISON:  Single-view of the chest 05/10/2020. CT chest 05/11/2020. FINDINGS: Lungs clear. Heart size normal. No pneumothorax or pleural fluid. No acute or focal bony abnormality. IMPRESSION: No acute disease. Electronically Signed   By: Drusilla Kanner M.D.   On: 05/16/2020 18:53   ECHOCARDIOGRAM LIMITED  Result Date: 05/17/2020    ECHOCARDIOGRAM LIMITED REPORT   Patient Name:   ASHIAH KARPOWICZ Date of Exam: 05/17/2020 Medical Rec #:  [id number]             Height:       64.0 in Accession #:    [id number]            Weight:       178.6 lb Date of Birth:  Sep 05, 1946             BSA:          1.864 m Patient Age:  74 years              BP:           120/63 mmHg Patient Gender: F                     HR:           66 bpm. Exam Location:  Inpatient Procedure: Limited Echo, Color Doppler and Cardiac Doppler Indications:    Aortic Stenosis  History:        Patient has prior history of Echocardiogram examinations, most                 recent 05/06/2020. Risk Factors:Hypertension, Dyslipidemia and                 Diabetes. GERD.                 Aortic Valve: 23 mm Edwards Sapien prosthetic, stented (TAVR)                 valve is present in the aortic position. Procedure Date:                 05/17/2020.  Sonographer:    Leta Jungling RDCS Sonographer#2:  Leta Jungling RDCS Referring Phys: [id number] Wille Celeste THOMPSON IMPRESSIONS  1. Echo guided TAVR procedure. Preprocedure Vmax 3.3 m/s, mean gradient 26 mmHG, AVA 0.73 cm2 consistent with severe aortic stenosis. After 23 mm S3 placement, V max 1.5 m/s, mean gradient 6 mmHG, EOA 1.90 cm2, DI 0.61. No paravalvular leak noted.  2. Successful placement of a 23 mm Sapien 3 TAVR. V max 1.5 m/s, mean gradient 6 mmHG, EOA 1.90 cm2, DI 0.61. No paravalvular leak noted. The aortic valve has been repaired/replaced. There is a 23 mm Edwards Sapien prosthetic  (TAVR) valve present in the  aortic position. Procedure Date: 05/17/2020.  3. Left ventricular ejection fraction, by estimation, is 60 to 65%. The left ventricle has normal function. FINDINGS  Left Ventricle: Left ventricular ejection fraction, by estimation, is 60 to 65%. The left ventricle has normal function. Pericardium: There is no evidence of pericardial effusion. Aortic Valve: Successful placement of a 23 mm Sapien 3 TAVR. V max 1.5 m/s, mean gradient 6 mmHG, EOA 1.90 cm2, DI 0.61. No paravalvular leak noted. The aortic valve has been repaired/replaced. Aortic valve mean gradient measures 6.0 mmHg. Aortic valve peak gradient measures 9.0 mmHg. Aortic valve area, by VTI measures 1.82 cm. There is a 23 mm Edwards Sapien prosthetic, stented (TAVR) valve present in the aortic position. Procedure Date: 05/17/2020.  LEFT VENTRICLE PLAX 2D LVOT diam:     2.00 cm LV SV:         57 LV SV Index:   31 LVOT Area:     3.14 cm  AORTIC VALVE AV Area (Vmax):    1.92 cm AV Area (Vmean):   1.72 cm AV Area (VTI):     1.82 cm AV Vmax:           150.00 cm/s AV Vmean:          112.000 cm/s AV VTI:            0.314 m AV Peak Grad:      9.0 mmHg AV Mean Grad:      6.0 mmHg LVOT Vmax:         91.90 cm/s LVOT Vmean:        61.350 cm/s LVOT VTI:  0.182 m LVOT/AV VTI ratio: 0.58  SHUNTS Systemic VTI:  0.18 m Systemic Diam: 2.00 cm Lennie Odor MD Electronically signed by Lennie Odor MD Signature Date/Time: 05/17/2020/6:32:47 PM    Final    Structural Heart Procedure  Result Date: 05/17/2020 See surgical note for result.   Scheduled Meds: . aspirin EC  81 mg Oral Daily  . atorvastatin  40 mg Oral Daily  . citalopram  20 mg Oral Daily  . clopidogrel  75 mg Oral Q breakfast  . ezetimibe  10 mg Oral Daily  . furosemide  20 mg Oral Daily  . gabapentin  300 mg Oral TID  . insulin aspart  0-15 Units Subcutaneous TID WC  . insulin aspart  0-5 Units Subcutaneous QHS  . insulin aspart protamine- aspart  15 Units  Subcutaneous BID WC  . pantoprazole  40 mg Oral Daily  . sodium chloride flush  3 mL Intravenous Q12H  . sodium chloride flush  3 mL Intravenous Q12H   Continuous Infusions: . sodium chloride    . cefUROXime (ZINACEF)  IV 1.5 g (05/18/20 0646)  . lactated ringers 10 mL/hr at 05/17/20 1310  . nitroGLYCERIN    . phenylephrine (NEO-SYNEPHRINE) Adult infusion       LOS: 7 days   Time spent: 30 minutes   Hughie Closs, MD [facility]  05/18/2020, 1:31 PM   To contact the attending provider between 7A-7P or the covering provider during after hours 7P-7A, please log into the web site [email or web address].

## 2020-05-18 NOTE — Progress Notes (Signed)
CARDIAC REHAB PHASE I   PRE:  Rate/Rhythm: 80 SR    BP: sitting 112/89    SaO2: 94 RA  MODE:  Ambulation: 140 ft   POST:  Rate/Rhythm: 105 ST    BP: sitting 126/52     SaO2: 94 Ra  Pt incontinent of urine upon getting up going to [facility]. Able to stand and wiipe herself, wash hands, then ambulate hall with RW. Fairly steady. To recliner after walk on chair alarm. VSS. She does not want to go to SNF. Sts her sister can be with her. Willl f/u tomorrow. [phone]   Harriet Masson CES, ACSM 05/18/2020 3:39 PM

## 2020-05-18 NOTE — Progress Notes (Signed)
  Echocardiogram 2D Echocardiogram has been performed.  Kane Kusek G Zamiya Dillard 05/18/2020, 11:41 AM

## 2020-05-18 NOTE — Care Management Important Message (Signed)
Important Message  Patient Details  Name: [name] MRN: [id number] Date of Birth: 08-21-1946   Medicare Important Message Given:  Yes     Renie Ora 05/18/2020, 9:17 AM

## 2020-05-19 ENCOUNTER — Other Ambulatory Visit (HOSPITAL_COMMUNITY): Payer: Medicare Other

## 2020-05-19 LAB — GLUCOSE, CAPILLARY
Glucose-Capillary: 150 mg/dL — ABNORMAL HIGH (ref 70–99)
Glucose-Capillary: 121 mg/dL — ABNORMAL HIGH (ref 70–99)

## 2020-05-19 MED ORDER — FUROSEMIDE 20 MG PO TABS: 20.0000 mg | ORAL_TABLET | Freq: Every day | ORAL | 0 refills | Status: DC

## 2020-05-19 MED ORDER — CLOPIDOGREL BISULFATE 75 MG PO TABS: 75.0000 mg | ORAL_TABLET | Freq: Every day | ORAL | 0 refills | Status: AC

## 2020-05-19 NOTE — Progress Notes (Signed)
Physical Therapy Treatment Patient Details Name: [name] MRN: [id number] DOB: 03/28/1946 Today's Date: 05/19/2020    History of Present Illness Pt is a 74 y/o female admitted secondary to syncope and positive orthostatics. Pt with recent admission secondary to severe aortic stenosis and underwent cardica cath. Pt underwent TAVR on 05/17/20. PMH includes DM, HTN, and gerd.     PT Comments    Pt with steady progress. Pt did not want to go to SNF and is returning home with sister in law.    Follow Up Recommendations  Supervision for mobility/OOB;Outpatient PT (pt refusing SNF and HH not an option)     Equipment Recommendations  Other (comment) ( rollator)    Recommendations for Other Services       Precautions / Restrictions Precautions Precautions: Fall;Other (comment) Precaution Comments: watch BP     Mobility  Bed Mobility Overal bed mobility: Modified Independent Bed Mobility: Supine to Sit;Sit to Supine     Supine to sit: Modified independent (Device/Increase time) Sit to supine: Modified independent (Device/Increase time)   General bed mobility comments: Incr time and effort   Transfers Overall transfer level: Needs assistance Equipment used: 4-wheeled walker Transfers: Sit to/from [facility] Sit to Stand: Min guard         General transfer comment: Assist for safety.  Ambulation/Gait Ambulation/Gait assistance: Min guard Gait Distance (Feet): 90 Feet Assistive device: 4-wheeled walker Gait Pattern/deviations: Step-through pattern;Decreased stride length Gait velocity: decreased Gait velocity interpretation: 1.31 - 2.62 ft/sec, indicative of limited community ambulator General Gait Details: Assist for safety   Stairs             Wheelchair Mobility    Modified Rankin (Stroke Patients Only)       Balance Overall balance assessment: Needs assistance Sitting-balance support: Feet supported;No upper extremity  supported Sitting balance-Leahy Scale: Fair     Standing balance support: During functional activity Standing balance-Leahy Scale: Poor Standing balance comment: walker and min guard for static standing                            Cognition Arousal/Alertness: Awake/alert Behavior During Therapy: WFL for tasks assessed/performed Overall Cognitive Status: Within Functional Limits for tasks assessed                                        Exercises      General Comments        Pertinent Vitals/Pain Pain Assessment: No/denies pain    Home Living                      Prior Function            PT Goals (current goals can now be found in the care plan section) Acute Rehab PT Goals Patient Stated Goal: To go home to own house Progress towards PT goals: Progressing toward goals    Frequency    Min 3X/week      PT Plan Discharge plan needs to be updated    Co-evaluation              AM-PAC PT "6 Clicks" Mobility   Outcome Measure  Help needed turning from your back to your side while in a flat bed without using bedrails?: None Help needed moving from lying on your back to sitting on the  side of a flat bed without using bedrails?: A Little Help needed moving to and from a bed to a chair (including a wheelchair)?: A Little Help needed standing up from a chair using your arms (e.g., wheelchair or bedside chair)?: A Little Help needed to walk in hospital room?: A Little Help needed climbing 3-5 steps with a railing? : A Lot 6 Click Score: 18    End of Session   Activity Tolerance: Patient tolerated treatment well Patient left: in chair;with call bell/phone within reach;with bed alarm set Nurse Communication: Mobility status PT Visit Diagnosis: Unsteadiness on feet (R26.81);Muscle weakness (generalized) (M62.81);History of falling (Z91.81)     Time: 3419-3790 PT Time Calculation (min) (ACUTE ONLY): 19 min  Charges:   $Gait Training: 8-22 mins                     [facility] PT Acute Rehabilitation Services Pager [phone] Office [phone]    Angelina Ok [facility] 05/19/2020, 3:38 PM

## 2020-05-19 NOTE — Care Management (Signed)
05-19-20 1243 Case Manager offered the patient the Medicare.gov list for home health services since she declined skilled nursing facility. Patient did not have preference-all agencies on list were called and none were willing to accept the patient. Case Manager offered patient outpatient physical therapy-patient and physician agreeable. Patient has an ambulatory referral sent to [facility] location for outpatient therapy. Patient states that her sister will be able to transition her to therapy. Referral for rollator was sent to [employer] and equipment will be delivered to the room.  No further needs from Case Manager at this time. Gala Lewandowsky , RN,BSN Case Manager

## 2020-05-19 NOTE — Progress Notes (Signed)
Discussed restrictions, exercise guidelines (as tolerated) and CRPII. Pt is interested in CRPII if she has transportation. Will refer to [place]. Will need help with her phone app. [phone] Ethelda Chick CES, ACSM 10:16 AM 05/19/2020

## 2020-05-19 NOTE — Discharge Summary (Signed)
Physician Discharge Summary  [name] XBJ:[id number] DOB: 08-10-1946 DOA: 05/10/2020  PCP: Cynthia Broker, MD  Admit date: 05/10/2020 Discharge date: 05/19/2020  Admitted From: Home Disposition: Home  Recommendations for Outpatient Follow-up:  1. Follow up with PCP in 1-2 weeks 2. Follow with cardiology in 1 week 3. Please have sleep study done as outpatient.  Please coordinate with PCP to set that up. 4. Please obtain BMP/CBC in one week 5. Please follow up with your PCP on the following pending results: Unresulted Labs (From admission, onward) Comment         None       Home Health: Yes Equipment/Devices: None  Discharge Condition: Stable CODE STATUS: Full code Diet recommendation: Cardiac  Subjective: Patient seen and examined.  She has no complaint.  Brief/Interim Summary: Cynthia Roettger Petersonis an 74 y.o.femalewith medical history significant ofhypertension, hyperlipidemia, CAD, severe aortic stenosis, diabetes mellitus type 2,andchronic back painwho presented after having a fall with loss of consciousness. She had just been recently hospitalized from 5/28-6/2 with complaints of chest pain with NSTEMI. She underwent cardiac cath which showed nonobstructive disease. Since leaving the hospital patient reported that she has been constantly dizzy. She is woken up on the floor at least 3-4 times. Denied having any chest pain or shortness of breath. She had been nauseated 3 to 4 days prior to coming into the hospital initially on 5/28, but reports that those symptoms have now resolved. She was found to be positive orthostatics which seems to be the cause of her syncope.  She was admitted under hospitalist service.  Subsequently due to her known severe aortic stenosis, cardiology was consulted who in turn consulted cardiothoracic surgery and patient underwent TAVR on 05/17/2020.  She also had UTI for which she received 3 doses of IV Rocephin during this  hospitalization.  She also had mild acute kidney injury which resolved after hydration.  Her antihypertensives were held due to orthostatic hypotension.  She was on lisinopril and metoprolol.  She had 1 episode of mildly symptomatic SVT.  She has been cleared by cardiothoracic surgery and cardiology to discharge yesterday however patient was not ready to go home yesterday so she was kept in the hospital overnight.  She was seen by PT OT and they recommended SNF however patient adamant on going home with home health as she has a sister who is currently with her for next 1 month to take care of her and provide 24/7 care.  Patient noted to have multiple missing teeth with dental caries involving 3 of the residual segment mandibular teeth.  Patient is highly recommended to follow-up with dentist as soon as possible.  As her blood pressure had remained within normal range while holding lisinopril and metoprolol, at discharge, I am discontinuing her lisinopril but will resume her metoprolol to prevent SVTs in the future.  Per cardiology recommendations, she is also being discharged on Lasix 20 mg as well as DAPT with aspirin and Plavix.  She is to follow-up with PCP within a week and cardiology.  Of note, last night she was found to be slightly hypoxic while sleeping.  This raises concern for possible sleep apnea.  Patient unaware of that and has never been tested for this.  I recommend that she gets sleep study done as outpatient for further evaluation.  Discharge Diagnoses:  Principal Problem:   Syncope Active Problems:   Type II diabetes mellitus with neurological manifestations, uncontrolled (HCC)   Hyperlipidemia associated with type 2 diabetes  mellitus (HCC)   Essential hypertension   AKI (acute kidney injury) (HCC)   Aortic stenosis   Pressure injury of skin   Orthostatic hypotension   Dental caries   Severe aortic stenosis   S/P TAVR (transcatheter aortic valve replacement)    Discharge  Instructions   Allergies as of 05/19/2020      Reactions   Morphine Other (See Comments)   'took me out of this world' I had to be resuscitated   Codeine Sulfate Other (See Comments)   GI upset and pain   Demerol [meperidine] Nausea And Vomiting   Meperidine Hcl Nausea And Vomiting, Swelling   Propoxyphene Hcl Other (See Comments)   Darvocet caused sick headache      Medication List    STOP taking these medications   lisinopril 20 MG tablet Commonly known as: ZESTRIL     TAKE these medications   acetaminophen 325 MG tablet Commonly known as: TYLENOL Take 325 mg by mouth daily as needed for mild pain or headache.   ASPERCREME EX Apply 1 application topically daily as needed (shoulder/hips/knee/knuckles).   aspirin 81 MG tablet Take 81 mg by mouth daily.   atorvastatin 40 MG tablet Commonly known as: LIPITOR Take 1 tablet (40 mg total) by mouth daily.   citalopram 20 MG tablet Commonly known as: CELEXA Take 1 tablet (20 mg total) by mouth daily.   clopidogrel 75 MG tablet Commonly known as: PLAVIX Take 1 tablet (75 mg total) by mouth daily with breakfast. Start taking on: May 20, 2020   esomeprazole 20 MG capsule Commonly known as: NexIUM Take 1 capsule (20 mg total) by mouth daily at 12 noon.   ezetimibe 10 MG tablet Commonly known as: ZETIA Take 1 tablet (10 mg total) by mouth daily.   FreeStyle [employer] Sensor System Misc Use to monitor sugars   furosemide 20 MG tablet Commonly known as: LASIX Take 1 tablet (20 mg total) by mouth daily. Start taking on: May 20, 2020 What changed:   medication strength  how much to take   gabapentin 300 MG capsule Commonly known as: NEURONTIN Take 1 capsule (300 mg total) by mouth 3 (three) times daily.   Insulin Lispro Prot & Lispro (75-25) 100 UNIT/ML Kwikpen Commonly known as: HumaLOG Mix 75/25 KwikPen Inject 44 Units into the skin 2 (two) times daily. What changed: how much to take   Insulin Pen Needle 31G  X 8 MM Misc Use to inject insulin four times daily E11.41   metFORMIN 1000 MG tablet Commonly known as: GLUCOPHAGE Take 0.5 tablets (500 mg total) by mouth 2 (two) times daily with a meal.   metoprolol tartrate 25 MG tablet Commonly known as: LOPRESSOR Take 0.5 tablets (12.5 mg total) by mouth 2 (two) times daily.   OneTouch Verio test strip Generic drug: glucose blood Use as instructed   sodium chloride 0.65 % Soln nasal spray Commonly known as: OCEAN Place 1 spray into both nostrils 4 (four) times daily. What changed:   when to take this  reasons to take this   tetrahydrozoline 0.05 % ophthalmic solution Place 1-2 drops into both eyes 2 (two) times daily as needed (Dry eyes).       Contact information for follow-up providers    [name], Kathryn R, PA-C Follow up on 05/25/2020.   Specialties: Cardiology, Radiology Why: @ 4pm, please arrive at least 10 minutes early.  Contact information: [address] [place] [phone]  Cynthia Broker, MD Follow up in 1 week(s).   Specialty: Internal Medicine Contact information: [address] [place] [address] [phone]        Chilton Si, MD .   Specialty: Cardiology Contact information: [address] [place] [address] [phone]            Contact information for after-discharge care    Destination    [facility] Preferred SNF .   Service: Skilled Nursing Contact information: [address] [place] [address] [phone]                 Allergies  Allergen Reactions  . Morphine Other (See Comments)    'took me out of this world' I had to be resuscitated  . Codeine Sulfate Other (See Comments)    GI upset and pain  . Demerol [Meperidine] Nausea And Vomiting  . Meperidine Hcl Nausea And Vomiting and Swelling  . Propoxyphene Hcl Other (See Comments)    Darvocet caused sick headache     Consultations: Cardiology and cardiothoracic surgery   Procedures/Studies: DG Orthopantogram  Result Date: 05/11/2020 CLINICAL DATA:  Poor dentition. EXAM: ORTHOPANTOGRAM/PANORAMIC COMPARISON:  None. FINDINGS: There are multiple missing teeth with caries involving 3 of the residual 7 mandibular teeth. Metallic restorations are present in 3 of the other teeth. No periapical lucency. Bone of the mandible is otherwise normal. Maxilla is normal. Maxillary sinuses are clear. IMPRESSION: Multiple missing teeth with caries involving 3 of the residual 7 mandibular teeth high. Electronically Signed   By: Francene Boyers M.D.   On: 05/11/2020 16:38   DG Chest 1 View  Result Date: 05/10/2020 CLINICAL DATA:  74 year old female chest pain. EXAM: CHEST  1 VIEW COMPARISON:  Chest radiograph dated 05/05/2020. FINDINGS: No focal consolidation, pleural effusion, pneumothorax. The cardiac silhouette is within normal limits. Coronary vascular calcification. No acute osseous pathology. IMPRESSION: No focal consolidation. Electronically Signed   By: Elgie Collard M.D.   On: 05/10/2020 23:54   DG Chest 2 View  Result Date: 05/16/2020 CLINICAL DATA:  Preoperative examination in patient for aortic valve replacement. EXAM: CHEST - 2 VIEW COMPARISON:  Single-view of the chest 05/10/2020. CT chest 05/11/2020. FINDINGS: Lungs clear. Heart size normal. No pneumothorax or pleural fluid. No acute or focal bony abnormality. IMPRESSION: No acute disease. Electronically Signed   By: Drusilla Kanner M.D.   On: 05/16/2020 18:53   DG Chest 2 View  Result Date: 05/05/2020 CLINICAL DATA:  Chest tightness, short of breath EXAM: CHEST - 2 VIEW COMPARISON:  12/19/2014 FINDINGS: The heart size and mediastinal contours are within normal limits. Both lungs are clear. The visualized skeletal structures are unremarkable. IMPRESSION: No active cardiopulmonary disease. Electronically Signed   By: Jasmine Pang M.D.   On: 05/05/2020 15:26    CT Head Wo Contrast  Result Date: 05/10/2020 CLINICAL DATA:  Recent fall EXAM: CT HEAD WITHOUT CONTRAST CT CERVICAL SPINE WITHOUT CONTRAST TECHNIQUE: Multidetector CT imaging of the head and cervical spine was performed following the standard protocol without intravenous contrast. Multiplanar CT image reconstructions of the cervical spine were also generated. COMPARISON:  12/19/2014 FINDINGS: CT HEAD FINDINGS Brain: Mild atrophic changes and chronic white matter ischemic change is seen. No acute hemorrhage, acute infarction or space-occupying mass lesion are noted. Vascular: No hyperdense vessel or unexpected calcification. Skull: Normal. Negative for fracture or focal lesion. Sinuses/Orbits: No acute finding. Other: None. CT CERVICAL SPINE FINDINGS Alignment: Within normal limits. Skull base and  vertebrae: 7 cervical segments are well visualized. Vertebral body height is well maintained. Disc space narrowing is noted at C5-6 and C6-7 associated osteophytic changes. Mild facet hypertrophic changes are seen as well at multiple levels. No acute fracture or acute facet abnormality is noted. Soft tissues and spinal canal: Scattered small hypodensities within the thyroid are noted. These are less than 1 cm in size. Remaining soft tissue structures are within normal limits. Upper chest: Visualized lung apices are within normal limits. Other: None IMPRESSION: CT of the head: Chronic atrophic and ischemic changes without acute abnormality. CT of the cervical spine: Multilevel degenerative change without acute abnormality. Scattered small hypodensities within the thyroid. No followup recommended (ref: J Am Coll Radiol. 2015 Feb;12(2): 143-50). Electronically Signed   By: Alcide Clever M.D.   On: 05/10/2020 23:53   CT Angio Chest PE W/Cm &/Or Wo Cm  Result Date: 05/11/2020 CLINICAL DATA:  Shortness of breath and hypoxia EXAM: CT ANGIOGRAPHY CHEST WITH CONTRAST TECHNIQUE: Multidetector CT imaging of the chest was  performed using the standard protocol during bolus administration of intravenous contrast. Multiplanar CT image reconstructions and MIPs were obtained to evaluate the vascular anatomy. CONTRAST:  75mL OMNIPAQUE IOHEXOL 350 MG/ML SOLN COMPARISON:  None. FINDINGS: Cardiovascular: There is a optimal opacification of the pulmonary arteries. There is no central,segmental, or subsegmental filling defects within the pulmonary arteries. The heart is normal in size. No pericardial effusion or thickening. No evidence right heart strain. There is normal three-vessel brachiocephalic anatomy without proximal stenosis. Scattered aortic atherosclerotic calcifications are seen without aneurysmal dilatation. Aortic valve and coronary artery calcifications are seen. Mediastinum/Nodes: No hilar, mediastinal, or axillary adenopathy. There is a partially calcified subcarinal lymph node present. Thyroid gland, trachea, and esophagus demonstrate no significant findings. Lungs/Pleura: Mild bibasilar dependent atelectasis noted. No large airspace consolidation. No pleural effusion or pneumothorax. No airspace consolidation. Upper Abdomen: No acute abnormalities present in the visualized portions of the upper abdomen. Musculoskeletal: No chest wall abnormality. No acute or significant osseous findings. Review of the MIP images confirms the above findings. IMPRESSION: No central, segmental, or subsegmental pulmonary embolism. No other acute intrathoracic pathology to explain the patient's symptoms. Aortic Atherosclerosis (ICD10-I70.0). Electronically Signed   By: Jonna Clark M.D.   On: 05/11/2020 01:57   CT Cervical Spine Wo Contrast  Result Date: 05/10/2020 CLINICAL DATA:  Recent fall EXAM: CT HEAD WITHOUT CONTRAST CT CERVICAL SPINE WITHOUT CONTRAST TECHNIQUE: Multidetector CT imaging of the head and cervical spine was performed following the standard protocol without intravenous contrast. Multiplanar CT image reconstructions of the  cervical spine were also generated. COMPARISON:  12/19/2014 FINDINGS: CT HEAD FINDINGS Brain: Mild atrophic changes and chronic white matter ischemic change is seen. No acute hemorrhage, acute infarction or space-occupying mass lesion are noted. Vascular: No hyperdense vessel or unexpected calcification. Skull: Normal. Negative for fracture or focal lesion. Sinuses/Orbits: No acute finding. Other: None. CT CERVICAL SPINE FINDINGS Alignment: Within normal limits. Skull base and vertebrae: 7 cervical segments are well visualized. Vertebral body height is well maintained. Disc space narrowing is noted at C5-6 and C6-7 associated osteophytic changes. Mild facet hypertrophic changes are seen as well at multiple levels. No acute fracture or acute facet abnormality is noted. Soft tissues and spinal canal: Scattered small hypodensities within the thyroid are noted. These are less than 1 cm in size. Remaining soft tissue structures are within normal limits. Upper chest: Visualized lung apices are within normal limits. Other: None IMPRESSION: CT of the head: Chronic atrophic  and ischemic changes without acute abnormality. CT of the cervical spine: Multilevel degenerative change without acute abnormality. Scattered small hypodensities within the thyroid. No followup recommended (ref: J Am Coll Radiol. 2015 Feb;12(2): 143-50). Electronically Signed   By: Alcide Clever M.D.   On: 05/10/2020 23:53   CARDIAC CATHETERIZATION  Result Date: 05/09/2020  1st Mrg lesion is 25% stenosed.  2nd Mrg lesion is 25% stenosed.  The left ventricular systolic function is normal.  LV end diastolic pressure is normal.  The left ventricular ejection fraction is 55-65% by visual estimate.  There is severe aortic valve stenosis. Calculated aortic valve area 0.76 cm.  Aortic saturation 99%, PA saturation 68%, mean PA pressure 12 mmHg, mean PCWP 6 mmHg. Cardiac output 4.0 L/min. Cardiac index 2.16.  Nonobstructive coronary artery disease.   Severe aortic stenosis. We will forward to Dr. Clifton James to decide on plans for TAVR. I was unable to reach her sister, Stann Mainland.  No voicemail option was available.   CT CORONARY MORPH W/CTA COR W/SCORE W/CA W/CM &/OR WO/CM  Addendum Date: 05/12/2020   ADDENDUM REPORT: 05/12/2020 16:38 CLINICAL DATA:  Severe Aortic Stenosis. EXAM: Cardiac TAVR CT TECHNIQUE: The patient was scanned on a [employer]. A 120 kV retrospective scan was triggered in the descending thoracic aorta at 111 HU's. Gantry rotation speed was 250 msecs and collimation was .6 mm. 10 mg IV metoprolol was given. No nitroglycerin was given. The 3D data set was reconstructed in 5% intervals of the R-R cycle. Systolic and diastolic phases were analyzed on a dedicated work station using MPR, MIP and VRT modes. The patient received 80 cc of contrast. FINDINGS: Image quality: Excellent. Noise artifact is: Limited. Valve Morphology: The aortic valve is tricuspid. There leaflets are severely calcified with restricted leaflet motion in systole. The RCC contains bulky calcifications extending from the leaflet tip to the base of the leaflet. Aortic Valve Calcium score: 1733 Aortic annular dimension: Phase assessed: 25% Annular area: 421 mm2 Annular perimeter: 74.2 mm Max diameter: 26.0 mm Min diameter: 21.2 mm Annular and subannular calcification: No significant annular or subannular calcifications. Optimal coplanar projection: RAO 5, CRA 3 Coronary Artery Height above Annulus: Left Main: 12.9 mm Right Coronary: 15.4 mm Sinus of Valsalva Measurements: Non-coronary: 27 mm Right-coronary: 27 mm Left-coronary: 27 mm Sinus of Valsalva Height: Non-coronary: 19.0 mm Right-coronary: 19.8 mm Left-coronary: 19.1 mm Sinotubular Junction: 25 mm.  Mild calcified plaque. Ascending Thoracic Aorta: 32 mm.  Minimal calcified plaque. Coronary Arteries: Normal coronary origin. Right dominance. The study was performed without use of NTG and is insufficient for  plaque evaluation. Please refer to recent cardiac catheterization for coronary assessment. Cardiac Morphology: Right Atrium: Right atrial size is within normal limits. Right Ventricle: The right ventricular cavity is within normal limits. Left Atrium: Left atrial size is normal in size with no left atrial appendage filling defect. Left Ventricle: The ventricular cavity size is within normal limits. There are no stigmata of prior infarction. There is no abnormal filling defect. Hyperdynamic LV function, LVEF=79%. No regional wall motion abnormalities. Pulmonary arteries: Normal in size without proximal filling defect. Pulmonary veins: Normal pulmonary venous drainage. Pericardium: Normal thickness with no significant effusion or calcium present. Mitral Valve: The mitral valve is normal structure with mild mitral annular calcification. Extra-cardiac findings: See attached radiology report for non-cardiac structures. IMPRESSION: 1. Annular measurements appropriate for 23 mm Sapien 3 TAVR (421 mm2). 2. No significant annular or subannular calcifications. 3. Sufficient coronary to annulus distance. 4.  Optimal Fluoroscopic Angle for Delivery: RAO 5 CRA 3 Ada T. Flora Lipps, MD Electronically Signed   By: Lennie Odor   On: 05/12/2020 16:38   Result Date: 05/12/2020 EXAM: OVER-READ INTERPRETATION  CT CHEST The following report is an over-read performed by radiologist Dr. Trudie Reed of [facility] on 05/12/2020. This over-read does not include interpretation of cardiac or coronary anatomy or pathology. The coronary calcium score/coronary CTA interpretation by the cardiologist is attached. COMPARISON:  Chest CTA 05/11/2020. FINDINGS: Extracardiac findings will be described separately under dictation for contemporaneously obtained CTA chest, abdomen and pelvis. IMPRESSION: Please see separate dictation for contemporaneously obtained CTA chest, abdomen and pelvis dated 05/12/2020 for full description of  relevant extracardiac findings. Electronically Signed: By: Trudie Reed M.D. On: 05/12/2020 15:53   DG Knee Complete 4 Views Left  Result Date: 05/11/2020 CLINICAL DATA:  Fall, knee pain EXAM: LEFT KNEE - COMPLETE 4+ VIEW COMPARISON:  None. FINDINGS: Advanced tricompartment degenerative changes with joint space narrowing and spurring. Trace associated joint effusion. No acute bony abnormality. Specifically, no fracture, subluxation, or dislocation. IMPRESSION: Advanced degenerative changes and trace joint effusion. No acute bony abnormality. Electronically Signed   By: Charlett Nose M.D.   On: 05/11/2020 00:01   CT ANGIO CHEST AORTA W/CM & OR WO/CM  Result Date: 05/12/2020 CLINICAL DATA:  74 year old female with history of severe aortic stenosis. Preprocedural study prior to potential transcatheter aortic valve replacement (TAVR) procedure. EXAM: CT ANGIOGRAPHY CHEST, ABDOMEN AND PELVIS TECHNIQUE: Chest CTA 05/11/2020.  CT the abdomen and pelvis 03/27/2014. Multidetector CT imaging through the chest, abdomen and pelvis was performed using the standard protocol during bolus administration of intravenous contrast. Multiplanar reconstructed images and MIPs were obtained and reviewed to evaluate the vascular anatomy. CONTRAST:  OMNIPAQUE IOHEXOL 350 MG/ML SOLN COMPARISON:  None. FINDINGS: CTA CHEST FINDINGS Cardiovascular: Heart size is normal. There is no significant pericardial fluid, thickening or pericardial calcification. There is aortic atherosclerosis, as well as atherosclerosis of the great vessels of the mediastinum and the coronary arteries, including calcified atherosclerotic plaque in the left main, left anterior descending, left circumflex and right coronary arteries. Thickening calcification of the aortic valve. Calcifications of the mitral annulus. Mediastinum/Lymph Nodes: No pathologically enlarged mediastinal or hilar lymph nodes. Esophagus is unremarkable in appearance. No axillary  lymphadenopathy. Lungs/Pleura: No suspicious appearing pulmonary nodules or masses are noted. No acute consolidative airspace disease. No pleural effusions. Musculoskeletal/Soft Tissues: There are no aggressive appearing lytic or blastic lesions noted in the visualized portions of the skeleton. CTA ABDOMEN AND PELVIS FINDINGS Hepatobiliary: No suspicious cystic or solid hepatic lesions. No intra or extrahepatic biliary ductal dilatation. Status post cholecystectomy. Pancreas: No pancreatic mass. No pancreatic ductal dilatation. No pancreatic or peripancreatic fluid collections or inflammatory changes. Spleen: Unremarkable. Adrenals/Urinary Tract: Bilateral kidneys and adrenal glands are normal in appearance. No hydroureteronephrosis. Urinary bladder is normal in appearance. Stomach/Bowel: Normal appearance of the stomach. No pathologic dilatation of small bowel or colon. Numerous colonic diverticulae are noted, most severe in the descending colon and sigmoid colon, without surrounding inflammatory changes to suggest an acute diverticulitis at this time. Normal appendix. Vascular/Lymphatic: Aortic atherosclerosis, without evidence of aneurysm or dissection in the abdominal or pelvic vasculature. Vascular findings and measurements pertinent to potential TAVR procedure, as detailed below. No lymphadenopathy noted in the abdomen or pelvis. Reproductive: Status post hysterectomy. Ovaries are not confidently identified and may be surgically absent or atrophic. Other: Large umbilical hernia containing only omental fat. No significant volume  of ascites. No pneumoperitoneum. Musculoskeletal: Chronic appearing compression fracture at L1 with 25% loss of anterior vertebral body height. There are no aggressive appearing lytic or blastic lesions noted in the visualized portions of the skeleton. VASCULAR MEASUREMENTS PERTINENT TO TAVR: AORTA: Minimal Aortic Diameter-12 x 12 mm Severity of Aortic Calcification-severe RIGHT PELVIS:  Right Common Iliac Artery - Minimal Diameter-10.3 x 9.1 mm Tortuosity - mild Calcification-mild Right External Iliac Artery - Minimal Diameter-6.5 x 6.7 mm Tortuosity - mild Calcification-none Right Common Femoral Artery - Minimal Diameter-7.6 x 7.1 mm Tortuosity - mild Calcification-none LEFT PELVIS: Left Common Iliac Artery - Minimal Diameter-10.1 x 9.9 mm Tortuosity - mild Calcification-mild Left External Iliac Artery - Minimal Diameter-7.2 x 6.6 mm Tortuosity - mild Calcification-none Left Common Femoral Artery - Minimal Diameter-7.3 x 6.9 mm Tortuosity-mild Calcification-mild Review of the MIP images confirms the above findings. IMPRESSION: 1. Vascular findings and measurements pertinent to potential TAVR procedure, as detailed above. 2. Severe thickening calcification of the aortic valve, compatible with reported clinical history of severe aortic stenosis. 3. Aortic atherosclerosis, in addition to left main and 3 vessel coronary artery disease. 4. Severe colonic diverticulosis without evidence of acute diverticulitis at this time. 5. Additional incidental findings, as above. Electronically Signed   By: Trudie Reed M.D.   On: 05/12/2020 17:00   ECHOCARDIOGRAM COMPLETE  Result Date: 05/18/2020    ECHOCARDIOGRAM REPORT   Patient Name:   [name] Date of Exam: 05/18/2020 Medical Rec #:  [id number]             Height:       64.0 in Accession #:    [id number]            Weight:       178.6 lb Date of Birth:  Nov 02, 1946             BSA:          1.864 m Patient Age:    74 years              BP:           95/72 mmHg Patient Gender: F                     HR:           90 bpm. Exam Location:  Inpatient Procedure: 2D Echo, Cardiac Doppler, Color Doppler and Intracardiac            Opacification Agent Indications:    Post TAVR Evaluation. V43.3 / Z95.2  History:        Patient has prior history of Echocardiogram examinations, most                 recent 05/17/2020. Aortic Valve Disease; Risk                  Factors:Hypertension, Diabetes and Dyslipidemia. GERD.                 Aortic Valve: 23 mm Edwards Sapien prosthetic, stented (TAVR)                 valve is present in the aortic position. Procedure Date:                 05/17/2020.  Sonographer:    Tiffany Dance Referring Phys: [id number] MICHAEL COOPER IMPRESSIONS  1. 23 mm S3. V max 2.9 m/s, mean gardient 20 mmHG, EOA 1.72 cm2, DI 0.50. Vmax/gradient slightly higher due  to high flow (LVOT VTI 26 cm). Normally functioning prosthetic TAVR without evidence of paravalvular leak. The aortic valve has been repaired/replaced. Aortic valve regurgitation is not visualized. There is a 23 mm Edwards Sapien prosthetic (TAVR) valve present in the aortic position. Procedure Date: 05/17/2020.  2. Left ventricular ejection fraction, by estimation, is 60 to 65%. The left ventricle has normal function. The left ventricle has no regional wall motion abnormalities. Left ventricular diastolic parameters are consistent with Grade II diastolic dysfunction (pseudonormalization). Elevated left atrial pressure.  3. Right ventricular systolic function is normal. The right ventricular size is normal. There is normal pulmonary artery systolic pressure. The estimated right ventricular systolic pressure is 29.8 mmHg.  4. The mitral valve is grossly normal. Trivial mitral valve regurgitation. No evidence of mitral stenosis.  5. The inferior vena cava is normal in size with greater than 50% respiratory variability, suggesting right atrial pressure of 3 mmHg. Comparison(s): Changes from prior study are noted. FINDINGS  Left Ventricle: Left ventricular ejection fraction, by estimation, is 60 to 65%. The left ventricle has normal function. The left ventricle has no regional wall motion abnormalities. Definity contrast agent was given IV to delineate the left ventricular  endocardial borders. The left ventricular internal cavity size was normal in size. There is no left ventricular hypertrophy. Left  ventricular diastolic parameters are consistent with Grade II diastolic dysfunction (pseudonormalization). Elevated left atrial pressure. Right Ventricle: The right ventricular size is normal. No increase in right ventricular wall thickness. Right ventricular systolic function is normal. There is normal pulmonary artery systolic pressure. The tricuspid regurgitant velocity is 2.59 m/s, and  with an assumed right atrial pressure of 3 mmHg, the estimated right ventricular systolic pressure is 29.8 mmHg. Left Atrium: Left atrial size was normal in size. Right Atrium: Right atrial size was normal in size. Pericardium: There is no evidence of pericardial effusion. Mitral Valve: The mitral valve is grossly normal. Mild mitral annular calcification. Trivial mitral valve regurgitation. No evidence of mitral valve stenosis. Tricuspid Valve: The tricuspid valve is grossly normal. Tricuspid valve regurgitation is mild . No evidence of tricuspid stenosis. Aortic Valve: 23 mm S3. V max 2.9 m/s, mean gardient 20 mmHG, EOA 1.72 cm2, DI 0.50. Vmax/gradient slightly higher due to high flow (LVOT VTI 26 cm). Normally functioning prosthetic TAVR without evidence of paravalvular leak. The aortic valve has been repaired/replaced. Aortic valve regurgitation is not visualized. Aortic valve mean gradient measures 20.0 mmHg. Aortic valve peak gradient measures 34.7 mmHg. Aortic valve area, by VTI measures 1.72 cm. There is a 23 mm Edwards Sapien prosthetic, stented (TAVR) valve present in the aortic position. Procedure Date: 05/17/2020. Pulmonic Valve: The pulmonic valve was grossly normal. Pulmonic valve regurgitation is not visualized. No evidence of pulmonic stenosis. Aorta: The aortic root and ascending aorta are structurally normal, with no evidence of dilitation. Venous: The inferior vena cava is normal in size with greater than 50% respiratory variability, suggesting right atrial pressure of 3 mmHg. IAS/Shunts: No atrial level shunt  detected by color flow Doppler.  LEFT VENTRICLE PLAX 2D LVIDd:         4.30 cm  Diastology LVIDs:         2.40 cm  LV e' lateral:   6.96 cm/s LV PW:         1.00 cm  LV E/e' lateral: 16.1 LV IVS:        1.00 cm  LV e' medial:    5.66 cm/s LVOT diam:  2.10 cm  LV E/e' medial:  19.8 LV SV:         91 LV SV Index:   49 LVOT Area:     3.46 cm  RIGHT VENTRICLE             IVC RV Basal diam:  2.70 cm     IVC diam: 1.90 cm RV S prime:     16.90 cm/s TAPSE (M-mode): 2.2 cm LEFT ATRIUM             Index       RIGHT ATRIUM           Index LA diam:        4.00 cm 2.15 cm/m  RA Area:     14.70 cm LA Vol (A2C):   67.6 ml 36.26 ml/m RA Volume:   37.90 ml  20.33 ml/m LA Vol (A4C):   45.2 ml 24.24 ml/m LA Biplane Vol: 56.1 ml 30.09 ml/m  AORTIC VALVE AV Area (Vmax):    1.42 cm AV Area (Vmean):   1.38 cm AV Area (VTI):     1.72 cm AV Vmax:           294.50 cm/s AV Vmean:          203.500 cm/s AV VTI:            0.533 m AV Peak Grad:      34.7 mmHg AV Mean Grad:      20.0 mmHg LVOT Vmax:         121.00 cm/s LVOT Vmean:        80.900 cm/s LVOT VTI:          0.264 m LVOT/AV VTI ratio: 0.50  AORTA Ao Root diam: 2.80 cm Ao Asc diam:  3.20 cm MITRAL VALVE                TRICUSPID VALVE MV Area (PHT): 2.91 cm     TR Peak grad:   26.8 mmHg MV Decel Time: 261 msec     TR Vmax:        259.00 cm/s MV E velocity: 112.00 cm/s MV A velocity: 94.10 cm/s   SHUNTS MV E/A ratio:  1.19         Systemic VTI:  0.26 m                             Systemic Diam: 2.10 cm Lennie Odor MD Electronically signed by Lennie Odor MD Signature Date/Time: 05/18/2020/2:16:59 PM    Final    ECHOCARDIOGRAM COMPLETE  Result Date: 05/06/2020    ECHOCARDIOGRAM REPORT   Patient Name:   [name] Date of Exam: 05/06/2020 Medical Rec #:  [id number]             Height:       64.0 in Accession #:    [id number]            Weight:       188.3 lb Date of Birth:  [birth year]             BSA:          1.907 m Patient Age:    74 years              BP:            97/50 mmHg Patient Gender: F  HR:           80 bpm. Exam Location:  Inpatient Procedure: 2D Echo, Color Doppler, Cardiac Doppler and Intracardiac            Opacification Agent Indications:    NSTEMI  History:        Patient has prior history of Echocardiogram examinations, most                 recent 10/08/2012. NSTEMI; Risk Factors:Hypertension, Diabetes                 and Dyslipidemia.  Sonographer:    Irving Burton Senior RDCS Referring Phys: [id number] Corrin Parker  Sonographer Comments: Technically difficult due to poor echo windows and reduced skin integrity. IMPRESSIONS  1. Left ventricular ejection fraction, by estimation, is 55 to 60%. The left ventricle has normal function. The left ventricle has no regional wall motion abnormalities. There is mild concentric left ventricular hypertrophy. Left ventricular diastolic function could not be evaluated.  2. Right ventricular systolic function is normal. The right ventricular size is normal. Tricuspid regurgitation signal is inadequate for assessing PA pressure.  3. The mitral valve is grossly normal. No evidence of mitral valve regurgitation. No evidence of mitral stenosis.  4. Unable to determine valve morphology due to image quality. Aortic valve regurgitation is not visualized. Moderate aortic valve stenosis. Aortic valve area, by VTI measures 0.74 cm. Aortic valve mean gradient measures 32.0 mmHg. Aortic valve Vmax measures 3.47 m/s.  5. The inferior vena cava is normal in size with greater than 50% respiratory variability, suggesting right atrial pressure of 3 mmHg. Comparison(s): Changes from prior study are noted. Aortic stenosis is now moderate. LVEF is normal. FINDINGS  Left Ventricle: Left ventricular ejection fraction, by estimation, is 55 to 60%. The left ventricle has normal function. The left ventricle has no regional wall motion abnormalities. Definity contrast agent was given IV to delineate the left ventricular   endocardial borders. The left ventricular internal cavity size was normal in size. There is mild concentric left ventricular hypertrophy. Left ventricular diastolic function could not be evaluated due to nondiagnostic images. Left ventricular diastolic function could not be evaluated. Right Ventricle: The right ventricular size is normal. No increase in right ventricular wall thickness. Right ventricular systolic function is normal. Tricuspid regurgitation signal is inadequate for assessing PA pressure. Left Atrium: Left atrial size was normal in size. Right Atrium: Right atrial size was normal in size. Pericardium: Trivial pericardial effusion is present. Presence of pericardial fat pad. Mitral Valve: The mitral valve is grossly normal. Mild mitral annular calcification. No evidence of mitral valve regurgitation. No evidence of mitral valve stenosis. Tricuspid Valve: The tricuspid valve is grossly normal. Tricuspid valve regurgitation is not demonstrated. No evidence of tricuspid stenosis. Aortic Valve: Unable to determine valve morphology due to image quality.. There is moderate thickening and moderate calcification of the aortic valve. Aortic valve regurgitation is not visualized. Moderate aortic stenosis is present. There is moderate thickening of the aortic valve. There is moderate calcification of the aortic valve. Aortic valve mean gradient measures 32.0 mmHg. Aortic valve peak gradient measures 48.2 mmHg. Aortic valve area, by VTI measures 0.74 cm. Pulmonic Valve: The pulmonic valve was grossly normal. Pulmonic valve regurgitation is not visualized. No evidence of pulmonic stenosis. Aorta: The aortic root and ascending aorta are structurally normal, with no evidence of dilitation. Venous: The inferior vena cava is normal in size with greater than 50% respiratory variability, suggesting right atrial  pressure of 3 mmHg. IAS/Shunts: The atrial septum is grossly normal.  LEFT VENTRICLE PLAX 2D LVIDd:          3.80 cm  Diastology LVIDs:         2.30 cm  LV e' lateral:   8.38 cm/s LV PW:         1.00 cm  LV E/e' lateral: 10.6 LV IVS:        1.20 cm  LV e' medial:    4.79 cm/s LVOT diam:     1.90 cm  LV E/e' medial:  18.6 LV SV:         51 LV SV Index:   27 LVOT Area:     2.84 cm  RIGHT VENTRICLE RV S prime:     9.57 cm/s TAPSE (M-mode): 2.0 cm LEFT ATRIUM             Index       RIGHT ATRIUM           Index LA diam:        3.60 cm 1.89 cm/m  RA Area:     11.00 cm LA Vol (A2C):   62.9 ml 32.99 ml/m RA Volume:   24.50 ml  12.85 ml/m LA Vol (A4C):   52.5 ml 27.53 ml/m LA Biplane Vol: 58.8 ml 30.84 ml/m  AORTIC VALVE AV Area (Vmax):    0.62 cm AV Area (Vmean):   0.59 cm AV Area (VTI):     0.74 cm AV Vmax:           347.00 cm/s AV Vmean:          264.000 cm/s AV VTI:            0.690 m AV Peak Grad:      48.2 mmHg AV Mean Grad:      32.0 mmHg LVOT Vmax:         75.40 cm/s LVOT Vmean:        55.300 cm/s LVOT VTI:          0.179 m LVOT/AV VTI ratio: 0.26  AORTA Ao Root diam: 2.60 cm Ao Asc diam:  3.00 cm MITRAL VALVE MV Area (PHT): 3.19 cm    SHUNTS MV Decel Time: 238 msec    Systemic VTI:  0.18 m MV E velocity: 89.10 cm/s  Systemic Diam: 1.90 cm MV A velocity: 90.40 cm/s MV E/A ratio:  0.99 Lennie Odor MD Electronically signed by Lennie Odor MD Signature Date/Time: 05/06/2020/3:10:41 PM    Final    DG Hip Unilat W or Wo Pelvis 2-3 Views Left  Result Date: 05/10/2020 CLINICAL DATA:  Hip pain, fall EXAM: DG HIP (WITH OR WITHOUT PELVIS) 2-3V LEFT COMPARISON:  None. FINDINGS: Hip joints and SI joints are symmetric and unremarkable. No acute bony abnormality. Specifically, no fracture, subluxation, or dislocation. IMPRESSION: No acute bony abnormality. Electronically Signed   By: Charlett Nose M.D.   On: 05/10/2020 23:58   VAS US CAROTID  Result Date: 05/12/2020 Carotid Arterial Duplex Study Indications:       Pre-TAVR evaluation. Risk Factors:      Hypertension, hyperlipidemia, Diabetes. Other Factors:     Aortic  stenosis. Comparison Study:  12/21/2014- carotid artery duplex Performing Technologist: Gertie Fey MHA, RDMS, RVT, RDCS  Examination Guidelines: A complete evaluation includes B-mode imaging, spectral Doppler, color Doppler, and power Doppler as needed of all accessible portions of each vessel. Bilateral testing is considered an integral part of a complete examination.  Limited examinations for reoccurring indications may be performed as noted.  Right Carotid Findings: +----------+-------+-------+--------+---------------------------------+--------+           PSV    EDV    StenosisPlaque Description               Comments           cm/s   cm/s                                                     +----------+-------+-------+--------+---------------------------------+--------+ CCA Prox  76     10                                                       +----------+-------+-------+--------+---------------------------------+--------+ CCA Distal91     14             heterogenous, irregular and                                               calcific                                  +----------+-------+-------+--------+---------------------------------+--------+ ICA Prox  102    18             heterogenous, irregular and                                               calcific                                  +----------+-------+-------+--------+---------------------------------+--------+ ICA Distal104    23                                                       +----------+-------+-------+--------+---------------------------------+--------+ ECA       163                   heterogenous, irregular and                                               calcific                                  +----------+-------+-------+--------+---------------------------------+--------+ +----------+--------+-------+----------------+-------------------+           PSV cm/sEDV  cmsDescribe        Arm Pressure (mmHG) +----------+--------+-------+----------------+-------------------+ [id number]            Multiphasic, WNL                    +----------+--------+-------+----------------+-------------------+ +---------+--------+--+--------+-+---------+  VertebralPSV cm/s44EDV cm/s7Antegrade +---------+--------+--+--------+-+---------+  Left Carotid Findings: +----------+--------+--------+--------+----------------------------+--------+           PSV cm/sEDV cm/sStenosisPlaque Description          Comments +----------+--------+--------+--------+----------------------------+--------+ CCA Prox  131     8                                                    +----------+--------+--------+--------+----------------------------+--------+ CCA Distal140     15              hyperechoic and heterogenous         +----------+--------+--------+--------+----------------------------+--------+ ICA Prox  108     19                                                   +----------+--------+--------+--------+----------------------------+--------+ ICA Distal93      21                                                   +----------+--------+--------+--------+----------------------------+--------+ +----------+--------+--------+----------------+-------------------+           PSV cm/sEDV cm/sDescribe        Arm Pressure (mmHG) +----------+--------+--------+----------------+-------------------+ [id number]             Multiphasic, WNL                    +----------+--------+--------+----------------+-------------------+ +---------+--------+--+--------+-+---------+ VertebralPSV cm/s40EDV cm/s7Antegrade +---------+--------+--+--------+-+---------+   Summary: Right Carotid: Velocities in the right ICA are consistent with a 1-39% stenosis. Left Carotid: Velocities in the left ICA are consistent with a 1-39% stenosis. Vertebrals:  Bilateral vertebral arteries  demonstrate antegrade flow. Subclavians: Normal flow hemodynamics were seen in bilateral subclavian              arteries. *See table(s) above for measurements and observations.  Electronically signed by Ruta Hinds MD on 05/12/2020 at 7:40:46 PM.    Final    ECHOCARDIOGRAM LIMITED  Result Date: 05/17/2020    ECHOCARDIOGRAM LIMITED REPORT   Patient Name:   [name] Date of Exam: 05/17/2020 Medical Rec #:  [id number]             Height:       64.0 in Accession #:    [id number]            Weight:       178.6 lb Date of Birth:  07-30-1946             BSA:          1.864 m Patient Age:    89 years              BP:           120/63 mmHg Patient Gender: F                     HR:           66 bpm. Exam Location:  Inpatient Procedure: Limited Echo, Color Doppler and Cardiac Doppler Indications:    Aortic Stenosis  History:  Patient has prior history of Echocardiogram examinations, most                 recent 05/06/2020. Risk Factors:Hypertension, Dyslipidemia and                 Diabetes. GERD.                 Aortic Valve: 23 mm Edwards Sapien prosthetic, stented (TAVR)                 valve is present in the aortic position. Procedure Date:                 05/17/2020.  Sonographer:    Leta Jungling RDCS Sonographer#2:  Leta Jungling RDCS Referring Phys: [id number] Wille Celeste THOMPSON IMPRESSIONS  1. Echo guided TAVR procedure. Preprocedure Vmax 3.3 m/s, mean gradient 26 mmHG, AVA 0.73 cm2 consistent with severe aortic stenosis. After 23 mm S3 placement, V max 1.5 m/s, mean gradient 6 mmHG, EOA 1.90 cm2, DI 0.61. No paravalvular leak noted.  2. Successful placement of a 23 mm Sapien 3 TAVR. V max 1.5 m/s, mean gradient 6 mmHG, EOA 1.90 cm2, DI 0.61. No paravalvular leak noted. The aortic valve has been repaired/replaced. There is a 23 mm Edwards Sapien prosthetic (TAVR) valve present in the  aortic position. Procedure Date: 05/17/2020.  3. Left ventricular ejection fraction, by estimation, is 60 to 65%.  The left ventricle has normal function. FINDINGS  Left Ventricle: Left ventricular ejection fraction, by estimation, is 60 to 65%. The left ventricle has normal function. Pericardium: There is no evidence of pericardial effusion. Aortic Valve: Successful placement of a 23 mm Sapien 3 TAVR. V max 1.5 m/s, mean gradient 6 mmHG, EOA 1.90 cm2, DI 0.61. No paravalvular leak noted. The aortic valve has been repaired/replaced. Aortic valve mean gradient measures 6.0 mmHg. Aortic valve peak gradient measures 9.0 mmHg. Aortic valve area, by VTI measures 1.82 cm. There is a 23 mm Edwards Sapien prosthetic, stented (TAVR) valve present in the aortic position. Procedure Date: 05/17/2020.  LEFT VENTRICLE PLAX 2D LVOT diam:     2.00 cm LV SV:         57 LV SV Index:   31 LVOT Area:     3.14 cm  AORTIC VALVE AV Area (Vmax):    1.92 cm AV Area (Vmean):   1.72 cm AV Area (VTI):     1.82 cm AV Vmax:           150.00 cm/s AV Vmean:          112.000 cm/s AV VTI:            0.314 m AV Peak Grad:      9.0 mmHg AV Mean Grad:      6.0 mmHg LVOT Vmax:         91.90 cm/s LVOT Vmean:        61.350 cm/s LVOT VTI:          0.182 m LVOT/AV VTI ratio: 0.58  SHUNTS Systemic VTI:  0.18 m Systemic Diam: 2.00 cm Lennie Odor MD Electronically signed by Lennie Odor MD Signature Date/Time: 05/17/2020/6:32:47 PM    Final    Structural Heart Procedure  Result Date: 05/17/2020 See surgical note for result.  CT Angio Abd/Pel w/ and/or w/o  Result Date: 05/12/2020 CLINICAL DATA:  74 year old female with history of severe aortic stenosis. Preprocedural study prior to potential transcatheter aortic valve replacement (TAVR) procedure. EXAM: CT ANGIOGRAPHY  CHEST, ABDOMEN AND PELVIS TECHNIQUE: Chest CTA 05/11/2020.  CT the abdomen and pelvis 03/27/2014. Multidetector CT imaging through the chest, abdomen and pelvis was performed using the standard protocol during bolus administration of intravenous contrast. Multiplanar reconstructed images and MIPs  were obtained and reviewed to evaluate the vascular anatomy. CONTRAST:  OMNIPAQUE IOHEXOL 350 MG/ML SOLN COMPARISON:  None. FINDINGS: CTA CHEST FINDINGS Cardiovascular: Heart size is normal. There is no significant pericardial fluid, thickening or pericardial calcification. There is aortic atherosclerosis, as well as atherosclerosis of the great vessels of the mediastinum and the coronary arteries, including calcified atherosclerotic plaque in the left main, left anterior descending, left circumflex and right coronary arteries. Thickening calcification of the aortic valve. Calcifications of the mitral annulus. Mediastinum/Lymph Nodes: No pathologically enlarged mediastinal or hilar lymph nodes. Esophagus is unremarkable in appearance. No axillary lymphadenopathy. Lungs/Pleura: No suspicious appearing pulmonary nodules or masses are noted. No acute consolidative airspace disease. No pleural effusions. Musculoskeletal/Soft Tissues: There are no aggressive appearing lytic or blastic lesions noted in the visualized portions of the skeleton. CTA ABDOMEN AND PELVIS FINDINGS Hepatobiliary: No suspicious cystic or solid hepatic lesions. No intra or extrahepatic biliary ductal dilatation. Status post cholecystectomy. Pancreas: No pancreatic mass. No pancreatic ductal dilatation. No pancreatic or peripancreatic fluid collections or inflammatory changes. Spleen: Unremarkable. Adrenals/Urinary Tract: Bilateral kidneys and adrenal glands are normal in appearance. No hydroureteronephrosis. Urinary bladder is normal in appearance. Stomach/Bowel: Normal appearance of the stomach. No pathologic dilatation of small bowel or colon. Numerous colonic diverticulae are noted, most severe in the descending colon and sigmoid colon, without surrounding inflammatory changes to suggest an acute diverticulitis at this time. Normal appendix. Vascular/Lymphatic: Aortic atherosclerosis, without evidence of aneurysm or dissection in the  abdominal or pelvic vasculature. Vascular findings and measurements pertinent to potential TAVR procedure, as detailed below. No lymphadenopathy noted in the abdomen or pelvis. Reproductive: Status post hysterectomy. Ovaries are not confidently identified and may be surgically absent or atrophic. Other: Large umbilical hernia containing only omental fat. No significant volume of ascites. No pneumoperitoneum. Musculoskeletal: Chronic appearing compression fracture at L1 with 25% loss of anterior vertebral body height. There are no aggressive appearing lytic or blastic lesions noted in the visualized portions of the skeleton. VASCULAR MEASUREMENTS PERTINENT TO TAVR: AORTA: Minimal Aortic Diameter-12 x 12 mm Severity of Aortic Calcification-severe RIGHT PELVIS: Right Common Iliac Artery - Minimal Diameter-10.3 x 9.1 mm Tortuosity - mild Calcification-mild Right External Iliac Artery - Minimal Diameter-6.5 x 6.7 mm Tortuosity - mild Calcification-none Right Common Femoral Artery - Minimal Diameter-7.6 x 7.1 mm Tortuosity - mild Calcification-none LEFT PELVIS: Left Common Iliac Artery - Minimal Diameter-10.1 x 9.9 mm Tortuosity - mild Calcification-mild Left External Iliac Artery - Minimal Diameter-7.2 x 6.6 mm Tortuosity - mild Calcification-none Left Common Femoral Artery - Minimal Diameter-7.3 x 6.9 mm Tortuosity-mild Calcification-mild Review of the MIP images confirms the above findings. IMPRESSION: 1. Vascular findings and measurements pertinent to potential TAVR procedure, as detailed above. 2. Severe thickening calcification of the aortic valve, compatible with reported clinical history of severe aortic stenosis. 3. Aortic atherosclerosis, in addition to left main and 3 vessel coronary artery disease. 4. Severe colonic diverticulosis without evidence of acute diverticulitis at this time. 5. Additional incidental findings, as above. Electronically Signed   By: Trudie Reed M.D.   On: 05/12/2020 17:00      Discharge Exam: Vitals:   05/19/20 0444 05/19/20 0743  BP: (!) 117/55 137/62  Pulse: 63 72  Resp: 17 18  Temp: 97.6 F (36.4 C) 97.7 F (36.5 C)  SpO2: 95% 96%   Vitals:   05/18/20 2131 05/18/20 2349 05/19/20 0444 05/19/20 0743  BP: 135/66 (!) 152/55 (!) 117/55 137/62  Pulse: 80 78 63 72  Resp: 18 (!) 7 17 18   Temp: 98.1 F (36.7 C) 97.9 F (36.6 C) 97.6 F (36.4 C) 97.7 F (36.5 C)  TempSrc: Oral Oral Oral Oral  SpO2: 94% 98% 95% 96%  Weight:   83.2 kg   Height:        General: Pt is alert, awake, not in acute distress Cardiovascular: RRR, S1/S2 +, no rubs, no gallops.  3/6 systolic murmur Respiratory: CTA bilaterally, no wheezing, no rhonchi Abdominal: Soft, NT, ND, bowel sounds + Extremities: +1 pitting edema bilateral lower extremity, no cyanosis    The results of significant diagnostics from this hospitalization (including imaging, microbiology, ancillary and laboratory) are listed below for reference.     Microbiology: Recent Results (from the past 240 hour(s))  SARS Coronavirus 2 by RT PCR (hospital order, performed in [facility] hospital lab) Nasopharyngeal Nasopharyngeal Swab     Status: None   Collection Time: 05/11/20 12:49 AM   Specimen: Nasopharyngeal Swab  Result Value Ref Range Status   SARS Coronavirus 2 NEGATIVE NEGATIVE Final    Comment: (NOTE) SARS-CoV-2 target nucleic acids are NOT DETECTED. The SARS-CoV-2 RNA is generally detectable in upper and lower respiratory specimens during the acute phase of infection. The lowest concentration of SARS-CoV-2 viral copies this assay can detect is 250 copies / mL. A negative result does not preclude SARS-CoV-2 infection and should not be used as the sole basis for treatment or other patient management decisions.  A negative result may occur with improper specimen collection / handling, submission of specimen other than nasopharyngeal swab, presence of viral mutation(s) within the areas targeted  by this assay, and inadequate number of viral copies (<250 copies / mL). A negative result must be combined with clinical observations, patient history, and epidemiological information. Fact Sheet for Patients:   BoilerBrush.com.cy Fact Sheet for Healthcare Providers: https://pope.com/ This test is not yet approved or cleared  by the [place] FDA and has been authorized for detection and/or diagnosis of SARS-CoV-2 by FDA under an Emergency Use Authorization (EUA).  This EUA will remain in effect (meaning this test can be used) for the duration of the COVID-19 declaration under Section 564(b)(1) of the Act, 21 U.S.C. section 360bbb-3(b)(1), unless the authorization is terminated or revoked sooner. Performed at [facility], [address], [place], [place] [address]   Urine culture     Status: Abnormal   Collection Time: 05/11/20  2:20 AM   Specimen: Urine, Random  Result Value Ref Range Status   Specimen Description URINE, RANDOM  Final   Special Requests   Final    NONE Performed at [facility], [address], [place], [place] [address]    Culture MULTIPLE SPECIES PRESENT, SUGGEST RECOLLECTION (A)  Final   Report Status 05/11/2020 FINAL  Final     Labs: BNP (last 3 results) Recent Labs    05/05/20 1449 05/11/20 0058  BNP 215.2* 72.9   Basic Metabolic Panel: Recent Labs  Lab 05/13/20 0708 05/13/20 0708 05/14/20 0445 05/14/20 0445 05/17/20 0454 05/17/20 1424 05/17/20 1513 05/17/20 1611 05/18/20 0238  NA 138   < > 139   < > 138 138 138 138 133*  K 4.3   < > 4.8   < >  4.5 4.5 4.5 4.8 4.0  CL 98   < > 101   < > 103 100 99 100 101  CO2 30  --  29  --  29  --   --   --  22  GLUCOSE 95   < > 111*   < > 198* 151* 173* 188* 291*  BUN 18   < > 16   < > 19 20 19 19 16   CREATININE 0.70   < > 0.73   < > 0.81 0.60 0.50 0.60 0.65  CALCIUM 9.4  --  8.9  --  9.1  --   --   --  8.4*  MG  --   --   --   --   --    --   --   --  1.6*   < > = values in this interval not displayed.   Liver Function Tests: No results for input(s): AST, ALT, ALKPHOS, BILITOT, PROT, ALBUMIN in the last 168 hours. No results for input(s): LIPASE, AMYLASE in the last 168 hours. No results for input(s): AMMONIA in the last 168 hours. CBC: Recent Labs  Lab 05/13/20 0708 05/13/20 0708 05/14/20 0445 05/14/20 0445 05/17/20 0454 05/17/20 1424 05/17/20 1513 05/17/20 1611 05/18/20 0238  WBC 9.2  --  8.1  --  9.5  --   --   --  12.1*  HGB 14.2   < > 13.2   < > 13.7 13.6 12.9 12.2 14.0  HCT 44.9   < > 41.9   < > 43.0 40.0 38.0 36.0 42.5  MCV 86.5  --  85.9  --  85.3  --   --   --  83.7  PLT 265  --  239  --  271  --   --   --  212   < > = values in this interval not displayed.   Cardiac Enzymes: No results for input(s): CKTOTAL, CKMB, CKMBINDEX, TROPONINI in the last 168 hours. BNP: Invalid input(s): POCBNP CBG: Recent Labs  Lab 05/18/20 0601 05/18/20 1135 05/18/20 1705 05/18/20 2130 05/19/20 0618  GLUCAP 203* 217* 260* 259* 150*   D-Dimer No results for input(s): DDIMER in the last 72 hours. Hgb A1c No results for input(s): HGBA1C in the last 72 hours. Lipid Profile No results for input(s): CHOL, HDL, LDLCALC, TRIG, CHOLHDL, LDLDIRECT in the last 72 hours. Thyroid function studies No results for input(s): TSH, T4TOTAL, T3FREE, THYROIDAB in the last 72 hours.  Invalid input(s): FREET3 Anemia work up No results for input(s): VITAMINB12, FOLATE, FERRITIN, TIBC, IRON, RETICCTPCT in the last 72 hours. Urinalysis    Component Value Date/Time   COLORURINE YELLOW 05/17/2020 0410   APPEARANCEUR CLEAR 05/17/2020 0410   LABSPEC 1.011 05/17/2020 0410   PHURINE 6.0 05/17/2020 0410   GLUCOSEU 150 (A) 05/17/2020 0410   HGBUR SMALL (A) 05/17/2020 0410   BILIRUBINUR NEGATIVE 05/17/2020 0410   BILIRUBINUR Neg 05/10/2019 1029   KETONESUR NEGATIVE 05/17/2020 0410   PROTEINUR NEGATIVE 05/17/2020 0410   UROBILINOGEN  0.2 05/10/2019 1029   UROBILINOGEN 0.2 12/19/2014 1425   NITRITE NEGATIVE 05/17/2020 0410   LEUKOCYTESUR NEGATIVE 05/17/2020 0410   Sepsis Labs Invalid input(s): PROCALCITONIN,  WBC,  LACTICIDVEN Microbiology Recent Results (from the past 240 hour(s))  SARS Coronavirus 2 by RT PCR (hospital order, performed in [facility] hospital lab) Nasopharyngeal Nasopharyngeal Swab     Status: None   Collection Time: 05/11/20 12:49 AM   Specimen: Nasopharyngeal Swab  Result  Value Ref Range Status   SARS Coronavirus 2 NEGATIVE NEGATIVE Final    Comment: (NOTE) SARS-CoV-2 target nucleic acids are NOT DETECTED. The SARS-CoV-2 RNA is generally detectable in upper and lower respiratory specimens during the acute phase of infection. The lowest concentration of SARS-CoV-2 viral copies this assay can detect is 250 copies / mL. A negative result does not preclude SARS-CoV-2 infection and should not be used as the sole basis for treatment or other patient management decisions.  A negative result may occur with improper specimen collection / handling, submission of specimen other than nasopharyngeal swab, presence of viral mutation(s) within the areas targeted by this assay, and inadequate number of viral copies (<250 copies / mL). A negative result must be combined with clinical observations, patient history, and epidemiological information. Fact Sheet for Patients:   BoilerBrush.com.cy Fact Sheet for Healthcare Providers: https://pope.com/ This test is not yet approved or cleared  by the [place] FDA and has been authorized for detection and/or diagnosis of SARS-CoV-2 by FDA under an Emergency Use Authorization (EUA).  This EUA will remain in effect (meaning this test can be used) for the duration of the COVID-19 declaration under Section 564(b)(1) of the Act, 21 U.S.C. section 360bbb-3(b)(1), unless the authorization is terminated or revoked  sooner. Performed at [facility], [address] [place], [place] [address]   Urine culture     Status: Abnormal   Collection Time: 05/11/20  2:20 AM   Specimen: Urine, Random  Result Value Ref Range Status   Specimen Description URINE, RANDOM  Final   Special Requests   Final    NONE Performed at [facility], [address] [place], [place] [address]    Culture MULTIPLE SPECIES PRESENT, SUGGEST RECOLLECTION (A)  Final   Report Status 05/11/2020 FINAL  Final     Time coordinating discharge: Over 30 minutes  SIGNED:   Hughie Closs, MD  [facility] 05/19/2020, 9:05 AM  If 7PM-7AM, please contact night-coverage [email or web address]

## 2020-05-19 NOTE — Progress Notes (Signed)
Discharge instructions provided to patient and sister. All medications, follow up appointments, and discharge instructions provided. IV out. Monitor off CCMD notified. Patient discharging to home.  Ginette Otto, RN

## 2020-05-22 ENCOUNTER — Other Ambulatory Visit: Payer: Self-pay | Admitting: *Deleted

## 2020-05-22 ENCOUNTER — Telehealth: Payer: Self-pay

## 2020-05-22 NOTE — Patient Outreach (Signed)
[facility]) Care Management  05/22/2020  [name] 10/01/1946 [id number]   Discharged 6/11 Primary care office to completed transition of care Initial Outreach call 6/14  Telephone Assessment-Unsuccessful  RN attempted outreach call however unsuccessful. RN able to leave a HIPAA approved voice message requesting a call back. Will further engage at that time.  Plan: Will scheduled another outreach call over the next week and send outreach letter to this pt.  Elliot Cousin, RN Care Management Coordinator [facility] Main Office [phone]

## 2020-05-22 NOTE — Telephone Encounter (Signed)
Attempted TOC call.  Voicemail message left for pt to contact the office.

## 2020-05-23 ENCOUNTER — Other Ambulatory Visit: Payer: Self-pay | Admitting: *Deleted

## 2020-05-23 ENCOUNTER — Encounter: Payer: Self-pay | Admitting: *Deleted

## 2020-05-23 NOTE — Patient Outreach (Signed)
[facility]) Care Management  05/23/2020  [name] 07-01-1946 [id number]   EMMI-GENERAL DISCHARGE-RESOLVED RED ON EMMI ALERT Day #1 Date:05/21/2020 Red Alert Reason: NO FOLLOW UP APPOINTMENT  OUTREACH #2 RN spoke with pt concerning the above emmi. Pt reports this issues has been resolved with a pending appointment.  Plan: Pt will close this emmi however pt is pending active with this RN case manager at this time for ongoing [facility] services.  _________________  Transition of care completed by the provider office Referral received 6/1-readmitted and discharged on 05/19/2020 Outreach #2   Telephone Assessment-Successful-Enrolled Diabetes  RN spoke with pt and introduced the [facility] program and services. Verified identifiers and the purpose for today's call. Pt receptive as Therapist, sports further engaged on pt's needs related to her diabetes. Pt states some changes she has made with increasing her exercises by walking more inside the home. RN further discussed dietary habits related to low carbs and high proteins. Pt inquired on Mobile Meals or some type of meal delivery services that maybe able to assist. Offered a referral [facility] social worker for further assistance on available meals services. Discuss [employer] and pt is aware there maybe a waiting list with mobile meals.  Educated pt on her last A1c at 11.2 and possible barriers/fears on reducing this reading. Verified pt's fasting blood sugars around 150-160. Discussed goals to reduce by 1-2 pionts ultimately to <7 to reduce her risk of other medical conditions occurring such as slow healing to wound sites. Verified all discharged medications and educated accordingly. Review all pending appointments and verified pt has sufficient transportation (pt uses [employer]). Offered additional sources for SATS if needed in the future. RN discussed and generated a plan of care involved the above goals and interventions to assist pt with EMMI  printable material foe educating pt on A1c, food exchanges with high protein and low carbs for her diet and all medication post d/c and encouraged adherence with all discussed for managing her diabetes. Pt receptive and agreed to completed the initial assessment today and proceed with monthly follow up call on her ongoing management of care.  Plan: Will send successful and welcome letter along with emmi on diabetes. Will also send and A.D packet as discussed today on the assessment.      THN CM Care Plan Problem One     Most Recent Value  Care Plan Problem One Deficient Knowledge related to diabetes unfamiliarity with information  Role Documenting the Problem One Care Management Telephonic [place] for Problem One Active  THN Long Term Goal  Pt will verbalized a reduction in her A1c by 1-2 points within the next 90 days.  THN Long Term Goal Start Date 05/23/20  Interventions for Problem One Long Term Goal Will educate on A1c and the importance of reducing this level. Will discuss possible barriers/fears that my prevent pt from meeting this goal. Will also send educational material on this subject for future discussion. Will verify pt is receptive to goals discussed,.  THN CM Short Term Goal #1  Pt will monitor her blood glucose and keep a log of all readingsi in the [facility] journal within the next 30 days.  THN CM Short Term Goal #1 Start Date 05/23/20  Interventions for Short Term Goal #1 Discussed the improtance of knowing what her readings are to report to her providers on all virtual call. Also alllows the providers to intervene with abnormal readings by adjusting her medications accordingly.  THN CM  Short Term Goal #2  Pt will identify two types of foods to avoid in a diabetic diet within the next 30 days..  THN CM Short Term Goal #2 Start Date 05/23/20  Interventions for Short Term Goal #2 Will discuss foods to avoid that may increase her blood glucose. Discuss exchange food items  that would include high protein and low carbs with her dietary habits with food items. Will also send educational emmi material to ellaborate on food items.         Elliot Cousin, RN Care Management Coordinator [facility] Main Office [phone]

## 2020-05-23 NOTE — Telephone Encounter (Signed)
Patient contacted regarding discharge from [facility] on 05/19/2020 .  Patient understands to follow up with provider Carlean Jews PA-C on 6/17/2021at 4:00 PM at [facility]. Patient understands discharge instructions? yes Patient understands medications and regiment? yes Patient understands to bring all medications to this visit? yes

## 2020-05-24 ENCOUNTER — Telehealth (HOSPITAL_COMMUNITY): Payer: Self-pay

## 2020-05-24 NOTE — Telephone Encounter (Signed)
Pt insurance is active and benefits verified through [facility]. Co-pay $0.00, DED $0.00/$0.00 met, out of pocket $4,500.00/$0.00 met, co-insurance 0%. No pre-authorization required. Passport, 05/24/20 @ 9:33AM, QVH#[id number]  Will contact patient to see if she is interested in the Cardiac Rehab Program. If interested, patient will need to complete follow up appt. Once completed, patient will be contacted for scheduling upon review by the RN Navigator.

## 2020-05-25 ENCOUNTER — Ambulatory Visit: Payer: Medicare Other | Admitting: Physician Assistant

## 2020-05-25 NOTE — Progress Notes (Deleted)
[facility]   MULTIDISCIPLINARY HEART VALVE CLINIC                                       Cardiology Office Note    Date:  05/25/2020   ID:  [name], DOB 1946-06-10, MRN [id number]  PCP:  Cynthia Broker, MD  Cardiologist:  Dr. Duke [name] / Dr. Excell [name] & Dr. Laneta [name] (TAVR)  CC: [facility] s/p TAVR  History of Present Illness:  [name] is a 74 y.o. female with a history of HTN, HLD, uncontrolled DMT2, chronic LE edema, non obst CAD, and severe AS s/p TAVR (05/17/20) who presents to clinic for follow up.   She was admitted from 5/28-05/10/20 for chest pain and elevated troponin. High-sensitivity troponin peaked at 149 then trended down to 128. Cath showed non obst CAD. Echo during this admission showed severe AS and plans were made for outpatient TAVR work up. She was then readmitted for syncope felt to be related to orthostatic hypotension and dehydration. She underwent TAVR work up and ultimately successful TAVR with a 23 mm Edwards Sapien 3 Ultra THV via the TF approach on 05/17/2020.  Postoperative echo showed EF 60%, normally functioning TAVR with a mean gradient of 20 mmHg and no PVL. She was discharged on aspirin and Plavix as well as Lasix 20 mg for chronic lower extremity edema.  Plans were made to discharge her home to a SNF, however, the patient refused and went home with her sister-in-law.  Today she presents to clinic for follow-up.  Past Medical History:  Diagnosis Date  . Achilles tendinitis   . Acute renal failure (ARF) (HCC) 12/19/2014  . Calcaneal spur    right  . Cataract   . Depression   . Diabetes mellitus    type II  . Diverticulosis of colon (without mention of hemorrhage) 2013  . GERD (gastroesophageal reflux disease)   . GI bleed 03/26/14  . Hyperlipidemia   . Hypertension   . Internal hemorrhoids 2013  . Peripheral neuropathy   . Right shoulder pain    Subacromial tendinitis  . 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 Laneta [name] and [name]  . Venous insufficiency   . Ventral hernia     Past Surgical History:  Procedure Laterality Date  . ABDOMINAL HYSTERECTOMY  2001   TAH-BSO  . CHOLECYSTECTOMY  2001  . COLONOSCOPY  2013   diverticulosis   . ESOPHAGOGASTRODUODENOSCOPY  2013   normal   . INCISIONAL HERNIA REPAIR    . RIGHT/LEFT HEART CATH AND CORONARY ANGIOGRAPHY N/A 05/09/2020   Procedure: RIGHT/LEFT HEART CATH AND CORONARY ANGIOGRAPHY;  Surgeon: Corky Crafts, MD;  Location: [facility] INVASIVE CV LAB;  Service: Cardiovascular;  Laterality: N/A;  . TEE WITHOUT CARDIOVERSION N/A 05/17/2020   Procedure: TRANSESOPHAGEAL ECHOCARDIOGRAM (TEE);  Surgeon: Tonny Bollman, MD;  Location: [facility] INVASIVE CV LAB;  Service: Open Heart Surgery;  Laterality: N/A;  . TONSILLECTOMY    . TRANSCATHETER AORTIC VALVE REPLACEMENT, TRANSFEMORAL N/A 05/17/2020   Procedure: TRANSCATHETER AORTIC VALVE REPLACEMENT, TRANSFEMORAL;  Surgeon: Tonny Bollman, MD;  Location: [facility] INVASIVE CV LAB;  Service: Open Heart Surgery;  Laterality: N/A;    Current Medications: Outpatient Medications Prior to Visit  Medication Sig Dispense Refill  . acetaminophen (TYLENOL) 325 MG tablet Take 325 mg  by mouth daily as needed for mild pain or headache.    Marland Kitchen aspirin 81 MG tablet Take 81 mg by mouth daily.    Marland Kitchen atorvastatin (LIPITOR) 40 MG tablet Take 1 tablet (40 mg total) by mouth daily. 90 tablet 3  . citalopram (CELEXA) 20 MG tablet Take 1 tablet (20 mg total) by mouth daily. 90 tablet 1  . clopidogrel (PLAVIX) 75 MG tablet Take 1 tablet (75 mg total) by mouth daily with breakfast. 30 tablet 0  . Continuous Blood Gluc Sensor (FREESTYLE LIBRE SENSOR SYSTEM) MISC Use to monitor sugars 1 each 0  . esomeprazole (NEXIUM) 20 MG capsule Take 1 capsule (20 mg total) by mouth daily at 12 noon. 90 capsule 3  . ezetimibe (ZETIA) 10 MG tablet Take 1 tablet (10 mg total) by mouth  daily. 90 tablet 3  . furosemide (LASIX) 20 MG tablet Take 1 tablet (20 mg total) by mouth daily. 30 tablet 0  . gabapentin (NEURONTIN) 300 MG capsule Take 1 capsule (300 mg total) by mouth 3 (three) times daily. 90 capsule 0  . glucose blood (ONETOUCH VERIO) test strip Use as instructed 150 each 12  . Insulin Lispro Prot & Lispro (HUMALOG MIX 75/25 KWIKPEN) (75-25) 100 UNIT/ML Kwikpen Inject 44 Units into the skin 2 (two) times daily. (Patient taking differently: Inject 45 Units into the skin 2 (two) times daily. ) 30 mL 6  . Insulin Pen Needle 31G X 8 MM MISC Use to inject insulin four times daily E11.41 100 each 3  . metFORMIN (GLUCOPHAGE) 1000 MG tablet Take 0.5 tablets (500 mg total) by mouth 2 (two) times daily with a meal. 90 tablet 3  . metoprolol tartrate (LOPRESSOR) 25 MG tablet Take 0.5 tablets (12.5 mg total) by mouth 2 (two) times daily. 30 tablet 2  . sodium chloride (OCEAN) 0.65 % SOLN nasal spray Place 1 spray into both nostrils 4 (four) times daily. (Patient taking differently: Place 1 spray into both nostrils as needed for congestion. ) 30 mL 2  . tetrahydrozoline 0.05 % ophthalmic solution Place 1-2 drops into both eyes 2 (two) times daily as needed (Dry eyes).    Terrance Mass Salicylate (ASPERCREME EX) Apply 1 application topically daily as needed (shoulder/hips/knee/knuckles).     No facility-administered medications prior to visit.     Allergies:   Morphine, Codeine sulfate, Demerol [meperidine], Meperidine hcl, and Propoxyphene hcl   Social History   Socioeconomic History  . Marital status: Divorced    Spouse name: Not on file  . Number of children: 2  . Years of education: Not on file  . Highest education level: Not on file  Occupational History  . 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 Smoking status: Never Smoker  . Smokeless tobacco: Never Used  Substance and Sexual Activity  . Alcohol use: No  . Drug use:  No  . Sexual activity: Not on file  Other Topics Concern  . Not on file  Social History Narrative   Divorced   Never Smoked   Alcohol use-no   Drug use-no      Recently had to retire from job 2/2 limitaitons of walking      Quilting is a good Public librarian initiated. Patient needs to submit further paperwork to complete   [facility]  March 06, 2010 2:35 PM   Financial assistance approved for 100%  discount at [facility] and has [facility] card   [facility]  March 12, 2010 3:55 PM   Social Determinants of Health   Financial Resource Strain:   . Difficulty of Paying Living Expenses:   Food Insecurity: No Food Insecurity  . Worried About Programme researcher, broadcasting/film/video in the Last Year: Never true  . Ran Out of Food in the Last Year: Never true  Transportation Needs: No Transportation Needs  . Lack of Transportation (Medical): No  . Lack of Transportation (Non-Medical): No  Physical Activity:   . Days of Exercise per Week:   . Minutes of Exercise per Session:   Stress:   . Feeling of Stress :   Social Connections:   . Frequency of Communication with Friends and Family:   . Frequency of Social Gatherings with Friends and Family:   . Attends Religious Services:   . Active Member of Clubs or Organizations:   . Attends Banker Meetings:   Marland Kitchen Marital Status:      Family History:  The patient's family history includes Breast cancer in her paternal aunt; CVA in her father; Diabetes in her paternal grandmother and sister; Heart failure in her father and mother; Other in her brother and sister; Ovarian cancer in her sister.     ROS:   Please see the history of present illness.    ROS All other systems reviewed and are negative.   PHYSICAL EXAM:   VS:  There were no vitals taken for this visit.   GEN: Well nourished, well developed, in no acute distress HEENT: normal Neck: no JVD or masses Cardiac: ***RRR; no murmurs, rubs, or gallops,no edema    Respiratory:  clear to auscultation bilaterally, normal work of breathing GI: soft, nontender, nondistended, + BS MS: no deformity or atrophy Skin: warm and dry, no rash Neuro:  Alert and Oriented x 3, Strength and sensation are intact Psych: euthymic mood, full affect   Wt Readings from Last 3 Encounters:  05/19/20 183 lb 6.8 oz (83.2 kg)  05/08/20 182 lb 1.6 oz (82.6 kg)  03/27/20 196 lb (88.9 kg)      Studies/Labs Reviewed:   EKG:  EKG is*** ordered today.  The ekg ordered today demonstrates ***  Recent Labs: 05/10/2020: ALT 46 05/11/2020: B Natriuretic Peptide 72.9 05/14/2020: TSH 4.000 05/18/2020: BUN 16; Creatinine, Ser 0.65; Hemoglobin 14.0; Magnesium 1.6; Platelets 212; Potassium 4.0; Sodium 133   Lipid Panel    Component Value Date/Time   CHOL 194 03/20/2020 1157   TRIG 170.0 (H) 03/20/2020 1157   HDL 36.30 (L) 03/20/2020 1157   CHOLHDL 5 03/20/2020 1157   VLDL 34.0 03/20/2020 1157   LDLCALC 124 (H) 03/20/2020 1157   LDLDIRECT 110.0 11/12/2018 1419    Additional studies/ records that were reviewed today include:  TAVR OPERATIVE NOTE   Date of Procedure:05/17/2020  Preoperative Diagnosis:Severe Aortic Stenosis   Postoperative Diagnosis:Same   Procedure:   Transcatheter Aortic Valve Replacement - PercutaneousRightTransfemoral Approach Edwards Sapien 3 Ultra THV (size 69mm, model # I1735201, serial # [id number])  Co-Surgeons:Bryan Jennefer Bravo, MD and Tonny Bollman, MD    Anesthesiologist:Carswell Jean Rosenthal, MD  Echocardiographer:W. O'Neal, MD  Pre-operative Echo Findings: ? Severe aortic stenosis ? Normalleft ventricular systolic function  Post-operative Echo Findings: ? Noparavalvular leak ? Normalleft ventricular systolic function  _______________________   Echo 05/18/20 IMPRESSIONS  1. 23 mm S3. V max 2.9 m/s, mean gardient  20 mmHG, EOA 1.72 cm2, DI 0.50.  Vmax/gradient slightly higher due  to high flow (LVOT VTI 26 cm). Normally  functioning prosthetic TAVR without evidence of paravalvular leak. The  aortic valve has been  repaired/replaced. Aortic valve regurgitation is not visualized. There is  a 23 mm Edwards Sapien prosthetic (TAVR) valve present in the aortic  position. Procedure Date: 05/17/2020.  2. Left ventricular ejection fraction, by estimation, is 60 to 65%. The  left ventricle has normal function. The left ventricle has no regional  wall motion abnormalities. Left ventricular diastolic parameters are  consistent with Grade II diastolic  dysfunction (pseudonormalization). Elevated left atrial pressure.  3. Right ventricular systolic function is normal. The right ventricular  size is normal. There is normal pulmonary artery systolic pressure. The  estimated right ventricular systolic pressure is 66.2 mmHg.  4. The mitral valve is grossly normal. Trivial mitral valve  regurgitation. No evidence of mitral stenosis.  5. The inferior vena cava is normal in size with greater than 50%  respiratory variability, suggesting right atrial pressure of 3 mmHg.   Comparison(s): Changes from prior study are noted.    ASSESSMENT & PLAN:   Severe AS s/p TAVR: check BMET ***   Medication Adjustments/Labs and Tests Ordered: Current medicines are reviewed at length with the patient today.  Concerns regarding medicines are outlined above.  Medication changes, Labs and Tests ordered today are listed in the Patient Instructions below. There are no Patient Instructions on file for this visit.   Signed, Angelena Form, PA-C  05/25/2020 1:08 PM    [facility] [address], [place], [place]  [address] Phone: [phone]; Fax: [phone]

## 2020-05-26 ENCOUNTER — Encounter: Payer: Self-pay | Admitting: Internal Medicine

## 2020-05-26 ENCOUNTER — Other Ambulatory Visit: Payer: Self-pay

## 2020-05-26 ENCOUNTER — Ambulatory Visit (INDEPENDENT_AMBULATORY_CARE_PROVIDER_SITE_OTHER): Payer: Medicare Other | Admitting: Internal Medicine

## 2020-05-26 VITALS — BP 126/84 | HR 86 | Temp 98.6°F | Ht 64.0 in | Wt 197.0 lb

## 2020-05-26 DIAGNOSIS — R6889 Other general symptoms and signs: Secondary | ICD-10-CM | POA: Diagnosis not present

## 2020-05-26 DIAGNOSIS — E1149 Type 2 diabetes mellitus with other diabetic neurological complication: Secondary | ICD-10-CM

## 2020-05-26 DIAGNOSIS — IMO0002 Reserved for concepts with insufficient information to code with codable children: Secondary | ICD-10-CM

## 2020-05-26 DIAGNOSIS — E1165 Type 2 diabetes mellitus with hyperglycemia: Secondary | ICD-10-CM | POA: Diagnosis not present

## 2020-05-26 DIAGNOSIS — N179 Acute kidney failure, unspecified: Secondary | ICD-10-CM

## 2020-05-26 LAB — CBC
HCT: 39.5 % (ref 36.0–46.0)
Hemoglobin: 13.4 g/dL (ref 12.0–15.0)
MCHC: 33.8 g/dL (ref 30.0–36.0)
MCV: 83.1 fl (ref 78.0–100.0)
Platelets: 225 10*3/uL (ref 150.0–400.0)
RBC: 4.76 Mil/uL (ref 3.87–5.11)
RDW: 14 % (ref 11.5–15.5)
WBC: 8.5 10*3/uL (ref 4.0–10.5)

## 2020-05-26 LAB — COMPREHENSIVE METABOLIC PANEL
ALT: 34 U/L (ref 0–35)
AST: 25 U/L (ref 0–37)
Albumin: 3.6 g/dL (ref 3.5–5.2)
Alkaline Phosphatase: 241 U/L — ABNORMAL HIGH (ref 39–117)
BUN: 16 mg/dL (ref 6–23)
CO2: 33 mEq/L — ABNORMAL HIGH (ref 19–32)
Calcium: 9.3 mg/dL (ref 8.4–10.5)
Chloride: 100 mEq/L (ref 96–112)
Creatinine, Ser: 0.66 mg/dL (ref 0.40–1.20)
GFR: 87.72 mL/min (ref >60.00)
Glucose, Bld: 194 mg/dL — ABNORMAL HIGH (ref 70–99)
Potassium: 4.4 mEq/L (ref 3.5–5.1)
Sodium: 135 mEq/L (ref 135–145)
Total Bilirubin: 0.6 mg/dL (ref 0.2–1.2)
Total Protein: 7.5 g/dL (ref 6.0–8.3)

## 2020-05-26 NOTE — Progress Notes (Signed)
   Subjective:   Patient ID: [name], female    DOB: [birth year], 75 y.o.   MRN: [id number]  HPI The patient is a 74 YO female coming in for hospital follow up (in hospital twice since our last visit including for NSTEMI with stents, then for syncope related to aortic valve stenosis and s/p tavr during most recent admission). She is feeling improved since being home. Less SOB and no chest pains. Denies abdominal pain or bloating. Has chronic diarrhea which is stable, denies constipation. Denies blood in stool. Overall is improving and using cane at home, walker when outside home. Missed follow up visit with cardiology yesterday due to her ride arriving late to pick her up. She is going to call and reschedule that. Denies fevers or chills.   PMH, [facility], social history reviewed and updated  Review of Systems  Constitutional: Positive for activity change.  HENT: Negative.   Eyes: Negative.   Respiratory: Negative for cough, chest tightness and shortness of breath.   Cardiovascular: Negative for chest pain, palpitations and leg swelling.  Gastrointestinal: Negative for abdominal distention, abdominal pain, constipation, diarrhea, nausea and vomiting.  Musculoskeletal: Positive for arthralgias.  Skin: Negative.   Neurological: Positive for numbness.  Psychiatric/Behavioral: Negative.     Objective:  Physical Exam Constitutional:      Appearance: She is well-developed. She is obese.  HENT:     Head: Normocephalic and atraumatic.  Cardiovascular:     Rate and Rhythm: Normal rate and regular rhythm.     Heart sounds: Murmur heard.   Pulmonary:     Effort: Pulmonary effort is normal. No respiratory distress.     Breath sounds: Normal breath sounds. No wheezing or rales.  Abdominal:     General: Bowel sounds are normal. There is no distension.     Palpations: Abdomen is soft.     Tenderness: There is no abdominal tenderness. There is no rebound.  Musculoskeletal:     Cervical  back: Normal range of motion.     Comments: Legs swollen stable from usual  Skin:    General: Skin is warm and dry.  Neurological:     Mental Status: She is alert and oriented to person, place, and time.     Coordination: Coordination abnormal.     Comments: Stable gait with walker in office.      Vitals:   05/26/20 1036  BP: 126/84  Pulse: 86  Temp: 98.6 F (37 C)  TempSrc: Oral  SpO2: 96%  Weight: 197 lb (89.4 kg)  Height: 5\' 4"  (1.626 m)    This visit occurred during the SARS-CoV-2 public health emergency.  Safety protocols were in place, including screening questions prior to the visit, additional usage of staff PPE, and extensive cleaning of exam room while observing appropriate contact time as indicated for disinfecting solutions.   Assessment & Plan:

## 2020-05-26 NOTE — Assessment & Plan Note (Signed)
Check CMP today and adjust as needed. Her ARB was stopped in hospital stays so if stable depending on cardiology plan should be restarted due to diabetes which is not well controlled.

## 2020-05-26 NOTE — Assessment & Plan Note (Signed)
Sugars are stable overall, diet is stable. She is taking her humalog 75/25 per her reports and metformin although the metformin contributes to her diarrhea. She is currently on statin but not ARB due to AKI.

## 2020-05-26 NOTE — Patient Instructions (Signed)
We are checking the labs today and would recommend to do the cardiac rehab to build up the strength and make sure the heart is doing good.

## 2020-05-30 ENCOUNTER — Ambulatory Visit: Payer: Medicare Other | Admitting: *Deleted

## 2020-05-31 ENCOUNTER — Other Ambulatory Visit: Payer: Self-pay | Admitting: *Deleted

## 2020-05-31 ENCOUNTER — Ambulatory Visit: Payer: Medicare Other | Admitting: Physician Assistant

## 2020-05-31 ENCOUNTER — Encounter: Payer: Self-pay | Admitting: *Deleted

## 2020-05-31 NOTE — Patient Outreach (Addendum)
[facility]) Care Management  05/31/2020  [name] 08/02/1946 [id number]   CSW received referral on 05/24/2020 for meal assistance.  CSW contacted pt and confirmed pt identity.  CSW introduced self, role and reason for call.  Per pt, she lives alone and does not drive.  Her sister in law helps her go to grocery store or will do it for her weekly.  Pt is interested in a trial of the [employer] meals and may be willing to pay ongoing (longer term) if desired.  CSW will also make referral to meals on wheels- pt advised of their long waiting list.  Pt is also interested in info on life alert options- CSW will mail resources to her and plan to touch base next month.    Reece Levy, MSW, LCSW Clinical Social Worker  [facility] [phone]      Reece Levy, MSW, LCSW Clinical Social Worker  [facility] [employer] [phone]

## 2020-06-05 ENCOUNTER — Other Ambulatory Visit: Payer: Self-pay

## 2020-06-05 NOTE — Telephone Encounter (Signed)
This is Dr. [place]'s pt.  °

## 2020-06-06 MED ORDER — METOPROLOL TARTRATE 25 MG PO TABS: 12.5000 mg | ORAL_TABLET | Freq: Two times a day (BID) | ORAL | 2 refills | Status: DC

## 2020-06-08 ENCOUNTER — Telehealth: Payer: Self-pay | Admitting: Cardiovascular Disease

## 2020-06-08 ENCOUNTER — Ambulatory Visit: Payer: Medicare Other | Admitting: Skilled Nursing Facility1

## 2020-06-08 NOTE — Telephone Encounter (Signed)
Pt c/o medication issue:  1. Name of Medication: metoprolol tartrate (LOPRESSOR) 25 MG tablet  2. How are you currently taking this medication (dosage and times per day)?   3. Are you having a reaction (difficulty breathing--STAT)?   4. What is your medication issue? Para March from [facility] is calling wanting to know the Supervising Physician for this medication. Please advise.

## 2020-06-08 NOTE — Telephone Encounter (Signed)
Returned call-supervising MD information provided

## 2020-06-14 ENCOUNTER — Other Ambulatory Visit: Payer: Self-pay | Admitting: *Deleted

## 2020-06-14 NOTE — Progress Notes (Addendum)
[facility]   MULTIDISCIPLINARY HEART VALVE CLINIC                                       Cardiology Office Note    Date:  06/15/2020   ID:  [name], DOB 07-23-1946, MRN [id number]  PCP:  Cynthia Broker, MD  Cardiologist: Dr. Duke Salvia / Dr. Excell Seltzer & Dr. Laneta Simmers (TAVR)  CC: 1 month s/p TAVR  History of Present Illness:  [name] is a 74 y.o. female with a history of HTN, HLD, uncontrolled DMT2, chronic LE edema, non obst CAD, and severe AS s/p TAVR (05/17/20) who presents to clinic for follow up.   She was admitted from 5/28-05/10/20 for chest pain and elevated troponin. High-sensitivity troponin peaked at 149 then trended down to 128. Cath showed non obst CAD. Echo during this admission showed severe AS and plans were made for outpatient TAVR work up. She was then readmitted for syncope felt to be related to orthostatic hypotension and dehydration. She underwent TAVR work up and ultimately successful TAVR with a 23 mm Edwards Sapien 3 Ultra THV via the TF approach on 05/17/2020. Postoperative echo showed EF 60%, normally functioning TAVR with a mean gradient of 20 mmHg and no PVL. She was discharged on aspirin and Plavix as well as Lasix 20 mg for chronic lower extremity edema. Plans were made to discharge her home to a SNF, however, the patient refused and went home with her sister-in-law. She did not show up for her 1 week TOC follow up.   Today she presents to clinic for follow-up. Here with her sister. She has been having worsening LE and pedal edema. She has had chronic LE edema but it has become significantly worse recently. Sometimes has orthopnea. Does not have a lot of UOP with lasix 20mg  daily. No shortness of breath otherwise. Has had a big improvement in breathing since TAVR. Not very active - walks with a cane when goes to the mail box. Uses a motorized cart at the grocery store. Does not drive. Has leg pain with burning and tingling, mostly  when she lays in bed at night. This also limits her walking.    Past Medical History:  Diagnosis Date  . Achilles tendinitis   . Acute renal failure (ARF) (HCC) 12/19/2014  . Calcaneal spur    right  . Cataract   . Depression   . Diabetes mellitus    type II  . Diverticulosis of colon (without mention of hemorrhage) 2013  . GERD (gastroesophageal reflux disease)   . GI bleed 03/26/14  . Hyperlipidemia   . Hypertension   . Internal hemorrhoids 2013  . Peripheral neuropathy   . Right shoulder pain    Subacromial tendinitis  . 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 07/17/2020 and Laneta Simmers  . Venous insufficiency   . Ventral hernia     Past Surgical History:  Procedure Laterality Date  . ABDOMINAL HYSTERECTOMY  2001   TAH-BSO  . CHOLECYSTECTOMY  2001  . COLONOSCOPY  2013   diverticulosis   . ESOPHAGOGASTRODUODENOSCOPY  2013   normal   . INCISIONAL HERNIA REPAIR    . RIGHT/LEFT HEART CATH AND CORONARY ANGIOGRAPHY N/A 05/09/2020   Procedure: RIGHT/LEFT HEART CATH AND CORONARY ANGIOGRAPHY;  Surgeon: 07/09/2020  S, MD;  Location: [facility] INVASIVE CV LAB;  Service: Cardiovascular;  Laterality: N/A;  . TEE WITHOUT CARDIOVERSION N/A 05/17/2020   Procedure: TRANSESOPHAGEAL ECHOCARDIOGRAM (TEE);  Surgeon: Tonny Bollman, MD;  Location: [facility] INVASIVE CV LAB;  Service: Open Heart Surgery;  Laterality: N/A;  . TONSILLECTOMY    . TRANSCATHETER AORTIC VALVE REPLACEMENT, TRANSFEMORAL N/A 05/17/2020   Procedure: TRANSCATHETER AORTIC VALVE REPLACEMENT, TRANSFEMORAL;  Surgeon: Tonny Bollman, MD;  Location: [facility] INVASIVE CV LAB;  Service: Open Heart Surgery;  Laterality: N/A;    Current Medications: Outpatient Medications Prior to Visit  Medication Sig Dispense Refill  . acetaminophen (TYLENOL) 325 MG tablet Take 325 mg by mouth daily as needed for mild pain or headache.    Marland Kitchen aspirin 81 MG tablet Take 81 mg by mouth  daily.    Marland Kitchen atorvastatin (LIPITOR) 40 MG tablet Take 1 tablet (40 mg total) by mouth daily. 90 tablet 3  . citalopram (CELEXA) 20 MG tablet Take 1 tablet (20 mg total) by mouth daily. 90 tablet 1  . clopidogrel (PLAVIX) 75 MG tablet Take 1 tablet (75 mg total) by mouth daily with breakfast. 30 tablet 0  . Continuous Blood Gluc Sensor (FREESTYLE LIBRE SENSOR SYSTEM) MISC Use to monitor sugars 1 each 0  . esomeprazole (NEXIUM) 20 MG capsule Take 1 capsule (20 mg total) by mouth daily at 12 noon. 90 capsule 3  . ezetimibe (ZETIA) 10 MG tablet Take 1 tablet (10 mg total) by mouth daily. 90 tablet 3  . gabapentin (NEURONTIN) 300 MG capsule Take 1 capsule (300 mg total) by mouth 3 (three) times daily. 90 capsule 0  . glucose blood (ONETOUCH VERIO) test strip Use as instructed 150 each 12  . Insulin Lispro Prot & Lispro (HUMALOG MIX 75/25 KWIKPEN) (75-25) 100 UNIT/ML Kwikpen Inject 44 Units into the skin 2 (two) times daily. (Patient taking differently: Inject 45 Units into the skin 2 (two) times daily. ) 30 mL 6  . Insulin Pen Needle 31G X 8 MM MISC Use to inject insulin four times daily E11.41 100 each 3  . metFORMIN (GLUCOPHAGE) 1000 MG tablet Take 0.5 tablets (500 mg total) by mouth 2 (two) times daily with a meal. 90 tablet 3  . metoprolol tartrate (LOPRESSOR) 25 MG tablet Take 0.5 tablets (12.5 mg total) by mouth 2 (two) times daily. 90 tablet 2  . sodium chloride (OCEAN) 0.65 % SOLN nasal spray Place 1 spray into both nostrils 4 (four) times daily. (Patient taking differently: Place 1 spray into both nostrils as needed for congestion. ) 30 mL 2  . tetrahydrozoline 0.05 % ophthalmic solution Place 1-2 drops into both eyes 2 (two) times daily as needed (Dry eyes).    Terrance Mass Salicylate (ASPERCREME EX) Apply 1 application topically daily as needed (shoulder/hips/knee/knuckles).    . furosemide (LASIX) 20 MG tablet Take 1 tablet (20 mg total) by mouth daily. 30 tablet 0   No facility-administered  medications prior to visit.     Allergies:   Morphine, Codeine sulfate, Demerol [meperidine], Meperidine hcl, and Propoxyphene hcl   Social History   Socioeconomic History  . Marital status: Divorced    Spouse name: Not on file  . Number of children: 2  . Years of education: Not on file  . Highest education level: Not on file  Occupational History  . 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 Smoking status: Never Smoker  . Smokeless tobacco: Never Used  Substance and Sexual Activity  . Alcohol use: No  . Drug use: No  . Sexual activity: Not on file  Other Topics Concern  . Not on file  Social History Narrative   Divorced   Never Smoked   Alcohol use-no   Drug use-no      Recently had to retire from job 2/2 limitaitons of walking      Quilting is a good Public librarian initiated. Patient needs to submit further paperwork to complete   Rudell Cobb  March 06, 2010 2:35 PM   Financial assistance approved for 100% discount at [facility] and has [facility] card   Rudell Cobb  March 12, 2010 3:55 PM   Social Determinants of Health   Financial Resource Strain:   . Difficulty of Paying Living Expenses:   Food Insecurity: No Food Insecurity  . Worried About Programme researcher, broadcasting/film/video in the Last Year: Never true  . Ran Out of Food in the Last Year: Never true  Transportation Needs: No Transportation Needs  . Lack of Transportation (Medical): No  . Lack of Transportation (Non-Medical): No  Physical Activity:   . Days of Exercise per Week:   . Minutes of Exercise per Session:   Stress:   . Feeling of Stress :   Social Connections:   . Frequency of Communication with Friends and Family:   . Frequency of Social Gatherings with Friends and Family:   . Attends Religious Services:   . Active Member of Clubs or Organizations:   . Attends Banker Meetings:   Marland Kitchen Marital Status:      Family  History:  The patient's family history includes Breast cancer in her paternal aunt; CVA in her father; Diabetes in her paternal grandmother and sister; Heart failure in her father and mother; Other in her brother and sister; Ovarian cancer in her sister.     ROS:   Please see the history of present illness.    ROS All other systems reviewed and are negative.   PHYSICAL EXAM:   VS:  BP 130/62   Pulse 72   Ht  (1.626 m)   Wt 192 lb 6 oz (87.3 kg)   SpO2 95%   BMI 33.02 kg/m    GEN: Well nourished, well developed, in no acute distress HEENT: normal Neck: no JVD or masses Cardiac: RRR; no murmurs, rubs, or gallops. 2+ bilateral edema up to knees and in feet.  Respiratory:  clear to auscultation bilaterally, normal work of breathing GI: soft, nontender, nondistended, + BS MS: no deformity or atrophy Skin: warm and dry, no rash Neuro:  Alert and Oriented x 3, Strength and sensation are intact Psych: euthymic mood, full affect   Wt Readings from Last 3 Encounters:  06/15/20 192 lb 6 oz (87.3 kg)  05/26/20 197 lb (89.4 kg)  05/19/20 183 lb 6.8 oz (83.2 kg)      Studies/Labs Reviewed:   EKG:  EKG is NOT ordered today.    Recent Labs: 05/11/2020: B Natriuretic Peptide 72.9 05/14/2020: TSH 4.000 05/18/2020: Magnesium 1.6 05/26/2020: ALT 34; BUN 16; Creatinine, Ser 0.66; Hemoglobin 13.4; Platelets 225.0; Potassium 4.4; Sodium 135   Lipid Panel    Component Value Date/Time   CHOL 194 03/20/2020 1157   TRIG 170.0 (H) 03/20/2020 1157   HDL 36.30 (L) 03/20/2020 1157   CHOLHDL 5 03/20/2020 1157   VLDL 34.0 03/20/2020 1157   LDLCALC 124 (  H) 03/20/2020 1157   LDLDIRECT 110.0 11/12/2018 1419    Additional studies/ records that were reviewed today include:  TAVR OPERATIVE NOTE   Date of Procedure:05/17/2020  Preoperative Diagnosis:Severe Aortic Stenosis   Postoperative Diagnosis:Same   Procedure:   Transcatheter Aortic Valve  Replacement - PercutaneousRightTransfemoral Approach Edwards Sapien 3 Ultra THV (size 23mm, model # [id number], serial # [id number])  Co-Surgeons:Bryan Jennefer BravoK. Bartle, MD and Tonny BollmanMichael Cooper, MD    Anesthesiologist:Carswell Jean RosenthalJackson, MD  Echocardiographer:W. O'Neal, MD  Pre-operative Echo Findings: ? Severe aortic stenosis ? Normalleft ventricular systolic function  Post-operative Echo Findings: ? Noparavalvular leak ? Normalleft ventricular systolic function  _______________________   Echo 05/18/20 IMPRESSIONS  1. 23 mm S3. V max 2.9 m/s, mean gardient 20 mmHG, EOA 1.72 cm2, DI 0.50.  Vmax/gradient slightly higher due to high flow (LVOT VTI 26 cm). Normally  functioning prosthetic TAVR without evidence of paravalvular leak. The  aortic valve has been  repaired/replaced. Aortic valve regurgitation is not visualized. There is  a 23 mm Edwards Sapien prosthetic (TAVR) valve present in the aortic  position. Procedure Date: 05/17/2020.  2. Left ventricular ejection fraction, by estimation, is 60 to 65%. The  left ventricle has normal function. The left ventricle has no regional  wall motion abnormalities. Left ventricular diastolic parameters are  consistent with Grade II diastolic  dysfunction (pseudonormalization). Elevated left atrial pressure.  3. Right ventricular systolic function is normal. The right ventricular  size is normal. There is normal pulmonary artery systolic pressure. The  estimated right ventricular systolic pressure is 29.8 mmHg.  4. The mitral valve is grossly normal. Trivial mitral valve  regurgitation. No evidence of mitral stenosis.  5. The inferior vena cava is normal in size with greater than 50%  respiratory variability, suggesting right atrial pressure of 3 mmHg.   Comparison(s): Changes from prior study are noted.    ____________________   Echo  06/15/20 IMPRESSIONS  1. Definity used; normal LV systolic function; grade 2 diastolic  dysfunction; mild LVH; s/p TAVR with mean gradient 12 mmHg, AVA 1.1 cm2  and no AI.  2. Left ventricular ejection fraction, by estimation, is 60 to 65%. The  left ventricle has normal function. The left ventricle has no regional  wall motion abnormalities. There is mild left ventricular hypertrophy.  Left ventricular diastolic parameters  are consistent with Grade II diastolic dysfunction (pseudonormalization).  Elevated left atrial pressure.  3. Right ventricular systolic function is normal. The right ventricular  size is normal.  4. The mitral valve is normal in structure. Trivial mitral valve  regurgitation. No evidence of mitral stenosis.  5. The aortic valve has been repaired/replaced. Aortic valve  regurgitation is not visualized. No aortic stenosis is present. There is a  bioprosthetic valve present in the aortic position.  6. The inferior vena cava is normal in size with greater than 50%  respiratory variability, suggesting right atrial pressure of 3 mmHg.     ASSESSMENT & PLAN:   Severe AS s/p TAVR: Echo today shows EF 60%, normally functioning TAVR with a mean gradient of 12 mmHg and no PVL. She has NYHA class II symptoms, but not very active. Continue on aspirin and plavix. She can stop plavix on 11/16/20 ( 6 months out from TAVR). SBE prophylaxis discussed; I have RX'd amoxicillin. I will see her back in 1 year for follow up and echo.   Acute on chronic diastolic CHF: on lasix 20mg  daily. Has chroinc LE edema but significantly worse recently. Will increase  Lasix to 40mg  daily now. I have also asked her to wear compression stockings. Check BMET and BNP today. She will call if her symptoms do not improve on increased diuretics.   Medication Adjustments/Labs and Tests Ordered: Current medicines are reviewed at length with the patient today.  Concerns regarding medicines are outlined  above.  Medication changes, Labs and Tests ordered today are listed in the Patient Instructions below. Patient Instructions  Medication Instructions:  1) INCREASE Furosemide to 40mg  once daily 2) Take 2 grams (4 capsules) of Amoxicillin 1 hour prior to dental work.   3) You may DISCONTINUE Plavix on 11/16/2020.  *If you need a refill on your cardiac medications before your next appointment, please call your pharmacy*   Lab Work: BMET and Pro BNP today  If you have labs (blood work) drawn today and your tests are completely normal, you will receive your results only by: MyChart Message (if you have MyChart) OR . A paper copy in the mail If you have any lab test that is abnormal or we need to change your treatment, we will call you to review the results.   Testing/Procedures: None   Follow-Up: At [facility], you and your health needs are our priority.  As part of our continuing mission to provide you with exceptional heart care, we have created designated Provider Care Teams.  These Care Teams include your primary Cardiologist (physician) and Advanced Practice Providers (APPs -  Physician Assistants and Nurse Practitioners) who all work together to provide you with the care you need, when you need it.  We recommend signing up for the patient portal called "MyChart".  Sign up information is provided on this After Visit Summary.  MyChart is used to connect with patients for Virtual Visits (Telemedicine).  Patients are able to view lab/test results, encounter notes, upcoming appointments, etc.  Non-urgent messages can be sent to your provider as well.   To learn more about what you can do with MyChart, go to Marland Kitchen.    Your next appointment:   3 month(s)  The format for your next appointment:   In Person  Provider:   You may see [facility], MD or one of the following Advanced Practice Providers on your designated Care Team:    [email or web address], PA-C  [place], Corine Shelter  [place], FNP    Other Instructions      Signed, [place], Edd Fabian  06/15/2020 3:15 PM    [facility] [address] [place], [place], [place]  [place] Phone: [phone]; Fax: [phone]

## 2020-06-14 NOTE — Patient Outreach (Signed)
[facility]) Care Management  06/14/2020  [name] 1946-10-16 [id number]   CSW made contact with pt today and updated her on the meal delivery program.  Pt reminded to expect a phone call from the [employer] meals in the next 5 days to begin delivery.   Pt confirms receipt of the material mailed to her however has not had a chance to review.  CSW will touch base with pt in 5-10 days for updates.  Reece Levy, MSW, LCSW Clinical Social Worker  [facility] [phone] \

## 2020-06-15 ENCOUNTER — Other Ambulatory Visit: Payer: Self-pay

## 2020-06-15 ENCOUNTER — Ambulatory Visit (HOSPITAL_COMMUNITY): Payer: Medicare Other | Attending: Cardiology

## 2020-06-15 ENCOUNTER — Other Ambulatory Visit: Payer: Self-pay | Admitting: Physician Assistant

## 2020-06-15 ENCOUNTER — Ambulatory Visit: Payer: Medicare Other | Admitting: Physician Assistant

## 2020-06-15 ENCOUNTER — Encounter: Payer: Self-pay | Admitting: Physician Assistant

## 2020-06-15 VITALS — BP 130/62 | HR 72 | Ht 64.0 in | Wt 192.4 lb

## 2020-06-15 DIAGNOSIS — I5033 Acute on chronic diastolic (congestive) heart failure: Secondary | ICD-10-CM | POA: Diagnosis not present

## 2020-06-15 DIAGNOSIS — R2243 Localized swelling, mass and lump, lower limb, bilateral: Secondary | ICD-10-CM | POA: Diagnosis not present

## 2020-06-15 DIAGNOSIS — Z952 Presence of prosthetic heart valve: Secondary | ICD-10-CM | POA: Diagnosis not present

## 2020-06-15 DIAGNOSIS — R609 Edema, unspecified: Secondary | ICD-10-CM | POA: Diagnosis not present

## 2020-06-15 LAB — ECHOCARDIOGRAM COMPLETE
Height: 64 in
Weight: 3078 oz

## 2020-06-15 MED ORDER — PERFLUTREN LIPID MICROSPHERE
1.0000 mL | INTRAVENOUS | Status: AC | PRN
  Administered 2020-06-15: 2 mL via INTRAVENOUS

## 2020-06-15 MED ORDER — AMOXICILLIN 500 MG PO CAPS
ORAL_CAPSULE | ORAL | 3 refills | Status: DC
Start: 2020-06-15 — End: 2021-03-15

## 2020-06-15 MED ORDER — FUROSEMIDE 40 MG PO TABS
40.0000 mg | ORAL_TABLET | Freq: Every day | ORAL | 3 refills | Status: DC
Start: 2020-06-15 — End: 2021-03-18

## 2020-06-15 NOTE — Patient Instructions (Signed)
Medication Instructions:  1) INCREASE Furosemide to 40mg  once daily 2) Take 2 grams (4 capsules) of Amoxicillin 1 hour prior to dental work.   3) You may DISCONTINUE Plavix on 11/16/2020.  *If you need a refill on your cardiac medications before your next appointment, please call your pharmacy*   Lab Work: BMET and Pro BNP today  If you have labs (blood work) drawn today and your tests are completely normal, you will receive your results only by: 14/08/2020 MyChart Message (if you have MyChart) OR . A paper copy in the mail If you have any lab test that is abnormal or we need to change your treatment, we will call you to review the results.   Testing/Procedures: None   Follow-Up: At [facility], you and your health needs are our priority.  As part of our continuing mission to provide you with exceptional heart care, we have created designated Provider Care Teams.  These Care Teams include your primary Cardiologist (physician) and Advanced Practice Providers (APPs -  Physician Assistants and Nurse Practitioners) who all work together to provide you with the care you need, when you need it.  We recommend signing up for the patient portal called "MyChart".  Sign up information is provided on this After Visit Summary.  MyChart is used to connect with patients for Virtual Visits (Telemedicine).  Patients are able to view lab/test results, encounter notes, upcoming appointments, etc.  Non-urgent messages can be sent to your provider as well.   To learn more about what you can do with MyChart, go to [facility].    Your next appointment:   3 month(s)  The format for your next appointment:   In Person  Provider:   You may see [email or web address], MD or one of the following Advanced Practice Providers on your designated Care Team:    Chilton Si, PA-C  [place], [place]  [place], Edd Fabian    Other Instructions

## 2020-06-16 ENCOUNTER — Telehealth: Payer: Self-pay

## 2020-06-16 DIAGNOSIS — I5033 Acute on chronic diastolic (congestive) heart failure: Secondary | ICD-10-CM

## 2020-06-16 LAB — BASIC METABOLIC PANEL
BUN/Creatinine Ratio: 21 (ref 12–28)
BUN: 14 mg/dL (ref 8–27)
CO2: 26 mmol/L (ref 20–29)
Calcium: 9.1 mg/dL (ref 8.7–10.3)
Chloride: 100 mmol/L (ref 96–106)
Creatinine, Ser: 0.67 mg/dL (ref 0.57–1.00)
GFR calc Af Amer: 101 mL/min/{1.73_m2} (ref >59)
GFR calc non Af Amer: 87 mL/min/{1.73_m2} (ref >59)
Glucose: 211 mg/dL — ABNORMAL HIGH (ref 65–99)
Potassium: 4.2 mmol/L (ref 3.5–5.2)
Sodium: 138 mmol/L (ref 134–144)

## 2020-06-16 LAB — PRO B NATRIURETIC PEPTIDE: NT-Pro BNP: 308 pg/mL — ABNORMAL HIGH (ref 0–301)

## 2020-06-16 NOTE — Telephone Encounter (Signed)
Left message for patient with results (ok per DPR). Advised to call back with any questions. Patient scheduled for repeat BMET.

## 2020-06-16 NOTE — Telephone Encounter (Signed)
-----   Message from Janetta Hora, PA-C sent at 06/16/2020  8:16 AM EDT ----- Labs look okay. BNP not significantly elevated meaning her lower extremity edema is less likely coming form her heart. Continue same plan of compression stockings and increased lasix. Lets get a repeat BMET in 2 weeks to follow elctrolytes

## 2020-06-16 NOTE — Telephone Encounter (Signed)
Follow Up ° °Patient is returning call. Please give patient a call back.  °

## 2020-06-16 NOTE — Telephone Encounter (Signed)
Left message to call back  

## 2020-06-19 ENCOUNTER — Other Ambulatory Visit: Payer: Self-pay | Admitting: Internal Medicine

## 2020-06-19 ENCOUNTER — Telehealth (HOSPITAL_COMMUNITY): Payer: Self-pay | Admitting: *Deleted

## 2020-06-19 DIAGNOSIS — E1165 Type 2 diabetes mellitus with hyperglycemia: Secondary | ICD-10-CM

## 2020-06-19 DIAGNOSIS — IMO0002 Reserved for concepts with insufficient information to code with codable children: Secondary | ICD-10-CM

## 2020-06-19 DIAGNOSIS — E1149 Type 2 diabetes mellitus with other diabetic neurological complication: Secondary | ICD-10-CM

## 2020-06-19 NOTE — Telephone Encounter (Signed)
Pt, [name], returned my call.  Pt agreed that she as not physically able to participate in Cardiac rehab.  Advised pt on exercises she could do at home in the chair.  Pt thanked me for the call. Will close this referral. Karlene Lineman RN, BSN Cardiac and Pulmonary Rehab Nurse Navigator

## 2020-06-19 NOTE — Telephone Encounter (Signed)
Follow up   Pt is returning call    

## 2020-06-19 NOTE — Telephone Encounter (Signed)
Reviewed lab results with patient who verbalized understanding.   Reiterated to her plan for compression stockings and Lasix.  BMET has been scheduled for next week.  She states she is almost out of medications but is not sure which ones. She will check and call back tomorrow to have refilled.

## 2020-06-19 NOTE — Telephone Encounter (Signed)
Received referral for this pt to participate in cardiac rehab. Called and left message for pt interest and ability to participate in a group exercise class. Alanson Aly, BSN Cardiac and Emergency planning/management officer

## 2020-06-20 ENCOUNTER — Other Ambulatory Visit: Payer: Self-pay | Admitting: *Deleted

## 2020-06-21 ENCOUNTER — Ambulatory Visit: Payer: Medicare Other | Admitting: *Deleted

## 2020-06-21 NOTE — Patient Outreach (Signed)
[facility]) Care Management  06/21/2020  [name] 07/04/1946 [id number]   CSW spoke with pt on 06/20/2020 who is appreciative of the meals delivered through [employer] meals and does not need any more delivered.  Pt has also confirmed receipt of the community resource info mailed to her.  Pt denies any further needs and CSW will sign off.  CSW will advise PCP and [facility] team of above.   Reece Levy, MSW, LCSW Clinical Social Worker  [facility] [employer] [phone]

## 2020-06-23 MED ORDER — CLOPIDOGREL BISULFATE 75 MG PO TABS: 75.0000 mg | ORAL_TABLET | Freq: Every day | ORAL | 1 refills | Status: DC

## 2020-06-23 NOTE — Addendum Note (Signed)
Addended by: Gunnar Fusi A on: 06/23/2020 02:44 PM   Modules accepted: Orders

## 2020-06-23 NOTE — Telephone Encounter (Signed)
Called to confirm the patient has all her medications since she did not return call. She states her supplies are fine and she will call if she needs anything. She was grateful for follow-up.  Plavix refill called in since it expired off med list.

## 2020-06-28 ENCOUNTER — Other Ambulatory Visit: Payer: Self-pay | Admitting: *Deleted

## 2020-06-28 NOTE — Patient Outreach (Signed)
[facility]) Care Management  06/28/2020  [name] 07-Jun-1946 [id number]  Telephone Assessment-Diabetes  RN spoke with pt today and received an update. Pt states she has not been taking her glucose readings due to her finger tips are sore and hands are very painful. States she has mentioned this to her provider on previous office visits however no resolution. Pt states she will have another follow up appointment on Friday and mention it once again.RN discussed other areas of the body pt has elect to obtain her glucose readings. Pt indicated she would try to utilize those area for her daily glucose readings. States she did not appreciate the [employer] and discontinued those meals. States she attempts to prepare her meals the best way she can. Discussed her care plan directed her her improving her ongoing management of   care with daily glucose readings and adherence with all her medications and pending medical appointments. Offered any tools or information to continue to assist pt with managing her ongoing diabetes.  Plan: Will follow up next month with pt's progress and continue to assist with improving her with managing her diabetes.   Goals Addressed              This Visit's Progress   .  to get better (pt-stated)        CARE PLAN ENTRY (see longtitudinal plan of care for additional care plan information)  Objective:  Lab Results  Component Value Date   HGBA1C 11.2 (A) 03/20/2020   HGBA1C 11.2 03/20/2020   HGBA1C 11.2 (A) 03/20/2020   HGBA1C 11.2 (A) 03/20/2020 .   Lab Results  Component Value Date   CREATININE 0.67 06/15/2020   CREATININE 0.66 05/26/2020   CREATININE 0.65 05/18/2020 .   Marland Kitchen No results found for: EGFR  Current Barriers:  Marland Kitchen Knowledge Deficits related to basic Diabetes pathophysiology and self care/management . Knowledge Deficits related to medications used for management of diabetes . Does not use cbg meter   Case Manager  Clinical Goal(s):  Over the next 90 days, patient will demonstrate improved adherence to prescribed treatment plan for diabetes self care/management as evidenced by:  Marland Kitchen Verbalize daily monitoring and recording of CBG within 30 days . Verbalize adherence to ADA/ carb modified diet within the next 30 days  Interventions:  . Provided education to patient about basic DM disease process . Discussed plans with patient for ongoing care management follow up and provided patient with direct contact information for care management team . Reviewed scheduled/upcoming provider appointments including: pending 7/23 with PCP  Patient Self Care Activities:  . Attends all scheduled provider appointments . Checks blood sugars as prescribed and utilize hyper and hypoglycemia protocol as needed  Initial goal documentation        Raina Mina, RN Care Management Coordinator [place] Office [phone]

## 2020-06-30 ENCOUNTER — Other Ambulatory Visit: Payer: Medicare Other | Admitting: *Deleted

## 2020-06-30 ENCOUNTER — Other Ambulatory Visit: Payer: Self-pay

## 2020-06-30 DIAGNOSIS — I5033 Acute on chronic diastolic (congestive) heart failure: Secondary | ICD-10-CM | POA: Diagnosis not present

## 2020-06-30 LAB — BASIC METABOLIC PANEL
BUN/Creatinine Ratio: 21 (ref 12–28)
BUN: 15 mg/dL (ref 8–27)
CO2: 25 mmol/L (ref 20–29)
Calcium: 9 mg/dL (ref 8.7–10.3)
Chloride: 100 mmol/L (ref 96–106)
Creatinine, Ser: 0.7 mg/dL (ref 0.57–1.00)
GFR calc Af Amer: 99 mL/min/{1.73_m2} (ref >59)
GFR calc non Af Amer: 86 mL/min/{1.73_m2} (ref >59)
Glucose: 225 mg/dL — ABNORMAL HIGH (ref 65–99)
Potassium: 4.3 mmol/L (ref 3.5–5.2)
Sodium: 137 mmol/L (ref 134–144)

## 2020-07-21 ENCOUNTER — Telehealth: Payer: Self-pay | Admitting: Internal Medicine

## 2020-07-21 NOTE — Telephone Encounter (Signed)
   Patient calling to report painful urination, foul smelling urine. Patient states she has no transportation to office to provide specimen  Requesting medication be prescribed

## 2020-07-24 NOTE — Telephone Encounter (Signed)
   Scheduler left message vcml to call for appt

## 2020-07-25 ENCOUNTER — Ambulatory Visit: Payer: Medicare Other | Admitting: Podiatry

## 2020-07-25 ENCOUNTER — Other Ambulatory Visit: Payer: Self-pay

## 2020-07-25 DIAGNOSIS — B351 Tinea unguium: Secondary | ICD-10-CM | POA: Diagnosis not present

## 2020-07-25 DIAGNOSIS — D689 Coagulation defect, unspecified: Secondary | ICD-10-CM

## 2020-07-25 DIAGNOSIS — M79676 Pain in unspecified toe(s): Secondary | ICD-10-CM

## 2020-07-25 DIAGNOSIS — R6889 Other general symptoms and signs: Secondary | ICD-10-CM | POA: Diagnosis not present

## 2020-07-25 NOTE — Progress Notes (Signed)
  Subjective:  Patient ID: [name], female    DOB: 1946-06-27,  MRN: [id number]  Chief Complaint  Patient presents with  . Nail Problem    Pt states it is difficult to trim her own nails and needs regular trimming.  . Nail Problem    Right 1st nail is painful, pt states someone stepped on it and the nail bed hurts, 3 month duration, history of ingrown nail.  . Foot Swelling    Pt states bilateral dorsal foot swelling.    74 y.o. female presents with the above complaint. History confirmed with patient.   Objective:  Physical Exam: warm, good capillary refill, no trophic changes or ulcerative lesions, normal DP and PT pulses and normal sensory exam. Left Foot: normal exam, no swelling, tenderness, instability; ligaments intact, full range of motion of all ankle/foot joints  Right Foot: normal exam, no swelling, tenderness, instability; ligaments intact, full range of motion of all ankle/foot joints   Assessment:   1. Pain due to onychomycosis of nail   2. Coagulation defect [facility])      Plan:  Patient was evaluated and treated and all questions answered.  Onychomycosis, Diabetes and Coagulation Defect -Patient is diabetic with a qualifying condition for at risk foot care.  Procedure: Nail Debridement Rationale: Patient meets criteria for routine foot care due to coag defect Type of Debridement: manual, sharp debridement. Instrumentation: Nail nipper, rotary burr. Number of Nails: 10  Return in about 3 months (around 10/25/2020) for Diabetic Foot Care.

## 2020-08-03 ENCOUNTER — Other Ambulatory Visit: Payer: Self-pay | Admitting: *Deleted

## 2020-08-03 NOTE — Patient Outreach (Signed)
[facility]) Care Management  08/03/2020  [name] [birth year] [id number]   Telephone Assessment-Unsuccessful  RN attempted outreach call however unsuccessful. RN able to leave a HIPAA approved voice message requesting a call back.  Plan: Will rescheduled another outreach next week for ongoing [facility] services.  Elliot Cousin, RN Care Management Coordinator [facility] Main Office [phone]

## 2020-08-06 ENCOUNTER — Other Ambulatory Visit: Payer: Self-pay | Admitting: Internal Medicine

## 2020-08-10 ENCOUNTER — Other Ambulatory Visit: Payer: Self-pay | Admitting: *Deleted

## 2020-08-10 NOTE — Patient Outreach (Signed)
[facility]) Care Management  08/10/2020  Mishti Swanton Aug 16, 1946 [id number]   Telephone Assessment-Unsuccessful  RN attempted outreach however unsuccessful. RN able to leave a HIPAA approved voice message requesting call back.  Plan: Will rescheduled another outreach over the next week for ongoing [facility] services.  Elliot Cousin, RN Care Management Coordinator [facility] Main Office [phone]

## 2020-08-16 ENCOUNTER — Other Ambulatory Visit: Payer: Self-pay | Admitting: *Deleted

## 2020-08-16 NOTE — Patient Outreach (Signed)
[facility]) Care Management  08/16/2020  Myliyah Rebuck 07-11-1946 [id number]   Telephone Assessment-Unsuccessful  RN 3rd outreach attempt unsuccessful. RN able to leave a HIPAA approved voice message requesting a call back. No response to outreach letter.  Plan: Will plan another outreach call next month for ongoing case management services.  Elliot Cousin, RN Care Management Coordinator [facility] Main Office [phone]

## 2020-09-07 ENCOUNTER — Ambulatory Visit: Payer: Self-pay | Admitting: *Deleted

## 2020-09-15 ENCOUNTER — Other Ambulatory Visit: Payer: Self-pay | Admitting: *Deleted

## 2020-09-15 NOTE — Patient Outreach (Signed)
[facility]) Care Management  09/15/2020  Audrey Thull [birth year] [id number]   Telephone Assessment-Case Closure  RN spoke with pt in detail based upon the last conversation over several months ago. RN has not been able to reach this pt. Discussion related to pt obtain glucose readings from other sites instead of her sore fingers. Pt initially agreed however did not completed or perform this task. Pt feels she is doing well and has opted to not to participate. States she does not wish to change her dietary habits or choose other areas to obtain her glucose readings. RN educated pt on the risk involved if her medical condition intensifies with no monitoring or management of care. Pt verbalized an understanding of the risk and states she no longer wishes to participate because she will not monitoring her glucose readings to other sites. She has also states she does not cook and her sister often brings her food alone with her usual intake. Pt does not wish to change her current eating habits to accomodate her diabetes.  RN offered quarterly health coach in order for to maintain available resources that maybe able to health however opt to decline.   Based upon pt's non-adherence to the current plan of care and ongoing participation with [facility] will discharge and close this case. Will also alert pt's provider of her decline of services with [facility]. Case will be closed.  Elliot Cousin, RN Care Management Coordinator [facility] Main Office [phone]

## 2020-09-21 ENCOUNTER — Other Ambulatory Visit: Payer: Self-pay

## 2020-09-21 NOTE — Patient Outreach (Signed)
[facility]) Care Management  09/21/2020  [name] October 10, 1946 [id number]   Called patient to complete satisfaction survey. Left message to call back.  Domingo Cocking [facility] Care Management Assistant [phone]

## 2020-09-25 ENCOUNTER — Encounter: Payer: Self-pay | Admitting: Cardiovascular Disease

## 2020-09-25 ENCOUNTER — Ambulatory Visit: Payer: Medicare Other | Admitting: Cardiovascular Disease

## 2020-09-25 ENCOUNTER — Other Ambulatory Visit: Payer: Self-pay

## 2020-09-25 VITALS — BP 130/70 | HR 76 | Ht 64.0 in | Wt 186.0 lb

## 2020-09-25 DIAGNOSIS — I890 Lymphedema, not elsewhere classified: Secondary | ICD-10-CM | POA: Diagnosis not present

## 2020-09-25 DIAGNOSIS — I10 Essential (primary) hypertension: Secondary | ICD-10-CM | POA: Diagnosis not present

## 2020-09-25 DIAGNOSIS — I350 Nonrheumatic aortic (valve) stenosis: Secondary | ICD-10-CM | POA: Diagnosis not present

## 2020-09-25 DIAGNOSIS — Z952 Presence of prosthetic heart valve: Secondary | ICD-10-CM

## 2020-09-25 NOTE — Patient Instructions (Signed)
Medication Instructions:  Your physician recommends that you continue on your current medications as directed. Please refer to the Current Medication list given to you today.  *If you need a refill on your cardiac medications before your next appointment, please call your pharmacy*  Lab Work: NONE  Testing/Procedures: NONE  Follow-Up: At [facility], you and your health needs are our priority.  As part of our continuing mission to provide you with exceptional heart care, we have created designated Provider Care Teams.  These Care Teams include your primary Cardiologist (physician) and Advanced Practice Providers (APPs -  Physician Assistants and Nurse Practitioners) who all work together to provide you with the care you need, when you need it.  We recommend signing up for the patient portal called "MyChart".  Sign up information is provided on this After Visit Summary.  MyChart is used to connect with patients for Virtual Visits (Telemedicine).  Patients are able to view lab/test results, encounter notes, upcoming appointments, etc.  Non-urgent messages can be sent to your provider as well.   To learn more about what you can do with MyChart, go to [email or web address].    Your next appointment:   6 month(s)  The format for your next appointment:   In Person  Provider:   You may see Chilton Si, MD or one of the following Advanced Practice Providers on your designated Care Team:    Corine Shelter, PA-C  [place], [place]  Edd Fabian, FNP  Other Instructions  WILL REACH OUT TO HOME CARE TO HAVE THEM COME WRAP YOUR LEGS

## 2020-09-25 NOTE — Addendum Note (Signed)
Addended by: Regis Bill B on: 09/25/2020 03:56 PM   Modules accepted: Orders

## 2020-09-25 NOTE — Progress Notes (Signed)
Cardiology Office Note   Date:  09/25/2020   ID:  [name], DOB [birth year], MRN [id number]  PCP:  Myrlene Broker, MD  Cardiologist:   Chilton Si, MD   No chief complaint on file.    History of Present Illness: [name] is a 74 y.o. female with hypertension, hyperlipidemia, aortic stenosis status post TAVR, DM, GERD and venous insufficiency here for follow-up.  She was initially seen 10/2015 for an evaluation of chest pain.  [name] saw Dr. Hillard Danker with a complaint of exertional chest pain.  Distinct back pain and were not exertional in nature.  She was referred for nuclear stress test 11/2015 that revealed LVEF 60% and no ischemia.  It was recommended that she follow-up in 6 months but she was not seen again until 04/2020 when she was admitted to the hospital with chest pain and elevated troponin.  High-sensitivity troponin was 149.  She underwent left heart catheterization which revealed 25% OM1 and OM2 lesions.  Her systolic function was normal.  She did have severe aortic stenosis.  Echo at that time revealed LVEF 55 to 60% with mild LVH.  Her mean aortic valve gradient by echo was 32 mmHg.  She had a 23 mm Edwards SAPIEN 3 Ultra THV TAVR implanted 05/2020.  Postop echo revealed LVEF 60% with a mean gradient of 20 mmHg and no perivalvular leak.  She last saw Carlean Jews on 06/2020 and was doing well.  She did have some lower extremity edema so furosemide was increased. NT-pro BNP was minimally elevated at 308.  She doesn't think the increased lasix helped.    She notes that her breathing is better since she had her valve replaced.  She notes that she feels lighter amd better.  She still has chronic dizziness that is unchanged. She has significant LE edema. She denies orthopnea or PND.  She goes walking every couple of days.  She is limited due to pain and gait instability. She walks with a cane.  She hasn't fallen.  She was previously on a  statin that she did not tolerate.  She tried three different statins and did not tolerate any of them.    Past Medical History:  Diagnosis Date  . Achilles tendinitis   . Acute renal failure (ARF) (HCC) 12/19/2014  . Calcaneal spur    right  . Cataract   . Depression   . Diabetes mellitus    type II  . Diverticulosis of colon (without mention of hemorrhage) 2013  . GERD (gastroesophageal reflux disease)   . GI bleed 03/26/14  . Hyperlipidemia   . Hypertension   . Internal hemorrhoids 2013  . Peripheral neuropathy   . Right shoulder pain    Subacromial tendinitis  . 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 Laneta Simmers and Excell Seltzer  . Venous insufficiency   . Ventral hernia     Past Surgical History:  Procedure Laterality Date  . ABDOMINAL HYSTERECTOMY  2001   TAH-BSO  . CHOLECYSTECTOMY  2001  . COLONOSCOPY  2013   diverticulosis   . ESOPHAGOGASTRODUODENOSCOPY  2013   normal   . INCISIONAL HERNIA REPAIR    . RIGHT/LEFT HEART CATH AND CORONARY ANGIOGRAPHY N/A 05/09/2020   Procedure: RIGHT/LEFT HEART CATH AND CORONARY ANGIOGRAPHY;  Surgeon: Corky Crafts, MD;  Location[facility] INVASIVE CV LAB;  Service: Cardiovascular;  Laterality: N/A;  .  TEE WITHOUT CARDIOVERSION N/A 05/17/2020   Procedure: TRANSESOPHAGEAL ECHOCARDIOGRAM (TEE);  Surgeon: Tonny Bollmanooper, Michael, MD;  [facility];  Service: Open Heart Surgery;  Laterality: N/A;  . TONSILLECTOMY    . TRANSCATHETER AORTIC VALVE REPLACEMENT, TRANSFEMORAL N/A 05/17/2020   Procedure: TRANSCATHETER AORTIC VALVE REPLACEMENT, TRANSFEMORAL;  Surgeon: Tonny Bollmanooper, Michael, MD;  [facility];  Service: Open Heart Surgery;  Laterality: N/A;     Current Outpatient Medications  Medication Sig Dispense Refill  . acetaminophen (TYLENOL) 325 MG tablet Take 325 mg by mouth daily as needed for mild pain or headache.    Marland Kitchen. amoxicillin (AMOXIL) 500 MG  capsule Take 4 capsules by mouth 1 hour prior to any dental work 4 capsule 3  . aspirin 81 MG tablet Take 81 mg by mouth daily.    . citalopram (CELEXA) 20 MG tablet TAKE 1 TABLET BY MOUTH  DAILY 90 tablet 1  . clopidogrel (PLAVIX) 75 MG tablet Take 1 tablet (75 mg total) by mouth daily. 90 tablet 1  . Continuous Blood Gluc Sensor (FREESTYLE LIBRE SENSOR SYSTEM) MISC Use to monitor sugars 1 each 0  . dicyclomine (BENTYL) 20 MG tablet Take 20 mg by mouth 3 (three) times daily.    Marland Kitchen. esomeprazole (NEXIUM) 20 MG capsule Take 1 capsule (20 mg total) by mouth daily at 12 noon. 90 capsule 3  . ezetimibe (ZETIA) 10 MG tablet Take 1 tablet (10 mg total) by mouth daily. 90 tablet 3  . furosemide (LASIX) 40 MG tablet Take 1 tablet (40 mg total) by mouth daily. 90 tablet 3  . gabapentin (NEURONTIN) 300 MG capsule TAKE 1 CAPSULE BY MOUTH 3  TIMES DAILY 90 capsule 5  . glucose blood (ONETOUCH VERIO) test strip Use as instructed 150 each 12  . Insulin Lispro Prot & Lispro (HUMALOG MIX 75/25 KWIKPEN) (75-25) 100 UNIT/ML Kwikpen Inject 44 Units into the skin 2 (two) times daily. (Patient taking differently: Inject 45 Units into the skin 2 (two) times daily. ) 30 mL 6  . Insulin Pen Needle 31G X 8 MM MISC Use to inject insulin four times daily E11.41 100 each 3  . lisinopril (ZESTRIL) 20 MG tablet Take 20 mg by mouth daily.    . metFORMIN (GLUCOPHAGE) 1000 MG tablet Take 0.5 tablets (500 mg total) by mouth 2 (two) times daily with a meal. 90 tablet 3  . metoprolol tartrate (LOPRESSOR) 25 MG tablet Take 0.5 tablets (12.5 mg total) by mouth 2 (two) times daily. 90 tablet 2  . sodium chloride (OCEAN) 0.65 % SOLN nasal spray Place 1 spray into both nostrils 4 (four) times daily. (Patient taking differently: Place 1 spray into both nostrils as needed for congestion. ) 30 mL 2  . tetrahydrozoline 0.05 % ophthalmic solution Place 1-2 drops into both eyes 2 (two) times daily as needed (Dry eyes).    Terrance Mass. Trolamine Salicylate  (ASPERCREME EX) Apply 1 application topically daily as needed (shoulder/hips/knee/knuckles).    Marland Kitchen. atorvastatin (LIPITOR) 40 MG tablet Take 1 tablet (40 mg total) by mouth daily. 90 tablet 3   No current facility-administered medications for this visit.    Allergies:   Morphine, Codeine sulfate, Demerol [meperidine], Meperidine hcl, and Propoxyphene hcl    Social History:  The patient  reports that she has never smoked. She has never used smokeless tobacco. She reports that she does not drink alcohol and does not use drugs.   Family History:  The patient's family history includes Breast cancer  in her paternal aunt; CVA in her father; Diabetes in her paternal grandmother and sister; Heart failure in her father and mother; Other in her brother and sister; Ovarian cancer in her sister.    ROS:  Please see the history of present illness.   Otherwise, review of systems are positive for none.   All other systems are reviewed and negative.    PHYSICAL EXAM: VS:  BP 130/70   Pulse 76   Ht 5\' 4"  (1.626 m)   Wt 186 lb (84.4 kg)   SpO2 97%   BMI 31.93 kg/m  , BMI Body mass index is 31.93 kg/m. GENERAL:  Well appearing HEENT:  Pupils equal round and reactive, fundi not visualized, oral mucosa unremarkable NECK:  No jugular venous distention, waveform within normal limits, carotid upstroke brisk and symmetric, no bruits, no thyromegaly LYMPHATICS:  No cervical adenopathy LUNGS:  Clear to auscultation bilaterally HEART:  RRR.  PMI not displaced or sustained,S1 and S2 within normal limits, no S3, no S4, no clicks, no rubs, II/VI systolic murmurs ABD:  Flat, positive bowel sounds normal in frequency in pitch, no bruits, no rebound, no guarding, no midline pulsatile mass, no hepatomegaly, no splenomegaly EXT:  2 plus pulses throughout, no edema, no cyanosis no clubbing SKIN:  No rashes no nodules NEURO:  Cranial nerves II through XII grossly intact, motor grossly intact throughout PSYCH:   Cognitively intact, oriented to person place and time   EKG:  EKG is ordered today. The ekg ordered today demonstrates sinus rhythm rate 84 bpm.  LAFB.  Lexiscan Myoview 11/2015:  The left ventricular ejection fraction is normal (55-65%).  Nuclear stress EF: 60%.  There was no ST segment deviation noted during stress.  The study is normal.   Normal stress nuclear study with no ischemia or infarction; EF 60 with normal wall motion.  LHC 05/2020:  1st Mrg lesion is 25% stenosed.  2nd Mrg lesion is 25% stenosed.  The left ventricular systolic function is normal.  LV end diastolic pressure is normal.  The left ventricular ejection fraction is 55-65% by visual estimate.  There is severe aortic valve stenosis. Calculated aortic valve area 0.76 cm.  Aortic saturation 99%, PA saturation 68%, mean PA pressure 12 mmHg, mean PCWP 6 mmHg. Cardiac output 4.0 L/min. Cardiac index 2.16.   Nonobstructive coronary artery disease.  Severe aortic stenosis.  Echo 06/15/20:  1. Definity used; normal LV systolic function; grade 2 diastolic  dysfunction; mild LVH; s/p TAVR with mean gradient 12 mmHg, AVA 1.1 cm2  and no AI.  2. Left ventricular ejection fraction, by estimation, is 60 to 65%. The  left ventricle has normal function. The left ventricle has no regional  wall motion abnormalities. There is mild left ventricular hypertrophy.  Left ventricular diastolic parameters  are consistent with Grade II diastolic dysfunction (pseudonormalization).  Elevated left atrial pressure.  3. Right ventricular systolic function is normal. The right ventricular  size is normal.  4. The mitral valve is normal in structure. Trivial mitral valve  regurgitation. No evidence of mitral stenosis.  5. The aortic valve has been repaired/replaced. Aortic valve  regurgitation is not visualized. No aortic stenosis is present. There is a  bioprosthetic valve present in the aortic position.  6. The  inferior vena cava is normal in size with greater than 50%  respiratory variability, suggesting right atrial pressure of 3 mmHg.    Recent Labs: 05/11/2020: B Natriuretic Peptide 72.9 05/14/2020: TSH 4.000 05/18/2020: Magnesium 1.6 05/26/2020:  ALT 34; Hemoglobin 13.4; Platelets 225.0 06/15/2020: NT-Pro BNP 308 06/30/2020: BUN 15; Creatinine, Ser 0.70; Potassium 4.3; Sodium 137    Lipid Panel    Component Value Date/Time   CHOL 194 03/20/2020 1157   TRIG 170.0 (H) 03/20/2020 1157   HDL 36.30 (L) 03/20/2020 1157   CHOLHDL 5 03/20/2020 1157   VLDL 34.0 03/20/2020 1157   LDLCALC 124 (H) 03/20/2020 1157   LDLDIRECT 110.0 11/12/2018 1419      Wt Readings from Last 3 Encounters:  09/25/20 186 lb (84.4 kg)  06/15/20 192 lb 6 oz (87.3 kg)  05/26/20 197 lb (89.4 kg)      ASSESSMENT AND PLAN:  # s/p TAVR:  Ms. Gorton is doing well.  She will continue on Plavix through 12/9.  At that time she will continue just aspirin alone.  She needs antibiotics for dental procedures.  # Hypertension:  Blood pressure well-controlled.  Continue lisinopril.  # Hyperlipidemia:  She has not tolerated several different statins in the past.She is now on Zetia and atorvastatin.    Current medicines are reviewed at length with the patient today.  The patient does not have concerns regarding medicines.  The following changes have been made:  Start Zetia  Labs/ tests ordered today include:   Orders Placed This Encounter  Procedures  . Apply unna boot     Disposition:   FU with Chas Axel C. Duke Salvia, MD in 6 months.   Signed, Chilton Si, MD  09/25/2020 10:46 AM    [facility]

## 2020-10-05 ENCOUNTER — Telehealth: Payer: Self-pay | Admitting: Cardiovascular Disease

## 2020-10-05 NOTE — Telephone Encounter (Signed)
[name] is calling wanting more information on the leg wraps Dr. Duke Salvia was discussing with her at her last appt. Please advise.

## 2020-10-05 NOTE — Telephone Encounter (Signed)
Patient concerned about cost of compression pump Will call Monday consultant Monday when return to office

## 2020-10-09 NOTE — Telephone Encounter (Signed)
Left message to call back  

## 2020-10-10 DIAGNOSIS — R6889 Other general symptoms and signs: Secondary | ICD-10-CM | POA: Diagnosis not present

## 2020-10-18 ENCOUNTER — Telehealth: Payer: Self-pay | Admitting: Cardiovascular Disease

## 2020-10-18 NOTE — Telephone Encounter (Signed)
Corinna from [employer] calling to see if an order for an Unna boot was received. She states to call [phone] option 2

## 2020-10-18 NOTE — Telephone Encounter (Signed)
Spoke with [employer]. Refaxed [employer] and O2 order. Confirmation received.

## 2020-10-23 DIAGNOSIS — I350 Nonrheumatic aortic (valve) stenosis: Secondary | ICD-10-CM | POA: Diagnosis not present

## 2020-10-23 DIAGNOSIS — I890 Lymphedema, not elsewhere classified: Secondary | ICD-10-CM | POA: Diagnosis not present

## 2020-10-25 ENCOUNTER — Ambulatory Visit: Payer: Medicare Other | Admitting: Podiatry

## 2020-10-30 ENCOUNTER — Encounter (HOSPITAL_BASED_OUTPATIENT_CLINIC_OR_DEPARTMENT_OTHER): Payer: Self-pay

## 2020-10-30 ENCOUNTER — Emergency Department (HOSPITAL_BASED_OUTPATIENT_CLINIC_OR_DEPARTMENT_OTHER)
Admission: EM | Admit: 2020-10-30 | Discharge: 2020-10-30 | Disposition: A | Discharge status: Home / Self Care | Payer: Medicare Other | Attending: Emergency Medicine | Admitting: Emergency Medicine

## 2020-10-30 ENCOUNTER — Other Ambulatory Visit: Payer: Self-pay

## 2020-10-30 ENCOUNTER — Emergency Department (HOSPITAL_BASED_OUTPATIENT_CLINIC_OR_DEPARTMENT_OTHER): Payer: Medicare Other

## 2020-10-30 DIAGNOSIS — E1169 Type 2 diabetes mellitus with other specified complication: Secondary | ICD-10-CM | POA: Insufficient documentation

## 2020-10-30 DIAGNOSIS — Z7984 Long term (current) use of oral hypoglycemic drugs: Secondary | ICD-10-CM | POA: Insufficient documentation

## 2020-10-30 DIAGNOSIS — Z79899 Other long term (current) drug therapy: Secondary | ICD-10-CM | POA: Insufficient documentation

## 2020-10-30 DIAGNOSIS — I10 Essential (primary) hypertension: Secondary | ICD-10-CM | POA: Insufficient documentation

## 2020-10-30 DIAGNOSIS — R600 Localized edema: Secondary | ICD-10-CM | POA: Diagnosis present

## 2020-10-30 DIAGNOSIS — Z7901 Long term (current) use of anticoagulants: Secondary | ICD-10-CM | POA: Insufficient documentation

## 2020-10-30 DIAGNOSIS — I890 Lymphedema, not elsewhere classified: Secondary | ICD-10-CM | POA: Diagnosis not present

## 2020-10-30 DIAGNOSIS — Z952 Presence of prosthetic heart valve: Secondary | ICD-10-CM | POA: Diagnosis not present

## 2020-10-30 DIAGNOSIS — Z794 Long term (current) use of insulin: Secondary | ICD-10-CM | POA: Diagnosis not present

## 2020-10-30 DIAGNOSIS — Z7982 Long term (current) use of aspirin: Secondary | ICD-10-CM | POA: Insufficient documentation

## 2020-10-30 DIAGNOSIS — M7989 Other specified soft tissue disorders: Secondary | ICD-10-CM | POA: Diagnosis not present

## 2020-10-30 LAB — CBC WITH DIFFERENTIAL/PLATELET
Abs Immature Granulocytes: 0.03 10*3/uL (ref 0.00–0.07)
Basophils Absolute: 0.1 10*3/uL (ref 0.0–0.1)
Basophils Relative: 1 %
Eosinophils Absolute: 0.4 10*3/uL (ref 0.0–0.5)
Eosinophils Relative: 5 %
HCT: 42.4 % (ref 36.0–46.0)
Hemoglobin: 14.2 g/dL (ref 12.0–15.0)
Immature Granulocytes: 0 %
Lymphocytes Relative: 28 %
Lymphs Abs: 2.2 10*3/uL (ref 0.7–4.0)
MCH: 27.7 pg (ref 26.0–34.0)
MCHC: 33.5 g/dL (ref 30.0–36.0)
MCV: 82.8 fL (ref 80.0–100.0)
Monocytes Absolute: 0.6 10*3/uL (ref 0.1–1.0)
Monocytes Relative: 7 %
Neutro Abs: 4.6 10*3/uL (ref 1.7–7.7)
Neutrophils Relative %: 59 %
Platelets: 228 10*3/uL (ref 150–400)
RBC: 5.12 MIL/uL — ABNORMAL HIGH (ref 3.87–5.11)
RDW: 13.3 % (ref 11.5–15.5)
WBC: 7.8 10*3/uL (ref 4.0–10.5)
nRBC: 0 % (ref 0.0–0.2)

## 2020-10-30 LAB — COMPREHENSIVE METABOLIC PANEL
ALT: 29 U/L (ref 0–44)
AST: 21 U/L (ref 15–41)
Albumin: 3.7 g/dL (ref 3.5–5.0)
Alkaline Phosphatase: 149 U/L — ABNORMAL HIGH (ref 38–126)
Anion gap: 9 (ref 5–15)
BUN: 20 mg/dL (ref 8–23)
CO2: 27 mmol/L (ref 22–32)
Calcium: 9.1 mg/dL (ref 8.9–10.3)
Chloride: 98 mmol/L (ref 98–111)
Creatinine, Ser: 0.64 mg/dL (ref 0.44–1.00)
GFR, Estimated: >60 mL/min (ref >60)
Glucose, Bld: 258 mg/dL — ABNORMAL HIGH (ref 70–99)
Potassium: 4.1 mmol/L (ref 3.5–5.1)
Sodium: 134 mmol/L — ABNORMAL LOW (ref 135–145)
Total Bilirubin: 0.5 mg/dL (ref 0.3–1.2)
Total Protein: 8.2 g/dL — ABNORMAL HIGH (ref 6.5–8.1)

## 2020-10-30 LAB — TROPONIN I (HIGH SENSITIVITY): Troponin I (High Sensitivity): 4 ng/L (ref <18)

## 2020-10-30 LAB — BRAIN NATRIURETIC PEPTIDE: B Natriuretic Peptide: 40.7 pg/mL (ref 0.0–100.0)

## 2020-10-30 MED ORDER — POTASSIUM CHLORIDE ER 10 MEQ PO TBCR: 10.0000 meq | EXTENDED_RELEASE_TABLET | Freq: Two times a day (BID) | ORAL | 0 refills | Status: DC

## 2020-10-30 NOTE — Discharge Instructions (Signed)
Take Lasix 40 mg twice a day for the next 4 days.  Take the potassium during this time also.  Follow-up with your doctors for further management of the swelling.

## 2020-10-30 NOTE — ED Provider Notes (Signed)
[facility] EMERGENCY DEPARTMENT Provider Note   CSN: [id number] Arrival date & time: 10/30/20  1141     History Chief Complaint  Patient presents with   Wound Check    [name] is a 74 y.o. female.  HPI Patient presents with edema in both her lower legs.  History of chronic lymphedema.  Has been on adjusting dose of Lasix.  States she had been on 20 mg of Lasix twice a day.  States they change that to 40 mg once a day.  States continued swelling the legs.  States she does not urinate much.  States there is some draining on the left lower leg.  No fevers.  No chest pain no real trouble breathing.  States she worried that her kidneys may have failed.  Has had previous TAVR.  No fevers or chills.  Patient states she cannot wear pressure stockings because she does not have a hand strength to be able to pull them on..    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   Syncope 05/11/2020   Pressure injury of skin 05/11/2020   Orthostatic hypotension 05/11/2020   Dental caries 05/11/2020   Aortic stenosis    Non-ST elevation myocardial infarction (NSTEMI) (HCC) 05/05/2020   NSTEMI (non-ST  elevated myocardial infarction) (HCC) 05/05/2020   Diabetic neuropathy (HCC) 11/13/2018   Left knee pain 10/24/2017   Greater trochanteric bursitis of left hip 10/24/2017   AKI (acute kidney injury) (HCC) 12/19/2014   Reactive airway disease 04/09/2014   Seasonal allergic rhinitis 04/09/2014   Preventative health care 04/09/2014   Abnormality of gait 03/17/2013   Irritable bowel syndrome (IBS) 08/27/2012   Type II diabetes mellitus with neurological manifestations, 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   GERD 06/18/2007   Hyperlipidemia associated with type 2 diabetes mellitus (HCC) 12/27/2006   Essential hypertension 09/19/2006   Venous (peripheral) insufficiency 09/19/2006    Past Surgical History:  Procedure Laterality Date   ABDOMINAL HYSTERECTOMY  2001   TAH-BSO   CHOLECYSTECTOMY  2001   COLONOSCOPY  2013   diverticulosis    ESOPHAGOGASTRODUODENOSCOPY  2013   normal    INCISIONAL HERNIA REPAIR     RIGHT/LEFT HEART CATH AND CORONARY ANGIOGRAPHY N/A 05/09/2020   Procedure: RIGHT/LEFT HEART CATH AND CORONARY ANGIOGRAPHY;  Surgeon: Corky Crafts, MD;  Location: [facility] INVASIVE CV LAB;  Service: Cardiovascular;  Laterality: N/A;   TEE WITHOUT CARDIOVERSION N/A 05/17/2020   Procedure: TRANSESOPHAGEAL ECHOCARDIOGRAM (TEE);  Surgeon: Tonny Bollman, MD;  Location: [facility] INVASIVE CV LAB;  Service: Open Heart Surgery;  Laterality: N/A;   TONSILLECTOMY  TRANSCATHETER AORTIC VALVE REPLACEMENT, TRANSFEMORAL N/A 05/17/2020   Procedure: TRANSCATHETER AORTIC VALVE REPLACEMENT, TRANSFEMORAL;  Surgeon: Tonny Bollman, MD;  Location: [facility] INVASIVE CV LAB;  Service: Open Heart Surgery;  Laterality: N/A;     OB History   No obstetric history on file.     Family History  Problem Relation Age of Onset   Heart failure Father        Died in 21s   CVA Father    Ovarian cancer Sister    Diabetes Sister    Other Sister        died at birth   Heart failure Mother         Died in 95s   Other Brother        drowned   Diabetes Paternal Grandmother    Breast cancer Paternal Aunt    Colon cancer Neg Hx     Social History   Tobacco Use   Smoking status: Never Smoker   Smokeless tobacco: Never Used  Substance Use Topics   Alcohol use: No   Drug use: No    Home Medications Prior to Admission medications   Medication Sig Start Date End Date Taking? Authorizing Provider  acetaminophen (TYLENOL) 325 MG tablet Take 325 mg by mouth daily as needed for mild pain or headache.    [provider]  amoxicillin (AMOXIL) 500 MG capsule Take 4 capsules by mouth 1 hour prior to any dental work 06/15/20   Janetta Hora, PA-C  aspirin 81 MG tablet Take 81 mg by mouth daily.    [provider]  atorvastatin (LIPITOR) 40 MG tablet Take 1 tablet (40 mg total) by mouth daily. 03/27/20 06/25/20  Marjie Skiff E, PA-C  citalopram (CELEXA) 20 MG tablet TAKE 1 TABLET BY MOUTH  DAILY 08/07/20   Myrlene Broker, MD  clopidogrel (PLAVIX) 75 MG tablet Take 1 tablet (75 mg total) by mouth daily. 06/23/20   Janetta Hora, PA-C  Continuous Blood Gluc Sensor (FREESTYLE LIBRE SENSOR SYSTEM) MISC Use to monitor sugars 03/20/20   Myrlene Broker, MD  dicyclomine (BENTYL) 20 MG tablet Take 20 mg by mouth 3 (three) times daily. 06/05/20   [provider]  esomeprazole (NEXIUM) 20 MG capsule Take 1 capsule (20 mg total) by mouth daily at 12 noon. 03/07/20   Myrlene Broker, MD  ezetimibe (ZETIA) 10 MG tablet Take 1 tablet (10 mg total) by mouth daily. 03/20/20   Myrlene Broker, MD  furosemide (LASIX) 40 MG tablet Take 1 tablet (40 mg total) by mouth daily. 06/15/20   Janetta Hora, PA-C  gabapentin (NEURONTIN) 300 MG capsule TAKE 1 CAPSULE BY MOUTH 3  TIMES DAILY 06/20/20   Myrlene Broker, MD  glucose blood [facility] VERIO) test strip Use as instructed 03/07/20   Myrlene Broker, MD  Insulin Lispro Prot & Lispro  (HUMALOG MIX 75/25 KWIKPEN) (75-25) 100 UNIT/ML Kwikpen Inject 44 Units into the skin 2 (two) times daily. Patient taking differently: Inject 45 Units into the skin 2 (two) times daily.  08/30/19   Shamleffer, Konrad Dolores, MD  Insulin Pen Needle 31G X 8 MM MISC Use to inject insulin four times daily E11.41 04/06/15   Myrlene Broker, MD  lisinopril (ZESTRIL) 20 MG tablet Take 20 mg by mouth daily. 06/06/20   [provider]  metFORMIN (GLUCOPHAGE) 1000 MG tablet Take 0.5 tablets (500 mg total) by mouth 2 (two) times daily with a meal. 03/07/20  Myrlene Broker, MD  metoprolol tartrate (LOPRESSOR) 25 MG tablet Take 0.5 tablets (12.5 mg total) by mouth 2 (two) times daily. 06/06/20   Arty Baumgartner, NP  potassium chloride (KLOR-CON) 10 MEQ tablet Take 1 tablet (10 mEq total) by mouth 2 (two) times daily. 10/30/20   Benjiman Core, MD  sodium chloride (OCEAN) 0.65 % SOLN nasal spray Place 1 spray into both nostrils 4 (four) times daily. Patient taking differently: Place 1 spray into both nostrils as needed for congestion.  03/07/20   Myrlene Broker, MD  tetrahydrozoline 0.05 % ophthalmic solution Place 1-2 drops into both eyes 2 (two) times daily as needed (Dry eyes).    [provider]  Trolamine Salicylate (ASPERCREME EX) Apply 1 application topically daily as needed (shoulder/hips/knee/knuckles).    [provider]    Allergies    Morphine, Codeine sulfate, Demerol [meperidine], Meperidine hcl, and Propoxyphene hcl  Review of Systems   Review of Systems  Constitutional: Negative for appetite change.  HENT: Negative for congestion.   Respiratory: Negative for shortness of breath.   Cardiovascular: Positive for leg swelling.  Gastrointestinal: Negative for abdominal pain.  Genitourinary: Negative for dysuria.  Musculoskeletal: Negative for back pain.  Skin: Positive for wound.  Neurological: Negative for weakness.  Psychiatric/Behavioral:  Negative for confusion.    Physical Exam Updated Vital Signs BP (!) 126/52 (BP Location: Right Arm)    Pulse 79    Temp 97.9 F (36.6 C) (Oral)    Resp 18    Ht 5\' 4"  (1.626 m)    Wt 87.4 kg    SpO2 100%    BMI 33.06 kg/m   Physical Exam Vitals and nursing note reviewed.  HENT:     Head: Normocephalic.  Eyes:     Extraocular Movements: Extraocular movements intact.     Pupils: Pupils are equal, round, and reactive to light.  Neck:     Comments: No JVD. Cardiovascular:     Rate and Rhythm: Regular rhythm.     Heart sounds: Murmur heard.      Comments: Systolic murmur. Pulmonary:     Breath sounds: No wheezing, rhonchi or rales.  Abdominal:     Tenderness: There is no abdominal tenderness.  Musculoskeletal:     Cervical back: Neck supple.     Right lower leg: Edema present.     Left lower leg: Edema present.     Comments: Moderate pitting edema bilateral lower extremities.  Some chronic venous changes.  No induration.  Minimal erythema.  No fluctuance.  Few petechiae scattered also.  Skin:    General: Skin is warm.     Capillary Refill: Capillary refill takes less than 2 seconds.  Neurological:     Mental Status: She is alert and oriented to person, place, and time.     ED Results / Procedures / Treatments   Labs (all labs ordered are listed, but only abnormal results are displayed) Labs Reviewed  COMPREHENSIVE METABOLIC PANEL - Abnormal; Notable for the following components:      Result Value   Sodium 134 (*)    Glucose, Bld 258 (*)    Total Protein 8.2 (*)    Alkaline Phosphatase 149 (*)    All other components within normal limits  CBC WITH DIFFERENTIAL/PLATELET - Abnormal; Notable for the following components:   RBC 5.12 (*)    All other components within normal limits  BRAIN NATRIURETIC PEPTIDE  TROPONIN I (HIGH SENSITIVITY)    EKG EKG  Interpretation  Date/Time:  Monday October 30 2020 12:18:11 EST Ventricular Rate:  85 PR Interval:    QRS  Duration: 97 QT Interval:  379 QTC Calculation: 451 R Axis:   -33 Text Interpretation: Sinus rhythm Left axis deviation Low voltage, precordial leads Confirmed by Benjiman CorePickering, Jene Oravec [phone]([id number]) on 10/30/2020 2:32:31 PM   Radiology DG Chest Portable 1 View  Result Date: 10/30/2020 CLINICAL DATA:  Lower extremity swelling. EXAM: PORTABLE CHEST 1 VIEW COMPARISON:  None. FINDINGS: The heart size and mediastinal contours are within normal limits. Cardiac valve replacement. Both lungs are clear. No visible pleural effusions or pneumothorax. The visualized skeletal structures are unremarkable. IMPRESSION: No acute cardiopulmonary disease. Electronically Signed   By: Feliberto HartsFrederick S Jones MD   On: 10/30/2020 12:31    Procedures Procedures (including critical care time)  Medications Ordered in ED Medications - No data to display  ED Course  I have reviewed the triage vital signs and the nursing notes.  Pertinent labs & imaging results that were available during my care of the patient were reviewed by me and considered in my medical decision making (see chart for details).    MDM Rules/Calculators/A&P                          Patient with edema of both lower legs.  BNP reassuring.  Chest x-ray reassuring.  Has been on Lasix without relief.  Patient states it has been better in the past with Lasix when I got her weight down.  Will increase her Lasix dose up to 40 twice day for the next 4 days to see if that helps.  We will also add potassium.  Outpatient follow-up with PCP and cardiology.  Doubt DVT.  Kidney function looks good. Final Clinical Impression(s) / ED Diagnoses Final diagnoses:  Lymphedema    Rx / DC Orders ED Discharge Orders         Ordered    potassium chloride (KLOR-CON) 10 MEQ tablet  2 times daily        10/30/20 1447           Benjiman CorePickering, Keilynn Marano, MD 10/30/20 [phone]

## 2020-10-30 NOTE — ED Triage Notes (Signed)
Pt arrives with wounds to left leg that have been draining. States BLE have been swelling more over the past month. Increased dose of lasix recently.

## 2020-11-20 ENCOUNTER — Other Ambulatory Visit: Payer: Self-pay | Admitting: Internal Medicine

## 2020-11-20 DIAGNOSIS — E1149 Type 2 diabetes mellitus with other diabetic neurological complication: Secondary | ICD-10-CM

## 2020-11-20 DIAGNOSIS — IMO0002 Reserved for concepts with insufficient information to code with codable children: Secondary | ICD-10-CM

## 2020-11-22 NOTE — Telephone Encounter (Signed)
Last OV: 05/26/20

## 2020-11-27 ENCOUNTER — Telehealth: Payer: Self-pay | Admitting: Internal Medicine

## 2020-11-27 NOTE — Progress Notes (Signed)
°  Chronic Care Management   Note  11/27/2020 Name: [name] MRN: [id number] DOB: Dec 05, 1946  [name] is a 74 y.o. year old female who is a primary care patient of Myrlene Broker, MD. I reached out to Marykay Lex by phone today in response to a referral sent by [name]'s PCP, Myrlene Broker, MD.   [name] was given information about Chronic Care Management services today including:  1. CCM service includes personalized support from designated clinical staff supervised by her physician, including individualized plan of care and coordination with other care providers 2. 24/7 contact phone numbers for assistance for urgent and routine care needs. 3. Service will only be billed when office clinical staff spend 20 minutes or more in a month to coordinate care. 4. Only one practitioner may furnish and bill the service in a calendar month. 5. The patient may stop CCM services at any time (effective at the end of the month) by phone call to the office staff.   Patient agreed to services and verbal consent obtained.   Follow up plan:   Carley Perdue [facility] Scheduler

## 2021-01-04 DIAGNOSIS — H532 Diplopia: Secondary | ICD-10-CM | POA: Diagnosis not present

## 2021-01-04 DIAGNOSIS — R6889 Other general symptoms and signs: Secondary | ICD-10-CM | POA: Diagnosis not present

## 2021-01-16 ENCOUNTER — Other Ambulatory Visit: Payer: Self-pay | Admitting: Internal Medicine

## 2021-01-16 DIAGNOSIS — E1149 Type 2 diabetes mellitus with other diabetic neurological complication: Secondary | ICD-10-CM

## 2021-01-16 DIAGNOSIS — IMO0002 Reserved for concepts with insufficient information to code with codable children: Secondary | ICD-10-CM

## 2021-01-18 ENCOUNTER — Telehealth: Payer: Self-pay | Admitting: Pharmacist

## 2021-01-18 NOTE — Progress Notes (Signed)
Chronic Care Management Pharmacy Assistant   Name: [name]  MRN: [id number] DOB: January 10, 1946  Reason for Encounter: Initial Questions   PCP : Myrlene Broker, MD  Allergies:   Allergies  Allergen Reactions   Morphine Other (See Comments)    'took me out of this world' I had to be resuscitated   Codeine Sulfate Other (See Comments)    GI upset and pain   Demerol [Meperidine] Nausea And Vomiting   Meperidine Hcl Nausea And Vomiting and Swelling   Propoxyphene Hcl Other (See Comments)    Darvocet caused sick headache    Medications: Outpatient Encounter Medications as of 01/18/2021  Medication Sig   acetaminophen (TYLENOL) 325 MG tablet Take 325 mg by mouth daily as needed for mild pain or headache.   amoxicillin (AMOXIL) 500 MG capsule Take 4 capsules by mouth 1 hour prior to any dental work   aspirin 81 MG tablet Take 81 mg by mouth daily.   atorvastatin (LIPITOR) 40 MG tablet Take 1 tablet (40 mg total) by mouth daily.   citalopram (CELEXA) 20 MG tablet TAKE 1 TABLET BY MOUTH  DAILY   clopidogrel (PLAVIX) 75 MG tablet Take 1 tablet (75 mg total) by mouth daily.   Continuous Blood Gluc Sensor (FREESTYLE LIBRE SENSOR SYSTEM) MISC Use to monitor sugars   dicyclomine (BENTYL) 20 MG tablet Take 20 mg by mouth 3 (three) times daily.   esomeprazole (NEXIUM) 20 MG capsule Take 1 capsule (20 mg total) by mouth daily at 12 noon.   ezetimibe (ZETIA) 10 MG tablet Take 1 tablet (10 mg total) by mouth daily. Please call our office to schedule a follow up to receive additional refills.   furosemide (LASIX) 40 MG tablet Take 1 tablet (40 mg total) by mouth daily.   gabapentin (NEURONTIN) 300 MG capsule TAKE 1 CAPSULE BY MOUTH 3  TIMES DAILY   glucose blood (ONETOUCH VERIO) test strip Use as instructed   Insulin Lispro Prot & Lispro (HUMALOG MIX 75/25 KWIKPEN) (75-25) 100 UNIT/ML Kwikpen Inject 44 Units into the skin 2 (two) times daily. (Patient taking  differently: Inject 45 Units into the skin 2 (two) times daily. )   Insulin Pen Needle 31G X 8 MM MISC Use to inject insulin four times daily E11.41   lisinopril (ZESTRIL) 20 MG tablet Take 20 mg by mouth daily.   metFORMIN (GLUCOPHAGE) 1000 MG tablet TAKE ONE-HALF TABLET BY  MOUTH TWICE DAILY WITH A  MEAL   metoprolol tartrate (LOPRESSOR) 25 MG tablet Take 0.5 tablets (12.5 mg total) by mouth 2 (two) times daily.   potassium chloride (KLOR-CON) 10 MEQ tablet Take 1 tablet (10 mEq total) by mouth 2 (two) times daily.   sodium chloride (OCEAN) 0.65 % SOLN nasal spray Place 1 spray into both nostrils 4 (four) times daily. (Patient taking differently: Place 1 spray into both nostrils as needed for congestion. )   tetrahydrozoline 0.05 % ophthalmic solution Place 1-2 drops into both eyes 2 (two) times daily as needed (Dry eyes).   Trolamine Salicylate (ASPERCREME EX) Apply 1 application topically daily as needed (shoulder/hips/knee/knuckles).   No facility-administered encounter medications on file as of 01/18/2021.    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   Syncope 05/11/2020   Pressure injury of skin 05/11/2020   Orthostatic hypotension 05/11/2020   Dental caries 05/11/2020   Aortic stenosis  Non-ST elevation myocardial infarction (NSTEMI) (HCC) 05/05/2020   NSTEMI (non-ST elevated myocardial infarction) (HCC) 05/05/2020   Diabetic neuropathy (HCC) 11/13/2018   Left knee pain 10/24/2017   Greater trochanteric bursitis of left hip 10/24/2017   AKI (acute kidney injury) (HCC) 12/19/2014   Reactive airway disease 04/09/2014   Seasonal allergic rhinitis 04/09/2014   Preventative health care 04/09/2014   Abnormality of gait 03/17/2013   Irritable bowel syndrome (IBS) 08/27/2012   Type II diabetes mellitus with neurological  manifestations, 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   GERD 06/18/2007   Hyperlipidemia associated with type 2 diabetes mellitus (HCC) 12/27/2006   Essential hypertension 09/19/2006   Venous (peripheral) insufficiency 09/19/2006    Goals Addressed   None     Follow-Up:  Pharmacist Review   Have you seen any other providers since your last visit? The patient states that she has seen her cardiologist and her podiatrist  Any changes in your medications or health? The patient states that she is taking lasix 40 mg daily but because of the swelling she started to take 40 mg twice a day but it still does not seem to help. Also she states that she is taking Plavix but was told by Dr. Duke Salvia that she may not need to continue it.  Any side effects from any medications? The patient states that she does not believe that she has had any side effects from medications  Do you have an symptoms or problems not managed by your medications? The patient states that she is symptoms not managed by medications  Any concerns about your health right now? The patient states that she has a few concerns about her health. She states that her legs are swelling and hurt really bad when she is trying to walk. Also she states that she has been urinating on herself lately because she can't hold it to get to the bathroom. She said that her legs swells so bad that they have begun to leak. She states that she tries to wrap them when she can. She states that Dr. Duke Salvia told her she may need something else besides the Lasix that would help with the swelling but she has not heard anything else about it  Has your provider asked that you check blood pressure, blood sugar, or follow special diet at home? The patient states that she has not taken her blood pressure or sugar in a while and that she is suppose to stick to a diabetic  recommendations   Do you get any type of exercise on a regular basis? The patient states that she  can't exercise at this time because of the swelling in legs. She states that it is hard for her to even get around her home  Can you think of a goal you would like to reach for your health? The patient states that she does not have a health goal at this time   Do you have any problems getting your medications? The patient states that she gets her medications from mail order and that she has missed taking some of them  Is there anything that you would like to discuss during the appointment? The patient states that the only thing she would like to discuss is the swelling in her legs and whether she she continue with Plavix or not  Please bring medications and supplements to appointment: The patient states that she will have a list of her medications with her while waiting on a phone call from the clinical pharmacist  Horald Pollen, Clinical Pharmacist Assistant [facility] [phone]

## 2021-01-19 ENCOUNTER — Ambulatory Visit (INDEPENDENT_AMBULATORY_CARE_PROVIDER_SITE_OTHER): Payer: Medicare Other | Admitting: Pharmacist

## 2021-01-19 ENCOUNTER — Other Ambulatory Visit: Payer: Self-pay

## 2021-01-19 DIAGNOSIS — E1142 Type 2 diabetes mellitus with diabetic polyneuropathy: Secondary | ICD-10-CM | POA: Diagnosis not present

## 2021-01-19 DIAGNOSIS — E1165 Type 2 diabetes mellitus with hyperglycemia: Secondary | ICD-10-CM | POA: Diagnosis not present

## 2021-01-19 DIAGNOSIS — E1149 Type 2 diabetes mellitus with other diabetic neurological complication: Secondary | ICD-10-CM

## 2021-01-19 DIAGNOSIS — E1169 Type 2 diabetes mellitus with other specified complication: Secondary | ICD-10-CM

## 2021-01-19 DIAGNOSIS — E785 Hyperlipidemia, unspecified: Secondary | ICD-10-CM | POA: Diagnosis not present

## 2021-01-19 DIAGNOSIS — I10 Essential (primary) hypertension: Secondary | ICD-10-CM | POA: Diagnosis not present

## 2021-01-19 DIAGNOSIS — K219 Gastro-esophageal reflux disease without esophagitis: Secondary | ICD-10-CM

## 2021-01-19 DIAGNOSIS — IMO0002 Reserved for concepts with insufficient information to code with codable children: Secondary | ICD-10-CM

## 2021-01-19 DIAGNOSIS — I252 Old myocardial infarction: Secondary | ICD-10-CM | POA: Diagnosis not present

## 2021-01-19 NOTE — Patient Instructions (Addendum)
Visit Information  Phone number for Pharmacist: [phone]  Thank you for meeting with me to discuss your medications! I look forward to working with you to achieve your health care goals. Below is a summary of what we talked about during the visit:  Goals Addressed            This Visit's Progress   . Manage My Medicine       Timeframe:  Long-Range Goal Priority:  High Start Date:     01/19/21                        Expected End Date:       07/19/21                Follow Up Date 03/08/21   - call for medicine refill 2 or 3 days before it runs out - call if I am sick and can't take my medicine - keep a list of all the medicines I take; vitamins and herbals too - use a pillbox to sort medicine  -Stop taking clopidogrel 75 mg (per cardiology stop date was 11/16/2020) -Switch metformin 1000 mg to metformin ER 500 mg twice a day -Switch OTC Nexium to pantoprazole 40 mg daily (free with mail order) -Follow up with [facility] social worker for help with transportation and groceries   Why is this important?   . These steps will help you keep on track with your medicines.     . Monitor and Manage My Blood Sugar-Diabetes Type 2       Timeframe:  Long-Range Goal Priority:  High Start Date:         01/19/21                    Expected End Date:      07/19/21                 Follow Up Date 03/08/21   - check blood sugar at prescribed times - check blood sugar if I feel it is too high or too low - enter blood sugar readings and medication or insulin into daily log - take the blood sugar log to all doctor visits  -meet with pharmacist to begin [facility] continuous glucose monitoring   Why is this important?    Checking your blood sugar at home helps to keep it from getting very high or very low.   Writing the results in a diary or log helps the doctor know how to care for you.   Your blood sugar log should have the time, date and the results.   Also, write down the amount of  insulin or other medicine that you take.   Other information, like what you ate, exercise done and how you were feeling, will also be helpful.       . Track and Manage Fluids and Swelling-Heart Failure       Timeframe:  Long-Range Goal Priority:  High Start Date:     01/19/21                        Expected End Date:    07/19/21                   Follow Up Date 03/08/21   - keep legs up while sitting - track weight in diary - use salt in moderation - watch for swelling in feet, ankles  and legs every day - weigh myself daily  -Continue Taking furosemide 40 mg daily and follow up with PCP and cardiologist regularly to make adjustments   Why is this important?    It is important to check your weight daily and watch how much salt and liquids you have.   It will help you to manage your heart failure.       Patient Care Plan: CCM Pharmacy Care Plan    Problem Identified: Hypertension, Hyperlipidemia, Diabetes, Coronary Artery Disease, GERD and Depression, Neuropathy   Priority: High    Long-Range Goal: Disease management   Start Date: 01/19/2021  Expected End Date: 07/19/2021  This Visit's Progress: On track  Priority: High  Note:   Current Barriers:  . Unable to independently monitor therapeutic efficacy . Unable to achieve control of diabetes  . Suboptimal therapeutic regimen for diabetes . Does not adhere to prescribed medication regimen . Does not maintain contact with provider office  Pharmacist Clinical Goal(s):  Marland Kitchen Over the next 30 days, patient will achieve adherence to monitoring guidelines and medication adherence to achieve therapeutic efficacy . achieve control of diabetes as evidenced by improved blood sugar at home . adhere to plan to optimize therapeutic regimen for diabetes as evidenced by report of adherence to recommended medication management changes through collaboration with PharmD and provider.   Interventions: . 1:1 collaboration with Myrlene Broker, MD regarding development and update of comprehensive plan of care as evidenced by provider attestation and co-signature . Inter-disciplinary care team collaboration (see longitudinal plan of care) . Comprehensive medication review performed; medication list updated in electronic medical record  Heart Failure / Hypertension (Goal: BP goal < 130/80, control symptoms and prevent exacerbations) uncontrolled - per patient leg swelling is inhibiting ADLs. She did try 80 mg of Lasix one day without much improvement -pt reports 2 episodes of fast heart rate in past several months -Type: Diastolic (grade 2 dysfunction) -Ejection fraction: 60-65% (Date: 06/15/20) -Current treatment . Metoprolol tartrate 25 mg - 1/2 tab BID - not on dispense report . Furosemide 40 mg daily -Medications previously tried: lisinopril (stopped during hospitalization for AKI, never restarted -Current home BP/HR readings: unknown - BP monitor is broken -Current dietary habits: limited cooking due to leg swelling/pain; eats mainly sandwiches and frozen dinners -Current exercise routine: none due to leg swelling/pain -Educated on Benefits of medications for managing symptoms and prolonging life Importance of weighing daily; if you gain more than 3 pounds in one day or 5 pounds in one week, contact prescriber Importance of blood pressure control -Recommended to continue current medication Coordinated scheduling f/u PCP visit ASAP - pt may require higher dose of Lasix or more potent loop diurertic (ie, torsemide)  -May also consider re-starting lisinopril pending labwork and BP in office  Hyperlipidemia: (LDL goal < 70) -uncontrolled - LDL was above goal prior to NSTEMI, has not been checked since then -Current treatment: . Atorvastatin 40 mg daily - . Ezetimibe 10 mg daily . Clopidogrel 75 mg daily  . 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 She also reports taking Immodium daily with metformin to prevent diarrhea. She is overdue for A1c (last 03/2020). She reports compliance with twice daily insulin, however per conversation and per chart review A1c has never improved significantly and she complains of pain from insulin needle, it is likely she misses insulin doses fairly often. -Current medications: Marland Kitchen Metformin 1000 mg - 1/2 tab BID . Relion Novolin 70/30 45 units twice day ($25/vial at [facility], pt requires 3 vials/month) . Relion glucometer and test strips -Medications previously tried: Hospital doctor, Novolog -Current home glucose readings - not checking due to finger pain -Reports hyperglycemic symptoms - polydipsia and polyuria (pt is drinking 10 x 16oz water bottles per day due to feeling "dry" and thirsty) -Current meal patterns: avoids "white" foods - rice, pasta . lunch: sandwiches - 1 slice of bread . dinner: frozen dinner -Patient would likely benefit from changing NPH insulin to Soliqua 30 units once daily - this is covered under the $35/month insulin program with her Medicare plan so this would actually save her money since 3 vials of Relion insulin is $75/month. We will wait to make changes until patient can f/u with PCP and get new labwork. -Recommend changing metformin to 500 mg and ER to help with diarrhea -Recommend to continue NPH insulin 45 units BID for now -Recommend checking blood sugar 4 times daily - before meals and at bedtime -Recommend Freestyle Libre - pt should qualify with 2 insulin injection/day  Depression/Anxiety  (Goal: manage symptoms) -controlled - per patient report -Current treatment: . Citalopram 20 mg daily -PHQ9: 0 (05/2020) -GAD7: not on file -Connected with PCP for mental health support -Educated on Benefits of medication for symptom control -Recommended to continue current medication  GERD / IBS (Goal: manage symptoms) -controlled, however she has to buy Nexium OTC for cost savings since it is Tier 3 with her insurance plan -Current treatment  . Esomeprazole 20 mg daily (buys OTC) . Loperamide 2 mg PRN -Medications previously tried: omeprazole -Recommended switching to pantoprazole 40 mg daily for cost savings (Tier 1, free with mail order pharmacy)  Neuropathy / Pain (Goal: manage pain) -uncontrolled - pt reports gabapentin 900 mg/day is not helping much with pain -Current treatment  . Gabapentin 300 mg TID . Aspercreme PRN . Tylenol 325 mg PRN -Recommended to continue current medication and follow up with PCP; pt may benefit from titrating gabapentin dose, however would need to be careful since pt already has issues with leg swelling  Transortation issues -pt reports she lives alone, cannot drive and depends on transportation provided by [facility] to get to doctor's visits, however they have failed to get her to her appt on time at least once in the past year and she was unable to contact them in a timely manner to fix the issue -pt depends on neighbors/church community to bring her to grocery store ; she would like to have a grocery delivery service but is not sure how to set one up. She canceled Mobile [employer] due to poor food quality. -Coordinate with [facility] CSW for help with transportation/grocery services  Patient Goals/Self-Care Activities . Over the next 30 days, patient will:  - take medications as prescribed focus on medication adherence by pill box check glucose 4 times daily - before meals and at bedtime, document, and provide at future appointments check blood pressure daily,  document, and provide at future appointments Keep appointments with [facility] social work and other providers  Follow  Up Plan: Telephone follow up appointment with care management team member scheduled for: 1 month      [name] was given information about Chronic Care Management services today including:  1. CCM service includes personalized support from designated clinical staff supervised by her physician, including individualized plan of care and coordination with other care providers 2. 24/7 contact phone numbers for assistance for urgent and routine care needs. 3. Standard insurance, coinsurance, copays and deductibles apply for chronic care management only during months in which we provide at least 20 minutes of these services. Most insurances cover these services at 100%, however patients may be responsible for any copay, coinsurance and/or deductible if applicable. This service may help you avoid the need for more expensive face-to-face services. 4. Only one practitioner may furnish and bill the service in a calendar month. 5. The patient may stop CCM services at any time (effective at the end of the month) by phone call to the office staff.  Patient agreed to services and verbal consent obtained.   The patient verbalized understanding of instructions, educational materials, and care plan provided today and agreed to receive a mailed copy of patient instructions, educational materials, and care plan.  Telephone follow up appointment with pharmacy team member scheduled for: 1 month  Al CorpusLindsey Waldron Gerry, PharmD, BCACP Clinical Pharmacist [facility] [phone]  Blood Glucose Monitoring, Adult Monitoring your blood sugar (glucose) is an important part of managing your diabetes. Blood glucose monitoring involves checking your blood glucose as often as directed and keeping a log or record of your results over time. Checking your blood glucose regularly and keeping  a blood glucose log can:  Help you and your health care provider adjust your diabetes management plan as needed, including your medicines or insulin.  Help you understand how food, exercise, illnesses, and medicines affect your blood glucose.  Let you know what your blood glucose is at any time. You can quickly find out if you have low blood glucose (hypoglycemia) or high blood glucose (hyperglycemia). Your health care provider will set individualized treatment goals for you. Your goals will be based on your age, other medical conditions you have, and how you respond to diabetes treatment. Generally, the goal of treatment is to maintain the following blood glucose levels:  Before meals (preprandial): 80-130 mg/dL (8.2-9.54.4-7.2 mmol/L).  After meals (postprandial): below 180 mg/dL (10 mmol/L).  A1C level: less than 7%. Supplies needed:  Blood glucose meter.  Test strips for your meter. Each meter has its own strips. You must use the strips that came with your meter.  A needle to prick your finger (lancet). Do not use a lancet more than one time.  A device that holds the lancet (lancing device).  A journal or log book to write down your results. How to check your blood glucose Checking your blood glucose 1. Wash your hands for at least 20 seconds with soap and water. 2. Prick the side of your finger (not the tip) with the lancet. Do not use the same finger consecutively. 3. Gently rub the finger until a small drop of blood appears. 4. Follow instructions that come with your meter for inserting the test strip, applying blood to the strip, and using your blood glucose meter. 5. Write down your result and any notes in your log.   Using alternative sites Some meters allow you to use areas of your body other than your finger (alternative sites) to test your blood. The most  common alternative sites are the forearm, the thigh, and the palm of your hand. Alternative sites may not be as accurate as  the fingers because blood flow is slower in those areas. This means that the result you get may be delayed, and it may be different from the result that you would get from your finger. Use the finger only, and do not use alternative sites, if:  You think you have hypoglycemia.  You sometimes do not know that your blood glucose is getting low (hypoglycemia unawareness). General tips and recommendations Blood glucose log  Every time you check your blood glucose, write down your result. Also write down any notes about things that may be affecting your blood glucose, such as your diet and exercise for the day. This information can help you and your health care provider: ? Look for patterns in your blood glucose over time. ? Adjust your diabetes management plan as needed.  Check if your meter allows you to download your records to a computer or if there is an app for the meter. Most glucose meters store a record of glucose readings in the meter.   If you have type 1 diabetes:  Check your blood glucose 4 or more times a day if you are on intensive insulin therapy with multiple daily injections (MDI) or if you are using an insulin pump. Check your blood glucose: ? Before every meal and snack. ? Before bedtime.  Also check your blood glucose: ? If you have symptoms of hypoglycemia. ? After treating low blood glucose. ? Before doing activities that create a risk for injury, like driving or using machinery. ? Before and after exercise. ? Two hours after a meal. ? Occasionally between 2:00 a.m. and 3:00 a.m., as directed.  You may need to check your blood glucose more often, 6-10 times per day, if: ? You have diabetes that is not well controlled. ? You are ill. ? You have a history of severe hypoglycemia. ? You have hypoglycemia unawareness. If you have type 2 diabetes:  Check your blood glucose 2 or more times a day if you take insulin or other diabetes medicines.  Check your blood glucose  4 or more times a day if you are on intensive insulin therapy. Occasionally, you may also need to check your glucose between 2:00 a.m. and 3:00 a.m., as directed.  Also check your blood glucose: ? Before and after exercise. ? Before doing activities that create a risk for injury, like driving or using machinery.  You may need to check your blood glucose more often if: ? Your medicine is being adjusted. ? Your diabetes is not well controlled. ? You are ill. General tips  Make sure you always have your supplies with you.  After you use a few boxes of test strips, adjust (calibrate) your blood glucose meter by following instructions that came with your meter.  If you have questions or need help, all blood glucose meters have a 24-hour hotline phone number available that you can call. Also contact your health care provider with questions or concerns you may have. Where to find more information  The American Diabetes Association: www.diabetes.org  The Association of Diabetes Care & Education Specialists: www.diabeteseducator.org Contact a health care provider if:  Your blood glucose is at or above 240 mg/dL (74.0 mmol/L) for 2 days in a row.  You have been sick or have had a fever for 2 days or longer, and you are not getting better.  You  have any of the following problems for more than 6 hours: ? You cannot eat or drink. ? You have nausea or vomiting. ? You have diarrhea. Get help right away if:  Your blood glucose is lower than 54 mg/dL (3 mmol/L).  You become confused, or you have trouble thinking clearly.  You have difficulty breathing.  You have moderate or large ketone levels in your urine. These symptoms may represent a serious problem that is an emergency. Do not wait to see if the symptoms will go away. Get medical help right away. Call your local emergency services (911 in the [place] Do not drive yourself to the hospital. Summary  Monitoring your blood glucose is an  important part of managing your diabetes.  Blood glucose monitoring involves checking your blood glucose as often as directed and keeping a log or record of your results over time.  Your health care provider will set individualized treatment goals for you. Your goals will be based on your age, other medical conditions you have, and how you respond to diabetes treatment.  Every time you check your blood glucose, write down your result. Also, write down any notes about things that may be affecting your blood glucose, such as your diet and exercise for the day. This information is not intended to replace advice given to you by your health care provider. Make sure you discuss any questions you have with your health care provider. Document Revised: 08/23/2020 Document Reviewed: 08/23/2020 Elsevier Patient Education  2021 [employer].

## 2021-01-19 NOTE — Progress Notes (Signed)
Chronic Care Management Pharmacy Note  01/19/2021 Name:  [name] MRN:  [id number] DOB:  [birth year]  Subjective: [name] is an 75 y.o. year old female who is a primary patient of Hoyt Koch, MD.  The CCM team was consulted for assistance with disease management and care coordination needs.    Engaged with patient by telephone for initial visit in response to provider referral for pharmacy case management and/or care coordination services.   Consent to Services:  The patient was given the following information about Chronic Care Management services today, agreed to services, and gave verbal consent: 1. CCM service includes personalized support from designated clinical staff supervised by the primary care provider, including individualized plan of care and coordination with other care providers 2. 24/7 contact phone numbers for assistance for urgent and routine care needs. 3. Service will only be billed when office clinical staff spend 20 minutes or more in a month to coordinate care. 4. Only one practitioner may furnish and bill the service in a calendar month. 5.The patient may stop CCM services at any time (effective at the end of the month) by phone call to the office staff. 6. The patient will be responsible for cost sharing (co-pay) of up to 20% of the service fee (after annual deductible is met). Patient agreed to services and consent obtained.  Patient Care Team: Hoyt Koch, MD as PCP - General (Internal Medicine) Skeet Latch, MD as PCP - Cardiology (Cardiology) [place], [place], [place] (Optometry) [place], [place] Cor[place], [facility] as Pharmacist (Pharmacist)  Patient is from [place] but moved away after HS. She moved back to [place] when her sister passed at age 37. Her husband left her around the same time. She does not have any family close by and lives alone. She does not driv, she uses Medicare transportation service to get to doctor's appts  however she has not had good experiences with them and has missed appts because of it.   Recent office visits: 05/26/20 Dr Sharlet Salina OV: hospital f/u (NSTEMI w/ stents, s/p TAVR). Metformin contributes to diarrhea. Off ARB due to AKI.  Recent consult visits: 09/25/20 Dr Oval Linsey (cardiology): f/u aortic stenosis s/p TAVR, venous insufficiency. Hx statin intolerance (tried 3). Continue Plavix through 12/9 then continue aspirin alone.   06/15/20 PA Angelena Form (cardiology): s/p TAVR. Worsening LE edema. Increase furosemide to 40 mg daily.  Hospital visits: Medication Reconciliation was completed by comparing discharge summary, patient's EMR and Pharmacy list, and upon discussion with patient.  Went to ED on 10/30/20 due to Lower extremity edema. Discharge date was 10/30/20. Discharged from [facility] ED    New?Medications Started at [facility] Discharge:?? -started potassium 10 mEq BID due to increasing lasix dose  Medication Changes at Hospital Discharge: -Changed furosemide from 40 mg daily to 40 mg BID x 4 days  Medications Discontinued at Hospital Discharge: -None  Medications that remain the same after Hospital Discharge:??  -All other medications will remain the same.     Admission 05/10/20-05/19/20 for acute resp failure, TAVR done 6/9. Admission 05/05/20-05/10/20 for NSTEMI.  Objective:  Lab Results  Component Value Date   CREATININE 0.64 10/30/2020   BUN 20 10/30/2020   GFR 87.72 05/26/2020   GFRNONAA >60 10/30/2020   GFRAA 99 06/30/2020   NA 134 (L) 10/30/2020   K 4.1 10/30/2020   CALCIUM 9.1 10/30/2020   CO2 27 10/30/2020    Lab Results  Component Value Date/Time   HGBA1C 11.2 (A) 03/20/2020  11:40 AM   HGBA1C 11.2 03/20/2020 11:40 AM   HGBA1C 11.2 (A) 03/20/2020 11:40 AM   HGBA1C 11.2 (A) 03/20/2020 11:40 AM   HGBA1C 12.1 (A) 08/30/2019 09:57 AM   HGBA1C 10.8 (H) 05/10/2019 10:41 AM   HGBA1C 13.4 (H) 10/21/2017 10:32 AM   GFR 87.72 05/26/2020 11:01  AM   GFR 76.91 03/20/2020 11:57 AM   MICROALBUR 20.4 (H) 03/20/2020 11:57 AM   MICROALBUR 12.1 (H) 05/10/2019 10:50 AM    Last diabetic Eye exam:  Lab Results  Component Value Date/Time   HMDIABEYEEXA Retinopathy (A) 03/16/2020 11:43 AM    Last diabetic Foot exam:  Lab Results  Component Value Date/Time   HMDIABFOOTEX Done 11/09/2010 12:00 AM     Lab Results  Component Value Date   CHOL 194 03/20/2020   HDL 36.30 (L) 03/20/2020   LDLCALC 124 (H) 03/20/2020   LDLDIRECT 110.0 11/12/2018   TRIG 170.0 (H) 03/20/2020   CHOLHDL 5 03/20/2020    Hepatic Function Latest Ref Rng & Units 10/30/2020 05/26/2020 05/10/2020  Total Protein 6.5 - 8.1 g/dL 8.2(H) 7.5 7.4  Albumin 3.5 - 5.0 g/dL 3.7 3.6 3.2(L)  AST 15 - 41 U/L 21 25 44(H)  ALT 0 - 44 U/L 29 34 46(H)  Alk Phosphatase 38 - 126 U/L 149(H) 241(H) 179(H)  Total Bilirubin 0.3 - 1.2 mg/dL 0.5 0.6 0.5    Lab Results  Component Value Date/Time   TSH 4.000 05/14/2020 04:45 AM   TSH 2.15 08/08/2015 10:56 AM   TSH 1.306 12/19/2014 06:27 PM    CBC Latest Ref Rng & Units 10/30/2020 05/26/2020 05/18/2020  WBC 4.0 - 10.5 K/uL 7.8 8.5 12.1(H)  Hemoglobin 12.0 - 15.0 g/dL 14.2 13.4 14.0  Hematocrit 36.0 - 46.0 % 42.4 39.5 42.5  Platelets 150 - 400 K/uL 228 225.0 212    No results found for: VD25OH  Clinical ASCVD: Yes  The ASCVD Risk score Mikey Bussing DC Jr., et al., 2013) failed to calculate for the following reasons:   The patient has a prior MI or stroke diagnosis    Depression screen [facility] 2/9 05/23/2020 06/28/2019 06/07/2019  Decreased Interest 0 3 1  Down, Depressed, Hopeless 0 3 1  PHQ - 2 Score 0 6 2  Altered sleeping - 3 3  Tired, decreased energy - 3 3  Change in appetite - 1 3  Feeling bad or failure about yourself  - 2 3  Trouble concentrating - 0 3  Moving slowly or fidgety/restless - 0 0  Suicidal thoughts - 0 0  PHQ-9 Score - 15 17  Some recent data might be hidden    No flowsheet data found.   Social History    Tobacco Use  Smoking Status Never Smoker  Smokeless Tobacco Never Used   BP Readings from Last 3 Encounters:  10/30/20 (!) 126/52  09/25/20 130/70  06/15/20 130/62   Pulse Readings from Last 3 Encounters:  10/30/20 79  09/25/20 76  06/15/20 72   Wt Readings from Last 3 Encounters:  10/30/20 192 lb 9.6 oz (87.4 kg)  09/25/20 186 lb (84.4 kg)  06/15/20 192 lb 6 oz (87.3 kg)    Assessment/Interventions: Review of patient past medical history, allergies, medications, health status, including review of consultants reports, laboratory and other test data, was performed as part of comprehensive evaluation and provision of chronic care management services.   SDOH:  (Social Determinants of Health) assessments and interventions performed: Yes SDOH Interventions   Flowsheet Row Most Recent  Value  SDOH Interventions   Transportation Interventions Other (Comment)  [Will get CSW from [facility] involved]      CCM Care Plan  Allergies  Allergen Reactions  . Morphine Other (See Comments)    'took me out of this world' I had to be resuscitated  . Codeine Sulfate Other (See Comments)    GI upset and pain  . Demerol [Meperidine] Nausea And Vomiting  . Meperidine Hcl Nausea And Vomiting and Swelling  . Propoxyphene Hcl Other (See Comments)    Darvocet caused sick headache    Medications Reviewed Today    Reviewed by Charlton Haws, [facility] (Pharmacist) on 01/19/21 at 1047  Med List Status: <None>  Medication Order Taking? Sig Documenting Provider Last Dose Status Informant  acetaminophen (TYLENOL) 325 MG tablet [id number] Yes Take 325 mg by mouth daily as needed for mild pain or headache. [provider] Taking Active Self  amoxicillin (AMOXIL) 500 MG capsule [id number] Yes Take 4 capsules by mouth 1 hour prior to any dental work Crista Luria Taking Active   aspirin 81 MG tablet [id number] Yes Take 81 mg by mouth daily. [provider] Taking Active Self   atorvastatin (LIPITOR) 40 MG tablet [id number]  Take 1 tablet (40 mg total) by mouth daily. Darreld Mclean, PA-C  Expired 06/25/20 2359 Self  citalopram (CELEXA) 20 MG tablet [id number] Yes TAKE 1 TABLET BY MOUTH  DAILY Hoyt Koch, MD Taking Active   Continuous Blood Gluc Sensor ([place]) MISC [id number] No Use to monitor sugars  Patient not taking: Reported on 01/19/2021   Hoyt Koch, MD Not Taking Active Self  esomeprazole (NEXIUM) 20 MG capsule [id number] Yes Take 1 capsule (20 mg total) by mouth daily at 12 noon. Hoyt Koch, MD Taking Active Self  ezetimibe (ZETIA) 10 MG tablet [id number] Yes Take 1 tablet (10 mg total) by mouth daily. Please call our office to schedule a follow up to receive additional refills. Hoyt Koch, MD Taking Active   furosemide (LASIX) 40 MG tablet [id number] Yes Take 1 tablet (40 mg total) by mouth daily. Eileen Stanford, PA-C Taking Active   gabapentin (NEURONTIN) 300 MG capsule [id number] Yes TAKE 1 CAPSULE BY MOUTH 3  TIMES DAILY Hoyt Koch, MD Taking Active   glucose blood [facility] VERIO) test strip [id number] No Use as instructed  Patient not taking: Reported on 01/19/2021   Hoyt Koch, MD Not Taking Active Self  insulin NPH-regular Human (70-30) 100 UNIT/ML injection [id number] Yes Inject 45 Units into the skin 2 (two) times daily with a meal. [provider] Taking Active   Insulin Pen Needle 31G X 8 MM MISC [id number] Yes Use to inject insulin four times daily E11.41 Hoyt Koch, MD Taking Active Self  metFORMIN (GLUCOPHAGE) 1000 MG tablet [id number] Yes TAKE ONE-HALF TABLET BY  MOUTH TWICE DAILY WITH A  MEAL Hoyt Koch, MD Taking Active   metoprolol tartrate (LOPRESSOR) 25 MG tablet [id number] Yes Take 0.5 tablets (12.5 mg total) by mouth 2 (two) times daily. Cheryln Manly, NP Taking Active   sodium chloride (OCEAN) 0.65 % SOLN nasal spray  [id number] Yes Place 1 spray into both nostrils 4 (four) times daily. Hoyt Koch, MD Taking Active   tetrahydrozoline 0.05 % ophthalmic solution [id number] Yes Place 1-2 drops into both eyes 2 (two) times daily as needed (Dry eyes). [provider] Taking Active Self  Trolamine Salicylate (ASPERCREME EX) [id number] Yes  Apply 1 application topically daily as needed (shoulder/hips/knee/knuckles). [provider] Taking Active Self          Patient Active Problem List   Diagnosis Date Noted  . Severe aortic stenosis 05/18/2020  . 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 Syncope 05/11/2020  . Pressure injury of skin 05/11/2020  . Orthostatic hypotension 05/11/2020  . Dental caries 05/11/2020  . Aortic stenosis   . Non-ST elevation myocardial infarction (NSTEMI) ([place]) 05/05/2020  . NSTEMI (non-ST elevated myocardial infarction) ([place]) 05/05/2020  . Diabetic neuropathy ([place]) 11/13/2018  . Left knee pain 10/24/2017  . Greater trochanteric bursitis of left hip 10/24/2017  . AKI (acute kidney injury) ([place]) 12/19/2014  . Reactive airway disease 04/09/2014  . Seasonal allergic rhinitis 04/09/2014  . Preventative health care 04/09/2014  . Abnormality of gait 03/17/2013  . Irritable bowel syndrome (IBS) 08/27/2012  . Type II diabetes mellitus with neurological manifestations, uncontrolled ([place]) 06/18/2007  . GERD 06/18/2007  . Hyperlipidemia associated with type 2 diabetes mellitus ([place]) 12/27/2006  . Essential hypertension 09/19/2006  . Venous (peripheral) insufficiency 09/19/2006    Immunization History  Administered Date(s) Administered  . Fluad Quad(high Dose 65+) 08/30/2019  . Influenza Split 08/27/2012  . Influenza Whole 09/27/2005, 09/18/2007, 08/29/2009, 11/09/2010  . Influenza, High Dose Seasonal PF 10/21/2017, 11/12/2018  . Influenza,inj,Quad PF,6+ Mos 10/22/2013, 12/20/2014, 10/09/2015, 07/29/2016  . PFIZER(Purple Top)SARS-COV-2  Vaccination 02/03/2020, 02/29/2020  . Pneumococcal Conjugate-13 07/23/2016  . Pneumococcal Polysaccharide-23 02/09/2013, 12/20/2014  . Tdap 08/27/2012  . Zoster 02/19/2013    Conditions to be addressed/monitored:  Hypertension, Hyperlipidemia, Diabetes, Coronary Artery Disease, GERD and Depression, Neuropathy  Care Plan : [place]  Updates made by Charlton Haws, [place] since 01/19/2021 12:00 AM    Problem: Hypertension, Hyperlipidemia, Diabetes, Coronary Artery Disease, GERD and Depression, Neuropathy   Priority: High    Long-Range Goal: Disease management   Start Date: 01/19/2021  Expected End Date: 07/19/2021  This Visit's Progress: On track  Priority: High  Note:   Current Barriers:  . Unable to independently monitor therapeutic efficacy . Unable to achieve control of diabetes  . Suboptimal therapeutic regimen for diabetes . Does not adhere to prescribed medication regimen . Does not maintain contact with provider office  Pharmacist Clinical Goal(s):  Marland Kitchen Over the next 30 days, patient will achieve adherence to monitoring guidelines and medication adherence to achieve therapeutic efficacy . achieve control of diabetes as evidenced by improved blood sugar at home . adhere to plan to optimize therapeutic regimen for diabetes as evidenced by report of adherence to recommended medication management changes through collaboration with PharmD and provider.   Interventions: . 1:1 collaboration with Hoyt Koch, MD regarding development and update of comprehensive plan of care as evidenced by provider attestation and co-signature . Inter-disciplinary care team collaboration (see longitudinal plan of care) . Comprehensive medication review performed; medication list updated in electronic medical record  Heart Failure / Hypertension (Goal: BP goal < 130/80, control symptoms and prevent exacerbations) uncontrolled - per patient leg swelling is inhibiting ADLs.  She did try 80 mg of Lasix one day without much improvement -pt reports 2 episodes of fast heart rate in past several months -Type: Diastolic (grade 2 dysfunction) -Ejection fraction: 60-65% (Date: 06/15/20) -Current treatment . Metoprolol tartrate 25 mg - 1/2 tab BID - not on dispense report . Furosemide 40 mg daily -Medications previously tried: lisinopril (stopped during hospitalization for AKI, never restarted -Current home BP/HR readings: unknown -  BP monitor is broken -Current dietary habits: limited cooking due to leg swelling/pain; eats mainly sandwiches and frozen dinners -Current exercise routine: none due to leg swelling/pain -Educated on Benefits of medications for managing symptoms and prolonging life Importance of weighing daily; if you gain more than 3 pounds in one day or 5 pounds in one week, contact prescriber Importance of blood pressure control -Recommended to continue current medication Coordinated scheduling f/u PCP visit ASAP - pt may require higher dose of Lasix or more potent loop diurertic (ie, torsemide)  -May also consider re-starting lisinopril pending labwork and BP in office  Hyperlipidemia: (LDL goal < 70) -uncontrolled - LDL was above goal prior to NSTEMI, has not been checked since then -Current treatment: . Atorvastatin 40 mg daily - . Ezetimibe 10 mg daily . Clopidogrel 75 mg daily  . Aspirin 81 mg daily -Medications previously tried: pravastatin  -pt was still taking clopidogrel; per cardiology notes it was meant to end on 11/16/20 (6 months after TAVR); advised pt to stop clopidogrel and continue aspirin 81 mg -Educated on Cholesterol goals;  Benefits of statin for ASCVD risk reduction; -Recommended to continue current medication except stop clopidogrel  Diabetes (A1c goal <8%) -uncontrolled - pt is not checking BG at home but A1c as been >10% for 2 years and pt has not made any changes. She also reports taking Immodium daily with metformin to  prevent diarrhea. She is overdue for A1c (last 03/2020). She reports compliance with twice daily insulin, however per conversation and per chart review A1c has never improved significantly and she complains of pain from insulin needle, it is likely she misses insulin doses fairly often. -Current medications: Marland Kitchen Metformin 1000 mg - 1/2 tab BID . Relion Novolin 70/30 45 units twice day ($25/vial at [facility], pt requires 3 vials/month) . Relion glucometer and test strips -Medications previously tried: Engineer, agricultural, Novolog -Current home glucose readings - not checking due to finger pain -Reports hyperglycemic symptoms - polydipsia and polyuria (pt is drinking 10 x 16oz water bottles per day due to feeling "dry" and thirsty) -Current meal patterns: avoids "white" foods - rice, pasta . lunch: sandwiches - 1 slice of bread . dinner: frozen dinner -Patient would likely benefit from a GLP-1 agonist. Recommend Rybelsus 3 mg, titrated to 14 mg over 3 months, since she is more likely to be compliant with a pill than an injection -Recommend changing metformin to 500 mg and ER to help with diarrhea -Recommend to continue NPH insulin 45 units BID for now -Recommend checking blood sugar 4 times daily - before meals and at bedtime -Recommend Freestyle Libre - pt should qualify with 2 insulin injection/day  Depression/Anxiety (Goal: manage symptoms) -controlled - per patient report -Current treatment: . Citalopram 20 mg daily -PHQ9: 0 (05/2020) -GAD7: not on file -Connected with PCP for mental health support -Educated on Benefits of medication for symptom control -Recommended to continue current medication  GERD / IBS (Goal: manage symptoms) -controlled, however she has to buy Nexium OTC for cost savings since it is Tier 3 with her insurance plan -Current treatment  . Esomeprazole 20 mg daily (buys OTC) . Loperamide 2 mg PRN -Medications previously tried: omeprazole -Recommended switching to pantoprazole 40  mg daily for cost savings (Tier 1, free with mail order pharmacy)  Neuropathy / Pain (Goal: manage pain) -uncontrolled - pt reports gabapentin 900 mg/day is not helping much with pain -Current treatment  . Gabapentin 300 mg TID . Aspercreme PRN . Tylenol 325 mg PRN -  Recommended to continue current medication and follow up with PCP; pt may benefit from titrating gabapentin dose, however would need to be careful since pt already has issues with leg swelling  Transortation issues -pt reports she lives alone, cannot drive and depends on transportation provided by [facility] to get to doctor's visits, however they have failed to get her to her appt on time at least once in the past year and she was unable to contact them in a timely manner to fix the issue -pt depends on neighbors/church community to bring her to grocery store ; she would like to have a grocery delivery service but is not sure how to set one up. She canceled [employer] due to poor food quality. -Coordinate with [facility] CSW for help with transportation/grocery services  Patient Goals/Self-Care Activities . Over the next 30 days, patient will:  - take medications as prescribed focus on medication adherence by pill box check glucose 4 times daily - before meals and at bedtime, document, and provide at future appointments check blood pressure daily, document, and provide at future appointments Keep appointments with [facility] social work and other providers  Follow Up Plan: Telephone follow up appointment with care management team member scheduled for: 1 month      Medication Assistance: None required.  Patient affirms current coverage meets needs.  Patient's preferred pharmacy is:  [facility], [address] [place] [address] Phone: [phone] Fax: [phone]  [place], [address], [address] [place], [address]  [address] Phone: [phone] Fax: [phone]  Uses pill box? Yes - monthly box Pt endorses 100% compliance  We discussed: Current pharmacy is preferred with insurance plan and patient is satisfied with pharmacy services Patient decided to: Continue current medication management strategy  Care Plan and Follow Up Patient Decision:  Patient agrees to Care Plan and Follow-up.  Plan: Telephone follow up appointment with care management team member scheduled for:  1 month  Charlene Brooke, PharmD, [facility] Clinical Pharmacist [facility] [phone]

## 2021-01-23 ENCOUNTER — Ambulatory Visit: Payer: Medicare Other | Admitting: Internal Medicine

## 2021-01-23 DIAGNOSIS — Z0289 Encounter for other administrative examinations: Secondary | ICD-10-CM

## 2021-01-26 ENCOUNTER — Telehealth: Payer: Self-pay | Admitting: Internal Medicine

## 2021-01-26 DIAGNOSIS — IMO0002 Reserved for concepts with insufficient information to code with codable children: Secondary | ICD-10-CM

## 2021-01-26 DIAGNOSIS — E11622 Type 2 diabetes mellitus with other skin ulcer: Secondary | ICD-10-CM

## 2021-01-26 DIAGNOSIS — I872 Venous insufficiency (chronic) (peripheral): Secondary | ICD-10-CM

## 2021-01-26 DIAGNOSIS — E1149 Type 2 diabetes mellitus with other diabetic neurological complication: Secondary | ICD-10-CM

## 2021-01-26 NOTE — Telephone Encounter (Signed)
See below

## 2021-01-26 NOTE — Telephone Encounter (Signed)
See other note

## 2021-01-26 NOTE — Telephone Encounter (Signed)
Notified pt of the wound referral put in

## 2021-01-26 NOTE — Telephone Encounter (Signed)
Patient has wounds on both of her ankles from where they swell really bad and she is wondering what she can do for the wounds because they are weeping fluid. Patient denies appointment at this time.  [phone]

## 2021-01-26 NOTE — Telephone Encounter (Signed)
This can be difficult to heal, so I have referred to wound clinic, hopefully she will hear soon

## 2021-01-29 ENCOUNTER — Other Ambulatory Visit: Payer: Self-pay | Admitting: *Deleted

## 2021-01-29 DIAGNOSIS — E1149 Type 2 diabetes mellitus with other diabetic neurological complication: Secondary | ICD-10-CM

## 2021-01-29 DIAGNOSIS — E1165 Type 2 diabetes mellitus with hyperglycemia: Secondary | ICD-10-CM

## 2021-01-29 DIAGNOSIS — I10 Essential (primary) hypertension: Secondary | ICD-10-CM

## 2021-01-29 DIAGNOSIS — Z0000 Encounter for general adult medical examination without abnormal findings: Secondary | ICD-10-CM

## 2021-01-29 DIAGNOSIS — IMO0002 Reserved for concepts with insufficient information to code with codable children: Secondary | ICD-10-CM

## 2021-02-02 ENCOUNTER — Encounter (HOSPITAL_BASED_OUTPATIENT_CLINIC_OR_DEPARTMENT_OTHER): Payer: Medicare Other | Attending: Internal Medicine | Admitting: Internal Medicine

## 2021-02-02 ENCOUNTER — Telehealth: Payer: Self-pay | Admitting: Internal Medicine

## 2021-02-02 ENCOUNTER — Other Ambulatory Visit: Payer: Self-pay

## 2021-02-02 DIAGNOSIS — Z952 Presence of prosthetic heart valve: Secondary | ICD-10-CM | POA: Diagnosis not present

## 2021-02-02 DIAGNOSIS — L97819 Non-pressure chronic ulcer of other part of right lower leg with unspecified severity: Secondary | ICD-10-CM | POA: Diagnosis not present

## 2021-02-02 DIAGNOSIS — I87323 Chronic venous hypertension (idiopathic) with inflammation of bilateral lower extremity: Secondary | ICD-10-CM | POA: Diagnosis not present

## 2021-02-02 DIAGNOSIS — L97211 Non-pressure chronic ulcer of right calf limited to breakdown of skin: Secondary | ICD-10-CM | POA: Insufficient documentation

## 2021-02-02 DIAGNOSIS — E1140 Type 2 diabetes mellitus with diabetic neuropathy, unspecified: Secondary | ICD-10-CM | POA: Insufficient documentation

## 2021-02-02 DIAGNOSIS — I2510 Atherosclerotic heart disease of native coronary artery without angina pectoris: Secondary | ICD-10-CM | POA: Diagnosis not present

## 2021-02-02 DIAGNOSIS — Z885 Allergy status to narcotic agent status: Secondary | ICD-10-CM | POA: Diagnosis not present

## 2021-02-02 DIAGNOSIS — I890 Lymphedema, not elsewhere classified: Secondary | ICD-10-CM | POA: Diagnosis not present

## 2021-02-02 DIAGNOSIS — L97822 Non-pressure chronic ulcer of other part of left lower leg with fat layer exposed: Secondary | ICD-10-CM | POA: Diagnosis not present

## 2021-02-02 DIAGNOSIS — L97221 Non-pressure chronic ulcer of left calf limited to breakdown of skin: Secondary | ICD-10-CM | POA: Diagnosis not present

## 2021-02-02 DIAGNOSIS — I87313 Chronic venous hypertension (idiopathic) with ulcer of bilateral lower extremity: Secondary | ICD-10-CM | POA: Diagnosis not present

## 2021-02-02 NOTE — Progress Notes (Signed)
[name], Tara F. ([id number]) Visit Report for 02/02/2021 Allergy List Details Patient Name: Date of Service: [name], [name] 02/02/2021 10:30 A M Medical Record Number: [id number] Patient Account Number: [id number] Date of Birth/Sex: Treating RN: 01-Jun-1946 (75 y.o. Female) Fonnie MuBreedlove, Lauren Primary Care Provider: Hillard Dankerrawford, Elizabeth Other Clinician: Referring Provider: Treating Provider/Extender: Shara Blazingobson, Michael Crawford, Elizabeth Weeks in Treatment: 0 Allergies Active Allergies propoxyphene meperidine morphine HCl codeine sulfate Demerol Allergy Notes Electronic Signature(s) Signed: 02/02/2021 5:08:13 PM By: Fonnie MuBreedlove, Lauren RN Entered By: Fonnie MuBreedlove, Lauren on 02/02/2021 11:00:27 -------------------------------------------------------------------------------- Arrival Information Details Patient Name: Date of Service: [name], [name] 02/02/2021 10:30 A M Medical Record Number: [id number] Patient Account Number: [id number] Date of Birth/Sex: Treating RN: 01-Jun-1946 (75 y.o. Female) Fonnie MuBreedlove, Lauren Primary Care Provider: Hillard Dankerrawford, Elizabeth Other Clinician: Referring Provider: Treating Provider/Extender: Shara Blazingobson, Michael Crawford, Elizabeth Weeks in Treatment: 0 Visit Information Patient Arrived: Danella Maiersane Arrival Time: 10:58 Accompanied By: self Transfer Assistance: None Patient Identification Verified: Yes Secondary Verification Process Completed: Yes Patient Requires Transmission-Based Precautions: No Patient Has Alerts: Yes Patient Alerts: ABI's: Non Compressible Electronic Signature(s) Signed: 02/02/2021 5:08:13 PM By: Fonnie MuBreedlove, Lauren RN Entered By: Fonnie MuBreedlove, Lauren on 02/02/2021 11:30:38 -------------------------------------------------------------------------------- Clinic Level of Care Assessment Details Patient Name: Date of Service: [name], [name] 02/02/2021 10:30 A M Medical Record Number: [id number] Patient Account Number: [id number] Date of  Birth/Sex: Treating RN: 01-Jun-1946 (75 y.o. Female) Zenaida DeedBoehlein, Linda Primary Care Provider: Hillard Dankerrawford, Elizabeth Other Clinician: Referring Provider: Treating Provider/Extender: Shara Blazingobson, Michael Crawford, Elizabeth Weeks in Treatment: 0 Clinic Level of Care Assessment Items TOOL 1 Quantity Score []  - 0 Use when EandM and Procedure is performed on INITIAL visit ASSESSMENTS - Nursing Assessment / Reassessment X- 1 20 General Physical Exam (combine w/ comprehensive assessment (listed just below) when performed on new pt. evals) X- 1 25 Comprehensive Assessment (HX, ROS, Risk Assessments, Wounds Hx, etc.) ASSESSMENTS - Wound and Skin Assessment / Reassessment []  - 0 Dermatologic / Skin Assessment (not related to wound area) ASSESSMENTS - Ostomy and/or Continence Assessment and Care []  - 0 Incontinence Assessment and Management []  - 0 Ostomy Care Assessment and Management (repouching, etc.) PROCESS - Coordination of Care X - Simple Patient / Family Education for ongoing care 1 15 []  - 0 Complex (extensive) Patient / Family Education for ongoing care X- 1 10 Staff obtains ChiropractorConsents, Records, T Results / Process Orders est []  - 0 Staff telephones HHA, Nursing Homes / Clarify orders / etc []  - 0 Routine Transfer to another Facility (non-emergent condition) []  - 0 Routine Hospital Admission (non-emergent condition) X- 1 15 New Admissions / Manufacturing engineernsurance Authorizations / Ordering NPWT Apligraf, etc. , []  - 0 Emergency Hospital Admission (emergent condition) PROCESS - Special Needs []  - 0 Pediatric / Minor Patient Management []  - 0 Isolation Patient Management []  - 0 Hearing / Language / Visual special needs []  - 0 Assessment of Community assistance (transportation, D/C planning, etc.) []  - 0 Additional assistance / Altered mentation []  - 0 Support Surface(s) Assessment (bed, cushion, seat, etc.) INTERVENTIONS - Miscellaneous []  - 0 External ear exam []  - 0 Patient Transfer  (multiple staff / Nurse, adultHoyer Lift / Similar devices) []  - 0 Simple Staple / Suture removal (25 or less) []  - 0 Complex Staple / Suture removal (26 or more) []  - 0 Hypo/Hyperglycemic Management (do not check if billed separately) X- 1 15 Ankle / Brachial Index (ABI) - do not check if billed separately Has the patient been seen at the hospital within the last three years:  Yes Total Score: 100 Level Of Care: New/Established - Level 3 Electronic Signature(s) Signed: 02/02/2021 4:54:48 PM By: Zenaida Deed RN, BSN Signed: 02/02/2021 4:54:48 PM By: Zenaida Deed RN, BSN Entered By: Zenaida Deed on 02/02/2021 12:07:56 -------------------------------------------------------------------------------- Compression Therapy Details Patient Name: Date of Service: [name], [name] 02/02/2021 10:30 A M Medical Record Number: [id number] Patient Account Number: [id number] Date of Birth/Sex: Treating RN: 10/14/1946 (75 y.o. Female) Zenaida Deed Primary Care Provider: Hillard Danker Other Clinician: Referring Provider: Treating Provider/Extender: Shara Blazing in Treatment: 0 Compression Therapy Performed for Wound Assessment: Wound #1 Right,Circumferential Lower Leg Performed By: Clinician Shawn Stall, RN Compression Type: Three Layer Post Procedure Diagnosis Same as Pre-procedure Electronic Signature(s) Signed: 02/02/2021 4:54:48 PM By: Zenaida Deed RN, BSN Entered By: Zenaida Deed on 02/02/2021 12:08:31 -------------------------------------------------------------------------------- Compression Therapy Details Patient Name: Date of Service: [name], [name] 02/02/2021 10:30 A M Medical Record Number: [id number] Patient Account Number: [id number] Date of Birth/Sex: Treating RN: 03-10-1946 (75 y.o. Female) Zenaida Deed Primary Care Provider: Hillard Danker Other Clinician: Referring Provider: Treating Provider/Extender: Shara Blazing in Treatment: 0 Compression Therapy Performed for Wound Assessment: Wound #2 Left,Circumferential Lower Leg Performed By: Clinician Shawn Stall, RN Compression Type: Three Layer Post Procedure Diagnosis Same as Pre-procedure Electronic Signature(s) Signed: 02/02/2021 4:54:48 PM By: Zenaida Deed RN, BSN Entered By: Zenaida Deed on 02/02/2021 12:08:31 -------------------------------------------------------------------------------- Encounter Discharge Information Details Patient Name: Date of Service: [name]. 02/02/2021 10:30 A M Medical Record Number: [id number] Patient Account Number: [id number] Date of Birth/Sex: Treating RN: [birth year] (75 y.o. Female) Shawn Stall Primary Care Provider: Hillard Danker Other Clinician: Referring Provider: Treating Provider/Extender: Shara Blazing in Treatment: 0 Encounter Discharge Information Items Discharge Condition: Stable Ambulatory Status: Cane Discharge Destination: Home Transportation: Private Auto Accompanied By: self Schedule Follow-up Appointment: Yes Clinical Summary of Care: Electronic Signature(s) Signed: 02/02/2021 5:07:55 PM By: Shawn Stall Entered By: Shawn Stall on 02/02/2021 16:47:26 -------------------------------------------------------------------------------- Lower Extremity Assessment Details Patient Name: Date of Service: [name], [name] 02/02/2021 10:30 A M Medical Record Number: [id number] Patient Account Number: [id number] Date of Birth/Sex: Treating RN: 01-04-1946 (75 y.o. Female) Fonnie Mu Primary Care Provider: Hillard Danker Other Clinician: Referring Provider: Treating Provider/Extender: Shara Blazing in Treatment: 0 Edema Assessment Assessed: Kyra Searles: Yes] Franne Forts: Yes] Edema: [Left: Yes] [Right: Yes] Calf Left: Right: Point of Measurement: 34 cm From Medial Instep 52 cm  52 cm Ankle Left: Right: Point of Measurement: 8 cm From Medial Instep 33 cm 33 cm Knee To Floor Left: Right: From Medial Instep 41 cm 40 cm Vascular Assessment Pulses: Dorsalis Pedis Palpable: [Left:Yes] [Right:Yes] Posterior Tibial Palpable: [Left:Yes] [Right:Yes] Electronic Signature(s) Signed: 02/02/2021 5:08:13 PM By: Fonnie Mu RN Entered By: Fonnie Mu on 02/02/2021 11:20:01 -------------------------------------------------------------------------------- Multi Wound Chart Details Patient Name: Date of Service: [name] F. 02/02/2021 10:30 A M Medical Record Number: [id number] Patient Account Number: [id number] Date of Birth/Sex: Treating RN: 03/25/1946 (75 y.o. Female) Zenaida Deed Primary Care Provider: Hillard Danker Other Clinician: Referring Provider: Treating Provider/Extender: Shara Blazing in Treatment: 0 Vital Signs Height(in): 64 Pulse(bpm): 78 Weight(lbs): 200 Blood Pressure(mmHg): 152/78 Body Mass Index(BMI): 34 Temperature(F): 97.4 Respiratory Rate(breaths/min): 17 Photos: [1:No Photos Right, Circumferential Lower Leg] [2:No Photos Left, Circumferential Lower Leg] [[name]/A:[name]/A [name]/A] Wound Location: [1:Gradually Appeared] [2:Gradually Appeared] [[name]/A:[name]/A] Wounding Event: [1:Venous Leg Ulcer] [2:Venous Leg Ulcer] [[name]/A:[name]/A] Primary Etiology: [1:Hypertension, Hypotension,] [2:Hypertension, Hypotension,] [[name]/A:[name]/A] Comorbid History: [1:Myocardial Infarction, Peripheral Venous Disease,  Type II Diabetes, Neuropathy 12/09/2020] [2:Myocardial Infarction, Peripheral Venous Disease, Type II Diabetes, Neuropathy 12/09/2020] [[name]/A:[name]/A] Date Acquired: [1:0] [2:0] [[name]/A:[name]/A] Weeks of Treatment: [1:Open] [2:Open] [[name]/A:[name]/A] Wound Status: [1:17x22x0.1] [2:15x22x0.1] [[name]/A:[name]/A] Measurements L x W x D (cm) [1:293.739] [2:259.181] [[name]/A:[name]/A] A (cm) : rea [1:29.374] [2:25.918] [[name]/A:[name]/A] Volume (cm) : [1:Full Thickness Without  Exposed] [2:Full Thickness Without Exposed] [[name]/A:[name]/A] Classification: [1:Support Structures Large] [2:Support Structures Large] [[name]/A:[name]/A] Exudate Amount: [1:Serosanguineous] [2:Serosanguineous] [[name]/A:[name]/A] Exudate Type: [1:red, brown] [2:red, brown] [[name]/A:[name]/A] Exudate Color: [1:Distinct, outline attached] [2:Distinct, outline attached] [[name]/A:[name]/A] Wound Margin: [1:Small (1-33%)] [2:Medium (34-66%)] [[name]/A:[name]/A] Granulation Amount: [1:Red, Pink] [2:Red] [[name]/A:[name]/A] Granulation Quality: [1:Large (67-100%)] [2:Medium (34-66%)] [[name]/A:[name]/A] Necrotic Amount: [1:Fascia: No] [2:Fat Layer (Subcutaneous Tissue): Yes [name]/A] Exposed Structures: [1:Fat Layer (Subcutaneous Tissue): No Tendon: No Muscle: No Joint: No Bone: No None] [2:Fascia: No Tendon: No Muscle: No Joint: No Bone: No None] [[name]/A:[name]/A] Epithelialization: [1:Compression Therapy] [2:Compression Therapy] [[name]/A:[name]/A] Treatment Notes Electronic Signature(s) Signed: 02/02/2021 4:32:00 PM By: Baltazar Najjar MD Signed: 02/02/2021 4:54:48 PM By: Zenaida Deed RN, BSN Entered By: Baltazar Najjar on 02/02/2021 12:42:32 -------------------------------------------------------------------------------- Multi-Disciplinary Care Plan Details Patient Name: Date of Service: [name]. 02/02/2021 10:30 A M Medical Record Number: [id number] Patient Account Number: [id number] Date of Birth/Sex: Treating RN: October 28, 1946 (75 y.o. Female) Zenaida Deed Primary Care Provider: Hillard Danker Other Clinician: Referring Provider: Treating Provider/Extender: Shara Blazing in Treatment: 0 Multidisciplinary Care Plan reviewed with physician Active Inactive Venous Leg Ulcer Nursing Diagnoses: Knowledge deficit related to disease process and management Potential for venous Insuffiency (use before diagnosis confirmed) Goals: Patient will maintain optimal edema control Date Initiated: 02/02/2021 Target Resolution Date: 03/02/2021 Goal  Status: Active Interventions: Assess peripheral edema status every visit. Compression as ordered Provide education on venous insufficiency Treatment Activities: Therapeutic compression applied : 02/02/2021 Notes: Wound/Skin Impairment Nursing Diagnoses: Impaired tissue integrity Knowledge deficit related to ulceration/compromised skin integrity Goals: Patient/caregiver will verbalize understanding of skin care regimen Date Initiated: 02/02/2021 Target Resolution Date: 03/02/2021 Goal Status: Active Ulcer/skin breakdown will have a volume reduction of 30% by week 4 Date Initiated: 02/02/2021 Target Resolution Date: 03/02/2021 Goal Status: Active Interventions: Assess patient/caregiver ability to obtain necessary supplies Assess patient/caregiver ability to perform ulcer/skin care regimen upon admission and as needed Assess ulceration(s) every visit Provide education on ulcer and skin care Treatment Activities: Skin care regimen initiated : 02/02/2021 Topical wound management initiated : 02/02/2021 Notes: Electronic Signature(s) Signed: 02/02/2021 4:54:48 PM By: Zenaida Deed RN, BSN Entered By: Zenaida Deed on 02/02/2021 12:06:28 -------------------------------------------------------------------------------- Pain Assessment Details Patient Name: Date of Service: [name] F. 02/02/2021 10:30 A M Medical Record Number: [id number] Patient Account Number: [id number] Date of Birth/Sex: Treating RN: 01-21-1946 (75 y.o. Female) Fonnie Mu Primary Care Provider: Hillard Danker Other Clinician: Referring Provider: Treating Provider/Extender: Shara Blazing in Treatment: 0 Active Problems Location of Pain Severity and Description of Pain Patient Has Paino Yes Site Locations Pain Location: Pain Location: Generalized Pain With Dressing Change: Yes Duration of the Pain. Constant / Intermittento Intermittent Rate the pain. Current  Pain Level: 9 Worst Pain Level: 10 Least Pain Level: 0 Tolerable Pain Level: 9 Character of Pain Describe the Pain: Aching Pain Management and Medication Current Pain Management: Medication: Yes Cold Application: No Rest: Yes Massage: No Activity: No T.E.[name].S.: No Heat Application: No Leg drop or elevation: No Is the Current Pain Management Adequate: Adequate How does your wound impact your activities of daily livingo Sleep: No Bathing: No Appetite: No Relationship With Others: No  Bladder Continence: No Emotions: No Bowel Continence: No Work: No Toileting: No Drive: No Dressing: No Hobbies: No Electronic Signature(s) Signed: 02/02/2021 5:08:13 PM By: Fonnie Mu RN Entered By: Fonnie Mu on 02/02/2021 11:14:33 -------------------------------------------------------------------------------- Patient/Caregiver Education Details Patient Name: Date of Service: [name] 2/25/2022andnbsp10:30 A M Medical Record Number: [id number] Patient Account Number: [id number] Date of Birth/Gender: Treating RN: 12-19-1945 (75 y.o. Female) Zenaida Deed Primary Care Physician: Hillard Danker Other Clinician: Referring Physician: Treating Physician/Extender: Shara Blazing in Treatment: 0 Education Assessment Education Provided To: Patient Education Topics Provided Venous: Handouts: Controlling Swelling with Multilayered Compression Wraps, Managing Venous Disease and Related Ulcers Methods: Explain/Verbal, Printed Responses: Reinforcements needed, State content correctly Welcome [facility]: o Handouts: Welcome [facility] o Methods: Explain/Verbal, Printed Responses: Reinforcements needed, State content correctly Electronic Signature(s) Signed: 02/02/2021 4:54:48 PM By: Zenaida Deed RN, BSN Entered By: Zenaida Deed on 02/02/2021  12:07:17 -------------------------------------------------------------------------------- Wound Assessment Details Patient Name: Date of Service: [name], [name] 02/02/2021 10:30 A M Medical Record Number: [id number] Patient Account Number: [id number] Date of Birth/Sex: Treating RN: 1946/10/14 (75 y.o. Female) Fonnie Mu Primary Care Provider: Hillard Danker Other Clinician: Referring Provider: Treating Provider/Extender: Shara Blazing in Treatment: 0 Wound Status Wound Number: 1 Primary Lymphedema Etiology: Wound Location: Right, Circumferential Lower Leg Wound Open Wounding Event: Gradually Appeared Status: Date Acquired: 12/09/2020 Comorbid Hypertension, Hypotension, Myocardial Infarction, Peripheral Weeks Of Treatment: 0 History: Venous Disease, Type II Diabetes, Neuropathy Clustered Wound: No Photos Wound Measurements Length: (cm) 17 Width: (cm) 22 Depth: (cm) 0.1 Area: (cm) 293.739 Volume: (cm) 29.374 % Reduction in Area: 0% % Reduction in Volume: 0% Epithelialization: None Tunneling: No Undermining: No Wound Description Classification: Full Thickness Without Exposed Support Structures Wound Margin: Distinct, outline attached Exudate Amount: Large Exudate Type: Serosanguineous Exudate Color: red, brown Foul Odor After Cleansing: No Slough/Fibrino Yes Wound Bed Granulation Amount: Small (1-33%) Exposed Structure Granulation Quality: Red, Pink Fascia Exposed: No Necrotic Amount: Large (67-100%) Fat Layer (Subcutaneous Tissue) Exposed: No Necrotic Quality: Adherent Slough Tendon Exposed: No Muscle Exposed: No Joint Exposed: No Bone Exposed: No Treatment Notes Wound #1 (Lower Leg) Wound Laterality: Right, Circumferential Cleanser Peri-Wound Care Triamcinolone 15 (g) Discharge Instruction: Use triamcinolone 15 (g) as directed Sween Lotion (Moisturizing lotion) Discharge Instruction: Apply moisturizing lotion as  directed Topical Primary Dressing KerraCel Ag Gelling Fiber Dressing, 4x5 in (silver alginate) Discharge Instruction: Apply silver alginate to wound bed as instructed Secondary Dressing ABD Pad, 8x10 Discharge Instruction: Apply over primary dressing as directed. Zetuvit Plus 4x8 in Discharge Instruction: Apply over primary dressing as directed. Secured With Compression Wrap ThreePress (3 layer compression wrap) Discharge Instruction: Apply three layer compression as directed. Compression Stockings Add-Ons Electronic Signature(s) Signed: 02/02/2021 4:34:48 PM By: Karl Ito Signed: 02/02/2021 5:08:13 PM By: Fonnie Mu RN Entered By: Karl Ito on 02/02/2021 16:25:24 -------------------------------------------------------------------------------- Wound Assessment Details Patient Name: Date of Service: [name], [name] 02/02/2021 10:30 A M Medical Record Number: [id number] Patient Account Number: [id number] Date of Birth/Sex: Treating RN: [birth year] (75 y.o. Female) Fonnie Mu Primary Care Provider: Hillard Danker Other Clinician: Referring Provider: Treating Provider/Extender: Shara Blazing in Treatment: 0 Wound Status Wound Number: 2 Primary Lymphedema Etiology: Wound Location: Left, Circumferential Lower Leg Wound Open Wounding Event: Gradually Appeared Status: Date Acquired: 12/09/2020 Comorbid Hypertension, Hypotension, Myocardial Infarction, Peripheral Weeks Of Treatment: 0 History: Venous Disease, Type II Diabetes, Neuropathy Clustered Wound: No Photos Wound Measurements Length: (cm)  15 Width: (cm) 22 Depth: (cm) 0.1 Area: (cm) 259.181 Volume: (cm) 25.918 % Reduction in Area: 0% % Reduction in Volume: 0% Epithelialization: None Tunneling: No Undermining: No Wound Description Classification: Full Thickness Without Exposed Support Structu Wound Margin: Distinct, outline attached Exudate Amount:  Large Exudate Type: Serosanguineous Exudate Color: red, brown res Foul Odor After Cleansing: No Slough/Fibrino Yes Wound Bed Granulation Amount: Medium (34-66%) Exposed Structure Granulation Quality: Red Fascia Exposed: No Necrotic Amount: Medium (34-66%) Fat Layer (Subcutaneous Tissue) Exposed: Yes Necrotic Quality: Adherent Slough Tendon Exposed: No Muscle Exposed: No Joint Exposed: No Bone Exposed: No Treatment Notes Wound #2 (Lower Leg) Wound Laterality: Left, Circumferential Cleanser Peri-Wound Care Triamcinolone 15 (g) Discharge Instruction: Use triamcinolone 15 (g) as directed Sween Lotion (Moisturizing lotion) Discharge Instruction: Apply moisturizing lotion as directed Topical Primary Dressing KerraCel Ag Gelling Fiber Dressing, 4x5 in (silver alginate) Discharge Instruction: Apply silver alginate to wound bed as instructed Secondary Dressing ABD Pad, 8x10 Discharge Instruction: Apply over primary dressing as directed. Zetuvit Plus 4x8 in Discharge Instruction: Apply over primary dressing as directed. Secured With Compression Wrap ThreePress (3 layer compression wrap) Discharge Instruction: Apply three layer compression as directed. Compression Stockings Add-Ons Electronic Signature(s) Signed: 02/02/2021 4:34:48 PM By: Karl Ito Signed: 02/02/2021 5:08:13 PM By: Fonnie Mu RN Entered By: Karl Ito on 02/02/2021 16:28:27 -------------------------------------------------------------------------------- Vitals Details Patient Name: Date of Service: [name] F. 02/02/2021 10:30 A M Medical Record Number: [id number] Patient Account Number: [id number] Date of Birth/Sex: Treating RN: 09/10/1946 (75 y.o. Female) Fonnie Mu Primary Care Provider: Hillard Danker Other Clinician: Referring Provider: Treating Provider/Extender: Shara Blazing in Treatment: 0 Vital Signs Time Taken: 10:58 Temperature  (F): 97.4 Height (in): 64 Pulse (bpm): 78 Source: Stated Respiratory Rate (breaths/min): 17 Weight (lbs): 200 Blood Pressure (mmHg): 152/78 Source: Stated Reference Range: 80 - 120 mg / dl Body Mass Index (BMI): 34.3 Electronic Signature(s) Signed: 02/02/2021 5:08:13 PM By: Fonnie Mu RN Entered By: Fonnie Mu on 02/02/2021 10:59:23

## 2021-02-02 NOTE — Progress Notes (Signed)
TWYLA, DAIS ([id number]) Visit Report for 02/02/2021 Chief Complaint Document Details Patient Name: Date of Service: [name], [name] 02/02/2021 10:30 A M Medical Record Number: [id number] Patient Account Number: [id number] Date of Birth/Sex: Treating RN: 1945-12-26 (75 y.o. Female) Zenaida Deed Primary Care Provider: Hillard Danker Other Clinician: Referring Provider: Treating Provider/Extender: Shara Blazing in Treatment: 0 Information Obtained from: Patient Chief Complaint 02/02/2021; patient is here for review of wounds on her bilateral lower leg Electronic Signature(s) Signed: 02/02/2021 4:32:00 PM By: Baltazar Najjar MD Entered By: Baltazar Najjar on 02/02/2021 12:42:55 -------------------------------------------------------------------------------- HPI Details Patient Name: Date of Service: [name] F. 02/02/2021 10:30 A M Medical Record Number: [id number] Patient Account Number: [id number] Date of Birth/Sex: Treating RN: 1946-09-14 (75 y.o. Female) Zenaida Deed Primary Care Provider: Hillard Danker Other Clinician: Referring Provider: Treating Provider/Extender: Shara Blazing in Treatment: 0 History of Present Illness HPI Description: ADMISSION 02/02/2021 This is a 75 year old woman who is referred from primary care for review of substantial epithelialized on the left and right lower legs. This is almost circumferential if you take into account significant inflammation. Very painful. The patient says that this has been there for roughly 2 months started as what she thinks was a boil but has not gotten better in fact is progressed. She is a type II diabetic and is listed as having a history of chronic venous insufficiency. During it 1 recent ER visit she was listed as having lymphedema. I do not see any arterial or venous studies in her record in epic. With careful questioning it is fairly clear  that the patient is also always had "puffy legs" but she cannot really give me a timeframe perhaps 5 or 10 years. She says things were a lot worse after her TAVR Past medical history includes aortic stenosis status post TAVR on 05/17/2020. She states the swelling was up came up a lot after this. Type 2 diabetes, coronary artery disease, orthostatic hypotension and chronic venous insufficiency Far too much edema in her lower legs to do ABIs but her pedal pulses are palpable bilaterally even through this much edema. Electronic Signature(s) Signed: 02/02/2021 4:32:00 PM By: Baltazar Najjar MD Entered By: Baltazar Najjar on 02/02/2021 12:49:41 -------------------------------------------------------------------------------- Physical Exam Details Patient Name: Date of Service: [name], [name] 02/02/2021 10:30 A M Medical Record Number: [id number] Patient Account Number: [id number] Date of Birth/Sex: Treating RN: 1946-06-22 (75 y.o. Female) Zenaida Deed Primary Care Provider: Hillard Danker Other Clinician: Referring Provider: Treating Provider/Extender: Shara Blazing in Treatment: 0 Constitutional Patient is hypertensive.. Pulse regular and within target range for patient.Marland Kitchen Respirations regular, non-labored and within target range.Marland Kitchen Appears in no distress. Respiratory work of breathing is normal. Bilateral breath sounds are clear and equal in all lobes with no wheezes, rales or rhonchi.. Cardiovascular Fairly benign exam. JVP not elevated no coccyx edema no murmurs. Dorsalis pedis pulses are palpable bilaterally. Edema present in both extremities. Nonpitting very large below the knees only 3+. Notes Wound exam; the patient has substantial de-epithelialized bilaterally. rim of erythema likely chronic venous stasis in the lower legs in a symmetric fashion. 3+ to 4+ edema nonpitting. There is no warmth no tenderness Electronic Signature(s) Signed: 02/02/2021  4:32:00 PM By: Baltazar Najjar MD Entered By: Baltazar Najjar on 02/02/2021 12:48:54 -------------------------------------------------------------------------------- Physician Orders Details Patient Name: Date of Service: [name] F. 02/02/2021 10:30 A M Medical Record Number: [id number] Patient Account Number: [id number] Date of Birth/Sex: Treating RN: 02/04/1946 [phone]  y.o. Female) Zenaida DeedBoehlein, Linda Primary Care Provider: Hillard Dankerrawford, Elizabeth Other Clinician: Referring Provider: Treating Provider/Extender: Shara Blazingobson, Michael Crawford, Elizabeth Weeks in Treatment: 0 Verbal / Phone Orders: No Diagnosis Coding Follow-up Appointments Return appointment in 3 weeks. - MD Nurse Visit: - 3/4 and 3/11 Bathing/ Shower/ Hygiene May shower with protection but do not get wound dressing(s) wet. - use cast protectors to cover wraps in the shower Edema Control - Lymphedema / SCD / Other Bilateral Lower Extremities Elevate legs to the level of the heart or above for 30 minutes daily and/or when sitting, a frequency of: - whenever sitting Avoid standing for long periods of time. Exercise regularly Wound Treatment Wound #1 - Lower Leg Wound Laterality: Right, Circumferential Peri-Wound Care: Triamcinolone 15 (g) 1 x Per Week/7 Days Discharge Instructions: Use triamcinolone 15 (g) as directed Peri-Wound Care: Sween Lotion (Moisturizing lotion) 1 x Per Week/7 Days Discharge Instructions: Apply moisturizing lotion as directed Prim Dressing: KerraCel Ag Gelling Fiber Dressing, 4x5 in (silver alginate) 1 x Per Week/7 Days ary Discharge Instructions: Apply silver alginate to wound bed as instructed Secondary Dressing: ABD Pad, 8x10 1 x Per Week/7 Days Discharge Instructions: Apply over primary dressing as directed. Secondary Dressing: Zetuvit Plus 4x8 in 1 x Per Week/7 Days Discharge Instructions: Apply over primary dressing as directed. Compression Wrap: ThreePress (3 layer compression wrap) 1 x Per  Week/7 Days Discharge Instructions: Apply three layer compression as directed. Wound #2 - Lower Leg Wound Laterality: Left, Circumferential Peri-Wound Care: Triamcinolone 15 (g) 1 x Per Week/7 Days Discharge Instructions: Use triamcinolone 15 (g) as directed Peri-Wound Care: Sween Lotion (Moisturizing lotion) 1 x Per Week/7 Days Discharge Instructions: Apply moisturizing lotion as directed Prim Dressing: KerraCel Ag Gelling Fiber Dressing, 4x5 in (silver alginate) 1 x Per Week/7 Days ary Discharge Instructions: Apply silver alginate to wound bed as instructed Secondary Dressing: ABD Pad, 8x10 1 x Per Week/7 Days Discharge Instructions: Apply over primary dressing as directed. Secondary Dressing: Zetuvit Plus 4x8 in 1 x Per Week/7 Days Discharge Instructions: Apply over primary dressing as directed. Compression Wrap: ThreePress (3 layer compression wrap) 1 x Per Week/7 Days Discharge Instructions: Apply three layer compression as directed. Electronic Signature(s) Signed: 02/02/2021 4:32:00 PM By: Baltazar Najjarobson, Michael MD Signed: 02/02/2021 4:54:48 PM By: Zenaida DeedBoehlein, Linda RN, BSN Entered By: Zenaida DeedBoehlein, Linda on 02/02/2021 12:12:31 -------------------------------------------------------------------------------- Problem List Details Patient Name: Date of Service: [name], [name] 02/02/2021 10:30 A M Medical Record Number: [id number] Patient Account Number: [id number] Date of Birth/Sex: Treating RN: Nov 30, 1946 (75 y.o. 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 02/02/2021 10:30 A M Medical Record Number: [id number] Patient Account Number: [id number] Date of Birth/Sex: Treating RN: Nov 30, 1946 (75 y.o. Female) Zenaida DeedBoehlein, Linda Primary Care Provider: Hillard Dankerrawford, Elizabeth Other Clinician: Referring Provider: Treating Provider/Extender: Shara Blazingobson, Michael Crawford, Elizabeth Weeks in Treatment: 0 Subjective Chief Complaint Information obtained from Patient  02/02/2021; patient is here for review of wounds on her bilateral lower leg History of Present Illness (HPI) ADMISSION 02/02/2021 This is a 75 year old woman who is referred from primary care for review of substantial epithelialized on the left and right lower legs. This is almost circumferential if you take into account significant inflammation. Very painful. The patient says that this has been there for roughly 2 months started as what she thinks was a boil but has not gotten better in fact is progressed. She is a type II diabetic and is listed as having a history of chronic venous insufficiency. During it 1 recent ER visit she was listed as having lymphedema. I do not see any arterial or venous studies in her record in epic. With careful questioning it is fairly clear that the patient is also always had "puffy legs" but she cannot really give me a timeframe perhaps 5 or 10 years. She says things were a lot worse after her TAVR Past medical history includes aortic stenosis status post TAVR on 05/17/2020. She states the swelling was up came  up a lot after this. Type 2 diabetes, coronary artery disease, orthostatic hypotension and chronic venous insufficiency Far too much edema in her lower legs to do ABIs but her pedal pulses are palpable bilaterally even through this much edema. Patient History Information obtained from Patient. Allergies propoxyphene, meperidine, morphine HCl, codeine sulfate, Demerol Family History Diabetes - Mother, Heart Disease - Father, No family history of Cancer, Hereditary Spherocytosis, Hypertension, Kidney Disease, Lung Disease, Seizures, Stroke, Thyroid Problems, Tuberculosis. Social History Never smoker, Marital Status - Divorced, Alcohol Use - Never, Drug Use - No History, Caffeine Use - Moderate - coffee. Medical History Eyes Denies history of Cataracts, Glaucoma, Optic Neuritis Ear/Nose/Mouth/Throat Denies history of Chronic sinus problems/congestion, Middle ear problems Hematologic/Lymphatic Denies history of Anemia, Hemophilia, Human Immunodeficiency Virus, Lymphedema, Sickle Cell Disease Respiratory Denies history of Aspiration, Asthma, Chronic Obstructive Pulmonary Disease (COPD), Pneumothorax, Sleep Apnea, Tuberculosis Cardiovascular Patient has history of Hypertension, Hypotension - orthostatic, Myocardial Infarction - Hx of non-ST in 04/2020, Peripheral Venous Disease emi Denies history of Angina, Arrhythmia, Congestive Heart Failure, Peripheral Arterial Disease, Phlebitis, Vasculitis Gastrointestinal Denies history of Cirrhosis , Colitis, Crohnoos, Hepatitis A, Hepatitis B, Hepatitis C Endocrine Patient has history of Type II Diabetes Denies history of Type I Diabetes Genitourinary Denies history of End Stage Renal Disease Immunological Denies history of Lupus Erythematosus, Raynaudoos, Scleroderma Integumentary (Skin) Denies history of History of Burn Musculoskeletal Denies history of Gout, Rheumatoid Arthritis, Osteoarthritis, Osteomyelitis Neurologic Patient has  history of Neuropathy Denies history of Dementia, Quadriplegia, Paraplegia, Seizure Disorder Oncologic Denies history of Received Chemotherapy, Received Radiation Psychiatric Denies history of Anorexia/bulimia, Confinement Anxiety Patient is treated with Insulin, Oral Agents. Blood sugar is not tested. Hospitalization/Surgery History - transcatheter aortic valve replacement 05/17/2020. Medical A Surgical History Notes nd Eyes pt. is blind in her left eye Cardiovascular hx: of aortic stenosis, hyperlipidemia, Gastrointestinal GERD IBS Review of Systems (ROS) Constitutional Symptoms (General Health) Denies complaints or symptoms of Fatigue, Fever, Chills, Marked Weight Change. Eyes Denies complaints or symptoms of Dry Eyes, Vision Changes, Glasses / Contacts. Ear/Nose/Mouth/Throat Denies complaints or symptoms of Chronic sinus problems or rhinitis. Respiratory Denies complaints or symptoms of Chronic or frequent coughs, Shortness of Breath. Cardiovascular Denies complaints or symptoms of Chest pain. Gastrointestinal Denies complaints or symptoms of Frequent diarrhea, Nausea, Vomiting. Endocrine Denies complaints or symptoms of Heat/cold intolerance. Genitourinary Denies complaints or symptoms of Frequent urination. Integumentary (Skin) Denies complaints or  symptoms of Wounds. Musculoskeletal Denies complaints or symptoms of Muscle Pain, Muscle Weakness. Neurologic Denies complaints or symptoms of Numbness/parasthesias. Psychiatric Denies complaints or symptoms of Claustrophobia, Suicidal. Objective Constitutional Patient is hypertensive.. Pulse regular and within target range for patient.Marland Kitchen Respirations regular, non-labored and within target range.Marland Kitchen Appears in no distress. Vitals Time Taken: 10:58 AM, Height: 64 in, Source: Stated, Weight: 200 lbs, Source: Stated, BMI: 34.3, Temperature: 97.4 F, Pulse: 78 bpm, Respiratory Rate: 17 breaths/min, Blood Pressure: 152/78  mmHg. Respiratory work of breathing is normal. Bilateral breath sounds are clear and equal in all lobes with no wheezes, rales or rhonchi.. Cardiovascular Fairly benign exam. JVP not elevated no coccyx edema no murmurs. Dorsalis pedis pulses are palpable bilaterally. Edema present in both extremities. Nonpitting very large below the knees only 3+. General Notes: Wound exam; the patient has substantial de-epithelialized bilaterally. rim of erythema likely chronic venous stasis in the lower legs in a symmetric fashion. 3+ to 4+ edema nonpitting. There is no warmth no tenderness Integumentary (Hair, Skin) Wound #1 status is Open. Original cause of wound was Gradually Appeared. The date acquired was: 12/09/2020. The wound is located on the Right,Circumferential Lower Leg. The wound measures 17cm length x 22cm width x 0.1cm depth; 293.739cm^2 area and 29.374cm^3 volume. There is no tunneling or undermining noted. There is a large amount of serosanguineous drainage noted. The wound margin is distinct with the outline attached to the wound base. There is small (1-33%) red, pink granulation within the wound bed. There is a large (67-100%) amount of necrotic tissue within the wound bed including Adherent Slough. Wound #2 status is Open. Original cause of wound was Gradually Appeared. The date acquired was: 12/09/2020. The wound is located on the Left,Circumferential Lower Leg. The wound measures 15cm length x 22cm width x 0.1cm depth; 259.181cm^2 area and 25.918cm^3 volume. There is Fat Layer (Subcutaneous Tissue) exposed. There is no tunneling or undermining noted. There is a large amount of serosanguineous drainage noted. The wound margin is distinct with the outline attached to the wound base. There is medium (34-66%) red granulation within the wound bed. There is a medium (34-66%) amount of necrotic tissue within the wound bed including Adherent Slough. 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 There was a Three Layer Compression Therapy Procedure by Shawn Stall, RN. Post procedure Diagnosis Wound #1: Same as Pre-Procedure Wound #2 Pre-procedure diagnosis of Wound #2 is a Venous Leg Ulcer located on the Left,Circumferential Lower Leg . There was a Three Layer Compression Therapy Procedure by Shawn Stall, RN. Post procedure Diagnosis Wound #2: Same as Pre-Procedure Plan Follow-up Appointments: Return appointment in 3 weeks. - MD Nurse Visit: - 3/4 and 3/11 Bathing/ Shower/ Hygiene: May shower with protection but do not get wound dressing(s) wet. - use cast protectors to cover wraps in the shower Edema Control - Lymphedema / SCD / Other: Elevate legs to the level of the heart or above for 30 minutes daily and/or when sitting, a frequency of: - whenever sitting Avoid standing for long periods of time. Exercise regularly WOUND #1: - Lower Leg Wound Laterality: Right, Circumferential Peri-Wound Care: Triamcinolone 15 (g) 1 x Per Week/7 Days Discharge Instructions: Use  triamcinolone 15 (g) as directed Peri-Wound Care: Sween Lotion (Moisturizing lotion) 1 x Per Week/7 Days Discharge Instructions: Apply moisturizing lotion as directed Prim Dressing: KerraCel Ag Gelling Fiber Dressing, 4x5 in (silver alginate) 1 x Per Week/7 Days ary Discharge Instructions: Apply silver alginate to wound bed as instructed Secondary Dressing: ABD Pad, 8x10 1 x Per Week/7 Days Discharge Instructions: Apply over primary dressing as directed. Secondary Dressing: Zetuvit Plus 4x8 in 1 x Per Week/7 Days Discharge Instructions:  Apply over primary dressing as directed. Com pression Wrap: ThreePress (3 layer compression wrap) 1 x Per Week/7 Days Discharge Instructions: Apply three layer compression as directed. WOUND #2: - Lower Leg Wound Laterality: Left, Circumferential Peri-Wound Care: Triamcinolone 15 (g) 1 x Per Week/7 Days Discharge Instructions: Use triamcinolone 15 (g) as directed Peri-Wound Care: Sween Lotion (Moisturizing lotion) 1 x Per Week/7 Days Discharge Instructions: Apply moisturizing lotion as directed Prim Dressing: KerraCel Ag Gelling Fiber Dressing, 4x5 in (silver alginate) 1 x Per Week/7 Days ary Discharge Instructions: Apply silver alginate to wound bed as instructed Secondary Dressing: ABD Pad, 8x10 1 x Per Week/7 Days Discharge Instructions: Apply over primary dressing as directed. Secondary Dressing: Zetuvit Plus 4x8 in 1 x Per Week/7 Days Discharge Instructions: Apply over primary dressing as directed. Com pression Wrap: ThreePress (3 layer compression wrap) 1 x Per Week/7 Days Discharge Instructions: Apply three layer compression as directed. 1. Silver alginate ABDs under 3 layer compression bilaterally 2. Patient instructed to keep her legs elevated when she is sitting. 3. I think this patient has lymphedema however like many patients the exact history here is unclear. She has not had any pelvic surgery or radiation. Surprisingly in epic there was no venous ultrasounds. When the edema control is better we will redo her ABIs and perhaps consider her for venous duplex imaging. 4. I saw no evidence of infection here 5. I saw no evidence of congestive heart failure at the bedside certainly no evidence of anything obvious that would contribute to lower extremity swelling although as mentioned bruises nonpitting. She is going to need compression ongoing. She tells me that stockings have been recommended for her in the past but she cannot get them on. We have ways to address this after we get  the legs looking better and the open area is healed. I told her that the option of doing nothing is not advisable because this can get a lot worse with more circumferential skin breakdown. I spent 45 minutes in review this patient's past medical history face-to-face evaluation and preparation of this record Electronic Signature(s) Signed: 02/02/2021 4:32:00 PM By: Baltazar Najjar MD Entered By: Baltazar Najjar on 02/02/2021 12:51:47 -------------------------------------------------------------------------------- HxROS Details Patient Name: Date of Service: [name] F. 02/02/2021 10:30 A M Medical Record Number: [id number] Patient Account Number: [id number] Date of Birth/Sex: Treating RN: 07-22-1946 (75 y.o. Female) [name] Primary Care Provider: Hillard Danker Other Clinician: Referring Provider: Treating Provider/Extender: Shara Blazing in Treatment: 0 Information Obtained From Patient Constitutional Symptoms (General Health) Complaints and Symptoms: Negative for: Fatigue; Fever; Chills; Marked Weight Change Eyes Complaints and Symptoms: Negative for: Dry Eyes; Vision Changes; Glasses / Contacts Medical History: Negative for: Cataracts; Glaucoma; Optic Neuritis Past Medical History Notes: pt. is blind in her left eye Ear/Nose/Mouth/Throat Complaints and Symptoms: Negative for: Chronic sinus problems or rhinitis Medical History: Negative for: Chronic sinus problems/congestion; Middle ear problems Respiratory Complaints and Symptoms: Negative for: Chronic or frequent coughs; Shortness of Breath Medical History: Negative for: Aspiration;  Asthma; Chronic Obstructive Pulmonary Disease (COPD); Pneumothorax; Sleep Apnea; Tuberculosis Cardiovascular Complaints and Symptoms: Negative for: Chest pain Medical History: Positive for: Hypertension; Hypotension - orthostatic; Myocardial Infarction - Hx of non-ST in 04/2020; Peripheral  Venous Disease emi Negative for: Angina; Arrhythmia; Congestive Heart Failure; Peripheral Arterial Disease; Phlebitis; Vasculitis Past Medical History Notes: hx: of aortic stenosis, hyperlipidemia, Gastrointestinal Complaints and Symptoms: Negative for: Frequent diarrhea; Nausea; Vomiting Medical History: Negative for: Cirrhosis ; Colitis; Crohns; Hepatitis A; Hepatitis B; Hepatitis C Past Medical History Notes: GERD IBS Endocrine Complaints and Symptoms: Negative for: Heat/cold intolerance Medical History: Positive for: Type II Diabetes Negative for: Type I Diabetes Treated with: Insulin, Oral agents Blood sugar tested every day: No Genitourinary Complaints and Symptoms: Negative for: Frequent urination Medical History: Negative for: End Stage Renal Disease Integumentary (Skin) Complaints and Symptoms: Negative for: Wounds Medical History: Negative for: History of Burn Musculoskeletal Complaints and Symptoms: Negative for: Muscle Pain; Muscle Weakness Medical History: Negative for: Gout; Rheumatoid Arthritis; Osteoarthritis; Osteomyelitis Neurologic Complaints and Symptoms: Negative for: Numbness/parasthesias Medical History: Positive for: Neuropathy Negative for: Dementia; Quadriplegia; Paraplegia; Seizure Disorder Psychiatric Complaints and Symptoms: Negative for: Claustrophobia; Suicidal Medical History: Negative for: Anorexia/bulimia; Confinement Anxiety Hematologic/Lymphatic Medical History: Negative for: Anemia; Hemophilia; Human Immunodeficiency Virus; Lymphedema; Sickle Cell Disease Immunological Medical History: Negative for: Lupus Erythematosus; Raynauds; Scleroderma Oncologic Medical History: Negative for: Received Chemotherapy; Received Radiation Immunizations Pneumococcal Vaccine: Received Pneumococcal Vaccination: No Immunization Notes: pt. doesn't remember last tetanus shot Implantable Devices None Hospitalization / Surgery History Type  of Hospitalization/Surgery transcatheter aortic valve replacement 05/17/2020 Family and Social History Cancer: No; Diabetes: Yes - Mother; Heart Disease: Yes - Father; Hereditary Spherocytosis: No; Hypertension: No; Kidney Disease: No; Lung Disease: No; Seizures: No; Stroke: No; Thyroid Problems: No; Tuberculosis: No; Never smoker; Marital Status - Divorced; Alcohol Use: Never; Drug Use: No History; Caffeine Use: Moderate - coffee; Financial Concerns: No; Food, Clothing or Shelter Needs: No; Support System Lacking: No; Transportation Concerns: No Electronic Signature(s) Signed: 02/02/2021 4:32:00 PM By: Baltazar Najjar MD Signed: 02/02/2021 5:08:13 PM By: Cynthia Mu RN Entered By: [name] on 02/02/2021 11:12:13 -------------------------------------------------------------------------------- SuperBill Details Patient Name: Date of Service: [name], Zenna F. 02/02/2021 Medical Record Number: [id number] Patient Account Number: [id number] Date of Birth/Sex: Treating RN: [birth year] (75 y.o. 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

## 2021-02-02 NOTE — Progress Notes (Signed)
[name], [name] ([id number]) Visit Report for 02/02/2021 Abuse/Suicide Risk Screen Details Patient Name: Date of Service: [name], [name] 02/02/2021 10:30 A M Medical Record Number: [id number] Patient Account Number: [id number] Date of Birth/Sex: Treating RN: 1945-12-11 (75 y.o. Female) Fonnie Mu Primary Care Provider: Hillard Danker Other Clinician: Referring Provider: Treating Provider/Extender: Shara Blazing in Treatment: 0 Abuse/Suicide Risk Screen Items Answer ABUSE RISK SCREEN: Has anyone close to you tried to hurt or harm you recentlyo No Do you feel uncomfortable with anyone in your familyo No Has anyone forced you do things that you didnt want to doo No Electronic Signature(s) Signed: 02/02/2021 5:08:13 PM By: Fonnie Mu RN Entered By: Fonnie Mu on 02/02/2021 11:12:19 -------------------------------------------------------------------------------- Activities of Daily Living Details Patient Name: Date of Service: [name], [name] 02/02/2021 10:30 A M Medical Record Number: [id number] Patient Account Number: [id number] Date of Birth/Sex: Treating RN: 1946/08/25 (75 y.o. Female) Fonnie Mu Primary Care Provider: Hillard Danker Other Clinician: Referring Provider: Treating Provider/Extender: Shara Blazing in Treatment: 0 Activities of Daily Living Items Answer Activities of Daily Living (Please select one for each item) Drive Automobile Not Able T Medications ake Completely Able Use T elephone Completely Able Care for Appearance Completely Able Use T oilet Completely Able Bath / Shower Completely Able Dress Self Completely Able Feed Self Completely Able Walk Need Assistance Get In / Out Bed Completely Able Housework Completely Able Prepare Meals Completely Able Handle Money Completely Able Shop for Self Completely Able Electronic Signature(s) Signed: 02/02/2021  5:08:13 PM By: Fonnie Mu RN Entered By: Fonnie Mu on 02/02/2021 11:13:06 -------------------------------------------------------------------------------- Education Screening Details Patient Name: Date of Service: Cynthia Sells F. 02/02/2021 10:30 A M Medical Record Number: [id number] Patient Account Number: [id number] Date of Birth/Sex: Treating RN: 02-Mar-1946 (75 y.o. Female) Fonnie Mu Primary Care Provider: Hillard Danker Other Clinician: Referring Provider: Treating Provider/Extender: Shara Blazing in Treatment: 0 Primary Learner Assessed: Patient Learning Preferences/Education Level/Primary Language Learning Preference: Demonstration, Communication Board, Printed Material Highest Education Level: College or Above Preferred Language: English Cognitive Barrier Language Barrier: No Translator Needed: No Memory Deficit: No Emotional Barrier: No Cultural/Religious Beliefs Affecting Medical Care: No Physical Barrier Impaired Vision: Yes Glasses, readers Impaired Hearing: No Decreased Hand dexterity: No Knowledge/Comprehension Knowledge Level: High Comprehension Level: High Ability to understand written instructions: High Ability to understand verbal instructions: High Motivation Anxiety Level: Calm Cooperation: Cooperative Education Importance: Denies Need Interest in Health Problems: Asks Questions Perception: Coherent Willingness to Engage in Self-Management High Activities: Readiness to Engage in Self-Management High Activities: Electronic Signature(s) Signed: 02/02/2021 5:08:13 PM By: Fonnie Mu RN Entered By: Fonnie Mu on 02/02/2021 11:13:40 -------------------------------------------------------------------------------- Fall Risk Assessment Details Patient Name: Date of Service: [name], [name] 02/02/2021 10:30 A M Medical Record Number: [id number] Patient Account Number: [id number] Date  of Birth/Sex: Treating RN: August 30, 1946 (75 y.o. Female) Fonnie Mu Primary Care Provider: Hillard Danker Other Clinician: Referring Provider: Treating Provider/Extender: Shara Blazing in Treatment: 0 Fall Risk Assessment Items Have you had 2 or more falls in the last 12 monthso 0 No Have you had any fall that resulted in injury in the last 12 monthso 0 No FALLS RISK SCREEN History of falling - immediate or within 3 months 0 No Secondary diagnosis (Do you have 2 or more medical diagnoseso) 0 No Ambulatory aid None/bed rest/wheelchair/nurse 0 No Crutches/cane/walker 0 No Furniture 0 No Intravenous therapy Access/Saline/Heparin Lock 0 No Gait/Transferring Normal/ bed rest/ wheelchair 0  No Weak (short steps with or without shuffle, stooped but able to lift head while walking, may seek 0 No support from furniture) Impaired (short steps with shuffle, may have difficulty arising from chair, head down, impaired 0 No balance) Mental Status Oriented to own ability 0 No Electronic Signature(s) Signed: 02/02/2021 5:08:13 PM By: Fonnie Mu RN Entered By: Fonnie Mu on 02/02/2021 11:13:47 -------------------------------------------------------------------------------- Foot Assessment Details Patient Name: Date of Service: Cynthia Sells F. 02/02/2021 10:30 A M Medical Record Number: [id number] Patient Account Number: [id number] Date of Birth/Sex: Treating RN: 1946-01-26 (75 y.o. Female) Fonnie Mu Primary Care Provider: Hillard Danker Other Clinician: Referring Provider: Treating Provider/Extender: Shara Blazing in Treatment: 0 Foot Assessment Items Site Locations + = Sensation present, - = Sensation absent, C = Callus, U = Ulcer R = Redness, W = Warmth, M = Maceration, PU = Pre-ulcerative lesion F = Fissure, S = Swelling, D = Dryness Assessment Right: Left: Other Deformity: No No Prior  Foot Ulcer: No No Prior Amputation: No No Charcot Joint: No No Ambulatory Status: Ambulatory With Help Assistance Device: Cane Gait: Steady Electronic Signature(s) Signed: 02/02/2021 5:08:13 PM By: Fonnie Mu RN Entered By: Fonnie Mu on 02/02/2021 11:16:52 -------------------------------------------------------------------------------- Nutrition Risk Screening Details Patient Name: Date of Service: [name], [name] 02/02/2021 10:30 A M Medical Record Number: [id number] Patient Account Number: [id number] Date of Birth/Sex: Treating RN: January 18, 1946 (75 y.o. Female) Fonnie Mu Primary Care Provider: Hillard Danker Other Clinician: Referring Provider: Treating Provider/Extender: Shara Blazing in Treatment: 0 Height (in): 64 Weight (lbs): 200 Body Mass Index (BMI): 34.3 Nutrition Risk Screening Items Score Screening NUTRITION RISK SCREEN: I have an illness or condition that made me change the kind and/or amount of food I eat 0 No I eat fewer than two meals per day 0 No I eat few fruits and vegetables, or milk products 0 No I have three or more drinks of beer, liquor or wine almost every day 0 No I have tooth or mouth problems that make it hard for me to eat 0 No I don't always have enough money to buy the food I need 0 No I eat alone most of the time 0 No I take three or more different prescribed or over-the-counter drugs a day 0 No Without wanting to, I have lost or gained 10 pounds in the last six months 0 No I am not always physically able to shop, cook and/or feed myself 0 No Nutrition Protocols Good Risk Protocol 0 No interventions needed Moderate Risk Protocol High Risk Proctocol Risk Level: Good Risk Score: 0 Electronic Signature(s) Signed: 02/02/2021 5:08:13 PM By: Fonnie Mu RN Entered By: Fonnie Mu on 02/02/2021 11:13:54

## 2021-02-02 NOTE — Telephone Encounter (Signed)
   Telephone encounter was:  Successful.  02/02/2021 Name: [name] MRN: [id number] DOB: 04/01/1946  [name] is a 75 y.o. year old female who is a primary care patient of Myrlene Broker, MD . The community resource team was consulted for assistance with Transportation Needs  and Caregiver Stress  Care guide performed the following interventions: Patient provided with information about care guide support team and interviewed to confirm resource needs Investigation of community resources performed Discussed resources to assist with transportation through her insurance company [employer] [phone].  .  Follow Up Plan:  No further follow up planned at this time. The patient has been provided with needed resources.  Rojelio Brenner Care Guide, Embedded Care Coordination [facility], Care Management Phone: [phone] Email: [email or web address]

## 2021-02-09 ENCOUNTER — Encounter (HOSPITAL_BASED_OUTPATIENT_CLINIC_OR_DEPARTMENT_OTHER): Payer: Medicare Other | Attending: Internal Medicine | Admitting: Physician Assistant

## 2021-02-09 ENCOUNTER — Other Ambulatory Visit: Payer: Self-pay

## 2021-02-09 DIAGNOSIS — I87333 Chronic venous hypertension (idiopathic) with ulcer and inflammation of bilateral lower extremity: Secondary | ICD-10-CM | POA: Diagnosis not present

## 2021-02-09 DIAGNOSIS — L97221 Non-pressure chronic ulcer of left calf limited to breakdown of skin: Secondary | ICD-10-CM | POA: Insufficient documentation

## 2021-02-09 DIAGNOSIS — Z952 Presence of prosthetic heart valve: Secondary | ICD-10-CM | POA: Diagnosis not present

## 2021-02-09 DIAGNOSIS — L97211 Non-pressure chronic ulcer of right calf limited to breakdown of skin: Secondary | ICD-10-CM | POA: Diagnosis not present

## 2021-02-09 DIAGNOSIS — I890 Lymphedema, not elsewhere classified: Secondary | ICD-10-CM | POA: Diagnosis not present

## 2021-02-09 DIAGNOSIS — I87323 Chronic venous hypertension (idiopathic) with inflammation of bilateral lower extremity: Secondary | ICD-10-CM | POA: Insufficient documentation

## 2021-02-09 DIAGNOSIS — I2510 Atherosclerotic heart disease of native coronary artery without angina pectoris: Secondary | ICD-10-CM | POA: Insufficient documentation

## 2021-02-12 ENCOUNTER — Other Ambulatory Visit: Payer: Self-pay

## 2021-02-12 NOTE — Progress Notes (Signed)
SHAKTI, FLEER ([id number]) Visit Report for 02/09/2021 Arrival Information Details Patient Name: Date of Service: [name], [name] 02/09/2021 10:30 A M Medical Record Number: [id number] Patient Account Number: [id number] Date of Birth/Sex: Treating RN: 05/21/1946 (75 y.o. [name], Lauren Primary Care Loron Weimer: Hillard Danker Other Clinician: Referring Mariah Harn: Treating Onyx Schirmer/Extender: Jolee Ewing in Treatment: 1 Visit Information History Since Last Visit Added or deleted any medications: No Patient Arrived: Dan Humphreys Any new allergies or adverse reactions: No Arrival Time: 10:27 Had a fall or experienced change in No Accompanied By: swlf activities of daily living that may affect Transfer Assistance: None risk of falls: Patient Identification Verified: Yes Signs or symptoms of abuse/neglect since last visito No Secondary Verification Process Completed: Yes Hospitalized since last visit: No Patient Requires Transmission-Based No Implantable device outside of the clinic excluding No Precautions: cellular tissue based products placed in the center Patient Has Alerts: Yes since last visit: Patient Alerts: ABI's: Non Compressible Has Dressing in Place as Prescribed: Yes Has Compression in Place as Prescribed: Yes Pain Present Now: Yes Electronic Signature(s) Signed: 02/12/2021 5:34:21 PM By: Fonnie Mu RN Entered By: Fonnie Mu on 02/09/2021 10:27:54 -------------------------------------------------------------------------------- Compression Therapy Details Patient Name: Date of Service: Cynthia Sells F. 02/09/2021 10:30 A M Medical Record Number: [id number] Patient Account Number: [id number] Date of Birth/Sex: Treating RN: Oct 12, 1946 (75 y.o. [name], Lauren Primary Care Kailea Dannemiller: Hillard Danker Other Clinician: Referring Flint Hakeem: Treating Zyanne Schumm/Extender: Jolee Ewing in  Treatment: 1 Compression Therapy Performed for Wound Assessment: Wound #1 Right,Circumferential Lower Leg Performed By: Clinician Fonnie Mu, RN Compression Type: Three Layer Electronic Signature(s) Signed: 02/12/2021 5:34:21 PM By: Fonnie Mu RN Entered By: Fonnie Mu on 02/09/2021 10:28:51 -------------------------------------------------------------------------------- Compression Therapy Details Patient Name: Date of Service: [name], [name] 02/09/2021 10:30 A M Medical Record Number: [id number] Patient Account Number: [id number] Date of Birth/Sex: Treating RN: [birth year] (75 y.o. [name], Lauren Primary Care Leni Pankonin: Hillard Danker Other Clinician: Referring Lessa Huge: Treating Pailynn Vahey/Extender: Jolee Ewing in Treatment: 1 Compression Therapy Performed for Wound Assessment: Wound #2 Left,Circumferential Lower Leg Performed By: Clinician Fonnie Mu, RN Compression Type: Three Layer Electronic Signature(s) Signed: 02/12/2021 5:34:21 PM By: Fonnie Mu RN Entered By: Fonnie Mu on 02/09/2021 10:28:51 -------------------------------------------------------------------------------- Encounter Discharge Information Details Patient Name: Date of Service: Cynthia Sells F. 02/09/2021 10:30 A M Medical Record Number: [id number] Patient Account Number: [id number] Date of Birth/Sex: Treating RN: Apr 25, 1946 (75 y.o. [name], Lauren Primary Care Fitz Matsuo: Hillard Danker Other Clinician: Referring Anaiyah Anglemyer: Treating Aubery Date/Extender: Jolee Ewing in Treatment: 1 Encounter Discharge Information Items Discharge Condition: Stable Ambulatory Status: Walker Discharge Destination: Home Transportation: Private Auto Accompanied By: self Schedule Follow-up Appointment: Yes Clinical Summary of Care: Patient Declined Electronic Signature(s) Signed: 02/12/2021 5:34:21 PM By:  Fonnie Mu RN Entered By: Fonnie Mu on 02/09/2021 10:29:35 -------------------------------------------------------------------------------- Patient/Caregiver Education Details Patient Name: Date of Service: [name] 3/4/2022andnbsp10:30 A M Medical Record Number: [id number] Patient Account Number: [id number] Date of Birth/Gender: Treating RN: 1946/11/29 (75 y.o. [name], Lauren Primary Care Physician: Hillard Danker Other Clinician: Referring Physician: Treating Physician/Extender: Jolee Ewing in Treatment: 1 Education Assessment Education Provided To: Patient Education Topics Provided Venous: Methods: Explain/Verbal Responses: State content correctly Electronic Signature(s) Signed: 02/12/2021 5:34:21 PM By: Fonnie Mu RN Entered By: Fonnie Mu on 02/09/2021 10:29:16 -------------------------------------------------------------------------------- Wound Assessment Details Patient Name: Date of Service: Cynthia Sells F. 02/09/2021 10:30 A  M Medical Record Number: [id number] Patient Account Number: [id number] Date of Birth/Sex: Treating RN: [birth year] (75 y.o. [name], Lauren Primary Care Tennille Montelongo: Hillard Danker Other Clinician: Referring Bryndon Cumbie: Treating Jaena Brocato/Extender: Jolee Ewing in Treatment: 1 Wound Status Wound Number: 1 Primary Etiology: Lymphedema Wound Location: Right, Circumferential Lower Leg Wound Status: Open Wounding Event: Gradually Appeared Date Acquired: 12/09/2020 Weeks Of Treatment: 1 Clustered Wound: No Wound Measurements Length: (cm) 17 Width: (cm) 22 Depth: (cm) 0.1 Area: (cm) 293.739 Volume: (cm) 29.374 % Reduction in Area: 0% % Reduction in Volume: 0% Wound Description Classification: Full Thickness Without Exposed Support Structur es Treatment Notes Wound #1 (Lower Leg) Wound Laterality: Right,  Circumferential Cleanser Peri-Wound Care Triamcinolone 15 (g) Discharge Instruction: Use triamcinolone 15 (g) as directed Sween Lotion (Moisturizing lotion) Discharge Instruction: Apply moisturizing lotion as directed Topical Primary Dressing KerraCel Ag Gelling Fiber Dressing, 4x5 in (silver alginate) Discharge Instruction: Apply silver alginate to wound bed as instructed Secondary Dressing ABD Pad, 8x10 Discharge Instruction: Apply over primary dressing as directed. Zetuvit Plus 4x8 in Discharge Instruction: Apply over primary dressing as directed. Secured With Compression Wrap ThreePress (3 layer compression wrap) Discharge Instruction: Apply three layer compression as directed. Compression Stockings Add-Ons Electronic Signature(s) Signed: 02/12/2021 5:34:21 PM By: Fonnie Mu RN Entered By: Fonnie Mu on 02/09/2021 10:28:31 -------------------------------------------------------------------------------- Wound Assessment Details Patient Name: Date of Service: [name], Chemika F. 02/09/2021 10:30 A M Medical Record Number: [id number] Patient Account Number: [id number] Date of Birth/Sex: Treating RN: 1946-09-24 (75 y.o. [name], Lauren Primary Care Griffith Santilli: Hillard Danker Other Clinician: Referring Sherri Mcarthy: Treating Elliot Simoneaux/Extender: Jolee Ewing in Treatment: 1 Wound Status Wound Number: 2 Primary Etiology: Lymphedema Wound Location: Left, Circumferential Lower Leg Wound Status: Open Wounding Event: Gradually Appeared Date Acquired: 12/09/2020 Weeks Of Treatment: 1 Clustered Wound: No Wound Measurements Length: (cm) 15 Width: (cm) 22 Depth: (cm) 0.1 Area: (cm) 259.181 Volume: (cm) 25.918 % Reduction in Area: 0% % Reduction in Volume: 0% Wound Description Classification: Full Thickness Without Exposed Support Structur es Treatment Notes Wound #2 (Lower Leg) Wound Laterality: Left,  Circumferential Cleanser Peri-Wound Care Triamcinolone 15 (g) Discharge Instruction: Use triamcinolone 15 (g) as directed Sween Lotion (Moisturizing lotion) Discharge Instruction: Apply moisturizing lotion as directed Topical Primary Dressing KerraCel Ag Gelling Fiber Dressing, 4x5 in (silver alginate) Discharge Instruction: Apply silver alginate to wound bed as instructed Secondary Dressing ABD Pad, 8x10 Discharge Instruction: Apply over primary dressing as directed. Zetuvit Plus 4x8 in Discharge Instruction: Apply over primary dressing as directed. Secured With Compression Wrap ThreePress (3 layer compression wrap) Discharge Instruction: Apply three layer compression as directed. Compression Stockings Add-Ons Electronic Signature(s) Signed: 02/12/2021 5:34:21 PM By: Fonnie Mu RN Signed: 02/12/2021 5:34:21 PM By: Fonnie Mu RN Entered By: Fonnie Mu on 02/09/2021 10:28:31 -------------------------------------------------------------------------------- Vitals Details Patient Name: Date of Service: Cynthia Sells F. 02/09/2021 10:30 A M Medical Record Number: [id number] Patient Account Number: [id number] Date of Birth/Sex: Treating RN: 02-18-1946 (75 y.o. [name], Lauren Primary Care Lailany Enoch: Hillard Danker Other Clinician: Referring Paislynn Hegstrom: Treating Yuriana Gaal/Extender: Jolee Ewing in Treatment: 1 Vital Signs Time Taken: 10:27 Temperature (F): 98.7 Height (in): 64 Pulse (bpm): 89 Weight (lbs): 200 Respiratory Rate (breaths/min): 17 Body Mass Index (BMI): 34.3 Blood Pressure (mmHg): 176/74 Reference Range: 80 - 120 mg / dl Electronic Signature(s) Signed: 02/12/2021 5:34:21 PM By: Fonnie Mu RN Entered By: Fonnie Mu on 02/09/2021 10:28:15

## 2021-02-12 NOTE — Progress Notes (Signed)
[name], [name] ([id number]) Visit Report for 02/09/2021 SuperBill Details Patient Name: Date of Service: [name], [name] 02/09/2021 Medical Record Number: [id number] Patient Account Number: [id number] Date of Birth/Sex: Treating RN: Jul 02, 1946 (75 y.o. 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 Electronic Signature(s) Signed: 02/09/2021 4:38:50 PM By: Lenda Kelp PA-C Signed: 02/12/2021 5:34:21 PM By: Fonnie Mu RN Entered By: Fonnie Mu on 02/09/2021 10:29:45

## 2021-02-15 ENCOUNTER — Other Ambulatory Visit: Payer: Self-pay

## 2021-02-15 ENCOUNTER — Telehealth: Payer: Self-pay | Admitting: Internal Medicine

## 2021-02-15 ENCOUNTER — Ambulatory Visit (INDEPENDENT_AMBULATORY_CARE_PROVIDER_SITE_OTHER): Payer: Medicare Other | Admitting: Pharmacist

## 2021-02-15 DIAGNOSIS — I10 Essential (primary) hypertension: Secondary | ICD-10-CM | POA: Diagnosis not present

## 2021-02-15 DIAGNOSIS — E1149 Type 2 diabetes mellitus with other diabetic neurological complication: Secondary | ICD-10-CM | POA: Diagnosis not present

## 2021-02-15 DIAGNOSIS — E785 Hyperlipidemia, unspecified: Secondary | ICD-10-CM

## 2021-02-15 DIAGNOSIS — E1165 Type 2 diabetes mellitus with hyperglycemia: Secondary | ICD-10-CM | POA: Diagnosis not present

## 2021-02-15 DIAGNOSIS — E1169 Type 2 diabetes mellitus with other specified complication: Secondary | ICD-10-CM

## 2021-02-15 DIAGNOSIS — I872 Venous insufficiency (chronic) (peripheral): Secondary | ICD-10-CM

## 2021-02-15 DIAGNOSIS — IMO0002 Reserved for concepts with insufficient information to code with codable children: Secondary | ICD-10-CM

## 2021-02-15 NOTE — Telephone Encounter (Signed)
   Telephone encounter was:  Successful.  02/15/2021 Name: [name] MRN: [id number] DOB: 1946/03/29  [name] is a 75 y.o. year old female who is a primary care patient of Okey Dupre [place] Miles, MD . The community resource team was consulted for assistance with Transportation Needs   Care guide performed the following interventions: Discussed resources to assist with transportation. I received an in box message that the patients transportation fell through for her wound care appointment tomorrow. When I called the patient she said that she found a ride after all. I gave her my telephone number for any future emergency transportation needs and she thanked me for calling to help. I spoke with Mardella Layman the Pharmacist who sent the message and explained that she didn't need to place a referral after all because patient found a ride to her appointment. .  Follow Up Plan:  No further follow up planned at this time. The patient has been provided with needed resources.  Rojelio Brenner Care Guide, Embedded Care Coordination [facility], Care Management Phone: [phone] Email: [email or web address]

## 2021-02-15 NOTE — Progress Notes (Signed)
Chronic Care Management Pharmacy Note  02/15/2021 Name:  [name] MRN:  [id number] DOB:  06-06-1946  Subjective: [name] is an 75 y.o. year old female who is a primary patient of Hoyt Koch, MD.  The CCM team was consulted for assistance with disease management and care coordination needs.    Engaged with patient by telephone for follow up visit in response to provider referral for pharmacy case management and/or care coordination services.   Consent to Services:  The patient was given information about Chronic Care Management services, agreed to services, and gave verbal consent prior to initiation of services.  Please see initial visit note for detailed documentation.   Patient Care Team: Hoyt Koch, MD as PCP - General (Internal Medicine) Skeet Latch, MD as PCP - Cardiology (Cardiology) [place], [place], [place] (Optometry) [place], [place], [facility] as Pharmacist (Pharmacist)  Patient is from [place] but moved away after HS. She moved back to [place] when her sister passed at age 68. Her husband left her around the same time. She does not have any family close by and lives alone. She does not driv, she uses Medicare transportation service to get to doctor's appts however she has not had good experiences with them and has missed appts because of it.   Recent office visits: 05/26/20 Dr Sharlet Salina OV: hospital f/u (NSTEMI w/ stents, s/p TAVR). Metformin contributes to diarrhea. Off ARB due to AKI.  Recent consult visits: 09/25/20 Dr Oval Linsey (cardiology): f/u aortic stenosis s/p TAVR, venous insufficiency. Hx statin intolerance (tried 3). Continue Plavix through 12/9 then continue aspirin alone.   06/15/20 PA Angelena Form (cardiology): s/p TAVR. Worsening LE edema. Increase furosemide to 40 mg daily.  Hospital visits: Medication Reconciliation was completed by comparing discharge summary, patient's EMR and Pharmacy list, and upon discussion  with patient.  Went to ED on 10/30/20 due to Lower extremity edema. Discharge date was 10/30/20. Discharged from [facility] ED    New?Medications Started at [facility] Discharge:?? -started potassium 10 mEq BID due to increasing lasix dose  Medication Changes at Hospital Discharge: -Changed furosemide from 40 mg daily to 40 mg BID x 4 days  Medications Discontinued at Hospital Discharge: -None   Medications that remain the same after Hospital Discharge:??  -All other medications will remain the same.     Admission 05/10/20-05/19/20 for acute resp failure, TAVR done 6/9. Admission 05/05/20-05/10/20 for NSTEMI.  Objective:  Lab Results  Component Value Date   CREATININE 0.64 10/30/2020   BUN 20 10/30/2020   GFR 87.72 05/26/2020   GFRNONAA >60 10/30/2020   GFRAA 99 06/30/2020   NA 134 (L) 10/30/2020   K 4.1 10/30/2020   CALCIUM 9.1 10/30/2020   CO2 27 10/30/2020    Lab Results  Component Value Date/Time   HGBA1C 11.2 (A) 03/20/2020 11:40 AM   HGBA1C 11.2 03/20/2020 11:40 AM   HGBA1C 11.2 (A) 03/20/2020 11:40 AM   HGBA1C 11.2 (A) 03/20/2020 11:40 AM   HGBA1C 12.1 (A) 08/30/2019 09:57 AM   HGBA1C 10.8 (H) 05/10/2019 10:41 AM   HGBA1C 13.4 (H) 10/21/2017 10:32 AM   GFR 87.72 05/26/2020 11:01 AM   GFR 76.91 03/20/2020 11:57 AM   MICROALBUR 20.4 (H) 03/20/2020 11:57 AM   MICROALBUR 12.1 (H) 05/10/2019 10:50 AM    Last diabetic Eye exam:  Lab Results  Component Value Date/Time   HMDIABEYEEXA Retinopathy (A) 03/16/2020 11:43 AM    Last diabetic Foot exam:  Lab Results  Component Value  Date/Time   HMDIABFOOTEX Done 11/09/2010 12:00 AM     Lab Results  Component Value Date   CHOL 194 03/20/2020   HDL 36.30 (L) 03/20/2020   LDLCALC 124 (H) 03/20/2020   LDLDIRECT 110.0 11/12/2018   TRIG 170.0 (H) 03/20/2020   CHOLHDL 5 03/20/2020    Hepatic Function Latest Ref Rng & Units 10/30/2020 05/26/2020 05/10/2020  Total Protein 6.5 - 8.1 g/dL 8.2(H) 7.5 7.4  Albumin  3.5 - 5.0 g/dL 3.7 3.6 3.2(L)  AST 15 - 41 U/L 21 25 44(H)  ALT 0 - 44 U/L 29 34 46(H)  Alk Phosphatase 38 - 126 U/L 149(H) 241(H) 179(H)  Total Bilirubin 0.3 - 1.2 mg/dL 0.5 0.6 0.5    Lab Results  Component Value Date/Time   TSH 4.000 05/14/2020 04:45 AM   TSH 2.15 08/08/2015 10:56 AM   TSH 1.306 12/19/2014 06:27 PM    CBC Latest Ref Rng & Units 10/30/2020 05/26/2020 05/18/2020  WBC 4.0 - 10.5 K/uL 7.8 8.5 12.1(H)  Hemoglobin 12.0 - 15.0 g/dL 14.2 13.4 14.0  Hematocrit 36.0 - 46.0 % 42.4 39.5 42.5  Platelets 150 - 400 K/uL 228 225.0 212    No results found for: VD25OH  Clinical ASCVD: Yes  The ASCVD Risk score Mikey Bussing DC Jr., et al., 2013) failed to calculate for the following reasons:   The patient has a prior MI or stroke diagnosis    Depression screen [facility] 2/9 05/23/2020 06/28/2019 06/07/2019  Decreased Interest 0 3 1  Down, Depressed, Hopeless 0 3 1  PHQ - 2 Score 0 6 2  Altered sleeping - 3 3  Tired, decreased energy - 3 3  Change in appetite - 1 3  Feeling bad or failure about yourself  - 2 3  Trouble concentrating - 0 3  Moving slowly or fidgety/restless - 0 0  Suicidal thoughts - 0 0  PHQ-9 Score - 15 17  Some recent data might be hidden    No flowsheet data found.   Social History   Tobacco Use  Smoking Status Never Smoker  Smokeless Tobacco Never Used   BP Readings from Last 3 Encounters:  10/30/20 (!) 126/52  09/25/20 130/70  06/15/20 130/62   Pulse Readings from Last 3 Encounters:  10/30/20 79  09/25/20 76  06/15/20 72   Wt Readings from Last 3 Encounters:  10/30/20 192 lb 9.6 oz (87.4 kg)  09/25/20 186 lb (84.4 kg)  06/15/20 192 lb 6 oz (87.3 kg)    Assessment/Interventions: Review of patient past medical history, allergies, medications, health status, including review of consultants reports, laboratory and other test data, was performed as part of comprehensive evaluation and provision of chronic care management services.   SDOH:  (Social  Determinants of Health) assessments and interventions performed: Yes SDOH Interventions   Flowsheet Row Most Recent Value  SDOH Interventions   Financial Strain Interventions Other (Comment)  [[employer] insulin,  limited funds for DM meds]     SDOH Screenings   Alcohol Screen: Not on file  Depression (PHQ2-9): Low Risk   . PHQ-2 Score: 0  Financial Resource Strain: Medium Risk  . Difficulty of Paying Living Expenses: Somewhat hard  Food Insecurity: No Food Insecurity  . Worried About Charity fundraiser in the Last Year: Never true  . Ran Out of Food in the Last Year: Never true  Housing: Low Risk   . Last Housing Risk Score: 0  Physical Activity: Not on file  Social Connections: Not on  file  Stress: Not on file  Tobacco Use: Low Risk   . Smoking Tobacco Use: Never Smoker  . Smokeless Tobacco Use: Never Used  Transportation Needs: Unmet Transportation Needs  . Lack of Transportation (Medical): Yes  . Lack of Transportation (Non-Medical): Yes   CCM Care Plan  Allergies  Allergen Reactions  . Morphine Other (See Comments)    'took me out of this world' I had to be resuscitated  . Codeine Sulfate Other (See Comments)    GI upset and pain  . Demerol [Meperidine] Nausea And Vomiting  . Meperidine Hcl Nausea And Vomiting and Swelling  . Propoxyphene Hcl Other (See Comments)    Darvocet caused sick headache    Medications Reviewed Today    Reviewed by Charlton Haws, [facility] (Pharmacist) on 02/15/21 at [place] List Status: <None>  Medication Order Taking? Sig Documenting Provider Last Dose Status Informant  acetaminophen (TYLENOL) 325 MG tablet [id number] Yes Take 325 mg by mouth daily as needed for mild pain or headache. [provider] Taking Active Self  amoxicillin (AMOXIL) 500 MG capsule [id number] Yes Take 4 capsules by mouth 1 hour prior to any dental work Crista Luria Taking Active   aspirin 81 MG tablet [id number] Yes Take 81 mg by mouth daily.  [provider] Taking Active Self  atorvastatin (LIPITOR) 40 MG tablet [id number]  Take 1 tablet (40 mg total) by mouth daily. Darreld Mclean, PA-C  Expired 06/25/20 2359 Self  citalopram (CELEXA) 20 MG tablet [id number] Yes TAKE 1 TABLET BY MOUTH  DAILY Hoyt Koch, MD Taking Active   Continuous Blood Gluc Sensor ([place]) MISC [id number] Yes Use to monitor sugars Hoyt Koch, MD Taking Active   esomeprazole (NEXIUM) 20 MG capsule [id number] Yes Take 1 capsule (20 mg total) by mouth daily at 12 noon. Hoyt Koch, MD Taking Active Self  ezetimibe (ZETIA) 10 MG tablet [id number] Yes Take 1 tablet (10 mg total) by mouth daily. Please call our office to schedule a follow up to receive additional refills. Hoyt Koch, MD Taking Active   furosemide (LASIX) 40 MG tablet [id number] Yes Take 1 tablet (40 mg total) by mouth daily. Eileen Stanford, PA-C Taking Active   gabapentin (NEURONTIN) 300 MG capsule [id number] Yes TAKE 1 CAPSULE BY MOUTH 3  TIMES DAILY Hoyt Koch, MD Taking Active   glucose blood [facility] VERIO) test strip [id number] Yes Use as instructed Hoyt Koch, MD Taking Active   insulin NPH-regular Human (70-30) 100 UNIT/ML injection [id number] Yes Inject 45 Units into the skin 2 (two) times daily with a meal. [provider] Taking Active   Insulin Pen Needle 31G X 8 MM MISC [id number] Yes Use to inject insulin four times daily E11.41 Hoyt Koch, MD Taking Active Self  metFORMIN (GLUCOPHAGE) 1000 MG tablet [id number] Yes TAKE ONE-HALF TABLET BY  MOUTH TWICE DAILY WITH A  MEAL Hoyt Koch, MD Taking Active   metoprolol tartrate (LOPRESSOR) 25 MG tablet [id number] Yes Take 0.5 tablets (12.5 mg total) by mouth 2 (two) times daily. Cheryln Manly, NP Taking Active   sodium chloride (OCEAN) 0.65 % SOLN nasal spray [id number] Yes Place 1 spray into both nostrils 4 (four) times  daily. Hoyt Koch, MD Taking Active   tetrahydrozoline 0.05 % ophthalmic solution [id number] Yes Place 1-2 drops into both eyes 2 (two) times daily as needed (Dry eyes). [provider] Taking Active Self  Trolamine Salicylate (ASPERCREME EX) [id number] Yes Apply 1 application topically daily as needed (shoulder/hips/knee/knuckles). [provider] Taking Active Self          Patient Active Problem List   Diagnosis Date Noted  . Severe aortic stenosis 05/18/2020  . 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 Syncope 05/11/2020  . Pressure injury of skin 05/11/2020  . Orthostatic hypotension 05/11/2020  . Dental caries 05/11/2020  . Aortic stenosis   . Non-ST elevation myocardial infarction (NSTEMI) ([place]) 05/05/2020  . NSTEMI (non-ST elevated myocardial infarction) ([place]) 05/05/2020  . Diabetic neuropathy ([place]) 11/13/2018  . Left knee pain 10/24/2017  . Greater trochanteric bursitis of left hip 10/24/2017  . AKI (acute kidney injury) ([place]) 12/19/2014  . Reactive airway disease 04/09/2014  . Seasonal allergic rhinitis 04/09/2014  . Preventative health care 04/09/2014  . Abnormality of gait 03/17/2013  . Irritable bowel syndrome (IBS) 08/27/2012  . Type II diabetes mellitus with neurological manifestations, uncontrolled ([place]) 06/18/2007  . GERD 06/18/2007  . Hyperlipidemia associated with type 2 diabetes mellitus ([place]) 12/27/2006  . Essential hypertension 09/19/2006  . Venous (peripheral) insufficiency 09/19/2006    Immunization History  Administered Date(s) Administered  . Fluad Quad(high Dose 65+) 08/30/2019  . Influenza Split 08/27/2012  . Influenza Whole 09/27/2005, 09/18/2007, 08/29/2009, 11/09/2010  . Influenza, High Dose Seasonal PF 10/21/2017, 11/12/2018  . Influenza,inj,Quad PF,6+ Mos 10/22/2013, 12/20/2014, 10/09/2015, 07/29/2016  . PFIZER(Purple Top)SARS-COV-2 Vaccination 02/03/2020, 02/29/2020  . Pneumococcal  Conjugate-13 07/23/2016  . Pneumococcal Polysaccharide-23 02/09/2013, 12/20/2014  . Tdap 08/27/2012  . Zoster 02/19/2013    Conditions to be addressed/monitored:  Hypertension, Hyperlipidemia, Diabetes, Coronary Artery Disease, GERD and Depression, Neuropathy  Care Plan : [place]  Updates made by Charlton Haws, [place] since 02/15/2021 12:00 AM    Problem: Hypertension, Hyperlipidemia, Diabetes, Coronary Artery Disease, GERD and Depression, Neuropathy   Priority: High    Long-Range Goal: Disease management   Start Date: 01/19/2021  Expected End Date: 07/19/2021  This Visit's Progress: Not on track  Recent Progress: On track  Priority: High  Note:   Current Barriers:  . Unable to independently monitor therapeutic efficacy . Unable to achieve control of diabetes  . Suboptimal therapeutic regimen for diabetes . Does not adhere to prescribed medication regimen . Does not maintain contact with provider office  Pharmacist Clinical Goal(s):  Marland Kitchen Over the next 30 days, patient will achieve adherence to monitoring guidelines and medication adherence to achieve therapeutic efficacy . achieve control of diabetes as evidenced by improved blood sugar at home . adhere to plan to optimize therapeutic regimen for diabetes as evidenced by report of adherence to recommended medication management changes through collaboration with PharmD and provider.   Interventions: . 1:1 collaboration with Hoyt Koch, MD regarding development and update of comprehensive plan of care as evidenced by provider attestation and co-signature . Inter-disciplinary care team collaboration (see longitudinal plan of care) . Comprehensive medication review performed; medication list updated in electronic medical record  Heart Failure / Hypertension (Goal: BP goal < 130/80, control symptoms and prevent exacerbations) uncontrolled - per patient leg swelling is inhibiting ADLs. She did try 80 mg of  Lasix one day without much improvement -pt reports 2 episodes of fast heart rate in past several months -Type: Diastolic (grade 2 dysfunction) -Ejection fraction: 60-65% (Date: 06/15/20) -Current treatment . Metoprolol tartrate 25 mg - 1/2 tab BID - not on dispense report . Furosemide 40 mg daily -Medications previously tried: lisinopril (stopped  during hospitalization for AKI, never restarted -Current home BP/HR readings: unknown - BP monitor is broken -Current dietary habits: limited cooking due to leg swelling/pain; eats mainly sandwiches and frozen dinners -Current exercise routine: none due to leg swelling/pain -Educated on Benefits of medications for managing symptoms and prolonging life Importance of weighing daily; if you gain more than 3 pounds in one day or 5 pounds in one week, contact prescriber Importance of blood pressure control -Recommended to continue current medication Coordinated scheduling f/u PCP visit ASAP - pt may require higher dose of Lasix or more potent loop diurertic (ie, torsemide)  -May also consider re-starting lisinopril pending labwork and BP in office  Hyperlipidemia: (LDL goal < 70) -uncontrolled - LDL was above goal prior to NSTEMI, has not been checked since then -Current treatment: . Atorvastatin 40 mg daily - . Ezetimibe 10 mg daily . Clopidogrel 75 mg daily  . Aspirin 81 mg daily -Medications previously tried: pravastatin  -pt was still taking clopidogrel; per cardiology notes it was meant to end on 11/16/20 (6 months after TAVR); advised pt to stop clopidogrel and continue aspirin 81 mg -Educated on Cholesterol goals;  Benefits of statin for ASCVD risk reduction; -Recommended to continue current medication except stop clopidogrel  Diabetes (A1c goal <8%) -uncontrolled - pt is not checking BG at home but A1c as been >10% for 2 years and pt has not made any changes. She also reports taking Immodium daily with metformin to prevent diarrhea. She is  overdue for A1c (last 03/2020). She reports compliance with twice daily insulin, however per conversation and per chart review A1c has never improved significantly and she complains of pain from insulin needle, it is likely she misses insulin doses fairly often. -Pt reports she received a phone call from DME about Freestyle Elenor Legato but does not remember what was said. She also missed her PCP visit due to transportation issues so no med changes have been made. -Current medications: Marland Kitchen Metformin 1000 mg - 1/2 tab BID . Relion Novolin 70/30 45 units twice day ($25/vial at [facility], pt requires 3 vials/month) . Relion glucometer and test strips -Medications previously tried: Engineer, agricultural, Novolog -Current home glucose readings - not checking due to finger pain -Reports hyperglycemic symptoms - polydipsia and polyuria (pt is drinking 10 x 16oz water bottles per day due to feeling "dry" and thirsty) -Current meal patterns: avoids "white" foods - rice, pasta . lunch: sandwiches - 1 slice of bread . dinner: frozen dinner -Patient would likely benefit from a GLP-1 agonist. Recommend Rybelsus 3 mg, titrated to 14 mg over 3 months, since she is more likely to be compliant with a pill than an injection -Recommend changing metformin to 500 mg and ER to help with diarrhea -Recommend to continue NPH insulin 45 units BID for now -Recommend checking blood sugar 4 times daily - before meals and at bedtime -Follow up with [employer] (DME) regarding Freestyle Libre   Depression/Anxiety (Goal: manage symptoms) -controlled - per patient report -Current treatment: . Citalopram 20 mg daily -PHQ9: 0 (05/2020) -GAD7: not on file -Connected with PCP for mental health support -Educated on Benefits of medication for symptom control -Recommended to continue current medication  GERD / IBS (Goal: manage symptoms) -controlled, however she has to buy Nexium OTC for cost savings since it is Tier 3 with her insurance  plan -Current treatment  . Esomeprazole 20 mg daily (buys OTC) . Loperamide 2 mg PRN -Medications previously tried: omeprazole -Recommended switching to pantoprazole 40 mg  daily for cost savings (Tier 1, free with mail order pharmacy)  Neuropathy / Pain (Goal: manage pain) -uncontrolled - pt reports gabapentin 900 mg/day is not helping much with pain -Current treatment  . Gabapentin 300 mg TID . Aspercreme PRN . Tylenol 325 mg PRN -Recommended to continue current medication and follow up with PCP; pt may benefit from titrating gabapentin dose, however would need to be careful since pt already has issues with leg swelling  Transortation issues -pt reports she lives alone, cannot drive and depends on transportation provided by [facility] to get to doctor's visits, however they have failed to get her to her appt on time at least once in the past year and she was unable to contact them in a timely manner to fix the issue -pt depends on neighbors/church community to bring her to grocery store ; she would like to have a grocery delivery service but is not sure how to set one up. She canceled Mobile Meals due to poor food quality. -pt reports she has 2 appts tomorrow (wound care and PCP) and transportation plans have fallen through. -Coordinate with [facility] CSW for help with transportation/grocery services  Patient Goals/Self-Care Activities . Over the next 30 days, patient will:  - take medications as prescribed focus on medication adherence by pill box check glucose 4 times daily - before meals and at bedtime, document, and provide at future appointments check blood pressure daily, document, and provide at future appointments Keep appointments with [facility] social work and other providers  Follow Up Plan: Telephone follow up appointment with care management team member scheduled for: 1 month       Medication Assistance: None required.  Patient affirms current coverage meets needs.  Patient's preferred  pharmacy is:  [facility], [address] [place] [address] Phone: [phone] Fax: [phone]  [facility] [address], [address] [place], [place] [place] [address] Phone: [phone] Fax: [phone]  Uses pill box? Yes - monthly box Pt endorses 100% compliance  We discussed: Current pharmacy is preferred with insurance plan and patient is satisfied with pharmacy services Patient decided to: Continue current medication management strategy  Care Plan and Follow Up Patient Decision:  Patient agrees to Care Plan and Follow-up.  Plan: Telephone follow up appointment with care management team member scheduled for:  1 month  Charlene Brooke, PharmD, [facility] Clinical Pharmacist [facility] [phone]

## 2021-02-15 NOTE — Patient Instructions (Signed)
Visit Information  Phone number for Pharmacist: [phone]  Goals Addressed            This Visit's Progress   . Manage My Medicine   Not on track    Timeframe:  Long-Range Goal Priority:  High Start Date:     01/19/21                        Expected End Date:       07/19/21                Follow Up Date 03/08/21   - call for medicine refill 2 or 3 days before it runs out - call if I am sick and can't take my medicine - keep a list of all the medicines I take; vitamins and herbals too - use a pillbox to sort medicine  -Stop taking clopidogrel 75 mg (per cardiology stop date was 11/16/2020) -Switch metformin 1000 mg to metformin ER 500 mg twice a day -Switch OTC Nexium to pantoprazole 40 mg daily (free with mail order) -Follow up with [facility] social worker for help with transportation and groceries   Why is this important?   . These steps will help you keep on track with your medicines.     . Monitor and Manage My Blood Sugar-Diabetes Type 2   Not on track    Timeframe:  Long-Range Goal Priority:  High Start Date:         01/19/21                    Expected End Date:      07/19/21                 Follow Up Date 03/08/21   - check blood sugar at prescribed times - check blood sugar if I feel it is too high or too low - enter blood sugar readings and medication or insulin into daily log - take the blood sugar log to all doctor visits  -meet with pharmacist to begin [facility] continuous glucose monitoring   Why is this important?    Checking your blood sugar at home helps to keep it from getting very high or very low.   Writing the results in a diary or log helps the doctor know how to care for you.   Your blood sugar log should have the time, date and the results.   Also, write down the amount of insulin or other medicine that you take.   Other information, like what you ate, exercise done and how you were feeling, will also be helpful.       . Track and Manage  Fluids and Swelling-Heart Failure   Not on track    Timeframe:  Long-Range Goal Priority:  High Start Date:     01/19/21                        Expected End Date:    07/19/21                   Follow Up Date 03/08/21   - keep legs up while sitting - track weight in diary - use salt in moderation - watch for swelling in feet, ankles and legs every day - weigh myself daily  -Continue Taking furosemide 40 mg daily and follow up with PCP and cardiologist regularly to make adjustments   Why is  this important?    It is important to check your weight daily and watch how much salt and liquids you have.   It will help you to manage your heart failure.       Patient Care Plan: CCM Pharmacy Care Plan    Problem Identified: Hypertension, Hyperlipidemia, Diabetes, Coronary Artery Disease, GERD and Depression, Neuropathy   Priority: High    Long-Range Goal: Disease management   Start Date: 01/19/2021  Expected End Date: 07/19/2021  This Visit's Progress: Not on track  Recent Progress: On track  Priority: High  Note:   Current Barriers:  . Unable to independently monitor therapeutic efficacy . Unable to achieve control of diabetes  . Suboptimal therapeutic regimen for diabetes . Does not adhere to prescribed medication regimen . Does not maintain contact with provider office  Pharmacist Clinical Goal(s):  Marland Kitchen Over the next 30 days, patient will achieve adherence to monitoring guidelines and medication adherence to achieve therapeutic efficacy . achieve control of diabetes as evidenced by improved blood sugar at home . adhere to plan to optimize therapeutic regimen for diabetes as evidenced by report of adherence to recommended medication management changes through collaboration with PharmD and provider.   Interventions: . 1:1 collaboration with Myrlene Broker, MD regarding development and update of comprehensive plan of care as evidenced by provider attestation and  co-signature . Inter-disciplinary care team collaboration (see longitudinal plan of care) . Comprehensive medication review performed; medication list updated in electronic medical record  Heart Failure / Hypertension (Goal: BP goal < 130/80, control symptoms and prevent exacerbations) uncontrolled - per patient leg swelling is inhibiting ADLs. She did try 80 mg of Lasix one day without much improvement -pt reports 2 episodes of fast heart rate in past several months -Type: Diastolic (grade 2 dysfunction) -Ejection fraction: 60-65% (Date: 06/15/20) -Current treatment . Metoprolol tartrate 25 mg - 1/2 tab BID - not on dispense report . Furosemide 40 mg daily -Medications previously tried: lisinopril (stopped during hospitalization for AKI, never restarted -Current home BP/HR readings: unknown - BP monitor is broken -Current dietary habits: limited cooking due to leg swelling/pain; eats mainly sandwiches and frozen dinners -Current exercise routine: none due to leg swelling/pain -Educated on Benefits of medications for managing symptoms and prolonging life Importance of weighing daily; if you gain more than 3 pounds in one day or 5 pounds in one week, contact prescriber Importance of blood pressure control -Recommended to continue current medication Coordinated scheduling f/u PCP visit ASAP - pt may require higher dose of Lasix or more potent loop diurertic (ie, torsemide)  -May also consider re-starting lisinopril pending labwork and BP in office  Hyperlipidemia: (LDL goal < 70) -uncontrolled - LDL was above goal prior to NSTEMI, has not been checked since then -Current treatment: . Atorvastatin 40 mg daily - . Ezetimibe 10 mg daily . Clopidogrel 75 mg daily  . 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 She also reports taking Immodium daily with metformin to prevent diarrhea. She is overdue for A1c (  last 03/2020). She reports compliance with twice daily insulin, however per conversation and per chart review A1c has never improved significantly and she complains of pain from insulin needle, it is likely she misses insulin doses fairly often. -Pt reports she received a phone call from DME about Freestyle Josephine Igo but does not remember what was said. She also missed her PCP visit due to transportation issues so no med changes have been made. -Current medications: Marland Kitchen Metformin 1000 mg - 1/2 tab BID . Relion Novolin 70/30 45 units twice day ($25/vial at [facility], pt requires 3 vials/month) . Relion glucometer and test strips -Medications previously tried: Hospital doctor, Novolog -Current home glucose readings - not checking due to finger pain -Reports hyperglycemic symptoms - polydipsia and polyuria (pt is drinking 10 x 16oz water bottles per day due to feeling "dry" and thirsty) -Current meal patterns: avoids "white" foods - rice, pasta . lunch: sandwiches - 1 slice of bread . dinner: frozen dinner -Patient would likely benefit from a GLP-1 agonist. Recommend Rybelsus 3 mg, titrated to 14 mg over 3 months, since she is more likely to be compliant with a pill than an injection -Recommend changing metformin to 500 mg and ER to help with diarrhea -Recommend to continue NPH insulin 45 units BID for now -Recommend checking blood sugar 4 times daily - before meals and at bedtime -Follow up with [employer] (DME) regarding Freestyle Libre   Depression/Anxiety (Goal: manage symptoms) -controlled - per patient report -Current  treatment: . Citalopram 20 mg daily -PHQ9: 0 (05/2020) -GAD7: not on file -Connected with PCP for mental health support -Educated on Benefits of medication for symptom control -Recommended to continue current medication  GERD / IBS (Goal: manage symptoms) -controlled, however she has to buy Nexium OTC for cost savings since it is Tier 3 with her insurance plan -Current treatment  . Esomeprazole 20 mg daily (buys OTC) . Loperamide 2 mg PRN -Medications previously tried: omeprazole -Recommended switching to pantoprazole 40 mg daily for cost savings (Tier 1, free with mail order pharmacy)  Neuropathy / Pain (Goal: manage pain) -uncontrolled - pt reports gabapentin 900 mg/day is not helping much with pain -Current treatment  . Gabapentin 300 mg TID . Aspercreme PRN . Tylenol 325 mg PRN -Recommended to continue current medication and follow up with PCP; pt may benefit from titrating gabapentin dose, however would need to be careful since pt already has issues with leg swelling  Transortation issues -pt reports she lives alone, cannot drive and depends on transportation provided by [facility] to get to doctor's visits, however they have failed to get her to her appt on time at least once in the past year and she was unable to contact them in a timely manner to fix the issue -pt depends on neighbors/church community to bring her to grocery store ; she would like to have a grocery delivery service but is not sure how to set one up. She canceled Mobile Meals due to poor food quality. -pt reports she has 2 appts tomorrow (wound care and PCP) and transportation plans have fallen through. -Coordinate with [facility] CSW for help with transportation/grocery services  Patient Goals/Self-Care Activities . Over the next 30 days, patient will:  - take medications as prescribed focus on medication adherence by pill box check glucose 4 times daily - before meals and at bedtime, document, and provide at future  appointments check blood pressure daily, document, and provide at future appointments Keep appointments with [facility] social work and other  providers  Follow Up Plan: Telephone follow up appointment with care management team member scheduled for: 1 month     The patient verbalized understanding of instructions, educational materials, and care plan provided today and declined offer to receive copy of patient instructions, educational materials, and care plan.  Telephone follow up appointment with pharmacy team member scheduled for: 1 month  Al CorpusLindsey Foltanski, PharmD, [facility] CenterBCACP Clinical Pharmacist [facility] [phone]

## 2021-02-16 ENCOUNTER — Ambulatory Visit: Payer: Medicare Other | Admitting: Internal Medicine

## 2021-02-16 ENCOUNTER — Telehealth: Payer: Self-pay | Admitting: Pharmacist

## 2021-02-16 ENCOUNTER — Encounter (HOSPITAL_BASED_OUTPATIENT_CLINIC_OR_DEPARTMENT_OTHER): Payer: Medicare Other | Admitting: Physician Assistant

## 2021-02-16 ENCOUNTER — Encounter (HOSPITAL_BASED_OUTPATIENT_CLINIC_OR_DEPARTMENT_OTHER): Payer: Medicare Other | Admitting: Internal Medicine

## 2021-02-16 DIAGNOSIS — I2510 Atherosclerotic heart disease of native coronary artery without angina pectoris: Secondary | ICD-10-CM | POA: Diagnosis not present

## 2021-02-16 DIAGNOSIS — R6889 Other general symptoms and signs: Secondary | ICD-10-CM | POA: Diagnosis not present

## 2021-02-16 DIAGNOSIS — I87333 Chronic venous hypertension (idiopathic) with ulcer and inflammation of bilateral lower extremity: Secondary | ICD-10-CM | POA: Diagnosis not present

## 2021-02-16 DIAGNOSIS — L97211 Non-pressure chronic ulcer of right calf limited to breakdown of skin: Secondary | ICD-10-CM | POA: Diagnosis not present

## 2021-02-16 DIAGNOSIS — Z952 Presence of prosthetic heart valve: Secondary | ICD-10-CM | POA: Diagnosis not present

## 2021-02-16 DIAGNOSIS — I87323 Chronic venous hypertension (idiopathic) with inflammation of bilateral lower extremity: Secondary | ICD-10-CM | POA: Diagnosis not present

## 2021-02-16 DIAGNOSIS — I890 Lymphedema, not elsewhere classified: Secondary | ICD-10-CM | POA: Diagnosis not present

## 2021-02-16 DIAGNOSIS — L97221 Non-pressure chronic ulcer of left calf limited to breakdown of skin: Secondary | ICD-10-CM | POA: Diagnosis not present

## 2021-02-16 NOTE — Progress Notes (Signed)
BENNETTE, HASTY ([id number]) Visit Report for 02/16/2021 Arrival Information Details Patient Name: Date of Service: CHAMPAGNE, PALETTA 02/16/2021 10:30 A M Medical Record Number: [id number] Patient Account Number: [id number] Date of Birth/Sex: Treating RN: 1946-08-22 (75 y.o. [name], Lauren Primary Care Amy Belloso: Hillard Danker Other Clinician: Referring Rhilee Currin: Treating Eulis Salazar/Extender: Jolee Ewing in Treatment: 2 Visit Information History Since Last Visit Added or deleted any medications: No Patient Arrived: Ambulatory Any new allergies or adverse reactions: No Arrival Time: 09:45 Had a fall or experienced change in No Accompanied By: self activities of daily living that may affect Transfer Assistance: None risk of falls: Patient Identification Verified: Yes Signs or symptoms of abuse/neglect since last visito No Secondary Verification Process Completed: Yes Hospitalized since last visit: No Patient Requires Transmission-Based No Implantable device outside of the clinic excluding No Precautions: cellular tissue based products placed in the center Patient Has Alerts: Yes since last visit: Patient Alerts: ABI's: Non Compressible Has Dressing in Place as Prescribed: Yes Has Compression in Place as Prescribed: Yes Pain Present Now: No Electronic Signature(s) Signed: 02/16/2021 5:22:03 PM By: Fonnie Mu RN Entered By: Fonnie Mu on 02/16/2021 10:22:27 -------------------------------------------------------------------------------- Compression Therapy Details Patient Name: Date of Service: Gasper Sells F. 02/16/2021 10:30 A M Medical Record Number: [id number] Patient Account Number: [id number] Date of Birth/Sex: Treating RN: Sep 29, 1946 (75 y.o. [name], Lauren Primary Care Zaakirah Kistner: Hillard Danker Other Clinician: Referring Kazi Reppond: Treating Damain Broadus/Extender: Jolee Ewing  in Treatment: 2 Compression Therapy Performed for Wound Assessment: Wound #1 Right,Circumferential Lower Leg Performed By: Clinician Fonnie Mu, RN Compression Type: Three Layer Electronic Signature(s) Signed: 02/16/2021 5:22:03 PM By: Fonnie Mu RN Entered By: Fonnie Mu on 02/16/2021 10:24:06 -------------------------------------------------------------------------------- Compression Therapy Details Patient Name: Date of Service: Gasper Sells F. 02/16/2021 10:30 A M Medical Record Number: [id number] Patient Account Number: [id number] Date of Birth/Sex: Treating RN: 06-Jul-1946 (75 y.o. [name], Lauren Primary Care Eythan Jayne: Hillard Danker Other Clinician: Referring Naszir Cott: Treating Ayad Nieman/Extender: Jolee Ewing in Treatment: 2 Compression Therapy Performed for Wound Assessment: Wound #2 Left,Circumferential Lower Leg Performed By: Clinician Fonnie Mu, RN Compression Type: Three Layer Electronic Signature(s) Signed: 02/16/2021 5:22:03 PM By: Fonnie Mu RN Entered By: Fonnie Mu on 02/16/2021 10:24:06 -------------------------------------------------------------------------------- Encounter Discharge Information Details Patient Name: Date of Service: Gasper Sells F. 02/16/2021 10:30 A M Medical Record Number: [id number] Patient Account Number: [id number] Date of Birth/Sex: Treating RN: Apr 06, 1946 (75 y.o. [name], Lauren Primary Care Raizy Auzenne: Hillard Danker Other Clinician: Referring Mischell Branford: Treating Mineola Duan/Extender: Jolee Ewing in Treatment: 2 Encounter Discharge Information Items Discharge Condition: Stable Ambulatory Status: Walker Discharge Destination: Home Transportation: Private Auto Accompanied By: self Schedule Follow-up Appointment: Yes Clinical Summary of Care: Patient Declined Electronic Signature(s) Signed: 02/16/2021 5:22:03 PM  By: Fonnie Mu RN Entered By: Fonnie Mu on 02/16/2021 10:24:51 -------------------------------------------------------------------------------- Patient/Caregiver Education Details Patient Name: Date of Service: Darrelyn Hillock 3/11/2022andnbsp10:30 A M Medical Record Number: [id number] Patient Account Number: [id number] Date of Birth/Gender: Treating RN: [birth year] (75 y.o. [name], Lauren Primary Care Physician: Hillard Danker Other Clinician: Referring Physician: Treating Physician/Extender: Jolee Ewing in Treatment: 2 Education Assessment Education Provided To: Patient Education Topics Provided Venous: Methods: Explain/Verbal Responses: State content correctly Electronic Signature(s) Signed: 02/16/2021 5:22:03 PM By: Fonnie Mu RN Entered By: Fonnie Mu on 02/16/2021 10:24:30 -------------------------------------------------------------------------------- Wound Assessment Details Patient Name: Date of Service: Gasper Sells F. 02/16/2021 10:30 A  M Medical Record Number: [id number] Patient Account Number: [id number] Date of Birth/Sex: Treating RN: 13-May-1946 (75 y.o. [name], Lauren Primary Care Zakyra Kukuk: Hillard Danker Other Clinician: Referring Sereniti Wan: Treating Lakrisha Iseman/Extender: Jolee Ewing in Treatment: 2 Wound Status Wound Number: 1 Primary Etiology: Lymphedema Wound Location: Right, Circumferential Lower Leg Wound Status: Open Wounding Event: Gradually Appeared Date Acquired: 12/09/2020 Weeks Of Treatment: 2 Clustered Wound: No Wound Measurements Length: (cm) 17 Width: (cm) 22 Depth: (cm) 0.1 Area: (cm) 293.739 Volume: (cm) 29.374 % Reduction in Area: 0% % Reduction in Volume: 0% Wound Description Classification: Full Thickness Without Exposed Support Structur es Treatment Notes Wound #1 (Lower Leg) Wound Laterality: Right,  Circumferential Cleanser Peri-Wound Care Triamcinolone 15 (g) Discharge Instruction: Use triamcinolone 15 (g) as directed Sween Lotion (Moisturizing lotion) Discharge Instruction: Apply moisturizing lotion as directed Topical Primary Dressing KerraCel Ag Gelling Fiber Dressing, 4x5 in (silver alginate) Discharge Instruction: Apply silver alginate to wound bed as instructed Secondary Dressing ABD Pad, 8x10 Discharge Instruction: Apply over primary dressing as directed. Zetuvit Plus 4x8 in Discharge Instruction: Apply over primary dressing as directed. Secured With Compression Wrap ThreePress (3 layer compression wrap) Discharge Instruction: Apply three layer compression as directed. Compression Stockings Add-Ons Electronic Signature(s) Signed: 02/16/2021 5:22:03 PM By: Fonnie Mu RN Entered By: Fonnie Mu on 02/16/2021 10:23:42 -------------------------------------------------------------------------------- Wound Assessment Details Patient Name: Date of Service: Gasper Sells F. 02/16/2021 10:30 A M Medical Record Number: [id number] Patient Account Number: [id number] Date of Birth/Sex: Treating RN: 01-Oct-1946 (75 y.o. [name], Lauren Primary Care Aleese Kamps: Hillard Danker Other Clinician: Referring Jonatan Wilsey: Treating Jeson Camacho/Extender: Jolee Ewing in Treatment: 2 Wound Status Wound Number: 2 Primary Etiology: Lymphedema Wound Location: Left, Circumferential Lower Leg Wound Status: Open Wounding Event: Gradually Appeared Date Acquired: 12/09/2020 Weeks Of Treatment: 2 Clustered Wound: No Wound Measurements Length: (cm) 15 Width: (cm) 22 Depth: (cm) 0.1 Area: (cm) 259.181 Volume: (cm) 25.918 % Reduction in Area: 0% % Reduction in Volume: 0% Wound Description Classification: Full Thickness Without Exposed Support Structur es Treatment Notes Wound #2 (Lower Leg) Wound Laterality: Left,  Circumferential Cleanser Peri-Wound Care Triamcinolone 15 (g) Discharge Instruction: Use triamcinolone 15 (g) as directed Sween Lotion (Moisturizing lotion) Discharge Instruction: Apply moisturizing lotion as directed Topical Primary Dressing KerraCel Ag Gelling Fiber Dressing, 4x5 in (silver alginate) Discharge Instruction: Apply silver alginate to wound bed as instructed Secondary Dressing ABD Pad, 8x10 Discharge Instruction: Apply over primary dressing as directed. Zetuvit Plus 4x8 in Discharge Instruction: Apply over primary dressing as directed. Secured With Compression Wrap ThreePress (3 layer compression wrap) Discharge Instruction: Apply three layer compression as directed. Compression Stockings Add-Ons Electronic Signature(s) Signed: 02/16/2021 5:22:03 PM By: Fonnie Mu RN Signed: 02/16/2021 5:22:03 PM By: Fonnie Mu RN Entered By: Fonnie Mu on 02/16/2021 10:23:42 -------------------------------------------------------------------------------- Vitals Details Patient Name: Date of Service: Gasper Sells F. 02/16/2021 10:30 A M Medical Record Number: [id number] Patient Account Number: [id number] Date of Birth/Sex: Treating RN: December 15, 1945 (75 y.o. [name], Lauren Primary Care Zahki Hoogendoorn: Hillard Danker Other Clinician: Referring Ayjah Show: Treating Danna Sewell/Extender: Jolee Ewing in Treatment: 2 Vital Signs Time Taken: 09:45 Temperature (F): 97.8 Height (in): 64 Pulse (bpm): 97 Weight (lbs): 200 Respiratory Rate (breaths/min): 17 Body Mass Index (BMI): 34.3 Blood Pressure (mmHg): 172/80 Reference Range: 80 - 120 mg / dl Electronic Signature(s) Signed: 02/16/2021 5:22:03 PM By: Fonnie Mu RN Entered By: Fonnie Mu on 02/16/2021 10:23:31

## 2021-02-16 NOTE — Progress Notes (Signed)
[name], [name] ([id number]) Visit Report for 02/16/2021 SuperBill Details Patient Name: Date of Service: [name], [name] 02/16/2021 Medical Record Number: [id number] Patient Account Number: [id number] Date of Birth/Sex: Treating RN: 12/15/1945 (75 y.o. 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 Electronic Signature(s) Signed: 02/16/2021 5:22:03 PM By: Fonnie Mu RN Signed: 02/16/2021 7:21:59 PM By: Lenda Kelp PA-C Entered By: Fonnie Mu on 02/16/2021 10:25:03

## 2021-02-16 NOTE — Progress Notes (Signed)
A call was made to [facility] to check on the status of the patient order for [facility] that was placed on 01/26/21. The patient states that someone from the facility called but she is not sure what was said. I spoke with representative Molly Maduro) who said that it must have been the patient's coordinator who called. The representative gave me the number of the coordinator and her name. I then called the number I was given [phone] asked for the coordinator Lafayette Dragon and left a message on her vm to give me a call back or to call the patient back if there is any information that is still needed or if the order was approved.    Horald Pollen, CPA [facility] Pharmacist [phone]   Time spent:20

## 2021-02-21 DIAGNOSIS — E1165 Type 2 diabetes mellitus with hyperglycemia: Secondary | ICD-10-CM | POA: Diagnosis not present

## 2021-02-21 DIAGNOSIS — Z794 Long term (current) use of insulin: Secondary | ICD-10-CM | POA: Diagnosis not present

## 2021-02-22 ENCOUNTER — Encounter: Payer: Self-pay | Admitting: Internal Medicine

## 2021-02-22 ENCOUNTER — Other Ambulatory Visit: Payer: Self-pay

## 2021-02-22 ENCOUNTER — Ambulatory Visit (INDEPENDENT_AMBULATORY_CARE_PROVIDER_SITE_OTHER): Payer: Medicare Other | Admitting: Internal Medicine

## 2021-02-22 VITALS — BP 134/74 | HR 86 | Temp 99.0°F | Resp 18 | Ht 64.0 in | Wt 193.2 lb

## 2021-02-22 DIAGNOSIS — IMO0002 Reserved for concepts with insufficient information to code with codable children: Secondary | ICD-10-CM

## 2021-02-22 DIAGNOSIS — I10 Essential (primary) hypertension: Secondary | ICD-10-CM

## 2021-02-22 DIAGNOSIS — R6889 Other general symptoms and signs: Secondary | ICD-10-CM | POA: Diagnosis not present

## 2021-02-22 DIAGNOSIS — E1149 Type 2 diabetes mellitus with other diabetic neurological complication: Secondary | ICD-10-CM

## 2021-02-22 DIAGNOSIS — E1165 Type 2 diabetes mellitus with hyperglycemia: Secondary | ICD-10-CM | POA: Diagnosis not present

## 2021-02-22 DIAGNOSIS — R0789 Other chest pain: Secondary | ICD-10-CM | POA: Diagnosis not present

## 2021-02-22 DIAGNOSIS — I872 Venous insufficiency (chronic) (peripheral): Secondary | ICD-10-CM

## 2021-02-22 LAB — BRAIN NATRIURETIC PEPTIDE: Pro B Natriuretic peptide (BNP): 55 pg/mL (ref 0.0–100.0)

## 2021-02-22 LAB — CBC
HCT: 40.7 % (ref 36.0–46.0)
Hemoglobin: 13.6 g/dL (ref 12.0–15.0)
MCHC: 33.4 g/dL (ref 30.0–36.0)
MCV: 81.5 fl (ref 78.0–100.0)
Platelets: 223 10*3/uL (ref 150.0–400.0)
RBC: 4.99 Mil/uL (ref 3.87–5.11)
RDW: 13.9 % (ref 11.5–15.5)
WBC: 6.5 10*3/uL (ref 4.0–10.5)

## 2021-02-22 LAB — COMPREHENSIVE METABOLIC PANEL
ALT: 18 U/L (ref 0–35)
AST: 16 U/L (ref 0–37)
Albumin: 3.6 g/dL (ref 3.5–5.2)
Alkaline Phosphatase: 130 U/L — ABNORMAL HIGH (ref 39–117)
BUN: 17 mg/dL (ref 6–23)
CO2: 31 mEq/L (ref 19–32)
Calcium: 9.6 mg/dL (ref 8.4–10.5)
Chloride: 100 mEq/L (ref 96–112)
Creatinine, Ser: 0.68 mg/dL (ref 0.40–1.20)
GFR: 86.05 mL/min (ref >60.00)
Glucose, Bld: 336 mg/dL — ABNORMAL HIGH (ref 70–99)
Potassium: 4.8 mEq/L (ref 3.5–5.1)
Sodium: 136 mEq/L (ref 135–145)
Total Bilirubin: 0.6 mg/dL (ref 0.2–1.2)
Total Protein: 7.5 g/dL (ref 6.0–8.3)

## 2021-02-22 LAB — LIPID PANEL
Cholesterol: 175 mg/dL (ref 0–200)
HDL: 36.8 mg/dL — ABNORMAL LOW (ref >39.00)
LDL Cholesterol: 107 mg/dL — ABNORMAL HIGH (ref 0–99)
NonHDL: 138.34
Total CHOL/HDL Ratio: 5
Triglycerides: 155 mg/dL — ABNORMAL HIGH (ref 0.0–149.0)
VLDL: 31 mg/dL (ref 0.0–40.0)

## 2021-02-22 LAB — TROPONIN I (HIGH SENSITIVITY): High Sens Troponin I: 10 ng/L (ref 2–17)

## 2021-02-22 LAB — POCT GLYCOSYLATED HEMOGLOBIN (HGB A1C): Hemoglobin A1C: 11.6 % — AB (ref 4.0–5.6)

## 2021-02-22 MED ORDER — METOPROLOL TARTRATE 25 MG PO TABS
12.5000 mg | ORAL_TABLET | Freq: Two times a day (BID) | ORAL | 3 refills | Status: DC
Start: 2021-02-22 — End: 2021-10-04

## 2021-02-22 MED ORDER — RYBELSUS 3 MG PO TABS: 3.0000 mg | ORAL_TABLET | Freq: Every day | ORAL | 0 refills | Status: DC

## 2021-02-22 NOTE — Progress Notes (Unsigned)
Subjective:   Patient ID: [name], female    DOB: 1945/12/26, 75 y.o.   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   Review of Systems  Constitutional: Positive for activity change and fatigue.  HENT: Negative.   Eyes: Negative.   Respiratory: Negative for cough, chest tightness and shortness of breath.   Cardiovascular: Positive for chest pain and leg swelling. Negative for palpitations.  Gastrointestinal: Negative for abdominal distention, abdominal pain, constipation, diarrhea, nausea and vomiting.  Musculoskeletal: Positive for arthralgias and gait problem.  Skin: Negative.   Neurological: Positive for numbness.  Psychiatric/Behavioral: Negative.     Objective:  Physical Exam Constitutional:      Appearance: She is well-developed.  HENT:     Head: Normocephalic and atraumatic.  Cardiovascular:     Rate and Rhythm: Normal rate and regular rhythm.     Heart sounds: Murmur heard.    Pulmonary:     Effort: Pulmonary effort is normal. No respiratory  distress.     Breath sounds: Normal breath sounds. No wheezing or rales.  Chest:     Chest wall: No tenderness.  Abdominal:     General: Bowel sounds are normal. There is no distension.     Palpations: Abdomen is soft.     Tenderness: There is no abdominal tenderness. There is no rebound.  Musculoskeletal:     Cervical back: Normal range of motion.     Right lower leg: Edema present.     Left lower leg: Edema present.     Comments: Severe edema right and left leg, wrapped  Skin:    General: Skin is warm and dry.  Neurological:     Mental Status: She is alert and oriented to person, place, and time.     Coordination: Coordination abnormal.     Comments: Slow gait and difficulty with standing, walker     Vitals:   02/22/21 1449  BP: 134/74  Pulse: 86  Resp: 18  Temp: 99 F (37.2 C)  TempSrc: Oral  SpO2: 96%  Weight: 193 lb 3.2 oz (87.6 kg)  Height: 5\' 4"  (1.626 m)   Sitting upright EKG: Rate 85, axis normal, interval normal, sinus, no st or t wave changes, no significant change from 10/2020  This visit occurred during the SARS-CoV-2 public health emergency.  Safety protocols were in place, including screening questions prior to the visit, additional usage of staff PPE, and extensive cleaning of exam room while observing appropriate contact time as indicated for disinfecting solutions.   Assessment & Plan:  Visit time 30 minutes in face to face communication with patient and coordination of care, additional 10 minutes spent in record review, coordination or care, ordering tests, communicating/referring to other healthcare professionals, documenting in medical records all on the same day of the visit for total time 40 minutes spent on the visit.

## 2021-02-22 NOTE — Patient Instructions (Addendum)
Your EKG looks the same today as before.   We will have you restart the metoprolol 1/2 pill twice a day.   We are adding on rybelsus for the sugars to take 1 pill daily, come back in 1 month so we can adjust the dose if you are doing well.   We will double the dose of the fluid pill lasix (furosemide). Take 2 pills of this daily.

## 2021-02-23 ENCOUNTER — Encounter: Payer: Self-pay | Admitting: Internal Medicine

## 2021-02-23 ENCOUNTER — Encounter (HOSPITAL_BASED_OUTPATIENT_CLINIC_OR_DEPARTMENT_OTHER): Payer: Medicare Other | Admitting: Internal Medicine

## 2021-02-23 NOTE — Assessment & Plan Note (Signed)
Increase lasix to 80 mg daily to help with swelling and encouraged to continue going to wound clinic for wrapping.

## 2021-02-23 NOTE — Assessment & Plan Note (Signed)
EKG done which is not changed. Checking troponin and BNP today. Likely due to being out of metoprolol which is refilled and will increase lasix to help with leg swelling.

## 2021-02-23 NOTE — Assessment & Plan Note (Signed)
BP is marginal today and she is out of her metoprolol for several weeks. This is refilled and she is encouraged to call before running out of medications again as we can help with this. Also taking lasix which we have also increased to 80 mg daily.

## 2021-02-23 NOTE — Assessment & Plan Note (Signed)
POC HgA1c done today which is 11.6. This is severely uncontrolled and she has past NSTEMI and we discussed risk for future cardiac event from high sugars. She is willing to add rybelsus 3 mg daily and return in 1 month for titration if tolerating. She is not taking her insulin regularly and not taking metformin regularly so we will likely need additional changes in 1 month.

## 2021-02-27 NOTE — Addendum Note (Signed)
Addended by: Manuela Schwartz on: 02/27/2021 09:58 AM   Modules accepted: Orders

## 2021-03-01 ENCOUNTER — Other Ambulatory Visit: Payer: Self-pay | Admitting: Internal Medicine

## 2021-03-02 ENCOUNTER — Other Ambulatory Visit: Payer: Self-pay | Admitting: Internal Medicine

## 2021-03-02 MED ORDER — ATORVASTATIN CALCIUM 80 MG PO TABS: 80.0000 mg | ORAL_TABLET | Freq: Every day | ORAL | 3 refills | Status: DC

## 2021-03-05 ENCOUNTER — Other Ambulatory Visit: Payer: Self-pay | Admitting: Internal Medicine

## 2021-03-05 DIAGNOSIS — Z1231 Encounter for screening mammogram for malignant neoplasm of breast: Secondary | ICD-10-CM

## 2021-03-07 ENCOUNTER — Encounter (HOSPITAL_BASED_OUTPATIENT_CLINIC_OR_DEPARTMENT_OTHER): Payer: Medicare Other | Admitting: Physician Assistant

## 2021-03-07 ENCOUNTER — Other Ambulatory Visit: Payer: Self-pay

## 2021-03-07 ENCOUNTER — Other Ambulatory Visit (HOSPITAL_COMMUNITY)
Admission: RE | Admit: 2021-03-07 | Discharge: 2021-03-07 | Disposition: A | Discharge status: Home / Self Care | Payer: Medicare Other | Source: Other Acute Inpatient Hospital | Attending: Physician Assistant | Admitting: Physician Assistant

## 2021-03-07 DIAGNOSIS — I87333 Chronic venous hypertension (idiopathic) with ulcer and inflammation of bilateral lower extremity: Secondary | ICD-10-CM | POA: Diagnosis not present

## 2021-03-07 DIAGNOSIS — L97221 Non-pressure chronic ulcer of left calf limited to breakdown of skin: Secondary | ICD-10-CM | POA: Insufficient documentation

## 2021-03-07 DIAGNOSIS — L97822 Non-pressure chronic ulcer of other part of left lower leg with fat layer exposed: Secondary | ICD-10-CM | POA: Diagnosis not present

## 2021-03-07 DIAGNOSIS — L97211 Non-pressure chronic ulcer of right calf limited to breakdown of skin: Secondary | ICD-10-CM | POA: Insufficient documentation

## 2021-03-07 DIAGNOSIS — I2510 Atherosclerotic heart disease of native coronary artery without angina pectoris: Secondary | ICD-10-CM | POA: Diagnosis not present

## 2021-03-07 DIAGNOSIS — L97812 Non-pressure chronic ulcer of other part of right lower leg with fat layer exposed: Secondary | ICD-10-CM | POA: Diagnosis not present

## 2021-03-07 DIAGNOSIS — I87323 Chronic venous hypertension (idiopathic) with inflammation of bilateral lower extremity: Secondary | ICD-10-CM | POA: Diagnosis not present

## 2021-03-07 DIAGNOSIS — I890 Lymphedema, not elsewhere classified: Secondary | ICD-10-CM | POA: Diagnosis not present

## 2021-03-07 DIAGNOSIS — Z952 Presence of prosthetic heart valve: Secondary | ICD-10-CM | POA: Diagnosis not present

## 2021-03-07 NOTE — Progress Notes (Addendum)
Cynthia, [name] ([id number]) Visit Report for 03/07/2021 Chief Complaint Document Details Patient Name: Date of Service: [name], [name] 03/07/2021 11:15 A M Medical Record Number: [id number] Patient Account Number: [id number] Date of Birth/Sex: Treating RN: May 10, 1946 (75 y.o. [name] Primary Care Provider: Hillard Danker Other Clinician: Referring Provider: Treating Provider/Extender: [name] in Treatment: 4 Information Obtained from: Patient Chief Complaint 02/02/2021; patient is here for review of wounds on her bilateral lower leg Electronic Signature(s) Signed: 03/07/2021 11:25:20 AM By: Lenda Kelp PA-C Entered By: Lenda Kelp on 03/07/2021 11:25:20 -------------------------------------------------------------------------------- HPI Details Patient Name: Date of Service: Cynthia N, [name] 03/07/2021 11:15 A M Medical Record Number: [id number] Patient Account Number: [id number] Date of Birth/Sex: Treating RN: [birth year] (75 y.o. [name] Primary Care Provider: Hillard Danker Other Clinician: Referring Provider: Treating Provider/Extender: [name] in Treatment: 4 History of Present Illness HPI Description: ADMISSION 02/02/2021 This is a 75 year old woman who is referred from primary care for review of substantial epithelialized on the left and right lower legs. This is almost circumferential if you take into account significant inflammation. Very painful. The patient says that this has been there for roughly 2 months started as what she thinks was a boil but has not gotten better in fact is progressed. She is a type II diabetic and is listed as having a history of chronic venous insufficiency. During it 1 recent ER visit she was listed as having lymphedema. I do not see any arterial or venous studies in her record in epic. With careful questioning it is fairly clear that  the patient is also always had "puffy legs" but she cannot really give me a timeframe perhaps 5 or 10 years. She says things were a lot worse after her TAVR Past medical history includes aortic stenosis status post TAVR on 05/17/2020. She states the swelling was up came up a lot after this. Type 2 diabetes, coronary artery disease, orthostatic hypotension and chronic venous insufficiency Far too much edema in her lower legs to do ABIs but her pedal pulses are palpable bilaterally even through this much edema. 03/07/2021 on evaluation today patient appears to be doing poorly in regard to her wounds. Both legs are extremely swollen and she even appears to have some signs of cellulitis. Fortunately there is no evidence of active infection systemically which is great news. Electronic Signature(s) Signed: 03/07/2021 1:04:47 PM By: Lenda Kelp PA-C Entered By: Lenda Kelp on 03/07/2021 13:04:47 -------------------------------------------------------------------------------- Physical Exam Details Patient Name: Date of Service: [name] 03/07/2021 11:15 A M Medical Record Number: [id number] Patient Account Number: [id number] Date of Birth/Sex: Treating RN: 1946/01/19 (75 y.o. [name] Primary Care Provider: Hillard Danker Other Clinician: Referring Provider: Treating Provider/Extender: [name] in Treatment: 4 Constitutional Obese and well-hydrated in no acute distress. Respiratory normal breathing without difficulty. Psychiatric this patient is able to make decisions and demonstrates good insight into disease process. Alert and Oriented x 3. pleasant and cooperative. Notes Upon inspection currently the patient appears to have signs of cellulitis she has erythematous and warm to touch over her legs. They also are significantly swollen. She tells me that she cannot keep wraps on she has restless leg syndrome at night she moves some  much that it takes everything off. Nonetheless I really feel like with the compression wraps we put on this really should not be the case. We have not  seen her however in roughly 2 weeks due to the fact that she unfortunately was having transportation issues. Electronic Signature(s) Signed: 03/07/2021 1:05:53 PM By: Lenda Kelp PA-C Entered By: Lenda Kelp on 03/07/2021 13:05:53 -------------------------------------------------------------------------------- Physician Orders Details Patient Name: Date of Service: Cynthia N, [name] 03/07/2021 11:15 A M Medical Record Number: [id number] Patient Account Number: [id number] Date of Birth/Sex: Treating RN: 1946-05-17 (75 y.o. [name] Primary Care Provider: Hillard Danker Other Clinician: Referring Provider: Treating Provider/Extender: [name] in Treatment: 4 Verbal / Phone Orders: No Diagnosis Coding ICD-10 Coding Code Description 3166209045 Non-pressure chronic ulcer of right calf limited to breakdown of skin L97.221 Non-pressure chronic ulcer of left calf limited to breakdown of skin I89.0 Lymphedema, not elsewhere classified I87.323 Chronic venous hypertension (idiopathic) with inflammation of bilateral lower extremity Follow-up Appointments Return Appointment in 1 week. Bathing/ Shower/ Hygiene May shower with protection but do not get wound dressing(s) wet. - use cast protectors to cover wraps in the shower Edema Control - Lymphedema / SCD / Other Bilateral Lower Extremities Elevate legs to the level of the heart or above for 30 minutes daily and/or when sitting, a frequency of: - whenever sitting Avoid standing for long periods of time. Exercise regularly Wound Treatment Wound #1 - Lower Leg Wound Laterality: Right, Circumferential Peri-Wound Care: Triamcinolone 15 (g) 1 x Per Week/7 Days Discharge Instructions: Use triamcinolone 15 (g) as directed Peri-Wound Care: Sween  Lotion (Moisturizing lotion) 1 x Per Week/7 Days Discharge Instructions: Apply moisturizing lotion as directed Prim Dressing: KerraCel Ag Gelling Fiber Dressing, 4x5 in (silver alginate) 1 x Per Week/7 Days ary Discharge Instructions: Apply silver alginate to wound bed to any weeping areas Secondary Dressing: ABD Pad, 8x10 1 x Per Week/7 Days Discharge Instructions: Apply over primary dressing as directed. Secondary Dressing: Zetuvit Plus 4x8 in 1 x Per Week/7 Days Discharge Instructions: Apply over primary dressing as directed. Compression Wrap: ThreePress (3 layer compression wrap) 1 x Per Week/7 Days Discharge Instructions: Apply three layer compression as directed. Wound #2 - Lower Leg Wound Laterality: Left, Circumferential Peri-Wound Care: Triamcinolone 15 (g) 1 x Per Week/7 Days Discharge Instructions: Use triamcinolone 15 (g) as directed Peri-Wound Care: Sween Lotion (Moisturizing lotion) 1 x Per Week/7 Days Discharge Instructions: Apply moisturizing lotion as directed Prim Dressing: KerraCel Ag Gelling Fiber Dressing, 4x5 in (silver alginate) 1 x Per Week/7 Days ary Discharge Instructions: Apply silver alginate to wound bed to any weeping areas Secondary Dressing: ABD Pad, 8x10 1 x Per Week/7 Days Discharge Instructions: Apply over primary dressing as directed. Secondary Dressing: Zetuvit Plus 4x8 in 1 x Per Week/7 Days Discharge Instructions: Apply over primary dressing as directed. Compression Wrap: ThreePress (3 layer compression wrap) 1 x Per Week/7 Days Discharge Instructions: Apply three layer compression as directed. Patient Medications llergies: propoxyphene, meperidine, morphine HCl, codeine sulfate, Demerol A Notifications Medication Indication Start End 03/07/2021 Bactrim DS DOSE 1 - oral 800 mg-160 mg tablet - 1 tablet oral taken 2 times per day for 14 days. Do not take your potassium while on this medication Electronic Signature(s) Signed: 03/07/2021 1:08:04 PM  By: Lenda Kelp PA-C Entered By: Lenda Kelp on 03/07/2021 13:08:02 -------------------------------------------------------------------------------- Problem List Details Patient Name: Date of Service: Cynthia N, Kryslyn F. 03/07/2021 11:15 A M Medical Record Number: [id number] Patient Account Number: [id number] Date of Birth/Sex: Treating RN: [birth year] (75 y.o. [name] Primary Care Provider: Hillard Danker Other Clinician: Referring Provider: Treating Provider/Extender: Linwood Dibbles,  Mliss Sax, Lanora Manis Weeks in Treatment: 4 Active Problems ICD-10 Encounter Encounter Code Description Active Date MDM Diagnosis L97.211 Non-pressure chronic ulcer of right calf limited to breakdown of skin 02/02/2021 No Yes L97.221 Non-pressure chronic ulcer of left calf limited to breakdown of skin 02/02/2021 No Yes I89.0 Lymphedema, not elsewhere classified 02/02/2021 No Yes I87.323 Chronic venous hypertension (idiopathic) with inflammation of bilateral lower 02/02/2021 No Yes extremity Inactive Problems Resolved Problems Electronic Signature(s) Signed: 03/07/2021 11:25:15 AM By: Lenda Kelp PA-C Entered By: Lenda Kelp on 03/07/2021 11:25:14 -------------------------------------------------------------------------------- Progress Note Details Patient Name: Date of Service: Cynthia N, Latrell F. 03/07/2021 11:15 A M Medical Record Number: [id number] Patient Account Number: [id number] Date of Birth/Sex: Treating RN: [birth year] (75 y.o. [name] Primary Care Provider: Hillard Danker Other Clinician: Referring Provider: Treating Provider/Extender: [name] in Treatment: 4 Subjective Chief Complaint Information obtained from Patient 02/02/2021; patient is here for review of wounds on her bilateral lower leg History of Present Illness (HPI) ADMISSION 02/02/2021 This is a 75 year old woman who is referred from primary care  for review of substantial epithelialized on the left and right lower legs. This is almost circumferential if you take into account significant inflammation. Very painful. The patient says that this has been there for roughly 2 months started as what she thinks was a boil but has not gotten better in fact is progressed. She is a type II diabetic and is listed as having a history of chronic venous insufficiency. During it 1 recent ER visit she was listed as having lymphedema. I do not see any arterial or venous studies in her record in epic. With careful questioning it is fairly clear that the patient is also always had "puffy legs" but she cannot really give me a timeframe perhaps 5 or 10 years. She says things were a lot worse after her TAVR Past medical history includes aortic stenosis status post TAVR on 05/17/2020. She states the swelling was up came up a lot after this. Type 2 diabetes, coronary artery disease, orthostatic hypotension and chronic venous insufficiency Far too much edema in her lower legs to do ABIs but her pedal pulses are palpable bilaterally even through this much edema. 03/07/2021 on evaluation today patient appears to be doing poorly in regard to her wounds. Both legs are extremely swollen and she even appears to have some signs of cellulitis. Fortunately there is no evidence of active infection systemically which is great news. Objective Constitutional Obese and well-hydrated in no acute distress. Vitals Time Taken: 12:05 PM, Height: 64 in, Weight: 200 lbs, BMI: 34.3, Temperature: 98.4 F, Pulse: 88 bpm, Respiratory Rate: 17 breaths/min, Blood Pressure: 107/60 mmHg. Respiratory normal breathing without difficulty. Psychiatric this patient is able to make decisions and demonstrates good insight into disease process. Alert and Oriented x 3. pleasant and cooperative. General Notes: Upon inspection currently the patient appears to have signs of cellulitis she has erythematous  and warm to touch over her legs. They also are significantly swollen. She tells me that she cannot keep wraps on she has restless leg syndrome at night she moves some much that it takes everything off. Nonetheless I really feel like with the compression wraps we put on this really should not be the case. We have not seen her however in roughly 2 weeks due to the fact that she unfortunately was having transportation issues. Integumentary (Hair, Skin) Wound #1 status is Open. Original cause of wound was Gradually Appeared. The date  acquired was: 12/09/2020. The wound has been in treatment 4 weeks. The wound is located on the Right,Circumferential Lower Leg. The wound measures 17cm length x 18.5cm width x 0.1cm depth; 247.008cm^2 area and 24.701cm^3 volume. There is Fat Layer (Subcutaneous Tissue) exposed. There is no tunneling or undermining noted. There is a large amount of serous drainage noted. The wound margin is flat and intact. There is large (67-100%) red granulation within the wound bed. There is a small (1-33%) amount of necrotic tissue within the wound bed including Adherent Slough. Wound #2 status is Open. Original cause of wound was Gradually Appeared. The date acquired was: 12/09/2020. The wound has been in treatment 4 weeks. The wound is located on the Left,Circumferential Lower Leg. The wound measures 12cm length x 19cm width x 0.1cm depth; 179.071cm^2 area and 17.907cm^3 volume. There is Fat Layer (Subcutaneous Tissue) exposed. There is no tunneling or undermining noted. There is a large amount of serous drainage noted. The wound margin is flat and intact. There is large (67-100%) pink granulation within the wound bed. There is a small (1-33%) amount of necrotic tissue within the wound bed including Adherent Slough. 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 There was a Three Layer Compression Therapy Procedure by Fonnie MuBreedlove, Lauren, RN. Post procedure Diagnosis Wound #1: Same as Pre-Procedure Wound #2 Pre-procedure diagnosis of Wound #2 is a Lymphedema located on the Left,Circumferential Lower Leg . There was a Three Layer Compression Therapy Procedure by Fonnie MuBreedlove, Lauren, RN. Post procedure Diagnosis Wound #2: Same as Pre-Procedure Plan Follow-up Appointments: Return Appointment in 1 week. Bathing/ Shower/ Hygiene: May shower with protection but do not get wound dressing(s) wet. - use cast protectors to cover wraps in the shower Edema Control - Lymphedema / SCD / Other: Elevate legs to the level of the heart or above for 30 minutes daily and/or when sitting, a frequency of: - whenever sitting Avoid standing for long periods of time. Exercise regularly The following medication(s) was prescribed: Bactrim DS oral 800 mg-160 mg tablet 1 1 tablet oral taken 2 times per day for 14 days. Do not take your potassium while on this medication starting 03/07/2021 WOUND #1: - Lower Leg Wound Laterality: Right, Circumferential Peri-Wound Care: Triamcinolone 15 (g) 1 x Per Week/7 Days Discharge Instructions: Use triamcinolone 15 (g) as directed Peri-Wound Care: Sween Lotion (Moisturizing lotion) 1 x Per Week/7 Days Discharge Instructions: Apply moisturizing lotion as directed Prim Dressing: KerraCel Ag Gelling Fiber Dressing, 4x5 in (silver alginate) 1 x Per Week/7 Days ary Discharge Instructions: Apply silver alginate to wound bed to any weeping areas Secondary Dressing: ABD Pad, 8x10 1 x Per Week/7 Days Discharge Instructions: Apply over primary dressing as directed. Secondary Dressing: Zetuvit Plus 4x8  in 1 x Per Week/7 Days Discharge Instructions: Apply over primary dressing as directed. Com pression Wrap: ThreePress (3 layer compression wrap) 1 x Per Week/7 Days Discharge Instructions: Apply three layer compression as directed. WOUND #2: - Lower Leg Wound Laterality: Left, Circumferential Peri-Wound Care: Triamcinolone 15 (g) 1 x Per Week/7 Days Discharge Instructions: Use triamcinolone 15 (g) as directed Peri-Wound Care: Sween Lotion (Moisturizing lotion) 1 x Per Week/7 Days Discharge Instructions: Apply moisturizing lotion as directed Prim  Dressing: KerraCel Ag Gelling Fiber Dressing, 4x5 in (silver alginate) 1 x Per Week/7 Days ary Discharge Instructions: Apply silver alginate to wound bed to any weeping areas Secondary Dressing: ABD Pad, 8x10 1 x Per Week/7 Days Discharge Instructions: Apply over primary dressing as directed. Secondary Dressing: Zetuvit Plus 4x8 in 1 x Per Week/7 Days Discharge Instructions: Apply over primary dressing as directed. Com pression Wrap: ThreePress (3 layer compression wrap) 1 x Per Week/7 Days Discharge Instructions: Apply three layer compression as directed. 1. Would recommend currently that we go ahead and continue with the wound care measures as before the patient is in agreement with plan. This includes the use of the silver alginate dressing to the open wound locations. Obviously we will try to as much as possible keep the swelling edema under control were also using the tube it followed by the 3 layer compression wrap she probably needs a 4-layer but I think we need at least start here. 2. I am also can recommend that we go ahead and send in a prescription for her for Bactrim DS. I think this is probably can be a good broad-spectrum option. Depending on the results of the culture which was obtained today will make any adjustments in therapy as necessary. 3. I would also recommend that the patient needs to be elevating her legs much as possible try  to keep edema under good control. Obviously I think this is what of utmost importance. Still I understand that she has had a hard time of everything especially when it comes to her restless leg syndrome which seems to be collagen issues as well to be honest. We will see patient back for reevaluation in 1 week here in the clinic. If anything worsens or changes patient will contact our office for additional recommendations. Electronic Signature(s) Signed: 03/07/2021 1:08:09 PM By: Lenda Kelp PA-C Entered By: Lenda Kelp on 03/07/2021 13:08:09 -------------------------------------------------------------------------------- SuperBill Details Patient Name: Date of Service: Cynthia N, Kayleah F. 03/07/2021 Medical Record Number: [id number] Patient Account Number: [id number] Date of Birth/Sex: Treating RN: 04-Sep-1946 (75 y.o. [name] Primary Care Provider: Hillard Danker Other Clinician: Referring Provider: Treating Provider/Extender: [name] in Treatment: 4 Diagnosis Coding ICD-10 Codes Code Description [id number] Non-pressure chronic ulcer of right calf limited to breakdown of skin L97.221 Non-pressure chronic ulcer of left calf limited to breakdown of skin I89.0 Lymphedema, not elsewhere classified I87.323 Chronic venous hypertension (idiopathic) with inflammation of bilateral lower extremity Facility Procedures CPT4: Code [id number] foot Description: 1 BILATERAL: Application of multi-layer venous compression system; leg (below knee), including ankle and . Modifier: Quantity: 1 Physician Procedures : CPT4 Code Description Modifier [id number] - WC PHYS LEVEL 4 - EST PT ICD-10 Diagnosis Description L97.211 Non-pressure chronic ulcer of right calf limited to breakdown of skin L97.221 Non-pressure chronic ulcer of left calf limited to breakdown of  skin I89.0 Lymphedema, not elsewhere classified I87.323 Chronic venous hypertension  (idiopathic) with inflammation of bilateral lower extremity Quantity: 1 Electronic Signature(s) Signed: 03/07/2021 1:08:23 PM By: Lenda Kelp PA-C Entered By: Lenda Kelp on 03/07/2021 13:08:22

## 2021-03-08 NOTE — Progress Notes (Signed)
AMARISE, LILLO ([id number]) Visit Report for 03/07/2021 Arrival Information Details Patient Name: Date of Service: [name], [name] 03/07/2021 11:15 A M Medical Record Number: [id number] Patient Account Number: [id number] Date of Birth/Sex: Treating RN: Feb 27, 1946 (75 y.o. Tommye Standard Primary Care Gracen Southwell: Hillard Danker Other Clinician: Referring Acheron Sugg: Treating Chauncey Sciulli/Extender: Jolee Ewing in Treatment: 4 Visit Information History Since Last Visit Added or deleted any medications: No Patient Arrived: Wheel Chair Any new allergies or adverse reactions: No Arrival Time: 12:04 Had a fall or experienced change in No Accompanied By: friend activities of daily living that may affect Transfer Assistance: None risk of falls: Patient Identification Verified: Yes Signs or symptoms of abuse/neglect since last visito No Secondary Verification Process Completed: Yes Hospitalized since last visit: No Patient Requires Transmission-Based No Implantable device outside of the clinic excluding No Precautions: cellular tissue based products placed in the center Patient Has Alerts: Yes since last visit: Patient Alerts: ABI's: Non Compressible Has Dressing in Place as Prescribed: Yes Pain Present Now: Yes Electronic Signature(s) Signed: 03/07/2021 2:30:46 PM By: Karl Ito Entered By: Karl Ito on 03/07/2021 12:05:27 -------------------------------------------------------------------------------- Compression Therapy Details Patient Name: Date of Service: [name], [name] 03/07/2021 11:15 A M Medical Record Number: [id number] Patient Account Number: [id number] Date of Birth/Sex: Treating RN: October 19, 1946 (75 y.o. Tommye Standard Primary Care Jillian Warth: Hillard Danker Other Clinician: Referring Samuel Rittenhouse: Treating Kyli Sorter/Extender: Jolee Ewing in Treatment: 4 Compression Therapy Performed for  Wound Assessment: Wound #1 Right,Circumferential Lower Leg Performed By: Clinician Fonnie Mu, RN Compression Type: Three Layer Post Procedure Diagnosis Same as Pre-procedure Electronic Signature(s) Signed: 03/07/2021 5:50:20 PM By: Zenaida Deed RN, BSN Entered By: Zenaida Deed on 03/07/2021 12:28:36 -------------------------------------------------------------------------------- Compression Therapy Details Patient Name: Date of Service: [name], [name] 03/07/2021 11:15 A M Medical Record Number: [id number] Patient Account Number: [id number] Date of Birth/Sex: Treating RN: 06-21-1946 (75 y.o. Tommye Standard Primary Care Jerime Arif: Hillard Danker Other Clinician: Referring Marybel Alcott: Treating Hagen Tidd/Extender: Jolee Ewing in Treatment: 4 Compression Therapy Performed for Wound Assessment: Wound #2 Left,Circumferential Lower Leg Performed By: Clinician Fonnie Mu, RN Compression Type: Three Layer Post Procedure Diagnosis Same as Pre-procedure Electronic Signature(s) Signed: 03/07/2021 5:50:20 PM By: Zenaida Deed RN, BSN Entered By: Zenaida Deed on 03/07/2021 12:28:37 -------------------------------------------------------------------------------- Lower Extremity Assessment Details Patient Name: Date of Service: [name], [name] 03/07/2021 11:15 A M Medical Record Number: [id number] Patient Account Number: [id number] Date of Birth/Sex: Treating RN: 23-Sep-1946 (75 y.o. Wynelle Link Primary Care Nikolette Reindl: Hillard Danker Other Clinician: Referring Afra Tricarico: Treating Allon Costlow/Extender: Jolee Ewing in Treatment: 4 Edema Assessment Assessed: [Left: No] [Right: No] Edema: [Left: Yes] [Right: Yes] Calf Left: Right: Point of Measurement: 34 cm From Medial Instep 50.4 cm 51 cm Ankle Left: Right: Point of Measurement: 8 cm From Medial Instep 35 cm 31.5 cm Vascular  Assessment Pulses: Dorsalis Pedis Palpable: [Left:Yes] [Right:Yes] Electronic Signature(s) Signed: 03/08/2021 5:46:28 PM By: Zandra Abts RN, BSN Entered By: Zandra Abts on 03/07/2021 12:13:14 -------------------------------------------------------------------------------- Multi-Disciplinary Care Plan Details Patient Name: Date of Service: Cynthia Hillock. 03/07/2021 11:15 A M Medical Record Number: [id number] Patient Account Number: [id number] Date of Birth/Sex: Treating RN: 03-22-1946 (75 y.o. Tommye Standard Primary Care Kaydenn Mclear: Hillard Danker Other Clinician: Referring Londen Bok: Treating Elzie Sheets/Extender: Jolee Ewing in Treatment: 4 Multidisciplinary Care Plan reviewed with physician Active Inactive Venous Leg Ulcer Nursing Diagnoses: Knowledge deficit related to disease  process and management Potential for venous Insuffiency (use before diagnosis confirmed) Goals: Patient will maintain optimal edema control Date Initiated: 02/02/2021 Target Resolution Date: 04/04/2021 Goal Status: Active Interventions: Assess peripheral edema status every visit. Compression as ordered Provide education on venous insufficiency Treatment Activities: Therapeutic compression applied : 02/02/2021 Notes: Wound/Skin Impairment Nursing Diagnoses: Impaired tissue integrity Knowledge deficit related to ulceration/compromised skin integrity Goals: Patient/caregiver will verbalize understanding of skin care regimen Date Initiated: 02/02/2021 Target Resolution Date: 04/04/2021 Goal Status: Active Ulcer/skin breakdown will have a volume reduction of 30% by week 4 Date Initiated: 02/02/2021 Date Inactivated: 03/07/2021 Target Resolution Date: 03/02/2021 Unmet Reason: has not been keeping Goal Status: Unmet wraps on, missed appointments Ulcer/skin breakdown will have a volume reduction of 50% by week 8 Date Initiated: 03/07/2021 Target Resolution Date:  04/04/2021 Goal Status: Active Interventions: Assess patient/caregiver ability to obtain necessary supplies Assess patient/caregiver ability to perform ulcer/skin care regimen upon admission and as needed Assess ulceration(s) every visit Provide education on ulcer and skin care Treatment Activities: Skin care regimen initiated : 02/02/2021 Topical wound management initiated : 02/02/2021 Notes: Electronic Signature(s) Signed: 03/07/2021 5:50:20 PM By: Zenaida Deed RN, BSN Entered By: Zenaida Deed on 03/07/2021 12:27:37 -------------------------------------------------------------------------------- Pain Assessment Details Patient Name: Date of Service: [name], [name] 03/07/2021 11:15 A M Medical Record Number: [id number] Patient Account Number: [id number] Date of Birth/Sex: Treating RN: 06-Mar-1946 (75 y.o. Tommye Standard Primary Care Basir Niven: Other Clinician: Hillard Danker Referring Cathi Hazan: Treating Skila Rollins/Extender: Jolee Ewing in Treatment: 4 Active Problems Location of Pain Severity and Description of Pain Patient Has Paino Yes Site Locations Rate the pain. Current Pain Level: 9 Pain Management and Medication Current Pain Management: Electronic Signature(s) Signed: 03/07/2021 2:30:46 PM By: Karl Ito Signed: 03/07/2021 5:50:20 PM By: Zenaida Deed RN, BSN Entered By: Karl Ito on 03/07/2021 12:07:49 -------------------------------------------------------------------------------- Patient/Caregiver Education Details Patient Name: Date of Service: Cynthia Hillock 3/30/2022andnbsp11:15 A M Medical Record Number: [id number] Patient Account Number: [id number] Date of Birth/Gender: Treating RN: [birth year] (75 y.o. Tommye Standard Primary Care Physician: Hillard Danker Other Clinician: Referring Physician: Treating Physician/Extender: Jolee Ewing in Treatment:  4 Education Assessment Education Provided To: Patient Education Topics Provided Venous: Methods: Explain/Verbal Responses: Reinforcements needed, State content correctly Wound/Skin Impairment: Methods: Explain/Verbal Responses: Reinforcements needed, State content correctly Electronic Signature(s) Signed: 03/07/2021 5:50:20 PM By: Zenaida Deed RN, BSN Entered By: Zenaida Deed on 03/07/2021 12:27:55 -------------------------------------------------------------------------------- Wound Assessment Details Patient Name: Date of Service: [name], [name] 03/07/2021 11:15 A M Medical Record Number: [id number] Patient Account Number: [id number] Date of Birth/Sex: Treating RN: July 09, 1946 (75 y.o. Wynelle Link Primary Care Jaylen Claude: Hillard Danker Other Clinician: Referring Creig Landin: Treating Jumaane Weatherford/Extender: Jolee Ewing in Treatment: 4 Wound Status Wound Number: 1 Primary Lymphedema Etiology: Wound Location: Right, Circumferential Lower Leg Wound Open Wounding Event: Gradually Appeared Status: Date Acquired: 12/09/2020 Comorbid Hypertension, Hypotension, Myocardial Infarction, Peripheral Weeks Of Treatment: 4 History: Venous Disease, Type II Diabetes, Neuropathy Clustered Wound: No Photos Wound Measurements Length: (cm) 17 Width: (cm) 18.5 Depth: (cm) 0.1 Area: (cm) 247.008 Volume: (cm) 24.701 % Reduction in Area: 15.9% % Reduction in Volume: 15.9% Epithelialization: Small (1-33%) Tunneling: No Undermining: No Wound Description Classification: Full Thickness Without Exposed Support Structures Wound Margin: Flat and Intact Exudate Amount: Large Exudate Type: Serous Exudate Color: amber Foul Odor After Cleansing: No Slough/Fibrino Yes Wound Bed Granulation Amount: Large (67-100%) Exposed Structure Granulation Quality: Red Fascia Exposed: No Necrotic  Amount: Small (1-33%) Fat Layer (Subcutaneous Tissue) Exposed:  Yes Necrotic Quality: Adherent Slough Tendon Exposed: No Muscle Exposed: No Joint Exposed: No Bone Exposed: No Electronic Signature(s) Signed: 03/07/2021 5:25:01 PM By: Karl Ito Signed: 03/08/2021 5:46:28 PM By: Zandra Abts RN, BSN Entered By: Karl Ito on 03/07/2021 17:18:18 -------------------------------------------------------------------------------- Wound Assessment Details Patient Name: Date of Service: [name], [name] 03/07/2021 11:15 A M Medical Record Number: [id number] Patient Account Number: [id number] Date of Birth/Sex: Treating RN: Nov 12, 1946 (75 y.o. Wynelle Link Primary Care Hero Mccathern: Hillard Danker Other Clinician: Referring Leathia Farnell: Treating Boneta Standre/Extender: Jolee Ewing in Treatment: 4 Wound Status Wound Number: 2 Primary Lymphedema Etiology: Wound Location: Left, Circumferential Lower Leg Wound Open Wounding Event: Gradually Appeared Status: Date Acquired: 12/09/2020 Comorbid Hypertension, Hypotension, Myocardial Infarction, Peripheral Weeks Of Treatment: 4 History: Venous Disease, Type II Diabetes, Neuropathy Clustered Wound: No Photos Wound Measurements Length: (cm) 12 Width: (cm) 19 Depth: (cm) 0.1 Area: (cm) 179.071 Volume: (cm) 17.907 % Reduction in Area: 30.9% % Reduction in Volume: 30.9% Epithelialization: Small (1-33%) Tunneling: No Undermining: No Wound Description Classification: Full Thickness Without Exposed Support Structures Wound Margin: Flat and Intact Exudate Amount: Large Exudate Type: Serous Exudate Color: amber Foul Odor After Cleansing: No Slough/Fibrino Yes Wound Bed Granulation Amount: Large (67-100%) Exposed Structure Granulation Quality: Pink Fascia Exposed: No Necrotic Amount: Small (1-33%) Fat Layer (Subcutaneous Tissue) Exposed: Yes Necrotic Quality: Adherent Slough Tendon Exposed: No Muscle Exposed: No Joint Exposed: No Bone Exposed:  No Electronic Signature(s) Signed: 03/07/2021 5:25:01 PM By: Karl Ito Signed: 03/08/2021 5:46:28 PM By: Zandra Abts RN, BSN Entered By: Karl Ito on 03/07/2021 17:18:03 -------------------------------------------------------------------------------- Vitals Details Patient Name: Date of Service: Cynthia Sells F. 03/07/2021 11:15 A M Medical Record Number: [id number] Patient Account Number: [id number] Date of Birth/Sex: Treating RN: 02-27-1946 (75 y.o. Tommye Standard Primary Care Amelita Risinger: Hillard Danker Other Clinician: Referring Zyasia Halbleib: Treating Zahirah Cheslock/Extender: Jolee Ewing in Treatment: 4 Vital Signs Time Taken: 12:05 Temperature (F): 98.4 Height (in): 64 Pulse (bpm): 88 Weight (lbs): 200 Respiratory Rate (breaths/min): 17 Body Mass Index (BMI): 34.3 Blood Pressure (mmHg): 107/60 Reference Range: 80 - 120 mg / dl Electronic Signature(s) Signed: 03/07/2021 2:30:46 PM By: Karl Ito Entered By: Karl Ito on 03/07/2021 12:07:42

## 2021-03-11 LAB — AEROBIC CULTURE W GRAM STAIN (SUPERFICIAL SPECIMEN): Gram Stain: NONE SEEN

## 2021-03-14 ENCOUNTER — Other Ambulatory Visit: Payer: Self-pay

## 2021-03-14 ENCOUNTER — Encounter (HOSPITAL_COMMUNITY): Payer: Self-pay

## 2021-03-14 ENCOUNTER — Encounter (HOSPITAL_BASED_OUTPATIENT_CLINIC_OR_DEPARTMENT_OTHER): Payer: Medicare Other | Attending: Physician Assistant | Admitting: Physician Assistant

## 2021-03-14 ENCOUNTER — Inpatient Hospital Stay (HOSPITAL_COMMUNITY)
Admission: EM | Admit: 2021-03-14 | Discharge: 2021-03-18 | DRG: 602 | Disposition: A | Discharge status: Home Health | Payer: Medicare Other | Attending: Student | Admitting: Student

## 2021-03-14 DIAGNOSIS — E1151 Type 2 diabetes mellitus with diabetic peripheral angiopathy without gangrene: Secondary | ICD-10-CM | POA: Diagnosis present

## 2021-03-14 DIAGNOSIS — L03116 Cellulitis of left lower limb: Principal | ICD-10-CM | POA: Diagnosis present

## 2021-03-14 DIAGNOSIS — E1165 Type 2 diabetes mellitus with hyperglycemia: Secondary | ICD-10-CM | POA: Diagnosis present

## 2021-03-14 DIAGNOSIS — E11628 Type 2 diabetes mellitus with other skin complications: Secondary | ICD-10-CM | POA: Diagnosis not present

## 2021-03-14 DIAGNOSIS — I2510 Atherosclerotic heart disease of native coronary artery without angina pectoris: Secondary | ICD-10-CM | POA: Diagnosis not present

## 2021-03-14 DIAGNOSIS — L03119 Cellulitis of unspecified part of limb: Secondary | ICD-10-CM | POA: Diagnosis not present

## 2021-03-14 DIAGNOSIS — I872 Venous insufficiency (chronic) (peripheral): Secondary | ICD-10-CM | POA: Diagnosis not present

## 2021-03-14 DIAGNOSIS — L97812 Non-pressure chronic ulcer of other part of right lower leg with fat layer exposed: Secondary | ICD-10-CM | POA: Diagnosis not present

## 2021-03-14 DIAGNOSIS — I110 Hypertensive heart disease with heart failure: Secondary | ICD-10-CM | POA: Diagnosis present

## 2021-03-14 DIAGNOSIS — E1169 Type 2 diabetes mellitus with other specified complication: Secondary | ICD-10-CM | POA: Diagnosis present

## 2021-03-14 DIAGNOSIS — R600 Localized edema: Secondary | ICD-10-CM | POA: Diagnosis not present

## 2021-03-14 DIAGNOSIS — Z9114 Patient's other noncompliance with medication regimen: Secondary | ICD-10-CM | POA: Diagnosis not present

## 2021-03-14 DIAGNOSIS — E785 Hyperlipidemia, unspecified: Secondary | ICD-10-CM | POA: Diagnosis present

## 2021-03-14 DIAGNOSIS — I252 Old myocardial infarction: Secondary | ICD-10-CM | POA: Diagnosis not present

## 2021-03-14 DIAGNOSIS — L97822 Non-pressure chronic ulcer of other part of left lower leg with fat layer exposed: Secondary | ICD-10-CM | POA: Diagnosis not present

## 2021-03-14 DIAGNOSIS — Z7982 Long term (current) use of aspirin: Secondary | ICD-10-CM | POA: Diagnosis not present

## 2021-03-14 DIAGNOSIS — L03115 Cellulitis of right lower limb: Secondary | ICD-10-CM | POA: Diagnosis present

## 2021-03-14 DIAGNOSIS — Z7984 Long term (current) use of oral hypoglycemic drugs: Secondary | ICD-10-CM | POA: Diagnosis not present

## 2021-03-14 DIAGNOSIS — Z20822 Contact with and (suspected) exposure to covid-19: Secondary | ICD-10-CM | POA: Diagnosis present

## 2021-03-14 DIAGNOSIS — Z90710 Acquired absence of both cervix and uterus: Secondary | ICD-10-CM | POA: Diagnosis not present

## 2021-03-14 DIAGNOSIS — Z951 Presence of aortocoronary bypass graft: Secondary | ICD-10-CM | POA: Diagnosis not present

## 2021-03-14 DIAGNOSIS — Z9119 Patient's noncompliance with other medical treatment and regimen: Secondary | ICD-10-CM

## 2021-03-14 DIAGNOSIS — E876 Hypokalemia: Secondary | ICD-10-CM | POA: Diagnosis not present

## 2021-03-14 DIAGNOSIS — Z952 Presence of prosthetic heart valve: Secondary | ICD-10-CM

## 2021-03-14 DIAGNOSIS — L89152 Pressure ulcer of sacral region, stage 2: Secondary | ICD-10-CM | POA: Diagnosis present

## 2021-03-14 DIAGNOSIS — Z794 Long term (current) use of insulin: Secondary | ICD-10-CM | POA: Diagnosis not present

## 2021-03-14 DIAGNOSIS — R5381 Other malaise: Secondary | ICD-10-CM | POA: Diagnosis not present

## 2021-03-14 DIAGNOSIS — I5033 Acute on chronic diastolic (congestive) heart failure: Secondary | ICD-10-CM | POA: Diagnosis not present

## 2021-03-14 DIAGNOSIS — E669 Obesity, unspecified: Secondary | ICD-10-CM | POA: Diagnosis present

## 2021-03-14 DIAGNOSIS — Z9049 Acquired absence of other specified parts of digestive tract: Secondary | ICD-10-CM | POA: Diagnosis not present

## 2021-03-14 DIAGNOSIS — I10 Essential (primary) hypertension: Secondary | ICD-10-CM | POA: Diagnosis present

## 2021-03-14 DIAGNOSIS — E1142 Type 2 diabetes mellitus with diabetic polyneuropathy: Secondary | ICD-10-CM | POA: Diagnosis not present

## 2021-03-14 DIAGNOSIS — Z79899 Other long term (current) drug therapy: Secondary | ICD-10-CM

## 2021-03-14 DIAGNOSIS — Z6833 Body mass index (BMI) 33.0-33.9, adult: Secondary | ICD-10-CM

## 2021-03-14 DIAGNOSIS — Z833 Family history of diabetes mellitus: Secondary | ICD-10-CM

## 2021-03-14 DIAGNOSIS — R609 Edema, unspecified: Secondary | ICD-10-CM

## 2021-03-14 DIAGNOSIS — I890 Lymphedema, not elsewhere classified: Secondary | ICD-10-CM | POA: Diagnosis not present

## 2021-03-14 DIAGNOSIS — L97512 Non-pressure chronic ulcer of other part of right foot with fat layer exposed: Secondary | ICD-10-CM | POA: Diagnosis not present

## 2021-03-14 LAB — CBC WITH DIFFERENTIAL/PLATELET
Abs Immature Granulocytes: 0.03 10*3/uL (ref 0.00–0.07)
Basophils Absolute: 0.1 10*3/uL (ref 0.0–0.1)
Basophils Relative: 1 %
Eosinophils Absolute: 0.4 10*3/uL (ref 0.0–0.5)
Eosinophils Relative: 5 %
HCT: 45.9 % (ref 36.0–46.0)
Hemoglobin: 14.6 g/dL (ref 12.0–15.0)
Immature Granulocytes: 0 %
Lymphocytes Relative: 19 %
Lymphs Abs: 1.6 10*3/uL (ref 0.7–4.0)
MCH: 26.8 pg (ref 26.0–34.0)
MCHC: 31.8 g/dL (ref 30.0–36.0)
MCV: 84.2 fL (ref 80.0–100.0)
Monocytes Absolute: 0.6 10*3/uL (ref 0.1–1.0)
Monocytes Relative: 7 %
Neutro Abs: 5.6 10*3/uL (ref 1.7–7.7)
Neutrophils Relative %: 68 %
Platelets: 231 10*3/uL (ref 150–400)
RBC: 5.45 MIL/uL — ABNORMAL HIGH (ref 3.87–5.11)
RDW: 12.9 % (ref 11.5–15.5)
WBC: 8.2 10*3/uL (ref 4.0–10.5)
nRBC: 0 % (ref 0.0–0.2)

## 2021-03-14 LAB — COMPREHENSIVE METABOLIC PANEL
ALT: 20 U/L (ref 0–44)
AST: 16 U/L (ref 15–41)
Albumin: 3.8 g/dL (ref 3.5–5.0)
Alkaline Phosphatase: 180 U/L — ABNORMAL HIGH (ref 38–126)
Anion gap: 8 (ref 5–15)
BUN: 15 mg/dL (ref 8–23)
CO2: 30 mmol/L (ref 22–32)
Calcium: 9.5 mg/dL (ref 8.9–10.3)
Chloride: 100 mmol/L (ref 98–111)
Creatinine, Ser: 0.83 mg/dL (ref 0.44–1.00)
GFR, Estimated: >60 mL/min (ref >60)
Glucose, Bld: 330 mg/dL — ABNORMAL HIGH (ref 70–99)
Potassium: 4.3 mmol/L (ref 3.5–5.1)
Sodium: 138 mmol/L (ref 135–145)
Total Bilirubin: 0.3 mg/dL (ref 0.3–1.2)
Total Protein: 8.4 g/dL — ABNORMAL HIGH (ref 6.5–8.1)

## 2021-03-14 LAB — CBG MONITORING, ED: Glucose-Capillary: 308 mg/dL — ABNORMAL HIGH (ref 70–99)

## 2021-03-14 LAB — GLUCOSE, CAPILLARY
Glucose-Capillary: 316 mg/dL — ABNORMAL HIGH (ref 70–99)
Glucose-Capillary: 263 mg/dL — ABNORMAL HIGH (ref 70–99)

## 2021-03-14 LAB — LACTIC ACID, PLASMA
Lactic Acid, Venous: 1 mmol/L (ref 0.5–1.9)
Lactic Acid, Venous: 1.1 mmol/L (ref 0.5–1.9)

## 2021-03-14 LAB — HEMOGLOBIN A1C
Hgb A1c MFr Bld: 12.6 % — ABNORMAL HIGH (ref 4.8–5.6)
Mean Plasma Glucose: 314.92 mg/dL

## 2021-03-14 LAB — RESP PANEL BY RT-PCR (FLU A&B, COVID) ARPGX2
Influenza A by PCR: NEGATIVE
Influenza B by PCR: NEGATIVE
SARS Coronavirus 2 by RT PCR: NEGATIVE

## 2021-03-14 LAB — BRAIN NATRIURETIC PEPTIDE: B Natriuretic Peptide: 116.8 pg/mL — ABNORMAL HIGH (ref 0.0–100.0)

## 2021-03-14 MED ORDER — FUROSEMIDE 10 MG/ML IJ SOLN
20.0000 mg | Freq: Once | INTRAMUSCULAR | Status: AC
  Administered 2021-03-14: 20 mg via INTRAVENOUS
  Filled 2021-03-14: qty 4

## 2021-03-14 MED ORDER — PANTOPRAZOLE SODIUM 40 MG PO TBEC
40.0000 mg | DELAYED_RELEASE_TABLET | Freq: Every day | ORAL | Status: DC
  Administered 2021-03-15 – 2021-03-18 (×4): 40 mg via ORAL
  Filled 2021-03-14 (×4): qty 1

## 2021-03-14 MED ORDER — EZETIMIBE 10 MG PO TABS
10.0000 mg | ORAL_TABLET | Freq: Every day | ORAL | Status: DC
  Administered 2021-03-15 – 2021-03-18 (×4): 10 mg via ORAL
  Filled 2021-03-14 (×3): qty 1

## 2021-03-14 MED ORDER — SODIUM CHLORIDE 0.9 % IV SOLN
INTRAVENOUS | Status: DC | PRN
  Administered 2021-03-14: 250 mL via INTRAVENOUS

## 2021-03-14 MED ORDER — VANCOMYCIN HCL IN DEXTROSE 1-5 GM/200ML-% IV SOLN: 1000.0000 mg | Freq: Once | INTRAVENOUS | Status: DC

## 2021-03-14 MED ORDER — CITALOPRAM HYDROBROMIDE 20 MG PO TABS
20.0000 mg | ORAL_TABLET | Freq: Every day | ORAL | Status: DC
  Administered 2021-03-15 – 2021-03-18 (×4): 20 mg via ORAL
  Filled 2021-03-14 (×4): qty 2

## 2021-03-14 MED ORDER — ACETAMINOPHEN 325 MG PO TABS
650.0000 mg | ORAL_TABLET | Freq: Four times a day (QID) | ORAL | Status: DC | PRN
  Administered 2021-03-14 – 2021-03-18 (×5): 650 mg via ORAL
  Filled 2021-03-14 (×6): qty 2

## 2021-03-14 MED ORDER — INSULIN ASPART 100 UNIT/ML ~~LOC~~ SOLN
0.0000 [IU] | Freq: Three times a day (TID) | SUBCUTANEOUS | Status: DC
  Administered 2021-03-14: 15 [IU] via SUBCUTANEOUS
  Administered 2021-03-15: 3 [IU] via SUBCUTANEOUS
  Administered 2021-03-15 (×2): 4 [IU] via SUBCUTANEOUS
  Administered 2021-03-16 (×2): 7 [IU] via SUBCUTANEOUS
  Administered 2021-03-16 – 2021-03-18 (×4): 3 [IU] via SUBCUTANEOUS
  Administered 2021-03-18: 4 [IU] via SUBCUTANEOUS
  Filled 2021-03-14: qty 0.2

## 2021-03-14 MED ORDER — VANCOMYCIN HCL 1500 MG/300ML IV SOLN
1500.0000 mg | INTRAVENOUS | Status: DC
  Administered 2021-03-15: 1500 mg via INTRAVENOUS
  Filled 2021-03-14 (×2): qty 300

## 2021-03-14 MED ORDER — PIPERACILLIN-TAZOBACTAM 3.375 G IVPB 30 MIN
3.3750 g | Freq: Once | INTRAVENOUS | Status: AC
  Administered 2021-03-14: 3.375 g via INTRAVENOUS
  Filled 2021-03-14: qty 50

## 2021-03-14 MED ORDER — GABAPENTIN 300 MG PO CAPS
300.0000 mg | ORAL_CAPSULE | Freq: Three times a day (TID) | ORAL | Status: DC
  Administered 2021-03-14 – 2021-03-18 (×11): 300 mg via ORAL
  Filled 2021-03-14 (×11): qty 1

## 2021-03-14 MED ORDER — ASPIRIN EC 81 MG PO TBEC
81.0000 mg | DELAYED_RELEASE_TABLET | Freq: Every day | ORAL | Status: DC
  Administered 2021-03-15 – 2021-03-18 (×4): 81 mg via ORAL
  Filled 2021-03-14 (×4): qty 1

## 2021-03-14 MED ORDER — METOPROLOL TARTRATE 12.5 MG HALF TABLET
12.5000 mg | ORAL_TABLET | Freq: Two times a day (BID) | ORAL | Status: DC
  Administered 2021-03-14 – 2021-03-17 (×7): 12.5 mg via ORAL
  Filled 2021-03-14 (×9): qty 1

## 2021-03-14 MED ORDER — FUROSEMIDE 10 MG/ML IJ SOLN
40.0000 mg | Freq: Two times a day (BID) | INTRAMUSCULAR | Status: DC
  Administered 2021-03-14 – 2021-03-18 (×8): 40 mg via INTRAVENOUS
  Filled 2021-03-14 (×8): qty 4

## 2021-03-14 MED ORDER — ENOXAPARIN SODIUM 40 MG/0.4ML ~~LOC~~ SOLN
40.0000 mg | SUBCUTANEOUS | Status: DC
  Administered 2021-03-14 – 2021-03-17 (×4): 40 mg via SUBCUTANEOUS
  Filled 2021-03-14 (×4): qty 0.4

## 2021-03-14 MED ORDER — ATORVASTATIN CALCIUM 40 MG PO TABS
80.0000 mg | ORAL_TABLET | Freq: Every day | ORAL | Status: DC
  Administered 2021-03-15 – 2021-03-18 (×4): 80 mg via ORAL
  Filled 2021-03-14 (×4): qty 2

## 2021-03-14 MED ORDER — ACETAMINOPHEN 650 MG RE SUPP: 650.0000 mg | Freq: Four times a day (QID) | RECTAL | Status: DC | PRN

## 2021-03-14 MED ORDER — INSULIN ASPART 100 UNIT/ML ~~LOC~~ SOLN
4.0000 [IU] | Freq: Three times a day (TID) | SUBCUTANEOUS | Status: DC
  Administered 2021-03-14 – 2021-03-18 (×11): 4 [IU] via SUBCUTANEOUS
  Filled 2021-03-14: qty 0.04

## 2021-03-14 MED ORDER — INSULIN GLARGINE 100 UNIT/ML ~~LOC~~ SOLN
20.0000 [IU] | Freq: Every day | SUBCUTANEOUS | Status: DC
  Administered 2021-03-14: 20 [IU] via SUBCUTANEOUS
  Filled 2021-03-14 (×2): qty 0.2

## 2021-03-14 MED ORDER — INSULIN ASPART 100 UNIT/ML ~~LOC~~ SOLN
0.0000 [IU] | Freq: Every day | SUBCUTANEOUS | Status: DC
  Administered 2021-03-14: 3 [IU] via SUBCUTANEOUS
  Filled 2021-03-14: qty 0.05

## 2021-03-14 MED ORDER — VANCOMYCIN HCL IN DEXTROSE 1-5 GM/200ML-% IV SOLN
1000.0000 mg | Freq: Once | INTRAVENOUS | Status: AC
  Administered 2021-03-14: 1000 mg via INTRAVENOUS
  Filled 2021-03-14: qty 200

## 2021-03-14 NOTE — ED Triage Notes (Signed)
Pt arrived via walk in, c/o increased swelling to bilateral legs. States ongoing, recent changes to lasix rx. States worsening the last 24 hrs.

## 2021-03-14 NOTE — H&P (Signed)
History and Physical        Hospital Admission Note Date: 03/14/2021  Patient name: [name] Medical record number: [id number] Date of birth: [birth year] Age: 75 y.o. Gender: female  PCP: Myrlene Broker, MD    Chief Complaint    Chief Complaint  Patient presents with  . Leg Swelling      HPI:   This is a 75 year old female with past medical history of type 2 diabetes, hypertension, hyperlipidemia, TAVR, chronic venous insufficiency, chronic wounds who has been having ongoing bilateral lower extremity pain, weeping and swelling despite 1 week of Bactrim and increased PO Lasix and was referred to the ED by her wound care specialist for further evaluation of cellulitis. She hasn't had any fevers. Poor urinary output despite lasix as well. Otherwise denies any complaints.    ED Course: Afebrile, hemodynamically stable, on room air. Notable Labs: Glucose 330 otherwise labs unremarkable. Patient received Lasix 20 mg IV x1, vancomycin, Zosyn.    Vitals:   03/14/21 1352 03/14/21 1445  BP: (!) 165/76 137/78  Pulse: 89 83  Resp: 20 19  Temp: 98.1 F (36.7 C)   SpO2: 100% 94%     Review of Systems:  Review of Systems  All other systems reviewed and are negative.   Medical/Social/Family History   Past Medical History: Past Medical History:  Diagnosis Date  . Achilles tendinitis   . Acute renal failure (ARF) (HCC) 12/19/2014  . Calcaneal spur    right  . Cataract   . Depression   . Diabetes mellitus    type II  . Diverticulosis of colon (without mention of hemorrhage) 2013  . GERD (gastroesophageal reflux disease)   . GI bleed 03/26/14  . Hyperlipidemia   . Hypertension   . Internal hemorrhoids 2013  . Peripheral neuropathy   . Right shoulder pain    Subacromial tendinitis  . 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 Laneta Simmers and Excell Seltzer  . Venous insufficiency   . Ventral hernia     Past Surgical History:  Procedure Laterality Date  . ABDOMINAL HYSTERECTOMY  2001   TAH-BSO  . CHOLECYSTECTOMY  2001  . COLONOSCOPY  2013   diverticulosis   . ESOPHAGOGASTRODUODENOSCOPY  2013   normal   . INCISIONAL HERNIA REPAIR    . RIGHT/LEFT HEART CATH AND CORONARY ANGIOGRAPHY N/A 05/09/2020   Procedure: RIGHT/LEFT HEART CATH AND CORONARY ANGIOGRAPHY;  Surgeon: Corky Crafts, MD;  Location: [facility] INVASIVE CV LAB;  Service: Cardiovascular;  Laterality: N/A;  . TEE WITHOUT CARDIOVERSION N/A 05/17/2020   Procedure: TRANSESOPHAGEAL ECHOCARDIOGRAM (TEE);  Surgeon: Tonny Bollman, MD;  Location: [facility] INVASIVE CV LAB;  Service: Open Heart Surgery;  Laterality: N/A;  . TONSILLECTOMY    . TRANSCATHETER AORTIC VALVE REPLACEMENT, TRANSFEMORAL N/A 05/17/2020   Procedure: TRANSCATHETER AORTIC VALVE REPLACEMENT, TRANSFEMORAL;  Surgeon: Tonny Bollman, MD;  Location: [facility] INVASIVE CV LAB;  Service: Open Heart Surgery;  Laterality: N/A;    Medications: Prior to Admission medications   Medication Sig Start Date End Date Taking? Authorizing Provider  acetaminophen (TYLENOL) 325 MG tablet Take 325 mg by mouth  daily as needed for mild pain or headache.    [provider]  amoxicillin (AMOXIL) 500 MG capsule Take 4 capsules by mouth 1 hour prior to any dental work 06/15/20   [name], Kathryn R, PA-C  aspirin 81 MG tablet Take 81 mg by mouth daily.    [provider]  atorvastatin (LIPITOR) 80 MG tablet Take 1 tablet (80 mg total) by mouth daily. 03/02/21   Myrlene Brokerrawford, [name], MD  citalopram (CELEXA) 20 MG tablet TAKE 1 TABLET BY MOUTH  DAILY 08/07/20   Myrlene Brokerrawford, [name], MD  Continuous Blood Gluc Sensor (FREESTYLE LIBRE SENSOR SYSTEM) MISC Use to monitor sugars 03/20/20   Myrlene Brokerrawford, [name], MD  esomeprazole (NEXIUM) 20 MG capsule Take 1 capsule (20 mg total)  by mouth daily at 12 noon. 03/07/20   Myrlene Brokerrawford, [name], MD  ezetimibe (ZETIA) 10 MG tablet Take 1 tablet (10 mg total) by mouth daily. 03/02/21   Myrlene Brokerrawford, [name], MD  furosemide (LASIX) 40 MG tablet Take 1 tablet (40 mg total) by mouth daily. 06/15/20   [name], Kathryn R, PA-C  gabapentin (NEURONTIN) 300 MG capsule TAKE 1 CAPSULE BY MOUTH 3  TIMES DAILY 11/23/20   Myrlene Brokerrawford, [name], MD  glucose blood [facility](ONETOUCH VERIO) test strip Use as instructed 03/07/20   Myrlene Brokerrawford, [name], MD  insulin NPH-regular Human (70-30) 100 UNIT/ML injection Inject 45 Units into the skin 2 (two) times daily with a meal.    [provider]  Insulin Pen Needle 31G X 8 MM MISC Use to inject insulin four times daily E11.41 04/06/15   Myrlene Brokerrawford, [name], MD  metFORMIN (GLUCOPHAGE) 1000 MG tablet TAKE ONE-HALF TABLET BY  MOUTH TWICE DAILY WITH A  MEAL 01/17/21   Myrlene Brokerrawford, [name], MD  metoprolol tartrate (LOPRESSOR) 25 MG tablet Take 0.5 tablets (12.5 mg total) by mouth 2 (two) times daily. 02/22/21   Myrlene Brokerrawford, [name], MD  Semaglutide (RYBELSUS) 3 MG TABS Take 3 mg by mouth daily. 02/22/21   Myrlene Brokerrawford, [name], MD  sodium chloride (OCEAN) 0.65 % SOLN nasal spray Place 1 spray into both nostrils 4 (four) times daily. 03/07/20   Myrlene Brokerrawford, [name], MD  tetrahydrozoline 0.05 % ophthalmic solution Place 1-2 drops into both eyes 2 (two) times daily as needed (Dry eyes).    [provider]  Trolamine Salicylate (ASPERCREME EX) Apply 1 application topically daily as needed (shoulder/hips/knee/knuckles).    [provider]    Allergies:   Allergies  Allergen Reactions  . Morphine Other (See Comments)    'took me out of this world' I had to be resuscitated  . Codeine Sulfate Other (See Comments)    GI upset and pain  . Demerol [Meperidine] Nausea And Vomiting  . Meperidine Hcl Nausea And Vomiting and Swelling  . Propoxyphene Hcl Other (See Comments)    Darvocet caused sick  headache    Social History:  reports that she has never smoked. She has never used smokeless tobacco. She reports that she does not drink alcohol and does not use drugs.  Family History: Family History  Problem Relation Age of Onset  . Heart failure Father        Died in 2480s  . CVA Father   . Ovarian cancer Sister   . Diabetes Sister   . Other Sister        died at birth  . Heart failure Mother        Died in 6580s  . Other  Brother        drowned  . Diabetes Paternal Grandmother   . Breast cancer Paternal Aunt   . Colon cancer Neg Hx      Objective   Physical Exam: Blood pressure 137/78, pulse 83, temperature 98.1 F (36.7 C), temperature source Oral, resp. rate 19, height 5\' 4"  (1.626 m), weight 87.5 kg, SpO2 94 %.  Physical Exam Vitals and nursing note reviewed.  Constitutional:      Appearance: Normal appearance.  HENT:     Head: Normocephalic and atraumatic.  Eyes:     Conjunctiva/sclera: Conjunctivae normal.  Cardiovascular:     Rate and Rhythm: Normal rate and regular rhythm.     Heart sounds: Murmur heard.    Pulmonary:     Effort: Pulmonary effort is normal.     Breath sounds: Normal breath sounds.  Abdominal:     General: Abdomen is flat.     Palpations: Abdomen is soft.  Musculoskeletal:     Right lower leg: Edema present.     Left lower leg: Edema present.  Skin:    Findings: Rash present.     Comments: Nonpurulent rash with erythema, warmth, tenderness  Neurological:     Mental Status: She is alert. Mental status is at baseline.  Psychiatric:        Mood and Affect: Mood normal.        Behavior: Behavior normal.    (Pictures below from ED provider)          LABS on Admission: I have personally reviewed all the labs and imaging below    Basic Metabolic Panel: Recent Labs  Lab 03/14/21 1333  NA 138  K 4.3  CL 100  CO2 30  GLUCOSE 330*  BUN 15  CREATININE 0.83  CALCIUM 9.5   Liver Function Tests: Recent Labs  Lab  03/14/21 1333  AST 16  ALT 20  ALKPHOS 180*  BILITOT 0.3  PROT 8.4*  ALBUMIN 3.8   No results for input(s): LIPASE, AMYLASE in the last 168 hours. No results for input(s): AMMONIA in the last 168 hours. CBC: Recent Labs  Lab 03/14/21 1333  WBC 8.2  NEUTROABS 5.6  HGB 14.6  HCT 45.9  MCV 84.2  PLT 231   Cardiac Enzymes: No results for input(s): CKTOTAL, CKMB, CKMBINDEX, TROPONINI in the last 168 hours. BNP: Invalid input(s): POCBNP CBG: Recent Labs  Lab 03/14/21 1352 03/14/21 1649  GLUCAP 308* 316*    Radiological Exams on Admission:  No results found.    EKG: Not done   A & P   Principal Problem:   Cellulitis in diabetic foot (HCC) Active Problems:   Hyperlipidemia associated with type 2 diabetes mellitus (HCC)   Essential hypertension   Venous (peripheral) insufficiency   S/P TAVR (transcatheter aortic valve replacement)   1. Bilateral lower extremity rash concerning for nonpurulent cellulitis in poorly controlled diabetic a. Failed one week of bactrim and has a history of MRSA b. Will start on Vancomycin for now to cover for MRSA despite no purulence or abnormal labs c. If no improvement, then consider non infectious etiology  2. Bilateral lower extremity edema a. Poor urinary output with increased home PO lasix b. This is likely contributing to her cellulitis risk c. Will start Lasix 40 mg IV BID for now  3. Type 2 Diabetes with hyperglycemia a. Holding home meds b. Basal-bolus Insulin dosing  4. Chronic bilateral lower extremity wounds with chronic venous insufficiency a. WOCN  5.  Hypertension a. Continue home Lopressor  6. Hyperlipidemia a. Continue home statin and zetia  7. S/p TAVR a. Continue aspirin   DVT prophylaxis: lovenox   Code Status: Full Code  Diet: heart healthy/carb modified Family Communication: Admission, patients condition and plan of care including tests being ordered have been discussed with the patient who  indicates understanding and agrees with the plan and Code Status.   Disposition Plan: The appropriate patient status for this patient is OBSERVATION. Observation status is judged to be reasonable and necessary in order to provide the required intensity of service to ensure the patient's safety. The patient's presenting symptoms, physical exam findings, and initial radiographic and laboratory data in the context of their medical condition is felt to place them at decreased risk for further clinical deterioration. Furthermore, it is anticipated that the patient will be medically stable for discharge from the hospital within 2 midnights of admission. The following factors support the patient status of observation.   " The patient's presenting symptoms include lower extremity rash. " The physical exam findings include rash. " The initial radiographic and laboratory data are unremarkable.      Consultants  . none  Procedures  . none  Time Spent on Admission: 60 minutes    Jae Dire, DO [facility]  03/14/2021, 5:20 PM

## 2021-03-14 NOTE — ED Notes (Signed)
Provider at bedside to assess.

## 2021-03-14 NOTE — ED Provider Notes (Signed)
[facility]-EMERGENCY DEPT Provider Note   CSN: [id number] Arrival date & time: 03/14/21  1218     History Chief Complaint  Patient presents with  . Leg Swelling    [name] is a 75 y.o. female with a past medical history of diabetes, GERD, hypertension presenting to the ED with a chief complaint of leg swelling.  Patient has been going to wound care for the past several months due to chronic lower extremity wounds.  She reported some increased redness and drainage from the area 2 weeks ago and was placed on Bactrim.  She followed up today in their office and was found to have persistent erythema and drainage.  She was told to come to the ER for possible IV antibiotics.  She states that the swelling has gradually worsened for the past few months but denies any drastic change.  Denies any chest pain, shortness of breath, fever, abdominal pain, vomiting or diarrhea.  HPI     Past Medical History:  Diagnosis Date  . Achilles tendinitis   . Acute renal failure (ARF) (HCC) 12/19/2014  . Calcaneal spur    right  . Cataract   . Depression   . Diabetes mellitus    type II  . Diverticulosis of colon (without mention of hemorrhage) 2013  . GERD (gastroesophageal reflux disease)   . GI bleed 03/26/14  . Hyperlipidemia   . Hypertension   . Internal hemorrhoids 2013  . Peripheral neuropathy   . Right shoulder pain    Subacromial tendinitis  . 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 Laneta SimmersBartle and Excell Seltzerooper  . Venous insufficiency   . Ventral hernia     Patient Active Problem List   Diagnosis Date Noted  . 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 Syncope 05/11/2020  . Pressure injury of skin 05/11/2020  . Orthostatic hypotension 05/11/2020  . Dental caries 05/11/2020  . Aortic stenosis   . Diabetic neuropathy (HCC) 11/13/2018  . Left knee pain  10/24/2017  . Greater trochanteric bursitis of left hip 10/24/2017  . Atypical chest pain 10/10/2015  . AKI (acute kidney injury) (HCC) 12/19/2014  . Reactive airway disease 04/09/2014  . Seasonal allergic rhinitis 04/09/2014  . Preventative health care 04/09/2014  . Abnormality of gait 03/17/2013  . Irritable bowel syndrome (IBS) 08/27/2012  . Type II diabetes mellitus with neurological manifestations, 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  . GERD 06/18/2007  . Hyperlipidemia associated with type 2 diabetes mellitus (HCC) 12/27/2006  . Essential hypertension 09/19/2006  . Venous (peripheral) insufficiency 09/19/2006    Past Surgical History:  Procedure Laterality Date  . ABDOMINAL HYSTERECTOMY  2001   TAH-BSO  . CHOLECYSTECTOMY  2001  . COLONOSCOPY  2013   diverticulosis   . ESOPHAGOGASTRODUODENOSCOPY  2013   normal   . INCISIONAL HERNIA REPAIR    . RIGHT/LEFT HEART CATH AND CORONARY ANGIOGRAPHY N/A 05/09/2020   Procedure: RIGHT/LEFT HEART CATH AND CORONARY ANGIOGRAPHY;  Surgeon: Corky CraftsVaranasi, Jayadeep S, MD;  [facility];  Service: Cardiovascular;  Laterality: N/A;  . TEE WITHOUT CARDIOVERSION N/A 05/17/2020   Procedure: TRANSESOPHAGEAL ECHOCARDIOGRAM (TEE);  Surgeon: Tonny Bollmanooper, Michael, MD;  [facility];  Service: Open Heart Surgery;  Laterality: N/A;  . TONSILLECTOMY    . TRANSCATHETER AORTIC VALVE REPLACEMENT, TRANSFEMORAL N/A 05/17/2020   Procedure: TRANSCATHETER AORTIC VALVE REPLACEMENT, TRANSFEMORAL;  Surgeon: Tonny Bollmanooper, Michael, MD;  Location: [facility] INVASIVE CV LAB;  Service: Open Heart Surgery;  Laterality: N/A;     OB History   No obstetric history on file.     Family History  Problem Relation Age of Onset  . Heart failure Father        Died in 21s  . CVA Father   . Ovarian cancer Sister   . Diabetes Sister   . Other Sister        died at birth  . Heart failure Mother        Died in 80s  . Other Brother        drowned  . Diabetes Paternal  Grandmother   . Breast cancer Paternal Aunt   . Colon cancer Neg Hx     Social History   Tobacco Use  . Smoking status: Never Smoker  . Smokeless tobacco: Never Used  Substance Use Topics  . Alcohol use: No  . Drug use: No    Home Medications Prior to Admission medications   Medication Sig Start Date End Date Taking? Authorizing Provider  acetaminophen (TYLENOL) 325 MG tablet Take 325 mg by mouth daily as needed for mild pain or headache.    [provider]  amoxicillin (AMOXIL) 500 MG capsule Take 4 capsules by mouth 1 hour prior to any dental work 06/15/20   Janetta Hora, PA-C  aspirin 81 MG tablet Take 81 mg by mouth daily.    [provider]  atorvastatin (LIPITOR) 80 MG tablet Take 1 tablet (80 mg total) by mouth daily. 03/02/21   Myrlene Broker, MD  citalopram (CELEXA) 20 MG tablet TAKE 1 TABLET BY MOUTH  DAILY 08/07/20   Myrlene Broker, MD  Continuous Blood Gluc Sensor (FREESTYLE LIBRE SENSOR SYSTEM) MISC Use to monitor sugars 03/20/20   Myrlene Broker, MD  esomeprazole (NEXIUM) 20 MG capsule Take 1 capsule (20 mg total) by mouth daily at 12 noon. 03/07/20   Myrlene Broker, MD  ezetimibe (ZETIA) 10 MG tablet Take 1 tablet (10 mg total) by mouth daily. 03/02/21   Myrlene Broker, MD  furosemide (LASIX) 40 MG tablet Take 1 tablet (40 mg total) by mouth daily. 06/15/20   Janetta Hora, PA-C  gabapentin (NEURONTIN) 300 MG capsule TAKE 1 CAPSULE BY MOUTH 3  TIMES DAILY 11/23/20   Myrlene Broker, MD  glucose blood [facility] VERIO) test strip Use as instructed 03/07/20   Myrlene Broker, MD  insulin NPH-regular Human (70-30) 100 UNIT/ML injection Inject 45 Units into the skin 2 (two) times daily with a meal.    [provider]  Insulin Pen Needle 31G X 8 MM MISC Use to inject insulin four times daily E11.41 04/06/15   Myrlene Broker, MD  metFORMIN (GLUCOPHAGE) 1000 MG tablet TAKE ONE-HALF TABLET BY   MOUTH TWICE DAILY WITH A  MEAL 01/17/21   Myrlene Broker, MD  metoprolol tartrate (LOPRESSOR) 25 MG tablet Take 0.5 tablets (12.5 mg total) by mouth 2 (two) times daily. 02/22/21   Myrlene Broker, MD  Semaglutide (RYBELSUS) 3 MG TABS Take 3 mg by mouth daily. 02/22/21   Myrlene Broker, MD  sodium chloride (OCEAN) 0.65 % SOLN nasal spray Place 1 spray into both nostrils 4 (four) times daily. 03/07/20   Myrlene Broker, MD  tetrahydrozoline 0.05 % ophthalmic solution Place 1-2 drops into both eyes 2 (two) times daily as needed (Dry eyes).    [provider]  Terrance Mass  Salicylate (ASPERCREME EX) Apply 1 application topically daily as needed (shoulder/hips/knee/knuckles).    [provider]    Allergies    Morphine, Codeine sulfate, Demerol [meperidine], Meperidine hcl, and Propoxyphene hcl  Review of Systems   Review of Systems  Constitutional: Negative for appetite change, chills and fever.  HENT: Negative for ear pain, rhinorrhea, sneezing and sore throat.   Eyes: Negative for photophobia and visual disturbance.  Respiratory: Negative for cough, chest tightness, shortness of breath and wheezing.   Cardiovascular: Positive for leg swelling. Negative for chest pain and palpitations.  Gastrointestinal: Negative for abdominal pain, blood in stool, constipation, diarrhea, nausea and vomiting.  Genitourinary: Negative for dysuria, hematuria and urgency.  Musculoskeletal: Negative for myalgias.  Skin: Positive for wound. Negative for rash.  Neurological: Negative for dizziness, weakness and light-headedness.    Physical Exam Updated Vital Signs BP (!) 165/76 (BP Location: Left Arm)   Pulse 89   Temp 98.1 F (36.7 C) (Oral)   Resp 20   Ht 5\' 4"  (1.626 m)   Wt 87.5 kg   SpO2 100%   BMI 33.13 kg/m   Physical Exam Vitals and nursing note reviewed.  Constitutional:      General: She is not in acute distress.    Appearance: She is well-developed.   HENT:     Head: Normocephalic and atraumatic.     Nose: Nose normal.  Eyes:     General: No scleral icterus.       Left eye: No discharge.     Conjunctiva/sclera: Conjunctivae normal.  Cardiovascular:     Rate and Rhythm: Normal rate and regular rhythm.     Heart sounds: Normal heart sounds. No murmur heard. No friction rub. No gallop.   Pulmonary:     Effort: Pulmonary effort is normal. No respiratory distress.     Breath sounds: Normal breath sounds.  Abdominal:     General: Bowel sounds are normal. There is no distension.     Palpations: Abdomen is soft.     Tenderness: There is no abdominal tenderness. There is no guarding.  Musculoskeletal:        General: Normal range of motion.     Cervical back: Normal range of motion and neck supple.     Right lower leg: Edema present.     Left lower leg: Edema present.     Comments: Significant lower extremity edema noted bilaterally.  Several areas of erythema and drainage noted.  Does appear warm to touch.  Moving extremities without difficulty  Skin:    General: Skin is warm and dry.     Findings: No rash.  Neurological:     Mental Status: She is alert.     Motor: No abnormal muscle tone.     Coordination: Coordination normal.               ED Results / Procedures / Treatments   Labs (all labs ordered are listed, but only abnormal results are displayed) Labs Reviewed  COMPREHENSIVE METABOLIC PANEL - Abnormal; Notable for the following components:      Result Value   Glucose, Bld 330 (*)    Total Protein 8.4 (*)    Alkaline Phosphatase 180 (*)    All other components within normal limits  CBC WITH DIFFERENTIAL/PLATELET - Abnormal; Notable for the following components:   RBC 5.45 (*)    All other components within normal limits  CBG MONITORING, ED - Abnormal; Notable for the following components:  Glucose-Capillary 308 (*)    All other components within normal limits  CULTURE, BLOOD (ROUTINE X 2)  CULTURE,  BLOOD (ROUTINE X 2)  RESP PANEL BY RT-PCR (FLU A&B, COVID) ARPGX2  LACTIC ACID, PLASMA  LACTIC ACID, PLASMA  BRAIN NATRIURETIC PEPTIDE    EKG None  Radiology No results found.  Procedures Procedures   Medications Ordered in ED Medications  piperacillin-tazobactam (ZOSYN) IVPB 3.375 g (has no administration in time range)  vancomycin (VANCOCIN) IVPB 1000 mg/200 mL premix (has no administration in time range)  furosemide (LASIX) injection 20 mg (20 mg Intravenous Given 03/14/21 1456)    ED Course  I have reviewed the triage vital signs and the nursing notes.  Pertinent labs & imaging results that were available during my care of the patient were reviewed by me and considered in my medical decision making (see chart for details).  Clinical Course as of 03/14/21 1456  Wed Mar 14, 2021  1354 WBC: 8.2 [HK]  1424 Lactic Acid, Venous: 1.0 [HK]    Clinical Course User Index [HK] Dietrich Pates, PA-C   MDM Rules/Calculators/A&P                          75 year old female with past medical history of diabetes, GERD, hypertension presenting to the ED with a chief complaint of leg swelling.  Patient has been going to wound care for the past several months due to chronic lower extremity wounds.  She reported some increased redness and drainage from the area 2 weeks ago and was placed on Bactrim.  She followed up in the office today and was found to have persistent and worsening erythema and drainage.  She was told to come to the ER for possible IV antibiotics.  Reports swelling that has gradually worsened in the past few weeks.  No chest pain, shortness of breath, fever or vomiting.  On exam patient with physical exam findings noted above.  There is significant bilateral lower extremity edema noted but she is able to move her extremities without difficulty.  She has significant erythema which is warm to touch compared to the rest of her leg on bilateral extremities.  There are some areas of  drainage noted as well.  This may have some component of chronicity as well but due to concern from wound care who follows up with her every week for worsening wounds as well as failure of outpatient antibiotics I feel that she will benefit from IV antibiotics.  She has no fever, leukocytosis or lactic acidosis.  However she is diabetic and more prone to worsening infections.  I have ordered Vanco, Zosyn as well as a low-dose of Lasix to help with diuresis.   Portions of this note were generated with Scientist, clinical (histocompatibility and immunogenetics). Dictation errors may occur despite best attempts at proofreading.  Final Clinical Impression(s) / ED Diagnoses Final diagnoses:  Peripheral edema  Bilateral lower extremity edema  Cellulitis of lower extremity, unspecified laterality    Rx / DC Orders ED Discharge Orders    None       Dietrich Pates, PA-C 03/14/21 1456    Horton, Clabe Seal, DO 03/15/21 1015

## 2021-03-14 NOTE — Progress Notes (Signed)
Pharmacy Antibiotic Note  [name] is a 75 y.o. female admitted on 03/14/2021 with cellulitis.  Pharmacy has been consulted for vancomycin dosing.  Plan: Vancomycin 1g IV x 1 given in ED, continue with 1500mg  IV q24h for estimated AUC 506 (goal 400-550), Cmin 12.2, using SCr 0.83 Follow up renal function & cultures  Height: 5\' 4"  (162.6 cm) Weight: 87.5 kg (193 lb) IBW/kg (Calculated) : 54.7  Temp (24hrs), Avg:98.2 F (36.8 C), Min:98.1 F (36.7 C), Max:98.3 F (36.8 C)  Recent Labs  Lab 03/14/21 1333  WBC 8.2  CREATININE 0.83  LATICACIDVEN 1.0    Estimated Creatinine Clearance: 63.6 mL/min (by C-G formula based on SCr of 0.83 mg/dL).    Allergies  Allergen Reactions  . Morphine Other (See Comments)    'took me out of this world' I had to be resuscitated  . Codeine Sulfate Other (See Comments)    GI upset and pain  . Demerol [Meperidine] Nausea And Vomiting  . Meperidine Hcl Nausea And Vomiting and Swelling  . Propoxyphene Hcl Other (See Comments)    Darvocet caused sick headache    Antimicrobials this admission:  4/6 Zosyn x 1 4/6 Vanc >>  Dose adjustments this admission:   Microbiology results:  4/6 BCx: 4/6 Resp panel:  Thank you for allowing pharmacy to be a part of this patient's care.  6/6, PharmD, BCPS Pharmacy: [phone] 03/14/2021 3:49 PM

## 2021-03-14 NOTE — Plan of Care (Signed)
  Problem: Pain Managment: Goal: General experience of comfort will improve Outcome: Progressing   Problem: Coping: Goal: Level of anxiety will decrease Outcome: Progressing   

## 2021-03-14 NOTE — Progress Notes (Addendum)
[name], [name] ([id number]) Visit Report for 03/14/2021 Chief Complaint Document Details Patient Name: Date of Service: [name], [name] 03/14/2021 10:15 A M Medical Record Number: [id number] Patient Account Number: [id number] Date of Birth/Sex: Treating RN: 10/18/1946 (75 y.o. Tommye Standard Primary Care Provider: Hillard Danker Other Clinician: Referring Provider: Treating Provider/Extender: Jolee Ewing in Treatment: 5 Information Obtained from: Patient Chief Complaint 02/02/2021; patient is here for review of wounds on her bilateral lower leg Electronic Signature(s) Signed: 03/14/2021 10:24:14 AM By: Lenda Kelp PA-C Entered By: Lenda Kelp on 03/14/2021 10:24:14 -------------------------------------------------------------------------------- HPI Details Patient Name: Date of Service: [name], [name] 03/14/2021 10:15 A M Medical Record Number: [id number] Patient Account Number: [id number] Date of Birth/Sex: Treating RN: 07-19-1946 (75 y.o. Tommye Standard Primary Care Provider: Hillard Danker Other Clinician: Referring Provider: Treating Provider/Extender: Jolee Ewing in Treatment: 5 History of Present Illness HPI Description: ADMISSION 02/02/2021 This is a 75 year old woman who is referred from primary care for review of substantial epithelialized on the left and right lower legs. This is almost circumferential if you take into account significant inflammation. Very painful. The patient says that this has been there for roughly 2 months started as what she thinks was a boil but has not gotten better in fact is progressed. She is a type II diabetic and is listed as having a history of chronic venous insufficiency. During it 1 recent ER visit she was listed as having lymphedema. I do not see any arterial or venous studies in her record in epic. With careful questioning it is fairly clear that the  patient is also always had "puffy legs" but she cannot really give me a timeframe perhaps 5 or 10 years. She says things were a lot worse after her TAVR Past medical history includes aortic stenosis status post TAVR on 05/17/2020. She states the swelling was up came up a lot after this. Type 2 diabetes, coronary artery disease, orthostatic hypotension and chronic venous insufficiency Far too much edema in her lower legs to do ABIs but her pedal pulses are palpable bilaterally even through this much edema. 03/07/2021 on evaluation today patient appears to be doing poorly in regard to her wounds. Both legs are extremely swollen and she even appears to have some signs of cellulitis. Fortunately there is no evidence of active infection systemically which is great news. 03/14/2021 unfortunately upon evaluation today the patient appears to be doing still significantly bad in regard to the overall pain and swelling of her lower extremities. She has been on the Bactrim which I gave her last week for 2 weeks and already she tells me she has been taking it twice a day every day but unfortunately is not doing significantly better. She has a lot of edema but more importantly and significantly I am concerned about the erythema and warmth. She did culture positive for MRSA. She should be treated appropriately with the Bactrim in that regard but unfortunately it just does not seem to be doing as well as I would like to see at this point. For that reason I am concerned she may need something more immediate such as IV antibiotic therapy and I think her best bet may be to go to the ER for further evaluation and therapy. Electronic Signature(s) Signed: 03/14/2021 11:40:12 AM By: Lenda Kelp PA-C Entered By: Lenda Kelp on 03/14/2021 11:40:12 -------------------------------------------------------------------------------- Physical Exam Details Patient Name: Date of Service: [name]  F. 03/14/2021 10:15 A  M Medical Record Number: [id number] Patient Account Number: [id number] Date of Birth/Sex: Treating RN: 1946/07/31 (75 y.o. Tommye Standard Primary Care Provider: Hillard Danker Other Clinician: Referring Provider: Treating Provider/Extender: Jolee Ewing in Treatment: 5 Constitutional Obese and well-hydrated in no acute distress. Respiratory normal breathing without difficulty. Psychiatric this patient is able to make decisions and demonstrates good insight into disease process. Alert and Oriented x 3. pleasant and cooperative. Notes Upon inspection patient's legs again showed signs of continued weeping, erythema, warmth to touch, and tenderness. Unfortunately I think that this is not significantly better compared to last week despite being on an antibiotic that should have worked for the bacterial infection that was noted on culture that I obtained last week as well. That is the Bactrim. Nonetheless I think that she may require some IV antibiotics we will get this under control and again I do want this under control I do not want her to get worse. Electronic Signature(s) Signed: 03/14/2021 11:40:47 AM By: Lenda Kelp PA-C Entered By: Lenda Kelp on 03/14/2021 11:40:46 -------------------------------------------------------------------------------- Physician Orders Details Patient Name: Date of Service: [name], [name] 03/14/2021 10:15 A M Medical Record Number: [id number] Patient Account Number: [id number] Date of Birth/Sex: Treating RN: March 06, 1946 (75 y.o. Tommye Standard Primary Care Provider: Hillard Danker Other Clinician: Referring Provider: Treating Provider/Extender: Jolee Ewing in Treatment: 5 Verbal / Phone Orders: No Diagnosis Coding ICD-10 Coding Code Description [phone] Non-pressure chronic ulcer of right calf limited to breakdown of skin L97.221 Non-pressure chronic ulcer of left calf  limited to breakdown of skin I89.0 Lymphedema, not elsewhere classified I87.323 Chronic venous hypertension (idiopathic) with inflammation of bilateral lower extremity Follow-up Appointments ppointment in 1 week. - or call to schedule once discharged from the hospital Return A Other: - Go to emergency room for treatment of bilateral lower leg cellulitis Bathing/ Shower/ Hygiene May shower with protection but do not get wound dressing(s) wet. - use cast protectors to cover wraps in the shower Edema Control - Lymphedema / SCD / Other Bilateral Lower Extremities Elevate legs to the level of the heart or above for 30 minutes daily and/or when sitting, a frequency of: - whenever sitting Avoid standing for long periods of time. Exercise regularly Wound Treatment Wound #1 - Lower Leg Wound Laterality: Right, Circumferential Secondary Dressing: Woven Gauze Sponge, Non-Sterile 4x4 in 1 x Per Week/7 Days Discharge Instructions: Apply over primary dressing as directed. Secondary Dressing: ABD Pad, 8x10 1 x Per Week/7 Days Discharge Instructions: Apply over primary dressing as directed. Secured With: [employer], 4.5x3.1 (in/yd) 1 x Per Week/7 Days Discharge Instructions: Secure with Kerlix as directed. Secured With: Transpore Surgical Tape, 2x10 (in/yd) 1 x Per Week/7 Days Discharge Instructions: Secure dressing with tape as directed. Wound #2 - Lower Leg Wound Laterality: Left, Circumferential Secondary Dressing: Woven Gauze Sponge, Non-Sterile 4x4 in 1 x Per Week/7 Days Discharge Instructions: Apply over primary dressing as directed. Secondary Dressing: ABD Pad, 8x10 1 x Per Week/7 Days Discharge Instructions: Apply over primary dressing as directed. Secured With: [employer], 4.5x3.1 (in/yd) 1 x Per Week/7 Days Discharge Instructions: Secure with Kerlix as directed. Secured With: Transpore Surgical Tape, 2x10 (in/yd) 1 x Per Week/7 Days Discharge Instructions: Secure dressing  with tape as directed. Wound #3 - Foot Wound Laterality: Dorsal, Right Secondary Dressing: Woven Gauze Sponge, Non-Sterile 4x4 in 1 x Per Week/7 Days Discharge Instructions: Apply over primary  dressing as directed. Secondary Dressing: ABD Pad, 8x10 1 x Per Week/7 Days Discharge Instructions: Apply over primary dressing as directed. Secured With: [employer], 4.5x3.1 (in/yd) 1 x Per Week/7 Days Discharge Instructions: Secure with Kerlix as directed. Secured With: Transpore Surgical Tape, 2x10 (in/yd) 1 x Per Week/7 Days Discharge Instructions: Secure dressing with tape as directed. Wound #4 - Foot Wound Laterality: Dorsal, Left Secondary Dressing: Woven Gauze Sponge, Non-Sterile 4x4 in 1 x Per Week/7 Days Discharge Instructions: Apply over primary dressing as directed. Secondary Dressing: ABD Pad, 8x10 1 x Per Week/7 Days Discharge Instructions: Apply over primary dressing as directed. Secured With: [employer], 4.5x3.1 (in/yd) 1 x Per Week/7 Days Discharge Instructions: Secure with Kerlix as directed. Secured With: Transpore Surgical Tape, 2x10 (in/yd) 1 x Per Week/7 Days Discharge Instructions: Secure dressing with tape as directed. Wound #5 - Coccyx Secondary Dressing: Woven Gauze Sponge, Non-Sterile 4x4 in 1 x Per Week/7 Days Discharge Instructions: Apply over primary dressing as directed. Secondary Dressing: ABD Pad, 8x10 1 x Per Week/7 Days Discharge Instructions: Apply over primary dressing as directed. Secured With: [employer], 4.5x3.1 (in/yd) 1 x Per Week/7 Days Discharge Instructions: Secure with Kerlix as directed. Secured With: Transpore Surgical Tape, 2x10 (in/yd) 1 x Per Week/7 Days Discharge Instructions: Secure dressing with tape as directed. Electronic Signature(s) Signed: 03/14/2021 12:46:28 PM By: Lenda Kelp PA-C Signed: 03/14/2021 5:59:57 PM By: Zenaida Deed RN, BSN Signed: 03/14/2021 5:59:57 PM By: Zenaida Deed RN, BSN Entered By:  Zenaida Deed on 03/14/2021 11:42:48 -------------------------------------------------------------------------------- Problem List Details Patient Name: Date of Service: Cynthia Hillock. 03/14/2021 10:15 A M Medical Record Number: [id number] Patient Account Number: [id number] Date of Birth/Sex: Treating RN: 12/03/1946 (74 y.o. 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 03/14/2021 10:15 A M Medical Record Number: [id number] Patient Account Number: [id number] Date of Birth/Sex: Treating RN: 28-Jun-1946 (75 y.o. Tommye Standard Primary Care Provider: Hillard Danker Other Clinician: Referring Provider: Treating Provider/Extender: Jolee Ewing in Treatment: 5 Subjective Chief Complaint Information obtained from Patient 02/02/2021; patient is here for review of wounds on her bilateral lower leg History of Present Illness (HPI) ADMISSION 02/02/2021 This is a 75 year old woman who is referred from primary care for review of substantial epithelialized on the left and  right lower legs. This is almost circumferential if you take into account significant inflammation. Very painful. The patient says that this has been there for roughly 2 months started as what she thinks was a boil but has not gotten better in fact is progressed. She is a type II diabetic and is listed as having a history of chronic venous insufficiency. During it 1 recent ER visit she was listed as having lymphedema. I do not see any arterial or venous studies in her record in epic. With careful  questioning it is fairly clear that the patient is also always had "puffy legs" but she cannot really give me a timeframe perhaps 5 or 10 years. She says things were a lot worse after her TAVR Past medical history includes aortic stenosis status post TAVR on 05/17/2020. She states the swelling was up came up a lot after this. Type 2 diabetes, coronary artery disease, orthostatic hypotension and chronic venous insufficiency Far too much edema in her lower legs to do ABIs but her pedal pulses are palpable bilaterally even through this much edema. 03/07/2021 on evaluation today patient appears to be doing poorly in regard to her wounds. Both legs are extremely swollen and she even appears to have some signs of cellulitis. Fortunately there is no evidence of active infection systemically which is great news. 03/14/2021 unfortunately upon evaluation today the patient appears to be doing still significantly bad in regard to the overall pain and swelling of her lower extremities. She has been on the Bactrim which I gave her last week for 2 weeks and already she tells me she has been taking it twice a day every day but unfortunately is not doing significantly better. She has a lot of edema but more importantly and significantly I am concerned about the erythema and warmth. She did culture positive for MRSA. She should be treated appropriately with the Bactrim in that regard but unfortunately it just does not seem to be  doing as well as I would like to see at this point. For that reason I am concerned she may need something more immediate such as IV antibiotic therapy and I think her best bet may be to go to the ER for further evaluation and therapy. Objective Constitutional Obese and well-hydrated in no acute distress. Vitals Time Taken: 10:45 AM, Height: 64 in, Weight: 200 lbs, BMI: 34.3, Temperature: 98.3 F, Pulse: 86 bpm, Respiratory Rate: 20 breaths/min, Blood Pressure: 160/75 mmHg. Respiratory normal breathing without difficulty. Psychiatric this patient is able to make decisions and demonstrates good insight into disease process. Alert and Oriented x 3. pleasant and cooperative. General Notes: Upon inspection patient's legs again showed signs of continued weeping, erythema, warmth to touch, and tenderness. Unfortunately I think that this is not significantly better compared to last week despite being on an antibiotic that should have worked for the bacterial infection that was noted on culture that I obtained last week as well. That is the Bactrim. Nonetheless I think that she may require some IV antibiotics we will get this under control and again I do want this under control I do not want her to get worse. Integumentary (Hair, Skin) Wound #1 status is Open. Original cause of wound was Gradually Appeared. The date acquired was: 12/09/2020. The wound has been in treatment 5 weeks. The wound is located on the Right,Circumferential Lower Leg. The wound measures 12.5cm length x 23cm width x 0.1cm depth; 225.802cm^2 area and 22.58cm^3 volume. There is Fat Layer (Subcutaneous Tissue) exposed. There is no tunneling or undermining noted. There is a large amount of purulent drainage noted. Foul odor after cleansing was noted. The wound margin is flat and intact. There is large (67-100%) red granulation within the wound bed. There is a small (1-33%) amount of necrotic tissue within the wound bed including  Adherent Slough. Wound #2 status is Open. Original cause of wound was Gradually Appeared. The date acquired was: 12/09/2020. The wound has been in treatment 5 weeks. The wound is located on the Left,Circumferential Lower Leg.  The wound measures 11cm length x 21cm width x 0.1cm depth; 181.427cm^2 area and 18.143cm^3 volume. There is Fat Layer (Subcutaneous Tissue) exposed. There is no tunneling or undermining noted. There is a large amount of purulent drainage noted. Foul odor after cleansing was noted. The wound margin is flat and intact. There is medium (34-66%) red, pink granulation within the wound bed. There is a medium (34-66%) amount of necrotic tissue within the wound bed including Adherent Slough. Wound #3 status is Open. Original cause of wound was Gradually Appeared. The date acquired was: 03/14/2021. The wound is located on the Right,Dorsal Foot. The wound measures 1.5cm length x 2cm width x 0.1cm depth; 2.356cm^2 area and 0.236cm^3 volume. There is Fat Layer (Subcutaneous Tissue) exposed. There is no tunneling or undermining noted. There is a large amount of serosanguineous drainage noted. The wound margin is distinct with the outline attached to the wound base. There is medium (34-66%) red, pink granulation within the wound bed. There is a medium (34-66%) amount of necrotic tissue within the wound bed including Adherent Slough. Wound #4 status is Open. Original cause of wound was Gradually Appeared. The date acquired was: 03/14/2021. The wound is located on the Left,Dorsal Foot. The wound measures 3.8cm length x 2cm width x 0.1cm depth; 5.969cm^2 area and 0.597cm^3 volume. There is Fat Layer (Subcutaneous Tissue) exposed. There is no tunneling or undermining noted. There is a large amount of purulent drainage noted. The wound margin is distinct with the outline attached to the wound base. There is small (1-33%) red granulation within the wound bed. There is a large (67-100%) amount of necrotic  tissue within the wound bed including Adherent Slough. Wound #5 status is Open. Original cause of wound was Pressure Injury. The date acquired was: 02/28/2021. The wound is located on the Coccyx. The wound measures 1.2cm length x 0.4cm width x 0.2cm depth; 0.377cm^2 area and 0.075cm^3 volume. There is Fat Layer (Subcutaneous Tissue) exposed. There is no tunneling or undermining noted. There is a medium amount of serosanguineous drainage noted. The wound margin is distinct with the outline attached to the wound base. There is large (67-100%) red granulation within the wound bed. There is no necrotic tissue within the wound bed. 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 There was a Three Layer Compression Therapy Procedure by Fonnie MuBreedlove, Lauren, RN. Post procedure Diagnosis Wound #1: Same as Pre-Procedure Wound #2 Pre-procedure diagnosis of Wound #2 is a Lymphedema located on the Left,Circumferential Lower Leg . There was a Three Layer Compression Therapy Procedure by Fonnie MuBreedlove, Lauren, RN. Post procedure Diagnosis Wound #2: Same as Pre-Procedure Plan Follow-up Appointments: Return Appointment in 1 week. - or call to schedule once discharged from the hospital Other: - Go to emergency room for treatment of bilateral lower leg cellulitis Bathing/ Shower/ Hygiene: May shower with protection but do not get wound dressing(s) wet. - use cast protectors to cover wraps in the shower Edema Control - Lymphedema / SCD / Other: Elevate legs to the level of the heart or above for 30 minutes daily and/or when sitting, a frequency of: -  whenever sitting Avoid standing for long periods of time. Exercise regularly WOUND #1: - Lower Leg Wound Laterality:  Right, Circumferential Secondary Dressing: Woven Gauze Sponge, Non-Sterile 4x4 in 1 x Per Week/7 Days Discharge Instructions: Apply over primary dressing as directed. Secondary Dressing: ABD Pad, 8x10 1 x Per Week/7 Days Discharge Instructions: Apply over primary dressing as directed. Secured With: [employer], 4.5x3.1 (in/yd) 1 x Per Week/7 Days Discharge Instructions: Secure with Kerlix as directed. Secured With: Transpore Surgical T ape, 2x10 (in/yd) 1 x Per Week/7 Days Discharge Instructions: Secure dressing with tape as directed. WOUND #2: - Lower Leg Wound Laterality: Left, Circumferential Secondary Dressing: Woven Gauze Sponge, Non-Sterile 4x4 in 1 x Per Week/7 Days Discharge Instructions: Apply over primary dressing as directed. Secondary Dressing: ABD Pad, 8x10 1 x Per Week/7 Days Discharge Instructions: Apply over primary dressing as directed. Secured With: [employer], 4.5x3.1 (in/yd) 1 x Per Week/7 Days Discharge Instructions: Secure with Kerlix as directed. Secured With: Transpore Surgical T ape, 2x10 (in/yd) 1 x Per Week/7 Days Discharge Instructions: Secure dressing with tape as directed. WOUND #3: - Foot Wound Laterality: Dorsal, Right Secondary Dressing: Woven Gauze Sponge, Non-Sterile 4x4 in 1 x Per Week/7 Days Discharge Instructions: Apply over primary dressing as directed. Secondary Dressing: ABD Pad, 8x10 1 x Per Week/7 Days Discharge Instructions: Apply over primary dressing as directed. Secured With: [employer], 4.5x3.1 (in/yd) 1 x Per Week/7 Days Discharge Instructions: Secure with Kerlix as directed. Secured With: Transpore Surgical T ape, 2x10 (in/yd) 1 x Per Week/7 Days Discharge Instructions: Secure dressing with tape as directed. WOUND #4: - Foot Wound Laterality: Dorsal, Left Secondary Dressing: Woven Gauze Sponge,  Non-Sterile 4x4 in 1 x Per Week/7 Days Discharge Instructions: Apply over primary dressing as directed. Secondary Dressing: ABD Pad, 8x10 1 x Per Week/7 Days Discharge Instructions: Apply over primary dressing as directed. Secured With: [employer], 4.5x3.1 (in/yd) 1 x Per Week/7 Days Discharge Instructions: Secure with Kerlix as directed. Secured With: Transpore Surgical T ape, 2x10 (in/yd) 1 x Per Week/7 Days Discharge Instructions: Secure dressing with tape as directed. WOUND #5: - Coccyx Wound Laterality: Secondary Dressing: Woven Gauze Sponge, Non-Sterile 4x4 in 1 x Per Week/7 Days Discharge Instructions: Apply over primary dressing as directed. Secondary Dressing: ABD Pad, 8x10 1 x Per Week/7 Days Discharge Instructions: Apply over primary dressing as directed. Secured With: [employer], 4.5x3.1 (in/yd) 1 x Per Week/7 Days Discharge Instructions: Secure with Kerlix as directed. Secured With: Transpore Surgical T ape, 2x10 (in/yd) 1 x Per Week/7 Days Discharge Instructions: Secure dressing with tape as directed. 1. At this point I would recommend that she go to the ER for further evaluation and treatment. I think this is good to be the best option for her considering the MRSA infection which is not really responding to the Bactrim despite the fact that it really should be. 2. I am also can recommend that we put on temporary dressings to get her to the ER for further evaluation and treatment. I do not think that we need to put on the standard wraps as we have previous. We will see her following the ER evaluation to see where things stand. Electronic Signature(s) Signed: 03/14/2021 11:48:00 AM By: Lenda Kelp PA-C Previous Signature: 03/14/2021 11:42:30 AM Version By: Lenda Kelp PA-C Entered By: Lenda Kelp on 03/14/2021 11:48:00 -------------------------------------------------------------------------------- SuperBill Details Patient Name: Date of  Service: [name], [name] 03/14/2021 Medical Record Number: [id number] Patient Account Number: [id number] Date of Birth/Sex: Treating RN: [birth year] (75 y.o. 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 I89.0 Lymphedema, not elsewhere classified I87.323 Chronic venous hypertension (idiopathic) with inflammation of bilateral lower extremity Facility Procedures CPT4 Code: [id number] Description: [phone] - WOUND CARE VISIT-LEV 5 EST PT Modifier: Quantity: 1 Physician Procedures : CPT4 Code Description Modifier [id number] - WC PHYS LEVEL 4 - EST PT ICD-10 Diagnosis Description L97.211 Non-pressure chronic ulcer of right calf limited to breakdown of skin L97.221 Non-pressure chronic ulcer of left calf limited to breakdown of  skin I89.0 Lymphedema, not elsewhere classified I87.323 Chronic venous hypertension (idiopathic) with inflammation of bilateral lower extremity Quantity: 1 Electronic Signature(s) Signed: 03/14/2021 12:46:28 PM By: Lenda Kelp PA-C Signed: 03/14/2021 5:59:57 PM By: Zenaida Deed RN, BSN Previous Signature: 03/14/2021 11:48:11 AM Version By: Lenda Kelp PA-C Entered By: Zenaida Deed on 03/14/2021 12:03:54

## 2021-03-15 DIAGNOSIS — Z9114 Patient's other noncompliance with medication regimen: Secondary | ICD-10-CM | POA: Diagnosis not present

## 2021-03-15 DIAGNOSIS — Z7984 Long term (current) use of oral hypoglycemic drugs: Secondary | ICD-10-CM | POA: Diagnosis not present

## 2021-03-15 DIAGNOSIS — I110 Hypertensive heart disease with heart failure: Secondary | ICD-10-CM | POA: Diagnosis present

## 2021-03-15 DIAGNOSIS — L03116 Cellulitis of left lower limb: Secondary | ICD-10-CM | POA: Diagnosis present

## 2021-03-15 DIAGNOSIS — Z833 Family history of diabetes mellitus: Secondary | ICD-10-CM | POA: Diagnosis not present

## 2021-03-15 DIAGNOSIS — Z952 Presence of prosthetic heart valve: Secondary | ICD-10-CM | POA: Diagnosis not present

## 2021-03-15 DIAGNOSIS — I5033 Acute on chronic diastolic (congestive) heart failure: Secondary | ICD-10-CM | POA: Diagnosis present

## 2021-03-15 DIAGNOSIS — E1151 Type 2 diabetes mellitus with diabetic peripheral angiopathy without gangrene: Secondary | ICD-10-CM | POA: Diagnosis present

## 2021-03-15 DIAGNOSIS — I872 Venous insufficiency (chronic) (peripheral): Secondary | ICD-10-CM | POA: Diagnosis present

## 2021-03-15 DIAGNOSIS — E785 Hyperlipidemia, unspecified: Secondary | ICD-10-CM | POA: Diagnosis present

## 2021-03-15 DIAGNOSIS — Z7982 Long term (current) use of aspirin: Secondary | ICD-10-CM | POA: Diagnosis not present

## 2021-03-15 DIAGNOSIS — E11628 Type 2 diabetes mellitus with other skin complications: Secondary | ICD-10-CM | POA: Diagnosis not present

## 2021-03-15 DIAGNOSIS — E1142 Type 2 diabetes mellitus with diabetic polyneuropathy: Secondary | ICD-10-CM | POA: Diagnosis present

## 2021-03-15 DIAGNOSIS — Z90710 Acquired absence of both cervix and uterus: Secondary | ICD-10-CM | POA: Diagnosis not present

## 2021-03-15 DIAGNOSIS — R609 Edema, unspecified: Secondary | ICD-10-CM | POA: Diagnosis present

## 2021-03-15 DIAGNOSIS — R600 Localized edema: Secondary | ICD-10-CM | POA: Diagnosis not present

## 2021-03-15 DIAGNOSIS — I2510 Atherosclerotic heart disease of native coronary artery without angina pectoris: Secondary | ICD-10-CM | POA: Diagnosis present

## 2021-03-15 DIAGNOSIS — E1165 Type 2 diabetes mellitus with hyperglycemia: Secondary | ICD-10-CM | POA: Diagnosis present

## 2021-03-15 DIAGNOSIS — Z9049 Acquired absence of other specified parts of digestive tract: Secondary | ICD-10-CM | POA: Diagnosis not present

## 2021-03-15 DIAGNOSIS — E1169 Type 2 diabetes mellitus with other specified complication: Secondary | ICD-10-CM | POA: Diagnosis present

## 2021-03-15 DIAGNOSIS — Z20822 Contact with and (suspected) exposure to covid-19: Secondary | ICD-10-CM | POA: Diagnosis present

## 2021-03-15 DIAGNOSIS — Z794 Long term (current) use of insulin: Secondary | ICD-10-CM | POA: Diagnosis not present

## 2021-03-15 DIAGNOSIS — L03119 Cellulitis of unspecified part of limb: Secondary | ICD-10-CM | POA: Diagnosis not present

## 2021-03-15 DIAGNOSIS — Z951 Presence of aortocoronary bypass graft: Secondary | ICD-10-CM | POA: Diagnosis not present

## 2021-03-15 DIAGNOSIS — L03115 Cellulitis of right lower limb: Secondary | ICD-10-CM | POA: Diagnosis present

## 2021-03-15 DIAGNOSIS — L89152 Pressure ulcer of sacral region, stage 2: Secondary | ICD-10-CM | POA: Diagnosis present

## 2021-03-15 DIAGNOSIS — I252 Old myocardial infarction: Secondary | ICD-10-CM | POA: Diagnosis not present

## 2021-03-15 DIAGNOSIS — I10 Essential (primary) hypertension: Secondary | ICD-10-CM | POA: Diagnosis not present

## 2021-03-15 DIAGNOSIS — Z79899 Other long term (current) drug therapy: Secondary | ICD-10-CM | POA: Diagnosis not present

## 2021-03-15 LAB — CBC
HCT: 39.2 % (ref 36.0–46.0)
Hemoglobin: 12.7 g/dL (ref 12.0–15.0)
MCH: 27.3 pg (ref 26.0–34.0)
MCHC: 32.4 g/dL (ref 30.0–36.0)
MCV: 84.1 fL (ref 80.0–100.0)
Platelets: 201 10*3/uL (ref 150–400)
RBC: 4.66 MIL/uL (ref 3.87–5.11)
RDW: 12.7 % (ref 11.5–15.5)
WBC: 9.9 10*3/uL (ref 4.0–10.5)
nRBC: 0 % (ref 0.0–0.2)

## 2021-03-15 LAB — GLUCOSE, CAPILLARY
Glucose-Capillary: 162 mg/dL — ABNORMAL HIGH (ref 70–99)
Glucose-Capillary: 199 mg/dL — ABNORMAL HIGH (ref 70–99)
Glucose-Capillary: 127 mg/dL — ABNORMAL HIGH (ref 70–99)
Glucose-Capillary: 186 mg/dL — ABNORMAL HIGH (ref 70–99)

## 2021-03-15 LAB — BASIC METABOLIC PANEL
Anion gap: 8 (ref 5–15)
BUN: 15 mg/dL (ref 8–23)
CO2: 32 mmol/L (ref 22–32)
Calcium: 8.7 mg/dL — ABNORMAL LOW (ref 8.9–10.3)
Chloride: 95 mmol/L — ABNORMAL LOW (ref 98–111)
Creatinine, Ser: 0.75 mg/dL (ref 0.44–1.00)
GFR, Estimated: >60 mL/min (ref >60)
Glucose, Bld: 196 mg/dL — ABNORMAL HIGH (ref 70–99)
Potassium: 4.1 mmol/L (ref 3.5–5.1)
Sodium: 135 mmol/L (ref 135–145)

## 2021-03-15 MED ORDER — SODIUM CHLORIDE 0.9 % IV SOLN
1.0000 g | Freq: Every day | INTRAVENOUS | Status: DC
  Administered 2021-03-15 – 2021-03-16 (×2): 1 g via INTRAVENOUS
  Filled 2021-03-15 (×3): qty 10

## 2021-03-15 MED ORDER — INSULIN GLARGINE 100 UNIT/ML ~~LOC~~ SOLN
15.0000 [IU] | Freq: Two times a day (BID) | SUBCUTANEOUS | Status: DC
  Administered 2021-03-15 (×2): 15 [IU] via SUBCUTANEOUS
  Filled 2021-03-15 (×3): qty 0.15

## 2021-03-15 NOTE — Progress Notes (Signed)
Inpatient Diabetes Program Recommendations  AACE/ADA: New Consensus Statement on Inpatient Glycemic Control (2015)  Target Ranges:  Prepandial:   less than 140 mg/dL      Peak postprandial:   less than 180 mg/dL (1-2 hours)      Critically ill patients:  140 - 180 mg/dL   Lab Results  Component Value Date   GLUCAP 162 (H) 03/15/2021   HGBA1C 12.6 (H) 03/14/2021    Review of Glycemic Control  Diabetes history: DM2 Outpatient Diabetes medications: NPH 70/30 45 units BID with meal, metformin 500 mg BID, Rybelsus 3 mg QD Current orders for Inpatient glycemic control: Lantus 15 units BID, Novolog 0-20 units TID with meals and 0-5 HS + 4 units TID with meals  HgbA1C - 12.6% - poor glycemic control Not being compliant with her insulin, per MD - ? Cognitive impairment. Needs tight control for healing leg wounds  Inpatient Diabetes Program Recommendations:     Increase Novolog to 6 units TID with meals if eating > 50% meal  Follow closely.  Thank you. Ailene Ards, RD, LDN, CDE Inpatient Diabetes Coordinator [phone]

## 2021-03-15 NOTE — Consult Note (Signed)
WOC Nurse Consult Note: Reason for Consult: Consult requested for bilat legs.  Pt has generalized edema, erythemia, and scattered areas of partial thickness skin loss and mod amt yellow drainage weeping.  Wounds are yellow and moist Dressing procedure/placement/frequency: Topical treatment orders provided for bedside nurses to perform as follows to absorb drainage, promote drying and healing, and reduce edema: Bedside nurse; please change dressings to bilat legs Q Tues/Thurs/Sat as follows: apply xeroform and abd pads to draining areas, then cover with kerlex, beginning just behind toes to below knees in a spiral fashion, then ace wrap in the same manner. Please re-consult if further assistance is needed.  Thank-you,  Cammie Mcgee MSN, RN, CWOCN, [place], CNS [phone]

## 2021-03-15 NOTE — Progress Notes (Signed)
[facility] PROGRESS NOTE    Progress Note  [name]  RUE:[id number] DOB: 13-May-1946 DOA: 03/14/2021 PCP: Myrlene Broker, MD     Brief Narrative:   [name] is an 75 y.o. female past medical history of diabetes mellitus type 2, essential hypertension, TVRS, coronary artery disease with an NSTEMI with a cath on May 2021 which showed nonobstructive disease, with chronic diastolic heart failure by echo with an EF of 60%, chronic venous insufficiency with chronic wound with ongoing on Bactrim bilateral lower extremity pain.  She comes in as her pain got worst weeping and swelling over the last week, she saw her PCP who started her on Bactrim and increase her p.o. Lasix was referred to the ED by the wound care specialist as there was a concern of cellulitis.  Patient denies any fever.  Antibiotics: None  Microbiology data: Blood culture:  Procedures: None  Assessment/Plan:   Bilateral lower extremity cellulitis, purulent with poorly controlled diabetes mellitus: She failed 1 week of Bactrim as an outpatient, she does have a history of MRSA. Started empirically on IV vancomycin on admission. Has remained afebrile leukocytosis trending up continue monitor fever curve. There is a remain negative till date. Consult physical therapy  Bilateral lower extremity edema: Poor urine output despite increasing Lasix. Continue IV Lasix try to keep her legs elevated above heart level.  Poorly controlled diabetes mellitus with peripheral vascular disease: Holding oral hypoglycemic agents, change her long-acting insulin to twice daily continue sliding scale insulin. Her last A1c was 12.  She is definitely not being compliant with her insulin, she relates she has not seen anyone about a week, question if some kind of cognitive impairment.  Chronic bilateral lower extremity wound with chronic venous insufficiency: Wound care has been consulted.  Essential  hypertension: Continue metoprolol.  Hyperlipidemia associated with type 2 diabetes mellitus (HCC) Continue statin Statins..  Status post CABG: Continue aspirin.   Stage II sacral decubitus ulcer present on admission:  RN Pressure Injury Documentation: Pressure Injury 05/11/20 Coccyx Medial Stage 2 -  Partial thickness loss of dermis presenting as a shallow open injury with a red, pink wound bed without slough. Shallow open area between buttocks (Active)  05/11/20 1500  Location: Coccyx  Location Orientation: Medial  Staging: Stage 2 -  Partial thickness loss of dermis presenting as a shallow open injury with a red, pink wound bed without slough.  Wound Description (Comments): Shallow open area between buttocks  Present on Admission: Yes    Estimated body mass index is 33.13 kg/m as calculated from the following:   Height as of this encounter: 5\' 4"  (1.626 m).   Weight as of this encounter: 87.5 kg.    DVT prophylaxis: lovenox Family Communication:none Status is: Observation  The patient will require care spanning > 2 midnights and should be moved to inpatient because: Hemodynamically unstable  Dispo: The patient is from: Home              Anticipated d/c is to: Home              Patient currently is not medically stable to d/c.   Difficult to place patient No        Code Status:     Code Status Orders  (From admission, onward)         Start     Ordered   03/14/21 1523  Full code  Continuous        03/14/21 1522  Code Status History    Date Active Date Inactive Code Status Order ID Comments User Context   05/11/2020 1051 05/19/2020 2033 Full Code [id number]  Clydie BraunSmith, Rondell A, MD ED   05/05/2020 2113 05/10/2020 1517 Full Code [id number]  Corrin ParkerGoodrich, Callie E, PA-C Inpatient   12/19/2014 1319 12/22/2014 1727 Full Code [id number]  Albertine GratesXu, Fang, MD Inpatient   03/26/2014 1156 03/31/2014 1444 Full Code [id number]  Genelle GatherGlenn, Kathryn F, MD Inpatient   Advance Care Planning  Activity        IV Access:    Peripheral IV   Procedures and diagnostic studies:   No results found.   Medical Consultants:    None.   Subjective:    [name] she does not give me an answer when I ask her if she has been taking insulin or oral hypoglycemic agents.  Objective:    Vitals:   03/14/21 1445 03/14/21 2132 03/15/21 0104 03/15/21 0501  BP: 137/78 (!) 126/58 (!) 112/59 (!) 112/51  Pulse: 83 84 75 78  Resp: 19 16 18 16   Temp:  98.5 F (36.9 C) 98.9 F (37.2 C) 99.5 F (37.5 C)  TempSrc:  Oral Oral Oral  SpO2: 94% 93% 91% 94%  Weight:      Height:       SpO2: 94 %   Intake/Output Summary (Last 24 hours) at 03/15/2021 0713 Last data filed at 03/15/2021 0500 Gross per 24 hour  Intake 743.21 ml  Output 2000 ml  Net -1256.79 ml   Filed Weights   03/14/21 1352  Weight: 87.5 kg    Exam: General exam: In no acute distress. Respiratory system: Good air movement and clear to auscultation. Cardiovascular system: S1 & S2 heard, RRR. No JVD. Gastrointestinal system: Abdomen is nondistended, soft and nontender.  Extremities: No pedal edema. Skin: Beefy red bilateral lower extremity legs, edema with warm to touch tender to palpation with a superficial purulent drainage. Psychiatry: Judgement and insight appear normal. Mood & affect appropriate.    Data Reviewed:    Labs: Basic Metabolic Panel: Recent Labs  Lab 03/14/21 1333 03/15/21 0305  NA 138 135  K 4.3 4.1  CL 100 95*  CO2 30 32  GLUCOSE 330* 196*  BUN 15 15  CREATININE 0.83 0.75  CALCIUM 9.5 8.7*   GFR Estimated Creatinine Clearance: 66 mL/min (by C-G formula based on SCr of 0.75 mg/dL). Liver Function Tests: Recent Labs  Lab 03/14/21 1333  AST 16  ALT 20  ALKPHOS 180*  BILITOT 0.3  PROT 8.4*  ALBUMIN 3.8   No results for input(s): LIPASE, AMYLASE in the last 168 hours. No results for input(s): AMMONIA in the last 168 hours. Coagulation profile No results  for input(s): INR, PROTIME in the last 168 hours. COVID-19 Labs  No results for input(s): DDIMER, FERRITIN, LDH, CRP in the last 72 hours.  Lab Results  Component Value Date   SARSCOV2NAA NEGATIVE 03/14/2021   SARSCOV2NAA NEGATIVE 05/11/2020   SARSCOV2NAA NEGATIVE 05/05/2020    CBC: Recent Labs  Lab 03/14/21 1333 03/15/21 0305  WBC 8.2 9.9  NEUTROABS 5.6  --   HGB 14.6 12.7  HCT 45.9 39.2  MCV 84.2 84.1  PLT 231 201   Cardiac Enzymes: No results for input(s): CKTOTAL, CKMB, CKMBINDEX, TROPONINI in the last 168 hours. BNP (last 3 results) Recent Labs    06/15/20 1528 02/22/21 1536  PROBNP 308* 55.0   CBG: Recent Labs  Lab 03/14/21 1352 03/14/21 1649 03/14/21 2130  GLUCAP  308* 316* 263*   D-Dimer: No results for input(s): DDIMER in the last 72 hours. Hgb A1c: Recent Labs    03/14/21 1706  HGBA1C 12.6*   Lipid Profile: No results for input(s): CHOL, HDL, LDLCALC, TRIG, CHOLHDL, LDLDIRECT in the last 72 hours. Thyroid function studies: No results for input(s): TSH, T4TOTAL, T3FREE, THYROIDAB in the last 72 hours.  Invalid input(s): FREET3 Anemia work up: No results for input(s): VITAMINB12, FOLATE, FERRITIN, TIBC, IRON, RETICCTPCT in the last 72 hours. Sepsis Labs: Recent Labs  Lab 03/14/21 1333 03/14/21 1706 03/15/21 0305  WBC 8.2  --  9.9  LATICACIDVEN 1.0 1.1  --    Microbiology Recent Results (from the past 240 hour(s))  Aerobic Culture w Gram Stain (superficial specimen)     Status: None   Collection Time: 03/07/21 12:25 PM   Specimen: Leg  Result Value Ref Range Status   Specimen Description   Final    LEG RIGHT LOWER Performed at [facility], [address] [place], [place] [address]    Special Requests   Final    NONE Performed at [facility], [address], [place], [place] [address]    Gram Stain   Final    NO WBC SEEN ABUNDANT GRAM POSITIVE COCCI Performed at [facility],  [address], [place], [place] [address]    Culture   Final    ABUNDANT METHICILLIN RESISTANT STAPHYLOCOCCUS AUREUS   Report Status 03/11/2021 FINAL  Final   Organism ID, Bacteria METHICILLIN RESISTANT STAPHYLOCOCCUS AUREUS  Final      Susceptibility   Methicillin resistant staphylococcus aureus - MIC*    CIPROFLOXACIN >=8 RESISTANT Resistant     ERYTHROMYCIN <=0.25 SENSITIVE Sensitive     GENTAMICIN <=0.5 SENSITIVE Sensitive     OXACILLIN >=4 RESISTANT Resistant     TETRACYCLINE <=1 SENSITIVE Sensitive     VANCOMYCIN 1 SENSITIVE Sensitive     TRIMETH/SULFA <=10 SENSITIVE Sensitive     CLINDAMYCIN <=0.25 SENSITIVE Sensitive     RIFAMPIN <=0.5 SENSITIVE Sensitive     Inducible Clindamycin NEGATIVE Sensitive     * ABUNDANT METHICILLIN RESISTANT STAPHYLOCOCCUS AUREUS  Culture, blood (routine x 2)     Status: None (Preliminary result)   Collection Time: 03/14/21  1:33 PM   Specimen: BLOOD  Result Value Ref Range Status   Specimen Description   Final    BLOOD LEFT ANTECUBITAL Performed at [facility], [address], [place], [place] [address]    Special Requests   Final    BOTTLES DRAWN AEROBIC AND ANAEROBIC Blood Culture results may not be optimal due to an inadequate volume of blood received in culture bottles Performed at [facility], [address] [place], [place] [address]    Culture   Final    NO GROWTH < 12 HOURS Performed at [facility], [address] [place], [place] [address]    Report Status PENDING  Incomplete  Culture, blood (routine x 2)     Status: None (Preliminary result)   Collection Time: 03/14/21  1:33 PM   Specimen: BLOOD  Result Value Ref Range Status   Specimen Description   Final    BLOOD RIGHT ANTECUBITAL Performed at [facility], [address], [place], [place] [address]    Special Requests   Final    BOTTLES DRAWN AEROBIC AND ANAEROBIC Blood Culture results may not be optimal due  to an inadequate volume  of blood received in culture bottles Performed at [facility], [address], [place], [place] [address]    Culture   Final    NO GROWTH < 12 HOURS Performed at [facility], [address] [place], [place] [address]    Report Status PENDING  Incomplete  Resp Panel by RT-PCR (Flu A&B, Covid) Nasopharyngeal Swab     Status: None   Collection Time: 03/14/21  2:26 PM   Specimen: Nasopharyngeal Swab; Nasopharyngeal(NP) swabs in vial transport medium  Result Value Ref Range Status   SARS Coronavirus 2 by RT PCR NEGATIVE NEGATIVE Final    Comment: (NOTE) SARS-CoV-2 target nucleic acids are NOT DETECTED.  The SARS-CoV-2 RNA is generally detectable in upper respiratory specimens during the acute phase of infection. The lowest concentration of SARS-CoV-2 viral copies this assay can detect is 138 copies/mL. A negative result does not preclude SARS-Cov-2 infection and should not be used as the sole basis for treatment or other patient management decisions. A negative result may occur with  improper specimen collection/handling, submission of specimen other than nasopharyngeal swab, presence of viral mutation(s) within the areas targeted by this assay, and inadequate number of viral copies(<138 copies/mL). A negative result must be combined with clinical observations, patient history, and epidemiological information. The expected result is Negative.  Fact Sheet for Patients:  BloggerCourse.com  Fact Sheet for Healthcare Providers:  SeriousBroker.it  This test is no t yet approved or cleared by the [place] FDA and  has been authorized for detection and/or diagnosis of SARS-CoV-2 by FDA under an Emergency Use Authorization (EUA). This EUA will remain  in effect (meaning this test can be used) for the duration of the COVID-19 declaration under Section 564(b)(1) of the Act,  21 U.S.C.section 360bbb-3(b)(1), unless the authorization is terminated  or revoked sooner.       Influenza A by PCR NEGATIVE NEGATIVE Final   Influenza B by PCR NEGATIVE NEGATIVE Final    Comment: (NOTE) The Xpert Xpress SARS-CoV-2/FLU/RSV plus assay is intended as an aid in the diagnosis of influenza from Nasopharyngeal swab specimens and should not be used as a sole basis for treatment. Nasal washings and aspirates are unacceptable for Xpert Xpress SARS-CoV-2/FLU/RSV testing.  Fact Sheet for Patients: BloggerCourse.com  Fact Sheet for Healthcare Providers: SeriousBroker.it  This test is not yet approved or cleared by the [place] FDA and has been authorized for detection and/or diagnosis of SARS-CoV-2 by FDA under an Emergency Use Authorization (EUA). This EUA will remain in effect (meaning this test can be used) for the duration of the COVID-19 declaration under Section 564(b)(1) of the Act, 21 U.S.C. section 360bbb-3(b)(1), unless the authorization is terminated or revoked.  Performed at [facility], [address] [place], [place] [address]      Medications:   . aspirin EC  81 mg Oral Daily  . atorvastatin  80 mg Oral Daily  . citalopram  20 mg Oral Daily  . enoxaparin (LOVENOX) injection  40 mg Subcutaneous Q24H  . ezetimibe  10 mg Oral Daily  . furosemide  40 mg Intravenous BID  . gabapentin  300 mg Oral TID  . insulin aspart  0-20 Units Subcutaneous TID WC  . insulin aspart  0-5 Units Subcutaneous QHS  . insulin aspart  4 Units Subcutaneous TID WC  . insulin glargine  20 Units Subcutaneous Daily  . metoprolol tartrate  12.5 mg Oral BID  . pantoprazole  40 mg Oral  Daily   Continuous Infusions: . sodium chloride 10 mL/hr at 03/14/21 1838  . vancomycin        LOS: 0 days   Marinda Elk  [facility]  03/15/2021, 7:13 AM

## 2021-03-15 NOTE — Progress Notes (Addendum)
Cynthia, [name] ([id number]) Visit Report for 03/14/2021 Arrival Information Details Patient Name: Date of Service: [name], [name] 03/14/2021 10:15 A M Medical Record Number: [id number] Patient Account Number: [id number] Date of Birth/Sex: Treating RN: [birth year] (75 y.o. [name], [name] Primary Care Srah Ake: Hillard Danker Other Clinician: Referring Rayvn Rickerson: Treating Tannar Broker/Extender: Jolee Ewing in Treatment: 5 Visit Information History Since Last Visit Added or deleted any medications: No Patient Arrived: [name] Any new allergies or adverse reactions: No Arrival Time: 10:44 Had a fall or experienced change in No Accompanied By: self activities of daily living that may affect Transfer Assistance: None risk of falls: Patient Identification Verified: Yes Signs or symptoms of abuse/neglect since last visito No Secondary Verification Process Completed: Yes Hospitalized since last visit: No Patient Requires Transmission-Based No Implantable device outside of the clinic excluding No Precautions: cellular tissue based products placed in the center Patient Has Alerts: Yes since last visit: Patient Alerts: ABI's: Non Compressible Has Dressing in Place as Prescribed: Yes Has Compression in Place as Prescribed: Yes Pain Present Now: Yes Electronic Signature(s) Signed: 03/14/2021 5:48:55 PM By: Shawn Stall Entered By: Shawn Stall on 03/14/2021 10:59:41 -------------------------------------------------------------------------------- Clinic Level of Care Assessment Details Patient Name: Date of Service: [name] 03/14/2021 10:15 A M Medical Record Number: [id number] Patient Account Number: [id number] Date of Birth/Sex: Treating RN: [birth year] (75 y.o. [name] Primary Care Sherby Moncayo: Hillard Danker Other Clinician: Referring Yadira Hada: Treating Destinie Thornsberry/Extender: Jolee Ewing in Treatment:  5 Clinic Level of Care Assessment Items TOOL 4 Quantity Score []  - 0 Use when only an EandM is performed on FOLLOW-UP visit ASSESSMENTS - Nursing Assessment / Reassessment X- 1 10 Reassessment of Co-morbidities (includes updates in patient status) X- 1 5 Reassessment of Adherence to Treatment Plan ASSESSMENTS - Wound and Skin A ssessment / Reassessment []  - 0 Simple Wound Assessment / Reassessment - one wound X- 5 5 Complex Wound Assessment / Reassessment - multiple wounds []  - 0 Dermatologic / Skin Assessment (not related to wound area) ASSESSMENTS - Focused Assessment X- 2 5 Circumferential Edema Measurements - multi extremities []  - 0 Nutritional Assessment / Counseling / Intervention X- 1 5 Lower Extremity Assessment (monofilament, tuning fork, pulses) []  - 0 Peripheral Arterial Disease Assessment (using hand held doppler) ASSESSMENTS - Ostomy and/or Continence Assessment and Care []  - 0 Incontinence Assessment and Management []  - 0 Ostomy Care Assessment and Management (repouching, etc.) PROCESS - Coordination of Care X - Simple Patient / Family Education for ongoing care 1 15 []  - 0 Complex (extensive) Patient / Family Education for ongoing care X- 1 10 Staff obtains , Records, T Results / Process Orders est []  - 0 Staff telephones HHA, Nursing Homes / Clarify orders / etc []  - 0 Routine Transfer to another Facility (non-emergent condition) X- 1 10 Routine Hospital Admission (non-emergent condition) []  - 0 New Admissions / / Ordering NPWT Apligraf, etc. , []  - 0 Emergency Hospital Admission (emergent condition) X- 1 10 Simple Discharge Coordination []  - 0 Complex (extensive) Discharge Coordination PROCESS - Special Needs []  - 0 Pediatric / Minor Patient Management []  - 0 Isolation Patient Management []  - 0 Hearing / Language / Visual special needs []  - 0 Assessment of Community assistance (transportation, D/C  planning, etc.) []  - 0 Additional assistance / Altered mentation []  - 0 Support Surface(s) Assessment (bed, cushion, seat, etc.) INTERVENTIONS - Wound Cleansing / Measurement []  - 0 Simple Wound Cleansing -  one wound X- 5 5 Complex Wound Cleansing - multiple wounds X- 1 5 Wound Imaging (photographs - any number of wounds) []  - 0 Wound Tracing (instead of photographs) []  - 0 Simple Wound Measurement - one wound X- 5 5 Complex Wound Measurement - multiple wounds INTERVENTIONS - Wound Dressings []  - 0 Small Wound Dressing one or multiple wounds X- 2 15 Medium Wound Dressing one or multiple wounds []  - 0 Large Wound Dressing one or multiple wounds []  - 0 Application of Medications - topical []  - 0 Application of Medications - injection INTERVENTIONS - Miscellaneous []  - 0 External ear exam []  - 0 Specimen Collection (cultures, biopsies, blood, body fluids, etc.) []  - 0 Specimen(s) / Culture(s) sent or taken to Lab for analysis []  - 0 Patient Transfer (multiple staff / Nurse, adultHoyer Lift / Similar devices) []  - 0 Simple Staple / Suture removal (25 or less) []  - 0 Complex Staple / Suture removal (26 or more) []  - 0 Hypo / Hyperglycemic Management (close monitor of Blood Glucose) []  - 0 Ankle / Brachial Index (ABI) - do not check if billed separately X- 1 5 Vital Signs Has the patient been seen at the hospital within the last three years: Yes Total Score: 190 Level Of Care: New/Established - Level 5 Electronic Signature(s) Signed: 03/14/2021 5:59:57 PM By: Zenaida DeedBoehlein, Linda RN, BSN Entered By: Zenaida DeedBoehlein, Linda on 03/14/2021 12:03:24 -------------------------------------------------------------------------------- Compression Therapy Details Patient Name: Date of Service: [name] [name], [name]. 03/14/2021 10:15 A M Medical Record Number: [id number] Patient Account Number: [id number] Date of Birth/Sex: Treating RN: [birth year] (75 y.o. Cynthia StandardF) Boehlein, Linda Primary Care Mekhai Venuto: Hillard Dankerrawford,  Elizabeth Other Clinician: Referring Shaguana Love: Treating Roneka Gilpin/Extender: Jolee EwingStone III, Hoyt Crawford, Elizabeth Weeks in Treatment: 5 Compression Therapy Performed for Wound Assessment: Wound #1 Right,Circumferential Lower Leg Performed By: Clinician Fonnie MuBreedlove, Lauren, RN Compression Type: Three Layer Post Procedure Diagnosis Same as Pre-procedure Electronic Signature(s) Signed: 03/14/2021 5:59:57 PM By: Zenaida DeedBoehlein, Linda RN, BSN Entered By: Zenaida DeedBoehlein, Linda on 03/14/2021 11:33:51 -------------------------------------------------------------------------------- Compression Therapy Details Patient Name: Date of Service: [name], [name]. 03/14/2021 10:15 A M Medical Record Number: [id number] Patient Account Number: [id number] Date of Birth/Sex: Treating RN: [birth year] (75 y.o. Cynthia StandardF) Boehlein, Linda Primary Care Lyndell Gillyard: Hillard Dankerrawford, Elizabeth Other Clinician: Referring Donzell Coller: Treating Danyl Deems/Extender: Jolee EwingStone III, Hoyt Crawford, Elizabeth Weeks in Treatment: 5 Compression Therapy Performed for Wound Assessment: Wound #2 Left,Circumferential Lower Leg Performed By: Clinician Fonnie MuBreedlove, Lauren, RN Compression Type: Three Layer Post Procedure Diagnosis Same as Pre-procedure Electronic Signature(s) Signed: 03/14/2021 5:59:57 PM By: Zenaida DeedBoehlein, Linda RN, BSN Entered By: Zenaida DeedBoehlein, Linda on 03/14/2021 11:33:51 -------------------------------------------------------------------------------- Lower Extremity Assessment Details Patient Name: Date of Service: Cynthia HillockPETERSO [name], [name]. 03/14/2021 10:15 A M Medical Record Number: [id number] Patient Account Number: [id number] Date of Birth/Sex: Treating RN: [birth year] (75 y.o. Arta SilenceF) Deaton, Bobbi Primary Care Breon Rehm: Hillard Dankerrawford, Elizabeth Other Clinician: Referring Atianna Haidar: Treating Jaylin Benzel/Extender: Jolee EwingStone III, Hoyt Crawford, Elizabeth Weeks in Treatment: 5 Edema Assessment Assessed: [Left: Yes] [Right: Yes] Edema: [Left: Yes] [Right: Yes] Calf Left:  Right: Point of Measurement: 34 cm From Medial Instep 49.5 cm 52 cm Ankle Left: Right: Point of Measurement: 8 cm From Medial Instep 31 cm 34 cm Vascular Assessment Pulses: Dorsalis Pedis Palpable: [Left:No] [Right:No] Electronic Signature(s) Signed: 03/14/2021 5:48:55 PM By: Shawn Stalleaton, Bobbi Entered By: Shawn Stalleaton, Bobbi on 03/14/2021 11:01:08 -------------------------------------------------------------------------------- Multi-Disciplinary Care Plan Details Patient Name: Date of Service: Cynthia HillockPETERSO [name], [name]. 03/14/2021 10:15 A M Medical Record Number: [id number] Patient Account Number: [id number] Date of Birth/Sex: Treating RN: [birth year] (75  y.o. [name] Primary Care Allaina Brotzman: Hillard Danker Other Clinician: Referring Monicia Tse: Treating Amarrion Pastorino/Extender: Jolee Ewing in Treatment: 5 Multidisciplinary Care Plan reviewed with physician Active Inactive Electronic Signature(s) Signed: 06/29/2021 2:05:03 PM By: Zenaida Deed RN, BSN Previous Signature: 03/14/2021 5:59:57 PM Version By: Zenaida Deed RN, BSN Entered By: Zenaida Deed on 06/26/2021 18:01:53 -------------------------------------------------------------------------------- Pain Assessment Details Patient Name: Date of Service: [name], [name]. 03/14/2021 10:15 A M Medical Record Number: [id number] Patient Account Number: [id number] Date of Birth/Sex: Treating RN: 07-12-1946 (75 y.o. Arta Silence Primary Care Daren Yeagle: Hillard Danker Other Clinician: Referring Arriona Prest: Treating Astryd Pearcy/Extender: Jolee Ewing in Treatment: 5 Active Problems Location of Pain Severity and Description of Pain Patient Has Paino Yes Site Locations Pain Location: Generalized Pain, Pain in Ulcers Duration of the Pain. Constant / Intermittento Constant Rate the pain. Current Pain Level: 8 Worst Pain Level: 10 Least Pain Level: 0 Tolerable Pain Level:  8 Character of Pain Describe the Pain: Aching, Heavy, Sharp Pain Management and Medication Current Pain Management: Medication: Yes Cold Application: No Rest: Yes Massage: No Activity: No T.E.[name].S.: No Heat Application: No Leg drop or elevation: Yes Is the Current Pain Management Adequate: Inadequate How does your wound impact your activities of daily livingo Sleep: Yes Bathing: No Appetite: No Relationship With Others: No Bladder Continence: No Emotions: No Bowel Continence: No Work: No Toileting: No Drive: No Dressing: No Hobbies: Yes Electronic Signature(s) Signed: 03/14/2021 5:48:55 PM By: Shawn Stall Entered By: Shawn Stall on 03/14/2021 11:00:33 -------------------------------------------------------------------------------- Patient/Caregiver Education Details Patient Name: Date of Service: [name], [name]. 4/6/2022andnbsp10:15 A M Medical Record Number: [id number] Patient Account Number: [id number] Date of Birth/Gender: Treating RN: 1946-02-27 (75 y.o. [name] Primary Care Physician: Hillard Danker Other Clinician: Referring Physician: Treating Physician/Extender: Jolee Ewing in Treatment: 5 Education Assessment Education Provided To: Patient Education Topics Provided Venous: Methods: Explain/Verbal Responses: Reinforcements needed, State content correctly Wound/Skin Impairment: Methods: Explain/Verbal Responses: Reinforcements needed, State content correctly Electronic Signature(s) Signed: 03/14/2021 5:59:57 PM By: Zenaida Deed RN, BSN Entered By: Zenaida Deed on 03/14/2021 11:20:32 -------------------------------------------------------------------------------- Wound Assessment Details Patient Name: Date of Service: [name], [name]. 03/14/2021 10:15 A M Medical Record Number: [id number] Patient Account Number: [id number] Date of Birth/Sex: Treating RN: [birth year] (75 y.o. [name],  Millard.Loa Primary Care Eathon Valade: Hillard Danker Other Clinician: Referring Mivaan Corbitt: Treating Damya Comley/Extender: Jolee Ewing in Treatment: 5 Wound Status Wound Number: 1 Primary Lymphedema Etiology: Wound Location: Right, Circumferential Lower Leg Wound Open Wounding Event: Gradually Appeared Status: Date Acquired: 12/09/2020 Comorbid Hypertension, Hypotension, Myocardial Infarction, Peripheral Weeks Of Treatment: 5 History: Venous Disease, Type II Diabetes, Neuropathy Clustered Wound: No Photos Wound Measurements Length: (cm) 12.5 Width: (cm) 23 Depth: (cm) 0.1 Area: (cm) 225.802 Volume: (cm) 22.58 % Reduction in Area: 23.1% % Reduction in Volume: 23.1% Epithelialization: Small (1-33%) Tunneling: No Undermining: No Wound Description Classification: Full Thickness Without Exposed Support Structures Wound Margin: Flat and Intact Exudate Amount: Large Exudate Type: Purulent Exudate Color: yellow, brown, green Foul Odor After Cleansing: Yes Due to Product Use: No Slough/Fibrino Yes Wound Bed Granulation Amount: Large (67-100%) Exposed Structure Granulation Quality: Red Fascia Exposed: No Necrotic Amount: Small (1-33%) Fat Layer (Subcutaneous Tissue) Exposed: Yes Necrotic Quality: Adherent Slough Tendon Exposed: No Muscle Exposed: No Joint Exposed: No Bone Exposed: No Electronic Signature(s) Signed: 03/14/2021 5:48:55 PM By: Shawn Stall Signed: 03/15/2021 4:58:23 PM By: Karl Ito Entered By: Karl Ito  on 03/14/2021 16:04:41 -------------------------------------------------------------------------------- Wound Assessment Details Patient Name: Date of Service: [name], [name] 03/14/2021 10:15 A M Medical Record Number: [id number] Patient Account Number: [id number] Date of Birth/Sex: Treating RN: 1946-10-16 (75 y.o. [name], [name] Primary Care Natanael Saladin: Hillard Danker Other Clinician: Referring  Mykah Bellomo: Treating Tawan Corkern/Extender: Jolee Ewing in Treatment: 5 Wound Status Wound Number: 2 Primary Lymphedema Etiology: Wound Location: Left, Circumferential Lower Leg Wound Open Wounding Event: Gradually Appeared Status: Date Acquired: 12/09/2020 Comorbid Hypertension, Hypotension, Myocardial Infarction, Peripheral Weeks Of Treatment: 5 History: Venous Disease, Type II Diabetes, Neuropathy Clustered Wound: No Photos Wound Measurements Length: (cm) 11 Width: (cm) 21 Depth: (cm) 0.1 Area: (cm) 181.427 Volume: (cm) 18.143 % Reduction in Area: 30% % Reduction in Volume: 30% Epithelialization: Small (1-33%) Tunneling: No Undermining: No Wound Description Classification: Full Thickness Without Exposed Support Structures Wound Margin: Flat and Intact Exudate Amount: Large Exudate Type: Purulent Exudate Color: yellow, brown, green Foul Odor After Cleansing: Yes Due to Product Use: No Slough/Fibrino Yes Wound Bed Granulation Amount: Medium (34-66%) Exposed Structure Granulation Quality: Red, Pink Fascia Exposed: No Necrotic Amount: Medium (34-66%) Fat Layer (Subcutaneous Tissue) Exposed: Yes Necrotic Quality: Adherent Slough Tendon Exposed: No Muscle Exposed: No Joint Exposed: No Bone Exposed: No Electronic Signature(s) Signed: 03/14/2021 5:48:55 PM By: Shawn Stall Signed: 03/15/2021 4:58:23 PM By: Karl Ito Entered By: Karl Ito on 03/14/2021 16:04:03 -------------------------------------------------------------------------------- Wound Assessment Details Patient Name: Date of Service: [name], Lastacia [name]. 03/14/2021 10:15 A M Medical Record Number: [id number] Patient Account Number: [id number] Date of Birth/Sex: Treating RN: 04-Jan-1946 (75 y.o. [name], [name] Primary Care Juliocesar Blasius: Hillard Danker Other Clinician: Referring Braedyn Riggle: Treating Shauntea Lok/Extender: Jolee Ewing in  Treatment: 5 Wound Status Wound Number: 3 Primary Lymphedema Etiology: Wound Location: Right, Dorsal Foot Wound Open Wounding Event: Gradually Appeared Status: Date Acquired: 03/14/2021 Comorbid Hypertension, Hypotension, Myocardial Infarction, Peripheral Weeks Of Treatment: 0 History: Venous Disease, Type II Diabetes, Neuropathy Clustered Wound: No Photos Wound Measurements Length: (cm) 1.5 Width: (cm) 2 Depth: (cm) 0.1 Area: (cm) 2.356 Volume: (cm) 0.236 % Reduction in Area: 0% % Reduction in Volume: 0% Epithelialization: None Tunneling: No Undermining: No Wound Description Classification: Full Thickness Without Exposed Support Structures Wound Margin: Distinct, outline attached Exudate Amount: Large Exudate Type: Serosanguineous Exudate Color: red, brown Foul Odor After Cleansing: No Slough/Fibrino Yes Wound Bed Granulation Amount: Medium (34-66%) Exposed Structure Granulation Quality: Red, Pink Fascia Exposed: No Necrotic Amount: Medium (34-66%) Fat Layer (Subcutaneous Tissue) Exposed: Yes Necrotic Quality: Adherent Slough Tendon Exposed: No Muscle Exposed: No Joint Exposed: No Bone Exposed: No Electronic Signature(s) Signed: 03/14/2021 5:48:55 PM By: Shawn Stall Signed: 03/15/2021 4:58:23 PM By: Karl Ito Entered By: Karl Ito on 03/14/2021 16:02:12 -------------------------------------------------------------------------------- Wound Assessment Details Patient Name: Date of Service: [name], [name]. 03/14/2021 10:15 A M Medical Record Number: [id number] Patient Account Number: [id number] Date of Birth/Sex: Treating RN: November 19, 1946 (75 y.o. [name], [name] Primary Care Gordon Vandunk: Hillard Danker Other Clinician: Referring Novaleigh Kohlman: Treating Deleah Tison/Extender: Jolee Ewing in Treatment: 5 Wound Status Wound Number: 4 Primary Lymphedema Etiology: Wound Location: Left, Dorsal Foot Wound Open Wounding  Event: Gradually Appeared Status: Date Acquired: 03/14/2021 Comorbid Hypertension, Hypotension, Myocardial Infarction, Peripheral Weeks Of Treatment: 0 History: Venous Disease, Type II Diabetes, Neuropathy Clustered Wound: No Photos Wound Measurements Length: (cm) 3.8 Width: (cm) 2 Depth: (cm) 0.1 Area: (cm) 5.969 Volume: (cm) 0.597 % Reduction in Area: 0% % Reduction in Volume: 0% Epithelialization: None Tunneling:  No Undermining: No Wound Description Classification: Full Thickness Without Exposed Support Structures Wound Margin: Distinct, outline attached Exudate Amount: Large Exudate Type: Purulent Exudate Color: yellow, brown, green Foul Odor After Cleansing: No Slough/Fibrino Yes Wound Bed Granulation Amount: Small (1-33%) Exposed Structure Granulation Quality: Red Fascia Exposed: No Necrotic Amount: Large (67-100%) Fat Layer (Subcutaneous Tissue) Exposed: Yes Necrotic Quality: Adherent Slough Tendon Exposed: No Muscle Exposed: No Joint Exposed: No Bone Exposed: No Electronic Signature(s) Signed: 03/14/2021 5:48:55 PM By: Shawn Stall Signed: 03/15/2021 4:58:23 PM By: Karl Ito Entered By: Karl Ito on 03/14/2021 16:02:53 -------------------------------------------------------------------------------- Wound Assessment Details Patient Name: Date of Service: [name], [name]. 03/14/2021 10:15 A M Medical Record Number: [id number] Patient Account Number: [id number] Date of Birth/Sex: Treating RN: [birth year] (75 y.o. [name], [name] Primary Care Enes Rokosz: Hillard Danker Other Clinician: Referring Eduar Kumpf: Treating Raylin Diguglielmo/Extender: Jolee Ewing in Treatment: 5 Wound Status Wound Number: 5 Primary Pressure Ulcer Etiology: Wound Location: Coccyx Wound Open Wounding Event: Pressure Injury Status: Date Acquired: 02/28/2021 Comorbid Hypertension, Hypotension, Myocardial Infarction, Peripheral Weeks Of Treatment:  0 History: Venous Disease, Type II Diabetes, Neuropathy Clustered Wound: No Photos Wound Measurements Length: (cm) 1.2 Width: (cm) 0.4 Depth: (cm) 0.2 Area: (cm) 0.377 Volume: (cm) 0.075 % Reduction in Area: 0% % Reduction in Volume: 0% Epithelialization: None Tunneling: No Undermining: No Wound Description Classification: Category/Stage II Wound Margin: Distinct, outline attached Exudate Amount: Medium Exudate Type: Serosanguineous Exudate Color: red, brown Foul Odor After Cleansing: No Slough/Fibrino No Wound Bed Granulation Amount: Large (67-100%) Exposed Structure Granulation Quality: Red Fascia Exposed: No Necrotic Amount: None Present (0%) Fat Layer (Subcutaneous Tissue) Exposed: Yes Tendon Exposed: No Muscle Exposed: No Joint Exposed: No Bone Exposed: No Electronic Signature(s) Signed: 03/14/2021 5:48:55 PM By: Shawn Stall Signed: 03/15/2021 4:58:23 PM By: Karl Ito Entered By: Karl Ito on 03/14/2021 16:05:03 -------------------------------------------------------------------------------- Vitals Details Patient Name: Date of Service: [name]. 03/14/2021 10:15 A M Medical Record Number: [id number] Patient Account Number: [id number] Date of Birth/Sex: Treating RN: [birth year] (75 y.o. [name], [name] Primary Care Ramondo Dietze: Hillard Danker Other Clinician: Referring Clela Hagadorn: Treating Evalyn Shultis/Extender: Jolee Ewing in Treatment: 5 Vital Signs Time Taken: 10:45 Temperature ([name]): 98.3 Height (in): 64 Pulse (bpm): 86 Weight (lbs): 200 Respiratory Rate (breaths/min): 20 Body Mass Index (BMI): 34.3 Blood Pressure (mmHg): 160/75 Reference Range: 80 - 120 mg / dl Electronic Signature(s) Signed: 03/14/2021 5:48:55 PM By: Shawn Stall Entered By: Shawn Stall on 03/14/2021 11:00:02

## 2021-03-15 NOTE — Evaluation (Signed)
Physical Therapy Evaluation Patient Details Name: [name] MRN: [id number] DOB: 1946/10/01 Today's Date: 03/15/2021   History of Present Illness  75 y.o. female admitted for Bilateral lower extremity cellulitis, with poorly controlled diabetes mellitus. PMHx:diabetes mellitus type 2, essential hypertension, TVRS, coronary artery disease with an NSTEMI with a cath on May 2021 which showed nonobstructive disease, with chronic diastolic heart failure by echo with an EF of 60%, chronic venous insufficiency with chronic wound  Clinical Impression  Pt admitted with above diagnosis.  Pt currently with functional limitations due to the deficits listed below (see PT Problem List). Pt will benefit from skilled PT to increase their independence and safety with mobility to allow discharge to the venue listed below.   Pt assisted with ambulating in hallway however limited by "wooziness."  Pt also with occasional steadying assist required during mobility.  Pt from home alone and would benefit from post acute rehab in SNF.    Follow Up Recommendations SNF    Equipment Recommendations  None recommended by PT    Recommendations for Other Services       Precautions / Restrictions Precautions Precautions: Fall      Mobility  Bed Mobility Overal bed mobility: Needs Assistance Bed Mobility: Supine to Sit     Supine to sit: Min assist     General bed mobility comments: assist for trunk upright    Transfers Overall transfer level: Needs assistance Equipment used: Rolling walker (2 wheeled) Transfers: Sit to/from Stand Sit to Stand: Min assist         General transfer comment: assist to rise and steady, pt with posterior LOB x3 with attempting to pull up mesh undergarments requiring assist  Ambulation/Gait Ambulation/Gait assistance: Min guard Gait Distance (Feet): 100 Feet Assistive device: Rolling walker (2 wheeled) Gait Pattern/deviations: Step-through pattern;Decreased  stride length     General Gait Details: pt drifting to left and reports wooziness, recliner following for safety and pt's wooziness increased so pt sat down in recliner; vitals upon return to room: 121/52 mmHg, 75 bpm, 94% on room air  Stairs            Wheelchair Mobility    Modified Rankin (Stroke Patients Only)       Balance                                             Pertinent Vitals/Pain Pain Assessment: Faces Faces Pain Scale: Hurts a little bit Pain Location: feet with positioning on floor Pain Descriptors / Indicators: Other (Comment) ("feels like I'm stepping on rocks") Pain Intervention(s): Monitored during session;Repositioned    Home Living Family/patient expects to be discharged to:: Private residence Living Arrangements: Alone   Type of Home: House Home Access: Ramped entrance     Home Layout: One level Home Equipment: Cane - single point;Walker - 4 wheels      Prior Function Level of Independence: Independent with assistive device(s)         Comments: pt reports furniture walking in home, uses rollator or cane for community     Hand Dominance        Extremity/Trunk Assessment        Lower Extremity Assessment Lower Extremity Assessment: Generalized weakness (bil lower legs wrapped)    Cervical / Trunk Assessment Cervical / Trunk Assessment: Normal  Communication   Communication: No difficulties  Cognition Arousal/Alertness:  Awake/alert Behavior During Therapy: WFL for tasks assessed/performed Overall Cognitive Status: Within Functional Limits for tasks assessed                                        General Comments      Exercises     Assessment/Plan    PT Assessment Patient needs continued PT services  PT Problem List Decreased balance;Decreased knowledge of use of DME;Decreased activity tolerance;Decreased strength;Decreased mobility;Decreased skin integrity       PT Treatment  Interventions DME instruction;Gait training;Balance training;Functional mobility training;Therapeutic exercise;Therapeutic activities;Patient/family education    PT Goals (Current goals can be found in the Care Plan section)  Acute Rehab PT Goals PT Goal Formulation: With patient Time For Goal Achievement: 03/29/21 Potential to Achieve Goals: Good    Frequency Min 2X/week   Barriers to discharge        Co-evaluation               AM-PAC PT "6 Clicks" Mobility  Outcome Measure Help needed turning from your back to your side while in a flat bed without using bedrails?: A Lot Help needed moving from lying on your back to sitting on the side of a flat bed without using bedrails?: A Lot Help needed moving to and from a bed to a chair (including a wheelchair)?: A Lot Help needed standing up from a chair using your arms (e.g., wheelchair or bedside chair)?: A Lot Help needed to walk in hospital room?: A Lot Help needed climbing 3-5 steps with a railing? : Total 6 Click Score: 11    End of Session Equipment Utilized During Treatment: Gait belt Activity Tolerance: Patient tolerated treatment well Patient left: with call bell/phone within reach;in bed;with chair alarm set   PT Visit Diagnosis: Difficulty in walking, not elsewhere classified (R26.2);Unsteadiness on feet (R26.81)    Time: 2229-7989 PT Time Calculation (min) (ACUTE ONLY): 28 min   Charges:   PT Evaluation $PT Eval Moderate Complexity: 1 Mod PT Treatments $Gait Training: 8-22 mins       Thomasene Mohair PT, DPT Acute Rehabilitation Services Pager: [phone] Office: [phone]   Maida Sale E 03/15/2021, 12:19 PM

## 2021-03-16 DIAGNOSIS — L03119 Cellulitis of unspecified part of limb: Secondary | ICD-10-CM | POA: Diagnosis not present

## 2021-03-16 DIAGNOSIS — I10 Essential (primary) hypertension: Secondary | ICD-10-CM | POA: Diagnosis not present

## 2021-03-16 DIAGNOSIS — E11628 Type 2 diabetes mellitus with other skin complications: Secondary | ICD-10-CM | POA: Diagnosis not present

## 2021-03-16 DIAGNOSIS — R600 Localized edema: Secondary | ICD-10-CM | POA: Diagnosis not present

## 2021-03-16 LAB — GLUCOSE, CAPILLARY
Glucose-Capillary: 229 mg/dL — ABNORMAL HIGH (ref 70–99)
Glucose-Capillary: 247 mg/dL — ABNORMAL HIGH (ref 70–99)
Glucose-Capillary: 128 mg/dL — ABNORMAL HIGH (ref 70–99)
Glucose-Capillary: 150 mg/dL — ABNORMAL HIGH (ref 70–99)
Glucose-Capillary: 185 mg/dL — ABNORMAL HIGH (ref 70–99)

## 2021-03-16 LAB — CREATININE, SERUM
Creatinine, Ser: 0.91 mg/dL (ref 0.44–1.00)
GFR, Estimated: >60 mL/min (ref >60)

## 2021-03-16 MED ORDER — INSULIN GLARGINE 100 UNIT/ML ~~LOC~~ SOLN
25.0000 [IU] | Freq: Two times a day (BID) | SUBCUTANEOUS | Status: DC
  Administered 2021-03-16 – 2021-03-18 (×5): 25 [IU] via SUBCUTANEOUS
  Filled 2021-03-16 (×6): qty 0.25

## 2021-03-16 MED ORDER — ONDANSETRON HCL 4 MG/2ML IJ SOLN
4.0000 mg | Freq: Four times a day (QID) | INTRAMUSCULAR | Status: DC | PRN
  Administered 2021-03-16 – 2021-03-17 (×4): 4 mg via INTRAVENOUS
  Filled 2021-03-16 (×4): qty 2

## 2021-03-16 MED ORDER — DOXYCYCLINE HYCLATE 100 MG PO TABS
100.0000 mg | ORAL_TABLET | Freq: Two times a day (BID) | ORAL | Status: DC
  Administered 2021-03-16 – 2021-03-18 (×5): 100 mg via ORAL
  Filled 2021-03-16 (×5): qty 1

## 2021-03-16 MED ORDER — SULFAMETHOXAZOLE-TRIMETHOPRIM 800-160 MG PO TABS: 1.0000 | ORAL_TABLET | Freq: Two times a day (BID) | ORAL | Status: DC

## 2021-03-16 NOTE — Plan of Care (Signed)
  Problem: Education: Goal: Knowledge of General Education information will improve Description Including pain rating scale, medication(s)/side effects and non-pharmacologic comfort measures Outcome: Progressing   

## 2021-03-16 NOTE — Progress Notes (Addendum)
[facility] PROGRESS NOTE    Progress Note  [name]  VEL:[id number] DOB: 1946/05/19 DOA: 03/14/2021 PCP: Myrlene Broker, MD     Brief Narrative:   [name] is an 75 y.o. female past medical history of diabetes mellitus type 2, essential hypertension, TVRS, coronary artery disease with an NSTEMI with a cath on May 2021 which showed nonobstructive disease, with chronic diastolic heart failure by echo with an EF of 60%, chronic venous insufficiency with chronic wound with ongoing on Bactrim bilateral lower extremity pain.  She comes in as her pain got worst weeping and swelling over the last week, she saw her PCP who started her on Bactrim and increase her p.o. Lasix was referred to the ED by the wound care specialist as there was a concern of cellulitis.  Patient denies any fever.  Antibiotics: Doxy  Microbiology data: Blood culture:  Procedures: None  Assessment/Plan:   Bilateral lower extremity cellulitis, purulent with poorly controlled diabetes mellitus: She failed 1 week of Bactrim as an outpatient, she does have a history of MRSA. IV vancomycin and Rocephin on admission she defervesced her leukocytosis improved. Physical therapy evaluated the patient and they recommended physical therapy patient refused physical therapy she would want to go home. She was changed to oral Doxy she can continues this as an outpatient for 7 days.  Bilateral lower extremity edema: Good urine output she is negative for all 4 L. Continue IV Lasix for 1 additional day continue strict I's and O's and daily weights. Check a basic metabolic panel tomorrow morning.  Poorly controlled diabetes mellitus with peripheral vascular disease: Glucose trending up continue to hold her oral hypoglycemic agents. Increase long-acting insulin insulin sliding scale she is only noncompliant with her medications at home, her A1c in house was 12.6. She has been counseled.  Chronic  bilateral lower extremity wound with chronic venous insufficiency: Wound care was consulted and recommended topical treatment with dressing changes Tuesday Thursdays and Saturdays and apply Xeroform base.  Essential hypertension: Pressure slowly improving, continue metoprolol and Lasix.  Hyperlipidemia associated with type 2 diabetes mellitus (HCC) Continue statin Statins..  Status post CABG: Continue aspirin.   Stage II sacral decubitus ulcer present on admission:  RN Pressure Injury Documentation: Pressure Injury 05/11/20 Coccyx Medial Stage 2 -  Partial thickness loss of dermis presenting as a shallow open injury with a red, pink wound bed without slough. Shallow open area between buttocks (Active)  05/11/20 1500  Location: Coccyx  Location Orientation: Medial  Staging: Stage 2 -  Partial thickness loss of dermis presenting as a shallow open injury with a red, pink wound bed without slough.  Wound Description (Comments): Shallow open area between buttocks  Present on Admission: Yes    Estimated body mass index is 33.13 kg/m as calculated from the following:   Height as of this encounter: 5\' 4"  (1.626 m).   Weight as of this encounter: 87.5 kg.    DVT prophylaxis: lovenox Family Communication:none Status is: Observation  The patient will require care spanning > 2 midnights and should be moved to inpatient because: Hemodynamically unstable  Dispo: The patient is from: Home              Anticipated d/c is to: Home              Patient currently is not medically stable to d/c.   Difficult to place patient No        Code Status:  Code Status Orders  (From admission, onward)         Start     Ordered   03/14/21 1523  Full code  Continuous        03/14/21 1522        Code Status History    Date Active Date Inactive Code Status Order ID Comments User Context   05/11/2020 1051 05/19/2020 2033 Full Code [id number]  Clydie Braun, MD ED   05/05/2020 2113  05/10/2020 1517 Full Code [id number]  Corrin Parker, PA-C Inpatient   12/19/2014 1319 12/22/2014 1727 Full Code [id number]  Albertine Grates, MD Inpatient   03/26/2014 1156 03/31/2014 1444 Full Code [id number]  Genelle Gather, MD Inpatient   Advance Care Planning Activity        IV Access:    Peripheral IV   Procedures and diagnostic studies:   No results found.   Medical Consultants:    None.   Subjective:    [name] there is great caution if she does not want to go to a skilled nursing facility.  Objective:    Vitals:   03/15/21 0501 03/15/21 1333 03/15/21 2208 03/16/21 0624  BP: (!) 112/51 (!) 105/58 (!) 147/60 (!) 144/67  Pulse: 78 72 81 67  Resp: 16 16 18 16   Temp: 99.5 F (37.5 C) 98.1 F (36.7 C) 98.6 F (37 C) 97.9 F (36.6 C)  TempSrc: Oral Oral Oral   SpO2: 94% 93% 93% 93%  Weight:      Height:       SpO2: 93 %   Intake/Output Summary (Last 24 hours) at 03/16/2021 0918 Last data filed at 03/16/2021 05/16/2021 Gross per 24 hour  Intake 1229.81 ml  Output 4250 ml  Net -3020.19 ml   Filed Weights   03/14/21 1352  Weight: 87.5 kg    Exam: General exam: In no acute distress. Respiratory system: Good air movement and clear to auscultation. Cardiovascular system: S1 & S2 heard, RRR. No JVD. Gastrointestinal system: Abdomen is nondistended, soft and nontender.  Extremities: No pedal edema. Skin: As her wraped, erythema seems to be receding. Psychiatry: Judgement and insight appear normal. Mood & affect appropriate.   Data Reviewed:    Labs: Basic Metabolic Panel: Recent Labs  Lab 03/14/21 1333 03/15/21 0305 03/16/21 0302  NA 138 135  --   K 4.3 4.1  --   CL 100 95*  --   CO2 30 32  --   GLUCOSE 330* 196*  --   BUN 15 15  --   CREATININE 0.83 0.75 0.91  CALCIUM 9.5 8.7*  --    GFR Estimated Creatinine Clearance: 58.1 mL/min (by C-G formula based on SCr of 0.91 mg/dL). Liver Function Tests: Recent Labs  Lab 03/14/21 1333   AST 16  ALT 20  ALKPHOS 180*  BILITOT 0.3  PROT 8.4*  ALBUMIN 3.8   No results for input(s): LIPASE, AMYLASE in the last 168 hours. No results for input(s): AMMONIA in the last 168 hours. Coagulation profile No results for input(s): INR, PROTIME in the last 168 hours. COVID-19 Labs  No results for input(s): DDIMER, FERRITIN, LDH, CRP in the last 72 hours.  Lab Results  Component Value Date   SARSCOV2NAA NEGATIVE 03/14/2021   SARSCOV2NAA NEGATIVE 05/11/2020   SARSCOV2NAA NEGATIVE 05/05/2020    CBC: Recent Labs  Lab 03/14/21 1333 03/15/21 0305  WBC 8.2 9.9  NEUTROABS 5.6  --   HGB 14.6 12.7  HCT 45.9 39.2  MCV 84.2 84.1  PLT 231 201   Cardiac Enzymes: No results for input(s): CKTOTAL, CKMB, CKMBINDEX, TROPONINI in the last 168 hours. BNP (last 3 results) Recent Labs    06/15/20 1528 02/22/21 1536  PROBNP 308* 55.0   CBG: Recent Labs  Lab 03/15/21 0731 03/15/21 1137 03/15/21 1708 03/15/21 2205 03/16/21 0729  GLUCAP 162* 199* 127* 186* 229*   D-Dimer: No results for input(s): DDIMER in the last 72 hours. Hgb A1c: Recent Labs    03/14/21 1706  HGBA1C 12.6*   Lipid Profile: No results for input(s): CHOL, HDL, LDLCALC, TRIG, CHOLHDL, LDLDIRECT in the last 72 hours. Thyroid function studies: No results for input(s): TSH, T4TOTAL, T3FREE, THYROIDAB in the last 72 hours.  Invalid input(s): FREET3 Anemia work up: No results for input(s): VITAMINB12, FOLATE, FERRITIN, TIBC, IRON, RETICCTPCT in the last 72 hours. Sepsis Labs: Recent Labs  Lab 03/14/21 1333 03/14/21 1706 03/15/21 0305  WBC 8.2  --  9.9  LATICACIDVEN 1.0 1.1  --    Microbiology Recent Results (from the past 240 hour(s))  Aerobic Culture w Gram Stain (superficial specimen)     Status: None   Collection Time: 03/07/21 12:25 PM   Specimen: Leg  Result Value Ref Range Status   Specimen Description   Final    LEG RIGHT LOWER Performed at [facility], [address] [place], [place] [address]    Special Requests   Final    NONE Performed at [facility], [address] [place], [place] [address]    Gram Stain   Final    NO WBC SEEN ABUNDANT GRAM POSITIVE COCCI Performed at [facility], [address] [place], [place] [address]    Culture   Final    ABUNDANT METHICILLIN RESISTANT STAPHYLOCOCCUS AUREUS   Report Status 03/11/2021 FINAL  Final   Organism ID, Bacteria METHICILLIN RESISTANT STAPHYLOCOCCUS AUREUS  Final      Susceptibility   Methicillin resistant staphylococcus aureus - MIC*    CIPROFLOXACIN >=8 RESISTANT Resistant     ERYTHROMYCIN <=0.25 SENSITIVE Sensitive     GENTAMICIN <=0.5 SENSITIVE Sensitive     OXACILLIN >=4 RESISTANT Resistant     TETRACYCLINE <=1 SENSITIVE Sensitive     VANCOMYCIN 1 SENSITIVE Sensitive     TRIMETH/SULFA <=10 SENSITIVE Sensitive     CLINDAMYCIN <=0.25 SENSITIVE Sensitive     RIFAMPIN <=0.5 SENSITIVE Sensitive     Inducible Clindamycin NEGATIVE Sensitive     * ABUNDANT METHICILLIN RESISTANT STAPHYLOCOCCUS AUREUS  Culture, blood (routine x 2)     Status: None (Preliminary result)   Collection Time: 03/14/21  1:33 PM   Specimen: BLOOD  Result Value Ref Range Status   Specimen Description   Final    BLOOD LEFT ANTECUBITAL Performed at [facility], [address] [place], [place] [address]    Special Requests   Final    BOTTLES DRAWN AEROBIC AND ANAEROBIC Blood Culture results may not be optimal due to an inadequate volume of blood received in culture bottles Performed at [facility], [address] [place], [place] [address]    Culture   Final    NO GROWTH 2 DAYS Performed at [facility], [address] [place], [place] [address]    Report Status PENDING  Incomplete  Culture, blood (routine x 2)     Status: None (Preliminary result)   Collection Time: 03/14/21  1:33 PM   Specimen: BLOOD  Result Value Ref Range  Status   Specimen Description   Final    BLOOD RIGHT ANTECUBITAL Performed at [facility], [address] [place], [place] [address]    Special Requests   Final    BOTTLES DRAWN AEROBIC AND ANAEROBIC Blood Culture results may not be optimal due to an inadequate volume of blood received in culture bottles Performed at [facility], [address], [place], [place] [address]    Culture   Final    NO GROWTH 2 DAYS Performed at [facility], [address], [place], [place] [address]    Report Status PENDING  Incomplete  Resp Panel by RT-PCR (Flu A&B, Covid) Nasopharyngeal Swab     Status: None   Collection Time: 03/14/21  2:26 PM   Specimen: Nasopharyngeal Swab; Nasopharyngeal(NP) swabs in vial transport medium  Result Value Ref Range Status   SARS Coronavirus 2 by RT PCR NEGATIVE NEGATIVE Final    Comment: (NOTE) SARS-CoV-2 target nucleic acids are NOT DETECTED.  The SARS-CoV-2 RNA is generally detectable in upper respiratory specimens during the acute phase of infection. The lowest concentration of SARS-CoV-2 viral copies this assay can detect is 138 copies/mL. A negative result does not preclude SARS-Cov-2 infection and should not be used as the sole basis for treatment or other patient management decisions. A negative result may occur with  improper specimen collection/handling, submission of specimen other than nasopharyngeal swab, presence of viral mutation(s) within the areas targeted by this assay, and inadequate number of viral copies(<138 copies/mL). A negative result must be combined with clinical observations, patient history, and epidemiological information. The expected result is Negative.  Fact Sheet for Patients:  BloggerCourse.com  Fact Sheet for Healthcare Providers:  SeriousBroker.it  This test is no t yet approved or cleared by the [place] FDA and  has been  authorized for detection and/or diagnosis of SARS-CoV-2 by FDA under an Emergency Use Authorization (EUA). This EUA will remain  in effect (meaning this test can be used) for the duration of the COVID-19 declaration under Section 564(b)(1) of the Act, 21 U.S.C.section 360bbb-3(b)(1), unless the authorization is terminated  or revoked sooner.       Influenza A by PCR NEGATIVE NEGATIVE Final   Influenza B by PCR NEGATIVE NEGATIVE Final    Comment: (NOTE) The Xpert Xpress SARS-CoV-2/FLU/RSV plus assay is intended as an aid in the diagnosis of influenza from Nasopharyngeal swab specimens and should not be used as a sole basis for treatment. Nasal washings and aspirates are unacceptable for Xpert Xpress SARS-CoV-2/FLU/RSV testing.  Fact Sheet for Patients: BloggerCourse.com  Fact Sheet for Healthcare Providers: SeriousBroker.it  This test is not yet approved or cleared by the [place] FDA and has been authorized for detection and/or diagnosis of SARS-CoV-2 by FDA under an Emergency Use Authorization (EUA). This EUA will remain in effect (meaning this test can be used) for the duration of the COVID-19 declaration under Section 564(b)(1) of the Act, 21 U.S.C. section 360bbb-3(b)(1), unless the authorization is terminated or revoked.  Performed at [facility], [address], [place], [place] [address]      Medications:   . aspirin EC  81 mg Oral Daily  . atorvastatin  80 mg Oral Daily  . citalopram  20 mg Oral Daily  . enoxaparin (LOVENOX) injection  40 mg Subcutaneous Q24H  . ezetimibe  10 mg Oral Daily  . furosemide  40 mg Intravenous BID  . gabapentin  300 mg Oral TID  . insulin aspart  0-20 Units Subcutaneous TID WC  . insulin aspart  0-5 Units Subcutaneous QHS  . insulin aspart  4 Units Subcutaneous TID WC  . insulin glargine  15 Units Subcutaneous BID  . metoprolol tartrate  12.5 mg Oral BID   . pantoprazole  40 mg Oral Daily   Continuous Infusions: . sodium chloride 10 mL/hr at 03/14/21 1838  . cefTRIAXone (ROCEPHIN)  IV 1 g (03/16/21 0903)  . vancomycin 1,500 mg (03/15/21 1149)      LOS: 1 day   Marinda Elk  [facility]  03/16/2021, 9:18 AM

## 2021-03-16 NOTE — Plan of Care (Signed)
Plan of care discussed with pt.

## 2021-03-16 NOTE — TOC Initial Note (Signed)
Transition of Care [facility]) - Initial/Assessment Note   Patient Details  Name: [name] MRN: [id number] Date of Birth: [birth year]  Transition of Care [facility]) CM/SW Contact:    Ewing Schlein, LCSW Phone Number: 03/16/2021, 1:22 PM  Clinical Narrative: Patient is a 75 year old female who was admitted for cellulitis in diabetic foot. PT evaluation recommended SNF. CSW spoke with patient regarding PT recommendations. Patient is declining SNF and would prefer to discharge home with [facility]. Orders have been placed for PT, OT, and RN. Patient has a rolling walker, cane, and rollator so there are no DME needs at this time.  CSW made referrals to several [facility] agencies and patient was declined by all of the following:  [facility]  TOC to continue looking for an accepting [facility] agency.  Expected Discharge Plan: Home w Home Health Services Barriers to Discharge: No Home Care Agency will accept this patient,Continued Medical Work up  Patient Goals and CMS Choice Patient states their goals for this hospitalization and ongoing recovery are:: Discharge home with [facility] (PT, OT, RN) CMS Medicare.gov Compare Post Acute Care list provided to:: Patient Choice offered to / list presented to : Patient  Expected Discharge Plan and Services Expected Discharge Plan: Home w Home Health Services In-house Referral: Clinical Social Work Post Acute Care Choice: Home Health Living arrangements for the past 2 months: Single Family Home            DME Arranged: N/A DME Agency: NA  Prior Living Arrangements/Services Living arrangements for the past 2 months: Single Family Home Lives with:: Self Patient language and need for interpreter reviewed:: Yes Do you feel safe going back to the place where you live?: Yes      Need for Family Participation in Patient Care: No (Comment) Care giver support system in place?: Yes (comment) Current home services: DME (Rolling walker,  cane, rollatory) Criminal Activity/Legal Involvement Pertinent to Current Situation/Hospitalization: No - Comment as needed  Activities of Daily Living Home Assistive Devices/Equipment: Other (Comment),Walker (specify type),Eyeglasses (2 surgical shoes, rolator, reading glasses) ADL Screening (condition at time of admission) Patient's cognitive ability adequate to safely complete daily activities?: Yes Is the patient deaf or have difficulty hearing?: No Does the patient have difficulty seeing, even when wearing glasses/contacts?: Yes Does the patient have difficulty concentrating, remembering, or making decisions?: Yes Patient able to express need for assistance with ADLs?: Yes Does the patient have difficulty dressing or bathing?: No Independently performs ADLs?: Yes (appropriate for developmental age) Does the patient have difficulty walking or climbing stairs?: Yes Weakness of Legs: None Weakness of Arms/Hands: None  Permission Sought/Granted Permission sought to share information with : Other (comment) Permission granted to share information with : Yes, Verbal Permission Granted Permission granted to share info w AGENCY: HH agencies  Emotional Assessment Appearance:: Appears stated age Attitude/Demeanor/Rapport: Engaged Affect (typically observed): Accepting Orientation: : Oriented to Self,Oriented to Place,Oriented to  Time,Oriented to Situation Alcohol / Substance Use: Not Applicable Psych Involvement: No (comment)  Admission diagnosis:  Peripheral edema [R60.9] Cellulitis in diabetic foot (HCC) [J57.017, L03.119] Bilateral lower extremity edema [R60.0] Cellulitis of lower extremity, unspecified laterality [L03.119] Cellulitis of both lower extremities [L03.115, L03.116] Patient Active Problem List   Diagnosis Date Noted  . Cellulitis of both lower extremities 03/15/2021  . Cellulitis in diabetic foot (HCC) 03/14/2021  . S/P TAVR (transcatheter aortic valve replacement)  05/17/2020  . Syncope 05/11/2020  . Pressure injury of skin 05/11/2020  .  Orthostatic hypotension 05/11/2020  . Dental caries 05/11/2020  . Aortic stenosis   . Diabetic neuropathy (HCC) 11/13/2018  . Left knee pain 10/24/2017  . Greater trochanteric bursitis of left hip 10/24/2017  . Atypical chest pain 10/10/2015  . AKI (acute kidney injury) (HCC) 12/19/2014  . Reactive airway disease 04/09/2014  . Seasonal allergic rhinitis 04/09/2014  . Preventative health care 04/09/2014  . Abnormality of gait 03/17/2013  . Irritable bowel syndrome (IBS) 08/27/2012  . Type II diabetes mellitus with neurological manifestations, 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  . GERD 06/18/2007  . Hyperlipidemia associated with type 2 diabetes mellitus (HCC) 12/27/2006  . Essential hypertension 09/19/2006  . Venous (peripheral) insufficiency 09/19/2006   PCP:  Myrlene Broker, MD Pharmacy:   [facility] [address] [place], [address] [place] [address] Phone: [phone] Fax: [phone]  [facility] [place] [facility] [address] [place], [address], [address] [place] [address] Phone: [phone] Fax: [phone]  Readmission Risk Interventions No flowsheet data found.

## 2021-03-16 NOTE — Progress Notes (Signed)
Pt has been encouraged multiple times to get oob and sit up in chair, pt refuses. Says she just does not feel like it. Explained to her she needs to try to be up moving some. Pt has not wanted to sit up in chair. Legs have been elevated on a pillow.

## 2021-03-17 DIAGNOSIS — E1169 Type 2 diabetes mellitus with other specified complication: Secondary | ICD-10-CM | POA: Diagnosis not present

## 2021-03-17 DIAGNOSIS — E11628 Type 2 diabetes mellitus with other skin complications: Secondary | ICD-10-CM | POA: Diagnosis not present

## 2021-03-17 DIAGNOSIS — R5381 Other malaise: Secondary | ICD-10-CM

## 2021-03-17 DIAGNOSIS — I5033 Acute on chronic diastolic (congestive) heart failure: Secondary | ICD-10-CM

## 2021-03-17 DIAGNOSIS — L03115 Cellulitis of right lower limb: Secondary | ICD-10-CM | POA: Diagnosis not present

## 2021-03-17 DIAGNOSIS — I10 Essential (primary) hypertension: Secondary | ICD-10-CM | POA: Diagnosis not present

## 2021-03-17 DIAGNOSIS — E1165 Type 2 diabetes mellitus with hyperglycemia: Secondary | ICD-10-CM

## 2021-03-17 LAB — BASIC METABOLIC PANEL
Anion gap: 10 (ref 5–15)
BUN: 16 mg/dL (ref 8–23)
CO2: 34 mmol/L — ABNORMAL HIGH (ref 22–32)
Calcium: 8.6 mg/dL — ABNORMAL LOW (ref 8.9–10.3)
Chloride: 93 mmol/L — ABNORMAL LOW (ref 98–111)
Creatinine, Ser: 0.84 mg/dL (ref 0.44–1.00)
GFR, Estimated: >60 mL/min (ref >60)
Glucose, Bld: 145 mg/dL — ABNORMAL HIGH (ref 70–99)
Potassium: 3.7 mmol/L (ref 3.5–5.1)
Sodium: 137 mmol/L (ref 135–145)

## 2021-03-17 LAB — GLUCOSE, CAPILLARY
Glucose-Capillary: 134 mg/dL — ABNORMAL HIGH (ref 70–99)
Glucose-Capillary: 170 mg/dL — ABNORMAL HIGH (ref 70–99)
Glucose-Capillary: 128 mg/dL — ABNORMAL HIGH (ref 70–99)
Glucose-Capillary: 181 mg/dL — ABNORMAL HIGH (ref 70–99)

## 2021-03-17 NOTE — TOC Progression Note (Signed)
Transition of Care [facility]) - Progression Note    Patient Details  Name: Kymani Laursen MRN: [id number] Date of Birth: 12/20/1945  Transition of [facility]) CM/SW Contact  Armanda Heritage, RN Phone Number: 03/17/2021, 1:48 PM  Clinical Narrative:    [facility] will provide home health PT/OT/RN services.   Expected Discharge Plan: Home w Home Health Services Barriers to Discharge: No Barriers Identified  Expected Discharge Plan and Services Expected Discharge Plan: Home w Home Health Services In-house Referral: Clinical Social Work   Post Acute Care Choice: Home Health Living arrangements for the past 2 months: Single Family Home                 DME Arranged: N/A DME Agency: NA       HH Arranged: PT,OT,RN HH Agency: [facility] Date [facility] Agency Contacted: 03/17/21 Time HH Agency Contacted: [phone] Representative spoke with at [facility] Agency: Kandee Keen   Social Determinants of Health (SDOH) Interventions    Readmission Risk Interventions No flowsheet data found.

## 2021-03-17 NOTE — Progress Notes (Signed)
PROGRESS NOTE  [name] SFK:[id number] DOB: 1946-10-01   PCP: Myrlene Broker, MD  Patient is from: Home.  Lives alone.  DOA: 03/14/2021 LOS: 2  Chief complaints: Leg swelling and wound infection  Brief Narrative / Interim history: 75 year old F with PMH of DM-2, HTN, CAD/CABG, diastolic CHF, chronic venous insufficiency and chronic leg wounds presenting with leg pain, swelling and weeping, and admitted for purulent cellulitis and and wound infection.  Patient failed outpatient treatment with p.o. Bactrim and p.o. Lasix.  Therapy recommended SNF but patient prefers to go home with home health.   Subjective: Seen and examined earlier this morning.  No major events overnight or this morning.  No complaints.  She denies chest pain, dyspnea, GI or UTI symptoms.  Leg swelling improved.  She is eager to go home but has not gotten out of bed yet.  She refused to get out of the bed yesterday but willing to try today.  She also does not want to go to SNF.  She has no family members close by but states her neighbors are always in her house to check on her.  Objective: Vitals:   03/16/21 2149 03/17/21 0552 03/17/21 0621 03/17/21 0929  BP: (!) 147/70 125/68  119/66  Pulse: 85 79 78 76  Resp: 16 16  16   Temp: 99.4 F (37.4 C) 98.7 F (37.1 C)  98 F (36.7 C)  TempSrc: Oral Oral  Oral  SpO2: 91% (!) 87%  94%  Weight:      Height:        Intake/Output Summary (Last 24 hours) at 03/17/2021 1331 Last data filed at 03/17/2021 1100 Gross per 24 hour  Intake 730 ml  Output 450 ml  Net 280 ml   Filed Weights   03/14/21 1352  Weight: 87.5 kg    Examination:  GENERAL: No apparent distress.  Nontoxic. HEENT: MMM.  Vision and hearing grossly intact.  NECK: Supple.  No apparent JVD.  RESP:  No IWOB.  Fair aeration bilaterally. CVS:  RRR. Heart sounds normal.  ABD/GI/GU: BS+. Abd soft, NTND.  MSK/EXT:  Moves extremities.  Unna/Ace wrap over BLE. SKIN: Unna/Ace wrap over  BLE NEURO: Awake, alert and oriented appropriately.  No apparent focal neuro deficit. PSYCH: Calm. Normal affect.  Procedures:  None  Microbiology summarized: COVID-19 and influenza PCR nonreactive. Blood cultures NGTD.  Assessment & Plan: Bilateral lower extremity cellulitis, purulent with poorly controlled diabetes mellitus: Bilateral lower extremity edema Chronic venous insufficiency with chronic lower extremity wound Acute on chronic diastolic CHF: TTE in 06/2020 with LVEF of 60 to 65% and G2 DD -Failed outpatient treatment with Bactrim and p.o. Lasix.  -Treated with IV vancomycin and cefepime and transition to p.o. doxycycline -Therapy recommended SNF but patient prefers to go home with home health.  -Appreciate help by wound care-recommended topical treatment with dressing change TTS -Continue IV Lasix today.  Will discharge on torsemide and instead of home Lasix. -Monitor intake and output, renal functions and electrolytes. -OOB/PT/OT  Uncontrolled DM-2 with PVD, HLD, neuropathy and other complications: A1c 12.6%. Recent Labs  Lab 03/16/21 1218 03/16/21 1528 03/16/21 1802 03/16/21 2148 03/17/21 0716  GLUCAP 247* 128* 150* 185* 134*  -Continue current insulin regimen, statin and gabapentin.  Essential hypertension: Normotensive -Continue metoprolol and Lasix  Hyperlipidemia associated with type 2 diabetes mellitus -Continue statin  History of CAD/CABG: Recent LHC in 04/2020 with nonobstructive CAD. -Continue metoprolol, aspirin and a statin.  History of TAVR/bioprosthetic aortic valve-stable.  Debility/physical  deconditioning: Has been refusing to get out of chair but willing to try today.  -See problem #1  Class I obesity Body mass index is 33.13 kg/m.  -Could benefit from GLP-1 inhibitors given diabetes.        DVT prophylaxis:  enoxaparin (LOVENOX) injection 40 mg Start: 03/14/21 1800  Code Status: Full code Family Communication: Patient and/or  RN.  Patient has no family members to notify. Level of care: Med-Surg Status is: Inpatient  Remains inpatient appropriate because:Unsafe d/c plan, IV treatments appropriate due to intensity of illness or inability to take PO and Inpatient level of care appropriate due to severity of illness   Dispo: The patient is from: Home              Anticipated d/c is to: Home likely on 4/10              Patient currently is not medically stable to d/c.   Difficult to place patient No       Consultants:  None   Sch Meds:  Scheduled Meds: . aspirin EC  81 mg Oral Daily  . atorvastatin  80 mg Oral Daily  . citalopram  20 mg Oral Daily  . doxycycline  100 mg Oral Q12H  . enoxaparin (LOVENOX) injection  40 mg Subcutaneous Q24H  . ezetimibe  10 mg Oral Daily  . furosemide  40 mg Intravenous BID  . gabapentin  300 mg Oral TID  . insulin aspart  0-20 Units Subcutaneous TID WC  . insulin aspart  0-5 Units Subcutaneous QHS  . insulin aspart  4 Units Subcutaneous TID WC  . insulin glargine  25 Units Subcutaneous BID  . metoprolol tartrate  12.5 mg Oral BID  . pantoprazole  40 mg Oral Daily   Continuous Infusions: . sodium chloride 10 mL/hr at 03/14/21 1838   PRN Meds:.sodium chloride, acetaminophen **OR** acetaminophen, ondansetron (ZOFRAN) IV  Antimicrobials: Anti-infectives (From admission, onward)   Start     Dose/Rate Route Frequency Ordered Stop   03/16/21 1200  doxycycline (VIBRA-TABS) tablet 100 mg        100 mg Oral Every 12 hours 03/16/21 0926     03/16/21 1015  sulfamethoxazole-trimethoprim (BACTRIM DS) 800-160 MG per tablet 1 tablet  Status:  Discontinued        1 tablet Oral Every 12 hours 03/16/21 0925 03/16/21 0926   03/15/21 1000  vancomycin (VANCOREADY) IVPB 1500 mg/300 mL  Status:  Discontinued        1,500 mg 150 mL/hr over 120 Minutes Intravenous Every 24 hours 03/14/21 1546 03/16/21 0925   03/15/21 0900  cefTRIAXone (ROCEPHIN) 1 g in sodium chloride 0.9 % 100 mL  IVPB  Status:  Discontinued        1 g 200 mL/hr over 30 Minutes Intravenous Daily 03/15/21 0719 03/16/21 0925   03/14/21 1530  vancomycin (VANCOCIN) IVPB 1000 mg/200 mL premix  Status:  Discontinued        1,000 mg 200 mL/hr over 60 Minutes Intravenous  Once 03/14/21 1522 03/14/21 1527   03/14/21 1430  piperacillin-tazobactam (ZOSYN) IVPB 3.375 g        3.375 g 100 mL/hr over 30 Minutes Intravenous  Once 03/14/21 1425 03/14/21 1540   03/14/21 1430  vancomycin (VANCOCIN) IVPB 1000 mg/200 mL premix        1,000 mg 200 mL/hr over 60 Minutes Intravenous  Once 03/14/21 1425 03/14/21 1642       I have personally reviewed the following  labs and images: CBC: Recent Labs  Lab 03/14/21 1333 03/15/21 0305  WBC 8.2 9.9  NEUTROABS 5.6  --   HGB 14.6 12.7  HCT 45.9 39.2  MCV 84.2 84.1  PLT 231 201   BMP &GFR Recent Labs  Lab 03/14/21 1333 03/15/21 0305 03/16/21 0302 03/17/21 0300  NA 138 135  --  137  K 4.3 4.1  --  3.7  CL 100 95*  --  93*  CO2 30 32  --  34*  GLUCOSE 330* 196*  --  145*  BUN 15 15  --  16  CREATININE 0.83 0.75 0.91 0.84  CALCIUM 9.5 8.7*  --  8.6*   Estimated Creatinine Clearance: 62.9 mL/min (by C-G formula based on SCr of 0.84 mg/dL). Liver & Pancreas: Recent Labs  Lab 03/14/21 1333  AST 16  ALT 20  ALKPHOS 180*  BILITOT 0.3  PROT 8.4*  ALBUMIN 3.8   No results for input(s): LIPASE, AMYLASE in the last 168 hours. No results for input(s): AMMONIA in the last 168 hours. Diabetic: Recent Labs    03/14/21 1706  HGBA1C 12.6*   Recent Labs  Lab 03/16/21 1218 03/16/21 1528 03/16/21 1802 03/16/21 2148 03/17/21 0716  GLUCAP 247* 128* 150* 185* 134*   Cardiac Enzymes: No results for input(s): CKTOTAL, CKMB, CKMBINDEX, TROPONINI in the last 168 hours. Recent Labs    06/15/20 1528 02/22/21 1536  PROBNP 308* 55.0   Coagulation Profile: No results for input(s): INR, PROTIME in the last 168 hours. Thyroid Function Tests: No results for  input(s): TSH, T4TOTAL, FREET4, T3FREE, THYROIDAB in the last 72 hours. Lipid Profile: No results for input(s): CHOL, HDL, LDLCALC, TRIG, CHOLHDL, LDLDIRECT in the last 72 hours. Anemia Panel: No results for input(s): VITAMINB12, FOLATE, FERRITIN, TIBC, IRON, RETICCTPCT in the last 72 hours. Urine analysis:    Component Value Date/Time   COLORURINE YELLOW 05/17/2020 0410   APPEARANCEUR CLEAR 05/17/2020 0410   LABSPEC 1.011 05/17/2020 0410   PHURINE 6.0 05/17/2020 0410   GLUCOSEU 150 (A) 05/17/2020 0410   HGBUR SMALL (A) 05/17/2020 0410   BILIRUBINUR NEGATIVE 05/17/2020 0410   BILIRUBINUR Neg 05/10/2019 1029   KETONESUR NEGATIVE 05/17/2020 0410   PROTEINUR NEGATIVE 05/17/2020 0410   UROBILINOGEN 0.2 05/10/2019 1029   UROBILINOGEN 0.2 12/19/2014 1425   NITRITE NEGATIVE 05/17/2020 0410   LEUKOCYTESUR NEGATIVE 05/17/2020 0410   Sepsis Labs: Invalid input(s): PROCALCITONIN, LACTICIDVEN  Microbiology: Recent Results (from the past 240 hour(s))  Culture, blood (routine x 2)     Status: None (Preliminary result)   Collection Time: 03/14/21  1:33 PM   Specimen: BLOOD  Result Value Ref Range Status   Specimen Description   Final    BLOOD LEFT ANTECUBITAL Performed at [facility], [address], [place], [place] [address]    Special Requests   Final    BOTTLES DRAWN AEROBIC AND ANAEROBIC Blood Culture results may not be optimal due to an inadequate volume of blood received in culture bottles Performed at [facility], [address] [place], [place] [address]    Culture   Final    NO GROWTH 3 DAYS Performed at [facility], [address] [place], [place] [address]    Report Status PENDING  Incomplete  Culture, blood (routine x 2)     Status: None (Preliminary result)   Collection Time: 03/14/21  1:33 PM   Specimen: BLOOD  Result Value Ref Range Status   Specimen Description  Final    BLOOD RIGHT ANTECUBITAL Performed at [facility], [address] [place], [place] [address]    Special Requests   Final    BOTTLES DRAWN AEROBIC AND ANAEROBIC Blood Culture results may not be optimal due to an inadequate volume of blood received in culture bottles Performed at [facility], [address] [place], [place] [address]    Culture   Final    NO GROWTH 3 DAYS Performed at [facility], [address] [place], [place] [address]    Report Status PENDING  Incomplete  Resp Panel by RT-PCR (Flu A&B, Covid) Nasopharyngeal Swab     Status: None   Collection Time: 03/14/21  2:26 PM   Specimen: Nasopharyngeal Swab; Nasopharyngeal(NP) swabs in vial transport medium  Result Value Ref Range Status   SARS Coronavirus 2 by RT PCR NEGATIVE NEGATIVE Final    Comment: (NOTE) SARS-CoV-2 target nucleic acids are NOT DETECTED.  The SARS-CoV-2 RNA is generally detectable in upper respiratory specimens during the acute phase of infection. The lowest concentration of SARS-CoV-2 viral copies this assay can detect is 138 copies/mL. A negative result does not preclude SARS-Cov-2 infection and should not be used as the sole basis for treatment or other patient management decisions. A negative result may occur with  improper specimen collection/handling, submission of specimen other than nasopharyngeal swab, presence of viral mutation(s) within the areas targeted by this assay, and inadequate number of viral copies(<138 copies/mL). A negative result must be combined with clinical observations, patient history, and epidemiological information. The expected result is Negative.  Fact Sheet for Patients:  BloggerCourse.com  Fact Sheet for Healthcare Providers:  SeriousBroker.it  This test is no t yet approved or cleared by the [place] FDA and  has been authorized for detection and/or diagnosis of SARS-CoV-2 by FDA under an Emergency Use  Authorization (EUA). This EUA will remain  in effect (meaning this test can be used) for the duration of the COVID-19 declaration under Section 564(b)(1) of the Act, 21 U.S.C.section 360bbb-3(b)(1), unless the authorization is terminated  or revoked sooner.       Influenza A by PCR NEGATIVE NEGATIVE Final   Influenza B by PCR NEGATIVE NEGATIVE Final    Comment: (NOTE) The Xpert Xpress SARS-CoV-2/FLU/RSV plus assay is intended as an aid in the diagnosis of influenza from Nasopharyngeal swab specimens and should not be used as a sole basis for treatment. Nasal washings and aspirates are unacceptable for Xpert Xpress SARS-CoV-2/FLU/RSV testing.  Fact Sheet for Patients: BloggerCourse.com  Fact Sheet for Healthcare Providers: SeriousBroker.it  This test is not yet approved or cleared by the [place] FDA and has been authorized for detection and/or diagnosis of SARS-CoV-2 by FDA under an Emergency Use Authorization (EUA). This EUA will remain in effect (meaning this test can be used) for the duration of the COVID-19 declaration under Section 564(b)(1) of the Act, 21 U.S.C. section 360bbb-3(b)(1), unless the authorization is terminated or revoked.  Performed at [facility], [address], [place], [place] [address]     Radiology Studies: No results found.    Veryl Winemiller T. Pihu [facility]  If 7PM-7AM, please contact night-coverage [email or web address] 03/17/2021, 1:31 PM

## 2021-03-17 NOTE — Progress Notes (Signed)
Dressing change to bilateral lower legs.  Per order:  xeroform, abd pads, kerlex, and ace wrap.

## 2021-03-18 DIAGNOSIS — E876 Hypokalemia: Secondary | ICD-10-CM

## 2021-03-18 DIAGNOSIS — Z952 Presence of prosthetic heart valve: Secondary | ICD-10-CM | POA: Diagnosis not present

## 2021-03-18 DIAGNOSIS — L03115 Cellulitis of right lower limb: Secondary | ICD-10-CM | POA: Diagnosis not present

## 2021-03-18 DIAGNOSIS — E11628 Type 2 diabetes mellitus with other skin complications: Secondary | ICD-10-CM | POA: Diagnosis not present

## 2021-03-18 DIAGNOSIS — I10 Essential (primary) hypertension: Secondary | ICD-10-CM | POA: Diagnosis not present

## 2021-03-18 LAB — RENAL FUNCTION PANEL
Albumin: 2.7 g/dL — ABNORMAL LOW (ref 3.5–5.0)
Albumin: 3 g/dL — ABNORMAL LOW (ref 3.5–5.0)
Anion gap: 9 (ref 5–15)
Anion gap: 8 (ref 5–15)
BUN: 29 mg/dL — ABNORMAL HIGH (ref 8–23)
BUN: 27 mg/dL — ABNORMAL HIGH (ref 8–23)
CO2: 30 mmol/L (ref 22–32)
CO2: 34 mmol/L — ABNORMAL HIGH (ref 22–32)
Calcium: 8.1 mg/dL — ABNORMAL LOW (ref 8.9–10.3)
Calcium: 8.4 mg/dL — ABNORMAL LOW (ref 8.9–10.3)
Chloride: 96 mmol/L — ABNORMAL LOW (ref 98–111)
Chloride: 94 mmol/L — ABNORMAL LOW (ref 98–111)
Creatinine, Ser: 0.7 mg/dL (ref 0.44–1.00)
Creatinine, Ser: 0.76 mg/dL (ref 0.44–1.00)
GFR, Estimated: >60 mL/min (ref >60)
GFR, Estimated: >60 mL/min (ref >60)
Glucose, Bld: 189 mg/dL — ABNORMAL HIGH (ref 70–99)
Glucose, Bld: 151 mg/dL — ABNORMAL HIGH (ref 70–99)
Phosphorus: 3.5 mg/dL (ref 2.5–4.6)
Phosphorus: 3.8 mg/dL (ref 2.5–4.6)
Potassium: 3.2 mmol/L — ABNORMAL LOW (ref 3.5–5.1)
Potassium: 4 mmol/L (ref 3.5–5.1)
Sodium: 135 mmol/L (ref 135–145)
Sodium: 136 mmol/L (ref 135–145)

## 2021-03-18 LAB — MAGNESIUM
Magnesium: 1.4 mg/dL — ABNORMAL LOW (ref 1.7–2.4)
Magnesium: 2.8 mg/dL — ABNORMAL HIGH (ref 1.7–2.4)

## 2021-03-18 LAB — GLUCOSE, CAPILLARY
Glucose-Capillary: 192 mg/dL — ABNORMAL HIGH (ref 70–99)
Glucose-Capillary: 149 mg/dL — ABNORMAL HIGH (ref 70–99)

## 2021-03-18 MED ORDER — DOXYCYCLINE HYCLATE 100 MG PO TABS: 100.0000 mg | ORAL_TABLET | Freq: Two times a day (BID) | ORAL | 0 refills | Status: DC

## 2021-03-18 MED ORDER — POTASSIUM CHLORIDE ER 20 MEQ PO TBCR: 10.0000 meq | EXTENDED_RELEASE_TABLET | Freq: Every day | ORAL | 0 refills | Status: DC

## 2021-03-18 MED ORDER — TORSEMIDE 20 MG PO TABS: 20.0000 mg | ORAL_TABLET | Freq: Every day | ORAL | 1 refills | Status: DC

## 2021-03-18 MED ORDER — MAGNESIUM SULFATE 4 GM/100ML IV SOLN
4.0000 g | Freq: Once | INTRAVENOUS | Status: AC
  Administered 2021-03-18: 4 g via INTRAVENOUS
  Filled 2021-03-18: qty 100

## 2021-03-18 MED ORDER — ATORVASTATIN CALCIUM 40 MG PO TABS: 40.0000 mg | ORAL_TABLET | Freq: Every day | ORAL | 1 refills | Status: DC

## 2021-03-18 MED ORDER — POTASSIUM CHLORIDE CRYS ER 20 MEQ PO TBCR
40.0000 meq | EXTENDED_RELEASE_TABLET | ORAL | Status: AC
  Administered 2021-03-18 (×2): 40 meq via ORAL
  Filled 2021-03-18 (×2): qty 2

## 2021-03-18 NOTE — Plan of Care (Signed)
  Problem: Skin Integrity: Goal: Risk for impaired skin integrity will decrease Outcome: Progressing   Problem: Pain Managment: Goal: General experience of comfort will improve Outcome: Progressing   

## 2021-03-18 NOTE — Discharge Summary (Signed)
Physician Discharge Summary  [name] OMV:[id number] DOB: [birth year] DOA: 03/14/2021  PCP: Myrlene Broker, MD  Admit date: 03/14/2021 Discharge date: 03/18/2021  Admitted From: Home Disposition: Home  Recommendations for Outpatient Follow-up:  1. Follow ups as below. 2. Please obtain CBC/BMP/Mag at follow up 3. Please follow up on the following pending results: None  Home Health: PT/OT/RN Equipment/Devices: None  Discharge Condition: Stable CODE STATUS: Full code   Follow-up Information    Care, [facility] Follow up.   Specialty: Home Health Services Why: agency will provide home health therapy and nurse. Contact information: [address] [place] [address] [phone]        Myrlene Broker, MD. Schedule an appointment as soon as possible for a visit in 1 week(s).   Specialty: Internal Medicine Contact information: [address] [place] [address] [phone]        Chilton Si, MD. Schedule an appointment as soon as possible for a visit in 2 week(s).   Specialty: Cardiology Contact information: [address] [place] [place] [address] [phone]              Hospital Course: 75 year old F with PMH of DM-2, HTN, CAD/CABG, diastolic CHF, chronic venous insufficiency and chronic leg wounds presenting with leg pain, swelling and weeping, and admitted for purulent cellulitis and and wound infection.  Patient failed outpatient treatment with p.o. Bactrim and p.o. Lasix.  Patient was treated with IV vancomycin and cefepime.  Eventually, transition to p.o. doxycycline.  Evaluated by wound care team who gave recommendation about wound dressing.  She was diuresed with IV Lasix with improvement in his swelling and edema.  She is discharged on p.o. torsemide 20 mg twice daily and K-Dur 20 mEq daily.  Patient was ambulated and maintain saturation above 91% on room air.   Therapy recommended SNF  but patient refused SNF and decided to go home with home health.  Home health RN/PT/OT ordered.  Patient has capacity to make medical decision.   See individual problem list below for more on hospital course.  Discharge Diagnoses:  Bilateral lower extremity cellulitis, purulent with poorly controlled diabetes mellitus: Bilateral lower extremity edema Chronic venous insufficiency with chronic lower extremity wound Acute on chronic diastolic CHF: TTE in 06/2020 with LVEF of 60 to 65% and G2 DD -Failed outpatient treatment with Bactrim and p.o. Lasix.  -Treated with IV vancomycin and cefepime and transitioned to p.o. doxycycline.  -Discharged on p.o. doxycycline 100 mg twice daily for 7 more days -Discharge on torsemide 20 mg twice daily -Home health RN/PT/OT -Appreciate help by wound care-recommended topical treatment with dressing change TTS as below -Reassess in 1 to 2 weeks.  Check fluid status, renal function and magnesium at follow-up.  Uncontrolled DM-2 with PVD, HLD, neuropathy and other complications: A1c 12.6%.  Likely noncompliant Recent Labs  Lab 03/17/21 1401 03/17/21 1706 03/17/21 2100 03/18/21 0737 03/18/21 1237  GLUCAP 170* 128* 181* 192* 149*  -Discharged on home regimen including 70/3045 units twice daily, Metformin, atorvastatin and gabapentin -Encourage lifestyle change as well -Home health RN ordered  Essential hypertension: Normotensive -Continue home metoprolol -Change in home Lasix to torsemide  Hyperlipidemia associated with type 2 diabetes mellitus -Continue statin  History of CAD/CABG: Recent LHC in 04/2020 with nonobstructive CAD. -Continue metoprolol, aspirin and a statin.  History of TAVR/bioprosthetic aortic valve-stable.  Hypokalemia/hypomagnesemia: Replenished and resolved. -Discharged on p.o. K. Dur 20 mEq daily while on torsemide  Debility/physical deconditioning:  Improved. -HH PT/OT ordered on discharge.  Class I obesity Body  mass index is 33.13 kg/m.  -Encourage lifestyle change to lose weight.       Pressure Injury 05/11/20 Coccyx Medial Stage 2 -  Partial thickness loss of dermis presenting as a shallow open injury with a red, pink wound bed without slough. Shallow open area between buttocks (Active)  05/11/20 1500  Location: Coccyx  Location Orientation: Medial  Staging: Stage 2 -  Partial thickness loss of dermis presenting as a shallow open injury with a red, pink wound bed without slough.  Wound Description (Comments): Shallow open area between buttocks  Present on Admission: Yes    Discharge Exam: Vitals:   03/18/21 1038 03/18/21 1402  BP: (!) 109/54 (!) 113/52  Pulse: 72 72  Resp: 16 17  Temp: 98.4 F (36.9 C) 98.3 F (36.8 C)  SpO2: (!) 87% 96%    GENERAL: No apparent distress.  Nontoxic. HEENT: MMM.  Vision and hearing grossly intact.  NECK: Supple.  No apparent JVD.  RESP: On RA.  No IWOB.  Fair aeration bilaterally. CVS:  RRR. Heart sounds normal.  ABD/GI/GU: Bowel sounds present. Soft. Non tender.  MSK/EXT:  Moves extremities.  Dressing with Ace wraps over BLE. SKIN: Dressing with Ace wrap over BLE. NEURO: Awake, alert and oriented appropriately.  No apparent focal neuro deficit. PSYCH: Calm. Normal affect.   Discharge Instructions  Discharge Instructions    (HEART FAILURE PATIENTS) Call MD:  Anytime you have any of the following symptoms: 1) 3 pound weight gain in 24 hours or 5 pounds in 1 week 2) shortness of breath, with or without a dry hacking cough 3) swelling in the hands, feet or stomach 4) if you have to sleep on extra pillows at night in order to breathe.   Complete by: As directed    Call MD for:  difficulty breathing, headache or visual disturbances   Complete by: As directed    Call MD for:  extreme fatigue   Complete by: As directed    Call MD for:  persistant dizziness or light-headedness   Complete by: As directed    Call MD for:  persistant nausea and  vomiting   Complete by: As directed    Call MD for:  redness, tenderness, or signs of infection (pain, swelling, redness, odor or green/yellow discharge around incision site)   Complete by: As directed    Call MD for:  severe uncontrolled pain   Complete by: As directed    Call MD for:  temperature >100.4   Complete by: As directed    Diet - low sodium heart healthy   Complete by: As directed    Discharge instructions   Complete by: As directed    It has been a pleasure taking care of you!  You were hospitalized due to leg swelling, redness and pain likely due to infection and fluid accumulation.  You have been treated with antibiotics and intravenous Lasix.  With that, your symptoms improved.  We are discharging you on torsemide instead of Lasix for fluid.  We also recommend continuing doxycycline for the next 7 days to complete treatment course.  Please review your new medication list and the directions on your medications before you take them.  Follow-up with your primary care doctor in 1 to 2 weeks.  In addition to taking your medications as prescribed, we also recommend you avoid alcohol or over-the-counter pain medication other than plain Tylenol, limit the amount of  water/fluid you drink to less than 6 cups (1500 cc) a day,  limit your sodium (salt) intake to less than 2 g (2000 mg) a day and weigh yourself daily at the same time and keeping your weight log.     Take care,   Discharge wound care:   Complete by: As directed    Change dressings to bilat legs Q Tues/Thurs/Sat as follows: apply xeroform and abd pads to draining areas, then cover with kerlex, beginning just behind toes to below knees in a spiral fashion, then ace wrap in the same manner   Increase activity slowly   Complete by: As directed      Allergies as of 03/18/2021      Reactions   Morphine Other (See Comments)   'took me out of this world' I had to be resuscitated   Codeine Sulfate Other (See Comments)   GI  upset and pain   Demerol [meperidine] Nausea And Vomiting   Meperidine Hcl Nausea And Vomiting, Swelling   Propoxyphene Hcl Other (See Comments)   Darvocet caused sick headache      Medication List    STOP taking these medications   furosemide 40 MG tablet Commonly known as: LASIX     TAKE these medications   acetaminophen 325 MG tablet Commonly known as: TYLENOL Take 325 mg by mouth daily as needed for mild pain or headache.   ASPERCREME EX Apply 1 application topically daily as needed (shoulder/hips/knee/knuckles).   aspirin 81 MG tablet Take 81 mg by mouth daily.   atorvastatin 40 MG tablet Commonly known as: Lipitor Take 1 tablet (40 mg total) by mouth daily. What changed:   medication strength  how much to take   citalopram 20 MG tablet Commonly known as: CELEXA TAKE 1 TABLET BY MOUTH  DAILY   doxycycline 100 MG tablet Commonly known as: VIBRA-TABS Take 1 tablet (100 mg total) by mouth every 12 (twelve) hours.   esomeprazole 20 MG capsule Commonly known as: NexIUM Take 1 capsule (20 mg total) by mouth daily at 12 noon.   ezetimibe 10 MG tablet Commonly known as: ZETIA Take 1 tablet (10 mg total) by mouth daily.   FreeStyle Engineer, petroleum Use to monitor sugars   gabapentin 300 MG capsule Commonly known as: NEURONTIN TAKE 1 CAPSULE BY MOUTH 3  TIMES DAILY   insulin NPH-regular Human (70-30) 100 UNIT/ML injection Inject 45 Units into the skin 2 (two) times daily with a meal.   Insulin Pen Needle 31G X 8 MM Misc Use to inject insulin four times daily E11.41   metFORMIN 1000 MG tablet Commonly known as: GLUCOPHAGE TAKE ONE-HALF TABLET BY  MOUTH TWICE DAILY WITH A  MEAL   metoprolol tartrate 25 MG tablet Commonly known as: LOPRESSOR Take 0.5 tablets (12.5 mg total) by mouth 2 (two) times daily.   OneTouch Verio test strip Generic drug: glucose blood Use as instructed   Potassium Chloride ER 20 MEQ Tbcr Take 10 mEq by mouth daily.    Rybelsus 3 MG Tabs Generic drug: Semaglutide Take 3 mg by mouth daily.   sodium chloride 0.65 % Soln nasal spray Commonly known as: OCEAN Place 1 spray into both nostrils 4 (four) times daily. What changed:   when to take this  reasons to take this   tetrahydrozoline 0.05 % ophthalmic solution Place 1-2 drops into both eyes 2 (two) times daily as needed (Dry eyes).   torsemide 20 MG tablet Commonly known as: Demadex Take  1 tablet (20 mg total) by mouth daily.            Discharge Care Instructions  (From admission, onward)         Start     Ordered   03/18/21 0000  Discharge wound care:       Comments: Change dressings to bilat legs Q Tues/Thurs/Sat as follows: apply xeroform and abd pads to draining areas, then cover with kerlex, beginning just behind toes to below knees in a spiral fashion, then ace wrap in the same manner   03/18/21 1052          Consultations:  Wound care nurse  Procedures/Studies:    No results found.     The results of significant diagnostics from this hospitalization (including imaging, microbiology, ancillary and laboratory) are listed below for reference.     Microbiology: Recent Results (from the past 240 hour(s))  Culture, blood (routine x 2)     Status: None (Preliminary result)   Collection Time: 03/14/21  1:33 PM   Specimen: BLOOD  Result Value Ref Range Status   Specimen Description   Final    BLOOD LEFT ANTECUBITAL Performed at [facility], [address] [place], [place] [address]    Special Requests   Final    BOTTLES DRAWN AEROBIC AND ANAEROBIC Blood Culture results may not be optimal due to an inadequate volume of blood received in culture bottles Performed at [facility], [address] [place], [place] [address]    Culture   Final    NO GROWTH 4 DAYS Performed at [facility], [address] [place], [place] [address]    Report Status PENDING  Incomplete   Culture, blood (routine x 2)     Status: None (Preliminary result)   Collection Time: 03/14/21  1:33 PM   Specimen: BLOOD  Result Value Ref Range Status   Specimen Description   Final    BLOOD RIGHT ANTECUBITAL Performed at [facility], [address] [place], [place] [address]    Special Requests   Final    BOTTLES DRAWN AEROBIC AND ANAEROBIC Blood Culture results may not be optimal due to an inadequate volume of blood received in culture bottles Performed at [facility], [address] [place], [place] [address]    Culture   Final    NO GROWTH 4 DAYS Performed at [facility], [address] [place], [place] [address]    Report Status PENDING  Incomplete  Resp Panel by RT-PCR (Flu A&B, Covid) Nasopharyngeal Swab     Status: None   Collection Time: 03/14/21  2:26 PM   Specimen: Nasopharyngeal Swab; Nasopharyngeal(NP) swabs in vial transport medium  Result Value Ref Range Status   SARS Coronavirus 2 by RT PCR NEGATIVE NEGATIVE Final    Comment: (NOTE) SARS-CoV-2 target nucleic acids are NOT DETECTED.  The SARS-CoV-2 RNA is generally detectable in upper respiratory specimens during the acute phase of infection. The lowest concentration of SARS-CoV-2 viral copies this assay can detect is 138 copies/mL. A negative result does not preclude SARS-Cov-2 infection and should not be used as the sole basis for treatment or other patient management decisions. A negative result may occur with  improper specimen collection/handling, submission of specimen other than nasopharyngeal swab, presence of viral mutation(s) within the areas targeted by this assay, and inadequate number of viral copies(<138 copies/mL). A negative result must be combined with clinical observations, patient  history, and epidemiological information. The expected result is Negative.  Fact Sheet for Patients:  BloggerCourse.com  Fact Sheet for  Healthcare Providers:  SeriousBroker.it  This test is no t yet approved or cleared by the [place] FDA and  has been authorized for detection and/or diagnosis of SARS-CoV-2 by FDA under an Emergency Use Authorization (EUA). This EUA will remain  in effect (meaning this test can be used) for the duration of the COVID-19 declaration under Section 564(b)(1) of the Act, 21 U.S.C.section 360bbb-3(b)(1), unless the authorization is terminated  or revoked sooner.       Influenza A by PCR NEGATIVE NEGATIVE Final   Influenza B by PCR NEGATIVE NEGATIVE Final    Comment: (NOTE) The Xpert Xpress SARS-CoV-2/FLU/RSV plus assay is intended as an aid in the diagnosis of influenza from Nasopharyngeal swab specimens and should not be used as a sole basis for treatment. Nasal washings and aspirates are unacceptable for Xpert Xpress SARS-CoV-2/FLU/RSV testing.  Fact Sheet for Patients: BloggerCourse.com  Fact Sheet for Healthcare Providers: SeriousBroker.it  This test is not yet approved or cleared by the [place] FDA and has been authorized for detection and/or diagnosis of SARS-CoV-2 by FDA under an Emergency Use Authorization (EUA). This EUA will remain in effect (meaning this test can be used) for the duration of the COVID-19 declaration under Section 564(b)(1) of the Act, 21 U.S.C. section 360bbb-3(b)(1), unless the authorization is terminated or revoked.  Performed at [facility], [address] [place], [place] [address]      Labs:  CBC: Recent Labs  Lab 03/14/21 1333 03/15/21 0305  WBC 8.2 9.9  NEUTROABS 5.6  --   HGB 14.6 12.7  HCT 45.9 39.2  MCV 84.2 84.1  PLT 231 201   BMP &GFR Recent Labs  Lab 03/14/21 1333 03/15/21 0305 03/16/21 0302 03/17/21 0300 03/18/21 0726  NA 138 135  --  137 135  K 4.3 4.1  --  3.7 3.2*  CL 100 95*  --  93* 96*  CO2 30 32  --  34*  30  GLUCOSE 330* 196*  --  145* 189*  BUN 15 15  --  16 29*  CREATININE 0.83 0.75 0.91 0.84 0.70  CALCIUM 9.5 8.7*  --  8.6* 8.1*  MG  --   --   --   --  1.4*  PHOS  --   --   --   --  3.5   Estimated Creatinine Clearance: 66 mL/min (by C-G formula based on SCr of 0.7 mg/dL). Liver & Pancreas: Recent Labs  Lab 03/14/21 1333 03/18/21 0726  AST 16  --   ALT 20  --   ALKPHOS 180*  --   BILITOT 0.3  --   PROT 8.4*  --   ALBUMIN 3.8 2.7*   No results for input(s): LIPASE, AMYLASE in the last 168 hours. No results for input(s): AMMONIA in the last 168 hours. Diabetic: No results for input(s): HGBA1C in the last 72 hours. Recent Labs  Lab 03/17/21 1401 03/17/21 1706 03/17/21 2100 03/18/21 0737 03/18/21 1237  GLUCAP 170* 128* 181* 192* 149*   Cardiac Enzymes: No results for input(s): CKTOTAL, CKMB, CKMBINDEX, TROPONINI in the last 168 hours. Recent Labs    06/15/20 1528 02/22/21 1536  PROBNP 308* 55.0   Coagulation Profile: No results for input(s): INR, PROTIME in the last 168 hours. Thyroid Function Tests: No results for input(s): TSH, T4TOTAL, FREET4, T3FREE, THYROIDAB in the last 72 hours.  Lipid Profile: No results for input(s): CHOL, HDL, LDLCALC, TRIG, CHOLHDL, LDLDIRECT in the last 72 hours. Anemia Panel: No results for input(s): VITAMINB12, FOLATE, FERRITIN, TIBC, IRON, RETICCTPCT in the last 72 hours. Urine analysis:    Component Value Date/Time   COLORURINE YELLOW 05/17/2020 0410   APPEARANCEUR CLEAR 05/17/2020 0410   LABSPEC 1.011 05/17/2020 0410   PHURINE 6.0 05/17/2020 0410   GLUCOSEU 150 (A) 05/17/2020 0410   HGBUR SMALL (A) 05/17/2020 0410   BILIRUBINUR NEGATIVE 05/17/2020 0410   BILIRUBINUR Neg 05/10/2019 1029   KETONESUR NEGATIVE 05/17/2020 0410   PROTEINUR NEGATIVE 05/17/2020 0410   UROBILINOGEN 0.2 05/10/2019 1029   UROBILINOGEN 0.2 12/19/2014 1425   NITRITE NEGATIVE 05/17/2020 0410   LEUKOCYTESUR NEGATIVE 05/17/2020 0410   Sepsis  Labs: Invalid input(s): PROCALCITONIN, LACTICIDVEN   Time coordinating discharge: 45 minutes  SIGNED:  Almon Hercules, MD  [facility] 03/18/2021, 2:42 PM  If 7PM-7AM, please contact night-coverage [email or web address]

## 2021-03-18 NOTE — Progress Notes (Signed)
Oxygen saturation assessment:  Oxygen saturation at rest on room air: 96-97%. Oxygen saturation on room air while ambulating: maintained at 91%. Patient's oxygen saturation did drop to 77% very briefly while sitting up and down putting clothes on. However, patient was in back pain. Just gave scheduled Gabapentin and PRN Tylenol and encouraged deep breathing. Dr. Candelaria Stagers, MD aware. Received verbal order to discharge patient.

## 2021-03-18 NOTE — Plan of Care (Signed)
  Problem: Health Behavior/Discharge Planning: Goal: Ability to manage health-related needs will improve Outcome: Progressing   

## 2021-03-18 NOTE — Progress Notes (Signed)
Patient complained on burning sensation near her rectum, assisted patient on her side to assess- sacral area pink but blanchable, looks like she may have had a small open area between her buttocks near the top at one time (upon checking chart- open area was documented back in June 2021), but the skin remains intact as of now- it is darker red but blanches- barrier cream applied, large sacral foam applied, and pillows placed under bilateral hips to relieve  Pressure. Patient is able to shift positions on her own and was encouraged to do so frequently, will continue to monitor as area seems very susceptible to opening up again.

## 2021-03-18 NOTE — Progress Notes (Signed)
The patient is alert and oriented and has been seen by her physician. The orders for discharge were written. IV has been removed. Went over discharge instructions with patient. She is being discharged via wheelchair with all of her belongings.   

## 2021-03-19 LAB — CULTURE, BLOOD (ROUTINE X 2)
Culture: NO GROWTH
Culture: NO GROWTH

## 2021-03-20 ENCOUNTER — Telehealth: Payer: Self-pay | Admitting: Internal Medicine

## 2021-03-20 DIAGNOSIS — E1142 Type 2 diabetes mellitus with diabetic polyneuropathy: Secondary | ICD-10-CM | POA: Diagnosis not present

## 2021-03-20 DIAGNOSIS — S81801D Unspecified open wound, right lower leg, subsequent encounter: Secondary | ICD-10-CM | POA: Diagnosis not present

## 2021-03-20 DIAGNOSIS — I110 Hypertensive heart disease with heart failure: Secondary | ICD-10-CM | POA: Diagnosis not present

## 2021-03-20 DIAGNOSIS — L03116 Cellulitis of left lower limb: Secondary | ICD-10-CM | POA: Diagnosis not present

## 2021-03-20 DIAGNOSIS — L03115 Cellulitis of right lower limb: Secondary | ICD-10-CM | POA: Diagnosis not present

## 2021-03-20 NOTE — Telephone Encounter (Signed)
Fine

## 2021-03-20 NOTE — Telephone Encounter (Signed)
[name] from the wound center called   Requesting verbal orders for a boot wrap.  Patient arrived with wrap only and the swelling is severe.   Please advise and call back at [phone]

## 2021-03-20 NOTE — Telephone Encounter (Signed)
See below

## 2021-03-21 ENCOUNTER — Other Ambulatory Visit: Payer: Self-pay

## 2021-03-21 ENCOUNTER — Telehealth: Payer: Self-pay | Admitting: Internal Medicine

## 2021-03-21 DIAGNOSIS — I110 Hypertensive heart disease with heart failure: Secondary | ICD-10-CM | POA: Diagnosis not present

## 2021-03-21 DIAGNOSIS — S81801D Unspecified open wound, right lower leg, subsequent encounter: Secondary | ICD-10-CM | POA: Diagnosis not present

## 2021-03-21 DIAGNOSIS — L03115 Cellulitis of right lower limb: Secondary | ICD-10-CM | POA: Diagnosis not present

## 2021-03-21 DIAGNOSIS — E1142 Type 2 diabetes mellitus with diabetic polyneuropathy: Secondary | ICD-10-CM | POA: Diagnosis not present

## 2021-03-21 DIAGNOSIS — L03116 Cellulitis of left lower limb: Secondary | ICD-10-CM | POA: Diagnosis not present

## 2021-03-21 NOTE — Telephone Encounter (Signed)
[name] is requesting to move home health OT eval to next week per patient requesting. Please advise    Okay to LVM: [phone]

## 2021-03-21 NOTE — Telephone Encounter (Signed)
Spoke with Corrie Dandy to give verbal orders for boot wrap. No other questions or concerns.

## 2021-03-21 NOTE — Telephone Encounter (Signed)
See below

## 2021-03-22 ENCOUNTER — Ambulatory Visit (INDEPENDENT_AMBULATORY_CARE_PROVIDER_SITE_OTHER): Payer: Medicare Other | Admitting: Internal Medicine

## 2021-03-22 ENCOUNTER — Ambulatory Visit: Payer: Medicare Other | Admitting: Internal Medicine

## 2021-03-22 ENCOUNTER — Encounter: Payer: Self-pay | Admitting: Internal Medicine

## 2021-03-22 DIAGNOSIS — E1142 Type 2 diabetes mellitus with diabetic polyneuropathy: Secondary | ICD-10-CM | POA: Diagnosis not present

## 2021-03-22 DIAGNOSIS — E1149 Type 2 diabetes mellitus with other diabetic neurological complication: Secondary | ICD-10-CM | POA: Diagnosis not present

## 2021-03-22 DIAGNOSIS — IMO0002 Reserved for concepts with insufficient information to code with codable children: Secondary | ICD-10-CM

## 2021-03-22 DIAGNOSIS — L03119 Cellulitis of unspecified part of limb: Secondary | ICD-10-CM | POA: Diagnosis not present

## 2021-03-22 DIAGNOSIS — L03115 Cellulitis of right lower limb: Secondary | ICD-10-CM | POA: Diagnosis not present

## 2021-03-22 DIAGNOSIS — E11628 Type 2 diabetes mellitus with other skin complications: Secondary | ICD-10-CM | POA: Diagnosis not present

## 2021-03-22 DIAGNOSIS — N179 Acute kidney failure, unspecified: Secondary | ICD-10-CM

## 2021-03-22 DIAGNOSIS — S81801D Unspecified open wound, right lower leg, subsequent encounter: Secondary | ICD-10-CM | POA: Diagnosis not present

## 2021-03-22 DIAGNOSIS — L03116 Cellulitis of left lower limb: Secondary | ICD-10-CM | POA: Diagnosis not present

## 2021-03-22 DIAGNOSIS — E1165 Type 2 diabetes mellitus with hyperglycemia: Secondary | ICD-10-CM | POA: Diagnosis not present

## 2021-03-22 DIAGNOSIS — I110 Hypertensive heart disease with heart failure: Secondary | ICD-10-CM | POA: Diagnosis not present

## 2021-03-22 MED ORDER — RYBELSUS 7 MG PO TABS: 7.0000 mg | ORAL_TABLET | Freq: Every day | ORAL | 0 refills | Status: DC

## 2021-03-22 NOTE — Telephone Encounter (Signed)
Fine

## 2021-03-22 NOTE — Progress Notes (Signed)
   Subjective:   Patient ID: [name], female    DOB: 1946-02-21, 75 y.o.   MRN: [id number]  HPI The patient is a 75 YO female coming in for hospital follow up (in for cellulitis and severe swelling in the legs, given antibiotics and wrapped legs). She has severely uncontrolled diabetes and started on rybelsus 3 mg daily about 1-2 weeks ago. Has not seen difference with this. Denies fevers or chills. Legs are improving although these are difficult for her to wrap. Going to wound center weekly. Swelling is better but still swelling some. Denies constipation. Metformin gives her diarrhea and she has cut back on this but wants to keep taking as she is hoping to have better control. HgA1c in the hospital 12.6 which is consistent with her prior.   PMH, [facility], social history reviewed and updated  Review of Systems  Constitutional: Positive for activity change and fatigue.  HENT: Negative.   Eyes: Negative.   Respiratory: Negative for cough, chest tightness and shortness of breath.   Cardiovascular: Positive for leg swelling. Negative for chest pain and palpitations.  Gastrointestinal: Positive for diarrhea. Negative for abdominal distention, abdominal pain, constipation, nausea and vomiting.  Musculoskeletal: Negative.   Skin: Negative.   Neurological: Negative.   Psychiatric/Behavioral: Negative.     Objective:  Physical Exam Constitutional:      Appearance: She is well-developed.  HENT:     Head: Normocephalic and atraumatic.  Cardiovascular:     Rate and Rhythm: Normal rate and regular rhythm.  Pulmonary:     Effort: Pulmonary effort is normal. No respiratory distress.     Breath sounds: Normal breath sounds. No wheezing or rales.  Abdominal:     General: Bowel sounds are normal. There is no distension.     Palpations: Abdomen is soft.     Tenderness: There is no abdominal tenderness. There is no rebound.  Musculoskeletal:        General: Tenderness present.     Cervical  back: Normal range of motion.     Right lower leg: Edema present.     Left lower leg: Edema present.     Comments: Swelling improved from prior  Skin:    General: Skin is warm and dry.  Neurological:     Mental Status: She is alert and oriented to person, place, and time.     Coordination: Coordination normal.     Vitals:   03/22/21 1022  BP: 126/86  Pulse: 60  Resp: 18  Temp: 98.1 F (36.7 C)  TempSrc: Oral  SpO2: 96%  Weight: 163 lb 9.6 oz (74.2 kg)  Height: 5\' 4"  (1.626 m)    This visit occurred during the SARS-CoV-2 public health emergency.  Safety protocols were in place, including screening questions prior to the visit, additional usage of staff PPE, and extensive cleaning of exam room while observing appropriate contact time as indicated for disinfecting solutions.   Assessment & Plan:

## 2021-03-22 NOTE — Assessment & Plan Note (Signed)
Stable on discharge and no indication for repeat labs today.

## 2021-03-22 NOTE — Patient Instructions (Signed)
We will increase the dose of the rybelsus so when you get the refill it will be 7 mg instead of 3 mg.

## 2021-03-22 NOTE — Assessment & Plan Note (Signed)
Overall stable. Taking gabapentin 300 mg TID.

## 2021-03-22 NOTE — Telephone Encounter (Signed)
Spoke with michelle to give verbal orders. No other questions or concerns at this time

## 2021-03-22 NOTE — Assessment & Plan Note (Signed)
Overall improving and she will continue doxycycline for another 3 days.

## 2021-03-22 NOTE — Assessment & Plan Note (Signed)
Will increase rybelsus to 7 mg daily with next fill and then repeat HgA1c 2-3 months. Continue metformin and insulin (fill pattern not consistent with taking appropriately but she is working on it).

## 2021-03-26 ENCOUNTER — Encounter (HOSPITAL_BASED_OUTPATIENT_CLINIC_OR_DEPARTMENT_OTHER): Payer: Medicare Other | Admitting: Internal Medicine

## 2021-03-27 DIAGNOSIS — L03116 Cellulitis of left lower limb: Secondary | ICD-10-CM | POA: Diagnosis not present

## 2021-03-27 DIAGNOSIS — I110 Hypertensive heart disease with heart failure: Secondary | ICD-10-CM | POA: Diagnosis not present

## 2021-03-27 DIAGNOSIS — L03115 Cellulitis of right lower limb: Secondary | ICD-10-CM | POA: Diagnosis not present

## 2021-03-27 DIAGNOSIS — S81801D Unspecified open wound, right lower leg, subsequent encounter: Secondary | ICD-10-CM | POA: Diagnosis not present

## 2021-03-27 DIAGNOSIS — E1142 Type 2 diabetes mellitus with diabetic polyneuropathy: Secondary | ICD-10-CM | POA: Diagnosis not present

## 2021-03-28 ENCOUNTER — Telehealth: Payer: Self-pay | Admitting: Internal Medicine

## 2021-03-28 NOTE — Telephone Encounter (Signed)
Fyi.

## 2021-03-28 NOTE — Telephone Encounter (Signed)
Crystal from [facility] calling, went to go see patient yesterday and the patient has not been checking her blood sugar and her blood sugar was 314 yesterday and she stated she had not ate. Patient also has the freestyle libre.  Patient has 2+ edema on bilateral extremities. She wrapped both legs with compression wraps. Also the wound on the right leg has healed.   Crystal- [phone]

## 2021-03-28 NOTE — Telephone Encounter (Signed)
Noted  

## 2021-03-29 ENCOUNTER — Telehealth: Payer: Self-pay | Admitting: Internal Medicine

## 2021-03-29 DIAGNOSIS — L03116 Cellulitis of left lower limb: Secondary | ICD-10-CM | POA: Diagnosis not present

## 2021-03-29 DIAGNOSIS — E1142 Type 2 diabetes mellitus with diabetic polyneuropathy: Secondary | ICD-10-CM | POA: Diagnosis not present

## 2021-03-29 DIAGNOSIS — S81801D Unspecified open wound, right lower leg, subsequent encounter: Secondary | ICD-10-CM | POA: Diagnosis not present

## 2021-03-29 DIAGNOSIS — I110 Hypertensive heart disease with heart failure: Secondary | ICD-10-CM | POA: Diagnosis not present

## 2021-03-29 DIAGNOSIS — L03115 Cellulitis of right lower limb: Secondary | ICD-10-CM | POA: Diagnosis not present

## 2021-03-29 NOTE — Telephone Encounter (Signed)
[name] is requesting verbals for OT for 1w5 to focus on balance and energy conservation with ADL's. Please advise     Okay to LVM: [phone]

## 2021-03-29 NOTE — Telephone Encounter (Signed)
See below

## 2021-03-30 DIAGNOSIS — L03116 Cellulitis of left lower limb: Secondary | ICD-10-CM | POA: Diagnosis not present

## 2021-03-30 DIAGNOSIS — E1142 Type 2 diabetes mellitus with diabetic polyneuropathy: Secondary | ICD-10-CM | POA: Diagnosis not present

## 2021-03-30 DIAGNOSIS — S81801D Unspecified open wound, right lower leg, subsequent encounter: Secondary | ICD-10-CM | POA: Diagnosis not present

## 2021-03-30 DIAGNOSIS — L03115 Cellulitis of right lower limb: Secondary | ICD-10-CM | POA: Diagnosis not present

## 2021-03-30 DIAGNOSIS — I110 Hypertensive heart disease with heart failure: Secondary | ICD-10-CM | POA: Diagnosis not present

## 2021-03-30 NOTE — Telephone Encounter (Signed)
Noted, can offer patient visit if desired.

## 2021-03-30 NOTE — Telephone Encounter (Signed)
Spoke with Marcelino Duster to give verbal orders. No other questions or concerns.

## 2021-03-30 NOTE — Telephone Encounter (Signed)
See below

## 2021-03-30 NOTE — Telephone Encounter (Signed)
Fine for verbals °

## 2021-03-30 NOTE — Telephone Encounter (Signed)
Crystal calling back, had a visit with the patient today and her weight is up by 6 pounds and she is still not checking her blood sugar

## 2021-04-03 ENCOUNTER — Other Ambulatory Visit: Payer: Self-pay | Admitting: Physician Assistant

## 2021-04-03 DIAGNOSIS — L03116 Cellulitis of left lower limb: Secondary | ICD-10-CM | POA: Diagnosis not present

## 2021-04-03 DIAGNOSIS — L03115 Cellulitis of right lower limb: Secondary | ICD-10-CM | POA: Diagnosis not present

## 2021-04-03 DIAGNOSIS — S81801D Unspecified open wound, right lower leg, subsequent encounter: Secondary | ICD-10-CM | POA: Diagnosis not present

## 2021-04-03 DIAGNOSIS — E1142 Type 2 diabetes mellitus with diabetic polyneuropathy: Secondary | ICD-10-CM | POA: Diagnosis not present

## 2021-04-03 DIAGNOSIS — Z952 Presence of prosthetic heart valve: Secondary | ICD-10-CM

## 2021-04-03 DIAGNOSIS — I110 Hypertensive heart disease with heart failure: Secondary | ICD-10-CM | POA: Diagnosis not present

## 2021-04-06 DIAGNOSIS — S81801D Unspecified open wound, right lower leg, subsequent encounter: Secondary | ICD-10-CM | POA: Diagnosis not present

## 2021-04-06 DIAGNOSIS — E1142 Type 2 diabetes mellitus with diabetic polyneuropathy: Secondary | ICD-10-CM | POA: Diagnosis not present

## 2021-04-06 DIAGNOSIS — L03115 Cellulitis of right lower limb: Secondary | ICD-10-CM | POA: Diagnosis not present

## 2021-04-06 DIAGNOSIS — I110 Hypertensive heart disease with heart failure: Secondary | ICD-10-CM | POA: Diagnosis not present

## 2021-04-06 DIAGNOSIS — L03116 Cellulitis of left lower limb: Secondary | ICD-10-CM | POA: Diagnosis not present

## 2021-04-11 ENCOUNTER — Other Ambulatory Visit: Payer: Self-pay | Admitting: Internal Medicine

## 2021-04-11 DIAGNOSIS — S81801D Unspecified open wound, right lower leg, subsequent encounter: Secondary | ICD-10-CM | POA: Diagnosis not present

## 2021-04-11 DIAGNOSIS — E1149 Type 2 diabetes mellitus with other diabetic neurological complication: Secondary | ICD-10-CM

## 2021-04-11 DIAGNOSIS — E1142 Type 2 diabetes mellitus with diabetic polyneuropathy: Secondary | ICD-10-CM | POA: Diagnosis not present

## 2021-04-11 DIAGNOSIS — IMO0002 Reserved for concepts with insufficient information to code with codable children: Secondary | ICD-10-CM

## 2021-04-11 DIAGNOSIS — L03115 Cellulitis of right lower limb: Secondary | ICD-10-CM | POA: Diagnosis not present

## 2021-04-11 DIAGNOSIS — I110 Hypertensive heart disease with heart failure: Secondary | ICD-10-CM | POA: Diagnosis not present

## 2021-04-11 DIAGNOSIS — L03116 Cellulitis of left lower limb: Secondary | ICD-10-CM | POA: Diagnosis not present

## 2021-04-12 DIAGNOSIS — E1142 Type 2 diabetes mellitus with diabetic polyneuropathy: Secondary | ICD-10-CM | POA: Diagnosis not present

## 2021-04-12 DIAGNOSIS — L03115 Cellulitis of right lower limb: Secondary | ICD-10-CM | POA: Diagnosis not present

## 2021-04-12 DIAGNOSIS — I110 Hypertensive heart disease with heart failure: Secondary | ICD-10-CM | POA: Diagnosis not present

## 2021-04-12 DIAGNOSIS — S81801D Unspecified open wound, right lower leg, subsequent encounter: Secondary | ICD-10-CM | POA: Diagnosis not present

## 2021-04-12 DIAGNOSIS — L03116 Cellulitis of left lower limb: Secondary | ICD-10-CM | POA: Diagnosis not present

## 2021-04-13 ENCOUNTER — Telehealth: Payer: Self-pay | Admitting: Internal Medicine

## 2021-04-13 DIAGNOSIS — S81801D Unspecified open wound, right lower leg, subsequent encounter: Secondary | ICD-10-CM | POA: Diagnosis not present

## 2021-04-13 DIAGNOSIS — L03115 Cellulitis of right lower limb: Secondary | ICD-10-CM | POA: Diagnosis not present

## 2021-04-13 DIAGNOSIS — I110 Hypertensive heart disease with heart failure: Secondary | ICD-10-CM | POA: Diagnosis not present

## 2021-04-13 DIAGNOSIS — L03116 Cellulitis of left lower limb: Secondary | ICD-10-CM | POA: Diagnosis not present

## 2021-04-13 DIAGNOSIS — E1142 Type 2 diabetes mellitus with diabetic polyneuropathy: Secondary | ICD-10-CM | POA: Diagnosis not present

## 2021-04-13 NOTE — Telephone Encounter (Signed)
See below

## 2021-04-13 NOTE — Telephone Encounter (Signed)
  Mary from [facility] calling, saw patient today for a nursing visit they did not have an [place] boot because it was out of stock so she used rolled guaze and an Programmer, systems. She is requesting to see the patient twice next week because of this supply issue Corrie Dandy [phone] Okay to lvm

## 2021-04-16 DIAGNOSIS — L03116 Cellulitis of left lower limb: Secondary | ICD-10-CM | POA: Diagnosis not present

## 2021-04-16 DIAGNOSIS — L03115 Cellulitis of right lower limb: Secondary | ICD-10-CM | POA: Diagnosis not present

## 2021-04-16 DIAGNOSIS — S81801D Unspecified open wound, right lower leg, subsequent encounter: Secondary | ICD-10-CM | POA: Diagnosis not present

## 2021-04-16 DIAGNOSIS — E1142 Type 2 diabetes mellitus with diabetic polyneuropathy: Secondary | ICD-10-CM | POA: Diagnosis not present

## 2021-04-16 DIAGNOSIS — I110 Hypertensive heart disease with heart failure: Secondary | ICD-10-CM | POA: Diagnosis not present

## 2021-04-16 NOTE — Telephone Encounter (Signed)
Unable to get in contact with [facility]. LDVM with verbal orders from Dr. Okey Dupre. Office number was provided in case she has additional questions or concerns.

## 2021-04-16 NOTE — Telephone Encounter (Signed)
Fine for verbals °

## 2021-04-17 DIAGNOSIS — L03115 Cellulitis of right lower limb: Secondary | ICD-10-CM | POA: Diagnosis not present

## 2021-04-17 DIAGNOSIS — E1142 Type 2 diabetes mellitus with diabetic polyneuropathy: Secondary | ICD-10-CM | POA: Diagnosis not present

## 2021-04-17 DIAGNOSIS — S81801D Unspecified open wound, right lower leg, subsequent encounter: Secondary | ICD-10-CM | POA: Diagnosis not present

## 2021-04-17 DIAGNOSIS — I110 Hypertensive heart disease with heart failure: Secondary | ICD-10-CM | POA: Diagnosis not present

## 2021-04-17 DIAGNOSIS — L03116 Cellulitis of left lower limb: Secondary | ICD-10-CM | POA: Diagnosis not present

## 2021-04-24 ENCOUNTER — Telehealth: Payer: Self-pay | Admitting: Internal Medicine

## 2021-04-24 ENCOUNTER — Telehealth: Payer: Self-pay | Admitting: Cardiovascular Disease

## 2021-04-24 DIAGNOSIS — L03116 Cellulitis of left lower limb: Secondary | ICD-10-CM | POA: Diagnosis not present

## 2021-04-24 DIAGNOSIS — E1142 Type 2 diabetes mellitus with diabetic polyneuropathy: Secondary | ICD-10-CM | POA: Diagnosis not present

## 2021-04-24 DIAGNOSIS — S81801D Unspecified open wound, right lower leg, subsequent encounter: Secondary | ICD-10-CM | POA: Diagnosis not present

## 2021-04-24 DIAGNOSIS — L03115 Cellulitis of right lower limb: Secondary | ICD-10-CM | POA: Diagnosis not present

## 2021-04-24 DIAGNOSIS — I110 Hypertensive heart disease with heart failure: Secondary | ICD-10-CM | POA: Diagnosis not present

## 2021-04-24 NOTE — Telephone Encounter (Signed)
Spoke with patient and she noticed her weights going up about a week ago Advised patient moving forward to contact office for weight gain of 2-3 lbs in 24 hours and 5 lbs in 7 days No change in diet Blood pressure today 123/60's Denies shortness of breath LE edema that does go down some at night but can feel coming back as soon as she stands up  Advised ok to take an extra Torsemide today, and if no improvement tomorrow as well.  Keep appointment with Philomena Course PA as scheduled 5/19 Will forward to Dr Duke Salvia and Philomena Course PA for review

## 2021-04-24 NOTE — Telephone Encounter (Signed)
Marcelino Duster from [facility] calling to report patient has red blister on feet, 7 pound weight gain in 1 week Advised to also reach out to Cardiology group Marcelino Duster [phone]

## 2021-04-24 NOTE — Telephone Encounter (Signed)
Agree with plan. I will evaluate Thursday and get some labs as well.

## 2021-04-24 NOTE — Telephone Encounter (Signed)
[name] from [facility] called   Reports swelling in both legs that was worse than normal. Patient states her legs are warm and tender, she feels like they are going to blister. Advised by the nurse to keep them elevated. Patient is scheduled for an appointment tomorrow with them.   Best contact #: [phone]

## 2021-04-24 NOTE — Telephone Encounter (Signed)
Pt c/o swelling: STAT is pt has developed SOB within 24 hours  1) How much weight have you gained and in what time span? 7 lbs in a week   2) If swelling, where is the swelling located? Lower extremities (the knee below)   3) Are you currently taking a fluid pill? Yes, torsemide   4) Are you currently SOB? No, vitals all really good today.   5) Do you have a log of your daily weights (if so, list)?   04/24/21 180 lbs  04/17/21 173 lbs  04/16/21 173 lbs  04/13/21 170 lbs  04/12/21 170 lbs   04/11/21 170 lbs   6) Have you gained 3 pounds in a day or 5 pounds in a week? Yes   7) Have you traveled recently? No    No other symptoms. [facility] is calling to report. Has spots on both legs that hurt. Call Karrin back to advise in regards to this. Feel free to callback Marcelino Duster if there are further questions.

## 2021-04-25 DIAGNOSIS — I110 Hypertensive heart disease with heart failure: Secondary | ICD-10-CM | POA: Diagnosis not present

## 2021-04-25 DIAGNOSIS — L03115 Cellulitis of right lower limb: Secondary | ICD-10-CM | POA: Diagnosis not present

## 2021-04-25 DIAGNOSIS — E1142 Type 2 diabetes mellitus with diabetic polyneuropathy: Secondary | ICD-10-CM | POA: Diagnosis not present

## 2021-04-25 DIAGNOSIS — L03116 Cellulitis of left lower limb: Secondary | ICD-10-CM | POA: Diagnosis not present

## 2021-04-25 DIAGNOSIS — S81801D Unspecified open wound, right lower leg, subsequent encounter: Secondary | ICD-10-CM | POA: Diagnosis not present

## 2021-04-25 NOTE — Telephone Encounter (Signed)
Needs visit

## 2021-04-25 NOTE — Telephone Encounter (Signed)
See below

## 2021-04-26 ENCOUNTER — Other Ambulatory Visit: Payer: Self-pay

## 2021-04-26 ENCOUNTER — Other Ambulatory Visit: Payer: Self-pay | Admitting: Internal Medicine

## 2021-04-26 ENCOUNTER — Ambulatory Visit: Payer: Medicare Other

## 2021-04-26 ENCOUNTER — Ambulatory Visit
Admission: RE | Admit: 2021-04-26 | Discharge: 2021-04-26 | Disposition: A | Discharge status: Home / Self Care | Payer: Medicare Other | Source: Ambulatory Visit | Attending: Internal Medicine | Admitting: Internal Medicine

## 2021-04-26 DIAGNOSIS — N63 Unspecified lump in unspecified breast: Secondary | ICD-10-CM

## 2021-04-26 DIAGNOSIS — Z1231 Encounter for screening mammogram for malignant neoplasm of breast: Secondary | ICD-10-CM

## 2021-04-27 NOTE — Telephone Encounter (Signed)
Spoke with the patient and she stated that they were not blisters on her legs, but red spots from the dressing being too tight around her legs. She sated that her legs look better now. She declined a visit due to lack of transportation.

## 2021-04-30 ENCOUNTER — Other Ambulatory Visit: Payer: Self-pay | Admitting: Internal Medicine

## 2021-04-30 DIAGNOSIS — S81801D Unspecified open wound, right lower leg, subsequent encounter: Secondary | ICD-10-CM | POA: Diagnosis not present

## 2021-04-30 DIAGNOSIS — E1142 Type 2 diabetes mellitus with diabetic polyneuropathy: Secondary | ICD-10-CM | POA: Diagnosis not present

## 2021-04-30 DIAGNOSIS — L03116 Cellulitis of left lower limb: Secondary | ICD-10-CM | POA: Diagnosis not present

## 2021-04-30 DIAGNOSIS — I110 Hypertensive heart disease with heart failure: Secondary | ICD-10-CM | POA: Diagnosis not present

## 2021-04-30 DIAGNOSIS — L03115 Cellulitis of right lower limb: Secondary | ICD-10-CM | POA: Diagnosis not present

## 2021-05-03 ENCOUNTER — Telehealth: Payer: Self-pay | Admitting: Internal Medicine

## 2021-05-03 DIAGNOSIS — L03115 Cellulitis of right lower limb: Secondary | ICD-10-CM | POA: Diagnosis not present

## 2021-05-03 DIAGNOSIS — S81801D Unspecified open wound, right lower leg, subsequent encounter: Secondary | ICD-10-CM | POA: Diagnosis not present

## 2021-05-03 DIAGNOSIS — E1142 Type 2 diabetes mellitus with diabetic polyneuropathy: Secondary | ICD-10-CM | POA: Diagnosis not present

## 2021-05-03 DIAGNOSIS — L03116 Cellulitis of left lower limb: Secondary | ICD-10-CM | POA: Diagnosis not present

## 2021-05-03 DIAGNOSIS — I110 Hypertensive heart disease with heart failure: Secondary | ICD-10-CM | POA: Diagnosis not present

## 2021-05-03 NOTE — Telephone Encounter (Signed)
Called the patient to offer a appointment to be seen tomorrow. Patient stated that it is hard for to get transportation bring her to the office in short notice. She stated that she has to arrange for transportation 3 days prior. Please advise

## 2021-05-03 NOTE — Telephone Encounter (Signed)
[name] [phone]  Reporting the patient's legs are red and warm; pt has a  history of cellulitis   Recommend for the patient to be seen in office, please advise

## 2021-05-04 ENCOUNTER — Telehealth (INDEPENDENT_AMBULATORY_CARE_PROVIDER_SITE_OTHER): Payer: Medicare Other | Admitting: Internal Medicine

## 2021-05-04 ENCOUNTER — Encounter: Payer: Self-pay | Admitting: Internal Medicine

## 2021-05-04 DIAGNOSIS — L03116 Cellulitis of left lower limb: Secondary | ICD-10-CM | POA: Diagnosis not present

## 2021-05-04 DIAGNOSIS — L03115 Cellulitis of right lower limb: Secondary | ICD-10-CM

## 2021-05-04 MED ORDER — POTASSIUM CHLORIDE ER 20 MEQ PO TBCR: 10.0000 meq | EXTENDED_RELEASE_TABLET | Freq: Every day | ORAL | 3 refills | Status: DC

## 2021-05-04 MED ORDER — NYSTATIN 100000 UNIT/GM EX POWD: 1.0000 "application " | Freq: Three times a day (TID) | CUTANEOUS | 3 refills | Status: DC

## 2021-05-04 MED ORDER — DOXYCYCLINE HYCLATE 100 MG PO TABS: 100.0000 mg | ORAL_TABLET | Freq: Two times a day (BID) | ORAL | 0 refills | Status: AC

## 2021-05-04 NOTE — Progress Notes (Signed)
Virtual Visit via Audio Note  I connected with [name] on 05/04/21 at  8:40 AM EDT by an audio-only enabled telemedicine application and verified that I am speaking with the correct person using two identifiers.  The patient and the provider were at separate locations throughout the entire encounter. Patient location: home, Provider location: work   I discussed the limitations of evaluation and management by telemedicine and the availability of in person appointments. The patient expressed understanding and agreed to proceed. The patient and the provider were the only parties present for the visit unless noted in HPI below.  History of Present Illness: The patient is a 75 y.o. female with visit for concerns about redness to legs, past cellulitis, chronic swelling. Started getting worse in the last week or two. Has no dietary changes. Not having to use una boots anymore. Used different dressing and this caused sores and bleeding. Went back to [employer] and has several sores.   Observations/Objective: A and O times 3, no coughing or dyspnea, voice strong  Assessment and Plan: See problem oriented charting  Follow Up Instructions: rx doxycycline, refill potassium and nystatin powder  Visit time 12 minutes in non-face to face communication with patient and coordination of care.  I discussed the assessment and treatment plan with the patient. The patient was provided an opportunity to ask questions and all were answered. The patient agreed with the plan and demonstrated an understanding of the instructions.   The patient was advised to call back or seek an in-person evaluation if the symptoms worsen or if the condition fails to improve as anticipated.  Myrlene Broker, MD

## 2021-05-04 NOTE — Telephone Encounter (Signed)
Can do virtual visit for this today.

## 2021-05-04 NOTE — Assessment & Plan Note (Signed)
Rx doxycycline 10 day course. Rx nystatin powder for stomach region.

## 2021-05-04 NOTE — Telephone Encounter (Signed)
Patient has been scheduled for a 8:40 am virtual visit with Dr. Okey Dupre.

## 2021-05-08 ENCOUNTER — Ambulatory Visit: Payer: Medicare Other | Admitting: Student

## 2021-05-08 DIAGNOSIS — I110 Hypertensive heart disease with heart failure: Secondary | ICD-10-CM | POA: Diagnosis not present

## 2021-05-08 DIAGNOSIS — L03116 Cellulitis of left lower limb: Secondary | ICD-10-CM | POA: Diagnosis not present

## 2021-05-08 DIAGNOSIS — S81801D Unspecified open wound, right lower leg, subsequent encounter: Secondary | ICD-10-CM | POA: Diagnosis not present

## 2021-05-08 DIAGNOSIS — E1142 Type 2 diabetes mellitus with diabetic polyneuropathy: Secondary | ICD-10-CM | POA: Diagnosis not present

## 2021-05-08 DIAGNOSIS — L03115 Cellulitis of right lower limb: Secondary | ICD-10-CM | POA: Diagnosis not present

## 2021-05-09 ENCOUNTER — Other Ambulatory Visit: Payer: Self-pay | Admitting: Internal Medicine

## 2021-05-09 DIAGNOSIS — IMO0002 Reserved for concepts with insufficient information to code with codable children: Secondary | ICD-10-CM

## 2021-05-09 DIAGNOSIS — E1149 Type 2 diabetes mellitus with other diabetic neurological complication: Secondary | ICD-10-CM

## 2021-05-11 DIAGNOSIS — I2510 Atherosclerotic heart disease of native coronary artery without angina pectoris: Secondary | ICD-10-CM | POA: Diagnosis not present

## 2021-05-11 DIAGNOSIS — I5032 Chronic diastolic (congestive) heart failure: Secondary | ICD-10-CM | POA: Diagnosis not present

## 2021-05-11 DIAGNOSIS — I110 Hypertensive heart disease with heart failure: Secondary | ICD-10-CM | POA: Diagnosis not present

## 2021-05-11 DIAGNOSIS — L03115 Cellulitis of right lower limb: Secondary | ICD-10-CM | POA: Diagnosis not present

## 2021-05-11 DIAGNOSIS — L03116 Cellulitis of left lower limb: Secondary | ICD-10-CM | POA: Diagnosis not present

## 2021-05-14 ENCOUNTER — Telehealth: Payer: Self-pay | Admitting: Internal Medicine

## 2021-05-14 DIAGNOSIS — I2510 Atherosclerotic heart disease of native coronary artery without angina pectoris: Secondary | ICD-10-CM | POA: Diagnosis not present

## 2021-05-14 DIAGNOSIS — I110 Hypertensive heart disease with heart failure: Secondary | ICD-10-CM | POA: Diagnosis not present

## 2021-05-14 DIAGNOSIS — I5032 Chronic diastolic (congestive) heart failure: Secondary | ICD-10-CM | POA: Diagnosis not present

## 2021-05-14 DIAGNOSIS — L03115 Cellulitis of right lower limb: Secondary | ICD-10-CM | POA: Diagnosis not present

## 2021-05-14 DIAGNOSIS — L03116 Cellulitis of left lower limb: Secondary | ICD-10-CM | POA: Diagnosis not present

## 2021-05-14 NOTE — Telephone Encounter (Signed)
If blood sugars are persistently going low needs to call us to clarify insulin dosing. If just one time low okay to just continue monitoring without change.

## 2021-05-14 NOTE — Telephone Encounter (Signed)
See below

## 2021-05-14 NOTE — Telephone Encounter (Signed)
   Mary from [facility] calling to report patient had low blood sugar before eating on 6/5 Her blood sugar was 60, she declined for EMS to be called. Today after eating blood sugar was [address] [phone]

## 2021-05-15 NOTE — Telephone Encounter (Signed)
Patient has been made aware and verbalized understanding.  

## 2021-05-16 ENCOUNTER — Ambulatory Visit: Payer: Medicare Other | Admitting: Physician Assistant

## 2021-05-16 ENCOUNTER — Other Ambulatory Visit (HOSPITAL_COMMUNITY): Payer: Medicare Other

## 2021-05-16 NOTE — Progress Notes (Deleted)
[facility]   MULTIDISCIPLINARY HEART VALVE CLINIC                                       Cardiology Office Note    Date:  05/16/2021   ID:  [name], DOB 06/13/1946, MRN [id number]  PCP:  Myrlene Broker, MD  Cardiologist: Dr. Duke Salvia / Dr. Excell Seltzer & Dr. Laneta Simmers (TAVR)  CC: 1 year s/p TAVR  History of Present Illness:  [name] is a 75 y.o. female with a history of HTN, HLD, uncontrolled DMT2, chronic LE edema, non obst CAD, and severe AS s/p TAVR (05/17/20) who presents to clinic for follow up.   She was admitted from 5/28-05/10/20 for chest pain and elevated troponin. High-sensitivity troponin peaked at 149 then trended down to 128. Cath showed non obst CAD. Echo during this admission showed severe AS and plans were made for outpatient TAVR work up. She was then readmitted for syncope felt to be related to orthostatic hypotension and dehydration. She underwent TAVR work up and ultimately successful TAVR with a 23 mm Edwards Sapien 3 Ultra THV via the TF approach on 05/17/2020.  Postoperative echo showed EF 60%, normally functioning TAVR with a mean gradient of 20 mmHg and no PVL. She was discharged on aspirin and Plavix as well as Lasix 20 mg for chronic lower extremity edema.  Plans were made to discharge her home to a SNF, however, the patient refused and went home with her sister-in-law. She did not show up for her 1 week TOC follow up. At one month follow up she had worsening LE and pedal edema. Lasix was increased to 60mg  daily. NT-pro BNP was minimally elevated at 308.  Increased lasix did not significantly helpd her symptoms. Echo at that time showed EF 60%, normally functioning TAVR with a mean gradient of 12 mmHg and no PVL.  She was seen by Dr. in follow-up in October 2021 and Unna boots were ordered. She was admitted in 03/2021 for bilateral LE cellulitis with purulence. Sh was treated with IV vanc and cefepime.   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 05-19-1984 and Laneta Simmers   Venous insufficiency    Ventral hernia     Past Surgical History:  Procedure Laterality Date   ABDOMINAL HYSTERECTOMY  2001   TAH-BSO   CHOLECYSTECTOMY  2001   COLONOSCOPY  2013   diverticulosis    ESOPHAGOGASTRODUODENOSCOPY  2013   normal    INCISIONAL HERNIA REPAIR     RIGHT/LEFT HEART CATH AND CORONARY ANGIOGRAPHY N/A 05/09/2020   Procedure: RIGHT/LEFT HEART CATH AND CORONARY ANGIOGRAPHY;  Surgeon: 07/09/2020, MD;  Location: [facility] INVASIVE CV LAB;  Service: Cardiovascular;  Laterality: N/A;   TEE WITHOUT CARDIOVERSION N/A 05/17/2020   Procedure: TRANSESOPHAGEAL ECHOCARDIOGRAM (TEE);  Surgeon: Tonny Bollmanooper, Michael, MD;  [facility];  Service: Open Heart Surgery;  Laterality: N/A;   TONSILLECTOMY     TRANSCATHETER AORTIC VALVE REPLACEMENT, TRANSFEMORAL N/A 05/17/2020   Procedure: TRANSCATHETER AORTIC VALVE REPLACEMENT, TRANSFEMORAL;  Surgeon: Tonny Bollmanooper, Michael, MD;  [facility];  Service: Open Heart Surgery;  Laterality: N/A;    Current Medications: Outpatient Medications Prior to Visit  Medication Sig Dispense Refill   acetaminophen (TYLENOL) 325 MG tablet Take 325 mg by mouth daily as needed for mild pain or headache.     aspirin 81 MG tablet Take 81 mg by mouth daily.     atorvastatin (LIPITOR) 40 MG tablet Take 1 tablet (40 mg  total) by mouth daily. 90 tablet 1   citalopram (CELEXA) 20 MG tablet TAKE 1 TABLET BY MOUTH  DAILY 90 tablet 1   Continuous Blood Gluc Sensor (FREESTYLE LIBRE SENSOR SYSTEM) MISC Use to monitor sugars 1 each 0   esomeprazole (NEXIUM) 20 MG capsule Take 1 capsule (20 mg total) by mouth daily at 12 noon. 90 capsule 3   ezetimibe (ZETIA) 10 MG tablet Take 1 tablet (10 mg total) by mouth daily. 90 tablet 3   gabapentin (NEURONTIN) 300 MG capsule TAKE 1 CAPSULE BY MOUTH 3  TIMES DAILY 270 capsule 3   glucose blood (ONETOUCH VERIO) test strip USE AS DIRECTED 200 strip 3   insulin NPH-regular Human (70-30) 100 UNIT/ML injection Inject 45 Units into the skin 2 (two) times daily with a meal.     Insulin Pen Needle 31G X 8 MM MISC Use to inject insulin four times daily E11.41 100 each 3   metFORMIN (GLUCOPHAGE) 1000 MG tablet TAKE ONE-HALF TABLET BY  MOUTH TWICE DAILY WITH A  MEAL 90 tablet 3   metoprolol tartrate (LOPRESSOR) 25 MG tablet Take 0.5 tablets (12.5 mg total) by mouth 2 (two) times daily. 90 tablet 3   nystatin (MYCOSTATIN/NYSTOP) powder Apply 1 application topically 3 (three) times daily. 120 g 3   Potassium Chloride ER 20 MEQ TBCR Take 10 mEq by mouth daily. 90 tablet 3   Semaglutide (RYBELSUS) 7 MG TABS Take 7 mg by mouth daily. 90 tablet 0   sodium chloride (OCEAN) 0.65 % SOLN nasal spray Place 1 spray into both nostrils 4 (four) times daily. (Patient taking differently: Place 1 spray into both nostrils 4 (four) times daily as needed for congestion.) 30 mL 2   tetrahydrozoline 0.05 % ophthalmic solution Place 1-2 drops into both eyes 2 (two) times daily as needed (Dry eyes).     torsemide (DEMADEX) 20 MG tablet Take 1 tablet (20 mg total) by mouth daily. 180 tablet 1   Trolamine Salicylate (ASPERCREME EX) Apply 1 application topically daily as needed (shoulder/hips/knee/knuckles).     No facility-administered medications prior to visit.     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 Patient needs to submit further paperwork to complete   [facility]  March 06, 2010 2:35 PM   Financial assistance approved for 100% discount at [facility] and has [facility] card   Rudell Cobb  March 12, 2010 3:55 PM   Social Determinants of Health   Financial Resource Strain: Medium Risk   Difficulty of Paying Living Expenses: Somewhat hard  Food Insecurity: No Food Insecurity   Worried About Programme researcher, broadcasting/film/video in the Last Year: Never true   Barista in the Last Year: Never true  Transportation Needs: Unmet Transportation Needs   Lack of Transportation (Medical): Yes   Lack of Transportation (Non-Medical): Yes  Physical Activity: Not on file  Stress: Not on file  Social Connections: Not on file     Family History:  The patient's family history includes Breast cancer in her paternal aunt; CVA in her father; Diabetes in her paternal grandmother and sister; Heart failure in her father and mother; Other in her brother and sister; Ovarian cancer in her sister.     ROS:   Please see the history of present illness.    ROS All other systems reviewed and are negative.   PHYSICAL EXAM:   VS:   There were no vitals taken for this visit.   GEN: Well nourished, well developed, in no acute distress HEENT: normal Neck: no JVD or masses Cardiac: RRR; no murmurs, rubs, or gallops. 2+ bilateral edema up to knees and in feet.  Respiratory:  clear to auscultation bilaterally, normal work of breathing GI: soft, nontender, nondistended, + BS MS: no deformity or atrophy Skin: warm and dry, no rash Neuro:  Alert and Oriented x 3, Strength and sensation are intact Psych: euthymic mood, full affect   Wt Readings from Last 3 Encounters:  03/22/21 163 lb 9.6 oz (74.2 kg)  03/14/21 193 lb (87.5 kg)  02/22/21 193 lb 3.2 oz (87.6 kg)      Studies/Labs Reviewed:   EKG:  EKG is NOT ordered today.    Recent Labs: 02/22/2021: Pro B Natriuretic peptide (BNP) 55.0 03/14/2021: ALT 20; B Natriuretic Peptide 116.8 03/15/2021: Hemoglobin 12.7; Platelets 201 03/18/2021: BUN 27; Creatinine, Ser 0.76; Magnesium 2.8; Potassium 4.0; Sodium 136   Lipid Panel    Component Value Date/Time   CHOL 175 02/22/2021 1536   TRIG 155.0 (H) 02/22/2021 1536   HDL 36.80 (L) 02/22/2021 1536   CHOLHDL 5 02/22/2021 1536   VLDL 31.0 02/22/2021 1536   LDLCALC 107 (H) 02/22/2021 1536   LDLDIRECT 110.0 11/12/2018 1419    Additional studies/ records that were reviewed today include:  TAVR OPERATIVE NOTE     Date of Procedure:                05/17/2020   Preoperative Diagnosis:      Severe Aortic Stenosis    Postoperative Diagnosis:    Same    Procedure:        Transcatheter Aortic Valve Replacement - Percutaneous Right Transfemoral  Approach             Edwards Sapien 3 Ultra THV (size 23 mm, model # I1735201, serial # [id number])              Co-Surgeons:                        Alleen Borne, MD and Tonny Bollman, MD      Anesthesiologist:                  Jairo Ben, MD   Echocardiographer:              Lacretia Nicks. O'Neal, MD   Pre-operative Echo Findings: Severe aortic stenosis Normal left ventricular  systolic function   Post-operative Echo Findings: No paravalvular leak Normal left ventricular systolic function   _______________________     Echo 05/18/20 IMPRESSIONS   1. 23 mm S3. V max 2.9 m/s, mean gardient 20 mmHG, EOA 1.72 cm2, DI 0.50.  Vmax/gradient slightly higher due to high flow (LVOT VTI 26 cm). Normally  functioning prosthetic TAVR without evidence of paravalvular leak. The  aortic valve has been  repaired/replaced. Aortic valve regurgitation is not visualized. There is  a 23 mm Edwards Sapien prosthetic (TAVR) valve present in the aortic  position. Procedure Date: 05/17/2020.   2. Left ventricular ejection fraction, by estimation, is 60 to 65%. The  left ventricle has normal function. The left ventricle has no regional  wall motion abnormalities. Left ventricular diastolic parameters are  consistent with Grade II diastolic  dysfunction (pseudonormalization). Elevated left atrial pressure.   3. Right ventricular systolic function is normal. The right ventricular  size is normal. There is normal pulmonary artery systolic pressure. The  estimated right ventricular systolic pressure is 29.8 mmHg.   4. The mitral valve is grossly normal. Trivial mitral valve  regurgitation. No evidence of mitral stenosis.   5. The inferior vena cava is normal in size with greater than 50%  respiratory variability, suggesting right atrial pressure of 3 mmHg.   Comparison(s): Changes from prior study are noted.    ____________________   Echo 06/15/20 IMPRESSIONS   1. Definity used; normal LV systolic function; grade 2 diastolic  dysfunction; mild LVH; s/p TAVR with mean gradient 12 mmHg, AVA 1.1 cm2  and no AI.   2. Left ventricular ejection fraction, by estimation, is 60 to 65%. The  left ventricle has normal function. The left ventricle has no regional  wall motion abnormalities. There is mild left ventricular hypertrophy.  Left ventricular diastolic parameters  are consistent  with Grade II diastolic dysfunction (pseudonormalization).  Elevated left atrial pressure.   3. Right ventricular systolic function is normal. The right ventricular  size is normal.   4. The mitral valve is normal in structure. Trivial mitral valve  regurgitation. No evidence of mitral stenosis.   5. The aortic valve has been repaired/replaced. Aortic valve  regurgitation is not visualized. No aortic stenosis is present. There is a  bioprosthetic valve present in the aortic position.   6. The inferior vena cava is normal in size with greater than 50%  respiratory variability, suggesting right atrial pressure of 3 mmHg.    ____________________  Echo 05/16/21    ASSESSMENT & PLAN:   Severe AS s/p TAVR: Echo today shows  She has NYHA class II symptoms, but not very active. Continue on aspirin. SBE prophylaxis discussed; I have RX'd amoxicillin  Chronic lower extremity edema: Lasix  did not really help with this.  She is now has a prescription for [employer].  HTN:  HLD:  Medication Adjustments/Labs and Tests Ordered: Current medicines are reviewed at length with the patient today.  Concerns regarding medicines are outlined above.  Medication changes, Labs and Tests ordered today are listed in the Patient Instructions below. There are no Patient Instructions on file for this visit.   Signed, Cline Crock, PA-C  05/16/2021 11:11 AM    [facility] [address] [place], [place], [place]  [address] Phone: [phone]; Fax: [phone]

## 2021-05-17 ENCOUNTER — Other Ambulatory Visit: Payer: Medicare Other

## 2021-05-17 ENCOUNTER — Telehealth (HOSPITAL_COMMUNITY): Payer: Self-pay | Admitting: Licensed Clinical Social Worker

## 2021-05-17 DIAGNOSIS — I2510 Atherosclerotic heart disease of native coronary artery without angina pectoris: Secondary | ICD-10-CM | POA: Diagnosis not present

## 2021-05-17 DIAGNOSIS — L03115 Cellulitis of right lower limb: Secondary | ICD-10-CM | POA: Diagnosis not present

## 2021-05-17 DIAGNOSIS — I110 Hypertensive heart disease with heart failure: Secondary | ICD-10-CM | POA: Diagnosis not present

## 2021-05-17 DIAGNOSIS — L03116 Cellulitis of left lower limb: Secondary | ICD-10-CM | POA: Diagnosis not present

## 2021-05-17 DIAGNOSIS — I5032 Chronic diastolic (congestive) heart failure: Secondary | ICD-10-CM | POA: Diagnosis not present

## 2021-05-17 NOTE — Telephone Encounter (Signed)
CSW contacted patient to set up enrollment with [facility]. Patient informed of process and contact information for [employer]. CSW arranged transportation to appointment on June 07, 2021 at [facility]. CSW available as needed. Lasandra Beech, LCSW, CCSW-MCS [phone]

## 2021-05-18 ENCOUNTER — Telehealth: Payer: Self-pay | Admitting: Internal Medicine

## 2021-05-18 NOTE — Telephone Encounter (Signed)
   [name] DOB: August 10, 1946 MRN: [id number]   RIDER WAIVER AND RELEASE OF LIABILITY  For purposes of improving physical access to our facilities, [facility] is pleased to partner with third parties to provide [place] patients or other authorized individuals the option of convenient, on-demand ground transportation services (the [employer]") through use of the technology service that enables users to request on-demand ground transportation from independent third-party providers.  By opting to use and accept these [employer], I, the undersigned, hereby agree on behalf of myself, and on behalf of any minor child using the Science writer for whom I am the parent or legal guardian, as follows:  Science writer provided to me are provided by independent third-party transportation providers who are not [employer] or employees and who are unaffiliated with [employer]. [facility] is neither a transportation carrier nor a common or public carrier. [facility] has no control over the quality or safety of the transportation that occurs as a result of the [employer]. [facility] cannot guarantee that any third-party transportation provider will complete any arranged transportation service. [facility] makes no representation, warranty, or guarantee regarding the reliability, timeliness, quality, safety, suitability, or availability of any of the Transport Services or that they will be error free. I fully understand that traveling by vehicle involves risks and dangers of serious bodily injury, including permanent disability, paralysis, and death. I agree, on behalf of myself and on behalf of any minor child using the Transport Services for whom I am the parent or legal guardian, that the entire risk arising out of my use of the [employer] remains solely with me, to the maximum extent permitted under applicable law. The [employer] are  provided "as is" and "as available." [facility] disclaims all representations and warranties, express, implied or statutory, not expressly set out in these terms, including the implied warranties of merchantability and fitness for a particular purpose. I hereby waive and release [facility], its agents, employees, officers, directors, representatives, insurers, attorneys, assigns, successors, subsidiaries, and affiliates from any and all past, present, or future claims, demands, liabilities, actions, causes of action, or suits of any kind directly or indirectly arising from acceptance and use of the [employer]. I further waive and release [facility] and its affiliates from all present and future liability and responsibility for any injury or death to persons or damages to property caused by or related to the use of the [employer]. I have read this Waiver and Release of Liability, and I understand the terms used in it and their legal significance. This Waiver is freely and voluntarily given with the understanding that my right (as well as the right of any minor child for whom I am the parent or legal guardian using the [employer]) to legal recourse against  in connection with the [employer] is knowingly surrendered in return for use of these services.   I attest that I read the consent document to [name], gave Ms. Allan the opportunity to ask questions and answered the questions asked (if any). I affirm that [name] then provided consent for she's participation in this program.    Evette Doffing

## 2021-05-21 DIAGNOSIS — I5032 Chronic diastolic (congestive) heart failure: Secondary | ICD-10-CM | POA: Diagnosis not present

## 2021-05-21 DIAGNOSIS — L03116 Cellulitis of left lower limb: Secondary | ICD-10-CM | POA: Diagnosis not present

## 2021-05-21 DIAGNOSIS — I110 Hypertensive heart disease with heart failure: Secondary | ICD-10-CM | POA: Diagnosis not present

## 2021-05-21 DIAGNOSIS — L03115 Cellulitis of right lower limb: Secondary | ICD-10-CM | POA: Diagnosis not present

## 2021-05-21 DIAGNOSIS — I2510 Atherosclerotic heart disease of native coronary artery without angina pectoris: Secondary | ICD-10-CM | POA: Diagnosis not present

## 2021-05-22 ENCOUNTER — Ambulatory Visit: Payer: Medicare Other | Admitting: Internal Medicine

## 2021-05-22 DIAGNOSIS — Z0289 Encounter for other administrative examinations: Secondary | ICD-10-CM

## 2021-05-23 DIAGNOSIS — E1165 Type 2 diabetes mellitus with hyperglycemia: Secondary | ICD-10-CM | POA: Diagnosis not present

## 2021-05-23 DIAGNOSIS — Z794 Long term (current) use of insulin: Secondary | ICD-10-CM | POA: Diagnosis not present

## 2021-05-24 DIAGNOSIS — I2510 Atherosclerotic heart disease of native coronary artery without angina pectoris: Secondary | ICD-10-CM | POA: Diagnosis not present

## 2021-05-24 DIAGNOSIS — I110 Hypertensive heart disease with heart failure: Secondary | ICD-10-CM | POA: Diagnosis not present

## 2021-05-24 DIAGNOSIS — I5032 Chronic diastolic (congestive) heart failure: Secondary | ICD-10-CM | POA: Diagnosis not present

## 2021-05-24 DIAGNOSIS — L03115 Cellulitis of right lower limb: Secondary | ICD-10-CM | POA: Diagnosis not present

## 2021-05-24 DIAGNOSIS — L03116 Cellulitis of left lower limb: Secondary | ICD-10-CM | POA: Diagnosis not present

## 2021-05-25 ENCOUNTER — Other Ambulatory Visit: Payer: Self-pay | Admitting: Internal Medicine

## 2021-05-28 DIAGNOSIS — L03115 Cellulitis of right lower limb: Secondary | ICD-10-CM | POA: Diagnosis not present

## 2021-05-28 DIAGNOSIS — I2510 Atherosclerotic heart disease of native coronary artery without angina pectoris: Secondary | ICD-10-CM | POA: Diagnosis not present

## 2021-05-28 DIAGNOSIS — L03116 Cellulitis of left lower limb: Secondary | ICD-10-CM | POA: Diagnosis not present

## 2021-05-28 DIAGNOSIS — I5032 Chronic diastolic (congestive) heart failure: Secondary | ICD-10-CM | POA: Diagnosis not present

## 2021-05-28 DIAGNOSIS — I110 Hypertensive heart disease with heart failure: Secondary | ICD-10-CM | POA: Diagnosis not present

## 2021-05-30 ENCOUNTER — Telehealth: Payer: Self-pay | Admitting: Internal Medicine

## 2021-05-30 NOTE — Telephone Encounter (Signed)
   Frances Furbish calling [phone]  Crystal from [facility] calling to report patient has 9 pound weight gain since 6/16. Patient is not taking her torsemide (DEMADEX) 20 MG tablet

## 2021-05-31 DIAGNOSIS — L03115 Cellulitis of right lower limb: Secondary | ICD-10-CM | POA: Diagnosis not present

## 2021-05-31 DIAGNOSIS — I5032 Chronic diastolic (congestive) heart failure: Secondary | ICD-10-CM | POA: Diagnosis not present

## 2021-05-31 DIAGNOSIS — L03116 Cellulitis of left lower limb: Secondary | ICD-10-CM | POA: Diagnosis not present

## 2021-05-31 DIAGNOSIS — I2510 Atherosclerotic heart disease of native coronary artery without angina pectoris: Secondary | ICD-10-CM | POA: Diagnosis not present

## 2021-05-31 DIAGNOSIS — I110 Hypertensive heart disease with heart failure: Secondary | ICD-10-CM | POA: Diagnosis not present

## 2021-05-31 NOTE — Telephone Encounter (Signed)
Fyi.

## 2021-06-04 DIAGNOSIS — I5032 Chronic diastolic (congestive) heart failure: Secondary | ICD-10-CM | POA: Diagnosis not present

## 2021-06-04 DIAGNOSIS — I2510 Atherosclerotic heart disease of native coronary artery without angina pectoris: Secondary | ICD-10-CM | POA: Diagnosis not present

## 2021-06-04 DIAGNOSIS — L03115 Cellulitis of right lower limb: Secondary | ICD-10-CM | POA: Diagnosis not present

## 2021-06-04 DIAGNOSIS — I110 Hypertensive heart disease with heart failure: Secondary | ICD-10-CM | POA: Diagnosis not present

## 2021-06-04 DIAGNOSIS — L03116 Cellulitis of left lower limb: Secondary | ICD-10-CM | POA: Diagnosis not present

## 2021-06-04 NOTE — Telephone Encounter (Signed)
This should have gone to someone in office. We need to call patient and ask her to take her torsemide. Ask if any shortness of breath please.

## 2021-06-04 NOTE — Telephone Encounter (Signed)
Spoke with the patient and she stated that she is taking her torsemide everyday. She keeps her legs elevated and she denies any shortness of breath.

## 2021-06-04 NOTE — Telephone Encounter (Signed)
Ok thanks 

## 2021-06-07 ENCOUNTER — Encounter: Payer: Self-pay | Admitting: Physician Assistant

## 2021-06-07 ENCOUNTER — Other Ambulatory Visit: Payer: Self-pay

## 2021-06-07 ENCOUNTER — Ambulatory Visit (INDEPENDENT_AMBULATORY_CARE_PROVIDER_SITE_OTHER): Payer: Medicare Other | Admitting: Physician Assistant

## 2021-06-07 ENCOUNTER — Ambulatory Visit (HOSPITAL_COMMUNITY): Payer: Medicare Other | Attending: Cardiovascular Disease

## 2021-06-07 VITALS — BP 130/68 | HR 86 | Ht 64.0 in | Wt 182.0 lb

## 2021-06-07 DIAGNOSIS — I10 Essential (primary) hypertension: Secondary | ICD-10-CM

## 2021-06-07 DIAGNOSIS — Z952 Presence of prosthetic heart valve: Secondary | ICD-10-CM

## 2021-06-07 DIAGNOSIS — E785 Hyperlipidemia, unspecified: Secondary | ICD-10-CM

## 2021-06-07 DIAGNOSIS — I890 Lymphedema, not elsewhere classified: Secondary | ICD-10-CM | POA: Diagnosis not present

## 2021-06-07 LAB — ECHOCARDIOGRAM COMPLETE
AR max vel: 1.2 cm2
AV Area VTI: 1.31 cm2
AV Area mean vel: 1.3 cm2
AV Mean grad: 18.8 mmHg
AV Peak grad: 37 mmHg
Ao pk vel: 3.04 m/s
Area-P 1/2: 4.15 cm2
S' Lateral: 2.2 cm

## 2021-06-07 MED ORDER — AMOXICILLIN 500 MG PO TABS: 2000.0000 mg | ORAL_TABLET | ORAL | 12 refills | Status: DC

## 2021-06-07 MED ORDER — PERFLUTREN LIPID MICROSPHERE
1.0000 mL | INTRAVENOUS | Status: AC | PRN
  Administered 2021-06-07: 2 mL via INTRAVENOUS

## 2021-06-07 NOTE — Patient Instructions (Signed)
Medication Instructions:  Your physician recommends that you continue on your current medications as directed. Please refer to the Current Medication list given to you today.  *If you need a refill on your cardiac medications before your next appointment, please call your pharmacy*   Lab Work: None ordered   Testing/Procedures: Your physician has requested that you have an echocardiogram in 6 months (prior to your follow up with Dr. Duke Salvia). Echocardiography is a painless test that uses sound waves to create images of your heart. It provides your doctor with information about the size and shape of your heart and how well your heart's chambers and valves are working. This procedure takes approximately one hour. There are no restrictions for this procedure.    Follow-Up: At [facility], you and your health needs are our priority.  As part of our continuing mission to provide you with exceptional heart care, we have created designated Provider Care Teams.  These Care Teams include your primary Cardiologist (physician) and Advanced Practice Providers (APPs -  Physician Assistants and Nurse Practitioners) who all work together to provide you with the care you need, when you need it.  We recommend signing up for the patient portal called "MyChart".  Sign up information is provided on this After Visit Summary.  MyChart is used to connect with patients for Virtual Visits (Telemedicine).  Patients are able to view lab/test results, encounter notes, upcoming appointments, etc.  Non-urgent messages can be sent to your provider as well.   To learn more about what you can do with MyChart, go to [email or web address].    Your next appointment:   7 month(s) (after your echocardiogram has been completed)  The format for your next appointment:   In Person  Provider:   Chilton Si, MD    Thank you for choosing [facility]!!

## 2021-06-07 NOTE — Progress Notes (Signed)
HEART AND VASCULAR CENTER   MULTIDISCIPLINARY HEART VALVE CLINIC                                     Cardiology Office Note:    Date:  06/07/2021   ID:  [name], DOB 17-Mar-1946, MRN [id number]  PCP:  Myrlene Broker, MD  [facility] Cardiologist:  Chilton Si, MD  / Dr. Excell Seltzer & Dr. Laneta Simmers (TAVR) [facility] Electrophysiologist:  None   Referring MD: Myrlene Broker, *   1 year s/p TAVR  History of Present Illness:    [name] is a 75 y.o. female with a history of HTN, HLD, uncontrolled DMT2, chronic LE edema, non obst CAD, and severe AS s/p TAVR (05/17/20) who presents to clinic for follow up.   She was admitted from 5/28-05/10/20 for chest pain and elevated troponin. High-sensitivity troponin peaked at 149 then trended down to 128. Cath showed non obst CAD. Echo during this admission showed severe AS and plans were made for outpatient TAVR work up. She was then readmitted for syncope felt to be related to orthostatic hypotension and dehydration. She underwent TAVR work up and ultimately successful TAVR with a 23 mm Edwards Sapien 3 Ultra THV via the TF approach on 05/17/2020.  Postoperative echo showed EF 60%, normally functioning TAVR with a mean gradient of 20 mmHg and no PVL. She was discharged on aspirin and Plavix as well as Lasix 20 mg for chronic lower extremity edema.  Plans were made to discharge her home to a SNF, however, the patient refused and went home with her sister-in-law. She did not show up for her 1 week TOC follow up. At one month follow up she had worsening LE and pedal edema. Lasix was increased to  daily. NT-pro BNP was minimally elevated at 308.  Increased lasix did not significantly helpd her symptoms. Echo at that time showed EF 60%, normally functioning TAVR with a mean gradient of 12 mmHg and no PVL.   She was seen by Dr. Duke Salvia in follow-up in October 2021 and Unna boots were ordered.   Today she presents to  clinic for follow-up. Still has significant LE edema and wearing unna boots. Now sleeping on the couch. She is more sedentary on account of her LE edema. She does walk and down the street and out to [employer]. She is very lonely and stays at home alone. She doesn't have transportation to get to her appts, but we recently [facility] up with [facility] and she was very  grateful. She also has several teeth that need to be pulled but doesn't have the money to pay.   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 Laneta Simmers and Excell Seltzer   Venous insufficiency  Ventral hernia     Past Surgical History:  Procedure Laterality Date   ABDOMINAL HYSTERECTOMY  2001   TAH-BSO   CHOLECYSTECTOMY  2001   COLONOSCOPY  2013   diverticulosis    ESOPHAGOGASTRODUODENOSCOPY  2013   normal    INCISIONAL HERNIA REPAIR     RIGHT/LEFT HEART CATH AND CORONARY ANGIOGRAPHY N/A 05/09/2020   Procedure: RIGHT/LEFT HEART CATH AND CORONARY ANGIOGRAPHY;  Surgeon: Corky Crafts, MD;  Location: [facility] INVASIVE CV LAB;  Service: Cardiovascular;  Laterality: N/A;   TEE WITHOUT CARDIOVERSION N/A 05/17/2020   Procedure: TRANSESOPHAGEAL ECHOCARDIOGRAM (TEE);  Surgeon: Tonny Bollman, MD;  Location: [facility] INVASIVE CV LAB;  Service: Open Heart Surgery;  Laterality: N/A;   TONSILLECTOMY     TRANSCATHETER AORTIC VALVE REPLACEMENT, TRANSFEMORAL N/A 05/17/2020   Procedure: TRANSCATHETER AORTIC VALVE  REPLACEMENT, TRANSFEMORAL;  Surgeon: Tonny Bollman, MD;  Location: [facility] INVASIVE CV LAB;  Service: Open Heart Surgery;  Laterality: N/A;    Current Medications: Current Meds  Medication Sig   acetaminophen (TYLENOL) 325 MG tablet Take 325 mg by mouth daily as needed for mild pain or headache.   amoxicillin (AMOXIL) 500 MG tablet Take 4 tablets (2,000 mg total) by mouth as directed. 1 hour prior to dental work including cleanings   aspirin 81 MG tablet Take 81 mg by mouth daily.   atorvastatin (LIPITOR) 40 MG tablet Take 1 tablet (40 mg total) by mouth daily.   citalopram (CELEXA) 20 MG tablet TAKE 1 TABLET BY MOUTH  DAILY   Continuous Blood Gluc Sensor (FREESTYLE LIBRE SENSOR SYSTEM) MISC Use to monitor sugars   esomeprazole (NEXIUM) 20 MG capsule Take 1 capsule (20 mg total) by mouth daily at 12 noon.   ezetimibe (ZETIA) 10 MG tablet Take 1 tablet (10 mg total) by mouth daily.   gabapentin (NEURONTIN) 300 MG capsule TAKE 1 CAPSULE BY MOUTH 3  TIMES DAILY   glucose blood (ONETOUCH VERIO) test strip USE AS DIRECTED   insulin NPH-regular Human (70-30) 100 UNIT/ML injection Inject 45 Units into the skin 2 (two) times daily with a meal.   Insulin Pen Needle 31G X 8 MM MISC Use to inject insulin four times daily E11.41   metFORMIN (GLUCOPHAGE) 1000 MG tablet TAKE ONE-HALF TABLET BY  MOUTH TWICE DAILY WITH A  MEAL   metoprolol tartrate (LOPRESSOR) 25 MG tablet Take 0.5 tablets (12.5 mg total) by mouth 2 (two) times daily.   nystatin (MYCOSTATIN/NYSTOP) powder Apply 1 application topically 3 (three) times daily.   RYBELSUS 7 MG TABS TAKE 1 TABLET BY MOUTH  DAILY   sodium chloride (OCEAN) 0.65 % SOLN nasal spray Place 1 spray into both nostrils 4 (four) times daily. (Patient taking differently: Place 1 spray into both nostrils 4 (four) times daily as needed for congestion.)   tetrahydrozoline 0.05 % ophthalmic solution Place 1-2 drops into both eyes 2 (two) times daily as needed (Dry eyes).    torsemide (DEMADEX) 20 MG tablet Take 1 tablet (20 mg total) by mouth daily.   Trolamine Salicylate (ASPERCREME EX) Apply 1 application topically daily as needed (shoulder/hips/knee/knuckles).     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 Patient needs to submit further paperwork to complete   [facility]  March 06, 2010 2:35 PM   Financial assistance approved for 100% discount at [facility] and has [facility] card   Rudell Cobb  March 12, 2010 3:55 PM   Social Determinants of Health   Financial Resource Strain: Medium Risk   Difficulty of Paying Living Expenses: Somewhat hard  Food Insecurity: Not on file  Transportation Needs: Unmet Transportation Needs   Lack of Transportation (Medical): Yes   Lack of Transportation (Non-Medical): Yes  Physical Activity: Not on file  Stress: Not on file  Social Connections: Not on file     Family History: The patient's family history includes Breast cancer in her paternal aunt; CVA in her father; Diabetes in her paternal grandmother and sister; Heart failure in her father and mother; Other in her brother and sister; Ovarian cancer in her sister. There is no history of  Colon cancer.  ROS:   Please see the history of present illness.    All other systems reviewed and are negative.  EKGs/Labs/Other Studies Reviewed:    The following studies were reviewed today:  TAVR OPERATIVE NOTE     Date of Procedure:                05/17/2020   Preoperative Diagnosis:      Severe Aortic Stenosis   Postoperative Diagnosis:    Same   Procedure:        Transcatheter Aortic Valve Replacement - Percutaneous Right Transfemoral Approach             Edwards Sapien 3 Ultra THV (size 23 mm, model # 9750TFX, serial # [id number])              Co-Surgeons:                        Alleen Borne, MD and Tonny Bollman, MD     Anesthesiologist:                  Jairo Ben, MD   Echocardiographer:              Lacretia Nicks. O'Neal, MD   Pre-operative Echo Findings: Severe aortic stenosis Normal left ventricular systolic function   Post-operative Echo Findings: No paravalvular leak Normal left ventricular systolic function   _______________________     Echo 05/18/20 IMPRESSIONS   1. 23 mm S3. V max 2.9 m/s, mean gardient 20 mmHG, EOA 1.72 cm2, DI 0.50.  Vmax/gradient slightly higher due to high flow (LVOT VTI 26 cm). Normally  functioning prosthetic TAVR without evidence of paravalvular leak. The  aortic valve has been  repaired/replaced. Aortic valve regurgitation is not visualized. There is  a 23 mm Edwards Sapien prosthetic (TAVR) valve present in the aortic  position. Procedure Date: 05/17/2020.   2. Left  ventricular ejection fraction, by estimation, is 60 to 65%. The  left ventricle has normal function. The left ventricle has no regional  wall motion abnormalities. Left ventricular diastolic parameters are  consistent with Grade II diastolic  dysfunction (pseudonormalization). Elevated left atrial pressure.   3. Right ventricular systolic function is normal. The right ventricular  size is normal. There is normal pulmonary artery systolic pressure. The   estimated right ventricular systolic pressure is 29.8 mmHg.   4. The mitral valve is grossly normal. Trivial mitral valve  regurgitation. No evidence of mitral stenosis.   5. The inferior vena cava is normal in size with greater than 50%  respiratory variability, suggesting right atrial pressure of 3 mmHg.   Comparison(s): Changes from prior study are noted.     ____________________     Echo 06/15/20 IMPRESSIONS   1. Definity used; normal LV systolic function; grade 2 diastolic  dysfunction; mild LVH; s/p TAVR with mean gradient 12 mmHg, AVA 1.1 cm2  and no AI.   2. Left ventricular ejection fraction, by estimation, is 60 to 65%. The  left ventricle has normal function. The left ventricle has no regional  wall motion abnormalities. There is mild left ventricular hypertrophy.  Left ventricular diastolic parameters  are consistent with Grade II diastolic dysfunction (pseudonormalization).  Elevated left atrial pressure.   3. Right ventricular systolic function is normal. The right ventricular  size is normal.   4. The mitral valve is normal in structure. Trivial mitral valve  regurgitation. No evidence of mitral stenosis.   5. The aortic valve has been repaired/replaced. Aortic valve  regurgitation is not visualized. No aortic stenosis is present. There is a  bioprosthetic valve present in the aortic position.   6. The inferior vena cava is normal in size with greater than 50%  respiratory variability, suggesting right atrial pressure of 3 mmHg.   __________________________  Echo 06/07/21 1. The aortic valve has been repaired/replaced. Aortic valve  regurgitation is not visualized. No aortic stenosis is present. There is a  23 mm Sapien prosthetic (TAVR) valve present in the aortic position.  Procedure Date: 05/17/20. Aortic valve mean  gradient measures 18.8 mmHg. Aortic valve Vmax measures 3.04 m/s. Aortic  valve acceleration time measures 70 msec.   2. Left ventricular  ejection fraction, by estimation, is 60 to 65%. The  left ventricle has normal function. The left ventricle has no regional  wall motion abnormalities. There is moderate asymmetric left ventricular  hypertrophy of the basal-septal  segment. Left ventricular diastolic parameters are consistent with Grade  II diastolic dysfunction (pseudonormalization).   3. Right ventricular systolic function is normal. The right ventricular  size is normal. Tricuspid regurgitation signal is inadequate for assessing  PA pressure.   4. Left atrial size was mildly dilated.   5. The mitral valve is normal in structure. Trivial mitral valve  regurgitation. No evidence of mitral stenosis.   6. The inferior vena cava is normal in size with greater than 50%  respiratory variability, suggesting right atrial pressure of 3 mmHg.   Comparison(s): A prior study was performed on 06/15/20. Prior images  reviewed side by side. On review of both exams, the stroke volume has  increased on today's study. Findings most consistent with patient  prosthesis mismatch which may be more evident today   with higher SV.   EKG:  EKG is NOT ordered today.    Recent Labs: 02/22/2021: Pro B Natriuretic peptide (BNP) 55.0 03/14/2021: ALT 20; B Natriuretic  Peptide 116.8 03/15/2021: Hemoglobin 12.7; Platelets 201 03/18/2021: BUN 27; Creatinine, Ser 0.76; Magnesium 2.8; Potassium 4.0; Sodium 136  Recent Lipid Panel    Component Value Date/Time   CHOL 175 02/22/2021 1536   TRIG 155.0 (H) 02/22/2021 1536   HDL 36.80 (L) 02/22/2021 1536   CHOLHDL 5 02/22/2021 1536   VLDL 31.0 02/22/2021 1536   LDLCALC 107 (H) 02/22/2021 1536   LDLDIRECT 110.0 11/12/2018 1419     Risk Assessment/Calculations:       Physical Exam:    VS:  BP 130/68   Pulse 86   Ht  (1.626 m)   Wt 182 lb (82.6 kg)   SpO2 97%   BMI 31.24 kg/m     Wt Readings from Last 3 Encounters:  06/07/21 182 lb (82.6 kg)  03/22/21 163 lb 9.6 oz (74.2 kg)  03/14/21  193 lb (87.5 kg)     GEN:  Well nourished, well developed in no acute distress HEENT: Normal NECK: No JVD; No carotid bruits LYMPHATICS: No lymphadenopathy CARDIAC: RRR, no murmurs, rubs, gallops RESPIRATORY:  Clear to auscultation without rales, wheezing or rhonchi  ABDOMEN: Soft, non-tender, non-distended MUSCULOSKELETAL: Massive bilateral LE edema with unna boots SKIN: Warm and dry NEUROLOGIC:  Alert and oriented x 3 PSYCHIATRIC:  Normal affect   ASSESSMENT:    1. S/P TAVR (transcatheter aortic valve replacement)   2. Lymphedema   3. Essential hypertension   4. Hyperlipidemia, unspecified hyperlipidemia type    PLAN:    In order of problems listed above:  Severe AS s/p TAVR: echo today shows EF 60%, normally functioning TAVR with a mean gradient of 18.8 mm hg and no PVL. A prior study was performed on 06/15/20. Prior images reviewed side by side by Dr. Jacques Navy. On review of both exams, the stroke volume has increased on today's study. Findings most consistent with patient prosthesis mismatch which may be more evident today with higher SV. Initially I was going to repeat an echo in 6 months, but I don;t think that is necessary after echo read. This has been cancelled and pt made aware. She has NYHA class II symptoms, but not very active; mostly related to LE lymphedema. Continue on aspirin. SBE prophylaxis discussed; I have RX'd amoxicillin. She has 7 teeth left that need to be pulled but saving up money.   Chronic lower extremity edema: Lasix did not really help with this.  She is now has a prescription for [employer].   HTN: BP well controlled today. No changes made   HLD: continue statin    Medication Adjustments/Labs and Tests Ordered: Current medicines are reviewed at length with the patient today.  Concerns regarding medicines are outlined above.  Orders Placed This Encounter  Procedures   ECHOCARDIOGRAM COMPLETE    Meds ordered this encounter  Medications    amoxicillin (AMOXIL) 500 MG tablet    Sig: Take 4 tablets (2,000 mg total) by mouth as directed. 1 hour prior to dental work including cleanings    Dispense:  12 tablet    Refill:  12    Order Specific Question:   Supervising Provider    Answer:   Tonny Bollman [[id number]]    Patient Instructions  Medication Instructions:  Your physician recommends that you continue on your current medications as directed. Please refer to the Current Medication list given to you today.  *If you need a refill on your cardiac medications before your next appointment, please call your pharmacy*   Lab Work:  None ordered   Testing/Procedures: Your physician has requested that you have an echocardiogram in 6 months (prior to your follow up with Dr. Duke Salvia). Echocardiography is a painless test that uses sound waves to create images of your heart. It provides your doctor with information about the size and shape of your heart and how well your heart's chambers and valves are working. This procedure takes approximately one hour. There are no restrictions for this procedure.    Follow-Up: At [facility], you and your health needs are our priority.  As part of our continuing mission to provide you with exceptional heart care, we have created designated Provider Care Teams.  These Care Teams include your primary Cardiologist (physician) and Advanced Practice Providers (APPs -  Physician Assistants and Nurse Practitioners) who all work together to provide you with the care you need, when you need it.  We recommend signing up for the patient portal called "MyChart".  Sign up information is provided on this After Visit Summary.  MyChart is used to connect with patients for Virtual Visits (Telemedicine).  Patients are able to view lab/test results, encounter notes, upcoming appointments, etc.  Non-urgent messages can be sent to your provider as well.   To learn more about what you can do with MyChart, go to  [email or web address].    Your next appointment:   7 month(s) (after your echocardiogram has been completed)  The format for your next appointment:   In Person  Provider:   Chilton Si, MD    Thank you for choosing [facility]!!     Signed, Cline Crock, PA-C  06/07/2021 7:47 PM    [facility]

## 2021-06-08 DIAGNOSIS — L03115 Cellulitis of right lower limb: Secondary | ICD-10-CM | POA: Diagnosis not present

## 2021-06-08 DIAGNOSIS — I2510 Atherosclerotic heart disease of native coronary artery without angina pectoris: Secondary | ICD-10-CM | POA: Diagnosis not present

## 2021-06-08 DIAGNOSIS — I5032 Chronic diastolic (congestive) heart failure: Secondary | ICD-10-CM | POA: Diagnosis not present

## 2021-06-08 DIAGNOSIS — I110 Hypertensive heart disease with heart failure: Secondary | ICD-10-CM | POA: Diagnosis not present

## 2021-06-08 DIAGNOSIS — L03116 Cellulitis of left lower limb: Secondary | ICD-10-CM | POA: Diagnosis not present

## 2021-06-11 DIAGNOSIS — I2510 Atherosclerotic heart disease of native coronary artery without angina pectoris: Secondary | ICD-10-CM | POA: Diagnosis not present

## 2021-06-11 DIAGNOSIS — L03116 Cellulitis of left lower limb: Secondary | ICD-10-CM | POA: Diagnosis not present

## 2021-06-11 DIAGNOSIS — I110 Hypertensive heart disease with heart failure: Secondary | ICD-10-CM | POA: Diagnosis not present

## 2021-06-11 DIAGNOSIS — I5032 Chronic diastolic (congestive) heart failure: Secondary | ICD-10-CM | POA: Diagnosis not present

## 2021-06-11 DIAGNOSIS — L03115 Cellulitis of right lower limb: Secondary | ICD-10-CM | POA: Diagnosis not present

## 2021-06-12 ENCOUNTER — Ambulatory Visit (INDEPENDENT_AMBULATORY_CARE_PROVIDER_SITE_OTHER): Payer: Medicare Other | Admitting: Internal Medicine

## 2021-06-12 ENCOUNTER — Other Ambulatory Visit: Payer: Self-pay

## 2021-06-12 ENCOUNTER — Encounter: Payer: Self-pay | Admitting: Internal Medicine

## 2021-06-12 VITALS — BP 136/68 | HR 54 | Temp 97.7°F | Resp 18 | Ht 64.0 in | Wt 183.8 lb

## 2021-06-12 DIAGNOSIS — E785 Hyperlipidemia, unspecified: Secondary | ICD-10-CM | POA: Diagnosis not present

## 2021-06-12 DIAGNOSIS — M25511 Pain in right shoulder: Secondary | ICD-10-CM

## 2021-06-12 DIAGNOSIS — I872 Venous insufficiency (chronic) (peripheral): Secondary | ICD-10-CM

## 2021-06-12 DIAGNOSIS — G8929 Other chronic pain: Secondary | ICD-10-CM

## 2021-06-12 DIAGNOSIS — E1165 Type 2 diabetes mellitus with hyperglycemia: Secondary | ICD-10-CM | POA: Diagnosis not present

## 2021-06-12 DIAGNOSIS — E1149 Type 2 diabetes mellitus with other diabetic neurological complication: Secondary | ICD-10-CM | POA: Diagnosis not present

## 2021-06-12 DIAGNOSIS — E1169 Type 2 diabetes mellitus with other specified complication: Secondary | ICD-10-CM

## 2021-06-12 DIAGNOSIS — M25512 Pain in left shoulder: Secondary | ICD-10-CM

## 2021-06-12 DIAGNOSIS — IMO0002 Reserved for concepts with insufficient information to code with codable children: Secondary | ICD-10-CM

## 2021-06-12 LAB — CBC
HCT: 40.1 % (ref 36.0–46.0)
Hemoglobin: 13.4 g/dL (ref 12.0–15.0)
MCHC: 33.4 g/dL (ref 30.0–36.0)
MCV: 82.6 fl (ref 78.0–100.0)
Platelets: 238 10*3/uL (ref 150.0–400.0)
RBC: 4.86 Mil/uL (ref 3.87–5.11)
RDW: 14.7 % (ref 11.5–15.5)
WBC: 7.7 10*3/uL (ref 4.0–10.5)

## 2021-06-12 LAB — COMPREHENSIVE METABOLIC PANEL
ALT: 45 U/L — ABNORMAL HIGH (ref 0–35)
AST: 25 U/L (ref 0–37)
Albumin: 3.8 g/dL (ref 3.5–5.2)
Alkaline Phosphatase: 179 U/L — ABNORMAL HIGH (ref 39–117)
BUN: 29 mg/dL — ABNORMAL HIGH (ref 6–23)
CO2: 34 mEq/L — ABNORMAL HIGH (ref 19–32)
Calcium: 9.4 mg/dL (ref 8.4–10.5)
Chloride: 94 mEq/L — ABNORMAL LOW (ref 96–112)
Creatinine, Ser: 1.05 mg/dL (ref 0.40–1.20)
GFR: 52.42 mL/min — ABNORMAL LOW (ref >60.00)
Glucose, Bld: 222 mg/dL — ABNORMAL HIGH (ref 70–99)
Potassium: 4.9 mEq/L (ref 3.5–5.1)
Sodium: 134 mEq/L — ABNORMAL LOW (ref 135–145)
Total Bilirubin: 0.4 mg/dL (ref 0.2–1.2)
Total Protein: 7.6 g/dL (ref 6.0–8.3)

## 2021-06-12 LAB — LIPID PANEL
Cholesterol: 180 mg/dL (ref 0–200)
HDL: 44.6 mg/dL (ref >39.00)
LDL Cholesterol: 99 mg/dL (ref 0–99)
NonHDL: 135
Total CHOL/HDL Ratio: 4
Triglycerides: 180 mg/dL — ABNORMAL HIGH (ref 0.0–149.0)
VLDL: 36 mg/dL (ref 0.0–40.0)

## 2021-06-12 LAB — HEMOGLOBIN A1C: Hgb A1c MFr Bld: 9.8 % — ABNORMAL HIGH (ref 4.6–6.5)

## 2021-06-12 MED ORDER — RYBELSUS 14 MG PO TABS: 14.0000 mg | ORAL_TABLET | Freq: Every day | ORAL | 3 refills | Status: DC

## 2021-06-12 MED ORDER — TORSEMIDE 20 MG PO TABS: 20.0000 mg | ORAL_TABLET | Freq: Two times a day (BID) | ORAL | 3 refills | Status: DC

## 2021-06-12 NOTE — Patient Instructions (Signed)
We will check the labs today.   We have sent in a new prescription for the torsemide to take up to 2 pills a day.

## 2021-06-12 NOTE — Progress Notes (Signed)
   Subjective:   Patient ID: [name], female    DOB: 05/20/1946, 75 y.o.   MRN: [id number]  HPI The patient is a 75 YO female coming in for follow up leg swelling (using una boots at times, this is helping some, they are stable overall and weight stable) and diabetes (sugars still above goal, is willing to make changes to help, having diarrhea from metformin but want to keep taking to help her sugars, taking rybelsus without problems) and shoulder pain (going on for a month or so, overall worsening, denies injury or overuse, )  Review of Systems  Constitutional:  Positive for activity change and fatigue.  HENT: Negative.    Eyes: Negative.   Respiratory:  Negative for cough, chest tightness and shortness of breath.   Cardiovascular:  Positive for leg swelling. Negative for chest pain and palpitations.  Gastrointestinal:  Positive for diarrhea. Negative for abdominal distention, abdominal pain, constipation, nausea and vomiting.  Musculoskeletal:  Positive for arthralgias and myalgias.  Skin: Negative.   Neurological:  Positive for numbness.  Psychiatric/Behavioral: Negative.     Objective:  Physical Exam Constitutional:      Appearance: She is well-developed. She is obese.  HENT:     Head: Normocephalic and atraumatic.  Cardiovascular:     Rate and Rhythm: Normal rate and regular rhythm.  Pulmonary:     Effort: Pulmonary effort is normal. No respiratory distress.     Breath sounds: Normal breath sounds. No wheezing or rales.  Abdominal:     General: Bowel sounds are normal. There is no distension.     Palpations: Abdomen is soft.     Tenderness: There is no abdominal tenderness. There is no rebound.  Musculoskeletal:        General: Tenderness present.     Cervical back: Normal range of motion.     Right lower leg: Edema present.     Left lower leg: Edema present.  Skin:    General: Skin is warm and dry.  Neurological:     Mental Status: She is alert and  oriented to person, place, and time. Mental status is at baseline.     Coordination: Coordination abnormal.     Comments: Slow gait    Vitals:   06/12/21 1357  BP: 136/68  Pulse: (!) 54  Resp: 18  Temp: 97.7 F (36.5 C)  TempSrc: Oral  SpO2: 96%  Weight: 183 lb 12.8 oz (83.4 kg)  Height: 5\' 4"  (1.626 m)    This visit occurred during the SARS-CoV-2 public health emergency.  Safety protocols were in place, including screening questions prior to the visit, additional usage of staff PPE, and extensive cleaning of exam room while observing appropriate contact time as indicated for disinfecting solutions.   Assessment & Plan:

## 2021-06-13 DIAGNOSIS — I2510 Atherosclerotic heart disease of native coronary artery without angina pectoris: Secondary | ICD-10-CM | POA: Diagnosis not present

## 2021-06-13 DIAGNOSIS — L03116 Cellulitis of left lower limb: Secondary | ICD-10-CM | POA: Diagnosis not present

## 2021-06-13 DIAGNOSIS — I110 Hypertensive heart disease with heart failure: Secondary | ICD-10-CM | POA: Diagnosis not present

## 2021-06-13 DIAGNOSIS — I5032 Chronic diastolic (congestive) heart failure: Secondary | ICD-10-CM | POA: Diagnosis not present

## 2021-06-13 DIAGNOSIS — L03115 Cellulitis of right lower limb: Secondary | ICD-10-CM | POA: Diagnosis not present

## 2021-06-14 DIAGNOSIS — S81801D Unspecified open wound, right lower leg, subsequent encounter: Secondary | ICD-10-CM | POA: Diagnosis not present

## 2021-06-15 NOTE — Assessment & Plan Note (Signed)
Ok with increase to torsemide 1-2 pills daily. New rx sent.

## 2021-06-15 NOTE — Assessment & Plan Note (Signed)
Increasing dose of rybelsus from 7 mg daily to 14 mg daily to help with control. Keep metformin and nph insulin. Depending on results we can add another agent if needed. She does have complications.

## 2021-06-15 NOTE — Assessment & Plan Note (Signed)
Can take tylenol for pain or otc topical. Offered PT and she will think about it.

## 2021-06-15 NOTE — Assessment & Plan Note (Signed)
Checking lipid panel as due. Taking lipitor and zetia. Adjust as needed.

## 2021-06-18 DIAGNOSIS — I2510 Atherosclerotic heart disease of native coronary artery without angina pectoris: Secondary | ICD-10-CM | POA: Diagnosis not present

## 2021-06-18 DIAGNOSIS — I5032 Chronic diastolic (congestive) heart failure: Secondary | ICD-10-CM | POA: Diagnosis not present

## 2021-06-18 DIAGNOSIS — I110 Hypertensive heart disease with heart failure: Secondary | ICD-10-CM | POA: Diagnosis not present

## 2021-06-18 DIAGNOSIS — L03116 Cellulitis of left lower limb: Secondary | ICD-10-CM | POA: Diagnosis not present

## 2021-06-18 DIAGNOSIS — L03115 Cellulitis of right lower limb: Secondary | ICD-10-CM | POA: Diagnosis not present

## 2021-06-19 ENCOUNTER — Telehealth: Payer: Self-pay | Admitting: Internal Medicine

## 2021-06-19 NOTE — Telephone Encounter (Signed)
   Patient is requesting Semaglutide (RYBELSUS) 14 MG TABS and torsemide (DEMADEX) 20 MG tablet be sent to [facility]  [facility]) - [place], [place] - [phone] [address]

## 2021-06-19 NOTE — Telephone Encounter (Signed)
Both medications were sent to [facility] on July 5th.

## 2021-06-21 DIAGNOSIS — I110 Hypertensive heart disease with heart failure: Secondary | ICD-10-CM | POA: Diagnosis not present

## 2021-06-21 DIAGNOSIS — L03115 Cellulitis of right lower limb: Secondary | ICD-10-CM | POA: Diagnosis not present

## 2021-06-21 DIAGNOSIS — I2510 Atherosclerotic heart disease of native coronary artery without angina pectoris: Secondary | ICD-10-CM | POA: Diagnosis not present

## 2021-06-21 DIAGNOSIS — L03116 Cellulitis of left lower limb: Secondary | ICD-10-CM | POA: Diagnosis not present

## 2021-06-21 DIAGNOSIS — I5032 Chronic diastolic (congestive) heart failure: Secondary | ICD-10-CM | POA: Diagnosis not present

## 2021-06-25 DIAGNOSIS — I5032 Chronic diastolic (congestive) heart failure: Secondary | ICD-10-CM | POA: Diagnosis not present

## 2021-06-25 DIAGNOSIS — L03116 Cellulitis of left lower limb: Secondary | ICD-10-CM | POA: Diagnosis not present

## 2021-06-25 DIAGNOSIS — L03115 Cellulitis of right lower limb: Secondary | ICD-10-CM | POA: Diagnosis not present

## 2021-06-25 DIAGNOSIS — I110 Hypertensive heart disease with heart failure: Secondary | ICD-10-CM | POA: Diagnosis not present

## 2021-06-25 DIAGNOSIS — I2510 Atherosclerotic heart disease of native coronary artery without angina pectoris: Secondary | ICD-10-CM | POA: Diagnosis not present

## 2021-06-28 DIAGNOSIS — L03116 Cellulitis of left lower limb: Secondary | ICD-10-CM | POA: Diagnosis not present

## 2021-06-28 DIAGNOSIS — I110 Hypertensive heart disease with heart failure: Secondary | ICD-10-CM | POA: Diagnosis not present

## 2021-06-28 DIAGNOSIS — I2510 Atherosclerotic heart disease of native coronary artery without angina pectoris: Secondary | ICD-10-CM | POA: Diagnosis not present

## 2021-06-28 DIAGNOSIS — I5032 Chronic diastolic (congestive) heart failure: Secondary | ICD-10-CM | POA: Diagnosis not present

## 2021-06-28 DIAGNOSIS — L03115 Cellulitis of right lower limb: Secondary | ICD-10-CM | POA: Diagnosis not present

## 2021-07-02 ENCOUNTER — Other Ambulatory Visit: Payer: Self-pay | Admitting: Physician Assistant

## 2021-07-02 DIAGNOSIS — I110 Hypertensive heart disease with heart failure: Secondary | ICD-10-CM | POA: Diagnosis not present

## 2021-07-02 DIAGNOSIS — I2510 Atherosclerotic heart disease of native coronary artery without angina pectoris: Secondary | ICD-10-CM | POA: Diagnosis not present

## 2021-07-02 DIAGNOSIS — L03115 Cellulitis of right lower limb: Secondary | ICD-10-CM | POA: Diagnosis not present

## 2021-07-02 DIAGNOSIS — I5032 Chronic diastolic (congestive) heart failure: Secondary | ICD-10-CM | POA: Diagnosis not present

## 2021-07-02 DIAGNOSIS — L03116 Cellulitis of left lower limb: Secondary | ICD-10-CM | POA: Diagnosis not present

## 2021-07-04 ENCOUNTER — Telehealth: Payer: Self-pay

## 2021-07-04 DIAGNOSIS — I110 Hypertensive heart disease with heart failure: Secondary | ICD-10-CM | POA: Diagnosis not present

## 2021-07-04 DIAGNOSIS — I2510 Atherosclerotic heart disease of native coronary artery without angina pectoris: Secondary | ICD-10-CM | POA: Diagnosis not present

## 2021-07-04 DIAGNOSIS — I5032 Chronic diastolic (congestive) heart failure: Secondary | ICD-10-CM | POA: Diagnosis not present

## 2021-07-04 DIAGNOSIS — L03115 Cellulitis of right lower limb: Secondary | ICD-10-CM | POA: Diagnosis not present

## 2021-07-04 DIAGNOSIS — L03116 Cellulitis of left lower limb: Secondary | ICD-10-CM | POA: Diagnosis not present

## 2021-07-04 NOTE — Telephone Encounter (Signed)
Mary from [facility] called Patient has been very non complaint since April. Frances Furbish wants to work on her learning how to put juxiet wrap and then discharge her due to non compliance.   BS 68  [phone]

## 2021-07-06 NOTE — Telephone Encounter (Signed)
Noted  

## 2021-07-09 ENCOUNTER — Observation Stay (HOSPITAL_COMMUNITY)
Admission: EM | Admit: 2021-07-09 | Discharge: 2021-07-13 | Disposition: A | Discharge status: Home / Self Care | Payer: Medicare Other | Attending: Internal Medicine | Admitting: Internal Medicine

## 2021-07-09 ENCOUNTER — Observation Stay (HOSPITAL_COMMUNITY): Payer: Medicare Other

## 2021-07-09 ENCOUNTER — Encounter (HOSPITAL_COMMUNITY): Payer: Self-pay

## 2021-07-09 ENCOUNTER — Other Ambulatory Visit: Payer: Self-pay

## 2021-07-09 DIAGNOSIS — Z7982 Long term (current) use of aspirin: Secondary | ICD-10-CM | POA: Diagnosis not present

## 2021-07-09 DIAGNOSIS — E119 Type 2 diabetes mellitus without complications: Secondary | ICD-10-CM | POA: Diagnosis not present

## 2021-07-09 DIAGNOSIS — R269 Unspecified abnormalities of gait and mobility: Secondary | ICD-10-CM | POA: Insufficient documentation

## 2021-07-09 DIAGNOSIS — I110 Hypertensive heart disease with heart failure: Secondary | ICD-10-CM | POA: Insufficient documentation

## 2021-07-09 DIAGNOSIS — D72829 Elevated white blood cell count, unspecified: Secondary | ICD-10-CM | POA: Insufficient documentation

## 2021-07-09 DIAGNOSIS — R404 Transient alteration of awareness: Secondary | ICD-10-CM | POA: Diagnosis not present

## 2021-07-09 DIAGNOSIS — R42 Dizziness and giddiness: Secondary | ICD-10-CM | POA: Diagnosis not present

## 2021-07-09 DIAGNOSIS — W19XXXA Unspecified fall, initial encounter: Secondary | ICD-10-CM | POA: Diagnosis not present

## 2021-07-09 DIAGNOSIS — I5032 Chronic diastolic (congestive) heart failure: Secondary | ICD-10-CM | POA: Diagnosis not present

## 2021-07-09 DIAGNOSIS — Z743 Need for continuous supervision: Secondary | ICD-10-CM | POA: Diagnosis not present

## 2021-07-09 DIAGNOSIS — I499 Cardiac arrhythmia, unspecified: Secondary | ICD-10-CM | POA: Diagnosis not present

## 2021-07-09 DIAGNOSIS — E8809 Other disorders of plasma-protein metabolism, not elsewhere classified: Secondary | ICD-10-CM | POA: Insufficient documentation

## 2021-07-09 DIAGNOSIS — Z20822 Contact with and (suspected) exposure to covid-19: Secondary | ICD-10-CM | POA: Insufficient documentation

## 2021-07-09 DIAGNOSIS — R531 Weakness: Secondary | ICD-10-CM | POA: Diagnosis not present

## 2021-07-09 DIAGNOSIS — Y92009 Unspecified place in unspecified non-institutional (private) residence as the place of occurrence of the external cause: Secondary | ICD-10-CM | POA: Diagnosis not present

## 2021-07-09 DIAGNOSIS — R52 Pain, unspecified: Secondary | ICD-10-CM

## 2021-07-09 DIAGNOSIS — L89150 Pressure ulcer of sacral region, unstageable: Secondary | ICD-10-CM | POA: Insufficient documentation

## 2021-07-09 DIAGNOSIS — IMO0002 Reserved for concepts with insufficient information to code with codable children: Secondary | ICD-10-CM

## 2021-07-09 DIAGNOSIS — I10 Essential (primary) hypertension: Secondary | ICD-10-CM | POA: Insufficient documentation

## 2021-07-09 DIAGNOSIS — Z79899 Other long term (current) drug therapy: Secondary | ICD-10-CM | POA: Insufficient documentation

## 2021-07-09 DIAGNOSIS — R55 Syncope and collapse: Secondary | ICD-10-CM

## 2021-07-09 DIAGNOSIS — Z23 Encounter for immunization: Secondary | ICD-10-CM | POA: Insufficient documentation

## 2021-07-09 DIAGNOSIS — Z7984 Long term (current) use of oral hypoglycemic drugs: Secondary | ICD-10-CM | POA: Diagnosis not present

## 2021-07-09 DIAGNOSIS — Z794 Long term (current) use of insulin: Secondary | ICD-10-CM | POA: Insufficient documentation

## 2021-07-09 DIAGNOSIS — Z043 Encounter for examination and observation following other accident: Secondary | ICD-10-CM | POA: Diagnosis not present

## 2021-07-09 DIAGNOSIS — E1149 Type 2 diabetes mellitus with other diabetic neurological complication: Secondary | ICD-10-CM

## 2021-07-09 LAB — CBC WITH DIFFERENTIAL/PLATELET
Abs Immature Granulocytes: 0.18 10*3/uL — ABNORMAL HIGH (ref 0.00–0.07)
Basophils Absolute: 0.1 10*3/uL (ref 0.0–0.1)
Basophils Relative: 1 %
Eosinophils Absolute: 0.2 10*3/uL (ref 0.0–0.5)
Eosinophils Relative: 1 %
HCT: 42 % (ref 36.0–46.0)
Hemoglobin: 13.9 g/dL (ref 12.0–15.0)
Immature Granulocytes: 1 %
Lymphocytes Relative: 9 %
Lymphs Abs: 1.2 10*3/uL (ref 0.7–4.0)
MCH: 28.1 pg (ref 26.0–34.0)
MCHC: 33.1 g/dL (ref 30.0–36.0)
MCV: 84.8 fL (ref 80.0–100.0)
Monocytes Absolute: 1.3 10*3/uL — ABNORMAL HIGH (ref 0.1–1.0)
Monocytes Relative: 10 %
Neutro Abs: 10.3 10*3/uL — ABNORMAL HIGH (ref 1.7–7.7)
Neutrophils Relative %: 78 %
Platelets: 216 10*3/uL (ref 150–400)
RBC: 4.95 MIL/uL (ref 3.87–5.11)
RDW: 13.5 % (ref 11.5–15.5)
WBC: 13.2 10*3/uL — ABNORMAL HIGH (ref 4.0–10.5)
nRBC: 0 % (ref 0.0–0.2)

## 2021-07-09 LAB — COMPREHENSIVE METABOLIC PANEL
ALT: 21 U/L (ref 0–44)
AST: 22 U/L (ref 15–41)
Albumin: 2.4 g/dL — ABNORMAL LOW (ref 3.5–5.0)
Alkaline Phosphatase: 128 U/L — ABNORMAL HIGH (ref 38–126)
Anion gap: 12 (ref 5–15)
BUN: 20 mg/dL (ref 8–23)
CO2: 23 mmol/L (ref 22–32)
Calcium: 8.9 mg/dL (ref 8.9–10.3)
Chloride: 100 mmol/L (ref 98–111)
Creatinine, Ser: 0.81 mg/dL (ref 0.44–1.00)
GFR, Estimated: >60 mL/min (ref >60)
Glucose, Bld: 196 mg/dL — ABNORMAL HIGH (ref 70–99)
Potassium: 3.7 mmol/L (ref 3.5–5.1)
Sodium: 135 mmol/L (ref 135–145)
Total Bilirubin: 2 mg/dL — ABNORMAL HIGH (ref 0.3–1.2)
Total Protein: 6.3 g/dL — ABNORMAL LOW (ref 6.5–8.1)

## 2021-07-09 LAB — RESP PANEL BY RT-PCR (FLU A&B, COVID) ARPGX2
Influenza A by PCR: NEGATIVE
Influenza B by PCR: NEGATIVE
SARS Coronavirus 2 by RT PCR: NEGATIVE

## 2021-07-09 LAB — LACTIC ACID, PLASMA
Lactic Acid, Venous: 1.2 mmol/L (ref 0.5–1.9)
Lactic Acid, Venous: 1.8 mmol/L (ref 0.5–1.9)

## 2021-07-09 LAB — CBG MONITORING, ED: Glucose-Capillary: 274 mg/dL — ABNORMAL HIGH (ref 70–99)

## 2021-07-09 LAB — TSH: TSH: 4.436 u[IU]/mL (ref 0.350–4.500)

## 2021-07-09 LAB — VITAMIN B12: Vitamin B-12: 476 pg/mL (ref 180–914)

## 2021-07-09 LAB — GLUCOSE, CAPILLARY: Glucose-Capillary: 277 mg/dL — ABNORMAL HIGH (ref 70–99)

## 2021-07-09 LAB — CK: Total CK: 133 U/L (ref 38–234)

## 2021-07-09 MED ORDER — SEMAGLUTIDE 14 MG PO TABS: 14.0000 mg | ORAL_TABLET | Freq: Every day | ORAL | Status: DC

## 2021-07-09 MED ORDER — MUSCLE RUB 10-15 % EX CREA
1.0000 "application " | TOPICAL_CREAM | Freq: Every day | CUTANEOUS | Status: DC | PRN
  Administered 2021-07-13: 1 via TOPICAL
  Filled 2021-07-09: qty 85

## 2021-07-09 MED ORDER — TORSEMIDE 20 MG PO TABS
20.0000 mg | ORAL_TABLET | Freq: Two times a day (BID) | ORAL | Status: DC
  Administered 2021-07-10: 20 mg via ORAL
  Filled 2021-07-09 (×3): qty 1

## 2021-07-09 MED ORDER — PANTOPRAZOLE SODIUM 40 MG PO TBEC
40.0000 mg | DELAYED_RELEASE_TABLET | Freq: Every day | ORAL | Status: DC
  Administered 2021-07-09 – 2021-07-13 (×5): 40 mg via ORAL
  Filled 2021-07-09 (×5): qty 1

## 2021-07-09 MED ORDER — ACETAMINOPHEN 325 MG PO TABS: 325.0000 mg | ORAL_TABLET | Freq: Every day | ORAL | Status: DC | PRN

## 2021-07-09 MED ORDER — NAPHAZOLINE-GLYCERIN 0.012-0.2 % OP SOLN
1.0000 [drp] | Freq: Four times a day (QID) | OPHTHALMIC | Status: DC | PRN
Start: 2021-07-09 — End: 2021-07-09

## 2021-07-09 MED ORDER — INSULIN ASPART PROT & ASPART (70-30 MIX) 100 UNIT/ML ~~LOC~~ SUSP
30.0000 [IU] | Freq: Two times a day (BID) | SUBCUTANEOUS | Status: DC
  Administered 2021-07-09 – 2021-07-10 (×3): 30 [IU] via SUBCUTANEOUS
  Filled 2021-07-09: qty 10

## 2021-07-09 MED ORDER — EZETIMIBE 10 MG PO TABS
10.0000 mg | ORAL_TABLET | Freq: Every day | ORAL | Status: DC
  Administered 2021-07-10 – 2021-07-13 (×4): 10 mg via ORAL
  Filled 2021-07-09 (×4): qty 1

## 2021-07-09 MED ORDER — ACETAMINOPHEN 650 MG RE SUPP: 650.0000 mg | Freq: Four times a day (QID) | RECTAL | Status: DC | PRN

## 2021-07-09 MED ORDER — SALINE SPRAY 0.65 % NA SOLN
1.0000 | Freq: Four times a day (QID) | NASAL | Status: DC | PRN
  Filled 2021-07-09: qty 44

## 2021-07-09 MED ORDER — ONDANSETRON HCL 4 MG PO TABS: 4.0000 mg | ORAL_TABLET | Freq: Four times a day (QID) | ORAL | Status: DC | PRN

## 2021-07-09 MED ORDER — ASPIRIN EC 81 MG PO TBEC
81.0000 mg | DELAYED_RELEASE_TABLET | Freq: Every day | ORAL | Status: DC
  Administered 2021-07-09 – 2021-07-13 (×5): 81 mg via ORAL
  Filled 2021-07-09 (×5): qty 1

## 2021-07-09 MED ORDER — ONDANSETRON HCL 4 MG/2ML IJ SOLN: 4.0000 mg | Freq: Four times a day (QID) | INTRAMUSCULAR | Status: DC | PRN

## 2021-07-09 MED ORDER — LACTATED RINGERS IV BOLUS
1000.0000 mL | Freq: Once | INTRAVENOUS | Status: AC
  Administered 2021-07-09: 1000 mL via INTRAVENOUS

## 2021-07-09 MED ORDER — ATORVASTATIN CALCIUM 40 MG PO TABS
40.0000 mg | ORAL_TABLET | Freq: Every day | ORAL | Status: DC
  Administered 2021-07-09 – 2021-07-13 (×5): 40 mg via ORAL
  Filled 2021-07-09 (×5): qty 1

## 2021-07-09 MED ORDER — ACETAMINOPHEN 325 MG PO TABS
650.0000 mg | ORAL_TABLET | Freq: Four times a day (QID) | ORAL | Status: DC | PRN
  Administered 2021-07-11 – 2021-07-13 (×5): 650 mg via ORAL
  Filled 2021-07-09 (×7): qty 2

## 2021-07-09 MED ORDER — POTASSIUM CHLORIDE CRYS ER 20 MEQ PO TBCR
20.0000 meq | EXTENDED_RELEASE_TABLET | Freq: Every day | ORAL | Status: DC
  Administered 2021-07-09 – 2021-07-13 (×5): 20 meq via ORAL
  Filled 2021-07-09 (×5): qty 1

## 2021-07-09 MED ORDER — METOPROLOL TARTRATE 12.5 MG HALF TABLET
12.5000 mg | ORAL_TABLET | Freq: Two times a day (BID) | ORAL | Status: DC
  Administered 2021-07-09 – 2021-07-13 (×7): 12.5 mg via ORAL
  Filled 2021-07-09 (×9): qty 1

## 2021-07-09 MED ORDER — GABAPENTIN 300 MG PO CAPS
300.0000 mg | ORAL_CAPSULE | Freq: Three times a day (TID) | ORAL | Status: DC
  Administered 2021-07-09 – 2021-07-13 (×13): 300 mg via ORAL
  Filled 2021-07-09 (×13): qty 1

## 2021-07-09 MED ORDER — INSULIN ASPART 100 UNIT/ML IJ SOLN
0.0000 [IU] | Freq: Three times a day (TID) | INTRAMUSCULAR | Status: DC
  Administered 2021-07-10: 2 [IU] via SUBCUTANEOUS
  Administered 2021-07-11: 3 [IU] via SUBCUTANEOUS
  Administered 2021-07-12: 2 [IU] via SUBCUTANEOUS
  Administered 2021-07-12: 3 [IU] via SUBCUTANEOUS
  Administered 2021-07-13 (×2): 5 [IU] via SUBCUTANEOUS

## 2021-07-09 MED ORDER — ENOXAPARIN SODIUM 40 MG/0.4ML IJ SOSY
40.0000 mg | PREFILLED_SYRINGE | Freq: Every day | INTRAMUSCULAR | Status: DC
  Administered 2021-07-09 – 2021-07-13 (×5): 40 mg via SUBCUTANEOUS
  Filled 2021-07-09 (×6): qty 0.4

## 2021-07-09 MED ORDER — CITALOPRAM HYDROBROMIDE 20 MG PO TABS
20.0000 mg | ORAL_TABLET | Freq: Every day | ORAL | Status: DC
  Administered 2021-07-09 – 2021-07-13 (×5): 20 mg via ORAL
  Filled 2021-07-09 (×4): qty 1
  Filled 2021-07-09: qty 2

## 2021-07-09 MED ORDER — METFORMIN HCL 500 MG PO TABS
500.0000 mg | ORAL_TABLET | Freq: Two times a day (BID) | ORAL | Status: DC
  Administered 2021-07-10 – 2021-07-13 (×7): 500 mg via ORAL
  Filled 2021-07-09 (×8): qty 1

## 2021-07-09 NOTE — Progress Notes (Signed)
Attempted to do orthostatic v/s but pt unable to stand on her legs. Bilateral legs are red and swollen.

## 2021-07-09 NOTE — ED Notes (Signed)
Patient transported to X-ray 

## 2021-07-09 NOTE — ED Notes (Signed)
Report attempted x 1

## 2021-07-09 NOTE — ED Notes (Signed)
Pt returned from X-ray. Meal tray ordered for pt

## 2021-07-09 NOTE — H&P (Signed)
History and Physical    [name] ZJQ:[id number] DOB: 1946/04/21 DOA: 07/09/2021  PCP: Myrlene Broker, MD (Confirm with patient/family/NH records and if not entered, this has to be entered at [facility] point of entry) Patient coming from: Home  I have personally briefly reviewed patient's old medical records in [facility]  Chief Complaint: I fell and could not get up.  HPI: [name] is a 75 y.o. female with medical history significant of AAS status post TAVR, chronic diastolic CHF LVEF 60 to 65%, HTN, IDDM with insulin resistance, chronic diabetic neuropathy, venous insufficiency and chronic bilateral leg lymphedema, came with episode of fall and worsening ambulation dysfunction.  Patient has had chronic ambulation dysfunction/leg weakness due to uncontrolled peripheral neuropathy and lymphedema, use walker to ambulate along with Unna boot.  She also has chronic diastolic CHF NYHA class II, on torsemide.  At baseline, she feels frequent episodes of lightheadedness/near syncope.  4 days ago, she was doing handwashing of her clothing in the bathtub, slipped and fell on her back into the bathtub.  She tried several times to get up but she feels so weak and passed out both slippery.  Unable to get up, she stayed in the past For last 4 days, drinking tap water to prevent dehydration.  This morning, hearing neighbor knocking on her door, she used the showerhead to tap the bathtub glass door which warn her neighbor to call EMS.  She denied any loss of consciousness, no headache blurry vision.  She has severe left elbow pain and back pain and hip pain after the fall event.  ED Course: Vital signs stable, no hypotension or tachycardia, no hypoxia.  Blood work showed, no AKI, CK levels within normal limits.  She was given 1 L of IV bolus.  Review of Systems: As per HPI otherwise 14 point review of systems negative.    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 Laneta Simmers and Excell Seltzer   Venous insufficiency    Ventral hernia     Past Surgical History:  Procedure Laterality Date   ABDOMINAL HYSTERECTOMY  2001   TAH-BSO   CHOLECYSTECTOMY  2001   COLONOSCOPY  2013   diverticulosis    ESOPHAGOGASTRODUODENOSCOPY  2013   normal    INCISIONAL HERNIA REPAIR     RIGHT/LEFT HEART CATH AND CORONARY ANGIOGRAPHY N/A 05/09/2020   Procedure: RIGHT/LEFT HEART CATH AND CORONARY ANGIOGRAPHY;  Surgeon: Corky Crafts, MD;  Location: [facility] INVASIVE CV LAB;  Service: Cardiovascular;  Laterality: N/A;   TEE WITHOUT CARDIOVERSION N/A 05/17/2020   Procedure: TRANSESOPHAGEAL ECHOCARDIOGRAM (TEE);  Surgeon: Tonny Bollman, MD;  Location: [facility] INVASIVE CV LAB;  Service: Open Heart Surgery;  Laterality: N/A;   TONSILLECTOMY     TRANSCATHETER AORTIC VALVE REPLACEMENT, TRANSFEMORAL N/A 05/17/2020   Procedure: TRANSCATHETER AORTIC VALVE REPLACEMENT, TRANSFEMORAL;  Surgeon:  Tonny Bollman, MD;  Location: [facility] INVASIVE CV LAB;  Service: Open Heart Surgery;  Laterality: N/A;     reports that she has never smoked. She has never used smokeless tobacco. She reports that she does not drink alcohol and does not use drugs.  Allergies  Allergen Reactions   Morphine Other (See Comments)    'took me out of this world' I had to be resuscitated   Codeine Sulfate Other (See Comments)    GI upset and pain   Demerol [Meperidine] Nausea And Vomiting   Meperidine Hcl  Nausea And Vomiting and Swelling   Propoxyphene Hcl Other (See Comments)    Darvocet caused sick headache    Family History  Problem Relation Age of Onset   Heart failure Father        Died in 59s   CVA Father    Ovarian cancer Sister    Diabetes Sister    Other Sister        died at birth   Heart failure Mother        Died in 83s   Other Brother        drowned   Diabetes Paternal Grandmother    Breast cancer Paternal Aunt    Colon cancer Neg Hx      Prior to Admission medications   Medication Sig Start Date End Date Taking? Authorizing Provider  acetaminophen (TYLENOL) 325 MG tablet Take 325 mg by mouth daily as needed for mild pain or headache.    [provider]  amoxicillin (AMOXIL) 500 MG tablet Take 4 tablets (2,000 mg total) by mouth as directed. 1 hour prior to dental work including cleanings 06/07/21   Janetta Hora, PA-C  aspirin 81 MG tablet Take 81 mg by mouth daily.    [provider]  atorvastatin (LIPITOR) 40 MG tablet Take 1 tablet (40 mg total) by mouth daily. 03/18/21 09/14/21  Almon Hercules, MD  citalopram (CELEXA) 20 MG tablet TAKE 1 TABLET BY MOUTH  DAILY 08/07/20   Myrlene Broker, MD  Continuous Blood Gluc Sensor (FREESTYLE LIBRE SENSOR SYSTEM) MISC Use to monitor sugars 03/20/20   Myrlene Broker, MD  esomeprazole (NEXIUM) 20 MG capsule Take 1 capsule (20 mg total) by mouth daily at 12 noon. 03/07/20   Myrlene Broker, MD  ezetimibe (ZETIA) 10 MG tablet Take 1 tablet (10 mg total) by mouth daily. 03/02/21   Myrlene Broker, MD  gabapentin (NEURONTIN) 300 MG capsule TAKE 1 CAPSULE BY MOUTH 3  TIMES DAILY 05/10/21   Myrlene Broker, MD  glucose blood [facility] VERIO) test strip USE AS DIRECTED 05/02/21   Myrlene Broker, MD  insulin NPH-regular Human (70-30) 100 UNIT/ML injection Inject 45 Units into the skin 2 (two) times daily with a meal.    [provider]  Insulin Pen Needle 31G X 8 MM MISC Use  to inject insulin four times daily E11.41 04/06/15   Myrlene Broker, MD  metFORMIN (GLUCOPHAGE) 1000 MG tablet TAKE ONE-HALF TABLET BY  MOUTH TWICE DAILY WITH A  MEAL 04/12/21   Myrlene Broker, MD  metoprolol tartrate (LOPRESSOR) 25 MG tablet Take 0.5 tablets (12.5 mg total) by mouth 2 (two) times daily. 02/22/21   Myrlene Broker, MD  nystatin (MYCOSTATIN/NYSTOP) powder Apply 1 application topically 3 (three) times daily. 05/04/21   Myrlene Broker, MD  Potassium Chloride ER 20 MEQ TBCR Take 10 mEq by mouth daily. 05/04/21 06/03/21  Hillard Danker  A, MD  Semaglutide (RYBELSUS) 14 MG TABS Take 14 mg by mouth daily. 06/12/21   Myrlene Broker, MD  sodium chloride (OCEAN) 0.65 % SOLN nasal spray Place 1 spray into both nostrils 4 (four) times daily. Patient taking differently: Place 1 spray into both nostrils 4 (four) times daily as needed for congestion. 03/07/20   Myrlene Broker, MD  tetrahydrozoline 0.05 % ophthalmic solution Place 1-2 drops into both eyes 2 (two) times daily as needed (Dry eyes).    [provider]  torsemide (DEMADEX) 20 MG tablet Take 1 tablet (20 mg total) by mouth 2 (two) times daily. 06/12/21   Myrlene Broker, MD  Trolamine Salicylate (ASPERCREME EX) Apply 1 application topically daily as needed (shoulder/hips/knee/knuckles).    [provider]    Physical Exam: Vitals:   07/09/21 1349 07/09/21 1353  BP:  124/67  Pulse:  (!) 103  Resp:  16  Temp:  97.9 F (36.6 C)  TempSrc:  Oral  SpO2:  98%  Weight: 85.3 kg   Height: 5\' 4"  (1.626 m)     Constitutional: NAD, calm, comfortable Vitals:   07/09/21 1349 07/09/21 1353  BP:  124/67  Pulse:  (!) 103  Resp:  16  Temp:  97.9 F (36.6 C)  TempSrc:  Oral  SpO2:  98%  Weight: 85.3 kg   Height: 5\' 4"  (1.626 m)    Eyes: PERRL, lids and conjunctivae normal ENMT: Mucous membranes are moist. Posterior pharynx clear of any exudate or lesions.Normal dentition.   Neck: normal, supple, no masses, no thyromegaly Respiratory: clear to auscultation bilaterally, no wheezing, no crackles. Normal respiratory effort. No accessory muscle use.  Cardiovascular: Regular rate and rhythm, no murmurs / rubs / gallops.  Chronic lymphedema with venous stasis discoloration. 2+ pedal pulses. No carotid bruits.  Abdomen: no tenderness, no masses palpated. No hepatosplenomegaly. Bowel sounds positive.  Musculoskeletal: bruise on left inner elbow, left hip tender on knocking. Skin: no rashes, lesions, ulcers. No induration Neurologic: CN 2-12 grossly intact. Sensation intact, DTR normal. Strength 5/5 in all 4.  Psychiatric: Normal judgment and insight. Alert and oriented x 3. Normal mood.     Labs on Admission: I have personally reviewed following labs and imaging studies  CBC: Recent Labs  Lab 07/09/21 1354  WBC 13.2*  NEUTROABS 10.3*  HGB 13.9  HCT 42.0  MCV 84.8  PLT 216   Basic Metabolic Panel: Recent Labs  Lab 07/09/21 1354  NA 135  K 3.7  CL 100  CO2 23  GLUCOSE 196*  BUN 20  CREATININE 0.81  CALCIUM 8.9   GFR: Estimated Creatinine Clearance: 64.4 mL/min (by C-G formula based on SCr of 0.81 mg/dL). Liver Function Tests: Recent Labs  Lab 07/09/21 1354  AST 22  ALT 21  ALKPHOS 128*  BILITOT 2.0*  PROT 6.3*  ALBUMIN 2.4*   No results for input(s): LIPASE, AMYLASE in the last 168 hours. No results for input(s): AMMONIA in the last 168 hours. Coagulation Profile: No results for input(s): INR, PROTIME in the last 168 hours. Cardiac Enzymes: Recent Labs  Lab 07/09/21 1354  CKTOTAL 133   BNP (last 3 results) Recent Labs    02/22/21 1536  PROBNP 55.0   HbA1C: No results for input(s): HGBA1C in the last 72 hours. CBG: No results for input(s): GLUCAP in the last 168 hours. Lipid Profile: No results for input(s): CHOL, HDL, LDLCALC, TRIG, CHOLHDL, LDLDIRECT in the last 72 hours. Thyroid Function Tests: No results  for input(s):  TSH, T4TOTAL, FREET4, T3FREE, THYROIDAB in the last 72 hours. Anemia Panel: No results for input(s): VITAMINB12, FOLATE, FERRITIN, TIBC, IRON, RETICCTPCT in the last 72 hours. Urine analysis:    Component Value Date/Time   COLORURINE YELLOW 05/17/2020 0410   APPEARANCEUR CLEAR 05/17/2020 0410   LABSPEC 1.011 05/17/2020 0410   PHURINE 6.0 05/17/2020 0410   GLUCOSEU 150 (A) 05/17/2020 0410   HGBUR SMALL (A) 05/17/2020 0410   BILIRUBINUR NEGATIVE 05/17/2020 0410   BILIRUBINUR Neg 05/10/2019 1029   KETONESUR NEGATIVE 05/17/2020 0410   PROTEINUR NEGATIVE 05/17/2020 0410   UROBILINOGEN 0.2 05/10/2019 1029   UROBILINOGEN 0.2 12/19/2014 1425   NITRITE NEGATIVE 05/17/2020 0410   LEUKOCYTESUR NEGATIVE 05/17/2020 0410    Radiological Exams on Admission: No results found.  EKG: Independently reviewed.  Sinus, no PR or QTC changes  Assessment/Plan Active Problems:   Near syncope   Fall  (please populate well all problems here in Problem List. (For example, if patient is on BP meds at home and you resume or decide to hold them, it is a problem that needs to be her. Same for CAD, COPD, HLD and so on)  Near syncope and fall -Suspect overdiuresis.  Hold diuresis now -Orthostatic vital signs, PT evaluation. -Echo was done recently will not repeat. -UA to rule out UTI.  Acute on chronic duration dysfunction -We will check x-ray of left elbow and pelvis without contrast (patient -Patient appears to be resistant to the idea of rehab/SNF, will consult case manager to set up home care.  Elevated LFTs, bilirubinemia and hypoalbuminemia -No history of cirrhosis, CT abdomen last year showed no signs of cirrhosis.  UA pending. -No RUQ pain, status post cholecystectomy, recheck CMP tomorrow.  Leukocytosis -No cough, no urinary symptoms, no diarrhea.  Monitor off antibiotics for now.  Repeat CBC in AM.  Chronic leg lymphedema -She was lying for last 4 days in a bathtub, concerning for DVT, DVT  study ordered. -Unna boots  Chronic diastolic CHF NYHA class II -Hold diuresis today, resume torsemide tomorrow.  IIDM -With insulin resistance.  Expect patient will eat less calories, cut down her 70/30 from 45 units twice daily to 30 unit twice daily.  Add sliding scale.    DVT prophylaxis: Lovenox Code Status: Full code Family Communication: None at bedside Disposition Plan: Expect less than 2 midnight hospital stay, PT evaluation, likely need SNF Consults called: None Admission status: MedSurg observation.   Emeline GeneralPing T Joretta Eads MD [facility] Pager [phone]  07/09/2021, 4:39 PM

## 2021-07-09 NOTE — ED Triage Notes (Signed)
Pt arrives via [facility] for fall in bathtub four days ago. Pt was unable to get out of tub during the duration. Pt A+Ox4 on arrival. Pt has pressure sores to sacrum, suprapubic area, and L arm.   #18 R AC

## 2021-07-09 NOTE — ED Notes (Signed)
Attempted report x 2 

## 2021-07-09 NOTE — ED Provider Notes (Signed)
[facility] EMERGENCY DEPARTMENT Provider Note   CSN: [id number] Arrival date & time: 07/09/21  1334     History CC - Weakness  [name] is a 75 y.o. female.  HPI     75 year old female with a past medical history of diabetes, hypertension, hyperlipidemia, TAVR presenting to the emergency department after being down for 4 days.  Patient reports that she was washing her underwear and bathtub when she "slipped" into the bathtub.  She states that she did not fall and hit her head, but slipped into the bathtub and was unable to get up due to generalized weakness.  She denies any hip or leg pain.  She states that she was also unable to call for help.  She laid in the bathtub for 4 days in water and urine.  Eventually, her neighbor came to check on her and heard her banging on the bathroom window with the showerhead.  Patient reports that she was able to take small sips of water from the bathtub faucet.  EMS report that when they got there, she was laying in a mix of water and urine.  Her clothes were soiled.  Patient states that she has known sacral ulcers.  She denies any fevers, chest pain, shortness of breath, nausea, vomiting, diarrhea.  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 Laneta Simmers and Excell Seltzer   Venous insufficiency    Ventral hernia    Patient Active Problem List   Diagnosis Date Noted   S/P TAVR (transcatheter aortic  valve replacement) 05/17/2020   Syncope 05/11/2020   Pressure injury of skin 05/11/2020   Orthostatic hypotension 05/11/2020   Dental caries 05/11/2020   Aortic stenosis    Diabetic neuropathy (HCC) 11/13/2018   Left knee pain 10/24/2017   Greater trochanteric bursitis of left hip 10/24/2017   Atypical chest pain 10/10/2015   Reactive airway disease 04/09/2014   Seasonal allergic rhinitis 04/09/2014   Preventative health care 04/09/2014   Abnormality of gait 03/17/2013   Irritable bowel syndrome (IBS) 08/27/2012   Shoulder pain 09/25/2009   Type II diabetes mellitus with neurological manifestations, 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   GERD 06/18/2007   Hyperlipidemia associated with type 2 diabetes mellitus (HCC) 12/27/2006   Essential hypertension 09/19/2006   Venous (peripheral) insufficiency 09/19/2006    Past Surgical History:  Procedure Laterality Date   ABDOMINAL HYSTERECTOMY  2001   TAH-BSO   CHOLECYSTECTOMY  2001   COLONOSCOPY  2013   diverticulosis    ESOPHAGOGASTRODUODENOSCOPY  2013   normal    INCISIONAL HERNIA REPAIR     RIGHT/LEFT HEART CATH AND CORONARY ANGIOGRAPHY N/A 05/09/2020   Procedure: RIGHT/LEFT HEART CATH AND CORONARY ANGIOGRAPHY;  Surgeon: Corky Crafts, MD;  Location: [facility] INVASIVE CV LAB;  Service: Cardiovascular;  Laterality: N/A;   TEE WITHOUT CARDIOVERSION N/A 05/17/2020  Procedure: TRANSESOPHAGEAL ECHOCARDIOGRAM (TEE);  Surgeon: Tonny Bollmanooper, Michael, MD;  [facility];  Service: Open Heart Surgery;  Laterality: N/A;   TONSILLECTOMY     TRANSCATHETER AORTIC VALVE REPLACEMENT, TRANSFEMORAL N/A 05/17/2020   Procedure: TRANSCATHETER AORTIC VALVE REPLACEMENT, TRANSFEMORAL;  Surgeon: Tonny Bollmanooper, Michael, MD;  [facility];  Service: Open Heart Surgery;  Laterality: N/A;     OB History   No obstetric history on file.     Family History  Problem Relation Age of Onset   Heart failure Father        Died in 7580s   CVA Father     Ovarian cancer Sister    Diabetes Sister    Other Sister        died at birth   Heart failure Mother        Died in 4680s   Other Brother        drowned   Diabetes Paternal Grandmother    Breast cancer Paternal Aunt    Colon cancer Neg Hx     Social History   Tobacco Use   Smoking status: Never   Smokeless tobacco: Never  Substance Use Topics   Alcohol use: No   Drug use: No    Home Medications Prior to Admission medications   Medication Sig Start Date End Date Taking? Authorizing Provider  acetaminophen (TYLENOL) 325 MG tablet Take 325 mg by mouth daily as needed for mild pain or headache.    [provider]  amoxicillin (AMOXIL) 500 MG tablet Take 4 tablets (2,000 mg total) by mouth as directed. 1 hour prior to dental work including cleanings 06/07/21   [name], Kathryn R, PA-C  aspirin 81 MG tablet Take 81 mg by mouth daily.    [provider]  atorvastatin (LIPITOR) 40 MG tablet Take 1 tablet (40 mg total) by mouth daily. 03/18/21 09/14/21  Almon HerculesGonfa, Taye T, MD  citalopram (CELEXA) 20 MG tablet TAKE 1 TABLET BY MOUTH  DAILY 08/07/20   Myrlene Brokerrawford, [name], MD  Continuous Blood Gluc Sensor (FREESTYLE LIBRE SENSOR SYSTEM) MISC Use to monitor sugars 03/20/20   Myrlene Brokerrawford, [name], MD  esomeprazole (NEXIUM) 20 MG capsule Take 1 capsule (20 mg total) by mouth daily at 12 noon. 03/07/20   Myrlene Brokerrawford, [name], MD  ezetimibe (ZETIA) 10 MG tablet Take 1 tablet (10 mg total) by mouth daily. 03/02/21   Myrlene Brokerrawford, [name], MD  gabapentin (NEURONTIN) 300 MG capsule TAKE 1 CAPSULE BY MOUTH 3  TIMES DAILY 05/10/21   Myrlene Brokerrawford, [name], MD  glucose blood [facility](ONETOUCH VERIO) test strip USE AS DIRECTED 05/02/21   Myrlene Brokerrawford, [name], MD  insulin NPH-regular Human (70-30) 100 UNIT/ML injection Inject 45 Units into the skin 2 (two) times daily with a meal.    [provider]  Insulin Pen Needle 31G X 8 MM MISC Use to inject insulin four times daily E11.41 04/06/15    Myrlene Brokerrawford, [name], MD  metFORMIN (GLUCOPHAGE) 1000 MG tablet TAKE ONE-HALF TABLET BY  MOUTH TWICE DAILY WITH A  MEAL 04/12/21   Myrlene Brokerrawford, [name], MD  metoprolol tartrate (LOPRESSOR) 25 MG tablet Take 0.5 tablets (12.5 mg total) by mouth 2 (two) times daily. 02/22/21   Myrlene Brokerrawford, [name], MD  nystatin (MYCOSTATIN/NYSTOP) powder Apply 1 application topically 3 (three) times daily. 05/04/21   Myrlene Brokerrawford, [name], MD  Potassium Chloride ER 20 MEQ TBCR Take 10 mEq by mouth daily. 05/04/21 06/03/21  Myrlene Brokerrawford, [name], MD  Semaglutide (RYBELSUS) 14 MG TABS Take 14 mg by mouth daily. 06/12/21   Myrlene Broker, MD  sodium chloride (OCEAN) 0.65 % SOLN nasal spray Place 1 spray into both nostrils 4 (four) times daily. Patient taking differently: Place 1 spray into both nostrils 4 (four) times daily as needed for congestion. 03/07/20   Myrlene Broker, MD  tetrahydrozoline 0.05 % ophthalmic solution Place 1-2 drops into both eyes 2 (two) times daily as needed (Dry eyes).    [provider]  torsemide (DEMADEX) 20 MG tablet Take 1 tablet (20 mg total) by mouth 2 (two) times daily. 06/12/21   Myrlene Broker, MD  Trolamine Salicylate (ASPERCREME EX) Apply 1 application topically daily as needed (shoulder/hips/knee/knuckles).    [provider]    Allergies    Morphine, Codeine sulfate, Demerol [meperidine], Meperidine hcl, and Propoxyphene hcl  Review of Systems   Review of Systems  Constitutional:  Negative for chills and fever.  HENT:  Negative for ear pain and sore throat.   Eyes:  Negative for pain and visual disturbance.  Respiratory:  Negative for cough and shortness of breath.   Cardiovascular:  Negative for chest pain and palpitations.  Gastrointestinal:  Negative for abdominal pain and vomiting.  Genitourinary:  Negative for dysuria and hematuria.  Musculoskeletal:  Negative for arthralgias and back pain.  Skin:  Positive for wound. Negative for color  change and rash.  Neurological:  Negative for seizures and syncope.  All other systems reviewed and are negative.  Physical Exam Updated Vital Signs BP 124/67   Pulse (!) 103   Temp 97.9 F (36.6 C) (Oral)   Resp 16   Ht 5\' 4"  (1.626 m)   Wt 85.3 kg   SpO2 98%   BMI 32.27 kg/m   Physical Exam Vitals and nursing note reviewed.  Constitutional:      General: She is not in acute distress.    Appearance: She is well-developed.  HENT:     Head: Normocephalic and atraumatic.  Eyes:     Conjunctiva/sclera: Conjunctivae normal.  Cardiovascular:     Rate and Rhythm: Normal rate and regular rhythm.     Heart sounds: No murmur heard. Pulmonary:     Effort: Pulmonary effort is normal. No respiratory distress.     Breath sounds: Normal breath sounds.  Abdominal:     Palpations: Abdomen is soft.     Tenderness: There is no abdominal tenderness.  Musculoskeletal:     Cervical back: Normal range of motion and neck supple. No rigidity or tenderness.     Comments: Chronic lymphedema bilaterally.  Patient is able to range the bilateral hips through flexion extension without pain.  She has stage II sacral pressure ulcers.  Erythema with satellite lesions over the intertriginous areas under the breasts and on the left upper arm.  Skin:    General: Skin is warm and dry.     Capillary Refill: Capillary refill takes 2 to 3 seconds.  Neurological:     Mental Status: She is alert and oriented to person, place, and time.   ED Results / Procedures / Treatments   Labs (all labs ordered are listed, but only abnormal results are displayed) Labs Reviewed  CBC WITH DIFFERENTIAL/PLATELET - Abnormal; Notable for the following components:      Result Value   WBC 13.2 (*)    Neutro Abs 10.3 (*)    Monocytes Absolute 1.3 (*)    Abs Immature Granulocytes 0.18 (*)  All other components within normal limits  COMPREHENSIVE METABOLIC PANEL - Abnormal; Notable for the following components:   Glucose,  Bld 196 (*)    Total Protein 6.3 (*)    Albumin 2.4 (*)    Alkaline Phosphatase 128 (*)    Total Bilirubin 2.0 (*)    All other components within normal limits  RESP PANEL BY RT-PCR (FLU A&B, COVID) ARPGX2  LACTIC ACID, PLASMA  CK  LACTIC ACID, PLASMA  URINALYSIS, ROUTINE W REFLEX MICROSCOPIC    EKG EKG Interpretation  Date/Time:  Monday July 09 2021 14:39:07 EDT Ventricular Rate:  95 PR Interval:  116 QRS Duration: 132 QT Interval:  356 QTC Calculation: 447 R Axis:   -35 Text Interpretation: Normal sinus rhythm Left axis deviation Right bundle branch block Minimal voltage criteria for LVH, may be normal variant ( R in aVL ) Abnormal ECG Confirmed by Pricilla Loveless [id number]) on 07/09/2021 2:45:17 PM  Radiology No results found.  Procedures Procedures   Medications Ordered in ED Medications  lactated ringers bolus 1,000 mL (1,000 mLs Intravenous New Bag/Given 07/09/21 1436)    ED Course  I have reviewed the triage vital signs and the nursing notes.  Pertinent labs & imaging results that were available during my care of the patient were reviewed by me and considered in my medical decision making (see chart for details).    MDM Rules/Calculators/A&P                           75 year old female with above past medical history presenting to the emergency department after being down for 4 days at home.  Patient reports that she did not fall and strike her head, but simply did not have the energy to get up out of the bathtub.  Vital signs reviewed, she is slightly tachycardic on arrival but otherwise within acceptable limits.  Physical exam is notable for stage II sacral ulcers and some erythematous streaks with satellite lesions consistent with candidal rash in the intertriginous areas under her breasts and on her left arm.  Differential includes infection, UTI, rhabdo, deconditioning.  Will obtain CBC, CMP, CK, urine.  Will administer fluids and feed the patient.  Mild  leukocytosis.  Metabolic panel shows mild hyperglycemia, but normal kidney function.  CK without any evidence of rhabdo.  Urine still pending.  Following fluid resuscitation, I attempted to ambulate the patient but she is globally weak and unable to even sit up for prolonged period of time under her own power.  She has significant deconditioning and is not safe to go home where she lives alone.  Hospitalist consulted for admission. Final Clinical Impression(s) / ED Diagnoses Final diagnoses:  Fall in home, initial encounter    Rx / DC Orders ED Discharge Orders     None        Lenard Lance, MD 07/09/21 2119    Pricilla Loveless, MD 07/10/21 [phone]

## 2021-07-10 DIAGNOSIS — W19XXXD Unspecified fall, subsequent encounter: Secondary | ICD-10-CM

## 2021-07-10 DIAGNOSIS — Y92009 Unspecified place in unspecified non-institutional (private) residence as the place of occurrence of the external cause: Secondary | ICD-10-CM | POA: Diagnosis not present

## 2021-07-10 DIAGNOSIS — W19XXXA Unspecified fall, initial encounter: Secondary | ICD-10-CM | POA: Diagnosis not present

## 2021-07-10 DIAGNOSIS — R55 Syncope and collapse: Secondary | ICD-10-CM | POA: Diagnosis not present

## 2021-07-10 LAB — CBC WITH DIFFERENTIAL/PLATELET
Abs Immature Granulocytes: 0.03 10*3/uL (ref 0.00–0.07)
Basophils Absolute: 0.1 10*3/uL (ref 0.0–0.1)
Basophils Relative: 1 %
Eosinophils Absolute: 0.4 10*3/uL (ref 0.0–0.5)
Eosinophils Relative: 4 %
HCT: 38.6 % (ref 36.0–46.0)
Hemoglobin: 13 g/dL (ref 12.0–15.0)
Immature Granulocytes: 0 %
Lymphocytes Relative: 16 %
Lymphs Abs: 1.6 10*3/uL (ref 0.7–4.0)
MCH: 28.1 pg (ref 26.0–34.0)
MCHC: 33.7 g/dL (ref 30.0–36.0)
MCV: 83.5 fL (ref 80.0–100.0)
Monocytes Absolute: 1.2 10*3/uL — ABNORMAL HIGH (ref 0.1–1.0)
Monocytes Relative: 11 %
Neutro Abs: 6.8 10*3/uL (ref 1.7–7.7)
Neutrophils Relative %: 68 %
Platelets: 217 10*3/uL (ref 150–400)
RBC: 4.62 MIL/uL (ref 3.87–5.11)
RDW: 13.8 % (ref 11.5–15.5)
WBC: 10.2 10*3/uL (ref 4.0–10.5)
nRBC: 0 % (ref 0.0–0.2)

## 2021-07-10 LAB — GLUCOSE, CAPILLARY
Glucose-Capillary: 148 mg/dL — ABNORMAL HIGH (ref 70–99)
Glucose-Capillary: 111 mg/dL — ABNORMAL HIGH (ref 70–99)
Glucose-Capillary: 131 mg/dL — ABNORMAL HIGH (ref 70–99)
Glucose-Capillary: 69 mg/dL — ABNORMAL LOW (ref 70–99)
Glucose-Capillary: 94 mg/dL (ref 70–99)

## 2021-07-10 LAB — COMPREHENSIVE METABOLIC PANEL
ALT: 19 U/L (ref 0–44)
AST: 24 U/L (ref 15–41)
Albumin: 2.2 g/dL — ABNORMAL LOW (ref 3.5–5.0)
Alkaline Phosphatase: 124 U/L (ref 38–126)
Anion gap: 6 (ref 5–15)
BUN: 22 mg/dL (ref 8–23)
CO2: 28 mmol/L (ref 22–32)
Calcium: 8.8 mg/dL — ABNORMAL LOW (ref 8.9–10.3)
Chloride: 102 mmol/L (ref 98–111)
Creatinine, Ser: 0.76 mg/dL (ref 0.44–1.00)
GFR, Estimated: >60 mL/min (ref >60)
Glucose, Bld: 181 mg/dL — ABNORMAL HIGH (ref 70–99)
Potassium: 3.8 mmol/L (ref 3.5–5.1)
Sodium: 136 mmol/L (ref 135–145)
Total Bilirubin: 0.7 mg/dL (ref 0.3–1.2)
Total Protein: 5.7 g/dL — ABNORMAL LOW (ref 6.5–8.1)

## 2021-07-10 MED ORDER — NYSTATIN 100000 UNIT/GM EX POWD
Freq: Two times a day (BID) | CUTANEOUS | Status: DC
  Filled 2021-07-10: qty 15

## 2021-07-10 MED ORDER — COLLAGENASE 250 UNIT/GM EX OINT
TOPICAL_OINTMENT | Freq: Every day | CUTANEOUS | Status: DC
  Filled 2021-07-10 (×2): qty 30

## 2021-07-10 NOTE — Progress Notes (Signed)
Occupational Therapy Evaluation Patient Details Name: Mililani Murthy MRN: [id number] DOB: 12-05-1946 Today's Date: 07/10/2021    History of Present Illness Johonna Binette is a 75 y.o. female who presented to ED after slipping into her bathtub while washing her clothes, she was unable to get out of the tub for 4 days due to generalized weakness. Pt with a past medical history of diabetes, hypertension, hyperlipidemia, TAVR, chronic diastolic CHF LVEF 60 to 65%, HTN, IDDM with insulin resistance, chronic diabetic neuropathy, venous insufficiency and chronic bilateral leg lymphedema.   Clinical Impression   Jaleisa was evaluated s/p the above fall into the tub. PTA she was mod I with use of a cane and rollator. She lives in a 1 level home with a ramped entrance. Upon evaluation pt was mod A for all bed mobility and max A for 2x sit<>stand unsuccessful attempts with reports of pain and dizziness in her head. Max A to laterally scoot. Pt requires minA for sitting EOB balance support while completing BUE functional task, and is currently max A +2 for all lower body ADLs. Pt benefits from OT acutely to progress fucntion in ADLs and mobility. Recommend d/c to SNF.     Follow Up Recommendations  SNF;Supervision/Assistance - 24 hour    Equipment Recommendations  3 in 1 bedside commode;Tub/shower seat       Precautions / Restrictions Precautions Precautions: Fall Precaution Comments: sacral wound, many areas of skin breakdwon Restrictions Weight Bearing Restrictions: No      Mobility Bed Mobility Overal bed mobility: Needs Assistance Bed Mobility: Supine to Sit;Sit to Supine     Supine to sit: Mod assist Sit to supine: Mod assist   General bed mobility comments: assist to evelate trunk, and to manage bilat LE back into bed    Transfers Overall transfer level: Needs assistance Equipment used: Rolling walker (2 wheeled) Transfers: Sit to/from Stand Sit to Stand: Max assist;From  elevated surface         General transfer comment: Pt unable to fully stand with max A +1 from elevated surface. Pt observed with an extremely flexed posture and reported feeling "dizzy" when attempting to stand.    Balance Overall balance assessment: Needs assistance;History of Falls Sitting-balance support: Feet supported;Single extremity supported Sitting balance-Leahy Scale: Poor Sitting balance - Comments: pt required intermittent minA for sitting balance     Standing balance-Leahy Scale: Zero                 ADL either performed or assessed with clinical judgement   ADL Overall ADL's : Needs assistance/impaired Eating/Feeding: Set up;Bed level;Sitting   Grooming: Set up;Sitting   Upper Body Bathing: Sitting Upper Body Bathing Details (indicate cue type and reason): minA for balance in sitting Lower Body Bathing: Maximal assistance;+2 for safety/equipment;Sit to/from stand   Upper Body Dressing : Minimal assistance;Sitting Upper Body Dressing Details (indicate cue type and reason): minA for balance Lower Body Dressing: Maximal assistance;+2 for physical assistance;+2 for safety/equipment;Sit to/from stand Lower Body Dressing Details (indicate cue type and reason): pt unable to don socks this session Toilet Transfer: Maximal assistance;+2 for safety/equipment;+2 for physical assistance;Stand-pivot;RW;BSC   Toileting- Clothing Manipulation and Hygiene: Maximal assistance;+2 for physical assistance;+2 for safety/equipment;Sit to/from stand       Functional mobility during ADLs: Maximal assistance;+2 for physical assistance;+2 for safety/equipment;Rolling walker General ADL Comments: Pt requried max A +2 for sit<>stand tasks, and min A for sitting balance. Pt fatigues quickly with any functional task  Vision Baseline Vision/History: No visual deficits Patient Visual Report: No change from baseline Vision Assessment?: No apparent visual deficits             Pertinent Vitals/Pain Pain Assessment: Faces Faces Pain Scale: Hurts even more Pain Location: Back of head, bilat hips Pain Descriptors / Indicators: Discomfort;Grimacing;Guarding;Heaviness Pain Intervention(s): Monitored during session;Limited activity within patient's tolerance     Hand Dominance Right   Extremity/Trunk Assessment Upper Extremity Assessment Upper Extremity Assessment: Generalized weakness   Lower Extremity Assessment Lower Extremity Assessment: Defer to PT evaluation   Cervical / Trunk Assessment Cervical / Trunk Assessment: Kyphotic (mild)   Communication Communication Communication: No difficulties   Cognition Arousal/Alertness: Awake/alert Behavior During Therapy: WFL for tasks assessed/performed Overall Cognitive Status: Within Functional Limits for tasks assessed         General Comments: Pt   General Comments  VSS on RA, pt with moisture related skin integrity issues, BLE swelling     Home Living Family/patient expects to be discharged to:: Private residence Living Arrangements: Alone Available Help at Discharge: Family;Friend(s);Available PRN/intermittently (very limited assistance) Type of Home: House Home Access: Ramped entrance     Home Layout: One level     Bathroom Shower/Tub: Tub/shower unit;Walk-in shower (Soaks clothes in tub, bathes in walk in shower)   Bathroom Toilet: Standard     Home Equipment: Cane - single point;Walker - 4 wheels   Additional Comments: Pt is frustrated wtih her neighbors because they did not hear her yelling for help. She is hesitant to ask them for assistance once d/c home.      Prior Functioning/Environment Level of Independence: Independent with assistive device(s)        Comments: Uses a cane for in-home ambulation, rollator for community mobility. Order groceries to her home or asks a friend/family for assistance        OT Problem List: Decreased strength;Decreased range of  motion;Decreased activity tolerance;Impaired balance (sitting and/or standing);Decreased safety awareness;Decreased knowledge of use of DME or AE;Decreased knowledge of precautions;Pain      OT Treatment/Interventions: Self-care/ADL training;Therapeutic exercise;Balance training;Patient/family education;Therapeutic activities;DME and/or AE instruction    OT Goals(Current goals can be found in the care plan section) Acute Rehab OT Goals Patient Stated Goal: to get to the chair OT Goal Formulation: With patient Time For Goal Achievement: 07/24/21 Potential to Achieve Goals: Fair ADL Goals Pt Will Perform Grooming: with supervision;standing Pt Will Perform Upper Body Bathing: with supervision;sitting Pt Will Perform Lower Body Bathing: with min guard assist;sit to/from stand Pt Will Perform Upper Body Dressing: with set-up;sitting Pt Will Perform Lower Body Dressing: with min guard assist;sit to/from stand Pt Will Transfer to Toilet: with min guard assist;stand pivot transfer;bedside commode Pt Will Perform Toileting - Clothing Manipulation and hygiene: with min guard assist;sit to/from stand Pt/caregiver will Perform Home Exercise Program: Increased strength;Both right and left upper extremity;With Supervision;With written HEP provided  OT Frequency: Min 2X/week   Barriers to D/C: Decreased caregiver support  pt lives alone with cery limited support          AM-PAC OT "6 Clicks" Daily Activity     Outcome Measure Help from another person eating meals?: None Help from another person taking care of personal grooming?: A Little Help from another person toileting, which includes using toliet, bedpan, or urinal?: A Lot Help from another person bathing (including washing, rinsing, drying)?: A Lot Help from another person to put on and taking off regular upper body clothing?: A Little Help from another  person to put on and taking off regular lower body clothing?: A Lot 6 Click Score: 16    End of Session Equipment Utilized During Treatment: Gait belt;Rolling walker Nurse Communication: Mobility status;Precautions;Weight bearing status  Activity Tolerance: Patient tolerated treatment well;Patient limited by fatigue Patient left: in bed;with call bell/phone within reach;with bed alarm set  OT Visit Diagnosis: Other abnormalities of gait and mobility (R26.89);Muscle weakness (generalized) (M62.81);History of falling (Z91.81);Pain                Time: 3734-2876 OT Time Calculation (min): 29 min Charges:  OT General Charges $OT Visit: 1 Visit OT Evaluation $OT Eval Moderate Complexity: 1 Mod OT Treatments $Therapeutic Activity: 8-22 mins    Harriet Bollen A Gertie Broerman 07/10/2021, 9:32 AM

## 2021-07-10 NOTE — Evaluation (Signed)
Physical Therapy Evaluation Patient Details Name: [name] MRN: [id number] DOB: 1946/12/07 Today's Date: 07/10/2021   History of Present Illness  [name] is a 75 y.o. female who presented to ED on 07/09/21 after slipping into her bathtub while washing her clothes, she was unable to get out of the tub for 4 days due to generalized weakness. X-ray (-) for acute fx/dislocation of elbow/B hip/pelvis. Pressure wounds on coccyx/sacrum and L elbow. Pt with a past medical history of diabetes, hypertension, hyperlipidemia, TAVR, chronic diastolic CHF LVEF 60 to 65%, HTN, IDDM with insulin resistance, chronic diabetic neuropathy, venous insufficiency and chronic bilateral leg lymphedema.  Clinical Impression  Pt. Is mod IND with use of SPC at baseline.  S/p fall pt. Is now demonstrating dec balance, difficulty with transfers and bed mobility and generalized weakness and would benefit from skilled PT to address deficits indicated in initial eval.  Pt. Lacks support at home and would benefit from SNF placement for safety prior to return home.  PT to trial progressing pt. To transfer with use of RW next visit if able.    Follow Up Recommendations SNF    Equipment Recommendations  Other (comment) (Pt. owns [facility] and rollator)    Recommendations for Other Services       Precautions / Restrictions Precautions Precautions: None Precaution Comments: sacral wound, many areas of skin breakdwon Restrictions Weight Bearing Restrictions: No      Mobility  Bed Mobility Overal bed mobility: Needs Assistance Bed Mobility: Rolling;Sidelying to Sit Rolling: Min assist Sidelying to sit: Min assist Supine to sit: Mod assist Sit to supine: Mod assist   General bed mobility comments: PT encourages pt. to roll to side prior to sitting up to prevent shear on sacral/coccyx wounds with scooting to EOB. LOB x 1 once sitting EOB requiring external support to correct. Patient Response:  Cooperative  Transfers Overall transfer level: Needs assistance Equipment used: Rolling walker (2 wheeled) Transfers: Sit to/from Stand Sit to Stand: Min assist         General transfer comment: Mild c/o discomfort with sitting on sara stedy, otherwise, tolerated transfer well.  Ambulation/Gait                Stairs            Wheelchair Mobility    Modified Rankin (Stroke Patients Only)       Balance Overall balance assessment: Needs assistance Sitting-balance support: Bilateral upper extremity supported Sitting balance-Leahy Scale: Poor Sitting balance - Comments: Posterior LOB x 1   Standing balance support: Bilateral upper extremity supported;During functional activity Standing balance-Leahy Scale: Poor                               Pertinent Vitals/Pain Pain Assessment: 0-10 Pain Score: 10-Worst pain ever Faces Pain Scale: Hurts even more Pain Location: Reports 10/10 headache at this time, also c/o pain in sacral wounds. Pain Descriptors / Indicators: Sore;Aching Pain Intervention(s): Repositioned;RN gave pain meds during session;Limited activity within patient's tolerance    Home Living Family/patient expects to be discharged to:: Skilled nursing facility Living Arrangements: Alone Available Help at Discharge: Family;Friend(s);Available PRN/intermittently (very limited assistance) Type of Home: House Home Access: Level entry     Home Layout: One level Home Equipment: Walker - 4 wheels;Cane - single point Additional Comments: Pt. reports she has multiple canes strategically placed around her house.  Tends to wall walk or use canes to  mobilize around house.  States house is too small for her rollator, but that she does use it to amb to mailbox.    Prior Function Level of Independence: Independent with assistive device(s)         Comments: Uses a cane for in-home ambulation, rollator for community mobility. Order groceries to her  home or asks a friend/family for assistance     Hand Dominance   Dominant Hand: Right    Extremity/Trunk Assessment   Upper Extremity Assessment Upper Extremity Assessment: Defer to OT evaluation    Lower Extremity Assessment Lower Extremity Assessment: Generalized weakness    Cervical / Trunk Assessment Cervical / Trunk Assessment: Kyphotic (mild)  Communication   Communication: No difficulties  Cognition Arousal/Alertness: Awake/alert Behavior During Therapy: WFL for tasks assessed/performed Overall Cognitive Status: Within Functional Limits for tasks assessed                                 General Comments: A + O x 3      General Comments General comments (skin integrity, edema, etc.): Per OT and NSG, pt. demonstrated difficulty with attempt to sit > stand prior to PT arrival with positive orthostatics and c/o dizziness/lightheadedness.  [place] Dec stedy used today for safety.    Exercises     Assessment/Plan    PT Assessment Patient needs continued PT services  PT Problem List Decreased strength;Decreased mobility;Decreased activity tolerance;Decreased skin integrity;Decreased balance;Pain       PT Treatment Interventions DME instruction;Therapeutic exercise;Gait training;Balance training;Functional mobility training;Therapeutic activities;Patient/family education    PT Goals (Current goals can be found in the Care Plan section)  Acute Rehab PT Goals Patient Stated Goal: Pt's goal is to return to IND. PT Goal Formulation: With patient Time For Goal Achievement: 07/24/21 Potential to Achieve Goals: Good    Frequency Min 2X/week   Barriers to discharge Other (comment) (Lives alone) Pt. lacks support at home, hx of falling    Co-evaluation               AM-PAC PT "6 Clicks" Mobility  Outcome Measure Help needed turning from your back to your side while in a flat bed without using bedrails?: A Little Help needed moving from lying on your  back to sitting on the side of a flat bed without using bedrails?: A Little Help needed moving to and from a bed to a chair (including a wheelchair)?: A Little Help needed standing up from a chair using your arms (e.g., wheelchair or bedside chair)?: A Little Help needed to walk in hospital room?: A Lot Help needed climbing 3-5 steps with a railing? : A Lot 6 Click Score: 16    End of Session Equipment Utilized During Treatment: Gait belt Activity Tolerance: Patient tolerated treatment well;Patient limited by pain Patient left: in chair;with chair alarm set;with call bell/phone within reach Nurse Communication: Mobility status;Need for lift equipment PT Visit Diagnosis: Unsteadiness on feet (R26.81);Muscle weakness (generalized) (M62.81);History of falling (Z91.81);Pain Pain - part of body:  (L elbow, sacrum/coccyx)    Time: 5035-4656 PT Time Calculation (min) (ACUTE ONLY): 38 min   Charges:     PT Treatments $Therapeutic Activity: 23-37 mins       Haadi Santellan A. Rendell Thivierge, PT, DPT Acute Rehabilitation Services Office: [phone]   Ameila Weldon A Jaquan Sadowsky 07/10/2021, 10:28 AM

## 2021-07-10 NOTE — NC FL2 (Signed)
[place] MEDICAID FL2 LEVEL OF CARE SCREENING TOOL     IDENTIFICATION  Patient Name: [name] Birthdate: 06-Jul-1946 Sex: female Admission Date (Current Location): 07/09/2021  [facility] [place] and [place] Number:  Producer, television/film/video and Address:  [facility], [address], [place], [place] [address]      Provider Number: [id number]  Attending Physician Name and Address:  Zannie Cove, MD  Relative Name and Phone Number:       Current Level of Care: Hospital Recommended Level of Care: Skilled Nursing Facility Prior Approval Number:    Date Approved/Denied:   PASRR Number: [id number]  Discharge Plan: SNF    Current Diagnoses: Patient Active Problem List   Diagnosis Date Noted   Fall at home 07/09/2021   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   Near syncope 05/11/2020   Pressure injury of skin 05/11/2020   Orthostatic hypotension 05/11/2020   Dental caries 05/11/2020   Aortic stenosis    Diabetic neuropathy (HCC) 11/13/2018   Left knee pain 10/24/2017   Greater trochanteric bursitis of left hip 10/24/2017   Atypical chest pain 10/10/2015   Reactive airway disease 04/09/2014   Seasonal allergic rhinitis 04/09/2014   Preventative health care 04/09/2014   Abnormality of gait 03/17/2013   Irritable bowel syndrome (IBS) 08/27/2012   Shoulder pain 09/25/2009   Type II diabetes mellitus with neurological manifestations, 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   GERD 06/18/2007   Hyperlipidemia associated with type 2 diabetes mellitus (HCC) 12/27/2006   Essential hypertension 09/19/2006   Venous (peripheral) insufficiency 09/19/2006    Orientation RESPIRATION BLADDER Height & Weight     Self, Time, Situation, Place  Normal Continent Weight: 85.3 kg Height:  5\' 4"  (162.6 cm)  BEHAVIORAL SYMPTOMS/MOOD NEUROLOGICAL BOWEL NUTRITION STATUS      Continent Diet (refer to d/c summary)  AMBULATORY STATUS COMMUNICATION OF NEEDS  Skin   Extensive Assist Verbally PU Stage and Appropriate Care (Unstagable wounds on sacrum)                       Personal Care Assistance Level of Assistance  Bathing, Feeding, Dressing Bathing Assistance: Limited assistance Feeding assistance: Independent Dressing Assistance: Limited assistance     Functional Limitations Info  Sight, Speech, Hearing Sight Info: Adequate Hearing Info: Adequate Speech Info: Adequate    SPECIAL CARE FACTORS FREQUENCY  PT (By licensed PT), OT (By licensed OT)     PT Frequency: 5x/week, evaluate and treat OT Frequency: 5x/week, evaluate and treat            Contractures Contractures Info: Not present    Additional Factors Info  Allergies, Code Status Code Status Info: Full code Allergies Info: Morphine, Codeine Sulfate, Demerol (Meperidine), Propoxyphene Hcl           Current Medications (07/10/2021):  This is the current hospital active medication list Current Facility-Administered Medications  Medication Dose Route Frequency Provider Last Rate Last Admin   acetaminophen (TYLENOL) tablet 650 mg  650 mg Oral Q6H PRN 09/09/2021 T, MD       Or   acetaminophen (TYLENOL) suppository 650 mg  650 mg Rectal Q6H PRN Mikey College, MD       aspirin EC tablet 81 mg  81 mg Oral Daily Emeline General T, MD   81 mg at 07/10/21 0944   atorvastatin (LIPITOR) tablet 40 mg  40 mg Oral Daily 09/09/21 T, MD   40 mg at 07/10/21  0944   citalopram (CELEXA) tablet 20 mg  20 mg Oral Daily Mikey College T, MD   20 mg at 07/10/21 0944   collagenase (SANTYL) ointment   Topical Daily Zannie Cove, MD   Given at 07/10/21 1215   enoxaparin (LOVENOX) injection 40 mg  40 mg Subcutaneous q1600 Mikey College T, MD   40 mg at 07/09/21 1752   ezetimibe (ZETIA) tablet 10 mg  10 mg Oral Daily Mikey College T, MD   10 mg at 07/10/21 0944   gabapentin (NEURONTIN) capsule 300 mg  300 mg Oral TID Mikey College T, MD   300 mg at 07/10/21 0944   insulin aspart (novoLOG)  injection 0-15 Units  0-15 Units Subcutaneous TID WC Mikey College T, MD   2 Units at 07/10/21 0947   insulin aspart protamine- aspart (NOVOLOG MIX 70/30) injection 30 Units  30 Units Subcutaneous BID WC Mikey College T, MD   30 Units at 07/10/21 0949   metFORMIN (GLUCOPHAGE) tablet 500 mg  500 mg Oral BID WC Mikey College T, MD   500 mg at 07/10/21 0944   metoprolol tartrate (LOPRESSOR) tablet 12.5 mg  12.5 mg Oral BID Mikey College T, MD   12.5 mg at 07/10/21 7681   Muscle Rub CREA 1 application  1 application Topical Daily PRN Mikey College T, MD       nystatin (MYCOSTATIN/NYSTOP) topical powder   Topical BID Zannie Cove, MD       ondansetron [facility]) tablet 4 mg  4 mg Oral Q6H PRN Mikey College T, MD       Or   ondansetron [facility]) injection 4 mg  4 mg Intravenous Q6H PRN Mikey College T, MD       pantoprazole (PROTONIX) EC tablet 40 mg  40 mg Oral Daily Mikey College T, MD   40 mg at 07/10/21 0944   potassium chloride SA (KLOR-CON) CR tablet 20 mEq  20 mEq Oral Daily Mikey College T, MD   20 mEq at 07/10/21 0944   Semaglutide TABS 14 mg  14 mg Oral Daily Mikey College T, MD       sodium chloride (OCEAN) 0.65 % nasal spray 1 spray  1 spray Each Nare QID PRN Emeline General, MD       torsemide [facility]) tablet 20 mg  20 mg Oral BID Mikey College T, MD   20 mg at 07/10/21 1572     Discharge Medications: Please see discharge summary for a list of discharge medications.  Relevant Imaging Results:  Relevant Lab Results:   Additional Information SSN [id number]  Epifanio Lesches, RN

## 2021-07-10 NOTE — TOC Initial Note (Addendum)
Transition of Care [facility]) - Initial/Assessment Note    Patient Details  Name: [name] MRN: [id number] Date of Birth: [birth year]  Transition of Care [facility]) CM/SW Contact:    Epifanio Lesches, RN Phone Number: 07/10/2021, 12:59 PM  Clinical Narrative:     Pt presented after slipping into her bathtub, was unable to get out  the tub x 4 days 2/2 weakness.  From home alone.          RNCM received consult for possible SNF placement at time of discharge. RNCM spoke with patient regarding PT recommendation of SNF placement at time of discharge. Patient reported she  is currently unable to care for self at home given current physical needs and fall risk. Patient expressed understanding of PT recommendation and is agreeable to SNF placement at time of discharge. Patient without preference for SNF. RNCM discussed insurance authorization process and provided Medicare SNF ratings list. Patient expressed being hopeful for rehab and to feel better soon. No further questions reported at this time. RNCM to continue to follow and assist with discharge planning needs.   COVID vaccinated x 2 , no booster noted.  Referral for SNF placement faxed out via H[facility].  Insurance authorization requested with TOC TEAM....authorization pending.  Expected Discharge Plan: Skilled Nursing Facility Barriers to Discharge: Continued Medical Work up   Patient Goals and CMS Choice   CMS Medicare.gov Compare Post Acute Care list provided to:: Patient    Expected Discharge Plan and Services Expected Discharge Plan: Skilled Nursing Facility   Discharge Planning Services: CM Consult   Living arrangements for the past 2 months: Single Family Home                                      Prior Living Arrangements/Services Living arrangements for the past 2 months: Single Family Home Lives with:: Self Patient language and need for interpreter reviewed:: Yes Do you feel safe going back to the place where  you live?: Yes      Need for Family Participation in Patient Care: Yes (Comment) Care giver support system in place?: No (comment) Current home services: DME (cane, rolling walker and rolator) Criminal Activity/Legal Involvement Pertinent to Current Situation/Hospitalization: No - Comment as needed  Activities of Daily Living      Permission Sought/Granted                  Emotional Assessment Appearance:: Appears stated age Attitude/Demeanor/Rapport: Gracious Affect (typically observed): Accepting Orientation: : Oriented to Self, Oriented to Place, Oriented to  Time, Oriented to Situation Alcohol / Substance Use: Not Applicable Psych Involvement: No (comment)  Admission diagnosis:  Pain [R52] Fall [W19.XXXA] Fall in home, initial encounter [W19.Lorne Skeens, Y92.009] Patient Active Problem List   Diagnosis Date Noted   Fall at home 07/09/2021   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   Near syncope 05/11/2020   Pressure injury of skin 05/11/2020   Orthostatic hypotension 05/11/2020   Dental caries 05/11/2020   Aortic stenosis    Diabetic neuropathy (HCC) 11/13/2018   Left knee pain 10/24/2017   Greater trochanteric bursitis of left hip 10/24/2017   Atypical chest pain 10/10/2015   Reactive airway disease 04/09/2014   Seasonal allergic rhinitis 04/09/2014   Preventative health care 04/09/2014   Abnormality of gait 03/17/2013   Irritable bowel syndrome (IBS) 08/27/2012   Shoulder pain 09/25/2009   Type II diabetes mellitus  with neurological manifestations, 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   GERD 06/18/2007   Hyperlipidemia associated with type 2 diabetes mellitus (HCC) 12/27/2006   Essential hypertension 09/19/2006   Venous (peripheral) insufficiency 09/19/2006   PCP:  Myrlene Broker, MD Pharmacy:   [facility] [address] [place], [address] [place] [address] Phone: [phone] Fax:  [phone]  [facility] - [place], [place] - [address] [place] [address] Phone: [phone] Fax: [phone]  [facility] ([facility]) - [place], [place] - [address] [place] [address] Phone: [phone] Fax: [phone]     Social Determinants of Health (SDOH) Interventions    Readmission Risk Interventions No flowsheet data found.

## 2021-07-10 NOTE — Progress Notes (Addendum)
PROGRESS NOTE    Marykay LexRuth Frances Lehane  ZOX:[id number] DOB: [birth year] DOA: 07/09/2021 PCP: Myrlene Brokerrawford, [name], MD  Brief Narrative: Kari BaarsRuth Patterson is a 75 year old female with history of AS s/p TAVR in 2021, chronic diastolic CHF, insulin-dependent diabetes mellitus, chronic diabetic neuropathy, chronic venous insufficiency, chronic bilateral lower extremity lymphedema presented to the ED after a fall, difficulty ambulating. -Patient reportedly fell in her bathtub 4 days prior to admission, slipped fell on her back tried multiple times to get up but was unable to do so, she stayed in the bathtub for 3 to 4 days soiled in her urine and feces, drank tap water.  She denies any loss of consciousness, no focal weakness, complains of aches and pains all over, multiple bruises over her body.  In the ED labs and vital signs were unremarkable   Assessment & Plan:  Falls, ambulatory dysfunction Peripheral neuropathy, lymphedema Chronic dizziness -Clinically appears euvolemic, check orthostatics -Hold diuretics today -PT OT eval, SNF recommended, was stuck in her bathtub for 3 to 4 days without the ability to stand up, crawl out -Exam consistent with peripheral neuropathy, has deconditioning and multiple wounds from moisture associated skin damage -Agree with recommendations for short-term rehab -TOC consult for placement  Multiple pressure wounds Unstageable sacral decubitus wound Left arm maculopapular rash Partial-thickness skin loss, left arm -Pressure wounds and MASD -Continue wound care   Hypoalbuminemia -Likely from malnutrition, no history of liver disease, hyperbilirubinemia has resolved   Leukocytosis -Likely reactive, resolved   Chronic leg lymphedema -She was lying for last 4 days in a bathtub,. -Unna boots -Resume diuretics tomorrow   Chronic diastolic CHF NYHA class II -Clinically euvolemic, diuretics on hold today   Insulin-dependent diabetes mellitus -With insulin  resistance.  -CBG stable, continue lower dose of insulin 70/30    DVT prophylaxis: Lovenox Code Status: Full code Family Communication: None at bedside Disposition Plan:  Status is: Observation  The patient will require care spanning > 2 midnights and should be moved to inpatient because: Unsafe d/c plan  Dispo: The patient is from: Home              Anticipated d/c is to: SNF              Patient currently is not medically stable to d/c.   Difficult to place patient No  Procedures:   Antimicrobials:    Subjective: -Feels better today, sore all over, multiple wounds on her bottom, arms  Objective: Vitals:   07/09/21 1754 07/09/21 1952 07/09/21 2000 07/10/21 0729  BP: 136/74 (!) 121/59 (!) 111/54 (!) 103/52  Pulse: 99 (!) 103 100 92  Resp: 14 16  19   Temp: 98.2 F (36.8 C) 98.4 F (36.9 C)  98.4 F (36.9 C)  TempSrc: Oral   Oral  SpO2: 99% 96% 97% 93%  Weight:      Height:        Intake/Output Summary (Last 24 hours) at 07/10/2021 1213 Last data filed at 07/09/2021 1649 Gross per 24 hour  Intake 1000 ml  Output --  Net 1000 ml   Filed Weights   07/09/21 1349  Weight: 85.3 kg    Examination:  General exam: Appears calm and comfortable  Respiratory system: Clear to auscultation Cardiovascular system: S1 & S2 heard, RRR.  Abd: nondistended, soft and nontender.Normal bowel sounds heard. Central nervous system: Alert and oriented. No focal neurological deficits. Extremities: Sno edema Skin: No rashes Psychiatry: Judgement and insight appear normal. Mood & affect appropriate.  Data Reviewed:   CBC: Recent Labs  Lab 07/09/21 1354 07/10/21 0212  WBC 13.2* 10.2  NEUTROABS 10.3* 6.8  HGB 13.9 13.0  HCT 42.0 38.6  MCV 84.8 83.5  PLT 216 217   Basic Metabolic Panel: Recent Labs  Lab 07/09/21 1354 07/10/21 0212  NA 135 136  K 3.7 3.8  CL 100 102  CO2 23 28  GLUCOSE 196* 181*  BUN 20 22  CREATININE 0.81 0.76  CALCIUM 8.9 8.8*    GFR: Estimated Creatinine Clearance: 65.2 mL/min (by C-G formula based on SCr of 0.76 mg/dL). Liver Function Tests: Recent Labs  Lab 07/09/21 1354 07/10/21 0212  AST 22 24  ALT 21 19  ALKPHOS 128* 124  BILITOT 2.0* 0.7  PROT 6.3* 5.7*  ALBUMIN 2.4* 2.2*   No results for input(s): LIPASE, AMYLASE in the last 168 hours. No results for input(s): AMMONIA in the last 168 hours. Coagulation Profile: No results for input(s): INR, PROTIME in the last 168 hours. Cardiac Enzymes: Recent Labs  Lab 07/09/21 1354  CKTOTAL 133   BNP (last 3 results) Recent Labs    02/22/21 1536  PROBNP 55.0   HbA1C: No results for input(s): HGBA1C in the last 72 hours. CBG: Recent Labs  Lab 07/09/21 1928 07/09/21 2208 07/10/21 0809 07/10/21 1145  GLUCAP 274* 277* 148* 111*   Lipid Profile: No results for input(s): CHOL, HDL, LDLCALC, TRIG, CHOLHDL, LDLDIRECT in the last 72 hours. Thyroid Function Tests: Recent Labs    07/09/21 2102  TSH 4.436   Anemia Panel: Recent Labs    07/09/21 2102  VITAMINB12 476   Urine analysis:    Component Value Date/Time   COLORURINE YELLOW 05/17/2020 0410   APPEARANCEUR CLEAR 05/17/2020 0410   LABSPEC 1.011 05/17/2020 0410   PHURINE 6.0 05/17/2020 0410   GLUCOSEU 150 (A) 05/17/2020 0410   HGBUR SMALL (A) 05/17/2020 0410   BILIRUBINUR NEGATIVE 05/17/2020 0410   BILIRUBINUR Neg 05/10/2019 1029   KETONESUR NEGATIVE 05/17/2020 0410   PROTEINUR NEGATIVE 05/17/2020 0410   UROBILINOGEN 0.2 05/10/2019 1029   UROBILINOGEN 0.2 12/19/2014 1425   NITRITE NEGATIVE 05/17/2020 0410   LEUKOCYTESUR NEGATIVE 05/17/2020 0410   Sepsis Labs: @LABRCNTIP (procalcitonin:4,lacticidven:4)  ) Recent Results (from the past 240 hour(s))  Resp Panel by RT-PCR (Flu A&B, Covid) Nasopharyngeal Swab     Status: None   Collection Time: 07/09/21  3:40 PM   Specimen: Nasopharyngeal Swab; Nasopharyngeal(NP) swabs in vial transport medium  Result Value Ref Range Status    SARS Coronavirus 2 by RT PCR NEGATIVE NEGATIVE Final    Comment: (NOTE) SARS-CoV-2 target nucleic acids are NOT DETECTED.  The SARS-CoV-2 RNA is generally detectable in upper respiratory specimens during the acute phase of infection. The lowest concentration of SARS-CoV-2 viral copies this assay can detect is 138 copies/mL. A negative result does not preclude SARS-Cov-2 infection and should not be used as the sole basis for treatment or other patient management decisions. A negative result may occur with  improper specimen collection/handling, submission of specimen other than nasopharyngeal swab, presence of viral mutation(s) within the areas targeted by this assay, and inadequate number of viral copies(<138 copies/mL). A negative result must be combined with clinical observations, patient history, and epidemiological information. The expected result is Negative.  Fact Sheet for Patients:  09/08/21  Fact Sheet for Healthcare Providers:  BloggerCourse.com  This test is no t yet approved or cleared by the [place] FDA and  has been authorized for detection and/or diagnosis of  SARS-CoV-2 by FDA under an Emergency Use Authorization (EUA). This EUA will remain  in effect (meaning this test can be used) for the duration of the COVID-19 declaration under Section 564(b)(1) of the Act, 21 U.S.C.section 360bbb-3(b)(1), unless the authorization is terminated  or revoked sooner.       Influenza A by PCR NEGATIVE NEGATIVE Final   Influenza B by PCR NEGATIVE NEGATIVE Final    Comment: (NOTE) The Xpert Xpress SARS-CoV-2/FLU/RSV plus assay is intended as an aid in the diagnosis of influenza from Nasopharyngeal swab specimens and should not be used as a sole basis for treatment. Nasal washings and aspirates are unacceptable for Xpert Xpress SARS-CoV-2/FLU/RSV testing.  Fact Sheet for  Patients: BloggerCourse.com  Fact Sheet for Healthcare Providers: SeriousBroker.it  This test is not yet approved or cleared by the [place] FDA and has been authorized for detection and/or diagnosis of SARS-CoV-2 by FDA under an Emergency Use Authorization (EUA). This EUA will remain in effect (meaning this test can be used) for the duration of the COVID-19 declaration under Section 564(b)(1) of the Act, 21 U.S.C. section 360bbb-3(b)(1), unless the authorization is terminated or revoked.  Performed at [facility], [address] [place], [place] [address]          Radiology Studies: DG Elbow 2 Views Left  Result Date: 07/09/2021 CLINICAL DATA:  Fall in bathtub 4 days ago.  Pressure sores. EXAM: LEFT ELBOW - 2 VIEW COMPARISON:  None. FINDINGS: The mineralization and alignment are normal. There is no evidence of acute fracture or dislocation. No evidence of elbow joint effusion. Mild spurring of the coronoid process. IMPRESSION: No acute osseous findings or elbow joint effusion. Electronically Signed   By: Carey Bullocks M.D.   On: 07/09/2021 17:51   DG HIP UNILAT WITH PELVIS 2-3 VIEWS LEFT  Result Date: 07/09/2021 CLINICAL DATA:  Fall in bathtub 4 days ago.  Pressure sores. EXAM: DG HIP (WITH OR WITHOUT PELVIS) 2-3V LEFT COMPARISON:  Radiograph 05/10/2020. FINDINGS: AP and frog-leg lateral views of the left hip. The mineralization and alignment are normal. There is no evidence of acute fracture or dislocation. No evidence of femoral head avascular necrosis. The hip joint spaces are preserved. Mild degenerative changes at the left sacroiliac joint. Femoral atherosclerosis noted. IMPRESSION: Stable left hip radiographs.  No acute osseous findings. Electronically Signed   By: Carey Bullocks M.D.   On: 07/09/2021 17:53   DG HIP UNILAT WITH PELVIS 2-3 VIEWS RIGHT  Result Date: 07/09/2021 CLINICAL DATA:  Fall in bathtub 4 days  ago.  Pressure sores. EXAM: DG HIP (WITH OR WITHOUT PELVIS) 2-3V RIGHT COMPARISON:  Radiographs 05/10/2020. FINDINGS: AP pelvis with AP and frog-leg lateral views of the right hip. The mineralization and alignment are normal. There is no evidence of acute fracture or dislocation. No evidence of femoral head avascular necrosis. The hip joint spaces are preserved. Mild sacroiliac degenerative changes are present bilaterally. There is mild lower lumbar spondylosis. A key overlying the lower lumbar spine is presumed to be external to the patient. Femoral atherosclerosis noted. IMPRESSION: No acute osseous findings at the right hip or pelvis. Electronically Signed   By: Carey Bullocks M.D.   On: 07/09/2021 17:55        Scheduled Meds:  aspirin EC  81 mg Oral Daily   atorvastatin  40 mg Oral Daily   citalopram  20 mg Oral Daily   collagenase   Topical Daily   enoxaparin (LOVENOX) injection  40  mg Subcutaneous q1600   ezetimibe  10 mg Oral Daily   gabapentin  300 mg Oral TID   insulin aspart  0-15 Units Subcutaneous TID WC   insulin aspart protamine- aspart  30 Units Subcutaneous BID WC   metFORMIN  500 mg Oral BID WC   metoprolol tartrate  12.5 mg Oral BID   nystatin   Topical BID   pantoprazole  40 mg Oral Daily   potassium chloride SA  20 mEq Oral Daily   Semaglutide  14 mg Oral Daily   torsemide  20 mg Oral BID   Continuous Infusions:   LOS: 0 days    Time spent:  Zannie Cove, MD [facility]   07/10/2021, 12:13 PM

## 2021-07-10 NOTE — Consult Note (Addendum)
WOC Nurse Consult Note: Reason for Consult: Consult requested for left arm, abd and breast skin folds, and sacrum/buttocks.  Pt was found down for many days in the bathtub prior to admission and the water was soiled with stool and urine. This resulted in wounds in several locations. Wound type: Sacrum/buttocks with Unstageable pressure injuries in 4 locations; 100% tightly adhered yellow slough. The entire scattered affected area is 6X6cm, and is surrounded by red moist macerated partial thickness skin loss; appearance is consistent with moisture associated skin damage Left arm with red scattered macular papular rash of unknown origin.  Partial thickness skin loss abrasion to left posterior arm; 3X3X.1cm, red and moist Abd and breast skin folds with red moist linear full thickness skin loss; appearance and location are consistent with intertrigo. Pressure Injury POA: Yes Topical treatment orders provided for bedside nurses to perform as follows:  1. Apply Santyl to yellow slough wounds on sacrum/buttocks Q day, then cover with moist gauze and foam dressings.  (Change foam dressings Q 3 days or PRN soiling.) 2. Foam dressing to left arm, change Q 3 days or PRN soiling 3. Apply antifungal powder to left arm rash and abd/breast skin folds TID. Please re-consult if further assistance is needed.  Thank-you,  Cammie Mcgee MSN, RN, CWOCN, [place], CNS [phone]

## 2021-07-11 DIAGNOSIS — W19XXXA Unspecified fall, initial encounter: Secondary | ICD-10-CM | POA: Diagnosis not present

## 2021-07-11 DIAGNOSIS — R55 Syncope and collapse: Secondary | ICD-10-CM | POA: Diagnosis not present

## 2021-07-11 DIAGNOSIS — Y92009 Unspecified place in unspecified non-institutional (private) residence as the place of occurrence of the external cause: Secondary | ICD-10-CM | POA: Diagnosis not present

## 2021-07-11 LAB — BASIC METABOLIC PANEL
Anion gap: 8 (ref 5–15)
BUN: 26 mg/dL — ABNORMAL HIGH (ref 8–23)
CO2: 27 mmol/L (ref 22–32)
Calcium: 8.3 mg/dL — ABNORMAL LOW (ref 8.9–10.3)
Chloride: 102 mmol/L (ref 98–111)
Creatinine, Ser: 0.9 mg/dL (ref 0.44–1.00)
GFR, Estimated: >60 mL/min (ref >60)
Glucose, Bld: 115 mg/dL — ABNORMAL HIGH (ref 70–99)
Potassium: 3.9 mmol/L (ref 3.5–5.1)
Sodium: 137 mmol/L (ref 135–145)

## 2021-07-11 LAB — CBC
HCT: 38.1 % (ref 36.0–46.0)
Hemoglobin: 12.4 g/dL (ref 12.0–15.0)
MCH: 27.9 pg (ref 26.0–34.0)
MCHC: 32.5 g/dL (ref 30.0–36.0)
MCV: 85.6 fL (ref 80.0–100.0)
Platelets: 189 10*3/uL (ref 150–400)
RBC: 4.45 MIL/uL (ref 3.87–5.11)
RDW: 13.9 % (ref 11.5–15.5)
WBC: 9.2 10*3/uL (ref 4.0–10.5)
nRBC: 0 % (ref 0.0–0.2)

## 2021-07-11 LAB — MRSA NEXT GEN BY PCR, NASAL: MRSA by PCR Next Gen: NOT DETECTED

## 2021-07-11 LAB — GLUCOSE, CAPILLARY
Glucose-Capillary: 104 mg/dL — ABNORMAL HIGH (ref 70–99)
Glucose-Capillary: 109 mg/dL — ABNORMAL HIGH (ref 70–99)
Glucose-Capillary: 171 mg/dL — ABNORMAL HIGH (ref 70–99)
Glucose-Capillary: 152 mg/dL — ABNORMAL HIGH (ref 70–99)
Glucose-Capillary: 143 mg/dL — ABNORMAL HIGH (ref 70–99)

## 2021-07-11 MED ORDER — INSULIN ASPART PROT & ASPART (70-30 MIX) 100 UNIT/ML ~~LOC~~ SUSP
20.0000 [IU] | Freq: Two times a day (BID) | SUBCUTANEOUS | Status: DC
  Administered 2021-07-11 – 2021-07-12 (×3): 20 [IU] via SUBCUTANEOUS
  Filled 2021-07-11: qty 10

## 2021-07-11 NOTE — Plan of Care (Signed)

## 2021-07-11 NOTE — Progress Notes (Signed)
PROGRESS NOTE    [name]  VPX:[id number] DOB: 1946-07-10 DOA: 07/09/2021 PCP: Myrlene Broker, MD  Brief Narrative: [name] is a 75 year old female with history of AS s/p TAVR in 2021, chronic diastolic CHF, insulin-dependent diabetes mellitus, chronic diabetic neuropathy, chronic venous insufficiency, chronic bilateral lower extremity lymphedema presented to the ED after a fall, difficulty ambulating. -Patient reportedly fell in her bathtub 4 days prior to admission, slipped fell on her back tried multiple times to get up but was unable to do so, she stayed in the bathtub for 3 to 4 days soiled in her urine and feces, drank tap water.  She denies any loss of consciousness, no focal weakness, complains of aches and pains all over, multiple bruises over her body.  In the ED labs and vital signs were unremarkable   Assessment & Plan:  Falls, ambulatory dysfunction Peripheral neuropathy, lymphedema Chronic dizziness -Clinically appears euvolemic, but blood pressure as low as 90s on occasions, suspect this could be contributing to her chronic dizziness, hold torsemide today, resume at lower dose at discharge -PT OT eval, SNF recommended, was stuck in her bathtub for 3 to 4 days without the ability to stand up, crawl out -Exam consistent with peripheral neuropathy, has deconditioning and multiple wounds from moisture associated skin damage -Agree with recommendations for short-term rehab -[facility] consult for placement -Discharge planning  Multiple pressure wounds Unstageable sacral decubitus wound Left arm maculopapular rash Partial-thickness skin loss, left arm -Pressure wounds and MASD -Continue wound care   Hypoalbuminemia -Likely from malnutrition, no history of liver disease, hyperbilirubinemia has resolved   Leukocytosis -Likely reactive, resolved   Chronic leg lymphedema -She was lying for last 4 days in a bathtub,. -Unna boots -Resume diuretics tomorrow    Chronic diastolic CHF NYHA class II -Clinically euvolemic, hold torsemide today   Insulin-dependent diabetes mellitus -With insulin resistance.  -CBG stable, will lower dose of insulin 70/30    DVT prophylaxis: Lovenox Code Status: Full code Family Communication: None at bedside Disposition Plan:  Status is: Observation  The patient will require care spanning > 2 midnights and should be moved to inpatient because: Unsafe d/c plan  Dispo: The patient is from: Home              Anticipated d/c is to: SNF              Patient currently is not medically stable to d/c.   Difficult to place patient No  Procedures:   Antimicrobials:    Subjective: -Feels okay overall  Objective: Vitals:   07/10/21 1335 07/10/21 2032 07/11/21 0724 07/11/21 1459  BP: (!) 104/54 (!) 91/50 137/63 130/63  Pulse: 80 83 85 79  Resp: 18  17 17   Temp: 98.2 F (36.8 C) 97.9 F (36.6 C) 98.2 F (36.8 C) 98.5 F (36.9 C)  TempSrc: Oral Oral Oral Oral  SpO2: 100% 98% 97% 98%  Weight:      Height:        Intake/Output Summary (Last 24 hours) at 07/11/2021 1551 Last data filed at 07/10/2021 2300 Gross per 24 hour  Intake 120 ml  Output --  Net 120 ml   Filed Weights   07/09/21 1349  Weight: 85.3 kg    Examination:  General exam: AAOx3, no distress HEENT: No JVD CVS: S1-S2, regular rate rhythm Abdomen: Soft, nontender, nondistended, bowel sounds present Extremities: No edema Skin: Sacral decubitus pressure wound with MASD noted and erythema in the left forearm as well  Psychiatry: Judgement and insight appear normal. Mood & affect appropriate.     Data Reviewed:   CBC: Recent Labs  Lab 07/09/21 1354 07/10/21 0212 07/11/21 0157  WBC 13.2* 10.2 9.2  NEUTROABS 10.3* 6.8  --   HGB 13.9 13.0 12.4  HCT 42.0 38.6 38.1  MCV 84.8 83.5 85.6  PLT 216 217 189   Basic Metabolic Panel: Recent Labs  Lab 07/09/21 1354 07/10/21 0212 07/11/21 0157  NA 135 136 137  K 3.7 3.8 3.9  CL  100 102 102  CO2 23 28 27   GLUCOSE 196* 181* 115*  BUN 20 22 26*  CREATININE 0.81 0.76 0.90  CALCIUM 8.9 8.8* 8.3*   GFR: Estimated Creatinine Clearance: 57.9 mL/min (by C-G formula based on SCr of 0.9 mg/dL). Liver Function Tests: Recent Labs  Lab 07/09/21 1354 07/10/21 0212  AST 22 24  ALT 21 19  ALKPHOS 128* 124  BILITOT 2.0* 0.7  PROT 6.3* 5.7*  ALBUMIN 2.4* 2.2*   No results for input(s): LIPASE, AMYLASE in the last 168 hours. No results for input(s): AMMONIA in the last 168 hours. Coagulation Profile: No results for input(s): INR, PROTIME in the last 168 hours. Cardiac Enzymes: Recent Labs  Lab 07/09/21 1354  CKTOTAL 133   BNP (last 3 results) Recent Labs    02/22/21 1536  PROBNP 55.0   HbA1C: No results for input(s): HGBA1C in the last 72 hours. CBG: Recent Labs  Lab 07/10/21 2254 07/10/21 2318 07/11/21 0604 07/11/21 0637 07/11/21 1117  GLUCAP 69* 94 104* 109* 171*   Lipid Profile: No results for input(s): CHOL, HDL, LDLCALC, TRIG, CHOLHDL, LDLDIRECT in the last 72 hours. Thyroid Function Tests: Recent Labs    07/09/21 2102  TSH 4.436   Anemia Panel: Recent Labs    07/09/21 2102  VITAMINB12 476   Urine analysis:    Component Value Date/Time   COLORURINE YELLOW 05/17/2020 0410   APPEARANCEUR CLEAR 05/17/2020 0410   LABSPEC 1.011 05/17/2020 0410   PHURINE 6.0 05/17/2020 0410   GLUCOSEU 150 (A) 05/17/2020 0410   HGBUR SMALL (A) 05/17/2020 0410   BILIRUBINUR NEGATIVE 05/17/2020 0410   BILIRUBINUR Neg 05/10/2019 1029   KETONESUR NEGATIVE 05/17/2020 0410   PROTEINUR NEGATIVE 05/17/2020 0410   UROBILINOGEN 0.2 05/10/2019 1029   UROBILINOGEN 0.2 12/19/2014 1425   NITRITE NEGATIVE 05/17/2020 0410   LEUKOCYTESUR NEGATIVE 05/17/2020 0410   Sepsis Labs: @LABRCNTIP (procalcitonin:4,lacticidven:4)  ) Recent Results (from the past 240 hour(s))  Resp Panel by RT-PCR (Flu A&B, Covid) Nasopharyngeal Swab     Status: None   Collection Time:  07/09/21  3:40 PM   Specimen: Nasopharyngeal Swab; Nasopharyngeal(NP) swabs in vial transport medium  Result Value Ref Range Status   SARS Coronavirus 2 by RT PCR NEGATIVE NEGATIVE Final    Comment: (NOTE) SARS-CoV-2 target nucleic acids are NOT DETECTED.  The SARS-CoV-2 RNA is generally detectable in upper respiratory specimens during the acute phase of infection. The lowest concentration of SARS-CoV-2 viral copies this assay can detect is 138 copies/mL. A negative result does not preclude SARS-Cov-2 infection and should not be used as the sole basis for treatment or other patient management decisions. A negative result may occur with  improper specimen collection/handling, submission of specimen other than nasopharyngeal swab, presence of viral mutation(s) within the areas targeted by this assay, and inadequate number of viral copies(<138 copies/mL). A negative result must be combined with clinical observations, patient history, and epidemiological information. The expected result is Negative.  Fact Sheet  for Patients:  BloggerCourse.com  Fact Sheet for Healthcare Providers:  [employer]iousBroker.it  This test is no t yet approved or cleared by the [place] FDA and  has been authorized for detection and/or diagnosis of SARS-CoV-2 by FDA under an Emergency Use Authorization (EUA). This EUA will remain  in effect (meaning this test can be used) for the duration of the COVID-19 declaration under Section 564(b)(1) of the Act, 21 U.S.C.section 360bbb-3(b)(1), unless the authorization is terminated  or revoked sooner.       Influenza A by PCR NEGATIVE NEGATIVE Final   Influenza B by PCR NEGATIVE NEGATIVE Final    Comment: (NOTE) The Xpert Xpress SARS-CoV-2/FLU/RSV plus assay is intended as an aid in the diagnosis of influenza from Nasopharyngeal swab specimens and should not be used as a sole basis for treatment. Nasal washings  and aspirates are unacceptable for Xpert Xpress SARS-CoV-2/FLU/RSV testing.  Fact Sheet for Patients: BloggerCourse.com  Fact Sheet for Healthcare Providers: SeriousBroker.it  This test is not yet approved or cleared by the [place] FDA and has been authorized for detection and/or diagnosis of SARS-CoV-2 by FDA under an Emergency Use Authorization (EUA). This EUA will remain in effect (meaning this test can be used) for the duration of the COVID-19 declaration under Section 564(b)(1) of the Act, 21 U.S.C. section 360bbb-3(b)(1), unless the authorization is terminated or revoked.  Performed at [facility], [address] [place], [place] [address]          Radiology Studies: DG Elbow 2 Views Left  Result Date: 07/09/2021 CLINICAL DATA:  Fall in bathtub 4 days ago.  Pressure sores. EXAM: LEFT ELBOW - 2 VIEW COMPARISON:  None. FINDINGS: The mineralization and alignment are normal. There is no evidence of acute fracture or dislocation. No evidence of elbow joint effusion. Mild spurring of the coronoid process. IMPRESSION: No acute osseous findings or elbow joint effusion. Electronically Signed   By: Carey Bullocks M.D.   On: 07/09/2021 17:51   DG HIP UNILAT WITH PELVIS 2-3 VIEWS LEFT  Result Date: 07/09/2021 CLINICAL DATA:  Fall in bathtub 4 days ago.  Pressure sores. EXAM: DG HIP (WITH OR WITHOUT PELVIS) 2-3V LEFT COMPARISON:  Radiograph 05/10/2020. FINDINGS: AP and frog-leg lateral views of the left hip. The mineralization and alignment are normal. There is no evidence of acute fracture or dislocation. No evidence of femoral head avascular necrosis. The hip joint spaces are preserved. Mild degenerative changes at the left sacroiliac joint. Femoral atherosclerosis noted. IMPRESSION: Stable left hip radiographs.  No acute osseous findings. Electronically Signed   By: Carey Bullocks M.D.   On: 07/09/2021 17:53   DG HIP  UNILAT WITH PELVIS 2-3 VIEWS RIGHT  Result Date: 07/09/2021 CLINICAL DATA:  Fall in bathtub 4 days ago.  Pressure sores. EXAM: DG HIP (WITH OR WITHOUT PELVIS) 2-3V RIGHT COMPARISON:  Radiographs 05/10/2020. FINDINGS: AP pelvis with AP and frog-leg lateral views of the right hip. The mineralization and alignment are normal. There is no evidence of acute fracture or dislocation. No evidence of femoral head avascular necrosis. The hip joint spaces are preserved. Mild sacroiliac degenerative changes are present bilaterally. There is mild lower lumbar spondylosis. A key overlying the lower lumbar spine is presumed to be external to the patient. Femoral atherosclerosis noted. IMPRESSION: No acute osseous findings at the right hip or pelvis. Electronically Signed   By: Carey Bullocks M.D.   On: 07/09/2021 17:55        Scheduled Meds:  aspirin EC  81 mg Oral Daily   atorvastatin  40 mg Oral Daily   citalopram  20 mg Oral Daily   collagenase   Topical Daily   enoxaparin (LOVENOX) injection  40 mg Subcutaneous q1600   ezetimibe  10 mg Oral Daily   gabapentin  300 mg Oral TID   insulin aspart  0-15 Units Subcutaneous TID WC   insulin aspart protamine- aspart  20 Units Subcutaneous BID WC   metFORMIN  500 mg Oral BID WC   metoprolol tartrate  12.5 mg Oral BID   nystatin   Topical BID   pantoprazole  40 mg Oral Daily   potassium chloride SA  20 mEq Oral Daily   Continuous Infusions:   LOS: 0 days    Time spent:  Zannie Cove, MD [facility]   07/11/2021, 3:51 PM

## 2021-07-11 NOTE — TOC Progression Note (Signed)
Transition of Care [facility]) - Progression Note    Patient Details  Name: Sussan Meter MRN: [id number] Date of Birth: 08/04/1946  [facility]) CM/SW Contact  Jimmy Picket, [place] Phone Number: 07/11/2021, 5:10 PM  Clinical Narrative:     CSW gave pt bed offers. Pt stated she will review them and have an answer tomorrow.   Expected Discharge Plan: Skilled Nursing Facility Barriers to Discharge: Continued Medical Work up  Expected Discharge Plan and Services Expected Discharge Plan: Skilled Nursing Facility   Discharge Planning Services: CM Consult   Living arrangements for the past 2 months: Single Family Home                                       Social Determinants of Health (SDOH) Interventions    Readmission Risk Interventions No flowsheet data found.  Jimmy Picket, Theresia Majors, [place] Clinical Social Worker [phone]

## 2021-07-12 DIAGNOSIS — W19XXXA Unspecified fall, initial encounter: Secondary | ICD-10-CM | POA: Diagnosis not present

## 2021-07-12 DIAGNOSIS — R55 Syncope and collapse: Secondary | ICD-10-CM | POA: Diagnosis not present

## 2021-07-12 DIAGNOSIS — Y92009 Unspecified place in unspecified non-institutional (private) residence as the place of occurrence of the external cause: Secondary | ICD-10-CM | POA: Diagnosis not present

## 2021-07-12 LAB — RESP PANEL BY RT-PCR (FLU A&B, COVID) ARPGX2
Influenza A by PCR: NEGATIVE
Influenza B by PCR: NEGATIVE
SARS Coronavirus 2 by RT PCR: NEGATIVE

## 2021-07-12 LAB — GLUCOSE, CAPILLARY
Glucose-Capillary: 183 mg/dL — ABNORMAL HIGH (ref 70–99)
Glucose-Capillary: 222 mg/dL — ABNORMAL HIGH (ref 70–99)
Glucose-Capillary: 138 mg/dL — ABNORMAL HIGH (ref 70–99)

## 2021-07-12 MED ORDER — INSULIN ASPART PROT & ASPART (70-30 MIX) 100 UNIT/ML ~~LOC~~ SUSP
25.0000 [IU] | Freq: Two times a day (BID) | SUBCUTANEOUS | Status: DC
  Administered 2021-07-12 – 2021-07-13 (×3): 25 [IU] via SUBCUTANEOUS
  Filled 2021-07-12: qty 10

## 2021-07-12 NOTE — Progress Notes (Signed)
Physical Therapy Treatment Patient Details Name: [name] MRN: [id number] DOB: [birth year] Today's Date: 07/12/2021    History of Present Illness [name] is a 75 y.o. female who presented to ED on 07/09/21 after slipping into her bathtub while washing her clothes, she was unable to get out of the tub for 4 days due to generalized weakness. X-ray (-) for acute fx/dislocation of elbow/B hip/pelvis. Pressure wounds on coccyx/sacrum and L elbow. Pt with a past medical history of diabetes, hypertension, hyperlipidemia, TAVR, chronic diastolic CHF LVEF 60 to 65%, HTN, IDDM with insulin resistance, chronic diabetic neuropathy, venous insufficiency and chronic bilateral leg lymphedema.    PT Comments    The pt is making great progress with mobility goals at this time. She was able to complete bed mobility with minA, and completed repeated sit-stands through the session with minA, increased time to power up, and use of RW. The pt did require repeated sequential cues for hand positioning and use of RW, but was able to maintain static standing with BUE support and minG as well as short bout of ambulation with minA. The pt is highly motivated to mobilize, and will benefit from mobility with all staff members as well as continued skilled PT to maximize functional strength, endurance, and stability.     Follow Up Recommendations  SNF     Equipment Recommendations  Other (comment)    Recommendations for Other Services       Precautions / Restrictions Precautions Precautions: Fall Precaution Comments: sacral wound, many areas of skin breakdwon Restrictions Weight Bearing Restrictions: No    Mobility  Bed Mobility Overal bed mobility: Needs Assistance Bed Mobility: Rolling;Sidelying to Sit Rolling: Min guard Sidelying to sit: Min assist       General bed mobility comments: minA to complete movement of LE and steady at trunk. pt needed assist and cues to scoot forward to  EOB    Transfers Overall transfer level: Needs assistance Equipment used: Rolling walker (2 wheeled) Transfers: Sit to/from Stand Sit to Stand: Min assist         General transfer comment: minA to power up, max cues for hand positioning to push up from armrests, sequential cues to reach for RW support  Ambulation/Gait Ambulation/Gait assistance: Min assist Gait Distance (Feet): 40 Feet Assistive device: Rolling walker (2 wheeled) Gait Pattern/deviations: Step-to pattern;Decreased stride length;Decreased stance time - left;Decreased dorsiflexion - right;Decreased dorsiflexion - left;Shuffle;Trunk flexed Gait velocity: decreased Gait velocity interpretation: <1.31 ft/sec, indicative of household ambulator General Gait Details: pt with minimal clearance bilaterally, maintaining L knee flexed even during stance, decreased stance time on LLE. max cues for posture, stride length, and clearance.     Balance Overall balance assessment: Needs assistance Sitting-balance support: Bilateral upper extremity supported Sitting balance-Leahy Scale: Poor Sitting balance - Comments: steady with no external support   Standing balance support: Bilateral upper extremity supported;During functional activity Standing balance-Leahy Scale: Poor Standing balance comment: reliant on BUE support to maintain                            Cognition Arousal/Alertness: Awake/alert Behavior During Therapy: WFL for tasks assessed/performed Overall Cognitive Status: No family/caregiver present to determine baseline cognitive functioning Area of Impairment: Memory;Safety/judgement;Problem solving                     Memory: Decreased short-term memory   Safety/Judgement: Decreased awareness of safety   Problem Solving: Decreased  initiation;Difficulty sequencing;Requires verbal cues General Comments: pt able to follow commands but needing increased time and VC for sequencing. Pt stating "I  don't think I know what you want me to do" when asked to "stand up" and then stating "I don't know where to put my feet or hands" max cues for sequencing but agreeable      Exercises      General Comments General comments (skin integrity, edema, etc.): replaced sacral pad on pt, old dressing was wet and soiled. RN alerted      Pertinent Vitals/Pain Pain Assessment: Faces Faces Pain Scale: Hurts little more Pain Location: significant pain to touch in sacral area Pain Descriptors / Indicators: Sore;Aching Pain Intervention(s): Limited activity within patient's tolerance;Monitored during session;Repositioned     PT Goals (current goals can now be found in the care plan section) Acute Rehab PT Goals Patient Stated Goal: Pt's goal is to return to independence PT Goal Formulation: With patient Time For Goal Achievement: 07/24/21 Potential to Achieve Goals: Good Progress towards PT goals: Progressing toward goals    Frequency    Min 2X/week      PT Plan Current plan remains appropriate    Co-evaluation              AM-PAC PT "6 Clicks" Mobility   Outcome Measure  Help needed turning from your back to your side while in a flat bed without using bedrails?: A Little Help needed moving from lying on your back to sitting on the side of a flat bed without using bedrails?: A Little Help needed moving to and from a bed to a chair (including a wheelchair)?: A Little Help needed standing up from a chair using your arms (e.g., wheelchair or bedside chair)?: A Little Help needed to walk in hospital room?: A Little Help needed climbing 3-5 steps with a railing? : A Lot 6 Click Score: 17    End of Session Equipment Utilized During Treatment: Gait belt Activity Tolerance: Patient tolerated treatment well;Patient limited by pain Patient left: in chair;with chair alarm set;with call bell/phone within reach Nurse Communication: Mobility status;Need for lift equipment PT Visit  Diagnosis: Unsteadiness on feet (R26.81);Muscle weakness (generalized) (M62.81);History of falling (Z91.81);Pain     Time: 9798-9211 PT Time Calculation (min) (ACUTE ONLY): 30 min  Charges:  $Gait Training: 8-22 mins $Therapeutic Activity: 8-22 mins                     [name], PT, DPT   Acute Rehabilitation Department Pager #: [phone]   [name] 07/12/2021, 4:41 PM

## 2021-07-12 NOTE — Progress Notes (Signed)
PROGRESS NOTE    [name]  SAY:[id number] DOB: 04-01-1946 DOA: 07/09/2021 PCP: Myrlene Broker, MD  Brief Narrative: [name] is a 75 year old female with history of AS s/p TAVR in 2021, chronic diastolic CHF, insulin-dependent diabetes mellitus, chronic diabetic neuropathy, chronic venous insufficiency, chronic bilateral lower extremity lymphedema presented to the ED after a fall, difficulty ambulating. -Patient reportedly fell in her bathtub 4 days prior to admission, slipped fell on her back tried multiple times to get up but was unable to do so, she stayed in the bathtub for 3 to 4 days soiled in her urine and feces, drank tap water.  She denies any loss of consciousness, no focal weakness, complains of aches and pains all over, multiple bruises over her body.  In the ED labs and vital signs were unremarkable   Assessment & Plan:  Falls, ambulatory dysfunction Peripheral neuropathy, lymphedema Chronic dizziness -Clinically appears euvolemic, but blood pressure as low as 90s on occasions, suspect this could be contributing to her chronic dizziness,  -Hold torsemide again today, decrease dose to 10 mg daily at discharge -PT OT eval, SNF recommended, was stuck in her bathtub for 3 to 4 days without the ability to stand up, crawl out -Exam consistent with peripheral neuropathy, has deconditioning and multiple wounds from moisture associated skin damage -Agree with recommendations for short-term rehab -TOC following, discharge planning  Multiple pressure wounds Unstageable sacral decubitus wound Left arm maculopapular rash Partial-thickness skin loss, left arm -Pressure wounds and MASD -Continue wound care   Hypoalbuminemia -Likely from malnutrition, no history of liver disease, hyperbilirubinemia has resolved   Leukocytosis -Likely reactive, resolved   Chronic leg lymphedema -She was lying for last 4 days in a bathtub,. -Unna boots -Resume torsemide  low-dose at discharge   Chronic diastolic CHF NYHA class II -Clinically euvolemic, hold torsemide today   Insulin-dependent diabetes mellitus -With insulin resistance.  -CBG stable, continue insulin 70/30, will increase dose   DVT prophylaxis: Lovenox Code Status: Full code Family Communication: None at bedside Disposition Plan:  Status is: Observation  The patient will require care spanning > 2 midnights and should be moved to inpatient because: Unsafe d/c plan  Dispo: The patient is from: Home              Anticipated d/c is to: SNF              Patient currently is medically stable to d/c.   Difficult to place patient No  Procedures:   Antimicrobials:    Subjective: -Sore all over but feels okay overall  Objective: Vitals:   07/11/21 0724 07/11/21 1459 07/11/21 2001 07/12/21 0900  BP: 137/63 130/63 (!) 142/85 136/68  Pulse: 85 79 91 78  Resp: 17 17 18 17   Temp: 98.2 F (36.8 C) 98.5 F (36.9 C) 99.6 F (37.6 C) 98.7 F (37.1 C)  TempSrc: Oral Oral Oral Oral  SpO2: 97% 98% 94% 99%  Weight:      Height:        Intake/Output Summary (Last 24 hours) at 07/12/2021 1246 Last data filed at 07/11/2021 1850 Gross per 24 hour  Intake --  Output 700 ml  Net -700 ml   Filed Weights   07/09/21 1349  Weight: 85.3 kg    Examination:  General exam: AAOx3, no distress HEENT: No JVD CVS: S1-S2, regular rate rhythm Abdomen: Soft, nontender, bowel sounds present Extremities: No edema  Skin: Sacral decubitus pressure wound with MASD noted and erythema in the  left forearm as well  Psychiatry: Judgement and insight appear normal. Mood & affect appropriate.     Data Reviewed:   CBC: Recent Labs  Lab 07/09/21 1354 07/10/21 0212 07/11/21 0157  WBC 13.2* 10.2 9.2  NEUTROABS 10.3* 6.8  --   HGB 13.9 13.0 12.4  HCT 42.0 38.6 38.1  MCV 84.8 83.5 85.6  PLT 216 217 189   Basic Metabolic Panel: Recent Labs  Lab 07/09/21 1354 07/10/21 0212 07/11/21 0157  NA  135 136 137  K 3.7 3.8 3.9  CL 100 102 102  CO2 23 28 27   GLUCOSE 196* 181* 115*  BUN 20 22 26*  CREATININE 0.81 0.76 0.90  CALCIUM 8.9 8.8* 8.3*   GFR: Estimated Creatinine Clearance: 57.9 mL/min (by C-G formula based on SCr of 0.9 mg/dL). Liver Function Tests: Recent Labs  Lab 07/09/21 1354 07/10/21 0212  AST 22 24  ALT 21 19  ALKPHOS 128* 124  BILITOT 2.0* 0.7  PROT 6.3* 5.7*  ALBUMIN 2.4* 2.2*   No results for input(s): LIPASE, AMYLASE in the last 168 hours. No results for input(s): AMMONIA in the last 168 hours. Coagulation Profile: No results for input(s): INR, PROTIME in the last 168 hours. Cardiac Enzymes: Recent Labs  Lab 07/09/21 1354  CKTOTAL 133   BNP (last 3 results) Recent Labs    02/22/21 1536  PROBNP 55.0   HbA1C: No results for input(s): HGBA1C in the last 72 hours. CBG: Recent Labs  Lab 07/11/21 1117 07/11/21 1615 07/11/21 2013 07/12/21 0746 07/12/21 1123  GLUCAP 171* 152* 143* 183* 222*   Lipid Profile: No results for input(s): CHOL, HDL, LDLCALC, TRIG, CHOLHDL, LDLDIRECT in the last 72 hours. Thyroid Function Tests: Recent Labs    07/09/21 2102  TSH 4.436   Anemia Panel: Recent Labs    07/09/21 2102  VITAMINB12 476   Urine analysis:    Component Value Date/Time   COLORURINE YELLOW 05/17/2020 0410   APPEARANCEUR CLEAR 05/17/2020 0410   LABSPEC 1.011 05/17/2020 0410   PHURINE 6.0 05/17/2020 0410   GLUCOSEU 150 (A) 05/17/2020 0410   HGBUR SMALL (A) 05/17/2020 0410   BILIRUBINUR NEGATIVE 05/17/2020 0410   BILIRUBINUR Neg 05/10/2019 1029   KETONESUR NEGATIVE 05/17/2020 0410   PROTEINUR NEGATIVE 05/17/2020 0410   UROBILINOGEN 0.2 05/10/2019 1029   UROBILINOGEN 0.2 12/19/2014 1425   NITRITE NEGATIVE 05/17/2020 0410   LEUKOCYTESUR NEGATIVE 05/17/2020 0410   Sepsis Labs: @LABRCNTIP (procalcitonin:4,lacticidven:4)  ) Recent Results (from the past 240 hour(s))  Resp Panel by RT-PCR (Flu A&B, Covid) Nasopharyngeal Swab      Status: None   Collection Time: 07/09/21  3:40 PM   Specimen: Nasopharyngeal Swab; Nasopharyngeal(NP) swabs in vial transport medium  Result Value Ref Range Status   SARS Coronavirus 2 by RT PCR NEGATIVE NEGATIVE Final    Comment: (NOTE) SARS-CoV-2 target nucleic acids are NOT DETECTED.  The SARS-CoV-2 RNA is generally detectable in upper respiratory specimens during the acute phase of infection. The lowest concentration of SARS-CoV-2 viral copies this assay can detect is 138 copies/mL. A negative result does not preclude SARS-Cov-2 infection and should not be used as the sole basis for treatment or other patient management decisions. A negative result may occur with  improper specimen collection/handling, submission of specimen other than nasopharyngeal swab, presence of viral mutation(s) within the areas targeted by this assay, and inadequate number of viral copies(<138 copies/mL). A negative result must be combined with clinical observations, patient history, and epidemiological information. The expected result  is Negative.  Fact Sheet for Patients:  BloggerCourse.com  Fact Sheet for Healthcare Providers:  SeriousBroker.it  This test is no t yet approved or cleared by the [place] FDA and  has been authorized for detection and/or diagnosis of SARS-CoV-2 by FDA under an Emergency Use Authorization (EUA). This EUA will remain  in effect (meaning this test can be used) for the duration of the COVID-19 declaration under Section 564(b)(1) of the Act, 21 U.S.C.section 360bbb-3(b)(1), unless the authorization is terminated  or revoked sooner.       Influenza A by PCR NEGATIVE NEGATIVE Final   Influenza B by PCR NEGATIVE NEGATIVE Final    Comment: (NOTE) The Xpert Xpress SARS-CoV-2/FLU/RSV plus assay is intended as an aid in the diagnosis of influenza from Nasopharyngeal swab specimens and should not be used as a sole basis  for treatment. Nasal washings and aspirates are unacceptable for Xpert Xpress SARS-CoV-2/FLU/RSV testing.  Fact Sheet for Patients: BloggerCourse.com  Fact Sheet for Healthcare Providers: SeriousBroker.it  This test is not yet approved or cleared by the [place] FDA and has been authorized for detection and/or diagnosis of SARS-CoV-2 by FDA under an Emergency Use Authorization (EUA). This EUA will remain in effect (meaning this test can be used) for the duration of the COVID-19 declaration under Section 564(b)(1) of the Act, 21 U.S.C. section 360bbb-3(b)(1), unless the authorization is terminated or revoked.  Performed at [facility], [address], [place], [place] [address]   MRSA Next Gen by PCR, Nasal     Status: None   Collection Time: 07/11/21  6:31 PM   Specimen: Nasal Mucosa; Nasal Swab  Result Value Ref Range Status   MRSA by PCR Next Gen NOT DETECTED NOT DETECTED Final    Comment: (NOTE) The GeneXpert MRSA Assay (FDA approved for NASAL specimens only), is one component of a comprehensive MRSA colonization surveillance program. It is not intended to diagnose MRSA infection nor to guide or monitor treatment for MRSA infections. Test performance is not FDA approved in patients less than 50 years old. Performed at [facility], [address], [place], [place] [address]     Radiology Studies: No results found.  Scheduled Meds:  aspirin EC  81 mg Oral Daily   atorvastatin  40 mg Oral Daily   citalopram  20 mg Oral Daily   collagenase   Topical Daily   enoxaparin (LOVENOX) injection  40 mg Subcutaneous q1600   ezetimibe  10 mg Oral Daily   gabapentin  300 mg Oral TID   insulin aspart  0-15 Units Subcutaneous TID WC   insulin aspart protamine- aspart  20 Units Subcutaneous BID WC   metFORMIN  500 mg Oral BID WC   metoprolol tartrate  12.5 mg Oral BID   nystatin   Topical BID   pantoprazole  40  mg Oral Daily   potassium chloride SA  20 mEq Oral Daily   Continuous Infusions:   LOS: 0 days    Time spent:  Zannie Cove, MD [facility]   07/12/2021, 12:46 PM

## 2021-07-12 NOTE — TOC Progression Note (Signed)
Transition of Care [facility]) - Progression Note    Patient Details  Name: [name] MRN: [id number] Date of Birth: 20-Feb-1946  [facility]) CM/SW Contact  Milinda Antis, LCSWA Phone Number: 07/12/2021, 12:29 PM  Clinical Narrative:    CSW met with the patient at bedside and inquired about facility choice.  The patient choose [facility].  CSW contacted Juliann Pulse with [facility] to verify that the facility  could accept the patient.  Juliann Pulse confirmed.  They facility will be able to accept the patient tomorrow.    CSW contacted [employer] (insurance company) and updated bed choice and requested that pre authorization be extended.     Expected Discharge Plan: [place] Barriers to Discharge: Continued Medical Work up  Expected Discharge Plan and Services Expected Discharge Plan: [place]   Discharge Planning Services: CM Consult   Living arrangements for the past 2 months: Single Family Home                                       Social Determinants of Health (SDOH) Interventions    Readmission Risk Interventions No flowsheet data found.

## 2021-07-13 DIAGNOSIS — I350 Nonrheumatic aortic (valve) stenosis: Secondary | ICD-10-CM | POA: Diagnosis not present

## 2021-07-13 DIAGNOSIS — E46 Unspecified protein-calorie malnutrition: Secondary | ICD-10-CM | POA: Diagnosis not present

## 2021-07-13 DIAGNOSIS — R770 Abnormality of albumin: Secondary | ICD-10-CM | POA: Diagnosis not present

## 2021-07-13 DIAGNOSIS — I110 Hypertensive heart disease with heart failure: Secondary | ICD-10-CM | POA: Diagnosis not present

## 2021-07-13 DIAGNOSIS — I69891 Dysphagia following other cerebrovascular disease: Secondary | ICD-10-CM | POA: Diagnosis not present

## 2021-07-13 DIAGNOSIS — W19XXXA Unspecified fall, initial encounter: Secondary | ICD-10-CM | POA: Diagnosis not present

## 2021-07-13 DIAGNOSIS — Z952 Presence of prosthetic heart valve: Secondary | ICD-10-CM | POA: Diagnosis not present

## 2021-07-13 DIAGNOSIS — L89310 Pressure ulcer of right buttock, unstageable: Secondary | ICD-10-CM | POA: Diagnosis not present

## 2021-07-13 DIAGNOSIS — R269 Unspecified abnormalities of gait and mobility: Secondary | ICD-10-CM | POA: Diagnosis not present

## 2021-07-13 DIAGNOSIS — B372 Candidiasis of skin and nail: Secondary | ICD-10-CM | POA: Diagnosis not present

## 2021-07-13 DIAGNOSIS — E1169 Type 2 diabetes mellitus with other specified complication: Secondary | ICD-10-CM | POA: Diagnosis not present

## 2021-07-13 DIAGNOSIS — Z23 Encounter for immunization: Secondary | ICD-10-CM | POA: Diagnosis not present

## 2021-07-13 DIAGNOSIS — J45909 Unspecified asthma, uncomplicated: Secondary | ICD-10-CM | POA: Diagnosis not present

## 2021-07-13 DIAGNOSIS — R55 Syncope and collapse: Secondary | ICD-10-CM | POA: Diagnosis not present

## 2021-07-13 DIAGNOSIS — Z794 Long term (current) use of insulin: Secondary | ICD-10-CM | POA: Diagnosis not present

## 2021-07-13 DIAGNOSIS — Z20822 Contact with and (suspected) exposure to covid-19: Secondary | ICD-10-CM | POA: Diagnosis not present

## 2021-07-13 DIAGNOSIS — R531 Weakness: Secondary | ICD-10-CM | POA: Diagnosis not present

## 2021-07-13 DIAGNOSIS — M7062 Trochanteric bursitis, left hip: Secondary | ICD-10-CM | POA: Diagnosis not present

## 2021-07-13 DIAGNOSIS — Z7982 Long term (current) use of aspirin: Secondary | ICD-10-CM | POA: Diagnosis not present

## 2021-07-13 DIAGNOSIS — L89150 Pressure ulcer of sacral region, unstageable: Secondary | ICD-10-CM | POA: Diagnosis not present

## 2021-07-13 DIAGNOSIS — M6281 Muscle weakness (generalized): Secondary | ICD-10-CM | POA: Diagnosis not present

## 2021-07-13 DIAGNOSIS — R42 Dizziness and giddiness: Secondary | ICD-10-CM | POA: Diagnosis not present

## 2021-07-13 DIAGNOSIS — R600 Localized edema: Secondary | ICD-10-CM | POA: Diagnosis not present

## 2021-07-13 DIAGNOSIS — Z9181 History of falling: Secondary | ICD-10-CM | POA: Diagnosis not present

## 2021-07-13 DIAGNOSIS — R2689 Other abnormalities of gait and mobility: Secondary | ICD-10-CM | POA: Diagnosis not present

## 2021-07-13 DIAGNOSIS — I10 Essential (primary) hypertension: Secondary | ICD-10-CM | POA: Diagnosis not present

## 2021-07-13 DIAGNOSIS — L98411 Non-pressure chronic ulcer of buttock limited to breakdown of skin: Secondary | ICD-10-CM | POA: Diagnosis not present

## 2021-07-13 DIAGNOSIS — E785 Hyperlipidemia, unspecified: Secondary | ICD-10-CM | POA: Diagnosis not present

## 2021-07-13 DIAGNOSIS — R3915 Urgency of urination: Secondary | ICD-10-CM | POA: Diagnosis not present

## 2021-07-13 DIAGNOSIS — E8809 Other disorders of plasma-protein metabolism, not elsewhere classified: Secondary | ICD-10-CM | POA: Diagnosis not present

## 2021-07-13 DIAGNOSIS — R21 Rash and other nonspecific skin eruption: Secondary | ICD-10-CM | POA: Diagnosis not present

## 2021-07-13 DIAGNOSIS — I890 Lymphedema, not elsewhere classified: Secondary | ICD-10-CM | POA: Diagnosis not present

## 2021-07-13 DIAGNOSIS — Z79899 Other long term (current) drug therapy: Secondary | ICD-10-CM | POA: Diagnosis not present

## 2021-07-13 DIAGNOSIS — I5032 Chronic diastolic (congestive) heart failure: Secondary | ICD-10-CM | POA: Diagnosis not present

## 2021-07-13 DIAGNOSIS — J302 Other seasonal allergic rhinitis: Secondary | ICD-10-CM | POA: Diagnosis not present

## 2021-07-13 DIAGNOSIS — Z7401 Bed confinement status: Secondary | ICD-10-CM | POA: Diagnosis not present

## 2021-07-13 DIAGNOSIS — E1140 Type 2 diabetes mellitus with diabetic neuropathy, unspecified: Secondary | ICD-10-CM | POA: Diagnosis not present

## 2021-07-13 DIAGNOSIS — Z7984 Long term (current) use of oral hypoglycemic drugs: Secondary | ICD-10-CM | POA: Diagnosis not present

## 2021-07-13 DIAGNOSIS — D72829 Elevated white blood cell count, unspecified: Secondary | ICD-10-CM | POA: Diagnosis not present

## 2021-07-13 DIAGNOSIS — R350 Frequency of micturition: Secondary | ICD-10-CM | POA: Diagnosis not present

## 2021-07-13 DIAGNOSIS — Y92009 Unspecified place in unspecified non-institutional (private) residence as the place of occurrence of the external cause: Secondary | ICD-10-CM | POA: Diagnosis not present

## 2021-07-13 DIAGNOSIS — Z743 Need for continuous supervision: Secondary | ICD-10-CM | POA: Diagnosis not present

## 2021-07-13 DIAGNOSIS — R296 Repeated falls: Secondary | ICD-10-CM | POA: Diagnosis not present

## 2021-07-13 DIAGNOSIS — E119 Type 2 diabetes mellitus without complications: Secondary | ICD-10-CM | POA: Diagnosis not present

## 2021-07-13 DIAGNOSIS — R6889 Other general symptoms and signs: Secondary | ICD-10-CM | POA: Diagnosis not present

## 2021-07-13 DIAGNOSIS — I872 Venous insufficiency (chronic) (peripheral): Secondary | ICD-10-CM | POA: Diagnosis not present

## 2021-07-13 DIAGNOSIS — K219 Gastro-esophageal reflux disease without esophagitis: Secondary | ICD-10-CM | POA: Diagnosis not present

## 2021-07-13 LAB — BASIC METABOLIC PANEL
Anion gap: 6 (ref 5–15)
BUN: 17 mg/dL (ref 8–23)
CO2: 29 mmol/L (ref 22–32)
Calcium: 8.4 mg/dL — ABNORMAL LOW (ref 8.9–10.3)
Chloride: 101 mmol/L (ref 98–111)
Creatinine, Ser: 0.67 mg/dL (ref 0.44–1.00)
GFR, Estimated: >60 mL/min (ref >60)
Glucose, Bld: 105 mg/dL — ABNORMAL HIGH (ref 70–99)
Potassium: 4.3 mmol/L (ref 3.5–5.1)
Sodium: 136 mmol/L (ref 135–145)

## 2021-07-13 LAB — GLUCOSE, CAPILLARY
Glucose-Capillary: 93 mg/dL (ref 70–99)
Glucose-Capillary: 230 mg/dL — ABNORMAL HIGH (ref 70–99)
Glucose-Capillary: 213 mg/dL — ABNORMAL HIGH (ref 70–99)

## 2021-07-13 MED ORDER — GABAPENTIN 300 MG PO CAPS
300.0000 mg | ORAL_CAPSULE | Freq: Two times a day (BID) | ORAL | 3 refills | Status: DC
Start: 2021-07-13 — End: 2022-04-12

## 2021-07-13 MED ORDER — INSULIN NPH ISOPHANE & REGULAR (70-30) 100 UNIT/ML ~~LOC~~ SUSP: 28.0000 [IU] | Freq: Two times a day (BID) | SUBCUTANEOUS | Status: DC

## 2021-07-13 MED ORDER — POTASSIUM CHLORIDE ER 20 MEQ PO TBCR: 20.0000 meq | EXTENDED_RELEASE_TABLET | Freq: Every day | ORAL | Status: DC

## 2021-07-13 MED ORDER — COVID-19 MRNA VACC (MODERNA) 50 MCG/0.25ML IM SUSP
0.2500 mL | Freq: Once | INTRAMUSCULAR | Status: AC
  Administered 2021-07-13: 0.25 mL via INTRAMUSCULAR
  Filled 2021-07-13: qty 0.25

## 2021-07-13 MED ORDER — TORSEMIDE 10 MG PO TABS: 20.0000 mg | ORAL_TABLET | Freq: Every day | ORAL | Status: DC

## 2021-07-13 NOTE — Progress Notes (Signed)
Occupational Therapy Treatment Patient Details Name: [name] MRN: [id number] DOB: 1946-10-01 Today's Date: 07/13/2021    History of present illness [name] is a 75 y.o. female who presented to ED on 07/09/21 after slipping into her bathtub while washing her clothes, she was unable to get out of the tub for 4 days due to generalized weakness. X-ray (-) for acute fx/dislocation of elbow/B hip/pelvis. Pressure wounds on coccyx/sacrum and L elbow. Pt with a past medical history of diabetes, hypertension, hyperlipidemia, TAVR, chronic diastolic CHF LVEF 60 to 65%, HTN, IDDM with insulin resistance, chronic diabetic neuropathy, venous insufficiency and chronic bilateral leg lymphedema.   OT comments  Pt making progress with OT goals. This session pt transferred with mod A to power up to standing and steady herself. Additionally, pt completed LB dressing with mod A, only requiring assist with standing to finish pulling up her underwear and pants. Pt continuing to have decreased activity tolerance, requiring rest breaks frequently. OT will continue to follow acutely.    Follow Up Recommendations  SNF;Supervision/Assistance - 24 hour    Equipment Recommendations  3 in 1 bedside commode;Tub/shower seat    Recommendations for Other Services      Precautions / Restrictions Precautions Precautions: Fall Precaution Comments: sacral wound, many areas of skin breakdwon Restrictions Weight Bearing Restrictions: No       Mobility Bed Mobility Overal bed mobility: Needs Assistance Bed Mobility: Rolling;Sidelying to Sit Rolling: Supervision Sidelying to sit: Min guard       General bed mobility comments: increased time to come to sitting, with use of bed rails.    Transfers Overall transfer level: Needs assistance Equipment used: Rolling walker (2 wheeled) Transfers: Sit to/from Stand Sit to Stand: Min assist         General transfer comment: minA to power up,  max cues for hand positioning to push up from armrests, sequential cues to reach for RW support    Balance Overall balance assessment: Needs assistance Sitting-balance support: Bilateral upper extremity supported Sitting balance-Leahy Scale: Fair Sitting balance - Comments: steady with no external support   Standing balance support: Bilateral upper extremity supported;During functional activity Standing balance-Leahy Scale: Poor Standing balance comment: reliant on BUE support to maintain                           ADL either performed or assessed with clinical judgement   ADL Overall ADL's : Needs assistance/impaired                 Upper Body Dressing : Supervision/safety;Sitting Upper Body Dressing Details (indicate cue type and reason): sup for safety Lower Body Dressing: Moderate assistance;Sitting/lateral leans;Sit to/from stand Lower Body Dressing Details (indicate cue type and reason): Pt donned socks, underwear and pants, able to get them above her knees sitting, then required mod A for standing and steading to complete donning of clothing items Toilet Transfer: Moderate assistance;Stand-pivot Toilet Transfer Details (indicate cue type and reason): Mod A to come to standing and steady, able to take short shuffle steps forward and to the side to transfer to bsc         Functional mobility during ADLs: Moderate assistance;Rolling walker General ADL Comments: Mod A due to weakness with transfers and OOB tasks, seated pt is able to complete most tasks, however without posterior support, pt can only maintain upright sitting for 5 mins before fatiguing.     Vision  Perception     Praxis      Cognition Arousal/Alertness: Awake/alert Behavior During Therapy: WFL for tasks assessed/performed Overall Cognitive Status: No family/caregiver present to determine baseline cognitive functioning                                 General Comments:  Pt follows most commands with ease, however, despite past verbal cueing and reports of using a RW PTA, pt continued to have poor handplacement on RW.        Exercises     Shoulder Instructions       General Comments BLE swelling, increased tenderness in LLE    Pertinent Vitals/ Pain       Pain Assessment: Faces Faces Pain Scale: Hurts little more Pain Location: significant pain to touch in sacral area and LLE Pain Descriptors / Indicators: Sore;Aching;Jabbing Pain Intervention(s): Limited activity within patient's tolerance;Monitored during session;Repositioned  Home Living                                          Prior Functioning/Environment              Frequency  Min 2X/week        Progress Toward Goals  OT Goals(current goals can now be found in the care plan section)  Progress towards OT goals: Progressing toward goals  Acute Rehab OT Goals Patient Stated Goal: Pt's goal is to return to independence OT Goal Formulation: With patient Time For Goal Achievement: 07/24/21 Potential to Achieve Goals: Fair ADL Goals Pt Will Perform Grooming: with supervision;standing Pt Will Perform Upper Body Bathing: with supervision;sitting Pt Will Perform Lower Body Bathing: with min guard assist;sit to/from stand Pt Will Perform Upper Body Dressing: with set-up;sitting Pt Will Perform Lower Body Dressing: with min guard assist;sit to/from stand Pt Will Transfer to Toilet: with min guard assist;stand pivot transfer;bedside commode Pt Will Perform Toileting - Clothing Manipulation and hygiene: with min guard assist;sit to/from stand Pt/caregiver will Perform Home Exercise Program: Increased strength;Both right and left upper extremity;With Supervision;With written HEP provided  Plan Discharge plan remains appropriate;Frequency remains appropriate    Co-evaluation                 AM-PAC OT "6 Clicks" Daily Activity     Outcome Measure   Help  from another person eating meals?: None Help from another person taking care of personal grooming?: A Little Help from another person toileting, which includes using toliet, bedpan, or urinal?: A Lot Help from another person bathing (including washing, rinsing, drying)?: A Lot Help from another person to put on and taking off regular upper body clothing?: A Little Help from another person to put on and taking off regular lower body clothing?: A Lot 6 Click Score: 16    End of Session Equipment Utilized During Treatment: Gait belt;Rolling walker  OT Visit Diagnosis: Other abnormalities of gait and mobility (R26.89);Muscle weakness (generalized) (M62.81);History of falling (Z91.81);Pain Pain - Right/Left: Left Pain - part of body: Ankle and joints of foot (back)   Activity Tolerance Patient tolerated treatment well;Patient limited by fatigue   Patient Left in chair;with call bell/phone within reach;with chair alarm set   Nurse Communication Mobility status        Time: 5974-1638 OT Time Calculation (min): 33 min  Charges: OT General Charges $  OT Visit: 1 Visit OT Treatments $Self Care/Home Management : 23-37 mins  [name] H., OTR/L Acute Rehabilitation  Madelynne Lasker Elane Bing Plume 07/13/2021, 2:40 PM

## 2021-07-13 NOTE — Plan of Care (Signed)

## 2021-07-13 NOTE — Progress Notes (Signed)
Patient transferred by [facility] to SNF. Patient is alert. VS stable. Denies pain and dressing change done.

## 2021-07-13 NOTE — Discharge Summary (Signed)
Physician Discharge [name] ZOX:[id number] DOB: 1946/05/19 DOA: 07/09/2021  PCP: Myrlene Broker, MD  Admit date: 07/09/2021 Discharge date: 07/13/2021  Time spent: 35 minutes  Recommendations for Outpatient Follow-up:  PCP in 1 week SNF for short-term rehab Titrate insulin dose  Discharge Diagnoses:    Fall at home Chronic diastolic CHF Chronic lymphedema Insulin-dependent diabetes mellitus Hypoalbuminemia Sacral decubitus wound Multiple pressure wounds, MASD Peripheral neuropathy Deconditioning  Discharge Condition: Stable  Diet recommendation: Diabetic, low-sodium  Filed Weights   07/09/21 1349  Weight: 85.3 kg    History of present illness:  [name] is a 75 year old female with history of AS s/p TAVR in 2021, chronic diastolic CHF, insulin-dependent diabetes mellitus, chronic diabetic neuropathy, chronic venous insufficiency, chronic bilateral lower extremity lymphedema presented to the ED after a fall, difficulty ambulating. -Patient reportedly fell in her bathtub 4 days prior to admission, slipped fell on her back tried multiple times to get up but was unable to do so, she stayed in the bathtub for 3 to 4 days soiled in her urine and feces, drank tap water.  She denies any loss of consciousness, no focal weakness, complains of aches and pains all over, multiple bruises over her body.  In the ED labs and vital signs were unremarkable    Hospital Course:   Falls, ambulatory dysfunction Peripheral neuropathy, lymphedema Chronic dizziness -Clinically appeared euvolemic, but blood pressure as low as 90s on occasions, suspect this could be contributing to her chronic dizziness,  -Held torsemide on admission and dose decreased to 10 mg daily at discharge -PT OT eval, SNF recommended, was stuck in her bathtub for 3 to 4 days prior to admission without the ability to stand up, crawl out -Exam consistent with peripheral neuropathy, has  deconditioning and multiple wounds from moisture associated skin damage -Plan for discharge to SNF for short-term rehab   Multiple pressure wounds Unstageable sacral decubitus wound Left arm maculopapular rash Partial-thickness skin loss, left arm -Pressure wounds and MASD -Continue wound care   Hypoalbuminemia -Likely from malnutrition, no history of liver disease, hyperbilirubinemia has resolved   Leukocytosis -Likely reactive, resolved   Chronic leg lymphedema -She was lying for last 4 days in a bathtub,. -Unna boots -Resume torsemide 10 mg daily at discharge, dose decreased   Chronic diastolic CHF NYHA class II -Clinically euvolemic, continue torsemide   Insulin-dependent diabetes mellitus -With insulin resistance. -CBG stable, continue insulin 70/30, dose decreased, titrate further at rehab    Discharge Exam: Vitals:   07/13/21 0735 07/13/21 1110  BP: 97/71 (!) 112/55  Pulse: 98 75  Resp: 18 19  Temp: 98.5 F (36.9 C) 98.1 F (36.7 C)  SpO2: 97% 96%    General:AAOx3 Cardiovascular:S1S2/RRR Respiratory: CTAB  Discharge Instructions   Discharge Instructions     Diet - low sodium heart healthy   Complete by: As directed    Discharge wound care:   Complete by: As directed    1. Apply Santyl to yellow slough wounds on sacrum/buttocks Q day, then cover with moist gauze and foam dressings.  (Change foam dressings Q 3 days or PRN soiling.) 2. Foam dressing to left arm, change Q 3 days or PRN soiling 3. Apply antifungal powder to left arm rash and abd/breast skin folds TID   Increase activity slowly   Complete by: As directed       Allergies as of 07/13/2021       Reactions   Morphine Other (See Comments)   'took  me out of this world' I had to be resuscitated   Codeine Sulfate Nausea Only, Other (See Comments)   GI upset and pain   Demerol [meperidine] Nausea And Vomiting   Propoxyphene Hcl Other (See Comments)   Darvocet caused sick headache         Medication List     TAKE these medications    acetaminophen 325 MG tablet Commonly known as: TYLENOL Take 325-650 mg by mouth daily as needed for mild pain or headache.   ASPERCREME LIDOCAINE EX Apply 1 application topically 5 (five) times daily as needed (pain).   aspirin 81 MG tablet Take 81 mg by mouth daily.   atorvastatin 40 MG tablet Commonly known as: Lipitor Take 1 tablet (40 mg total) by mouth daily.   CLEAR EYES OP Place 1 drop into both eyes daily as needed (dry eyes/itching).   ezetimibe 10 MG tablet Commonly known as: ZETIA Take 1 tablet (10 mg total) by mouth daily.   FreeStyle [employer] Misc Use to monitor sugars   gabapentin 300 MG capsule Commonly known as: NEURONTIN Take 1 capsule (300 mg total) by mouth 2 (two) times daily. What changed: when to take this   insulin NPH-regular Human (70-30) 100 UNIT/ML injection Inject 28 Units into the skin 2 (two) times daily with a meal. What changed: how much to take   Insulin Pen Needle 31G X 8 MM Misc Use to inject insulin four times daily E11.41   metoprolol tartrate 25 MG tablet Commonly known as: LOPRESSOR Take 0.5 tablets (12.5 mg total) by mouth 2 (two) times daily.   nystatin powder Commonly known as: MYCOSTATIN/NYSTOP Apply 1 application topically 3 (three) times daily.   OneTouch Verio test strip Generic drug: glucose blood USE AS DIRECTED   Potassium Chloride ER 20 MEQ Tbcr Take 20 mEq by mouth daily with lunch.   Rybelsus 14 MG Tabs Generic drug: Semaglutide Take 14 mg by mouth daily.   torsemide 10 MG tablet Commonly known as: Demadex Take 2 tablets (20 mg total) by mouth daily. What changed:  medication strength when to take this       ASK your doctor about these medications    amoxicillin 500 MG tablet Commonly known as: AMOXIL Take 4 tablets (2,000 mg total) by mouth as directed. 1 hour prior to dental work including cleanings   citalopram 20 MG  tablet Commonly known as: CELEXA TAKE 1 TABLET BY MOUTH  DAILY   esomeprazole 20 MG capsule Commonly known as: NexIUM Take 1 capsule (20 mg total) by mouth daily at 12 noon.   metFORMIN 1000 MG tablet Commonly known as: GLUCOPHAGE TAKE ONE-HALF TABLET BY  MOUTH TWICE DAILY WITH A  MEAL   sodium chloride 0.65 % Soln nasal spray Commonly known as: OCEAN Place 1 spray into both nostrils 4 (four) times daily.               Discharge Care Instructions  (From admission, onward)           Start     Ordered   07/13/21 0000  Discharge wound care:       Comments: 1. Apply Santyl to yellow slough wounds on sacrum/buttocks Q day, then cover with moist gauze and foam dressings.  (Change foam dressings Q 3 days or PRN soiling.) 2. Foam dressing to left arm, change Q 3 days or PRN soiling 3. Apply antifungal powder to left arm rash and abd/breast skin folds TID   07/13/21  1122           Allergies  Allergen Reactions   Morphine Other (See Comments)    'took me out of this world' I had to be resuscitated   Codeine Sulfate Nausea Only and Other (See Comments)    GI upset and pain   Demerol [Meperidine] Nausea And Vomiting   Propoxyphene Hcl Other (See Comments)    Darvocet caused sick headache      The results of significant diagnostics from this hospitalization (including imaging, microbiology, ancillary and laboratory) are listed below for reference.    Significant Diagnostic Studies: DG Elbow 2 Views Left  Result Date: 07/09/2021 CLINICAL DATA:  Fall in bathtub 4 days ago.  Pressure sores. EXAM: LEFT ELBOW - 2 VIEW COMPARISON:  None. FINDINGS: The mineralization and alignment are normal. There is no evidence of acute fracture or dislocation. No evidence of elbow joint effusion. Mild spurring of the coronoid process. IMPRESSION: No acute osseous findings or elbow joint effusion. Electronically Signed   By: Carey BullocksWilliam  Veazey M.D.   On: 07/09/2021 17:51   DG HIP UNILAT WITH  PELVIS 2-3 VIEWS LEFT  Result Date: 07/09/2021 CLINICAL DATA:  Fall in bathtub 4 days ago.  Pressure sores. EXAM: DG HIP (WITH OR WITHOUT PELVIS) 2-3V LEFT COMPARISON:  Radiograph 05/10/2020. FINDINGS: AP and frog-leg lateral views of the left hip. The mineralization and alignment are normal. There is no evidence of acute fracture or dislocation. No evidence of femoral head avascular necrosis. The hip joint spaces are preserved. Mild degenerative changes at the left sacroiliac joint. Femoral atherosclerosis noted. IMPRESSION: Stable left hip radiographs.  No acute osseous findings. Electronically Signed   By: Carey BullocksWilliam  Veazey M.D.   On: 07/09/2021 17:53   DG HIP UNILAT WITH PELVIS 2-3 VIEWS RIGHT  Result Date: 07/09/2021 CLINICAL DATA:  Fall in bathtub 4 days ago.  Pressure sores. EXAM: DG HIP (WITH OR WITHOUT PELVIS) 2-3V RIGHT COMPARISON:  Radiographs 05/10/2020. FINDINGS: AP pelvis with AP and frog-leg lateral views of the right hip. The mineralization and alignment are normal. There is no evidence of acute fracture or dislocation. No evidence of femoral head avascular necrosis. The hip joint spaces are preserved. Mild sacroiliac degenerative changes are present bilaterally. There is mild lower lumbar spondylosis. A key overlying the lower lumbar spine is presumed to be external to the patient. Femoral atherosclerosis noted. IMPRESSION: No acute osseous findings at the right hip or pelvis. Electronically Signed   By: Carey BullocksWilliam  Veazey M.D.   On: 07/09/2021 17:55    Microbiology: Recent Results (from the past 240 hour(s))  Resp Panel by RT-PCR (Flu A&B, Covid) Nasopharyngeal Swab     Status: None   Collection Time: 07/09/21  3:40 PM   Specimen: Nasopharyngeal Swab; Nasopharyngeal(NP) swabs in vial transport medium  Result Value Ref Range Status   SARS Coronavirus 2 by RT PCR NEGATIVE NEGATIVE Final    Comment: (NOTE) SARS-CoV-2 target nucleic acids are NOT DETECTED.  The SARS-CoV-2 RNA is generally  detectable in upper respiratory specimens during the acute phase of infection. The lowest concentration of SARS-CoV-2 viral copies this assay can detect is 138 copies/mL. A negative result does not preclude SARS-Cov-2 infection and should not be used as the sole basis for treatment or other patient management decisions. A negative result may occur with  improper specimen collection/handling, submission of specimen other than nasopharyngeal swab, presence of viral mutation(s) within the areas targeted by this assay, and inadequate number of viral copies(<138 copies/mL). A  negative result must be combined with clinical observations, patient history, and epidemiological information. The expected result is Negative.  Fact Sheet for Patients:  BloggerCourse.com  Fact Sheet for Healthcare Providers:  SeriousBroker.it  This test is no t yet approved or cleared by the [place] FDA and  has been authorized for detection and/or diagnosis of SARS-CoV-2 by FDA under an Emergency Use Authorization (EUA). This EUA will remain  in effect (meaning this test can be used) for the duration of the COVID-19 declaration under Section 564(b)(1) of the Act, 21 U.S.C.section 360bbb-3(b)(1), unless the authorization is terminated  or revoked sooner.       Influenza A by PCR NEGATIVE NEGATIVE Final   Influenza B by PCR NEGATIVE NEGATIVE Final    Comment: (NOTE) The Xpert Xpress SARS-CoV-2/FLU/RSV plus assay is intended as an aid in the diagnosis of influenza from Nasopharyngeal swab specimens and should not be used as a sole basis for treatment. Nasal washings and aspirates are unacceptable for Xpert Xpress SARS-CoV-2/FLU/RSV testing.  Fact Sheet for Patients: BloggerCourse.com  Fact Sheet for Healthcare Providers: SeriousBroker.it  This test is not yet approved or cleared by the [place] FDA  and has been authorized for detection and/or diagnosis of SARS-CoV-2 by FDA under an Emergency Use Authorization (EUA). This EUA will remain in effect (meaning this test can be used) for the duration of the COVID-19 declaration under Section 564(b)(1) of the Act, 21 U.S.C. section 360bbb-3(b)(1), unless the authorization is terminated or revoked.  Performed at [facility], [address] [place], [place] [address]   MRSA Next Gen by PCR, Nasal     Status: None   Collection Time: 07/11/21  6:31 PM   Specimen: Nasal Mucosa; Nasal Swab  Result Value Ref Range Status   MRSA by PCR Next Gen NOT DETECTED NOT DETECTED Final    Comment: (NOTE) The GeneXpert MRSA Assay (FDA approved for NASAL specimens only), is one component of a comprehensive MRSA colonization surveillance program. It is not intended to diagnose MRSA infection nor to guide or monitor treatment for MRSA infections. Test performance is not FDA approved in patients less than 39 years old. Performed at [facility], [address] [place], [place] [address]   Resp Panel by RT-PCR (Flu A&B, Covid) Nasopharyngeal Swab     Status: None   Collection Time: 07/12/21  9:59 AM   Specimen: Nasopharyngeal Swab; Nasopharyngeal(NP) swabs in vial transport medium  Result Value Ref Range Status   SARS Coronavirus 2 by RT PCR NEGATIVE NEGATIVE Final    Comment: (NOTE) SARS-CoV-2 target nucleic acids are NOT DETECTED.  The SARS-CoV-2 RNA is generally detectable in upper respiratory specimens during the acute phase of infection. The lowest concentration of SARS-CoV-2 viral copies this assay can detect is 138 copies/mL. A negative result does not preclude SARS-Cov-2 infection and should not be used as the sole basis for treatment or other patient management decisions. A negative result may occur with  improper specimen collection/handling, submission of specimen other than nasopharyngeal swab, presence of viral mutation(s)  within the areas targeted by this assay, and inadequate number of viral copies(<138 copies/mL). A negative result must be combined with clinical observations, patient history, and epidemiological information. The expected result is Negative.  Fact Sheet for Patients:  BloggerCourse.com  Fact Sheet for Healthcare Providers:  SeriousBroker.it  This test is no t yet approved or cleared by the [place] FDA and  has been authorized for detection and/or diagnosis of  SARS-CoV-2 by FDA under an Emergency Use Authorization (EUA). This EUA will remain  in effect (meaning this test can be used) for the duration of the COVID-19 declaration under Section 564(b)(1) of the Act, 21 U.S.C.section 360bbb-3(b)(1), unless the authorization is terminated  or revoked sooner.       Influenza A by PCR NEGATIVE NEGATIVE Final   Influenza B by PCR NEGATIVE NEGATIVE Final    Comment: (NOTE) The Xpert Xpress SARS-CoV-2/FLU/RSV plus assay is intended as an aid in the diagnosis of influenza from Nasopharyngeal swab specimens and should not be used as a sole basis for treatment. Nasal washings and aspirates are unacceptable for Xpert Xpress SARS-CoV-2/FLU/RSV testing.  Fact Sheet for Patients: BloggerCourse.com  Fact Sheet for Healthcare Providers: SeriousBroker.it  This test is not yet approved or cleared by the [place] FDA and has been authorized for detection and/or diagnosis of SARS-CoV-2 by FDA under an Emergency Use Authorization (EUA). This EUA will remain in effect (meaning this test can be used) for the duration of the COVID-19 declaration under Section 564(b)(1) of the Act, 21 U.S.C. section 360bbb-3(b)(1), unless the authorization is terminated or revoked.  Performed at [facility], [address], [place], [place] [address]      Labs: Basic Metabolic Panel: Recent Labs   Lab 07/09/21 1354 07/10/21 0212 07/11/21 0157 07/13/21 0231  NA 135 136 137 136  K 3.7 3.8 3.9 4.3  CL 100 102 102 101  CO2 23 28 27 29   GLUCOSE 196* 181* 115* 105*  BUN 20 22 26* 17  CREATININE 0.81 0.76 0.90 0.67  CALCIUM 8.9 8.8* 8.3* 8.4*   Liver Function Tests: Recent Labs  Lab 07/09/21 1354 07/10/21 0212  AST 22 24  ALT 21 19  ALKPHOS 128* 124  BILITOT 2.0* 0.7  PROT 6.3* 5.7*  ALBUMIN 2.4* 2.2*   No results for input(s): LIPASE, AMYLASE in the last 168 hours. No results for input(s): AMMONIA in the last 168 hours. CBC: Recent Labs  Lab 07/09/21 1354 07/10/21 0212 07/11/21 0157  WBC 13.2* 10.2 9.2  NEUTROABS 10.3* 6.8  --   HGB 13.9 13.0 12.4  HCT 42.0 38.6 38.1  MCV 84.8 83.5 85.6  PLT 216 217 189   Cardiac Enzymes: Recent Labs  Lab 07/09/21 1354  CKTOTAL 133   BNP: BNP (last 3 results) Recent Labs    10/30/20 1254 03/14/21 1333  BNP 40.7 116.8*    ProBNP (last 3 results) Recent Labs    02/22/21 1536  PROBNP 55.0    CBG: Recent Labs  Lab 07/11/21 2013 07/12/21 0746 07/12/21 1123 07/12/21 1601 07/13/21 0705  GLUCAP 143* 183* 222* 138* 93       Signed:  09/12/21 MD.  [facility] 07/13/2021, 11:23 AM

## 2021-07-13 NOTE — TOC Transition Note (Signed)
Transition of Care [facility]) - CM/SW Discharge Note   Patient Details  Name: [name] MRN: [id number] Date of Birth: 08-06-1946  Transition of Care [facility]) CM/SW Contact:  Ralene Bathe, LCSWA Phone Number: 07/13/2021, 12:35 PM   Clinical Narrative:    Patient will DC to: [facility] Anticipated DC date:  8/5/52022 Family notified:  Yes Transport by:  Sharin Mons   Per MD patient ready for DC to SNF. RN to call report prior to discharge [phone]. RN, patient, patient's family, and facility notified of DC. Discharge Summary and FL2 sent to facility. DC packet on chart. Ambulance transport will be requested for patient when RN reports that the patient is ready.     CSW will sign off for now as social work intervention is no longer needed. Please consult [place] again if new needs arise.     Final next level of care: Skilled Nursing Facility Barriers to Discharge: Barriers Resolved   Patient Goals and CMS Choice   CMS Medicare.gov Compare Post Acute Care list provided to:: Patient    Discharge Placement              Patient chooses bed at: [facility] Patient to be transferred to facility by: [facility] Name of family member notified: Rico Ala (Relative)   [phone] Patient and family notified of of transfer: 07/13/21  Discharge Plan and Services   Discharge Planning Services: CM Consult                                 Social Determinants of Health (SDOH) Interventions     Readmission Risk Interventions No flowsheet data found.

## 2021-07-13 NOTE — TOC Progression Note (Signed)
Transition of Care [facility]) - Progression Note    Patient Details  Name: [name] MRN: [id number] Date of Birth: [birth year]  Transition of Care [facility]) CM/SW Contact  Ralene Bathe, LCSWA Phone Number: 07/13/2021, 8:45 AM  Clinical Narrative:    08:38-  CSW attempted to contact [employer] to inquire about the insurance authorization being extended and the facility being updated as it was not updated in the portal.  CSW was on hold and then left a message requesting a returned call from a representative.    Pending: insurance auth.     Expected Discharge Plan: Skilled Nursing Facility Barriers to Discharge: Continued Medical Work up  Expected Discharge Plan and Services Expected Discharge Plan: Skilled Nursing Facility   Discharge Planning Services: CM Consult   Living arrangements for the past 2 months: Single Family Home                                       Social Determinants of Health (SDOH) Interventions    Readmission Risk Interventions No flowsheet data found.

## 2021-07-13 NOTE — Progress Notes (Signed)
Patient is in bed resting. Covid booster shot was administered. Patient tolerated well with no complaints. Report was called to SNF and given to [place], Charity fundraiser. [facility] was called by case worker to pickup patient and transport. Discharge paperwork printed and ready for transporter.

## 2021-07-16 DIAGNOSIS — I890 Lymphedema, not elsewhere classified: Secondary | ICD-10-CM | POA: Diagnosis not present

## 2021-07-16 DIAGNOSIS — I872 Venous insufficiency (chronic) (peripheral): Secondary | ICD-10-CM | POA: Diagnosis not present

## 2021-07-16 DIAGNOSIS — L89310 Pressure ulcer of right buttock, unstageable: Secondary | ICD-10-CM | POA: Diagnosis not present

## 2021-07-16 DIAGNOSIS — L98411 Non-pressure chronic ulcer of buttock limited to breakdown of skin: Secondary | ICD-10-CM | POA: Diagnosis not present

## 2021-07-19 DIAGNOSIS — L89150 Pressure ulcer of sacral region, unstageable: Secondary | ICD-10-CM | POA: Diagnosis not present

## 2021-07-19 DIAGNOSIS — E1140 Type 2 diabetes mellitus with diabetic neuropathy, unspecified: Secondary | ICD-10-CM | POA: Diagnosis not present

## 2021-07-19 DIAGNOSIS — I5032 Chronic diastolic (congestive) heart failure: Secondary | ICD-10-CM | POA: Diagnosis not present

## 2021-07-19 DIAGNOSIS — I890 Lymphedema, not elsewhere classified: Secondary | ICD-10-CM | POA: Diagnosis not present

## 2021-07-25 DIAGNOSIS — M6281 Muscle weakness (generalized): Secondary | ICD-10-CM | POA: Diagnosis not present

## 2021-07-25 DIAGNOSIS — L98411 Non-pressure chronic ulcer of buttock limited to breakdown of skin: Secondary | ICD-10-CM | POA: Diagnosis not present

## 2021-07-25 DIAGNOSIS — L89310 Pressure ulcer of right buttock, unstageable: Secondary | ICD-10-CM | POA: Diagnosis not present

## 2021-07-25 DIAGNOSIS — Z9181 History of falling: Secondary | ICD-10-CM | POA: Diagnosis not present

## 2021-07-25 DIAGNOSIS — R600 Localized edema: Secondary | ICD-10-CM | POA: Diagnosis not present

## 2021-07-25 DIAGNOSIS — I872 Venous insufficiency (chronic) (peripheral): Secondary | ICD-10-CM | POA: Diagnosis not present

## 2021-07-25 DIAGNOSIS — I890 Lymphedema, not elsewhere classified: Secondary | ICD-10-CM | POA: Diagnosis not present

## 2021-07-26 ENCOUNTER — Telehealth: Payer: Medicare Other

## 2021-07-26 ENCOUNTER — Telehealth: Payer: Self-pay | Admitting: Pharmacist

## 2021-07-26 DIAGNOSIS — R350 Frequency of micturition: Secondary | ICD-10-CM | POA: Diagnosis not present

## 2021-07-26 DIAGNOSIS — R3915 Urgency of urination: Secondary | ICD-10-CM | POA: Diagnosis not present

## 2021-07-26 DIAGNOSIS — B372 Candidiasis of skin and nail: Secondary | ICD-10-CM | POA: Diagnosis not present

## 2021-07-26 NOTE — Chronic Care Management (AMB) (Signed)
Chronic Care Management Pharmacy Assistant   Name: Sandeep Delagarza  MRN: [id number] DOB: December 02, 1946  Reason for Encounter: Reschedule follow up appointment  Recent office visits:  06/12/21- Dr. Okey Dupre- PCP. Follow up. Increased Rybelsus from 7 mg to 14 mg daily. Increased torsemide to 1-2 tablets daily. Follow up in 3-6 months.   05/04/21- Dr. Okey Dupre- PCP- Video Visit- Bilateral lower extremity cellulitis. Started doxycycline. Nystatin powder and potassium refilled. Follow up PRN.  03/22/21- Dr. Okey Dupre- PCP. Hospital follow up. Increased Rybelsus from 3 mg to 7 mg. Follow up in 2-3 months.   02/22/21- Dr. Okey Dupre- PCP- DM follow up. Increased Lipitor to 80 mg daily to take along with Zetia. Increased lasix to 80 mg to help with swelling and wound. Started Rybelsus 3 mg. Restarted metoprolol 1/2 tablet daily. Follow up in 4 weeks.   Recent consult visits:  06/07/21- Cline Crock, PA-C- Cardiology- Follow up on transcatheter aortic valve replacement. Amoxicillin provided for future dental appointments.   03/14/21- Allen Derry, PA-C- Wound care bilateral calf ulcers. Referred to ED.   03/07/21- Allen Derry, PA-C- Wound care bilateral calf ulcers.  Hospital visits:  Medication Reconciliation was completed by comparing discharge summary, patient's EMR and Pharmacy list, and upon discussion with patient.  Admitted to the hospital on 07/09/21 due to fall at home. Discharge date was 07/13/21. Discharged from [facility].    New?Medications Started at [facility] Discharge:?? -No new medications  Medication Changes at Hospital Discharge: -Changed insulin 70/30 from 45 units twice daily to 30 unit twice daily. Decreased gabapentin from TID to BID.   Medications Discontinued at Hospital Discharge: -No medications discontinued  Medications that remain the same after Hospital Discharge:??  -All other medications will remain the same.    Admitted to the hospital on 03/14/21 due to  cellulitis in foot. Discharge date was 03/18/21. Discharged from [facility].    New?Medications Started at [facility] Discharge:?? -started Doxycycline due to cellulitis, potassium and torsemide.   Medication Changes at Hospital Discharge: -Changed Atorvastatin to 40 mg daily from 80 mg.   Medications Discontinued at Hospital Discharge: -Stopped Furosemide due to switching to torsemide  Medications that remain the same after Hospital Discharge:??  -All other medications will remain the same.    Medications: Outpatient Encounter Medications as of 07/26/2021  Medication Sig Note   acetaminophen (TYLENOL) 325 MG tablet Take 325-650 mg by mouth daily as needed for mild pain or headache.    amoxicillin (AMOXIL) 500 MG tablet Take 4 tablets (2,000 mg total) by mouth as directed. 1 hour prior to dental work including cleanings (Patient taking differently: Take 2,000 mg by mouth See admin instructions. Take 4 tablets (2000 mg) by mouth 1 hour prior to dental work including cleanings)    ASPERCREME LIDOCAINE EX Apply 1 application topically 5 (five) times daily as needed (pain).    aspirin 81 MG tablet Take 81 mg by mouth daily.    atorvastatin (LIPITOR) 40 MG tablet Take 1 tablet (40 mg total) by mouth daily.    citalopram (CELEXA) 20 MG tablet TAKE 1 TABLET BY MOUTH  DAILY (Patient taking differently: Take 20 mg by mouth daily.)    Continuous Blood Gluc Sensor (FREESTYLE LIBRE SENSOR SYSTEM) MISC Use to monitor sugars    esomeprazole (NEXIUM) 20 MG capsule Take 1 capsule (20 mg total) by mouth daily at 12 noon. (Patient taking differently: Take 20 mg by mouth every morning.) 07/09/2021: OTC   ezetimibe (ZETIA) 10 MG tablet Take  1 tablet (10 mg total) by mouth daily.    gabapentin (NEURONTIN) 300 MG capsule Take 1 capsule (300 mg total) by mouth 2 (two) times daily.    glucose blood (ONETOUCH VERIO) test strip USE AS DIRECTED    insulin NPH-regular Human (70-30) 100 UNIT/ML injection Inject  28 Units into the skin 2 (two) times daily with a meal.    Insulin Pen Needle 31G X 8 MM MISC Use to inject insulin four times daily E11.41    metFORMIN (GLUCOPHAGE) 1000 MG tablet TAKE ONE-HALF TABLET BY  MOUTH TWICE DAILY WITH A  MEAL (Patient taking differently: Take 500 mg by mouth 2 (two) times daily.)    metoprolol tartrate (LOPRESSOR) 25 MG tablet Take 0.5 tablets (12.5 mg total) by mouth 2 (two) times daily.    Naphazoline HCl (CLEAR EYES OP) Place 1 drop into both eyes daily as needed (dry eyes/itching).    nystatin (MYCOSTATIN/NYSTOP) powder Apply 1 application topically 3 (three) times daily.    Potassium Chloride ER 20 MEQ TBCR Take 20 mEq by mouth daily with lunch.    Semaglutide (RYBELSUS) 14 MG TABS Take 14 mg by mouth daily.    sodium chloride (OCEAN) 0.65 % SOLN nasal spray Place 1 spray into both nostrils 4 (four) times daily. (Patient taking differently: Place 1 spray into both nostrils 4 (four) times daily as needed for congestion.)    torsemide (DEMADEX) 10 MG tablet Take 2 tablets (20 mg total) by mouth daily.    No facility-administered encounter medications on file as of 07/26/2021.   Rovena Hearld was contacted to reschedule her telephone visit with Al Corpus. New appointment made for 08/27/21 at 10:00. Patient was reminded to have all medications, supplements and any blood glucose and blood pressure readings available for review at appointment.  Are you having any problems with your medications? No  Do you have any concerns you like to discuss with the pharmacist? No   Star Rating Drugs: Medication:  Last Fill: Day Supply Metformin 1000 mg 05/08/21 90 Atorvastatin 40 mg 03/18/21 90 Rybelsus 14 mg 06/12/21  90   Al Corpus, CPP notified  Jomarie Longs, [facility] Clinical Pharmacy Assistant [phone]

## 2021-07-31 DIAGNOSIS — R296 Repeated falls: Secondary | ICD-10-CM | POA: Diagnosis not present

## 2021-07-31 DIAGNOSIS — K219 Gastro-esophageal reflux disease without esophagitis: Secondary | ICD-10-CM | POA: Diagnosis not present

## 2021-07-31 DIAGNOSIS — I10 Essential (primary) hypertension: Secondary | ICD-10-CM | POA: Diagnosis not present

## 2021-07-31 DIAGNOSIS — I872 Venous insufficiency (chronic) (peripheral): Secondary | ICD-10-CM | POA: Diagnosis not present

## 2021-07-31 DIAGNOSIS — E1140 Type 2 diabetes mellitus with diabetic neuropathy, unspecified: Secondary | ICD-10-CM | POA: Diagnosis not present

## 2021-07-31 DIAGNOSIS — M6281 Muscle weakness (generalized): Secondary | ICD-10-CM | POA: Diagnosis not present

## 2021-07-31 DIAGNOSIS — I5032 Chronic diastolic (congestive) heart failure: Secondary | ICD-10-CM | POA: Diagnosis not present

## 2021-08-01 DIAGNOSIS — I872 Venous insufficiency (chronic) (peripheral): Secondary | ICD-10-CM | POA: Diagnosis not present

## 2021-08-01 DIAGNOSIS — I890 Lymphedema, not elsewhere classified: Secondary | ICD-10-CM | POA: Diagnosis not present

## 2021-08-01 DIAGNOSIS — L89310 Pressure ulcer of right buttock, unstageable: Secondary | ICD-10-CM | POA: Diagnosis not present

## 2021-08-01 DIAGNOSIS — L98411 Non-pressure chronic ulcer of buttock limited to breakdown of skin: Secondary | ICD-10-CM | POA: Diagnosis not present

## 2021-08-05 DIAGNOSIS — I890 Lymphedema, not elsewhere classified: Secondary | ICD-10-CM | POA: Diagnosis not present

## 2021-08-05 DIAGNOSIS — E1169 Type 2 diabetes mellitus with other specified complication: Secondary | ICD-10-CM | POA: Diagnosis not present

## 2021-08-05 DIAGNOSIS — I5032 Chronic diastolic (congestive) heart failure: Secondary | ICD-10-CM | POA: Diagnosis not present

## 2021-08-05 DIAGNOSIS — I872 Venous insufficiency (chronic) (peripheral): Secondary | ICD-10-CM | POA: Diagnosis not present

## 2021-08-05 DIAGNOSIS — R296 Repeated falls: Secondary | ICD-10-CM | POA: Diagnosis not present

## 2021-08-05 DIAGNOSIS — L89150 Pressure ulcer of sacral region, unstageable: Secondary | ICD-10-CM | POA: Diagnosis not present

## 2021-08-06 DIAGNOSIS — I5032 Chronic diastolic (congestive) heart failure: Secondary | ICD-10-CM | POA: Diagnosis not present

## 2021-08-06 DIAGNOSIS — L89150 Pressure ulcer of sacral region, unstageable: Secondary | ICD-10-CM | POA: Diagnosis not present

## 2021-08-06 DIAGNOSIS — E1140 Type 2 diabetes mellitus with diabetic neuropathy, unspecified: Secondary | ICD-10-CM | POA: Diagnosis not present

## 2021-08-06 DIAGNOSIS — R296 Repeated falls: Secondary | ICD-10-CM | POA: Diagnosis not present

## 2021-08-06 DIAGNOSIS — I890 Lymphedema, not elsewhere classified: Secondary | ICD-10-CM | POA: Diagnosis not present

## 2021-08-06 DIAGNOSIS — E1151 Type 2 diabetes mellitus with diabetic peripheral angiopathy without gangrene: Secondary | ICD-10-CM | POA: Diagnosis not present

## 2021-08-06 DIAGNOSIS — E1169 Type 2 diabetes mellitus with other specified complication: Secondary | ICD-10-CM | POA: Diagnosis not present

## 2021-08-06 DIAGNOSIS — I872 Venous insufficiency (chronic) (peripheral): Secondary | ICD-10-CM | POA: Diagnosis not present

## 2021-08-07 ENCOUNTER — Telehealth: Payer: Self-pay | Admitting: Internal Medicine

## 2021-08-07 DIAGNOSIS — I5032 Chronic diastolic (congestive) heart failure: Secondary | ICD-10-CM | POA: Diagnosis not present

## 2021-08-07 DIAGNOSIS — I872 Venous insufficiency (chronic) (peripheral): Secondary | ICD-10-CM | POA: Diagnosis not present

## 2021-08-07 DIAGNOSIS — L89150 Pressure ulcer of sacral region, unstageable: Secondary | ICD-10-CM | POA: Diagnosis not present

## 2021-08-07 DIAGNOSIS — I890 Lymphedema, not elsewhere classified: Secondary | ICD-10-CM | POA: Diagnosis not present

## 2021-08-07 DIAGNOSIS — E1151 Type 2 diabetes mellitus with diabetic peripheral angiopathy without gangrene: Secondary | ICD-10-CM | POA: Diagnosis not present

## 2021-08-07 DIAGNOSIS — E1140 Type 2 diabetes mellitus with diabetic neuropathy, unspecified: Secondary | ICD-10-CM | POA: Diagnosis not present

## 2021-08-07 NOTE — Telephone Encounter (Signed)
Unable to get in contact with [name] to give verbal orders. No option to leave a voicemail. I will attempt to contact later.

## 2021-08-07 NOTE — Telephone Encounter (Signed)
Verbal okay? 

## 2021-08-07 NOTE — Telephone Encounter (Signed)
See below

## 2021-08-07 NOTE — Telephone Encounter (Signed)
Verbals are okay. 

## 2021-08-07 NOTE — Telephone Encounter (Signed)
[name] [phone]  Verbal orders needed  Patient referred to [facility], Had been at a skilled facility for a while  Patient has 3 plus pitting edema, small wound that developed while in skilled facility  Will MD give [facility] the okay to start wrapping her legs again, skilled facility daily  Small wound, apply calcium  alginate to wound twice weekly  Unna boots (3 layer compression) for pitting edema bilateral twice weekly

## 2021-08-07 NOTE — Telephone Encounter (Signed)
HH ORDERS   Caller Name: [facility] Agency Name: Deatra James Phone #: [phone] Service Requested: OT Frequency of Visits: 1 week 4

## 2021-08-09 NOTE — Telephone Encounter (Signed)
Verbal orders given to Meredith. 

## 2021-08-09 NOTE — Telephone Encounter (Signed)
Spoke with Michelle to give verbal orders. 

## 2021-08-10 DIAGNOSIS — E1151 Type 2 diabetes mellitus with diabetic peripheral angiopathy without gangrene: Secondary | ICD-10-CM | POA: Diagnosis not present

## 2021-08-10 DIAGNOSIS — I872 Venous insufficiency (chronic) (peripheral): Secondary | ICD-10-CM | POA: Diagnosis not present

## 2021-08-10 DIAGNOSIS — I890 Lymphedema, not elsewhere classified: Secondary | ICD-10-CM | POA: Diagnosis not present

## 2021-08-10 DIAGNOSIS — L89150 Pressure ulcer of sacral region, unstageable: Secondary | ICD-10-CM | POA: Diagnosis not present

## 2021-08-10 DIAGNOSIS — I5032 Chronic diastolic (congestive) heart failure: Secondary | ICD-10-CM | POA: Diagnosis not present

## 2021-08-10 DIAGNOSIS — E1140 Type 2 diabetes mellitus with diabetic neuropathy, unspecified: Secondary | ICD-10-CM | POA: Diagnosis not present

## 2021-08-14 ENCOUNTER — Other Ambulatory Visit: Payer: Self-pay

## 2021-08-14 ENCOUNTER — Telehealth: Payer: Self-pay

## 2021-08-14 ENCOUNTER — Ambulatory Visit (INDEPENDENT_AMBULATORY_CARE_PROVIDER_SITE_OTHER): Payer: Medicare Other | Admitting: Internal Medicine

## 2021-08-14 ENCOUNTER — Encounter: Payer: Self-pay | Admitting: Internal Medicine

## 2021-08-14 VITALS — BP 132/68 | HR 70 | Resp 18 | Ht 64.0 in | Wt 172.4 lb

## 2021-08-14 DIAGNOSIS — IMO0002 Reserved for concepts with insufficient information to code with codable children: Secondary | ICD-10-CM

## 2021-08-14 DIAGNOSIS — R269 Unspecified abnormalities of gait and mobility: Secondary | ICD-10-CM

## 2021-08-14 DIAGNOSIS — Z23 Encounter for immunization: Secondary | ICD-10-CM | POA: Diagnosis not present

## 2021-08-14 DIAGNOSIS — E1165 Type 2 diabetes mellitus with hyperglycemia: Secondary | ICD-10-CM | POA: Diagnosis not present

## 2021-08-14 DIAGNOSIS — E1149 Type 2 diabetes mellitus with other diabetic neurological complication: Secondary | ICD-10-CM

## 2021-08-14 NOTE — Telephone Encounter (Signed)
See below

## 2021-08-14 NOTE — Progress Notes (Signed)
   Subjective:   Patient ID: [name], female    DOB: August 09, 1946, 75 y.o.   MRN: [id number]  HPI The patient is a 75 YO female coming in for follow up after hospital and rehab stay (had a fall in the bathtub and could not get out, was in there 3-4 days until neighbor came to check on her and was able to hear her and call 911, no broken bones but needed IV fluids and PT/OT). She is now doing PT/OT at home and is doing okay. Getting around some. There are wrapping her legs. Appetite okay.  PMH, [facility], social history reviewed and updated  Review of Systems  Constitutional:  Positive for activity change and fatigue.  HENT: Negative.    Eyes: Negative.   Respiratory:  Negative for cough, chest tightness and shortness of breath.   Cardiovascular:  Positive for leg swelling. Negative for chest pain and palpitations.  Gastrointestinal:  Negative for abdominal distention, abdominal pain, constipation, diarrhea, nausea and vomiting.  Musculoskeletal: Negative.   Skin: Negative.   Neurological: Negative.   Psychiatric/Behavioral: Negative.     Objective:  Physical Exam Constitutional:      Appearance: She is well-developed.  HENT:     Head: Normocephalic and atraumatic.  Cardiovascular:     Rate and Rhythm: Normal rate and regular rhythm.  Pulmonary:     Effort: Pulmonary effort is normal. No respiratory distress.     Breath sounds: Normal breath sounds. No wheezing or rales.  Abdominal:     General: Bowel sounds are normal. There is no distension.     Palpations: Abdomen is soft.     Tenderness: There is no abdominal tenderness. There is no rebound.  Musculoskeletal:     Cervical back: Normal range of motion.     Right lower leg: Edema present.     Left lower leg: Edema present.     Comments: Legs wrapped and fluid stable from prior.   Skin:    General: Skin is warm and dry.  Neurological:     Mental Status: She is alert and oriented to person, place, and time.      Coordination: Coordination normal.    Vitals:   08/14/21 1330  BP: 132/68  Pulse: 70  Resp: 18  SpO2: 97%  Weight: 172 lb 6.4 oz (78.2 kg)  Height: 5\' 4"  (1.626 m)    This visit occurred during the SARS-CoV-2 public health emergency.  Safety protocols were in place, including screening questions prior to the visit, additional usage of staff PPE, and extensive cleaning of exam room while observing appropriate contact time as indicated for disinfecting solutions.   Assessment & Plan:  Flu shot given at visit

## 2021-08-14 NOTE — Telephone Encounter (Signed)
Pt was evaluated on 8/30.  Frequency will  be 1 week 1  2 times for 3 weeks 1 time for 4 weeks  To work on gait balancing and strengthening.   Liji [phone]

## 2021-08-14 NOTE — Telephone Encounter (Signed)
Noted  

## 2021-08-14 NOTE — Patient Instructions (Addendum)
We do not need any blood work today.   

## 2021-08-15 DIAGNOSIS — I5032 Chronic diastolic (congestive) heart failure: Secondary | ICD-10-CM | POA: Diagnosis not present

## 2021-08-15 DIAGNOSIS — E1140 Type 2 diabetes mellitus with diabetic neuropathy, unspecified: Secondary | ICD-10-CM | POA: Diagnosis not present

## 2021-08-15 DIAGNOSIS — L89150 Pressure ulcer of sacral region, unstageable: Secondary | ICD-10-CM | POA: Diagnosis not present

## 2021-08-15 DIAGNOSIS — I890 Lymphedema, not elsewhere classified: Secondary | ICD-10-CM | POA: Diagnosis not present

## 2021-08-15 DIAGNOSIS — I872 Venous insufficiency (chronic) (peripheral): Secondary | ICD-10-CM | POA: Diagnosis not present

## 2021-08-15 DIAGNOSIS — E1151 Type 2 diabetes mellitus with diabetic peripheral angiopathy without gangrene: Secondary | ICD-10-CM | POA: Diagnosis not present

## 2021-08-16 DIAGNOSIS — E1140 Type 2 diabetes mellitus with diabetic neuropathy, unspecified: Secondary | ICD-10-CM | POA: Diagnosis not present

## 2021-08-16 DIAGNOSIS — E1151 Type 2 diabetes mellitus with diabetic peripheral angiopathy without gangrene: Secondary | ICD-10-CM | POA: Diagnosis not present

## 2021-08-16 DIAGNOSIS — L89150 Pressure ulcer of sacral region, unstageable: Secondary | ICD-10-CM | POA: Diagnosis not present

## 2021-08-16 DIAGNOSIS — I5032 Chronic diastolic (congestive) heart failure: Secondary | ICD-10-CM | POA: Diagnosis not present

## 2021-08-16 DIAGNOSIS — I872 Venous insufficiency (chronic) (peripheral): Secondary | ICD-10-CM | POA: Diagnosis not present

## 2021-08-16 DIAGNOSIS — I890 Lymphedema, not elsewhere classified: Secondary | ICD-10-CM | POA: Diagnosis not present

## 2021-08-16 NOTE — Telephone Encounter (Signed)
See below

## 2021-08-16 NOTE — Telephone Encounter (Signed)
Liji from [facility] calling to advise if unable to get patient scheduled for today or tomorrow she will start PT next week

## 2021-08-17 ENCOUNTER — Telehealth: Payer: Self-pay | Admitting: Internal Medicine

## 2021-08-17 DIAGNOSIS — E1140 Type 2 diabetes mellitus with diabetic neuropathy, unspecified: Secondary | ICD-10-CM | POA: Diagnosis not present

## 2021-08-17 DIAGNOSIS — E1151 Type 2 diabetes mellitus with diabetic peripheral angiopathy without gangrene: Secondary | ICD-10-CM | POA: Diagnosis not present

## 2021-08-17 DIAGNOSIS — L89150 Pressure ulcer of sacral region, unstageable: Secondary | ICD-10-CM | POA: Diagnosis not present

## 2021-08-17 DIAGNOSIS — I5032 Chronic diastolic (congestive) heart failure: Secondary | ICD-10-CM | POA: Diagnosis not present

## 2021-08-17 DIAGNOSIS — I890 Lymphedema, not elsewhere classified: Secondary | ICD-10-CM | POA: Diagnosis not present

## 2021-08-17 DIAGNOSIS — I872 Venous insufficiency (chronic) (peripheral): Secondary | ICD-10-CM | POA: Diagnosis not present

## 2021-08-17 NOTE — Assessment & Plan Note (Signed)
Medications were not changed in the hospital and she is monitoring diet. She is taking metformin, 70/30 28 units BID and rybelsus 14 mg daily.

## 2021-08-17 NOTE — Telephone Encounter (Signed)
See below

## 2021-08-17 NOTE — Telephone Encounter (Signed)
[name] [phone]  Patient has a stage 1 on left buttocks  Skin prep with foam dressing  Also has ulcer on right buttocks  Has a freestyle libre on left upper arm, but is not checking her blood sugar  MDS form sent over for MD

## 2021-08-17 NOTE — Assessment & Plan Note (Signed)
She is currently doing PT/OT at home and is unsteady. She is at risk for recurrent falls. She does now have life alert system which will ensure she can get help when needed.

## 2021-08-17 NOTE — Telephone Encounter (Signed)
Noted okay to care for wound

## 2021-08-20 DIAGNOSIS — L89150 Pressure ulcer of sacral region, unstageable: Secondary | ICD-10-CM | POA: Diagnosis not present

## 2021-08-20 DIAGNOSIS — I872 Venous insufficiency (chronic) (peripheral): Secondary | ICD-10-CM | POA: Diagnosis not present

## 2021-08-20 DIAGNOSIS — E1140 Type 2 diabetes mellitus with diabetic neuropathy, unspecified: Secondary | ICD-10-CM | POA: Diagnosis not present

## 2021-08-20 DIAGNOSIS — I5032 Chronic diastolic (congestive) heart failure: Secondary | ICD-10-CM | POA: Diagnosis not present

## 2021-08-20 DIAGNOSIS — E1151 Type 2 diabetes mellitus with diabetic peripheral angiopathy without gangrene: Secondary | ICD-10-CM | POA: Diagnosis not present

## 2021-08-20 DIAGNOSIS — I890 Lymphedema, not elsewhere classified: Secondary | ICD-10-CM | POA: Diagnosis not present

## 2021-08-21 DIAGNOSIS — I890 Lymphedema, not elsewhere classified: Secondary | ICD-10-CM | POA: Diagnosis not present

## 2021-08-21 DIAGNOSIS — L89150 Pressure ulcer of sacral region, unstageable: Secondary | ICD-10-CM | POA: Diagnosis not present

## 2021-08-21 DIAGNOSIS — E1151 Type 2 diabetes mellitus with diabetic peripheral angiopathy without gangrene: Secondary | ICD-10-CM | POA: Diagnosis not present

## 2021-08-21 DIAGNOSIS — I5032 Chronic diastolic (congestive) heart failure: Secondary | ICD-10-CM | POA: Diagnosis not present

## 2021-08-21 DIAGNOSIS — I872 Venous insufficiency (chronic) (peripheral): Secondary | ICD-10-CM | POA: Diagnosis not present

## 2021-08-21 DIAGNOSIS — E1140 Type 2 diabetes mellitus with diabetic neuropathy, unspecified: Secondary | ICD-10-CM | POA: Diagnosis not present

## 2021-08-21 NOTE — Telephone Encounter (Signed)
Verbal orders given  

## 2021-08-23 DIAGNOSIS — I872 Venous insufficiency (chronic) (peripheral): Secondary | ICD-10-CM | POA: Diagnosis not present

## 2021-08-23 DIAGNOSIS — E1151 Type 2 diabetes mellitus with diabetic peripheral angiopathy without gangrene: Secondary | ICD-10-CM | POA: Diagnosis not present

## 2021-08-23 DIAGNOSIS — L89150 Pressure ulcer of sacral region, unstageable: Secondary | ICD-10-CM | POA: Diagnosis not present

## 2021-08-23 DIAGNOSIS — E1140 Type 2 diabetes mellitus with diabetic neuropathy, unspecified: Secondary | ICD-10-CM | POA: Diagnosis not present

## 2021-08-23 DIAGNOSIS — I890 Lymphedema, not elsewhere classified: Secondary | ICD-10-CM | POA: Diagnosis not present

## 2021-08-23 DIAGNOSIS — I5032 Chronic diastolic (congestive) heart failure: Secondary | ICD-10-CM | POA: Diagnosis not present

## 2021-08-27 ENCOUNTER — Other Ambulatory Visit: Payer: Self-pay

## 2021-08-27 ENCOUNTER — Ambulatory Visit (INDEPENDENT_AMBULATORY_CARE_PROVIDER_SITE_OTHER): Payer: Medicare Other | Admitting: Pharmacist

## 2021-08-27 DIAGNOSIS — E1169 Type 2 diabetes mellitus with other specified complication: Secondary | ICD-10-CM

## 2021-08-27 DIAGNOSIS — E1140 Type 2 diabetes mellitus with diabetic neuropathy, unspecified: Secondary | ICD-10-CM | POA: Diagnosis not present

## 2021-08-27 DIAGNOSIS — E1149 Type 2 diabetes mellitus with other diabetic neurological complication: Secondary | ICD-10-CM

## 2021-08-27 DIAGNOSIS — E785 Hyperlipidemia, unspecified: Secondary | ICD-10-CM

## 2021-08-27 DIAGNOSIS — I890 Lymphedema, not elsewhere classified: Secondary | ICD-10-CM | POA: Diagnosis not present

## 2021-08-27 DIAGNOSIS — I252 Old myocardial infarction: Secondary | ICD-10-CM

## 2021-08-27 DIAGNOSIS — I872 Venous insufficiency (chronic) (peripheral): Secondary | ICD-10-CM | POA: Diagnosis not present

## 2021-08-27 DIAGNOSIS — E1151 Type 2 diabetes mellitus with diabetic peripheral angiopathy without gangrene: Secondary | ICD-10-CM | POA: Diagnosis not present

## 2021-08-27 DIAGNOSIS — L89150 Pressure ulcer of sacral region, unstageable: Secondary | ICD-10-CM | POA: Diagnosis not present

## 2021-08-27 DIAGNOSIS — IMO0002 Reserved for concepts with insufficient information to code with codable children: Secondary | ICD-10-CM

## 2021-08-27 DIAGNOSIS — I10 Essential (primary) hypertension: Secondary | ICD-10-CM

## 2021-08-27 DIAGNOSIS — I5032 Chronic diastolic (congestive) heart failure: Secondary | ICD-10-CM | POA: Diagnosis not present

## 2021-08-27 MED ORDER — ATORVASTATIN CALCIUM 80 MG PO TABS: 80.0000 mg | ORAL_TABLET | Freq: Every day | ORAL | 0 refills | Status: DC

## 2021-08-27 NOTE — Patient Instructions (Signed)
Visit Information  Phone number for Pharmacist: [phone]   Goals Addressed             This Visit's Progress    COMPLETED: Manage My Medicine       Timeframe:  Long-Range Goal Priority:  High Start Date:     01/19/21                        Expected End Date:       07/19/21                Follow Up Date 03/08/21   - call for medicine refill 2 or 3 days before it runs out - call if I am sick and can't take my medicine - keep a list of all the medicines I take; vitamins and herbals too - use a pillbox to sort medicine  -Stop taking clopidogrel 75 mg (per cardiology stop date was 11/16/2020) -Switch metformin 1000 mg to metformin ER 500 mg twice a day -Switch OTC Nexium to pantoprazole 40 mg daily (free with mail order) -Follow up with [facility] social worker for help with transportation and groceries   Why is this important?   These steps will help you keep on track with your medicines.      Monitor and Manage My Blood Sugar-Diabetes Type 2       Timeframe:  Long-Range Goal Priority:  High Start Date:         01/19/21                    Expected End Date:    08/27/22            Follow Up Date Nov 2022   - check blood sugar at prescribed times - check blood sugar if I feel it is too high or too low - enter blood sugar readings and medication or insulin into daily log - take the blood sugar log to all doctor visits  -Keep [facility] reader in pocket at all times -Call [facility] [phone]) for [facility] sensor refills   Why is this important?   Checking your blood sugar at home helps to keep it from getting very high or very low.  Writing the results in a diary or log helps the doctor know how to care for you.  Your blood sugar log should have the time, date and the results.  Also, write down the amount of insulin or other medicine that you take.  Other information, like what you ate, exercise done and how you were feeling, will also be helpful.        Track and  Manage Fluids and Swelling-Heart Failure       Timeframe:  Long-Range Goal Priority:  High Start Date:     01/19/21                        Expected End Date:    08/27/22             Follow Up Date Nov 2022   - keep legs up while sitting - track weight in diary - use salt in moderation - watch for swelling in feet, ankles and legs every day - weigh myself daily    Why is this important?   It is important to check your weight daily and watch how much salt and liquids you have.  It will help you to manage your heart  failure.         Care Plan : CCM Pharmacy Care Plan  Updates made by Kathyrn Sheriff, RPH since 08/27/2021 12:00 AM     Problem: Hypertension, Hyperlipidemia, Diabetes, Coronary Artery Disease, GERD and Depression, Neuropathy   Priority: High     Long-Range Goal: Disease management   Start Date: 01/19/2021  Expected End Date: 08/27/2022  This Visit's Progress: On track  Recent Progress: Not on track  Priority: High  Note:   Current Barriers:  Unable to independently monitor therapeutic efficacy Suboptimal therapeutic regimen for diabetes Does not adhere to prescribed medication regimen Does not maintain contact with provider office  Pharmacist Clinical Goal(s):  Patient will achieve adherence to monitoring guidelines and medication adherence to achieve therapeutic efficacy adhere to plan to optimize therapeutic regimen for diabetes as evidenced by report of adherence to recommended medication management changes through collaboration with PharmD and provider.   Interventions: 1:1 collaboration with Myrlene Broker, MD regarding development and update of comprehensive plan of care as evidenced by provider attestation and co-signature Inter-disciplinary care team collaboration (see longitudinal plan of care) Comprehensive medication review performed; medication list updated in electronic medical record  Heart Failure / Hypertension (Goal: BP < 140/90,  and prevent exacerbations) -Improving - pt does not endorse swelling problems currently; she is dependent on her walker to get around right now after hospital/rehab stay -Pt is not checking BP at home - monitor is broken -HF Type: Diastolic (grade 2 dysfunction) -Ejection fraction: 60-65% (Date: 06/15/20) -Current treatment Metoprolol tartrate 25 mg - 1/2 tab BID - not on dispense report Torsemide 20 mg BID -Medications previously tried: lisinopril (stopped during hospitalization for AKI, never restarted) -Recommended to continue current medication  Hyperlipidemia: (LDL goal < 70) -Uncontrolled - LDL is above goal; atorvastatin was increased to 80 mg in March but in hospital was reduced back to 40 mg for unknown reasons; pt does not have statin bottle near her to tell me which dose she is taking; statin is overdue based on fill history -Current treatment: Atorvastatin 80 mg daily - unclear if taking or not Ezetimibe 10 mg daily Aspirin 81 mg daily -Medications previously tried: pravastatin  -Educated on Cholesterol goals; Benefits of statin for ASCVD risk reduction; -Refilled atorvastatin 80 mg to mail order pharmacy -Recommend to continue current medication  Diabetes (A1c goal <8%) -Improving - A1c improved from >12% to 9.8% after stating Rybelsus; she endorses compliance with medication and insulin but has previously struggled with adherence; she reports she is wearing Freestyle Libre sensor and reports readings are "good" but unable to provide numbers, she did not have the reader near her during call and is having trouble getting around right now -she reports 1 episode of hypoglycemia recently, she cannot say what her BG was but reports it occurred when she got home later than she had planned and had not brought a snack with her -Current medications: Metformin 1000 mg - 1/2 tab BID Relion Novolin 70/30 28 units twice day ($25/vial at [facility], pt requires 3 vials/month) Rybelsus 14 mg  daily - takes it after eating Freestyle Libre 2 Relion glucometer and test strips -Medications previously tried: Hospital doctor, Novolog -Counseled on hypoglycemia prevention and management -Advised pt to keep [employer] reader near her/on her person at all times. Also provided phone number for [facility] so she can request sensor refills -Recommend to continue current medication  Patient Goals/Self-Care Activities Patient will:  - take medications as prescribed -focus on medication adherence by  pill box -check glucose via Freestyle Libre 2 - keep reader in pocket at all times -Call [facility] to refill Freestyle Libre sensors      Patient verbalizes understanding of instructions provided today and agrees to view in [place].  Telephone follow up appointment with pharmacy team member scheduled for: 2 months  Al Corpus, PharmD, [place], CPP Clinical Pharmacist [facility] at [facility] [phone]

## 2021-08-27 NOTE — Progress Notes (Cosign Needed)
Chronic Care Management Pharmacy Note  08/27/2021 Name:  [name] MRN:  [id number] DOB:  April 04, 1946  Summary: -Pt reports she is wearing CGM and blood sugars are "good" but is not able to provide readings -Pt does not know which dose of atorvastatin she is taking (or if she is taking it all - she cannot get up to look at bottles while on call). Per chart statin was increased to 80 mg in March, decreased back to 40 mg in hospital in April for unclear reasons, also unclear if pt ever received 40 mg dose  Recommendations/Changes made from today's visit: -Advised to keep [employer] reader with her at all times -Advised to call [facility] for Freestyle sensor refills -Refilled atorvastatin 80 mg to mail order pharmacy   Subjective: [name] is an 75 y.o. year old female who is a primary patient of Hoyt Koch, MD.  The CCM team was consulted for assistance with disease management and care coordination needs.    Engaged with patient by telephone for follow up visit in response to provider referral for pharmacy case management and/or care coordination services.   Consent to Services:  The patient was given information about Chronic Care Management services, agreed to services, and gave verbal consent prior to initiation of services.  Please see initial visit note for detailed documentation.   Patient Care Team: Hoyt Koch, MD as PCP - General (Internal Medicine) Skeet Latch, MD as PCP - Cardiology (Cardiology) [place], [place], [place] (Optometry) [place], [place], [facility] as Pharmacist (Pharmacist)  Patient is from [place] but moved away after HS. She moved back to [place] when her sister passed at age 89. Her husband left her around the same time. She does not have any family close by and lives alone. She does not drive, she uses Medicare transportation service to get to doctor's appts however she has not had good experiences with them and has  missed appts because of it.   Recent office visits: 08/14/21 Dr Sharlet Salina OV: hospital/rehab f/u (fell in bathtub x 3-4 days); doing PT; no med changes.   06/12/21- Dr. Sharlet Salina- PCP. Follow up. Increased Rybelsus from 7 mg to 14 mg daily. Increased torsemide to 1-2 tablets daily. Follow up in 3-6 months.   03/22/21- Dr. Sharlet Salina- PCP. Hospital follow up. Increased Rybelsus from 3 mg to 7 mg. Follow up in 2-3 months.    02/22/21- Dr. Sharlet Salina- PCP- DM follow up. Increased Lipitor to 80 mg daily to take along with Zetia. Increased lasix to 80 mg to help with swelling and wound. Started Rybelsus 3 mg. Restarted metoprolol 1/2 tablet daily. Follow up in 4 weeks.   05/04/21- Dr. Sharlet Salina- PCP- Video Visit- Bilateral lower extremity cellulitis. Started doxycycline. Nystatin powder and potassium refilled. Follow up PRN.  05/26/20 Dr Sharlet Salina OV: hospital f/u (NSTEMI w/ stents, s/p TAVR). Metformin contributes to diarrhea. Off ARB due to AKI.  Recent consult visits: 06/07/21- Angelena Form, PA-C- Cardiology- Follow up on transcatheter aortic valve replacement. Amoxicillin provided for future dental appointments.  03/14/21- Jeri Cos, PA-C- Wound care bilateral calf ulcers. Referred to ED.    03/07/21- Jeri Cos, PA-C- Wound care bilateral calf ulcers.  09/25/20 Dr Oval Linsey (cardiology): f/u aortic stenosis s/p TAVR, venous insufficiency. Hx statin intolerance (tried 3). Continue Plavix through 12/9 then continue aspirin alone.   06/15/20 PA Angelena Form (cardiology): s/p TAVR. Worsening LE edema. Increase furosemide to 40 mg daily.  Hospital visits: Medication Reconciliation was completed by comparing discharge  summary, patient's EMR and Pharmacy list, and upon discussion with patient.   Admitted to the hospital on 07/09/21 due to fall at home. Discharge date was 07/13/21. Discharged from [place]?Medications Started at [facility] Discharge:?? -No new medications   Medication  Changes at Hospital Discharge: -Changed insulin 70/30 from 45 units twice daily to 30 unit twice daily. Decreased gabapentin from TID to BID.    Medications Discontinued at Hospital Discharge: -No medications discontinued   Medications that remain the same after Hospital Discharge:??  -All other medications will remain the same.     Admitted to the hospital on 03/14/21 due to cellulitis in foot. Discharge date was 03/18/21. Discharged from [place]?Medications Started at [facility] Discharge:?? -started Doxycycline due to cellulitis, potassium and torsemide.    Medication Changes at Hospital Discharge: -Changed Atorvastatin to 40 mg daily from 80 mg.    Medications Discontinued at Hospital Discharge: -Stopped Furosemide due to switching to torsemide   Medications that remain the same after Hospital Discharge:??  -All other medications will remain the same.    Objective:  Lab Results  Component Value Date   CREATININE 0.67 07/13/2021   BUN 17 07/13/2021   GFR 52.42 (L) 06/12/2021   GFRNONAA >60 07/13/2021   GFRAA 99 06/30/2020   NA 136 07/13/2021   K 4.3 07/13/2021   CALCIUM 8.4 (L) 07/13/2021   CO2 29 07/13/2021    Lab Results  Component Value Date/Time   HGBA1C 9.8 (H) 06/12/2021 02:36 PM   HGBA1C 12.6 (H) 03/14/2021 05:06 PM   HGBA1C 11.2 03/20/2020 11:40 AM   HGBA1C 11.2 (A) 03/20/2020 11:40 AM   HGBA1C 11.2 (A) 03/20/2020 11:40 AM   GFR 52.42 (L) 06/12/2021 02:36 PM   GFR 86.05 02/22/2021 03:36 PM   MICROALBUR 20.4 (H) 03/20/2020 11:57 AM   MICROALBUR 12.1 (H) 05/10/2019 10:50 AM    Last diabetic Eye exam:  Lab Results  Component Value Date/Time   HMDIABEYEEXA Retinopathy (A) 03/16/2020 11:43 AM    Last diabetic Foot exam:  Lab Results  Component Value Date/Time   HMDIABFOOTEX Done 11/09/2010 12:00 AM     Lab Results  Component Value Date   CHOL 180 06/12/2021   HDL 44.60 06/12/2021   LDLCALC 99 06/12/2021   LDLDIRECT 110.0  11/12/2018   TRIG 180.0 (H) 06/12/2021   CHOLHDL 4 06/12/2021    Hepatic Function Latest Ref Rng & Units 07/10/2021 07/09/2021 06/12/2021  Total Protein 6.5 - 8.1 g/dL 5.7(L) 6.3(L) 7.6  Albumin 3.5 - 5.0 g/dL 2.2(L) 2.4(L) 3.8  AST 15 - 41 U/L '24 22 25  ' ALT 0 - 44 U/L 19 21 45(H)  Alk Phosphatase 38 - 126 U/L 124 128(H) 179(H)  Total Bilirubin 0.3 - 1.2 mg/dL 0.7 2.0(H) 0.4    Lab Results  Component Value Date/Time   TSH 4.436 07/09/2021 09:02 PM   TSH 4.000 05/14/2020 04:45 AM   TSH 2.15 08/08/2015 10:56 AM   TSH 1.306 12/19/2014 06:27 PM    CBC Latest Ref Rng & Units 07/11/2021 07/10/2021 07/09/2021  WBC 4.0 - 10.5 K/uL 9.2 10.2 13.2(H)  Hemoglobin 12.0 - 15.0 g/dL 12.4 13.0 13.9  Hematocrit 36.0 - 46.0 % 38.1 38.6 42.0  Platelets 150 - 400 K/uL 189 217 216    No results found for: VD25OH  Clinical ASCVD: Yes  The ASCVD Risk score (Arnett DK, et al., 2019) failed to calculate for the following reasons:  The patient has a prior MI or stroke diagnosis    Depression screen [facility] 2/9 05/23/2020 06/28/2019 06/07/2019  Decreased Interest 0 3 1  Down, Depressed, Hopeless 0 3 1  PHQ - 2 Score 0 6 2  Altered sleeping - 3 3  Tired, decreased energy - 3 3  Change in appetite - 1 3  Feeling bad or failure about yourself  - 2 3  Trouble concentrating - 0 3  Moving slowly or fidgety/restless - 0 0  Suicidal thoughts - 0 0  PHQ-9 Score - 15 17  Some recent data might be hidden     Social History   Tobacco Use  Smoking Status Never  Smokeless Tobacco Never   BP Readings from Last 3 Encounters:  08/14/21 132/68  07/13/21 (!) 115/53  06/12/21 136/68   Pulse Readings from Last 3 Encounters:  08/14/21 70  07/13/21 75  06/12/21 (!) 54   Wt Readings from Last 3 Encounters:  08/14/21 172 lb 6.4 oz (78.2 kg)  07/09/21 188 lb (85.3 kg)  06/12/21 183 lb 12.8 oz (83.4 kg)   BMI Readings from Last 3 Encounters:  08/14/21 29.59 kg/m  07/09/21 32.27 kg/m  06/12/21 31.55 kg/m     Assessment/Interventions: Review of patient past medical history, allergies, medications, health status, including review of consultants reports, laboratory and other test data, was performed as part of comprehensive evaluation and provision of chronic care management services.   SDOH:  (Social Determinants of Health) assessments and interventions performed: Yes   SDOH Screenings   Alcohol Screen: Not on file  Depression (PHQ2-9): Not on file  Financial Resource Strain: Medium Risk   Difficulty of Paying Living Expenses: Somewhat hard  Food Insecurity: Not on file  Housing: Not on file  Physical Activity: Not on file  Social Connections: Not on file  Stress: Not on file  Tobacco Use: Low Risk    Smoking Tobacco Use: Never   Smokeless Tobacco Use: Never  Transportation Needs: Unmet Transportation Needs   Lack of Transportation (Medical): Yes   Lack of Transportation (Non-Medical): Yes   CCM Care Plan  Allergies  Allergen Reactions   Morphine Other (See Comments)    'took me out of this world' I had to be resuscitated   Codeine Sulfate Nausea Only and Other (See Comments)    GI upset and pain   Demerol [Meperidine] Nausea And Vomiting   Propoxyphene Hcl Other (See Comments)    Darvocet caused sick headache    Medications Reviewed Today     Reviewed by Hoyt Koch, MD (Physician) on 08/14/21 at 1348  Med List Status: <None>   Medication Order Taking? Sig Documenting Provider Last Dose Status Informant  acetaminophen (TYLENOL) 325 MG tablet [id number] Yes Take 325-650 mg by mouth daily as needed for mild pain or headache. [provider] Taking Active Multiple Informants  amoxicillin (AMOXIL) 500 MG tablet [id number] Yes Take 4 tablets (2,000 mg total) by mouth as directed. 1 hour prior to dental work including cleanings  Patient taking differently: Take 2,000 mg by mouth See admin instructions. Take 4 tablets (2000 mg) by mouth 1 hour prior to dental  work including cleanings   Crista Luria Taking Active   ASPERCREME LIDOCAINE EX [id number] Yes Apply 1 application topically 5 (five) times daily as needed (pain). [provider] Taking Active Multiple Informants  aspirin 81 MG tablet [id number] Yes Take 81 mg by mouth daily. [provider] Taking Active Multiple Informants  atorvastatin (LIPITOR) 40 MG tablet [id number] Yes Take 1 tablet (40 mg total) by mouth daily. Mercy Riding, MD Taking Active Multiple Informants  citalopram (CELEXA) 20 MG tablet [id number] Yes TAKE 1 TABLET BY MOUTH  DAILY  Patient taking differently: Take 20 mg by mouth daily.   Hoyt Koch, MD Taking Active   collagenase Annitta Needs) ointment [id number] Yes Apply 1 application topically 3 (three) times daily. Apply to sacrum [provider] Taking Active   Continuous Blood Gluc Sensor ([place]) [place] [id number] No Use to monitor sugars  Patient not taking: Reported on 08/14/2021   Hoyt Koch, MD Not Taking Active Multiple Informants  esomeprazole (NEXIUM) 20 MG capsule [id number] Yes Take 1 capsule (20 mg total) by mouth daily at 12 noon.  Patient taking differently: Take 20 mg by mouth every morning.   Hoyt Koch, MD Taking Active            Med Note Rosine Beat Jul 09, 2021  8:34 PM) OTC  ezetimibe (ZETIA) 10 MG tablet [id number] Yes Take 1 tablet (10 mg total) by mouth daily. Hoyt Koch, MD Taking Active Multiple Informants  gabapentin (NEURONTIN) 300 MG capsule [id number] Yes Take 1 capsule (300 mg total) by mouth 2 (two) times daily. Domenic Polite, MD Taking Active   glucose blood [facility] VERIO) test strip [id number] Yes USE AS DIRECTED Hoyt Koch, MD Taking Active Multiple Informants  insulin NPH-regular Human (70-30) 100 UNIT/ML injection [id number] Yes Inject 28 Units into the skin 2 (two) times daily with a meal. Domenic Polite, MD Taking Active    Insulin Pen Needle 31G X 8 MM MISC [id number] Yes Use to inject insulin four times daily E11.41 Hoyt Koch, MD Taking Active Multiple Informants  metFORMIN (GLUCOPHAGE) 1000 MG tablet [id number] Yes TAKE ONE-HALF TABLET BY  MOUTH TWICE DAILY WITH A  MEAL  Patient taking differently: Take 500 mg by mouth 2 (two) times daily.   Hoyt Koch, MD Taking Active   metoprolol tartrate (LOPRESSOR) 25 MG tablet [id number] Yes Take 0.5 tablets (12.5 mg total) by mouth 2 (two) times daily. Hoyt Koch, MD Taking Active Multiple Informants  Naphazoline HCl (CLEAR EYES OP) [id number] Yes Place 1 drop into both eyes daily as needed (dry eyes/itching). [provider] Taking Active Multiple Informants  nystatin (MYCOSTATIN/NYSTOP) powder [id number] Yes Apply 1 application topically 3 (three) times daily. Hoyt Koch, MD Taking Active Multiple Informants  Potassium Chloride ER 20 MEQ TBCR [id number]  Take 20 mEq by mouth daily with lunch. Domenic Polite, MD  Expired 08/12/21 2359   Semaglutide (RYBELSUS) 14 MG TABS [id number] Yes Take 14 mg by mouth daily. Hoyt Koch, MD Taking Active Multiple Informants  sodium chloride (OCEAN) 0.65 % SOLN nasal spray [id number] Yes Place 1 spray into both nostrils 4 (four) times daily.  Patient taking differently: Place 1 spray into both nostrils 4 (four) times daily as needed for congestion.   Hoyt Koch, MD Taking Active   torsemide [facility]) 10 MG tablet [id number] Yes Take 2 tablets (20 mg total) by mouth daily. Domenic Polite, MD Taking Active             Patient Active Problem List   Diagnosis Date Noted   Fall at home 07/09/2021   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   Near syncope 05/11/2020   Pressure injury of skin 05/11/2020   Orthostatic hypotension 05/11/2020   Dental  caries 05/11/2020   Aortic stenosis    Diabetic neuropathy (HCC) 11/13/2018   Left knee pain  10/24/2017   Greater trochanteric bursitis of left hip 10/24/2017   Atypical chest pain 10/10/2015   Reactive airway disease 04/09/2014   Seasonal allergic rhinitis 04/09/2014   Preventative health care 04/09/2014   Abnormality of gait 03/17/2013   Irritable bowel syndrome (IBS) 08/27/2012   Shoulder pain 09/25/2009   Type II diabetes mellitus with neurological manifestations, uncontrolled ([place]) 06/18/2007   GERD 06/18/2007   Hyperlipidemia associated with type 2 diabetes mellitus ([place]) 12/27/2006   Essential hypertension 09/19/2006   Venous (peripheral) insufficiency 09/19/2006    Immunization History  Administered Date(s) Administered   Fluad Quad(high Dose 65+) 08/30/2019, 09/09/2020, 08/14/2021   Influenza Split 08/27/2012   Influenza Whole 09/27/2005, 09/18/2007, 08/29/2009, 11/09/2010   Influenza, High Dose Seasonal PF 10/21/2017, 11/12/2018   Influenza,inj,Quad PF,6+ Mos 10/22/2013, 12/20/2014, 10/09/2015, 07/29/2016   Moderna SARS-COV2 Booster Vaccination 07/13/2021   PFIZER(Purple Top)SARS-COV-2 Vaccination 02/03/2020, 02/29/2020   Pneumococcal Conjugate-13 07/23/2016   Pneumococcal Polysaccharide-23 02/09/2013, 12/20/2014   Tdap 08/27/2012   Zoster, Live 02/19/2013    Conditions to be addressed/monitored:  Hypertension, Hyperlipidemia, Diabetes, Coronary Artery Disease, GERD and Depression, Neuropathy  Care Plan : [place]  Updates made by Charlton Haws, [place] since 08/27/2021 12:00 AM     Problem: Hypertension, Hyperlipidemia, Diabetes, Coronary Artery Disease, GERD and Depression, Neuropathy   Priority: High     Long-Range Goal: Disease management   Start Date: 01/19/2021  Expected End Date: 08/27/2022  This Visit's Progress: On track  Recent Progress: Not on track  Priority: High  Note:   Current Barriers:  Unable to independently monitor therapeutic efficacy Suboptimal therapeutic regimen for diabetes Does not adhere to prescribed  medication regimen Does not maintain contact with provider office  Pharmacist Clinical Goal(s):  Patient will achieve adherence to monitoring guidelines and medication adherence to achieve therapeutic efficacy adhere to plan to optimize therapeutic regimen for diabetes as evidenced by report of adherence to recommended medication management changes through collaboration with PharmD and provider.   Interventions: 1:1 collaboration with Hoyt Koch, MD regarding development and update of comprehensive plan of care as evidenced by provider attestation and co-signature Inter-disciplinary care team collaboration (see longitudinal plan of care) Comprehensive medication review performed; medication list updated in electronic medical record  Heart Failure / Hypertension (Goal: BP < 140/90, and prevent exacerbations) -Improving - pt does not endorse swelling problems currently; she is dependent on her walker to get around right now after hospital/rehab stay -Pt is not checking BP at home - monitor is broken -HF Type: Diastolic (grade 2 dysfunction) -Ejection fraction: 60-65% (Date: 06/15/20) -Current treatment Metoprolol tartrate 25 mg - 1/2 tab BID - not on dispense report Torsemide 20 mg BID -Medications previously tried: lisinopril (stopped during hospitalization for AKI, never restarted) -Recommended to continue current medication  Hyperlipidemia: (LDL goal < 70) -Uncontrolled - LDL is above goal; atorvastatin was increased to 80 mg in March but in hospital was reduced back to 40 mg for unknown reasons; pt does not have statin bottle near her to tell me which dose she is taking; statin is overdue based on fill history -Current treatment: Atorvastatin 80 mg daily - unclear if taking or not Ezetimibe 10 mg daily Aspirin 81 mg daily -Medications previously tried: pravastatin  -Educated on Cholesterol goals; Benefits of statin for ASCVD risk reduction; -Refilled atorvastatin 80 mg to  mail order pharmacy -Recommend to  continue current medication  Diabetes (A1c goal <8%) -Improving - A1c improved from >12% to 9.8% after stating Rybelsus; she endorses compliance with medication and insulin but has previously struggled with adherence; she reports she is wearing Freestyle Libre sensor and reports readings are "good" but unable to provide numbers, she did not have the reader near her during call and is having trouble getting around right now -she reports 1 episode of hypoglycemia recently, she cannot say what her BG was but reports it occurred when she got home later than she had planned and had not brought a snack with her -Current medications: Metformin 1000 mg - 1/2 tab BID Relion Novolin 70/30 28 units twice day ($25/vial at [facility], pt requires 3 vials/month) Rybelsus 14 mg daily - takes it after eating Freestyle Libre 2 Relion glucometer and test strips -Medications previously tried: Engineer, agricultural, [place] on hypoglycemia prevention and management -Advised pt to keep [employer] reader near her/on her person at all times. Also provided phone number for [facility] so she can request sensor refills -Recommend to continue current medication  Patient Goals/Self-Care Activities Patient will:  - take medications as prescribed -focus on medication adherence by pill box -check glucose via Freestyle Libre 2 - keep reader in pocket at all times -Call [facility] to refill Freestyle Libre sensors        Compliance/Adherence/Medication fill history: Care Gaps: Foot exam - 03/20/21 Eye exam - 03/16/21 Urine microalbumin - 03/20/21  Star-Rating Drugs: Atorvastatin - LF 03/18/21 x 90 ds Rybelsus - LF 06/12/21 x 90 ds Metformin - LF 08/01/21 x 90 ds   Medication Assistance: None required.  Patient affirms current coverage meets needs.  Patient's preferred pharmacy is:  [facility] [place], [place] [address] Phone:  [phone] Fax: [phone]  [facility], [address] [place] [address] Phone: [phone] Fax: [phone]  [facility] ([facility]) - [place], [place] [address] Phone: [phone] Fax: [phone]  Uses pill box? Yes - monthly box Pt endorses 100% compliance  We discussed: Current pharmacy is preferred with insurance plan and patient is satisfied with pharmacy services Patient decided to: Continue current medication management strategy  Care Plan and Follow Up Patient Decision:  Patient agrees to Care Plan and Follow-up.  Plan: Telephone follow up appointment with care management team member scheduled for:  2 month  Charlene Brooke, PharmD, [place], CPP Clinical Pharmacist [facility] [phone]

## 2021-08-28 DIAGNOSIS — E1140 Type 2 diabetes mellitus with diabetic neuropathy, unspecified: Secondary | ICD-10-CM | POA: Diagnosis not present

## 2021-08-28 DIAGNOSIS — I872 Venous insufficiency (chronic) (peripheral): Secondary | ICD-10-CM | POA: Diagnosis not present

## 2021-08-28 DIAGNOSIS — E1151 Type 2 diabetes mellitus with diabetic peripheral angiopathy without gangrene: Secondary | ICD-10-CM | POA: Diagnosis not present

## 2021-08-28 DIAGNOSIS — L89150 Pressure ulcer of sacral region, unstageable: Secondary | ICD-10-CM | POA: Diagnosis not present

## 2021-08-28 DIAGNOSIS — I5032 Chronic diastolic (congestive) heart failure: Secondary | ICD-10-CM | POA: Diagnosis not present

## 2021-08-28 DIAGNOSIS — I890 Lymphedema, not elsewhere classified: Secondary | ICD-10-CM | POA: Diagnosis not present

## 2021-08-30 DIAGNOSIS — I890 Lymphedema, not elsewhere classified: Secondary | ICD-10-CM | POA: Diagnosis not present

## 2021-08-30 DIAGNOSIS — E1140 Type 2 diabetes mellitus with diabetic neuropathy, unspecified: Secondary | ICD-10-CM | POA: Diagnosis not present

## 2021-08-30 DIAGNOSIS — L89150 Pressure ulcer of sacral region, unstageable: Secondary | ICD-10-CM | POA: Diagnosis not present

## 2021-08-30 DIAGNOSIS — I872 Venous insufficiency (chronic) (peripheral): Secondary | ICD-10-CM | POA: Diagnosis not present

## 2021-08-30 DIAGNOSIS — I5032 Chronic diastolic (congestive) heart failure: Secondary | ICD-10-CM | POA: Diagnosis not present

## 2021-08-30 DIAGNOSIS — E1151 Type 2 diabetes mellitus with diabetic peripheral angiopathy without gangrene: Secondary | ICD-10-CM | POA: Diagnosis not present

## 2021-08-31 ENCOUNTER — Telehealth: Payer: Self-pay

## 2021-08-31 DIAGNOSIS — I872 Venous insufficiency (chronic) (peripheral): Secondary | ICD-10-CM | POA: Diagnosis not present

## 2021-08-31 DIAGNOSIS — E1140 Type 2 diabetes mellitus with diabetic neuropathy, unspecified: Secondary | ICD-10-CM | POA: Diagnosis not present

## 2021-08-31 DIAGNOSIS — E1151 Type 2 diabetes mellitus with diabetic peripheral angiopathy without gangrene: Secondary | ICD-10-CM | POA: Diagnosis not present

## 2021-08-31 DIAGNOSIS — I890 Lymphedema, not elsewhere classified: Secondary | ICD-10-CM | POA: Diagnosis not present

## 2021-08-31 DIAGNOSIS — I5032 Chronic diastolic (congestive) heart failure: Secondary | ICD-10-CM | POA: Diagnosis not present

## 2021-08-31 DIAGNOSIS — L89150 Pressure ulcer of sacral region, unstageable: Secondary | ICD-10-CM | POA: Diagnosis not present

## 2021-08-31 NOTE — Telephone Encounter (Signed)
Ok to take medication, please make ROV next available, or go to UC or ED with worsening swelling, pain, sob, fever, falls or other unusual symptoms

## 2021-08-31 NOTE — Telephone Encounter (Signed)
Called patient. LVM with instructions from Dr. Jonny Ruiz. Office number was provided.

## 2021-08-31 NOTE — Telephone Encounter (Signed)
Weight was 179lb missing lasix doses Anissa with [facility] feel it has been multiple doses missed legs a avery swollen.   Last weight last week was 173lb.  [phone]

## 2021-09-03 DIAGNOSIS — L89150 Pressure ulcer of sacral region, unstageable: Secondary | ICD-10-CM | POA: Diagnosis not present

## 2021-09-03 DIAGNOSIS — E1140 Type 2 diabetes mellitus with diabetic neuropathy, unspecified: Secondary | ICD-10-CM | POA: Diagnosis not present

## 2021-09-03 DIAGNOSIS — I872 Venous insufficiency (chronic) (peripheral): Secondary | ICD-10-CM | POA: Diagnosis not present

## 2021-09-03 DIAGNOSIS — I5032 Chronic diastolic (congestive) heart failure: Secondary | ICD-10-CM | POA: Diagnosis not present

## 2021-09-03 DIAGNOSIS — E1165 Type 2 diabetes mellitus with hyperglycemia: Secondary | ICD-10-CM | POA: Diagnosis not present

## 2021-09-03 DIAGNOSIS — E1151 Type 2 diabetes mellitus with diabetic peripheral angiopathy without gangrene: Secondary | ICD-10-CM | POA: Diagnosis not present

## 2021-09-03 DIAGNOSIS — I890 Lymphedema, not elsewhere classified: Secondary | ICD-10-CM | POA: Diagnosis not present

## 2021-09-03 DIAGNOSIS — Z794 Long term (current) use of insulin: Secondary | ICD-10-CM | POA: Diagnosis not present

## 2021-09-04 DIAGNOSIS — I5032 Chronic diastolic (congestive) heart failure: Secondary | ICD-10-CM | POA: Diagnosis not present

## 2021-09-04 DIAGNOSIS — I890 Lymphedema, not elsewhere classified: Secondary | ICD-10-CM | POA: Diagnosis not present

## 2021-09-04 DIAGNOSIS — E1151 Type 2 diabetes mellitus with diabetic peripheral angiopathy without gangrene: Secondary | ICD-10-CM | POA: Diagnosis not present

## 2021-09-04 DIAGNOSIS — E1140 Type 2 diabetes mellitus with diabetic neuropathy, unspecified: Secondary | ICD-10-CM | POA: Diagnosis not present

## 2021-09-04 DIAGNOSIS — I872 Venous insufficiency (chronic) (peripheral): Secondary | ICD-10-CM | POA: Diagnosis not present

## 2021-09-04 DIAGNOSIS — L89150 Pressure ulcer of sacral region, unstageable: Secondary | ICD-10-CM | POA: Diagnosis not present

## 2021-09-06 DIAGNOSIS — I872 Venous insufficiency (chronic) (peripheral): Secondary | ICD-10-CM | POA: Diagnosis not present

## 2021-09-06 DIAGNOSIS — E1140 Type 2 diabetes mellitus with diabetic neuropathy, unspecified: Secondary | ICD-10-CM | POA: Diagnosis not present

## 2021-09-06 DIAGNOSIS — L89150 Pressure ulcer of sacral region, unstageable: Secondary | ICD-10-CM | POA: Diagnosis not present

## 2021-09-06 DIAGNOSIS — I890 Lymphedema, not elsewhere classified: Secondary | ICD-10-CM | POA: Diagnosis not present

## 2021-09-06 DIAGNOSIS — I5032 Chronic diastolic (congestive) heart failure: Secondary | ICD-10-CM | POA: Diagnosis not present

## 2021-09-06 DIAGNOSIS — E1151 Type 2 diabetes mellitus with diabetic peripheral angiopathy without gangrene: Secondary | ICD-10-CM | POA: Diagnosis not present

## 2021-09-07 DIAGNOSIS — E1169 Type 2 diabetes mellitus with other specified complication: Secondary | ICD-10-CM | POA: Diagnosis not present

## 2021-09-07 DIAGNOSIS — E1165 Type 2 diabetes mellitus with hyperglycemia: Secondary | ICD-10-CM

## 2021-09-07 DIAGNOSIS — E1149 Type 2 diabetes mellitus with other diabetic neurological complication: Secondary | ICD-10-CM | POA: Diagnosis not present

## 2021-09-07 DIAGNOSIS — I252 Old myocardial infarction: Secondary | ICD-10-CM

## 2021-09-07 DIAGNOSIS — I10 Essential (primary) hypertension: Secondary | ICD-10-CM

## 2021-09-07 DIAGNOSIS — E785 Hyperlipidemia, unspecified: Secondary | ICD-10-CM

## 2021-09-11 ENCOUNTER — Ambulatory Visit: Payer: Medicare Other | Admitting: Internal Medicine

## 2021-09-11 DIAGNOSIS — L89150 Pressure ulcer of sacral region, unstageable: Secondary | ICD-10-CM | POA: Diagnosis not present

## 2021-09-11 DIAGNOSIS — I890 Lymphedema, not elsewhere classified: Secondary | ICD-10-CM | POA: Diagnosis not present

## 2021-09-11 DIAGNOSIS — I872 Venous insufficiency (chronic) (peripheral): Secondary | ICD-10-CM | POA: Diagnosis not present

## 2021-09-11 DIAGNOSIS — I5032 Chronic diastolic (congestive) heart failure: Secondary | ICD-10-CM | POA: Diagnosis not present

## 2021-09-11 DIAGNOSIS — E1140 Type 2 diabetes mellitus with diabetic neuropathy, unspecified: Secondary | ICD-10-CM | POA: Diagnosis not present

## 2021-09-11 DIAGNOSIS — E1151 Type 2 diabetes mellitus with diabetic peripheral angiopathy without gangrene: Secondary | ICD-10-CM | POA: Diagnosis not present

## 2021-09-12 DIAGNOSIS — I5032 Chronic diastolic (congestive) heart failure: Secondary | ICD-10-CM | POA: Diagnosis not present

## 2021-09-12 DIAGNOSIS — L89150 Pressure ulcer of sacral region, unstageable: Secondary | ICD-10-CM | POA: Diagnosis not present

## 2021-09-12 DIAGNOSIS — I890 Lymphedema, not elsewhere classified: Secondary | ICD-10-CM | POA: Diagnosis not present

## 2021-09-12 DIAGNOSIS — E1151 Type 2 diabetes mellitus with diabetic peripheral angiopathy without gangrene: Secondary | ICD-10-CM | POA: Diagnosis not present

## 2021-09-12 DIAGNOSIS — I872 Venous insufficiency (chronic) (peripheral): Secondary | ICD-10-CM | POA: Diagnosis not present

## 2021-09-12 DIAGNOSIS — E1140 Type 2 diabetes mellitus with diabetic neuropathy, unspecified: Secondary | ICD-10-CM | POA: Diagnosis not present

## 2021-09-13 DIAGNOSIS — I890 Lymphedema, not elsewhere classified: Secondary | ICD-10-CM | POA: Diagnosis not present

## 2021-09-13 DIAGNOSIS — L89150 Pressure ulcer of sacral region, unstageable: Secondary | ICD-10-CM | POA: Diagnosis not present

## 2021-09-13 DIAGNOSIS — I872 Venous insufficiency (chronic) (peripheral): Secondary | ICD-10-CM | POA: Diagnosis not present

## 2021-09-13 DIAGNOSIS — E1140 Type 2 diabetes mellitus with diabetic neuropathy, unspecified: Secondary | ICD-10-CM | POA: Diagnosis not present

## 2021-09-13 DIAGNOSIS — I5032 Chronic diastolic (congestive) heart failure: Secondary | ICD-10-CM | POA: Diagnosis not present

## 2021-09-13 DIAGNOSIS — E1151 Type 2 diabetes mellitus with diabetic peripheral angiopathy without gangrene: Secondary | ICD-10-CM | POA: Diagnosis not present

## 2021-09-13 NOTE — Telephone Encounter (Signed)
   Anissa from [facility] requesting to discontinue order for leg wraps. Patient does not want them wrapped. She also states patient may not be taking fluid pill as directed, legs are extremely swollen

## 2021-09-13 NOTE — Telephone Encounter (Signed)
See below

## 2021-09-14 NOTE — Telephone Encounter (Signed)
Okay to discontinue

## 2021-09-14 NOTE — Telephone Encounter (Signed)
Spoke with Anissa to give verbal orders to discontinue. No questions or concerns at this time.

## 2021-09-18 DIAGNOSIS — E1140 Type 2 diabetes mellitus with diabetic neuropathy, unspecified: Secondary | ICD-10-CM | POA: Diagnosis not present

## 2021-09-18 DIAGNOSIS — I872 Venous insufficiency (chronic) (peripheral): Secondary | ICD-10-CM | POA: Diagnosis not present

## 2021-09-18 DIAGNOSIS — I5032 Chronic diastolic (congestive) heart failure: Secondary | ICD-10-CM | POA: Diagnosis not present

## 2021-09-18 DIAGNOSIS — E1151 Type 2 diabetes mellitus with diabetic peripheral angiopathy without gangrene: Secondary | ICD-10-CM | POA: Diagnosis not present

## 2021-09-18 DIAGNOSIS — I890 Lymphedema, not elsewhere classified: Secondary | ICD-10-CM | POA: Diagnosis not present

## 2021-09-18 DIAGNOSIS — L89150 Pressure ulcer of sacral region, unstageable: Secondary | ICD-10-CM | POA: Diagnosis not present

## 2021-09-20 DIAGNOSIS — I872 Venous insufficiency (chronic) (peripheral): Secondary | ICD-10-CM | POA: Diagnosis not present

## 2021-09-20 DIAGNOSIS — I890 Lymphedema, not elsewhere classified: Secondary | ICD-10-CM | POA: Diagnosis not present

## 2021-09-20 DIAGNOSIS — L89150 Pressure ulcer of sacral region, unstageable: Secondary | ICD-10-CM | POA: Diagnosis not present

## 2021-09-20 DIAGNOSIS — I5032 Chronic diastolic (congestive) heart failure: Secondary | ICD-10-CM | POA: Diagnosis not present

## 2021-09-20 DIAGNOSIS — E1140 Type 2 diabetes mellitus with diabetic neuropathy, unspecified: Secondary | ICD-10-CM | POA: Diagnosis not present

## 2021-09-20 DIAGNOSIS — E1151 Type 2 diabetes mellitus with diabetic peripheral angiopathy without gangrene: Secondary | ICD-10-CM | POA: Diagnosis not present

## 2021-09-25 ENCOUNTER — Telehealth: Payer: Self-pay | Admitting: Internal Medicine

## 2021-09-25 DIAGNOSIS — E1140 Type 2 diabetes mellitus with diabetic neuropathy, unspecified: Secondary | ICD-10-CM | POA: Diagnosis not present

## 2021-09-25 DIAGNOSIS — L89150 Pressure ulcer of sacral region, unstageable: Secondary | ICD-10-CM | POA: Diagnosis not present

## 2021-09-25 DIAGNOSIS — I872 Venous insufficiency (chronic) (peripheral): Secondary | ICD-10-CM | POA: Diagnosis not present

## 2021-09-25 DIAGNOSIS — I890 Lymphedema, not elsewhere classified: Secondary | ICD-10-CM | POA: Diagnosis not present

## 2021-09-25 DIAGNOSIS — I5032 Chronic diastolic (congestive) heart failure: Secondary | ICD-10-CM | POA: Diagnosis not present

## 2021-09-25 DIAGNOSIS — E1151 Type 2 diabetes mellitus with diabetic peripheral angiopathy without gangrene: Secondary | ICD-10-CM | POA: Diagnosis not present

## 2021-09-25 NOTE — Telephone Encounter (Signed)
[name] from [facility] called   Patient is having burning, dark colored urine and pain in vagina. She suspects a UTI. Requesting verbals for a culture and possible an antibiotic.   Best contact #: [phone]

## 2021-09-25 NOTE — Telephone Encounter (Signed)
See below

## 2021-09-26 DIAGNOSIS — I872 Venous insufficiency (chronic) (peripheral): Secondary | ICD-10-CM | POA: Diagnosis not present

## 2021-09-26 DIAGNOSIS — N399 Disorder of urinary system, unspecified: Secondary | ICD-10-CM | POA: Diagnosis not present

## 2021-09-26 DIAGNOSIS — I5032 Chronic diastolic (congestive) heart failure: Secondary | ICD-10-CM | POA: Diagnosis not present

## 2021-09-26 DIAGNOSIS — I890 Lymphedema, not elsewhere classified: Secondary | ICD-10-CM | POA: Diagnosis not present

## 2021-09-26 DIAGNOSIS — E1140 Type 2 diabetes mellitus with diabetic neuropathy, unspecified: Secondary | ICD-10-CM | POA: Diagnosis not present

## 2021-09-26 DIAGNOSIS — E1151 Type 2 diabetes mellitus with diabetic peripheral angiopathy without gangrene: Secondary | ICD-10-CM | POA: Diagnosis not present

## 2021-09-26 DIAGNOSIS — L89150 Pressure ulcer of sacral region, unstageable: Secondary | ICD-10-CM | POA: Diagnosis not present

## 2021-09-26 MED ORDER — NITROFURANTOIN MONOHYD MACRO 100 MG PO CAPS: 100.0000 mg | ORAL_CAPSULE | Freq: Two times a day (BID) | ORAL | 0 refills | Status: DC

## 2021-09-26 NOTE — Telephone Encounter (Signed)
Verbals okay for U/A and culture results to be faxed to [place]. Rx for macrobid to be started once culture is obtained or before if this will be delayed more than 24 hours.

## 2021-09-26 NOTE — Telephone Encounter (Signed)
Verbal orders given to Christy. °

## 2021-09-26 NOTE — Telephone Encounter (Signed)
Anissa with [facility] has stated the pt was only able to give enough urine for Urine Cx not a UA. Anissa states pt urine was pure blood.  Anissa has also stated that the pt has a 3lb weight gain since Monday as she weighed 281lbs on Monday and now weighs 284lbs. She has also stated the pt has informed her she is not taking her torsemide (DEMADEX) 10 MG tablet.   You can reach Anissa at [phone] with any further questions.

## 2021-09-26 NOTE — Telephone Encounter (Signed)
See below

## 2021-09-27 ENCOUNTER — Telehealth: Payer: Self-pay

## 2021-09-27 NOTE — Telephone Encounter (Signed)
Called patient, phone went straight to voicemail. LVM asking the patient to call our office to discuss to her torsemide and possibly scheduling a visit. Office number was provided.    If patient calls back please a schedule a virtual or in office visit with Dr. Okey Dupre.

## 2021-09-27 NOTE — Telephone Encounter (Signed)
I would definitely recommend to resume torsemide and offer visit to patient in person or virtual to assess.

## 2021-09-27 NOTE — Chronic Care Management (AMB) (Signed)
Chronic Care Management Pharmacy Assistant   Name: Davon Folta  MRN: [id number] DOB: 06/23/1946    Reason for Encounter: Disease State   Conditions to be addressed/monitored: DMII   Recent office visits:  None ID  Recent consult visits:  None ID  Hospital visits:  None since last coordination call  Medications: Outpatient Encounter Medications as of 09/27/2021  Medication Sig Note   acetaminophen (TYLENOL) 325 MG tablet Take 325-650 mg by mouth daily as needed for mild pain or headache.    amoxicillin (AMOXIL) 500 MG tablet Take 4 tablets (2,000 mg total) by mouth as directed. 1 hour prior to dental work including cleanings (Patient taking differently: Take 2,000 mg by mouth See admin instructions. Take 4 tablets (2000 mg) by mouth 1 hour prior to dental work including cleanings)    ASPERCREME LIDOCAINE EX Apply 1 application topically 5 (five) times daily as needed (pain).    aspirin 81 MG tablet Take 81 mg by mouth daily.    atorvastatin (LIPITOR) 80 MG tablet Take 1 tablet (80 mg total) by mouth daily.    citalopram (CELEXA) 20 MG tablet TAKE 1 TABLET BY MOUTH  DAILY (Patient taking differently: Take 20 mg by mouth daily.)    collagenase (SANTYL) ointment Apply 1 application topically 3 (three) times daily. Apply to sacrum    Continuous Blood Gluc Sensor (FREESTYLE LIBRE SENSOR SYSTEM) MISC Use to monitor sugars (Patient not taking: Reported on 08/14/2021)    esomeprazole (NEXIUM) 20 MG capsule Take 1 capsule (20 mg total) by mouth daily at 12 noon. (Patient taking differently: Take 20 mg by mouth every morning.) 07/09/2021: OTC   ezetimibe (ZETIA) 10 MG tablet Take 1 tablet (10 mg total) by mouth daily.    gabapentin (NEURONTIN) 300 MG capsule Take 1 capsule (300 mg total) by mouth 2 (two) times daily.    glucose blood (ONETOUCH VERIO) test strip USE AS DIRECTED    insulin NPH-regular Human (70-30) 100 UNIT/ML injection Inject 28 Units into the skin 2 (two) times  daily with a meal.    Insulin Pen Needle 31G X 8 MM MISC Use to inject insulin four times daily E11.41    metFORMIN (GLUCOPHAGE) 1000 MG tablet TAKE ONE-HALF TABLET BY  MOUTH TWICE DAILY WITH A  MEAL (Patient taking differently: Take 500 mg by mouth 2 (two) times daily.)    metoprolol tartrate (LOPRESSOR) 25 MG tablet Take 0.5 tablets (12.5 mg total) by mouth 2 (two) times daily.    Naphazoline HCl (CLEAR EYES OP) Place 1 drop into both eyes daily as needed (dry eyes/itching).    nitrofurantoin, macrocrystal-monohydrate, (MACROBID) 100 MG capsule Take 1 capsule (100 mg total) by mouth 2 (two) times daily.    nystatin (MYCOSTATIN/NYSTOP) powder Apply 1 application topically 3 (three) times daily.    Potassium Chloride ER 20 MEQ TBCR Take 20 mEq by mouth daily with lunch.    Semaglutide (RYBELSUS) 14 MG TABS Take 14 mg by mouth daily.    sodium chloride (OCEAN) 0.65 % SOLN nasal spray Place 1 spray into both nostrils 4 (four) times daily. (Patient taking differently: Place 1 spray into both nostrils 4 (four) times daily as needed for congestion.)    torsemide (DEMADEX) 10 MG tablet Take 2 tablets (20 mg total) by mouth daily.    No facility-administered encounter medications on file as of 09/27/2021.   Recent Relevant Labs: Lab Results  Component Value Date/Time   HGBA1C 9.8 (H) 06/12/2021 02:36 PM  HGBA1C 12.6 (H) 03/14/2021 05:06 PM   HGBA1C 11.2 03/20/2020 11:40 AM   HGBA1C 11.2 (A) 03/20/2020 11:40 AM   HGBA1C 11.2 (A) 03/20/2020 11:40 AM   MICROALBUR 20.4 (H) 03/20/2020 11:57 AM   MICROALBUR 12.1 (H) 05/10/2019 10:50 AM    Kidney Function Lab Results  Component Value Date/Time   CREATININE 0.67 07/13/2021 02:31 AM   CREATININE 0.90 07/11/2021 01:57 AM   CREATININE 0.63 02/09/2013 02:47 PM   GFR 52.42 (L) 06/12/2021 02:36 PM   GFRNONAA >60 07/13/2021 02:31 AM   GFRNONAA >89 02/09/2013 02:47 PM   GFRAA 99 06/30/2020 09:09 AM   GFRAA >89 02/09/2013 02:47 PM    Current  antihyperglycemic regimen:  Metformin 1000 mg - 1/2 tab BID Relion Novolin 70/30 28 units twice day ($25/vial at [facility], pt requires 3 vials/month) Rybelsus 14 mg daily - takes it after eating Freestyle Libre 2  What recent interventions/DTPs have been made to improve glycemic control:  None noted Have there been any recent hospitalizations or ED visits since last visit with CPP? No Patient denies hypoglycemic symptoms, including None Patient denies hyperglycemic symptoms, including none How often are you checking your blood sugar? Patient states she does not check her blood sugar every day because sometimes she forgets. She states that she does not know how to read the [facility] sensor that she wears on her arm What are your blood sugars ranging? Patient states she does not know her ranges. She knows that because she has a uti her blood sugar has been running high around 330 During the week, how often does your blood glucose drop below 70? Never Are you checking your feet daily/regularly? Patient states that she has swelling in her feet  Adherence Review: Is the patient currently on a STATIN medication? Yes Is the patient currently on ACE/ARB medication? No Does the patient have >5 day gap between last estimated fill dates? No  Care Gaps: Colonoscopy-07/01/12 Diabetic Foot Exam-03/20/20 Mammogram-NA Ophthalmology-03/16/20 Dexa Scan - NA Annual Well Visit - NA Micro albumin-03/20/20 Hemoglobin A1c- 06/12/21  Star Rating Drugs: Atorvastatin 80 mg last fill 03/18/21 90 ds Metformin 1000 mg last fill 08/01/21 90 ds  [name] Clinical Pharmacist Assistant [phone]

## 2021-09-28 DIAGNOSIS — I5032 Chronic diastolic (congestive) heart failure: Secondary | ICD-10-CM | POA: Diagnosis not present

## 2021-09-28 DIAGNOSIS — L89150 Pressure ulcer of sacral region, unstageable: Secondary | ICD-10-CM | POA: Diagnosis not present

## 2021-09-28 DIAGNOSIS — I890 Lymphedema, not elsewhere classified: Secondary | ICD-10-CM | POA: Diagnosis not present

## 2021-09-28 DIAGNOSIS — E1140 Type 2 diabetes mellitus with diabetic neuropathy, unspecified: Secondary | ICD-10-CM | POA: Diagnosis not present

## 2021-09-28 DIAGNOSIS — I872 Venous insufficiency (chronic) (peripheral): Secondary | ICD-10-CM | POA: Diagnosis not present

## 2021-09-28 DIAGNOSIS — E1151 Type 2 diabetes mellitus with diabetic peripheral angiopathy without gangrene: Secondary | ICD-10-CM | POA: Diagnosis not present

## 2021-09-28 NOTE — Telephone Encounter (Signed)
Unable to get in contact with the patient. Phone went straight to voicemail. LVM asking her to schedule a appointment with Dr. Okey Dupre. Office number was provided.

## 2021-10-02 ENCOUNTER — Other Ambulatory Visit: Payer: Self-pay

## 2021-10-02 ENCOUNTER — Ambulatory Visit (INDEPENDENT_AMBULATORY_CARE_PROVIDER_SITE_OTHER): Payer: Medicare Other | Admitting: Pharmacist

## 2021-10-02 DIAGNOSIS — E1122 Type 2 diabetes mellitus with diabetic chronic kidney disease: Secondary | ICD-10-CM

## 2021-10-02 DIAGNOSIS — E1140 Type 2 diabetes mellitus with diabetic neuropathy, unspecified: Secondary | ICD-10-CM

## 2021-10-02 DIAGNOSIS — N1831 Chronic kidney disease, stage 3a: Secondary | ICD-10-CM

## 2021-10-02 DIAGNOSIS — I10 Essential (primary) hypertension: Secondary | ICD-10-CM

## 2021-10-02 DIAGNOSIS — I872 Venous insufficiency (chronic) (peripheral): Secondary | ICD-10-CM

## 2021-10-02 DIAGNOSIS — E785 Hyperlipidemia, unspecified: Secondary | ICD-10-CM

## 2021-10-02 DIAGNOSIS — Z794 Long term (current) use of insulin: Secondary | ICD-10-CM

## 2021-10-02 DIAGNOSIS — E1169 Type 2 diabetes mellitus with other specified complication: Secondary | ICD-10-CM

## 2021-10-02 NOTE — Progress Notes (Signed)
Chronic Care Management Pharmacy Note  10/04/2021 Name:  [name] MRN:  [id number] DOB:  1946-11-23  Summary: -Pt reports Freestyle Elenor Legato is beeping "all the time"; counseled it will only beep when BG is too low, too high or sensor needs changing; she does not scan the device very often; she recalls recent readings in 200s-300s -Per pharmacy fill dates, pt is probably not taking metoprolol, citalopram, potassium, ezetimibe consistently  Recommendations/Changes made from today's visit: -Advised to increase Novolin 70/30 to 30 units BID -Utilize [facility] for medication synchronization, packaging and delivery   Subjective: [name] is an 75 y.o. year old female who is a primary patient of Hoyt Koch, MD.  The CCM team was consulted for assistance with disease management and care coordination needs.    Engaged with patient by telephone for follow up visit in response to provider referral for pharmacy case management and/or care coordination services.   Consent to Services:  The patient was given information about Chronic Care Management services, agreed to services, and gave verbal consent prior to initiation of services.  Please see initial visit note for detailed documentation.   Patient Care Team: Hoyt Koch, MD as PCP - General (Internal Medicine) Skeet Latch, MD as PCP - Cardiology (Cardiology) [place], [place], [place] (Optometry) [place], [place], [facility] as Pharmacist (Pharmacist)  Patient is from [place] but moved away after HS. She moved back to [place] when her sister passed at age 51. Her husband left her around the same time. She does not have any family close by and lives alone. She does not drive, she uses Medicare transportation service to get to doctor's appts however she has not had good experiences with them and has missed appts because of it.   Recent office visits: 08/14/21 Dr Sharlet Salina OV: hospital/rehab f/u (fell  in bathtub x 3-4 days); doing PT; no med changes.   06/12/21- Dr. Sharlet Salina- PCP. Follow up. Increased Rybelsus from 7 mg to 14 mg daily. Increased torsemide to 1-2 tablets daily. Follow up in 3-6 months.   03/22/21- Dr. Sharlet Salina- PCP. Hospital follow up. Increased Rybelsus from 3 mg to 7 mg. Follow up in 2-3 months.    02/22/21- Dr. Sharlet Salina- PCP- DM follow up. Increased Lipitor to 80 mg daily to take along with Zetia. Increased lasix to 80 mg to help with swelling and wound. Started Rybelsus 3 mg. Restarted metoprolol 1/2 tablet daily. Follow up in 4 weeks.   05/04/21- Dr. Sharlet Salina- PCP- Video Visit- Bilateral lower extremity cellulitis. Started doxycycline. Nystatin powder and potassium refilled. Follow up PRN.  05/26/20 Dr Sharlet Salina OV: hospital f/u (NSTEMI w/ stents, s/p TAVR). Metformin contributes to diarrhea. Off ARB due to AKI.  Recent consult visits: 09/25/21 telephone - home health identified UTI. PCP sent Macrobic x 7 days  06/07/21- Angelena Form, PA-C- Cardiology- Follow up on transcatheter aortic valve replacement. Amoxicillin provided for future dental appointments.  03/14/21- Jeri Cos, PA-C- Wound care bilateral calf ulcers. Referred to ED.    03/07/21- Jeri Cos, PA-C- Wound care bilateral calf ulcers.  09/25/20 Dr Oval Linsey (cardiology): f/u aortic stenosis s/p TAVR, venous insufficiency. Hx statin intolerance (tried 3). Continue Plavix through 12/9 then continue aspirin alone.   06/15/20 PA Angelena Form (cardiology): s/p TAVR. Worsening LE edema. Increase furosemide to 40 mg daily.  Hospital visits: Medication Reconciliation was completed by comparing discharge summary, patient's EMR and Pharmacy list, and upon discussion with patient.   Admitted to the hospital on 07/09/21 due to  fall at home. Discharge date was 07/13/21. Discharged from [facility]?Medications Started at [facility] Discharge:?? -No new medications   Medication Changes at Hospital  Discharge: -Changed insulin 70/30 from 45 units twice daily to 30 unit twice daily. Decreased gabapentin from TID to BID.    Medications Discontinued at Hospital Discharge: -No medications discontinued   Medications that remain the same after Hospital Discharge:??  -All other medications will remain the same.     Admitted to the hospital on 03/14/21 due to cellulitis in foot. Discharge date was 03/18/21. Discharged from [place]?Medications Started at [facility] Discharge:?? -started Doxycycline due to cellulitis, potassium and torsemide.    Medication Changes at Hospital Discharge: -Changed Atorvastatin to 40 mg daily from 80 mg.    Medications Discontinued at Hospital Discharge: -Stopped Furosemide due to switching to torsemide   Medications that remain the same after Hospital Discharge:??  -All other medications will remain the same.    Objective:  Lab Results  Component Value Date   CREATININE 0.67 07/13/2021   BUN 17 07/13/2021   GFR 52.42 (L) 06/12/2021   GFRNONAA >60 07/13/2021   GFRAA 99 06/30/2020   NA 136 07/13/2021   K 4.3 07/13/2021   CALCIUM 8.4 (L) 07/13/2021   CO2 29 07/13/2021    Lab Results  Component Value Date/Time   HGBA1C 9.8 (H) 06/12/2021 02:36 PM   HGBA1C 12.6 (H) 03/14/2021 05:06 PM   HGBA1C 11.2 03/20/2020 11:40 AM   HGBA1C 11.2 (A) 03/20/2020 11:40 AM   HGBA1C 11.2 (A) 03/20/2020 11:40 AM   GFR 52.42 (L) 06/12/2021 02:36 PM   GFR 86.05 02/22/2021 03:36 PM   MICROALBUR 20.4 (H) 03/20/2020 11:57 AM   MICROALBUR 12.1 (H) 05/10/2019 10:50 AM    Last diabetic Eye exam:  Lab Results  Component Value Date/Time   HMDIABEYEEXA Retinopathy (A) 03/16/2020 11:43 AM    Last diabetic Foot exam:  Lab Results  Component Value Date/Time   HMDIABFOOTEX Done 11/09/2010 12:00 AM     Lab Results  Component Value Date   CHOL 180 06/12/2021   HDL 44.60 06/12/2021   LDLCALC 99 06/12/2021   LDLDIRECT 110.0 11/12/2018   TRIG 180.0  (H) 06/12/2021   CHOLHDL 4 06/12/2021    Hepatic Function Latest Ref Rng & Units 07/10/2021 07/09/2021 06/12/2021  Total Protein 6.5 - 8.1 g/dL 5.7(L) 6.3(L) 7.6  Albumin 3.5 - 5.0 g/dL 2.2(L) 2.4(L) 3.8  AST 15 - 41 U/L '24 22 25  ' ALT 0 - 44 U/L 19 21 45(H)  Alk Phosphatase 38 - 126 U/L 124 128(H) 179(H)  Total Bilirubin 0.3 - 1.2 mg/dL 0.7 2.0(H) 0.4    Lab Results  Component Value Date/Time   TSH 4.436 07/09/2021 09:02 PM   TSH 4.000 05/14/2020 04:45 AM   TSH 2.15 08/08/2015 10:56 AM   TSH 1.306 12/19/2014 06:27 PM    CBC Latest Ref Rng & Units 07/11/2021 07/10/2021 07/09/2021  WBC 4.0 - 10.5 K/uL 9.2 10.2 13.2(H)  Hemoglobin 12.0 - 15.0 g/dL 12.4 13.0 13.9  Hematocrit 36.0 - 46.0 % 38.1 38.6 42.0  Platelets 150 - 400 K/uL 189 217 216    No results found for: VD25OH  Clinical ASCVD: Yes  The ASCVD Risk score (Arnett DK, et al., 2019) failed to calculate for the following reasons:   The patient has a prior MI or stroke diagnosis    Depression screen [facility] 2/9 05/23/2020 06/28/2019 06/07/2019  Decreased  Interest 0 3 1  Down, Depressed, Hopeless 0 3 1  PHQ - 2 Score 0 6 2  Altered sleeping - 3 3  Tired, decreased energy - 3 3  Change in appetite - 1 3  Feeling bad or failure about yourself  - 2 3  Trouble concentrating - 0 3  Moving slowly or fidgety/restless - 0 0  Suicidal thoughts - 0 0  PHQ-9 Score - 15 17  Some recent data might be hidden     Social History   Tobacco Use  Smoking Status Never  Smokeless Tobacco Never   BP Readings from Last 3 Encounters:  08/14/21 132/68  07/13/21 (!) 115/53  06/12/21 136/68   Pulse Readings from Last 3 Encounters:  08/14/21 70  07/13/21 75  06/12/21 (!) 54   Wt Readings from Last 3 Encounters:  08/14/21 172 lb 6.4 oz (78.2 kg)  07/09/21 188 lb (85.3 kg)  06/12/21 183 lb 12.8 oz (83.4 kg)   BMI Readings from Last 3 Encounters:  08/14/21 29.59 kg/m  07/09/21 32.27 kg/m  06/12/21 31.55 kg/m    Assessment/Interventions:  Review of patient past medical history, allergies, medications, health status, including review of consultants reports, laboratory and other test data, was performed as part of comprehensive evaluation and provision of chronic care management services.   SDOH:  (Social Determinants of Health) assessments and interventions performed: Yes   SDOH Screenings   Alcohol Screen: Not on file  Depression (PHQ2-9): Not on file  Financial Resource Strain: Medium Risk   Difficulty of Paying Living Expenses: Somewhat hard  Food Insecurity: Not on file  Housing: Not on file  Physical Activity: Not on file  Social Connections: Not on file  Stress: Not on file  Tobacco Use: Low Risk    Smoking Tobacco Use: Never   Smokeless Tobacco Use: Never   Passive Exposure: Not on file  Transportation Needs: Unmet Transportation Needs   Lack of Transportation (Medical): Yes   Lack of Transportation (Non-Medical): Yes   CCM Care Plan  Allergies  Allergen Reactions   Morphine Other (See Comments)    'took me out of this world' I had to be resuscitated   Codeine Sulfate Nausea Only and Other (See Comments)    GI upset and pain   Demerol [Meperidine] Nausea And Vomiting   Propoxyphene Hcl Other (See Comments)    Darvocet caused sick headache    Medications Reviewed Today     Reviewed by Charlton Haws, [facility] (Pharmacist) on 10/04/21 at [place] List Status: <None>   Medication Order Taking? Sig Documenting Provider Last Dose Status Informant  acetaminophen (TYLENOL) 325 MG tablet [id number] Yes Take 325-650 mg by mouth daily as needed for mild pain or headache. [provider] Taking Active Multiple Informants  amoxicillin (AMOXIL) 500 MG tablet [id number] Yes Take 4 tablets (2,000 mg total) by mouth as directed. 1 hour prior to dental work including cleanings  Patient taking differently: Take 2,000 mg by mouth See admin instructions. Take 4 tablets (2000 mg) by mouth 1 hour prior to dental  work including cleanings   Crista Luria Taking Active   ASPERCREME LIDOCAINE EX [id number] Yes Apply 1 application topically 5 (five) times daily as needed (pain). [provider] Taking Active Multiple Informants  aspirin 81 MG tablet [id number] Yes Take 81 mg by mouth daily. [provider] Taking Active Multiple Informants  atorvastatin (LIPITOR) 80 MG tablet [id number] Yes Take 1 tablet (80 mg total) by  mouth daily. Hoyt Koch, MD Taking Active   citalopram (CELEXA) 20 MG tablet [id number] Yes Take 1 tablet (20 mg total) by mouth daily. Hoyt Koch, MD Taking Active   collagenase Annitta Needs) ointment [id number] Yes Apply 1 application topically 3 (three) times daily. Apply to sacrum [provider] Taking Active   Continuous Blood Gluc Sensor ([place]) [place] [id number] Yes Use to monitor sugars Hoyt Koch, MD Taking Active   esomeprazole (NEXIUM) 20 MG capsule [id number] Yes Take 1 capsule (20 mg total) by mouth daily at 12 noon.  Patient taking differently: Take 20 mg by mouth every morning.   Hoyt Koch, MD Taking Active            Med Note Rosine Beat Jul 09, 2021  8:34 PM) OTC  ezetimibe (ZETIA) 10 MG tablet [id number] Yes Take 1 tablet (10 mg total) by mouth daily. Hoyt Koch, MD Taking Active   gabapentin (NEURONTIN) 300 MG capsule [id number] Yes Take 1 capsule (300 mg total) by mouth 2 (two) times daily. Domenic Polite, MD Taking Active   glucose blood [facility] VERIO) test strip [id number] Yes USE AS DIRECTED Hoyt Koch, MD Taking Active Multiple Informants  insulin NPH-regular Human (70-30) 100 UNIT/ML injection [id number] Yes Inject 28 Units into the skin 2 (two) times daily with a meal. Domenic Polite, MD Taking Active   Insulin Pen Needle 31G X 8 MM MISC [id number] Yes Use to inject insulin four times daily E11.41 Hoyt Koch, MD Taking Active  Multiple Informants  metFORMIN (GLUCOPHAGE) 500 MG tablet [id number] Yes Take 1 tablet (500 mg total) by mouth 2 (two) times daily with a meal. Hoyt Koch, MD Taking Active   metoprolol tartrate (LOPRESSOR) 25 MG tablet [id number] Yes Take 0.5 tablets (12.5 mg total) by mouth 2 (two) times daily. Hoyt Koch, MD Taking Active   Naphazoline HCl (CLEAR EYES OP) [id number] Yes Place 1 drop into both eyes daily as needed (dry eyes/itching). [provider] Taking Active Multiple Informants  nystatin (MYCOSTATIN/NYSTOP) powder [id number] Yes Apply 1 application topically 3 (three) times daily. Hoyt Koch, MD Taking Active Multiple Informants  Potassium Chloride ER 20 MEQ TBCR [id number] Yes Take 20 mEq by mouth daily. Hoyt Koch, MD Taking Active   Semaglutide [facility]) 14 MG TABS [id number] Yes Take 14 mg by mouth daily. Hoyt Koch, MD Taking Active   sodium chloride (OCEAN) 0.65 % SOLN nasal spray [id number] Yes Place 1 spray into both nostrils 4 (four) times daily.  Patient taking differently: Place 1 spray into both nostrils 4 (four) times daily as needed for congestion.   Hoyt Koch, MD Taking Active   torsemide [facility]) 20 MG tablet [id number] Yes Take 1 tablet (20 mg total) by mouth daily. 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   Near syncope 05/11/2020   Pressure injury of skin 05/11/2020   Orthostatic hypotension 05/11/2020   Dental caries 05/11/2020   Aortic stenosis    Diabetic neuropathy ([place]) 11/13/2018   Left knee pain 10/24/2017   Greater trochanteric bursitis of left hip 10/24/2017   Atypical chest pain 10/10/2015   Reactive airway disease 04/09/2014   Seasonal allergic rhinitis 04/09/2014   Preventative health care 04/09/2014  Abnormality of gait  03/17/2013   Irritable bowel syndrome (IBS) 08/27/2012   Shoulder pain 09/25/2009   Type II diabetes mellitus with neurological manifestations, uncontrolled 06/18/2007   GERD 06/18/2007   Hyperlipidemia associated with type 2 diabetes mellitus ([place]) 12/27/2006   Essential hypertension 09/19/2006   Venous (peripheral) insufficiency 09/19/2006    Immunization History  Administered Date(s) Administered   Fluad Quad(high Dose 65+) 08/30/2019, 09/09/2020, 08/14/2021   Influenza Split 08/27/2012   Influenza Whole 09/27/2005, 09/18/2007, 08/29/2009, 11/09/2010   Influenza, High Dose Seasonal PF 10/21/2017, 11/12/2018   Influenza,inj,Quad PF,6+ Mos 10/22/2013, 12/20/2014, 10/09/2015, 07/29/2016   Moderna SARS-COV2 Booster Vaccination 07/13/2021   PFIZER(Purple Top)SARS-COV-2 Vaccination 02/03/2020, 02/29/2020   Pneumococcal Conjugate-13 07/23/2016   Pneumococcal Polysaccharide-23 02/09/2013, 12/20/2014   Tdap 08/27/2012   Zoster, Live 02/19/2013    Conditions to be addressed/monitored:  Hypertension, Hyperlipidemia, Diabetes, Coronary Artery Disease, GERD and Depression, Neuropathy  Care Plan : [place]  Updates made by Charlton Haws, [place] since 10/04/2021 12:00 AM     Problem: Hypertension, Hyperlipidemia, Diabetes, Coronary Artery Disease, GERD and Depression, Neuropathy   Priority: High     Long-Range Goal: Disease management   Start Date: 01/19/2021  Expected End Date: 08/27/2022  This Visit's Progress: Not on track  Recent Progress: On track  Priority: High  Note:   Current Barriers:  Unable to independently monitor therapeutic efficacy Suboptimal therapeutic regimen for diabetes Does not adhere to prescribed medication regimen Does not maintain contact with provider office  Pharmacist Clinical Goal(s):  Patient will achieve adherence to monitoring guidelines and medication adherence to achieve therapeutic efficacy adhere to plan to optimize  therapeutic regimen for diabetes as evidenced by report of adherence to recommended medication management changes through collaboration with PharmD and provider.   Interventions: 1:1 collaboration with Hoyt Koch, MD regarding development and update of comprehensive plan of care as evidenced by provider attestation and co-signature Inter-disciplinary care team collaboration (see longitudinal plan of care) Comprehensive medication review performed; medication list updated in electronic medical record  Heart Failure / Hypertension (Goal: BP < 140/90, and prevent exacerbations) -Improving - pt does not endorse swelling problems currently;  -Pt is not checking BP at home - monitor is broken -HF Type: Diastolic (grade 2 dysfunction) -Ejection fraction: 60-65% (Date: 06/15/20) -Current treatment Metoprolol tartrate 25 mg - 1/2 tab BID - not on dispense report Torsemide 20 mg daily - takes 1 tab daily, extra tab PRN Potassium 20 mEq daily - Rx ran out recently, not taking -Medications previously tried: lisinopril (stopped during hospitalization for AKI, never restarted) -Counseld on importance of potassium supplement while taking torsemide -Refilled potassium; Recommended to continue current medication  Hyperlipidemia: (LDL goal < 70) -Uncontrolled - LDL is above goal; it is not clear if atorvastatin 80 mg filled through mail order pharmacy when prescribed last month; pt is not near her medication bottles to check -Current treatment: Atorvastatin 80 mg daily - unclear if taking or not Ezetimibe 10 mg daily Aspirin 81 mg daily -Medications previously tried: pravastatin  -Educated on Cholesterol goals; Benefits of statin for ASCVD risk reduction; -Recommend to continue current medication  Diabetes (A1c goal <8%) -Improving - A1c improved from >12% to 9.8% after stating Rybelsus; she endorses compliance with medication and insulin but has previously struggled with adherence; she reports  she is wearing Freestyle Libre sensor and reports it "beeps all the time" and she does not scan it very often; -Current medications: Metformin 1000 mg - 1/2 tab  BID Relion Novolin 70/30 27 units twice day ($25/vial at [facility], pt requires 3 vials/month) Rybelsus 14 mg daily - takes it after eating Freestyle Libre 2 Relion glucometer and test strips -Medications previously tried: Engineer, agricultural, Novolog -Counseled on hypoglycemia prevention and management -Advised pt to scan [place] sensor before breakfast, whenever it beeps, and when she feels shaky/clammy; counseled pt that device will only beep when BG is <70 or > 250, so her BG is probably out of range often -Plan: Increase Novolin 70/30 to 30 units BID; recommend changing to [name] for improved pharmacokinetics/less variability -Changed metformin to 500 mg tablets to limit pill cutting  Patient Goals/Self-Care Activities Patient will:  - take medications as prescribed -focus on medication adherence by pill box -check glucose via Freestyle Libre 2 - keep reader in pocket at all times -Call [place] to refill Freestyle Libre sensors -Increase Insulin to 30 units twice daily      Compliance/Adherence/Medication fill history: Care Gaps: Foot exam - 03/20/21 Eye exam - 03/16/21 Urine microalbumin - 03/20/21  Star-Rating Drugs: Atorvastatin - LF 03/18/21 x 90 ds Rybelsus - LF 06/12/21 x 90 ds Metformin - LF 08/01/21 x 90 ds   Medication Assistance: None required.  Patient affirms current coverage meets needs.  Patient's preferred pharmacy is:  [facility], [place] [address] Phone: [phone] Fax: [phone]  [facility] - [place], [place] - [address] [place] [address] Phone: [phone] Fax: [phone]  Uses pill box? Yes - monthly box Pt endorses 100% compliance  We discussed: Current pharmacy  is preferred with insurance plan and patient is satisfied with pharmacy services Patient decided to: Continue current medication management strategy  Care Plan and Follow Up Patient Decision:  Patient agrees to Care Plan and Follow-up.  Plan: Telephone follow up appointment with care management team member scheduled for:  1 month  Charlene Brooke, PharmD, [place], CPP Clinical Pharmacist [facility] at [facility] [phone]

## 2021-10-03 ENCOUNTER — Telehealth: Payer: Self-pay | Admitting: Pharmacist

## 2021-10-03 DIAGNOSIS — E1140 Type 2 diabetes mellitus with diabetic neuropathy, unspecified: Secondary | ICD-10-CM | POA: Diagnosis not present

## 2021-10-03 DIAGNOSIS — I890 Lymphedema, not elsewhere classified: Secondary | ICD-10-CM | POA: Diagnosis not present

## 2021-10-03 DIAGNOSIS — E1151 Type 2 diabetes mellitus with diabetic peripheral angiopathy without gangrene: Secondary | ICD-10-CM | POA: Diagnosis not present

## 2021-10-03 DIAGNOSIS — I5032 Chronic diastolic (congestive) heart failure: Secondary | ICD-10-CM | POA: Diagnosis not present

## 2021-10-03 DIAGNOSIS — L89150 Pressure ulcer of sacral region, unstageable: Secondary | ICD-10-CM | POA: Diagnosis not present

## 2021-10-03 DIAGNOSIS — I872 Venous insufficiency (chronic) (peripheral): Secondary | ICD-10-CM | POA: Diagnosis not present

## 2021-10-03 NOTE — Progress Notes (Signed)
Chronic Care Management Pharmacy Assistant   Name: [name]  MRN: [id number] DOB: [birth year]   Medications: Outpatient Encounter Medications as of 10/03/2021  Medication Sig Note   acetaminophen (TYLENOL) 325 MG tablet Take 325-650 mg by mouth daily as needed for mild pain or headache.    amoxicillin (AMOXIL) 500 MG tablet Take 4 tablets (2,000 mg total) by mouth as directed. 1 hour prior to dental work including cleanings (Patient taking differently: Take 2,000 mg by mouth See admin instructions. Take 4 tablets (2000 mg) by mouth 1 hour prior to dental work including cleanings)    ASPERCREME LIDOCAINE EX Apply 1 application topically 5 (five) times daily as needed (pain).    aspirin 81 MG tablet Take 81 mg by mouth daily.    atorvastatin (LIPITOR) 80 MG tablet Take 1 tablet (80 mg total) by mouth daily.    citalopram (CELEXA) 20 MG tablet TAKE 1 TABLET BY MOUTH  DAILY (Patient taking differently: Take 20 mg by mouth daily.)    collagenase (SANTYL) ointment Apply 1 application topically 3 (three) times daily. Apply to sacrum    Continuous Blood Gluc Sensor (FREESTYLE LIBRE SENSOR SYSTEM) MISC Use to monitor sugars    esomeprazole (NEXIUM) 20 MG capsule Take 1 capsule (20 mg total) by mouth daily at 12 noon. (Patient taking differently: Take 20 mg by mouth every morning.) 07/09/2021: OTC   ezetimibe (ZETIA) 10 MG tablet Take 1 tablet (10 mg total) by mouth daily.    gabapentin (NEURONTIN) 300 MG capsule Take 1 capsule (300 mg total) by mouth 2 (two) times daily.    glucose blood (ONETOUCH VERIO) test strip USE AS DIRECTED    insulin NPH-regular Human (70-30) 100 UNIT/ML injection Inject 28 Units into the skin 2 (two) times daily with a meal.    Insulin Pen Needle 31G X 8 MM MISC Use to inject insulin four times daily E11.41    metFORMIN (GLUCOPHAGE) 1000 MG tablet TAKE ONE-HALF TABLET BY  MOUTH TWICE DAILY WITH A  MEAL (Patient taking differently: Take 500 mg by mouth 2 (two)  times daily.)    metoprolol tartrate (LOPRESSOR) 25 MG tablet Take 0.5 tablets (12.5 mg total) by mouth 2 (two) times daily. (Patient not taking: Reported on 10/02/2021)    Naphazoline HCl (CLEAR EYES OP) Place 1 drop into both eyes daily as needed (dry eyes/itching).    nitrofurantoin, macrocrystal-monohydrate, (MACROBID) 100 MG capsule Take 1 capsule (100 mg total) by mouth 2 (two) times daily.    nystatin (MYCOSTATIN/NYSTOP) powder Apply 1 application topically 3 (three) times daily.    Potassium Chloride ER 20 MEQ TBCR Take 20 mEq by mouth daily with lunch.    Semaglutide (RYBELSUS) 14 MG TABS Take 14 mg by mouth daily.    sodium chloride (OCEAN) 0.65 % SOLN nasal spray Place 1 spray into both nostrils 4 (four) times daily. (Patient taking differently: Place 1 spray into both nostrils 4 (four) times daily as needed for congestion.)    torsemide (DEMADEX) 10 MG tablet Take 2 tablets (20 mg total) by mouth daily.    No facility-administered encounter medications on file as of 10/03/2021.    Pharmacist Review  The patient had an initial visit with Clinical Pharmacist [name] on 10/02/21. Upon completion of the visit the patient has agreed to try [facility] services for their dispensing and delivery of medications. Per clinical pharmacist request I completed an on-boarding form with the list of the patients medications, current pharmacy, demographics,  allergies and insurance information. The form was then forward to clinical pharmacist for review  [name] Clinical Pharmacist Assistant [phone]

## 2021-10-04 MED ORDER — EZETIMIBE 10 MG PO TABS: 10.0000 mg | ORAL_TABLET | Freq: Every day | ORAL | 1 refills | Status: DC

## 2021-10-04 MED ORDER — POTASSIUM CHLORIDE ER 20 MEQ PO TBCR: 20.0000 meq | EXTENDED_RELEASE_TABLET | Freq: Every day | ORAL | 1 refills | Status: DC

## 2021-10-04 MED ORDER — TORSEMIDE 20 MG PO TABS: 20.0000 mg | ORAL_TABLET | Freq: Every day | ORAL | 1 refills | Status: AC

## 2021-10-04 MED ORDER — CITALOPRAM HYDROBROMIDE 20 MG PO TABS: 20.0000 mg | ORAL_TABLET | Freq: Every day | ORAL | 1 refills | Status: AC

## 2021-10-04 MED ORDER — METFORMIN HCL 500 MG PO TABS: 500.0000 mg | ORAL_TABLET | Freq: Two times a day (BID) | ORAL | 1 refills | Status: DC

## 2021-10-04 MED ORDER — ATORVASTATIN CALCIUM 80 MG PO TABS: 80.0000 mg | ORAL_TABLET | Freq: Every day | ORAL | 0 refills | Status: DC

## 2021-10-04 MED ORDER — METOPROLOL TARTRATE 25 MG PO TABS: 12.5000 mg | ORAL_TABLET | Freq: Two times a day (BID) | ORAL | 1 refills | Status: DC

## 2021-10-04 MED ORDER — RYBELSUS 14 MG PO TABS
14.0000 mg | ORAL_TABLET | Freq: Every day | ORAL | 3 refills | Status: DC
Start: 2021-10-04 — End: 2022-10-24

## 2021-10-04 NOTE — Patient Instructions (Signed)
Visit Information  Phone number for Pharmacist: [phone]   Goals Addressed             This Visit's Progress    Monitor and Manage My Blood Sugar-Diabetes Type 2   Not on track    Timeframe:  Long-Range Goal Priority:  High Start Date:         01/19/21                    Expected End Date:    08/27/22            Follow Up Date Nov 2022   - check blood sugar at prescribed times - check blood sugar if I feel it is too high or too low - enter blood sugar readings and medication or insulin into daily log - take the blood sugar log to all doctor visits  -Keep [facility] reader in pocket at all times -Call [facility] [phone]) for [facility] sensor refills   Why is this important?   Checking your blood sugar at home helps to keep it from getting very high or very low.  Writing the results in a diary or log helps the doctor know how to care for you.  Your blood sugar log should have the time, date and the results.  Also, write down the amount of insulin or other medicine that you take.  Other information, like what you ate, exercise done and how you were feeling, will also be helpful.           Care Plan : CCM Pharmacy Care Plan  Updates made by Kathyrn Sheriff, RPH since 10/04/2021 12:00 AM     Problem: Hypertension, Hyperlipidemia, Diabetes, Coronary Artery Disease, GERD and Depression, Neuropathy   Priority: High     Long-Range Goal: Disease management   Start Date: 01/19/2021  Expected End Date: 08/27/2022  This Visit's Progress: Not on track  Recent Progress: On track  Priority: High  Note:   Current Barriers:  Unable to independently monitor therapeutic efficacy Suboptimal therapeutic regimen for diabetes Does not adhere to prescribed medication regimen Does not maintain contact with provider office  Pharmacist Clinical Goal(s):  Patient will achieve adherence to monitoring guidelines and medication adherence to achieve therapeutic  efficacy adhere to plan to optimize therapeutic regimen for diabetes as evidenced by report of adherence to recommended medication management changes through collaboration with PharmD and provider.   Interventions: 1:1 collaboration with Myrlene Broker, MD regarding development and update of comprehensive plan of care as evidenced by provider attestation and co-signature Inter-disciplinary care team collaboration (see longitudinal plan of care) Comprehensive medication review performed; medication list updated in electronic medical record  Heart Failure / Hypertension (Goal: BP < 140/90, and prevent exacerbations) -Improving - pt does not endorse swelling problems currently;  -Pt is not checking BP at home - monitor is broken -HF Type: Diastolic (grade 2 dysfunction) -Ejection fraction: 60-65% (Date: 06/15/20) -Current treatment Metoprolol tartrate 25 mg - 1/2 tab BID - not on dispense report Torsemide 20 mg daily - takes 1 tab daily, extra tab PRN Potassium 20 mEq daily - Rx ran out recently, not taking -Medications previously tried: lisinopril (stopped during hospitalization for AKI, never restarted) -Counseld on importance of potassium supplement while taking torsemide -Refilled potassium; Recommended to continue current medication  Hyperlipidemia: (LDL goal < 70) -Uncontrolled - LDL is above goal; it is not clear if atorvastatin 80 mg filled through mail order pharmacy when prescribed last month;  pt is not near her medication bottles to check -Current treatment: Atorvastatin 80 mg daily - unclear if taking or not Ezetimibe 10 mg daily Aspirin 81 mg daily -Medications previously tried: pravastatin  -Educated on Cholesterol goals; Benefits of statin for ASCVD risk reduction; -Recommend to continue current medication  Diabetes (A1c goal <8%) -Improving - A1c improved from >12% to 9.8% after stating Rybelsus; she endorses compliance with medication and insulin but has previously  struggled with adherence; she reports she is wearing Freestyle Libre sensor and reports it "beeps all the time" and she does not scan it very often; -Current medications: Metformin 1000 mg - 1/2 tab BID Relion Novolin 70/30 27 units twice day ($25/vial at [place], pt requires 3 vials/month) Rybelsus 14 mg daily - takes it after eating Freestyle Libre 2 Relion glucometer and test strips -Medications previously tried: Hospital doctor, Novolog -Counseled on hypoglycemia prevention and management -Advised pt to scan [place] sensor before breakfast, whenever it beeps, and when she feels shaky/clammy; counseled pt that device will only beep when BG is <70 or > 250, so her BG is probably out of range often -Plan: Increase Novolin 70/30 to 30 units BID; recommend changing to [place] for improved pharmacokinetics/less variability -Changed metformin to 500 mg tablets to limit pill cutting  Patient Goals/Self-Care Activities Patient will:  - take medications as prescribed -focus on medication adherence by pill box -check glucose via Freestyle Libre 2 - keep reader in pocket at all times -Call [facility] to refill Freestyle Libre sensors -Increase Insulin to 30 units twice daily      Patient verbalizes understanding of instructions provided today and agrees to view in [place].  Telephone follow up appointment with pharmacy team member scheduled for: 1 month  Al Corpus, PharmD, [place], CPP Clinical Pharmacist [facility] [phone]

## 2021-10-07 IMAGING — CT CT ANGIO CHEST
2 of 16 series · 16 of 37 positions shown · IV contrast (APPLIED)
Comparison: None.

CLINICAL DATA: 74-year-old female with history of severe aortic
stenosis. Preprocedural study prior to potential transcatheter
aortic valve replacement (TAVR) procedure.

EXAM:
CT ANGIOGRAPHY CHEST, ABDOMEN AND PELVIS
TECHNIQUE: Chest CTA 05/11/2020.  CT the abdomen and pelvis 03/27/2014.

[Series 9: 0-90% · axial · 0.36mm/px · z∈[+1296,+1502]mm · 8 of 4400 slices shown]
[im 489/4400  lung]
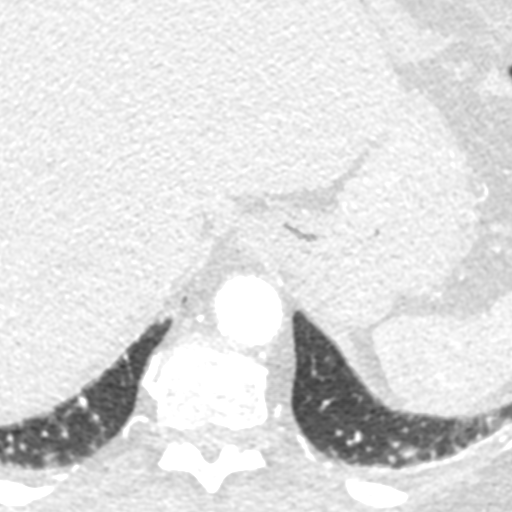
[im 978/4400  mediastinal]
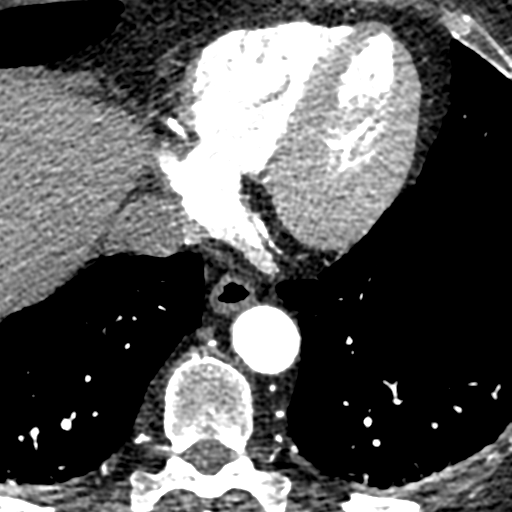
[im 1467/4400  lung]
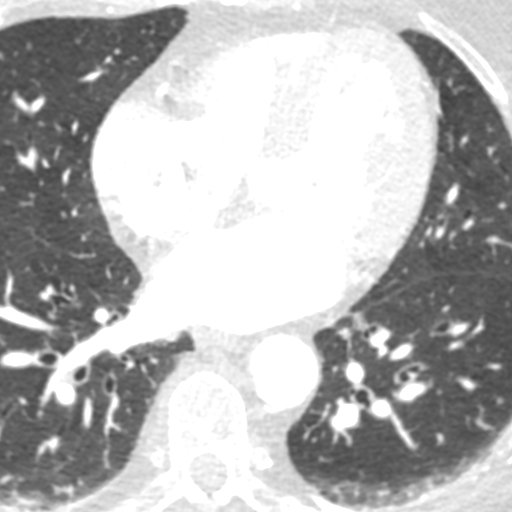
[im 1956/4400  mediastinal]
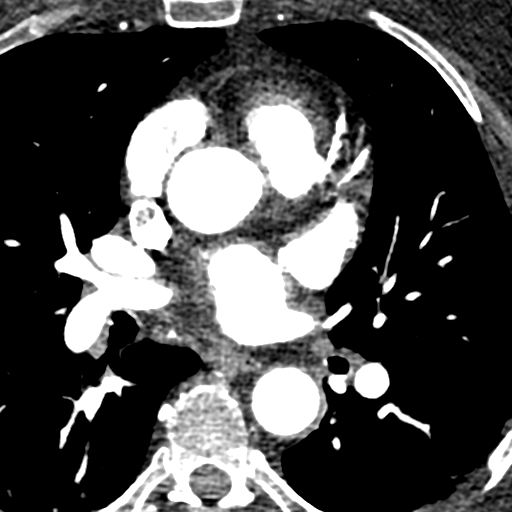
[im 2444/4400  lung]
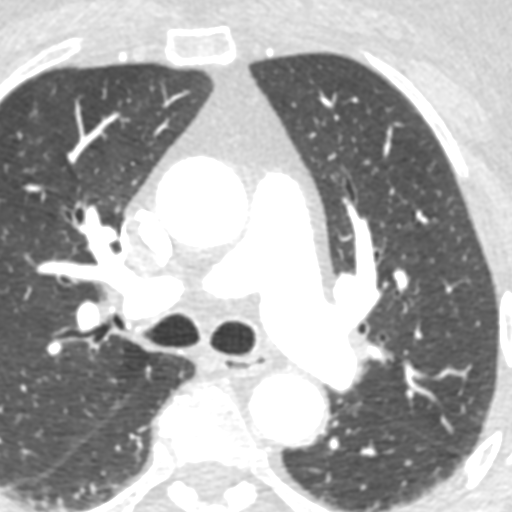
[im 2933/4400  mediastinal]
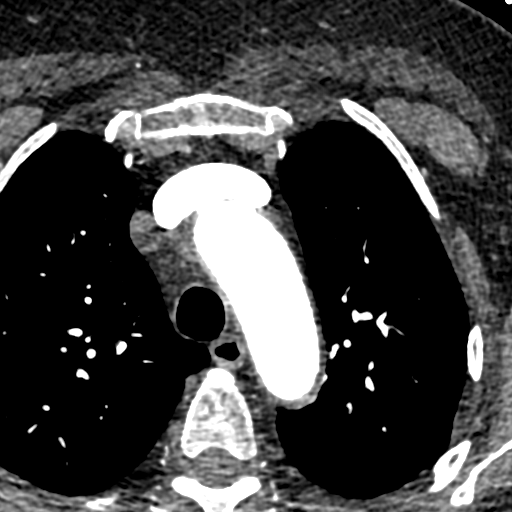
[im 3422/4400  lung]
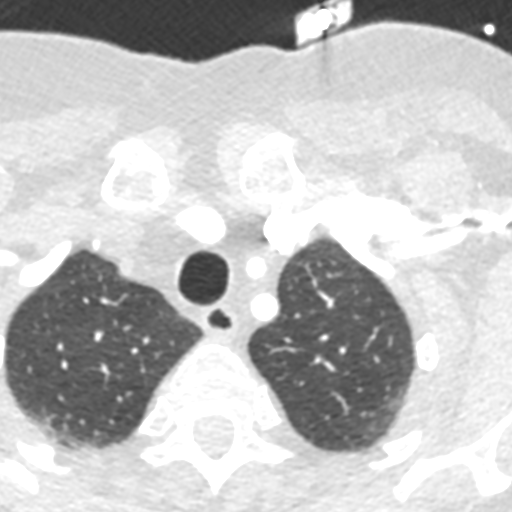
[im 3911/4400  mediastinal]
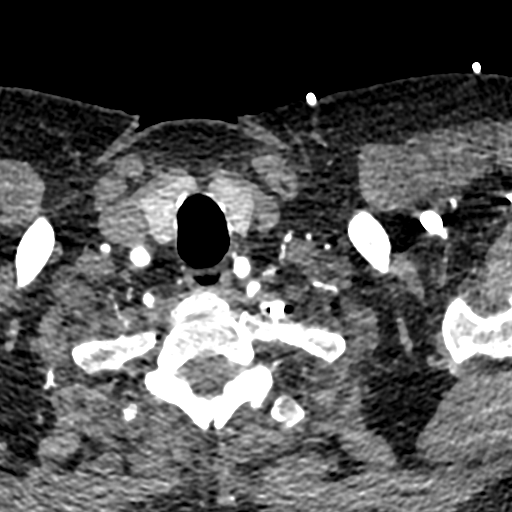

[Series 10: 5-95% · axial · 0.36mm/px · z∈[+1296,+1502]mm · 8 of 4400 slices shown]
[im 489/4400  lung]
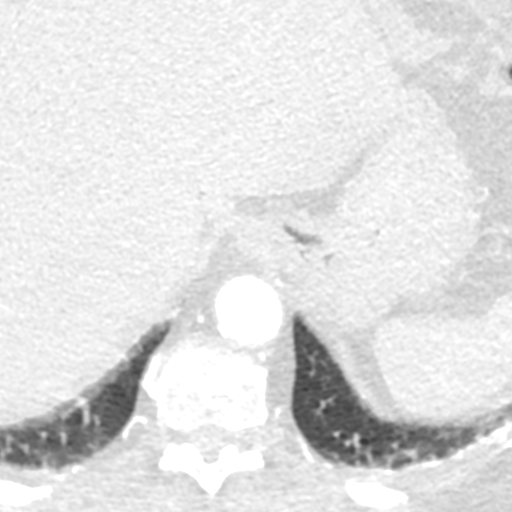
[im 978/4400  lung]
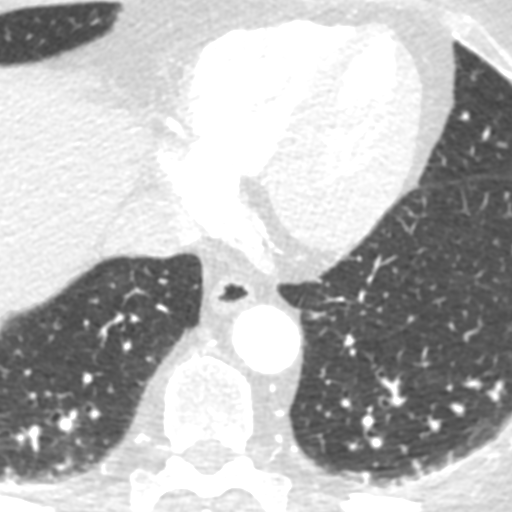
[im 1467/4400  lung]
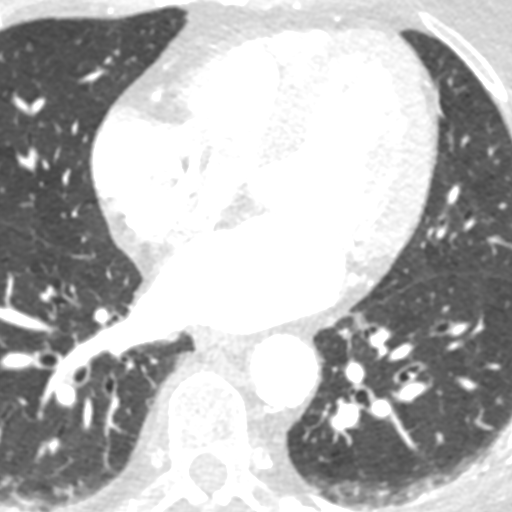
[im 1956/4400  lung]
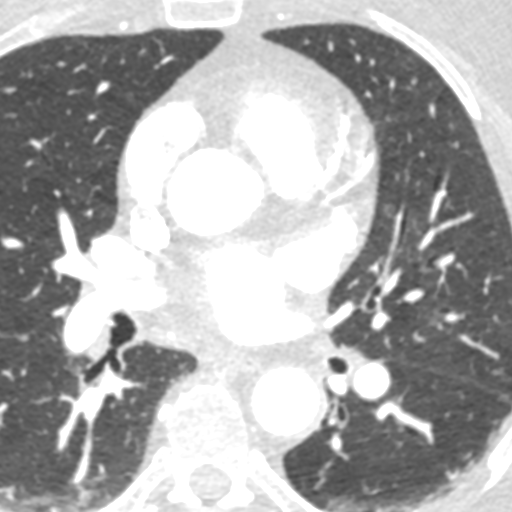
[im 2444/4400  lung]
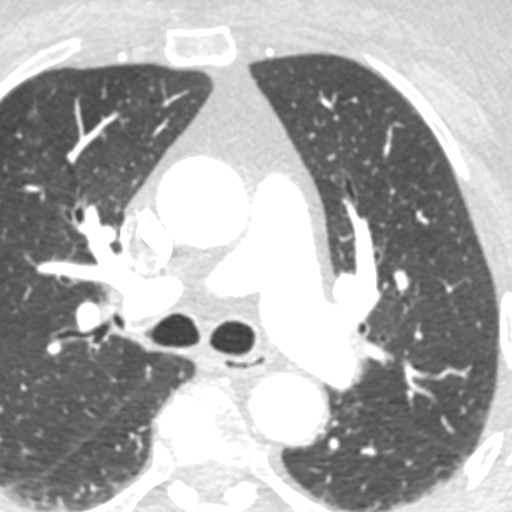
[im 2933/4400  lung]
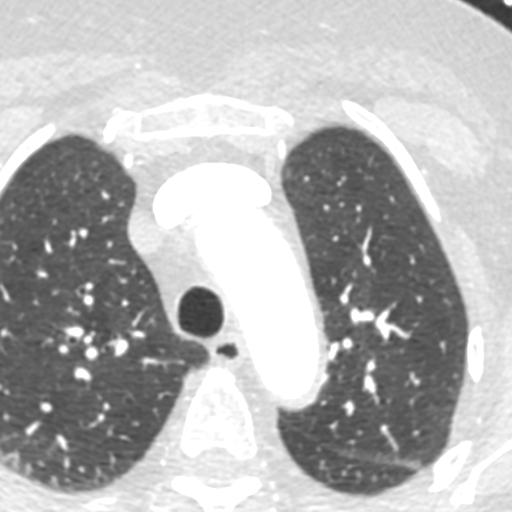
[im 3422/4400  lung]
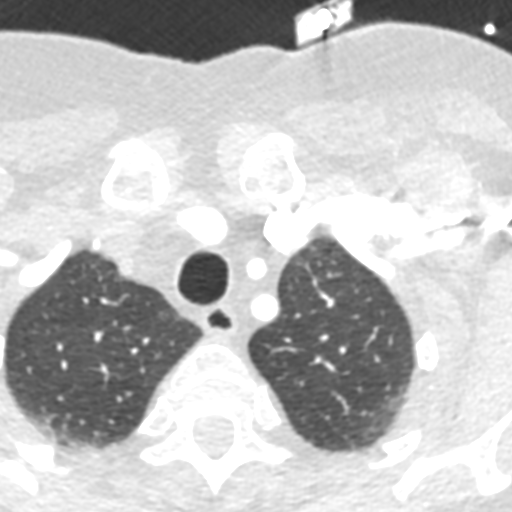
[im 3911/4400  lung]
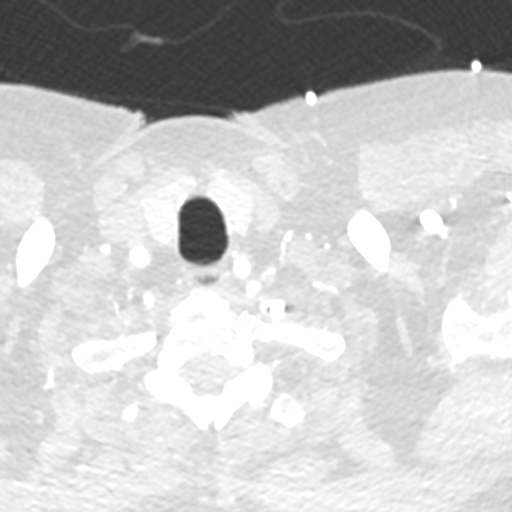

[16 of 37 positions shown; findings below may reference images not displayed]

Multidetector CT imaging through the chest, abdomen and pelvis was
performed using the standard protocol during bolus administration of
intravenous contrast. Multiplanar reconstructed images and MIPs were
obtained and reviewed to evaluate the vascular anatomy.

CONTRAST:  100mL OMNIPAQUE IOHEXOL 350 MG/ML SOLN
FINDINGS: CTA CHEST FINDINGS

Cardiovascular: Heart size is normal. There is no significant
pericardial fluid, thickening or pericardial calcification. There is
aortic atherosclerosis, as well as atherosclerosis of the great
vessels of the mediastinum and the coronary arteries, including
calcified atherosclerotic plaque in the left main, left anterior
descending, left circumflex and right coronary arteries. Thickening
calcification of the aortic valve. Calcifications of the mitral
annulus.

Mediastinum/Lymph Nodes: No pathologically enlarged mediastinal or
hilar lymph nodes. Esophagus is unremarkable in appearance. No
axillary lymphadenopathy.

Lungs/Pleura: No suspicious appearing pulmonary nodules or masses
are noted. No acute consolidative airspace disease. No pleural
effusions.

Musculoskeletal/Soft Tissues: There are no aggressive appearing
lytic or blastic lesions noted in the visualized portions of the
skeleton.

CTA ABDOMEN AND PELVIS FINDINGS

Hepatobiliary: No suspicious cystic or solid hepatic lesions. No
intra or extrahepatic biliary ductal dilatation. Status post
cholecystectomy.

Pancreas: No pancreatic mass. No pancreatic ductal dilatation. No
pancreatic or peripancreatic fluid collections or inflammatory
changes.

Spleen: Unremarkable.

Adrenals/Urinary Tract: Bilateral kidneys and adrenal glands are
normal in appearance. No hydroureteronephrosis. Urinary bladder is
normal in appearance.

Stomach/Bowel: Normal appearance of the stomach. No pathologic
dilatation of small bowel or colon. Numerous colonic diverticulae
are noted, most severe in the descending colon and sigmoid colon,
without surrounding inflammatory changes to suggest an acute
diverticulitis at this time. Normal appendix.

Vascular/Lymphatic: Aortic atherosclerosis, without evidence of
aneurysm or dissection in the abdominal or pelvic vasculature.
Vascular findings and measurements pertinent to potential TAVR
procedure, as detailed below. No lymphadenopathy noted in the
abdomen or pelvis.

Reproductive: Status post hysterectomy. Ovaries are not confidently
identified and may be surgically absent or atrophic.

Other: Large umbilical hernia containing only omental fat. No
significant volume of ascites. No pneumoperitoneum.

Musculoskeletal: Chronic appearing compression fracture at L1 with
25% loss of anterior vertebral body height. There are no aggressive
appearing lytic or blastic lesions noted in the visualized portions
of the skeleton.

VASCULAR MEASUREMENTS PERTINENT TO TAVR:

AORTA:

Minimal Aortic Riameter-L7 x 12 mm

Severity of Aortic Calcification-severe

RIGHT PELVIS:

Right Common Iliac Artery -

Minimal 2iameter-3L.E x 9.1 mm

Tortuosity - mild

Calcification-mild

Right External Iliac Artery -

Minimal Kiameter-P.S x 6.7 mm

Tortuosity - mild

Calcification-none

Right Common Femoral Artery -

Minimal 2iameter-J.C x 7.1 mm

Tortuosity - mild

Calcification-none

LEFT PELVIS:

Left Common Iliac Artery -

Minimal 3iameter-Q4.Q x 9.9 mm

Tortuosity - mild

Calcification-mild

Left External Iliac Artery -

Minimal 7iameter-P.D x 6.6 mm

Tortuosity - mild

Calcification-none

Left Common Femoral Artery -

Minimal Viameter-S.T x 6.9 mm

Tortuosity-mild

Calcification-mild

Review of the MIP images confirms the above findings.
IMPRESSION: 1. Vascular findings and measurements pertinent to potential TAVR
procedure, as detailed above.
2. Severe thickening calcification of the aortic valve, compatible
with reported clinical history of severe aortic stenosis.
3. Aortic atherosclerosis, in addition to left main and 3 vessel
coronary artery disease.
4. Severe colonic diverticulosis without evidence of acute
diverticulitis at this time.
5. Additional incidental findings, as above.

## 2021-10-08 ENCOUNTER — Ambulatory Visit (INDEPENDENT_AMBULATORY_CARE_PROVIDER_SITE_OTHER): Payer: Medicare Other

## 2021-10-08 ENCOUNTER — Other Ambulatory Visit: Payer: Self-pay

## 2021-10-08 DIAGNOSIS — E1140 Type 2 diabetes mellitus with diabetic neuropathy, unspecified: Secondary | ICD-10-CM

## 2021-10-08 DIAGNOSIS — E1122 Type 2 diabetes mellitus with diabetic chronic kidney disease: Secondary | ICD-10-CM | POA: Diagnosis not present

## 2021-10-08 DIAGNOSIS — Z794 Long term (current) use of insulin: Secondary | ICD-10-CM | POA: Diagnosis not present

## 2021-10-08 DIAGNOSIS — Z0000 Encounter for general adult medical examination without abnormal findings: Secondary | ICD-10-CM

## 2021-10-08 DIAGNOSIS — E1169 Type 2 diabetes mellitus with other specified complication: Secondary | ICD-10-CM

## 2021-10-08 DIAGNOSIS — N1831 Chronic kidney disease, stage 3a: Secondary | ICD-10-CM | POA: Diagnosis not present

## 2021-10-08 DIAGNOSIS — E785 Hyperlipidemia, unspecified: Secondary | ICD-10-CM

## 2021-10-08 DIAGNOSIS — I10 Essential (primary) hypertension: Secondary | ICD-10-CM | POA: Diagnosis not present

## 2021-10-08 NOTE — Patient Instructions (Signed)
[name] , Thank you for taking time to come for your Medicare Wellness Visit. I appreciate your ongoing commitment to your health goals. Please review the following plan we discussed and let me know if I can assist you in the future.   Screening recommendations/referrals: Colonoscopy: 07/01/2012; due every 10 years Mammogram: 01/28/2020; due every 1-2 years Bone Density: never done Recommended yearly ophthalmology/optometry visit for glaucoma screening and checkup Recommended yearly dental visit for hygiene and checkup  Vaccinations: Influenza vaccine: 08/14/2021 Pneumococcal vaccine: 12/20/2014, 07/23/2016 Tdap vaccine: 08/27/2012; due every 10 years Shingles vaccine: never done   Covid-19: 02/03/2020, 02/29/2020, 07/13/2021  Advanced directives: Please bring a copy of your health care power of attorney and living will to the office at your convenience.  Conditions/risks identified: Yes; no goals for this year.  Next appointment: Please schedule your next Medicare Wellness Visit with your Nurse Health Advisor in 1 year by calling [phone].   Preventive Care 36 Years and Older, Female Preventive care refers to lifestyle choices and visits with your health care provider that can promote health and wellness. What does preventive care include? A yearly physical exam. This is also called an annual well check. Dental exams once or twice a year. Routine eye exams. Ask your health care provider how often you should have your eyes checked. Personal lifestyle choices, including: Daily care of your teeth and gums. Regular physical activity. Eating a healthy diet. Avoiding tobacco and drug use. Limiting alcohol use. Practicing safe sex. Taking low-dose aspirin every day. Taking vitamin and mineral supplements as recommended by your health care provider. What happens during an annual well check? The services and screenings done by your health care provider during your annual well check will  depend on your age, overall health, lifestyle risk factors, and family history of disease. Counseling  Your health care provider may ask you questions about your: Alcohol use. Tobacco use. Drug use. Emotional well-being. Home and relationship well-being. Sexual activity. Eating habits. History of falls. Memory and ability to understand (cognition). Work and work Astronomer. Reproductive health. Screening  You may have the following tests or measurements: Height, weight, and BMI. Blood pressure. Lipid and cholesterol levels. These may be checked every 5 years, or more frequently if you are over 75 years old. Skin check. Lung cancer screening. You may have this screening every year starting at age 59 if you have a 30-pack-year history of smoking and currently smoke or have quit within the past 15 years. Fecal occult blood test (FOBT) of the stool. You may have this test every year starting at age 75. Flexible sigmoidoscopy or colonoscopy. You may have a sigmoidoscopy every 5 years or a colonoscopy every 10 years starting at age 87. Hepatitis C blood test. Hepatitis B blood test. Sexually transmitted disease (STD) testing. Diabetes screening. This is done by checking your blood sugar (glucose) after you have not eaten for a while (fasting). You may have this done every 1-3 years. Bone density scan. This is done to screen for osteoporosis. You may have this done starting at age 34. Mammogram. This may be done every 1-2 years. Talk to your health care provider about how often you should have regular mammograms. Talk with your health care provider about your test results, treatment options, and if necessary, the need for more tests. Vaccines  Your health care provider may recommend certain vaccines, such as: Influenza vaccine. This is recommended every year. Tetanus, diphtheria, and acellular pertussis (Tdap, Td) vaccine. You may need a  Td booster every 10 years. Zoster vaccine. You may  need this after age 42. Pneumococcal 13-valent conjugate (PCV13) vaccine. One dose is recommended after age 68. Pneumococcal polysaccharide (PPSV23) vaccine. One dose is recommended after age 53. Talk to your health care provider about which screenings and vaccines you need and how often you need them. This information is not intended to replace advice given to you by your health care provider. Make sure you discuss any questions you have with your health care provider. Document Released: 12/22/2015 Document Revised: 08/14/2016 Document Reviewed: 09/26/2015 Elsevier Interactive Patient Education  2017 [place] Prevention in the Home Falls can cause injuries. They can happen to people of all ages. There are many things you can do to make your home safe and to help prevent falls. What can I do on the outside of my home? Regularly fix the edges of walkways and driveways and fix any cracks. Remove anything that might make you trip as you walk through a door, such as a raised step or threshold. Trim any bushes or trees on the path to your home. Use bright outdoor lighting. Clear any walking paths of anything that might make someone trip, such as rocks or tools. Regularly check to see if handrails are loose or broken. Make sure that both sides of any steps have handrails. Any raised decks and porches should have guardrails on the edges. Have any leaves, snow, or ice cleared regularly. Use sand or salt on walking paths during winter. Clean up any spills in your garage right away. This includes oil or grease spills. What can I do in the bathroom? Use night lights. Install grab bars by the toilet and in the tub and shower. Do not use towel bars as grab bars. Use non-skid mats or decals in the tub or shower. If you need to sit down in the shower, use a plastic, non-slip stool. Keep the floor dry. Clean up any water that spills on the floor as soon as it happens. Remove soap buildup in  the tub or shower regularly. Attach bath mats securely with double-sided non-slip rug tape. Do not have throw rugs and other things on the floor that can make you trip. What can I do in the bedroom? Use night lights. Make sure that you have a light by your bed that is easy to reach. Do not use any sheets or blankets that are too big for your bed. They should not hang down onto the floor. Have a firm chair that has side arms. You can use this for support while you get dressed. Do not have throw rugs and other things on the floor that can make you trip. What can I do in the kitchen? Clean up any spills right away. Avoid walking on wet floors. Keep items that you use a lot in easy-to-reach places. If you need to reach something above you, use a strong step stool that has a grab bar. Keep electrical cords out of the way. Do not use floor polish or wax that makes floors slippery. If you must use wax, use non-skid floor wax. Do not have throw rugs and other things on the floor that can make you trip. What can I do with my stairs? Do not leave any items on the stairs. Make sure that there are handrails on both sides of the stairs and use them. Fix handrails that are broken or loose. Make sure that handrails are as long as the stairways. Check any  carpeting to make sure that it is firmly attached to the stairs. Fix any carpet that is loose or worn. Avoid having throw rugs at the top or bottom of the stairs. If you do have throw rugs, attach them to the floor with carpet tape. Make sure that you have a light switch at the top of the stairs and the bottom of the stairs. If you do not have them, ask someone to add them for you. What else can I do to help prevent falls? Wear shoes that: Do not have high heels. Have rubber bottoms. Are comfortable and fit you well. Are closed at the toe. Do not wear sandals. If you use a stepladder: Make sure that it is fully opened. Do not climb a closed  stepladder. Make sure that both sides of the stepladder are locked into place. Ask someone to hold it for you, if possible. Clearly mark and make sure that you can see: Any grab bars or handrails. First and last steps. Where the edge of each step is. Use tools that help you move around (mobility aids) if they are needed. These include: Canes. Walkers. Scooters. Crutches. Turn on the lights when you go into a dark area. Replace any light bulbs as soon as they burn out. Set up your furniture so you have a clear path. Avoid moving your furniture around. If any of your floors are uneven, fix them. If there are any pets around you, be aware of where they are. Review your medicines with your doctor. Some medicines can make you feel dizzy. This can increase your chance of falling. Ask your doctor what other things that you can do to help prevent falls. This information is not intended to replace advice given to you by your health care provider. Make sure you discuss any questions you have with your health care provider. Document Released: 09/21/2009 Document Revised: 05/02/2016 Document Reviewed: 12/30/2014 Elsevier Interactive Patient Education  2017 [employer].

## 2021-10-08 NOTE — Progress Notes (Signed)
I connected with [name] today by telephone and verified that I am speaking with the correct person using two identifiers. Location patient: home Location provider: work Persons participating in the virtual visit: patient, provider.   I discussed the limitations, risks, security and privacy concerns of performing an evaluation and management service by telephone and the availability of in person appointments. I also discussed with the patient that there may be a patient responsible charge related to this service. The patient expressed understanding and verbally consented to this telephonic visit.    Interactive audio and video telecommunications were attempted between this provider and patient, however failed, due to patient having technical difficulties OR patient did not have access to video capability.  We continued and completed visit with audio only.  Some vital signs may be absent or patient reported.   Time Spent with patient on telephone encounter: 40 minutes  Subjective:   [name] is a 75 y.o. female who presents for Medicare Annual (Subsequent) preventive examination.  Review of Systems     Cardiac Risk Factors include: advanced age (>19men, >62 women);diabetes mellitus;dyslipidemia;family history of premature cardiovascular disease;hypertension     Objective:    There were no vitals filed for this visit. There is no height or weight on file to calculate BMI.  Advanced Directives 10/08/2021 07/09/2021 03/14/2021 10/30/2020 05/17/2020 05/17/2020 05/10/2020  Does Patient Have a Medical Advance Directive? Yes No No No No No No  Type of Advance Directive Living will;Healthcare Power of Attorney - - - - - -  Does patient want to make changes to medical advance directive? No - Patient declined - - - - - -  Copy of Healthcare Power of Attorney in Chart? No - copy requested - - - - - -  Would patient like information on creating a medical advance directive? - - No -  Patient declined - No - Patient declined No - Patient declined No - Guardian declined  Pre-existing out of facility DNR order (yellow form or pink MOST form) - - - - - - -    Current Medications (verified) Outpatient Encounter Medications as of 10/08/2021  Medication Sig   acetaminophen (TYLENOL) 325 MG tablet Take 325-650 mg by mouth daily as needed for mild pain or headache.   amoxicillin (AMOXIL) 500 MG tablet Take 4 tablets (2,000 mg total) by mouth as directed. 1 hour prior to dental work including cleanings (Patient taking differently: Take 2,000 mg by mouth See admin instructions. Take 4 tablets (2000 mg) by mouth 1 hour prior to dental work including cleanings)   ASPERCREME LIDOCAINE EX Apply 1 application topically 5 (five) times daily as needed (pain).   aspirin 81 MG tablet Take 81 mg by mouth daily.   atorvastatin (LIPITOR) 80 MG tablet Take 1 tablet (80 mg total) by mouth daily.   citalopram (CELEXA) 20 MG tablet Take 1 tablet (20 mg total) by mouth daily.   collagenase (SANTYL) ointment Apply 1 application topically 3 (three) times daily. Apply to sacrum   Continuous Blood Gluc Sensor (FREESTYLE LIBRE SENSOR SYSTEM) MISC Use to monitor sugars   esomeprazole (NEXIUM) 20 MG capsule Take 1 capsule (20 mg total) by mouth daily at 12 noon. (Patient taking differently: Take 20 mg by mouth every morning.)   ezetimibe (ZETIA) 10 MG tablet Take 1 tablet (10 mg total) by mouth daily.   gabapentin (NEURONTIN) 300 MG capsule Take 1 capsule (300 mg total) by mouth 2 (two) times daily.   glucose blood (  ONETOUCH VERIO) test strip USE AS DIRECTED   insulin NPH-regular Human (70-30) 100 UNIT/ML injection Inject 28 Units into the skin 2 (two) times daily with a meal.   Insulin Pen Needle 31G X 8 MM MISC Use to inject insulin four times daily E11.41   metFORMIN (GLUCOPHAGE) 500 MG tablet Take 1 tablet (500 mg total) by mouth 2 (two) times daily with a meal.   metoprolol tartrate (LOPRESSOR) 25 MG  tablet Take 0.5 tablets (12.5 mg total) by mouth 2 (two) times daily.   Naphazoline HCl (CLEAR EYES OP) Place 1 drop into both eyes daily as needed (dry eyes/itching).   nystatin (MYCOSTATIN/NYSTOP) powder Apply 1 application topically 3 (three) times daily.   Potassium Chloride ER 20 MEQ TBCR Take 20 mEq by mouth daily.   Semaglutide (RYBELSUS) 14 MG TABS Take 14 mg by mouth daily.   sodium chloride (OCEAN) 0.65 % SOLN nasal spray Place 1 spray into both nostrils 4 (four) times daily. (Patient taking differently: Place 1 spray into both nostrils 4 (four) times daily as needed for congestion.)   torsemide (DEMADEX) 20 MG tablet Take 1 tablet (20 mg total) by mouth daily.   No facility-administered encounter medications on file as of 10/08/2021.    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 Cyndia Bent and Burt Knack   Venous insufficiency    Ventral hernia    Past Surgical History:  Procedure Laterality Date   ABDOMINAL HYSTERECTOMY  2001   TAH-BSO   CHOLECYSTECTOMY  2001   COLONOSCOPY  2013   diverticulosis    ESOPHAGOGASTRODUODENOSCOPY  2013   normal    INCISIONAL HERNIA REPAIR     RIGHT/LEFT HEART CATH AND CORONARY ANGIOGRAPHY N/A 05/09/2020   Procedure: RIGHT/LEFT HEART CATH AND CORONARY ANGIOGRAPHY;  Surgeon: Jettie Booze, MD;  Location: [facility];  Service: Cardiovascular;  Laterality: N/A;   TEE WITHOUT CARDIOVERSION N/A 05/17/2020   Procedure: TRANSESOPHAGEAL ECHOCARDIOGRAM (TEE);  Surgeon: Sherren Mocha, MD;  Location: [facility];  Service: Open Heart Surgery;  Laterality: N/A;   TONSILLECTOMY     TRANSCATHETER AORTIC VALVE REPLACEMENT, TRANSFEMORAL N/A 05/17/2020   Procedure: TRANSCATHETER AORTIC VALVE REPLACEMENT, TRANSFEMORAL;  Surgeon: Sherren Mocha, MD;  Location: [facility];  Service: Open Heart Surgery;  Laterality: N/A;   Family History  Problem Relation Age of Onset   Heart failure Father        Died in 53s   CVA Father    Ovarian cancer Sister    Diabetes Sister    Other Sister        died at birth   Heart failure Mother        Died in 60s   Other Brother  drowned   Diabetes Paternal Grandmother    Breast cancer Paternal Aunt    Colon cancer Neg Hx    Social History   Socioeconomic History   Marital status: Divorced    Spouse name: Not on file   Number of children: 2   Years of education: Not on file   Highest education level: Not on file  Occupational History   Occupation: retired Administrator, Psychologist, sport and exercise, Therapist, 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 Patient needs to submit further paperwork to complete   [facility]  March 06, 2010 2:35 PM   Financial assistance approved for 100% discount at [facility] and has [facility] card   Bonna Gains  March 12, 2010 3:55 PM   Social Determinants of Health   Financial Resource Strain: Medium Risk   Difficulty  of Paying Living Expenses: Somewhat hard  Food Insecurity: Not on file  Transportation Needs: Unmet Transportation Needs   Lack of Transportation (Medical): Yes   Lack of Transportation (Non-Medical): Yes  Physical Activity: Not on file  Stress: Not on file  Social Connections: Not on file    Tobacco Counseling Counseling given: Not Answered   Clinical Intake:  Pre-visit preparation completed: Yes  Pain : No/denies pain     Nutritional Risks: None Diabetes: Yes CBG done?: No Did pt. bring in CBG monitor from home?: No  How often do you need to have someone help you when you read instructions, pamphlets, or other written materials from your doctor or pharmacy?: 1 - Never What is the last grade level you completed in school?: HSG; 2 years of college  Diabetic? yes  Interpreter Needed?: No  Information entered by :: Lisette Abu, LPN   Activities of Daily Living In your present state of health, do you have any difficulty performing the following activities: 10/08/2021 03/14/2021  Hearing? N N  Vision? N Y  Difficulty concentrating or making decisions? N Y  Walking or climbing stairs? Y Y  Dressing or bathing? N N  Doing errands, shopping? Tempie Donning  Preparing Food and eating ? N -  Using the Toilet? N -  In the past six months, have you accidently leaked urine? Y -  Do you have problems with loss of bowel control? N -  Managing your Medications? N -  Managing your Finances? N -  Housekeeping or managing your Housekeeping? N -  Some recent data might be hidden    Patient Care Team: Hoyt Koch, MD as PCP - General (Internal Medicine) Skeet Latch, MD as PCP - Cardiology (Cardiology) [place], Heather, [place] (Optometry) [place], Cleaster Corin, [facility] as Pharmacist (Pharmacist)  Indicate any recent Medical Services you may have received from other than [facility] providers in the past year (date may be approximate).     Assessment:   This is a routine wellness  examination for Chasitity.  Hearing/Vision screen Hearing Screening - Comments:: Patient denied any  hearing difficulty.   No hearing aids.  Vision Screening - Comments:: Patient wears corrective glasses/contacts.  Eye exam done annually by: Heather [place], OD.  Dietary issues and exercise activities discussed: Current Exercise Habits: Home exercise routine, Type of exercise: walking, Time (Minutes): 30, Frequency (Times/Week): 5, Weekly Exercise (Minutes/Week): 150, Intensity: Mild, Exercise limited by: orthopedic condition(s)   Goals Addressed   None   Depression Screen PHQ 2/9 Scores 10/08/2021 05/23/2020 06/28/2019 06/07/2019 10/21/2017 07/29/2016 10/09/2015  PHQ - 2 Score 0 0 6 2 1  0 0  PHQ- 9 Score - - 15 17 - - -    Fall Risk Fall Risk  10/08/2021 06/12/2021 05/23/2020 10/01/2019 10/01/2019  Falls in the past year? 0 0 1 0 0  Number falls in past yr: 0 0 1 - -  Injury with Fall? 1 0 1 - -  Comment - - - - -  Risk for fall due to : Impaired balance/gait - Impaired balance/gait - -  Follow up - - Education provided;Falls prevention discussed - -    FALL RISK PREVENTION PERTAINING TO THE HOME:  Any stairs in or around the home? No  If so, are there any without handrails? No  Home free of loose throw rugs in walkways, pet beds, electrical cords, etc? Yes  Adequate lighting in your home to reduce risk of falls? Yes   ASSISTIVE DEVICES UTILIZED TO PREVENT FALLS:  Life alert? Yes  Use of a cane, walker or w/c? Yes  Grab bars in the bathroom? Yes  Shower chair or bench in shower? Yes  Elevated toilet seat or a handicapped toilet? Yes   TIMED UP AND GO:  Was the test performed? No .  Length of time to ambulate 10 feet: n/a sec.   Gait slow and steady with assistive device (per patient)  Cognitive Function: Normal cognitive status assessed by direct observation by this Nurse Health Advisor. No abnormalities found.   MMSE - Mini Mental State Exam 07/29/2016  Orientation to time 5   Orientation to Place 5  Registration 3  Attention/ Calculation 5  Recall 2  Language- name 2 objects 2  Language- repeat 1  Language- follow 3 step command 3  Language- read & follow direction 1  Write a sentence 1  Copy design 1  Total score 29        Immunizations Immunization History  Administered Date(s) Administered   Fluad Quad(high Dose 65+) 08/30/2019, 09/09/2020, 08/14/2021   Influenza Split 08/27/2012   Influenza Whole 09/27/2005, 09/18/2007, 08/29/2009, 11/09/2010   Influenza, High Dose Seasonal PF 10/21/2017, 11/12/2018   Influenza,inj,Quad PF,6+ Mos 10/22/2013, 12/20/2014, 10/09/2015, 07/29/2016   Moderna SARS-COV2 Booster Vaccination 07/13/2021   PFIZER(Purple Top)SARS-COV-2 Vaccination 02/03/2020, 02/29/2020   Pneumococcal Conjugate-13 07/23/2016   Pneumococcal Polysaccharide-23 02/09/2013, 12/20/2014   Tdap 08/27/2012   Zoster, Live 02/19/2013    TDAP status: Up to date  Flu Vaccine status: Up to date  Pneumococcal vaccine status: Up to date  Covid-19 vaccine status: Completed vaccines  Qualifies for Shingles Vaccine? Yes   Zostavax completed Yes   Shingrix Completed?: No.    Education has been provided regarding the importance of this vaccine. Patient has been advised to call insurance company to determine out of pocket expense if they have not yet received this vaccine. Advised may also receive vaccine at local pharmacy or Health Dept. Verbalized acceptance and understanding.  Screening Tests Health Maintenance  Topic Date Due   Zoster Vaccines- Shingrix (1 of 2) Never done  OPHTHALMOLOGY EXAM  03/16/2021   FOOT EXAM  03/20/2021   URINE MICROALBUMIN  03/20/2021   COVID-19 Vaccine (3 - Booster for Pfizer series) 09/07/2021   HEMOGLOBIN A1C  09/12/2021   MAMMOGRAM  01/27/2022   LIPID PANEL  06/12/2022   COLONOSCOPY (Pts 45-43yrs Insurance coverage will need to be confirmed)  07/01/2022   TETANUS/TDAP  08/27/2022   Pneumonia Vaccine 65+ Years  old  Completed   INFLUENZA VACCINE  Completed   DEXA SCAN  Completed   Hepatitis C Screening  Completed   HPV VACCINES  Aged Out    Health Maintenance  Health Maintenance Due  Topic Date Due   Zoster Vaccines- Shingrix (1 of 2) Never done   OPHTHALMOLOGY EXAM  03/16/2021   FOOT EXAM  03/20/2021   URINE MICROALBUMIN  03/20/2021   COVID-19 Vaccine (3 - Booster for [place] series) 09/07/2021   HEMOGLOBIN A1C  09/12/2021    Colorectal cancer screening: No longer required.   Mammogram status: Completed 01/28/2020. Repeat every year  Lung Cancer Screening: (Low Dose CT Chest recommended if Age 43-80 years, 30 pack-year currently smoking OR have quit w/in 15years.) does not qualify.   Lung Cancer Screening Referral: no  Additional Screening:  Hepatitis C Screening: does qualify; Completed yes  Vision Screening: Recommended annual ophthalmology exams for early detection of glaucoma and other disorders of the eye. Is the patient up to date with their annual eye exam?  Yes  Who is the provider or what is the name of the office in which the patient attends annual eye exams? Heather [place], OD. If pt is not established with a provider, would they like to be referred to a provider to establish care? No .   Dental Screening: Recommended annual dental exams for proper oral hygiene  Community Resource Referral / Chronic Care Management: CRR required this visit?  No   CCM required this visit?  No      Plan:     I have personally reviewed and noted the following in the patient's chart:   Medical and social history Use of alcohol, tobacco or illicit drugs  Current medications and supplements including opioid prescriptions.  Functional ability and status Nutritional status Physical activity Advanced directives List of other physicians Hospitalizations, surgeries, and ER visits in previous 12 months Vitals Screenings to include cognitive, depression, and falls Referrals and  appointments  In addition, I have reviewed and discussed with patient certain preventive protocols, quality metrics, and best practice recommendations. A written personalized care plan for preventive services as well as general preventive health recommendations were provided to patient.     Sheral Flow, LPN   [id number]   Nurse Notes:  Patient is cogitatively intact. There were no vitals filed for this visit. There is no height or weight on file to calculate BMI.

## 2021-10-10 DIAGNOSIS — E1151 Type 2 diabetes mellitus with diabetic peripheral angiopathy without gangrene: Secondary | ICD-10-CM | POA: Diagnosis not present

## 2021-10-10 DIAGNOSIS — I872 Venous insufficiency (chronic) (peripheral): Secondary | ICD-10-CM | POA: Diagnosis not present

## 2021-10-10 DIAGNOSIS — Z9181 History of falling: Secondary | ICD-10-CM | POA: Diagnosis not present

## 2021-10-10 DIAGNOSIS — I5032 Chronic diastolic (congestive) heart failure: Secondary | ICD-10-CM | POA: Diagnosis not present

## 2021-10-10 DIAGNOSIS — I890 Lymphedema, not elsewhere classified: Secondary | ICD-10-CM | POA: Diagnosis not present

## 2021-10-10 DIAGNOSIS — E1140 Type 2 diabetes mellitus with diabetic neuropathy, unspecified: Secondary | ICD-10-CM | POA: Diagnosis not present

## 2021-10-15 DIAGNOSIS — I872 Venous insufficiency (chronic) (peripheral): Secondary | ICD-10-CM | POA: Diagnosis not present

## 2021-10-15 DIAGNOSIS — E1140 Type 2 diabetes mellitus with diabetic neuropathy, unspecified: Secondary | ICD-10-CM | POA: Diagnosis not present

## 2021-10-15 DIAGNOSIS — I890 Lymphedema, not elsewhere classified: Secondary | ICD-10-CM | POA: Diagnosis not present

## 2021-10-15 DIAGNOSIS — Z9181 History of falling: Secondary | ICD-10-CM | POA: Diagnosis not present

## 2021-10-15 DIAGNOSIS — I5032 Chronic diastolic (congestive) heart failure: Secondary | ICD-10-CM | POA: Diagnosis not present

## 2021-10-15 DIAGNOSIS — E1151 Type 2 diabetes mellitus with diabetic peripheral angiopathy without gangrene: Secondary | ICD-10-CM | POA: Diagnosis not present

## 2021-10-23 ENCOUNTER — Other Ambulatory Visit: Payer: Self-pay

## 2021-10-23 ENCOUNTER — Ambulatory Visit (INDEPENDENT_AMBULATORY_CARE_PROVIDER_SITE_OTHER): Payer: Medicare Other | Admitting: Pharmacist

## 2021-10-23 DIAGNOSIS — E1151 Type 2 diabetes mellitus with diabetic peripheral angiopathy without gangrene: Secondary | ICD-10-CM | POA: Diagnosis not present

## 2021-10-23 DIAGNOSIS — E785 Hyperlipidemia, unspecified: Secondary | ICD-10-CM

## 2021-10-23 DIAGNOSIS — E1140 Type 2 diabetes mellitus with diabetic neuropathy, unspecified: Secondary | ICD-10-CM | POA: Diagnosis not present

## 2021-10-23 DIAGNOSIS — I5032 Chronic diastolic (congestive) heart failure: Secondary | ICD-10-CM | POA: Diagnosis not present

## 2021-10-23 DIAGNOSIS — E1169 Type 2 diabetes mellitus with other specified complication: Secondary | ICD-10-CM

## 2021-10-23 DIAGNOSIS — I872 Venous insufficiency (chronic) (peripheral): Secondary | ICD-10-CM | POA: Diagnosis not present

## 2021-10-23 DIAGNOSIS — N1831 Chronic kidney disease, stage 3a: Secondary | ICD-10-CM

## 2021-10-23 DIAGNOSIS — Z9181 History of falling: Secondary | ICD-10-CM | POA: Diagnosis not present

## 2021-10-23 DIAGNOSIS — I10 Essential (primary) hypertension: Secondary | ICD-10-CM

## 2021-10-23 DIAGNOSIS — I890 Lymphedema, not elsewhere classified: Secondary | ICD-10-CM | POA: Diagnosis not present

## 2021-10-23 DIAGNOSIS — I252 Old myocardial infarction: Secondary | ICD-10-CM

## 2021-10-23 MED ORDER — INSULIN PEN NEEDLE 31G X 8 MM MISC: 3 refills | Status: DC

## 2021-10-23 MED ORDER — TRESIBA FLEXTOUCH 200 UNIT/ML ~~LOC~~ SOPN: 44.0000 [IU] | PEN_INJECTOR | Freq: Every day | SUBCUTANEOUS | 1 refills | Status: DC

## 2021-10-23 NOTE — Progress Notes (Signed)
Chronic Care Management Pharmacy Note  10/26/2021 Name:  [name] MRN:  [id number] DOB:  31-Mar-1946  Summary: -Pt reports Freestyle Elenor Legato is beeping "all the time"; counseled it will only beep when BG is too low, too high or sensor needs changing; she does not scan the device very often; BG was 77 on 11/14 AM and pt felt symptomatic; she recalls recent readings typically in low-200s -Pt has LIS so basal insulin will actually be cheaper than Novolin ([employer]) insulin;  -Pt is due for repeat A1c  Recommendations/Changes made from today's visit: -Stop Novolin 70/30. Start Tresiba 45 units daily at bedtime -Scheduled PCP f/u visit 12/12 for DM f/u and repeat A1c   Subjective: [name] is an 75 y.o. year old female who is a primary patient of Hoyt Koch, MD.  The CCM team was consulted for assistance with disease management and care coordination needs.    Engaged with patient by telephone for follow up visit in response to provider referral for pharmacy case management and/or care coordination services.   Consent to Services:  The patient was given information about Chronic Care Management services, agreed to services, and gave verbal consent prior to initiation of services.  Please see initial visit note for detailed documentation.   Patient Care Team: Hoyt Koch, MD as PCP - General (Internal Medicine) Skeet Latch, MD as PCP - Cardiology (Cardiology) [place], [place], [place] (Optometry) [place], [place], [facility] as Pharmacist (Pharmacist)  Patient is from [place] but moved away after HS. She moved back to [place] when her sister passed at age 41. Her husband left her around the same time. She does not have any family close by and lives alone. She does not drive, she uses Medicare transportation service to get to doctor's appts however she has not had good experiences with them and has missed appts because of it.   Recent office  visits: 08/14/21 Dr Sharlet Salina OV: hospital/rehab f/u (fell in bathtub x 3-4 days); doing PT; no med changes.   06/12/21- Dr. Sharlet Salina- PCP. Follow up. Increased Rybelsus from 7 mg to 14 mg daily. Increased torsemide to 1-2 tablets daily. Follow up in 3-6 months.   03/22/21- Dr. Sharlet Salina- PCP. Hospital follow up. Increased Rybelsus from 3 mg to 7 mg. Follow up in 2-3 months.    02/22/21- Dr. Sharlet Salina- PCP- DM follow up. Increased Lipitor to 80 mg daily to take along with Zetia. Increased lasix to 80 mg to help with swelling and wound. Started Rybelsus 3 mg. Restarted metoprolol 1/2 tablet daily. Follow up in 4 weeks.   05/04/21- Dr. Sharlet Salina- PCP- Video Visit- Bilateral lower extremity cellulitis. Started doxycycline. Nystatin powder and potassium refilled. Follow up PRN.  05/26/20 Dr Sharlet Salina OV: hospital f/u (NSTEMI w/ stents, s/p TAVR). Metformin contributes to diarrhea. Off ARB due to AKI.  Recent consult visits: 09/25/21 telephone - home health identified UTI. PCP sent Macrobic x 7 days  06/07/21- Angelena Form, PA-C- Cardiology- Follow up on transcatheter aortic valve replacement. Amoxicillin provided for future dental appointments.  03/14/21- Jeri Cos, PA-C- Wound care bilateral calf ulcers. Referred to ED.    03/07/21- Jeri Cos, PA-C- Wound care bilateral calf ulcers.  09/25/20 Dr Oval Linsey (cardiology): f/u aortic stenosis s/p TAVR, venous insufficiency. Hx statin intolerance (tried 3). Continue Plavix through 12/9 then continue aspirin alone.   06/15/20 PA Angelena Form (cardiology): s/p TAVR. Worsening LE edema. Increase furosemide to 40 mg daily.  Hospital visits: Medication Reconciliation was completed by comparing discharge  summary, patient's EMR and Pharmacy list, and upon discussion with patient.   Admitted to the hospital on 07/09/21 due to fall at home. Discharge date was 07/13/21. Discharged from [place]?Medications Started at [facility] Discharge:?? -No new  medications   Medication Changes at Hospital Discharge: -Changed insulin 70/30 from 45 units twice daily to 30 unit twice daily. Decreased gabapentin from TID to BID.    Medications Discontinued at Hospital Discharge: -No medications discontinued   Medications that remain the same after Hospital Discharge:??  -All other medications will remain the same.     Admitted to the hospital on 03/14/21 due to cellulitis in foot. Discharge date was 03/18/21. Discharged from [facility]?Medications Started at [facility] Discharge:?? -started Doxycycline due to cellulitis, potassium and torsemide.    Medication Changes at Hospital Discharge: -Changed Atorvastatin to 40 mg daily from 80 mg.    Medications Discontinued at Hospital Discharge: -Stopped Furosemide due to switching to torsemide   Medications that remain the same after Hospital Discharge:??  -All other medications will remain the same.    Objective:  Lab Results  Component Value Date   CREATININE 0.67 07/13/2021   BUN 17 07/13/2021   GFR 52.42 (L) 06/12/2021   GFRNONAA >60 07/13/2021   GFRAA 99 06/30/2020   NA 136 07/13/2021   K 4.3 07/13/2021   CALCIUM 8.4 (L) 07/13/2021   CO2 29 07/13/2021    Lab Results  Component Value Date/Time   HGBA1C 9.8 (H) 06/12/2021 02:36 PM   HGBA1C 12.6 (H) 03/14/2021 05:06 PM   HGBA1C 11.2 03/20/2020 11:40 AM   HGBA1C 11.2 (A) 03/20/2020 11:40 AM   HGBA1C 11.2 (A) 03/20/2020 11:40 AM   GFR 52.42 (L) 06/12/2021 02:36 PM   GFR 86.05 02/22/2021 03:36 PM   MICROALBUR 20.4 (H) 03/20/2020 11:57 AM   MICROALBUR 12.1 (H) 05/10/2019 10:50 AM    Last diabetic Eye exam:  Lab Results  Component Value Date/Time   HMDIABEYEEXA Retinopathy (A) 03/16/2020 11:43 AM    Last diabetic Foot exam:  Lab Results  Component Value Date/Time   HMDIABFOOTEX Done 11/09/2010 12:00 AM     Lab Results  Component Value Date   CHOL 180 06/12/2021   HDL 44.60 06/12/2021   LDLCALC 99 06/12/2021    LDLDIRECT 110.0 11/12/2018   TRIG 180.0 (H) 06/12/2021   CHOLHDL 4 06/12/2021    Hepatic Function Latest Ref Rng & Units 07/10/2021 07/09/2021 06/12/2021  Total Protein 6.5 - 8.1 g/dL 5.7(L) 6.3(L) 7.6  Albumin 3.5 - 5.0 g/dL 2.2(L) 2.4(L) 3.8  AST 15 - 41 U/L '24 22 25  ' ALT 0 - 44 U/L 19 21 45(H)  Alk Phosphatase 38 - 126 U/L 124 128(H) 179(H)  Total Bilirubin 0.3 - 1.2 mg/dL 0.7 2.0(H) 0.4    Lab Results  Component Value Date/Time   TSH 4.436 07/09/2021 09:02 PM   TSH 4.000 05/14/2020 04:45 AM   TSH 2.15 08/08/2015 10:56 AM   TSH 1.306 12/19/2014 06:27 PM    CBC Latest Ref Rng & Units 07/11/2021 07/10/2021 07/09/2021  WBC 4.0 - 10.5 K/uL 9.2 10.2 13.2(H)  Hemoglobin 12.0 - 15.0 g/dL 12.4 13.0 13.9  Hematocrit 36.0 - 46.0 % 38.1 38.6 42.0  Platelets 150 - 400 K/uL 189 217 216    No results found for: VD25OH  Clinical ASCVD: Yes  The ASCVD Risk score (Arnett DK, et al., 2019) failed to calculate for the following reasons:  The patient has a prior MI or stroke diagnosis    Depression screen [facility] 2/9 10/08/2021 05/23/2020 06/28/2019  Decreased Interest 0 0 3  Down, Depressed, Hopeless 0 0 3  PHQ - 2 Score 0 0 6  Altered sleeping - - 3  Tired, decreased energy - - 3  Change in appetite - - 1  Feeling bad or failure about yourself  - - 2  Trouble concentrating - - 0  Moving slowly or fidgety/restless - - 0  Suicidal thoughts - - 0  PHQ-9 Score - - 15  Some recent data might be hidden     Social History   Tobacco Use  Smoking Status Never  Smokeless Tobacco Never   BP Readings from Last 3 Encounters:  08/14/21 132/68  07/13/21 (!) 115/53  06/12/21 136/68   Pulse Readings from Last 3 Encounters:  08/14/21 70  07/13/21 75  06/12/21 (!) 54   Wt Readings from Last 3 Encounters:  08/14/21 172 lb 6.4 oz (78.2 kg)  07/09/21 188 lb (85.3 kg)  06/12/21 183 lb 12.8 oz (83.4 kg)   BMI Readings from Last 3 Encounters:  08/14/21 29.59 kg/m  07/09/21 32.27 kg/m  06/12/21  31.55 kg/m    Assessment/Interventions: Review of patient past medical history, allergies, medications, health status, including review of consultants reports, laboratory and other test data, was performed as part of comprehensive evaluation and provision of chronic care management services.   SDOH:  (Social Determinants of Health) assessments and interventions performed: Yes   SDOH Screenings   Alcohol Screen: Not on file  Depression (PHQ2-9): Low Risk    PHQ-2 Score: 0  Financial Resource Strain: Medium Risk   Difficulty of Paying Living Expenses: Somewhat hard  Food Insecurity: Not on file  Housing: Not on file  Physical Activity: Not on file  Social Connections: Not on file  Stress: Not on file  Tobacco Use: Low Risk    Smoking Tobacco Use: Never   Smokeless Tobacco Use: Never   Passive Exposure: Not on file  Transportation Needs: Unmet Transportation Needs   Lack of Transportation (Medical): Yes   Lack of Transportation (Non-Medical): Yes   CCM Care Plan  Allergies  Allergen Reactions   Morphine Other (See Comments)    'took me out of this world' I had to be resuscitated   Codeine Sulfate Nausea Only and Other (See Comments)    GI upset and pain   Demerol [Meperidine] Nausea And Vomiting   Propoxyphene Hcl Other (See Comments)    Darvocet caused sick headache    Medications Reviewed Today     Reviewed by Sheral Flow, LPN (Licensed Practical Nurse) on 10/08/21 at [place] List Status: <None>   Medication Order Taking? Sig Documenting Provider Last Dose Status Informant  acetaminophen (TYLENOL) 325 MG tablet [id number] No Take 325-650 mg by mouth daily as needed for mild pain or headache. [provider] Taking Active Multiple Informants  amoxicillin (AMOXIL) 500 MG tablet [id number] No Take 4 tablets (2,000 mg total) by mouth as directed. 1 hour prior to dental work including cleanings  Patient taking differently: Take 2,000 mg by mouth See admin  instructions. Take 4 tablets (2000 mg) by mouth 1 hour prior to dental work including cleanings   Crista Luria Taking Active   ASPERCREME LIDOCAINE EX [id number] No Apply 1 application topically 5 (five) times daily as needed (pain). [provider] Taking Active Multiple Informants  aspirin 81 MG tablet [id number] No  Take 81 mg by mouth daily. [provider] Taking Active Multiple Informants  atorvastatin (LIPITOR) 80 MG tablet [id number] No Take 1 tablet (80 mg total) by mouth daily. Hoyt Koch, MD Taking Active   citalopram (CELEXA) 20 MG tablet [id number] No Take 1 tablet (20 mg total) by mouth daily. Hoyt Koch, MD Taking Active   collagenase Annitta Needs) ointment [id number] No Apply 1 application topically 3 (three) times daily. Apply to sacrum [provider] Taking Active   Continuous Blood Gluc Sensor ([place]) [place] [id number] No Use to monitor sugars Hoyt Koch, MD Taking Active   esomeprazole (NEXIUM) 20 MG capsule [id number] No Take 1 capsule (20 mg total) by mouth daily at 12 noon.  Patient taking differently: Take 20 mg by mouth every morning.   Hoyt Koch, MD Taking Active            Med Note Rosine Beat Jul 09, 2021  8:34 PM) OTC  ezetimibe (ZETIA) 10 MG tablet [id number] No Take 1 tablet (10 mg total) by mouth daily. Hoyt Koch, MD Taking Active   gabapentin (NEURONTIN) 300 MG capsule [id number] No Take 1 capsule (300 mg total) by mouth 2 (two) times daily. Domenic Polite, MD Taking Active   glucose blood [facility] VERIO) test strip [id number] No USE AS DIRECTED Hoyt Koch, MD Taking Active Multiple Informants  insulin NPH-regular Human (70-30) 100 UNIT/ML injection [id number] No Inject 28 Units into the skin 2 (two) times daily with a meal. Domenic Polite, MD Taking Active   Insulin Pen Needle 31G X 8 MM MISC [id number] No Use to inject insulin four times  daily E11.41 Hoyt Koch, MD Taking Active Multiple Informants  metFORMIN (GLUCOPHAGE) 500 MG tablet [id number] No Take 1 tablet (500 mg total) by mouth 2 (two) times daily with a meal. Hoyt Koch, MD Taking Active   metoprolol tartrate (LOPRESSOR) 25 MG tablet [id number] No Take 0.5 tablets (12.5 mg total) by mouth 2 (two) times daily. Hoyt Koch, MD Taking Active   Naphazoline HCl (CLEAR EYES OP) [id number] No Place 1 drop into both eyes daily as needed (dry eyes/itching). [provider] Taking Active Multiple Informants  nystatin (MYCOSTATIN/NYSTOP) powder [id number] No Apply 1 application topically 3 (three) times daily. Hoyt Koch, MD Taking Active Multiple Informants  Potassium Chloride ER 20 MEQ TBCR [id number] No Take 20 mEq by mouth daily. Hoyt Koch, MD Taking Active   Semaglutide [facility]) 14 MG TABS [id number] No Take 14 mg by mouth daily. Hoyt Koch, MD Taking Active   sodium chloride (OCEAN) 0.65 % SOLN nasal spray [id number] No Place 1 spray into both nostrils 4 (four) times daily.  Patient taking differently: Place 1 spray into both nostrils 4 (four) times daily as needed for congestion.   Hoyt Koch, MD Taking Active   torsemide (DEMADEX) 20 MG tablet [id number] No Take 1 tablet (20 mg total) by mouth daily. 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   Near syncope 05/11/2020   Pressure injury of skin 05/11/2020   Orthostatic hypotension 05/11/2020   Dental caries 05/11/2020   Aortic stenosis    Diabetic neuropathy ([place]) 11/13/2018   Left knee pain 10/24/2017   Greater trochanteric bursitis of  left hip 10/24/2017   Atypical chest pain 10/10/2015   Reactive airway disease 04/09/2014   Seasonal allergic rhinitis 04/09/2014   Preventative health  care 04/09/2014   Abnormality of gait 03/17/2013   Irritable bowel syndrome (IBS) 08/27/2012   Shoulder pain 09/25/2009   Type II diabetes mellitus with neurological manifestations, uncontrolled 06/18/2007   GERD 06/18/2007   Hyperlipidemia associated with type 2 diabetes mellitus ([place]) 12/27/2006   Essential hypertension 09/19/2006   Venous (peripheral) insufficiency 09/19/2006    Immunization History  Administered Date(s) Administered   Fluad Quad(high Dose 65+) 08/30/2019, 09/09/2020, 08/14/2021   Influenza Split 08/27/2012   Influenza Whole 09/27/2005, 09/18/2007, 08/29/2009, 11/09/2010   Influenza, High Dose Seasonal PF 10/21/2017, 11/12/2018   Influenza,inj,Quad PF,6+ Mos 10/22/2013, 12/20/2014, 10/09/2015, 07/29/2016   Moderna SARS-COV2 Booster Vaccination 07/13/2021   PFIZER(Purple Top)SARS-COV-2 Vaccination 02/03/2020, 02/29/2020   Pneumococcal Conjugate-13 07/23/2016   Pneumococcal Polysaccharide-23 02/09/2013, 12/20/2014   Tdap 08/27/2012   Zoster, Live 02/19/2013    Conditions to be addressed/monitored:  Hypertension, Hyperlipidemia, Diabetes, and Coronary Artery Disease  Care Plan : [place]  Updates made by Charlton Haws, [place] since 10/26/2021 12:00 AM     Problem: Hypertension, Hyperlipidemia, Diabetes, and Coronary Artery Disease   Priority: High     Long-Range Goal: Disease management   Start Date: 01/19/2021  Expected End Date: 08/27/2022  This Visit's Progress: On track  Recent Progress: Not on track  Priority: High  Note:   Current Barriers:  Unable to independently monitor therapeutic efficacy Suboptimal therapeutic regimen for diabetes Does not adhere to prescribed medication regimen Does not maintain contact with provider office  Pharmacist Clinical Goal(s):  Patient will achieve adherence to monitoring guidelines and medication adherence to achieve therapeutic efficacy adhere to plan to optimize therapeutic regimen for  diabetes as evidenced by report of adherence to recommended medication management changes through collaboration with PharmD and provider.   Interventions: 1:1 collaboration with Hoyt Koch, MD regarding development and update of comprehensive plan of care as evidenced by provider attestation and co-signature Inter-disciplinary care team collaboration (see longitudinal plan of care) Comprehensive medication review performed; medication list updated in electronic medical record  Heart Failure / Hypertension (Goal: BP < 140/90, and prevent exacerbations) -Improving - pt does not endorse swelling problems currently; BP was at goal in recent office visit -Pt is not checking BP at home - monitor is broken -HF Type: Diastolic (grade 2 dysfunction) -Ejection fraction: 60-65% (Date: 06/15/20) -Current treatment Metoprolol tartrate 25 mg - 1/2 tab BID  Torsemide 20 mg daily - takes 1 tab daily, extra tab PRN Potassium 20 mEq daily  -Medications previously tried: lisinopril (stopped during hospitalization for AKI, never restarted) -Counseld on importance of potassium supplement while taking torsemide -Recommend to continue current medication  Hyperlipidemia: (LDL goal < 70) -Uncontrolled - LDL is above goal 06/2021; adherence to statin is not clear, pt does not recognized drug name; verified with mail order atorvastatin was filled 9/19 x 90 ds;  -Current treatment: Atorvastatin 80 mg daily - unclear if taking or not Ezetimibe 10 mg daily Aspirin 81 mg daily -Medications previously tried: pravastatin  -Educated on Cholesterol goals; Benefits of statin for ASCVD risk reduction; -Counseled on statin adherence -Recommend to continue current medication  Diabetes (A1c goal <8%) -Improving - A1c improved from >12% to 9.8% after starting Rybelsus; she endorses compliance with medication and insulin but has previously struggled with adherence; she reports she is wearing Freestyle Libre sensor and  reports  it "beeps all the time" and she does not scan it very often; -Pt reports taking insulin with breakfast (usually around 11-12pm), then again around dinner; she cannot say how often she misses insulin doses but irregular meal schedule makes it difficult to schedule insulin with meals;  -Home BG readings: 77 (11/14 AM); usually 200-220;  -Current medications: Metformin 500 mg BID Relion Novolin 70/30 27 units twice day ($25/vial at [place], pt requires 3 vials/month) Rybelsus 14 mg daily - takes it after eating Freestyle Libre 2 Relion glucometer and test strips -Medications previously tried: Engineer, agricultural, Novolog -Counseled on hypoglycemia prevention and management -Advised pt to scan [place] sensor before breakfast, whenever it beeps, and when she feels shaky/clammy; counseled pt that device will only beep when BG is <70 or > 250, so her BG is probably out of range often -Discussed Novolin 70/30 peaks 4-6 hours after taking it, leaving her susceiptible to low sugars; a basal insulin with 24-hr coverage would be better for her for once daily dosing and does not have to be dosed with meals; pt has LIS so basal insulin should actually be cheaper than OTC Relion; it is not clear how often she is actually taking full 60 units/day so will conservatively reduce dose of basal insulin and titrate up as needed -Plan: stop Novolin 70/30; change to [name] 45 units daily at bedtime  Patient Goals/Self-Care Activities Patient will:  - take medications as prescribed -focus on medication adherence by pill box -check glucose via Freestyle Libre 2 - keep reader in pocket at all times -Call [place] to refill [employer] sensors -Stop Novolin 70/30. Start Tresiba 45 units at bedtime.     Compliance/Adherence/Medication fill history: Care Gaps: Foot exam - 03/20/21 Eye exam - 03/16/21 Urine microalbumin - 03/20/21 A1c (due 09/12/21)  Star-Rating Drugs: Atorvastatin - LF 08/27/21 x 90 ds Rybelsus - LF  06/21/21 x 90 ds Metformin - LF 08/01/21 x 90 ds   Medication Assistance: None required.  Patient affirms current coverage meets needs. She is enrolled in LIS.  Patient's preferred pharmacy is:  [facility] [place] [address] Phone: [phone] Fax: [phone]  [facility] - [place], [place] - [address] [place] [address] Phone: [phone] Fax: [phone]  Uses pill box? Yes - monthly box Pt endorses 100% compliance  We discussed: Reviewed patient's [facility] medication and Epic medication profile assuring there are no discrepancies or gaps in therapy. Confirmed all fill dates appropriate and verified with patient that there is a sufficient quantity of all prescribed medications at home. Informed patient to call me any time if needing medications before scheduled deliveries.  Patient decided to: Utilize [facility] pharmacy for medication synchronization, packaging and delivery  Care Plan and Follow Up Patient Decision:  Patient agrees to Care Plan and Follow-up.  Plan: Telephone follow up appointment with care management team member scheduled for:  1 month  Charlene Brooke, PharmD, [place], CPP Clinical Pharmacist Practitioner [facility] [phone]

## 2021-10-25 ENCOUNTER — Telehealth: Payer: Medicare Other

## 2021-10-26 NOTE — Patient Instructions (Signed)
Visit Information  Phone number for Pharmacist: [phone]   Goals Addressed             This Visit's Progress    Monitor and Manage My Blood Sugar-Diabetes Type 2       Timeframe:  Long-Range Goal Priority:  High Start Date:         01/19/21                    Expected End Date:    08/27/22            Follow Up Date Nov 2022   - check blood sugar at prescribed times - check blood sugar if I feel it is too high or too low - enter blood sugar readings and medication or insulin into daily log - take the blood sugar log to all doctor visits  -Keep [facility] reader in pocket at all times -Call [facility] [phone]) for [facility] sensor refills -STOP Novolin 70/30. Start Tresiba 45 units daily   Why is this important?   Checking your blood sugar at home helps to keep it from getting very high or very low.  Writing the results in a diary or log helps the doctor know how to care for you.  Your blood sugar log should have the time, date and the results.  Also, write down the amount of insulin or other medicine that you take.  Other information, like what you ate, exercise done and how you were feeling, will also be helpful.           Care Plan : [place]  Updates made by Charlton Haws, RPH since 10/26/2021 12:00 AM     Problem: Hypertension, Hyperlipidemia, Diabetes, and Coronary Artery Disease   Priority: High     Long-Range Goal: Disease management   Start Date: 01/19/2021  Expected End Date: 08/27/2022  This Visit's Progress: On track  Recent Progress: Not on track  Priority: High  Note:   Current Barriers:  Unable to independently monitor therapeutic efficacy Suboptimal therapeutic regimen for diabetes Does not adhere to prescribed medication regimen Does not maintain contact with provider office  Pharmacist Clinical Goal(s):  Patient will achieve adherence to monitoring guidelines and medication adherence to achieve therapeutic  efficacy adhere to plan to optimize therapeutic regimen for diabetes as evidenced by report of adherence to recommended medication management changes through collaboration with PharmD and provider.   Interventions: 1:1 collaboration with Hoyt Koch, MD regarding development and update of comprehensive plan of care as evidenced by provider attestation and co-signature Inter-disciplinary care team collaboration (see longitudinal plan of care) Comprehensive medication review performed; medication list updated in electronic medical record  Heart Failure / Hypertension (Goal: BP < 140/90, and prevent exacerbations) -Improving - pt does not endorse swelling problems currently; BP was at goal in recent office visit -Pt is not checking BP at home - monitor is broken -HF Type: Diastolic (grade 2 dysfunction) -Ejection fraction: 60-65% (Date: 06/15/20) -Current treatment Metoprolol tartrate 25 mg - 1/2 tab BID  Torsemide 20 mg daily - takes 1 tab daily, extra tab PRN Potassium 20 mEq daily  -Medications previously tried: lisinopril (stopped during hospitalization for AKI, never restarted) -Counseld on importance of potassium supplement while taking torsemide -Recommend to continue current medication  Hyperlipidemia: (LDL goal < 70) -Uncontrolled - LDL is above goal 06/2021; adherence to statin is not clear, pt does not recognized drug name; verified with mail order atorvastatin was  filled 9/19 x 90 ds;  -Hx of NSTEMI -Current treatment: Atorvastatin 80 mg daily - unclear if taking or not Ezetimibe 10 mg daily Aspirin 81 mg daily -Medications previously tried: pravastatin  -Educated on Cholesterol goals; Benefits of statin for ASCVD risk reduction; -Counseled on statin adherence -Recommend to continue current medication  Diabetes (A1c goal <8%) -Improving - A1c improved from >12% to 9.8% after starting Rybelsus; she endorses compliance with medication and insulin but has previously  struggled with adherence; she reports she is wearing Freestyle Libre sensor and reports it "beeps all the time" and she does not scan it very often; -Pt reports taking insulin with breakfast (usually around 11-12pm), then again around dinner; she cannot say how often she misses insulin doses but irregular meal schedule makes it difficult to schedule insulin with meals;  -Home BG readings: 77 (11/14 AM); usually 200-220;  -Current medications: Metformin 500 mg BID Relion Novolin 70/30 27 units twice day ($25/vial at [place], pt requires 3 vials/month) Rybelsus 14 mg daily - takes it after eating Freestyle Libre 2 Relion glucometer and test strips -Medications previously tried: Hospital doctor, Novolog -Counseled on hypoglycemia prevention and management -Advised pt to scan [employer] sensor before breakfast, whenever it beeps, and when she feels shaky/clammy; counseled pt that device will only beep when BG is <70 or > 250, so her BG is probably out of range often -Discussed Novolin 70/30 peaks 4-6 hours after taking it, leaving her susceiptible to low sugars; a basal insulin with 24-hr coverage would be better for her for once daily dosing and does not have to be dosed with meals; pt has LIS so basal insulin should actually be cheaper than OTC Relion; it is not clear how often she is actually taking full 60 units/day so will conservatively reduce dose of basal insulin and titrate up as needed -Pt is due for repeat A1c as well; scheduled PCP f/u visit for 12/12 -Plan: stop Novolin 70/30; change to [place] 45 units daily at bedtime  Patient Goals/Self-Care Activities Patient will:  - take medications as prescribed -focus on medication adherence by pill box -check glucose via Freestyle Libre 2 - keep reader in pocket at all times -Call [facility] to refill [employer] sensors -Stop Novolin 70/30. Start Tresiba 45 units at bedtime.      Patient verbalizes understanding of instructions provided today and  agrees to view in MyChart.  Telephone follow up appointment with pharmacy team member scheduled for: 1 month  Al Corpus, PharmD, [place], CPP Clinical Pharmacist Practitioner [facility] [phone]

## 2021-10-28 ENCOUNTER — Other Ambulatory Visit: Payer: Self-pay | Admitting: Internal Medicine

## 2021-10-28 DIAGNOSIS — E785 Hyperlipidemia, unspecified: Secondary | ICD-10-CM

## 2021-10-28 DIAGNOSIS — E1169 Type 2 diabetes mellitus with other specified complication: Secondary | ICD-10-CM

## 2021-10-29 DIAGNOSIS — E1140 Type 2 diabetes mellitus with diabetic neuropathy, unspecified: Secondary | ICD-10-CM | POA: Diagnosis not present

## 2021-10-29 DIAGNOSIS — E1151 Type 2 diabetes mellitus with diabetic peripheral angiopathy without gangrene: Secondary | ICD-10-CM | POA: Diagnosis not present

## 2021-10-29 DIAGNOSIS — I872 Venous insufficiency (chronic) (peripheral): Secondary | ICD-10-CM | POA: Diagnosis not present

## 2021-10-29 DIAGNOSIS — I890 Lymphedema, not elsewhere classified: Secondary | ICD-10-CM | POA: Diagnosis not present

## 2021-10-29 DIAGNOSIS — Z9181 History of falling: Secondary | ICD-10-CM | POA: Diagnosis not present

## 2021-10-29 DIAGNOSIS — I5032 Chronic diastolic (congestive) heart failure: Secondary | ICD-10-CM | POA: Diagnosis not present

## 2021-11-06 DIAGNOSIS — E1140 Type 2 diabetes mellitus with diabetic neuropathy, unspecified: Secondary | ICD-10-CM | POA: Diagnosis not present

## 2021-11-06 DIAGNOSIS — I872 Venous insufficiency (chronic) (peripheral): Secondary | ICD-10-CM | POA: Diagnosis not present

## 2021-11-06 DIAGNOSIS — Z9181 History of falling: Secondary | ICD-10-CM | POA: Diagnosis not present

## 2021-11-06 DIAGNOSIS — I890 Lymphedema, not elsewhere classified: Secondary | ICD-10-CM | POA: Diagnosis not present

## 2021-11-06 DIAGNOSIS — E1151 Type 2 diabetes mellitus with diabetic peripheral angiopathy without gangrene: Secondary | ICD-10-CM | POA: Diagnosis not present

## 2021-11-06 DIAGNOSIS — I5032 Chronic diastolic (congestive) heart failure: Secondary | ICD-10-CM | POA: Diagnosis not present

## 2021-11-07 DIAGNOSIS — E1122 Type 2 diabetes mellitus with diabetic chronic kidney disease: Secondary | ICD-10-CM

## 2021-11-07 DIAGNOSIS — I252 Old myocardial infarction: Secondary | ICD-10-CM | POA: Diagnosis not present

## 2021-11-07 DIAGNOSIS — I10 Essential (primary) hypertension: Secondary | ICD-10-CM

## 2021-11-07 DIAGNOSIS — N1831 Chronic kidney disease, stage 3a: Secondary | ICD-10-CM

## 2021-11-07 DIAGNOSIS — Z794 Long term (current) use of insulin: Secondary | ICD-10-CM

## 2021-11-07 DIAGNOSIS — E785 Hyperlipidemia, unspecified: Secondary | ICD-10-CM

## 2021-11-07 DIAGNOSIS — E1169 Type 2 diabetes mellitus with other specified complication: Secondary | ICD-10-CM

## 2021-11-13 DIAGNOSIS — I872 Venous insufficiency (chronic) (peripheral): Secondary | ICD-10-CM | POA: Diagnosis not present

## 2021-11-13 DIAGNOSIS — E1151 Type 2 diabetes mellitus with diabetic peripheral angiopathy without gangrene: Secondary | ICD-10-CM | POA: Diagnosis not present

## 2021-11-13 DIAGNOSIS — E1140 Type 2 diabetes mellitus with diabetic neuropathy, unspecified: Secondary | ICD-10-CM | POA: Diagnosis not present

## 2021-11-13 DIAGNOSIS — I5032 Chronic diastolic (congestive) heart failure: Secondary | ICD-10-CM | POA: Diagnosis not present

## 2021-11-13 DIAGNOSIS — I890 Lymphedema, not elsewhere classified: Secondary | ICD-10-CM | POA: Diagnosis not present

## 2021-11-13 DIAGNOSIS — Z9181 History of falling: Secondary | ICD-10-CM | POA: Diagnosis not present

## 2021-11-19 ENCOUNTER — Ambulatory Visit (INDEPENDENT_AMBULATORY_CARE_PROVIDER_SITE_OTHER): Payer: Medicare Other | Admitting: Internal Medicine

## 2021-11-19 ENCOUNTER — Other Ambulatory Visit: Payer: Self-pay

## 2021-11-19 ENCOUNTER — Encounter: Payer: Self-pay | Admitting: Internal Medicine

## 2021-11-19 VITALS — BP 128/84 | HR 79 | Resp 18 | Ht 64.0 in | Wt 183.2 lb

## 2021-11-19 DIAGNOSIS — E1142 Type 2 diabetes mellitus with diabetic polyneuropathy: Secondary | ICD-10-CM

## 2021-11-19 DIAGNOSIS — F3341 Major depressive disorder, recurrent, in partial remission: Secondary | ICD-10-CM | POA: Diagnosis not present

## 2021-11-19 DIAGNOSIS — E118 Type 2 diabetes mellitus with unspecified complications: Secondary | ICD-10-CM

## 2021-11-19 DIAGNOSIS — E1122 Type 2 diabetes mellitus with diabetic chronic kidney disease: Secondary | ICD-10-CM | POA: Diagnosis not present

## 2021-11-19 DIAGNOSIS — Z794 Long term (current) use of insulin: Secondary | ICD-10-CM

## 2021-11-19 DIAGNOSIS — N1831 Chronic kidney disease, stage 3a: Secondary | ICD-10-CM

## 2021-11-19 LAB — POCT GLYCOSYLATED HEMOGLOBIN (HGB A1C): Hemoglobin A1C: 9.5 % — AB (ref 4.0–5.6)

## 2021-11-19 NOTE — Patient Instructions (Addendum)
The HgA1c is 9.5 today so we should consider adding another medicine to help lower the sugars.

## 2021-11-19 NOTE — Progress Notes (Signed)
   Subjective:   Patient ID: [name], female    DOB: 01-16-1946, 75 y.o.   MRN: [id number]  HPI The patient is a 76 YO female coming in for diabetes management. Recent switch to tresiba 45 units from BID insulin otc.  Morning sugars in the low 100s.   Review of Systems  Constitutional:  Positive for activity change.  HENT: Negative.    Eyes: Negative.   Respiratory:  Negative for cough, chest tightness and shortness of breath.   Cardiovascular:  Positive for leg swelling. Negative for chest pain and palpitations.  Gastrointestinal:  Positive for diarrhea. Negative for abdominal distention, abdominal pain, constipation, nausea and vomiting.  Musculoskeletal:  Positive for arthralgias, gait problem and myalgias.  Skin: Negative.   Neurological:  Positive for numbness. Negative for seizures.  Psychiatric/Behavioral: Negative.     Objective:  Physical Exam Constitutional:      Appearance: She is well-developed.  HENT:     Head: Normocephalic and atraumatic.  Cardiovascular:     Rate and Rhythm: Normal rate and regular rhythm.  Pulmonary:     Effort: Pulmonary effort is normal. No respiratory distress.     Breath sounds: Normal breath sounds. No wheezing or rales.  Abdominal:     General: Bowel sounds are normal. There is no distension.     Palpations: Abdomen is soft.     Tenderness: There is no abdominal tenderness. There is no rebound.  Musculoskeletal:        General: Tenderness present.     Cervical back: Normal range of motion.     Right lower leg: Edema present.     Left lower leg: Edema present.  Skin:    General: Skin is warm and dry.  Neurological:     Mental Status: She is alert and oriented to person, place, and time.     Coordination: Coordination normal.    Vitals:   11/19/21 1334  BP: 128/84  Pulse: 79  Resp: 18  SpO2: 97%  Weight: 183 lb 3.2 oz (83.1 kg)  Height: 5\' 4"  (1.626 m)    This visit occurred during the SARS-CoV-2 public health  emergency.  Safety protocols were in place, including screening questions prior to the visit, additional usage of staff PPE, and extensive cleaning of exam room while observing appropriate contact time as indicated for disinfecting solutions.   Assessment & Plan:

## 2021-11-21 DIAGNOSIS — E1140 Type 2 diabetes mellitus with diabetic neuropathy, unspecified: Secondary | ICD-10-CM | POA: Diagnosis not present

## 2021-11-21 DIAGNOSIS — Z9181 History of falling: Secondary | ICD-10-CM | POA: Diagnosis not present

## 2021-11-21 DIAGNOSIS — I872 Venous insufficiency (chronic) (peripheral): Secondary | ICD-10-CM | POA: Diagnosis not present

## 2021-11-21 DIAGNOSIS — I5032 Chronic diastolic (congestive) heart failure: Secondary | ICD-10-CM | POA: Diagnosis not present

## 2021-11-21 DIAGNOSIS — E1151 Type 2 diabetes mellitus with diabetic peripheral angiopathy without gangrene: Secondary | ICD-10-CM | POA: Diagnosis not present

## 2021-11-21 DIAGNOSIS — I890 Lymphedema, not elsewhere classified: Secondary | ICD-10-CM | POA: Diagnosis not present

## 2021-11-23 NOTE — Assessment & Plan Note (Signed)
POC HgA1c done and slightly improved. Given that she has just changed insulin to tresiba 45 units daily for BID insulin we will monitor for now. Morning sugars are at goal so we cannot titrate this up today. This change was only a few weeks ago so will follow up 3 months and then change regimen depending on results. We cannot increase metformin due to diarrhea and she takes this inconsistently due to diarrhea. She is taking rybelsus 14 mg daily.

## 2021-11-23 NOTE — Assessment & Plan Note (Signed)
She is still struggling with this but overall she is satisfied with control and does not want change of celexa 20 mg daily today.

## 2021-11-23 NOTE — Assessment & Plan Note (Signed)
Overall stable, maybe mild progression she is unsure. Uses gabapentin 300 mg BID currently. Foot exam done.

## 2021-11-26 ENCOUNTER — Other Ambulatory Visit (HOSPITAL_COMMUNITY): Payer: Medicare Other

## 2021-11-29 ENCOUNTER — Ambulatory Visit (INDEPENDENT_AMBULATORY_CARE_PROVIDER_SITE_OTHER): Payer: Medicare Other

## 2021-11-29 DIAGNOSIS — E785 Hyperlipidemia, unspecified: Secondary | ICD-10-CM

## 2021-11-29 DIAGNOSIS — E1151 Type 2 diabetes mellitus with diabetic peripheral angiopathy without gangrene: Secondary | ICD-10-CM | POA: Diagnosis not present

## 2021-11-29 DIAGNOSIS — I872 Venous insufficiency (chronic) (peripheral): Secondary | ICD-10-CM | POA: Diagnosis not present

## 2021-11-29 DIAGNOSIS — I890 Lymphedema, not elsewhere classified: Secondary | ICD-10-CM | POA: Diagnosis not present

## 2021-11-29 DIAGNOSIS — E1140 Type 2 diabetes mellitus with diabetic neuropathy, unspecified: Secondary | ICD-10-CM | POA: Diagnosis not present

## 2021-11-29 DIAGNOSIS — E118 Type 2 diabetes mellitus with unspecified complications: Secondary | ICD-10-CM

## 2021-11-29 DIAGNOSIS — Z9181 History of falling: Secondary | ICD-10-CM | POA: Diagnosis not present

## 2021-11-29 DIAGNOSIS — I5032 Chronic diastolic (congestive) heart failure: Secondary | ICD-10-CM | POA: Diagnosis not present

## 2021-11-29 NOTE — Progress Notes (Cosign Needed)
Chronic Care Management Pharmacy Note  11/29/2021 Name:  [name] MRN:  [id number] DOB:  Sep 02, 1946  Summary: -Patient a difficult historian reports that she continues to have issues with libre beeping overnight and throughout the day, notes that she does not check very often - maybe once daily  - could not recall BG average  -Reports that she has been taking atorvastatin 69m - prescription was filled 03/18/2021 - has not started latest prescription 833mprescription last filled 10/04/2021 per medication list  -Reports that blood pressures were being checked by home health nurse - unaware of any BP results - today was the last visit from the home health nurse -[facility], [facility] - using monthly pill container at this time no longer receiving pre-packed pills   Recommendations/Changes made from today's visit: -Reviewed importance of medication adherence with patient / intended dosing of medications, advised for patient to restart taking atorvastatin 8021maily as prescribed, to reduce torsemide to 1m73mce daily as prescribed as well -Reviewed with patient importance of blood sugar monitoring/ control, patient to check at least 3 times daily and note blood sugars for next appointment - bring in for next inperson appointment for review - no changes to dosages of medications at this time as current level of control is unknown  -Dicussed with patient about pill packing options from [facility] - patient declined at this time - stressed multiple times about how this could improve adherence, patient was not interested    Subjective: Cynthia Heenanan 75 y26. year old female who is a primary patient of CrawHoyt Koch.  The CCM team was consulted for assistance with disease management and care coordination needs.    Engaged with patient by telephone for follow up visit in response to provider referral for  pharmacy case management and/or care coordination services.   Consent to Services:  The patient was given information about Chronic Care Management services, agreed to services, and gave verbal consent prior to initiation of services.  Please see initial visit note for detailed documentation.   Patient Care Team: CrawHoyt Koch as PCP - General (Internal Medicine) RandSkeet Latch as PCP - Cardiology (Cardiology) [place], OD ([place]) FoltRed OakndCleaster CorinH [facility]armacist (Pharmacist)  Patient is from [place] moved away after HS. She moved back to [place] her sister passed at age 70. 32r husband left her around the same time. She does not have any family close by and lives alone. She does not drive, she uses Medicare transportation service to get to doctor's appts however she has not had good experiences with them and has missed appts because of it.   Recent office visits: 11/19/2021 - Dr. CrawSharlet Salina1c shows mild improvement  - recent switch to tresiba - no changes to medications - follow up in 3 months   Recent consult visits: None since last visit    Hospital visits: None since last visit   Objective:  Lab Results  Component Value Date   CREATININE 0.67 07/13/2021   BUN 17 07/13/2021   GFR 52.42 (L) 06/12/2021   GFRNONAA >60 07/13/2021   GFRAA 99 06/30/2020   NA 136 07/13/2021   K 4.3 07/13/2021   CALCIUM 8.4 (L) 07/13/2021   CO2 29 07/13/2021    Lab Results  Component Value Date/Time   HGBA1C 9.5 (A) 11/19/2021 01:45 PM   HGBA1C 9.8 (H) 06/12/2021  02:36 PM   HGBA1C 12.6 (H) 03/14/2021 05:06 PM   HGBA1C 11.2 03/20/2020 11:40 AM   HGBA1C 11.2 (A) 03/20/2020 11:40 AM   HGBA1C 11.2 (A) 03/20/2020 11:40 AM   GFR 52.42 (L) 06/12/2021 02:36 PM   GFR 86.05 02/22/2021 03:36 PM   MICROALBUR 20.4 (H) 03/20/2020 11:57 AM   MICROALBUR 12.1 (H) 05/10/2019 10:50 AM    Last diabetic Eye exam:  Lab Results  Component Value Date/Time    HMDIABEYEEXA Retinopathy (A) 03/16/2020 11:43 AM    Last diabetic Foot exam:  Lab Results  Component Value Date/Time   HMDIABFOOTEX Done 11/09/2010 12:00 AM     Lab Results  Component Value Date   CHOL 180 06/12/2021   HDL 44.60 06/12/2021   LDLCALC 99 06/12/2021   LDLDIRECT 110.0 11/12/2018   TRIG 180.0 (H) 06/12/2021   CHOLHDL 4 06/12/2021    Hepatic Function Latest Ref Rng & Units 07/10/2021 07/09/2021 06/12/2021  Total Protein 6.5 - 8.1 g/dL 5.7(L) 6.3(L) 7.6  Albumin 3.5 - 5.0 g/dL 2.2(L) 2.4(L) 3.8  AST 15 - 41 U/L '24 22 25  ' ALT 0 - 44 U/L 19 21 45(H)  Alk Phosphatase 38 - 126 U/L 124 128(H) 179(H)  Total Bilirubin 0.3 - 1.2 mg/dL 0.7 2.0(H) 0.4    Lab Results  Component Value Date/Time   TSH 4.436 07/09/2021 09:02 PM   TSH 4.000 05/14/2020 04:45 AM   TSH 2.15 08/08/2015 10:56 AM   TSH 1.306 12/19/2014 06:27 PM    CBC Latest Ref Rng & Units 07/11/2021 07/10/2021 07/09/2021  WBC 4.0 - 10.5 K/uL 9.2 10.2 13.2(H)  Hemoglobin 12.0 - 15.0 g/dL 12.4 13.0 13.9  Hematocrit 36.0 - 46.0 % 38.1 38.6 42.0  Platelets 150 - 400 K/uL 189 217 216    No results found for: VD25OH  Clinical ASCVD: Yes  The ASCVD Risk score (Arnett DK, et al., 2019) failed to calculate for the following reasons:   The patient has a prior MI or stroke diagnosis    Depression screen [facility] 2/9 10/08/2021 05/23/2020 06/28/2019  Decreased Interest 0 0 3  Down, Depressed, Hopeless 0 0 3  PHQ - 2 Score 0 0 6  Altered sleeping - - 3  Tired, decreased energy - - 3  Change in appetite - - 1  Feeling bad or failure about yourself  - - 2  Trouble concentrating - - 0  Moving slowly or fidgety/restless - - 0  Suicidal thoughts - - 0  PHQ-9 Score - - 15  Some recent data might be hidden     Social History   Tobacco Use  Smoking Status Never  Smokeless Tobacco Never   BP Readings from Last 3 Encounters:  11/19/21 128/84  08/14/21 132/68  07/13/21 (!) 115/53   Pulse Readings from Last 3 Encounters:   11/19/21 79  08/14/21 70  07/13/21 75   Wt Readings from Last 3 Encounters:  11/19/21 183 lb 3.2 oz (83.1 kg)  08/14/21 172 lb 6.4 oz (78.2 kg)  07/09/21 188 lb (85.3 kg)   BMI Readings from Last 3 Encounters:  11/19/21 31.45 kg/m  08/14/21 29.59 kg/m  07/09/21 32.27 kg/m    Assessment/Interventions: Review of patient past medical history, allergies, medications, health status, including review of consultants reports, laboratory and other test data, was performed as part of comprehensive evaluation and provision of chronic care management services.   SDOH:  (Social Determinants of Health) assessments and interventions performed: Yes   SDOH Screenings  Alcohol Screen: Not on file  Depression (PHQ2-9): Low Risk    PHQ-2 Score: 0  Financial Resource Strain: Medium Risk   Difficulty of Paying Living Expenses: Somewhat hard  Food Insecurity: Not on file  Housing: Not on file  Physical Activity: Not on file  Social Connections: Not on file  Stress: Not on file  Tobacco Use: Low Risk    Smoking Tobacco Use: Never   Smokeless Tobacco Use: Never   Passive Exposure: Not on file  Transportation Needs: Unmet Transportation Needs   Lack of Transportation (Medical): Yes   Lack of Transportation (Non-Medical): Yes   CCM Care Plan  Allergies  Allergen Reactions   Morphine Other (See Comments)    'took me out of this world' I had to be resuscitated   Codeine Sulfate Nausea Only and Other (See Comments)    GI upset and pain   Demerol [Meperidine] Nausea And Vomiting   Propoxyphene Hcl Other (See Comments)    Darvocet caused sick headache    Medications Reviewed Today     Reviewed by Hoyt Koch, MD (Physician) on 11/19/21 at 1343  Med List Status: <None>   Medication Order Taking? Sig Documenting Provider Last Dose Status Informant  acetaminophen (TYLENOL) 325 MG tablet [id number] Yes Take 325-650 mg by mouth daily as needed for mild pain or headache. [provider] Taking Active Multiple Informants  amoxicillin (AMOXIL) 500 MG tablet [id number] Yes Take 4 tablets (2,000 mg total) by mouth as directed. 1 hour prior to dental work including cleanings  Patient taking differently: Take 2,000 mg by mouth See admin instructions. Take 4 tablets (2000 mg) by mouth 1 hour prior to dental work including cleanings   Crista Luria Taking Active   ASPERCREME LIDOCAINE EX [id number] Yes Apply 1 application topically 5 (five) times daily as needed (pain). [provider] Taking Active Multiple Informants  aspirin 81 MG tablet [id number] Yes Take 81 mg by mouth daily. [provider] Taking Active Multiple Informants  atorvastatin (LIPITOR) 80 MG tablet [id number] Yes Take 1 tablet (80 mg total) by mouth daily. Hoyt Koch, MD Taking Active   citalopram (CELEXA) 20 MG tablet [id number] Yes Take 1 tablet (20 mg total) by mouth daily. Hoyt Koch, MD Taking Active   collagenase Annitta Needs) ointment [id number] Yes Apply 1 application topically 3 (three) times daily. Apply to sacrum [provider] Taking Active   Continuous Blood Gluc Sensor ([place]) [place] [id number] Yes Use to monitor sugars Hoyt Koch, MD Taking Active   esomeprazole (NEXIUM) 20 MG capsule [id number] Yes Take 1 capsule (20 mg total) by mouth daily at 12 noon.  Patient taking differently: Take 20 mg by mouth every morning.   Hoyt Koch, MD Taking Active            Med Note Rosine Beat Jul 09, 2021  8:34 PM) OTC  ezetimibe (ZETIA) 10 MG tablet [id number] Yes Take 1 tablet (10 mg total) by mouth daily. Hoyt Koch, MD Taking Active   gabapentin (NEURONTIN) 300 MG capsule [id number] Yes Take 1 capsule (300 mg total) by mouth 2 (two) times daily. Domenic Polite, MD Taking Active   glucose blood [facility] VERIO) test strip [id number] Yes USE AS DIRECTED Hoyt Koch, MD Taking  Active Multiple Informants  insulin degludec (TRESIBA FLEXTOUCH) 200 UNIT/ML FlexTouch Pen [id number] Yes Inject 44 Units into the skin at bedtime. Hoyt Koch, MD Taking Active  Insulin Pen Needle 31G X 8 MM MISC [id number] Yes Use to inject insulin four times daily E11.41 Hoyt Koch, MD Taking Active   metFORMIN (GLUCOPHAGE) 500 MG tablet [id number] Yes Take 1 tablet (500 mg total) by mouth 2 (two) times daily with a meal. Hoyt Koch, MD Taking Active   metoprolol tartrate (LOPRESSOR) 25 MG tablet [id number] Yes Take 0.5 tablets (12.5 mg total) by mouth 2 (two) times daily. Hoyt Koch, MD Taking Active   Naphazoline HCl (CLEAR EYES OP) [id number] Yes Place 1 drop into both eyes daily as needed (dry eyes/itching). [provider] Taking Active Multiple Informants  nystatin (MYCOSTATIN/NYSTOP) powder [id number] Yes Apply 1 application topically 3 (three) times daily. Hoyt Koch, MD Taking Active Multiple Informants  Potassium Chloride ER 20 MEQ TBCR [id number]  Take 20 mEq by mouth daily. Hoyt Koch, MD  Expired 11/03/21 2359   Semaglutide (RYBELSUS) 14 MG TABS [id number] Yes Take 14 mg by mouth daily. Hoyt Koch, MD Taking Active   sodium chloride (OCEAN) 0.65 % SOLN nasal spray [id number] Yes Place 1 spray into both nostrils 4 (four) times daily.  Patient taking differently: Place 1 spray into both nostrils 4 (four) times daily as needed for congestion.   Hoyt Koch, MD Taking Active   torsemide [facility]) 20 MG tablet [id number] Yes Take 1 tablet (20 mg total) by mouth daily. 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   Near syncope 05/11/2020   Pressure injury of skin 05/11/2020   Orthostatic hypotension 05/11/2020   Dental caries 05/11/2020   Aortic  stenosis    Diabetic neuropathy ([place]) 11/13/2018   Left knee pain 10/24/2017   Greater trochanteric bursitis of left hip 10/24/2017   Atypical chest pain 10/10/2015   Reactive airway disease 04/09/2014   Seasonal allergic rhinitis 04/09/2014   Preventative health care 04/09/2014   Abnormality of gait 03/17/2013   Irritable bowel syndrome (IBS) 08/27/2012   Shoulder pain 09/25/2009   Type 2 diabetes with complication ([place]) 78/93/8101   MDD (major depressive disorder) 06/18/2007   GERD 06/18/2007   Hyperlipidemia associated with type 2 diabetes mellitus ([place]) 12/27/2006   Essential hypertension 09/19/2006   Venous (peripheral) insufficiency 09/19/2006    Immunization History  Administered Date(s) Administered   Fluad Quad(high Dose 65+) 08/30/2019, 09/09/2020, 08/14/2021   Influenza Split 08/27/2012   Influenza Whole 09/27/2005, 09/18/2007, 08/29/2009, 11/09/2010   Influenza, High Dose Seasonal PF 10/21/2017, 11/12/2018   Influenza,inj,Quad PF,6+ Mos 10/22/2013, 12/20/2014, 10/09/2015, 07/29/2016   Moderna SARS-COV2 Booster Vaccination 07/13/2021   PFIZER(Purple Top)SARS-COV-2 Vaccination 02/03/2020, 02/29/2020   Pneumococcal Conjugate-13 07/23/2016   Pneumococcal Polysaccharide-23 02/09/2013, 12/20/2014   Tdap 08/27/2012   Zoster, Live 02/19/2013    Conditions to be addressed/monitored:  Hypertension, Hyperlipidemia, Diabetes, and Coronary Artery Disease  Care Plan : [place]  Updates made by Tomasa Blase, RPH since 11/29/2021 12:00 AM     Problem: Hypertension, Hyperlipidemia, Diabetes, and Coronary Artery Disease   Priority: High     Long-Range Goal: Disease management   Start Date: 01/19/2021  Expected End Date: 11/29/2022  This Visit's Progress: Not on track  Recent Progress: On track  Priority: High  Note:   Current Barriers:  Unable to independently monitor therapeutic efficacy Suboptimal therapeutic regimen for  diabetes Does not adhere to  prescribed medication regimen Does not maintain contact with provider office  Pharmacist Clinical Goal(s):  Patient will achieve adherence to monitoring guidelines and medication adherence to achieve therapeutic efficacy adhere to plan to optimize therapeutic regimen for diabetes as evidenced by report of adherence to recommended medication management changes through collaboration with PharmD and provider.   Interventions: 1:1 collaboration with Hoyt Koch, MD regarding development and update of comprehensive plan of care as evidenced by provider attestation and co-signature Inter-disciplinary care team collaboration (see longitudinal plan of care) Comprehensive medication review performed; medication list updated in electronic medical record  Hypertension (Goal: BP < 140/90, and prevent exacerbations) -control is unknown at this time pt does not endorse swelling problems currently; BP was at goal in recent office visit - [facility] nurses have been checking BP, patient unaware of results  -Pt is not checking BP at home by herself  - monitor is broken -HF Type: Diastolic (grade 2 dysfunction) -Ejection fraction: 60-65% (Date: 06/15/20) -Current treatment Metoprolol tartrate 25 mg - 1/2 tab BID  Torsemide 20 mg daily - takes 1 tab daily, extra tab PRN - has been taking 43m BID  Potassium 20 mEq daily  -Medications previously tried: lisinopril (stopped during hospitalization for AKI, never restarted) -Counseld on importance of potassium supplement while taking torsemide - patient complains of excessive urination issues, denies any issues with swelling currently, recommended reduce torsemide to 269mdaily  -Recommend to continue current medication  Hyperlipidemia: (LDL goal < 70) -Uncontrolled - LDL is above goal 06/2021; patient is not adherent to statin therapy - reports that she is taking atorvastatin 4034maily (bottle filled 03/18/2021) Lab Results  Component Value Date   LDLZachary  06/12/2021  -Hx of NSTEMI -Current treatment: Atorvastatin 80 mg daily - reports to taking 46m54maly  Ezetimibe 10 mg daily Aspirin 81 mg daily -Medications previously tried: pravastatin  -Educated on Cholesterol goals; Benefits of statin for ASCVD risk reduction; -Counseled on statin adherence - patient is to start taking atorvastatin 80mg46mly  -Recommend to continue current medication  Diabetes (A1c goal <8%) -Improving - A1c improved from >12% to 9.8% after starting Rybelsus; she endorses compliance with medication and insulin but has previously struggled with adherence; she reports she is wearing Freestyle Libre sensor and reports it "beeps all the time" and she does not scan it very often; Lab Results  Component Value Date   HGBA1C 9.5 (A) 11/19/2021  -Patient reports to compliance with diabetic medications  -Home BG readings: unknown at this time, reports that BG is never <100, and rarely >200 -Current medications: Metformin 500 mg BID Tresiba - 45 units daily  Rybelsus 14 mg daily - takes it after eating Freestyle Libre 2 Relion glucometer and test strips -Medications previously tried: BasagEngineer, agriculturalolog, novolin -Counseled on hypoglycemia prevention and management -Advised pt to scan LibreTetherowor before breakfast, whenever it beeps, and when she feels shaky/clammy; counseled pt that device will only beep when BG is <70 or > 250, so her BG is probably out of range often if she is having issues with it constantly beeping -Patient to start checking blood sugars at least 3 times daily, reach out with any issues or hypoglycemia or if BG is >200 on average  -Plan - continue current medications -stressed importance of adherence   Patient Goals/Self-Care Activities Patient will:  - take medications as prescribed -focus on medication adherence by pill box / reach out to office to switch to pre-packed  medication from [facility]  -check glucose via Freestyle Libre 2 - keep reader in  pocket at all times      Compliance/Adherence/Medication fill history: Care Gaps: Foot exam Eye exam  Urine microalbumin  Shingles Vaccine    Medication Assistance: None required.  Patient affirms current coverage meets needs. She is enrolled in LIS.  Patient's preferred pharmacy is:  [facility] [place] [address] Phone: [phone] Fax: [phone]  [facility] - [place], [address] [place] [address] Phone: [phone] Fax: [phone]   Uses pill box? Yes - monthly box Pt endorses 100% compliance  Care Plan and Follow Up Patient Decision:  Patient agrees to Care Plan and Follow-up.  Plan: Telephone follow up appointment with care management team member scheduled for:  2 months   Tomasa Blase, PharmD Clinical Pharmacist, [place]

## 2021-11-29 NOTE — Patient Instructions (Signed)
Visit Information  Following are the goals we discussed today:   Manage My Medications   Timeframe:  Long-Range Goal Priority:  High Start Date:     01/19/21                        Expected End Date:       11/29/2022                Follow Up Date Feb 2023   - call for medicine refill 2 or 3 days before it runs out - call if I am sick and can't take my medicine - keep a list of all the medicines I take; vitamins and herbals too - use a pillbox to sort medicine  -Make sure that all medications as being taken according to directions on bottle, call office with any questions   Why is this important?   These steps will help you keep on track with your medicines.  If unable to manage medications, reach out to office so that prepackaged medications from [facility] can be set up  Plan: Telephone follow up appointment with care management team member scheduled for:  2 months  The patient has been provided with contact information for the care management team and has been advised to call with any health related questions or concerns.   Ellin Saba, PharmD Clinical Pharmacist, [facility]   Please call the care guide team at [phone] if you need to cancel or reschedule your appointment.   Patient verbalizes understanding of instructions provided today and agrees to view in MyChart.

## 2021-12-08 DIAGNOSIS — E118 Type 2 diabetes mellitus with unspecified complications: Secondary | ICD-10-CM

## 2021-12-08 DIAGNOSIS — E1169 Type 2 diabetes mellitus with other specified complication: Secondary | ICD-10-CM

## 2021-12-08 DIAGNOSIS — E785 Hyperlipidemia, unspecified: Secondary | ICD-10-CM

## 2021-12-28 ENCOUNTER — Telehealth: Payer: Self-pay | Admitting: Internal Medicine

## 2021-12-28 NOTE — Telephone Encounter (Signed)
Patient calling in  Patient says the open wounds on her leg have came back & are very sore & painful.Marland Kitchen also says they are leaking some jelly like secretions & has a smell to it  Patient has OV scheduled for 01/24 but wants to know if there are any OTC ointments she can use over the weekend until her visit  Please call [phone]

## 2021-12-28 NOTE — Telephone Encounter (Signed)
Triple antibiotic ointment or silvadene otc okay to use.

## 2021-12-31 NOTE — Telephone Encounter (Signed)
Called and left vm with  provider's recommendations. 

## 2022-01-01 ENCOUNTER — Ambulatory Visit (INDEPENDENT_AMBULATORY_CARE_PROVIDER_SITE_OTHER): Payer: Medicare Other | Admitting: Internal Medicine

## 2022-01-01 ENCOUNTER — Other Ambulatory Visit: Payer: Self-pay

## 2022-01-01 ENCOUNTER — Encounter: Payer: Self-pay | Admitting: Internal Medicine

## 2022-01-01 VITALS — BP 130/70 | HR 84 | Resp 18 | Ht 64.0 in | Wt 191.0 lb

## 2022-01-01 DIAGNOSIS — I872 Venous insufficiency (chronic) (peripheral): Secondary | ICD-10-CM | POA: Diagnosis not present

## 2022-01-01 DIAGNOSIS — E11622 Type 2 diabetes mellitus with other skin ulcer: Secondary | ICD-10-CM

## 2022-01-01 DIAGNOSIS — L97921 Non-pressure chronic ulcer of unspecified part of left lower leg limited to breakdown of skin: Secondary | ICD-10-CM

## 2022-01-01 DIAGNOSIS — E118 Type 2 diabetes mellitus with unspecified complications: Secondary | ICD-10-CM

## 2022-01-01 MED ORDER — SILVER SULFADIAZINE 1 % EX CREA: 1.0000 "application " | TOPICAL_CREAM | Freq: Every day | CUTANEOUS | 0 refills | Status: DC

## 2022-01-01 NOTE — Progress Notes (Signed)
° °  Subjective:   Patient ID: [name], female    DOB: 17-Jul-1946, 76 y.o.   MRN: [id number]  HPI The patient is a 76 YO female coming in for injury about 4 days ago with wounds to shins bilaterally. She has chronic swelling there and is concerned about getting cellulitis. No fevers or chills. Some pain with palpation.  Review of Systems  Constitutional: Negative.   HENT: Negative.    Eyes: Negative.   Respiratory:  Negative for cough, chest tightness and shortness of breath.   Cardiovascular:  Positive for leg swelling. Negative for chest pain and palpitations.  Gastrointestinal:  Negative for abdominal distention, abdominal pain, constipation, diarrhea, nausea and vomiting.  Musculoskeletal: Negative.   Skin:  Positive for wound.  Neurological: Negative.   Psychiatric/Behavioral: Negative.     Objective:  Physical Exam Constitutional:      Appearance: She is well-developed.  HENT:     Head: Normocephalic and atraumatic.  Cardiovascular:     Rate and Rhythm: Normal rate and regular rhythm.  Pulmonary:     Effort: Pulmonary effort is normal. No respiratory distress.     Breath sounds: Normal breath sounds. No wheezing or rales.  Abdominal:     General: Bowel sounds are normal. There is no distension.     Palpations: Abdomen is soft.     Tenderness: There is no abdominal tenderness. There is no rebound.  Musculoskeletal:     Cervical back: Normal range of motion.     Right lower leg: Edema present.     Left lower leg: Edema present.     Comments: 3+ edema to thighs bilaterally, stable from prior  Skin:    General: Skin is warm and dry.     Comments: Wound to bilateral shins that are not deep, without signs of cellulitis  Neurological:     Mental Status: She is alert and oriented to person, place, and time.     Coordination: Coordination normal.    Vitals:   01/01/22 1402  BP: 130/70  Pulse: 84  Resp: 18  SpO2: 98%  Weight: 191 lb (86.6 kg)  Height: 5'  4" (1.626 m)    This visit occurred during the SARS-CoV-2 public health emergency.  Safety protocols were in place, including screening questions prior to the visit, additional usage of staff PPE, and extensive cleaning of exam room while observing appropriate contact time as indicated for disinfecting solutions.   Assessment & Plan:

## 2022-01-01 NOTE — Patient Instructions (Signed)
We have sent in silvadene ointment to use daily on the legs.   We will check the blood flow in the legs to make sure there are no blockages.

## 2022-01-03 ENCOUNTER — Encounter: Payer: Self-pay | Admitting: Internal Medicine

## 2022-01-03 DIAGNOSIS — L97921 Non-pressure chronic ulcer of unspecified part of left lower leg limited to breakdown of skin: Secondary | ICD-10-CM | POA: Insufficient documentation

## 2022-01-03 DIAGNOSIS — E11622 Type 2 diabetes mellitus with other skin ulcer: Secondary | ICD-10-CM | POA: Insufficient documentation

## 2022-01-03 NOTE — Assessment & Plan Note (Signed)
Swelling is slightly more than usual and she is taking her medications as prescribed per report although fill pattern is not consistent with this. She does not take if needing to leave house. Recommended take daily.

## 2022-01-03 NOTE — Assessment & Plan Note (Signed)
No signs of infection. Rx for silvadene ointment. Checking ABI to assess for risk of poor healing. Right foot cooler to touch than left. Poorly palpable pulses due to edema.

## 2022-01-03 NOTE — Assessment & Plan Note (Signed)
Poorly controlled diabetes which is improving marginally (last HgA1c 9.5). 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 She has wounds on bilateral shins making her at risk for poor healing. Depending on results may need referral to vascular surgery.

## 2022-01-15 ENCOUNTER — Telehealth: Payer: Medicare Other

## 2022-01-15 NOTE — Progress Notes (Incomplete)
Chronic Care Management Pharmacy Note  01/15/2022 Name:  [name] MRN:  [id number] DOB:  [birth year]  Summary: -Patient a difficult historian reports that she continues to have issues with libre beeping overnight and throughout the day, notes that she does not check very often - maybe once daily  - could not recall BG average  -Reports that she has been taking atorvastatin 44m - prescription was filled 03/18/2021 - has not started latest prescription 817mprescription last filled 10/04/2021 per medication list  -Reports that blood pressures were being checked by home health nurse - unaware of any BP results - today was the last visit from the home health nurse -[facility], [facility] - using monthly pill container at this time no longer receiving pre-packed pills   Recommendations/Changes made from today's visit: -Reviewed importance of medication adherence with patient / intended dosing of medications, advised for patient to restart taking atorvastatin 8038maily as prescribed, to reduce torsemide to 79m74mce daily as prescribed as well -Reviewed with patient importance of blood sugar monitoring/ control, patient to check at least 3 times daily and note blood sugars for next appointment - bring in for next inperson appointment for review - no changes to dosages of medications at this time as current level of control is unknown  -Dicussed with patient about pill packing options from [facility] - patient declined at this time - stressed multiple times about how this could improve adherence, patient was not interested    Subjective: [name] 76 y19. year old female who is a primary patient of CrawHoyt Koch.  The CCM team was consulted for assistance with disease management and care coordination needs.    Engaged with patient by telephone for follow up visit in response to provider referral for pharmacy  case management and/or care coordination services.   Consent to Services:  The patient was given information about Chronic Care Management services, agreed to services, and gave verbal consent prior to initiation of services.  Please see initial visit note for detailed documentation.   Patient Care Team: CrawHoyt Koch as PCP - General (Internal Medicine) RandSkeet Latch as PCP - Cardiology (Cardiology) [place], OD (Kalaheotometry) FoltIoniandCleaster CorinH [facility] LLCPharmacist (Pharmacist)  Patient is from [place] moved away after HS. She moved back to [place] her sister passed at age 41. 3r husband left her around the same time. She does not have any family close by and lives alone. She does not drive, she uses Medicare transportation service to get to doctor's appts however she has not had good experiences with them and has missed appts because of it.   Recent office visits: 01/01/2022 - Dr. CrawSharlet Salinailateral shin wounds - no signs of infection - prescription for silvadene ointment  - check blood flow to legs to ensure there are no blockages   Recent consult visits: None since last visit    Hospital visits: None since last visit   Objective:  Lab Results  Component Value Date   CREATININE 0.67 07/13/2021   BUN 17 07/13/2021   GFR 52.42 (L) 06/12/2021   GFRNONAA >60 07/13/2021   GFRAA 99 06/30/2020   NA 136 07/13/2021   K 4.3 07/13/2021   CALCIUM 8.4 (L) 07/13/2021   CO2 29 07/13/2021    Lab Results  Component Value Date/Time   HGBA1C 9.5 (A) 11/19/2021 01:45 PM  HGBA1C 9.8 (H) 06/12/2021 02:36 PM   HGBA1C 12.6 (H) 03/14/2021 05:06 PM   HGBA1C 11.2 03/20/2020 11:40 AM   HGBA1C 11.2 (A) 03/20/2020 11:40 AM   HGBA1C 11.2 (A) 03/20/2020 11:40 AM   GFR 52.42 (L) 06/12/2021 02:36 PM   GFR 86.05 02/22/2021 03:36 PM   MICROALBUR 20.4 (H) 03/20/2020 11:57 AM   MICROALBUR 12.1 (H) 05/10/2019 10:50 AM    Last diabetic Eye exam:  Lab Results   Component Value Date/Time   HMDIABEYEEXA Retinopathy (A) 03/16/2020 11:43 AM    Last diabetic Foot exam:  Lab Results  Component Value Date/Time   HMDIABFOOTEX Done 11/09/2010 12:00 AM     Lab Results  Component Value Date   CHOL 180 06/12/2021   HDL 44.60 06/12/2021   LDLCALC 99 06/12/2021   LDLDIRECT 110.0 11/12/2018   TRIG 180.0 (H) 06/12/2021   CHOLHDL 4 06/12/2021    Hepatic Function Latest Ref Rng & Units 07/10/2021 07/09/2021 06/12/2021  Total Protein 6.5 - 8.1 g/dL 5.7(L) 6.3(L) 7.6  Albumin 3.5 - 5.0 g/dL 2.2(L) 2.4(L) 3.8  AST 15 - 41 U/L '24 22 25  ' ALT 0 - 44 U/L 19 21 45(H)  Alk Phosphatase 38 - 126 U/L 124 128(H) 179(H)  Total Bilirubin 0.3 - 1.2 mg/dL 0.7 2.0(H) 0.4    Lab Results  Component Value Date/Time   TSH 4.436 07/09/2021 09:02 PM   TSH 4.000 05/14/2020 04:45 AM   TSH 2.15 08/08/2015 10:56 AM   TSH 1.306 12/19/2014 06:27 PM    CBC Latest Ref Rng & Units 07/11/2021 07/10/2021 07/09/2021  WBC 4.0 - 10.5 K/uL 9.2 10.2 13.2(H)  Hemoglobin 12.0 - 15.0 g/dL 12.4 13.0 13.9  Hematocrit 36.0 - 46.0 % 38.1 38.6 42.0  Platelets 150 - 400 K/uL 189 217 216    No results found for: VD25OH  Clinical ASCVD: Yes  The ASCVD Risk score (Arnett DK, et al., 2019) failed to calculate for the following reasons:   The patient has a prior MI or stroke diagnosis    Depression screen [facility] 2/9 01/01/2022 10/08/2021 05/23/2020  Decreased Interest 0 0 0  Down, Depressed, Hopeless 0 0 0  PHQ - 2 Score 0 0 0  Altered sleeping 0 - -  Tired, decreased energy 0 - -  Change in appetite 0 - -  Feeling bad or failure about yourself  0 - -  Trouble concentrating 0 - -  Moving slowly or fidgety/restless 0 - -  Suicidal thoughts 0 - -  PHQ-9 Score 0 - -  Some recent data might be hidden     Social History   Tobacco Use  Smoking Status Never  Smokeless Tobacco Never   BP Readings from Last 3 Encounters:  01/01/22 130/70  11/19/21 128/84  08/14/21 132/68   Pulse Readings from  Last 3 Encounters:  01/01/22 84  11/19/21 79  08/14/21 70   Wt Readings from Last 3 Encounters:  01/01/22 191 lb (86.6 kg)  11/19/21 183 lb 3.2 oz (83.1 kg)  08/14/21 172 lb 6.4 oz (78.2 kg)   BMI Readings from Last 3 Encounters:  01/01/22 32.79 kg/m  11/19/21 31.45 kg/m  08/14/21 29.59 kg/m    Assessment/Interventions: Review of patient past medical history, allergies, medications, health status, including review of consultants reports, laboratory and other test data, was performed as part of comprehensive evaluation and provision of chronic care management services.   SDOH:  (Social Determinants of Health) assessments and interventions performed: Yes   SDOH  Screenings   Alcohol Screen: Not on file  Depression (PHQ2-9): Low Risk    PHQ-2 Score: 0  Financial Resource Strain: Medium Risk   Difficulty of Paying Living Expenses: Somewhat hard  Food Insecurity: Not on file  Housing: Not on file  Physical Activity: Not on file  Social Connections: Not on file  Stress: Not on file  Tobacco Use: Low Risk    Smoking Tobacco Use: Never   Smokeless Tobacco Use: Never   Passive Exposure: Not on file  Transportation Needs: Unmet Transportation Needs   Lack of Transportation (Medical): Yes   Lack of Transportation (Non-Medical): Yes   CCM Care Plan  Allergies  Allergen Reactions   Morphine Other (See Comments)    'took me out of this world' I had to be resuscitated   Codeine Sulfate Nausea Only and Other (See Comments)    GI upset and pain   Demerol [Meperidine] Nausea And Vomiting   Propoxyphene Hcl Other (See Comments)    Darvocet caused sick headache    Medications Reviewed Today     Reviewed by Hoyt Koch, MD (Physician) on 01/03/22 at 0800  Med List Status: <None>   Medication Order Taking? Sig Documenting Provider Last Dose Status Informant  acetaminophen (TYLENOL) 325 MG tablet [id number] Yes Take 325-650 mg by mouth daily as needed for mild pain or  headache. [provider] Taking Active Multiple Informants  amoxicillin (AMOXIL) 500 MG tablet [id number] Yes Take 4 tablets (2,000 mg total) by mouth as directed. 1 hour prior to dental work including cleanings  Patient taking differently: Take 2,000 mg by mouth See admin instructions. Take 4 tablets (2000 mg) by mouth 1 hour prior to dental work including cleanings   Crista Luria Taking Active   ASPERCREME LIDOCAINE EX [id number] Yes Apply 1 application topically 5 (five) times daily as needed (pain). [provider] Taking Active Multiple Informants  aspirin 81 MG tablet [id number] Yes Take 81 mg by mouth daily. [provider] Taking Active Multiple Informants  atorvastatin (LIPITOR) 80 MG tablet [id number] Yes Take 1 tablet (80 mg total) by mouth daily. Hoyt Koch, MD Taking Expired 01/02/22 2359   citalopram (CELEXA) 20 MG tablet [id number] Yes Take 1 tablet (20 mg total) by mouth daily. Hoyt Koch, MD Taking Active   collagenase Annitta Needs) ointment [id number] Yes Apply 1 application topically 3 (three) times daily. Apply to sacrum [provider] Taking Active   Continuous Blood Gluc Sensor ([place]) [place] [id number] Yes Use to monitor sugars Hoyt Koch, MD Taking Active   esomeprazole (NEXIUM) 20 MG capsule [id number] Yes Take 1 capsule (20 mg total) by mouth daily at 12 noon.  Patient taking differently: Take 20 mg by mouth every morning.   Hoyt Koch, MD Taking Active            Med Note Rosine Beat Jul 09, 2021  8:34 PM) OTC  ezetimibe (ZETIA) 10 MG tablet [id number] Yes Take 1 tablet (10 mg total) by mouth daily. Hoyt Koch, MD Taking Active   gabapentin (NEURONTIN) 300 MG capsule [id number] Yes Take 1 capsule (300 mg total) by mouth 2 (two) times daily. Domenic Polite, MD Taking Active   glucose blood [facility] VERIO) test strip [id number] Yes USE AS DIRECTED  Hoyt Koch, MD Taking Active Multiple Informants  insulin degludec (TRESIBA FLEXTOUCH) 200 UNIT/ML FlexTouch Pen [id number] Yes Inject 44 Units into the skin at bedtime. Sharlet Salina,  Real Cons, MD Taking Active   Insulin Pen Needle 31G X 8 MM MISC [id number] Yes Use to inject insulin four times daily E11.41 Hoyt Koch, MD Taking Active   metFORMIN (GLUCOPHAGE) 500 MG tablet [id number] Yes Take 1 tablet (500 mg total) by mouth 2 (two) times daily with a meal. Hoyt Koch, MD Taking Active   metoprolol tartrate (LOPRESSOR) 25 MG tablet [id number] Yes Take 0.5 tablets (12.5 mg total) by mouth 2 (two) times daily. Hoyt Koch, MD Taking Active   Naphazoline HCl (CLEAR EYES OP) [id number] Yes Place 1 drop into both eyes daily as needed (dry eyes/itching). [provider] Taking Active Multiple Informants  nystatin (MYCOSTATIN/NYSTOP) powder [id number] Yes Apply 1 application topically 3 (three) times daily. Hoyt Koch, MD Taking Active Multiple Informants  Potassium Chloride ER 20 MEQ TBCR [id number]  Take 20 mEq by mouth daily. Hoyt Koch, MD  Expired 11/03/21 2359   Semaglutide (RYBELSUS) 14 MG TABS [id number] Yes Take 14 mg by mouth daily. Hoyt Koch, MD Taking Active   silver sulfADIAZINE (SILVADENE) 1 % cream [id number] Yes Apply 1 application topically daily. Hoyt Koch, MD  Active   sodium chloride (OCEAN) 0.65 % SOLN nasal spray [id number] Yes Place 1 spray into both nostrils 4 (four) times daily.  Patient taking differently: Place 1 spray into both nostrils 4 (four) times daily as needed for congestion.   Hoyt Koch, MD Taking Active   torsemide [facility]) 20 MG tablet [id number] Yes Take 1 tablet (20 mg total) by mouth daily. 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, limited to breakdown of skin ([place]) 01/03/2022   Fall at home 07/09/2021   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   Near syncope 05/11/2020   Pressure injury of skin 05/11/2020   Orthostatic hypotension 05/11/2020   Dental caries 05/11/2020   Aortic stenosis    Diabetic neuropathy ([place]) 11/13/2018   Left knee pain 10/24/2017   Greater trochanteric bursitis of left hip 10/24/2017   Atypical chest pain 10/10/2015   Reactive airway disease 04/09/2014   Seasonal allergic rhinitis 04/09/2014   Preventative health care 04/09/2014   Abnormality of gait 03/17/2013   Irritable bowel syndrome (IBS) 08/27/2012   Shoulder pain 09/25/2009   Type 2 diabetes with complication ([place]) 91/69/4503   MDD (major depressive disorder) 06/18/2007   GERD 06/18/2007   Hyperlipidemia associated with type 2 diabetes mellitus ([place]) 12/27/2006   Essential hypertension 09/19/2006   Venous (peripheral) insufficiency 09/19/2006    Immunization History  Administered Date(s) Administered   Fluad Quad(high Dose 65+) 08/30/2019, 09/09/2020, 08/14/2021   Influenza Split 08/27/2012   Influenza Whole 09/27/2005, 09/18/2007, 08/29/2009, 11/09/2010   Influenza, High Dose Seasonal PF 10/21/2017, 11/12/2018   Influenza,inj,Quad PF,6+ Mos 10/22/2013, 12/20/2014, 10/09/2015, 07/29/2016   Moderna SARS-COV2 Booster Vaccination 07/13/2021   PFIZER(Purple Top)SARS-COV-2 Vaccination 02/03/2020, 02/29/2020   Pneumococcal Conjugate-13 07/23/2016   Pneumococcal Polysaccharide-23 02/09/2013, 12/20/2014   Tdap 08/27/2012   Zoster, Live 02/19/2013    Conditions to be addressed/monitored:  Hypertension, Hyperlipidemia, Diabetes, and Coronary Artery Disease  There are no care plans that you recently modified to display for this patient.     Compliance/Adherence/Medication fill history: Care Gaps: Foot exam Eye exam  Urine microalbumin  Shingles Vaccine  Medication Assistance: None  required.  Patient affirms current coverage meets needs. She is enrolled in LIS.  Patient's preferred pharmacy is:  [facility] [address], [place] [address] Phone: [phone] Fax: [phone]  [facility] ([facility] ) - [address], [place] [address] Phone: [phone] Fax: [phone]   Uses pill box? Yes - monthly box Pt endorses 100% compliance  Care Plan and Follow Up Patient Decision:  Patient agrees to Care Plan and Follow-up.  Plan: Telephone follow up appointment with care management team member scheduled for:  2 months   Tomasa Blase, PharmD Clinical Pharmacist, [place]

## 2022-01-21 ENCOUNTER — Ambulatory Visit (HOSPITAL_COMMUNITY): Payer: Medicare Other | Attending: Internal Medicine

## 2022-02-03 ENCOUNTER — Other Ambulatory Visit: Payer: Self-pay | Admitting: Internal Medicine

## 2022-02-03 DIAGNOSIS — E785 Hyperlipidemia, unspecified: Secondary | ICD-10-CM

## 2022-02-03 DIAGNOSIS — E1169 Type 2 diabetes mellitus with other specified complication: Secondary | ICD-10-CM

## 2022-02-07 ENCOUNTER — Ambulatory Visit (HOSPITAL_COMMUNITY)
Admission: RE | Admit: 2022-02-07 | Payer: Medicare Other | Source: Ambulatory Visit | Attending: Internal Medicine | Admitting: Internal Medicine

## 2022-02-12 ENCOUNTER — Telehealth: Payer: Self-pay

## 2022-02-12 NOTE — Progress Notes (Signed)
? ? ?Chronic Care Management ?Pharmacy Assistant  ? ?Name: [name]  MRN: [id number] DOB: Dec 12, 1945 ? ?[name] is an 76 y.o. year old female who presents for his follow-up CCM visit with the clinical pharmacist. ? ?Reason for Encounter: Disease State ?  ?Conditions to be addressed/monitored: ?DMII ? ? ?Recent office visits:  ?01/01/22 [name] A, MD-PCP (Bilateral leg wound) Orders placed: Vas [place] LE SEG Multi,Medication Changes: silver sulfadiazine ? ?Recent consult visits:  ?None ID ? ?Hospital visits:  ?None since last coordination call ? ?Medications: ?Outpatient Encounter Medications as of 02/12/2022  ?Medication Sig Note  ? acetaminophen (TYLENOL) 325 MG tablet Take 325-650 mg by mouth daily as needed for mild pain or headache.   ? amoxicillin (AMOXIL) 500 MG tablet Take 4 tablets (2,000 mg total) by mouth as directed. 1 hour prior to dental work including cleanings (Patient taking differently: Take 2,000 mg by mouth See admin instructions. Take 4 tablets (2000 mg) by mouth 1 hour prior to dental work including cleanings)   ? ASPERCREME LIDOCAINE EX Apply 1 application topically 5 (five) times daily as needed (pain).   ? aspirin 81 MG tablet Take 81 mg by mouth daily.   ? atorvastatin (LIPITOR) 80 MG tablet Take 1 tablet (80 mg total) by mouth daily.   ? citalopram (CELEXA) 20 MG tablet Take 1 tablet (20 mg total) by mouth daily.   ? collagenase (SANTYL) ointment Apply 1 application topically 3 (three) times daily. Apply to sacrum   ? Continuous Blood Gluc Sensor (FREESTYLE LIBRE SENSOR SYSTEM) MISC Use to monitor sugars   ? esomeprazole (NEXIUM) 20 MG capsule Take 1 capsule (20 mg total) by mouth daily at 12 noon. (Patient taking differently: Take 20 mg by mouth every morning.) 07/09/2021: OTC  ? ezetimibe (ZETIA) 10 MG tablet TAKE 1 TABLET BY MOUTH  DAILY   ? gabapentin (NEURONTIN) 300 MG capsule Take 1 capsule (300 mg total) by mouth 2 (two) times daily.   ? glucose blood  (ONETOUCH VERIO) test strip USE AS DIRECTED   ? insulin degludec (TRESIBA FLEXTOUCH) 200 UNIT/ML FlexTouch Pen Inject 44 Units into the skin at bedtime.   ? Insulin Pen Needle 31G X 8 MM MISC Use to inject insulin four times daily E11.41   ? metFORMIN (GLUCOPHAGE) 500 MG tablet Take 1 tablet (500 mg total) by mouth 2 (two) times daily with a meal.   ? metoprolol tartrate (LOPRESSOR) 25 MG tablet Take 0.5 tablets (12.5 mg total) by mouth 2 (two) times daily.   ? Naphazoline HCl (CLEAR EYES OP) Place 1 drop into both eyes daily as needed (dry eyes/itching).   ? nystatin (MYCOSTATIN/NYSTOP) powder Apply 1 application topically 3 (three) times daily.   ? Potassium Chloride ER 20 MEQ TBCR Take 20 mEq by mouth daily.   ? Semaglutide (RYBELSUS) 14 MG TABS Take 14 mg by mouth daily.   ? silver sulfADIAZINE (SILVADENE) 1 % cream Apply 1 application topically daily.   ? sodium chloride (OCEAN) 0.65 % SOLN nasal spray Place 1 spray into both nostrils 4 (four) times daily. (Patient taking differently: Place 1 spray into both nostrils 4 (four) times daily as needed for congestion.)   ? torsemide (DEMADEX) 20 MG tablet Take 1 tablet (20 mg total) by mouth daily.   ? ?No facility-administered encounter medications on file as of 02/12/2022.  ? ?Recent Relevant Labs: ?Lab Results  ?Component Value Date/Time  ? HGBA1C 9.5 (A) 11/19/2021 01:45 PM  ? HGBA1C  9.8 (H) 06/12/2021 02:36 PM  ? HGBA1C 12.6 (H) 03/14/2021 05:06 PM  ? HGBA1C 11.2 03/20/2020 11:40 AM  ? HGBA1C 11.2 (A) 03/20/2020 11:40 AM  ? HGBA1C 11.2 (A) 03/20/2020 11:40 AM  ? MICROALBUR 20.4 (H) 03/20/2020 11:57 AM  ? MICROALBUR 12.1 (H) 05/10/2019 10:50 AM  ?  ?Kidney Function ?Lab Results  ?Component Value Date/Time  ? CREATININE 0.67 07/13/2021 02:31 AM  ? CREATININE 0.90 07/11/2021 01:57 AM  ? CREATININE 0.63 02/09/2013 02:47 PM  ? GFR 52.42 (L) 06/12/2021 02:36 PM  ? GFRNONAA >60 07/13/2021 02:31 AM  ? GFRNONAA >89 02/09/2013 02:47 PM  ? GFRAA 99 06/30/2020 09:09 AM  ?  GFRAA >89 02/09/2013 02:47 PM  ? ?Reviewed chart for medication changes and drug therapy problems ahead of medication adherence call. ? ?Attempted to contact patient x 3 for medication review and health check, unable to reach patient, left voicemails to return call. ? ?  ?Current antihyperglycemic regimen:  ?Metformin 500 mg BID ?Tresiba - 45 units daily  ?Rybelsus 14 mg daily - takes it after eating ?Freestyle Libre 2 ? ?What recent interventions/DTPs have been made to improve glycemic control:  ?None noted ? ?Have there been any recent hospitalizations or ED visits since last visit with CPP? No ? ? ?Adherence Review: ?Is the patient currently on a STATIN medication? Yes ?Is the patient currently on ACE/ARB medication? No ?Does the patient have >5 day gap between last estimated fill dates? No ? ? ?Care Gaps: ?Colonoscopy-07/01/12 ?Diabetic Foot Exam-11/19/21 ?Mammogram-04/26/21 ?Ophthalmology-03/16/20 ?Dexa Scan - NA ?Annual Well Visit - 11/12/18 ?Micro albumin-NA ?Hemoglobin A1c- 11/19/21 ? ?Star Rating Drugs: ?Metformin 500 mg-last fill 01/16/22 ?Atorvastatin 80 mg-last fill 11/19/21 30 ds ? ?[name] ?Clinical Pharmacist Assistant ?[phone]  ?

## 2022-03-06 ENCOUNTER — Telehealth: Payer: Medicare Other | Admitting: Internal Medicine

## 2022-03-06 ENCOUNTER — Other Ambulatory Visit: Payer: Self-pay

## 2022-03-12 ENCOUNTER — Other Ambulatory Visit: Payer: Self-pay | Admitting: Internal Medicine

## 2022-03-26 ENCOUNTER — Telehealth: Payer: Self-pay

## 2022-03-26 NOTE — Progress Notes (Signed)
? ? ?Chronic Care Management ?Pharmacy Assistant  ? ?Name: [name]  MRN: [id number] DOB: 05-13-1946 ? ? ?Reason for Encounter: Disease State ?  ?Conditions to be addressed/monitored: ?DMII ? ? ?Recent office visits:  ?None since the last coordination call ? ?Recent consult visits:  ?None since the last coordination call ? ?Hospital visits:  ?None since last coordination call ? ?Medications: ?Outpatient Encounter Medications as of 03/26/2022  ?Medication Sig Note  ? acetaminophen (TYLENOL) 325 MG tablet Take 325-650 mg by mouth daily as needed for mild pain or headache.   ? amoxicillin (AMOXIL) 500 MG tablet Take 4 tablets (2,000 mg total) by mouth as directed. 1 hour prior to dental work including cleanings (Patient taking differently: Take 2,000 mg by mouth See admin instructions. Take 4 tablets (2000 mg) by mouth 1 hour prior to dental work including cleanings)   ? ASPERCREME LIDOCAINE EX Apply 1 application topically 5 (five) times daily as needed (pain).   ? aspirin 81 MG tablet Take 81 mg by mouth daily.   ? atorvastatin (LIPITOR) 80 MG tablet Take 1 tablet (80 mg total) by mouth daily.   ? citalopram (CELEXA) 20 MG tablet Take 1 tablet (20 mg total) by mouth daily.   ? collagenase (SANTYL) ointment Apply 1 application topically 3 (three) times daily. Apply to sacrum   ? Continuous Blood Gluc Sensor (FREESTYLE LIBRE SENSOR SYSTEM) MISC Use to monitor sugars   ? esomeprazole (NEXIUM) 20 MG capsule Take 1 capsule (20 mg total) by mouth daily at 12 noon. (Patient taking differently: Take 20 mg by mouth every morning.) 07/09/2021: OTC  ? ezetimibe (ZETIA) 10 MG tablet TAKE 1 TABLET BY MOUTH  DAILY   ? gabapentin (NEURONTIN) 300 MG capsule Take 1 capsule (300 mg total) by mouth 2 (two) times daily.   ? insulin degludec (TRESIBA FLEXTOUCH) 200 UNIT/ML FlexTouch Pen Inject 44 Units into the skin at bedtime.   ? Insulin Pen Needle 31G X 8 MM MISC Use to inject insulin four times daily E11.41   ? metFORMIN  (GLUCOPHAGE) 500 MG tablet Take 1 tablet (500 mg total) by mouth 2 (two) times daily with a meal.   ? metoprolol tartrate (LOPRESSOR) 25 MG tablet Take 0.5 tablets (12.5 mg total) by mouth 2 (two) times daily.   ? Naphazoline HCl (CLEAR EYES OP) Place 1 drop into both eyes daily as needed (dry eyes/itching).   ? nystatin (MYCOSTATIN/NYSTOP) powder Apply 1 application topically 3 (three) times daily.   ? ONETOUCH VERIO test strip USE AS DIRECTED   ? Potassium Chloride ER 20 MEQ TBCR Take 20 mEq by mouth daily.   ? Semaglutide (RYBELSUS) 14 MG TABS Take 14 mg by mouth daily.   ? silver sulfADIAZINE (SILVADENE) 1 % cream Apply 1 application topically daily.   ? sodium chloride (OCEAN) 0.65 % SOLN nasal spray Place 1 spray into both nostrils 4 (four) times daily. (Patient taking differently: Place 1 spray into both nostrils 4 (four) times daily as needed for congestion.)   ? torsemide (DEMADEX) 20 MG tablet Take 1 tablet (20 mg total) by mouth daily.   ? ?No facility-administered encounter medications on file as of 03/26/2022.  ? ?Recent Relevant Labs: ?Lab Results  ?Component Value Date/Time  ? HGBA1C 9.5 (A) 11/19/2021 01:45 PM  ? HGBA1C 9.8 (H) 06/12/2021 02:36 PM  ? HGBA1C 12.6 (H) 03/14/2021 05:06 PM  ? HGBA1C 11.2 03/20/2020 11:40 AM  ? HGBA1C 11.2 (A) 03/20/2020 11:40 AM  ? HGBA1C  11.2 (A) 03/20/2020 11:40 AM  ? MICROALBUR 20.4 (H) 03/20/2020 11:57 AM  ? MICROALBUR 12.1 (H) 05/10/2019 10:50 AM  ?  ?Kidney Function ?Lab Results  ?Component Value Date/Time  ? CREATININE 0.67 07/13/2021 02:31 AM  ? CREATININE 0.90 07/11/2021 01:57 AM  ? CREATININE 0.63 02/09/2013 02:47 PM  ? GFR 52.42 (L) 06/12/2021 02:36 PM  ? GFRNONAA >60 07/13/2021 02:31 AM  ? GFRNONAA >89 02/09/2013 02:47 PM  ? GFRAA 99 06/30/2020 09:09 AM  ? GFRAA >89 02/09/2013 02:47 PM  ? ?Reviewed chart for medication changes and drug therapy problems ahead of medication adherence call. ? ?Attempted to contact patient x 3 for medication review and health  check, unable to reach patient, left voicemails to return call. ? ?  ?Current antihyperglycemic regimen:  ?Metformin 500 mg ?Rybelsus 14 mg ?Tresiba 200 mg ? ?What recent interventions/DTPs have been made to improve glycemic control:  ?None since last coordination call ? ?Have there been any recent hospitalizations or ED visits since last visit with CPP? No, not since last coordination call ? ? ?Adherence Review: ?Is the patient currently on a STATIN medication? Yes ?Is the patient currently on ACE/ARB medication? No ?Does the patient have >5 day gap between last estimated fill dates? Yes ? ? ?Care Gaps: ?Colonoscopy-07/01/12 ?Diabetic Foot Exam-11/19/21 ?Mammogram-04/26/21 ?Ophthalmology-03/16/20 ?Dexa Scan - NA ?Annual Well Visit - 11/12/18 ?Micro albumin-NA ?Hemoglobin A1c- 11/19/21 ?  ?Star Rating Drugs: ?Metformin 500 mg-last fill 01/16/22 ?Atorvastatin 80 mg-last fill 11/19/21 30 ds ?  ?[name] ?Clinical Pharmacist Assistant ?[phone]  ? ?

## 2022-03-29 ENCOUNTER — Other Ambulatory Visit: Payer: Self-pay | Admitting: Internal Medicine

## 2022-03-29 DIAGNOSIS — E1169 Type 2 diabetes mellitus with other specified complication: Secondary | ICD-10-CM

## 2022-03-29 MED ORDER — ATORVASTATIN CALCIUM 80 MG PO TABS: 80.0000 mg | ORAL_TABLET | Freq: Every day | ORAL | 3 refills | Status: AC

## 2022-04-04 ENCOUNTER — Telehealth: Payer: Self-pay

## 2022-04-04 DIAGNOSIS — I872 Venous insufficiency (chronic) (peripheral): Secondary | ICD-10-CM

## 2022-04-04 DIAGNOSIS — E11622 Type 2 diabetes mellitus with other skin ulcer: Secondary | ICD-10-CM

## 2022-04-04 NOTE — Telephone Encounter (Signed)
Pt calling to report that her legs are swollen and starting to leak again. Pt is asking for a referral to wound care to have them wrap her legs. Pt states that she has the water pill but she cant make it to the bathroom that often and she has been experiencing incontinence moments.  ?Pt states that she cant pick her feet up because they are to heavy to lift.  ? ?Please advise   ?

## 2022-04-05 NOTE — Telephone Encounter (Signed)
Is she asking for home health to come wrap her legs or to go to wound care for her legs? If requesting home health would need either video or in person visit to be ordered. We can do wound center referral is that is what she is requesting.  ?

## 2022-04-08 NOTE — Telephone Encounter (Signed)
Referral placed.

## 2022-04-08 NOTE — Telephone Encounter (Signed)
Spoke with the patient and she stated that she would like a referral to wound care.  ?

## 2022-04-08 NOTE — Addendum Note (Signed)
Addended by: Hillard Danker A on: 04/08/2022 02:49 PM ? ? Modules accepted: Orders ? ?

## 2022-04-11 ENCOUNTER — Other Ambulatory Visit: Payer: Self-pay | Admitting: Internal Medicine

## 2022-04-24 ENCOUNTER — Ambulatory Visit (INDEPENDENT_AMBULATORY_CARE_PROVIDER_SITE_OTHER): Payer: Medicare Other | Admitting: Family

## 2022-04-24 ENCOUNTER — Encounter: Payer: Self-pay | Admitting: Family

## 2022-04-24 VITALS — BP 118/62 | HR 91 | Temp 97.3°F | Resp 20 | Ht 64.0 in | Wt 189.0 lb

## 2022-04-24 DIAGNOSIS — Z7689 Persons encountering health services in other specified circumstances: Secondary | ICD-10-CM | POA: Diagnosis not present

## 2022-04-24 DIAGNOSIS — B372 Candidiasis of skin and nail: Secondary | ICD-10-CM

## 2022-04-24 DIAGNOSIS — F3341 Major depressive disorder, recurrent, in partial remission: Secondary | ICD-10-CM

## 2022-04-24 DIAGNOSIS — R600 Localized edema: Secondary | ICD-10-CM | POA: Diagnosis not present

## 2022-04-24 DIAGNOSIS — E1142 Type 2 diabetes mellitus with diabetic polyneuropathy: Secondary | ICD-10-CM | POA: Diagnosis not present

## 2022-04-24 DIAGNOSIS — E1169 Type 2 diabetes mellitus with other specified complication: Secondary | ICD-10-CM | POA: Diagnosis not present

## 2022-04-24 DIAGNOSIS — E11622 Type 2 diabetes mellitus with other skin ulcer: Secondary | ICD-10-CM

## 2022-04-24 DIAGNOSIS — K219 Gastro-esophageal reflux disease without esophagitis: Secondary | ICD-10-CM | POA: Diagnosis not present

## 2022-04-24 DIAGNOSIS — E1122 Type 2 diabetes mellitus with diabetic chronic kidney disease: Secondary | ICD-10-CM

## 2022-04-24 DIAGNOSIS — L602 Onychogryphosis: Secondary | ICD-10-CM | POA: Diagnosis not present

## 2022-04-24 DIAGNOSIS — L97921 Non-pressure chronic ulcer of unspecified part of left lower leg limited to breakdown of skin: Secondary | ICD-10-CM

## 2022-04-24 DIAGNOSIS — R269 Unspecified abnormalities of gait and mobility: Secondary | ICD-10-CM | POA: Diagnosis not present

## 2022-04-24 DIAGNOSIS — N1831 Chronic kidney disease, stage 3a: Secondary | ICD-10-CM | POA: Diagnosis not present

## 2022-04-24 DIAGNOSIS — Z794 Long term (current) use of insulin: Secondary | ICD-10-CM

## 2022-04-24 DIAGNOSIS — E785 Hyperlipidemia, unspecified: Secondary | ICD-10-CM

## 2022-04-24 DIAGNOSIS — I10 Essential (primary) hypertension: Secondary | ICD-10-CM

## 2022-04-24 MED ORDER — NYSTATIN 100000 UNIT/GM EX POWD: 1.0000 "application " | Freq: Three times a day (TID) | CUTANEOUS | 3 refills | Status: DC
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
## 2022-04-24 NOTE — Patient Instructions (Signed)
-   Cleanse lower extremities with saline,pat dry,apply Santyl to open wound bed and wrap leg with three layer wraps.change dressing every 3 days. ?

## 2022-04-25 ENCOUNTER — Telehealth: Payer: Self-pay

## 2022-04-25 LAB — CBC WITH DIFFERENTIAL/PLATELET
Absolute Monocytes: 672 cells/uL (ref 200–950)
Basophils Absolute: 72 cells/uL (ref 0–200)
Basophils Relative: 0.9 %
Eosinophils Absolute: 360 cells/uL (ref 15–500)
Eosinophils Relative: 4.5 %
HCT: 47.1 % — ABNORMAL HIGH (ref 35.0–45.0)
Hemoglobin: 15 g/dL (ref 11.7–15.5)
Lymphs Abs: 1304 cells/uL (ref 850–3900)
MCH: 27.6 pg (ref 27.0–33.0)
MCHC: 31.8 g/dL — ABNORMAL LOW (ref 32.0–36.0)
MCV: 86.6 fL (ref 80.0–100.0)
MPV: 11.6 fL (ref 7.5–12.5)
Monocytes Relative: 8.4 %
Neutro Abs: 5592 cells/uL (ref 1500–7800)
Neutrophils Relative %: 69.9 %
Platelets: 182 10*3/uL (ref 140–400)
RBC: 5.44 10*6/uL — ABNORMAL HIGH (ref 3.80–5.10)
RDW: 12.4 % (ref 11.0–15.0)
Total Lymphocyte: 16.3 %
WBC: 8 10*3/uL (ref 3.8–10.8)

## 2022-04-25 LAB — COMPLETE METABOLIC PANEL WITH GFR
AG Ratio: 0.9 (calc) — ABNORMAL LOW (ref 1.0–2.5)
ALT: 16 U/L (ref 6–29)
AST: 16 U/L (ref 10–35)
Albumin: 3.6 g/dL (ref 3.6–5.1)
Alkaline phosphatase (APISO): 151 U/L (ref 37–153)
BUN: 23 mg/dL (ref 7–25)
CO2: 26 mmol/L (ref 20–32)
Calcium: 9.1 mg/dL (ref 8.6–10.4)
Chloride: 94 mmol/L — ABNORMAL LOW (ref 98–110)
Creat: 0.87 mg/dL (ref 0.60–1.00)
Globulin: 4 g/dL (calc) — ABNORMAL HIGH (ref 1.9–3.7)
Glucose, Bld: 442 mg/dL — ABNORMAL HIGH (ref 65–99)
Potassium: 4.6 mmol/L (ref 3.5–5.3)
Sodium: 131 mmol/L — ABNORMAL LOW (ref 135–146)
Total Bilirubin: 0.5 mg/dL (ref 0.2–1.2)
Total Protein: 7.6 g/dL (ref 6.1–8.1)
eGFR: 69 mL/min/{1.73_m2} (ref >=60)

## 2022-04-25 LAB — LIPID PANEL
Cholesterol: 204 mg/dL — ABNORMAL HIGH (ref <200)
HDL: 43 mg/dL — ABNORMAL LOW (ref >=50)
LDL Cholesterol (Calc): 131 mg/dL (calc) — ABNORMAL HIGH
Non-HDL Cholesterol (Calc): 161 mg/dL (calc) — ABNORMAL HIGH (ref <130)
Total CHOL/HDL Ratio: 4.7 (calc) (ref <5.0)
Triglycerides: 163 mg/dL — ABNORMAL HIGH (ref <150)

## 2022-04-25 LAB — TSH: TSH: 5.06 mIU/L — ABNORMAL HIGH (ref 0.40–4.50)

## 2022-04-25 LAB — HEMOGLOBIN A1C: Hgb A1c MFr Bld: >14 % of total Hgb — ABNORMAL HIGH (ref <5.7)
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
## 2022-05-02 ENCOUNTER — Encounter: Payer: Self-pay | Admitting: Family

## 2022-05-02 ENCOUNTER — Ambulatory Visit (INDEPENDENT_AMBULATORY_CARE_PROVIDER_SITE_OTHER): Payer: Medicare Other | Admitting: Family

## 2022-05-02 VITALS — BP 110/60 | HR 54 | Temp 96.9°F | Resp 16 | Ht 64.0 in | Wt 184.7 lb

## 2022-05-02 DIAGNOSIS — Z794 Long term (current) use of insulin: Secondary | ICD-10-CM | POA: Diagnosis not present

## 2022-05-02 DIAGNOSIS — N1831 Chronic kidney disease, stage 3a: Secondary | ICD-10-CM

## 2022-05-02 DIAGNOSIS — E1122 Type 2 diabetes mellitus with diabetic chronic kidney disease: Secondary | ICD-10-CM

## 2022-05-02 DIAGNOSIS — L97921 Non-pressure chronic ulcer of unspecified part of left lower leg limited to breakdown of skin: Secondary | ICD-10-CM | POA: Diagnosis not present

## 2022-05-02 DIAGNOSIS — E11622 Type 2 diabetes mellitus with other skin ulcer: Secondary | ICD-10-CM

## 2022-05-02 DIAGNOSIS — R600 Localized edema: Secondary | ICD-10-CM

## 2022-05-02 MED ORDER — INSULIN PEN NEEDLE 31G X 8 MM MISC: 3 refills | Status: AC

## 2022-05-02 MED ORDER — COLLAGENASE 250 UNIT/GM EX OINT: 1.0000 "application " | TOPICAL_OINTMENT | Freq: Three times a day (TID) | CUTANEOUS | 3 refills | Status: DC

## 2022-05-02 MED ORDER — TRESIBA FLEXTOUCH 200 UNIT/ML ~~LOC~~ SOPN: 44.0000 [IU] | PEN_INJECTOR | Freq: Every day | SUBCUTANEOUS | 1 refills | Status: DC

## 2022-05-02 MED ORDER — FREESTYLE LIBRE SENSOR SYSTEM MISC: 0 refills | Status: AC

## 2022-05-02 NOTE — Progress Notes (Signed)
Provider: Marlowe Sax FNP-C  [name], Cynthia Bucks, NP  Patient Care Team: [name], Cynthia Bucks, NP as PCP - General (Family Medicine) Cynthia Latch, MD as PCP - Cardiology (Cardiology) [name], [name], [name] (Optometry) [name], [name] as Pharmacist (Pharmacist)  Extended Emergency Contact Information Primary Emergency Contact: [name] Mobile Phone: [phone] Relation: Relative Secondary Emergency Contact: [name] Phone: [phone] Relation: Niece  Code Status:  Full Code  Goals of care: Advanced Directive information    05/02/2022   10:19 AM  Advanced Directives  Does Patient Have a Medical Advance Directive? No  Would patient like information on creating a medical advance directive? No - Patient declined     Chief Complaint  Patient presents with   Acute Visit    Discuss recent labs.     HPI:  Pt is a 76 y.o. female seen today for an acute visit to discuss lab results.she is here with her Friend who is a neighbor that assist her at home. Lab work reviewed and discussed today.  Unremarkable except total cholesterol 204, triglycerides 163, HDL 43, LDL 131 and hemoglobin A1c is greater than 14 with glucose 442 fasting. Patient states has been taking atorvastatin 80 mg daily.  Not sure if using [name] the same is she was given Lantus from the pharmacy.  [name], [name] contacted [name] for pharmacy stating the last time patient filled Lantus was 2011 [name] in November 2022 and [facility] does not feel patient's insulin.  Still unclear why the patient has been using [name] 44 units.  Also not checking her blood sugars stated does not know where her [name] for freestyle libre and the machine tools screen blood sugars. Patient's friend stated patient's medications are all over the place in the house with several bottles will need help with the medications. Also patient states recent ordered home health nurse for for  wound care has not conducted patient.  [facility].  Upon chart review referral noted to wound center states tried to contact patient 3 times but patient did not answer phone voice message left and referral was canceled since they tried to reach patient 3 times. Patient states unable to check voice messages on her iPhone.  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 Cyndia Bent and Burt Knack   Venous insufficiency    Ventral hernia    Past Surgical History:  Procedure Laterality Date   ABDOMINAL HYSTERECTOMY  2001   TAH-BSO   CHOLECYSTECTOMY  2001   COLONOSCOPY  2013   diverticulosis    ESOPHAGOGASTRODUODENOSCOPY  2013   normal    INCISIONAL HERNIA REPAIR     RIGHT/LEFT HEART CATH AND CORONARY ANGIOGRAPHY N/A 05/09/2020   Procedure: RIGHT/LEFT HEART CATH AND CORONARY ANGIOGRAPHY;  Surgeon:  Jettie Booze, MD;  Location: [facility];  Service: Cardiovascular;  Laterality: N/A;   TEE WITHOUT CARDIOVERSION N/A 05/17/2020   Procedure: TRANSESOPHAGEAL ECHOCARDIOGRAM (TEE);  Surgeon: Sherren Mocha, MD;  Location: [facility];  Service: Open Heart Surgery;  Laterality: N/A;   TONSILLECTOMY     TRANSCATHETER AORTIC VALVE REPLACEMENT, TRANSFEMORAL N/A 05/17/2020   Procedure: TRANSCATHETER AORTIC VALVE REPLACEMENT, TRANSFEMORAL;  Surgeon: Sherren Mocha, MD;  Location: [facility];  Service: Open Heart Surgery;  Laterality: N/A;    Allergies  Allergen Reactions   Morphine Other (See Comments)    'took me out of this world' I had to be resuscitated   Codeine Sulfate Nausea Only and Other (See Comments)    GI upset and pain   Demerol [Meperidine] Nausea And Vomiting   Propoxyphene Hcl Other (See Comments)    Darvocet caused sick headache    Outpatient Encounter Medications as of 05/02/2022  Medication Sig   acetaminophen (TYLENOL) 325 MG tablet Take 325-650 mg by mouth daily as needed for mild pain or headache.   ASPERCREME LIDOCAINE EX Apply 1 application topically 5 (five) times daily as needed (pain).   aspirin 81 MG tablet Take 81 mg by mouth daily.   atorvastatin (LIPITOR) 80 MG tablet Take 1 tablet (80 mg total) by mouth daily.   citalopram (CELEXA) 20 MG tablet Take 1 tablet (20 mg total) by mouth daily.   collagenase (SANTYL) ointment Apply 1 application topically 3 (three) times daily. Apply to sacrum   Continuous Blood Gluc Sensor (FREESTYLE LIBRE SENSOR SYSTEM) MISC Use to monitor sugars   esomeprazole (NEXIUM) 20 MG capsule Take 1 capsule (20 mg total) by mouth daily at 12 noon.   ezetimibe (ZETIA) 10 MG tablet TAKE 1 TABLET BY MOUTH  DAILY   gabapentin (NEURONTIN) 300 MG capsule TAKE 1 CAPSULE BY MOUTH 3  TIMES DAILY   insulin degludec (TRESIBA FLEXTOUCH) 200 UNIT/ML FlexTouch Pen Inject 44 Units into the skin at bedtime.   Insulin Pen Needle 31G X 8 MM MISC Use to inject insulin four times daily E11.41   metFORMIN (GLUCOPHAGE) 500 MG tablet Take 1 tablet (500 mg total) by mouth 2 (two) times daily with a meal.   metoprolol tartrate (LOPRESSOR) 25 MG tablet Take 0.5 tablets (12.5 mg total) by mouth 2 (two) times daily.   Naphazoline HCl (CLEAR EYES OP) Place 1 drop into both eyes daily as needed (dry eyes/itching).   nystatin (MYCOSTATIN/NYSTOP) powder Apply 1 application. topically 3 (three) times daily.   ONETOUCH VERIO test strip  USE AS DIRECTED   Semaglutide (RYBELSUS) 14 MG TABS Take 14 mg by mouth daily.   silver sulfADIAZINE (SILVADENE) 1 % cream Apply 1 application topically daily.   sodium chloride (OCEAN) 0.65 % SOLN nasal spray Place 1 spray into both nostrils 4 (four) times daily.   torsemide (DEMADEX) 20 MG tablet Take 1 tablet (20 mg total) by mouth daily.   Potassium Chloride ER 20 MEQ TBCR Take 20 mEq by mouth daily.   No facility-administered encounter medications on file as of 05/02/2022.    Review of Systems  Constitutional:  Negative for appetite change, chills, fatigue, fever and unexpected weight change.  Eyes:  Negative for pain, discharge, redness, itching and visual disturbance.  Respiratory:  Negative for cough, chest tightness, shortness of breath and wheezing.   Cardiovascular:  Positive for leg swelling. Negative for chest pain and palpitations.  Gastrointestinal:  Negative for abdominal distention, abdominal  pain, blood in stool, constipation, diarrhea, nausea and vomiting.  Endocrine: Negative for cold intolerance, heat intolerance, polydipsia, polyphagia and polyuria.  Genitourinary:  Negative for difficulty urinating, dysuria, flank pain, frequency and urgency.  Musculoskeletal:  Positive for gait problem. Negative for arthralgias, back pain, joint swelling, myalgias, neck pain and neck stiffness.  Skin:  Positive for wound. Negative for color change, pallor and rash.       Bilateral lower extremity multiple wounds  Psychiatric/Behavioral:  Negative for agitation, behavioral problems, confusion, hallucinations and sleep disturbance. The patient is not nervous/anxious.        Memory lapse   Immunization History  Administered Date(s) Administered   Fluad Quad(high Dose 65+) 08/30/2019, 09/09/2020, 08/14/2021   Influenza Split 08/27/2012   Influenza Whole 09/27/2005, 09/18/2007, 08/29/2009, 11/09/2010   Influenza, High Dose Seasonal PF 10/21/2017, 11/12/2018   Influenza,inj,Quad PF,6+  Mos 10/22/2013, 12/20/2014, 10/09/2015, 07/29/2016   Moderna SARS-COV2 Booster Vaccination 07/13/2021   PFIZER(Purple Top)SARS-COV-2 Vaccination 02/03/2020, 02/29/2020   Pneumococcal Conjugate-13 07/23/2016   Pneumococcal Polysaccharide-23 02/09/2013, 12/20/2014   Tdap 08/27/2012   Zoster, Live 02/19/2013   Pertinent  Health Maintenance Due  Topic Date Due   OPHTHALMOLOGY EXAM  03/16/2021   URINE MICROALBUMIN  03/20/2021   COLONOSCOPY (Pts 45-24yrs Insurance coverage will need to be confirmed)  07/01/2022   INFLUENZA VACCINE  07/09/2022   HEMOGLOBIN A1C  07/25/2022   FOOT EXAM  11/19/2022   LIPID PANEL  04/25/2023   DEXA SCAN  Completed      07/13/2021    8:30 AM 07/13/2021    8:30 PM 10/08/2021   10:48 AM 04/24/2022   10:43 AM 05/02/2022   10:18 AM  Fall Risk  Falls in the past year?   0 0 0  Was there an injury with Fall?   1 0 0  Fall Risk Category Calculator   1 0 0  Fall Risk Category   Low Low Low  Patient Fall Risk Level High fall risk High fall risk   Low fall risk  Patient at Risk for Falls Due to   Impaired balance/gait No Fall Risks No Fall Risks  Fall risk Follow up    Falls evaluation completed Falls evaluation completed   Functional Status Survey:    Vitals:   05/02/22 1011  BP: 110/60  Pulse: (!) 54  Resp: 16  Temp: (!) 96.9 F (36.1 C)  SpO2: 99%  Weight: 184 lb 11.2 oz (83.8 kg)  Height: 5\' 4"  (1.626 m)   Body mass index is 31.7 kg/m. Physical Exam Vitals reviewed.  Constitutional:      General: She is not in acute distress.    Appearance: Normal appearance. She is normal weight. She is not ill-appearing or diaphoretic.  HENT:     Head: Normocephalic.     Mouth/Throat:     Mouth: Mucous membranes are moist.     Pharynx: Oropharynx is clear. No oropharyngeal exudate or posterior oropharyngeal erythema.  Eyes:     General: No scleral icterus.       Right eye: No discharge.        Left eye: No discharge.     Conjunctiva/sclera: Conjunctivae  normal.     Pupils: Pupils are equal, round, and reactive to light.  Cardiovascular:     Rate and Rhythm: Normal rate and regular rhythm.     Pulses: Normal pulses.     Heart sounds: Normal heart sounds. No murmur heard.   No friction rub. No gallop.  Pulmonary:  Effort: Pulmonary effort is normal. No respiratory distress.     Breath sounds: Normal breath sounds. No wheezing, rhonchi or rales.  Chest:     Chest wall: No tenderness.  Abdominal:     General: Bowel sounds are normal. There is no distension.     Palpations: Abdomen is soft. There is no mass.     Tenderness: There is no abdominal tenderness. There is no right CVA tenderness, left CVA tenderness, guarding or rebound.  Musculoskeletal:        General: No swelling or tenderness. Normal range of motion.     Right lower leg: Edema present.     Left lower leg: Edema present.  Skin:    General: Skin is warm and dry.     Coloration: Skin is not pale.     Findings: No erythema or rash.     Comments: Bilateral lower extremity multiple shallow wounds with serous drainage has decreased compared to recent visit diffuse edema noted.No Kerlix for leg wrap this visit.HHN to apply three layer wrap   Neurological:     Mental Status: She is alert and oriented to person, place, and time.     Cranial Nerves: No cranial nerve deficit.     Sensory: No sensory deficit.     Motor: No weakness.     Coordination: Coordination normal.     Gait: Gait abnormal.  Psychiatric:        Mood and Affect: Mood normal.        Speech: Speech normal.        Behavior: Behavior normal.    Labs reviewed: Recent Labs    07/11/21 0157 07/13/21 0231 04/24/22 1231  NA 137 136 131*  K 3.9 4.3 4.6  CL 102 101 94*  CO2 27 29 26   GLUCOSE 115* 105* 442*  BUN 26* 17 23  CREATININE 0.90 0.67 0.87  CALCIUM 8.3* 8.4* 9.1   Recent Labs    06/12/21 1436 07/09/21 1354 07/10/21 0212 04/24/22 1231  AST 25 22 24 16   ALT 45* 21 19 16   ALKPHOS 179* 128*  124  --   BILITOT 0.4 2.0* 0.7 0.5  PROT 7.6 6.3* 5.7* 7.6  ALBUMIN 3.8 2.4* 2.2*  --    Recent Labs    07/09/21 1354 07/10/21 0212 07/11/21 0157 04/24/22 1231  WBC 13.2* 10.2 9.2 8.0  NEUTROABS 10.3* 6.8  --  5,592  HGB 13.9 13.0 12.4 15.0  HCT 42.0 38.6 38.1 47.1*  MCV 84.8 83.5 85.6 86.6  PLT 216 217 189 182   Lab Results  Component Value Date   TSH 5.06 (H) 04/24/2022   Lab Results  Component Value Date   HGBA1C >14.0 (H) 04/24/2022   Lab Results  Component Value Date   CHOL 204 (H) 04/24/2022   HDL 43 (L) 04/24/2022   LDLCALC 131 (H) 04/24/2022   LDLDIRECT 110.0 11/12/2018   TRIG 163 (H) 04/24/2022   CHOLHDL 4.7 04/24/2022    Significant Diagnostic Results in last 30 days:  No results found.  Assessment/Plan 1. Type 2 diabetes mellitus with stage 3a chronic kidney disease, with long-term current use of insulin (HCC) Lab Results  Component Value Date   HGBA1C >14.0 (H) 04/24/2022  No home CBG for review.Has no free style libre to check CBG does know where the battery and machine are.  We will reorder freestyle libre.  Advised to check CBG and record on log provided this visit and bring log to visits in 2 weeks  for evaluation. Restart [name] since she has not been using it. -Continue on metformin and semaglutide - Continuous Blood Gluc Sensor ([place]) MISC; Use to monitor sugars  Dispense: 1 each; Refill: 0 - insulin degludec (TRESIBA FLEXTOUCH) 200 UNIT/ML FlexTouch Pen; Inject 44 Units into the skin at bedtime.  Dispense: 9 mL; Refill: 1 - Insulin Pen Needle 31G X 8 MM MISC; Use to inject insulin four times daily E11.41  Dispense: 100 each; Refill: 3 - Hemoglobin A1c; Future - TSH; Future - Lipid panel; Future - Ambulatory referral to Home Health: For medication management and diabetic education - AMB Referral to [place] Management: Assist with transportation  2. 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 topically 3 (three) times daily. Apply to sacrum  Dispense: 15 g; Refill: 3 - Ambulatory referral to Home Health  3. Edema of both lower extremities Home health nurse to apply 3 layer wraps to keep edema down wound management. - Ambulatory referral to [place] [facility] staff Communication: Reviewed plan of care with patient and friend who verbalized understanding  Labs/tests ordered:  - Hemoglobin A1c; Future - TSH; Future - Lipid panel; Future  Next Appointment: 2 weeks for follow-up blood sugars  Sandrea Hughs, NP

## 2022-05-02 NOTE — Patient Instructions (Addendum)
Please contact your local pharmacy, previous provider, or insurance carrier for vaccine/immunization records. Ensure that any procedures done outside of [facility] and Adult [facility] are faxed to [place] [phone] or you can sign release of records form at the front desk to keep your medical record updated.    Please check blood sugar at home at bring log to visit in 2 weeks.

## 2022-05-03 ENCOUNTER — Other Ambulatory Visit: Payer: Self-pay

## 2022-05-03 ENCOUNTER — Other Ambulatory Visit: Payer: Self-pay | Admitting: *Deleted

## 2022-05-03 NOTE — Patient Outreach (Signed)
[facility]) Care Management Telephonic RN Care Manager Note   05/03/2022 Name:  [name] MRN:  [id number] DOB:  June 30, 1946  Summary: [facility] Unsuccessful outreach Outreach attempt to the listed at the preferred outreach number in EPIC [phone] No answer. [facility] RN CM left HIPAA [facility] Portability and Accountability Act) compliant voicemail message along with CM's contact info.    Subjective: [name] is an 76 y.o. year old female who is a primary patient of [name], [name], NP. The care management team was consulted for assistance with care management and/or care coordination needs.    Telephonic RN Care Manager completed Telephone Visit today.   She was referred on 05/03/22 by her primary care provider (PCP), NP Dinah [name] for medication management for uncontrolled diabetes type 2 and transportation social determinants of health (SDOH) concerns   Objective:  Medications Reviewed Today     Reviewed by Dicky Doe, CMA (Certified Medical Assistant) on 05/02/22 at 1017  Med List Status: <None>   Medication Order Taking? Sig Documenting Provider Last Dose Status Informant  acetaminophen (TYLENOL) 325 MG tablet [id number] Yes Take 325-650 mg by mouth daily as needed for mild pain or headache. [provider] Taking Active Multiple Informants  ASPERCREME LIDOCAINE EX [id number] Yes Apply 1 application topically 5 (five) times daily as needed (pain). [provider] Taking Active Multiple Informants  aspirin 81 MG tablet [id number] Yes Take 81 mg by mouth daily. [provider] Taking Active Multiple Informants  atorvastatin (LIPITOR) 80 MG tablet [id number] Yes Take 1 tablet (80 mg total) by mouth daily. Myrlene Broker, MD Taking Active   citalopram (CELEXA) 20 MG tablet [id number] Yes Take 1 tablet (20 mg total) by mouth daily. Myrlene Broker, MD Taking Active   collagenase Melburn Popper) ointment  [id number] Yes Apply 1 application topically 3 (three) times daily. Apply to sacrum [provider] Taking Active   Continuous Blood Gluc Sensor (FREESTYLE LIBRE SENSOR SYSTEM) MISC [id number] Yes Use to monitor sugars Myrlene Broker, MD Taking Active   esomeprazole (NEXIUM) 20 MG capsule [id number] Yes Take 1 capsule (20 mg total) by mouth daily at 12 noon. Myrlene Broker, MD Taking Active            Med Note Haynes Dage, MELISSA Ellsworth Lennox Apr 24, 2022 10:41 AM)    ezetimibe (ZETIA) 10 MG tablet [id number] Yes TAKE 1 TABLET BY MOUTH  DAILY Myrlene Broker, MD Taking Active   gabapentin (NEURONTIN) 300 MG capsule [id number] Yes TAKE 1 CAPSULE BY MOUTH 3  TIMES DAILY Myrlene Broker, MD Taking Active   insulin degludec (TRESIBA FLEXTOUCH) 200 UNIT/ML FlexTouch Pen [id number] Yes Inject 44 Units into the skin at bedtime. Myrlene Broker, MD Taking Active   Insulin Pen Needle 31G X 8 MM MISC [id number] Yes Use to inject insulin four times daily E11.41 Myrlene Broker, MD Taking Active   metFORMIN (GLUCOPHAGE) 500 MG tablet [id number] Yes Take 1 tablet (500 mg total) by mouth 2 (two) times daily with a meal. Myrlene Broker, MD Taking Active   metoprolol tartrate (LOPRESSOR) 25 MG tablet [id number] Yes Take 0.5 tablets (12.5 mg total) by mouth 2 (two) times daily. Myrlene Broker, MD Taking Active   Naphazoline HCl (CLEAR EYES OP) [id number] Yes Place 1 drop into both eyes daily as needed (dry eyes/itching). [provider] Taking Active Multiple Informants  nystatin (MYCOSTATIN/NYSTOP) powder [id number] Yes Apply 1 application. topically  3 (three) times daily. [name], Donalee Citrin, NP Taking Active   Lum Babe test strip [id number] Yes USE AS DIRECTED Myrlene Broker, MD Taking Active   Potassium Chloride ER 20 MEQ TBCR [id number]  Take 20 mEq by mouth daily. Myrlene Broker, MD  Expired 11/03/21 2359   Semaglutide (RYBELSUS) 14 MG TABS  [id number] Yes Take 14 mg by mouth daily. Myrlene Broker, MD Taking Active   silver sulfADIAZINE (SILVADENE) 1 % cream [id number] Yes Apply 1 application topically daily. Myrlene Broker, MD Taking Active   sodium chloride (OCEAN) 0.65 % SOLN nasal spray [id number] Yes Place 1 spray into both nostrils 4 (four) times daily. Myrlene Broker, MD Taking Active   torsemide [facility]) 20 MG tablet [id number] Yes Take 1 tablet (20 mg total) by mouth daily. Myrlene Broker, MD Taking Active              SDOH:  (Social Determinants of Health) assessments and interventions performed:    Care Plan  Review of patient past medical history, allergies, medications, health status, including review of consultants reports, laboratory and other test data, was performed as part of comprehensive evaluation for care management services.   There are no care plans that you recently modified to display for this patient.    Plan: The patient has been provided with contact information for the care management team and has been advised to call with any health related questions or concerns.  The care management team will reach out to the patient again over the next 4-7 business days.  Chole Driver L. Noelle Penner, RN, BSN, CCM [facility] Telephonic Care Management Care Coordinator Office number [phone] [facility] number [phone] Fax number [phone]

## 2022-05-03 NOTE — Patient Outreach (Signed)
[facility]) Care Management Telephonic RN Care Manager Note   05/03/2022 Name:  [name] MRN:  [id number] DOB:  1946-01-22  Summary: Pt returned a call back to RN CM Wound care and home care have not called back  She confirms her legs hurt related to ulcers that leak  She reports she is having issue with wound care dressing  She is attending the wound care center near [facility] She is aware of a dressing supply demand concerns Has not checked cbg today  Has a lady clean her home  She fell this morning scratch left elbow black butt evaluated by ems thin skin  She has medical alert She used it today Reports a missing $1800 She is on a fixed income on SSI She reports she was previously in a facility Barrier is not good with  use of my chart prefer Her neighbor /female friend transport when she can  She is aware of [facility] transport reports billed x 3 $50 for missed transport services  Pcp provided transportation resources  Has issues with locking her back door - own her home  Has possible ulcer on bottom of foot  Had [facility] before  Need 2 aa batteries for her remote new roku remote  Had meals on wheels does not like th food taste tried mom meals  Make sandwiches no go to church any more  Up 0700 poor sleep up void 0400 leep her phone wih her  Recommendations/Changes made from today's visit: Initial screening  Allowed her to ventilate permission to call first  and repeat calls as she is slow and no use my chart well Mail assist needed from  Encouraged elevate legs and towels for weeping leg  Provided 24 hour nurse service  Subjective: [name] is an 76 y.o. year old female who is a primary patient of [name], [name], NP. The care management team was consulted for assistance with care management and/or care coordination needs.    Telephonic RN Care Manager completed Telephone Visit today.   Objective:  Medications Reviewed Today      Reviewed by Dicky Doe, CMA (Certified Medical Assistant) on 05/02/22 at 1017  Med List Status: <None>   Medication Order Taking? Sig Documenting Provider Last Dose Status Informant  acetaminophen (TYLENOL) 325 MG tablet [id number] Yes Take 325-650 mg by mouth daily as needed for mild pain or headache. [provider] Taking Active Multiple Informants  ASPERCREME LIDOCAINE EX [id number] Yes Apply 1 application topically 5 (five) times daily as needed (pain). [provider] Taking Active Multiple Informants  aspirin 81 MG tablet [id number] Yes Take 81 mg by mouth daily. [provider] Taking Active Multiple Informants  atorvastatin (LIPITOR) 80 MG tablet [id number] Yes Take 1 tablet (80 mg total) by mouth daily. Myrlene Broker, MD Taking Active   citalopram (CELEXA) 20 MG tablet [id number] Yes Take 1 tablet (20 mg total) by mouth daily. Myrlene Broker, MD Taking Active   collagenase Melburn Popper) ointment [id number] Yes Apply 1 application topically 3 (three) times daily. Apply to sacrum [provider] Taking Active   Continuous Blood Gluc Sensor (FREESTYLE LIBRE SENSOR SYSTEM) MISC [id number] Yes Use to monitor sugars Myrlene Broker, MD Taking Active   esomeprazole (NEXIUM) 20 MG capsule [id number] Yes Take 1 capsule (20 mg total) by mouth daily at 12 noon. Myrlene Broker, MD Taking Active            Med Note [facility], MELISSA  L   Wed Apr 24, 2022 10:41 AM)    ezetimibe (ZETIA) 10 MG tablet [id number] Yes TAKE 1 TABLET BY MOUTH  DAILY Myrlene Broker, MD Taking Active   gabapentin (NEURONTIN) 300 MG capsule [id number] Yes TAKE 1 CAPSULE BY MOUTH 3  TIMES DAILY Myrlene Broker, MD Taking Active   insulin degludec (TRESIBA FLEXTOUCH) 200 UNIT/ML FlexTouch Pen [id number] Yes Inject 44 Units into the skin at bedtime. Myrlene Broker, MD Taking Active   Insulin Pen Needle 31G X 8 MM MISC [id number] Yes Use to inject insulin four  times daily E11.41 Myrlene Broker, MD Taking Active   metFORMIN (GLUCOPHAGE) 500 MG tablet [id number] Yes Take 1 tablet (500 mg total) by mouth 2 (two) times daily with a meal. Myrlene Broker, MD Taking Active   metoprolol tartrate (LOPRESSOR) 25 MG tablet [id number] Yes Take 0.5 tablets (12.5 mg total) by mouth 2 (two) times daily. Myrlene Broker, MD Taking Active   Naphazoline HCl (CLEAR EYES OP) [id number] Yes Place 1 drop into both eyes daily as needed (dry eyes/itching). [provider] Taking Active Multiple Informants  nystatin (MYCOSTATIN/NYSTOP) powder [id number] Yes Apply 1 application. topically 3 (three) times daily. [name], Donalee Citrin, NP Taking Active   Lum Babe test strip [id number] Yes USE AS DIRECTED Myrlene Broker, MD Taking Active   Potassium Chloride ER 20 MEQ TBCR [id number]  Take 20 mEq by mouth daily. Myrlene Broker, MD  Expired 11/03/21 2359   Semaglutide (RYBELSUS) 14 MG TABS [id number] Yes Take 14 mg by mouth daily. Myrlene Broker, MD Taking Active   silver sulfADIAZINE (SILVADENE) 1 % cream [id number] Yes Apply 1 application topically daily. Myrlene Broker, MD Taking Active   sodium chloride (OCEAN) 0.65 % SOLN nasal spray [id number] Yes Place 1 spray into both nostrils 4 (four) times daily. Myrlene Broker, MD Taking Active   torsemide [facility]) 20 MG tablet [id number] Yes Take 1 tablet (20 mg total) by mouth daily. Myrlene Broker, MD Taking Active              SDOH:  (Social Determinants of Health) assessments and interventions performed:    Care Plan  Review of patient past medical history, allergies, medications, health status, including review of consultants reports, laboratory and other test data, was performed as part of comprehensive evaluation for care management services.   There are no care plans that you recently modified to display for this patient.    Plan: The patient has been  provided with contact information for the care management team and has been advised to call with any health related questions or concerns.  The care management team will reach out to the patient again over the next *** days.  Caylea Foronda L. Noelle Penner, RN, BSN, CCM [facility] Telephonic Care Management Care Coordinator Office number [phone] [facility] number [phone] Fax number [phone]

## 2022-05-03 NOTE — Patient Outreach (Signed)
[facility]) Care Management  05/03/2022  Natara Leisy 1946-05-28 [id number]   Received referral from patient's pcp for care management services for Medication management,uncontrolled Type 2 Diabetes Mellitus. Assigned patient to Joellyn Quails, RN care coordinator for follow up.  [place] Management Assistant [phone]

## 2022-05-04 DIAGNOSIS — K5790 Diverticulosis of intestine, part unspecified, without perforation or abscess without bleeding: Secondary | ICD-10-CM | POA: Diagnosis not present

## 2022-05-04 DIAGNOSIS — M7731 Calcaneal spur, right foot: Secondary | ICD-10-CM | POA: Diagnosis not present

## 2022-05-04 DIAGNOSIS — Z9181 History of falling: Secondary | ICD-10-CM | POA: Diagnosis not present

## 2022-05-04 DIAGNOSIS — L97821 Non-pressure chronic ulcer of other part of left lower leg limited to breakdown of skin: Secondary | ICD-10-CM | POA: Diagnosis not present

## 2022-05-04 DIAGNOSIS — R413 Other amnesia: Secondary | ICD-10-CM | POA: Diagnosis not present

## 2022-05-04 DIAGNOSIS — E1122 Type 2 diabetes mellitus with diabetic chronic kidney disease: Secondary | ICD-10-CM | POA: Diagnosis not present

## 2022-05-04 DIAGNOSIS — L97819 Non-pressure chronic ulcer of other part of right lower leg with unspecified severity: Secondary | ICD-10-CM | POA: Diagnosis not present

## 2022-05-04 DIAGNOSIS — E1142 Type 2 diabetes mellitus with diabetic polyneuropathy: Secondary | ICD-10-CM | POA: Diagnosis not present

## 2022-05-04 DIAGNOSIS — E11622 Type 2 diabetes mellitus with other skin ulcer: Secondary | ICD-10-CM | POA: Diagnosis not present

## 2022-05-04 DIAGNOSIS — M7531 Calcific tendinitis of right shoulder: Secondary | ICD-10-CM | POA: Diagnosis not present

## 2022-05-04 DIAGNOSIS — E1136 Type 2 diabetes mellitus with diabetic cataract: Secondary | ICD-10-CM | POA: Diagnosis not present

## 2022-05-04 DIAGNOSIS — H539 Unspecified visual disturbance: Secondary | ICD-10-CM | POA: Diagnosis not present

## 2022-05-04 DIAGNOSIS — I872 Venous insufficiency (chronic) (peripheral): Secondary | ICD-10-CM | POA: Diagnosis not present

## 2022-05-04 DIAGNOSIS — I5032 Chronic diastolic (congestive) heart failure: Secondary | ICD-10-CM | POA: Diagnosis not present

## 2022-05-04 DIAGNOSIS — N1831 Chronic kidney disease, stage 3a: Secondary | ICD-10-CM | POA: Diagnosis not present

## 2022-05-04 DIAGNOSIS — I890 Lymphedema, not elsewhere classified: Secondary | ICD-10-CM | POA: Diagnosis not present

## 2022-05-04 DIAGNOSIS — K219 Gastro-esophageal reflux disease without esophagitis: Secondary | ICD-10-CM | POA: Diagnosis not present

## 2022-05-04 DIAGNOSIS — Z4800 Encounter for change or removal of nonsurgical wound dressing: Secondary | ICD-10-CM | POA: Diagnosis not present

## 2022-05-04 DIAGNOSIS — E785 Hyperlipidemia, unspecified: Secondary | ICD-10-CM | POA: Diagnosis not present

## 2022-05-04 DIAGNOSIS — I110 Hypertensive heart disease with heart failure: Secondary | ICD-10-CM | POA: Diagnosis not present

## 2022-05-04 DIAGNOSIS — M7660 Achilles tendinitis, unspecified leg: Secondary | ICD-10-CM | POA: Diagnosis not present

## 2022-05-04 DIAGNOSIS — E1151 Type 2 diabetes mellitus with diabetic peripheral angiopathy without gangrene: Secondary | ICD-10-CM | POA: Diagnosis not present

## 2022-05-04 DIAGNOSIS — E1169 Type 2 diabetes mellitus with other specified complication: Secondary | ICD-10-CM | POA: Diagnosis not present

## 2022-05-07 ENCOUNTER — Other Ambulatory Visit: Payer: Self-pay | Admitting: *Deleted

## 2022-05-07 ENCOUNTER — Encounter: Payer: Self-pay | Admitting: *Deleted

## 2022-05-07 NOTE — Patient Outreach (Addendum)
[facility]) Care Management  05/08/2022  [name] Mar 25, 1946 [id number]   [facility] Care coordination- welfare check  Incoming call from niece [name] who reports she was at a MD office when RN CM outreached She reports her husband and son visited pt on 05/05/22 but no one has seen or has been able to reach her via phone also today. She confirms the patient children are out of town.  [name] explained her limited support abilities related to her 4 children and her health care issues.  Another unsuccessful attempt to reach the patient at [phone] with call going straight to voice message Outreach to 911 non emergency area and spoke with Cynthia Cynthia Sickle # [phone] to file a welfare check report for [name] after [name] and RN CM agreed to do so   1424 Incoming call from [name] to confirm [name] is doing fine. He reports he found her walking in her driveway with her walker He reports she had a lot of items with her and not sure if she has her phone or medical alert bracelet with her. He informed RN CM that [name] informed him she did not know RN CM nor did she recall that she had a scheduled follow outreach with RN CM today  Outreach to pt successful. She needed re orientation to the return call she placed to RN CM on 05/03/22. She stated a few times that "I don't remember that".  She confirms some memory issues plus stated "You know everybody thinks I am crazy" Encouragement and empathy provided Assessed for Diabetes management Pt reported she woke up late today and was just eating breakfast at 1430. She states she does not know what her cbg value as she did not check it today. She reports she does not know where her cbg DME is when RN CM inquired. She confirms fatigue/sleepiness, neuropathic pain of her feet and poor eye sight. She denies thirst, headache nor dizziness  Outreach to niece [name] with pt to update her on the status of the  welfare check visit.   Discuss Utilization of her local senior center and/or [employer] for education on use of her phone and other electronics. She is aware of the location of [employer] but not the senior center   Provided again this RN CM name, number and [facility] number   Plan [facility] RN CM will follow up with patient within the next 30+ business days  Care Plan : RN Care Manager Plan of Care  Updates made by Clinton Gallant, RN since 05/08/2022 12:00 AM     Problem: Complex Care Coordination Needs and disease management in patient with diabetes, SDOH needs   Priority: High  Onset Date: 05/03/2022     Long-Range Goal: Establish Plan of Care for Management Complex SDOH Barriers, disease management and Care Coordination Needs in patient with diabetes, sdoh needs   Start Date: 05/03/2022  Recent Progress: Not on track  Priority: High  Note:   Current Barriers:  Knowledge Deficits related to plan of care for management of DMII  Care Coordination needs related to Limited social support, Transportation, Limited education about community resources*, and Lacks knowledge of community resource: transportation, food insecurity Lacks caregiver support Transportation barriers [place], Poor understanding of how to utilize her only phone source, requesting frequent calls when she does not answer. 05/07/22 Unsuccessful outreaches x 3 calls to voice message left pt 2 messages and her niece [name] a voice message  x 1 -finally with successful outreach after welfare check completed Outreach to pt successful. She needed re orientation to the return call she placed to RN CM on 05/03/22. She stated a few times that "I don't remember that".  She confirms some memory issues plus stated "You know everybody thinks I am crazy" Encouragement and empathy provided  RN CM Clinical Goal(s):  Patient will verbalize understanding of plan for management of DMII as evidenced by improved HgA1c value and  verbalization of access, usage of community resources and possible change of level of care location  through collaboration with RN Care manager, provider, and care team.   Interventions: Outreaches for care coordination, disease management, resources, home care/education needs Inter-disciplinary care team collaboration (see longitudinal plan of care) Evaluation of current treatment plan related to  self management and patient's adherence to plan as established by provider 05/07/22 Review of pcp ambulatory home health order to confirm [facility] is the chosen agency for care Spoke with [name] of [facility] [phone] confirm pt did have a home health nurse visit her on Saturday 05/04/22 [facility] scheduling, therefore RN CM [facility]  05/07/22 Outreach to 911 non emergency area and spoke with Cynthia [name] Badge # [phone] to file a welfare check report for [name] after [name] and RN CM agreed to do so    1424 Incoming call from [name] to confirm [name] is doing fine. He reports he found her walking in her driveway with her walker He reports she had a lot of items with her and not sure if she has her phone or medical alert bracelet with her. He informed RN CM that [name] informed him she did not know RN CM nor did she recall that she had a scheduled follow outreach with RN CM today   Interdisciplinary Collaboration Interventions:  (Status: New goal.) Short Term Goal   Collaborated with RN CM , Care guide to initiate plan of care to address needs related to Limited social support, Transportation, Limited access to food, Level of care concerns, and Lacks knowledge of community resource: reported lack of community SDOH resources  in patient with DMII Collaboration with [name], Cynthia C, NP  05/07/22 Discuss Utilization of her local senior center and/or [employer] for education on use of her phone  and other electronics. She is aware [facility] [employer] [facility] but not the [facility] center   Diabetes Interventions:  (Status:  New goal.) Long Term Goal Assessed patient's understanding of A1c goal: <7% Provided education to patient about basic DM disease process Reviewed medications with patient and discussed importance of medication adherence Counseled on importance of regular laboratory monitoring as prescribed Discussed plans with patient for ongoing care management follow up and provided patient with direct contact information for care management team Review of patient status, including review of consultants reports, relevant laboratory and other test results, and medications completed Screening for signs and symptoms of depression related to chronic disease state  Assessed social determinant of health barriers Lab Results  Component Value Date   HGBA1C >14.0 (H) 04/24/2022  05/07/22 Pt reported she woke up late today and was just eating breakfast at 1430. She states she does not know what her cbg value as she did not check it today. She reports she does not know where her cbg DME is when RN CM inquired. She confirms fatigue/sleepiness,  neuropathic pain of her feet and poor eye sight. She denies thirst, headache nor dizziness  Patient Goals/Self-Care Activities: Take all medications as prescribed Attend all scheduled provider appointments Perform all self care activities independently  Call provider office for new concerns or questions   Follow Up Plan:  The patient has been provided with contact information for the care management team and has been advised to call with any health related questions or concerns.  The care management team will reach out to the patient again over the next 30+ business  days.       [name] L. Noelle Penner, RN, BSN, CCM [facility] Telephonic Care Management Care Coordinator Office number [phone]

## 2022-05-07 NOTE — Patient Outreach (Incomplete Revision)
[facility]) Care Management  05/07/2022  [name] 07-30-1946 [id number]   [place] coordination- welfare check  Incoming call from niece [name] who reports she was at a MD office when RN CM outreached She reports her husband and son visited pt on 05/05/22 but no one has seen or has been able to reach her via phone also today. She confirms the patient children are out of town.  [name] explained her limited support abilities related to her 4 children and her health care issues.  Another unsuccessful attempt to reach the patient at [phone] with call going straight to voice message Outreach to 911 non emergency area and spoke with [name] # [phone] to file a welfare check report for [name] after [name] and RN CM agreed to do so   1424 Incoming call from [place] to confirm [name] is doing fine. He reports he found her walking in her driveway with her walker He reports she had a lot of items with her and not sure if she has her phone or medical alert bracelet with her. He informed RN CM that [name] informed him she did not know RN CM nor did she recall that she had a scheduled follow outreach with RN CM today  Outreach to pt successful. She needed re orientation to the return call she placed to RN CM on 05/03/22. She stated a few times that "I don't remember that".  She confirms some memory issues plus stated "You know everybody thinks I am crazy" Encouragement and empathy provided Assessed for Diabetes management Pt reported she woke up late today and was just eating breakfast at 1430. She states she does not know what her cbg value as she did not check it today. She reports she does not know where her cbg DME is when RN CM inquired. She confirms fatigue/sleepiness, neuropathic pain of her feet and poor eye sight. She denies thirst, headache nor dizziness  Outreach to niece [name] with pt to update her on the status of the  welfare check visit.   Discuss Utilization of her local senior center and/or [employer] for education on use of her phone and other electronics. She is aware of the location of [employer] but not the senior center   Provided again this RN CM name, number and [facility] number   Plan [facility] RN CM will follow up with patient within the next 30+ business days    Jaycie Kregel L. Lavina Hamman, RN, BSN, [place] Coordinator Office number [phone]

## 2022-05-07 NOTE — Patient Outreach (Signed)
[facility]) Care Management Telephonic RN Care Manager Note   05/07/2022 Name:  [name] MRN:  [id number] DOB:  16-Jul-1946  Summary: Follow up outreach to vendors for home health services,  Unsuccessful follow up attempt to patient x 2 for further assessment of needs after the memorial holidays and to updated her on the home health services. No answer Left a message informing her of RN CM outreach to [facility] and awareness of her home health visit on 05/04/22 and pending services for PT & SW.  When dialed her phone goes directly to voice message and this is the only number for the patient listed in EPIC  Recommendations/Changes made from today's visit: Review of pcp ambulatory home health order to confirm [facility] is the chosen agency for care Spoke with [facility] [phone] confirm pt did have a home health nurse visit her on Saturday 05/04/22 and is ordered for [facility] PT and SW services that is pending scheduling, therefore RN CM will not need to refer to [facility] SW  Unsuccessful outreach to patient x 2 Outreach to shaunda niece [phone] no answer left a voice message to updated her niece that RN CM had attempted to reach pt at [phone] and [phone] without success with her phone going directly to voice message   Subjective: [name] is an 76 y.o. year old female who is a primary patient of [name], [name], NP. The care management team was consulted for assistance with care management and/or care coordination needs.    Telephonic RN Care Manager completed Telephone Visit today.   Objective:  Medications Reviewed Today     Reviewed by Clinton Gallant, RN (Registered Nurse) on 05/07/22 at 1240  Med List Status: <None>   Medication Order Taking? Sig Documenting Provider Last Dose Status Informant  acetaminophen (TYLENOL) 325 MG tablet [id number] No Take 325-650 mg by mouth daily as needed for mild pain or headache. [provider] Taking Active Multiple Informants  ASPERCREME LIDOCAINE EX [id number] No Apply 1 application topically 5 (five) times daily as needed (pain). [provider] Taking Active Multiple Informants  aspirin 81 MG tablet [id number] No Take 81 mg by mouth daily. [provider] Taking Active Multiple Informants  atorvastatin (LIPITOR) 80 MG tablet [id number] No Take 1 tablet (80 mg total) by mouth daily. Myrlene Broker, MD Taking Active   citalopram (CELEXA) 20 MG tablet [id number] No Take 1 tablet (20 mg total) by mouth daily. Myrlene Broker, MD Taking Active   collagenase [facility]) 250 UNIT/GM ointment [id number]  Apply 1 application. topically 3 (three) times daily. Apply to sacrum [name], [name], NP  Active   Continuous Blood Gluc Sensor (FREESTYLE LIBRE SENSOR SYSTEM) MISC [id number]  Use to monitor sugars [name], [name], NP  Active   esomeprazole (NEXIUM) 20 MG capsule [id number] No Take 1 capsule (20 mg total) by mouth daily at 12 noon. Myrlene Broker, MD Taking Active            Med Note Haynes Dage, MELISSA Ellsworth Lennox Apr 24, 2022 10:41 AM)    ezetimibe (ZETIA) 10 MG tablet [id number] No TAKE 1 TABLET BY MOUTH  DAILY Myrlene Broker, MD Taking Active   gabapentin (NEURONTIN) 300 MG capsule [id number] No TAKE 1 CAPSULE BY MOUTH 3  TIMES DAILY Myrlene Broker, MD Taking Active   insulin degludec (TRESIBA FLEXTOUCH) 200 UNIT/ML FlexTouch Pen [id number]  Inject 44 Units  into the skin at bedtime. [name], Donalee Citrin, NP  Active   Insulin Pen Needle 31G X 8 MM MISC [id number]  Use to inject insulin four times daily E11.41 [name], [name], NP  Active   metFORMIN (GLUCOPHAGE) 500 MG tablet [id number] No Take 1 tablet (500 mg total) by mouth 2 (two) times daily with a meal. Myrlene Broker, MD Taking Active   metoprolol tartrate (LOPRESSOR) 25 MG tablet [id number] No Take 0.5 tablets (12.5 mg total) by mouth 2 (two) times daily. Myrlene Broker, MD  Taking Active   Naphazoline HCl (CLEAR EYES OP) [id number] No Place 1 drop into both eyes daily as needed (dry eyes/itching). [provider] Taking Active Multiple Informants  nystatin (MYCOSTATIN/NYSTOP) powder [id number] No Apply 1 application. topically 3 (three) times daily. [name], Donalee Citrin, NP Taking Active   Lum Babe test strip [id number] No USE AS DIRECTED Myrlene Broker, MD Taking Active   Potassium Chloride ER 20 MEQ TBCR [id number] No Take 20 mEq by mouth daily. Myrlene Broker, MD Taking Expired 11/03/21 2359   Semaglutide (RYBELSUS) 14 MG TABS [id number] No Take 14 mg by mouth daily. Myrlene Broker, MD Taking Active   silver sulfADIAZINE (SILVADENE) 1 % cream [id number] No Apply 1 application topically daily. Myrlene Broker, MD Taking Active   sodium chloride (OCEAN) 0.65 % SOLN nasal spray [id number] No Place 1 spray into both nostrils 4 (four) times daily. Myrlene Broker, MD Taking Active   torsemide (DEMADEX) 20 MG tablet [id number] No Take 1 tablet (20 mg total) by mouth daily. Myrlene Broker, MD Taking Active              SDOH:  (Social Determinants of Health) assessments and interventions performed:    Care Plan  Review of patient past medical history, allergies, medications, health status, including review of consultants reports, laboratory and other test data, was performed as part of comprehensive evaluation for care management services.   Care Plan : RN Care Manager Plan of Care  Updates made by Clinton Gallant, RN since 05/07/2022 12:00 AM     Problem: Complex Care Coordination Needs and disease management in patient with diabetes, SDOH needs   Priority: High  Onset Date: 05/03/2022     Long-Range Goal: Establish Plan of Care for Management Complex SDOH Barriers, disease management and Care Coordination Needs in patient with diabetes, sdoh needs   Start Date: 05/03/2022  This Visit's Progress: Not on track   Recent Progress: Not on track  Priority: High  Note:   Current Barriers:  Knowledge Deficits related to plan of care for management of DMII  Care Coordination needs related to Limited social support, Transportation, Limited education about community resources*, and Lacks knowledge of community resource: transportation, food insecurity Lacks caregiver support Transportation barriers [place], Poor understanding of how to utilize her only phone source, requesting frequent calls when she does not answer. 05/07/22 Unsuccessful outreaches x 2 calls to voice message left pt 2 messages and her niece [name] a voice message x 1   RN CM Clinical Goal(s):  Patient will verbalize understanding of plan for management of DMII as evidenced by improved HgA1c value and verbalization of access, usage of community resources and possible change of level of care location  through collaboration with RN Care manager, provider, and care team.   Interventions: Outreaches for care coordination, disease management, resources, home care/education needs Inter-disciplinary care team collaboration (see longitudinal plan of care) Evaluation of current  treatment plan related to  self management and patient's adherence to plan as established by provider 05/07/22 Review of pcp ambulatory home health order to confirm [facility] is the chosen agency for care Spoke with [facility] [phone] confirm pt did have a home health nurse visit her on Saturday 05/04/22 and [facility] that [facility] pending scheduling, therefore RN CM will not need [facility]    Interdisciplinary Collaboration Interventions:  (Status: New goal.) Short Term Goal   Collaborated with RN CM , Care guide to initiate plan of care to address needs related to Limited social support, Transportation, Limited access to food, Level of care concerns, and Lacks knowledge of community resource: reported lack of  community SDOH resources  in patient with DMII Collaboration with [name], [name], NP   Diabetes Interventions:  (Status:  New goal.) Long Term Goal Assessed patient's understanding of A1c goal: <7% Provided education to patient about basic DM disease process Reviewed medications with patient and discussed importance of medication adherence Counseled on importance of regular laboratory monitoring as prescribed Discussed plans with patient for ongoing care management follow up and provided patient with direct contact information for care management team Review of patient status, including review of consultants reports, relevant laboratory and other test results, and medications completed Screening for signs and symptoms of depression related to chronic disease state  Assessed social determinant of health barriers Lab Results  Component Value Date   HGBA1C >14.0 (H) 04/24/2022   Patient Goals/Self-Care Activities: Take all medications as prescribed Attend all scheduled provider appointments Perform all self care activities independently  Call provider office for new concerns or questions   Follow Up Plan:  The patient has been provided with contact information for the care management team and has been advised to call with any health related questions or concerns.  The care management team will reach out to the patient again over the next 30+ business  days.       Plan: The patient has been provided with contact information for the care management team and has been advised to call with any health related questions or concerns.  The care management team will reach out to the patient again over the next 30+ business days.  Laia Wiley L. Noelle PennerGibbs, RN, BSN, CCM [facility] Telephonic Care Management Care Coordinator Office number [phone] [facility] number [phone] Fax number [phone]

## 2022-05-09 DIAGNOSIS — E1151 Type 2 diabetes mellitus with diabetic peripheral angiopathy without gangrene: Secondary | ICD-10-CM | POA: Diagnosis not present

## 2022-05-09 DIAGNOSIS — Z9181 History of falling: Secondary | ICD-10-CM | POA: Diagnosis not present

## 2022-05-09 DIAGNOSIS — M7731 Calcaneal spur, right foot: Secondary | ICD-10-CM | POA: Diagnosis not present

## 2022-05-09 DIAGNOSIS — K219 Gastro-esophageal reflux disease without esophagitis: Secondary | ICD-10-CM | POA: Diagnosis not present

## 2022-05-09 DIAGNOSIS — I110 Hypertensive heart disease with heart failure: Secondary | ICD-10-CM | POA: Diagnosis not present

## 2022-05-09 DIAGNOSIS — N1831 Chronic kidney disease, stage 3a: Secondary | ICD-10-CM | POA: Diagnosis not present

## 2022-05-09 DIAGNOSIS — I872 Venous insufficiency (chronic) (peripheral): Secondary | ICD-10-CM | POA: Diagnosis not present

## 2022-05-09 DIAGNOSIS — E11622 Type 2 diabetes mellitus with other skin ulcer: Secondary | ICD-10-CM | POA: Diagnosis not present

## 2022-05-09 DIAGNOSIS — H539 Unspecified visual disturbance: Secondary | ICD-10-CM | POA: Diagnosis not present

## 2022-05-09 DIAGNOSIS — I5032 Chronic diastolic (congestive) heart failure: Secondary | ICD-10-CM | POA: Diagnosis not present

## 2022-05-09 DIAGNOSIS — Z4800 Encounter for change or removal of nonsurgical wound dressing: Secondary | ICD-10-CM | POA: Diagnosis not present

## 2022-05-09 DIAGNOSIS — E785 Hyperlipidemia, unspecified: Secondary | ICD-10-CM | POA: Diagnosis not present

## 2022-05-09 DIAGNOSIS — L97821 Non-pressure chronic ulcer of other part of left lower leg limited to breakdown of skin: Secondary | ICD-10-CM | POA: Diagnosis not present

## 2022-05-09 DIAGNOSIS — E1122 Type 2 diabetes mellitus with diabetic chronic kidney disease: Secondary | ICD-10-CM | POA: Diagnosis not present

## 2022-05-09 DIAGNOSIS — L97819 Non-pressure chronic ulcer of other part of right lower leg with unspecified severity: Secondary | ICD-10-CM | POA: Diagnosis not present

## 2022-05-09 DIAGNOSIS — M7531 Calcific tendinitis of right shoulder: Secondary | ICD-10-CM | POA: Diagnosis not present

## 2022-05-09 DIAGNOSIS — R413 Other amnesia: Secondary | ICD-10-CM | POA: Diagnosis not present

## 2022-05-09 DIAGNOSIS — K5790 Diverticulosis of intestine, part unspecified, without perforation or abscess without bleeding: Secondary | ICD-10-CM | POA: Diagnosis not present

## 2022-05-09 DIAGNOSIS — E1169 Type 2 diabetes mellitus with other specified complication: Secondary | ICD-10-CM | POA: Diagnosis not present

## 2022-05-09 DIAGNOSIS — E1136 Type 2 diabetes mellitus with diabetic cataract: Secondary | ICD-10-CM | POA: Diagnosis not present

## 2022-05-09 DIAGNOSIS — M7660 Achilles tendinitis, unspecified leg: Secondary | ICD-10-CM | POA: Diagnosis not present

## 2022-05-09 DIAGNOSIS — I890 Lymphedema, not elsewhere classified: Secondary | ICD-10-CM | POA: Diagnosis not present

## 2022-05-09 DIAGNOSIS — E1142 Type 2 diabetes mellitus with diabetic polyneuropathy: Secondary | ICD-10-CM | POA: Diagnosis not present

## 2022-05-10 ENCOUNTER — Encounter: Payer: Self-pay | Admitting: Podiatry

## 2022-05-10 ENCOUNTER — Ambulatory Visit: Payer: Medicare Other | Admitting: Podiatry

## 2022-05-10 ENCOUNTER — Ambulatory Visit (INDEPENDENT_AMBULATORY_CARE_PROVIDER_SITE_OTHER): Payer: Medicare Other

## 2022-05-10 DIAGNOSIS — M7752 Other enthesopathy of left foot: Secondary | ICD-10-CM

## 2022-05-10 DIAGNOSIS — D689 Coagulation defect, unspecified: Secondary | ICD-10-CM

## 2022-05-10 DIAGNOSIS — M779 Enthesopathy, unspecified: Secondary | ICD-10-CM

## 2022-05-10 DIAGNOSIS — L84 Corns and callosities: Secondary | ICD-10-CM

## 2022-05-10 DIAGNOSIS — M7751 Other enthesopathy of right foot: Secondary | ICD-10-CM

## 2022-05-10 MED ORDER — TRIAMCINOLONE ACETONIDE 10 MG/ML IJ SUSP
20.0000 mg | Freq: Once | INTRAMUSCULAR | Status: AC
  Administered 2022-05-10: 20 mg

## 2022-05-11 DIAGNOSIS — E1142 Type 2 diabetes mellitus with diabetic polyneuropathy: Secondary | ICD-10-CM | POA: Diagnosis not present

## 2022-05-11 DIAGNOSIS — M7731 Calcaneal spur, right foot: Secondary | ICD-10-CM | POA: Diagnosis not present

## 2022-05-11 DIAGNOSIS — M7531 Calcific tendinitis of right shoulder: Secondary | ICD-10-CM | POA: Diagnosis not present

## 2022-05-11 DIAGNOSIS — I110 Hypertensive heart disease with heart failure: Secondary | ICD-10-CM | POA: Diagnosis not present

## 2022-05-11 DIAGNOSIS — I890 Lymphedema, not elsewhere classified: Secondary | ICD-10-CM | POA: Diagnosis not present

## 2022-05-11 DIAGNOSIS — K219 Gastro-esophageal reflux disease without esophagitis: Secondary | ICD-10-CM | POA: Diagnosis not present

## 2022-05-11 DIAGNOSIS — L97821 Non-pressure chronic ulcer of other part of left lower leg limited to breakdown of skin: Secondary | ICD-10-CM | POA: Diagnosis not present

## 2022-05-11 DIAGNOSIS — Z4800 Encounter for change or removal of nonsurgical wound dressing: Secondary | ICD-10-CM | POA: Diagnosis not present

## 2022-05-11 DIAGNOSIS — Z9181 History of falling: Secondary | ICD-10-CM | POA: Diagnosis not present

## 2022-05-11 DIAGNOSIS — E1169 Type 2 diabetes mellitus with other specified complication: Secondary | ICD-10-CM | POA: Diagnosis not present

## 2022-05-11 DIAGNOSIS — M7660 Achilles tendinitis, unspecified leg: Secondary | ICD-10-CM | POA: Diagnosis not present

## 2022-05-11 DIAGNOSIS — I872 Venous insufficiency (chronic) (peripheral): Secondary | ICD-10-CM | POA: Diagnosis not present

## 2022-05-11 DIAGNOSIS — I5032 Chronic diastolic (congestive) heart failure: Secondary | ICD-10-CM | POA: Diagnosis not present

## 2022-05-11 DIAGNOSIS — L97819 Non-pressure chronic ulcer of other part of right lower leg with unspecified severity: Secondary | ICD-10-CM | POA: Diagnosis not present

## 2022-05-11 DIAGNOSIS — E1136 Type 2 diabetes mellitus with diabetic cataract: Secondary | ICD-10-CM | POA: Diagnosis not present

## 2022-05-11 DIAGNOSIS — H539 Unspecified visual disturbance: Secondary | ICD-10-CM | POA: Diagnosis not present

## 2022-05-11 DIAGNOSIS — R413 Other amnesia: Secondary | ICD-10-CM | POA: Diagnosis not present

## 2022-05-11 DIAGNOSIS — E1122 Type 2 diabetes mellitus with diabetic chronic kidney disease: Secondary | ICD-10-CM | POA: Diagnosis not present

## 2022-05-11 DIAGNOSIS — E785 Hyperlipidemia, unspecified: Secondary | ICD-10-CM | POA: Diagnosis not present

## 2022-05-11 DIAGNOSIS — K5790 Diverticulosis of intestine, part unspecified, without perforation or abscess without bleeding: Secondary | ICD-10-CM | POA: Diagnosis not present

## 2022-05-11 DIAGNOSIS — N1831 Chronic kidney disease, stage 3a: Secondary | ICD-10-CM | POA: Diagnosis not present

## 2022-05-11 DIAGNOSIS — E11622 Type 2 diabetes mellitus with other skin ulcer: Secondary | ICD-10-CM | POA: Diagnosis not present

## 2022-05-11 DIAGNOSIS — E1151 Type 2 diabetes mellitus with diabetic peripheral angiopathy without gangrene: Secondary | ICD-10-CM | POA: Diagnosis not present

## 2022-05-12 DIAGNOSIS — E1169 Type 2 diabetes mellitus with other specified complication: Secondary | ICD-10-CM | POA: Diagnosis not present

## 2022-05-12 DIAGNOSIS — H539 Unspecified visual disturbance: Secondary | ICD-10-CM | POA: Diagnosis not present

## 2022-05-12 DIAGNOSIS — K219 Gastro-esophageal reflux disease without esophagitis: Secondary | ICD-10-CM | POA: Diagnosis not present

## 2022-05-12 DIAGNOSIS — I5032 Chronic diastolic (congestive) heart failure: Secondary | ICD-10-CM | POA: Diagnosis not present

## 2022-05-12 DIAGNOSIS — E1142 Type 2 diabetes mellitus with diabetic polyneuropathy: Secondary | ICD-10-CM | POA: Diagnosis not present

## 2022-05-12 DIAGNOSIS — L97821 Non-pressure chronic ulcer of other part of left lower leg limited to breakdown of skin: Secondary | ICD-10-CM | POA: Diagnosis not present

## 2022-05-12 DIAGNOSIS — Z4800 Encounter for change or removal of nonsurgical wound dressing: Secondary | ICD-10-CM | POA: Diagnosis not present

## 2022-05-12 DIAGNOSIS — E1122 Type 2 diabetes mellitus with diabetic chronic kidney disease: Secondary | ICD-10-CM | POA: Diagnosis not present

## 2022-05-12 DIAGNOSIS — I872 Venous insufficiency (chronic) (peripheral): Secondary | ICD-10-CM | POA: Diagnosis not present

## 2022-05-12 DIAGNOSIS — N1831 Chronic kidney disease, stage 3a: Secondary | ICD-10-CM | POA: Diagnosis not present

## 2022-05-12 DIAGNOSIS — M7660 Achilles tendinitis, unspecified leg: Secondary | ICD-10-CM | POA: Diagnosis not present

## 2022-05-12 DIAGNOSIS — E1136 Type 2 diabetes mellitus with diabetic cataract: Secondary | ICD-10-CM | POA: Diagnosis not present

## 2022-05-12 DIAGNOSIS — K5790 Diverticulosis of intestine, part unspecified, without perforation or abscess without bleeding: Secondary | ICD-10-CM | POA: Diagnosis not present

## 2022-05-12 DIAGNOSIS — Z9181 History of falling: Secondary | ICD-10-CM | POA: Diagnosis not present

## 2022-05-12 DIAGNOSIS — E785 Hyperlipidemia, unspecified: Secondary | ICD-10-CM | POA: Diagnosis not present

## 2022-05-12 DIAGNOSIS — R413 Other amnesia: Secondary | ICD-10-CM | POA: Diagnosis not present

## 2022-05-12 DIAGNOSIS — I890 Lymphedema, not elsewhere classified: Secondary | ICD-10-CM | POA: Diagnosis not present

## 2022-05-12 DIAGNOSIS — E11622 Type 2 diabetes mellitus with other skin ulcer: Secondary | ICD-10-CM | POA: Diagnosis not present

## 2022-05-12 DIAGNOSIS — I110 Hypertensive heart disease with heart failure: Secondary | ICD-10-CM | POA: Diagnosis not present

## 2022-05-12 DIAGNOSIS — E1151 Type 2 diabetes mellitus with diabetic peripheral angiopathy without gangrene: Secondary | ICD-10-CM | POA: Diagnosis not present

## 2022-05-12 DIAGNOSIS — L97819 Non-pressure chronic ulcer of other part of right lower leg with unspecified severity: Secondary | ICD-10-CM | POA: Diagnosis not present

## 2022-05-12 DIAGNOSIS — M7731 Calcaneal spur, right foot: Secondary | ICD-10-CM | POA: Diagnosis not present

## 2022-05-12 DIAGNOSIS — M7531 Calcific tendinitis of right shoulder: Secondary | ICD-10-CM | POA: Diagnosis not present

## 2022-05-12 NOTE — Progress Notes (Signed)
Subjective:   Patient ID: [name], female   DOB: 76 y.o.   MRN: [id number]   HPI Patient presents with severe lymphedema who has pain underneath the first metatarsal head bilateral with lesion formation bilateral that are painful when pressed.  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

## 2022-05-14 ENCOUNTER — Encounter: Payer: Self-pay | Admitting: *Deleted

## 2022-05-14 ENCOUNTER — Other Ambulatory Visit: Payer: Self-pay | Admitting: *Deleted

## 2022-05-14 DIAGNOSIS — M7531 Calcific tendinitis of right shoulder: Secondary | ICD-10-CM | POA: Diagnosis not present

## 2022-05-14 DIAGNOSIS — Z4800 Encounter for change or removal of nonsurgical wound dressing: Secondary | ICD-10-CM | POA: Diagnosis not present

## 2022-05-14 DIAGNOSIS — E785 Hyperlipidemia, unspecified: Secondary | ICD-10-CM | POA: Diagnosis not present

## 2022-05-14 DIAGNOSIS — I872 Venous insufficiency (chronic) (peripheral): Secondary | ICD-10-CM | POA: Diagnosis not present

## 2022-05-14 DIAGNOSIS — E1142 Type 2 diabetes mellitus with diabetic polyneuropathy: Secondary | ICD-10-CM | POA: Diagnosis not present

## 2022-05-14 DIAGNOSIS — L97821 Non-pressure chronic ulcer of other part of left lower leg limited to breakdown of skin: Secondary | ICD-10-CM | POA: Diagnosis not present

## 2022-05-14 DIAGNOSIS — Z9181 History of falling: Secondary | ICD-10-CM | POA: Diagnosis not present

## 2022-05-14 DIAGNOSIS — I890 Lymphedema, not elsewhere classified: Secondary | ICD-10-CM | POA: Diagnosis not present

## 2022-05-14 DIAGNOSIS — E11622 Type 2 diabetes mellitus with other skin ulcer: Secondary | ICD-10-CM | POA: Diagnosis not present

## 2022-05-14 DIAGNOSIS — I110 Hypertensive heart disease with heart failure: Secondary | ICD-10-CM | POA: Diagnosis not present

## 2022-05-14 DIAGNOSIS — E1136 Type 2 diabetes mellitus with diabetic cataract: Secondary | ICD-10-CM | POA: Diagnosis not present

## 2022-05-14 DIAGNOSIS — L97819 Non-pressure chronic ulcer of other part of right lower leg with unspecified severity: Secondary | ICD-10-CM | POA: Diagnosis not present

## 2022-05-14 DIAGNOSIS — K219 Gastro-esophageal reflux disease without esophagitis: Secondary | ICD-10-CM | POA: Diagnosis not present

## 2022-05-14 DIAGNOSIS — M7660 Achilles tendinitis, unspecified leg: Secondary | ICD-10-CM | POA: Diagnosis not present

## 2022-05-14 DIAGNOSIS — N1831 Chronic kidney disease, stage 3a: Secondary | ICD-10-CM | POA: Diagnosis not present

## 2022-05-14 DIAGNOSIS — E1122 Type 2 diabetes mellitus with diabetic chronic kidney disease: Secondary | ICD-10-CM | POA: Diagnosis not present

## 2022-05-14 DIAGNOSIS — H539 Unspecified visual disturbance: Secondary | ICD-10-CM | POA: Diagnosis not present

## 2022-05-14 DIAGNOSIS — M7731 Calcaneal spur, right foot: Secondary | ICD-10-CM | POA: Diagnosis not present

## 2022-05-14 DIAGNOSIS — R413 Other amnesia: Secondary | ICD-10-CM | POA: Diagnosis not present

## 2022-05-14 DIAGNOSIS — I5032 Chronic diastolic (congestive) heart failure: Secondary | ICD-10-CM | POA: Diagnosis not present

## 2022-05-14 DIAGNOSIS — K5790 Diverticulosis of intestine, part unspecified, without perforation or abscess without bleeding: Secondary | ICD-10-CM | POA: Diagnosis not present

## 2022-05-14 DIAGNOSIS — E1169 Type 2 diabetes mellitus with other specified complication: Secondary | ICD-10-CM | POA: Diagnosis not present

## 2022-05-14 DIAGNOSIS — E1151 Type 2 diabetes mellitus with diabetic peripheral angiopathy without gangrene: Secondary | ICD-10-CM | POA: Diagnosis not present

## 2022-05-14 NOTE — Patient Outreach (Signed)
[name]) Care Management Telephonic RN Care Manager Note   05/14/2022 Name:  [name] MRN:  [id number] DOB:  01-May-1946  Summary: [facility] Unsuccessful outreach Outreach attempt to the listed at the preferred outreach number in [place] No answer. [facility] RN CM left HIPAA [facility] Portability and Accountability Act) compliant voicemail message along with CM's contact info.   Subjective: [name] is an 76 y.o. year old female who is a primary patient of [name], Cynthia C, NP. The care management team was consulted for assistance with care management and/or care coordination needs.    Telephonic RN Care Manager completed Telephone Visit today.   Objective:  Medications Reviewed Today     Reviewed by Wallene Huh, DPM (Physician) on 05/10/22 at 1050  Med List Status: <None>   Medication Order Taking? Sig Documenting Provider Last Dose Status Informant  acetaminophen (TYLENOL) 325 MG tablet [id number] No Take 325-650 mg by mouth daily as needed for mild pain or headache. [provider] Taking Active Multiple Informants  ASPERCREME LIDOCAINE EX [id number] No Apply 1 application topically 5 (five) times daily as needed (pain). [provider] Taking Active Multiple Informants  aspirin 81 MG tablet [id number] No Take 81 mg by mouth daily. [provider] Taking Active Multiple Informants  atorvastatin (LIPITOR) 80 MG tablet [id number] No Take 1 tablet (80 mg total) by mouth daily. Hoyt Koch, MD Taking Active   citalopram (CELEXA) 20 MG tablet [id number] No Take 1 tablet (20 mg total) by mouth daily. Hoyt Koch, MD Taking Active   collagenase [facility]) 250 UNIT/GM ointment 0000000  Apply 1 application. topically 3 (three) times daily. Apply to sacrum [name], Cynthia C, NP  Active   Continuous Blood Gluc Sensor ([place]) MISC [id number]  Use to monitor sugars [name], Cynthia  C, NP  Active   esomeprazole (NEXIUM) 20 MG capsule [id number] No Take 1 capsule (20 mg total) by mouth daily at 12 noon. Hoyt Koch, MD Taking Active            Med Note Lelon Frohlich, MELISSA Jeral Fruit Apr 24, 2022 10:41 AM)    ezetimibe (ZETIA) 10 MG tablet [id number] No TAKE 1 TABLET BY MOUTH  DAILY Hoyt Koch, MD Taking Active   gabapentin (NEURONTIN) 300 MG capsule [id number] No TAKE 1 CAPSULE BY MOUTH 3  TIMES DAILY Hoyt Koch, MD Taking Active   insulin degludec (TRESIBA FLEXTOUCH) 200 UNIT/ML FlexTouch Pen [id number]  Inject 44 Units into the skin at bedtime. [name], Cynthia Bucks, NP  Active   Insulin Pen Needle 31G X 8 MM MISC [id number]  Use to inject insulin four times daily E11.41 [name], Cynthia C, NP  Active   metFORMIN (GLUCOPHAGE) 500 MG tablet [id number] No Take 1 tablet (500 mg total) by mouth 2 (two) times daily with a meal. Hoyt Koch, MD Taking Active   metoprolol tartrate (LOPRESSOR) 25 MG tablet [id number] No Take 0.5 tablets (12.5 mg total) by mouth 2 (two) times daily. Hoyt Koch, MD Taking Active   Naphazoline HCl (CLEAR EYES OP) [id number] No Place 1 drop into both eyes daily as needed (dry eyes/itching). [provider] Taking Active Multiple Informants  nystatin (MYCOSTATIN/NYSTOP) powder 0000000 No Apply 1 application. topically 3 (three) times daily. [name], Cynthia Bucks, NP Taking Active   Roma Schanz test strip [id number] No USE AS DIRECTED Hoyt Koch, MD Taking Active   Potassium Chloride ER  20 MEQ TBCR [id number] No Take 20 mEq by mouth daily. Hoyt Koch, MD Taking Expired 11/03/21 2359   Semaglutide (RYBELSUS) 14 MG TABS [id number] No Take 14 mg by mouth daily. Hoyt Koch, MD Taking Active   silver sulfADIAZINE (SILVADENE) 1 % cream [id number] No Apply 1 application topically daily. Hoyt Koch, MD Taking Active   sodium chloride (OCEAN) 0.65 % SOLN nasal spray [id number] No Place 1  spray into both nostrils 4 (four) times daily. Hoyt Koch, MD Taking Active   torsemide (DEMADEX) 20 MG tablet [id number] No Take 1 tablet (20 mg total) by mouth daily. Hoyt Koch, MD Taking Active              SDOH:  (Social Determinants of Health) assessments and interventions performed:    Care Plan  Review of patient past medical history, allergies, medications, health status, including review of consultants reports, laboratory and other test data, was performed as part of comprehensive evaluation for care management services.   There are no care plans that you recently modified to display for this patient.    Plan: The patient has been provided with contact information for the care management team and has been advised to call with any health related questions or concerns.  The care management team will reach out to the patient again over the next 30+ business  days.  Ranelle Auker L. Lavina Hamman, RN, BSN, [place] Coordinator Office number [phone] Main [facility] number [phone] Fax number [phone]

## 2022-05-14 NOTE — Patient Outreach (Signed)
[facility]) Care Management Telephonic RN Care Manager Note   05/14/2022 Name:  [name] MRN:  [id number] DOB:  Jan 19, 1946  Summary: Pt returned a call RN CM- Follow outreach with review of podiatry office visit, other voiced concerns and initiation of Diabetes home care assessment   Podiatry office visit follow up  She confirms both legs remain swollen, toenails still bleeding and painful Confirms no resolution or improvements with podiatry visit  home health active- PT visited her today She weighed but did not offer value  Edema/incontinence She voices being on torsemide and voiding incontinent very frequently even with use of incontinence pads She voices having to redress her legs after incontinent episodes even after her home health nurses dress the site She voiced frustration that she paid/was billed $352 for "salve " she reports she could not afford Medications were billed to her by the pharmacy delivery agency She reports she paid this cost and was thankful it was close to the beginning of the month and her income funds was available soon  Wound care She discussed her concerns with her home health wound care  Transportation She confirms she has [place] forms she has to complete  She is reluctant use [place] [facility]) transportation benefit after 2 experiences related to missing appointments and incontinence while waiting for the transportation to arrive  Phone usage barrier She learned how to find her voice messages on her I phone but reports she accidentally deleted them Her niece is assisting.  Diabetes Diagnosis with DM in [place]. She is not certain of her recent HgA1c value. She confirms she is not using her [facility] [place] as she has not been able to apply her sensor. She states she inquired of assistance to get it applied from the home health nurse but was informed the nurse did have an order     11/19/21      06/12/21 03/14/21   02/22/21   03/20/20 08/29/20  Hemoglobin A1C 4.0 - 5.6 % 9.5 Abnormal   9.8 High  R, CM  12.6 High  R, CM  11.6 Abnormal   11.2 Abnormal   12.1 Abnormal   10.3 Abnormal    She is not able to apply the [place] sensor as she does not have the strength to unscrew the applicator as evidence by no success during this outreach after 3 attempts  Recommendations/Changes made from today's visit: Follow up outreach Discussed pharmacy medication tiers and differences in cost of each tier medicine Reviewed her right to refuse to purchase high cost medications on a fix income and the right to discuss with her provider a possible change of prescription to an affordable medicine Reviewed the availability to have the local post office and sanitation/garbage staff to assist in hardship times  Commended her for advocating for herself when she shared speaking to a provider about answering her questions Reminded pt of the availability to use united healthcare medicare transportation benefit Diabetes assessment started but not completed as pt does not have Libre sensor intact Guided patient through the steps of applying her [place] 2 sensor but she was unsuccessful with application  Reviewed outreach progression, needs Pt provided permission for RN CM outreach to primary MD office  Reviewed the next RN [employer] outreach appointment  Mailed resource letter with information on how to get assist from post office and garbage staff for hardship status plus info on free style libre 2 quick start guide  Outreach to pcp [phone] left a voice message at prompt #3 about freestyle sensor not being worn/barrier with pt applying it/ request [facility] RN order, for application or for it to be applied during next office visit, pt incontinence, leg edema ? Specialist/wound care services need    Subjective: [name] is an 76 y.o. year old female who is a primary patient of [name], [name], NP. The care management team  was consulted for assistance with care management and/or care coordination needs.    Telephonic RN Care Manager completed Telephone Visit today.   Objective:  Medications Reviewed Today     Reviewed by Barbaraann Faster, RN (Registered Nurse) on 05/14/22 at 42  Med List Status: <None>   Medication Order Taking? Sig Documenting Provider Last Dose Status Informant  acetaminophen (TYLENOL) 325 MG tablet [id number] No Take 325-650 mg by mouth daily as needed for mild pain or headache. [provider] Taking Active Multiple Informants  ASPERCREME LIDOCAINE EX [id number] No Apply 1 application topically 5 (five) times daily as needed (pain). [provider] Taking Active Multiple Informants  aspirin 81 MG tablet [id number] No Take 81 mg by mouth daily. [provider] Taking Active Multiple Informants  atorvastatin (LIPITOR) 80 MG tablet [id number] No Take 1 tablet (80 mg total) by mouth daily. Hoyt Koch, MD Taking Active   citalopram (CELEXA) 20 MG tablet [id number] No Take 1 tablet (20 mg total) by mouth daily. Hoyt Koch, MD Taking Active   collagenase [place]) 250 UNIT/GM ointment 0000000  Apply 1 application. topically 3 (three) times daily. Apply to sacrum [name], [name], NP  Active   Continuous Blood Gluc Sensor (Walker) MISC [id number]  Use to monitor sugars [name], [name], NP  Active   esomeprazole (NEXIUM) 20 MG capsule [id number] No Take 1 capsule (20 mg total) by mouth daily at 12 noon. Hoyt Koch, MD Taking Active            Med Note Lelon Frohlich, MELISSA Jeral Fruit Apr 24, 2022 10:41 AM)    ezetimibe (ZETIA) 10 MG tablet [id number] No TAKE 1 TABLET BY MOUTH  DAILY Hoyt Koch, MD Taking Active   gabapentin (NEURONTIN) 300 MG capsule [id number] No TAKE 1 CAPSULE BY MOUTH 3  TIMES DAILY Hoyt Koch, MD Taking Active   insulin degludec (TRESIBA FLEXTOUCH) 200 UNIT/ML FlexTouch Pen [id number]   Inject 44 Units into the skin at bedtime. [name], [name], NP  Active   Insulin Pen Needle 31G X 8 MM MISC [id number]  Use to inject insulin four times daily E11.41 [name], [name], NP  Active   metFORMIN (GLUCOPHAGE) 500 MG tablet [id number] No Take 1 tablet (500 mg total) by mouth 2 (two) times daily with a meal. Hoyt Koch, MD Taking Active   metoprolol tartrate (LOPRESSOR) 25 MG tablet [id number] No Take 0.5 tablets (12.5 mg total) by mouth 2 (two) times daily. Hoyt Koch, MD Taking Active   Naphazoline HCl (CLEAR EYES OP) [id number] No Place 1 drop into both eyes daily as needed (dry eyes/itching). [provider] Taking Active Multiple Informants  nystatin (MYCOSTATIN/NYSTOP) powder 0000000 No Apply 1 application. topically 3 (three) times daily. [name], [name], NP Taking Active   Roma Schanz test strip [id number] No USE AS DIRECTED Hoyt Koch, MD Taking Active   Potassium Chloride ER 20 MEQ TBCR [id number] No Take 20 mEq by mouth daily. Hoyt Koch, MD Taking Expired 11/03/21 2359  Semaglutide (RYBELSUS) 14 MG TABS [id number] No Take 14 mg by mouth daily. Hoyt Koch, MD Taking Active   silver sulfADIAZINE (SILVADENE) 1 % cream [id number] No Apply 1 application topically daily. Hoyt Koch, MD Taking Active   sodium chloride (OCEAN) 0.65 % SOLN nasal spray [id number] No Place 1 spray into both nostrils 4 (four) times daily. Hoyt Koch, MD Taking Active   torsemide (DEMADEX) 20 MG tablet [id number] No Take 1 tablet (20 mg total) by mouth daily. 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, limited to breakdown of skin ([place]) 01/03/2022   Fall at home 07/09/2021   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   Near syncope 05/11/2020   Pressure injury of skin  05/11/2020   Orthostatic hypotension 05/11/2020   Dental caries 05/11/2020   Aortic stenosis    Diabetic neuropathy ([place]) 11/13/2018   Left knee pain 10/24/2017   Greater trochanteric bursitis of left hip 10/24/2017   Atypical chest pain 10/10/2015   Reactive airway disease 04/09/2014   Seasonal allergic rhinitis 04/09/2014   Preventative health care 04/09/2014   Abnormality of gait 03/17/2013   Irritable bowel syndrome (IBS) 08/27/2012   Shoulder pain 09/25/2009   Type 2 diabetes with complication ([place]) 99991111   MDD (major depressive disorder) 06/18/2007   GERD 06/18/2007   Hyperlipidemia associated with type 2 diabetes mellitus ([place]) 12/27/2006   Essential hypertension 09/19/2006   Venous (peripheral) insufficiency 09/19/2006  , SDOH:  (Social Determinants of Health) assessments and interventions performed:    Care Plan  Review of patient past medical history, allergies, medications, health status, including review of consultants reports, laboratory and other test data, was performed as part of comprehensive evaluation for care management services.   Care Plan : RN Care Manager Plan of Care  Updates made by Barbaraann Faster, RN since 05/14/2022 12:00 AM     Problem: Complex Care Coordination Needs and disease management in patient with diabetes, SDOH needs   Priority: High  Onset Date: 05/03/2022     Long-Range Goal: Establish Plan of Care for Management Complex SDOH Barriers, disease management and Care Coordination Needs in patient with diabetes, sdoh needs   Start Date: 05/03/2022  This Visit's Progress: Not on track  Recent Progress: Not on track  Priority: High  Note:   Current Barriers:  Knowledge Deficits related to plan of care for management of DMII  Care Coordination needs related to Limited social support, Transportation, Limited education about community resources*, and Lacks knowledge of community resource: transportation, food insecurity Lacks caregiver  support Transportation barriers [place], Poor understanding of how to utilize her only phone source, requesting frequent calls when she does not answer. 05/07/22 Unsuccessful outreaches x 3 calls to voice message left pt 2 messages and her niece [name] a voice message x 1 -finally with successful outreach after welfare check completed Outreach to pt successful. She needed re orientation to the return call she placed to RN CM on 05/03/22. She stated a few times that "I don't remember that".  She confirms some memory issues plus stated "You know everybody thinks I am crazy" Encouragement and empathy provided 05/14/22 She is not able to apply the [place] sensor as she does not have the strength to unscrew the applicator as evidence by no success  during this outreach after 3 attempts  RN CM Clinical Goal(s):  Patient will verbalize understanding of plan for management of DMII as evidenced by improved HgA1c value and verbalization of access, usage of community resources and possible change of level of care location  through collaboration with RN Care manager, provider, and care team.   Interventions: Outreaches for care coordination, disease management, resources, home care/education needs Inter-disciplinary care team collaboration (see longitudinal plan of care) Evaluation of current treatment plan related to  self management and patient's adherence to plan as established by provider 05/07/22 Review of pcp ambulatory home health order to confirm [facility] is the chosen agency for care Spoke with Estill Bamberg of [place] [phone] confirm pt did have a home health nurse visit her on Saturday 05/04/22 and is ordered for [facility] PT and SW services that is pending scheduling, therefore RN CM will not need to refer to [facility] SW  05/07/22 Outreach to 911 non emergency area and spoke with [place] # [phone] to file a welfare check report for Mrs Pivirotto after [name] and RN CM agreed to do so     1424 Incoming call from [place] to confirm Mrs Danayla Kult is doing fine. He reports he found her walking in her driveway with her walker He reports she had a lot of items with her and not sure if she has her phone or medical alert bracelet with her. He informed RN CM that Mrs Hindes informed him she did not know RN CM nor did she recall that she had a scheduled follow outreach with RN CM today   Interdisciplinary Collaboration Interventions:  (Status: Goal on track:  NO.) Short Term Goal  Update 05/14/22 Still with limited social support, She confirms she has [place] forms she has to complete Still preference is assistance from neighbors She is reluctant use [place] [facility]) transportation benefit after 2 experiences related to missing appointments and incontinence while waiting for the transportation to arrive She not preferring meals supplied by meals on wheels or moms meals Collaborated with RN CM , Care guide to initiate plan of care to address needs related to Limited social support, Transportation, Limited access to food, Level of care concerns, and Lacks knowledge of community resource: reported lack of community SDOH resources  in patient with DMII Collaboration with [name], [name], NP  05/07/22 Discuss Utilization of her local senior center and/or [employer] for education on use of her phone and other electronics. She is aware of the location of the [place] but not the senior center  05/14/22 Reminded pt of the availability to use united healthcare medicare transportation benefit. Clinical biochemist with information on how to get assist from post office and garbage staff for hardship status   Diabetes Interventions:  (Status:  Goal on track:  NO.) Long Term Goal Update 05/14/22 Diagnosis with DM in [place]. She is not certain of her recent HgA1c value. She confirms she is not using her [facility] [place] as she has not been able to apply her sensor. She states she  inquired of assistance to get it applied from the home health nurse but was informed the nurse did have an order 05/07/22 Pt reported she woke up late today and was just eating breakfast at 1430. She states she does not know what her cbg value as she did not check it today. She reports she does not know where her cbg DME is when RN CM inquired. She confirms  fatigue/sleepiness, neuropathic pain of her feet and poor eye sight. She denies thirst, headache nor dizziness  Assessed patient's understanding of A1c goal: <7% Provided education to patient about basic DM disease process Reviewed medications with patient and discussed importance of medication adherence Counseled on importance of regular laboratory monitoring as prescribed Discussed plans with patient for ongoing care management follow up and provided patient with direct contact information for care management team Review of patient status, including review of consultants reports, relevant laboratory and other test results, and medications completed Screening for signs and symptoms of depression related to chronic disease state  Assessed social determinant of health barriers 05/14/22 Diabetes assessment started but not completed as pt does not have Libre sensor intact-Reviewed A1cs, target A1c, Risks Guided patient through the steps of applying her Freestyle [place] 2 sensor but she was unsuccessful with Education officer, environmental with information on free style libre 2 quick start guide Outreach to pcp [phone] left a voice message at prompt #3 about freestyle sensor not being worn/barrier with pt applying it/ request [facility] RN order, for application or for it to be applied during next office visit, pt incontinence, leg edema ? Specialist/wound care services need Lab Results  Component Value Date   HGBA1C >14.0 (H) 04/24/2022      11/19/21      06/12/21 03/14/21  02/22/21   03/20/20 08/29/20  05/31/19 Hemoglobin A1C 4.0 - 5.6 % 9.5 Abnormal   9.8  High  R, CM  12.6 High  R, CM  11.6 Abnormal   11.2 Abnormal   12.1 Abnormal   10.3 Abnormal   Patient Goals/Self-Care Activities: Take all medications as prescribed Attend all scheduled provider appointments Perform all self care activities independently  Call provider office for new concerns or questions   Follow Up Plan:  The patient has been provided with contact information for the care management team and has been advised to call with any health related questions or concerns.  The care management team will reach out to the patient again over the next 30+ business  days.       Plan: The patient has been provided with contact information for the care management team and has been advised to call with any health related questions or concerns.  The care management team will reach out to the patient again over the next 30 + business days.  Kijuana Ruppel L. Lavina Hamman, RN, BSN, [place] Coordinator Office number [phone] [facility] number [phone] Fax number [phone]

## 2022-05-15 ENCOUNTER — Telehealth: Payer: Self-pay | Admitting: *Deleted

## 2022-05-15 DIAGNOSIS — E11622 Type 2 diabetes mellitus with other skin ulcer: Secondary | ICD-10-CM

## 2022-05-15 NOTE — Telephone Encounter (Signed)
Spoke with Denyse Amass at [facility] and verbal orders given to Apply the [place] [employer]. Stated that they are going out tomorrow and will apply.   Kimberly with [facility] Notified, LMOM with details.

## 2022-05-15 NOTE — Telephone Encounter (Signed)
Home health Nurse to assist with application of freestyle Libre every 14 days and as needed for Type 2 Diabetes. - ambulatory referral for left leg diabetic ulcer ordered.

## 2022-05-15 NOTE — Telephone Encounter (Signed)
Edd Arbour with [facility] called with some Concerns:  1.) Patient is NOT wearing the FreeStyle Libre because she cannot apply it. Stated she does not have the strength in her arms to twist it to turn on to put it on her arm.  Patient asked Home Health to put it on but the Nurse stated that she did not have an order.   -Requesting order to be sent to Home Health to apply.  2.) Patient still has edema both legs and Incontinence and having difficulty Changing the wound dressings frequently.  -Possibly needs wound Care referral or a Wound Care Nurse.   Please Advise.

## 2022-05-16 ENCOUNTER — Encounter: Payer: Self-pay | Admitting: Nurse Practitioner

## 2022-05-16 ENCOUNTER — Encounter: Payer: Medicare Other | Admitting: Nurse Practitioner

## 2022-05-16 DIAGNOSIS — I872 Venous insufficiency (chronic) (peripheral): Secondary | ICD-10-CM | POA: Diagnosis not present

## 2022-05-16 DIAGNOSIS — E1136 Type 2 diabetes mellitus with diabetic cataract: Secondary | ICD-10-CM | POA: Diagnosis not present

## 2022-05-16 DIAGNOSIS — H539 Unspecified visual disturbance: Secondary | ICD-10-CM | POA: Diagnosis not present

## 2022-05-16 DIAGNOSIS — L97819 Non-pressure chronic ulcer of other part of right lower leg with unspecified severity: Secondary | ICD-10-CM | POA: Diagnosis not present

## 2022-05-16 DIAGNOSIS — M7731 Calcaneal spur, right foot: Secondary | ICD-10-CM | POA: Diagnosis not present

## 2022-05-16 DIAGNOSIS — I890 Lymphedema, not elsewhere classified: Secondary | ICD-10-CM | POA: Diagnosis not present

## 2022-05-16 DIAGNOSIS — E11622 Type 2 diabetes mellitus with other skin ulcer: Secondary | ICD-10-CM | POA: Diagnosis not present

## 2022-05-16 DIAGNOSIS — E1142 Type 2 diabetes mellitus with diabetic polyneuropathy: Secondary | ICD-10-CM | POA: Diagnosis not present

## 2022-05-16 DIAGNOSIS — M7531 Calcific tendinitis of right shoulder: Secondary | ICD-10-CM | POA: Diagnosis not present

## 2022-05-16 DIAGNOSIS — E1169 Type 2 diabetes mellitus with other specified complication: Secondary | ICD-10-CM | POA: Diagnosis not present

## 2022-05-16 DIAGNOSIS — Z4800 Encounter for change or removal of nonsurgical wound dressing: Secondary | ICD-10-CM | POA: Diagnosis not present

## 2022-05-16 DIAGNOSIS — L97821 Non-pressure chronic ulcer of other part of left lower leg limited to breakdown of skin: Secondary | ICD-10-CM | POA: Diagnosis not present

## 2022-05-16 DIAGNOSIS — K219 Gastro-esophageal reflux disease without esophagitis: Secondary | ICD-10-CM | POA: Diagnosis not present

## 2022-05-16 DIAGNOSIS — N1831 Chronic kidney disease, stage 3a: Secondary | ICD-10-CM | POA: Diagnosis not present

## 2022-05-16 DIAGNOSIS — E1151 Type 2 diabetes mellitus with diabetic peripheral angiopathy without gangrene: Secondary | ICD-10-CM | POA: Diagnosis not present

## 2022-05-16 DIAGNOSIS — M7660 Achilles tendinitis, unspecified leg: Secondary | ICD-10-CM | POA: Diagnosis not present

## 2022-05-16 DIAGNOSIS — Z9181 History of falling: Secondary | ICD-10-CM | POA: Diagnosis not present

## 2022-05-16 DIAGNOSIS — R413 Other amnesia: Secondary | ICD-10-CM | POA: Diagnosis not present

## 2022-05-16 DIAGNOSIS — I110 Hypertensive heart disease with heart failure: Secondary | ICD-10-CM | POA: Diagnosis not present

## 2022-05-16 DIAGNOSIS — E785 Hyperlipidemia, unspecified: Secondary | ICD-10-CM | POA: Diagnosis not present

## 2022-05-16 DIAGNOSIS — I5032 Chronic diastolic (congestive) heart failure: Secondary | ICD-10-CM | POA: Diagnosis not present

## 2022-05-16 DIAGNOSIS — K5790 Diverticulosis of intestine, part unspecified, without perforation or abscess without bleeding: Secondary | ICD-10-CM | POA: Diagnosis not present

## 2022-05-16 DIAGNOSIS — E1122 Type 2 diabetes mellitus with diabetic chronic kidney disease: Secondary | ICD-10-CM | POA: Diagnosis not present

## 2022-05-17 NOTE — Progress Notes (Signed)
This encounter was created in error - please disregard.

## 2022-05-18 ENCOUNTER — Encounter (HOSPITAL_COMMUNITY): Payer: Self-pay | Admitting: Student

## 2022-05-18 ENCOUNTER — Other Ambulatory Visit: Payer: Self-pay

## 2022-05-18 ENCOUNTER — Emergency Department (HOSPITAL_COMMUNITY): Payer: Medicare Other

## 2022-05-18 ENCOUNTER — Inpatient Hospital Stay (HOSPITAL_COMMUNITY)
Admission: EM | Admit: 2022-05-18 | Discharge: 2022-05-22 | DRG: 481 | Disposition: A | Discharge status: Skilled Nursing Facility | Payer: Medicare Other | Attending: Internal Medicine | Admitting: Internal Medicine

## 2022-05-18 DIAGNOSIS — R600 Localized edema: Secondary | ICD-10-CM | POA: Diagnosis not present

## 2022-05-18 DIAGNOSIS — E8809 Other disorders of plasma-protein metabolism, not elsewhere classified: Secondary | ICD-10-CM | POA: Diagnosis not present

## 2022-05-18 DIAGNOSIS — W19XXXA Unspecified fall, initial encounter: Principal | ICD-10-CM

## 2022-05-18 DIAGNOSIS — M6281 Muscle weakness (generalized): Secondary | ICD-10-CM | POA: Diagnosis not present

## 2022-05-18 DIAGNOSIS — Z90722 Acquired absence of ovaries, bilateral: Secondary | ICD-10-CM

## 2022-05-18 DIAGNOSIS — L89151 Pressure ulcer of sacral region, stage 1: Secondary | ICD-10-CM | POA: Diagnosis not present

## 2022-05-18 DIAGNOSIS — R6510 Systemic inflammatory response syndrome (SIRS) of non-infectious origin without acute organ dysfunction: Secondary | ICD-10-CM | POA: Diagnosis not present

## 2022-05-18 DIAGNOSIS — L89626 Pressure-induced deep tissue damage of left heel: Secondary | ICD-10-CM | POA: Diagnosis not present

## 2022-05-18 DIAGNOSIS — L89101 Pressure ulcer of unspecified part of back, stage 1: Secondary | ICD-10-CM | POA: Diagnosis not present

## 2022-05-18 DIAGNOSIS — Z6831 Body mass index (BMI) 31.0-31.9, adult: Secondary | ICD-10-CM

## 2022-05-18 DIAGNOSIS — E1142 Type 2 diabetes mellitus with diabetic polyneuropathy: Secondary | ICD-10-CM

## 2022-05-18 DIAGNOSIS — S72462A Displaced supracondylar fracture with intracondylar extension of lower end of left femur, initial encounter for closed fracture: Principal | ICD-10-CM | POA: Diagnosis present

## 2022-05-18 DIAGNOSIS — I10 Essential (primary) hypertension: Secondary | ICD-10-CM | POA: Diagnosis present

## 2022-05-18 DIAGNOSIS — S7290XA Unspecified fracture of unspecified femur, initial encounter for closed fracture: Secondary | ICD-10-CM | POA: Diagnosis present

## 2022-05-18 DIAGNOSIS — E1140 Type 2 diabetes mellitus with diabetic neuropathy, unspecified: Secondary | ICD-10-CM | POA: Diagnosis not present

## 2022-05-18 DIAGNOSIS — D72829 Elevated white blood cell count, unspecified: Secondary | ICD-10-CM | POA: Diagnosis present

## 2022-05-18 DIAGNOSIS — R531 Weakness: Secondary | ICD-10-CM | POA: Diagnosis not present

## 2022-05-18 DIAGNOSIS — E1122 Type 2 diabetes mellitus with diabetic chronic kidney disease: Secondary | ICD-10-CM | POA: Diagnosis not present

## 2022-05-18 DIAGNOSIS — D62 Acute posthemorrhagic anemia: Secondary | ICD-10-CM | POA: Diagnosis not present

## 2022-05-18 DIAGNOSIS — E11319 Type 2 diabetes mellitus with unspecified diabetic retinopathy without macular edema: Secondary | ICD-10-CM | POA: Diagnosis not present

## 2022-05-18 DIAGNOSIS — S299XXA Unspecified injury of thorax, initial encounter: Secondary | ICD-10-CM | POA: Diagnosis not present

## 2022-05-18 DIAGNOSIS — J45909 Unspecified asthma, uncomplicated: Secondary | ICD-10-CM | POA: Diagnosis not present

## 2022-05-18 DIAGNOSIS — L03115 Cellulitis of right lower limb: Secondary | ICD-10-CM | POA: Diagnosis not present

## 2022-05-18 DIAGNOSIS — S3993XA Unspecified injury of pelvis, initial encounter: Secondary | ICD-10-CM | POA: Diagnosis not present

## 2022-05-18 DIAGNOSIS — Z9049 Acquired absence of other specified parts of digestive tract: Secondary | ICD-10-CM

## 2022-05-18 DIAGNOSIS — Z7984 Long term (current) use of oral hypoglycemic drugs: Secondary | ICD-10-CM | POA: Diagnosis not present

## 2022-05-18 DIAGNOSIS — Z743 Need for continuous supervision: Secondary | ICD-10-CM | POA: Diagnosis not present

## 2022-05-18 DIAGNOSIS — L03116 Cellulitis of left lower limb: Secondary | ICD-10-CM | POA: Diagnosis present

## 2022-05-18 DIAGNOSIS — I872 Venous insufficiency (chronic) (peripheral): Secondary | ICD-10-CM | POA: Diagnosis not present

## 2022-05-18 DIAGNOSIS — E46 Unspecified protein-calorie malnutrition: Secondary | ICD-10-CM | POA: Diagnosis not present

## 2022-05-18 DIAGNOSIS — L89616 Pressure-induced deep tissue damage of right heel: Secondary | ICD-10-CM | POA: Diagnosis not present

## 2022-05-18 DIAGNOSIS — S3991XA Unspecified injury of abdomen, initial encounter: Secondary | ICD-10-CM | POA: Diagnosis not present

## 2022-05-18 DIAGNOSIS — E11621 Type 2 diabetes mellitus with foot ulcer: Secondary | ICD-10-CM | POA: Diagnosis not present

## 2022-05-18 DIAGNOSIS — I130 Hypertensive heart and chronic kidney disease with heart failure and stage 1 through stage 4 chronic kidney disease, or unspecified chronic kidney disease: Secondary | ICD-10-CM | POA: Diagnosis present

## 2022-05-18 DIAGNOSIS — I5032 Chronic diastolic (congestive) heart failure: Secondary | ICD-10-CM | POA: Diagnosis not present

## 2022-05-18 DIAGNOSIS — Z043 Encounter for examination and observation following other accident: Secondary | ICD-10-CM | POA: Diagnosis not present

## 2022-05-18 DIAGNOSIS — Z885 Allergy status to narcotic agent status: Secondary | ICD-10-CM

## 2022-05-18 DIAGNOSIS — Z7982 Long term (current) use of aspirin: Secondary | ICD-10-CM | POA: Diagnosis not present

## 2022-05-18 DIAGNOSIS — E119 Type 2 diabetes mellitus without complications: Secondary | ICD-10-CM | POA: Diagnosis present

## 2022-05-18 DIAGNOSIS — K219 Gastro-esophageal reflux disease without esophagitis: Secondary | ICD-10-CM | POA: Diagnosis present

## 2022-05-18 DIAGNOSIS — N1831 Chronic kidney disease, stage 3a: Secondary | ICD-10-CM | POA: Diagnosis present

## 2022-05-18 DIAGNOSIS — Z8249 Family history of ischemic heart disease and other diseases of the circulatory system: Secondary | ICD-10-CM

## 2022-05-18 DIAGNOSIS — Z888 Allergy status to other drugs, medicaments and biological substances status: Secondary | ICD-10-CM | POA: Diagnosis not present

## 2022-05-18 DIAGNOSIS — I471 Supraventricular tachycardia: Secondary | ICD-10-CM | POA: Diagnosis not present

## 2022-05-18 DIAGNOSIS — I491 Atrial premature depolarization: Secondary | ICD-10-CM | POA: Diagnosis not present

## 2022-05-18 DIAGNOSIS — L89156 Pressure-induced deep tissue damage of sacral region: Secondary | ICD-10-CM | POA: Diagnosis not present

## 2022-05-18 DIAGNOSIS — W1830XA Fall on same level, unspecified, initial encounter: Secondary | ICD-10-CM | POA: Diagnosis present

## 2022-05-18 DIAGNOSIS — F32A Depression, unspecified: Secondary | ICD-10-CM | POA: Diagnosis present

## 2022-05-18 DIAGNOSIS — E11622 Type 2 diabetes mellitus with other skin ulcer: Secondary | ICD-10-CM

## 2022-05-18 DIAGNOSIS — Z8041 Family history of malignant neoplasm of ovary: Secondary | ICD-10-CM

## 2022-05-18 DIAGNOSIS — I890 Lymphedema, not elsewhere classified: Secondary | ICD-10-CM | POA: Diagnosis present

## 2022-05-18 DIAGNOSIS — M1712 Unilateral primary osteoarthritis, left knee: Secondary | ICD-10-CM | POA: Diagnosis not present

## 2022-05-18 DIAGNOSIS — R6889 Other general symptoms and signs: Secondary | ICD-10-CM | POA: Diagnosis not present

## 2022-05-18 DIAGNOSIS — S72462D Displaced supracondylar fracture with intracondylar extension of lower end of left femur, subsequent encounter for closed fracture with routine healing: Secondary | ICD-10-CM | POA: Diagnosis not present

## 2022-05-18 DIAGNOSIS — E118 Type 2 diabetes mellitus with unspecified complications: Secondary | ICD-10-CM | POA: Diagnosis present

## 2022-05-18 DIAGNOSIS — S72492A Other fracture of lower end of left femur, initial encounter for closed fracture: Secondary | ICD-10-CM | POA: Diagnosis not present

## 2022-05-18 DIAGNOSIS — R739 Hyperglycemia, unspecified: Secondary | ICD-10-CM | POA: Diagnosis not present

## 2022-05-18 DIAGNOSIS — Z794 Long term (current) use of insulin: Secondary | ICD-10-CM

## 2022-05-18 DIAGNOSIS — Z79899 Other long term (current) drug therapy: Secondary | ICD-10-CM

## 2022-05-18 DIAGNOSIS — S79929A Unspecified injury of unspecified thigh, initial encounter: Secondary | ICD-10-CM | POA: Diagnosis not present

## 2022-05-18 DIAGNOSIS — E1165 Type 2 diabetes mellitus with hyperglycemia: Secondary | ICD-10-CM | POA: Diagnosis not present

## 2022-05-18 DIAGNOSIS — Z823 Family history of stroke: Secondary | ICD-10-CM

## 2022-05-18 DIAGNOSIS — Z90710 Acquired absence of both cervix and uterus: Secondary | ICD-10-CM

## 2022-05-18 DIAGNOSIS — S7292XA Unspecified fracture of left femur, initial encounter for closed fracture: Secondary | ICD-10-CM | POA: Diagnosis not present

## 2022-05-18 DIAGNOSIS — Z803 Family history of malignant neoplasm of breast: Secondary | ICD-10-CM

## 2022-05-18 DIAGNOSIS — M5030 Other cervical disc degeneration, unspecified cervical region: Secondary | ICD-10-CM | POA: Diagnosis not present

## 2022-05-18 DIAGNOSIS — E1169 Type 2 diabetes mellitus with other specified complication: Secondary | ICD-10-CM | POA: Diagnosis not present

## 2022-05-18 DIAGNOSIS — E785 Hyperlipidemia, unspecified: Secondary | ICD-10-CM | POA: Diagnosis present

## 2022-05-18 DIAGNOSIS — Y92009 Unspecified place in unspecified non-institutional (private) residence as the place of occurrence of the external cause: Secondary | ICD-10-CM | POA: Diagnosis not present

## 2022-05-18 DIAGNOSIS — S72402A Unspecified fracture of lower end of left femur, initial encounter for closed fracture: Secondary | ICD-10-CM | POA: Diagnosis not present

## 2022-05-18 DIAGNOSIS — I878 Other specified disorders of veins: Secondary | ICD-10-CM | POA: Diagnosis present

## 2022-05-18 DIAGNOSIS — S0990XA Unspecified injury of head, initial encounter: Secondary | ICD-10-CM | POA: Diagnosis not present

## 2022-05-18 DIAGNOSIS — Z833 Family history of diabetes mellitus: Secondary | ICD-10-CM

## 2022-05-18 DIAGNOSIS — L97511 Non-pressure chronic ulcer of other part of right foot limited to breakdown of skin: Secondary | ICD-10-CM | POA: Diagnosis not present

## 2022-05-18 DIAGNOSIS — Z1159 Encounter for screening for other viral diseases: Secondary | ICD-10-CM | POA: Diagnosis not present

## 2022-05-18 DIAGNOSIS — S72452A Displaced supracondylar fracture without intracondylar extension of lower end of left femur, initial encounter for closed fracture: Secondary | ICD-10-CM | POA: Diagnosis not present

## 2022-05-18 DIAGNOSIS — F419 Anxiety disorder, unspecified: Secondary | ICD-10-CM | POA: Diagnosis present

## 2022-05-18 DIAGNOSIS — M25552 Pain in left hip: Secondary | ICD-10-CM | POA: Diagnosis not present

## 2022-05-18 DIAGNOSIS — Z952 Presence of prosthetic heart valve: Secondary | ICD-10-CM

## 2022-05-18 DIAGNOSIS — R519 Headache, unspecified: Secondary | ICD-10-CM | POA: Diagnosis not present

## 2022-05-18 HISTORY — DX: Dyspnea, unspecified: R06.00

## 2022-05-18 HISTORY — DX: Unspecified osteoarthritis, unspecified site: M19.90

## 2022-05-18 LAB — URINALYSIS, ROUTINE W REFLEX MICROSCOPIC
Bacteria, UA: NONE SEEN
Bilirubin Urine: NEGATIVE
Glucose, UA: >=500 mg/dL — AB
Ketones, ur: NEGATIVE mg/dL
Nitrite: NEGATIVE
Protein, ur: NEGATIVE mg/dL
Specific Gravity, Urine: 1.007 (ref 1.005–1.030)
pH: 5 (ref 5.0–8.0)

## 2022-05-18 MED ORDER — FENTANYL CITRATE PF 50 MCG/ML IJ SOSY
50.0000 ug | PREFILLED_SYRINGE | Freq: Once | INTRAMUSCULAR | Status: AC
  Administered 2022-05-19: 50 ug via INTRAVENOUS
  Filled 2022-05-18: qty 1

## 2022-05-18 NOTE — ED Provider Notes (Signed)
[facility] EMERGENCY DEPARTMENT Provider Note   CSN: [id number] Arrival date & time: 05/18/22  2216     History  Chief Complaint  Patient presents with   [name]    [name] is a 76 y.o. female with a hx of hypertension, hyperlipidemia, diabetes mellitus, S/p TAVR, & venous insufficiency who presents to the emergency department via EMS for evaluation status post fall just prior to arrival.  Patient states that she suffers from chronic intermittent vertigo, she said that she was walking which started to feel off balance leading to a fall.  She states that she fell backwards, hit her head on a piece of furniture, and then twisted and fell onto her left side.  She did not lose consciousness.  She is having pain to the head, left side, left hip, and left knee.  Worse with movement.  No alleviating factors.  She is not on blood thinners.  She states that she was otherwise in her usual state of health today.  She does have some chronic lower extremity swelling, this has been worse over the past few months.  He has not had any of her medications today, she typically is awake during nighttime.  She denies chest pain, cough, dyspnea, vomiting or acute numbness/weakness.  HPI     Home Medications Prior to Admission medications   Medication Sig Start Date End Date Taking? Authorizing Provider  acetaminophen (TYLENOL) 325 MG tablet Take 325-650 mg by mouth daily as needed for mild pain or headache.    [provider]  ASPERCREME LIDOCAINE EX Apply 1 application topically 5 (five) times daily as needed (pain).    [provider]  aspirin 81 MG tablet Take 81 mg by mouth daily.    [provider]  atorvastatin (LIPITOR) 80 MG tablet Take 1 tablet (80 mg total) by mouth daily. 03/29/22   Hoyt Koch, MD  citalopram (CELEXA) 20 MG tablet Take 1 tablet (20 mg total) by mouth daily. 10/04/21   Hoyt Koch, MD  collagenase (SANTYL)  250 UNIT/GM ointment Apply 1 application. topically 3 (three) times daily. Apply to sacrum 05/02/22   [name], [name], NP  Continuous Blood Gluc Sensor (FREESTYLE LIBRE SENSOR SYSTEM) MISC Use to monitor sugars 05/02/22   [name], [name], NP  esomeprazole (NEXIUM) 20 MG capsule Take 1 capsule (20 mg total) by mouth daily at 12 noon. 03/07/20   Hoyt Koch, MD  ezetimibe (ZETIA) 10 MG tablet TAKE 1 TABLET BY MOUTH  DAILY 02/05/22   Hoyt Koch, MD  gabapentin (NEURONTIN) 300 MG capsule TAKE 1 CAPSULE BY MOUTH 3  TIMES DAILY 04/12/22   Hoyt Koch, MD  insulin degludec (TRESIBA FLEXTOUCH) 200 UNIT/ML FlexTouch Pen Inject 44 Units into the skin at bedtime. 05/02/22   [name], [name], NP  Insulin Pen Needle 31G X 8 MM MISC Use to inject insulin four times daily E11.41 05/02/22   [name], [name], NP  metFORMIN (GLUCOPHAGE) 500 MG tablet Take 1 tablet (500 mg total) by mouth 2 (two) times daily with a meal. 10/04/21   Hoyt Koch, MD  metoprolol tartrate (LOPRESSOR) 25 MG tablet Take 0.5 tablets (12.5 mg total) by mouth 2 (two) times daily. 10/04/21   Hoyt Koch, MD  Naphazoline HCl (CLEAR EYES OP) Place 1 drop into both eyes daily as needed (dry eyes/itching).    [provider]  nystatin (MYCOSTATIN/NYSTOP) powder Apply 1 application. topically 3 (three) times daily.  04/24/22   [name], Donalee Citrin, NP  ONETOUCH VERIO test strip USE AS DIRECTED 03/13/22   Myrlene Broker, MD  Potassium Chloride ER 20 MEQ TBCR Take 20 mEq by mouth daily. 10/04/21 11/03/21  Myrlene Broker, MD  Semaglutide (RYBELSUS) 14 MG TABS Take 14 mg by mouth daily. 10/04/21   Myrlene Broker, MD  silver sulfADIAZINE (SILVADENE) 1 % cream Apply 1 application topically daily. 01/01/22   Myrlene Broker, MD  sodium chloride (OCEAN) 0.65 % SOLN nasal spray Place 1 spray into both nostrils 4 (four) times daily. 03/07/20   Myrlene Broker, MD  torsemide (DEMADEX)  20 MG tablet Take 1 tablet (20 mg total) by mouth daily. 10/04/21   Myrlene Broker, MD      Allergies    Morphine, Codeine sulfate, Demerol [meperidine], and Propoxyphene hcl    Review of Systems   Review of Systems  Constitutional:  Negative for chills and fever.  Respiratory:  Negative for cough and shortness of breath.   Cardiovascular:  Positive for leg swelling. Negative for chest pain.  Gastrointestinal:  Positive for abdominal pain. Negative for vomiting.  Musculoskeletal:  Positive for arthralgias.  Neurological:  Positive for dizziness (chronic intermittent) and headaches. Negative for syncope, weakness and numbness.  All other systems reviewed and are negative.   Physical Exam Updated Vital Signs BP (!) 110/56   Pulse (!) 111   Temp 99.4 F (37.4 C) (Rectal)   Resp 18   Ht 5\' 4"  (1.626 m)   Wt 83.8 kg   SpO2 95%   BMI 31.71 kg/m  Physical Exam Vitals and nursing note reviewed.  Constitutional:      General: She is not in acute distress. HENT:     Head: Normocephalic and atraumatic.     Comments: No raccoon eyes or battle sign. Eyes:     Extraocular Movements: Extraocular movements intact.     Pupils: Pupils are equal, round, and reactive to light.  Cardiovascular:     Rate and Rhythm: Regular rhythm. Tachycardia present.     Comments: Palpable DP pulses, confirmed with doppler.  Pulmonary:     Effort: Pulmonary effort is normal. No respiratory distress.     Breath sounds: Normal breath sounds.  Chest:     Chest wall: No tenderness.  Abdominal:     General: There is no distension.     Palpations: Abdomen is soft.     Tenderness: There is abdominal tenderness (LLQ). There is no guarding or rebound.  Musculoskeletal:     Comments: Neck: Patient in c-collar, maintained throughout evaluation. Back: Patient will spinal precautions, no midline thoracic, lumbar, sacral, or coccygeal tenderness to palpation. Upper extremities: Actively injected the major  joints.  No focal bony tenderness. Lower extremities: Significant edema to the bilateral lower legs, patient also has erythema to the lower legs with some weeping, right greater than left, some warmth, no purulence, no lymphangetic streaking.  Left lower extremity held in a hip and knee flexed position.  Tender to palpation to the left hip more so anteriorly as well as to the left anterior knee.  Otherwise no significant bony tenderness to palpation.  Skin:    General: Skin is warm and dry.  Neurological:     Mental Status: She is alert.     Comments: Alert.  Clear speech.  Sensation grossly intact bilateral upper and lower extremities.  5 out of 5 symmetric grip strength, able to plantar/dorsiflex bilaterally against resistance.  ED Results / Procedures / Treatments   Labs (all labs ordered are listed, but only abnormal results are displayed) Labs Reviewed  COMPREHENSIVE METABOLIC PANEL - Abnormal; Notable for the following components:      Result Value   CO2 21 (*)    Glucose, Bld 355 (*)    Albumin 2.8 (*)    Alkaline Phosphatase 168 (*)    All other components within normal limits  URINALYSIS, ROUTINE W REFLEX MICROSCOPIC - Abnormal; Notable for the following components:   Color, Urine STRAW (*)    Glucose, UA >=500 (*)    Hgb urine dipstick SMALL (*)    Leukocytes,Ua SMALL (*)    All other components within normal limits  CBC WITH DIFFERENTIAL/PLATELET - Abnormal; Notable for the following components:   WBC 20.6 (*)    Neutro Abs 17.6 (*)    Monocytes Absolute 1.4 (*)    Abs Immature Granulocytes 0.09 (*)    All other components within normal limits  CBG MONITORING, ED - Abnormal; Notable for the following components:   Glucose-Capillary 315 (*)    All other components within normal limits  CULTURE, BLOOD (ROUTINE X 2)  CULTURE, BLOOD (ROUTINE X 2)  CBC WITH DIFFERENTIAL/PLATELET  LACTIC ACID, PLASMA  LACTIC ACID, PLASMA    EKG EKG  Interpretation  Date/Time:  Saturday May 18 2022 22:33:56 EDT Ventricular Rate:  107 PR Interval:  138 QRS Duration: 92 QT Interval:  327 QTC Calculation: 437 R Axis:   -34 Text Interpretation: Sinus tachycardia Left axis deviation Confirmed by Gerlene Fee [phone]) on 05/19/2022 2:31:57 AM  Radiology CT Knee Left Wo Contrast  Result Date: 05/19/2022 CLINICAL DATA:  Knee fracture. EXAM: CT OF THE LEFT KNEE WITHOUT CONTRAST TECHNIQUE: Multidetector CT imaging of the left knee was performed according to the standard protocol. Multiplanar CT image reconstructions were also generated. RADIATION DOSE REDUCTION: This exam was performed according to the departmental dose-optimization program which includes automated exposure control, adjustment of the mA and/or kV according to patient size and/or use of iterative reconstruction technique. COMPARISON:  05/18/2022. FINDINGS: Bones/Joint/Cartilage There is a comminuted fracture of the supracondylar femur with intra-articular extension and mild posterior displacement of the distal fracture fragment. The remaining bony structures are intact. There are moderate degenerative changes in the patellofemoral and medial compartments. Mild degenerative changes are noted in the lateral compartment. Enthesopathic changes are noted at the patella. There is a moderate joint effusion. Ligaments Suboptimally assessed by CT. Muscles and Tendons No intramuscular hematoma.  The visualized tendons appear intact. Soft tissues Diffuse subcutaneous edema about the knee. Vascular calcifications are noted in the soft soft tissues. IMPRESSION: 1. Comminuted fracture of the supracondylar distal femur with intra-articular extension and mild posterior displacement of the distal fracture fragment. 2. Moderate joint effusion. 3. Mild-to-moderate degenerative changes at the knee. Electronically Signed   By: Brett Fairy M.D.   On: 05/19/2022 02:08   CT CHEST ABDOMEN PELVIS W  CONTRAST  Result Date: 05/19/2022 CLINICAL DATA:  Fall, hit back of head. EXAM: CT CHEST, ABDOMEN, AND PELVIS WITH CONTRAST TECHNIQUE: Multidetector CT imaging of the chest, abdomen and pelvis was performed following the standard protocol during bolus administration of intravenous contrast. RADIATION DOSE REDUCTION: This exam was performed according to the departmental dose-optimization program which includes automated exposure control, adjustment of the mA and/or kV according to patient size and/or use of iterative reconstruction technique. CONTRAST:  121mL OMNIPAQUE IOHEXOL 300 MG/ML  SOLN COMPARISON:  05/12/2020 FINDINGS: CT CHEST FINDINGS  Cardiovascular: Heart is normal size. Aorta is normal caliber. Prior aortic valve repair. Coronary artery and aortic calcifications. Mediastinum/Nodes: No mediastinal, hilar, or axillary adenopathy. Trachea and esophagus are unremarkable. Thyroid unremarkable. Lungs/Pleura: Lungs are clear. No focal airspace opacities or suspicious nodules. No effusions. No pneumothorax Musculoskeletal: No acute bony abnormality. CT ABDOMEN PELVIS FINDINGS Hepatobiliary: Prior cholecystectomy. No focal hepatic abnormality or perihepatic hematoma. Pancreas: No focal abnormality or ductal dilatation. Spleen: No splenic injury or perisplenic hematoma. Adrenals/Urinary Tract: No adrenal hemorrhage or renal injury identified. Bladder is unremarkable. Stomach/Bowel: Normal appendix. Stomach, large and small bowel grossly unremarkable. Vascular/Lymphatic: Aortic atherosclerosis. No evidence of aneurysm or adenopathy. Reproductive: Prior hysterectomy.  No adnexal masses. Other: No free fluid or free air.  Umbilical hernia containing fat. Musculoskeletal: No acute bony abnormality. Stable chronic L1 compression fracture. IMPRESSION: No acute findings or evidence of acute traumatic injury in the chest, abdomen or pelvis. Coronary artery disease, aortic atherosclerosis. Electronically Signed   By: Rolm Baptise M.D.   On: 05/19/2022 01:53   CT CERVICAL SPINE WO CONTRAST  Result Date: 05/19/2022 CLINICAL DATA:  Fall, hit EXAM: CT CERVICAL SPINE WITHOUT CONTRAST TECHNIQUE: Multidetector CT imaging of the cervical spine was performed without intravenous contrast. Multiplanar CT image reconstructions were also generated. RADIATION DOSE REDUCTION: This exam was performed according to the departmental dose-optimization program which includes automated exposure control, adjustment of the mA and/or kV according to patient size and/or use of iterative reconstruction technique. COMPARISON:  05/10/2020 FINDINGS: Alignment: Normal Skull base and vertebrae: No acute fracture. No primary bone lesion or focal pathologic process. Soft tissues and spinal canal: No prevertebral fluid or swelling. No visible canal hematoma. Disc levels:  Diffuse degenerative disc disease and facet disease. Upper chest: No acute findings Other: None IMPRESSION: Degenerative changes.  No acute bony abnormality. Electronically Signed   By: Rolm Baptise M.D.   On: 05/19/2022 01:48   CT HEAD WO CONTRAST  Result Date: 05/19/2022 CLINICAL DATA:  Fall, hit back of head EXAM: CT HEAD WITHOUT CONTRAST TECHNIQUE: Contiguous axial images were obtained from the base of the skull through the vertex without intravenous contrast. RADIATION DOSE REDUCTION: This exam was performed according to the departmental dose-optimization program which includes automated exposure control, adjustment of the mA and/or kV according to patient size and/or use of iterative reconstruction technique. COMPARISON:  05/10/2020 FINDINGS: Brain: No acute intracranial abnormality. Specifically, no hemorrhage, hydrocephalus, mass lesion, acute infarction, or significant intracranial injury. Vascular: No hyperdense vessel or unexpected calcification. Skull: No acute calvarial abnormality. Sinuses/Orbits: No acute findings Other: None IMPRESSION: No acute intracranial abnormality.  Electronically Signed   By: Rolm Baptise M.D.   On: 05/19/2022 01:47   DG Hip Unilat With Pelvis 2-3 Views Left  Result Date: 05/18/2022 CLINICAL DATA:  Status post fall. EXAM: DG HIP (WITH OR WITHOUT PELVIS) 2-3V LEFT COMPARISON:  None Available. FINDINGS: There is no evidence of hip fracture or dislocation. Degenerative changes are seen in the form of joint space narrowing and acetabular sclerosis. IMPRESSION: No acute osseous abnormality. Electronically Signed   By: Virgina Norfolk M.D.   On: 05/18/2022 23:17   DG Knee Complete 4 Views Left  Result Date: 05/18/2022 CLINICAL DATA:  Status post fall. EXAM: LEFT KNEE - COMPLETE 4+ VIEW COMPARISON:  None Available. FINDINGS: An acute fracture deformity is seen extending through the supracondylar region of the distal left femur. Approximately 1/2 shaft width posterior displacement of the distal fracture site is seen. There is no evidence of  dislocation. A large joint effusion is seen. IMPRESSION: Acute fracture of the distal left femur with an associated large joint effusion. Electronically Signed   By: Virgina Norfolk M.D.   On: 05/18/2022 23:16   DG Chest Port 1 View  Result Date: 05/18/2022 CLINICAL DATA:  Fall EXAM: PORTABLE CHEST 1 VIEW COMPARISON:  None Available. FINDINGS: Prior aortic valve repair. Heart is upper limits normal in size. Aortic calcifications. Mediastinal contours within normal limits. No confluent opacities or effusions. No pneumothorax. No acute bony abnormality. IMPRESSION: No active disease. Electronically Signed   By: Rolm Baptise M.D.   On: 05/18/2022 23:15    Procedures Procedures    Medications Ordered in ED Medications  fentaNYL (SUBLIMAZE) injection 50 mcg (50 mcg Intravenous Given 05/19/22 0027)  fentaNYL (SUBLIMAZE) injection 50 mcg (50 mcg Intravenous Given 05/19/22 0428)  iohexol (OMNIPAQUE) 300 MG/ML solution 100 mL (100 mLs Intravenous Contrast Given 05/19/22 0144)    ED Course/ Medical Decision Making/  A&P                           Medical Decision Making Amount and/or Complexity of Data Reviewed Labs: ordered. Radiology: ordered.  Risk Prescription drug management. Decision regarding hospitalization.   Patient presents to the ED with complaints of fall with subsequent left sided pain, this involves an extensive number of treatment options, and is a complaint that carries with it a high risk of complications and morbidity. Nontoxic, vitals w/ tachycardia and hypertension.   Additional history obtained:  Chart & nursing note reviewed.  Last echo with grade II diastolic dysfunction, EF [id number].   EKG: Sinus tachycardia Left axis deviation  Lab Tests:  I viewed & interpreted labs including:  CBC: Leukocytosis at 20,600 with left shift CMP: Hyperglycemia bicarb is 21 however anion gap is normal.  Mild hypoalbuminemia.  No critical electrolyte derangement. UA: No obvious UTI.  Imaging Studies:  I ordered and viewed the following imaging - xrays of the chest, left hip and left knee as well as trauma CT imaging, agree with radiologist impression as above--- patient with acute fracture of the distal left femur with an associated large joint effusion.  Case was discussed with orthopedic surgeon Dr. Sammuel Hines- recommends placing in knee immobilizer, CT left knee, keep NPO for possible OR tomorrow.   NVI distally to fracture. Feeling improved on re-assessment S/p fentanyl.   Patient tachycardic with leukocytosis, subsequently checked rectal temp 99.4.  No obvious findings of infection on her urinalysis or her CT imaging today.  Considering chronic skin changes with edema versus cellulitis of the lower extremities, present bilaterally.  Blood cultures & lactic acid ordered.  Discussed with hospitalist Dr. Hal Hope who has seen the patient, has placed orders for antibiotics, accepts admission.  Findings and plan of care discussed with supervising physician Dr. Sedonia Small who has evaluated the  patient as shared visit & is in agreement.   Portions of this note were generated with [employer]. Dictation errors may occur despite best attempts at proofreading.   Final Clinical Impression(s) / ED Diagnoses Final diagnoses:  Fall, initial encounter  Closed displaced supracondylar fracture of distal end of left femur with intracondylar extension, initial encounter [facility])    Rx / DC Orders ED Discharge Orders     None         Amaryllis Dyke, PA-C 05/19/22 0531    Maudie Flakes, MD 05/29/22 [phone]

## 2022-05-18 NOTE — ED Triage Notes (Signed)
The pt arrived  by [facility] from where the pt fell  she is not on blood thinners she is c/o od pain in the back of her head and pain in her  lt hip  she has a skin disease that causes a red rash from just under both knees  she reports that she has always had a problem but her legs have been worse for the past two months a and o x 4

## 2022-05-19 ENCOUNTER — Inpatient Hospital Stay (HOSPITAL_COMMUNITY): Payer: Medicare Other

## 2022-05-19 ENCOUNTER — Other Ambulatory Visit: Payer: Self-pay

## 2022-05-19 ENCOUNTER — Encounter (HOSPITAL_COMMUNITY)
Admission: EM | Disposition: A | Discharge status: Skilled Nursing Facility | Payer: Self-pay | Source: Home / Self Care | Attending: Internal Medicine

## 2022-05-19 ENCOUNTER — Encounter (HOSPITAL_COMMUNITY): Payer: Self-pay | Admitting: Internal Medicine

## 2022-05-19 ENCOUNTER — Inpatient Hospital Stay (HOSPITAL_COMMUNITY): Payer: Medicare Other | Admitting: Anesthesiology

## 2022-05-19 ENCOUNTER — Emergency Department (HOSPITAL_COMMUNITY): Payer: Medicare Other

## 2022-05-19 DIAGNOSIS — R6510 Systemic inflammatory response syndrome (SIRS) of non-infectious origin without acute organ dysfunction: Secondary | ICD-10-CM | POA: Diagnosis present

## 2022-05-19 DIAGNOSIS — E1122 Type 2 diabetes mellitus with diabetic chronic kidney disease: Secondary | ICD-10-CM | POA: Diagnosis present

## 2022-05-19 DIAGNOSIS — I130 Hypertensive heart and chronic kidney disease with heart failure and stage 1 through stage 4 chronic kidney disease, or unspecified chronic kidney disease: Secondary | ICD-10-CM | POA: Diagnosis present

## 2022-05-19 DIAGNOSIS — Z888 Allergy status to other drugs, medicaments and biological substances status: Secondary | ICD-10-CM | POA: Diagnosis not present

## 2022-05-19 DIAGNOSIS — E1165 Type 2 diabetes mellitus with hyperglycemia: Secondary | ICD-10-CM | POA: Diagnosis present

## 2022-05-19 DIAGNOSIS — Y92009 Unspecified place in unspecified non-institutional (private) residence as the place of occurrence of the external cause: Secondary | ICD-10-CM | POA: Diagnosis not present

## 2022-05-19 DIAGNOSIS — S7290XA Unspecified fracture of unspecified femur, initial encounter for closed fracture: Secondary | ICD-10-CM | POA: Diagnosis present

## 2022-05-19 DIAGNOSIS — S7292XA Unspecified fracture of left femur, initial encounter for closed fracture: Secondary | ICD-10-CM | POA: Diagnosis not present

## 2022-05-19 DIAGNOSIS — Z952 Presence of prosthetic heart valve: Secondary | ICD-10-CM | POA: Diagnosis not present

## 2022-05-19 DIAGNOSIS — E118 Type 2 diabetes mellitus with unspecified complications: Secondary | ICD-10-CM

## 2022-05-19 DIAGNOSIS — I5032 Chronic diastolic (congestive) heart failure: Secondary | ICD-10-CM | POA: Diagnosis present

## 2022-05-19 DIAGNOSIS — S72462A Displaced supracondylar fracture with intracondylar extension of lower end of left femur, initial encounter for closed fracture: Secondary | ICD-10-CM

## 2022-05-19 DIAGNOSIS — Z794 Long term (current) use of insulin: Secondary | ICD-10-CM | POA: Diagnosis not present

## 2022-05-19 DIAGNOSIS — Z79899 Other long term (current) drug therapy: Secondary | ICD-10-CM | POA: Diagnosis not present

## 2022-05-19 DIAGNOSIS — E785 Hyperlipidemia, unspecified: Secondary | ICD-10-CM | POA: Diagnosis present

## 2022-05-19 DIAGNOSIS — I471 Supraventricular tachycardia: Secondary | ICD-10-CM | POA: Diagnosis not present

## 2022-05-19 DIAGNOSIS — F32A Depression, unspecified: Secondary | ICD-10-CM | POA: Diagnosis present

## 2022-05-19 DIAGNOSIS — Z7984 Long term (current) use of oral hypoglycemic drugs: Secondary | ICD-10-CM | POA: Diagnosis not present

## 2022-05-19 DIAGNOSIS — Z885 Allergy status to narcotic agent status: Secondary | ICD-10-CM | POA: Diagnosis not present

## 2022-05-19 DIAGNOSIS — I10 Essential (primary) hypertension: Secondary | ICD-10-CM | POA: Diagnosis not present

## 2022-05-19 DIAGNOSIS — E119 Type 2 diabetes mellitus without complications: Secondary | ICD-10-CM | POA: Diagnosis not present

## 2022-05-19 DIAGNOSIS — Z7982 Long term (current) use of aspirin: Secondary | ICD-10-CM | POA: Diagnosis not present

## 2022-05-19 DIAGNOSIS — D72829 Elevated white blood cell count, unspecified: Secondary | ICD-10-CM | POA: Diagnosis not present

## 2022-05-19 DIAGNOSIS — K219 Gastro-esophageal reflux disease without esophagitis: Secondary | ICD-10-CM | POA: Diagnosis present

## 2022-05-19 DIAGNOSIS — E8809 Other disorders of plasma-protein metabolism, not elsewhere classified: Secondary | ICD-10-CM | POA: Diagnosis present

## 2022-05-19 DIAGNOSIS — D62 Acute posthemorrhagic anemia: Secondary | ICD-10-CM | POA: Diagnosis not present

## 2022-05-19 DIAGNOSIS — L03116 Cellulitis of left lower limb: Secondary | ICD-10-CM | POA: Diagnosis present

## 2022-05-19 DIAGNOSIS — L89151 Pressure ulcer of sacral region, stage 1: Secondary | ICD-10-CM | POA: Diagnosis present

## 2022-05-19 DIAGNOSIS — L03115 Cellulitis of right lower limb: Secondary | ICD-10-CM | POA: Diagnosis present

## 2022-05-19 DIAGNOSIS — E1142 Type 2 diabetes mellitus with diabetic polyneuropathy: Secondary | ICD-10-CM | POA: Diagnosis not present

## 2022-05-19 DIAGNOSIS — N1831 Chronic kidney disease, stage 3a: Secondary | ICD-10-CM | POA: Diagnosis present

## 2022-05-19 DIAGNOSIS — W1830XA Fall on same level, unspecified, initial encounter: Secondary | ICD-10-CM | POA: Diagnosis present

## 2022-05-19 HISTORY — PX: FEMUR IM NAIL: SHX1597

## 2022-05-19 LAB — COMPREHENSIVE METABOLIC PANEL
ALT: 29 U/L (ref 0–44)
AST: 26 U/L (ref 15–41)
Albumin: 2.8 g/dL — ABNORMAL LOW (ref 3.5–5.0)
Alkaline Phosphatase: 168 U/L — ABNORMAL HIGH (ref 38–126)
Anion gap: 13 (ref 5–15)
BUN: 21 mg/dL (ref 8–23)
CO2: 21 mmol/L — ABNORMAL LOW (ref 22–32)
Calcium: 9 mg/dL (ref 8.9–10.3)
Chloride: 101 mmol/L (ref 98–111)
Creatinine, Ser: 0.75 mg/dL (ref 0.44–1.00)
GFR, Estimated: >60 mL/min (ref >60)
Glucose, Bld: 355 mg/dL — ABNORMAL HIGH (ref 70–99)
Potassium: 4.4 mmol/L (ref 3.5–5.1)
Sodium: 135 mmol/L (ref 135–145)
Total Bilirubin: 0.3 mg/dL (ref 0.3–1.2)
Total Protein: 6.7 g/dL (ref 6.5–8.1)

## 2022-05-19 LAB — CBC WITH DIFFERENTIAL/PLATELET
Abs Immature Granulocytes: 0.09 10*3/uL — ABNORMAL HIGH (ref 0.00–0.07)
Basophils Absolute: 0.1 10*3/uL (ref 0.0–0.1)
Basophils Relative: 1 %
Eosinophils Absolute: 0.3 10*3/uL (ref 0.0–0.5)
Eosinophils Relative: 2 %
HCT: 39 % (ref 36.0–46.0)
Hemoglobin: 13.1 g/dL (ref 12.0–15.0)
Immature Granulocytes: 0 %
Lymphocytes Relative: 5 %
Lymphs Abs: 1.1 10*3/uL (ref 0.7–4.0)
MCH: 28.3 pg (ref 26.0–34.0)
MCHC: 33.6 g/dL (ref 30.0–36.0)
MCV: 84.2 fL (ref 80.0–100.0)
Monocytes Absolute: 1.4 10*3/uL — ABNORMAL HIGH (ref 0.1–1.0)
Monocytes Relative: 7 %
Neutro Abs: 17.6 10*3/uL — ABNORMAL HIGH (ref 1.7–7.7)
Neutrophils Relative %: 85 %
Platelets: 202 10*3/uL (ref 150–400)
RBC: 4.63 MIL/uL (ref 3.87–5.11)
RDW: 12.8 % (ref 11.5–15.5)
WBC: 20.6 10*3/uL — ABNORMAL HIGH (ref 4.0–10.5)
nRBC: 0 % (ref 0.0–0.2)

## 2022-05-19 LAB — CBG MONITORING, ED
Glucose-Capillary: 315 mg/dL — ABNORMAL HIGH (ref 70–99)
Glucose-Capillary: 333 mg/dL — ABNORMAL HIGH (ref 70–99)

## 2022-05-19 LAB — GLUCOSE, CAPILLARY
Glucose-Capillary: 287 mg/dL — ABNORMAL HIGH (ref 70–99)
Glucose-Capillary: 187 mg/dL — ABNORMAL HIGH (ref 70–99)
Glucose-Capillary: 171 mg/dL — ABNORMAL HIGH (ref 70–99)
Glucose-Capillary: 358 mg/dL — ABNORMAL HIGH (ref 70–99)

## 2022-05-19 LAB — LACTIC ACID, PLASMA
Lactic Acid, Venous: 2 mmol/L (ref 0.5–1.9)
Lactic Acid, Venous: 1.2 mmol/L (ref 0.5–1.9)

## 2022-05-19 LAB — SURGICAL PCR SCREEN
MRSA, PCR: NEGATIVE
Staphylococcus aureus: POSITIVE — AB

## 2022-05-19 SURGERY — INSERTION, INTRAMEDULLARY ROD, FEMUR, RETROGRADE
Anesthesia: General | Site: Knee | Laterality: Left

## 2022-05-19 MED ORDER — SODIUM CHLORIDE 0.9 % IV SOLN
2.0000 g | Freq: Once | INTRAVENOUS | Status: AC
  Administered 2022-05-19: 2 g via INTRAVENOUS
  Filled 2022-05-19: qty 12.5

## 2022-05-19 MED ORDER — PROPOFOL 10 MG/ML IV BOLUS
INTRAVENOUS | Status: DC | PRN
  Administered 2022-05-19: 120 mg via INTRAVENOUS

## 2022-05-19 MED ORDER — LACTATED RINGERS IV SOLN: INTRAVENOUS | Status: DC

## 2022-05-19 MED ORDER — MIDAZOLAM HCL 2 MG/2ML IJ SOLN: 0.5000 mg | Freq: Once | INTRAMUSCULAR | Status: DC | PRN

## 2022-05-19 MED ORDER — MEPERIDINE HCL 25 MG/ML IJ SOLN: 6.2500 mg | INTRAMUSCULAR | Status: DC | PRN

## 2022-05-19 MED ORDER — CHLORHEXIDINE GLUCONATE 0.12 % MT SOLN
15.0000 mL | Freq: Once | OROMUCOSAL | Status: AC
Start: 2022-05-19 — End: 2022-05-19
  Administered 2022-05-19: 15 mL via OROMUCOSAL
  Filled 2022-05-19: qty 15

## 2022-05-19 MED ORDER — PHENYLEPHRINE 80 MCG/ML (10ML) SYRINGE FOR IV PUSH (FOR BLOOD PRESSURE SUPPORT)
PREFILLED_SYRINGE | INTRAVENOUS | Status: DC | PRN
  Administered 2022-05-19: 80 ug via INTRAVENOUS
  Administered 2022-05-19 (×4): 160 ug via INTRAVENOUS

## 2022-05-19 MED ORDER — ALBUMIN HUMAN 5 % IV SOLN: INTRAVENOUS | Status: DC | PRN

## 2022-05-19 MED ORDER — ONDANSETRON HCL 4 MG/2ML IJ SOLN
INTRAMUSCULAR | Status: AC
  Filled 2022-05-19: qty 2

## 2022-05-19 MED ORDER — VANCOMYCIN HCL IN DEXTROSE 1-5 GM/200ML-% IV SOLN
1000.0000 mg | INTRAVENOUS | Status: DC
  Administered 2022-05-20: 1000 mg via INTRAVENOUS
  Filled 2022-05-19: qty 200

## 2022-05-19 MED ORDER — AMISULPRIDE (ANTIEMETIC) 5 MG/2ML IV SOLN
5.0000 mg | Freq: Once | INTRAVENOUS | Status: AC
  Administered 2022-05-19: 5 mg via INTRAVENOUS

## 2022-05-19 MED ORDER — ENOXAPARIN SODIUM 40 MG/0.4ML IJ SOSY
40.0000 mg | PREFILLED_SYRINGE | INTRAMUSCULAR | Status: DC
  Administered 2022-05-20 – 2022-05-22 (×3): 40 mg via SUBCUTANEOUS
  Filled 2022-05-19 (×3): qty 0.4

## 2022-05-19 MED ORDER — SODIUM CHLORIDE 0.9 % IV BOLUS: 500.0000 mL | Freq: Once | INTRAVENOUS | Status: DC

## 2022-05-19 MED ORDER — IOHEXOL 300 MG/ML  SOLN
100.0000 mL | Freq: Once | INTRAMUSCULAR | Status: AC | PRN
Start: 2022-05-19 — End: 2022-05-19
  Administered 2022-05-19: 100 mL via INTRAVENOUS

## 2022-05-19 MED ORDER — FENTANYL CITRATE PF 50 MCG/ML IJ SOSY
50.0000 ug | PREFILLED_SYRINGE | INTRAMUSCULAR | Status: AC | PRN
  Administered 2022-05-19 (×2): 50 ug via INTRAVENOUS
  Filled 2022-05-19 (×2): qty 1

## 2022-05-19 MED ORDER — ROCURONIUM BROMIDE 10 MG/ML (PF) SYRINGE
PREFILLED_SYRINGE | INTRAVENOUS | Status: DC | PRN
  Administered 2022-05-19: 60 mg via INTRAVENOUS

## 2022-05-19 MED ORDER — SUGAMMADEX SODIUM 200 MG/2ML IV SOLN
INTRAVENOUS | Status: DC | PRN
  Administered 2022-05-19: 200 mg via INTRAVENOUS

## 2022-05-19 MED ORDER — METOPROLOL TARTRATE 12.5 MG HALF TABLET
12.5000 mg | ORAL_TABLET | Freq: Two times a day (BID) | ORAL | Status: DC
  Administered 2022-05-19 – 2022-05-22 (×7): 12.5 mg via ORAL
  Filled 2022-05-19 (×6): qty 1
  Filled 2022-05-19: qty 0.5

## 2022-05-19 MED ORDER — OXYCODONE HCL 5 MG PO TABS: 5.0000 mg | ORAL_TABLET | Freq: Once | ORAL | Status: DC | PRN

## 2022-05-19 MED ORDER — ORAL CARE MOUTH RINSE
15.0000 mL | Freq: Once | OROMUCOSAL | Status: AC
Start: 2022-05-19 — End: 2022-05-19

## 2022-05-19 MED ORDER — SODIUM CHLORIDE 0.9 % IV SOLN
2.0000 g | Freq: Three times a day (TID) | INTRAVENOUS | Status: DC
  Administered 2022-05-19 – 2022-05-20 (×2): 2 g via INTRAVENOUS
  Filled 2022-05-19 (×2): qty 12.5

## 2022-05-19 MED ORDER — HYDROMORPHONE HCL 1 MG/ML IJ SOLN
INTRAMUSCULAR | Status: AC
  Filled 2022-05-19: qty 1

## 2022-05-19 MED ORDER — VANCOMYCIN HCL 1750 MG/350ML IV SOLN
1750.0000 mg | Freq: Once | INTRAVENOUS | Status: AC
  Administered 2022-05-19: 1750 mg via INTRAVENOUS
  Filled 2022-05-19: qty 350

## 2022-05-19 MED ORDER — FENTANYL CITRATE (PF) 250 MCG/5ML IJ SOLN
INTRAMUSCULAR | Status: DC | PRN
  Administered 2022-05-19: 100 ug via INTRAVENOUS

## 2022-05-19 MED ORDER — SUCCINYLCHOLINE CHLORIDE 200 MG/10ML IV SOSY
PREFILLED_SYRINGE | INTRAVENOUS | Status: AC
  Filled 2022-05-19: qty 10

## 2022-05-19 MED ORDER — VANCOMYCIN HCL IN DEXTROSE 1-5 GM/200ML-% IV SOLN: 1000.0000 mg | Freq: Once | INTRAVENOUS | Status: DC

## 2022-05-19 MED ORDER — INSULIN ASPART 100 UNIT/ML IJ SOLN
0.0000 [IU] | INTRAMUSCULAR | Status: DC | PRN
  Filled 2022-05-19: qty 1

## 2022-05-19 MED ORDER — DEXAMETHASONE SODIUM PHOSPHATE 10 MG/ML IJ SOLN
INTRAMUSCULAR | Status: DC | PRN
  Administered 2022-05-19: 5 mg via INTRAVENOUS

## 2022-05-19 MED ORDER — OXYCODONE HCL 5 MG/5ML PO SOLN: 5.0000 mg | Freq: Once | ORAL | Status: DC | PRN

## 2022-05-19 MED ORDER — PHENYLEPHRINE HCL-NACL 20-0.9 MG/250ML-% IV SOLN
INTRAVENOUS | Status: DC | PRN
  Administered 2022-05-19: 50 ug/min via INTRAVENOUS

## 2022-05-19 MED ORDER — INSULIN ASPART 100 UNIT/ML IJ SOLN
0.0000 [IU] | INTRAMUSCULAR | Status: DC
  Administered 2022-05-19: 7 [IU] via SUBCUTANEOUS
  Administered 2022-05-19: 2 [IU] via SUBCUTANEOUS
  Administered 2022-05-19: 9 [IU] via SUBCUTANEOUS
  Administered 2022-05-20: 3 [IU] via SUBCUTANEOUS
  Administered 2022-05-20: 2 [IU] via SUBCUTANEOUS
  Administered 2022-05-20: 7 [IU] via SUBCUTANEOUS

## 2022-05-19 MED ORDER — FENTANYL CITRATE PF 50 MCG/ML IJ SOSY
25.0000 ug | PREFILLED_SYRINGE | INTRAMUSCULAR | Status: DC | PRN
  Administered 2022-05-19 – 2022-05-20 (×3): 25 ug via INTRAVENOUS
  Filled 2022-05-19 (×3): qty 1

## 2022-05-19 MED ORDER — LIDOCAINE 2% (20 MG/ML) 5 ML SYRINGE
INTRAMUSCULAR | Status: DC | PRN
  Administered 2022-05-19: 40 mg via INTRAVENOUS

## 2022-05-19 MED ORDER — PROPOFOL 10 MG/ML IV BOLUS
INTRAVENOUS | Status: AC
  Filled 2022-05-19: qty 20

## 2022-05-19 MED ORDER — FENTANYL CITRATE (PF) 250 MCG/5ML IJ SOLN
INTRAMUSCULAR | Status: AC
  Filled 2022-05-19: qty 5

## 2022-05-19 MED ORDER — ACETAMINOPHEN 650 MG RE SUPP: 650.0000 mg | Freq: Four times a day (QID) | RECTAL | Status: DC | PRN

## 2022-05-19 MED ORDER — ESMOLOL HCL 100 MG/10ML IV SOLN
INTRAVENOUS | Status: DC | PRN
  Administered 2022-05-19: 10 mg via INTRAVENOUS
  Administered 2022-05-19 (×4): 20 mg via INTRAVENOUS

## 2022-05-19 MED ORDER — HYDROMORPHONE HCL 1 MG/ML IJ SOLN
0.2500 mg | INTRAMUSCULAR | Status: DC | PRN
  Administered 2022-05-19 (×2): 0.5 mg via INTRAVENOUS

## 2022-05-19 MED ORDER — DEXAMETHASONE SODIUM PHOSPHATE 10 MG/ML IJ SOLN
INTRAMUSCULAR | Status: AC
  Filled 2022-05-19: qty 1

## 2022-05-19 MED ORDER — INSULIN ASPART 100 UNIT/ML IJ SOLN
0.0000 [IU] | INTRAMUSCULAR | Status: DC | PRN
  Administered 2022-05-19: 4 [IU] via SUBCUTANEOUS

## 2022-05-19 MED ORDER — INSULIN GLARGINE-YFGN 100 UNIT/ML ~~LOC~~ SOLN
20.0000 [IU] | Freq: Every day | SUBCUTANEOUS | Status: DC
Start: 2022-05-19 — End: 2022-05-20
  Administered 2022-05-19: 20 [IU] via SUBCUTANEOUS
  Filled 2022-05-19 (×2): qty 0.2

## 2022-05-19 MED ORDER — ACETAMINOPHEN 325 MG PO TABS
650.0000 mg | ORAL_TABLET | Freq: Four times a day (QID) | ORAL | Status: DC | PRN
  Administered 2022-05-19 – 2022-05-21 (×3): 650 mg via ORAL
  Filled 2022-05-19 (×4): qty 2

## 2022-05-19 MED ORDER — AMISULPRIDE (ANTIEMETIC) 5 MG/2ML IV SOLN
INTRAVENOUS | Status: AC
  Filled 2022-05-19: qty 2

## 2022-05-19 MED ORDER — ROCURONIUM BROMIDE 10 MG/ML (PF) SYRINGE
PREFILLED_SYRINGE | INTRAVENOUS | Status: AC
  Filled 2022-05-19: qty 10

## 2022-05-19 MED ORDER — LIDOCAINE 2% (20 MG/ML) 5 ML SYRINGE
INTRAMUSCULAR | Status: AC
  Filled 2022-05-19: qty 5

## 2022-05-19 SURGICAL SUPPLY — 66 items
BAG COUNTER SPONGE SURGICOUNT (BAG) ×3 IMPLANT
BAG SPNG CNTER NS LX DISP (BAG) ×1
BIT DRILL CALIBRATED 4.3MMX365 (DRILL) IMPLANT
BIT DRILL CROWE PNT TWST 4.5MM (DRILL) IMPLANT
BLADE SURG 10 STRL SS (BLADE) ×3 IMPLANT
BNDG ELASTIC 4X5.8 VLCR STR LF (GAUZE/BANDAGES/DRESSINGS) ×3 IMPLANT
BNDG ELASTIC 6X5.8 VLCR STR LF (GAUZE/BANDAGES/DRESSINGS) ×3 IMPLANT
BNDG GAUZE ELAST 4 BULKY (GAUZE/BANDAGES/DRESSINGS) ×3 IMPLANT
CLEANER TIP ELECTROSURG 2X2 (MISCELLANEOUS) ×3 IMPLANT
COVER MAYO STAND STRL (DRAPES) ×3 IMPLANT
COVER SURGICAL LIGHT HANDLE (MISCELLANEOUS) ×6 IMPLANT
CUFF TOURN SGL QUICK 34 (TOURNIQUET CUFF)
CUFF TOURN SGL QUICK 42 (TOURNIQUET CUFF) IMPLANT
CUFF TRNQT CYL 34X4.125X (TOURNIQUET CUFF) IMPLANT
DRAPE C-ARM 42X72 X-RAY (DRAPES) ×3 IMPLANT
DRAPE HALF SHEET 40X57 (DRAPES) ×7 IMPLANT
DRAPE IMP U-DRAPE 54X76 (DRAPES) ×3 IMPLANT
DRAPE U-SHAPE 47X51 STRL (DRAPES) ×3 IMPLANT
DRESSING MEPILEX FLEX 4X4 (GAUZE/BANDAGES/DRESSINGS) IMPLANT
DRILL CALIBRATED 4.3MMX365 (DRILL) ×2
DRILL CROWE POINT TWIST 4.5MM (DRILL) ×2
DRSG EMULSION OIL 3X3 NADH (GAUZE/BANDAGES/DRESSINGS) ×3 IMPLANT
DRSG MEPILEX FLEX 4X4 (GAUZE/BANDAGES/DRESSINGS) ×2
DURAPREP 26ML APPLICATOR (WOUND CARE) ×3 IMPLANT
ELECT REM PT RETURN 9FT ADLT (ELECTROSURGICAL) ×2
ELECTRODE REM PT RTRN 9FT ADLT (ELECTROSURGICAL) ×2 IMPLANT
GAUZE SPONGE 4X4 12PLY STRL (GAUZE/BANDAGES/DRESSINGS) ×3 IMPLANT
GAUZE SPONGE 4X4 12PLY STRL LF (GAUZE/BANDAGES/DRESSINGS) ×1 IMPLANT
GAUZE XEROFORM 5X9 LF (GAUZE/BANDAGES/DRESSINGS) ×1 IMPLANT
GLOVE BIOGEL PI IND STRL 9 (GLOVE) ×2 IMPLANT
GLOVE BIOGEL PI INDICATOR 9 (GLOVE) ×1
GLOVE SURG ORTHO 9.0 STRL STRW (GLOVE) ×3 IMPLANT
GOWN STRL REUS W/ TWL XL LVL3 (GOWN DISPOSABLE) ×6 IMPLANT
GOWN STRL REUS W/TWL XL LVL3 (GOWN DISPOSABLE) ×6
GUIDEPIN VERSANAIL DSP 3.2X444 (ORTHOPEDIC DISPOSABLE SUPPLIES) ×1 IMPLANT
GUIDEWIRE NATURAL NAIL 3X100 (WIRE) ×1 IMPLANT
KIT BASIN OR (CUSTOM PROCEDURE TRAY) ×3 IMPLANT
KIT TURNOVER KIT B (KITS) ×3 IMPLANT
MANIFOLD NEPTUNE II (INSTRUMENTS) ×3 IMPLANT
NAIL FEM RETRO 10.5X340 (Nail) ×1 IMPLANT
NDL HYPO 21X1.5 SAFETY (NEEDLE) ×2 IMPLANT
NDL HYPO 25GX1X1/2 BEV (NEEDLE) ×2 IMPLANT
NEEDLE HYPO 21X1.5 SAFETY (NEEDLE) ×2 IMPLANT
NEEDLE HYPO 25GX1X1/2 BEV (NEEDLE) ×2 IMPLANT
NS IRRIG 1000ML POUR BTL (IV SOLUTION) ×3 IMPLANT
PACK ORTHO EXTREMITY (CUSTOM PROCEDURE TRAY) ×3 IMPLANT
PACK UNIVERSAL I (CUSTOM PROCEDURE TRAY) ×3 IMPLANT
PAD ARMBOARD 7.5X6 YLW CONV (MISCELLANEOUS) ×6 IMPLANT
PENCIL BUTTON HOLSTER BLD 10FT (ELECTRODE) ×3 IMPLANT
SCREW CORT TI DBL LEAD 5X30 (Screw) ×2 IMPLANT
SCREW CORT TI DBL LEAD 5X75 (Screw) ×1 IMPLANT
SCREW CORT TI DBL LEAD 5X80 (Screw) ×1 IMPLANT
SCREW CORT TI DBL LEAD 5X85 (Screw) ×1 IMPLANT
SPONGE T-LAP 18X18 ~~LOC~~+RFID (SPONGE) ×3 IMPLANT
STAPLER VISISTAT 35W (STAPLE) ×3 IMPLANT
SUCTION FRAZIER HANDLE 10FR (MISCELLANEOUS) ×2
SUCTION TUBE FRAZIER 10FR DISP (MISCELLANEOUS) ×2 IMPLANT
SUT VIC AB 0 CT1 27 (SUTURE) ×2
SUT VIC AB 0 CT1 27XBRD ANBCTR (SUTURE) ×2 IMPLANT
SUT VIC AB 2-0 CTB1 (SUTURE) ×3 IMPLANT
SYR BULB IRRIG 60ML STRL (SYRINGE) ×3 IMPLANT
SYR CONTROL 10ML LL (SYRINGE) ×3 IMPLANT
TOWEL GREEN STERILE (TOWEL DISPOSABLE) ×3 IMPLANT
TOWEL GREEN STERILE FF (TOWEL DISPOSABLE) ×3 IMPLANT
TUBE CONNECTING 12X1/4 (SUCTIONS) ×3 IMPLANT
WATER STERILE IRR 1000ML POUR (IV SOLUTION) ×3 IMPLANT

## 2022-05-19 NOTE — Interval H&P Note (Signed)
History and Physical Interval Note:  05/19/2022 12:12 PM  [name]  has presented today for surgery, with the diagnosis of Left Femur Fx.  The various methods of treatment have been discussed with the patient and family. After consideration of risks, benefits and other options for treatment, the patient has consented to  Procedure(s): INTRAMEDULLARY (IM) RETROGRADE FEMORAL NAILING (Left) as a surgical intervention.  The patient's history has been reviewed, patient examined, no change in status, stable for surgery.  I have reviewed the patient's chart and labs.  Questions were answered to the patient's satisfaction.     Huel Cote

## 2022-05-19 NOTE — OR PostOp (Incomplete)
PACU TO INPATIENT HANDOFF REPORT  Name/Age/Gender [name] 76 y.o. female  Code Status    Code Status Orders  (From admission, onward)           Start     Ordered   05/19/22 0538  Full code  Continuous        05/19/22 0537           Code Status History     Date Active Date Inactive Code Status Order ID Comments User Context   07/09/2021 1634 07/14/2021 0416 Full Code [id number]  Lequita Halt, MD ED   03/14/2021 1522 03/18/2021 2245 Full Code [id number]  Harold Hedge, MD ED   05/11/2020 1051 05/19/2020 2033 Full Code [id number]  Norval Morton, MD ED   05/05/2020 2113 05/10/2020 1517 Full Code [id number]  Darreld Mclean, PA-C Inpatient   12/19/2014 1319 12/22/2014 1727 Full Code [id number]  Florencia Reasons, MD Inpatient   03/26/2014 1156 03/31/2014 1444 Full Code [id number]  Otho Bellows, MD Inpatient       Home/SNF/Other {Discharge Destination:18313::"Home"}  Chief Complaint Femur fracture ([place]) [S72.90XA] Fall, initial encounter [W19.XXXA] Closed displaced supracondylar fracture of distal end of left femur with intracondylar extension, initial encounter ([place]) [S72.462A]  Level of Care/Admitting Diagnosis ED Disposition     ED Disposition  Admit   Condition  --   [place]: [place] [[id number]]  Level of Care: Telemetry Medical [104]  May admit patient to [facility] or [facility] if equivalent level of care is available:: No  Covid Evaluation: Asymptomatic - no recent exposure (last 10 days) testing not required  [facility]: [id number]  Admitting Physician: Rise Patience [phone]  Attending Physician: Rise Patience [phone]  Estimated length of stay: past midnight tomorrow  Certification:: I certify this patient will need inpatient services for at least 2 midnights          Medical History Past Medical History:  Diagnosis Date   Achilles tendinitis    Acute renal failure (ARF) ([place])  12/19/2014   Arthritis    Calcaneal spur    right   Cataract    Depression    Diabetes mellitus    type II   Diverticulosis of colon (without mention of hemorrhage) 2013   Dyspnea    GERD (gastroesophageal reflux disease)    GI bleed 03/26/2014   Hyperlipidemia    Hypertension    Internal hemorrhoids 2013   Peripheral neuropathy    Right shoulder pain    Subacromial tendinitis   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 Cyndia Bent and Burt Knack   Venous insufficiency    Ventral hernia     Allergies Allergies  Allergen Reactions   Morphine Other (See Comments)    'took me out of this world' I had to be resuscitated   Codeine Sulfate Nausea Only and Other (See Comments)    GI upset and pain. "When to sleep and did not wake up".    Demerol [Meperidine] Itching, Nausea And Vomiting and Swelling   Propoxyphene Hcl Other (See Comments)    Darvocet caused sick headache/swelling     IV Location/Drains/Wounds Patient Lines/Drains/Airways Status     Active Line/Drains/Airways     Name Placement date Placement time Site Days   Peripheral IV 05/19/22 22 G Left Forearm 05/19/22  0025  Forearm  less than  1   External Urinary Catheter 07/11/21  --  --  312   Incision (Closed) 05/19/22 Thigh Left 05/19/22  1343  -- less than 1   Pressure Injury 05/11/20 Coccyx Medial Stage 2 -  Partial thickness loss of dermis presenting as a shallow open injury with a red, pink wound bed without slough. Shallow open area between buttocks 05/11/20  1500  -- 738   Pressure Injury 07/09/21 Sacrum Unstageable - Full thickness tissue loss in which the base of the injury is covered by slough (yellow, tan, gray, green or brown) and/or eschar (tan, brown or black) in the wound bed. 4 areas of Unstageable pressure injuri 07/09/21  2000  -- 314            Labs/Imaging Results for orders placed or performed during the hospital  encounter of 05/18/22 (from the past 48 hour(s))  Comprehensive metabolic panel     Status: Abnormal   Collection Time: 05/18/22 10:38 PM  Result Value Ref Range   Sodium 135 135 - 145 mmol/L   Potassium 4.4 3.5 - 5.1 mmol/L   Chloride 101 98 - 111 mmol/L   CO2 21 (L) 22 - 32 mmol/L   Glucose, Bld 355 (H) 70 - 99 mg/dL    Comment: Glucose reference range applies only to samples taken after fasting for at least 8 hours.   BUN 21 8 - 23 mg/dL   Creatinine, Ser 0.75 0.44 - 1.00 mg/dL   Calcium 9.0 8.9 - 10.3 mg/dL   Total Protein 6.7 6.5 - 8.1 g/dL   Albumin 2.8 (L) 3.5 - 5.0 g/dL   AST 26 15 - 41 U/L   ALT 29 0 - 44 U/L   Alkaline Phosphatase 168 (H) 38 - 126 U/L   Total Bilirubin 0.3 0.3 - 1.2 mg/dL   GFR, Estimated >60 >60 mL/min    Comment: (NOTE) Calculated using the CKD-EPI Creatinine Equation (2021)    Anion gap 13 5 - 15    Comment: Performed at [facility] [address], [place], [place] [address]  Urinalysis, Routine w reflex microscopic Urine, Clean Catch     Status: Abnormal   Collection Time: 05/18/22 11:21 PM  Result Value Ref Range   Color, Urine STRAW (A) YELLOW   APPearance CLEAR CLEAR   Specific Gravity, Urine 1.007 1.005 - 1.030   pH 5.0 5.0 - 8.0   Glucose, UA >=500 (A) NEGATIVE mg/dL   Hgb urine dipstick SMALL (A) NEGATIVE   Bilirubin Urine NEGATIVE NEGATIVE   Ketones, ur NEGATIVE NEGATIVE mg/dL   Protein, ur NEGATIVE NEGATIVE mg/dL   Nitrite NEGATIVE NEGATIVE   Leukocytes,Ua SMALL (A) NEGATIVE   RBC / HPF 0-5 0 - 5 RBC/hpf   WBC, UA 0-5 0 - 5 WBC/hpf   Bacteria, UA NONE SEEN NONE SEEN   Squamous Epithelial / LPF 0-5 0 - 5   Hyaline Casts, UA PRESENT     Comment: Performed at [facility], [place] [address], [place], [place] [address]  CBG monitoring, ED     Status: Abnormal   Collection Time: 05/19/22  1:30 AM  Result Value Ref Range   Glucose-Capillary 315 (H) 70 - 99 mg/dL    Comment: Glucose reference range applies only to samples  taken after fasting for at least 8 hours.   Comment 1 Notify RN    Comment 2 Document in Chart   CBC with Differential/Platelet     Status: Abnormal   Collection  Time: 05/19/22  2:18 AM  Result Value Ref Range   WBC 20.6 (H) 4.0 - 10.5 K/uL   RBC 4.63 3.87 - 5.11 MIL/uL   Hemoglobin 13.1 12.0 - 15.0 g/dL   HCT 39.0 36.0 - 46.0 %   MCV 84.2 80.0 - 100.0 fL   MCH 28.3 26.0 - 34.0 pg   MCHC 33.6 30.0 - 36.0 g/dL   RDW 12.8 11.5 - 15.5 %   Platelets 202 150 - 400 K/uL   nRBC 0.0 0.0 - 0.2 %   Neutrophils Relative % 85 %   Neutro Abs 17.6 (H) 1.7 - 7.7 K/uL   Lymphocytes Relative 5 %   Lymphs Abs 1.1 0.7 - 4.0 K/uL   Monocytes Relative 7 %   Monocytes Absolute 1.4 (H) 0.1 - 1.0 K/uL   Eosinophils Relative 2 %   Eosinophils Absolute 0.3 0.0 - 0.5 K/uL   Basophils Relative 1 %   Basophils Absolute 0.1 0.0 - 0.1 K/uL   Immature Granulocytes 0 %   Abs Immature Granulocytes 0.09 (H) 0.00 - 0.07 K/uL    Comment: Performed at [facility], [address], [place], [place] [address]  Lactic acid, plasma     Status: Abnormal   Collection Time: 05/19/22  5:00 AM  Result Value Ref Range   Lactic Acid, Venous 2.0 (HH) 0.5 - 1.9 mmol/L    Comment: CRITICAL RESULT CALLED TO, READ BACK BY AND VERIFIED WITH: Loren Racer, RN [phone] 06.11.23 M. Rivet Performed at [facility], [place] [address] [place],  [address]   CBG monitoring, ED     Status: Abnormal   Collection Time: 05/19/22  7:51 AM  Result Value Ref Range   Glucose-Capillary 333 (H) 70 - 99 mg/dL    Comment: Glucose reference range applies only to samples taken after fasting for at least 8 hours.  Glucose, capillary     Status: Abnormal   Collection Time: 05/19/22 11:31 AM  Result Value Ref Range   Glucose-Capillary 287 (H) 70 - 99 mg/dL    Comment: Glucose reference range applies only to samples taken after fasting for at least 8 hours.  Glucose, capillary     Status: Abnormal   Collection Time: 05/19/22  2:12 PM   Result Value Ref Range   Glucose-Capillary 187 (H) 70 - 99 mg/dL    Comment: Glucose reference range applies only to samples taken after fasting for at least 8 hours.   DG FEMUR MIN 2 VIEWS LEFT  Result Date: 05/19/2022 CLINICAL DATA:  Fluoroscopic assistance for internal fixation of fracture of distal left femur EXAM: LEFT FEMUR 2 VIEWS COMPARISON:  05/18/2022 FINDINGS: Fluoroscopic images show internal fixation of comminuted fracture of distal metaphyseal region of left femur. There is interval placement of intramedullary rod. There is improvement in alignment of fracture fragments. Fluoroscopic time was 1 minute and 43 seconds. Radiation dose was 12.75 mGy. IMPRESSION: Fluoroscopic assistance was provided for internal fixation of fracture of distal left femur. Electronically Signed   By: Elmer Picker M.D.   On: 05/19/2022 14:32   DG C-Arm 1-60 Min-No Report  Result Date: 05/19/2022 Fluoroscopy was utilized by the requesting physician.  No radiographic interpretation.   CT Knee Left Wo Contrast  Result Date: 05/19/2022 CLINICAL DATA:  Knee fracture. EXAM: CT OF THE LEFT KNEE WITHOUT CONTRAST TECHNIQUE: Multidetector CT imaging of the left knee was performed according to the standard protocol. Multiplanar CT image reconstructions were also generated. RADIATION DOSE REDUCTION:  This exam was performed according to the departmental dose-optimization program which includes automated exposure control, adjustment of the mA and/or kV according to patient size and/or use of iterative reconstruction technique. COMPARISON:  05/18/2022. FINDINGS: Bones/Joint/Cartilage There is a comminuted fracture of the supracondylar femur with intra-articular extension and mild posterior displacement of the distal fracture fragment. The remaining bony structures are intact. There are moderate degenerative changes in the patellofemoral and medial compartments. Mild degenerative changes are noted in the lateral  compartment. Enthesopathic changes are noted at the patella. There is a moderate joint effusion. Ligaments Suboptimally assessed by CT. Muscles and Tendons No intramuscular hematoma.  The visualized tendons appear intact. Soft tissues Diffuse subcutaneous edema about the knee. Vascular calcifications are noted in the soft soft tissues. IMPRESSION: 1. Comminuted fracture of the supracondylar distal femur with intra-articular extension and mild posterior displacement of the distal fracture fragment. 2. Moderate joint effusion. 3. Mild-to-moderate degenerative changes at the knee. Electronically Signed   By: Thornell Sartorius M.D.   On: 05/19/2022 02:08   CT CHEST ABDOMEN PELVIS W CONTRAST  Result Date: 05/19/2022 CLINICAL DATA:  Fall, hit back of head. EXAM: CT CHEST, ABDOMEN, AND PELVIS WITH CONTRAST TECHNIQUE: Multidetector CT imaging of the chest, abdomen and pelvis was performed following the standard protocol during bolus administration of intravenous contrast. RADIATION DOSE REDUCTION: This exam was performed according to the departmental dose-optimization program which includes automated exposure control, adjustment of the mA and/or kV according to patient size and/or use of iterative reconstruction technique. CONTRAST:  OMNIPAQUE IOHEXOL 300 MG/ML  SOLN COMPARISON:  05/12/2020 FINDINGS: CT CHEST FINDINGS Cardiovascular: Heart is normal size. Aorta is normal caliber. Prior aortic valve repair. Coronary artery and aortic calcifications. Mediastinum/Nodes: No mediastinal, hilar, or axillary adenopathy. Trachea and esophagus are unremarkable. Thyroid unremarkable. Lungs/Pleura: Lungs are clear. No focal airspace opacities or suspicious nodules. No effusions. No pneumothorax Musculoskeletal: No acute bony abnormality. CT ABDOMEN PELVIS FINDINGS Hepatobiliary: Prior cholecystectomy. No focal hepatic abnormality or perihepatic hematoma. Pancreas: No focal abnormality or ductal dilatation. Spleen: No splenic  injury or perisplenic hematoma. Adrenals/Urinary Tract: No adrenal hemorrhage or renal injury identified. Bladder is unremarkable. Stomach/Bowel: Normal appendix. Stomach, large and small bowel grossly unremarkable. Vascular/Lymphatic: Aortic atherosclerosis. No evidence of aneurysm or adenopathy. Reproductive: Prior hysterectomy.  No adnexal masses. Other: No free fluid or free air.  Umbilical hernia containing fat. Musculoskeletal: No acute bony abnormality. Stable chronic L1 compression fracture. IMPRESSION: No acute findings or evidence of acute traumatic injury in the chest, abdomen or pelvis. Coronary artery disease, aortic atherosclerosis. Electronically Signed   By: Charlett Nose M.D.   On: 05/19/2022 01:53   CT CERVICAL SPINE WO CONTRAST  Result Date: 05/19/2022 CLINICAL DATA:  Fall, hit EXAM: CT CERVICAL SPINE WITHOUT CONTRAST TECHNIQUE: Multidetector CT imaging of the cervical spine was performed without intravenous contrast. Multiplanar CT image reconstructions were also generated. RADIATION DOSE REDUCTION: This exam was performed according to the departmental dose-optimization program which includes automated exposure control, adjustment of the mA and/or kV according to patient size and/or use of iterative reconstruction technique. COMPARISON:  05/10/2020 FINDINGS: Alignment: Normal Skull base and vertebrae: No acute fracture. No primary bone lesion or focal pathologic process. Soft tissues and spinal canal: No prevertebral fluid or swelling. No visible canal hematoma. Disc levels:  Diffuse degenerative disc disease and facet disease. Upper chest: No acute findings Other: None IMPRESSION: Degenerative changes.  No acute bony abnormality. Electronically Signed   By: Charlett Nose M.D.  On: 05/19/2022 01:48   CT HEAD WO CONTRAST  Result Date: 05/19/2022 CLINICAL DATA:  Fall, hit back of head EXAM: CT HEAD WITHOUT CONTRAST TECHNIQUE: Contiguous axial images were obtained from the base of the skull  through the vertex without intravenous contrast. RADIATION DOSE REDUCTION: This exam was performed according to the departmental dose-optimization program which includes automated exposure control, adjustment of the mA and/or kV according to patient size and/or use of iterative reconstruction technique. COMPARISON:  05/10/2020 FINDINGS: Brain: No acute intracranial abnormality. Specifically, no hemorrhage, hydrocephalus, mass lesion, acute infarction, or significant intracranial injury. Vascular: No hyperdense vessel or unexpected calcification. Skull: No acute calvarial abnormality. Sinuses/Orbits: No acute findings Other: None IMPRESSION: No acute intracranial abnormality. Electronically Signed   By: Rolm Baptise M.D.   On: 05/19/2022 01:47   DG Hip Unilat With Pelvis 2-3 Views Left  Result Date: 05/18/2022 CLINICAL DATA:  Status post fall. EXAM: DG HIP (WITH OR WITHOUT PELVIS) 2-3V LEFT COMPARISON:  None Available. FINDINGS: There is no evidence of hip fracture or dislocation. Degenerative changes are seen in the form of joint space narrowing and acetabular sclerosis. IMPRESSION: No acute osseous abnormality. Electronically Signed   By: Virgina Norfolk M.D.   On: 05/18/2022 23:17   DG Knee Complete 4 Views Left  Result Date: 05/18/2022 CLINICAL DATA:  Status post fall. EXAM: LEFT KNEE - COMPLETE 4+ VIEW COMPARISON:  None Available. FINDINGS: An acute fracture deformity is seen extending through the supracondylar region of the distal left femur. Approximately 1/2 shaft width posterior displacement of the distal fracture site is seen. There is no evidence of dislocation. A large joint effusion is seen. IMPRESSION: Acute fracture of the distal left femur with an associated large joint effusion. Electronically Signed   By: Virgina Norfolk M.D.   On: 05/18/2022 23:16   DG Chest Port 1 View  Result Date: 05/18/2022 CLINICAL DATA:  Fall EXAM: PORTABLE CHEST 1 VIEW COMPARISON:  None Available. FINDINGS:  Prior aortic valve repair. Heart is upper limits normal in size. Aortic calcifications. Mediastinal contours within normal limits. No confluent opacities or effusions. No pneumothorax. No acute bony abnormality. IMPRESSION: No active disease. Electronically Signed   By: Rolm Baptise M.D.   On: 05/18/2022 23:15    Pending Labs   Vitals/Pain Today's Vitals   05/19/22 1425 05/19/22 1440 05/19/22 1455 05/19/22 1510  BP: 98/74 (!) 104/51  (!) 110/40  Pulse: 78  94 95  Resp: 18 16 16 18   Temp:    98.3 F (36.8 C)  TempSrc:      SpO2: 96% 98% 98% 96%  Weight:      Height:      PainSc: 7  2  2  2      Isolation Precautions @ISOLATION @  Administered Medications Periop Administered Meds from 05/19/2022 1121 to 05/19/2022 1522       Date/Time Order Dose Route Action Action by Comments    05/19/2022 1121 EDT acetaminophen (TYLENOL) suppository 650 mg -- Rectal MAR Hold Transfer Provider, Automatic --    05/19/2022 1121 EDT acetaminophen (TYLENOL) tablet 650 mg -- Oral MAR Hold Transfer Provider, Automatic --    05/19/2022 1311 EDT albumin human 5 % solution 0  Intravenous Stopped Lavell Luster, CRNA --    05/19/2022 1300 EDT albumin human 5 % solution -- Intravenous Anesthesia Volume Adjustment Lavell Luster, CRNA --    05/19/2022 1235 EDT albumin human 5 % solution -- Intravenous New Bag/Given Lavell Luster, CRNA --  05/19/2022 1445 EDT amisulpride (BARHEMSYS) injection 5 mg 5 mg Intravenous Given Ulice Dash, RN --    05/27/2022 2000 EDT ceFEPIme (MAXIPIME) 2 g in sodium chloride 0.9 % 100 mL IVPB -- Intravenous Automatically Held Transfer Provider, Automatic --    05/27/2022 1200 EDT ceFEPIme (MAXIPIME) 2 g in sodium chloride 0.9 % 100 mL IVPB -- Intravenous Automatically Held Transfer Provider, Automatic --    05/27/2022 0400 EDT ceFEPIme (MAXIPIME) 2 g in sodium chloride 0.9 % 100 mL IVPB -- Intravenous Automatically Held Transfer Provider, Automatic --    05/26/2022 2000 EDT  ceFEPIme (MAXIPIME) 2 g in sodium chloride 0.9 % 100 mL IVPB -- Intravenous Automatically Held Transfer Provider, Automatic --    05/26/2022 1200 EDT ceFEPIme (MAXIPIME) 2 g in sodium chloride 0.9 % 100 mL IVPB -- Intravenous Automatically Held Transfer Provider, Automatic --    05/26/2022 0400 EDT ceFEPIme (MAXIPIME) 2 g in sodium chloride 0.9 % 100 mL IVPB -- Intravenous Automatically Held Transfer Provider, Automatic --    05/25/2022 2000 EDT ceFEPIme (MAXIPIME) 2 g in sodium chloride 0.9 % 100 mL IVPB -- Intravenous Automatically Held Transfer Provider, Automatic --    05/25/2022 1200 EDT ceFEPIme (MAXIPIME) 2 g in sodium chloride 0.9 % 100 mL IVPB -- Intravenous Automatically Held Transfer Provider, Automatic --    05/25/2022 0400 EDT ceFEPIme (MAXIPIME) 2 g in sodium chloride 0.9 % 100 mL IVPB -- Intravenous Automatically Held Transfer Provider, Automatic --    05/24/2022 2000 EDT ceFEPIme (MAXIPIME) 2 g in sodium chloride 0.9 % 100 mL IVPB -- Intravenous Automatically Held Transfer Provider, Automatic --    05/24/2022 1200 EDT ceFEPIme (MAXIPIME) 2 g in sodium chloride 0.9 % 100 mL IVPB -- Intravenous Automatically Held Transfer Provider, Automatic --    05/24/2022 0400 EDT ceFEPIme (MAXIPIME) 2 g in sodium chloride 0.9 % 100 mL IVPB -- Intravenous Automatically Held Transfer Provider, Automatic --    05/23/2022 2000 EDT ceFEPIme (MAXIPIME) 2 g in sodium chloride 0.9 % 100 mL IVPB -- Intravenous Automatically Held Transfer Provider, Automatic --    05/23/2022 1200 EDT ceFEPIme (MAXIPIME) 2 g in sodium chloride 0.9 % 100 mL IVPB -- Intravenous Automatically Held Transfer Provider, Automatic --    05/23/2022 0400 EDT ceFEPIme (MAXIPIME) 2 g in sodium chloride 0.9 % 100 mL IVPB -- Intravenous Automatically Held Transfer Provider, Automatic --    05/22/2022 2000 EDT ceFEPIme (MAXIPIME) 2 g in sodium chloride 0.9 % 100 mL IVPB -- Intravenous Automatically Held Transfer Provider, Automatic --     05/22/2022 1200 EDT ceFEPIme (MAXIPIME) 2 g in sodium chloride 0.9 % 100 mL IVPB -- Intravenous Automatically Held Transfer Provider, Automatic --    05/22/2022 0400 EDT ceFEPIme (MAXIPIME) 2 g in sodium chloride 0.9 % 100 mL IVPB -- Intravenous Automatically Held Transfer Provider, Automatic --    05/21/2022 2000 EDT ceFEPIme (MAXIPIME) 2 g in sodium chloride 0.9 % 100 mL IVPB -- Intravenous Automatically Held Transfer Provider, Automatic --    05/21/2022 1200 EDT ceFEPIme (MAXIPIME) 2 g in sodium chloride 0.9 % 100 mL IVPB -- Intravenous Automatically Held Transfer Provider, Automatic --    05/21/2022 0400 EDT ceFEPIme (MAXIPIME) 2 g in sodium chloride 0.9 % 100 mL IVPB -- Intravenous Automatically Held Transfer Provider, Automatic --    05/20/2022 2000 EDT ceFEPIme (MAXIPIME) 2 g in sodium chloride 0.9 % 100 mL IVPB -- Intravenous Automatically Held Transfer Provider, Automatic --    05/20/2022 1200 EDT ceFEPIme (MAXIPIME) 2 g  in sodium chloride 0.9 % 100 mL IVPB -- Intravenous Automatically Held Wellsite geologist, Automatic --    05/20/2022 0400 EDT ceFEPIme (MAXIPIME) 2 g in sodium chloride 0.9 % 100 mL IVPB -- Intravenous Automatically Held Transfer Provider, Automatic --    05/19/2022 2000 EDT ceFEPIme (MAXIPIME) 2 g in sodium chloride 0.9 % 100 mL IVPB -- Intravenous Automatically Held Transfer Provider, Automatic --    05/19/2022 1200 EDT ceFEPIme (MAXIPIME) 2 g in sodium chloride 0.9 % 100 mL IVPB -- Intravenous Automatically Held Transfer Provider, Automatic --    05/19/2022 1121 EDT ceFEPIme (MAXIPIME) 2 g in sodium chloride 0.9 % 100 mL IVPB -- Intravenous MAR Hold Transfer Provider, Automatic --    05/19/2022 1154 EDT chlorhexidine (PERIDEX) 0.12 % solution 15 mL 15 mL Mouth/Throat Given Lorin Glass, RN --    05/19/2022 1248 EDT dexamethasone (DECADRON) injection 5 mg Intravenous Given Lavell Luster, CRNA --    05/19/2022 1357 EDT esmolol (BREVIBLOC) injection 20 mg Intravenous  Given Lavell Luster, CRNA --    05/19/2022 1356 EDT esmolol (BREVIBLOC) injection 20 mg Intravenous Given Lavell Luster, CRNA --    05/19/2022 1355 EDT esmolol (BREVIBLOC) injection 20 mg Intravenous Given Lavell Luster, CRNA --    05/19/2022 1353 EDT esmolol (BREVIBLOC) injection 10 mg Intravenous Given Lavell Luster, CRNA --    05/19/2022 1352 EDT esmolol (BREVIBLOC) injection 20 mg Intravenous Given Lavell Luster, CRNA --    05/19/2022 1121 EDT fentaNYL (SUBLIMAZE) injection 25 mcg -- Intravenous MAR Hold Transfer Provider, Automatic --    05/19/2022 1230 EDT fentaNYL citrate (PF) (SUBLIMAZE) injection 100 mcg Intravenous Given Lavell Luster, CRNA --    05/19/2022 1427 EDT HYDROmorphone (DILAUDID) injection 0.25-0.5 mg 0.5 mg Intravenous Given Ulice Dash, RN --    05/19/2022 1420 EDT HYDROmorphone (DILAUDID) injection 0.25-0.5 mg 0.5 mg Intravenous Given Ulice Dash, RN --    05/19/2022 1154 EDT insulin aspart (novoLOG) injection 0-7 Units 4 Units Subcutaneous Given Lorin Glass, RN --    05/27/2022 2145 EDT insulin aspart (novoLOG) injection 0-9 Units -- Subcutaneous Automatically Held Transfer Provider, Automatic --    05/27/2022 1745 EDT insulin aspart (novoLOG) injection 0-9 Units -- Subcutaneous Automatically Held Transfer Provider, Automatic --    05/27/2022 1345 EDT insulin aspart (novoLOG) injection 0-9 Units -- Subcutaneous Automatically Held Transfer Provider, Automatic --    05/27/2022 0945 EDT insulin aspart (novoLOG) injection 0-9 Units -- Subcutaneous Automatically Held Transfer Provider, Automatic --    05/27/2022 0545 EDT insulin aspart (novoLOG) injection 0-9 Units -- Subcutaneous Automatically Held Transfer Provider, Automatic --    05/27/2022 0145 EDT insulin aspart (novoLOG) injection 0-9 Units -- Subcutaneous Automatically Held Transfer Provider, Automatic --    05/26/2022 2145 EDT insulin aspart (novoLOG) injection 0-9 Units -- Subcutaneous Automatically Held  Transfer Provider, Automatic --    05/26/2022 1745 EDT insulin aspart (novoLOG) injection 0-9 Units -- Subcutaneous Automatically Held Transfer Provider, Automatic --    05/26/2022 1345 EDT insulin aspart (novoLOG) injection 0-9 Units -- Subcutaneous Automatically Held Transfer Provider, Automatic --    05/26/2022 0945 EDT insulin aspart (novoLOG) injection 0-9 Units -- Subcutaneous Automatically Held Transfer Provider, Automatic --    05/26/2022 0545 EDT insulin aspart (novoLOG) injection 0-9 Units -- Subcutaneous Automatically Held Transfer Provider, Automatic --    05/26/2022 0145 EDT insulin aspart (novoLOG) injection 0-9 Units -- Subcutaneous Automatically Held Transfer Provider, Automatic --    05/25/2022 2145 EDT insulin aspart (novoLOG) injection  0-9 Units -- Subcutaneous Automatically Held Transfer Provider, Automatic --    05/25/2022 1745 EDT insulin aspart (novoLOG) injection 0-9 Units -- Subcutaneous Automatically Held Transfer Provider, Automatic --    05/25/2022 1345 EDT insulin aspart (novoLOG) injection 0-9 Units -- Subcutaneous Automatically Held Transfer Provider, Automatic --    05/25/2022 0945 EDT insulin aspart (novoLOG) injection 0-9 Units -- Subcutaneous Automatically Held Transfer Provider, Automatic --    05/25/2022 0545 EDT insulin aspart (novoLOG) injection 0-9 Units -- Subcutaneous Automatically Held Transfer Provider, Automatic --    05/25/2022 0145 EDT insulin aspart (novoLOG) injection 0-9 Units -- Subcutaneous Automatically Held Transfer Provider, Automatic --    05/24/2022 2145 EDT insulin aspart (novoLOG) injection 0-9 Units -- Subcutaneous Automatically Held Transfer Provider, Automatic --    05/24/2022 1745 EDT insulin aspart (novoLOG) injection 0-9 Units -- Subcutaneous Automatically Held Transfer Provider, Automatic --    05/24/2022 1345 EDT insulin aspart (novoLOG) injection 0-9 Units -- Subcutaneous Automatically Held Transfer Provider, Automatic --     05/24/2022 0945 EDT insulin aspart (novoLOG) injection 0-9 Units -- Subcutaneous Automatically Held Transfer Provider, Automatic --    05/24/2022 0545 EDT insulin aspart (novoLOG) injection 0-9 Units -- Subcutaneous Automatically Held Transfer Provider, Automatic --    05/24/2022 0145 EDT insulin aspart (novoLOG) injection 0-9 Units -- Subcutaneous Automatically Held Transfer Provider, Automatic --    05/23/2022 2145 EDT insulin aspart (novoLOG) injection 0-9 Units -- Subcutaneous Automatically Held Transfer Provider, Automatic --    05/23/2022 1745 EDT insulin aspart (novoLOG) injection 0-9 Units -- Subcutaneous Automatically Held Transfer Provider, Automatic --    05/23/2022 1345 EDT insulin aspart (novoLOG) injection 0-9 Units -- Subcutaneous Automatically Held Transfer Provider, Automatic --    05/23/2022 0945 EDT insulin aspart (novoLOG) injection 0-9 Units -- Subcutaneous Automatically Held Transfer Provider, Automatic --    05/23/2022 0545 EDT insulin aspart (novoLOG) injection 0-9 Units -- Subcutaneous Automatically Held Transfer Provider, Automatic --    05/23/2022 0145 EDT insulin aspart (novoLOG) injection 0-9 Units -- Subcutaneous Automatically Held Transfer Provider, Automatic --    05/22/2022 2145 EDT insulin aspart (novoLOG) injection 0-9 Units -- Subcutaneous Automatically Held Transfer Provider, Automatic --    05/22/2022 1745 EDT insulin aspart (novoLOG) injection 0-9 Units -- Subcutaneous Automatically Held Transfer Provider, Automatic --    05/22/2022 1345 EDT insulin aspart (novoLOG) injection 0-9 Units -- Subcutaneous Automatically Held Transfer Provider, Automatic --    05/22/2022 0945 EDT insulin aspart (novoLOG) injection 0-9 Units -- Subcutaneous Automatically Held Transfer Provider, Automatic --    05/22/2022 0545 EDT insulin aspart (novoLOG) injection 0-9 Units -- Subcutaneous Automatically Held Transfer Provider, Automatic --    05/22/2022 0145 EDT insulin aspart (novoLOG)  injection 0-9 Units -- Subcutaneous Automatically Held Transfer Provider, Automatic --    05/21/2022 2145 EDT insulin aspart (novoLOG) injection 0-9 Units -- Subcutaneous Automatically Held Transfer Provider, Automatic --    05/21/2022 1745 EDT insulin aspart (novoLOG) injection 0-9 Units -- Subcutaneous Automatically Held Transfer Provider, Automatic --    05/21/2022 1345 EDT insulin aspart (novoLOG) injection 0-9 Units -- Subcutaneous Automatically Held Transfer Provider, Automatic --    05/21/2022 0945 EDT insulin aspart (novoLOG) injection 0-9 Units -- Subcutaneous Automatically Held Transfer Provider, Automatic --    05/21/2022 0545 EDT insulin aspart (novoLOG) injection 0-9 Units -- Subcutaneous Automatically Held Transfer Provider, Automatic --    05/21/2022 0145 EDT insulin aspart (novoLOG) injection 0-9 Units -- Subcutaneous Automatically Held Transfer Provider, Automatic --    05/20/2022 2145 EDT insulin aspart (  novoLOG) injection 0-9 Units -- Subcutaneous Automatically Held Transfer Provider, Automatic --    05/20/2022 1745 EDT insulin aspart (novoLOG) injection 0-9 Units -- Subcutaneous Automatically Held Transfer Provider, Automatic --    05/20/2022 1345 EDT insulin aspart (novoLOG) injection 0-9 Units -- Subcutaneous Automatically Held Transfer Provider, Automatic --    05/20/2022 0945 EDT insulin aspart (novoLOG) injection 0-9 Units -- Subcutaneous Automatically Held Transfer Provider, Automatic --    05/20/2022 0545 EDT insulin aspart (novoLOG) injection 0-9 Units -- Subcutaneous Automatically Held Transfer Provider, Automatic --    05/20/2022 0145 EDT insulin aspart (novoLOG) injection 0-9 Units -- Subcutaneous Automatically Held Transfer Provider, Automatic --    05/19/2022 2145 EDT insulin aspart (novoLOG) injection 0-9 Units -- Subcutaneous Automatically Held Transfer Provider, Automatic --    05/19/2022 1745 EDT insulin aspart (novoLOG) injection 0-9 Units -- Subcutaneous  Automatically Held Transfer Provider, Automatic --    05/19/2022 1345 EDT insulin aspart (novoLOG) injection 0-9 Units -- Subcutaneous Automatically Held Transfer Provider, Automatic --    05/19/2022 1121 EDT insulin aspart (novoLOG) injection 0-9 Units -- Subcutaneous MAR Hold Transfer Provider, Automatic --    05/27/2022 1000 EDT insulin glargine-yfgn (SEMGLEE) injection 20 Units -- Subcutaneous Automatically Held Transfer Provider, Automatic --    05/26/2022 1000 EDT insulin glargine-yfgn (SEMGLEE) injection 20 Units -- Subcutaneous Automatically Held Transfer Provider, Automatic --    05/25/2022 1000 EDT insulin glargine-yfgn (SEMGLEE) injection 20 Units -- Subcutaneous Automatically Held Transfer Provider, Automatic --    05/24/2022 1000 EDT insulin glargine-yfgn (SEMGLEE) injection 20 Units -- Subcutaneous Automatically Held Transfer Provider, Automatic --    05/23/2022 1000 EDT insulin glargine-yfgn (SEMGLEE) injection 20 Units -- Subcutaneous Automatically Held Transfer Provider, Automatic --    05/22/2022 1000 EDT insulin glargine-yfgn (SEMGLEE) injection 20 Units -- Subcutaneous Automatically Held Transfer Provider, Automatic --    05/21/2022 1000 EDT insulin glargine-yfgn (SEMGLEE) injection 20 Units -- Subcutaneous Automatically Held Transfer Provider, Automatic --    05/20/2022 1000 EDT insulin glargine-yfgn (SEMGLEE) injection 20 Units -- Subcutaneous Automatically Held Transfer Provider, Automatic --    05/19/2022 1121 EDT insulin glargine-yfgn (SEMGLEE) injection 20 Units -- Subcutaneous MAR Hold Transfer Provider, Automatic --    05/19/2022 1329 EDT lactated ringers infusion -- Intravenous Anesthesia Volume Adjustment Dorie Rank, CRNA --    05/19/2022 1300 EDT lactated ringers infusion -- Intravenous Anesthesia Volume Adjustment Dorie Rank, CRNA --    05/19/2022 1242 EDT lactated ringers infusion -- Intravenous Canceled Entry Dorie Rank, CRNA --    05/19/2022 1230 EDT  lactated ringers infusion -- Intravenous New Bag/Given Dorie Rank, CRNA --    05/19/2022 1230 EDT lidocaine 2% (20 mg/mL) 5 mL syringe 40 mg Intravenous Given Dorie Rank, CRNA --    05/19/2022 1154 EDT MEDLINE mouth rinse -- Mouth Rinse See Alternative Johny Shears, RN --    05/27/2022 2200 EDT metoprolol tartrate (LOPRESSOR) tablet 12.5 mg -- Oral Automatically Held Transfer Provider, Automatic --    05/27/2022 1000 EDT metoprolol tartrate (LOPRESSOR) tablet 12.5 mg -- Oral Automatically Held Transfer Provider, Automatic --    05/26/2022 2200 EDT metoprolol tartrate (LOPRESSOR) tablet 12.5 mg -- Oral Automatically Held Transfer Provider, Automatic --    05/26/2022 1000 EDT metoprolol tartrate (LOPRESSOR) tablet 12.5 mg -- Oral Automatically Held Transfer Provider, Automatic --    05/25/2022 2200 EDT metoprolol tartrate (LOPRESSOR) tablet 12.5 mg -- Oral Automatically Held Transfer Provider, Automatic --    05/25/2022 1000 EDT metoprolol tartrate (LOPRESSOR) tablet 12.5  mg -- Gaffer, Automatic --    05/24/2022 2200 EDT metoprolol tartrate (LOPRESSOR) tablet 12.5 mg -- Gaffer, Automatic --    05/24/2022 1000 EDT metoprolol tartrate (LOPRESSOR) tablet 12.5 mg -- Gaffer, Automatic --    05/23/2022 2200 EDT metoprolol tartrate (LOPRESSOR) tablet 12.5 mg -- Gaffer, Automatic --    05/23/2022 1000 EDT metoprolol tartrate (LOPRESSOR) tablet 12.5 mg -- Gaffer, Automatic --    05/22/2022 2200 EDT metoprolol tartrate (LOPRESSOR) tablet 12.5 mg -- Gaffer, Automatic --    05/22/2022 1000 EDT metoprolol tartrate (LOPRESSOR) tablet 12.5 mg -- Gaffer, Automatic --    05/21/2022 2200 EDT metoprolol tartrate (LOPRESSOR) tablet 12.5 mg -- Health and safety inspector, Automatic --    05/21/2022 1000 EDT metoprolol tartrate (LOPRESSOR) tablet 12.5 mg -- Gaffer, Automatic --    05/20/2022 2200 EDT metoprolol tartrate (LOPRESSOR) tablet 12.5 mg -- Gaffer, Automatic --    05/20/2022 1000 EDT metoprolol tartrate (LOPRESSOR) tablet 12.5 mg -- Gaffer, Automatic --    05/19/2022 2200 EDT metoprolol tartrate (LOPRESSOR) tablet 12.5 mg -- Gaffer, Automatic --    05/19/2022 1157 EDT metoprolol tartrate (LOPRESSOR) tablet 12.5 mg 12.5 mg Oral Given Lorin Glass, RN --    05/19/2022 1121 EDT metoprolol tartrate (LOPRESSOR) tablet 12.5 mg -- Oral MAR Hold Transfer Provider, Automatic --    05/19/2022 1400 EDT phenylephrine (NEO-SYNEPHRINE) 20mg /NS 246mL premix infusion 0 mcg/min Intravenous Stopped Lavell Luster, CRNA --    05/19/2022 1344 EDT phenylephrine (NEO-SYNEPHRINE) 20mg /NS 238mL premix infusion 30 mcg/min Intravenous Rate/Dose Change Lavell Luster, CRNA --    05/19/2022 1334 EDT phenylephrine (NEO-SYNEPHRINE) 20mg /NS 252mL premix infusion 60 mcg/min Intravenous Rate/Dose Change Lavell Luster, CRNA --    05/19/2022 1331 EDT phenylephrine (NEO-SYNEPHRINE) 20mg /NS 255mL premix infusion 50 mcg/min Intravenous Rate/Dose Change Lavell Luster, CRNA --    05/19/2022 1324 EDT phenylephrine (NEO-SYNEPHRINE) 20mg /NS 250mL premix infusion 40 mcg/min Intravenous Rate/Dose Change Lavell Luster, CRNA --    05/19/2022 1308 EDT phenylephrine (NEO-SYNEPHRINE) 20mg /NS 281mL premix infusion 50 mcg/min Intravenous New Bag/Given Theodoro Grist M, CRNA --    05/19/2022 1308 EDT PHENYLephrine 80 mcg/ml in normal saline Adult IV Push Syringe (For Blood Pressure Support) 80 mcg Intravenous Given Lavell Luster, CRNA --    05/19/2022 1255 EDT PHENYLephrine 80 mcg/ml in normal saline Adult IV Push Syringe (For Blood Pressure Support)  160 mcg Intravenous Given Lavell Luster, CRNA --    05/19/2022 1249 EDT PHENYLephrine 80 mcg/ml in normal saline Adult IV Push Syringe (For Blood Pressure Support) 160 mcg Intravenous Given Lavell Luster, CRNA --    05/19/2022 1242 EDT PHENYLephrine 80 mcg/ml in normal saline Adult IV Push Syringe (For Blood Pressure Support) 160 mcg Intravenous Given Lavell Luster, CRNA --    05/19/2022 1233 EDT PHENYLephrine 80 mcg/ml in normal saline Adult IV Push Syringe (For Blood Pressure Support) 160 mcg Intravenous Given Lavell Luster, CRNA --    05/19/2022 1230 EDT propofol (DIPRIVAN) 10 mg/mL bolus/IV push 120 mg Intravenous Given Lavell Luster, CRNA --    05/19/2022 1241 EDT rocuronium bromide 10 mg/mL (PF) syringe 60 mg Intravenous Given Lavell Luster, CRNA --    05/19/2022 1345 EDT sugammadex sodium (BRIDION) injection  200 mg Intravenous Given Lavell Luster, CRNA --    05/28/2022 0800 EDT vancomycin (VANCOCIN) IVPB 1000 mg/200 mL premix -- Intravenous Automatically Held Wellsite geologist, Automatic --    05/27/2022 0800 EDT vancomycin (VANCOCIN) IVPB 1000 mg/200 mL premix -- Intravenous Automatically Charity fundraiser, Automatic --    05/26/2022 0800 EDT vancomycin (VANCOCIN) IVPB 1000 mg/200 mL premix -- Intravenous Automatically Charity fundraiser, Automatic --    05/25/2022 0800 EDT vancomycin (VANCOCIN) IVPB 1000 mg/200 mL premix -- Intravenous Automatically Charity fundraiser, Automatic --    05/24/2022 0800 EDT vancomycin (VANCOCIN) IVPB 1000 mg/200 mL premix -- Intravenous Automatically Charity fundraiser, Automatic --    05/23/2022 0800 EDT vancomycin (VANCOCIN) IVPB 1000 mg/200 mL premix -- Intravenous Automatically Charity fundraiser, Automatic --    05/22/2022 0800 EDT vancomycin (VANCOCIN) IVPB 1000 mg/200 mL premix -- Intravenous Automatically Charity fundraiser, Automatic --    05/21/2022 0800 EDT vancomycin (VANCOCIN) IVPB 1000 mg/200 mL premix -- Intravenous  Automatically Charity fundraiser, Automatic --    05/20/2022 0800 EDT vancomycin (VANCOCIN) IVPB 1000 mg/200 mL premix -- Intravenous Automatically Charity fundraiser, Automatic --    05/19/2022 1121 EDT vancomycin (VANCOCIN) IVPB 1000 mg/200 mL premix -- Intravenous MAR Hold Transfer Provider, Automatic --    05/19/2022 1246 EDT vancomycin (VANCOREADY) IVPB 1750 mg/350 mL 1,750 mg Intravenous Given Lavell Luster, CRNA --    05/19/2022 1121 EDT vancomycin (VANCOREADY) IVPB 1750 mg/350 mL -- Intravenous MAR Hold Transfer Provider, Automatic --       Mobility {Mobility:[id number]}

## 2022-05-19 NOTE — Progress Notes (Signed)
Pharmacy Antibiotic Note  [name] is a 76 y.o. female admitted on 05/18/2022 with cellulitis.  Pharmacy has been consulted for vancomycin and cefepime dosing.  Plan: Vancomycin 1750mg  x1 then 1000mg  IV Q24H. Goal AUC 400-550.  Expected AUC 500.  SCr used 0.8 (actual 0.75). Cefepime 2g IV Q8H.  Height: 5\' 4"  (162.6 cm) Weight: 83.8 kg (184 lb 11.9 oz) IBW/kg (Calculated) : 54.7  Temp (24hrs), Avg:98.7 F (37.1 C), Min:97.9 F (36.6 C), Max:99.4 F (37.4 C)  Recent Labs  Lab 05/18/22 2238 05/19/22 0218 05/19/22 0500  WBC  --  20.6*  --   CREATININE 0.75  --   --   LATICACIDVEN  --   --  2.0*    Estimated Creatinine Clearance: 63.6 mL/min (by C-G formula based on SCr of 0.75 mg/dL).    Allergies  Allergen Reactions   Morphine Other (See Comments)    'took me out of this world' I had to be resuscitated   Codeine Sulfate Nausea Only and Other (See Comments)    GI upset and pain   Demerol [Meperidine] Nausea And Vomiting   Propoxyphene Hcl Other (See Comments)    Darvocet caused sick headache     Thank you for allowing pharmacy to be a part of this patient's care.  Wynona Neat, PharmD, BCPS  05/19/2022 7:37 AM

## 2022-05-19 NOTE — Brief Op Note (Deleted)
Date of Surgery: 05/19/2022  INDICATIONS: [name] is a 76 y.o.-year-old female with a a left distal femoral supracondylar fracture. The risk and benefits or surgical intervention were discussed with her and she elected operative repair.  The risk and benefits of the procedure were discussed in detail and documented in the pre-operative evaluation.   PREOPERATIVE DIAGNOSIS: 1. Left supracondylar femur fracture with intraarticular extension  POSTOPERATIVE DIAGNOSIS: Same.  PROCEDURE: 1. Left retrograde femoral nail  SURGEON: Cynthia Deeds MD  ASSISTANT: [name], ATC  ANESTHESIA:  general  IV FLUIDS AND URINE: See anesthesia record.  ANTIBIOTICS: Cefepime  ESTIMATED BLOOD LOSS: 25 mL. Implant Name Type Inv. Item Serial No. Manufacturer Lot No. LRB No. Used Action  NAIL FEM RETRO 10.5X340 - [id number] Nail NAIL FEM RETRO 10.5X340  [employer](ORTH,TRAU,BIO,SG) [id number] Left 1 Implanted  SCREW CORT TI DBL LEAD 5X85 - [id number] Screw SCREW CORT TI DBL LEAD 5X85  [employer](ORTH,TRAU,BIO,SG) [id number] Left 1 Implanted  SCREW CORT TI DBL LEAD 5X75 - [id number] Screw SCREW CORT TI DBL LEAD 5X75  [employer](ORTH,TRAU,BIO,SG) [id number] Left 1 Implanted  SCREW CORT TI DBL LEAD 5X80 - [id number] Screw SCREW CORT TI DBL LEAD 5X80  [employer](ORTH,TRAU,BIO,SG) [id number] Left 1 Implanted  SCREW CORT TI DBL LEAD 5X30 - [id number] Screw SCREW CORT TI DBL LEAD 5X30  [employer](ORTH,TRAU,BIO,SG) [id number] Left 1 Implanted  SCREW CORT TI DBL LEAD 5X30 - [id number] Screw SCREW CORT TI DBL LEAD 5X30  [employer](ORTH,TRAU,BIO,SG) [id number] Left 1 Implanted    DRAINS: None  CULTURES: None  COMPLICATIONS: none  DESCRIPTION OF PROCEDURE:  The patient was identified in the preoperative holding area.  The correct site was marked cording universal protocol nursing.  The patient was subsequently taken back to the operating room. Appropriate antibiotics were given 1 hour prior to skin incision.  Anesthesia was induced.   The patient was subsequently transferred to the operative table.  All bony prominences were padded.  The patient was prepped and draped in the usual sterile fashion.  At this time final timeout was performed. A midline incision was made over the patella tendon centrally.  15 blade was used to incise through skin.  Electrocautery was used to achieve hemostasis.  15 blade was used to incise to the central aspect of the patella tendon.  A wire was introduced intra-articularly and the guidewire was placed at the central position of the femur and at the anterior aspect of Blumensaat's line.  At this time this was advanced centrally in the distal femur.  Opening reamer was used with protector in order to protect the intra-articular structures.  At this time a curved wire was passed up to the femur to be used as a reduction aid.  This along with manual traction allowed [place] to achieve excellent alignment in the coronal and sagittal planes.  A size 12 reamer was then used. This had some chatter.  The guidewire was used to measure the length of the nail and an appropriate sized rod was selected.  This was placed in a retrograde fashion.  AP and lateral fluoroscopy confirmed the reduction.  This confirmed that there was no worsening of the split in the distal femur.  This remained anatomic.  At this time 3 screws were then placed in the distal aspect of the nail and the distal fragment.  This was done using 15 blade to incise through skin and IT band and using a hemostat down to bone.  The drill was introduced and  the correct size screw was then placed first on power and then on hand.  This was done for all screws.  The tightening mechanism of the distal nail was used to lock all the screws.  At this time 2 proximal screws were placed in the static fashion using perfect circles technique.  15 blade was used to incise to the proximal thigh.  Hemostat was used to spread down to bone.  Drill was  introduced and then appropriate size screw was then measured and selected.  These both had excellent purchase.  Final fluoroscopy on AP and lateral views showed excellent reduction.  The wounds were thoroughly irrigated and closed in layers of 0 Vicryl 2-0 Vicryl and and staples.  All counts were correct at the end of the case.  There were no complications.  He was taken to the PACU.        POSTOPERATIVE PLAN: She will be 50% weight bearing for 2 weeks and then weight bearing as tolerated. She will be in a knee immobilizer for 2 weeks. She will work with PT while in the hospital. I will see her back in the office for a wound check in 2 weeks.  Cynthia Deeds, MD 1:51 PM

## 2022-05-19 NOTE — Discharge Instructions (Signed)
     Discharge Instructions    Attending Surgeon: Vanetta Mulders, MD Office Phone Number: [phone]   Diagnosis and Procedures:    Surgeries Performed: Left retrograde femoral nail  Discharge Plan:    Diet: Resume usual diet. Begin with light or bland foods.  Drink plenty of fluids.  Activity:  50% weight bearing left leg in your immobilizer for 2 weeks total.  GENERAL INSTRUCTIONS: 1.  Please apply ice to your wound to help with swelling and inflammation. This will improve your comfort and your overall recovery following surgery.     2. Please call Dr. Eddie Dibbles office at [phone] with questions Monday-Friday during business hours. If no one answers, please leave a message and someone should get back to the patient within 24 hours. For emergencies please call 911 or proceed to the emergency room.   3. Patient to notify surgical team if experiences any of the following: Bowel/Bladder dysfunction, uncontrolled pain, nerve/muscle weakness, incision with increased drainage or redness, nausea/vomiting and Fever greater than 101.0 F.  Be alert for signs of infection including redness, streaking, odor, fever or chills. Be alert for excessive pain or bleeding and notify your surgeon immediately.  WOUND INSTRUCTIONS:   Leave your dressing, cast, or splint in place until your post operative visit.  Keep it clean and dry.  Always keep the incision clean and dry until the staples/sutures are removed. If there is no drainage from the incision you should keep it open to air. If there is drainage from the incision you must keep it covered at all times until the drainage stops  Do not soak in a bath tub, hot tub, pool, lake or other body of water until 21 days after your surgery and your incision is completely dry and healed.  If you have removable sutures (or staples) they must be removed 10-14 days (unless otherwise instructed) from the day of your surgery.     1)  Elevate the  extremity as much as possible.  2)  Keep the dressing clean and dry.  3)  Please call us if the dressing becomes wet or dirty.  4)  If you are experiencing worsening pain or worsening swelling, please call.

## 2022-05-19 NOTE — H&P (Signed)
History and Physical    [name] [id number] DOB: [birth year] DOA: 05/18/2022  PCP: Sandrea Hughs, NP  Patient coming from: Home.  Chief Complaint: Follow-up.  HPI: [name] is a 76 y.o. female with history of TAVR, diabetes mellitus with recent hemoglobin A1c more than 14, chronic lower extremity lymphedema.  The ER after patient had a fall.  Patient states she was trying to stand up from the easy chair when she lost balance and fell.  Did not hit her head but did not lose consciousness.  ED Course: In the ER patient had extensive scans done and shows left distal femur fracture.  On-call orthopedic surgeon Dr. Sammuel Hines was consulted patient admitted for further work-up.  Likely to go for surgery today.  In the ER patient remained afebrile with tachycardia and leukocytosis with WBC count of 20,000.  On exam patient does have bilateral lower extremity edema which also shows quite erythematous legs which is warm to touch.  Blood cultures were obtained and started on empiric antibiotics.  Review of Systems: As per HPI, rest all negative.   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 Cyndia Bent and Burt Knack   Venous insufficiency    Ventral hernia     Past Surgical History:  Procedure Laterality Date   ABDOMINAL HYSTERECTOMY  2001   TAH-BSO   CHOLECYSTECTOMY  2001   COLONOSCOPY  2013   diverticulosis    ESOPHAGOGASTRODUODENOSCOPY   2013   normal    INCISIONAL HERNIA REPAIR     RIGHT/LEFT HEART CATH AND CORONARY ANGIOGRAPHY N/A 05/09/2020   Procedure: RIGHT/LEFT HEART CATH AND CORONARY ANGIOGRAPHY;  Surgeon: Jettie Booze, MD;  Location: [facility];  Service: Cardiovascular;  Laterality: N/A;   TEE WITHOUT CARDIOVERSION N/A 05/17/2020   Procedure: TRANSESOPHAGEAL ECHOCARDIOGRAM (TEE);  Surgeon: Sherren Mocha, MD;  Location: [facility];  Service: Open Heart Surgery;  Laterality: N/A;   TONSILLECTOMY     TRANSCATHETER AORTIC VALVE REPLACEMENT, TRANSFEMORAL N/A 05/17/2020   Procedure: TRANSCATHETER AORTIC VALVE REPLACEMENT, TRANSFEMORAL;  Surgeon: Sherren Mocha, MD;  Location: [facility];  Service: Open Heart Surgery;  Laterality: N/A;     reports that she has never smoked. She has never used smokeless tobacco. She reports that she does not drink alcohol and does not use drugs.  Allergies  Allergen Reactions   Morphine Other (See Comments)    'took me out of this world' I had to be resuscitated   Codeine Sulfate Nausea Only and Other (See Comments)    GI upset and pain   Demerol [Meperidine] Nausea And Vomiting   Propoxyphene Hcl Other (See Comments)    Darvocet caused sick headache    Family History  Problem Relation Age of Onset   Heart failure Mother        Died in 97s  Heart failure Father        Died in 30s   CVA Father    Ovarian cancer Sister    Diabetes Sister    Other Sister        died at birth   Other Brother        drowned   Diabetes Paternal Grandmother    Breast cancer Paternal Aunt    Colon cancer Neg Hx     Prior to Admission medications   Medication Sig Start Date End Date Taking? Authorizing Provider  acetaminophen (TYLENOL) 325 MG tablet Take 325-650 mg by mouth daily as needed for mild pain or headache.    [provider]  ASPERCREME LIDOCAINE EX Apply 1 application topically 5 (five) times daily as needed (pain).    [provider]   aspirin 81 MG tablet Take 81 mg by mouth daily.    [provider]  atorvastatin (LIPITOR) 80 MG tablet Take 1 tablet (80 mg total) by mouth daily. 03/29/22   Hoyt Koch, MD  citalopram (CELEXA) 20 MG tablet Take 1 tablet (20 mg total) by mouth daily. 10/04/21   Hoyt Koch, MD  collagenase (SANTYL) 250 UNIT/GM ointment Apply 1 application. topically 3 (three) times daily. Apply to sacrum 05/02/22   [name], [name], NP  Continuous Blood Gluc Sensor (FREESTYLE LIBRE SENSOR SYSTEM) MISC Use to monitor sugars 05/02/22   [name], [name], NP  esomeprazole (NEXIUM) 20 MG capsule Take 1 capsule (20 mg total) by mouth daily at 12 noon. 03/07/20   Hoyt Koch, MD  ezetimibe (ZETIA) 10 MG tablet TAKE 1 TABLET BY MOUTH  DAILY 02/05/22   Hoyt Koch, MD  gabapentin (NEURONTIN) 300 MG capsule TAKE 1 CAPSULE BY MOUTH 3  TIMES DAILY 04/12/22   Hoyt Koch, MD  insulin degludec (TRESIBA FLEXTOUCH) 200 UNIT/ML FlexTouch Pen Inject 44 Units into the skin at bedtime. 05/02/22   [name], [name], NP  Insulin Pen Needle 31G X 8 MM MISC Use to inject insulin four times daily E11.41 05/02/22   [name], [name], NP  metFORMIN (GLUCOPHAGE) 500 MG tablet Take 1 tablet (500 mg total) by mouth 2 (two) times daily with a meal. 10/04/21   Hoyt Koch, MD  metoprolol tartrate (LOPRESSOR) 25 MG tablet Take 0.5 tablets (12.5 mg total) by mouth 2 (two) times daily. 10/04/21   Hoyt Koch, MD  Naphazoline HCl (CLEAR EYES OP) Place 1 drop into both eyes daily as needed (dry eyes/itching).    [provider]  nystatin (MYCOSTATIN/NYSTOP) powder Apply 1 application. topically 3 (three) times daily. 04/24/22   [name], [name], NP  ONETOUCH VERIO test strip USE AS DIRECTED 03/13/22   Hoyt Koch, MD  Potassium Chloride ER 20 MEQ TBCR Take 20 mEq by mouth daily. 10/04/21 11/03/21  Hoyt Koch, MD  Semaglutide (RYBELSUS) 14 MG TABS Take 14  mg by mouth daily. 10/04/21   Hoyt Koch, MD  silver sulfADIAZINE (SILVADENE) 1 % cream Apply 1 application topically daily. 01/01/22   Hoyt Koch, MD  sodium chloride (OCEAN) 0.65 % SOLN nasal spray Place 1 spray into both nostrils 4 (four) times daily. 03/07/20   Hoyt Koch, MD  torsemide (DEMADEX) 20 MG tablet Take 1 tablet (20 mg total) by mouth daily. 10/04/21   Hoyt Koch, MD    Physical Exam: Constitutional: Moderately built and nourished. Vitals:   05/19/22 0015 05/19/22 0045 05/19/22 0343 05/19/22 0430  BP: (!) 194/172 136/88  (!) 110/56  Pulse: (!) 107 (!) 114  (!) 111  Resp: 15 19  18   Temp:   99.4 F (37.4 C)   TempSrc:   Rectal   SpO2: 97% 95%  95%  Weight:      Height:       Eyes: Anicteric no pallor. ENMT: No discharge from the ears eyes nose and mouth. Neck: No mass felt.  No neck rigidity. Respiratory: No rhonchi or crepitations. Cardiovascular: S1-S2 heard. Abdomen: Soft nontender bowel sound present. Musculoskeletal: Left leg is in brace.  Has bilateral lower extremity edema with erythema. Skin: Bilateral lower EXTR with erythema. Neurologic: Alert awake oriented time place and person.  Moves all extremities. Psychiatric: Appears normal.   Labs on Admission: I have personally reviewed following labs and imaging studies  CBC: Recent Labs  Lab 05/19/22 0218  WBC 20.6*  NEUTROABS 17.6*  HGB 13.1  HCT 39.0  MCV 84.2  PLT [id number]   Basic Metabolic Panel: Recent Labs  Lab 05/18/22 2238  NA 135  K 4.4  CL 101  CO2 21*  GLUCOSE 355*  BUN 21  CREATININE 0.75  CALCIUM 9.0   GFR: Estimated Creatinine Clearance: 63.6 mL/min (by C-G formula based on SCr of 0.75 mg/dL). Liver Function Tests: Recent Labs  Lab 05/18/22 2238  AST 26  ALT 29  ALKPHOS 168*  BILITOT 0.3  PROT 6.7  ALBUMIN 2.8*   No results for input(s): "LIPASE", "AMYLASE" in the last 168 hours. No results for input(s): "AMMONIA" in the last  168 hours. Coagulation Profile: No results for input(s): "INR", "PROTIME" in the last 168 hours. Cardiac Enzymes: No results for input(s): "CKTOTAL", "CKMB", "CKMBINDEX", "TROPONINI" in the last 168 hours. BNP (last 3 results) No results for input(s): "PROBNP" in the last 8760 hours. HbA1C: No results for input(s): "HGBA1C" in the last 72 hours. CBG: Recent Labs  Lab 05/19/22 0130  GLUCAP 315*   Lipid Profile: No results for input(s): "CHOL", "HDL", "LDLCALC", "TRIG", "CHOLHDL", "LDLDIRECT" in the last 72 hours. Thyroid Function Tests: No results for input(s): "TSH", "T4TOTAL", "FREET4", "T3FREE", "THYROIDAB" in the last 72 hours. Anemia Panel: No results for input(s): "VITAMINB12", "FOLATE", "FERRITIN", "TIBC", "IRON", "RETICCTPCT" in the last 72 hours. Urine analysis:    Component Value Date/Time   COLORURINE STRAW (A) 05/18/2022 2321   APPEARANCEUR CLEAR 05/18/2022 2321   LABSPEC 1.007 05/18/2022 2321   PHURINE 5.0 05/18/2022 2321   GLUCOSEU >=500 (A) 05/18/2022 2321   HGBUR SMALL (A) 05/18/2022 2321   BILIRUBINUR NEGATIVE 05/18/2022 2321   BILIRUBINUR Neg 05/10/2019 1029   KETONESUR NEGATIVE 05/18/2022 2321   PROTEINUR NEGATIVE 05/18/2022 2321   UROBILINOGEN 0.2 05/10/2019 1029   UROBILINOGEN 0.2 12/19/2014 1425   NITRITE NEGATIVE 05/18/2022 2321   LEUKOCYTESUR SMALL (A) 05/18/2022 2321   Sepsis Labs: @LABRCNTIP (procalcitonin:4,lacticidven:4) )No results found for this or any previous visit (from the past 240 hour(s)).   Radiological Exams on Admission: CT Knee Left Wo Contrast  Result Date: 05/19/2022 CLINICAL DATA:  Knee fracture. EXAM: CT OF THE LEFT KNEE WITHOUT CONTRAST TECHNIQUE: Multidetector CT imaging of the left knee was performed according to the standard protocol. Multiplanar CT image reconstructions were also generated. RADIATION DOSE REDUCTION: This exam was performed according to the departmental dose-optimization program which includes automated  exposure control, adjustment of the mA and/or kV according to patient size and/or use of iterative reconstruction technique. COMPARISON:  05/18/2022. FINDINGS: Bones/Joint/Cartilage There is a comminuted fracture of the supracondylar  femur with intra-articular extension and mild posterior displacement of the distal fracture fragment. The remaining bony structures are intact. There are moderate degenerative changes in the patellofemoral and medial compartments. Mild degenerative changes are noted in the lateral compartment. Enthesopathic changes are noted at the patella. There is a moderate joint effusion. Ligaments Suboptimally assessed by CT. Muscles and Tendons No intramuscular hematoma.  The visualized tendons appear intact. Soft tissues Diffuse subcutaneous edema about the knee. Vascular calcifications are noted in the soft soft tissues. IMPRESSION: 1. Comminuted fracture of the supracondylar distal femur with intra-articular extension and mild posterior displacement of the distal fracture fragment. 2. Moderate joint effusion. 3. Mild-to-moderate degenerative changes at the knee. Electronically Signed   By: Brett Fairy M.D.   On: 05/19/2022 02:08   CT CHEST ABDOMEN PELVIS W CONTRAST  Result Date: 05/19/2022 CLINICAL DATA:  Fall, hit back of head. EXAM: CT CHEST, ABDOMEN, AND PELVIS WITH CONTRAST TECHNIQUE: Multidetector CT imaging of the chest, abdomen and pelvis was performed following the standard protocol during bolus administration of intravenous contrast. RADIATION DOSE REDUCTION: This exam was performed according to the departmental dose-optimization program which includes automated exposure control, adjustment of the mA and/or kV according to patient size and/or use of iterative reconstruction technique. CONTRAST:  123mL OMNIPAQUE IOHEXOL 300 MG/ML  SOLN COMPARISON:  05/12/2020 FINDINGS: CT CHEST FINDINGS Cardiovascular: Heart is normal size. Aorta is normal caliber. Prior aortic valve repair.  Coronary artery and aortic calcifications. Mediastinum/Nodes: No mediastinal, hilar, or axillary adenopathy. Trachea and esophagus are unremarkable. Thyroid unremarkable. Lungs/Pleura: Lungs are clear. No focal airspace opacities or suspicious nodules. No effusions. No pneumothorax Musculoskeletal: No acute bony abnormality. CT ABDOMEN PELVIS FINDINGS Hepatobiliary: Prior cholecystectomy. No focal hepatic abnormality or perihepatic hematoma. Pancreas: No focal abnormality or ductal dilatation. Spleen: No splenic injury or perisplenic hematoma. Adrenals/Urinary Tract: No adrenal hemorrhage or renal injury identified. Bladder is unremarkable. Stomach/Bowel: Normal appendix. Stomach, large and small bowel grossly unremarkable. Vascular/Lymphatic: Aortic atherosclerosis. No evidence of aneurysm or adenopathy. Reproductive: Prior hysterectomy.  No adnexal masses. Other: No free fluid or free air.  Umbilical hernia containing fat. Musculoskeletal: No acute bony abnormality. Stable chronic L1 compression fracture. IMPRESSION: No acute findings or evidence of acute traumatic injury in the chest, abdomen or pelvis. Coronary artery disease, aortic atherosclerosis. Electronically Signed   By: Rolm Baptise M.D.   On: 05/19/2022 01:53   CT CERVICAL SPINE WO CONTRAST  Result Date: 05/19/2022 CLINICAL DATA:  Fall, hit EXAM: CT CERVICAL SPINE WITHOUT CONTRAST TECHNIQUE: Multidetector CT imaging of the cervical spine was performed without intravenous contrast. Multiplanar CT image reconstructions were also generated. RADIATION DOSE REDUCTION: This exam was performed according to the departmental dose-optimization program which includes automated exposure control, adjustment of the mA and/or kV according to patient size and/or use of iterative reconstruction technique. COMPARISON:  05/10/2020 FINDINGS: Alignment: Normal Skull base and vertebrae: No acute fracture. No primary bone lesion or focal pathologic process. Soft tissues  and spinal canal: No prevertebral fluid or swelling. No visible canal hematoma. Disc levels:  Diffuse degenerative disc disease and facet disease. Upper chest: No acute findings Other: None IMPRESSION: Degenerative changes.  No acute bony abnormality. Electronically Signed   By: Rolm Baptise M.D.   On: 05/19/2022 01:48   CT HEAD WO CONTRAST  Result Date: 05/19/2022 CLINICAL DATA:  Fall, hit back of head EXAM: CT HEAD WITHOUT CONTRAST TECHNIQUE: Contiguous axial images were obtained from the base of the skull through the vertex without intravenous contrast.  RADIATION DOSE REDUCTION: This exam was performed according to the departmental dose-optimization program which includes automated exposure control, adjustment of the mA and/or kV according to patient size and/or use of iterative reconstruction technique. COMPARISON:  05/10/2020 FINDINGS: Brain: No acute intracranial abnormality. Specifically, no hemorrhage, hydrocephalus, mass lesion, acute infarction, or significant intracranial injury. Vascular: No hyperdense vessel or unexpected calcification. Skull: No acute calvarial abnormality. Sinuses/Orbits: No acute findings Other: None IMPRESSION: No acute intracranial abnormality. Electronically Signed   By: Rolm Baptise M.D.   On: 05/19/2022 01:47   DG Hip Unilat With Pelvis 2-3 Views Left  Result Date: 05/18/2022 CLINICAL DATA:  Status post fall. EXAM: DG HIP (WITH OR WITHOUT PELVIS) 2-3V LEFT COMPARISON:  None Available. FINDINGS: There is no evidence of hip fracture or dislocation. Degenerative changes are seen in the form of joint space narrowing and acetabular sclerosis. IMPRESSION: No acute osseous abnormality. Electronically Signed   By: Virgina Norfolk M.D.   On: 05/18/2022 23:17   DG Knee Complete 4 Views Left  Result Date: 05/18/2022 CLINICAL DATA:  Status post fall. EXAM: LEFT KNEE - COMPLETE 4+ VIEW COMPARISON:  None Available. FINDINGS: An acute fracture deformity is seen extending through  the supracondylar region of the distal left femur. Approximately 1/2 shaft width posterior displacement of the distal fracture site is seen. There is no evidence of dislocation. A large joint effusion is seen. IMPRESSION: Acute fracture of the distal left femur with an associated large joint effusion. Electronically Signed   By: Virgina Norfolk M.D.   On: 05/18/2022 23:16   DG Chest Port 1 View  Result Date: 05/18/2022 CLINICAL DATA:  Fall EXAM: PORTABLE CHEST 1 VIEW COMPARISON:  None Available. FINDINGS: Prior aortic valve repair. Heart is upper limits normal in size. Aortic calcifications. Mediastinal contours within normal limits. No confluent opacities or effusions. No pneumothorax. No acute bony abnormality. IMPRESSION: No active disease. Electronically Signed   By: Rolm Baptise M.D.   On: 05/18/2022 23:15    EKG: Independently reviewed.  Sinus tachycardia.  Assessment/Plan Principal Problem:   Femur fracture (HCC) Active Problems:   Type 2 diabetes with complication (HCC)   Essential hypertension   S/P TAVR (transcatheter aortic valve replacement)   Leucocytosis    Left femur fracture after mechanical fall for which orthopedic surgeon Dr. Christie Nottingham has been consulted likely to go for surgery today kept NPO. Leukocytosis with SIRS picture for which blood cultures have been obtained.  Patient's lower extremity does have chronic lymphedema with erythema warm to touch we will keep patient empiric antibiotics. History of TAVR with chronic lower extremity edema.  Since patient has leukocytosis with SIRS picture blood cultures have been obtained. Diabetes mellitus type 2 with hyperglycemia recent hemoglobin A1c about a month ago was more than 14.  Patient was placed on long-acting insulin 44 units.  For now since patient is going to have surgery I have placed patient on 20 units.  With sliding scale coverage.  Change back to home dose once patient starts eating.  Closely follow metabolic  panel. Hypertension on metoprolol. Hyperlipidemia on statins presently NPO.   Since patient has fracture of the left femur with SIRS picture will need close monitoring and inpatient status.   DVT prophylaxis: SCDs for now.  Pharmacological DVT prophylaxis therapy per orthopedics. Code Status: Full code. Family Communication: Discussed with patient. Disposition Plan: May need rehab. Consults called: Orthopedics. Admission status: Inpatient.   Rise Patience MD [facility] Pager [phone].  If 7PM-7AM,  please contact night-coverage [email or web address] Password [facility]  05/19/2022, 5:38 AM

## 2022-05-19 NOTE — Progress Notes (Signed)
[name] is a 76 y.o. female with history of TAVR, diabetes mellitus with recent hemoglobin A1c more than 14, chronic lower extremity lymphedema.  She presented to the ED after a mechanical fall at home that is now known to have resulted in a left femur fracture.  She is noted to have some leukocytosis that appears to be related to lower extremity cellulitis in the setting of chronic lymphedema and therefore she has been started on vancomycin and cefepime empirically.  She is currently n.p.o. with ongoing hyperglycemia for which SSI has been ordered.  Orthopedics consulted with plans for operative intervention likely later today.  A.m. labs ordered.  Discussed with niece on phone 6/11  She has been seen and evaluated at bedside and has been admitted after midnight.  Total care time: 25 minutes.

## 2022-05-19 NOTE — Anesthesia Preprocedure Evaluation (Addendum)
Anesthesia Evaluation  Patient identified by MRN, date of birth, ID band Patient awake    Reviewed: Allergy & Precautions, NPO status , Patient's Chart, lab work & pertinent test results, reviewed documented beta blocker date and time   History of Anesthesia Complications Negative for: history of anesthetic complications  Airway Mallampati: II  TM Distance: >3 FB Neck ROM: Full    Dental  (+) Edentulous Upper, Poor Dentition, Loose, Missing, Dental Advisory Given, Chipped   Pulmonary shortness of breath (resolved with TAVR),    breath sounds clear to auscultation       Cardiovascular hypertension, Pt. on medications and Pt. on home beta blockers (-) angina+ Valvular Problems/Murmurs (s/p TAVR)  Rhythm:Regular Rate:Normal  05/2021 ECHO: EF 60-65%, mod LVH, normal LVF, Grade 2 DD, normal RVF, 23 mm Sapien prosthetic (TAVR) valve present in the aortic position. Procedure Date: 05/17/20. AV mean gradient measures 18.8 mmHg   Neuro/Psych Depression negative neurological ROS     GI/Hepatic Neg liver ROS, GERD  Medicated and Controlled,  Endo/Other  diabetes (glu 287), Insulin DependentMorbid obesityobese  Renal/GU Renal InsufficiencyRenal disease     Musculoskeletal   Abdominal   Peds  Hematology negative hematology ROS (+)   Anesthesia Other Findings   Reproductive/Obstetrics                            Anesthesia Physical Anesthesia Plan  ASA: 3  Anesthesia Plan: General   Post-op Pain Management: Tylenol PO (pre-op)*   Induction:   PONV Risk Score and Plan: 3 and Ondansetron, Dexamethasone and Treatment may vary due to age or medical condition  Airway Management Planned:   Additional Equipment: None  Intra-op Plan:   Post-operative Plan: Extubation in OR  Informed Consent: I have reviewed the patients History and Physical, chart, labs and discussed the procedure including the risks,  benefits and alternatives for the proposed anesthesia with the patient or authorized representative who has indicated his/her understanding and acceptance.     Dental advisory given  Plan Discussed with: CRNA and Surgeon  Anesthesia Plan Comments:        Anesthesia Quick Evaluation

## 2022-05-19 NOTE — Transfer of Care (Signed)
Immediate Anesthesia Transfer of Care Note  Patient: [name]  Procedure(s) Performed: INTRAMEDULLARY (IM) RETROGRADE FEMORAL NAILING (Left: Knee)  Patient Location: PACU  Anesthesia Type:General  Level of Consciousness: awake, alert  and oriented  Airway & Oxygen Therapy: Patient connected to face mask oxygen  Post-op Assessment: Post -op Vital signs reviewed and stable  Post vital signs: stable  Last Vitals:  Vitals Value Taken Time  BP 107/83 05/19/22 1415  Temp    Pulse 77 05/19/22 1418  Resp 16 05/19/22 1418  SpO2 98 % 05/19/22 1418  Vitals shown include unvalidated device data.  Last Pain:  Vitals:   05/19/22 1124  TempSrc: Oral  PainSc: 10-Worst pain ever         Complications: No notable events documented.

## 2022-05-19 NOTE — Anesthesia Procedure Notes (Addendum)
Procedure Name: Intubation Date/Time: 05/19/2022 12:32 PM  Performed by: Lavell Luster, CRNAPre-anesthesia Checklist: Patient identified, Emergency Drugs available, Suction available, Patient being monitored and Timeout performed Patient Re-evaluated:Patient Re-evaluated prior to induction Oxygen Delivery Method: Circle system utilized Preoxygenation: Pre-oxygenation with 100% oxygen Induction Type: IV induction Ventilation: Mask ventilation without difficulty Laryngoscope Size: Mac and 3 Grade View: Grade I Tube type: Oral Tube size: 7.0 mm Number of attempts: 1 Airway Equipment and Method: Stylet Placement Confirmation: ETT inserted through vocal cords under direct vision, positive ETCO2 and breath sounds checked- equal and bilateral Secured at: 21 cm Tube secured with: Tape Dental Injury: Teeth and Oropharynx as per pre-operative assessment

## 2022-05-19 NOTE — Progress Notes (Signed)
Orthopedic Tech Progress Note Patient Details:  [name] [birth year] [id number]  Ortho Devices Type of Ortho Device: Knee Immobilizer Ortho Device/Splint Location: lle Ortho Device/Splint Interventions: Ordered, Application, Adjustment   Post Interventions Patient Tolerated: Well Instructions Provided: Care of device, Adjustment of device  [name] Post 05/19/2022, 2:18 AM
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
## 2022-05-19 NOTE — Op Note (Signed)
Date of Surgery: 05/19/2022  INDICATIONS: [name] is a 76 y.o.-year-old female with a a left distal femoral supracondylar fracture. The risk and benefits or surgical intervention were discussed with her and she elected operative repair.  The risk and benefits of the procedure were discussed in detail and documented in the pre-operative evaluation.   PREOPERATIVE DIAGNOSIS: 1. Left supracondylar femur fracture with intraarticular extension  POSTOPERATIVE DIAGNOSIS: Same.  PROCEDURE: 1. Left retrograde femoral nail  SURGEON: Benancio Deeds MD  ASSISTANT: Kerby Less, ATC  ANESTHESIA:  general  IV FLUIDS AND URINE: See anesthesia record.  ANTIBIOTICS: Cefepime  ESTIMATED BLOOD LOSS: 25 mL. Implant Name Type Inv. Item Serial No. Manufacturer Lot No. LRB No. Used Action  NAIL FEM RETRO 10.5X340 - [id number] Nail NAIL FEM RETRO 10.5X340  [employer](ORTH,TRAU,BIO,SG) [id number] Left 1 Implanted  SCREW CORT TI DBL LEAD 5X85 - [id number] Screw SCREW CORT TI DBL LEAD 5X85  [employer](ORTH,TRAU,BIO,SG) [id number] Left 1 Implanted  SCREW CORT TI DBL LEAD 5X75 - [id number] Screw SCREW CORT TI DBL LEAD 5X75  [employer](ORTH,TRAU,BIO,SG) [id number] Left 1 Implanted  SCREW CORT TI DBL LEAD 5X80 - [id number] Screw SCREW CORT TI DBL LEAD 5X80  [employer](ORTH,TRAU,BIO,SG) [id number] Left 1 Implanted  SCREW CORT TI DBL LEAD 5X30 - [id number] Screw SCREW CORT TI DBL LEAD 5X30  [employer](ORTH,TRAU,BIO,SG) [id number] Left 1 Implanted  SCREW CORT TI DBL LEAD 5X30 - [id number] Screw SCREW CORT TI DBL LEAD 5X30  [employer](ORTH,TRAU,BIO,SG) [id number] Left 1 Implanted    DRAINS: None  CULTURES: None  COMPLICATIONS: none  DESCRIPTION OF PROCEDURE:  The patient was identified in the preoperative holding area.  The correct site was marked cording universal protocol nursing.  The patient was subsequently taken back to the operating room. Appropriate antibiotics were given 1 hour prior to skin incision.  Anesthesia was induced.   The patient was subsequently transferred to the operative table.  All bony prominences were padded.  The patient was prepped and draped in the usual sterile fashion.  At this time final timeout was performed. A midline incision was made over the patella tendon centrally.  15 blade was used to incise through skin.  Electrocautery was used to achieve hemostasis.  15 blade was used to incise to the central aspect of the patella tendon.  A wire was introduced intra-articularly and the guidewire was placed at the central position of the femur and at the anterior aspect of Blumensaat's line.  At this time this was advanced centrally in the distal femur.  Opening reamer was used with protector in order to protect the intra-articular structures.  At this time a curved wire was passed up to the femur to be used as a reduction aid.  This along with manual traction allowed [place] to achieve excellent alignment in the coronal and sagittal planes.  A size 12 reamer was then used. This had some chatter.  The guidewire was used to measure the length of the nail and an appropriate sized rod was selected.  This was placed in a retrograde fashion.  AP and lateral fluoroscopy confirmed the reduction.  This confirmed that there was no worsening of the split in the distal femur.  This remained anatomic.  At this time 3 screws were then placed in the distal aspect of the nail and the distal fragment.  This was done using 15 blade to incise through skin and IT band and using a hemostat down to bone.  The drill was introduced and  the correct size screw was then placed first on power and then on hand.  This was done for all screws.  The tightening mechanism of the distal nail was used to lock all the screws.  At this time 2 proximal screws were placed in the static fashion using perfect circles technique.  15 blade was used to incise to the proximal thigh.  Hemostat was used to spread down to bone.  Drill was  introduced and then appropriate size screw was then measured and selected.  These both had excellent purchase.  Final fluoroscopy on AP and lateral views showed excellent reduction.  The wounds were thoroughly irrigated and closed in layers of 0 Vicryl 2-0 Vicryl and and staples.  All counts were correct at the end of the case.  There were no complications.  He was taken to the PACU.        POSTOPERATIVE PLAN: She will be 50% weight bearing for 2 weeks and then weight bearing as tolerated. She will be in a knee immobilizer for 2 weeks. She will work with PT while in the hospital. I will see her back in the office for a wound check in 2 weeks.  Benancio Deeds, MD 1:51 PM

## 2022-05-19 NOTE — Anesthesia Postprocedure Evaluation (Signed)
Anesthesia Post Note  Patient: [name]  Procedure(s) Performed: INTRAMEDULLARY (IM) RETROGRADE FEMORAL NAILING (Left: Knee)     Patient location during evaluation: PACU Anesthesia Type: General Level of consciousness: awake and alert, patient cooperative and oriented Pain management: pain level controlled Vital Signs Assessment: post-procedure vital signs reviewed and stable Respiratory status: spontaneous breathing, nonlabored ventilation, respiratory function stable and patient connected to nasal cannula oxygen Cardiovascular status: blood pressure returned to baseline and stable Postop Assessment: no apparent nausea or vomiting Anesthetic complications: no   No notable events documented.  Last Vitals:  Vitals:   05/19/22 1455 05/19/22 1510  BP:  (!) 110/40  Pulse: 94 95  Resp: 16 18  Temp:  36.8 C  SpO2: 98% 96%    Last Pain:  Vitals:   05/19/22 1510  TempSrc:   PainSc: 2                  Demaree Liberto,E. Toye Rouillard

## 2022-05-20 ENCOUNTER — Encounter (HOSPITAL_COMMUNITY): Payer: Self-pay | Admitting: Orthopaedic Surgery

## 2022-05-20 DIAGNOSIS — Z952 Presence of prosthetic heart valve: Secondary | ICD-10-CM | POA: Diagnosis not present

## 2022-05-20 DIAGNOSIS — I10 Essential (primary) hypertension: Secondary | ICD-10-CM | POA: Diagnosis not present

## 2022-05-20 DIAGNOSIS — S7292XA Unspecified fracture of left femur, initial encounter for closed fracture: Secondary | ICD-10-CM | POA: Diagnosis not present

## 2022-05-20 DIAGNOSIS — D72829 Elevated white blood cell count, unspecified: Secondary | ICD-10-CM | POA: Diagnosis not present

## 2022-05-20 LAB — CBC
HCT: 29.7 % — ABNORMAL LOW (ref 36.0–46.0)
Hemoglobin: 9.4 g/dL — ABNORMAL LOW (ref 12.0–15.0)
MCH: 27.2 pg (ref 26.0–34.0)
MCHC: 31.6 g/dL (ref 30.0–36.0)
MCV: 86.1 fL (ref 80.0–100.0)
Platelets: 138 10*3/uL — ABNORMAL LOW (ref 150–400)
RBC: 3.45 MIL/uL — ABNORMAL LOW (ref 3.87–5.11)
RDW: 13.1 % (ref 11.5–15.5)
WBC: 10.3 10*3/uL (ref 4.0–10.5)
nRBC: 0 % (ref 0.0–0.2)

## 2022-05-20 LAB — GLUCOSE, CAPILLARY
Glucose-Capillary: 163 mg/dL — ABNORMAL HIGH (ref 70–99)
Glucose-Capillary: 163 mg/dL — ABNORMAL HIGH (ref 70–99)
Glucose-Capillary: 232 mg/dL — ABNORMAL HIGH (ref 70–99)
Glucose-Capillary: 240 mg/dL — ABNORMAL HIGH (ref 70–99)
Glucose-Capillary: 288 mg/dL — ABNORMAL HIGH (ref 70–99)
Glucose-Capillary: 311 mg/dL — ABNORMAL HIGH (ref 70–99)

## 2022-05-20 LAB — BASIC METABOLIC PANEL
Anion gap: 4 — ABNORMAL LOW (ref 5–15)
BUN: 23 mg/dL (ref 8–23)
CO2: 28 mmol/L (ref 22–32)
Calcium: 8.2 mg/dL — ABNORMAL LOW (ref 8.9–10.3)
Chloride: 103 mmol/L (ref 98–111)
Creatinine, Ser: 0.85 mg/dL (ref 0.44–1.00)
GFR, Estimated: >60 mL/min (ref >60)
Glucose, Bld: 279 mg/dL — ABNORMAL HIGH (ref 70–99)
Potassium: 4 mmol/L (ref 3.5–5.1)
Sodium: 135 mmol/L (ref 135–145)

## 2022-05-20 LAB — MAGNESIUM: Magnesium: 1.6 mg/dL — ABNORMAL LOW (ref 1.7–2.4)

## 2022-05-20 MED ORDER — CHLORHEXIDINE GLUCONATE CLOTH 2 % EX PADS
6.0000 | MEDICATED_PAD | Freq: Every day | CUTANEOUS | Status: DC
  Administered 2022-05-20 – 2022-05-22 (×3): 6 via TOPICAL

## 2022-05-20 MED ORDER — EZETIMIBE 10 MG PO TABS
10.0000 mg | ORAL_TABLET | Freq: Every day | ORAL | Status: DC
  Administered 2022-05-20 – 2022-05-22 (×3): 10 mg via ORAL
  Filled 2022-05-20 (×3): qty 1

## 2022-05-20 MED ORDER — MUPIROCIN 2 % EX OINT
1.0000 "application " | TOPICAL_OINTMENT | Freq: Two times a day (BID) | CUTANEOUS | Status: DC
  Administered 2022-05-20 – 2022-05-22 (×5): 1 via NASAL
  Filled 2022-05-20: qty 22

## 2022-05-20 MED ORDER — INSULIN GLARGINE-YFGN 100 UNIT/ML ~~LOC~~ SOLN
35.0000 [IU] | Freq: Every day | SUBCUTANEOUS | Status: DC
  Administered 2022-05-20 – 2022-05-22 (×3): 35 [IU] via SUBCUTANEOUS
  Filled 2022-05-20 (×3): qty 0.35

## 2022-05-20 MED ORDER — PANTOPRAZOLE SODIUM 40 MG PO TBEC
40.0000 mg | DELAYED_RELEASE_TABLET | Freq: Every day | ORAL | Status: DC
  Administered 2022-05-20 – 2022-05-22 (×3): 40 mg via ORAL
  Filled 2022-05-20 (×3): qty 1

## 2022-05-20 MED ORDER — INSULIN ASPART 100 UNIT/ML IJ SOLN
0.0000 [IU] | Freq: Every day | INTRAMUSCULAR | Status: DC
  Administered 2022-05-20 – 2022-05-21 (×2): 2 [IU] via SUBCUTANEOUS

## 2022-05-20 MED ORDER — MAGNESIUM SULFATE 2 GM/50ML IV SOLN
2.0000 g | Freq: Once | INTRAVENOUS | Status: AC
Start: 2022-05-20 — End: 2022-05-20
  Administered 2022-05-20: 2 g via INTRAVENOUS
  Filled 2022-05-20: qty 50

## 2022-05-20 MED ORDER — ATORVASTATIN CALCIUM 40 MG PO TABS
40.0000 mg | ORAL_TABLET | Freq: Every day | ORAL | Status: DC
  Administered 2022-05-20 – 2022-05-22 (×3): 40 mg via ORAL
  Filled 2022-05-20 (×3): qty 1

## 2022-05-20 MED ORDER — INSULIN ASPART 100 UNIT/ML IJ SOLN
0.0000 [IU] | Freq: Three times a day (TID) | INTRAMUSCULAR | Status: DC
  Administered 2022-05-20: 3 [IU] via SUBCUTANEOUS
  Administered 2022-05-20: 8 [IU] via SUBCUTANEOUS
  Administered 2022-05-21: 3 [IU] via SUBCUTANEOUS
  Administered 2022-05-21: 8 [IU] via SUBCUTANEOUS
  Administered 2022-05-22: 3 [IU] via SUBCUTANEOUS

## 2022-05-20 MED ORDER — CITALOPRAM HYDROBROMIDE 20 MG PO TABS
20.0000 mg | ORAL_TABLET | Freq: Every day | ORAL | Status: DC
  Administered 2022-05-20 – 2022-05-22 (×3): 20 mg via ORAL
  Filled 2022-05-20 (×3): qty 1

## 2022-05-20 MED ORDER — GABAPENTIN 300 MG PO CAPS
300.0000 mg | ORAL_CAPSULE | Freq: Three times a day (TID) | ORAL | Status: DC
  Administered 2022-05-20 – 2022-05-22 (×7): 300 mg via ORAL
  Filled 2022-05-20 (×7): qty 1

## 2022-05-20 MED ORDER — CEFAZOLIN SODIUM-DEXTROSE 2-4 GM/100ML-% IV SOLN
2.0000 g | Freq: Three times a day (TID) | INTRAVENOUS | Status: DC
  Administered 2022-05-20 – 2022-05-22 (×6): 2 g via INTRAVENOUS
  Filled 2022-05-20 (×6): qty 100

## 2022-05-20 MED ORDER — OXYCODONE HCL 5 MG PO TABS: 5.0000 mg | ORAL_TABLET | Freq: Four times a day (QID) | ORAL | Status: DC | PRN

## 2022-05-20 NOTE — Progress Notes (Signed)
Mobility Specialist Progress Note   05/20/22 1418  Mobility  Activity Transferred from chair to bed  Level of Assistance +2 (takes two people)  Assistive Device MaxiMove  RLE Weight Bearing PWB  Activity Response Tolerated fair  $Mobility charge 1 Mobility   Received pt in chair w/ minor complaints of BLE pain. Pt expressing levels of discomfort throughout transfer but transferred w/o fault. Left supine in bed w/ call bell in reach and needs met.    Holland Falling Mobility Specialist Phone Number [phone]

## 2022-05-20 NOTE — Progress Notes (Signed)
Inpatient Diabetes Program Recommendations  AACE/ADA: New Consensus Statement on Inpatient Glycemic Control   Target Ranges:  Prepandial:   less than 140 mg/dL      Peak postprandial:   less than 180 mg/dL (1-2 hours)      Critically ill patients:  140 - 180 mg/dL    Latest Reference Range & Units 05/20/22 00:31 05/20/22 03:52 05/20/22 07:34 05/20/22 11:13  Glucose-Capillary 70 - 99 mg/dL 852 (H) 778 (H) 242 (H) 163 (H)    Latest Reference Range & Units 05/19/22 07:51 05/19/22 11:31 05/19/22 14:12 05/19/22 16:13 05/19/22 21:27  Glucose-Capillary 70 - 99 mg/dL 353 (H) 614 (H) 431 (H) 171 (H) 358 (H)    Latest Reference Range & Units 11/19/21 13:45 04/24/22 12:31  Hemoglobin A1C <5.7 % of total Hgb 9.5 ! >14.0 (H)   Review of Glycemic Control  Diabetes history: DM2 Outpatient Diabetes medications: Tresiba daily, Rybelsus 14 mg daily, Metformin 500 mg BID Current orders for Inpatient glycemic control: Semglee 35 units daily, Novolog 0-9 units Q4H  Inpatient Diabetes Program Recommendations:    HbgA1C:  A1C over 14% on 04/24/22 indicating an average glucose of over 355 mg/dl.  Patient reports she forgets to take medications at times and would like to have someone help her at home.  NOTE: Patient admitted 05/19/22 after fall with femur fx, lower extremity swelling. Patient had surgery on 05/19/22 and received Decadron 5 mg x1 on 05/19/22 which is contributing to hyperglycemia.  Spoke with patient at bedside regarding DM control. Patient sitting up in chair. When asked about DM medications she is taking, patient states that she can not remember the names of the medications. When asked if she was taking [place] insulin, she stated "that sounds right" and when asked about dose she said she could not remember the dose, she has a paper at home with the dose. When asked about Metformin and Rybelsus, patient states she is taking them. In talking with patient she states she is "forgetful" and she does  forget to take her medications sometimes. Patient reports that she does have any children close by and that she has a niece that checks in on her. Patient states that her niece has several children and can't really help her out at home. Patient uses a [employer] CGM for glucose monitoring and states her glucose is always high (200-400's mg/dl).  Discussed A1C of over 14% on 04/24/22 and that it indicates an average glucose of over 355 mg/dl. Explained that glucose needs to be improved especially following surgery. Patient states she is unsure if she will need to go to rehab, depends on how she does with therapy.  Asked patient if she could start keeping a log of glucose readings daily and write down when she takes her insulin and oral DM medications. Patient states she could try to do that, but again states she is forgetful and she often falls asleep and unsure if she took her medications or not. Patient state she will try to do better but she wishes she had someone to help her at home.  Patient states she has no questions at this time.  Thanks, Orlando Penner, RN, MSN, CDCES Diabetes Coordinator Inpatient Diabetes Program [phone] (Team Pager from 8am to 5pm)

## 2022-05-20 NOTE — Evaluation (Signed)
Physical Therapy Evaluation Patient Details Name: [name] MRN: [id number] DOB: March 27, 1946 Today's Date: 05/20/2022  History of Present Illness  [name] is a 76 y.o. female who presented to [facility] ED on 6/10 via EMS complaining of left hip pain following a fall.  Patient reports she was trying to stand up from the easy chair when she lost her balance and fell.  Undersent IM nail left femur fx on 6/11.  PMH:  aortic stenosis s/p TAVR, type 2 diabetes mellitus, HLD, CKD stage IIIa, HTN, GERD, depression, chronic diastolic congestive heart failure, peripheral neuropathy, chronic lower extremity lymphedema  Clinical Impression  Pt admitted with above diagnosis. Pt was able to sit EOB with mod to max assist.  Posterior lean heavy at times needing a lot of assist to just sit up.  Pt needing total asssist of 2 with use of pad  to scoot to a drop arm recliner.  Will need sNF at d/c.   Pt currently with functional limitations due to the deficits listed below (see PT Problem List). Pt will benefit from skilled PT to increase their independence and safety with mobility to allow discharge to the venue listed below.          Recommendations for follow up therapy are one component of a multi-disciplinary discharge planning process, led by the attending physician.  Recommendations may be updated based on patient status, additional functional criteria and insurance authorization.  Follow Up Recommendations Skilled nursing-short term rehab (<3 hours/day)    Assistance Recommended at Discharge Frequent or constant Supervision/Assistance  Patient can return home with the following  Two people to help with walking and/or transfers;Two people to help with bathing/dressing/bathroom;Assistance with cooking/housework;Assist for transportation;Help with stairs or ramp for entrance    Equipment Recommendations None recommended by PT  Recommendations for Other Services       Functional Status  Assessment Patient has had a recent decline in their functional status and demonstrates the ability to make significant improvements in function in a reasonable and predictable amount of time.     Precautions / Restrictions Precautions Precautions: Fall Required Braces or Orthoses: Knee Immobilizer - Left Knee Immobilizer - Left: Other (comment) (Pt is not to remove for 2 weeks per MD) Restrictions RLE Weight Bearing: Partial weight bearing RLE Partial Weight Bearing Percentage or Pounds: 50      Mobility  Bed Mobility Overal bed mobility: Needs Assistance Bed Mobility: Supine to Sit     Supine to sit: Total assist, +2 for physical assistance     General bed mobility comments: Pt had incr difficulty assisting 2 therapists with pt initiiating movement to EOB less than 20 %.  Needed assist with LEs and for elevation of trunk.    Transfers Overall transfer level: Needs assistance Equipment used: Rolling walker (2 wheels) Transfers: Sit to/from Stand, Bed to chair/wheelchair/BSC Sit to Stand: Total assist, +2 physical assistance          Lateral/Scoot Transfers: Total assist, +2 physical assistance, From elevated surface General transfer comment: Pt could not stand with +2 assist therefore scooted pt to drop arm recliner with +2 total assist with pt giving little effort. Used pad to assist.    Ambulation/Gait                  Stairs            Wheelchair Mobility    Modified Rankin (Stroke Patients Only)       Balance Overall balance assessment:  Needs assistance Sitting-balance support: Bilateral upper extremity supported, Single extremity supported, Feet supported Sitting balance-Leahy Scale: Poor Sitting balance - Comments: Pt needed constant support to sit EOB with pt leaning posteriorly and to her right needing assist at all times.  mod to max assist of 2 to sit EOB. Postural control: Posterior lean, Right lateral lean Standing balance support:  Bilateral upper extremity supported, During functional activity Standing balance-Leahy Scale: Zero Standing balance comment: unable to stand with +2 assist                             Pertinent Vitals/Pain Pain Assessment Pain Assessment: 0-10 Faces Pain Scale: Hurts even more Pain Location: buttocks Pain Descriptors / Indicators: Aching, Discomfort, Grimacing, Guarding Pain Intervention(s): Limited activity within patient's tolerance, Monitored during session, Repositioned    Home Living Family/patient expects to be discharged to:: Private residence Living Arrangements: Alone Available Help at Discharge: Family;Friend(s);Available PRN/intermittently Type of Home: House Home Access: Ramped entrance       Home Layout: One level Home Equipment: Rollator (4 wheels);Cane - single point;Grab bars - toilet;Wheelchair - manual;Shower seat;Grab bars - tub/shower;Hand held shower head      Prior Function Prior Level of Function : Independent/Modified Independent;History of Falls (last six months) (1 other fall)               ADLs Comments: Vladimir Faster delivers groceries     Hand Dominance   Dominant Hand: Right    Extremity/Trunk Assessment   Upper Extremity Assessment Upper Extremity Assessment: Defer to OT evaluation    Lower Extremity Assessment Lower Extremity Assessment: LLE deficits/detail LLE: Unable to fully assess due to pain       Communication   Communication: No difficulties  Cognition Arousal/Alertness: Awake/alert Behavior During Therapy: WFL for tasks assessed/performed Overall Cognitive Status: Impaired/Different from baseline Area of Impairment: Following commands, Safety/judgement, Awareness, Problem solving, Orientation, Memory                     Memory: Decreased short-term memory Following Commands: Follows one step commands inconsistently, Follows one step commands with increased time Safety/Judgement: Decreased awareness  of safety, Decreased awareness of deficits   Problem Solving: Decreased initiation, Difficulty sequencing, Requires verbal cues          General Comments General comments (skin integrity, edema, etc.): Pts O2 not in place on arrivalwith sats 85%, therefore replaced O2 at 2L with sats 94%.    Exercises General Exercises - Lower Extremity Ankle Circles/Pumps: AROM, Both, 10 reps, Supine Long Arc Quad: Right, 10 reps, AAROM, AROM, Seated   Assessment/Plan    PT Assessment Patient needs continued PT services  PT Problem List Decreased activity tolerance;Decreased balance;Decreased mobility;Decreased strength;Decreased range of motion;Decreased cognition;Decreased knowledge of use of DME;Decreased safety awareness;Decreased knowledge of precautions;Pain;Obesity;Decreased skin integrity       PT Treatment Interventions DME instruction;Gait training;Functional mobility training;Therapeutic activities;Therapeutic exercise;Balance training;Patient/family education;Wheelchair mobility training    PT Goals (Current goals can be found in the Care Plan section)  Acute Rehab PT Goals Patient Stated Goal: to get better with therapy PT Goal Formulation: With patient Time For Goal Achievement: 06/03/22 Potential to Achieve Goals: Fair    Frequency Min 3X/week     Co-evaluation               AM-PAC PT "6 Clicks" Mobility  Outcome Measure Help needed turning from your back to your side while in a flat  bed without using bedrails?: Total Help needed moving from lying on your back to sitting on the side of a flat bed without using bedrails?: Total Help needed moving to and from a bed to a chair (including a wheelchair)?: Total Help needed standing up from a chair using your arms (e.g., wheelchair or bedside chair)?: Total Help needed to walk in hospital room?: Total Help needed climbing 3-5 steps with a railing? : Total 6 Click Score: 6    End of Session Equipment Utilized During  Treatment: Gait belt;Oxygen;Left knee immobilizer Activity Tolerance: Patient limited by fatigue;Patient limited by pain Patient left: in chair;with call bell/phone within reach;with chair alarm set Nurse Communication: Mobility status;Need for lift equipment (Maximove to get pt back to bed) PT Visit Diagnosis: Muscle weakness (generalized) (M62.81);Pain Pain - Right/Left: Left Pain - part of body: Leg    Time: 1121-1207 PT Time Calculation (min) (ACUTE ONLY): 46 min   Charges:   PT Evaluation $PT Eval Moderate Complexity: 1 Mod PT Treatments $Therapeutic Exercise: 8-22 mins $Therapeutic Activity: 8-22 mins        [facility] M,PT Acute Rehab Services [phone]   Alvira Philips 05/20/2022, 3:41 PM

## 2022-05-20 NOTE — Plan of Care (Signed)

## 2022-05-20 NOTE — Plan of Care (Signed)

## 2022-05-20 NOTE — Progress Notes (Addendum)
PROGRESS NOTE    Somone [name]  [id number] DOB: 03/29/1946 DOA: 05/18/2022 PCP: Sandrea Hughs, NP    Brief Narrative:   [name] is a 76 y.o. female with past medical history significant for aortic stenosis s/p TAVR, type 2 diabetes mellitus, HLD, CKD stage IIIa, HTN, GERD, depression, chronic diastolic congestive heart failure, peripheral neuropathy, chronic lower extremity lymphedema who presented to [facility] ED on 6/10 via EMS complaining of left hip pain following a fall.  Patient reports she was trying to stand up from the easy chair when she lost her balance and fell.  Denies striking her head and no loss of consciousness.  In the ED, temperature 97.9 F, HR 109, RR 15, BP 148/91, SPO2 97% on room air.  WBC 20.6.  Sodium 135, potassium 4.4, chloride 101, CO2 21, glucose 355, BUN 21, creatinine 0.75, anion gap 13, AST 26, ALT 29, total bilirubin 0.3.  Lactic acid 1.2.  Urinalysis with small leukocytes, negative nitrite, no bacteria, 0-5 WBCs.  Imaging notable for left distal femur fracture.  Orthopedics was consulted.  Hospitalist service consulted for further evaluation and management of left hip fracture.  Assessment & Plan:   Left distal femoral supracondylar fracture Patient presenting to the ED via EMS following mechanical fall without loss of consciousness with immediate left hip pain.  Imaging notable for left distal femur fracture.  Orthopedics was consulted and patient underwent left femoral retrograde IM nail by Dr. Sammuel Hines on 05/19/2022. --50% weightbearing with knee immobilizer LLE x2 weeks --PT/OT evaluation, anticipate need of SNF placement --Lovenox for DVT prophylaxis --Tylenol 650 g p.o. every 6 hours as needed mild pain  Postoperative blood loss anemia, expected --Hgb  --CBC in a.m.  Lower extremity cellulitis With underlying lymphedema, venous stasis.  Lower extremities notable for significant erythema with a WBC count of 20.6 on admission.   Initially started on vancomycin/cefepime, now de-escalated to cefazolin with MRSA PCR negative. --WBC 20.6>10.3 --Continue cefazolin, anticipate 7-day total antibiotic course --CBC daily  Hypomagnesemia Magnesium 1.6, will replete. --Repeat magnesium level in a.m.  Type 2 diabetes mellitus with hyperglycemia Hemoglobin A1c greater than 14.0 on 04/24/2022, poorly controlled.  Home regimen includes metformin 500 mg p.o. twice daily, Tresiba 44 units subcutaneously nightly, and semaglutide 14 mg p.o. daily. --Diabetic educator consult --Increase Semglee to 35 units [place] daily --moderate SSI for coverage --CBGs qAC/HS  Essential hypertension Chronic diastolic congestive heart failure, compensated TTE 06/07/2021 with LVEF 60-65%, noted 23 mm SAPIEN prosthetic TAVR valve in the aortic position, grade 2 diastolic dysfunction, LA mildly dilated, IVC normal in size.  Home regimen includes metoprolol tartrate 12.5 mg p.o. twice daily, torsemide 20 mg p.o. daily. --Continue metoprolol tartrate 12.5 mg p.o. twice daily --Hold torsemide for now --Strict I's and O's and daily weights  Hyperlipidemia --Atorvastatin 80 mg p.o. daily --Zetia  Depression/anxiety: Celexa 20 mg p.o. daily  GERD: On Nexium 20 mg p.o. daily at home. --Substitute Protonix while hospitalized  CKD stage IIIa --Creatinine 0.85, stable --Avoid nephrotoxins, renally dose all medications --Repeat BMP in a.m.  Peripheral neuropathy --Gabapentin 300 mg p.o. 3 times daily  DVT prophylaxis: enoxaparin (LOVENOX) injection 40 mg Start: 05/20/22 1000 SCDs Start: 05/19/22 0537    Code Status: Full Code Family Communication: No family present at bedside this morning  Disposition Plan:  Level of care: Telemetry Medical Status is: Inpatient Remains inpatient appropriate because: Awaiting PT/OT evaluation, lives alone and anticipate need for SNF placement.    Consultants:  Orthopedics, Dr.  Bokshan  Procedures:  Left  retrograde femoral nail, Dr. Sammuel Hines, 6/11  Antimicrobials:  Vancomycin 6/11 - 6/12 Cefepime 6/11 - 6/12 Cefazolin 6/11>>   Subjective: Patient seen examined bedside, resting comfortably.  Mild pain to left hip.  No other specific questions or concerns at this time.  Awaiting PT/OT evaluation.  Patient states she lives alone, discussed likely need of SNF placement.  Denies headache, no dizziness, no chest pain, no palpitations, no shortness of breath, no abdominal pain.  No acute events overnight per nurse staff.  Objective: Vitals:   05/19/22 1603 05/19/22 2033 05/20/22 0300 05/20/22 0741  BP: (!) 107/57 (!) 113/57 133/60 (!) 122/56  Pulse: 93 91 79 67  Resp: 17 20 18 19   Temp: 98.3 F (36.8 C) 99.1 F (37.3 C) 98.8 F (37.1 C) 98 F (36.7 C)  TempSrc: Oral Oral Oral Oral  SpO2: 96% 98% 97% 96%  Weight:      Height:        Intake/Output Summary (Last 24 hours) at 05/20/2022 1115 Last data filed at 05/20/2022 0407 Gross per 24 hour  Intake 1580 ml  Output 901 ml  Net 679 ml   Filed Weights   05/18/22 2246  Weight: 83.8 kg    Examination:  Physical Exam: GEN: NAD, alert and oriented x 3, elderly/chronically ill appearance HEENT: NCAT, PERRL, EOMI, sclera clear, MMM PULM: CTAB w/o wheezes/crackles, normal respiratory effort, on room air CV: RRR w/o M/G/R GI: abd soft, NTND, NABS, no R/G/M MSK: Bilateral lower extremity edema to lower thighs with surrounding erythema bilateral shins, moves all extremities independently, left knee immobilizer in place NEURO: CN II-XII intact, no focal deficits, sensation to light touch intact PSYCH: normal mood/affect Integumentary: Chronic skin changes bilateral lower extremities with erythema, otherwise no other concerning wounds///lesions    Data Reviewed: I have personally reviewed following labs and imaging studies  CBC: Recent Labs  Lab 05/19/22 0218 05/20/22 0149  WBC 20.6* 10.3  NEUTROABS 17.6*  --   HGB 13.1 9.4*   HCT 39.0 29.7*  MCV 84.2 86.1  PLT 202 0000000*   Basic Metabolic Panel: Recent Labs  Lab 05/18/22 2238 05/20/22 0149  NA 135 135  K 4.4 4.0  CL 101 103  CO2 21* 28  GLUCOSE 355* 279*  BUN 21 23  CREATININE 0.75 0.85  CALCIUM 9.0 8.2*  MG  --  1.6*   GFR: Estimated Creatinine Clearance: 59.9 mL/min (by C-G formula based on SCr of 0.85 mg/dL). Liver Function Tests: Recent Labs  Lab 05/18/22 2238  AST 26  ALT 29  ALKPHOS 168*  BILITOT 0.3  PROT 6.7  ALBUMIN 2.8*   No results for input(s): "LIPASE", "AMYLASE" in the last 168 hours. No results for input(s): "AMMONIA" in the last 168 hours. Coagulation Profile: No results for input(s): "INR", "PROTIME" in the last 168 hours. Cardiac Enzymes: No results for input(s): "CKTOTAL", "CKMB", "CKMBINDEX", "TROPONINI" in the last 168 hours. BNP (last 3 results) No results for input(s): "PROBNP" in the last 8760 hours. HbA1C: No results for input(s): "HGBA1C" in the last 72 hours. CBG: Recent Labs  Lab 05/19/22 2127 05/20/22 0031 05/20/22 0352 05/20/22 0734 05/20/22 1113  GLUCAP 358* 311* 232* 163* 163*   Lipid Profile: No results for input(s): "CHOL", "HDL", "LDLCALC", "TRIG", "CHOLHDL", "LDLDIRECT" in the last 72 hours. Thyroid Function Tests: No results for input(s): "TSH", "T4TOTAL", "FREET4", "T3FREE", "THYROIDAB" in the last 72 hours. Anemia Panel: No results for input(s): "VITAMINB12", "FOLATE", "FERRITIN", "TIBC", "IRON", "RETICCTPCT"  in the last 72 hours. Sepsis Labs: Recent Labs  Lab 05/19/22 0500 05/19/22 1620  LATICACIDVEN 2.0* 1.2    Recent Results (from the past 240 hour(s))  Blood culture (routine x 2)     Status: None (Preliminary result)   Collection Time: 05/19/22  5:00 AM   Specimen: BLOOD LEFT ARM  Result Value Ref Range Status   Specimen Description BLOOD LEFT ARM  Final   Special Requests   Final    BOTTLES DRAWN AEROBIC AND ANAEROBIC Blood Culture results may not be optimal due to an  excessive volume of blood received in culture bottles   Culture   Final    NO GROWTH 1 DAY Performed at [facility], [place] [address], [place], [place] [address]    Report Status PENDING  Incomplete  Blood culture (routine x 2)     Status: None (Preliminary result)   Collection Time: 05/19/22  5:00 AM   Specimen: BLOOD LEFT HAND  Result Value Ref Range Status   Specimen Description BLOOD LEFT HAND  Final   Special Requests   Final    BOTTLES DRAWN AEROBIC ONLY Blood Culture results may not be optimal due to an excessive volume of blood received in culture bottles   Culture   Final    NO GROWTH 1 DAY Performed at [facility], [place] [address] [place], [place] [address]    Report Status PENDING  Incomplete  Surgical pcr screen     Status: Abnormal   Collection Time: 05/19/22 11:36 AM   Specimen: Nasal Mucosa; Nasal Swab  Result Value Ref Range Status   MRSA, PCR NEGATIVE NEGATIVE Final   Staphylococcus aureus POSITIVE (A) NEGATIVE Final    Comment: (NOTE) The Xpert SA Assay (FDA approved for NASAL specimens in patients 84 years of age and older), is one component of a comprehensive surveillance program. It is not intended to diagnose infection nor to guide or monitor treatment. Performed at [facility], [place] [address] [place], [place] [address]          Radiology Studies: DG FEMUR MIN 2 VIEWS LEFT  Result Date: 05/19/2022 CLINICAL DATA:  Fluoroscopic assistance for internal fixation of fracture of distal left femur EXAM: LEFT FEMUR 2 VIEWS COMPARISON:  05/18/2022 FINDINGS: Fluoroscopic images show internal fixation of comminuted fracture of distal metaphyseal region of left femur. There is interval placement of intramedullary rod. There is improvement in alignment of fracture fragments. Fluoroscopic time was 1 minute and 43 seconds. Radiation dose was 12.75 mGy. IMPRESSION: Fluoroscopic assistance was provided for internal fixation of fracture of distal left  femur. Electronically Signed   By: Elmer Picker M.D.   On: 05/19/2022 14:32   DG C-Arm 1-60 Min-No Report  Result Date: 05/19/2022 Fluoroscopy was utilized by the requesting physician.  No radiographic interpretation.   CT Knee Left Wo Contrast  Result Date: 05/19/2022 CLINICAL DATA:  Knee fracture. EXAM: CT OF THE LEFT KNEE WITHOUT CONTRAST TECHNIQUE: Multidetector CT imaging of the left knee was performed according to the standard protocol. Multiplanar CT image reconstructions were also generated. RADIATION DOSE REDUCTION: This exam was performed according to the departmental dose-optimization program which includes automated exposure control, adjustment of the mA and/or kV according to patient size and/or use of iterative reconstruction technique. COMPARISON:  05/18/2022. FINDINGS: Bones/Joint/Cartilage There is a comminuted fracture of the supracondylar femur with intra-articular extension and mild posterior displacement of the distal fracture fragment. The remaining bony structures are intact.  There are moderate degenerative changes in the patellofemoral and medial compartments. Mild degenerative changes are noted in the lateral compartment. Enthesopathic changes are noted at the patella. There is a moderate joint effusion. Ligaments Suboptimally assessed by CT. Muscles and Tendons No intramuscular hematoma.  The visualized tendons appear intact. Soft tissues Diffuse subcutaneous edema about the knee. Vascular calcifications are noted in the soft soft tissues. IMPRESSION: 1. Comminuted fracture of the supracondylar distal femur with intra-articular extension and mild posterior displacement of the distal fracture fragment. 2. Moderate joint effusion. 3. Mild-to-moderate degenerative changes at the knee. Electronically Signed   By: Brett Fairy M.D.   On: 05/19/2022 02:08   CT CHEST ABDOMEN PELVIS W CONTRAST  Result Date: 05/19/2022 CLINICAL DATA:  Fall, hit back of head. EXAM: CT CHEST,  ABDOMEN, AND PELVIS WITH CONTRAST TECHNIQUE: Multidetector CT imaging of the chest, abdomen and pelvis was performed following the standard protocol during bolus administration of intravenous contrast. RADIATION DOSE REDUCTION: This exam was performed according to the departmental dose-optimization program which includes automated exposure control, adjustment of the mA and/or kV according to patient size and/or use of iterative reconstruction technique. CONTRAST:  127mL OMNIPAQUE IOHEXOL 300 MG/ML  SOLN COMPARISON:  05/12/2020 FINDINGS: CT CHEST FINDINGS Cardiovascular: Heart is normal size. Aorta is normal caliber. Prior aortic valve repair. Coronary artery and aortic calcifications. Mediastinum/Nodes: No mediastinal, hilar, or axillary adenopathy. Trachea and esophagus are unremarkable. Thyroid unremarkable. Lungs/Pleura: Lungs are clear. No focal airspace opacities or suspicious nodules. No effusions. No pneumothorax Musculoskeletal: No acute bony abnormality. CT ABDOMEN PELVIS FINDINGS Hepatobiliary: Prior cholecystectomy. No focal hepatic abnormality or perihepatic hematoma. Pancreas: No focal abnormality or ductal dilatation. Spleen: No splenic injury or perisplenic hematoma. Adrenals/Urinary Tract: No adrenal hemorrhage or renal injury identified. Bladder is unremarkable. Stomach/Bowel: Normal appendix. Stomach, large and small bowel grossly unremarkable. Vascular/Lymphatic: Aortic atherosclerosis. No evidence of aneurysm or adenopathy. Reproductive: Prior hysterectomy.  No adnexal masses. Other: No free fluid or free air.  Umbilical hernia containing fat. Musculoskeletal: No acute bony abnormality. Stable chronic L1 compression fracture. IMPRESSION: No acute findings or evidence of acute traumatic injury in the chest, abdomen or pelvis. Coronary artery disease, aortic atherosclerosis. Electronically Signed   By: Rolm Baptise M.D.   On: 05/19/2022 01:53   CT CERVICAL SPINE WO CONTRAST  Result Date:  05/19/2022 CLINICAL DATA:  Fall, hit EXAM: CT CERVICAL SPINE WITHOUT CONTRAST TECHNIQUE: Multidetector CT imaging of the cervical spine was performed without intravenous contrast. Multiplanar CT image reconstructions were also generated. RADIATION DOSE REDUCTION: This exam was performed according to the departmental dose-optimization program which includes automated exposure control, adjustment of the mA and/or kV according to patient size and/or use of iterative reconstruction technique. COMPARISON:  05/10/2020 FINDINGS: Alignment: Normal Skull base and vertebrae: No acute fracture. No primary bone lesion or focal pathologic process. Soft tissues and spinal canal: No prevertebral fluid or swelling. No visible canal hematoma. Disc levels:  Diffuse degenerative disc disease and facet disease. Upper chest: No acute findings Other: None IMPRESSION: Degenerative changes.  No acute bony abnormality. Electronically Signed   By: Rolm Baptise M.D.   On: 05/19/2022 01:48   CT HEAD WO CONTRAST  Result Date: 05/19/2022 CLINICAL DATA:  Fall, hit back of head EXAM: CT HEAD WITHOUT CONTRAST TECHNIQUE: Contiguous axial images were obtained from the base of the skull through the vertex without intravenous contrast. RADIATION DOSE REDUCTION: This exam was performed according to the departmental dose-optimization program which includes automated exposure control, adjustment  of the mA and/or kV according to patient size and/or use of iterative reconstruction technique. COMPARISON:  05/10/2020 FINDINGS: Brain: No acute intracranial abnormality. Specifically, no hemorrhage, hydrocephalus, mass lesion, acute infarction, or significant intracranial injury. Vascular: No hyperdense vessel or unexpected calcification. Skull: No acute calvarial abnormality. Sinuses/Orbits: No acute findings Other: None IMPRESSION: No acute intracranial abnormality. Electronically Signed   By: Rolm Baptise M.D.   On: 05/19/2022 01:47   DG Hip Unilat With  Pelvis 2-3 Views Left  Result Date: 05/18/2022 CLINICAL DATA:  Status post fall. EXAM: DG HIP (WITH OR WITHOUT PELVIS) 2-3V LEFT COMPARISON:  None Available. FINDINGS: There is no evidence of hip fracture or dislocation. Degenerative changes are seen in the form of joint space narrowing and acetabular sclerosis. IMPRESSION: No acute osseous abnormality. Electronically Signed   By: Virgina Norfolk M.D.   On: 05/18/2022 23:17   DG Knee Complete 4 Views Left  Result Date: 05/18/2022 CLINICAL DATA:  Status post fall. EXAM: LEFT KNEE - COMPLETE 4+ VIEW COMPARISON:  None Available. FINDINGS: An acute fracture deformity is seen extending through the supracondylar region of the distal left femur. Approximately 1/2 shaft width posterior displacement of the distal fracture site is seen. There is no evidence of dislocation. A large joint effusion is seen. IMPRESSION: Acute fracture of the distal left femur with an associated large joint effusion. Electronically Signed   By: Virgina Norfolk M.D.   On: 05/18/2022 23:16   DG Chest Port 1 View  Result Date: 05/18/2022 CLINICAL DATA:  Fall EXAM: PORTABLE CHEST 1 VIEW COMPARISON:  None Available. FINDINGS: Prior aortic valve repair. Heart is upper limits normal in size. Aortic calcifications. Mediastinal contours within normal limits. No confluent opacities or effusions. No pneumothorax. No acute bony abnormality. IMPRESSION: No active disease. Electronically Signed   By: Rolm Baptise M.D.   On: 05/18/2022 23:15        Scheduled Meds:  atorvastatin  40 mg Oral Daily   Chlorhexidine Gluconate Cloth  6 each Topical Q0600   citalopram  20 mg Oral Daily   enoxaparin (LOVENOX) injection  40 mg Subcutaneous Q24H   gabapentin  300 mg Oral TID   insulin aspart  0-9 Units Subcutaneous Q4H   insulin glargine-yfgn  35 Units Subcutaneous Daily   metoprolol tartrate  12.5 mg Oral BID   mupirocin ointment  1 application  Nasal BID   pantoprazole  40 mg Oral Daily    Continuous Infusions:  ceFEPime (MAXIPIME) IV 2 g (05/20/22 0407)   vancomycin 1,000 mg (05/20/22 1036)     LOS: 1 day    Time spent: 51 minutes spent on chart review, discussion with nursing staff, consultants, updating family and interview/physical exam; more than 50% of that time was spent in counseling and/or coordination of care.    Yides Saidi J [place] DO [facility] Available via Epic secure chat 7am-7pm After these hours, please refer to coverage provider listed on [email or web address] 05/20/2022, 11:15 AM

## 2022-05-20 NOTE — Progress Notes (Signed)
   Subjective:  Patient reports pain as very mild this morning.  Controlled with pain medication.  She has been able to tolerate food.  She is going to an episode of SVT in the PACU which appears better controlled this morning.  Objective:   VITALS:   Vitals:   05/19/22 1603 05/19/22 2033 05/20/22 0300 05/20/22 0741  BP: (!) 107/57 (!) 113/57 133/60 (!) 122/56  Pulse: 93 91 79 67  Resp: 17 20 18 19   Temp: 98.3 F (36.8 C) 99.1 F (37.3 C) 98.8 F (37.1 C) 98 F (36.7 C)  TempSrc: Oral Oral Oral Oral  SpO2: 96% 98% 97% 96%  Weight:      Height:        Left lower extremity dressings are clean dry and intact.  Knee immobilizer in place.  She fires tibialis anterior as well as EHL and gastrocsoleus.  Again difficult to palpate dorsalis pedis pulse although all of her toes are warm and well-perfused   Lab Results  Component Value Date   WBC 10.3 05/20/2022   HGB 9.4 (L) 05/20/2022   HCT 29.7 (L) 05/20/2022   MCV 86.1 05/20/2022   PLT 138 (L) 05/20/2022     Assessment/Plan:  1 Day Post-Op status post left femoral retrograde nail overall doing well  - Expected postop acute blood loss anemia - will monitor for symptoms - Patient to work with PT/OT to optimize mobilization safely - DVT ppx - SCDs, ambulation, Lovenox daily - Postoperative Abx: Ancef x 2 additional doses given - PWB operative extremity: 50% weightbearing in knee immobilizer for 2 weeks - Pain control - multimodal pain management, ATC acetaminophen in conjunction with as needed narcotic (oxycodone), although this should be minimized with other modalities  - Discharge planning pending CM, appreciate coordination    [name] 05/20/2022, 8:06 AM

## 2022-05-20 NOTE — TOC CAGE-AID Note (Signed)
Transition of Care [facility]) - CAGE-AID Screening   Patient Details  Name: [name] MRN: [id number] Date of Birth: 1946/06/22  [facility] CM/SW Contact:    Lossie Faes Tarpley-Carter, LCSWA Phone Number: 05/20/2022, 2:32 PM   Clinical Narrative: Pt participated in Cage-Aid.  Pt stated she does not use substance or ETOH.  Pt was not offered resources, due to no usage of substance or ETOH.    Shimshon Narula Tarpley-Carter, MSW, LCSW-A Pronouns:  She/Her/Hers [facility]itions of Care Clinical Social Worker Direct Number:  [phone] [email or web address]  CAGE-AID Screening:    Have You Ever Felt You Ought to Cut Down on Your Drinking or Drug Use?: No Have People Annoyed You By [employer] Your Drinking Or Drug Use?: No Have You Felt Bad Or Guilty About Your Drinking Or Drug Use?: No Have You Ever Had a Drink or Used Drugs First Thing In The Morning to Steady Your Nerves or to Get Rid of a Hangover?: No CAGE-AID Score: 0  Substance Abuse Education Offered: No

## 2022-05-21 DIAGNOSIS — K219 Gastro-esophageal reflux disease without esophagitis: Secondary | ICD-10-CM | POA: Diagnosis not present

## 2022-05-21 DIAGNOSIS — S7292XA Unspecified fracture of left femur, initial encounter for closed fracture: Secondary | ICD-10-CM | POA: Diagnosis not present

## 2022-05-21 DIAGNOSIS — E1169 Type 2 diabetes mellitus with other specified complication: Secondary | ICD-10-CM

## 2022-05-21 DIAGNOSIS — I10 Essential (primary) hypertension: Secondary | ICD-10-CM | POA: Diagnosis not present

## 2022-05-21 DIAGNOSIS — E785 Hyperlipidemia, unspecified: Secondary | ICD-10-CM

## 2022-05-21 DIAGNOSIS — E1142 Type 2 diabetes mellitus with diabetic polyneuropathy: Secondary | ICD-10-CM

## 2022-05-21 DIAGNOSIS — L89101 Pressure ulcer of unspecified part of back, stage 1: Secondary | ICD-10-CM

## 2022-05-21 LAB — CBC
HCT: 28.3 % — ABNORMAL LOW (ref 36.0–46.0)
Hemoglobin: 8.9 g/dL — ABNORMAL LOW (ref 12.0–15.0)
MCH: 27.5 pg (ref 26.0–34.0)
MCHC: 31.4 g/dL (ref 30.0–36.0)
MCV: 87.3 fL (ref 80.0–100.0)
Platelets: 129 10*3/uL — ABNORMAL LOW (ref 150–400)
RBC: 3.24 MIL/uL — ABNORMAL LOW (ref 3.87–5.11)
RDW: 13.3 % (ref 11.5–15.5)
WBC: 13 10*3/uL — ABNORMAL HIGH (ref 4.0–10.5)
nRBC: 0 % (ref 0.0–0.2)

## 2022-05-21 LAB — BASIC METABOLIC PANEL
Anion gap: 4 — ABNORMAL LOW (ref 5–15)
BUN: 24 mg/dL — ABNORMAL HIGH (ref 8–23)
CO2: 26 mmol/L (ref 22–32)
Calcium: 8 mg/dL — ABNORMAL LOW (ref 8.9–10.3)
Chloride: 105 mmol/L (ref 98–111)
Creatinine, Ser: 0.74 mg/dL (ref 0.44–1.00)
GFR, Estimated: >60 mL/min (ref >60)
Glucose, Bld: 200 mg/dL — ABNORMAL HIGH (ref 70–99)
Potassium: 4.4 mmol/L (ref 3.5–5.1)
Sodium: 135 mmol/L (ref 135–145)

## 2022-05-21 LAB — GLUCOSE, CAPILLARY
Glucose-Capillary: 183 mg/dL — ABNORMAL HIGH (ref 70–99)
Glucose-Capillary: 169 mg/dL — ABNORMAL HIGH (ref 70–99)
Glucose-Capillary: 250 mg/dL — ABNORMAL HIGH (ref 70–99)
Glucose-Capillary: 220 mg/dL — ABNORMAL HIGH (ref 70–99)

## 2022-05-21 LAB — MAGNESIUM: Magnesium: 1.9 mg/dL (ref 1.7–2.4)

## 2022-05-21 NOTE — TOC Initial Note (Addendum)
Transition of Care [facility]) - Initial/Assessment Note    Patient Details  Name: [name] MRN: [id number] Date of Birth: 12-Apr-1946  Transition of Care [facility]) CM/SW Contact:    Joanne Chars, LCSW Phone Number: 05/21/2022, 11:24 AM  Clinical Narrative:     CSW met with pt regarding DC recommendation for SNF.  Pt agreeable to SNF, choice document given, was at [facility] before and would be interested in returning.  Pt lives alone, no services at home.  Permission given to speak with niece [name]. Permission given to send out referrral in the hub.    Referral sent out in hub, [facility] notified.      1545: [place] does offer bed, pt accepts, Auth request submitted in [place].             Expected Discharge Plan: Skilled Nursing Facility Barriers to Discharge: Continued Medical Work up, SNF Pending bed offer   Patient Goals and CMS Choice   CMS Medicare.gov Compare Post Acute Care list provided to:: Patient Choice offered to / list presented to : Patient  Expected Discharge Plan and Services Expected Discharge Plan: [place] In-house Referral: Clinical Social Work   Post Acute Care Choice: [place] Living arrangements for the past 2 months: [place]                                      Prior Living Arrangements/Services Living arrangements for the past 2 months: Single Family Home Lives with:: Self Patient language and need for interpreter reviewed:: Yes Do you feel safe going back to the place where you live?: Yes      Need for Family Participation in Patient Care: Yes (Comment) Care giver support system in place?: Yes (comment) Current home services: Other (comment) (none) Criminal Activity/Legal Involvement Pertinent to Current Situation/Hospitalization: No - Comment as needed  Activities of Daily Living Home Assistive Devices/Equipment: Environmental consultant (specify type), Wheelchair (chair lift, CBG broken -needs a new one) ADL  Screening (condition at time of admission) Patient's cognitive ability adequate to safely complete daily activities?: Yes Is the patient deaf or have difficulty hearing?: No Does the patient have difficulty seeing, even when wearing glasses/contacts?: No Does the patient have difficulty concentrating, remembering, or making decisions?: No Patient able to express need for assistance with ADLs?: Yes Does the patient have difficulty dressing or bathing?: Yes Independently performs ADLs?: No Communication: Independent Dressing (OT): Needs assistance Is this a change from baseline?: Change from baseline, expected to last >3 days Grooming: Needs assistance Is this a change from baseline?: Change from baseline, expected to last >3 days Feeding: Independent Bathing: Dependent Is this a change from baseline?: Change from baseline, expected to last >3 days Toileting: Needs assistance, Dependent Is this a change from baseline?: Change from baseline, expected to last >3days In/Out Bed: Dependent Is this a change from baseline?: Change from baseline, expected to last >3 days Walks in Home: Needs assistance Is this a change from baseline?: Pre-admission baseline Does the patient have difficulty walking or climbing stairs?: Yes Weakness of Legs: Left Weakness of Arms/Hands: None  Permission Sought/Granted Permission sought to share information with : Family Supports Permission granted to share information with : Yes, Verbal Permission Granted  Share Information with NAME: niece [name]  Permission granted to share info w AGENCY: SNF        Emotional Assessment Appearance:: Appears older than stated  age Attitude/Demeanor/Rapport: Engaged Affect (typically observed): Appropriate, Pleasant Orientation: : Oriented to Self, Oriented to Place, Oriented to  Time, Oriented to Situation      Admission diagnosis:  Femur fracture ([place]) [S72.90XA] Fall, initial encounter [W19.XXXA] Closed displaced  supracondylar fracture of distal end of left femur with intracondylar extension, initial encounter [facility]) [S72.462A] Patient Active Problem List   Diagnosis Date Noted   Pressure injury of back, stage 1 05/21/2022   Femur fracture ([place]) 05/19/2022   Leucocytosis 05/19/2022   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, limited to breakdown of skin ([place]) 01/03/2022   Fall at home 07/09/2021   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   Near syncope 05/11/2020   Pressure injury of skin 05/11/2020   Orthostatic hypotension 05/11/2020   Dental caries 05/11/2020   Aortic stenosis    Diabetic neuropathy ([place]) 11/13/2018   Left knee pain 10/24/2017   Greater trochanteric bursitis of left hip 10/24/2017   Atypical chest pain 10/10/2015   Reactive airway disease 04/09/2014   Seasonal allergic rhinitis 04/09/2014   Preventative health care 04/09/2014   Abnormality of gait 03/17/2013   Irritable bowel syndrome (IBS) 08/27/2012   Shoulder pain 09/25/2009   Type 2 diabetes with complication ([place]) 33/78/0108   MDD (major depressive disorder) 06/18/2007   GERD 06/18/2007   Hyperlipidemia associated with type 2 diabetes mellitus ([place]) 12/27/2006   Essential hypertension 09/19/2006   Venous (peripheral) insufficiency 09/19/2006   PCP:  Sandrea Hughs, NP Pharmacy:   [facility] [address] [place] [address] Phone: [phone] Fax: [phone]  [facility] ([facility] ) - [place], [place] - [address] [place] [address] Phone: [phone] Fax: [phone]     Social Determinants of Health (SDOH) Interventions    Readmission Risk Interventions     No data to display

## 2022-05-21 NOTE — Progress Notes (Signed)
   Subjective:  Patient reports pain as very mild this morning. Was able to work with PT to get to chair with significant two person assist.  Objective:   VITALS:   Vitals:   05/20/22 0741 05/20/22 1356 05/20/22 2051 05/21/22 0500  BP: (!) 122/56 (!) 111/97 (!) 120/52 (!) 118/48  Pulse: 67 85 98 83  Resp: 19  18 18   Temp: 98 F (36.7 C) 99.3 F (37.4 C) 98 F (36.7 C) 98.3 F (36.8 C)  TempSrc: Oral   Oral  SpO2: 96% 94% 90% 95%  Weight:      Height:        Left lower extremity dressings are clean dry and intact.  Knee immobilizer in place.  She fires tibialis anterior as well as EHL and gastrocsoleus.  Again difficult to palpate dorsalis pedis pulse although all of her toes are warm and well-perfused   Lab Results  Component Value Date   WBC 13.0 (H) 05/21/2022   HGB 8.9 (L) 05/21/2022   HCT 28.3 (L) 05/21/2022   MCV 87.3 05/21/2022   PLT 129 (L) 05/21/2022     Assessment/Plan:  2 Days Post-Op status post left femoral retrograde nail overall doing well  - Expected postop acute blood loss anemia - will monitor for symptoms - Patient to work with PT/OT to optimize mobilization safely - DVT ppx - SCDs, ambulation, Lovenox daily - PWB operative extremity: 50% weightbearing in knee immobilizer for 2 weeks - Pain control - multimodal pain management, ATC acetaminophen in conjunction with as needed narcotic (oxycodone), although this should be minimized with other modalities  - Discharge planning pending CM, appreciate coordination    [place] 05/21/2022, 7:34 AM

## 2022-05-21 NOTE — NC FL2 (Signed)
[place] MEDICAID FL2 LEVEL OF CARE SCREENING TOOL     IDENTIFICATION  Patient Name: [name] Birthdate: [birth year] Sex: female Admission Date (Current Location): 05/18/2022  [facility] and [place] Number:  Herbalist and Address:  [facility], [place] [address] [place], [place] [address]      Provider Number: [id number]  Attending Physician Name and Address:  Flora Lipps, MD  Relative Name and Phone Number:  Lennox Pippins Niece   [phone]    Current Level of Care: Hospital Recommended Level of Care: [place] Prior Approval Number:    Date Approved/Denied:   PASRR Number: [id number]  Discharge Plan: SNF    Current Diagnoses: Patient Active Problem List   Diagnosis Date Noted   Pressure injury of back, stage 1 05/21/2022   Femur fracture ([place]) 05/19/2022   Leucocytosis 05/19/2022   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, limited to breakdown of skin ([place]) 01/03/2022   Fall at home 07/09/2021   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   Near syncope 05/11/2020   Pressure injury of skin 05/11/2020   Orthostatic hypotension 05/11/2020   Dental caries 05/11/2020   Aortic stenosis    Diabetic neuropathy ([place]) 11/13/2018   Left knee pain 10/24/2017   Greater trochanteric bursitis of left hip 10/24/2017   Atypical chest pain 10/10/2015   Reactive airway disease 04/09/2014   Seasonal allergic rhinitis 04/09/2014   Preventative health care 04/09/2014   Abnormality of gait 03/17/2013   Irritable bowel syndrome (IBS) 08/27/2012   Shoulder pain 09/25/2009   Type 2 diabetes with complication ([place]) [id number]   MDD (major depressive disorder) 06/18/2007   GERD 06/18/2007   Hyperlipidemia associated with type 2 diabetes mellitus ([place]) 12/27/2006   Essential hypertension 09/19/2006   Venous (peripheral) insufficiency 09/19/2006    Orientation  RESPIRATION BLADDER Height & Weight     Self, Time, Situation, Place  Normal Continent, External catheter Weight: 184 lb 11.9 oz (83.8 kg) Height:  5\' 4"  (162.6 cm)  BEHAVIORAL SYMPTOMS/MOOD NEUROLOGICAL BOWEL NUTRITION STATUS      Incontinent Diet (see discharge summary)  AMBULATORY STATUS COMMUNICATION OF NEEDS Skin   Total Care Verbally Surgical wounds                       Personal Care Assistance Level of Assistance  Bathing, Feeding, Dressing Bathing Assistance: Maximum assistance Feeding assistance: Limited assistance Dressing Assistance: Maximum assistance     Functional Limitations Info  Sight, Hearing, Speech Sight Info: Adequate Hearing Info: Adequate Speech Info: Adequate    SPECIAL CARE FACTORS FREQUENCY  PT (By licensed PT), OT (By licensed OT)     PT Frequency: 5x week OT Frequency: 5x week            Contractures Contractures Info: Not present    Additional Factors Info  Code Status, Allergies, Insulin Sliding Scale Code Status Info: full Allergies Info: Morphine, Codeine Sulfate, Demerol (Meperidine), Propoxyphene Hcl   Insulin Sliding Scale Info: Novolog: 0-15 units TID with meals, 0-5 units QHS, see discharge summary.       Current Medications (05/21/2022):  This is the current hospital active medication list Current Facility-Administered Medications  Medication Dose Route Frequency Provider Last Rate Last Admin   acetaminophen (TYLENOL) tablet 650 mg  650 mg Oral Q6H PRN Vanetta Mulders, MD   650 mg at 05/21/22 0503   Or   acetaminophen (TYLENOL) suppository 650  mg  650 mg Rectal Q6H PRN Vanetta Mulders, MD       atorvastatin (LIPITOR) tablet 40 mg  40 mg Oral Daily [place] Donnamarie Poag, DO   40 mg at 05/21/22 1028   ceFAZolin (ANCEF) IVPB 2g/100 mL premix  2 g Intravenous Q8H [place] Donnamarie Poag, DO 200 mL/hr at 05/21/22 0504 2 g at 05/21/22 0504   Chlorhexidine Gluconate Cloth 2 % PADS 6 each  6 each Topical Q0600 [place] Eric J, DO   6 each at  05/21/22 0603   citalopram (CELEXA) tablet 20 mg  20 mg Oral Daily [place] Donnamarie Poag, DO   20 mg at 05/21/22 1028   enoxaparin (LOVENOX) injection 40 mg  40 mg Subcutaneous Q24H Vanetta Mulders, MD   40 mg at 05/21/22 1028   ezetimibe (ZETIA) tablet 10 mg  10 mg Oral Daily [place] Donnamarie Poag, DO   10 mg at 05/21/22 1028   fentaNYL (SUBLIMAZE) injection 25 mcg  25 mcg Intravenous Q2H PRN Vanetta Mulders, MD   25 mcg at 05/20/22 0604   gabapentin (NEURONTIN) capsule 300 mg  300 mg Oral TID [place] Eric J, DO   300 mg at 05/21/22 1028   insulin aspart (novoLOG) injection 0-15 Units  0-15 Units Subcutaneous TID WC [place] Eric J, DO   8 Units at 05/20/22 1751   insulin aspart (novoLOG) injection 0-5 Units  0-5 Units Subcutaneous QHS [place] Eric J, DO   2 Units at 05/20/22 2134   insulin glargine-yfgn [facility]) injection 35 Units  35 Units Subcutaneous Daily [place] Eric J, DO   35 Units at 05/21/22 1028   metoprolol tartrate (LOPRESSOR) tablet 12.5 mg  12.5 mg Oral BID Vanetta Mulders, MD   12.5 mg at 05/21/22 1028   mupirocin ointment (BACTROBAN) 2 % 1 application   1 application  Nasal BID [place] Eric J, DO   1 application  at [id number]   oxyCODONE (Oxy IR/ROXICODONE) immediate release tablet 5 mg  5 mg Oral Q6H PRN [place] Eric J, DO       pantoprazole (PROTONIX) EC tablet 40 mg  40 mg Oral Daily [place] Donnamarie Poag, DO   40 mg at 05/21/22 1028     Discharge Medications: Please see discharge summary for a list of discharge medications.  Relevant Imaging Results:  Relevant Lab Results:   Additional Information SSN [id number], pt is vaccinated for covid with one booster.  Joanne Chars, LCSW

## 2022-05-21 NOTE — Evaluation (Signed)
Occupational Therapy Evaluation Patient Details Name: [name] MRN: [id number] DOB: 06-22-1946 Today's Date: 05/21/2022   History of Present Illness 76 y.o. female who presented to [facility] ED on 6/10 via EMS complaining of left hip pain following a fall.  Patient reports she was trying to stand up from the easy chair when she lost her balance and fell.  Undersent IM nail left femur fx on 6/11.  PMH:  aortic stenosis s/p TAVR, type 2 diabetes mellitus, HLD, CKD stage IIIa, HTN, GERD, depression, chronic diastolic CHF, peripheral neuropathy, chronic LE lymphedema   Clinical Impression   Patient is s/p IM nail L femur surgery resulting in functional limitations due to the deficits listed below (see OT problem list). Pt bed level evaluation at this time due to discomfort and (A) needed is 2 person after bed mobility attempts. Recommendation for air mattress communicated and pravalon boots to MD. Pt the entire session order buttock discomfort and noted to have redness. Pt could benefit from frequent turning on current mattress to prevent sacral wound.  Patient will benefit from skilled OT acutely to increase independence and safety with ADLS to allow discharge SNF ( agreeable).       Recommendations for follow up therapy are one component of a multi-disciplinary discharge planning process, led by the attending physician.  Recommendations may be updated based on patient status, additional functional criteria and insurance authorization.   Follow Up Recommendations  Skilled nursing-short term rehab (<3 hours/day)    Assistance Recommended at Discharge Intermittent Supervision/Assistance  Patient can return home with the following Two people to help with walking and/or transfers;Two people to help with bathing/dressing/bathroom;Assist for transportation    Functional Status Assessment  Patient has had a recent decline in their functional status and demonstrates the ability to make significant  improvements in function in a reasonable and predictable amount of time.  Equipment Recommendations  Wheelchair (measurements OT);Wheelchair cushion (measurements OT);Hospital bed    Recommendations for Other Services       Precautions / Restrictions Precautions Precautions: Fall Required Braces or Orthoses: Knee Immobilizer - Left Knee Immobilizer - Left: Other (comment) (Pt is not to remove for 2 weeks per MD) Restrictions Weight Bearing Restrictions: Yes RLE Weight Bearing: Partial weight bearing RLE Partial Weight Bearing Percentage or Pounds: 50      Mobility Bed Mobility Overal bed mobility: Needs Assistance Bed Mobility: Rolling Rolling: Mod assist         General bed mobility comments: pt rolling toward the R using bed rail and sustaining herself on her side with mod (A). pt requires pillow between BIL LE to help support L LE. Pt log rolling onto back MOD (A). pt with bed tilted and pad used in addition to pt using bil UE on hob to pull up toward the head of the bed for better positioning    Transfers                          Balance                                           ADL either performed or assessed with clinical judgement   ADL Overall ADL's : Needs assistance/impaired Eating/Feeding: Set up;Bed level   Grooming: Wash/dry hands;Set up;Bed level Grooming Details (indicate cue type and reason): unable to open hand wipes Upper  Body Bathing: Minimal assistance;Bed level   Lower Body Bathing: Maximal assistance;Bed level   Upper Body Dressing : Minimal assistance;Bed level   Lower Body Dressing: Maximal assistance                 General ADL Comments: pt limited by LE discomfort with bed mobility but tolerates to have skin checked and make sure no incontinence of stool . pt reports feeling unclean but when checked was clean     Vision         Perception     Praxis      Pertinent Vitals/Pain Pain  Assessment Pain Assessment: Faces Faces Pain Scale: Hurts even more Pain Location: buttocks Pain Descriptors / Indicators: Aching, Discomfort, Grimacing, Guarding Pain Intervention(s): Repositioned, Monitored during session (requesting air mattress)     Hand Dominance Right   Extremity/Trunk Assessment Upper Extremity Assessment Upper Extremity Assessment: Overall WFL for tasks assessed   Lower Extremity Assessment Lower Extremity Assessment: Defer to PT evaluation       Communication Communication Communication: No difficulties   Cognition Arousal/Alertness: Awake/alert Behavior During Therapy: WFL for tasks assessed/performed Overall Cognitive Status: Impaired/Different from baseline Area of Impairment: Following commands, Safety/judgement, Awareness, Problem solving, Orientation, Memory                 Orientation Level: Disoriented to, Place, Situation, Time (does not know she is in hospital but when told she is at [facility] she knows she is in [place])   Memory: Decreased short-term memory Following Commands: Follows one step commands inconsistently, Follows one step commands with increased time Safety/Judgement: Decreased awareness of safety, Decreased awareness of deficits Awareness: Intellectual Problem Solving: Decreased initiation, Difficulty sequencing, Requires verbal cues General Comments: pt reports her 42 yo son is helping his father in [place] that has Cancer. Pt is able to give nieces name in the chart. pt is very pleasant and continues to complain of pain around her buttock     General Comments  noted to have redness near rectum with  skin check. pt with barrier cream present. Pt could benefit from air mattress. pt with pitting edema in bil feet. Pt with bil heels floated on foam in room and socks removed. pt was given two warm blankets to take off socks for skin integrity at the ankles from socks    Exercises     Shoulder  Instructions      Home Living Family/patient expects to be discharged to:: Private residence Living Arrangements: Alone Available Help at Discharge: Family;Friend(s);Available PRN/intermittently Type of Home: House Home Access: Ramped entrance     Home Layout: One level     Bathroom Shower/Tub: Tub/shower unit;Walk-in shower   Bathroom Toilet: Handicapped height Bathroom Accessibility: Yes   Home Equipment: Rollator (4 wheels);Cane - single point;Grab bars - toilet;Wheelchair - manual;Shower seat;Grab bars - tub/shower;Hand held shower head   Additional Comments: Pt. reports she has multiple canes strategically placed around her house.  Tends to wall walk or use canes to mobilize around house.  States house is too small for her rollator, but that she does use it to amb to mailbox.      Prior Functioning/Environment Prior Level of Function : Independent/Modified Independent;History of Falls (last six months) (1 other fall)               ADLs Comments: Vladimir Faster delivers groceries        OT Problem List: Decreased strength;Decreased activity tolerance;Impaired balance (sitting and/or standing);Decreased cognition;Decreased  safety awareness;Decreased knowledge of use of DME or AE;Decreased knowledge of precautions;Cardiopulmonary status limiting activity;Obesity;Pain;Increased edema      OT Treatment/Interventions: Self-care/ADL training;Therapeutic exercise;DME and/or AE instruction;Manual therapy;Modalities;Therapeutic activities;Cognitive remediation/compensation;Patient/family education;Balance training    OT Goals(Current goals can be found in the care plan section) Acute Rehab OT Goals Patient Stated Goal: to make my butt not hurt OT Goal Formulation: Patient unable to participate in goal setting Time For Goal Achievement: 06/04/22 Potential to Achieve Goals: Good  OT Frequency: Min 2X/week    Co-evaluation              AM-PAC OT "6 Clicks" Daily Activity      Outcome Measure Help from another person eating meals?: A Little Help from another person taking care of personal grooming?: A Little Help from another person toileting, which includes using toliet, bedpan, or urinal?: A Lot Help from another person bathing (including washing, rinsing, drying)?: A Lot Help from another person to put on and taking off regular upper body clothing?: A Little Help from another person to put on and taking off regular lower body clothing?: A Lot 6 Click Score: 15   End of Session Equipment Utilized During Treatment: Left knee immobilizer Nurse Communication: Mobility status;Precautions;Need for lift equipment  Activity Tolerance: Patient tolerated treatment well Patient left: in bed;with call bell/phone within reach;with bed alarm set  OT Visit Diagnosis: Unsteadiness on feet (R26.81);Muscle weakness (generalized) (M62.81)                Time: [id number] OT Time Calculation (min): 24 min Charges:  OT General Charges $OT Visit: 1 Visit OT Evaluation $OT Eval Moderate Complexity: 1 Mod   Brynn, OTR/L  Acute Rehabilitation Services Office: [phone] .   Jeri Modena 05/21/2022, 3:59 PM

## 2022-05-21 NOTE — Inpatient Diabetes Management (Signed)
Inpatient Diabetes Program Recommendations  AACE/ADA: New Consensus Statement on Inpatient Glycemic Control   Target Ranges:  Prepandial:   less than 140 mg/dL      Peak postprandial:   less than 180 mg/dL (1-2 hours)      Critically ill patients:  140 - 180 mg/dL    Latest Reference Range & Units 05/20/22 07:34 05/20/22 11:13 05/20/22 16:14 05/20/22 20:50 05/21/22 08:40  Glucose-Capillary 70 - 99 mg/dL 382 (H) 505 (H) 397 (H) 240 (H) 183 (H)   Review of Glycemic Control  Diabetes history: DM2 Outpatient Diabetes medications: Tresiba daily, Rybelsus 14 mg daily, Metformin 500 mg BID Current orders for Inpatient glycemic control: Semglee 35 units daily, Novolog 0-15 units TID with meals, Novolog 0-5 units QHS   Inpatient Diabetes Program Recommendations:     Insulin: Please consider ordering Novolog 3 units TID with meals for meal coverage if patient eats at least 50% of meals.  HbgA1C:  A1C over 14% on 04/24/22 indicating an average glucose of over 355 mg/dl.  Patient reports she forgets to take medications at times and would like to have someone help her at home.  Thanks, Orlando Penner, RN, MSN, CDCES Diabetes Coordinator Inpatient Diabetes Program [phone] (Team Pager from 8am to 5pm)

## 2022-05-21 NOTE — Progress Notes (Signed)
Mobility Specialist Progress Note   05/21/22 1537  Mobility  Activity Dangled on edge of bed  Level of Assistance +2 (takes two people)  Assistive Device  (HHA)  RLE Weight Bearing PWB  Activity Response Tolerated well  $Mobility charge 1 Mobility   Post Mobility: 123/53 BP  Received pt in bed having waves of burning pain in LLE claiming it was 6/10 but still agreeable w/ mobility. +2 MinA to get pt to EOB d/t dec'd pain tolerance in LE's and trunk weakness. Dangled EOB for ~15mns until pt stated to be feeling dizzy. Then proceeded to lay pt back supine for comfort. BP sitting at 123/53 and pt stating to be feeling better. Made sure needs were met, call bell was in reach and bed alarm on.  JHolland FallingMobility Specialist Phone Number [phone]

## 2022-05-21 NOTE — Progress Notes (Signed)
PROGRESS NOTE    [name]  [id number] DOB: [birth year] DOA: 05/18/2022 PCP: Sandrea Hughs, NP    Brief Narrative:   Chalet Ketcham is a 76 y.o. female with past medical history of aortic stenosis status post TAVR, type 2 diabetes, hyperlipidemia, CKD stage IIIa, hypertension, GERD, depression, chronic diastolic heart failure, peripheral neuropathy, chronic lower extremity lymphedema presented to the hospital after a fall.  She complains of left hip pain and could not weight-bear.  In the ED vitals were stable.  Sodium was 135.  X-ray of the left hip showed left distal femur fracture.  Orthopedics was consulted and patient was admitted hospital for further evaluation and treatment.  Assessment & Plan:   Principal Problem:   Femur fracture ([place]) Active Problems:   Type 2 diabetes with complication (HCC)   Hyperlipidemia associated with type 2 diabetes mellitus (HCC)   Essential hypertension   GERD   Diabetic neuropathy (HCC)   S/P TAVR (transcatheter aortic valve replacement)   Leucocytosis   Pressure injury of back, stage 1   Left distal femoral supracondylar fracture Status post mechanical fall.  Orthopedics on board and patient underwent  left femoral retrograde IM nail by Dr. Sammuel Hines on 05/19/2022. 50% weightbearing with knee immobilizer LLE x2 weeks.  Physical therapy has seen the patient and at this time awaiting for skilled nursing facility placement.  Postoperative blood loss anemia, expected Hemoglobin at 8.9 from 9.4.  We will continue to monitor.  Transfuse as necessary for hemoglobin less than 7 or ongoing bleeding.  Lower extremity cellulitis With underlying lymphedema, venous stasis.  Continue cefazolin, anticipate 7-day total antibiotic course.  WBC at 8.9.  No fever.  Hypomagnesemia Improved after replacement.  Latest magnesium of 1.9.  Type 2 diabetes mellitus with hyperglycemia Hemoglobin A1c greater than 14.0 on 04/24/2022, poorly  controlled.  Patient is on metformin and Tresiba and semaglutide at home.  Continue long-acting and sliding scale insulin while in the hospital.   Essential hypertension Chronic diastolic congestive heart failure, compensated TTE 06/07/2021 with LVEF 60-65%, noted 23 mm SAPIEN prosthetic TAVR valve in the aortic position, grade 2 diastolic dysfunction, LA mildly dilated, IVC normal in size.  Patient is on metoprolol tartrate 12.5 mg p.o. twice daily, torsemide 20 mg p.o. daily at home.  Continue metoprolol.  Torsemide on hold at this time.  Continue strict intake and output charting  Hyperlipidemia Continue atorvastatin and Zetia.  Depression/anxiety:  Continue Celexa  GERD: Continue PPI  CKD stage IIIa Latest creatinine at 0.7.  At baseline.  Peripheral neuropathy Continue gabapentin  Pressure injury sacral area..  Stage I.  Will need prevention protocol. Pressure Injury 05/19/22 Sacrum Stage 1 -  Intact skin with non-blanchable redness of a localized area usually over a bony prominence. (Active)  05/19/22 1600  Location: Sacrum  Location Orientation:   Staging: Stage 1 -  Intact skin with non-blanchable redness of a localized area usually over a bony prominence.  Wound Description (Comments):   Present on Admission: Yes     DVT prophylaxis: enoxaparin (LOVENOX) injection 40 mg Start: 05/20/22 1000 SCDs Start: 05/19/22 0537    Code Status: Full Code  Family Communication:  No family at bedside  Disposition Plan:  Level of care: Telemetry Medical  Status is: Inpatient  Remains inpatient appropriate because: lives alone,  need for SNF placement.    Consultants:  Orthopedics, Dr. Sammuel Hines  Procedures:  Left retrograde femoral nail, Dr. Sammuel Hines, 6/11  Antimicrobials:  Vancomycin 6/11 - 6/12  Cefepime 6/11 - 6/12 Cefazolin 6/11>>   Subjective: Today, patient was seen and examined at bedside complains of dry eyes.  Denies overt pain, nausea, vomiting, fever or  chills   Objective: Vitals:   05/20/22 1356 05/20/22 2051 05/21/22 0500 05/21/22 0833  BP: (!) 111/97 (!) 120/52 (!) 118/48 (!) 112/53  Pulse: 85 98 83 80  Resp:  18 18 18   Temp: 99.3 F (37.4 C) 98 F (36.7 C) 98.3 F (36.8 C) 99.2 F (37.3 C)  TempSrc:   Oral   SpO2: 94% 90% 95% 96%  Weight:      Height:        Intake/Output Summary (Last 24 hours) at 05/21/2022 1020 Last data filed at 05/21/2022 0840 Gross per 24 hour  Intake 500 ml  Output 400 ml  Net 100 ml   Filed Weights   05/18/22 2246  Weight: 83.8 kg   Body mass index is 31.71 kg/m.   Examination:  General: Obese built, not in obvious distress, appears chronically ill HENT:   No scleral pallor or icterus noted. Oral mucosa is moist.  Chest:  Clear breath sounds.  Diminished breath sounds bilaterally. No crackles or wheezes.  CVS: S1 &S2 heard. No murmur.  Regular rate and rhythm. Abdomen: Soft, nontender, nondistended.  Bowel sounds are heard.   Extremities: Bilateral lower extremity edema with some erythema, left knee immobilizer in place.Marland Kitchen Psych: Alert, awake and oriented, normal mood CNS:  No cranial nerve deficits.  Power equal in all extremities.   Skin: Warm and dry.  Chronic skin changes over the bilateral lower extremities with erythema.  Data Reviewed: I have personally reviewed following labs and imaging studies  CBC: Recent Labs  Lab 05/19/22 0218 05/20/22 0149 05/21/22 0145  WBC 20.6* 10.3 13.0*  NEUTROABS 17.6*  --   --   HGB 13.1 9.4* 8.9*  HCT 39.0 29.7* 28.3*  MCV 84.2 86.1 87.3  PLT 202 138* Q000111Q*   Basic Metabolic Panel: Recent Labs  Lab 05/18/22 2238 05/20/22 0149 05/21/22 0145  NA 135 135 135  K 4.4 4.0 4.4  CL 101 103 105  CO2 21* 28 26  GLUCOSE 355* 279* 200*  BUN 21 23 24*  CREATININE 0.75 0.85 0.74  CALCIUM 9.0 8.2* 8.0*  MG  --  1.6* 1.9   GFR: Estimated Creatinine Clearance: 63.6 mL/min (by C-G formula based on SCr of 0.74 mg/dL). Liver Function  Tests: Recent Labs  Lab 05/18/22 2238  AST 26  ALT 29  ALKPHOS 168*  BILITOT 0.3  PROT 6.7  ALBUMIN 2.8*   No results for input(s): "LIPASE", "AMYLASE" in the last 168 hours. No results for input(s): "AMMONIA" in the last 168 hours. Coagulation Profile: No results for input(s): "INR", "PROTIME" in the last 168 hours. Cardiac Enzymes: No results for input(s): "CKTOTAL", "CKMB", "CKMBINDEX", "TROPONINI" in the last 168 hours. BNP (last 3 results) No results for input(s): "PROBNP" in the last 8760 hours. HbA1C: No results for input(s): "HGBA1C" in the last 72 hours. CBG: Recent Labs  Lab 05/20/22 0734 05/20/22 1113 05/20/22 1614 05/20/22 2050 05/21/22 0840  GLUCAP 163* 163* 288* 240* 183*   Lipid Profile: No results for input(s): "CHOL", "HDL", "LDLCALC", "TRIG", "CHOLHDL", "LDLDIRECT" in the last 72 hours. Thyroid Function Tests: No results for input(s): "TSH", "T4TOTAL", "FREET4", "T3FREE", "THYROIDAB" in the last 72 hours. Anemia Panel: No results for input(s): "VITAMINB12", "FOLATE", "FERRITIN", "TIBC", "IRON", "RETICCTPCT" in the last 72 hours. Sepsis Labs: Recent Labs  Lab 05/19/22 0500  05/19/22 1620  LATICACIDVEN 2.0* 1.2    Recent Results (from the past 240 hour(s))  Blood culture (routine x 2)     Status: None (Preliminary result)   Collection Time: 05/19/22  5:00 AM   Specimen: BLOOD LEFT ARM  Result Value Ref Range Status   Specimen Description BLOOD LEFT ARM  Final   Special Requests   Final    BOTTLES DRAWN AEROBIC AND ANAEROBIC Blood Culture results may not be optimal due to an excessive volume of blood received in culture bottles   Culture   Final    NO GROWTH 1 DAY Performed at [facility], [place] [address], [place], [place] [address]    Report Status PENDING  Incomplete  Blood culture (routine x 2)     Status: None (Preliminary result)   Collection Time: 05/19/22  5:00 AM   Specimen: BLOOD LEFT HAND  Result Value Ref Range Status    Specimen Description BLOOD LEFT HAND  Final   Special Requests   Final    BOTTLES DRAWN AEROBIC ONLY Blood Culture results may not be optimal due to an excessive volume of blood received in culture bottles   Culture   Final    NO GROWTH 1 DAY Performed at [facility], [place] [address] [place], [place] [address]    Report Status PENDING  Incomplete  Surgical pcr screen     Status: Abnormal   Collection Time: 05/19/22 11:36 AM   Specimen: Nasal Mucosa; Nasal Swab  Result Value Ref Range Status   MRSA, PCR NEGATIVE NEGATIVE Final   Staphylococcus aureus POSITIVE (A) NEGATIVE Final    Comment: (NOTE) The Xpert SA Assay (FDA approved for NASAL specimens in patients 42 years of age and older), is one component of a comprehensive surveillance program. It is not intended to diagnose infection nor to guide or monitor treatment. Performed at [facility], [place] [address], [place], [place] [address]       Radiology Studies: DG FEMUR MIN 2 VIEWS LEFT  Result Date: 05/19/2022 CLINICAL DATA:  Fluoroscopic assistance for internal fixation of fracture of distal left femur EXAM: LEFT FEMUR 2 VIEWS COMPARISON:  05/18/2022 FINDINGS: Fluoroscopic images show internal fixation of comminuted fracture of distal metaphyseal region of left femur. There is interval placement of intramedullary rod. There is improvement in alignment of fracture fragments. Fluoroscopic time was 1 minute and 43 seconds. Radiation dose was 12.75 mGy. IMPRESSION: Fluoroscopic assistance was provided for internal fixation of fracture of distal left femur. Electronically Signed   By: Elmer Picker M.D.   On: 05/19/2022 14:32   DG C-Arm 1-60 Min-No Report  Result Date: 05/19/2022 Fluoroscopy was utilized by the requesting physician.  No radiographic interpretation.     Scheduled Meds:  atorvastatin  40 mg Oral Daily   Chlorhexidine Gluconate Cloth  6 each Topical Q0600   citalopram  20 mg Oral Daily   enoxaparin  (LOVENOX) injection  40 mg Subcutaneous Q24H   ezetimibe  10 mg Oral Daily   gabapentin  300 mg Oral TID   insulin aspart  0-15 Units Subcutaneous TID WC   insulin aspart  0-5 Units Subcutaneous QHS   insulin glargine-yfgn  35 Units Subcutaneous Daily   metoprolol tartrate  12.5 mg Oral BID   mupirocin ointment  1 application  Nasal BID   pantoprazole  40 mg Oral Daily   Continuous Infusions:   ceFAZolin (ANCEF) IV 2 g (05/21/22 0504)  LOS: 2 days   Flora Lipps, MD [facility] Available via Epic secure chat 7am-7pm After these hours, please refer to coverage provider listed on [email or web address] 05/21/2022, 10:20 AM

## 2022-05-22 DIAGNOSIS — I10 Essential (primary) hypertension: Secondary | ICD-10-CM | POA: Diagnosis not present

## 2022-05-22 DIAGNOSIS — M79605 Pain in left leg: Secondary | ICD-10-CM | POA: Diagnosis not present

## 2022-05-22 DIAGNOSIS — S79929A Unspecified injury of unspecified thigh, initial encounter: Secondary | ICD-10-CM | POA: Diagnosis not present

## 2022-05-22 DIAGNOSIS — R42 Dizziness and giddiness: Secondary | ICD-10-CM | POA: Diagnosis not present

## 2022-05-22 DIAGNOSIS — L89156 Pressure-induced deep tissue damage of sacral region: Secondary | ICD-10-CM | POA: Diagnosis not present

## 2022-05-22 DIAGNOSIS — E46 Unspecified protein-calorie malnutrition: Secondary | ICD-10-CM | POA: Diagnosis not present

## 2022-05-22 DIAGNOSIS — I872 Venous insufficiency (chronic) (peripheral): Secondary | ICD-10-CM | POA: Diagnosis not present

## 2022-05-22 DIAGNOSIS — Z7409 Other reduced mobility: Secondary | ICD-10-CM | POA: Diagnosis not present

## 2022-05-22 DIAGNOSIS — N1831 Chronic kidney disease, stage 3a: Secondary | ICD-10-CM | POA: Diagnosis not present

## 2022-05-22 DIAGNOSIS — K1379 Other lesions of oral mucosa: Secondary | ICD-10-CM | POA: Diagnosis not present

## 2022-05-22 DIAGNOSIS — I890 Lymphedema, not elsewhere classified: Secondary | ICD-10-CM | POA: Diagnosis not present

## 2022-05-22 DIAGNOSIS — L89626 Pressure-induced deep tissue damage of left heel: Secondary | ICD-10-CM | POA: Diagnosis not present

## 2022-05-22 DIAGNOSIS — R600 Localized edema: Secondary | ICD-10-CM | POA: Diagnosis not present

## 2022-05-22 DIAGNOSIS — L89616 Pressure-induced deep tissue damage of right heel: Secondary | ICD-10-CM | POA: Diagnosis not present

## 2022-05-22 DIAGNOSIS — E162 Hypoglycemia, unspecified: Secondary | ICD-10-CM | POA: Diagnosis not present

## 2022-05-22 DIAGNOSIS — R197 Diarrhea, unspecified: Secondary | ICD-10-CM | POA: Diagnosis not present

## 2022-05-22 DIAGNOSIS — E11319 Type 2 diabetes mellitus with unspecified diabetic retinopathy without macular edema: Secondary | ICD-10-CM | POA: Diagnosis not present

## 2022-05-22 DIAGNOSIS — Z743 Need for continuous supervision: Secondary | ICD-10-CM | POA: Diagnosis not present

## 2022-05-22 DIAGNOSIS — E118 Type 2 diabetes mellitus with unspecified complications: Secondary | ICD-10-CM | POA: Diagnosis not present

## 2022-05-22 DIAGNOSIS — Z1159 Encounter for screening for other viral diseases: Secondary | ICD-10-CM | POA: Diagnosis not present

## 2022-05-22 DIAGNOSIS — Z952 Presence of prosthetic heart valve: Secondary | ICD-10-CM | POA: Diagnosis not present

## 2022-05-22 DIAGNOSIS — S72402D Unspecified fracture of lower end of left femur, subsequent encounter for closed fracture with routine healing: Secondary | ICD-10-CM | POA: Diagnosis not present

## 2022-05-22 DIAGNOSIS — W19XXXA Unspecified fall, initial encounter: Secondary | ICD-10-CM | POA: Diagnosis not present

## 2022-05-22 DIAGNOSIS — R0902 Hypoxemia: Secondary | ICD-10-CM | POA: Diagnosis not present

## 2022-05-22 DIAGNOSIS — M6281 Muscle weakness (generalized): Secondary | ICD-10-CM | POA: Diagnosis not present

## 2022-05-22 DIAGNOSIS — L89153 Pressure ulcer of sacral region, stage 3: Secondary | ICD-10-CM | POA: Diagnosis not present

## 2022-05-22 DIAGNOSIS — K219 Gastro-esophageal reflux disease without esophagitis: Secondary | ICD-10-CM | POA: Diagnosis not present

## 2022-05-22 DIAGNOSIS — L89150 Pressure ulcer of sacral region, unstageable: Secondary | ICD-10-CM | POA: Diagnosis not present

## 2022-05-22 DIAGNOSIS — R531 Weakness: Secondary | ICD-10-CM | POA: Diagnosis not present

## 2022-05-22 DIAGNOSIS — I5032 Chronic diastolic (congestive) heart failure: Secondary | ICD-10-CM | POA: Diagnosis not present

## 2022-05-22 DIAGNOSIS — R112 Nausea with vomiting, unspecified: Secondary | ICD-10-CM | POA: Diagnosis not present

## 2022-05-22 DIAGNOSIS — M25562 Pain in left knee: Secondary | ICD-10-CM | POA: Diagnosis not present

## 2022-05-22 DIAGNOSIS — E11649 Type 2 diabetes mellitus with hypoglycemia without coma: Secondary | ICD-10-CM | POA: Diagnosis not present

## 2022-05-22 DIAGNOSIS — R5381 Other malaise: Secondary | ICD-10-CM | POA: Diagnosis not present

## 2022-05-22 DIAGNOSIS — E1140 Type 2 diabetes mellitus with diabetic neuropathy, unspecified: Secondary | ICD-10-CM | POA: Diagnosis not present

## 2022-05-22 DIAGNOSIS — R5383 Other fatigue: Secondary | ICD-10-CM | POA: Diagnosis not present

## 2022-05-22 DIAGNOSIS — L03116 Cellulitis of left lower limb: Secondary | ICD-10-CM | POA: Diagnosis not present

## 2022-05-22 DIAGNOSIS — L89101 Pressure ulcer of unspecified part of back, stage 1: Secondary | ICD-10-CM | POA: Diagnosis not present

## 2022-05-22 DIAGNOSIS — E11621 Type 2 diabetes mellitus with foot ulcer: Secondary | ICD-10-CM | POA: Diagnosis not present

## 2022-05-22 DIAGNOSIS — S72402A Unspecified fracture of lower end of left femur, initial encounter for closed fracture: Secondary | ICD-10-CM | POA: Diagnosis not present

## 2022-05-22 DIAGNOSIS — L03115 Cellulitis of right lower limb: Secondary | ICD-10-CM | POA: Diagnosis not present

## 2022-05-22 DIAGNOSIS — S72352A Displaced comminuted fracture of shaft of left femur, initial encounter for closed fracture: Secondary | ICD-10-CM | POA: Diagnosis not present

## 2022-05-22 DIAGNOSIS — M25462 Effusion, left knee: Secondary | ICD-10-CM | POA: Diagnosis not present

## 2022-05-22 DIAGNOSIS — R350 Frequency of micturition: Secondary | ICD-10-CM | POA: Diagnosis not present

## 2022-05-22 DIAGNOSIS — D72829 Elevated white blood cell count, unspecified: Secondary | ICD-10-CM | POA: Diagnosis not present

## 2022-05-22 DIAGNOSIS — S72462D Displaced supracondylar fracture with intracondylar extension of lower end of left femur, subsequent encounter for closed fracture with routine healing: Secondary | ICD-10-CM | POA: Diagnosis not present

## 2022-05-22 DIAGNOSIS — S7292XA Unspecified fracture of left femur, initial encounter for closed fracture: Secondary | ICD-10-CM | POA: Diagnosis not present

## 2022-05-22 DIAGNOSIS — J45909 Unspecified asthma, uncomplicated: Secondary | ICD-10-CM | POA: Diagnosis not present

## 2022-05-22 DIAGNOSIS — E785 Hyperlipidemia, unspecified: Secondary | ICD-10-CM | POA: Diagnosis not present

## 2022-05-22 DIAGNOSIS — R52 Pain, unspecified: Secondary | ICD-10-CM | POA: Diagnosis not present

## 2022-05-22 DIAGNOSIS — E1169 Type 2 diabetes mellitus with other specified complication: Secondary | ICD-10-CM | POA: Diagnosis not present

## 2022-05-22 DIAGNOSIS — K5900 Constipation, unspecified: Secondary | ICD-10-CM | POA: Diagnosis not present

## 2022-05-22 DIAGNOSIS — M1712 Unilateral primary osteoarthritis, left knee: Secondary | ICD-10-CM | POA: Diagnosis not present

## 2022-05-22 DIAGNOSIS — L97511 Non-pressure chronic ulcer of other part of right foot limited to breakdown of skin: Secondary | ICD-10-CM | POA: Diagnosis not present

## 2022-05-22 DIAGNOSIS — Z79899 Other long term (current) drug therapy: Secondary | ICD-10-CM | POA: Diagnosis not present

## 2022-05-22 DIAGNOSIS — R031 Nonspecific low blood-pressure reading: Secondary | ICD-10-CM | POA: Diagnosis not present

## 2022-05-22 DIAGNOSIS — E1142 Type 2 diabetes mellitus with diabetic polyneuropathy: Secondary | ICD-10-CM | POA: Diagnosis not present

## 2022-05-22 LAB — GLUCOSE, CAPILLARY: Glucose-Capillary: 194 mg/dL — ABNORMAL HIGH (ref 70–99)

## 2022-05-22 LAB — BASIC METABOLIC PANEL
Anion gap: 5 (ref 5–15)
BUN: 22 mg/dL (ref 8–23)
CO2: 26 mmol/L (ref 22–32)
Calcium: 8 mg/dL — ABNORMAL LOW (ref 8.9–10.3)
Chloride: 101 mmol/L (ref 98–111)
Creatinine, Ser: 0.82 mg/dL (ref 0.44–1.00)
GFR, Estimated: >60 mL/min (ref >60)
Glucose, Bld: 216 mg/dL — ABNORMAL HIGH (ref 70–99)
Potassium: 4.7 mmol/L (ref 3.5–5.1)
Sodium: 132 mmol/L — ABNORMAL LOW (ref 135–145)

## 2022-05-22 MED ORDER — ACETAMINOPHEN 500 MG PO TABS: 500.0000 mg | ORAL_TABLET | Freq: Four times a day (QID) | ORAL | 0 refills | Status: AC | PRN

## 2022-05-22 MED ORDER — ASPIRIN 81 MG PO TABS: 81.0000 mg | ORAL_TABLET | Freq: Every day | ORAL | Status: DC

## 2022-05-22 MED ORDER — OXYCODONE HCL 5 MG PO TABS: 5.0000 mg | ORAL_TABLET | Freq: Four times a day (QID) | ORAL | 0 refills | Status: DC | PRN

## 2022-05-22 MED ORDER — CEPHALEXIN 500 MG PO CAPS: 500.0000 mg | ORAL_CAPSULE | Freq: Three times a day (TID) | ORAL | 0 refills | Status: AC

## 2022-05-22 MED ORDER — ENOXAPARIN SODIUM 40 MG/0.4ML IJ SOSY: 40.0000 mg | PREFILLED_SYRINGE | INTRAMUSCULAR | 0 refills | Status: DC

## 2022-05-22 MED ORDER — ASPIRIN 325 MG PO TBEC: 325.0000 mg | DELAYED_RELEASE_TABLET | Freq: Every day | ORAL | 0 refills | Status: AC

## 2022-05-22 NOTE — TOC Progression Note (Signed)
Transition of Care [facility]) - Progression Note    Patient Details  Name: [name] MRN: [id number] Date of Birth: 20-Apr-1946  Transition of Care [facility]) CM/SW Contact  Lorri Frederick, LCSW Phone Number: 05/22/2022, 8:35 AM  Clinical Narrative:   SNF auth approved: [id number], 3 days: 6/14-6/16.  MD informed.     Expected Discharge Plan: Skilled Nursing Facility Barriers to Discharge: Continued Medical Work up, SNF Pending bed offer  Expected Discharge Plan and Services Expected Discharge Plan: Skilled Nursing Facility In-house Referral: Clinical Social Work   Post Acute Care Choice: Skilled Nursing Facility Living arrangements for the past 2 months: Single Family Home                                       Social Determinants of Health (SDOH) Interventions    Readmission Risk Interventions     No data to display

## 2022-05-22 NOTE — Discharge Summary (Signed)
Physician Discharge Summary  [name] [id number] DOB: Sep 13, 1946 DOA: 05/18/2022  PCP: Sandrea Hughs, NP  Admit date: 05/18/2022 Discharge date: 05/22/2022  Admitted From: Home  Discharge disposition: Skilled nursing facility  Recommendations for Outpatient Follow-Up:   Follow up with your primary care provider at the skilled nursing facility in 3 to 5 days Check CBC, BMP, magnesium in the next visit Follow-up with Dr. Sammuel Hines orthopedics in 2 weeks for postop surgical follow-up 50% weightbearing with knee immobilizer LLE x2 weeks.  Patient has been prescribed aspirin full dose for next 25 days for DVT prophylaxis.  She will resume her low-dose aspirin after completion of full dose aspirin  Discharge Diagnosis:   Principal Problem:   Femur fracture ([place]) Active Problems:   Type 2 diabetes with complication (HCC)   Hyperlipidemia associated with type 2 diabetes mellitus ([place])   Essential hypertension   GERD   Diabetic neuropathy (HCC)   S/P TAVR (transcatheter aortic valve replacement)   Leucocytosis   Pressure injury of back, stage 1   Discharge Condition: Improved.  Diet recommendation:  Carbohydrate-modified.   Wound care: None.  Code status: Full.   History of Present Illness:   [name] is a 76 y.o. female with past medical history of aortic stenosis status post TAVR, type 2 diabetes, hyperlipidemia, CKD stage IIIa, hypertension, GERD, depression, chronic diastolic heart failure, peripheral neuropathy, chronic lower extremity lymphedema presented to the hospital after a fall.  She complains of left hip pain and could not weight-bear.  In the ED vitals were stable.  Sodium was 135.  X-ray of the left hip showed left distal femur fracture.  Orthopedics was consulted and patient was admitted hospital for further evaluation and treatment.   Hospital Course:   Following conditions were addressed during hospitalization as listed  below,  Left distal femoral supracondylar fracture Status post mechanical fall.  Orthopedics was consulted and patient underwent  left femoral retrograde IM nail by Dr. Sammuel Hines on 05/19/2022. 50% weightbearing with knee immobilizer LLE x2 weeks.  Communicated with Dr. Carma Leaven who recommended aspirin 325 mg daily for DVT prophylaxis on discharge.  This will be continued by low-dose aspirin.  Physical therapy recommended skilled nursing facility placement on discharge   Postoperative blood loss anemia, expected Hemoglobin at 8.9 from 9.4.  Has remained stable.   Bilateral Lower extremity cellulitis With underlying erythema, lymphedema, venous stasis.  Received IV cefazolin during hospitalization which will be changed to Keflex on discharge to complete 7-day total antibiotic course.  Mild leukocytosis and fever was present.  Blood cultures negative in 3 days.   Hypomagnesemia Improved after replacement.  Latest magnesium of 1.9.   Type 2 diabetes mellitus with hyperglycemia Hemoglobin A1c greater than 14.0 on 04/24/2022, poorly controlled.  Patient is on metformin and Tresiba and semaglutide at home.  This will be resumed on discharge  Essential hypertension Chronic diastolic congestive heart failure, compensated TTE 06/07/2021 with LVEF 60-65%, noted 23 mm SAPIEN prosthetic TAVR valve in the aortic position, grade 2 diastolic dysfunction, LA mildly dilated, IVC normal in size.  Patient is on metoprolol tartrate 12.5 mg p.o. twice daily, torsemide 20 mg p.o. daily at home will be resumed on discharge   hyperlipidemia Continue atorvastatin and Zetia.   Depression/anxiety:  Continue Celexa   GERD: Continue PPI   CKD stage IIIa Latest creatinine at 0.7.  At baseline.   Peripheral neuropathy Continue gabapentin  Pressure injury sacral area..  Stage I.  Will need pressure ulcer  prevention protocol. Pressure Injury 05/19/22 Sacrum Stage 1 -  Intact skin with non-blanchable redness of a  localized area usually over a bony prominence. (Active)  05/19/22 1600  Location: Sacrum  Location Orientation:   Staging: Stage 1 -  Intact skin with non-blanchable redness of a localized area usually over a bony prominence.  Wound Description (Comments):   Present on Admission: Yes   Disposition.  At this time, patient is stable for disposition to skilled nursing facility.  Communicated with the patient's son on 05/21/2022.  Medical Consultants:   Orthopedics  Procedures:    Left retrograde femoral nail, Dr. Sammuel Hines, 6/11 Subjective:   Today, patient was seen and examined at bedside.  She states that she slept okay.  Denies overt pain.  No nausea vomiting or abdominal pain.  Discharge Exam:   Vitals:   05/22/22 0433 05/22/22 0855  BP: (!) 131/57 133/61  Pulse: 89 98  Resp: 17 18  Temp: 98.9 F (37.2 C) 98.3 F (36.8 C)  SpO2: 96% (!) 84%   Vitals:   05/21/22 2031 05/22/22 0433 05/22/22 0700 05/22/22 0855  BP: (!) 152/56 (!) 131/57  133/61  Pulse:  89  98  Resp: 18 17  18   Temp: 100.2 F (37.9 C) 98.9 F (37.2 C)  98.3 F (36.8 C)  TempSrc: Oral Oral    SpO2:  96%  (!) 84%  Weight:   85.6 kg   Height:        General: Alert awake, not in obvious distress, obese built HENT: pupils equally reacting to light,  No scleral pallor or icterus noted. Oral mucosa is moist.  Chest:  Clear breath sounds.  Diminished breath sounds bilaterally. No crackles or wheezes.  CVS: S1 &S2 heard. No murmur.  Regular rate and rhythm. Abdomen: Soft, nontender, nondistended.  Bowel sounds are heard.   Extremities: No cyanosis, clubbing with bilateral lower extremity trace edema erythema.  Left knee in immobilizer in place, peripheral pulses are palpable. Psych: Alert, awake and oriented, normal mood CNS:  No cranial nerve deficits.  Power equal in all extremities.   Skin: Warm and dry.  Chronic changes over the bilateral lower extremities with mild erythema.  Stage I sacral pressure  injury  The results of significant diagnostics from this hospitalization (including imaging, microbiology, ancillary and laboratory) are listed below for reference.     Diagnostic Studies:   DG FEMUR MIN 2 VIEWS LEFT  Result Date: 05/19/2022 CLINICAL DATA:  Fluoroscopic assistance for internal fixation of fracture of distal left femur EXAM: LEFT FEMUR 2 VIEWS COMPARISON:  05/18/2022 FINDINGS: Fluoroscopic images show internal fixation of comminuted fracture of distal metaphyseal region of left femur. There is interval placement of intramedullary rod. There is improvement in alignment of fracture fragments. Fluoroscopic time was 1 minute and 43 seconds. Radiation dose was 12.75 mGy. IMPRESSION: Fluoroscopic assistance was provided for internal fixation of fracture of distal left femur. Electronically Signed   By: Elmer Picker M.D.   On: 05/19/2022 14:32   DG C-Arm 1-60 Min-No Report  Result Date: 05/19/2022 Fluoroscopy was utilized by the requesting physician.  No radiographic interpretation.   CT Knee Left Wo Contrast  Result Date: 05/19/2022 CLINICAL DATA:  Knee fracture. EXAM: CT OF THE LEFT KNEE WITHOUT CONTRAST TECHNIQUE: Multidetector CT imaging of the left knee was performed according to the standard protocol. Multiplanar CT image reconstructions were also generated. RADIATION DOSE REDUCTION: This exam was performed according to the departmental dose-optimization program which includes automated exposure  control, adjustment of the mA and/or kV according to patient size and/or use of iterative reconstruction technique. COMPARISON:  05/18/2022. FINDINGS: Bones/Joint/Cartilage There is a comminuted fracture of the supracondylar femur with intra-articular extension and mild posterior displacement of the distal fracture fragment. The remaining bony structures are intact. There are moderate degenerative changes in the patellofemoral and medial compartments. Mild degenerative changes are noted  in the lateral compartment. Enthesopathic changes are noted at the patella. There is a moderate joint effusion. Ligaments Suboptimally assessed by CT. Muscles and Tendons No intramuscular hematoma.  The visualized tendons appear intact. Soft tissues Diffuse subcutaneous edema about the knee. Vascular calcifications are noted in the soft soft tissues. IMPRESSION: 1. Comminuted fracture of the supracondylar distal femur with intra-articular extension and mild posterior displacement of the distal fracture fragment. 2. Moderate joint effusion. 3. Mild-to-moderate degenerative changes at the knee. Electronically Signed   By: Brett Fairy M.D.   On: 05/19/2022 02:08   CT CHEST ABDOMEN PELVIS W CONTRAST  Result Date: 05/19/2022 CLINICAL DATA:  Fall, hit back of head. EXAM: CT CHEST, ABDOMEN, AND PELVIS WITH CONTRAST TECHNIQUE: Multidetector CT imaging of the chest, abdomen and pelvis was performed following the standard protocol during bolus administration of intravenous contrast. RADIATION DOSE REDUCTION: This exam was performed according to the departmental dose-optimization program which includes automated exposure control, adjustment of the mA and/or kV according to patient size and/or use of iterative reconstruction technique. CONTRAST:  144mL OMNIPAQUE IOHEXOL 300 MG/ML  SOLN COMPARISON:  05/12/2020 FINDINGS: CT CHEST FINDINGS Cardiovascular: Heart is normal size. Aorta is normal caliber. Prior aortic valve repair. Coronary artery and aortic calcifications. Mediastinum/Nodes: No mediastinal, hilar, or axillary adenopathy. Trachea and esophagus are unremarkable. Thyroid unremarkable. Lungs/Pleura: Lungs are clear. No focal airspace opacities or suspicious nodules. No effusions. No pneumothorax Musculoskeletal: No acute bony abnormality. CT ABDOMEN PELVIS FINDINGS Hepatobiliary: Prior cholecystectomy. No focal hepatic abnormality or perihepatic hematoma. Pancreas: No focal abnormality or ductal dilatation. Spleen:  No splenic injury or perisplenic hematoma. Adrenals/Urinary Tract: No adrenal hemorrhage or renal injury identified. Bladder is unremarkable. Stomach/Bowel: Normal appendix. Stomach, large and small bowel grossly unremarkable. Vascular/Lymphatic: Aortic atherosclerosis. No evidence of aneurysm or adenopathy. Reproductive: Prior hysterectomy.  No adnexal masses. Other: No free fluid or free air.  Umbilical hernia containing fat. Musculoskeletal: No acute bony abnormality. Stable chronic L1 compression fracture. IMPRESSION: No acute findings or evidence of acute traumatic injury in the chest, abdomen or pelvis. Coronary artery disease, aortic atherosclerosis. Electronically Signed   By: Rolm Baptise M.D.   On: 05/19/2022 01:53   CT CERVICAL SPINE WO CONTRAST  Result Date: 05/19/2022 CLINICAL DATA:  Fall, hit EXAM: CT CERVICAL SPINE WITHOUT CONTRAST TECHNIQUE: Multidetector CT imaging of the cervical spine was performed without intravenous contrast. Multiplanar CT image reconstructions were also generated. RADIATION DOSE REDUCTION: This exam was performed according to the departmental dose-optimization program which includes automated exposure control, adjustment of the mA and/or kV according to patient size and/or use of iterative reconstruction technique. COMPARISON:  05/10/2020 FINDINGS: Alignment: Normal Skull base and vertebrae: No acute fracture. No primary bone lesion or focal pathologic process. Soft tissues and spinal canal: No prevertebral fluid or swelling. No visible canal hematoma. Disc levels:  Diffuse degenerative disc disease and facet disease. Upper chest: No acute findings Other: None IMPRESSION: Degenerative changes.  No acute bony abnormality. Electronically Signed   By: Rolm Baptise M.D.   On: 05/19/2022 01:48   CT HEAD WO CONTRAST  Result Date: 05/19/2022 CLINICAL  DATA:  Fall, hit back of head EXAM: CT HEAD WITHOUT CONTRAST TECHNIQUE: Contiguous axial images were obtained from the base of  the skull through the vertex without intravenous contrast. RADIATION DOSE REDUCTION: This exam was performed according to the departmental dose-optimization program which includes automated exposure control, adjustment of the mA and/or kV according to patient size and/or use of iterative reconstruction technique. COMPARISON:  05/10/2020 FINDINGS: Brain: No acute intracranial abnormality. Specifically, no hemorrhage, hydrocephalus, mass lesion, acute infarction, or significant intracranial injury. Vascular: No hyperdense vessel or unexpected calcification. Skull: No acute calvarial abnormality. Sinuses/Orbits: No acute findings Other: None IMPRESSION: No acute intracranial abnormality. Electronically Signed   By: Rolm Baptise M.D.   On: 05/19/2022 01:47   DG Hip Unilat With Pelvis 2-3 Views Left  Result Date: 05/18/2022 CLINICAL DATA:  Status post fall. EXAM: DG HIP (WITH OR WITHOUT PELVIS) 2-3V LEFT COMPARISON:  None Available. FINDINGS: There is no evidence of hip fracture or dislocation. Degenerative changes are seen in the form of joint space narrowing and acetabular sclerosis. IMPRESSION: No acute osseous abnormality. Electronically Signed   By: Virgina Norfolk M.D.   On: 05/18/2022 23:17   DG Knee Complete 4 Views Left  Result Date: 05/18/2022 CLINICAL DATA:  Status post fall. EXAM: LEFT KNEE - COMPLETE 4+ VIEW COMPARISON:  None Available. FINDINGS: An acute fracture deformity is seen extending through the supracondylar region of the distal left femur. Approximately 1/2 shaft width posterior displacement of the distal fracture site is seen. There is no evidence of dislocation. A large joint effusion is seen. IMPRESSION: Acute fracture of the distal left femur with an associated large joint effusion. Electronically Signed   By: Virgina Norfolk M.D.   On: 05/18/2022 23:16   DG Chest Port 1 View  Result Date: 05/18/2022 CLINICAL DATA:  Fall EXAM: PORTABLE CHEST 1 VIEW COMPARISON:  None Available.  FINDINGS: Prior aortic valve repair. Heart is upper limits normal in size. Aortic calcifications. Mediastinal contours within normal limits. No confluent opacities or effusions. No pneumothorax. No acute bony abnormality. IMPRESSION: No active disease. Electronically Signed   By: Rolm Baptise M.D.   On: 05/18/2022 23:15     Labs:   Basic Metabolic Panel: Recent Labs  Lab 05/18/22 2238 05/20/22 0149 05/21/22 0145 05/22/22 0205  NA 135 135 135 132*  K 4.4 4.0 4.4 4.7  CL 101 103 105 101  CO2 21* 28 26 26   GLUCOSE 355* 279* 200* 216*  BUN 21 23 24* 22  CREATININE 0.75 0.85 0.74 0.82  CALCIUM 9.0 8.2* 8.0* 8.0*  MG  --  1.6* 1.9  --    GFR Estimated Creatinine Clearance: 62.8 mL/min (by C-G formula based on SCr of 0.82 mg/dL). Liver Function Tests: Recent Labs  Lab 05/18/22 2238  AST 26  ALT 29  ALKPHOS 168*  BILITOT 0.3  PROT 6.7  ALBUMIN 2.8*   No results for input(s): "LIPASE", "AMYLASE" in the last 168 hours. No results for input(s): "AMMONIA" in the last 168 hours. Coagulation profile No results for input(s): "INR", "PROTIME" in the last 168 hours.  CBC: Recent Labs  Lab 05/19/22 0218 05/20/22 0149 05/21/22 0145  WBC 20.6* 10.3 13.0*  NEUTROABS 17.6*  --   --   HGB 13.1 9.4* 8.9*  HCT 39.0 29.7* 28.3*  MCV 84.2 86.1 87.3  PLT 202 138* 129*   Cardiac Enzymes: No results for input(s): "CKTOTAL", "CKMB", "CKMBINDEX", "TROPONINI" in the last 168 hours. BNP: Invalid input(s): "POCBNP" CBG: Recent  Labs  Lab 05/21/22 0840 05/21/22 1109 05/21/22 1552 05/21/22 2052 05/22/22 0748  GLUCAP 183* 169* 250* 220* 194*   D-Dimer No results for input(s): "DDIMER" in the last 72 hours. Hgb A1c No results for input(s): "HGBA1C" in the last 72 hours. Lipid Profile No results for input(s): "CHOL", "HDL", "LDLCALC", "TRIG", "CHOLHDL", "LDLDIRECT" in the last 72 hours. Thyroid function studies No results for input(s): "TSH", "T4TOTAL", "T3FREE", "THYROIDAB" in the  last 72 hours.  Invalid input(s): "FREET3" Anemia work up No results for input(s): "VITAMINB12", "FOLATE", "FERRITIN", "TIBC", "IRON", "RETICCTPCT" in the last 72 hours. Microbiology Recent Results (from the past 240 hour(s))  Blood culture (routine x 2)     Status: None (Preliminary result)   Collection Time: 05/19/22  5:00 AM   Specimen: BLOOD LEFT ARM  Result Value Ref Range Status   Specimen Description BLOOD LEFT ARM  Final   Special Requests   Final    BOTTLES DRAWN AEROBIC AND ANAEROBIC Blood Culture results may not be optimal due to an excessive volume of blood received in culture bottles   Culture   Final    NO GROWTH 3 DAYS Performed at [facility], [place] [address], [place], [place] [address]    Report Status PENDING  Incomplete  Blood culture (routine x 2)     Status: None (Preliminary result)   Collection Time: 05/19/22  5:00 AM   Specimen: BLOOD LEFT HAND  Result Value Ref Range Status   Specimen Description BLOOD LEFT HAND  Final   Special Requests   Final    BOTTLES DRAWN AEROBIC ONLY Blood Culture results may not be optimal due to an excessive volume of blood received in culture bottles   Culture   Final    NO GROWTH 3 DAYS Performed at [facility], [place] [address], [place], [place] [address]    Report Status PENDING  Incomplete  Surgical pcr screen     Status: Abnormal   Collection Time: 05/19/22 11:36 AM   Specimen: Nasal Mucosa; Nasal Swab  Result Value Ref Range Status   MRSA, PCR NEGATIVE NEGATIVE Final   Staphylococcus aureus POSITIVE (A) NEGATIVE Final    Comment: (NOTE) The Xpert SA Assay (FDA approved for NASAL specimens in patients 48 years of age and older), is one component of a comprehensive surveillance program. It is not intended to diagnose infection nor to guide or monitor treatment. Performed at [facility], [place] [address], [place], [place] [address]      Discharge Instructions:   Discharge Instructions     Call  MD for:  severe uncontrolled pain   Complete by: As directed    Call MD for:  temperature >100.4   Complete by: As directed    Diet - low sodium heart healthy   Complete by: As directed    Diet Carb Modified   Complete by: As directed    Discharge instructions   Complete by: As directed    Follow-up with your primary care provider at the skilled nursing facility in 3 to 5 days.  Check blood work at that time.  Follow-up with orthopedics Dr. Sammuel Hines for post surgical follow up.   Increase activity slowly   Complete by: As directed    No wound care   Complete by: As directed    Pressure ulcer prevention protocol.      Allergies as of 05/22/2022       Reactions   Morphine Other (See Comments)   '  took me out of this world' I had to be resuscitated   Codeine Sulfate Nausea Only, Other (See Comments)   GI upset and pain. "When to sleep and did not wake up".    Demerol [meperidine] Itching, Nausea And Vomiting, Swelling   Propoxyphene Hcl Other (See Comments)   Darvocet caused sick headache/swelling         Medication List     STOP taking these medications    furosemide 20 MG tablet Commonly known as: LASIX   NOVOLIN [place]   Potassium Chloride ER 20 MEQ Tbcr       TAKE these medications    acetaminophen 500 MG tablet Commonly known as: TYLENOL Take 1 tablet (500 mg total) by mouth every 6 (six) hours as needed. What changed:  how much to take when to take this reasons to take this   ASPERCREME LIDOCAINE EX Apply 1 application  topically as needed (pain).   aspirin EC 325 MG tablet Take 1 tablet (325 mg total) by mouth daily for 25 days. What changed: You were already taking a medication with the same name, and this prescription was added. Make sure you understand how and when to take each.   aspirin 81 MG tablet Take 1 tablet (81 mg total) by mouth daily. Start on July 14,2023 after completing full dose aspirin Start taking on: June 21, 2022 What changed:   additional instructions These instructions start on June 21, 2022. If you are unsure what to do until then, ask your doctor or other care provider.   atorvastatin 80 MG tablet Commonly known as: Lipitor Take 1 tablet (80 mg total) by mouth daily. What changed: Another medication with the same name was removed. Continue taking this medication, and follow the directions you see here.   cephALEXin 500 MG capsule Commonly known as: KEFLEX Take 1 capsule (500 mg total) by mouth 3 (three) times daily for 3 days.   citalopram 20 MG tablet Commonly known as: CELEXA Take 1 tablet (20 mg total) by mouth daily.   CLEAR EYES OP Place 1 drop into both eyes daily as needed (dry eyes/itching).   collagenase 250 UNIT/GM ointment Commonly known as: SANTYL Apply 1 application. topically 3 (three) times daily. Apply to sacrum   esomeprazole 20 MG capsule Commonly known as: NexIUM Take 1 capsule (20 mg total) by mouth daily at 12 noon.   ezetimibe 10 MG tablet Commonly known as: ZETIA TAKE 1 TABLET BY MOUTH  DAILY   FreeStyle Libre Sensor System Misc Use to monitor sugars   gabapentin 300 MG capsule Commonly known as: NEURONTIN TAKE 1 CAPSULE BY MOUTH 3  TIMES DAILY   Insulin Pen Needle 31G X 8 MM Misc Use to inject insulin four times daily E11.41   metFORMIN 1000 MG tablet Commonly known as: GLUCOPHAGE Take 500 mg by mouth 2 (two) times daily with a meal. What changed: Another medication with the same name was removed. Continue taking this medication, and follow the directions you see here.   metoprolol tartrate 25 MG tablet Commonly known as: LOPRESSOR Take 0.5 tablets (12.5 mg total) by mouth 2 (two) times daily.   nystatin powder Commonly known as: MYCOSTATIN/NYSTOP Apply 1 application. topically 3 (three) times daily. What changed: when to take this   OneTouch Verio test strip Generic drug: glucose blood USE AS DIRECTED   oxyCODONE 5 MG immediate release tablet Commonly  known as: Oxy IR/ROXICODONE Take 1 tablet (5 mg total) by mouth every 6 (six)  hours as needed for moderate pain or severe pain.   Rybelsus 14 MG Tabs Generic drug: Semaglutide Take 14 mg by mouth daily.   silver sulfADIAZINE 1 % cream Commonly known as: Silvadene Apply 1 application topically daily.   sodium chloride 0.65 % Soln nasal spray Commonly known as: OCEAN Place 1 spray into both nostrils 4 (four) times daily.   torsemide 20 MG tablet Commonly known as: DEMADEX Take 1 tablet (20 mg total) by mouth daily.   Tyler Aas FlexTouch 200 UNIT/ML FlexTouch Pen Generic drug: insulin degludec Inject 44 Units into the skin at bedtime.        Follow-up Information     Vanetta Mulders, MD. Schedule an appointment as soon as possible for a visit.   Specialty: Orthopedic Surgery Contact information: [address] [place] [address] [phone]                  Time coordinating discharge: 39 minutes  Signed:  Brynlee Pennywell  [facility] 05/22/2022, 9:27 AM

## 2022-05-22 NOTE — Progress Notes (Signed)
   Subjective:  Reports no pain this AM.  Continues to mobilize with physical therapy Objective:   VITALS:   Vitals:   05/21/22 1259 05/21/22 2031 05/22/22 0433 05/22/22 0700  BP: (!) 120/57 (!) 152/56 (!) 131/57   Pulse: 77  89   Resp: 18 18 17    Temp: 98.2 F (36.8 C) 100.2 F (37.9 C) 98.9 F (37.2 C)   TempSrc:  Oral Oral   SpO2:   96%   Weight:    85.6 kg  Height:        Left lower extremity dressings are clean dry and intact.  Knee immobilizer in place.  She fires tibialis anterior as well as EHL and gastrocsoleus.  Again difficult to palpate dorsalis pedis pulse although all of her toes are warm and well-perfused   Lab Results  Component Value Date   WBC 13.0 (H) 05/21/2022   HGB 8.9 (L) 05/21/2022   HCT 28.3 (L) 05/21/2022   MCV 87.3 05/21/2022   PLT 129 (L) 05/21/2022     Assessment/Plan:  3 Days Post-Op status post left femoral retrograde nail overall doing well  - Expected postop acute blood loss anemia - will monitor for symptoms - Patient to work with PT/OT to optimize mobilization safely - DVT ppx - SCDs, ambulation, Lovenox daily - PWB operative extremity: 50% weightbearing in knee immobilizer for 2 weeks - Pain control - multimodal pain management, ATC acetaminophen in conjunction with as needed narcotic (oxycodone), although this should be minimized with other modalities  - Discharge planning pending CM, appreciate coordination    [name] 05/22/2022, 8:20 AM

## 2022-05-22 NOTE — TOC Transition Note (Signed)
Transition of Care [facility]) - CM/SW Discharge Note   Patient Details  Name: Kahli Mayon MRN: [id number] Date of Birth: [birth year]  [facility] Care [facility]) CM/SW Contact:  Lorri Frederick, LCSW Phone Number: 05/22/2022, 9:49 AM   Clinical Narrative:   Pt discharging to [facility].  RN call [phone] for report.     Final next level of care: Skilled Nursing Facility Barriers to Discharge: Barriers Resolved   Patient Goals and CMS Choice   CMS Medicare.gov Compare Post Acute Care list provided to:: Patient Choice offered to / list presented to : Patient  Discharge Placement              Patient chooses bed at: [facility] Patient to be transferred to facility by: [facility] Name of family member notified: niece [name] Patient and family notified of of transfer: 05/22/22  Discharge Plan and Services In-house Referral: Clinical Social Work   Post Acute Care Choice: Skilled Nursing Facility                               Social Determinants of Health (SDOH) Interventions     Readmission Risk Interventions     No data to display

## 2022-05-23 ENCOUNTER — Telehealth: Payer: Self-pay

## 2022-05-23 DIAGNOSIS — M6281 Muscle weakness (generalized): Secondary | ICD-10-CM | POA: Diagnosis not present

## 2022-05-23 DIAGNOSIS — R350 Frequency of micturition: Secondary | ICD-10-CM | POA: Diagnosis not present

## 2022-05-23 DIAGNOSIS — L89150 Pressure ulcer of sacral region, unstageable: Secondary | ICD-10-CM | POA: Diagnosis not present

## 2022-05-23 DIAGNOSIS — L97511 Non-pressure chronic ulcer of other part of right foot limited to breakdown of skin: Secondary | ICD-10-CM | POA: Diagnosis not present

## 2022-05-23 DIAGNOSIS — I5032 Chronic diastolic (congestive) heart failure: Secondary | ICD-10-CM | POA: Diagnosis not present

## 2022-05-23 DIAGNOSIS — I890 Lymphedema, not elsewhere classified: Secondary | ICD-10-CM | POA: Diagnosis not present

## 2022-05-23 DIAGNOSIS — K219 Gastro-esophageal reflux disease without esophagitis: Secondary | ICD-10-CM | POA: Diagnosis not present

## 2022-05-23 DIAGNOSIS — S72402D Unspecified fracture of lower end of left femur, subsequent encounter for closed fracture with routine healing: Secondary | ICD-10-CM | POA: Diagnosis not present

## 2022-05-23 DIAGNOSIS — I10 Essential (primary) hypertension: Secondary | ICD-10-CM | POA: Diagnosis not present

## 2022-05-23 NOTE — Telephone Encounter (Signed)
Transition Care Management Unsuccessful Follow-up Telephone Call  Date of discharge and from where:  05/22/2022, [facility]   Attempts:  1st Attempt  Reason for unsuccessful TCM follow-up call:  Left voice message

## 2022-05-24 ENCOUNTER — Telehealth: Payer: Self-pay

## 2022-05-24 LAB — CULTURE, BLOOD (ROUTINE X 2)
Culture: NO GROWTH
Culture: NO GROWTH

## 2022-05-27 ENCOUNTER — Telehealth: Payer: Self-pay

## 2022-05-27 DIAGNOSIS — M6281 Muscle weakness (generalized): Secondary | ICD-10-CM | POA: Diagnosis not present

## 2022-05-27 DIAGNOSIS — R112 Nausea with vomiting, unspecified: Secondary | ICD-10-CM | POA: Diagnosis not present

## 2022-05-27 DIAGNOSIS — R5383 Other fatigue: Secondary | ICD-10-CM | POA: Diagnosis not present

## 2022-05-27 DIAGNOSIS — I890 Lymphedema, not elsewhere classified: Secondary | ICD-10-CM | POA: Diagnosis not present

## 2022-05-27 DIAGNOSIS — R5381 Other malaise: Secondary | ICD-10-CM | POA: Diagnosis not present

## 2022-05-27 DIAGNOSIS — S72402D Unspecified fracture of lower end of left femur, subsequent encounter for closed fracture with routine healing: Secondary | ICD-10-CM | POA: Diagnosis not present

## 2022-05-27 DIAGNOSIS — L97511 Non-pressure chronic ulcer of other part of right foot limited to breakdown of skin: Secondary | ICD-10-CM | POA: Diagnosis not present

## 2022-05-27 DIAGNOSIS — Z79899 Other long term (current) drug therapy: Secondary | ICD-10-CM | POA: Diagnosis not present

## 2022-05-27 DIAGNOSIS — L89150 Pressure ulcer of sacral region, unstageable: Secondary | ICD-10-CM | POA: Diagnosis not present

## 2022-05-27 NOTE — Telephone Encounter (Signed)
Transition Care Management Unsuccessful Follow-up Telephone Call  Date of discharge and from where:  05/22/2022  Attempts:  3rd Attempt  Reason for unsuccessful TCM follow-up call:  Unable to reach patient

## 2022-05-28 ENCOUNTER — Other Ambulatory Visit: Payer: Self-pay | Admitting: *Deleted

## 2022-05-28 DIAGNOSIS — R112 Nausea with vomiting, unspecified: Secondary | ICD-10-CM | POA: Diagnosis not present

## 2022-05-28 DIAGNOSIS — R5381 Other malaise: Secondary | ICD-10-CM | POA: Diagnosis not present

## 2022-05-28 DIAGNOSIS — D72829 Elevated white blood cell count, unspecified: Secondary | ICD-10-CM | POA: Diagnosis not present

## 2022-05-28 DIAGNOSIS — R5383 Other fatigue: Secondary | ICD-10-CM | POA: Diagnosis not present

## 2022-05-28 NOTE — Patient Outreach (Signed)
[facility]) Care Management  05/28/2022  [name] [birth year] [id number]   [facility] Unsuccessful outreach Outreach attempt to the listed at the preferred outreach number in EPIC [phone] No answer. [facility] RN CM left HIPAA [facility] Portability and Accountability Act) compliant voicemail message along with CM's contact info.      Plan: [facility] RN CM scheduled this patient for another call attempt within 4-7 business days Unsuccessful outreach on 05/28/22   Mackson Botz L. Noelle Penner, RN, BSN, CCM [facility] Telephonic Care Management Care Coordinator Office number [phone]

## 2022-05-29 ENCOUNTER — Other Ambulatory Visit: Payer: Self-pay | Admitting: *Deleted

## 2022-05-29 DIAGNOSIS — I10 Essential (primary) hypertension: Secondary | ICD-10-CM | POA: Diagnosis not present

## 2022-05-29 DIAGNOSIS — R031 Nonspecific low blood-pressure reading: Secondary | ICD-10-CM | POA: Diagnosis not present

## 2022-05-29 DIAGNOSIS — S72402D Unspecified fracture of lower end of left femur, subsequent encounter for closed fracture with routine healing: Secondary | ICD-10-CM | POA: Diagnosis not present

## 2022-05-29 DIAGNOSIS — R5381 Other malaise: Secondary | ICD-10-CM | POA: Diagnosis not present

## 2022-05-29 DIAGNOSIS — K1379 Other lesions of oral mucosa: Secondary | ICD-10-CM | POA: Diagnosis not present

## 2022-05-29 DIAGNOSIS — R5383 Other fatigue: Secondary | ICD-10-CM | POA: Diagnosis not present

## 2022-05-29 DIAGNOSIS — M79605 Pain in left leg: Secondary | ICD-10-CM | POA: Diagnosis not present

## 2022-05-29 DIAGNOSIS — E11649 Type 2 diabetes mellitus with hypoglycemia without coma: Secondary | ICD-10-CM | POA: Diagnosis not present

## 2022-05-29 DIAGNOSIS — R42 Dizziness and giddiness: Secondary | ICD-10-CM | POA: Diagnosis not present

## 2022-05-29 NOTE — Patient Outreach (Incomplete)

## 2022-05-30 ENCOUNTER — Other Ambulatory Visit: Payer: Self-pay | Admitting: *Deleted

## 2022-05-30 DIAGNOSIS — R5381 Other malaise: Secondary | ICD-10-CM | POA: Diagnosis not present

## 2022-05-30 DIAGNOSIS — M6281 Muscle weakness (generalized): Secondary | ICD-10-CM | POA: Diagnosis not present

## 2022-05-30 DIAGNOSIS — R112 Nausea with vomiting, unspecified: Secondary | ICD-10-CM | POA: Diagnosis not present

## 2022-05-30 DIAGNOSIS — I10 Essential (primary) hypertension: Secondary | ICD-10-CM | POA: Diagnosis not present

## 2022-05-30 DIAGNOSIS — K219 Gastro-esophageal reflux disease without esophagitis: Secondary | ICD-10-CM | POA: Diagnosis not present

## 2022-05-30 DIAGNOSIS — L03116 Cellulitis of left lower limb: Secondary | ICD-10-CM | POA: Diagnosis not present

## 2022-05-30 DIAGNOSIS — R350 Frequency of micturition: Secondary | ICD-10-CM | POA: Diagnosis not present

## 2022-05-30 DIAGNOSIS — S72402D Unspecified fracture of lower end of left femur, subsequent encounter for closed fracture with routine healing: Secondary | ICD-10-CM | POA: Diagnosis not present

## 2022-05-30 DIAGNOSIS — L03115 Cellulitis of right lower limb: Secondary | ICD-10-CM | POA: Diagnosis not present

## 2022-05-30 DIAGNOSIS — I890 Lymphedema, not elsewhere classified: Secondary | ICD-10-CM | POA: Diagnosis not present

## 2022-05-30 DIAGNOSIS — I5032 Chronic diastolic (congestive) heart failure: Secondary | ICD-10-CM | POA: Diagnosis not present

## 2022-05-30 NOTE — Patient Outreach (Addendum)
[name]) Care Management  05/30/2022  [name] [birth year] [id number]   [facility] Case closure - in skilled nursing facility   Call attempts made on 05/28/22 and an update from  [name], [facility] RN CM Post acute coordinator of patient admission to [place] [facility] indicates an admission on 05/22/22 after an admission (6/10-14/23) to [place] [facility] after a fall resulting in a left distal displaced supracondylar (femur) fracture with intracondylar extension (Dr Sammuel Hines)  50% weightbearing with knee immobilizer LLE x 2 weeks. Stage 1 sacral pressure ulcer  Care Plan : RN Care Manager Plan of Care  Updates made by Cynthia Faster, RN since 05/30/2022 12:00 AM  Completed 05/30/2022   Problem: Complex Care Coordination Needs and disease management in patient with diabetes, SDOH needs Resolved 05/30/2022  Priority: High  Onset Date: 05/03/2022     Long-Range Goal: Establish Plan of Care for Management Complex SDOH Barriers, disease management and Care Coordination Needs in patient with diabetes, sdoh needs Completed 05/30/2022  Start Date: 05/03/2022  This Visit's Progress: Not on track  Recent Progress: Not on track  Priority: High  Note:   Current Barriers:  Knowledge Deficits related to plan of care for management of DMII  Care Coordination needs related to Limited social support, Transportation, Limited education about community resources*, and Lacks knowledge of community resource: transportation, food insecurity Lacks caregiver support Transportation barriers Barriers Falls, Poor understanding of how to utilize her only phone source, requesting frequent calls when she does not answer. 05/07/22 Unsuccessful outreaches x 3 calls to voice message left pt 2 messages and her niece [name] a voice message x 1 -finally with successful outreach after welfare check completed Outreach to pt successful. She needed re orientation to the return  call she placed to RN CM on 05/03/22. She stated a few times that "I don't remember that".  She confirms some memory issues plus stated "You know everybody thinks I am crazy" Encouragement and empathy provided 05/14/22 She is not able to apply the [place] sensor as she does not have the strength to unscrew the applicator as evidence by no success during this outreach after 3 attempts 05/28/22 unsuccessful outreach 05/30/22 [facility] PAC staff [place], informed of 05/22/22 admission to [place] facility after a fall at home and hospital admission to [facility] 6/10-14/23 for left hip fracture.  Case closure- Pt to be referred to new [facility] RN CM depending on discharge from facility   RN CM Clinical Goal(s):  Patient will verbalize understanding of plan for management of DMII as evidenced by improved HgA1c value and verbalization of access, usage of community resources and possible change of level of care location  through collaboration with RN Care manager, provider, and care team.   Interventions: Outreaches for care coordination, disease management, resources, home care/education needs Inter-disciplinary care team collaboration (see longitudinal plan of care) Evaluation of current treatment plan related to  self management and patient's adherence to plan as established by provider 05/07/22 Review of pcp ambulatory home health order to confirm [facility] is the chosen agency for care Spoke with [facility] [place] [phone] confirm pt did have a home health nurse visit her on Saturday 05/04/22 and is ordered for [facility] PT and SW services that is pending scheduling, therefore RN CM will not need to refer to [facility]  05/07/22 Outreach to 911 non emergency area and spoke with [place] #  [phone] to file a welfare check report for [name] after [id number] and RN CM agreed to do so    1424 Incoming call from Officer [name] to confirm [name] is doing fine.  He reports he found her walking in her driveway with her walker He reports she had a lot of items with her and not sure if she has her phone or medical alert bracelet with her. He informed RN CM that [name] informed him she did not know RN CM nor did she recall that she had a scheduled follow outreach with RN CM today   Interdisciplinary Collaboration Interventions:  (Status: Goal on track:  NO.) Short Term Goal  Update  05/30/22 case closure in SNF 05/2022 unsuccessful outreach 05/14/22 Still with limited social support, She confirms she has Access [place] forms she has to complete Still preference is assistance from neighbors She is reluctant use [facility] [facility]) transportation benefit after 2 experiences related to missing appointments and incontinence while waiting for the transportation to arrive She not preferring meals supplied by meals on wheels or moms meals Collaborated with RN CM , Care guide to initiate plan of care to address needs related to Limited social support, Transportation, Limited access to food, Level of care concerns, and Lacks knowledge of community resource: reported lack of community SDOH resources  in patient with DMII Collaboration with [name], Cynthia C, NP  05/07/22 Discuss Utilization of her local senior center and/or [employer] for education on use of her phone and other electronics. She is aware of the location of the library but not the senior center  05/14/22 Reminded pt of the availability to use united healthcare medicare transportation benefit. Corporate investment banker with information on how to get assist from post office and garbage staff for hardship status   Diabetes Interventions:  (Status:  Goal on track:  NO.) Long Term Goal Update  05/30/22 case closure in SNF 05/2022 unsuccessful outreach 05/14/22 Diagnosis with DM in [place]. She is not certain of her recent HgA1c value. She confirms she is not using her [facility] as she has not been able to  apply her sensor. She states she inquired of assistance to get it applied from the home health nurse but was informed the nurse did have an order 05/07/22 Pt reported she woke up late today and was just eating breakfast at 1430. She states she does not know what her cbg value as she did not check it today. She reports she does not know where her cbg DME is when RN CM inquired. She confirms fatigue/sleepiness, neuropathic pain of her feet and poor eye sight. She denies thirst, headache nor dizziness  Assessed patient's understanding of A1c goal: <7% Provided education to patient about basic DM disease process Reviewed medications with patient and discussed importance of medication adherence Counseled on importance of regular laboratory monitoring as prescribed Discussed plans with patient for ongoing care management follow up and provided patient with direct contact information for care management team Review of patient status, including review of consultants reports, relevant laboratory and other test results, and medications completed Screening for signs and symptoms of depression related to chronic disease state  Assessed social determinant of health barriers 05/14/22 Diabetes assessment started but not completed as pt does not have Libre sensor intact-Reviewed A1cs, target A1c, Risks Guided patient through the steps of applying her Freestyle [place] 2 sensor but she was unsuccessful with Psychologist, counselling with information on free style [employer]  2 quick start guide Outreach to pcp [phone] left a voice message at prompt #3 about freestyle sensor not being worn/barrier with pt applying it/ request [facility] RN order, for application or for it to be applied during next office visit, pt incontinence, leg edema ? Specialist/wound care services need Lab Results  Component Value Date   HGBA1C >14.0 (H) 04/24/2022      11/19/21      06/12/21 03/14/21  02/22/21   03/20/20 08/29/20  05/31/19 Hemoglobin  A1C 4.0 - 5.6 % 9.5 Abnormal   9.8 High  R, CM  12.6 High  R, CM  11.6 Abnormal   11.2 Abnormal   12.1 Abnormal   10.3 Abnormal   Patient Goals/Self-Care Activities: Take all medications as prescribed Attend all scheduled provider appointments Perform all self care activities independently  Call provider office for new concerns or questions   Follow Up Plan:  No further follow up required: 05/30/22 case closure in SNF        Plan [facility] RN CM will close case  Case closure letters sent to patient and MD Pt to be referred to [facility] prn pending discharge date from skilled nursing facility  Ladora L. Noelle Penner, RN, BSN, CCM [facility] Telephonic Care Management Care Coordinator Office number [phone] [facility] number [phone] Fax number [phone]

## 2022-05-31 ENCOUNTER — Telehealth: Payer: Medicare Other

## 2022-05-31 DIAGNOSIS — R112 Nausea with vomiting, unspecified: Secondary | ICD-10-CM | POA: Diagnosis not present

## 2022-05-31 DIAGNOSIS — M79605 Pain in left leg: Secondary | ICD-10-CM | POA: Diagnosis not present

## 2022-05-31 DIAGNOSIS — R5381 Other malaise: Secondary | ICD-10-CM | POA: Diagnosis not present

## 2022-05-31 DIAGNOSIS — R031 Nonspecific low blood-pressure reading: Secondary | ICD-10-CM | POA: Diagnosis not present

## 2022-05-31 DIAGNOSIS — S72402D Unspecified fracture of lower end of left femur, subsequent encounter for closed fracture with routine healing: Secondary | ICD-10-CM | POA: Diagnosis not present

## 2022-05-31 DIAGNOSIS — R5383 Other fatigue: Secondary | ICD-10-CM | POA: Diagnosis not present

## 2022-06-03 ENCOUNTER — Ambulatory Visit (INDEPENDENT_AMBULATORY_CARE_PROVIDER_SITE_OTHER): Payer: Medicare Other

## 2022-06-03 ENCOUNTER — Other Ambulatory Visit: Payer: Self-pay | Admitting: Physician Assistant

## 2022-06-03 ENCOUNTER — Ambulatory Visit (INDEPENDENT_AMBULATORY_CARE_PROVIDER_SITE_OTHER): Payer: Medicare Other | Admitting: Orthopaedic Surgery

## 2022-06-03 DIAGNOSIS — M79605 Pain in left leg: Secondary | ICD-10-CM | POA: Diagnosis not present

## 2022-06-03 DIAGNOSIS — S7292XA Unspecified fracture of left femur, initial encounter for closed fracture: Secondary | ICD-10-CM

## 2022-06-03 DIAGNOSIS — Z952 Presence of prosthetic heart valve: Secondary | ICD-10-CM

## 2022-06-03 DIAGNOSIS — L89150 Pressure ulcer of sacral region, unstageable: Secondary | ICD-10-CM | POA: Diagnosis not present

## 2022-06-03 DIAGNOSIS — E46 Unspecified protein-calorie malnutrition: Secondary | ICD-10-CM | POA: Diagnosis not present

## 2022-06-03 DIAGNOSIS — S72402D Unspecified fracture of lower end of left femur, subsequent encounter for closed fracture with routine healing: Secondary | ICD-10-CM | POA: Diagnosis not present

## 2022-06-03 DIAGNOSIS — S72352A Displaced comminuted fracture of shaft of left femur, initial encounter for closed fracture: Secondary | ICD-10-CM | POA: Diagnosis not present

## 2022-06-03 DIAGNOSIS — K5900 Constipation, unspecified: Secondary | ICD-10-CM | POA: Diagnosis not present

## 2022-06-03 DIAGNOSIS — L97511 Non-pressure chronic ulcer of other part of right foot limited to breakdown of skin: Secondary | ICD-10-CM | POA: Diagnosis not present

## 2022-06-03 DIAGNOSIS — E1140 Type 2 diabetes mellitus with diabetic neuropathy, unspecified: Secondary | ICD-10-CM | POA: Diagnosis not present

## 2022-06-03 DIAGNOSIS — R0902 Hypoxemia: Secondary | ICD-10-CM | POA: Diagnosis not present

## 2022-06-04 ENCOUNTER — Ambulatory Visit: Payer: Self-pay | Admitting: *Deleted

## 2022-06-04 DIAGNOSIS — M79605 Pain in left leg: Secondary | ICD-10-CM | POA: Diagnosis not present

## 2022-06-04 DIAGNOSIS — S72402D Unspecified fracture of lower end of left femur, subsequent encounter for closed fracture with routine healing: Secondary | ICD-10-CM | POA: Diagnosis not present

## 2022-06-04 NOTE — Telephone Encounter (Signed)
Transition Care Management Unsuccessful Follow-up Telephone Call  Date of discharge and from where:  05/22/2022, [facility]  Attempts:  2nd Attempt  Reason for unsuccessful TCM follow-up call:  Unable to reach patient, this is a late entry to close chart. MLP

## 2022-06-05 ENCOUNTER — Other Ambulatory Visit: Payer: Self-pay | Admitting: *Deleted

## 2022-06-05 NOTE — Patient Outreach (Signed)

## 2022-06-12 DIAGNOSIS — M79605 Pain in left leg: Secondary | ICD-10-CM | POA: Diagnosis not present

## 2022-06-12 DIAGNOSIS — S72402D Unspecified fracture of lower end of left femur, subsequent encounter for closed fracture with routine healing: Secondary | ICD-10-CM | POA: Diagnosis not present

## 2022-06-12 DIAGNOSIS — R5381 Other malaise: Secondary | ICD-10-CM | POA: Diagnosis not present

## 2022-06-12 DIAGNOSIS — M6281 Muscle weakness (generalized): Secondary | ICD-10-CM | POA: Diagnosis not present

## 2022-06-12 DIAGNOSIS — R5383 Other fatigue: Secondary | ICD-10-CM | POA: Diagnosis not present

## 2022-06-12 DIAGNOSIS — I890 Lymphedema, not elsewhere classified: Secondary | ICD-10-CM | POA: Diagnosis not present

## 2022-06-13 DIAGNOSIS — E1140 Type 2 diabetes mellitus with diabetic neuropathy, unspecified: Secondary | ICD-10-CM | POA: Diagnosis not present

## 2022-06-13 DIAGNOSIS — L89153 Pressure ulcer of sacral region, stage 3: Secondary | ICD-10-CM | POA: Diagnosis not present

## 2022-06-13 DIAGNOSIS — L89616 Pressure-induced deep tissue damage of right heel: Secondary | ICD-10-CM | POA: Diagnosis not present

## 2022-06-13 DIAGNOSIS — L89626 Pressure-induced deep tissue damage of left heel: Secondary | ICD-10-CM | POA: Diagnosis not present

## 2022-06-17 DIAGNOSIS — E162 Hypoglycemia, unspecified: Secondary | ICD-10-CM | POA: Diagnosis not present

## 2022-06-17 DIAGNOSIS — E1140 Type 2 diabetes mellitus with diabetic neuropathy, unspecified: Secondary | ICD-10-CM | POA: Diagnosis not present

## 2022-06-18 DIAGNOSIS — E1140 Type 2 diabetes mellitus with diabetic neuropathy, unspecified: Secondary | ICD-10-CM | POA: Diagnosis not present

## 2022-06-18 DIAGNOSIS — L89616 Pressure-induced deep tissue damage of right heel: Secondary | ICD-10-CM | POA: Diagnosis not present

## 2022-06-18 DIAGNOSIS — L89626 Pressure-induced deep tissue damage of left heel: Secondary | ICD-10-CM | POA: Diagnosis not present

## 2022-06-18 DIAGNOSIS — L89153 Pressure ulcer of sacral region, stage 3: Secondary | ICD-10-CM | POA: Diagnosis not present

## 2022-06-19 ENCOUNTER — Other Ambulatory Visit: Payer: Self-pay | Admitting: *Deleted

## 2022-06-19 DIAGNOSIS — M25562 Pain in left knee: Secondary | ICD-10-CM | POA: Diagnosis not present

## 2022-06-19 DIAGNOSIS — R600 Localized edema: Secondary | ICD-10-CM | POA: Diagnosis not present

## 2022-06-19 DIAGNOSIS — Z7409 Other reduced mobility: Secondary | ICD-10-CM | POA: Diagnosis not present

## 2022-06-19 NOTE — Patient Outreach (Signed)

## 2022-06-21 DIAGNOSIS — M25462 Effusion, left knee: Secondary | ICD-10-CM | POA: Diagnosis not present

## 2022-06-21 DIAGNOSIS — M25562 Pain in left knee: Secondary | ICD-10-CM | POA: Diagnosis not present

## 2022-06-21 DIAGNOSIS — Z7409 Other reduced mobility: Secondary | ICD-10-CM | POA: Diagnosis not present

## 2022-06-21 DIAGNOSIS — R600 Localized edema: Secondary | ICD-10-CM | POA: Diagnosis not present

## 2022-06-24 DIAGNOSIS — Z7409 Other reduced mobility: Secondary | ICD-10-CM | POA: Diagnosis not present

## 2022-06-24 DIAGNOSIS — L97511 Non-pressure chronic ulcer of other part of right foot limited to breakdown of skin: Secondary | ICD-10-CM | POA: Diagnosis not present

## 2022-06-24 DIAGNOSIS — E1140 Type 2 diabetes mellitus with diabetic neuropathy, unspecified: Secondary | ICD-10-CM | POA: Diagnosis not present

## 2022-06-24 DIAGNOSIS — M25462 Effusion, left knee: Secondary | ICD-10-CM | POA: Diagnosis not present

## 2022-06-24 DIAGNOSIS — R600 Localized edema: Secondary | ICD-10-CM | POA: Diagnosis not present

## 2022-06-24 DIAGNOSIS — E46 Unspecified protein-calorie malnutrition: Secondary | ICD-10-CM | POA: Diagnosis not present

## 2022-06-24 DIAGNOSIS — L89150 Pressure ulcer of sacral region, unstageable: Secondary | ICD-10-CM | POA: Diagnosis not present

## 2022-06-24 DIAGNOSIS — M25562 Pain in left knee: Secondary | ICD-10-CM | POA: Diagnosis not present

## 2022-06-26 DIAGNOSIS — R197 Diarrhea, unspecified: Secondary | ICD-10-CM | POA: Diagnosis not present

## 2022-06-27 DIAGNOSIS — R600 Localized edema: Secondary | ICD-10-CM | POA: Diagnosis not present

## 2022-06-27 DIAGNOSIS — M25462 Effusion, left knee: Secondary | ICD-10-CM | POA: Diagnosis not present

## 2022-06-27 DIAGNOSIS — M25562 Pain in left knee: Secondary | ICD-10-CM | POA: Diagnosis not present

## 2022-06-27 DIAGNOSIS — S72402D Unspecified fracture of lower end of left femur, subsequent encounter for closed fracture with routine healing: Secondary | ICD-10-CM | POA: Diagnosis not present

## 2022-06-28 ENCOUNTER — Other Ambulatory Visit: Payer: Self-pay | Admitting: Family

## 2022-06-28 DIAGNOSIS — E1122 Type 2 diabetes mellitus with diabetic chronic kidney disease: Secondary | ICD-10-CM

## 2022-07-01 ENCOUNTER — Ambulatory Visit (INDEPENDENT_AMBULATORY_CARE_PROVIDER_SITE_OTHER): Payer: Medicare Other | Admitting: Orthopaedic Surgery

## 2022-07-01 ENCOUNTER — Ambulatory Visit (INDEPENDENT_AMBULATORY_CARE_PROVIDER_SITE_OTHER): Payer: Medicare Other

## 2022-07-01 DIAGNOSIS — S7292XA Unspecified fracture of left femur, initial encounter for closed fracture: Secondary | ICD-10-CM

## 2022-07-01 DIAGNOSIS — S72402A Unspecified fracture of lower end of left femur, initial encounter for closed fracture: Secondary | ICD-10-CM | POA: Diagnosis not present
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
## 2022-07-02 DIAGNOSIS — E46 Unspecified protein-calorie malnutrition: Secondary | ICD-10-CM | POA: Diagnosis not present

## 2022-07-02 DIAGNOSIS — I10 Essential (primary) hypertension: Secondary | ICD-10-CM | POA: Diagnosis not present

## 2022-07-02 DIAGNOSIS — I5032 Chronic diastolic (congestive) heart failure: Secondary | ICD-10-CM | POA: Diagnosis not present

## 2022-07-02 DIAGNOSIS — E1140 Type 2 diabetes mellitus with diabetic neuropathy, unspecified: Secondary | ICD-10-CM | POA: Diagnosis not present

## 2022-07-03 ENCOUNTER — Other Ambulatory Visit: Payer: Self-pay | Admitting: *Deleted

## 2022-07-03 NOTE — Patient Outreach (Signed)

## 2022-07-04 DIAGNOSIS — K219 Gastro-esophageal reflux disease without esophagitis: Secondary | ICD-10-CM | POA: Diagnosis not present

## 2022-07-04 DIAGNOSIS — M6281 Muscle weakness (generalized): Secondary | ICD-10-CM | POA: Diagnosis not present

## 2022-07-04 DIAGNOSIS — I10 Essential (primary) hypertension: Secondary | ICD-10-CM | POA: Diagnosis not present

## 2022-07-04 DIAGNOSIS — I5032 Chronic diastolic (congestive) heart failure: Secondary | ICD-10-CM | POA: Diagnosis not present

## 2022-07-04 DIAGNOSIS — E1140 Type 2 diabetes mellitus with diabetic neuropathy, unspecified: Secondary | ICD-10-CM | POA: Diagnosis not present

## 2022-07-04 DIAGNOSIS — I890 Lymphedema, not elsewhere classified: Secondary | ICD-10-CM | POA: Diagnosis not present

## 2022-07-04 DIAGNOSIS — E11649 Type 2 diabetes mellitus with hypoglycemia without coma: Secondary | ICD-10-CM | POA: Diagnosis not present

## 2022-07-04 DIAGNOSIS — S72402D Unspecified fracture of lower end of left femur, subsequent encounter for closed fracture with routine healing: Secondary | ICD-10-CM | POA: Diagnosis not present

## 2022-07-04 DIAGNOSIS — M79605 Pain in left leg: Secondary | ICD-10-CM | POA: Diagnosis not present

## 2022-07-04 DIAGNOSIS — M25562 Pain in left knee: Secondary | ICD-10-CM | POA: Diagnosis not present

## 2022-07-05 DIAGNOSIS — S72462D Displaced supracondylar fracture with intracondylar extension of lower end of left femur, subsequent encounter for closed fracture with routine healing: Secondary | ICD-10-CM | POA: Diagnosis not present

## 2022-07-05 DIAGNOSIS — L89620 Pressure ulcer of left heel, unstageable: Secondary | ICD-10-CM | POA: Diagnosis not present

## 2022-07-05 DIAGNOSIS — M6281 Muscle weakness (generalized): Secondary | ICD-10-CM | POA: Diagnosis not present

## 2022-07-06 DIAGNOSIS — E119 Type 2 diabetes mellitus without complications: Secondary | ICD-10-CM | POA: Diagnosis not present

## 2022-07-06 DIAGNOSIS — E559 Vitamin D deficiency, unspecified: Secondary | ICD-10-CM | POA: Diagnosis not present

## 2022-07-06 DIAGNOSIS — D649 Anemia, unspecified: Secondary | ICD-10-CM | POA: Diagnosis not present

## 2022-07-06 DIAGNOSIS — L89159 Pressure ulcer of sacral region, unspecified stage: Secondary | ICD-10-CM | POA: Diagnosis not present

## 2022-07-06 DIAGNOSIS — S7290XA Unspecified fracture of unspecified femur, initial encounter for closed fracture: Secondary | ICD-10-CM | POA: Diagnosis not present

## 2022-07-06 DIAGNOSIS — Z79899 Other long term (current) drug therapy: Secondary | ICD-10-CM | POA: Diagnosis not present

## 2022-07-06 DIAGNOSIS — Z952 Presence of prosthetic heart valve: Secondary | ICD-10-CM | POA: Diagnosis not present

## 2022-07-06 DIAGNOSIS — E11621 Type 2 diabetes mellitus with foot ulcer: Secondary | ICD-10-CM | POA: Diagnosis not present

## 2022-07-09 DIAGNOSIS — I5032 Chronic diastolic (congestive) heart failure: Secondary | ICD-10-CM | POA: Diagnosis not present

## 2022-07-09 DIAGNOSIS — R2681 Unsteadiness on feet: Secondary | ICD-10-CM | POA: Diagnosis not present

## 2022-07-09 DIAGNOSIS — R279 Unspecified lack of coordination: Secondary | ICD-10-CM | POA: Diagnosis not present

## 2022-07-09 DIAGNOSIS — Z9181 History of falling: Secondary | ICD-10-CM | POA: Diagnosis not present

## 2022-07-09 DIAGNOSIS — S72462D Displaced supracondylar fracture with intracondylar extension of lower end of left femur, subsequent encounter for closed fracture with routine healing: Secondary | ICD-10-CM | POA: Diagnosis not present

## 2022-07-09 DIAGNOSIS — N1831 Chronic kidney disease, stage 3a: Secondary | ICD-10-CM | POA: Diagnosis not present

## 2022-07-09 DIAGNOSIS — R278 Other lack of coordination: Secondary | ICD-10-CM | POA: Diagnosis not present

## 2022-07-09 DIAGNOSIS — M6281 Muscle weakness (generalized): Secondary | ICD-10-CM | POA: Diagnosis not present

## 2022-07-09 DIAGNOSIS — Z4789 Encounter for other orthopedic aftercare: Secondary | ICD-10-CM | POA: Diagnosis not present

## 2022-07-10 ENCOUNTER — Other Ambulatory Visit: Payer: Self-pay | Admitting: *Deleted

## 2022-07-10 DIAGNOSIS — L89623 Pressure ulcer of left heel, stage 3: Secondary | ICD-10-CM | POA: Diagnosis not present

## 2022-07-10 DIAGNOSIS — Z4789 Encounter for other orthopedic aftercare: Secondary | ICD-10-CM | POA: Diagnosis not present

## 2022-07-10 DIAGNOSIS — Z9181 History of falling: Secondary | ICD-10-CM | POA: Diagnosis not present

## 2022-07-10 DIAGNOSIS — N1831 Chronic kidney disease, stage 3a: Secondary | ICD-10-CM | POA: Diagnosis not present

## 2022-07-10 DIAGNOSIS — M6281 Muscle weakness (generalized): Secondary | ICD-10-CM | POA: Diagnosis not present

## 2022-07-10 DIAGNOSIS — R278 Other lack of coordination: Secondary | ICD-10-CM | POA: Diagnosis not present

## 2022-07-10 DIAGNOSIS — R2681 Unsteadiness on feet: Secondary | ICD-10-CM | POA: Diagnosis not present

## 2022-07-10 DIAGNOSIS — L89150 Pressure ulcer of sacral region, unstageable: Secondary | ICD-10-CM | POA: Diagnosis not present

## 2022-07-10 DIAGNOSIS — I5032 Chronic diastolic (congestive) heart failure: Secondary | ICD-10-CM | POA: Diagnosis not present

## 2022-07-10 DIAGNOSIS — S72462D Displaced supracondylar fracture with intracondylar extension of lower end of left femur, subsequent encounter for closed fracture with routine healing: Secondary | ICD-10-CM | POA: Diagnosis not present

## 2022-07-10 DIAGNOSIS — R279 Unspecified lack of coordination: Secondary | ICD-10-CM | POA: Diagnosis not present

## 2022-07-10 NOTE — Patient Outreach (Signed)
[facility] Post-Acute Care Coordinator follow up.   Verified in [facility] Ms. Spampinato transferred to [facility]. Confirmed with Jill Side, SNF SW at [facility] that Ms. Zertuche admitted under long term care.   No identifiable [facility]N needs.    Raiford Noble, MSN, RN,BSN [facility] Post Acute Care Coordinator [phone] [facility]) [phone]  (Toll free office)

## 2022-07-11 DIAGNOSIS — R279 Unspecified lack of coordination: Secondary | ICD-10-CM | POA: Diagnosis not present

## 2022-07-11 DIAGNOSIS — R2681 Unsteadiness on feet: Secondary | ICD-10-CM | POA: Diagnosis not present

## 2022-07-11 DIAGNOSIS — Z9181 History of falling: Secondary | ICD-10-CM | POA: Diagnosis not present

## 2022-07-11 DIAGNOSIS — R278 Other lack of coordination: Secondary | ICD-10-CM | POA: Diagnosis not present

## 2022-07-11 DIAGNOSIS — N1831 Chronic kidney disease, stage 3a: Secondary | ICD-10-CM | POA: Diagnosis not present

## 2022-07-11 DIAGNOSIS — I5032 Chronic diastolic (congestive) heart failure: Secondary | ICD-10-CM | POA: Diagnosis not present

## 2022-07-11 DIAGNOSIS — M6281 Muscle weakness (generalized): Secondary | ICD-10-CM | POA: Diagnosis not present

## 2022-07-11 DIAGNOSIS — S72462D Displaced supracondylar fracture with intracondylar extension of lower end of left femur, subsequent encounter for closed fracture with routine healing: Secondary | ICD-10-CM | POA: Diagnosis not present

## 2022-07-11 DIAGNOSIS — Z4789 Encounter for other orthopedic aftercare: Secondary | ICD-10-CM | POA: Diagnosis not present

## 2022-07-12 ENCOUNTER — Other Ambulatory Visit: Payer: Self-pay | Admitting: Internal Medicine

## 2022-07-12 DIAGNOSIS — Z9181 History of falling: Secondary | ICD-10-CM | POA: Diagnosis not present

## 2022-07-12 DIAGNOSIS — M6281 Muscle weakness (generalized): Secondary | ICD-10-CM | POA: Diagnosis not present

## 2022-07-12 DIAGNOSIS — Z4789 Encounter for other orthopedic aftercare: Secondary | ICD-10-CM | POA: Diagnosis not present

## 2022-07-12 DIAGNOSIS — R278 Other lack of coordination: Secondary | ICD-10-CM | POA: Diagnosis not present

## 2022-07-12 DIAGNOSIS — S72462D Displaced supracondylar fracture with intracondylar extension of lower end of left femur, subsequent encounter for closed fracture with routine healing: Secondary | ICD-10-CM | POA: Diagnosis not present

## 2022-07-12 DIAGNOSIS — I5032 Chronic diastolic (congestive) heart failure: Secondary | ICD-10-CM | POA: Diagnosis not present

## 2022-07-12 DIAGNOSIS — R2681 Unsteadiness on feet: Secondary | ICD-10-CM | POA: Diagnosis not present

## 2022-07-12 DIAGNOSIS — N1831 Chronic kidney disease, stage 3a: Secondary | ICD-10-CM | POA: Diagnosis not present

## 2022-07-12 DIAGNOSIS — R279 Unspecified lack of coordination: Secondary | ICD-10-CM | POA: Diagnosis not present

## 2022-07-15 DIAGNOSIS — R2681 Unsteadiness on feet: Secondary | ICD-10-CM | POA: Diagnosis not present

## 2022-07-15 DIAGNOSIS — Z9181 History of falling: Secondary | ICD-10-CM | POA: Diagnosis not present

## 2022-07-15 DIAGNOSIS — N1831 Chronic kidney disease, stage 3a: Secondary | ICD-10-CM | POA: Diagnosis not present

## 2022-07-15 DIAGNOSIS — I5032 Chronic diastolic (congestive) heart failure: Secondary | ICD-10-CM | POA: Diagnosis not present

## 2022-07-15 DIAGNOSIS — M6281 Muscle weakness (generalized): Secondary | ICD-10-CM | POA: Diagnosis not present

## 2022-07-15 DIAGNOSIS — R279 Unspecified lack of coordination: Secondary | ICD-10-CM | POA: Diagnosis not present

## 2022-07-15 DIAGNOSIS — R278 Other lack of coordination: Secondary | ICD-10-CM | POA: Diagnosis not present

## 2022-07-15 DIAGNOSIS — S72462D Displaced supracondylar fracture with intracondylar extension of lower end of left femur, subsequent encounter for closed fracture with routine healing: Secondary | ICD-10-CM | POA: Diagnosis not present

## 2022-07-15 DIAGNOSIS — Z4789 Encounter for other orthopedic aftercare: Secondary | ICD-10-CM | POA: Diagnosis not present

## 2022-07-16 DIAGNOSIS — Z9181 History of falling: Secondary | ICD-10-CM | POA: Diagnosis not present

## 2022-07-16 DIAGNOSIS — S72462D Displaced supracondylar fracture with intracondylar extension of lower end of left femur, subsequent encounter for closed fracture with routine healing: Secondary | ICD-10-CM | POA: Diagnosis not present

## 2022-07-16 DIAGNOSIS — Z4789 Encounter for other orthopedic aftercare: Secondary | ICD-10-CM | POA: Diagnosis not present

## 2022-07-16 DIAGNOSIS — M6281 Muscle weakness (generalized): Secondary | ICD-10-CM | POA: Diagnosis not present

## 2022-07-16 DIAGNOSIS — N1831 Chronic kidney disease, stage 3a: Secondary | ICD-10-CM | POA: Diagnosis not present

## 2022-07-16 DIAGNOSIS — R2681 Unsteadiness on feet: Secondary | ICD-10-CM | POA: Diagnosis not present

## 2022-07-16 DIAGNOSIS — R279 Unspecified lack of coordination: Secondary | ICD-10-CM | POA: Diagnosis not present

## 2022-07-16 DIAGNOSIS — I5032 Chronic diastolic (congestive) heart failure: Secondary | ICD-10-CM | POA: Diagnosis not present

## 2022-07-16 DIAGNOSIS — R278 Other lack of coordination: Secondary | ICD-10-CM | POA: Diagnosis not present

## 2022-07-17 DIAGNOSIS — N1831 Chronic kidney disease, stage 3a: Secondary | ICD-10-CM | POA: Diagnosis not present

## 2022-07-17 DIAGNOSIS — I5032 Chronic diastolic (congestive) heart failure: Secondary | ICD-10-CM | POA: Diagnosis not present

## 2022-07-17 DIAGNOSIS — R278 Other lack of coordination: Secondary | ICD-10-CM | POA: Diagnosis not present

## 2022-07-17 DIAGNOSIS — R2681 Unsteadiness on feet: Secondary | ICD-10-CM | POA: Diagnosis not present

## 2022-07-17 DIAGNOSIS — S72462D Displaced supracondylar fracture with intracondylar extension of lower end of left femur, subsequent encounter for closed fracture with routine healing: Secondary | ICD-10-CM | POA: Diagnosis not present

## 2022-07-17 DIAGNOSIS — L89623 Pressure ulcer of left heel, stage 3: Secondary | ICD-10-CM | POA: Diagnosis not present

## 2022-07-17 DIAGNOSIS — Z4789 Encounter for other orthopedic aftercare: Secondary | ICD-10-CM | POA: Diagnosis not present

## 2022-07-17 DIAGNOSIS — Z9181 History of falling: Secondary | ICD-10-CM | POA: Diagnosis not present

## 2022-07-17 DIAGNOSIS — L89153 Pressure ulcer of sacral region, stage 3: Secondary | ICD-10-CM | POA: Diagnosis not present

## 2022-07-17 DIAGNOSIS — M6281 Muscle weakness (generalized): Secondary | ICD-10-CM | POA: Diagnosis not present

## 2022-07-17 DIAGNOSIS — R279 Unspecified lack of coordination: Secondary | ICD-10-CM | POA: Diagnosis not present

## 2022-07-18 DIAGNOSIS — S72462D Displaced supracondylar fracture with intracondylar extension of lower end of left femur, subsequent encounter for closed fracture with routine healing: Secondary | ICD-10-CM | POA: Diagnosis not present

## 2022-07-18 DIAGNOSIS — N1831 Chronic kidney disease, stage 3a: Secondary | ICD-10-CM | POA: Diagnosis not present

## 2022-07-18 DIAGNOSIS — M6281 Muscle weakness (generalized): Secondary | ICD-10-CM | POA: Diagnosis not present

## 2022-07-18 DIAGNOSIS — Z9181 History of falling: Secondary | ICD-10-CM | POA: Diagnosis not present

## 2022-07-18 DIAGNOSIS — I5032 Chronic diastolic (congestive) heart failure: Secondary | ICD-10-CM | POA: Diagnosis not present

## 2022-07-18 DIAGNOSIS — R278 Other lack of coordination: Secondary | ICD-10-CM | POA: Diagnosis not present

## 2022-07-18 DIAGNOSIS — R279 Unspecified lack of coordination: Secondary | ICD-10-CM | POA: Diagnosis not present

## 2022-07-18 DIAGNOSIS — R2681 Unsteadiness on feet: Secondary | ICD-10-CM | POA: Diagnosis not present

## 2022-07-18 DIAGNOSIS — Z4789 Encounter for other orthopedic aftercare: Secondary | ICD-10-CM | POA: Diagnosis not present

## 2022-07-19 DIAGNOSIS — R2681 Unsteadiness on feet: Secondary | ICD-10-CM | POA: Diagnosis not present

## 2022-07-19 DIAGNOSIS — I5032 Chronic diastolic (congestive) heart failure: Secondary | ICD-10-CM | POA: Diagnosis not present

## 2022-07-19 DIAGNOSIS — Z4789 Encounter for other orthopedic aftercare: Secondary | ICD-10-CM | POA: Diagnosis not present

## 2022-07-19 DIAGNOSIS — M6281 Muscle weakness (generalized): Secondary | ICD-10-CM | POA: Diagnosis not present

## 2022-07-19 DIAGNOSIS — S72462D Displaced supracondylar fracture with intracondylar extension of lower end of left femur, subsequent encounter for closed fracture with routine healing: Secondary | ICD-10-CM | POA: Diagnosis not present

## 2022-07-19 DIAGNOSIS — N1831 Chronic kidney disease, stage 3a: Secondary | ICD-10-CM | POA: Diagnosis not present

## 2022-07-19 DIAGNOSIS — R279 Unspecified lack of coordination: Secondary | ICD-10-CM | POA: Diagnosis not present

## 2022-07-19 DIAGNOSIS — R278 Other lack of coordination: Secondary | ICD-10-CM | POA: Diagnosis not present

## 2022-07-19 DIAGNOSIS — Z9181 History of falling: Secondary | ICD-10-CM | POA: Diagnosis not present

## 2022-07-22 DIAGNOSIS — R2681 Unsteadiness on feet: Secondary | ICD-10-CM | POA: Diagnosis not present

## 2022-07-22 DIAGNOSIS — S72462D Displaced supracondylar fracture with intracondylar extension of lower end of left femur, subsequent encounter for closed fracture with routine healing: Secondary | ICD-10-CM | POA: Diagnosis not present

## 2022-07-22 DIAGNOSIS — M6281 Muscle weakness (generalized): Secondary | ICD-10-CM | POA: Diagnosis not present

## 2022-07-22 DIAGNOSIS — I5032 Chronic diastolic (congestive) heart failure: Secondary | ICD-10-CM | POA: Diagnosis not present

## 2022-07-22 DIAGNOSIS — N1831 Chronic kidney disease, stage 3a: Secondary | ICD-10-CM | POA: Diagnosis not present

## 2022-07-22 DIAGNOSIS — Z9181 History of falling: Secondary | ICD-10-CM | POA: Diagnosis not present

## 2022-07-22 DIAGNOSIS — Z4789 Encounter for other orthopedic aftercare: Secondary | ICD-10-CM | POA: Diagnosis not present

## 2022-07-22 DIAGNOSIS — R279 Unspecified lack of coordination: Secondary | ICD-10-CM | POA: Diagnosis not present

## 2022-07-22 DIAGNOSIS — R278 Other lack of coordination: Secondary | ICD-10-CM | POA: Diagnosis not present

## 2022-07-23 DIAGNOSIS — S72462D Displaced supracondylar fracture with intracondylar extension of lower end of left femur, subsequent encounter for closed fracture with routine healing: Secondary | ICD-10-CM | POA: Diagnosis not present

## 2022-07-23 DIAGNOSIS — I5032 Chronic diastolic (congestive) heart failure: Secondary | ICD-10-CM | POA: Diagnosis not present

## 2022-07-23 DIAGNOSIS — R279 Unspecified lack of coordination: Secondary | ICD-10-CM | POA: Diagnosis not present

## 2022-07-23 DIAGNOSIS — R278 Other lack of coordination: Secondary | ICD-10-CM | POA: Diagnosis not present

## 2022-07-23 DIAGNOSIS — R2681 Unsteadiness on feet: Secondary | ICD-10-CM | POA: Diagnosis not present

## 2022-07-23 DIAGNOSIS — Z4789 Encounter for other orthopedic aftercare: Secondary | ICD-10-CM | POA: Diagnosis not present

## 2022-07-23 DIAGNOSIS — M6281 Muscle weakness (generalized): Secondary | ICD-10-CM | POA: Diagnosis not present

## 2022-07-23 DIAGNOSIS — Z9181 History of falling: Secondary | ICD-10-CM | POA: Diagnosis not present

## 2022-07-23 DIAGNOSIS — N1831 Chronic kidney disease, stage 3a: Secondary | ICD-10-CM | POA: Diagnosis not present

## 2022-07-24 DIAGNOSIS — L89153 Pressure ulcer of sacral region, stage 3: Secondary | ICD-10-CM | POA: Diagnosis not present

## 2022-07-24 DIAGNOSIS — R300 Dysuria: Secondary | ICD-10-CM | POA: Diagnosis not present

## 2022-07-24 DIAGNOSIS — L89623 Pressure ulcer of left heel, stage 3: Secondary | ICD-10-CM | POA: Diagnosis not present

## 2022-07-24 DIAGNOSIS — R0602 Shortness of breath: Secondary | ICD-10-CM | POA: Diagnosis not present

## 2022-07-24 DIAGNOSIS — R109 Unspecified abdominal pain: Secondary | ICD-10-CM | POA: Diagnosis not present

## 2022-07-25 ENCOUNTER — Encounter: Payer: Medicare Other | Admitting: Family

## 2022-07-25 DIAGNOSIS — R279 Unspecified lack of coordination: Secondary | ICD-10-CM | POA: Diagnosis not present

## 2022-07-25 DIAGNOSIS — R278 Other lack of coordination: Secondary | ICD-10-CM | POA: Diagnosis not present

## 2022-07-25 DIAGNOSIS — I5032 Chronic diastolic (congestive) heart failure: Secondary | ICD-10-CM | POA: Diagnosis not present

## 2022-07-25 DIAGNOSIS — Z4789 Encounter for other orthopedic aftercare: Secondary | ICD-10-CM | POA: Diagnosis not present

## 2022-07-25 DIAGNOSIS — Z9181 History of falling: Secondary | ICD-10-CM | POA: Diagnosis not present

## 2022-07-25 DIAGNOSIS — M6281 Muscle weakness (generalized): Secondary | ICD-10-CM | POA: Diagnosis not present

## 2022-07-25 DIAGNOSIS — N1831 Chronic kidney disease, stage 3a: Secondary | ICD-10-CM | POA: Diagnosis not present

## 2022-07-25 DIAGNOSIS — S72462D Displaced supracondylar fracture with intracondylar extension of lower end of left femur, subsequent encounter for closed fracture with routine healing: Secondary | ICD-10-CM | POA: Diagnosis not present

## 2022-07-25 DIAGNOSIS — R2681 Unsteadiness on feet: Secondary | ICD-10-CM | POA: Diagnosis not present

## 2022-07-25 NOTE — Progress Notes (Signed)
  This encounter was created in error - please disregard. No show 

## 2022-07-29 ENCOUNTER — Other Ambulatory Visit: Payer: Medicare Other

## 2022-07-30 DIAGNOSIS — R278 Other lack of coordination: Secondary | ICD-10-CM | POA: Diagnosis not present

## 2022-07-30 DIAGNOSIS — R279 Unspecified lack of coordination: Secondary | ICD-10-CM | POA: Diagnosis not present

## 2022-07-30 DIAGNOSIS — Z4789 Encounter for other orthopedic aftercare: Secondary | ICD-10-CM | POA: Diagnosis not present

## 2022-07-30 DIAGNOSIS — M6281 Muscle weakness (generalized): Secondary | ICD-10-CM | POA: Diagnosis not present

## 2022-07-30 DIAGNOSIS — I5032 Chronic diastolic (congestive) heart failure: Secondary | ICD-10-CM | POA: Diagnosis not present

## 2022-07-30 DIAGNOSIS — Z9181 History of falling: Secondary | ICD-10-CM | POA: Diagnosis not present

## 2022-07-30 DIAGNOSIS — R2681 Unsteadiness on feet: Secondary | ICD-10-CM | POA: Diagnosis not present

## 2022-07-30 DIAGNOSIS — S72462D Displaced supracondylar fracture with intracondylar extension of lower end of left femur, subsequent encounter for closed fracture with routine healing: Secondary | ICD-10-CM | POA: Diagnosis not present

## 2022-07-30 DIAGNOSIS — N1831 Chronic kidney disease, stage 3a: Secondary | ICD-10-CM | POA: Diagnosis not present

## 2022-07-31 DIAGNOSIS — R279 Unspecified lack of coordination: Secondary | ICD-10-CM | POA: Diagnosis not present

## 2022-07-31 DIAGNOSIS — Z4789 Encounter for other orthopedic aftercare: Secondary | ICD-10-CM | POA: Diagnosis not present

## 2022-07-31 DIAGNOSIS — M6281 Muscle weakness (generalized): Secondary | ICD-10-CM | POA: Diagnosis not present

## 2022-07-31 DIAGNOSIS — R278 Other lack of coordination: Secondary | ICD-10-CM | POA: Diagnosis not present

## 2022-07-31 DIAGNOSIS — N1831 Chronic kidney disease, stage 3a: Secondary | ICD-10-CM | POA: Diagnosis not present

## 2022-07-31 DIAGNOSIS — I5032 Chronic diastolic (congestive) heart failure: Secondary | ICD-10-CM | POA: Diagnosis not present

## 2022-07-31 DIAGNOSIS — L89153 Pressure ulcer of sacral region, stage 3: Secondary | ICD-10-CM | POA: Diagnosis not present

## 2022-07-31 DIAGNOSIS — R2681 Unsteadiness on feet: Secondary | ICD-10-CM | POA: Diagnosis not present

## 2022-07-31 DIAGNOSIS — Z9181 History of falling: Secondary | ICD-10-CM | POA: Diagnosis not present

## 2022-07-31 DIAGNOSIS — S72462D Displaced supracondylar fracture with intracondylar extension of lower end of left femur, subsequent encounter for closed fracture with routine healing: Secondary | ICD-10-CM | POA: Diagnosis not present

## 2022-08-02 DIAGNOSIS — Z4789 Encounter for other orthopedic aftercare: Secondary | ICD-10-CM | POA: Diagnosis not present

## 2022-08-02 DIAGNOSIS — M6281 Muscle weakness (generalized): Secondary | ICD-10-CM | POA: Diagnosis not present

## 2022-08-02 DIAGNOSIS — N1831 Chronic kidney disease, stage 3a: Secondary | ICD-10-CM | POA: Diagnosis not present

## 2022-08-02 DIAGNOSIS — S72462D Displaced supracondylar fracture with intracondylar extension of lower end of left femur, subsequent encounter for closed fracture with routine healing: Secondary | ICD-10-CM | POA: Diagnosis not present

## 2022-08-02 DIAGNOSIS — I5032 Chronic diastolic (congestive) heart failure: Secondary | ICD-10-CM | POA: Diagnosis not present

## 2022-08-02 DIAGNOSIS — R279 Unspecified lack of coordination: Secondary | ICD-10-CM | POA: Diagnosis not present

## 2022-08-02 DIAGNOSIS — R2681 Unsteadiness on feet: Secondary | ICD-10-CM | POA: Diagnosis not present

## 2022-08-02 DIAGNOSIS — R278 Other lack of coordination: Secondary | ICD-10-CM | POA: Diagnosis not present

## 2022-08-02 DIAGNOSIS — Z9181 History of falling: Secondary | ICD-10-CM | POA: Diagnosis not present

## 2022-08-05 DIAGNOSIS — I5032 Chronic diastolic (congestive) heart failure: Secondary | ICD-10-CM | POA: Diagnosis not present

## 2022-08-05 DIAGNOSIS — Z9181 History of falling: Secondary | ICD-10-CM | POA: Diagnosis not present

## 2022-08-05 DIAGNOSIS — M6281 Muscle weakness (generalized): Secondary | ICD-10-CM | POA: Diagnosis not present

## 2022-08-05 DIAGNOSIS — S72462D Displaced supracondylar fracture with intracondylar extension of lower end of left femur, subsequent encounter for closed fracture with routine healing: Secondary | ICD-10-CM | POA: Diagnosis not present

## 2022-08-05 DIAGNOSIS — R278 Other lack of coordination: Secondary | ICD-10-CM | POA: Diagnosis not present

## 2022-08-05 DIAGNOSIS — R2681 Unsteadiness on feet: Secondary | ICD-10-CM | POA: Diagnosis not present

## 2022-08-05 DIAGNOSIS — Z4789 Encounter for other orthopedic aftercare: Secondary | ICD-10-CM | POA: Diagnosis not present

## 2022-08-05 DIAGNOSIS — R279 Unspecified lack of coordination: Secondary | ICD-10-CM | POA: Diagnosis not present

## 2022-08-05 DIAGNOSIS — N1831 Chronic kidney disease, stage 3a: Secondary | ICD-10-CM | POA: Diagnosis not present

## 2022-08-06 DIAGNOSIS — I5032 Chronic diastolic (congestive) heart failure: Secondary | ICD-10-CM | POA: Diagnosis not present

## 2022-08-06 DIAGNOSIS — S72462D Displaced supracondylar fracture with intracondylar extension of lower end of left femur, subsequent encounter for closed fracture with routine healing: Secondary | ICD-10-CM | POA: Diagnosis not present

## 2022-08-06 DIAGNOSIS — R278 Other lack of coordination: Secondary | ICD-10-CM | POA: Diagnosis not present

## 2022-08-06 DIAGNOSIS — Z9181 History of falling: Secondary | ICD-10-CM | POA: Diagnosis not present

## 2022-08-06 DIAGNOSIS — Z4789 Encounter for other orthopedic aftercare: Secondary | ICD-10-CM | POA: Diagnosis not present

## 2022-08-06 DIAGNOSIS — N1831 Chronic kidney disease, stage 3a: Secondary | ICD-10-CM | POA: Diagnosis not present

## 2022-08-06 DIAGNOSIS — R279 Unspecified lack of coordination: Secondary | ICD-10-CM | POA: Diagnosis not present

## 2022-08-06 DIAGNOSIS — R2681 Unsteadiness on feet: Secondary | ICD-10-CM | POA: Diagnosis not present

## 2022-08-06 DIAGNOSIS — M6281 Muscle weakness (generalized): Secondary | ICD-10-CM | POA: Diagnosis not present

## 2022-08-07 DIAGNOSIS — R279 Unspecified lack of coordination: Secondary | ICD-10-CM | POA: Diagnosis not present

## 2022-08-07 DIAGNOSIS — S72462D Displaced supracondylar fracture with intracondylar extension of lower end of left femur, subsequent encounter for closed fracture with routine healing: Secondary | ICD-10-CM | POA: Diagnosis not present

## 2022-08-07 DIAGNOSIS — M6281 Muscle weakness (generalized): Secondary | ICD-10-CM | POA: Diagnosis not present

## 2022-08-07 DIAGNOSIS — N1831 Chronic kidney disease, stage 3a: Secondary | ICD-10-CM | POA: Diagnosis not present

## 2022-08-07 DIAGNOSIS — I5032 Chronic diastolic (congestive) heart failure: Secondary | ICD-10-CM | POA: Diagnosis not present

## 2022-08-07 DIAGNOSIS — R2681 Unsteadiness on feet: Secondary | ICD-10-CM | POA: Diagnosis not present

## 2022-08-07 DIAGNOSIS — Z4789 Encounter for other orthopedic aftercare: Secondary | ICD-10-CM | POA: Diagnosis not present

## 2022-08-07 DIAGNOSIS — Z9181 History of falling: Secondary | ICD-10-CM | POA: Diagnosis not present

## 2022-08-07 DIAGNOSIS — R278 Other lack of coordination: Secondary | ICD-10-CM | POA: Diagnosis not present

## 2022-08-07 DIAGNOSIS — L89153 Pressure ulcer of sacral region, stage 3: Secondary | ICD-10-CM | POA: Diagnosis not present

## 2022-08-08 DIAGNOSIS — R278 Other lack of coordination: Secondary | ICD-10-CM | POA: Diagnosis not present

## 2022-08-08 DIAGNOSIS — Z4789 Encounter for other orthopedic aftercare: Secondary | ICD-10-CM | POA: Diagnosis not present

## 2022-08-08 DIAGNOSIS — Z9181 History of falling: Secondary | ICD-10-CM | POA: Diagnosis not present

## 2022-08-08 DIAGNOSIS — I5032 Chronic diastolic (congestive) heart failure: Secondary | ICD-10-CM | POA: Diagnosis not present

## 2022-08-08 DIAGNOSIS — N1831 Chronic kidney disease, stage 3a: Secondary | ICD-10-CM | POA: Diagnosis not present

## 2022-08-08 DIAGNOSIS — R279 Unspecified lack of coordination: Secondary | ICD-10-CM | POA: Diagnosis not present

## 2022-08-08 DIAGNOSIS — M6281 Muscle weakness (generalized): Secondary | ICD-10-CM | POA: Diagnosis not present

## 2022-08-08 DIAGNOSIS — S72462D Displaced supracondylar fracture with intracondylar extension of lower end of left femur, subsequent encounter for closed fracture with routine healing: Secondary | ICD-10-CM | POA: Diagnosis not present

## 2022-08-08 DIAGNOSIS — R2681 Unsteadiness on feet: Secondary | ICD-10-CM | POA: Diagnosis not present

## 2022-08-12 DIAGNOSIS — R300 Dysuria: Secondary | ICD-10-CM | POA: Diagnosis not present

## 2022-08-14 ENCOUNTER — Encounter (HOSPITAL_BASED_OUTPATIENT_CLINIC_OR_DEPARTMENT_OTHER): Payer: Medicare Other | Admitting: Orthopaedic Surgery

## 2022-08-14 DIAGNOSIS — L89153 Pressure ulcer of sacral region, stage 3: Secondary | ICD-10-CM | POA: Diagnosis not present

## 2022-08-21 DIAGNOSIS — L89153 Pressure ulcer of sacral region, stage 3: Secondary | ICD-10-CM | POA: Diagnosis not present

## 2022-08-26 ENCOUNTER — Encounter: Payer: Self-pay | Admitting: Internal Medicine

## 2022-08-29 DIAGNOSIS — L89153 Pressure ulcer of sacral region, stage 3: Secondary | ICD-10-CM | POA: Diagnosis not present

## 2022-08-29 DIAGNOSIS — L89623 Pressure ulcer of left heel, stage 3: Secondary | ICD-10-CM | POA: Diagnosis not present

## 2022-09-02 DIAGNOSIS — R1311 Dysphagia, oral phase: Secondary | ICD-10-CM | POA: Diagnosis not present

## 2022-09-02 DIAGNOSIS — K219 Gastro-esophageal reflux disease without esophagitis: Secondary | ICD-10-CM | POA: Diagnosis not present

## 2022-09-03 DIAGNOSIS — K219 Gastro-esophageal reflux disease without esophagitis: Secondary | ICD-10-CM | POA: Diagnosis not present

## 2022-09-03 DIAGNOSIS — R1311 Dysphagia, oral phase: Secondary | ICD-10-CM | POA: Diagnosis not present

## 2022-09-05 DIAGNOSIS — L89623 Pressure ulcer of left heel, stage 3: Secondary | ICD-10-CM | POA: Diagnosis not present

## 2022-09-05 DIAGNOSIS — S72462D Displaced supracondylar fracture with intracondylar extension of lower end of left femur, subsequent encounter for closed fracture with routine healing: Secondary | ICD-10-CM | POA: Diagnosis not present

## 2022-09-05 DIAGNOSIS — R1311 Dysphagia, oral phase: Secondary | ICD-10-CM | POA: Diagnosis not present

## 2022-09-05 DIAGNOSIS — M6281 Muscle weakness (generalized): Secondary | ICD-10-CM | POA: Diagnosis not present

## 2022-09-05 DIAGNOSIS — L89153 Pressure ulcer of sacral region, stage 3: Secondary | ICD-10-CM | POA: Diagnosis not present

## 2022-09-05 DIAGNOSIS — K219 Gastro-esophageal reflux disease without esophagitis: Secondary | ICD-10-CM | POA: Diagnosis not present

## 2022-09-06 DIAGNOSIS — Z23 Encounter for immunization: Secondary | ICD-10-CM | POA: Diagnosis not present

## 2022-10-17 ENCOUNTER — Other Ambulatory Visit: Payer: Self-pay

## 2022-10-17 ENCOUNTER — Inpatient Hospital Stay (HOSPITAL_COMMUNITY)
Admission: EM | Admit: 2022-10-17 | Discharge: 2022-10-24 | DRG: 698 | Disposition: A | Discharge status: Skilled Nursing Facility | Payer: Medicare Other | Source: Skilled Nursing Facility | Attending: Internal Medicine | Admitting: Internal Medicine

## 2022-10-17 ENCOUNTER — Encounter (HOSPITAL_COMMUNITY): Payer: Self-pay | Admitting: Emergency Medicine

## 2022-10-17 ENCOUNTER — Emergency Department (HOSPITAL_COMMUNITY): Payer: Medicare Other

## 2022-10-17 DIAGNOSIS — Z7982 Long term (current) use of aspirin: Secondary | ICD-10-CM

## 2022-10-17 DIAGNOSIS — T83511A Infection and inflammatory reaction due to indwelling urethral catheter, initial encounter: Principal | ICD-10-CM | POA: Diagnosis present

## 2022-10-17 DIAGNOSIS — Z87440 Personal history of urinary (tract) infections: Secondary | ICD-10-CM

## 2022-10-17 DIAGNOSIS — F32A Depression, unspecified: Secondary | ICD-10-CM | POA: Diagnosis present

## 2022-10-17 DIAGNOSIS — Z7985 Long-term (current) use of injectable non-insulin antidiabetic drugs: Secondary | ICD-10-CM

## 2022-10-17 DIAGNOSIS — Z6828 Body mass index (BMI) 28.0-28.9, adult: Secondary | ICD-10-CM

## 2022-10-17 DIAGNOSIS — E119 Type 2 diabetes mellitus without complications: Secondary | ICD-10-CM

## 2022-10-17 DIAGNOSIS — L89623 Pressure ulcer of left heel, stage 3: Secondary | ICD-10-CM | POA: Diagnosis present

## 2022-10-17 DIAGNOSIS — Z79899 Other long term (current) drug therapy: Secondary | ICD-10-CM

## 2022-10-17 DIAGNOSIS — G9341 Metabolic encephalopathy: Secondary | ICD-10-CM | POA: Diagnosis present

## 2022-10-17 DIAGNOSIS — A419 Sepsis, unspecified organism: Secondary | ICD-10-CM

## 2022-10-17 DIAGNOSIS — E1142 Type 2 diabetes mellitus with diabetic polyneuropathy: Secondary | ICD-10-CM | POA: Diagnosis present

## 2022-10-17 DIAGNOSIS — I5032 Chronic diastolic (congestive) heart failure: Secondary | ICD-10-CM | POA: Diagnosis present

## 2022-10-17 DIAGNOSIS — K219 Gastro-esophageal reflux disease without esophagitis: Secondary | ICD-10-CM | POA: Diagnosis present

## 2022-10-17 DIAGNOSIS — I110 Hypertensive heart disease with heart failure: Secondary | ICD-10-CM | POA: Diagnosis present

## 2022-10-17 DIAGNOSIS — I890 Lymphedema, not elsewhere classified: Secondary | ICD-10-CM | POA: Diagnosis present

## 2022-10-17 DIAGNOSIS — N12 Tubulo-interstitial nephritis, not specified as acute or chronic: Secondary | ICD-10-CM

## 2022-10-17 DIAGNOSIS — N3090 Cystitis, unspecified without hematuria: Secondary | ICD-10-CM | POA: Diagnosis present

## 2022-10-17 DIAGNOSIS — Z794 Long term (current) use of insulin: Secondary | ICD-10-CM

## 2022-10-17 DIAGNOSIS — I471 Supraventricular tachycardia, unspecified: Secondary | ICD-10-CM

## 2022-10-17 DIAGNOSIS — B9620 Unspecified Escherichia coli [E. coli] as the cause of diseases classified elsewhere: Secondary | ICD-10-CM | POA: Diagnosis present

## 2022-10-17 DIAGNOSIS — Z952 Presence of prosthetic heart valve: Secondary | ICD-10-CM

## 2022-10-17 DIAGNOSIS — K6289 Other specified diseases of anus and rectum: Secondary | ICD-10-CM | POA: Diagnosis present

## 2022-10-17 DIAGNOSIS — Z66 Do not resuscitate: Secondary | ICD-10-CM | POA: Diagnosis present

## 2022-10-17 DIAGNOSIS — Z888 Allergy status to other drugs, medicaments and biological substances status: Secondary | ICD-10-CM

## 2022-10-17 DIAGNOSIS — E876 Hypokalemia: Secondary | ICD-10-CM | POA: Diagnosis present

## 2022-10-17 DIAGNOSIS — M1990 Unspecified osteoarthritis, unspecified site: Secondary | ICD-10-CM | POA: Diagnosis present

## 2022-10-17 DIAGNOSIS — I959 Hypotension, unspecified: Secondary | ICD-10-CM | POA: Diagnosis not present

## 2022-10-17 DIAGNOSIS — N179 Acute kidney failure, unspecified: Secondary | ICD-10-CM

## 2022-10-17 DIAGNOSIS — Z8249 Family history of ischemic heart disease and other diseases of the circulatory system: Secondary | ICD-10-CM

## 2022-10-17 DIAGNOSIS — K5730 Diverticulosis of large intestine without perforation or abscess without bleeding: Secondary | ICD-10-CM | POA: Diagnosis present

## 2022-10-17 DIAGNOSIS — Z833 Family history of diabetes mellitus: Secondary | ICD-10-CM

## 2022-10-17 DIAGNOSIS — D649 Anemia, unspecified: Secondary | ICD-10-CM | POA: Diagnosis present

## 2022-10-17 DIAGNOSIS — E669 Obesity, unspecified: Secondary | ICD-10-CM | POA: Diagnosis present

## 2022-10-17 DIAGNOSIS — E1122 Type 2 diabetes mellitus with diabetic chronic kidney disease: Secondary | ICD-10-CM | POA: Diagnosis present

## 2022-10-17 DIAGNOSIS — R54 Age-related physical debility: Secondary | ICD-10-CM | POA: Diagnosis present

## 2022-10-17 DIAGNOSIS — Z885 Allergy status to narcotic agent status: Secondary | ICD-10-CM

## 2022-10-17 DIAGNOSIS — J9601 Acute respiratory failure with hypoxia: Secondary | ICD-10-CM | POA: Insufficient documentation

## 2022-10-17 DIAGNOSIS — E871 Hypo-osmolality and hyponatremia: Secondary | ICD-10-CM | POA: Diagnosis present

## 2022-10-17 DIAGNOSIS — R6520 Severe sepsis without septic shock: Secondary | ICD-10-CM | POA: Diagnosis present

## 2022-10-17 DIAGNOSIS — Z1612 Extended spectrum beta lactamase (ESBL) resistance: Secondary | ICD-10-CM | POA: Diagnosis present

## 2022-10-17 DIAGNOSIS — I10 Essential (primary) hypertension: Secondary | ICD-10-CM | POA: Diagnosis present

## 2022-10-17 DIAGNOSIS — Z7984 Long term (current) use of oral hypoglycemic drugs: Secondary | ICD-10-CM

## 2022-10-17 DIAGNOSIS — I5030 Unspecified diastolic (congestive) heart failure: Secondary | ICD-10-CM | POA: Insufficient documentation

## 2022-10-17 DIAGNOSIS — R4182 Altered mental status, unspecified: Secondary | ICD-10-CM | POA: Insufficient documentation

## 2022-10-17 DIAGNOSIS — E785 Hyperlipidemia, unspecified: Secondary | ICD-10-CM | POA: Diagnosis present

## 2022-10-17 DIAGNOSIS — B961 Klebsiella pneumoniae [K. pneumoniae] as the cause of diseases classified elsewhere: Secondary | ICD-10-CM | POA: Diagnosis present

## 2022-10-17 DIAGNOSIS — N390 Urinary tract infection, site not specified: Secondary | ICD-10-CM | POA: Diagnosis present

## 2022-10-17 LAB — CBC WITH DIFFERENTIAL/PLATELET
Abs Immature Granulocytes: 0.1 10*3/uL — ABNORMAL HIGH (ref 0.00–0.07)
Basophils Absolute: 0.1 10*3/uL (ref 0.0–0.1)
Basophils Relative: 0 %
Eosinophils Absolute: 0 10*3/uL (ref 0.0–0.5)
Eosinophils Relative: 0 %
HCT: 35.5 % — ABNORMAL LOW (ref 36.0–46.0)
Hemoglobin: 11.8 g/dL — ABNORMAL LOW (ref 12.0–15.0)
Immature Granulocytes: 0 %
Lymphocytes Relative: 15 %
Lymphs Abs: 3.6 10*3/uL (ref 0.7–4.0)
MCH: 28.4 pg (ref 26.0–34.0)
MCHC: 33.2 g/dL (ref 30.0–36.0)
MCV: 85.3 fL (ref 80.0–100.0)
Monocytes Absolute: 1.4 10*3/uL — ABNORMAL HIGH (ref 0.1–1.0)
Monocytes Relative: 6 %
Neutro Abs: 18.1 10*3/uL — ABNORMAL HIGH (ref 1.7–7.7)
Neutrophils Relative %: 79 %
Platelets: 244 10*3/uL (ref 150–400)
RBC: 4.16 MIL/uL (ref 3.87–5.11)
RDW: 15 % (ref 11.5–15.5)
WBC: 23.3 10*3/uL — ABNORMAL HIGH (ref 4.0–10.5)
nRBC: 0 % (ref 0.0–0.2)

## 2022-10-17 LAB — BASIC METABOLIC PANEL
Anion gap: 10 (ref 5–15)
BUN: 51 mg/dL — ABNORMAL HIGH (ref 8–23)
CO2: 30 mmol/L (ref 22–32)
Calcium: 8.9 mg/dL (ref 8.9–10.3)
Chloride: 94 mmol/L — ABNORMAL LOW (ref 98–111)
Creatinine, Ser: 1.3 mg/dL — ABNORMAL HIGH (ref 0.44–1.00)
GFR, Estimated: 43 mL/min — ABNORMAL LOW (ref >60)
Glucose, Bld: 186 mg/dL — ABNORMAL HIGH (ref 70–99)
Potassium: 4 mmol/L (ref 3.5–5.1)
Sodium: 134 mmol/L — ABNORMAL LOW (ref 135–145)

## 2022-10-17 NOTE — ED Provider Notes (Signed)
[facility] EMERGENCY DEPARTMENT Provider Note   CSN: [id number] Arrival date & time: 10/17/22  2233     History  Chief Complaint  Patient presents with   UTI    Jeffrey Voth is a 76 y.o. female.  76 y/o female with hx of DM, HTN, HLD, CKD presents to the ED from [facility] for increased lethargy and intermittent confusion. Per facility reports, patient was diagnosed with a UTI a few days ago for which she was worse giving IM Rocephin.  She had labs completed at the facility today given intermittent confusion despite antibiotics.  This showed a WBC of 23.1. She was documented to have been hypoxic down to 87% on RA, but has no chronic O2 requirement. No reported fevers. Patient has c/o some vague abdominal pain as well as pain to her bottom from "sitting too long".   Arrives with DNR form.  The history is provided by the patient. No language interpreter was used.       Home Medications Prior to Admission medications   Medication Sig Start Date End Date Taking? Authorizing Provider  acetaminophen (TYLENOL) 500 MG tablet Take 1 tablet (500 mg total) by mouth every 6 (six) hours as needed. Patient taking differently: Take 500 mg by mouth every 4 (four) hours as needed for mild pain. 05/22/22  Yes Pokhrel, Laxman, MD  atorvastatin (LIPITOR) 80 MG tablet Take 1 tablet (80 mg total) by mouth daily. 03/29/22  Yes Myrlene Broker, MD  cefTRIAXone (ROCEPHIN) 500 MG injection Inject 500 mg into the muscle once. For IM use in large muscle mass   Yes [provider]  citalopram (CELEXA) 20 MG tablet Take 1 tablet (20 mg total) by mouth daily. 10/04/21  Yes Myrlene Broker, MD  Dulaglutide (TRULICITY) 1.5 MG/0.5ML SOPN Inject 1.5 mg into the skin every 7 (seven) days. On Saturdays   Yes [provider]  esomeprazole (NEXIUM) 20 MG capsule Take 1 capsule (20 mg total) by mouth daily at 12 noon. 03/07/20  Yes Myrlene Broker, MD   ezetimibe (ZETIA) 10 MG tablet TAKE 1 TABLET BY MOUTH  DAILY Patient taking differently: Take 10 mg by mouth daily. 02/05/22  Yes Myrlene Broker, MD  gabapentin (NEURONTIN) 300 MG capsule TAKE 1 CAPSULE BY MOUTH 3  TIMES DAILY Patient taking differently: Take 300 mg by mouth 3 (three) times daily. 04/12/22  Yes Myrlene Broker, MD  [employer] [facility]) OINT Apply 1 application  topically See admin instructions. Apply to buttocks, sacrum topically every shift (three times a day) for protection   Yes [provider]  insulin glargine (LANTUS) 100 UNIT/ML Solostar Pen Inject 14 Units into the skin at bedtime.   Yes [provider]  magnesium oxide (MAG-OX) 400 (240 Mg) MG tablet Take 400 mg by mouth 2 (two) times daily.   Yes [provider]  metFORMIN (GLUCOPHAGE) 500 MG tablet Take 500 mg by mouth in the morning and at bedtime.   Yes [provider]  metoprolol tartrate (LOPRESSOR) 25 MG tablet Take 0.5 tablets (12.5 mg total) by mouth 2 (two) times daily. 10/04/21  Yes Myrlene Broker, MD  promethazine (PHENERGAN) 25 MG tablet Take 25 mg by mouth every 12 (twelve) hours as needed for nausea or vomiting.   Yes [provider]  torsemide (DEMADEX) 20 MG tablet Take 1 tablet (20 mg total) by mouth daily. 10/04/21  Yes Myrlene Broker, MD  ASPERCREME LIDOCAINE EX Apply  1 application  topically as needed (pain). Patient not taking: Reported on 10/18/2022    [provider]  aspirin 81 MG tablet Take 1 tablet (81 mg total) by mouth daily. Start on July 14,2023 after completing full dose aspirin Patient not taking: Reported on 10/18/2022 06/21/22   Pokhrel, Rebekah ChesterfieldLaxman, MD  collagenase (SANTYL) 250 UNIT/GM ointment Apply 1 application. topically 3 (three) times daily. Apply to sacrum Patient not taking: Reported on 10/18/2022 05/02/22   [name], [name], NP  Continuous Blood Gluc Sensor (FREESTYLE LIBRE SENSOR SYSTEM) MISC  Use to monitor sugars 05/02/22   [name], [name], NP  Insulin Pen Needle 31G X 8 MM MISC Use to inject insulin four times daily E11.41 05/02/22   [name], [name], NP  nystatin (MYCOSTATIN/NYSTOP) powder Apply 1 application. topically 3 (three) times daily. Patient not taking: Reported on 10/18/2022 04/24/22   [name], Donalee Citrininah C, NP  [facility] Surgery[facility] VERIO test strip USE AS DIRECTED 03/13/22   Myrlene Brokerrawford, [name], MD  oxyCODONE (OXY IR/ROXICODONE) 5 MG immediate release tablet Take 1 tablet (5 mg total) by mouth every 6 (six) hours as needed for moderate pain or severe pain. Patient not taking: Reported on 10/18/2022 05/22/22   Pokhrel, Rebekah ChesterfieldLaxman, MD  Semaglutide (RYBELSUS) 14 MG TABS Take 14 mg by mouth daily. Patient not taking: Reported on 10/18/2022 10/04/21   Myrlene Brokerrawford, [name], MD  silver sulfADIAZINE (SILVADENE) 1 % cream Apply 1 application topically daily. Patient not taking: Reported on 10/18/2022 01/01/22   Myrlene Brokerrawford, [name], MD  sodium chloride (OCEAN) 0.65 % SOLN nasal spray Place 1 spray into both nostrils 4 (four) times daily. Patient not taking: Reported on 10/18/2022 03/07/20   Myrlene Brokerrawford, [name], MD  TRESIBA FLEXTOUCH 200 UNIT/ML FlexTouch Pen INJECT SUBCUTANEOUSLY 44 UNITS  AT BEDTIME Patient not taking: Reported on 10/18/2022 06/28/22   [name], [name], NP      Allergies    Morphine, Codeine sulfate, Demerol [meperidine], and Propoxyphene hcl    Review of Systems   Review of Systems Ten systems reviewed and are negative for acute change, except as noted in the HPI.    Physical Exam Updated Vital Signs BP 126/74 (BP Location: Right Arm)   Pulse 86   Temp 98.8 F (37.1 C) (Oral)   Resp 18   SpO2 100%   Physical Exam Vitals and nursing note reviewed.  Constitutional:      General: She is not in acute distress.    Appearance: She is well-developed. She is not diaphoretic.     Comments: Chronically ill appearing, obese female  HENT:     Head: Normocephalic and  atraumatic.  Eyes:     General: No scleral icterus.    Conjunctiva/sclera: Conjunctivae normal.  Cardiovascular:     Rate and Rhythm: Normal rate and regular rhythm.     Pulses: Normal pulses.  Pulmonary:     Effort: Pulmonary effort is normal. No respiratory distress.     Breath sounds: No wheezing or rhonchi.     Comments: Respirations even and unlabored. Lungs CTAB. Abdominal:     Palpations: Abdomen is soft.     Tenderness: There is abdominal tenderness. There is no guarding.     Comments: Abdomen soft, obese, nondistended. Generalized TTP without peritoneal signs or palpable masses.  Genitourinary:    Comments: No significant sacral wound on exam. Foley catheter with turbid, dark yellow/pale brown urine. Musculoskeletal:        General: Normal range of motion.  Cervical back: Normal range of motion.  Skin:    General: Skin is warm and dry.     Coloration: Skin is not pale.     Findings: No erythema or rash.  Neurological:     Mental Status: She is alert and oriented to person, place, and time.  Psychiatric:        Behavior: Behavior normal.     ED Results / Procedures / Treatments   Labs (all labs ordered are listed, but only abnormal results are displayed) Labs Reviewed  CBC WITH DIFFERENTIAL/PLATELET - Abnormal; Notable for the following components:      Result Value   WBC 23.3 (*)    Hemoglobin 11.8 (*)    HCT 35.5 (*)    Neutro Abs 18.1 (*)    Monocytes Absolute 1.4 (*)    Abs Immature Granulocytes 0.10 (*)    All other components within normal limits  BASIC METABOLIC PANEL - Abnormal; Notable for the following components:   Sodium 134 (*)    Chloride 94 (*)    Glucose, Bld 186 (*)    BUN 51 (*)    Creatinine, Ser 1.30 (*)    GFR, Estimated 43 (*)    All other components within normal limits  URINALYSIS, ROUTINE W REFLEX MICROSCOPIC - Abnormal; Notable for the following components:   Color, Urine AMBER (*)    APPearance TURBID (*)    Hgb urine  dipstick LARGE (*)    Protein, ur >=300 (*)    Leukocytes,Ua MODERATE (*)    RBC / HPF >50 (*)    WBC, UA >50 (*)    Bacteria, UA MANY (*)    All other components within normal limits  LACTIC ACID, PLASMA - Abnormal; Notable for the following components:   Lactic Acid, Venous 2.5 (*)    All other components within normal limits  CBG MONITORING, ED - Abnormal; Notable for the following components:   Glucose-Capillary 174 (*)    All other components within normal limits  CULTURE, BLOOD (ROUTINE X 2)  CULTURE, BLOOD (ROUTINE X 2)  URINE CULTURE  LACTIC ACID, PLASMA    EKG None  Radiology CT ABDOMEN PELVIS W CONTRAST  Result Date: 10/18/2022 CLINICAL DATA:  Abdominal pain, acute, nonlocalized. EXAM: CT ABDOMEN AND PELVIS WITH CONTRAST TECHNIQUE: Multidetector CT imaging of the abdomen and pelvis was performed using the standard protocol following bolus administration of intravenous contrast. RADIATION DOSE REDUCTION: This exam was performed according to the departmental dose-optimization program which includes automated exposure control, adjustment of the mA and/or kV according to patient size and/or use of iterative reconstruction technique. CONTRAST:  103mL OMNIPAQUE IOHEXOL 350 MG/ML SOLN COMPARISON:  None Available. FINDINGS: Lower chest: Mild bibasilar atelectasis. 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 Extensive multi-vessel coronary artery calcification. Global cardiac size within normal limits. No pericardial effusion. Hepatobiliary: No focal liver abnormality is seen. Status post cholecystectomy. No biliary dilatation. Pancreas: Atrophic but otherwise unremarkable. Spleen: Unremarkable Adrenals/Urinary Tract: The adrenal glands are unremarkable. The kidneys are normal in size and position. Mild bilateral hydronephrosis. There is superimposed urothelial enhancement in keeping with inflammation. No intrarenal or ureteral calculi. No perinephric fluid collections. No  enhancing intrarenal masses. The bladder is markedly thick walled, hyperemic, and demonstrates extensive perivesicular inflammatory stranding in keeping with changes a diffuse infectious or inflammatory cystitis. Foley catheter balloon is seen within the bladder lumen which is largely decompressed. No perivesicular fluid collections are identified. Stomach/Bowel: The stomach, small bowel, and large bowel are  unremarkable save for mild descending colonic diverticulosis. There is severe circumferential bowel wall thickening involving the mid and distal rectum as well as extensive perirectal edema suggesting changes of a infectious or inflammatory proctitis. No evidence of obstruction. No free intraperitoneal gas or fluid. The appendix is normal. Vascular/Lymphatic: Aortic atherosclerosis. No enlarged abdominal or pelvic lymph nodes. Reproductive: Prostate is unremarkable. Other: Moderate fat containing supraumbilical ventral hernia is present. Tiny fat containing right inguinal hernia. Musculoskeletal: Probable L4 vertebral hemangioma. No suspicious lytic or blastic bone lesions are identified. No acute bone abnormality. IMPRESSION: 1. Changes in keeping with severe infectious or inflammatory cystitis. Foley catheter balloon is seen within the bladder lumen which is largely decompressed. No perivesicular fluid collections are identified. 2. Mild bilateral hydronephrosis with superimposed urothelial enhancement in keeping with inflammation, possibly reflecting an ascending urinary tract infection given the findings within the bladder. No intrarenal or ureteral calculi. 3. Severe circumferential bowel wall thickening involving the mid and distal rectum as well as extensive perirectal edema suggesting changes of a infectious or inflammatory proctitis. No obstruction or perforation. 4. Extensive multi-vessel coronary artery calcification. 5. Aortic atherosclerosis. Aortic Atherosclerosis (ICD10-I70.0). Electronically  Signed   By: Helyn Numbers M.D.   On: 10/18/2022 01:07   DG Chest 2 View  Result Date: 10/18/2022 CLINICAL DATA:  Hypoxia. EXAM: CHEST - 2 VIEW COMPARISON:  [id number]. FINDINGS: The heart size and mediastinal contours are within normal limits. A TAVR stent is noted. There is atherosclerotic calcification of the aorta. Both lungs are clear. Degenerative changes are present in the thoracic spine. No acute osseous abnormality. IMPRESSION: No active cardiopulmonary disease. Electronically Signed   By: Thornell Sartorius M.D.   On: 10/18/2022 00:01    Procedures .Critical Care  Performed by: Antony Madura, PA-C Authorized by: Antony Madura, PA-C   Critical care provider statement:    Critical care time (minutes):  30   Critical care time was exclusive of:  Separately billable procedures and treating other patients   Critical care was necessary to treat or prevent imminent or life-threatening deterioration of the following conditions:  Sepsis   Critical care was time spent personally by me on the following activities:  Development of treatment plan with patient or surrogate, discussions with consultants, evaluation of patient's response to treatment, examination of patient, ordering and review of laboratory studies, ordering and review of radiographic studies, ordering and performing treatments and interventions, pulse oximetry, re-evaluation of patient's condition and review of old charts   I assumed direction of critical care for this patient from another provider in my specialty: no     Care discussed with: admitting provider       Medications Ordered in ED Medications  metroNIDAZOLE (FLAGYL) IVPB 500 mg (500 mg Intravenous New Bag/Given 10/18/22 0153)  acetaminophen (TYLENOL) tablet 650 mg (650 mg Oral Given 10/18/22 0051)  lactated ringers bolus 1,000 mL (0 mLs Intravenous Stopped 10/18/22 0121)  iohexol (OMNIPAQUE) 350 MG/ML injection 75 mL (75 mLs Intravenous Contrast Given 10/18/22 0045)   ceFEPIme (MAXIPIME) 2 g in sodium chloride 0.9 % 100 mL IVPB (0 g Intravenous Stopped 10/18/22 0130)    ED Course/ Medical Decision Making/ A&P Clinical Course as of 10/18/22 0153  Fri Oct 18, 2022  0034 Patient with temp of 100.68F rectally. With WBC of 23.3, blood cultures added. Tylenol ordered for fever. Patient with mild AKI with creatinine of 1.3, up from baseline of ~0.8. IVF ordered. [KH]  0111 CT findings c/w ascending cystitis/pyelonephritis. Patient meets sepsis criteria,  has already been given outpatient IM Rocephin. Plan for admission for more close monitoring, continued IV abx. Urine culture ordered and results will need to be followed. [KH]  0140 CT re-reviewed with radiology read noting findings of proctitis. Will add flagyl to ordered Cefepime. [KH]  9211 Spoke with Dr. Loney Loh who will assess the patient in the ED for admission. [KH]    Clinical Course User Index [KH] Antony Madura, PA-C                           Medical Decision Making Amount and/or Complexity of Data Reviewed Labs: ordered. Radiology: ordered.  Risk OTC drugs. Prescription drug management. Decision regarding hospitalization.   This patient presents to the ED for concern of AMS/lethargy, this involves an extensive number of treatment options, and is a complaint that carries with it a high risk of complications and morbidity.  The differential diagnosis includes sepsis vs hypoxemia vs DKA vs hypoglycemia vs UTI vs metabolic encephalopathy vs ICH   Co morbidities that complicate the patient evaluation  DM HTN CKD   Additional history obtained:  Additional history obtained from EMS External records from outside source obtained and reviewed including prior creatinine values   Lab Tests:  I Ordered, and personally interpreted labs.  The pertinent results include:  WBC 23.3 with left shift. AKI with creatinine of 1.3 (baseline ~0.8). UA c/w hemorrhagic cystitis (bacteriuria, pyuria,  hematuria). Lactic acid 2.5.   Imaging Studies ordered:  I ordered imaging studies including CT abdomen/pelvis and CXR  I independently visualized and interpreted imaging which showed no acute cardiopulmonary abnormality on CXR. CT scan with findings of cystitis/pyelonephritis as well as proctitis. I agree with the radiologist interpretation   Cardiac Monitoring:  The patient was maintained on a cardiac monitor.  I personally viewed and interpreted the cardiac monitored which showed an underlying rhythm of: NSR   Medicines ordered and prescription drug management:  I ordered medication including IV Cefepime and Flagyl for cystitis and proctitis seen on CT; IVF initiated  Reevaluation of the patient after these medicines showed that the patient  remained stable I have reviewed the patients home medicines and have made adjustments as needed   Test Considered:  Head CT - however, patient not acutely altered on exam and is without focal neurologic deficit.   Critical Interventions:  Initiation of IV abx for sepsis   Consultations Obtained:  I requested consultation with the hospitalist service, and discussed lab and imaging findings as well as pertinent plan - they agree with admission   Problem List / ED Course:  As above   Reevaluation:  After the interventions noted above, I reevaluated the patient and found that they have :stayed the same   Social Determinants of Health:  SNF resident   Dispostion:  After consideration of the diagnostic results and the patients response to treatment, I feel that the patent would benefit from admission for continued management of sepsis 2/2 cystitis/pyelonephritis. IV abx initiated. Dr. Loney Loh of [facility] to admit.          Final Clinical Impression(s) / ED Diagnoses Final diagnoses:  Sepsis, due to unspecified organism, unspecified whether acute organ dysfunction present [facility])  Pyelonephritis  AKI (acute kidney injury)  [facility])    Rx / DC Orders ED Discharge Orders     None         Antony Madura, PA-C 10/18/22 0154    Dione Booze, MD 10/18/22 [phone]

## 2022-10-17 NOTE — ED Provider Notes (Incomplete)
[facility] EMERGENCY DEPARTMENT Provider Note   CSN: [id number] Arrival date & time: 10/17/22  2233     History {Add pertinent medical, surgical, social history, OB history to HPI:1} Chief Complaint  Patient presents with  . UTI    [name] is a 76 y.o. female.  76 y/o female with hx of DM, HTN, HLD, CKD presents to the ED from [facility] for increased lethargy and intermittent confusion. Per facility reports, patient was diagnosed with a UTI a few days ago for which she was worse giving IM Rocephin.  She had labs completed at the facility today given intermittent confusion despite antibiotics.  This showed a WBC of 23.1. She was documented to have been hypoxic down to 87% on RA, but has no chronic O2 requirement. No reported fevers. Patient has c/o some vague abdominal pain as well as pain to her bottom from "sitting too long".   The history is provided by the patient. No language interpreter was used.       Home Medications Prior to Admission medications   Medication Sig Start Date End Date Taking? Authorizing Provider  acetaminophen (TYLENOL) 500 MG tablet Take 1 tablet (500 mg total) by mouth every 6 (six) hours as needed. 05/22/22   Pokhrel, Rebekah Chesterfield, MD  ASPERCREME LIDOCAINE EX Apply 1 application  topically as needed (pain).    [provider]  aspirin 81 MG tablet Take 1 tablet (81 mg total) by mouth daily. Start on July 14,2023 after completing full dose aspirin 06/21/22   Pokhrel, Rebekah Chesterfield, MD  atorvastatin (LIPITOR) 80 MG tablet Take 1 tablet (80 mg total) by mouth daily. 03/29/22   Myrlene Broker, MD  citalopram (CELEXA) 20 MG tablet Take 1 tablet (20 mg total) by mouth daily. 10/04/21   Myrlene Broker, MD  collagenase (SANTYL) 250 UNIT/GM ointment Apply 1 application. topically 3 (three) times daily. Apply to sacrum 05/02/22   [name], [name], NP  Continuous Blood Gluc Sensor (FREESTYLE LIBRE SENSOR SYSTEM) MISC Use to  monitor sugars 05/02/22   [name], [name], NP  esomeprazole (NEXIUM) 20 MG capsule Take 1 capsule (20 mg total) by mouth daily at 12 noon. 03/07/20   Myrlene Broker, MD  ezetimibe (ZETIA) 10 MG tablet TAKE 1 TABLET BY MOUTH  DAILY Patient taking differently: Take 10 mg by mouth daily. 02/05/22   Myrlene Broker, MD  gabapentin (NEURONTIN) 300 MG capsule TAKE 1 CAPSULE BY MOUTH 3  TIMES DAILY Patient taking differently: Take 300 mg by mouth 3 (three) times daily. 04/12/22   Myrlene Broker, MD  Insulin Pen Needle 31G X 8 MM MISC Use to inject insulin four times daily E11.41 05/02/22   [name], [name], NP  metFORMIN (GLUCOPHAGE) 1000 MG tablet Take 500 mg by mouth 2 (two) times daily with a meal.    [provider]  metoprolol tartrate (LOPRESSOR) 25 MG tablet Take 0.5 tablets (12.5 mg total) by mouth 2 (two) times daily. 10/04/21   Myrlene Broker, MD  Naphazoline HCl (CLEAR EYES OP) Place 1 drop into both eyes daily as needed (dry eyes/itching).    [provider]  nystatin (MYCOSTATIN/NYSTOP) powder Apply 1 application. topically 3 (three) times daily. Patient taking differently: Apply 1 application  topically 2 (two) times daily. 04/24/22   [name], Donalee Citrin, NP  ONETOUCH VERIO test strip USE AS DIRECTED 03/13/22   Myrlene Broker, MD  oxyCODONE (OXY IR/ROXICODONE) 5 MG immediate release tablet Take  1 tablet (5 mg total) by mouth every 6 (six) hours as needed for moderate pain or severe pain. 05/22/22   Pokhrel, Laxman, MD  Semaglutide (RYBELSUS) 14 MG TABS Take 14 mg by mouth daily. 10/04/21   Myrlene Broker, MD  silver sulfADIAZINE (SILVADENE) 1 % cream Apply 1 application topically daily. 01/01/22   Myrlene Broker, MD  sodium chloride (OCEAN) 0.65 % SOLN nasal spray Place 1 spray into both nostrils 4 (four) times daily. 03/07/20   Myrlene Broker, MD  torsemide (DEMADEX) 20 MG tablet Take 1 tablet (20 mg total) by mouth daily. 10/04/21    Myrlene Broker, MD  TRESIBA FLEXTOUCH 200 UNIT/ML FlexTouch Pen INJECT SUBCUTANEOUSLY 44 UNITS  AT BEDTIME 06/28/22   [name], [name], NP      Allergies    Morphine, Codeine sulfate, Demerol [meperidine], and Propoxyphene hcl    Review of Systems   Review of Systems Ten systems reviewed and are negative for acute change, except as noted in the HPI.    Physical Exam Updated Vital Signs BP 101/65   Pulse 80   Temp 99.1 F (37.3 C) (Oral)   Resp 12   SpO2 95%   Physical Exam Vitals and nursing note reviewed.  Constitutional:      General: She is not in acute distress.    Appearance: She is well-developed. She is not diaphoretic.     Comments: Chronically ill appearing, obese female  HENT:     Head: Normocephalic and atraumatic.  Eyes:     General: No scleral icterus.    Conjunctiva/sclera: Conjunctivae normal.  Cardiovascular:     Rate and Rhythm: Normal rate and regular rhythm.     Pulses: Normal pulses.  Pulmonary:     Effort: Pulmonary effort is normal. No respiratory distress.     Breath sounds: No wheezing or rhonchi.     Comments: Respirations even and unlabored. Lungs CTAB. Abdominal:     Palpations: Abdomen is soft.     Tenderness: There is abdominal tenderness. There is no guarding.     Comments: Abdomen soft, obese, nondistended. Generalized TTP without peritoneal signs or palpable masses.  Genitourinary:    Comments: No significant sacral wound on exam. Musculoskeletal:        General: Normal range of motion.     Cervical back: Normal range of motion.  Skin:    General: Skin is warm and dry.     Coloration: Skin is not pale.     Findings: No erythema or rash.  Neurological:     Mental Status: She is alert and oriented to person, place, and time.  Psychiatric:        Behavior: Behavior normal.     ED Results / Procedures / Treatments   Labs (all labs ordered are listed, but only abnormal results are displayed) Labs Reviewed  CBC WITH  DIFFERENTIAL/PLATELET - Abnormal; Notable for the following components:      Result Value   WBC 23.3 (*)    Hemoglobin 11.8 (*)    HCT 35.5 (*)    Neutro Abs 18.1 (*)    Monocytes Absolute 1.4 (*)    Abs Immature Granulocytes 0.10 (*)    All other components within normal limits  BASIC METABOLIC PANEL  URINALYSIS, ROUTINE W REFLEX MICROSCOPIC  LACTIC ACID, PLASMA  LACTIC ACID, PLASMA  CBG MONITORING, ED    EKG None  Radiology No results found.  Procedures Procedures  {Document cardiac monitor, telemetry assessment procedure  when appropriate:1}  Medications Ordered in ED Medications - No data to display  ED Course/ Medical Decision Making/ A&P                           Medical Decision Making Amount and/or Complexity of Data Reviewed Labs: ordered. Radiology: ordered.   ***  {Document critical care time when appropriate:1} {Document review of labs and clinical decision tools ie heart score, Chads2Vasc2 etc:1}  {Document your independent review of radiology images, and any outside records:1} {Document your discussion with family members, caretakers, and with consultants:1} {Document social determinants of health affecting pt's care:1} {Document your decision making why or why not admission, treatments were needed:1} Final Clinical Impression(s) / ED Diagnoses Final diagnoses:  None    Rx / DC Orders ED Discharge Orders     None

## 2022-10-17 NOTE — ED Triage Notes (Signed)
Patient arrived with EMS from [facility] , currently taking oral antibiotic for UTI , staff reported intermittent confusion / thick malodorous urine this week .

## 2022-10-18 ENCOUNTER — Inpatient Hospital Stay (HOSPITAL_COMMUNITY): Payer: Medicare Other

## 2022-10-18 ENCOUNTER — Encounter (HOSPITAL_COMMUNITY): Payer: Self-pay | Admitting: Internal Medicine

## 2022-10-18 ENCOUNTER — Emergency Department (HOSPITAL_COMMUNITY): Payer: Medicare Other

## 2022-10-18 DIAGNOSIS — E669 Obesity, unspecified: Secondary | ICD-10-CM | POA: Diagnosis present

## 2022-10-18 DIAGNOSIS — N12 Tubulo-interstitial nephritis, not specified as acute or chronic: Secondary | ICD-10-CM | POA: Diagnosis present

## 2022-10-18 DIAGNOSIS — A419 Sepsis, unspecified organism: Secondary | ICD-10-CM | POA: Insufficient documentation

## 2022-10-18 DIAGNOSIS — Z66 Do not resuscitate: Secondary | ICD-10-CM | POA: Diagnosis present

## 2022-10-18 DIAGNOSIS — I110 Hypertensive heart disease with heart failure: Secondary | ICD-10-CM | POA: Diagnosis present

## 2022-10-18 DIAGNOSIS — L89623 Pressure ulcer of left heel, stage 3: Secondary | ICD-10-CM | POA: Diagnosis present

## 2022-10-18 DIAGNOSIS — N179 Acute kidney failure, unspecified: Secondary | ICD-10-CM | POA: Diagnosis present

## 2022-10-18 DIAGNOSIS — E785 Hyperlipidemia, unspecified: Secondary | ICD-10-CM

## 2022-10-18 DIAGNOSIS — E871 Hypo-osmolality and hyponatremia: Secondary | ICD-10-CM | POA: Diagnosis present

## 2022-10-18 DIAGNOSIS — I10 Essential (primary) hypertension: Secondary | ICD-10-CM | POA: Diagnosis not present

## 2022-10-18 DIAGNOSIS — Z794 Long term (current) use of insulin: Secondary | ICD-10-CM | POA: Diagnosis not present

## 2022-10-18 DIAGNOSIS — I5030 Unspecified diastolic (congestive) heart failure: Secondary | ICD-10-CM | POA: Insufficient documentation

## 2022-10-18 DIAGNOSIS — I5032 Chronic diastolic (congestive) heart failure: Secondary | ICD-10-CM | POA: Diagnosis present

## 2022-10-18 DIAGNOSIS — I959 Hypotension, unspecified: Secondary | ICD-10-CM | POA: Diagnosis not present

## 2022-10-18 DIAGNOSIS — B9620 Unspecified Escherichia coli [E. coli] as the cause of diseases classified elsewhere: Secondary | ICD-10-CM | POA: Diagnosis present

## 2022-10-18 DIAGNOSIS — G9341 Metabolic encephalopathy: Secondary | ICD-10-CM | POA: Diagnosis present

## 2022-10-18 DIAGNOSIS — J9601 Acute respiratory failure with hypoxia: Secondary | ICD-10-CM | POA: Insufficient documentation

## 2022-10-18 DIAGNOSIS — R6520 Severe sepsis without septic shock: Secondary | ICD-10-CM | POA: Diagnosis present

## 2022-10-18 DIAGNOSIS — N390 Urinary tract infection, site not specified: Secondary | ICD-10-CM | POA: Diagnosis not present

## 2022-10-18 DIAGNOSIS — N3090 Cystitis, unspecified without hematuria: Secondary | ICD-10-CM | POA: Diagnosis present

## 2022-10-18 DIAGNOSIS — K6289 Other specified diseases of anus and rectum: Secondary | ICD-10-CM | POA: Insufficient documentation

## 2022-10-18 DIAGNOSIS — E1142 Type 2 diabetes mellitus with diabetic polyneuropathy: Secondary | ICD-10-CM | POA: Diagnosis present

## 2022-10-18 DIAGNOSIS — T83511A Infection and inflammatory reaction due to indwelling urethral catheter, initial encounter: Secondary | ICD-10-CM | POA: Diagnosis present

## 2022-10-18 DIAGNOSIS — D649 Anemia, unspecified: Secondary | ICD-10-CM | POA: Diagnosis present

## 2022-10-18 DIAGNOSIS — I471 Supraventricular tachycardia, unspecified: Secondary | ICD-10-CM | POA: Diagnosis not present

## 2022-10-18 DIAGNOSIS — Z1612 Extended spectrum beta lactamase (ESBL) resistance: Secondary | ICD-10-CM | POA: Diagnosis present

## 2022-10-18 DIAGNOSIS — E1122 Type 2 diabetes mellitus with diabetic chronic kidney disease: Secondary | ICD-10-CM | POA: Diagnosis present

## 2022-10-18 DIAGNOSIS — R4182 Altered mental status, unspecified: Secondary | ICD-10-CM | POA: Insufficient documentation

## 2022-10-18 DIAGNOSIS — Z952 Presence of prosthetic heart valve: Secondary | ICD-10-CM | POA: Diagnosis not present

## 2022-10-18 DIAGNOSIS — Z79899 Other long term (current) drug therapy: Secondary | ICD-10-CM | POA: Diagnosis not present

## 2022-10-18 DIAGNOSIS — F32A Depression, unspecified: Secondary | ICD-10-CM | POA: Diagnosis present

## 2022-10-18 LAB — CBC
HCT: 26.6 % — ABNORMAL LOW (ref 36.0–46.0)
Hemoglobin: 8.8 g/dL — ABNORMAL LOW (ref 12.0–15.0)
MCH: 28.4 pg (ref 26.0–34.0)
MCHC: 33.1 g/dL (ref 30.0–36.0)
MCV: 85.8 fL (ref 80.0–100.0)
Platelets: 169 10*3/uL (ref 150–400)
RBC: 3.1 MIL/uL — ABNORMAL LOW (ref 3.87–5.11)
RDW: 15 % (ref 11.5–15.5)
WBC: 17.3 10*3/uL — ABNORMAL HIGH (ref 4.0–10.5)
nRBC: 0 % (ref 0.0–0.2)

## 2022-10-18 LAB — GLUCOSE, CAPILLARY
Glucose-Capillary: 171 mg/dL — ABNORMAL HIGH (ref 70–99)
Glucose-Capillary: 148 mg/dL — ABNORMAL HIGH (ref 70–99)
Glucose-Capillary: 136 mg/dL — ABNORMAL HIGH (ref 70–99)

## 2022-10-18 LAB — URINALYSIS, ROUTINE W REFLEX MICROSCOPIC
Bilirubin Urine: NEGATIVE
Glucose, UA: NEGATIVE mg/dL
Ketones, ur: NEGATIVE mg/dL
Nitrite: NEGATIVE
Protein, ur: >=300 mg/dL — AB
RBC / HPF: >50 RBC/hpf — ABNORMAL HIGH (ref 0–5)
Specific Gravity, Urine: 1.017 (ref 1.005–1.030)
WBC, UA: >50 WBC/hpf — ABNORMAL HIGH (ref 0–5)
pH: 5 (ref 5.0–8.0)

## 2022-10-18 LAB — CBG MONITORING, ED
Glucose-Capillary: 106 mg/dL — ABNORMAL HIGH (ref 70–99)
Glucose-Capillary: 114 mg/dL — ABNORMAL HIGH (ref 70–99)
Glucose-Capillary: 129 mg/dL — ABNORMAL HIGH (ref 70–99)
Glucose-Capillary: 174 mg/dL — ABNORMAL HIGH (ref 70–99)

## 2022-10-18 LAB — BASIC METABOLIC PANEL
Anion gap: 7 (ref 5–15)
BUN: 45 mg/dL — ABNORMAL HIGH (ref 8–23)
CO2: 26 mmol/L (ref 22–32)
Calcium: 7.8 mg/dL — ABNORMAL LOW (ref 8.9–10.3)
Chloride: 98 mmol/L (ref 98–111)
Creatinine, Ser: 0.89 mg/dL (ref 0.44–1.00)
GFR, Estimated: >60 mL/min (ref >60)
Glucose, Bld: 134 mg/dL — ABNORMAL HIGH (ref 70–99)
Potassium: 3.2 mmol/L — ABNORMAL LOW (ref 3.5–5.1)
Sodium: 131 mmol/L — ABNORMAL LOW (ref 135–145)

## 2022-10-18 LAB — LACTIC ACID, PLASMA
Lactic Acid, Venous: 2.5 mmol/L (ref 0.5–1.9)
Lactic Acid, Venous: 1.6 mmol/L (ref 0.5–1.9)

## 2022-10-18 LAB — HEMOGLOBIN A1C
Hgb A1c MFr Bld: 6.5 % — ABNORMAL HIGH (ref 4.8–5.6)
Mean Plasma Glucose: 139.85 mg/dL

## 2022-10-18 MED ORDER — ENOXAPARIN SODIUM 40 MG/0.4ML IJ SOSY
40.0000 mg | PREFILLED_SYRINGE | Freq: Every day | INTRAMUSCULAR | Status: DC
  Administered 2022-10-18 – 2022-10-24 (×7): 40 mg via SUBCUTANEOUS
  Filled 2022-10-18 (×7): qty 0.4

## 2022-10-18 MED ORDER — PANTOPRAZOLE SODIUM 40 MG PO TBEC
40.0000 mg | DELAYED_RELEASE_TABLET | Freq: Every day | ORAL | Status: DC
  Administered 2022-10-18 – 2022-10-24 (×7): 40 mg via ORAL
  Filled 2022-10-18 (×7): qty 1

## 2022-10-18 MED ORDER — INSULIN ASPART 100 UNIT/ML IJ SOLN
0.0000 [IU] | INTRAMUSCULAR | Status: DC
  Administered 2022-10-18 (×2): 1 [IU] via SUBCUTANEOUS
  Administered 2022-10-18: 2 [IU] via SUBCUTANEOUS
  Administered 2022-10-19 – 2022-10-20 (×3): 1 [IU] via SUBCUTANEOUS
  Administered 2022-10-20: 2 [IU] via SUBCUTANEOUS
  Administered 2022-10-20: 1 [IU] via SUBCUTANEOUS
  Administered 2022-10-21: 2 [IU] via SUBCUTANEOUS
  Administered 2022-10-21: 3 [IU] via SUBCUTANEOUS
  Administered 2022-10-21: 2 [IU] via SUBCUTANEOUS
  Administered 2022-10-21 (×2): 1 [IU] via SUBCUTANEOUS
  Administered 2022-10-22: 2 [IU] via SUBCUTANEOUS
  Administered 2022-10-22: 9 [IU] via SUBCUTANEOUS
  Administered 2022-10-22: 2 [IU] via SUBCUTANEOUS
  Administered 2022-10-22: 1 [IU] via SUBCUTANEOUS
  Administered 2022-10-22: 2 [IU] via SUBCUTANEOUS
  Administered 2022-10-23: 1 [IU] via SUBCUTANEOUS
  Administered 2022-10-23: 2 [IU] via SUBCUTANEOUS
  Administered 2022-10-23: 1 [IU] via SUBCUTANEOUS
  Administered 2022-10-23: 2 [IU] via SUBCUTANEOUS
  Administered 2022-10-23: 1 [IU] via SUBCUTANEOUS
  Administered 2022-10-24: 3 [IU] via SUBCUTANEOUS
  Administered 2022-10-24: 1 [IU] via SUBCUTANEOUS

## 2022-10-18 MED ORDER — SODIUM CHLORIDE 0.9 % IV SOLN
2.0000 g | INTRAVENOUS | Status: DC
  Administered 2022-10-19 – 2022-10-20 (×2): 2 g via INTRAVENOUS
  Filled 2022-10-18 (×2): qty 12.5

## 2022-10-18 MED ORDER — SODIUM CHLORIDE 0.9 % IV SOLN: INTRAVENOUS | Status: AC

## 2022-10-18 MED ORDER — ACETAMINOPHEN 325 MG PO TABS
650.0000 mg | ORAL_TABLET | Freq: Four times a day (QID) | ORAL | Status: DC | PRN
  Administered 2022-10-18 – 2022-10-23 (×4): 650 mg via ORAL
  Filled 2022-10-18 (×4): qty 2

## 2022-10-18 MED ORDER — CHLORHEXIDINE GLUCONATE CLOTH 2 % EX PADS
6.0000 | MEDICATED_PAD | Freq: Every day | CUTANEOUS | Status: DC
  Administered 2022-10-18 – 2022-10-24 (×6): 6 via TOPICAL

## 2022-10-18 MED ORDER — ACETAMINOPHEN 650 MG RE SUPP: 650.0000 mg | Freq: Four times a day (QID) | RECTAL | Status: DC | PRN

## 2022-10-18 MED ORDER — METOPROLOL TARTRATE 25 MG PO TABS: 12.5000 mg | ORAL_TABLET | Freq: Two times a day (BID) | ORAL | Status: DC

## 2022-10-18 MED ORDER — LACTATED RINGERS IV BOLUS
1000.0000 mL | Freq: Once | INTRAVENOUS | Status: AC
  Administered 2022-10-18: 1000 mL via INTRAVENOUS

## 2022-10-18 MED ORDER — POTASSIUM CHLORIDE CRYS ER 20 MEQ PO TBCR
40.0000 meq | EXTENDED_RELEASE_TABLET | Freq: Once | ORAL | Status: AC
  Administered 2022-10-18: 40 meq via ORAL
  Filled 2022-10-18: qty 2

## 2022-10-18 MED ORDER — IOHEXOL 350 MG/ML SOLN
75.0000 mL | Freq: Once | INTRAVENOUS | Status: AC | PRN
  Administered 2022-10-18: 75 mL via INTRAVENOUS

## 2022-10-18 MED ORDER — GABAPENTIN 300 MG PO CAPS
300.0000 mg | ORAL_CAPSULE | Freq: Three times a day (TID) | ORAL | Status: DC
  Administered 2022-10-18 – 2022-10-24 (×18): 300 mg via ORAL
  Filled 2022-10-18 (×18): qty 1

## 2022-10-18 MED ORDER — METRONIDAZOLE 500 MG/100ML IV SOLN
500.0000 mg | Freq: Two times a day (BID) | INTRAVENOUS | Status: DC
  Administered 2022-10-18 – 2022-10-19 (×3): 500 mg via INTRAVENOUS
  Filled 2022-10-18 (×3): qty 100

## 2022-10-18 MED ORDER — EZETIMIBE 10 MG PO TABS
10.0000 mg | ORAL_TABLET | Freq: Every day | ORAL | Status: DC
  Administered 2022-10-18 – 2022-10-24 (×7): 10 mg via ORAL
  Filled 2022-10-18 (×7): qty 1

## 2022-10-18 MED ORDER — ACETAMINOPHEN 325 MG PO TABS
650.0000 mg | ORAL_TABLET | Freq: Once | ORAL | Status: AC
  Administered 2022-10-18: 650 mg via ORAL
  Filled 2022-10-18: qty 2

## 2022-10-18 MED ORDER — ATORVASTATIN CALCIUM 80 MG PO TABS
80.0000 mg | ORAL_TABLET | Freq: Every day | ORAL | Status: DC
  Administered 2022-10-18 – 2022-10-24 (×7): 80 mg via ORAL
  Filled 2022-10-18 (×5): qty 1
  Filled 2022-10-18: qty 2
  Filled 2022-10-18: qty 1

## 2022-10-18 MED ORDER — INSULIN GLARGINE-YFGN 100 UNIT/ML ~~LOC~~ SOLN
14.0000 [IU] | Freq: Every day | SUBCUTANEOUS | Status: DC
  Administered 2022-10-18 – 2022-10-23 (×6): 14 [IU] via SUBCUTANEOUS
  Filled 2022-10-18 (×10): qty 0.14

## 2022-10-18 MED ORDER — ORAL CARE MOUTH RINSE: 15.0000 mL | OROMUCOSAL | Status: DC | PRN

## 2022-10-18 MED ORDER — SODIUM CHLORIDE 0.9 % IV SOLN
2.0000 g | Freq: Once | INTRAVENOUS | Status: AC
  Administered 2022-10-18: 2 g via INTRAVENOUS
  Filled 2022-10-18: qty 12.5

## 2022-10-18 MED ORDER — CITALOPRAM HYDROBROMIDE 20 MG PO TABS
20.0000 mg | ORAL_TABLET | Freq: Every day | ORAL | Status: DC
  Administered 2022-10-18 – 2022-10-24 (×7): 20 mg via ORAL
  Filled 2022-10-18 (×6): qty 1
  Filled 2022-10-18: qty 2

## 2022-10-18 NOTE — Progress Notes (Signed)
Pharmacy Antibiotic Note  [name] is a 76 y.o. female admitted on 10/17/2022 with UTI and cystitis.  Pharmacy has been consulted for cefepime dosing.  Renal function has improved to what appears is baseline (sCr 0.8-1). CrCl 50-60 mL/min.  Plan: Change to Cefepime 2g IV Q12H. Monitor renal function for further dose adjustments F/u cultures for de-escalation  Height: 5\' 4"  (162.6 cm) Weight: 83.9 kg (185 lb) (from June 2023 records, please update) IBW/kg (Calculated) : 54.7  Temp (24hrs), Avg:98.9 F (37.2 C), Min:97.2 F (36.2 C), Max:100.4 F (38 C)  Recent Labs  Lab 10/17/22 2300 10/18/22 0140 10/18/22 0413  WBC 23.3*  --  17.3*  CREATININE 1.30*  --  0.89  LATICACIDVEN 2.5* 1.6  --      Estimated Creatinine Clearance: 57.3 mL/min (by C-G formula based on SCr of 0.89 mg/dL).    Allergies  Allergen Reactions   Morphine Other (See Comments)    'took me out of this world' I had to be resuscitated   Codeine Sulfate Nausea Only and Other (See Comments)    GI upset and pain. "When to sleep and did not wake up".    Demerol [Meperidine] Itching, Nausea And Vomiting and Swelling   Propoxyphene Hcl Other (See Comments)    Darvocet caused sick headache/swelling     Thank you for allowing pharmacy to be a part of this patient's care.  13/10/23, PharmD Clinical Pharmacist 10/18/2022 7:24 AM

## 2022-10-18 NOTE — Progress Notes (Addendum)
PROGRESS NOTE        PATIENT DETAILS Name: [name] Age: 76 y.o. Sex: female Date of Birth: Oct 23, 1946 Admit Date: 10/17/2022 Admitting Physician John Giovanni, MD ZWC:HENIDPO, Donalee Citrin, NP  Brief Summary: Patient is a 76 y.o.  female with history of DM-2, HTN, HLD, aortic stenosis s/p TAVR, HFpEF, chronic lower extremity lymphedema-who was recently diagnosed with UTI at [facility] on IM Rocephin-sent from SNF for worsening confusion-she was thought to have sepsis in the setting of complicated UTI and subsequently admitted to hospitalist service.  Significant events: 11/9>> admit to [facility]-severe sepsis-complicated UTI/proctitis.  Significant studies: 11/09>> CXR: No PNA 11/10>> CT abdomen/pelvis: Cystitis/bilateral hydronephrosis/proctitis without obstruction or perforation. 11/10>> CT head: No acute intracranial abnormality.  Significant microbiology data: 11/10>> blood culture: No growth  Procedures: None  Consults: None  Subjective: Seen earlier this morning-was sleeping but easily awoke-answering simple questions appropriately.  Acknowledges pain in the lower abdomen-and pain when defecating.  Objective: Vitals: Blood pressure (!) 105/54, pulse 80, temperature 97.8 F (36.6 C), temperature source Oral, resp. rate 15, height 5\' 4"  (1.626 m), weight 83.9 kg, SpO2 100 %.   Exam: Gen Exam:Alert awake-not in any distress HEENT:atraumatic, normocephalic Chest: B/L clear to auscultation anteriorly CVS:S1S2 regular Abdomen:soft-slightly tender in lower abdomen.  Benign abdominal exam. Extremities:no edema Neurology: Non focal Skin: no rash  Pertinent Labs/Radiology:    Latest Ref Rng & Units 10/18/2022    4:13 AM 10/17/2022   11:00 PM 05/21/2022    1:45 AM  CBC  WBC 4.0 - 10.5 K/uL 17.3  23.3  13.0   Hemoglobin 12.0 - 15.0 g/dL 8.8  05/23/2022  8.9   Hematocrit 36.0 - 46.0 % 26.6  35.5  28.3   Platelets 150 - 400 K/uL 169  244   129     Lab Results  Component Value Date   NA 131 (L) 10/18/2022   K 3.2 (L) 10/18/2022   CL 98 10/18/2022   CO2 26 10/18/2022     Assessment/Plan: Severe sepsis due to complicated UTI (cystitis with pyelonephritis) Acute proctitis Sepsis physiology-encephalopathy improving Continue cefepime/Flagyl Await culture data-but since patient was already on IV Rocephin-cultures could be potentially steroid.  Acute metabolic encephalopathy Due to severe sepsis Seems to be improved compared to what was described on admission Supportive care Maintain delirium precautions  ?Acute hypoxic respiratory failure Unclear if this was a error She has no respiratory symptoms On minimal amount of oxygen-with O2 saturations around 100% this morning. Wean off oxygen over the next few days.  AKI Hemodynamically mediated AKI has resolved-supportive care for now.  Hypokalemia Replete/recheck  Hyponatremia Mild-clinically insignificant Repeat electrolytes periodically.  Normocytic anemia Chronic issue since June of this year and will likely worsen due to acute illness. No evidence of blood loss Follow CBC.  Chronic HFpEF Stable/euvolemic-chronic lymphedema at baseline. Hold diuretics today as blood pressure somewhat soft.  Chronic bilateral lymphedema Appears to be at baseline Resume diuretics over the next few days  History of aortic stenosis-s/p TAVR 2021  HTN BP soft-hold metoprolol Reassess 11/11  DM-2 (A1c> 14.0 on 5/17) CBGs stable Continue Semglee 14 units daily/SSI Follow/optimize.  Recent Labs    10/18/22 0023 10/18/22 0402 10/18/22 0833  GLUCAP 174* 129* 114*    HLD Resume statin/Zetia  GERD PPI  Peripheral neuropathy Resume Neurontin-since mentation has improved.  Depression Resume Celexa  Frailty/debility/deconditioning PT/OT eval Currently at SNF  Obesity: Estimated body mass index is 31.76 kg/m as calculated from the following:   Height as  of this encounter: 5\' 4"  (1.626 m).   Weight as of this encounter: 83.9 kg.   Additional time spent: 30 minutes  Code status:   Code Status: DNR   DVT Prophylaxis: enoxaparin (LOVENOX) injection 40 mg Start: 10/18/22 0945   Family Communication:  None at bedside-called niece-[name] Freeman-[phone]-left VM   Disposition Plan: Status is: Inpatient Remains inpatient appropriate because: Resolving sepsis physiology-not yet stable for discharge.   Planned Discharge Destination:Skilled nursing facility   Diet: Diet Order     None         Antimicrobial agents: Anti-infectives (From admission, onward)    Start     Dose/Rate Route Frequency Ordered Stop   10/19/22 0000  ceFEPIme (MAXIPIME) 2 g in sodium chloride 0.9 % 100 mL IVPB        2 g 200 mL/hr over 30 Minutes Intravenous Every 24 hours 10/18/22 0431     10/18/22 0145  metroNIDAZOLE (FLAGYL) IVPB 500 mg        500 mg 100 mL/hr over 60 Minutes Intravenous Every 12 hours 10/18/22 0140     10/18/22 0130  ceFEPIme (MAXIPIME) 2 g in sodium chloride 0.9 % 100 mL IVPB        2 g 200 mL/hr over 30 Minutes Intravenous  Once 10/18/22 0115 10/18/22 0130        MEDICATIONS: Scheduled Meds:  atorvastatin  80 mg Oral Daily   citalopram  20 mg Oral Daily   enoxaparin (LOVENOX) injection  40 mg Subcutaneous Q24H   ezetimibe  10 mg Oral Daily   gabapentin  300 mg Oral TID   insulin aspart  0-9 Units Subcutaneous Q4H   insulin glargine-yfgn  14 Units Subcutaneous QHS   pantoprazole  40 mg Oral Daily   Continuous Infusions:  sodium chloride 125 mL/hr at 10/18/22 0435   [START ON 10/19/2022] ceFEPime (MAXIPIME) IV     metronidazole Stopped (10/18/22 0231)   PRN Meds:.acetaminophen **OR** acetaminophen   I have personally reviewed following labs and imaging studies  LABORATORY DATA: CBC: Recent Labs  Lab 10/17/22 2300 10/18/22 0413  WBC 23.3* 17.3*  NEUTROABS 18.1*  --   HGB 11.8* 8.8*  HCT 35.5* 26.6*   MCV 85.3 85.8  PLT 244 169    Basic Metabolic Panel: Recent Labs  Lab 10/17/22 2300 10/18/22 0413  NA 134* 131*  K 4.0 3.2*  CL 94* 98  CO2 30 26  GLUCOSE 186* 134*  BUN 51* 45*  CREATININE 1.30* 0.89  CALCIUM 8.9 7.8*    GFR: Estimated Creatinine Clearance: 57.3 mL/min (by C-G formula based on SCr of 0.89 mg/dL).  Liver Function Tests: No results for input(s): "AST", "ALT", "ALKPHOS", "BILITOT", "PROT", "ALBUMIN" in the last 168 hours. No results for input(s): "LIPASE", "AMYLASE" in the last 168 hours. No results for input(s): "AMMONIA" in the last 168 hours.  Coagulation Profile: No results for input(s): "INR", "PROTIME" in the last 168 hours.  Cardiac Enzymes: No results for input(s): "CKTOTAL", "CKMB", "CKMBINDEX", "TROPONINI" in the last 168 hours.  BNP (last 3 results) No results for input(s): "PROBNP" in the last 8760 hours.  Lipid Profile: No results for input(s): "CHOL", "HDL", "LDLCALC", "TRIG", "CHOLHDL", "LDLDIRECT" in the last 72 hours.  Thyroid Function Tests: No results for input(s): "TSH", "T4TOTAL", "FREET4", "T3FREE", "THYROIDAB" in the last 72 hours.  Anemia  Panel: No results for input(s): "VITAMINB12", "FOLATE", "FERRITIN", "TIBC", "IRON", "RETICCTPCT" in the last 72 hours.  Urine analysis:    Component Value Date/Time   COLORURINE AMBER (A) 10/18/2022 0004   APPEARANCEUR TURBID (A) 10/18/2022 0004   LABSPEC 1.017 10/18/2022 0004   PHURINE 5.0 10/18/2022 0004   GLUCOSEU NEGATIVE 10/18/2022 0004   HGBUR LARGE (A) 10/18/2022 0004   BILIRUBINUR NEGATIVE 10/18/2022 0004   BILIRUBINUR Neg 05/10/2019 1029   KETONESUR NEGATIVE 10/18/2022 0004   PROTEINUR >=300 (A) 10/18/2022 0004   UROBILINOGEN 0.2 05/10/2019 1029   UROBILINOGEN 0.2 12/19/2014 1425   NITRITE NEGATIVE 10/18/2022 0004   LEUKOCYTESUR MODERATE (A) 10/18/2022 0004    Sepsis Labs: Lactic Acid, Venous    Component Value Date/Time   LATICACIDVEN 1.6 10/18/2022 0140     MICROBIOLOGY: Recent Results (from the past 240 hour(s))  Blood culture (routine x 2)     Status: None (Preliminary result)   Collection Time: 10/17/22 11:00 PM   Specimen: BLOOD LEFT ARM  Result Value Ref Range Status   Specimen Description BLOOD LEFT ARM  Final   Special Requests   Final    BOTTLES DRAWN AEROBIC AND ANAEROBIC Blood Culture adequate volume   Culture   Final    NO GROWTH < 12 HOURS Performed at [facility], [address] [place], [place] [address]    Report Status PENDING  Incomplete  Blood culture (routine x 2)     Status: None (Preliminary result)   Collection Time: 10/18/22  1:45 AM   Specimen: BLOOD RIGHT ARM  Result Value Ref Range Status   Specimen Description BLOOD RIGHT ARM  Final   Special Requests   Final    BOTTLES DRAWN AEROBIC AND ANAEROBIC Blood Culture adequate volume   Culture   Final    NO GROWTH < 12 HOURS Performed at [facility], [address] [place], [place] [address]    Report Status PENDING  Incomplete    RADIOLOGY STUDIES/RESULTS: CT HEAD WO CONTRAST ( )  Result Date: 10/18/2022 CLINICAL DATA:  76 year old female with delirium. EXAM: CT HEAD WITHOUT CONTRAST TECHNIQUE: Contiguous axial images were obtained from the base of the skull through the vertex without intravenous contrast. RADIATION DOSE REDUCTION: This exam was performed according to the departmental dose-optimization program which includes automated exposure control, adjustment of the mA and/or kV according to patient size and/or use of iterative reconstruction technique. COMPARISON:  Brain MRI 12/20/2014.  Head CT 05/19/2022. FINDINGS: Brain: Stable cerebral volume. No midline shift, ventriculomegaly, mass effect, evidence of mass lesion, intracranial hemorrhage or evidence of cortically based acute infarction. Gray-white differentiation throughout the brain appears stable and largely normal for age. No cortical encephalomalacia identified. Vascular:  Calcified atherosclerosis at the skull base. No suspicious intracranial vascular hyperdensity. Skull: Stable and negative. Sinuses/Orbits: Trace paranasal sinus fluid levels and mucosal thickening. Tympanic cavities and mastoids remain clear. Small volume of retained secretions in the visible nasopharynx. Other: No acute orbit or scalp soft tissue finding. IMPRESSION: 1. No acute intracranial abnormality. Stable and negative for age noncontrast CT appearance of the brain. 2. Trace paranasal sinus inflammation, significance doubtful. Electronically Signed   By: Odessa Fleming M.D.   On: 10/18/2022 05:34   CT ABDOMEN PELVIS W CONTRAST  Result Date: 10/18/2022 CLINICAL DATA:  Abdominal pain, acute, nonlocalized. EXAM: CT ABDOMEN AND PELVIS WITH CONTRAST TECHNIQUE: Multidetector CT imaging of the abdomen and pelvis was performed using the standard protocol following bolus administration of intravenous contrast.  RADIATION DOSE REDUCTION: This exam was performed according to the departmental dose-optimization program which includes automated exposure control, adjustment of the mA and/or kV according to patient size and/or use of iterative reconstruction technique. CONTRAST:  42mL OMNIPAQUE IOHEXOL 350 MG/ML SOLN COMPARISON:  None Available. FINDINGS: Lower chest: Mild bibasilar atelectasis. 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 Extensive multi-vessel coronary artery calcification. Global cardiac size within normal limits. No pericardial effusion. Hepatobiliary: No focal liver abnormality is seen. Status post cholecystectomy. No biliary dilatation. Pancreas: Atrophic but otherwise unremarkable. Spleen: Unremarkable Adrenals/Urinary Tract: The adrenal glands are unremarkable. The kidneys are normal in size and position. Mild bilateral hydronephrosis. There is superimposed urothelial enhancement in keeping with inflammation. No intrarenal or ureteral calculi. No perinephric fluid collections. No enhancing  intrarenal masses. The bladder is markedly thick walled, hyperemic, and demonstrates extensive perivesicular inflammatory stranding in keeping with changes a diffuse infectious or inflammatory cystitis. Foley catheter balloon is seen within the bladder lumen which is largely decompressed. No perivesicular fluid collections are identified. Stomach/Bowel: The stomach, small bowel, and large bowel are unremarkable save for mild descending colonic diverticulosis. There is severe circumferential bowel wall thickening involving the mid and distal rectum as well as extensive perirectal edema suggesting changes of a infectious or inflammatory proctitis. No evidence of obstruction. No free intraperitoneal gas or fluid. The appendix is normal. Vascular/Lymphatic: Aortic atherosclerosis. No enlarged abdominal or pelvic lymph nodes. Reproductive: Prostate is unremarkable. Other: Moderate fat containing supraumbilical ventral hernia is present. Tiny fat containing right inguinal hernia. Musculoskeletal: Probable L4 vertebral hemangioma. No suspicious lytic or blastic bone lesions are identified. No acute bone abnormality. IMPRESSION: 1. Changes in keeping with severe infectious or inflammatory cystitis. Foley catheter balloon is seen within the bladder lumen which is largely decompressed. No perivesicular fluid collections are identified. 2. Mild bilateral hydronephrosis with superimposed urothelial enhancement in keeping with inflammation, possibly reflecting an ascending urinary tract infection given the findings within the bladder. No intrarenal or ureteral calculi. 3. Severe circumferential bowel wall thickening involving the mid and distal rectum as well as extensive perirectal edema suggesting changes of a infectious or inflammatory proctitis. No obstruction or perforation. 4. Extensive multi-vessel coronary artery calcification. 5. Aortic atherosclerosis. Aortic Atherosclerosis (ICD10-I70.0). Electronically Signed   By:  Helyn Numbers M.D.   On: 10/18/2022 01:07   DG Chest 2 View  Result Date: 10/18/2022 CLINICAL DATA:  Hypoxia. EXAM: CHEST - 2 VIEW COMPARISON:  [id number]. FINDINGS: The heart size and mediastinal contours are within normal limits. A TAVR stent is noted. There is atherosclerotic calcification of the aorta. Both lungs are clear. Degenerative changes are present in the thoracic spine. No acute osseous abnormality. IMPRESSION: No active cardiopulmonary disease. Electronically Signed   By: Thornell Sartorius M.D.   On: 10/18/2022 00:01     LOS: 0 days   Jeoffrey Massed, MD  [facility]    To contact the attending provider between 7A-7P or the covering provider during after hours 7P-7A, please log into the web site [email or web address] and access using universal [place] password for that web site. If you do not have the password, please call the hospital operator.  10/18/2022, 9:38 AM

## 2022-10-18 NOTE — ED Notes (Signed)
MD messaged for diet order

## 2022-10-18 NOTE — Progress Notes (Signed)
Pharmacy Antibiotic Note  [name] is a 76 y.o. female admitted on 10/17/2022 with UTI and cystitis.  Pharmacy has been consulted for cefepime dosing.  Plan: Cefepime 2g IV Q24H.  Height: 5\' 4"  (162.6 cm) Weight: 83.9 kg (185 lb) (from June 2023 records, please update) IBW/kg (Calculated) : 54.7  Temp (24hrs), Avg:99.4 F (37.4 C), Min:98.8 F (37.1 C), Max:100.4 F (38 C)  Recent Labs  Lab 10/17/22 2300 10/18/22 0140  WBC 23.3*  --   CREATININE 1.30*  --   LATICACIDVEN 2.5* 1.6    Estimated Creatinine Clearance: 39.2 mL/min (A) (by C-G formula based on SCr of 1.3 mg/dL (H)).    Allergies  Allergen Reactions   Morphine Other (See Comments)    'took me out of this world' I had to be resuscitated   Codeine Sulfate Nausea Only and Other (See Comments)    GI upset and pain. "When to sleep and did not wake up".    Demerol [Meperidine] Itching, Nausea And Vomiting and Swelling   Propoxyphene Hcl Other (See Comments)    Darvocet caused sick headache/swelling     Thank you for allowing pharmacy to be a part of this patient's care.  13/10/23, PharmD, BCPS  10/18/2022 4:30 AM

## 2022-10-18 NOTE — H&P (Signed)
History and Physical    [name] TKZ:[id number] DOB: December 14, 1945 DOA: 10/17/2022  PCP: Sandrea Hughs, NP  Patient coming from:  Nursing home  Chief Complaint: Confusion  HPI: [name] is a 76 y.o. female with medical history significant of depression, insulin-dependent type 2 diabetes, GERD, hyperlipidemia, hypertension, aortic stenosis status post TAVR, chronic HFpEF, peripheral neuropathy, chronic lower extremity lymphedema presented to the ED via EMS from her nursing home for evaluation of confusion in the setting of recently being diagnosed with a UTI.  Oxygen saturation was 87% on room air at her facility and she was placed on supplemental oxygen.  In the ED, patient febrile with temperature 100.4 F.  Satting well on 2 L O2.  Labs showing WBC 23.3, hemoglobin 11.8 (improved), BUN 51, creatinine 1.3 (baseline 0.6-0.8), lactic acid 2.5> 1.6, blood cultures drawn.  UA with moderate amount of leukocytes and microscopy showing greater than 50 RBCs, greater than 50 WBCs, and many bacteria.  Urine culture pending.  Chest x-ray negative for acute finding.  CT abdomen pelvis with contrast showing findings consistent with severe infectious or inflammatory cystitis/ ascending UTI.  No intrarenal or ureteral calculi.  Also showing findings consistent with infectious or inflammatory proctitis.  No obstruction or perforation. Patient was given Tylenol, cefepime, metronidazole, and 1 L LR bolus.  TRH called to admit.  Patient is currently very somnolent and not able to give any history.  She is able to wake up and tell me she is at a hospital but falling asleep quickly.  Per review of records from her nursing facility at bedside, she had a Foley catheter placed yesterday and received 1 dose of IM Rocephin.  Review of Systems:  Review of Systems  Reason unable to perform ROS: AMS.    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 Cyndia Bent and Burt Knack   Venous insufficiency    Ventral hernia     Past Surgical History:  Procedure Laterality Date   ABDOMINAL HYSTERECTOMY  2001   TAH-BSO   CHOLECYSTECTOMY  2001   COLONOSCOPY  2013   diverticulosis    ESOPHAGOGASTRODUODENOSCOPY  2013   normal    FEMUR IM NAIL Left 05/19/2022   Procedure: INTRAMEDULLARY (IM) RETROGRADE FEMORAL NAILING;  Surgeon: Vanetta Mulders, MD;  Location: [facility];  Service: Orthopedics;  Laterality: Left;   INCISIONAL HERNIA REPAIR     RIGHT/LEFT HEART CATH AND CORONARY ANGIOGRAPHY N/A 05/09/2020   Procedure: RIGHT/LEFT HEART CATH AND CORONARY ANGIOGRAPHY;  Surgeon: Jettie Booze, MD;  Location: [facility];  Service: Cardiovascular;  Laterality: N/A;   TEE WITHOUT CARDIOVERSION N/A 05/17/2020   Procedure: TRANSESOPHAGEAL ECHOCARDIOGRAM (TEE);  Surgeon: Sherren Mocha, MD;  Location: [facility];  Service: Open Heart Surgery;  Laterality: N/A;   TONSILLECTOMY  TRANSCATHETER AORTIC VALVE REPLACEMENT, TRANSFEMORAL N/A 05/17/2020   Procedure: TRANSCATHETER AORTIC VALVE REPLACEMENT, TRANSFEMORAL;  Surgeon: Sherren Mocha, MD;  Location: [facility];  Service: Open Heart Surgery;  Laterality: N/A;     reports that she has never smoked. She has never used smokeless tobacco. She reports that she does not drink alcohol and does not use drugs.  Allergies  Allergen Reactions   Morphine Other (See Comments)    'took me  out of this world' I had to be resuscitated   Codeine Sulfate Nausea Only and Other (See Comments)    GI upset and pain. "When to sleep and did not wake up".    Demerol [Meperidine] Itching, Nausea And Vomiting and Swelling   Propoxyphene Hcl Other (See Comments)    Darvocet caused sick headache/swelling     Family History  Problem Relation Age of Onset   Heart failure Mother        Died in 71s   Heart failure Father        Died in 64s   CVA Father    Ovarian cancer Sister    Diabetes Sister    Other Sister        died at birth   Other Brother        drowned   Diabetes Paternal Grandmother    Breast cancer Paternal Aunt    Colon cancer Neg Hx     Prior to Admission medications   Medication Sig Start Date End Date Taking? Authorizing Provider  acetaminophen (TYLENOL) 500 MG tablet Take 1 tablet (500 mg total) by mouth every 6 (six) hours as needed. Patient taking differently: Take 500 mg by mouth every 4 (four) hours as needed for mild pain. 05/22/22  Yes Pokhrel, Laxman, MD  atorvastatin (LIPITOR) 80 MG tablet Take 1 tablet (80 mg total) by mouth daily. 03/29/22  Yes Hoyt Koch, MD  cefTRIAXone (ROCEPHIN) 500 MG injection Inject 500 mg into the muscle once. For IM use in large muscle mass   Yes [provider]  citalopram (CELEXA) 20 MG tablet Take 1 tablet (20 mg total) by mouth daily. 10/04/21  Yes Hoyt Koch, MD  Dulaglutide (TRULICITY) 1.5 TD/4.2AJ SOPN Inject 1.5 mg into the skin every 7 (seven) days. On Saturdays   Yes [provider]  esomeprazole (NEXIUM) 20 MG capsule Take 1 capsule (20 mg total) by mouth daily at 12 noon. 03/07/20  Yes Hoyt Koch, MD  ezetimibe (ZETIA) 10 MG tablet TAKE 1 TABLET BY MOUTH  DAILY Patient taking differently: Take 10 mg by mouth daily. 02/05/22  Yes Hoyt Koch, MD  gabapentin (NEURONTIN) 300 MG capsule TAKE 1 CAPSULE BY MOUTH 3  TIMES DAILY Patient taking differently: Take 300 mg by  mouth 3 (three) times daily. 04/12/22  Yes Hoyt Koch, MD  Infant Care Products [facility]) OINT Apply 1 application  topically See admin instructions. Apply to buttocks, sacrum topically every shift (three times a day) for protection   Yes [provider]  insulin glargine (LANTUS) 100 UNIT/ML Solostar Pen Inject 14 Units into the skin at bedtime.   Yes [provider]  magnesium oxide (MAG-OX) 400 (240 Mg) MG tablet Take 400 mg by mouth 2 (two) times daily.   Yes [provider]  metFORMIN (GLUCOPHAGE) 500 MG tablet Take 500 mg by mouth in the morning and at bedtime.   Yes [provider]  metoprolol tartrate (LOPRESSOR) 25 MG tablet Take 0.5 tablets (12.5  mg total) by mouth 2 (two) times daily. 10/04/21  Yes Hoyt Koch, MD  promethazine (PHENERGAN) 25 MG tablet Take 25 mg by mouth every 12 (twelve) hours as needed for nausea or vomiting.   Yes [provider]  torsemide (DEMADEX) 20 MG tablet Take 1 tablet (20 mg total) by mouth daily. 10/04/21  Yes Hoyt Koch, MD  ASPERCREME LIDOCAINE EX Apply 1 application  topically as needed (pain). Patient not taking: Reported on 10/18/2022    [provider]  aspirin 81 MG tablet Take 1 tablet (81 mg total) by mouth daily. Start on July 14,2023 after completing full dose aspirin Patient not taking: Reported on 10/18/2022 06/21/22   Pokhrel, Corrie Mckusick, MD  collagenase (SANTYL) 250 UNIT/GM ointment Apply 1 application. topically 3 (three) times daily. Apply to sacrum Patient not taking: Reported on 10/18/2022 05/02/22   [name], [name], NP  Continuous Blood Gluc Sensor (Lazy Y U) MISC Use to monitor sugars 05/02/22   [name], [name], NP  Insulin Pen Needle 31G X 8 MM MISC Use to inject insulin four times daily E11.41 05/02/22   [name], [name], NP  nystatin (MYCOSTATIN/NYSTOP) powder Apply 1 application. topically 3 (three) times daily. Patient not taking:  Reported on 10/18/2022 04/24/22   [name], [name], NP  [facility] VERIO test strip USE AS DIRECTED 03/13/22   Hoyt Koch, MD  oxyCODONE (OXY IR/ROXICODONE) 5 MG immediate release tablet Take 1 tablet (5 mg total) by mouth every 6 (six) hours as needed for moderate pain or severe pain. Patient not taking: Reported on 10/18/2022 05/22/22   Pokhrel, Corrie Mckusick, MD  Semaglutide (RYBELSUS) 14 MG TABS Take 14 mg by mouth daily. Patient not taking: Reported on 10/18/2022 10/04/21   Hoyt Koch, MD  silver sulfADIAZINE (SILVADENE) 1 % cream Apply 1 application topically daily. Patient not taking: Reported on 10/18/2022 01/01/22   Hoyt Koch, MD  sodium chloride (OCEAN) 0.65 % SOLN nasal spray Place 1 spray into both nostrils 4 (four) times daily. Patient not taking: Reported on 10/18/2022 03/07/20   Hoyt Koch, MD  TRESIBA FLEXTOUCH 200 UNIT/ML FlexTouch Pen INJECT SUBCUTANEOUSLY 44 UNITS  AT BEDTIME Patient not taking: Reported on 10/18/2022 06/28/22   [name], [name], NP    Physical Exam: Vitals:   10/17/22 2243 10/18/22 0013 10/18/22 0126  BP: 101/65  126/74  Pulse: 80  86  Resp: 12  18  Temp: 99.1 F (37.3 C) (!) 100.4 F (38 C) 98.8 F (37.1 C)  TempSrc: Oral Rectal Oral  SpO2: 95%  100%    Physical Exam Vitals reviewed.  Constitutional:      General: She is not in acute distress. HENT:     Head: Normocephalic and atraumatic.     Mouth/Throat:     Comments: Very dry mucous membranes Cardiovascular:     Rate and Rhythm: Normal rate and regular rhythm.     Pulses: Normal pulses.  Pulmonary:     Effort: Pulmonary effort is normal. No respiratory distress.     Breath sounds: Normal breath sounds. No wheezing or rales.  Abdominal:     General: Bowel sounds are normal. There is no distension.     Palpations: Abdomen is soft.     Tenderness: There is abdominal tenderness. There is no guarding or rebound.     Comments: Suprapubic tenderness   Musculoskeletal:        General: No swelling or tenderness.     Cervical back:  Normal range of motion.  Skin:    General: Skin is warm and dry.  Neurological:     Mental Status: She is alert.     Comments: Very somnolent Oriented to person and place only Moving bilateral upper extremities spontaneously     Labs on Admission: I have personally reviewed following labs and imaging studies  CBC: Recent Labs  Lab 10/17/22 2300  WBC 23.3*  NEUTROABS 18.1*  HGB 11.8*  HCT 35.5*  MCV 85.3  PLT 578   Basic Metabolic Panel: Recent Labs  Lab 10/17/22 2300  NA 134*  K 4.0  CL 94*  CO2 30  GLUCOSE 186*  BUN 51*  CREATININE 1.30*  CALCIUM 8.9   GFR: CrCl cannot be calculated (Unknown ideal weight.). Liver Function Tests: No results for input(s): "AST", "ALT", "ALKPHOS", "BILITOT", "PROT", "ALBUMIN" in the last 168 hours. No results for input(s): "LIPASE", "AMYLASE" in the last 168 hours. No results for input(s): "AMMONIA" in the last 168 hours. Coagulation Profile: No results for input(s): "INR", "PROTIME" in the last 168 hours. Cardiac Enzymes: No results for input(s): "CKTOTAL", "CKMB", "CKMBINDEX", "TROPONINI" in the last 168 hours. BNP (last 3 results) No results for input(s): "PROBNP" in the last 8760 hours. HbA1C: No results for input(s): "HGBA1C" in the last 72 hours. CBG: Recent Labs  Lab 10/18/22 0023  GLUCAP 174*   Lipid Profile: No results for input(s): "CHOL", "HDL", "LDLCALC", "TRIG", "CHOLHDL", "LDLDIRECT" in the last 72 hours. Thyroid Function Tests: No results for input(s): "TSH", "T4TOTAL", "FREET4", "T3FREE", "THYROIDAB" in the last 72 hours. Anemia Panel: No results for input(s): "VITAMINB12", "FOLATE", "FERRITIN", "TIBC", "IRON", "RETICCTPCT" in the last 72 hours. Urine analysis:    Component Value Date/Time   COLORURINE AMBER (A) 10/18/2022 0004   APPEARANCEUR TURBID (A) 10/18/2022 0004   LABSPEC 1.017 10/18/2022 0004   PHURINE 5.0  10/18/2022 0004   GLUCOSEU NEGATIVE 10/18/2022 0004   HGBUR LARGE (A) 10/18/2022 0004   BILIRUBINUR NEGATIVE 10/18/2022 0004   BILIRUBINUR Neg 05/10/2019 1029   KETONESUR NEGATIVE 10/18/2022 0004   PROTEINUR >=300 (A) 10/18/2022 0004   UROBILINOGEN 0.2 05/10/2019 1029   UROBILINOGEN 0.2 12/19/2014 1425   NITRITE NEGATIVE 10/18/2022 0004   LEUKOCYTESUR MODERATE (A) 10/18/2022 0004    Radiological Exams on Admission: CT ABDOMEN PELVIS W CONTRAST  Result Date: 10/18/2022 CLINICAL DATA:  Abdominal pain, acute, nonlocalized. EXAM: CT ABDOMEN AND PELVIS WITH CONTRAST TECHNIQUE: Multidetector CT imaging of the abdomen and pelvis was performed using the standard protocol following bolus administration of intravenous contrast. RADIATION DOSE REDUCTION: This exam was performed according to the departmental dose-optimization program which includes automated exposure control, adjustment of the mA and/or kV according to patient size and/or use of iterative reconstruction technique. CONTRAST:  42m OMNIPAQUE IOHEXOL 350 MG/ML SOLN COMPARISON:  None Available. FINDINGS: Lower chest: Mild bibasilar atelectasis. 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 Extensive multi-vessel coronary artery calcification. Global cardiac size within normal limits. No pericardial effusion. Hepatobiliary: No focal liver abnormality is seen. Status post cholecystectomy. No biliary dilatation. Pancreas: Atrophic but otherwise unremarkable. Spleen: Unremarkable Adrenals/Urinary Tract: The adrenal glands are unremarkable. The kidneys are normal in size and position. Mild bilateral hydronephrosis. There is superimposed urothelial enhancement in keeping with inflammation. No intrarenal or ureteral calculi. No perinephric fluid collections. No enhancing intrarenal masses. The bladder is markedly thick walled, hyperemic, and demonstrates extensive perivesicular inflammatory stranding in keeping with changes a diffuse  infectious or inflammatory cystitis. Foley catheter balloon is seen within the bladder  lumen which is largely decompressed. No perivesicular fluid collections are identified. Stomach/Bowel: The stomach, small bowel, and large bowel are unremarkable save for mild descending colonic diverticulosis. There is severe circumferential bowel wall thickening involving the mid and distal rectum as well as extensive perirectal edema suggesting changes of a infectious or inflammatory proctitis. No evidence of obstruction. No free intraperitoneal gas or fluid. The appendix is normal. Vascular/Lymphatic: Aortic atherosclerosis. No enlarged abdominal or pelvic lymph nodes. Reproductive: Prostate is unremarkable. Other: Moderate fat containing supraumbilical ventral hernia is present. Tiny fat containing right inguinal hernia. Musculoskeletal: Probable L4 vertebral hemangioma. No suspicious lytic or blastic bone lesions are identified. No acute bone abnormality. IMPRESSION: 1. Changes in keeping with severe infectious or inflammatory cystitis. Foley catheter balloon is seen within the bladder lumen which is largely decompressed. No perivesicular fluid collections are identified. 2. Mild bilateral hydronephrosis with superimposed urothelial enhancement in keeping with inflammation, possibly reflecting an ascending urinary tract infection given the findings within the bladder. No intrarenal or ureteral calculi. 3. Severe circumferential bowel wall thickening involving the mid and distal rectum as well as extensive perirectal edema suggesting changes of a infectious or inflammatory proctitis. No obstruction or perforation. 4. Extensive multi-vessel coronary artery calcification. 5. Aortic atherosclerosis. Aortic Atherosclerosis (ICD10-I70.0). Electronically Signed   By: Fidela Salisbury M.D.   On: 10/18/2022 01:07   DG Chest 2 View  Result Date: 10/18/2022 CLINICAL DATA:  Hypoxia. EXAM: CHEST - 2 VIEW COMPARISON:  [id number]. FINDINGS:  The heart size and mediastinal contours are within normal limits. A TAVR stent is noted. There is atherosclerotic calcification of the aorta. Both lungs are clear. Degenerative changes are present in the thoracic spine. No acute osseous abnormality. IMPRESSION: No active cardiopulmonary disease. Electronically Signed   By: Brett Fairy M.D.   On: 10/18/2022 00:01    EKG: Independently reviewed.  Sinus tachycardia.  No significant change since prior tracing.  Assessment and Plan  Severe cystitis/ ascending UTI UA with moderate amount of leukocytes and microscopy showing greater than 50 RBCs, greater than 50 WBCs, and many bacteria.  CT showing findings consistent with severe infectious or inflammatory cystitis/ ascending UTI.  No intrarenal or ureteral calculi.  She had a Foley catheter placed at her facility yesterday and blood-tinged urine noted in Foley bag. -Continue cefepime -Urine culture pending  Acute proctitis CT showing findings consistent with infectious or inflammatory proctitis.  No obstruction or perforation. -Continue cefepime and metronidazole  Severe sepsis Due to conditions listed above. Met criteria for severe sepsis at the time of presentation with fever, leukocytosis, and lactic acidosis.   -Continue antibiotics -Tylenol as needed for fevers -Lactic acidosis resolved after 1 L fluid bolus.  Blood pressure soft, continue IV fluid hydration. -Urine and blood cultures pending -Monitor WBC count  Acute metabolic encephalopathy Likely secondary to UTI.  Patient is currently very somnolent. -Stat CT head ordered  Acute hypoxemic respiratory failure Reportedly oxygen saturation 87% on room air at her facility.  Currently satting 100% on 2 L O2.  Lungs clear on exam.  Chest x-ray negative for acute finding.  PE less likely given no tachycardia.  No obvious signs of lower extremity DVT on exam. -Check D-dimer -Continue supplemental oxygen, wean as tolerated  AKI Likely  prerenal in etiology.  She has very dry mucous membranes.  No evidence of obstructive uropathy on CT.  BUN 51, creatinine 1.3 (baseline 0.6-0.8). -Continue IV fluid hydration -Monitor renal function -Avoid nephrotoxic agents/hold home torsemide  Insulin-dependent type 2  diabetes Very poorly controlled -A1c >14 in May 2023. -Repeat A1c -Continue home basal insulin -Sensitive sliding scale insulin  Chronic HFpEF Echo done in June 2022 showing EF 60 to 65% and grade 2 diastolic dysfunction.  Does not appear volume overloaded on exam. -Hold torsemide given AKI -Check BNP  Hypertension -Hold antihypertensives at this time given severe sepsis/soft blood pressure.  GERD Hyperlipidemia Depression -Continue home meds when more awake and able to tolerate p.o. intake.  DVT prophylaxis: No anticoagulation until CT head is done.  No SCDs until D-dimer is checked. Code Status: DNR  Family Communication: No family available at this time. Level of care: Telemetry bed Admission status: It is my clinical opinion that admission to INPATIENT is reasonable and necessary because of the expectation that this patient will require hospital care that crosses at least 2 midnights to treat this condition based on the medical complexity of the problems presented.  Given the aforementioned information, the predictability of an adverse outcome is felt to be significant.   Shela Leff MD [facility]  If 7PM-7AM, please contact night-coverage [email or web address]  10/18/2022, 2:37 AM

## 2022-10-19 DIAGNOSIS — I5032 Chronic diastolic (congestive) heart failure: Secondary | ICD-10-CM | POA: Diagnosis not present

## 2022-10-19 DIAGNOSIS — A419 Sepsis, unspecified organism: Secondary | ICD-10-CM | POA: Diagnosis not present

## 2022-10-19 DIAGNOSIS — N390 Urinary tract infection, site not specified: Secondary | ICD-10-CM | POA: Diagnosis not present

## 2022-10-19 DIAGNOSIS — I10 Essential (primary) hypertension: Secondary | ICD-10-CM | POA: Diagnosis not present

## 2022-10-19 LAB — COMPREHENSIVE METABOLIC PANEL
ALT: 16 U/L (ref 0–44)
AST: 19 U/L (ref 15–41)
Albumin: 1.8 g/dL — ABNORMAL LOW (ref 3.5–5.0)
Alkaline Phosphatase: 152 U/L — ABNORMAL HIGH (ref 38–126)
Anion gap: 10 (ref 5–15)
BUN: 27 mg/dL — ABNORMAL HIGH (ref 8–23)
CO2: 23 mmol/L (ref 22–32)
Calcium: 8 mg/dL — ABNORMAL LOW (ref 8.9–10.3)
Chloride: 100 mmol/L (ref 98–111)
Creatinine, Ser: 0.53 mg/dL (ref 0.44–1.00)
GFR, Estimated: >60 mL/min (ref >60)
Glucose, Bld: 193 mg/dL — ABNORMAL HIGH (ref 70–99)
Potassium: 3.4 mmol/L — ABNORMAL LOW (ref 3.5–5.1)
Sodium: 133 mmol/L — ABNORMAL LOW (ref 135–145)
Total Bilirubin: 0.4 mg/dL (ref 0.3–1.2)
Total Protein: 5.2 g/dL — ABNORMAL LOW (ref 6.5–8.1)

## 2022-10-19 LAB — GLUCOSE, CAPILLARY
Glucose-Capillary: 117 mg/dL — ABNORMAL HIGH (ref 70–99)
Glucose-Capillary: 120 mg/dL — ABNORMAL HIGH (ref 70–99)
Glucose-Capillary: 130 mg/dL — ABNORMAL HIGH (ref 70–99)
Glucose-Capillary: 120 mg/dL — ABNORMAL HIGH (ref 70–99)
Glucose-Capillary: 127 mg/dL — ABNORMAL HIGH (ref 70–99)
Glucose-Capillary: 145 mg/dL — ABNORMAL HIGH (ref 70–99)

## 2022-10-19 LAB — CBC
HCT: 29.3 % — ABNORMAL LOW (ref 36.0–46.0)
Hemoglobin: 9.2 g/dL — ABNORMAL LOW (ref 12.0–15.0)
MCH: 27.8 pg (ref 26.0–34.0)
MCHC: 31.4 g/dL (ref 30.0–36.0)
MCV: 88.5 fL (ref 80.0–100.0)
Platelets: 165 10*3/uL (ref 150–400)
RBC: 3.31 MIL/uL — ABNORMAL LOW (ref 3.87–5.11)
RDW: 14.9 % (ref 11.5–15.5)
WBC: 11.9 10*3/uL — ABNORMAL HIGH (ref 4.0–10.5)
nRBC: 0 % (ref 0.0–0.2)

## 2022-10-19 MED ORDER — METRONIDAZOLE 500 MG PO TABS
500.0000 mg | ORAL_TABLET | Freq: Two times a day (BID) | ORAL | Status: DC
  Administered 2022-10-19 – 2022-10-24 (×10): 500 mg via ORAL
  Filled 2022-10-19 (×10): qty 1

## 2022-10-19 MED ORDER — TORSEMIDE 20 MG PO TABS
20.0000 mg | ORAL_TABLET | Freq: Every day | ORAL | Status: DC
  Administered 2022-10-19 – 2022-10-24 (×6): 20 mg via ORAL
  Filled 2022-10-19 (×6): qty 1

## 2022-10-19 MED ORDER — POTASSIUM CHLORIDE CRYS ER 20 MEQ PO TBCR
40.0000 meq | EXTENDED_RELEASE_TABLET | Freq: Once | ORAL | Status: AC
  Administered 2022-10-19: 40 meq via ORAL
  Filled 2022-10-19: qty 2

## 2022-10-19 MED ORDER — METOPROLOL TARTRATE 12.5 MG HALF TABLET
12.5000 mg | ORAL_TABLET | Freq: Two times a day (BID) | ORAL | Status: DC
  Administered 2022-10-19 – 2022-10-22 (×7): 12.5 mg via ORAL
  Filled 2022-10-19 (×7): qty 1

## 2022-10-19 NOTE — Evaluation (Addendum)
Occupational Therapy Evaluation Patient Details Name: [name] MRN: [id number] DOB: 02-Jan-1946 Today's Date: 10/19/2022   History of Present Illness 76 y/o female presented to ED on 10/17/22 for confusion and discolored urine from [facility]. Recent dx of UTI and receiving antibiotics. Admitted for severe cystitis/ascending UTI and acute encephalopathy. PMH: T2DM, HTN, lymphedema, aortic stenosis s/p TAVR, chronic HFpEF, peripheral neuropathy   Clinical Impression   [name] was evaluated s/p the above admission list, she is from SNF. Pt presents with impaired cognition and is only oriented to self and time and demonstrated progressive agitation throughout the session. Overall she was limited by agitation, weakness, poor sitting balance, and decreased activity tolerance. Overall she required max A +2 to progress to EOB with very poor sitting balance initially. Eventually, pt able to sit with min G, but did have a posterior LOB. Recommend d/c back to [facility], pt is at her functional baseline and does not required acute OT intervention.      Recommendations for follow up therapy are one component of a multi-disciplinary discharge planning process, led by the attending physician.  Recommendations may be updated based on patient status, additional functional criteria and insurance authorization.   Follow Up Recommendations  Skilled nursing-short term rehab (<3 hours/day) (back to [facility])    Assistance Recommended at Discharge Frequent or constant Supervision/Assistance  Patient can return home with the following A lot of help with walking and/or transfers;A lot of help with bathing/dressing/bathroom;Direct supervision/assist for medications management;Direct supervision/assist for financial management;Assist for transportation;Help with stairs or ramp for entrance    Functional Status Assessment  Patient has had a recent decline in their functional status and demonstrates the ability to  make significant improvements in function in a reasonable and predictable amount of time.  Equipment Recommendations  None recommended by OT       Precautions / Restrictions Precautions Precautions: Fall Precaution Comments: 2L at baseline Restrictions Weight Bearing Restrictions: No      Mobility Bed Mobility Overal bed mobility: Needs Assistance Bed Mobility: Supine to Sit, Sit to Supine     Supine to sit: Max assist, +2 for physical assistance, +2 for safety/equipment Sit to supine: Max assist, +2 for physical assistance, +2 for safety/equipment        Transfers Overall transfer level: Needs assistance                 General transfer comment: deferred due to agitation      Balance Overall balance assessment: Needs assistance Sitting-balance support: Feet supported, Bilateral upper extremity supported Sitting balance-Leahy Scale: Poor Sitting balance - Comments: intially max A for sitting balance with severe L lateral and posterior lean. with cues pt able to sit unsupported for 30 seconds at min G                                   ADL either performed or assessed with clinical judgement   ADL Overall ADL's : Needs assistance/impaired Eating/Feeding: Maximal assistance;Sitting   Grooming: Maximal assistance;Sitting   Upper Body Bathing: Maximal assistance;Bed level   Lower Body Bathing: Total assistance;Bed level;+2 for physical assistance;+2 for safety/equipment   Upper Body Dressing : Maximal assistance;Sitting   Lower Body Dressing: Bed level;Total assistance   Toilet Transfer: Total assistance;+2 for safety/equipment;+2 for physical assistance   Toileting- Clothing Manipulation and Hygiene: Total assistance;+2 for physical assistance;+2 for safety/equipment       Functional mobility during  ADLs: Maximal assistance;+2 for physical assistance;+2 for safety/equipment (bed level) General ADL Comments: EOB only due to agitation      Vision Baseline Vision/History: 0 No visual deficits Vision Assessment?: No apparent visual deficits         Hand Dominance Right   Extremity/Trunk Assessment Upper Extremity Assessment Upper Extremity Assessment: Generalized weakness   Lower Extremity Assessment Lower Extremity Assessment: Generalized weakness   Cervical / Trunk Assessment Cervical / Trunk Assessment: Kyphotic   Communication Communication Communication: No difficulties   Cognition Arousal/Alertness: Awake/alert Behavior During Therapy: Agitated Overall Cognitive Status: No family/caregiver present to determine baseline cognitive functioning                                 General Comments: oriented to self and year only. increasingly agitated throughout session. unsure of cognitive baseline     General Comments  VSS on 2L     Home Living Family/patient expects to be discharged to:: Skilled nursing facility                                 Additional Comments: [facility] since June, resident.      Prior Functioning/Environment Prior Level of Function : Needs assist;Patient poor historian/Family not available             Mobility Comments: Pt reporting she walks but staff does not let her out of the bed (pt no a reliable historian). ADLs Comments: Assuming staff assists with all -pt reporting indep. Admits to a few falls at SNF        OT Problem List: Decreased strength;Decreased range of motion;Decreased activity tolerance;Decreased cognition;Decreased safety awareness;Decreased knowledge of use of DME or AE;Decreased knowledge of precautions;Pain      OT Treatment/Interventions:      OT Goals(Current goals can be found in the care plan section) Acute Rehab OT Goals Patient Stated Goal: to be left alone OT Goal Formulation: Patient unable to participate in goal setting Time For Goal Achievement: 10/19/22   AM-PAC OT "6 Clicks" Daily Activity     Outcome  Measure Help from another person eating meals?: A Lot Help from another person taking care of personal grooming?: A Lot Help from another person toileting, which includes using toliet, bedpan, or urinal?: Total Help from another person bathing (including washing, rinsing, drying)?: A Lot Help from another person to put on and taking off regular upper body clothing?: A Lot Help from another person to put on and taking off regular lower body clothing?: Total 6 Click Score: 10   End of Session Equipment Utilized During Treatment: Oxygen Nurse Communication: Mobility status (pt agitated and threatening to hit staff)  Activity Tolerance: Treatment limited secondary to agitation Patient left: in bed;with call bell/phone within reach;with bed alarm set  OT Visit Diagnosis: Unsteadiness on feet (R26.81);Other abnormalities of gait and mobility (R26.89);Muscle weakness (generalized) (M62.81);Pain                Time: 3428-7681 OT Time Calculation (min): 20 min Charges:  OT General Charges $OT Visit: 1 Visit OT Evaluation $OT Eval Moderate Complexity: 1 Mod   Quintina Hakeem D Causey 10/19/2022, 2:44 PM

## 2022-10-19 NOTE — Evaluation (Signed)
Physical Therapy Evaluation Patient Details Name: [name] MRN: [id number] DOB: 1946-08-05 Today's Date: 10/19/2022  History of Present Illness  76 y/o female presented to ED on 10/17/22 for confusion and discolored urine from [facility]. Recent dx of UTI and receiving antibiotics. Admitted for severe cystitis/ascending UTI and acute encephalopathy. PMH: T2DM, HTN, lymphedema, aortic stenosis s/p TAVR, chronic HFpEF, peripheral neuropathy  Clinical Impression  Patient admitted with the above. Patient from [facility] where she resides and reports numerous falls and states staff does not let her OOB. Patient presents with weakness, impaired cognition, and impaired balance. Patient limited by agitation with attempts at mobility. Required maxA+2 for bed mobility and maxA to maintain sitting balance at EOB initially progressing to min guard for ~30 seconds. Patient seems to be at functional baseline. Recommend return to SNF at discharge. No further skilled PT needs identified acutely.        Recommendations for follow up therapy are one component of a multi-disciplinary discharge planning process, led by the attending physician.  Recommendations may be updated based on patient status, additional functional criteria and insurance authorization.  Follow Up Recommendations Skilled nursing-short term rehab (<3 hours/day) (return to [facility])  Can patient physically be transported by private vehicle: No    Assistance Recommended at Discharge Frequent or constant Supervision/Assistance  Patient can return home with the following       Equipment Recommendations None recommended by PT  Recommendations for Other Services       Functional Status Assessment Patient has not had a recent decline in their functional status     Precautions / Restrictions Precautions Precautions: Fall Precaution Comments: 2L at baseline Restrictions Weight Bearing Restrictions: No      Mobility  Bed  Mobility Overal bed mobility: Needs Assistance Bed Mobility: Supine to Sit, Sit to Supine     Supine to sit: Max assist, +2 for physical assistance, +2 for safety/equipment Sit to supine: Max assist, +2 for physical assistance, +2 for safety/equipment        Transfers Overall transfer level: Needs assistance                 General transfer comment: deferred due to agitation    Ambulation/Gait                  Stairs            Wheelchair Mobility    Modified Rankin (Stroke Patients Only)       Balance Overall balance assessment: Needs assistance Sitting-balance support: Feet supported, Bilateral upper extremity supported Sitting balance-Leahy Scale: Poor Sitting balance - Comments: intially max A for sitting balance with severe L lateral and posterior lean. with cues pt able to sit unsupported for 30 seconds at min G                                     Pertinent Vitals/Pain      Home Living Family/patient expects to be discharged to:: Skilled nursing facility                   Additional Comments: [facility] since June, resident.    Prior Function Prior Level of Function : Needs assist;Patient poor historian/Family not available             Mobility Comments: Pt reporting she walks but staff does not let her out of the bed (pt no a  reliable historian). ADLs Comments: Assuming staff assists with all -pt reporting indep. Admits to a few falls at SNF     Hand Dominance   Dominant Hand: Right    Extremity/Trunk Assessment   Upper Extremity Assessment Upper Extremity Assessment: Generalized weakness    Lower Extremity Assessment Lower Extremity Assessment: Generalized weakness    Cervical / Trunk Assessment Cervical / Trunk Assessment: Kyphotic  Communication   Communication: No difficulties  Cognition Arousal/Alertness: Awake/alert Behavior During Therapy: Agitated Overall Cognitive Status: No  family/caregiver present to determine baseline cognitive functioning                                 General Comments: oriented to self and year only. increasingly agitated throughout session. unsure of cognitive baseline        General Comments General comments (skin integrity, edema, etc.): VSS on 2L    Exercises     Assessment/Plan    PT Assessment All further PT needs can be met in the next venue of care  PT Problem List         PT Treatment Interventions      PT Goals (Current goals can be found in the Care Plan section)  Acute Rehab PT Goals Patient Stated Goal: did not state PT Goal Formulation: All assessment and education complete, DC therapy    Frequency       Co-evaluation PT/OT/SLP Co-Evaluation/Treatment: Yes Reason for Co-Treatment: Complexity of the patient's impairments (multi-system involvement);For patient/therapist safety;To address functional/ADL transfers PT goals addressed during session: Mobility/safety with mobility;Balance OT goals addressed during session: ADL's and self-care       AM-PAC PT "6 Clicks" Mobility  Outcome Measure Help needed turning from your back to your side while in a flat bed without using bedrails?: Total Help needed moving from lying on your back to sitting on the side of a flat bed without using bedrails?: Total Help needed moving to and from a bed to a chair (including a wheelchair)?: Total Help needed standing up from a chair using your arms (e.g., wheelchair or bedside chair)?: Total Help needed to walk in hospital room?: Total Help needed climbing 3-5 steps with a railing? : Total 6 Click Score: 6    End of Session Equipment Utilized During Treatment: Oxygen Activity Tolerance: Treatment limited secondary to agitation Patient left: in bed;with call bell/phone within reach;with bed alarm set Nurse Communication: Mobility status PT Visit Diagnosis: Muscle weakness (generalized) (M62.81);Unsteadiness  on feet (R26.81);History of falling (Z91.81)    Time: 8638-1771 PT Time Calculation (min) (ACUTE ONLY): 17 min   Charges:   PT Evaluation $PT Eval Moderate Complexity: 1 Mod          Dre Gamino A. Gilford Rile PT, DPT Acute Rehabilitation Services Office [phone]   Linna Hoff 10/19/2022, 2:47 PM

## 2022-10-19 NOTE — Progress Notes (Signed)
PROGRESS NOTE        PATIENT DETAILS Name: [name] Age: 76 y.o. Sex: female Date of Birth: January 08, 1946 Admit Date: 10/17/2022 Admitting Physician Shela Leff, MD SX:[id number], [name], NP  Brief Summary: Patient is a 76 y.o.  female with history of DM-2, HTN, HLD, aortic stenosis s/p TAVR, HFpEF, chronic lower extremity lymphedema-who was recently diagnosed with UTI at [facility] on IM Rocephin-sent from SNF for worsening confusion-she was thought to have sepsis in the setting of complicated UTI and subsequently admitted to hospitalist service.  Significant events: 11/9>> admit to [facility]-severe sepsis-complicated UTI/proctitis.  Significant studies: 11/09>> CXR: No PNA 11/10>> CT abdomen/pelvis: Cystitis/bilateral hydronephrosis/proctitis without obstruction or perforation. 11/10>> CT head: No acute intracranial abnormality.  Significant microbiology data: 11/10>> blood culture: No growth 11/10>> urine culture: Pending  Procedures: None  Consults: None  Subjective: Much more awake and alert compared to yesterday.  Wanting to go back to SNF.  Claims to have had 3 loose stools yesterday-none today.  Objective: Vitals: Blood pressure (!) 150/64, pulse 92, temperature 98.3 F (36.8 C), temperature source Oral, resp. rate 19, height 5\' 4"  (1.626 m), weight 74.3 kg, SpO2 94 %.   Exam: Gen Exam:Alert awake-not in any distress HEENT:atraumatic, normocephalic Chest: B/L clear to auscultation anteriorly CVS:S1S2 regular Abdomen:soft non tender, non distended Extremities:no edema Neurology: Non focal Skin: no rash  Pertinent Labs/Radiology:    Latest Ref Rng & Units 10/19/2022    1:18 AM 10/18/2022    4:13 AM 10/17/2022   11:00 PM  CBC  WBC 4.0 - 10.5 K/uL 11.9  17.3  23.3   Hemoglobin 12.0 - 15.0 g/dL 9.2  8.8  11.8   Hematocrit 36.0 - 46.0 % 29.3  26.6  35.5   Platelets 150 - 400 K/uL 165  169  244     Lab Results   Component Value Date   NA 133 (L) 10/19/2022   K 3.4 (L) 10/19/2022   CL 100 10/19/2022   CO2 23 10/19/2022     Assessment/Plan: Severe sepsis due to complicated UTI (cystitis with pyelonephritis) Acute proctitis Sepsis physiology resolved Continue empiric antibiotics-await further culture data-but patient was already on IV Rocephin at SNF and cultures could be potentially sterile. Apart from mild  Acute metabolic encephalopathy Due to severe sepsis Significantly better-encephalopathy resolved. Maintain delirium precautions.  ?Acute hypoxic respiratory failure Likely error with pulse ox-hypoxia ruled out She has no respiratory symptoms On room air.  AKI Hemodynamically mediated AKI has resolved-supportive care for now.  Hypokalemia Replete/recheck.  Hyponatremia Mild-clinically insignificant Repeat electrolytes periodically.  Normocytic anemia Chronic issue since June of this year and will likely worsen due to acute illness. No evidence of blood loss Follow CBC.  Chronic HFpEF Stable/euvolemic-chronic lymphedema at baseline. Restart Demadex  Chronic bilateral lymphedema Appears to be at baseline Restart Demadex  History of aortic stenosis-s/p TAVR 2021  HTN BP creeping up Restart metoprolol.  DM-2 (A1c> 14.0 on 5/17) CBGs stable Continue Semglee 14 units daily/SSI Follow/optimize.  Recent Labs    10/19/22 0611 10/19/22 0731 10/19/22 1120  GLUCAP 120* 130* 120*     HLD Resume statin/Zetia  GERD PPI  Peripheral neuropathy Continue Neurontin  Depression Continue Celexa  Frailty/debility/deconditioning PT/OT eval Currently at SNF  Obesity: Estimated body mass index is 28.12 kg/m as calculated from the following:   Height as of this encounter:  5\' 4"  (1.626 m).   Weight as of this encounter: 74.3 kg.    Code status:   Code Status: DNR   DVT Prophylaxis: enoxaparin (LOVENOX) injection 40 mg Start: 10/18/22 1000   Family  Communication:  Son-Harold Ilagan-([phone] over the phone 11/11   Disposition Plan: Status is: Inpatient Remains inpatient appropriate because: Resolving sepsis physiology-not yet stable for discharge.   Planned Discharge Destination:Skilled nursing facility likely on Monday.   Diet: Diet Order             Diet heart healthy/carb modified Room service appropriate? Yes; Fluid consistency: Thin  Diet effective now                     Antimicrobial agents: Anti-infectives (From admission, onward)    Start     Dose/Rate Route Frequency Ordered Stop   10/19/22 0000  ceFEPIme (MAXIPIME) 2 g in sodium chloride 0.9 % 100 mL IVPB        2 g 200 mL/hr over 30 Minutes Intravenous Every 24 hours 10/18/22 0431     10/18/22 0145  metroNIDAZOLE (FLAGYL) IVPB 500 mg        500 mg 100 mL/hr over 60 Minutes Intravenous Every 12 hours 10/18/22 0140     10/18/22 0130  ceFEPIme (MAXIPIME) 2 g in sodium chloride 0.9 % 100 mL IVPB        2 g 200 mL/hr over 30 Minutes Intravenous  Once 10/18/22 0115 10/18/22 0130        MEDICATIONS: Scheduled Meds:  atorvastatin  80 mg Oral Daily   Chlorhexidine Gluconate Cloth  6 each Topical Daily   citalopram  20 mg Oral Daily   enoxaparin (LOVENOX) injection  40 mg Subcutaneous Daily   ezetimibe  10 mg Oral Daily   gabapentin  300 mg Oral TID   insulin aspart  0-9 Units Subcutaneous Q4H   insulin glargine-yfgn  14 Units Subcutaneous QHS   pantoprazole  40 mg Oral Daily   Continuous Infusions:  ceFEPime (MAXIPIME) IV 2 g (10/19/22 0029)   metronidazole 500 mg (10/19/22 0056)   PRN Meds:.acetaminophen **OR** acetaminophen, mouth rinse   I have personally reviewed following labs and imaging studies  LABORATORY DATA: CBC: Recent Labs  Lab 10/17/22 2300 10/18/22 0413 10/19/22 0118  WBC 23.3* 17.3* 11.9*  NEUTROABS 18.1*  --   --   HGB 11.8* 8.8* 9.2*  HCT 35.5* 26.6* 29.3*  MCV 85.3 85.8 88.5  PLT 244 169 165      Basic Metabolic Panel: Recent Labs  Lab 10/17/22 2300 10/18/22 0413 10/19/22 0118  NA 134* 131* 133*  K 4.0 3.2* 3.4*  CL 94* 98 100  CO2 30 26 23   GLUCOSE 186* 134* 193*  BUN 51* 45* 27*  CREATININE 1.30* 0.89 0.53  CALCIUM 8.9 7.8* 8.0*     GFR: Estimated Creatinine Clearance: 60 mL/min (by C-G formula based on SCr of 0.53 mg/dL).  Liver Function Tests: Recent Labs  Lab 10/19/22 0118  AST 19  ALT 16  ALKPHOS 152*  BILITOT 0.4  PROT 5.2*  ALBUMIN 1.8*   No results for input(s): "LIPASE", "AMYLASE" in the last 168 hours. No results for input(s): "AMMONIA" in the last 168 hours.  Coagulation Profile: No results for input(s): "INR", "PROTIME" in the last 168 hours.  Cardiac Enzymes: No results for input(s): "CKTOTAL", "CKMB", "CKMBINDEX", "TROPONINI" in the last 168 hours.  BNP (last 3 results) No results for input(s): "PROBNP" in the last  8760 hours.  Lipid Profile: No results for input(s): "CHOL", "HDL", "LDLCALC", "TRIG", "CHOLHDL", "LDLDIRECT" in the last 72 hours.  Thyroid Function Tests: No results for input(s): "TSH", "T4TOTAL", "FREET4", "T3FREE", "THYROIDAB" in the last 72 hours.  Anemia Panel: No results for input(s): "VITAMINB12", "FOLATE", "FERRITIN", "TIBC", "IRON", "RETICCTPCT" in the last 72 hours.  Urine analysis:    Component Value Date/Time   COLORURINE AMBER (A) 10/18/2022 0004   APPEARANCEUR TURBID (A) 10/18/2022 0004   LABSPEC 1.017 10/18/2022 0004   PHURINE 5.0 10/18/2022 0004   GLUCOSEU NEGATIVE 10/18/2022 0004   HGBUR LARGE (A) 10/18/2022 0004   BILIRUBINUR NEGATIVE 10/18/2022 0004   BILIRUBINUR Neg 05/10/2019 1029   KETONESUR NEGATIVE 10/18/2022 0004   PROTEINUR >=300 (A) 10/18/2022 0004   UROBILINOGEN 0.2 05/10/2019 1029   UROBILINOGEN 0.2 12/19/2014 1425   NITRITE NEGATIVE 10/18/2022 0004   LEUKOCYTESUR MODERATE (A) 10/18/2022 0004    Sepsis Labs: Lactic Acid, Venous    Component Value Date/Time   LATICACIDVEN  1.6 10/18/2022 0140    MICROBIOLOGY: Recent Results (from the past 240 hour(s))  Blood culture (routine x 2)     Status: None (Preliminary result)   Collection Time: 10/17/22 11:00 PM   Specimen: BLOOD LEFT ARM  Result Value Ref Range Status   Specimen Description BLOOD LEFT ARM  Final   Special Requests   Final    BOTTLES DRAWN AEROBIC AND ANAEROBIC Blood Culture adequate volume   Culture   Final    NO GROWTH 1 DAY Performed at [facility], [place] [address] [place], [place] [address]    Report Status PENDING  Incomplete  Blood culture (routine x 2)     Status: None (Preliminary result)   Collection Time: 10/18/22  1:45 AM   Specimen: BLOOD RIGHT ARM  Result Value Ref Range Status   Specimen Description BLOOD RIGHT ARM  Final   Special Requests   Final    BOTTLES DRAWN AEROBIC AND ANAEROBIC Blood Culture adequate volume   Culture   Final    NO GROWTH 1 DAY Performed at [facility], [place] [address], [place], [place] [address]    Report Status PENDING  Incomplete    RADIOLOGY STUDIES/RESULTS: CT HEAD WO CONTRAST (5MM)  Result Date: 10/18/2022 CLINICAL DATA:  76 year old female with delirium. EXAM: CT HEAD WITHOUT CONTRAST TECHNIQUE: Contiguous axial images were obtained from the base of the skull through the vertex without intravenous contrast. RADIATION DOSE REDUCTION: This exam was performed according to the departmental dose-optimization program which includes automated exposure control, adjustment of the mA and/or kV according to patient size and/or use of iterative reconstruction technique. COMPARISON:  Brain MRI 12/20/2014.  Head CT 05/19/2022. FINDINGS: Brain: Stable cerebral volume. No midline shift, ventriculomegaly, mass effect, evidence of mass lesion, intracranial hemorrhage or evidence of cortically based acute infarction. Gray-white differentiation throughout the brain appears stable and largely normal for age. No cortical encephalomalacia identified.  Vascular: Calcified atherosclerosis at the skull base. No suspicious intracranial vascular hyperdensity. Skull: Stable and negative. Sinuses/Orbits: Trace paranasal sinus fluid levels and mucosal thickening. Tympanic cavities and mastoids remain clear. Small volume of retained secretions in the visible nasopharynx. Other: No acute orbit or scalp soft tissue finding. IMPRESSION: 1. No acute intracranial abnormality. Stable and negative for age noncontrast CT appearance of the brain. 2. Trace paranasal sinus inflammation, significance doubtful. Electronically Signed   By: Genevie Ann M.D.   On: 10/18/2022 05:34   CT ABDOMEN PELVIS W CONTRAST  Result Date: 10/18/2022 CLINICAL DATA:  Abdominal pain, acute, nonlocalized. EXAM: CT ABDOMEN AND PELVIS WITH CONTRAST TECHNIQUE: Multidetector CT imaging of the abdomen and pelvis was performed using the standard protocol following bolus administration of intravenous contrast. RADIATION DOSE REDUCTION: This exam was performed according to the departmental dose-optimization program which includes automated exposure control, adjustment of the mA and/or kV according to patient size and/or use of iterative reconstruction technique. CONTRAST:  75mL OMNIPAQUE IOHEXOL 350 MG/ML SOLN COMPARISON:  None Available. FINDINGS: Lower chest: Mild bibasilar atelectasis. 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 Extensive multi-vessel coronary artery calcification. Global cardiac size within normal limits. No pericardial effusion. Hepatobiliary: No focal liver abnormality is seen. Status post cholecystectomy. No biliary dilatation. Pancreas: Atrophic but otherwise unremarkable. Spleen: Unremarkable Adrenals/Urinary Tract: The adrenal glands are unremarkable. The kidneys are normal in size and position. Mild bilateral hydronephrosis. There is superimposed urothelial enhancement in keeping with inflammation. No intrarenal or ureteral calculi. No perinephric fluid collections. No  enhancing intrarenal masses. The bladder is markedly thick walled, hyperemic, and demonstrates extensive perivesicular inflammatory stranding in keeping with changes a diffuse infectious or inflammatory cystitis. Foley catheter balloon is seen within the bladder lumen which is largely decompressed. No perivesicular fluid collections are identified. Stomach/Bowel: The stomach, small bowel, and large bowel are unremarkable save for mild descending colonic diverticulosis. There is severe circumferential bowel wall thickening involving the mid and distal rectum as well as extensive perirectal edema suggesting changes of a infectious or inflammatory proctitis. No evidence of obstruction. No free intraperitoneal gas or fluid. The appendix is normal. Vascular/Lymphatic: Aortic atherosclerosis. No enlarged abdominal or pelvic lymph nodes. Reproductive: Prostate is unremarkable. Other: Moderate fat containing supraumbilical ventral hernia is present. Tiny fat containing right inguinal hernia. Musculoskeletal: Probable L4 vertebral hemangioma. No suspicious lytic or blastic bone lesions are identified. No acute bone abnormality. IMPRESSION: 1. Changes in keeping with severe infectious or inflammatory cystitis. Foley catheter balloon is seen within the bladder lumen which is largely decompressed. No perivesicular fluid collections are identified. 2. Mild bilateral hydronephrosis with superimposed urothelial enhancement in keeping with inflammation, possibly reflecting an ascending urinary tract infection given the findings within the bladder. No intrarenal or ureteral calculi. 3. Severe circumferential bowel wall thickening involving the mid and distal rectum as well as extensive perirectal edema suggesting changes of a infectious or inflammatory proctitis. No obstruction or perforation. 4. Extensive multi-vessel coronary artery calcification. 5. Aortic atherosclerosis. Aortic Atherosclerosis (ICD10-I70.0). Electronically  Signed   By: Fidela Salisbury M.D.   On: 10/18/2022 01:07   DG Chest 2 View  Result Date: 10/18/2022 CLINICAL DATA:  Hypoxia. EXAM: CHEST - 2 VIEW COMPARISON:  [id number]. FINDINGS: The heart size and mediastinal contours are within normal limits. A TAVR stent is noted. There is atherosclerotic calcification of the aorta. Both lungs are clear. Degenerative changes are present in the thoracic spine. No acute osseous abnormality. IMPRESSION: No active cardiopulmonary disease. Electronically Signed   By: Brett Fairy M.D.   On: 10/18/2022 00:01     LOS: 1 day   Oren Binet, MD  [facility]    To contact the attending provider between 7A-7P or the covering provider during after hours 7P-7A, please log into the web site [email or web address] and access using universal [place] password for that web site. If you do not have the password, please call the hospital operator.  10/19/2022, 12:57 PM

## 2022-10-20 DIAGNOSIS — A419 Sepsis, unspecified organism: Secondary | ICD-10-CM | POA: Diagnosis not present

## 2022-10-20 DIAGNOSIS — N390 Urinary tract infection, site not specified: Secondary | ICD-10-CM | POA: Diagnosis not present

## 2022-10-20 DIAGNOSIS — I5032 Chronic diastolic (congestive) heart failure: Secondary | ICD-10-CM | POA: Diagnosis not present

## 2022-10-20 DIAGNOSIS — I10 Essential (primary) hypertension: Secondary | ICD-10-CM | POA: Diagnosis not present

## 2022-10-20 LAB — BASIC METABOLIC PANEL
Anion gap: 8 (ref 5–15)
BUN: 13 mg/dL (ref 8–23)
CO2: 28 mmol/L (ref 22–32)
Calcium: 8.6 mg/dL — ABNORMAL LOW (ref 8.9–10.3)
Chloride: 102 mmol/L (ref 98–111)
Creatinine, Ser: 0.47 mg/dL (ref 0.44–1.00)
GFR, Estimated: >60 mL/min (ref >60)
Glucose, Bld: 109 mg/dL — ABNORMAL HIGH (ref 70–99)
Potassium: 3.4 mmol/L — ABNORMAL LOW (ref 3.5–5.1)
Sodium: 138 mmol/L (ref 135–145)

## 2022-10-20 LAB — GLUCOSE, CAPILLARY
Glucose-Capillary: 111 mg/dL — ABNORMAL HIGH (ref 70–99)
Glucose-Capillary: 111 mg/dL — ABNORMAL HIGH (ref 70–99)
Glucose-Capillary: 137 mg/dL — ABNORMAL HIGH (ref 70–99)
Glucose-Capillary: 140 mg/dL — ABNORMAL HIGH (ref 70–99)
Glucose-Capillary: 140 mg/dL — ABNORMAL HIGH (ref 70–99)
Glucose-Capillary: 161 mg/dL — ABNORMAL HIGH (ref 70–99)

## 2022-10-20 LAB — MAGNESIUM: Magnesium: 1.5 mg/dL — ABNORMAL LOW (ref 1.7–2.4)

## 2022-10-20 MED ORDER — POTASSIUM CHLORIDE CRYS ER 20 MEQ PO TBCR
40.0000 meq | EXTENDED_RELEASE_TABLET | Freq: Once | ORAL | Status: AC
  Administered 2022-10-20: 40 meq via ORAL
  Filled 2022-10-20: qty 2

## 2022-10-20 MED ORDER — SODIUM CHLORIDE 0.9 % IV SOLN
2.0000 g | Freq: Two times a day (BID) | INTRAVENOUS | Status: DC
  Administered 2022-10-20 – 2022-10-22 (×5): 2 g via INTRAVENOUS
  Filled 2022-10-20 (×5): qty 12.5

## 2022-10-20 MED ORDER — MAGNESIUM SULFATE 4 GM/100ML IV SOLN
4.0000 g | Freq: Once | INTRAVENOUS | Status: AC
  Administered 2022-10-20: 4 g via INTRAVENOUS
  Filled 2022-10-20: qty 100

## 2022-10-20 NOTE — Progress Notes (Signed)
Pharmacy Antibiotic Note  [name] is a 76 y.o. female admitted on 10/17/2022 with UTI and cystitis.  Pharmacy  consulted for cefepime dosing.  Scr 0.47 today.  Renal function has improved to what appears is baseline (sCr 0.8-1). CrCl 50-60 mL/min.  11/10 Urine cx: 100k/ col/ml  Klebsiella and  30k col/ml E. coli , sensitivities pending 11/10 Blood Cx x2: ngtd x2d  Plan: Adjust Cefepime dose to  2g IV Q12H. Monitor renal function for further dose adjustments F/u cultures for de-escalation  Height: 5\' 4"  (162.6 cm) Weight: 74.3 kg (163 lb 12.8 oz) IBW/kg (Calculated) : 54.7  Temp (24hrs), Avg:98.6 F (37 C), Min:98 F (36.7 C), Max:99.4 F (37.4 C)  Recent Labs  Lab 10/17/22 2300 10/18/22 0140 10/18/22 0413 10/19/22 0118 10/20/22 0244  WBC 23.3*  --  17.3* 11.9*  --   CREATININE 1.30*  --  0.89 0.53 0.47  LATICACIDVEN 2.5* 1.6  --   --   --      Estimated Creatinine Clearance: 60 mL/min (by C-G formula based on SCr of 0.47 mg/dL).    Allergies  Allergen Reactions   Morphine Other (See Comments)    'took me out of this world' I had to be resuscitated   Codeine Sulfate Nausea Only and Other (See Comments)    GI upset and pain. "When to sleep and did not wake up".    Demerol [Meperidine] Itching, Nausea And Vomiting and Swelling   Propoxyphene Hcl Other (See Comments)    Darvocet caused sick headache/swelling     Thank you for allowing pharmacy to be a part of this patient's care. 13/12/23 Clinical Pharmacist 10/20/2022 2:39 PM

## 2022-10-20 NOTE — Progress Notes (Signed)
Pt. Unable to void, states feels uncomfortably, bladder scan 753, MD aware and order obtained to reinsert Foley, Foley re-inserted without difficulty, draining clear yellow urine  [name]

## 2022-10-20 NOTE — TOC Initial Note (Signed)
Transition of Care [facility]) - Initial/Assessment Note    Patient Details  Name: [name] MRN: [id number] Date of Birth: [birth year]  Transition of Care [facility]) CM/SW Contact:    Deatra Robinson, [place] Phone Number: 10/20/2022, 9:17 AM  Clinical Narrative:   Pt admitted from [facility] where she is a LTC resident and plans to return at dc. Per PT, pt is at functional baseline and has no skilled PT needs. SW will follow and assist with return to [facility] pending medical clearance.   Dellie Burns, MSW, LCSW [phone] (coverage)                  Expected Discharge Plan: Skilled Nursing Facility Barriers to Discharge: Continued Medical Work up   Patient Goals and CMS Choice        Expected Discharge Plan and Services Expected Discharge Plan: Skilled Nursing Facility     Post Acute Care Choice: Skilled Nursing Facility Living arrangements for the past 2 months: Skilled Nursing Facility                                      Prior Living Arrangements/Services Living arrangements for the past 2 months: Skilled Nursing Facility Lives with:: Facility Resident Patient language and need for interpreter reviewed:: No        Need for Family Participation in Patient Care: Yes (Comment) Care giver support system in place?: Yes (comment)   Criminal Activity/Legal Involvement Pertinent to Current Situation/Hospitalization: No - Comment as needed  Activities of Daily Living   ADL Screening (condition at time of admission) Patient's cognitive ability adequate to safely complete daily activities?: Yes Is the patient deaf or have difficulty hearing?: No Does the patient have difficulty seeing, even when wearing glasses/contacts?: No Does the patient have difficulty concentrating, remembering, or making decisions?: Yes Patient able to express need for assistance with ADLs?: Yes Does the patient have difficulty dressing or bathing?: Yes Independently  performs ADLs?: No Communication: Independent Dressing (OT): Needs assistance Is this a change from baseline?: Pre-admission baseline Grooming: Needs assistance Is this a change from baseline?: Pre-admission baseline Feeding: Independent Bathing: Needs assistance Is this a change from baseline?: Pre-admission baseline Toileting: Needs assistance Is this a change from baseline?: Pre-admission baseline In/Out Bed: Needs assistance Is this a change from baseline?: Pre-admission baseline Walks in Home: Needs assistance Is this a change from baseline?: Pre-admission baseline Does the patient have difficulty walking or climbing stairs?: Yes Weakness of Legs: Both Weakness of Arms/Hands: Both  Permission Sought/Granted                  Emotional Assessment       Orientation: : Oriented to Self, Oriented to Place, Oriented to  Time, Oriented to Situation Alcohol / Substance Use: Not Applicable Psych Involvement: No (comment)  Admission diagnosis:  Pyelonephritis [N12] AKI (acute kidney injury) (HCC) [N17.9] Complicated UTI (urinary tract infection) [N39.0] Sepsis, due to unspecified organism, unspecified whether acute organ dysfunction present [facility]) [A41.9] Patient Active Problem List   Diagnosis Date Noted   Complicated UTI (urinary tract infection) 10/18/2022   Acute proctitis 10/18/2022   Severe sepsis (HCC) 10/18/2022   Altered mental status 10/18/2022   Acute hypoxemic respiratory failure (HCC) 10/18/2022   Heart failure with preserved ejection fraction (HCC) 10/18/2022   Pressure injury of back, stage 1 05/21/2022   Femur fracture (HCC) 05/19/2022   Leucocytosis 05/19/2022  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, limited to breakdown of skin (HCC) 01/03/2022   Fall at home 07/09/2021   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   Near syncope 05/11/2020   Pressure injury of skin 05/11/2020   Orthostatic  hypotension 05/11/2020   Dental caries 05/11/2020   Aortic stenosis    Diabetic neuropathy (HCC) 11/13/2018   Left knee pain 10/24/2017   Greater trochanteric bursitis of left hip 10/24/2017   Atypical chest pain 10/10/2015   AKI (acute kidney injury) (HCC) 12/19/2014   Reactive airway disease 04/09/2014   Seasonal allergic rhinitis 04/09/2014   Preventative health care 04/09/2014   Abnormality of gait 03/17/2013   Irritable bowel syndrome (IBS) 08/27/2012   Shoulder pain 09/25/2009   Type 2 diabetes mellitus (HCC) 06/18/2007   MDD (major depressive disorder) 06/18/2007   GERD 06/18/2007   HLD (hyperlipidemia) 12/27/2006   Essential hypertension 09/19/2006   Venous (peripheral) insufficiency 09/19/2006   PCP:  Caesar Bookman, NP Pharmacy:   [facility] [address] [place], [address] [place] [address] Phone: [phone] Fax: [phone]  [facility] - [place], [place] - [address] [place] [address] Phone: [phone] Fax: [phone]  [facility] [address] [place] [address] Phone: [phone] Fax: [phone]     Social Determinants of Health (SDOH) Interventions    Readmission Risk Interventions     No data to display

## 2022-10-20 NOTE — Plan of Care (Signed)

## 2022-10-20 NOTE — Progress Notes (Signed)
PROGRESS NOTE        PATIENT DETAILS Name: [name] Age: 76 y.o. Sex: female Date of Birth: 05-Nov-1946 Admit Date: 10/17/2022 Admitting Physician Shela Leff, MD SX:[id number], [name], NP  Brief Summary: Patient is a 76 y.o.  female with history of DM-2, HTN, HLD, aortic stenosis s/p TAVR, HFpEF, chronic lower extremity lymphedema-who was recently diagnosed with UTI at [facility] on IM Rocephin-sent from SNF for worsening confusion-she was thought to have sepsis in the setting of complicated UTI and subsequently admitted to hospitalist service.  Significant events: 11/9>> admit to [facility]-severe sepsis-complicated UTI/proctitis.  Significant studies: 11/09>> CXR: No PNA 11/10>> CT abdomen/pelvis: Cystitis/bilateral hydronephrosis/proctitis without obstruction or perforation. 11/10>> CT head: No acute intracranial abnormality.  Significant microbiology data: 11/10>> blood culture: No growth 11/10>> urine culture: Klebsiella/E. coli  Procedures: None  Consults: None  Subjective: Upset that she is hospitalized Wants to go back to her facility Awake/alert Diarrhea has resolved Minimal pain with defecation at this point  Objective: Vitals: Blood pressure (!) 140/74, pulse 74, temperature 98.3 F (36.8 C), temperature source Oral, resp. rate 18, height 5\' 4"  (1.626 m), weight 74.3 kg, SpO2 95 %.   Exam: Gen Exam:Alert awake-not in any distress HEENT:atraumatic, normocephalic Chest: B/L clear to auscultation anteriorly CVS:S1S2 regular Abdomen:soft non tender, non distended Extremities:no edema Neurology: Non focal Skin: no rash  Pertinent Labs/Radiology:    Latest Ref Rng & Units 10/19/2022    1:18 AM 10/18/2022    4:13 AM 10/17/2022   11:00 PM  CBC  WBC 4.0 - 10.5 K/uL 11.9  17.3  23.3   Hemoglobin 12.0 - 15.0 g/dL 9.2  8.8  11.8   Hematocrit 36.0 - 46.0 % 29.3  26.6  35.5   Platelets 150 - 400 K/uL 165  169  244      Lab Results  Component Value Date   NA 138 10/20/2022   K 3.4 (L) 10/20/2022   CL 102 10/20/2022   CO2 28 10/20/2022     Assessment/Plan: Severe sepsis due to complicated UTI (cystitis with pyelonephritis) Acute proctitis Sepsis physiology resolved Urine culture positive for Klebsiella/E. coli  Continue cefepime  Await sensitivities-and will narrow antibiotics accordingly.    Acute metabolic encephalopathy Due to severe sepsis Significantly better-encephalopathy resolved. Maintain delirium precautions.  ?Acute hypoxic respiratory failure Likely error with pulse ox-hypoxia ruled out She has no respiratory symptoms On room air.  AKI Hemodynamically mediated AKI has resolved-supportive care for now.  ?  Urinary retention-s/p indwelling Foley catheter placed at SNF prior to hospitalization Son-he is not sure exactly when catheter was placed Will attempt voiding trial today-new Foley catheter  Hypokalemia Replete/recheck.  Hyponatremia Resolved.  Normocytic anemia Chronic issue since June of this year and will likely worsen due to acute illness. No evidence of blood loss Follow CBC.  Chronic HFpEF Stable/euvolemic-chronic lymphedema at baseline. Continue Demadex  Chronic bilateral lymphedema Appears to be at baseline Continue Demadex  History of aortic stenosis-s/p TAVR 2021  HTN BP stable Continue metoprolol.  DM-2 (A1c> 14.0 on 5/17) CBGs stable Continue Semglee 14 units daily/SSI Follow/optimize.  Recent Labs    10/20/22 0031 10/20/22 0510 10/20/22 0758  GLUCAP 140* 137* 111*     HLD Resume statin/Zetia  GERD PPI  Peripheral neuropathy Continue Neurontin  Depression Continue Celexa  Frailty/debility/deconditioning PT/OT eval Currently at SNF  Obesity: Estimated  body mass index is 28.12 kg/m as calculated from the following:   Height as of this encounter: 5\' 4"  (1.626 m).   Weight as of this encounter: 74.3 kg.    Code  status:   Code Status: DNR   DVT Prophylaxis: enoxaparin (LOVENOX) injection 40 mg Start: 10/18/22 1000   Family Communication:  Son-Harold Cassarino-([phone]-updated over the phone 11/12   Disposition Plan: Status is: Inpatient Remains inpatient appropriate because: Resolving sepsis physiology-not yet stable for discharge.   Planned Discharge Destination:Skilled nursing facility likely on Monday.   Diet: Diet Order             Diet heart healthy/carb modified Room service appropriate? Yes; Fluid consistency: Thin  Diet effective now                     Antimicrobial agents: Anti-infectives (From admission, onward)    Start     Dose/Rate Route Frequency Ordered Stop   10/19/22 2200  metroNIDAZOLE (FLAGYL) tablet 500 mg        500 mg Oral Every 12 hours 10/19/22 1738     10/19/22 0000  ceFEPIme (MAXIPIME) 2 g in sodium chloride 0.9 % 100 mL IVPB        2 g 200 mL/hr over 30 Minutes Intravenous Every 24 hours 10/18/22 0431     10/18/22 0145  metroNIDAZOLE (FLAGYL) IVPB 500 mg  Status:  Discontinued        500 mg 100 mL/hr over 60 Minutes Intravenous Every 12 hours 10/18/22 0140 10/19/22 1738   10/18/22 0130  ceFEPIme (MAXIPIME) 2 g in sodium chloride 0.9 % 100 mL IVPB        2 g 200 mL/hr over 30 Minutes Intravenous  Once 10/18/22 0115 10/18/22 0130        MEDICATIONS: Scheduled Meds:  atorvastatin  80 mg Oral Daily   Chlorhexidine Gluconate Cloth  6 each Topical Daily   citalopram  20 mg Oral Daily   enoxaparin (LOVENOX) injection  40 mg Subcutaneous Daily   ezetimibe  10 mg Oral Daily   gabapentin  300 mg Oral TID   insulin aspart  0-9 Units Subcutaneous Q4H   insulin glargine-yfgn  14 Units Subcutaneous QHS   metoprolol tartrate  12.5 mg Oral BID   metroNIDAZOLE  500 mg Oral Q12H   pantoprazole  40 mg Oral Daily   torsemide  20 mg Oral Daily   Continuous Infusions:  ceFEPime (MAXIPIME) IV 2 g (10/20/22 0203)   PRN Meds:.acetaminophen **OR**  acetaminophen, mouth rinse   I have personally reviewed following labs and imaging studies  LABORATORY DATA: CBC: Recent Labs  Lab 10/17/22 2300 10/18/22 0413 10/19/22 0118  WBC 23.3* 17.3* 11.9*  NEUTROABS 18.1*  --   --   HGB 11.8* 8.8* 9.2*  HCT 35.5* 26.6* 29.3*  MCV 85.3 85.8 88.5  PLT 244 169 165     Basic Metabolic Panel: Recent Labs  Lab 10/17/22 2300 10/18/22 0413 10/19/22 0118 10/20/22 0244  NA 134* 131* 133* 138  K 4.0 3.2* 3.4* 3.4*  CL 94* 98 100 102  CO2 30 26 23 28   GLUCOSE 186* 134* 193* 109*  BUN 51* 45* 27* 13  CREATININE 1.30* 0.89 0.53 0.47  CALCIUM 8.9 7.8* 8.0* 8.6*  MG  --   --   --  1.5*     GFR: Estimated Creatinine Clearance: 60 mL/min (by C-G formula based on SCr of 0.47 mg/dL).  Liver Function Tests: Recent  Labs  Lab 10/19/22 0118  AST 19  ALT 16  ALKPHOS 152*  BILITOT 0.4  PROT 5.2*  ALBUMIN 1.8*    No results for input(s): "LIPASE", "AMYLASE" in the last 168 hours. No results for input(s): "AMMONIA" in the last 168 hours.  Coagulation Profile: No results for input(s): "INR", "PROTIME" in the last 168 hours.  Cardiac Enzymes: No results for input(s): "CKTOTAL", "CKMB", "CKMBINDEX", "TROPONINI" in the last 168 hours.  BNP (last 3 results) No results for input(s): "PROBNP" in the last 8760 hours.  Lipid Profile: No results for input(s): "CHOL", "HDL", "LDLCALC", "TRIG", "CHOLHDL", "LDLDIRECT" in the last 72 hours.  Thyroid Function Tests: No results for input(s): "TSH", "T4TOTAL", "FREET4", "T3FREE", "THYROIDAB" in the last 72 hours.  Anemia Panel: No results for input(s): "VITAMINB12", "FOLATE", "FERRITIN", "TIBC", "IRON", "RETICCTPCT" in the last 72 hours.  Urine analysis:    Component Value Date/Time   COLORURINE AMBER (A) 10/18/2022 0004   APPEARANCEUR TURBID (A) 10/18/2022 0004   LABSPEC 1.017 10/18/2022 0004   PHURINE 5.0 10/18/2022 0004   GLUCOSEU NEGATIVE 10/18/2022 0004   HGBUR LARGE (A) 10/18/2022  0004   BILIRUBINUR NEGATIVE 10/18/2022 0004   BILIRUBINUR Neg 05/10/2019 1029   KETONESUR NEGATIVE 10/18/2022 0004   PROTEINUR >=300 (A) 10/18/2022 0004   UROBILINOGEN 0.2 05/10/2019 1029   UROBILINOGEN 0.2 12/19/2014 1425   NITRITE NEGATIVE 10/18/2022 0004   LEUKOCYTESUR MODERATE (A) 10/18/2022 0004    Sepsis Labs: Lactic Acid, Venous    Component Value Date/Time   LATICACIDVEN 1.6 10/18/2022 0140    MICROBIOLOGY: Recent Results (from the past 240 hour(s))  Blood culture (routine x 2)     Status: None (Preliminary result)   Collection Time: 10/17/22 11:00 PM   Specimen: BLOOD LEFT ARM  Result Value Ref Range Status   Specimen Description BLOOD LEFT ARM  Final   Special Requests   Final    BOTTLES DRAWN AEROBIC AND ANAEROBIC Blood Culture adequate volume   Culture   Final    NO GROWTH 2 DAYS Performed at [facility], [address], [place], [place] [address]    Report Status PENDING  Incomplete  Urine Culture     Status: Abnormal (Preliminary result)   Collection Time: 10/18/22 12:04 AM   Specimen: Urine, Catheterized  Result Value Ref Range Status   Specimen Description URINE, CATHETERIZED  Final   Special Requests NONE  Final   Culture (A)  Final    >=100,000 COLONIES/mL KLEBSIELLA PNEUMONIAE 30,000 COLONIES/mL ESCHERICHIA COLI SUSCEPTIBILITIES TO FOLLOW Performed at [facility], [address], [place], [place] [address]    Report Status PENDING  Incomplete  Blood culture (routine x 2)     Status: None (Preliminary result)   Collection Time: 10/18/22  1:45 AM   Specimen: BLOOD RIGHT ARM  Result Value Ref Range Status   Specimen Description BLOOD RIGHT ARM  Final   Special Requests   Final    BOTTLES DRAWN AEROBIC AND ANAEROBIC Blood Culture adequate volume   Culture   Final    NO GROWTH 2 DAYS Performed at [facility], [address] [place], [place] [address]    Report Status PENDING  Incomplete    RADIOLOGY STUDIES/RESULTS: No  results found.   LOS: 2 days   Jeoffrey Massed, MD  [facility]    To contact the attending provider between 7A-7P or the covering provider during after hours 7P-7A, please log into the web site [email or web address] and  access using universal [place] password for that web site. If you do not have the password, please call the hospital operator.  10/20/2022, 10:23 AM

## 2022-10-20 NOTE — NC FL2 (Signed)
[place] MEDICAID FL2 LEVEL OF CARE SCREENING TOOL     IDENTIFICATION  Patient Name: [name] Birthdate: 05-21-1946 Sex: female Admission Date (Current Location): 10/17/2022  [facility] and [place] Number:  Producer, television/film/video and Address:  [facility] [place], [address] [place], [place] [address]      Provider Number: [id number]  Attending Physician Name and Address:  Maretta Bees, MD  Relative Name and Phone Number:       Current Level of Care: Hospital Recommended Level of Care: Skilled Nursing Facility Prior Approval Number:    Date Approved/Denied:   PASRR Number: [id number]  Discharge Plan: SNF    Current Diagnoses: Patient Active Problem List   Diagnosis Date Noted   Complicated UTI (urinary tract infection) 10/18/2022   Acute proctitis 10/18/2022   Severe sepsis (HCC) 10/18/2022   Altered mental status 10/18/2022   Acute hypoxemic respiratory failure (HCC) 10/18/2022   Heart failure with preserved ejection fraction (HCC) 10/18/2022   Pressure injury of back, stage 1 05/21/2022   Femur fracture (HCC) 05/19/2022   Leucocytosis 05/19/2022   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, limited to breakdown of skin (HCC) 01/03/2022   Fall at home 07/09/2021   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   Near syncope 05/11/2020   Pressure injury of skin 05/11/2020   Orthostatic hypotension 05/11/2020   Dental caries 05/11/2020   Aortic stenosis    Diabetic neuropathy (HCC) 11/13/2018   Left knee pain 10/24/2017   Greater trochanteric bursitis of left hip 10/24/2017   Atypical chest pain 10/10/2015   AKI (acute kidney injury) (HCC) 12/19/2014   Reactive airway disease 04/09/2014   Seasonal allergic rhinitis 04/09/2014   Preventative health care 04/09/2014   Abnormality of gait 03/17/2013   Irritable bowel syndrome (IBS) 08/27/2012   Shoulder pain 09/25/2009   Type 2  diabetes mellitus (HCC) 06/18/2007   MDD (major depressive disorder) 06/18/2007   GERD 06/18/2007   HLD (hyperlipidemia) 12/27/2006   Essential hypertension 09/19/2006   Venous (peripheral) insufficiency 09/19/2006    Orientation RESPIRATION BLADDER Height & Weight     Self, Time, Situation, Place  Normal Indwelling catheter Weight: 163 lb 12.8 oz (74.3 kg) Height:  5\' 4"  (162.6 cm)  BEHAVIORAL SYMPTOMS/MOOD NEUROLOGICAL BOWEL NUTRITION STATUS           AMBULATORY STATUS COMMUNICATION OF NEEDS Skin   Extensive Assist Verbally PU Stage and Appropriate Care (stage 3 left heel)     PU Stage 3 Dressing:  (see notes)                 Personal Care Assistance Level of Assistance  Bathing, Feeding, Dressing Bathing Assistance: Maximum assistance Feeding assistance: Limited assistance Dressing Assistance: Maximum assistance     Functional Limitations Info  Sight, Hearing, Speech Sight Info: Adequate Hearing Info: Adequate Speech Info: Adequate    SPECIAL CARE FACTORS FREQUENCY                       Contractures Contractures Info: Not present    Additional Factors Info  Code Status Code Status Info: DNR             Current Medications (10/20/2022):  This is the current hospital active medication list Current Facility-Administered Medications  Medication Dose Route Frequency Provider Last Rate Last Admin   acetaminophen (TYLENOL) tablet 650 mg  650 mg Oral Q6H PRN 13/11/2022,  MD   650 mg at 10/20/22 7824   Or   acetaminophen (TYLENOL) suppository 650 mg  650 mg Rectal Q6H PRN John Giovanni, MD       atorvastatin (LIPITOR) tablet 80 mg  80 mg Oral Daily Maretta Bees, MD   80 mg at 10/20/22 0807   ceFEPIme (MAXIPIME) 2 g in sodium chloride 0.9 % 100 mL IVPB  2 g Intravenous Q24H Juliette Mangle, RPH 200 mL/hr at 10/20/22 0203 2 g at 10/20/22 0203   Chlorhexidine Gluconate Cloth 2 % PADS 6 each  6 each Topical Daily John Giovanni, MD   6  each at 10/18/22 1539   citalopram (CELEXA) tablet 20 mg  20 mg Oral Daily Maretta Bees, MD   20 mg at 10/20/22 0806   enoxaparin (LOVENOX) injection 40 mg  40 mg Subcutaneous Daily Maretta Bees, MD   40 mg at 10/20/22 0806   ezetimibe (ZETIA) tablet 10 mg  10 mg Oral Daily Maretta Bees, MD   10 mg at 10/20/22 0807   gabapentin (NEURONTIN) capsule 300 mg  300 mg Oral TID Maretta Bees, MD   300 mg at 10/20/22 0807   insulin aspart (novoLOG) injection 0-9 Units  0-9 Units Subcutaneous Q4H John Giovanni, MD   1 Units at 10/20/22 0100   insulin glargine-yfgn (SEMGLEE) injection 14 Units  14 Units Subcutaneous QHS John Giovanni, MD   14 Units at 10/19/22 2203   magnesium sulfate IVPB 4 g 100 mL  4 g Intravenous Once Maretta Bees, MD 50 mL/hr at 10/20/22 0820 4 g at 10/20/22 0820   metoprolol tartrate (LOPRESSOR) tablet 12.5 mg  12.5 mg Oral BID Maretta Bees, MD   12.5 mg at 10/20/22 0807   metroNIDAZOLE (FLAGYL) tablet 500 mg  500 mg Oral Q12H Maretta Bees, MD   500 mg at 10/20/22 2353   Oral care mouth rinse  15 mL Mouth Rinse PRN Ghimire, Werner Lean, MD       pantoprazole (PROTONIX) EC tablet 40 mg  40 mg Oral Daily Maretta Bees, MD   40 mg at 10/20/22 6144   torsemide (DEMADEX) tablet 20 mg  20 mg Oral Daily Maretta Bees, MD   20 mg at 10/20/22 3154     Discharge Medications: Please see discharge summary for a list of discharge medications.  Relevant Imaging Results:  Relevant Lab Results:   Additional Information SSN [id number]  Dellie Burns [place], [place]

## 2022-10-21 DIAGNOSIS — N390 Urinary tract infection, site not specified: Secondary | ICD-10-CM | POA: Diagnosis not present

## 2022-10-21 DIAGNOSIS — I10 Essential (primary) hypertension: Secondary | ICD-10-CM | POA: Diagnosis not present

## 2022-10-21 DIAGNOSIS — I5032 Chronic diastolic (congestive) heart failure: Secondary | ICD-10-CM | POA: Diagnosis not present

## 2022-10-21 DIAGNOSIS — A419 Sepsis, unspecified organism: Secondary | ICD-10-CM | POA: Diagnosis not present

## 2022-10-21 LAB — BASIC METABOLIC PANEL
Anion gap: 6 (ref 5–15)
BUN: 17 mg/dL (ref 8–23)
CO2: 32 mmol/L (ref 22–32)
Calcium: 8.6 mg/dL — ABNORMAL LOW (ref 8.9–10.3)
Chloride: 100 mmol/L (ref 98–111)
Creatinine, Ser: 0.72 mg/dL (ref 0.44–1.00)
GFR, Estimated: >60 mL/min (ref >60)
Glucose, Bld: 236 mg/dL — ABNORMAL HIGH (ref 70–99)
Potassium: 3.3 mmol/L — ABNORMAL LOW (ref 3.5–5.1)
Sodium: 138 mmol/L (ref 135–145)

## 2022-10-21 LAB — URINE CULTURE: Culture: >=100000 — AB

## 2022-10-21 LAB — GLUCOSE, CAPILLARY
Glucose-Capillary: 120 mg/dL — ABNORMAL HIGH (ref 70–99)
Glucose-Capillary: 121 mg/dL — ABNORMAL HIGH (ref 70–99)
Glucose-Capillary: 127 mg/dL — ABNORMAL HIGH (ref 70–99)
Glucose-Capillary: 184 mg/dL — ABNORMAL HIGH (ref 70–99)
Glucose-Capillary: 185 mg/dL — ABNORMAL HIGH (ref 70–99)
Glucose-Capillary: 196 mg/dL — ABNORMAL HIGH (ref 70–99)
Glucose-Capillary: 228 mg/dL — ABNORMAL HIGH (ref 70–99)

## 2022-10-21 LAB — MAGNESIUM: Magnesium: 1.8 mg/dL (ref 1.7–2.4)

## 2022-10-21 MED ORDER — POTASSIUM CHLORIDE CRYS ER 20 MEQ PO TBCR
40.0000 meq | EXTENDED_RELEASE_TABLET | ORAL | Status: AC
  Administered 2022-10-21 (×2): 40 meq via ORAL
  Filled 2022-10-21 (×2): qty 2

## 2022-10-21 NOTE — Plan of Care (Signed)

## 2022-10-21 NOTE — Care Management Important Message (Signed)
Important Message  Patient Details  Name: [name] MRN: [id number] Date of Birth: [birth year]   Medicare Important Message Given:  Yes     Dorena Bodo 10/21/2022, 3:58 PM

## 2022-10-21 NOTE — Progress Notes (Signed)
PROGRESS NOTE        PATIENT DETAILS Name: [name] Age: 76 y.o. Sex: female Date of Birth: Apr 21, 1946 Admit Date: 10/17/2022 Admitting Physician Cynthia Giovanni, MD [id number], Cynthia Citrin, NP  Brief Summary: Patient is a 76 y.o.  female with history of DM-2, HTN, HLD, aortic stenosis s/p TAVR, HFpEF, chronic lower extremity lymphedema-who was recently diagnosed with UTI at [facility] on IM Rocephin-sent from SNF for worsening confusion-she was thought to have sepsis in the setting of complicated UTI and subsequently admitted to hospitalist service.  Significant events: 11/9>> admit to [facility]-severe sepsis-complicated UTI/proctitis. 11/12>> failed voiding trial-Foley catheter reinserted  Significant studies: 11/09>> CXR: No PNA 11/10>> CT abdomen/pelvis: Cystitis/bilateral hydronephrosis/proctitis without obstruction or perforation. 11/10>> CT head: No acute intracranial abnormality.  Significant microbiology data: 11/10>> blood culture: No growth 11/10>> urine culture: Klebsiella/E. coli  Procedures: None  Consults: None  Subjective: Continues some mild dysuria-but overall much improved. No diarrhea No further pain with defecation Requesting that she be discharged. Failed voiding trial yesterday-Foley catheter was reinserted.  Objective: Vitals: Blood pressure (!) 132/53, pulse 67, temperature 98 F (36.7 C), resp. rate 17, height 5\' 4"  (1.626 m), weight 74.3 kg, SpO2 94 %.   Exam: Gen Exam:Alert awake-not in any distress HEENT:atraumatic, normocephalic Chest: B/L clear to auscultation anteriorly CVS:S1S2 regular Abdomen:soft non tender, non distended Extremities:no edema Neurology: Non focal Skin: no rash  Pertinent Labs/Radiology:    Latest Ref Rng & Units 10/19/2022    1:18 AM 10/18/2022    4:13 AM 10/17/2022   11:00 PM  CBC  WBC 4.0 - 10.5 K/uL 11.9  17.3  23.3   Hemoglobin 12.0 - 15.0 g/dL 9.2  8.8  13/08/2022    Hematocrit 36.0 - 46.0 % 29.3  26.6  35.5   Platelets 150 - 400 K/uL 165  169  244     Lab Results  Component Value Date   NA 138 10/21/2022   K 3.3 (L) 10/21/2022   CL 100 10/21/2022   CO2 32 10/21/2022     Assessment/Plan: Severe sepsis due to complicated UTI (cystitis with pyelonephritis) Acute proctitis Sepsis physiology resolved Continues-but no further proctalgia.  Had very minimal diarrhea when she presented that has since resolved Urine culture positive for ESBL Klebsiella and E. coli.   Clinically has improved with just cefepime (E. coli sensitive but Klebsiella resistant) Unclear if the Klebsiella is a contaminant-we will need to discuss with infectious disease before switching to antibiotics to a Carbapenem.  Acute metabolic encephalopathy Due to severe sepsis Significantly better-encephalopathy resolved. Maintain delirium precautions.  ?Acute hypoxic respiratory failure Likely error with pulse ox-hypoxia ruled out She has no respiratory symptoms On room air.  AKI Hemodynamically mediated AKI has resolved-supportive care for now.  Acute urinary retention-s/p indwelling Foley catheter placed at SNF prior to hospitalization Apparently had a Foley catheter placed a few days prior to this hospitalization Voiding trial attempted yesterday-unsuccessful-Foley catheter reinserted Will need outpatient voiding trial.  Hypokalemia Replete/recheck.  Hyponatremia Resolved.  Normocytic anemia Chronic issue since June of this year and will likely worsen due to acute illness. No evidence of blood loss Follow CBC.  Chronic HFpEF Stable/euvolemic-chronic lymphedema at baseline. Continue Demadex  Chronic bilateral lymphedema Appears to be at baseline Continue Demadex  History of aortic stenosis-s/p TAVR 2021  HTN BP stable Continue metoprolol.  DM-2 (A1c> 14.0 on  5/17) CBGs stable Continue Semglee 14 units daily/SSI Follow/optimize.  Recent Labs     10/21/22 0501 10/21/22 0728 10/21/22 1117  GLUCAP 184* 121* 127*     HLD Resume statin/Zetia  GERD PPI  Peripheral neuropathy Continue Neurontin  Depression Continue Celexa  Frailty/debility/deconditioning PT/OT eval Currently at SNF  Obesity: Estimated body mass index is 28.12 kg/m as calculated from the following:   Height as of this encounter: 5\' 4"  (1.626 m).   Weight as of this encounter: 74.3 kg.    Code status:   Code Status: DNR   DVT Prophylaxis: enoxaparin (LOVENOX) injection 40 mg Start: 10/18/22 1000   Family Communication:  Son-[name]-updated over the phone 11/13   Disposition Plan: Status is: Inpatient Remains inpatient appropriate because: Resolving sepsis physiology-not yet stable for discharge.   Planned Discharge Destination:Skilled nursing facility in the next day or so.   Diet: Diet Order             Diet heart healthy/carb modified Room service appropriate? Yes; Fluid consistency: Thin  Diet effective now                     Antimicrobial agents: Anti-infectives (From admission, onward)    Start     Dose/Rate Route Frequency Ordered Stop   10/20/22 1530  ceFEPIme (MAXIPIME) 2 g in sodium chloride 0.9 % 100 mL IVPB        2 g 200 mL/hr over 30 Minutes Intravenous Every 12 hours 10/20/22 1438     10/19/22 2200  metroNIDAZOLE (FLAGYL) tablet 500 mg        500 mg Oral Every 12 hours 10/19/22 1738     10/19/22 0000  ceFEPIme (MAXIPIME) 2 g in sodium chloride 0.9 % 100 mL IVPB  Status:  Discontinued        2 g 200 mL/hr over 30 Minutes Intravenous Every 24 hours 10/18/22 0431 10/20/22 1438   10/18/22 0145  metroNIDAZOLE (FLAGYL) IVPB 500 mg  Status:  Discontinued        500 mg 100 mL/hr over 60 Minutes Intravenous Every 12 hours 10/18/22 0140 10/19/22 1738   10/18/22 0130  ceFEPIme (MAXIPIME) 2 g in sodium chloride 0.9 % 100 mL IVPB        2 g 200 mL/hr over 30 Minutes Intravenous  Once 10/18/22 0115  10/18/22 0130        MEDICATIONS: Scheduled Meds:  atorvastatin  80 mg Oral Daily   Chlorhexidine Gluconate Cloth  6 each Topical Daily   citalopram  20 mg Oral Daily   enoxaparin (LOVENOX) injection  40 mg Subcutaneous Daily   ezetimibe  10 mg Oral Daily   gabapentin  300 mg Oral TID   insulin aspart  0-9 Units Subcutaneous Q4H   insulin glargine-yfgn  14 Units Subcutaneous QHS   metoprolol tartrate  12.5 mg Oral BID   metroNIDAZOLE  500 mg Oral Q12H   pantoprazole  40 mg Oral Daily   potassium chloride  40 mEq Oral Q4H   torsemide  20 mg Oral Daily   Continuous Infusions:  ceFEPime (MAXIPIME) IV 2 g (10/21/22 0903)   PRN Meds:.acetaminophen **OR** acetaminophen, mouth rinse   I have personally reviewed following labs and imaging studies  LABORATORY DATA: CBC: Recent Labs  Lab 10/17/22 2300 10/18/22 0413 10/19/22 0118  WBC 23.3* 17.3* 11.9*  NEUTROABS 18.1*  --   --   HGB 11.8* 8.8* 9.2*  HCT 35.5* 26.6* 29.3*  MCV 85.3  85.8 88.5  PLT 244 169 165     Basic Metabolic Panel: Recent Labs  Lab 10/17/22 2300 10/18/22 0413 10/19/22 0118 10/20/22 0244 10/21/22 0248  NA 134* 131* 133* 138 138  K 4.0 3.2* 3.4* 3.4* 3.3*  CL 94* 98 100 102 100  CO2 30 26 23 28  32  GLUCOSE 186* 134* 193* 109* 236*  BUN 51* 45* 27* 13 17  CREATININE 1.30* 0.89 0.53 0.47 0.72  CALCIUM 8.9 7.8* 8.0* 8.6* 8.6*  MG  --   --   --  1.5* 1.8     GFR: Estimated Creatinine Clearance: 60 mL/min (by C-G formula based on SCr of 0.72 mg/dL).  Liver Function Tests: Recent Labs  Lab 10/19/22 0118  AST 19  ALT 16  ALKPHOS 152*  BILITOT 0.4  PROT 5.2*  ALBUMIN 1.8*    No results for input(s): "LIPASE", "AMYLASE" in the last 168 hours. No results for input(s): "AMMONIA" in the last 168 hours.  Coagulation Profile: No results for input(s): "INR", "PROTIME" in the last 168 hours.  Cardiac Enzymes: No results for input(s): "CKTOTAL", "CKMB", "CKMBINDEX", "TROPONINI" in the last  168 hours.  BNP (last 3 results) No results for input(s): "PROBNP" in the last 8760 hours.  Lipid Profile: No results for input(s): "CHOL", "HDL", "LDLCALC", "TRIG", "CHOLHDL", "LDLDIRECT" in the last 72 hours.  Thyroid Function Tests: No results for input(s): "TSH", "T4TOTAL", "FREET4", "T3FREE", "THYROIDAB" in the last 72 hours.  Anemia Panel: No results for input(s): "VITAMINB12", "FOLATE", "FERRITIN", "TIBC", "IRON", "RETICCTPCT" in the last 72 hours.  Urine analysis:    Component Value Date/Time   COLORURINE AMBER (A) 10/18/2022 0004   APPEARANCEUR TURBID (A) 10/18/2022 0004   LABSPEC 1.017 10/18/2022 0004   PHURINE 5.0 10/18/2022 0004   GLUCOSEU NEGATIVE 10/18/2022 0004   HGBUR LARGE (A) 10/18/2022 0004   BILIRUBINUR NEGATIVE 10/18/2022 0004   BILIRUBINUR Neg 05/10/2019 1029   KETONESUR NEGATIVE 10/18/2022 0004   PROTEINUR >=300 (A) 10/18/2022 0004   UROBILINOGEN 0.2 05/10/2019 1029   UROBILINOGEN 0.2 12/19/2014 1425   NITRITE NEGATIVE 10/18/2022 0004   LEUKOCYTESUR MODERATE (A) 10/18/2022 0004    Sepsis Labs: Lactic Acid, Venous    Component Value Date/Time   LATICACIDVEN 1.6 10/18/2022 0140    MICROBIOLOGY: Recent Results (from the past 240 hour(s))  Blood culture (routine x 2)     Status: None (Preliminary result)   Collection Time: 10/17/22 11:00 PM   Specimen: BLOOD LEFT ARM  Result Value Ref Range Status   Specimen Description BLOOD LEFT ARM  Final   Special Requests   Final    BOTTLES DRAWN AEROBIC AND ANAEROBIC Blood Culture adequate volume   Culture   Final    NO GROWTH 3 DAYS Performed at [facility], [address] [place], [place]    Report Status PENDING  Incomplete  Urine Culture     Status: Abnormal   Collection Time: 10/18/22 12:04 AM   Specimen: Urine, Catheterized  Result Value Ref Range Status   Specimen Description URINE, CATHETERIZED  Final   Special Requests NONE  Final   Culture (A)  Final    >=100,000  COLONIES/mL KLEBSIELLA PNEUMONIAE 30,000 COLONIES/mL ESCHERICHIA COLI Confirmed Extended Spectrum Beta-Lactamase Producer (ESBL).  In bloodstream infections from ESBL organisms, carbapenems are preferred over piperacillin/tazobactam. They are shown to have a lower risk of mortality. FOR KLEBSIELLA PNEUMONIAE Performed at [facility], [address], [place], [place]    Report  Status 10/21/2022 FINAL  Final   Organism ID, Bacteria ESCHERICHIA COLI (A)  Final   Organism ID, Bacteria KLEBSIELLA PNEUMONIAE (A)  Final      Susceptibility   Escherichia coli - MIC*    AMPICILLIN >=32 RESISTANT Resistant     CEFAZOLIN >=64 RESISTANT Resistant     CEFEPIME <=0.12 SENSITIVE Sensitive     CEFTRIAXONE 1 SENSITIVE Sensitive     CIPROFLOXACIN <=0.25 SENSITIVE Sensitive     GENTAMICIN <=1 SENSITIVE Sensitive     IMIPENEM <=0.25 SENSITIVE Sensitive     NITROFURANTOIN <=16 SENSITIVE Sensitive     TRIMETH/SULFA <=20 SENSITIVE Sensitive     AMPICILLIN/SULBACTAM >=32 RESISTANT Resistant     PIP/TAZO 8 SENSITIVE Sensitive     * 30,000 COLONIES/mL ESCHERICHIA COLI   Klebsiella pneumoniae - MIC*    AMPICILLIN >=32 RESISTANT Resistant     CEFAZOLIN >=64 RESISTANT Resistant     CEFEPIME >=32 RESISTANT Resistant     CEFTRIAXONE >=64 RESISTANT Resistant     CIPROFLOXACIN 1 RESISTANT Resistant     GENTAMICIN <=1 SENSITIVE Sensitive     IMIPENEM 2 SENSITIVE Sensitive     NITROFURANTOIN 64 INTERMEDIATE Intermediate     TRIMETH/SULFA >=320 RESISTANT Resistant     AMPICILLIN/SULBACTAM >=32 RESISTANT Resistant     PIP/TAZO 16 SENSITIVE Sensitive     * >=100,000 COLONIES/mL KLEBSIELLA PNEUMONIAE  Blood culture (routine x 2)     Status: None (Preliminary result)   Collection Time: 10/18/22  1:45 AM   Specimen: BLOOD RIGHT ARM  Result Value Ref Range Status   Specimen Description BLOOD RIGHT ARM  Final   Special Requests   Final    BOTTLES DRAWN AEROBIC AND ANAEROBIC Blood Culture adequate  volume   Culture   Final    NO GROWTH 3 DAYS Performed at [facility], [address] [place], [place] [address]    Report Status PENDING  Incomplete    RADIOLOGY STUDIES/RESULTS: No results found.   LOS: 3 days   Jeoffrey MassedShanker Yoav Okane, MD  [facility]    To contact the attending provider between 7A-7P or the covering provider during after hours 7P-7A, please log into the web site [email or web address] and access using universal [place] password for that web site. If you do not have the password, please call the hospital operator.  10/21/2022, 11:42 AM

## 2022-10-22 DIAGNOSIS — I10 Essential (primary) hypertension: Secondary | ICD-10-CM | POA: Diagnosis not present

## 2022-10-22 DIAGNOSIS — I5032 Chronic diastolic (congestive) heart failure: Secondary | ICD-10-CM | POA: Diagnosis not present

## 2022-10-22 DIAGNOSIS — N390 Urinary tract infection, site not specified: Secondary | ICD-10-CM | POA: Diagnosis not present

## 2022-10-22 DIAGNOSIS — A419 Sepsis, unspecified organism: Secondary | ICD-10-CM | POA: Diagnosis not present

## 2022-10-22 LAB — BASIC METABOLIC PANEL
Anion gap: 6 (ref 5–15)
BUN: 16 mg/dL (ref 8–23)
CO2: 30 mmol/L (ref 22–32)
Calcium: 8.3 mg/dL — ABNORMAL LOW (ref 8.9–10.3)
Chloride: 100 mmol/L (ref 98–111)
Creatinine, Ser: 0.81 mg/dL (ref 0.44–1.00)
GFR, Estimated: >60 mL/min (ref >60)
Glucose, Bld: 209 mg/dL — ABNORMAL HIGH (ref 70–99)
Potassium: 4.2 mmol/L (ref 3.5–5.1)
Sodium: 136 mmol/L (ref 135–145)

## 2022-10-22 LAB — MAGNESIUM: Magnesium: 1.6 mg/dL — ABNORMAL LOW (ref 1.7–2.4)

## 2022-10-22 LAB — GLUCOSE, CAPILLARY
Glucose-Capillary: 125 mg/dL — ABNORMAL HIGH (ref 70–99)
Glucose-Capillary: 113 mg/dL — ABNORMAL HIGH (ref 70–99)
Glucose-Capillary: 180 mg/dL — ABNORMAL HIGH (ref 70–99)
Glucose-Capillary: 355 mg/dL — ABNORMAL HIGH (ref 70–99)
Glucose-Capillary: 168 mg/dL — ABNORMAL HIGH (ref 70–99)

## 2022-10-22 LAB — CBC
HCT: 36.3 % (ref 36.0–46.0)
Hemoglobin: 11.5 g/dL — ABNORMAL LOW (ref 12.0–15.0)
MCH: 27.6 pg (ref 26.0–34.0)
MCHC: 31.7 g/dL (ref 30.0–36.0)
MCV: 87.3 fL (ref 80.0–100.0)
Platelets: 237 10*3/uL (ref 150–400)
RBC: 4.16 MIL/uL (ref 3.87–5.11)
RDW: 14.9 % (ref 11.5–15.5)
WBC: 8.7 10*3/uL (ref 4.0–10.5)
nRBC: 0 % (ref 0.0–0.2)

## 2022-10-22 LAB — T4, FREE: Free T4: 1.46 ng/dL — ABNORMAL HIGH (ref 0.61–1.12)

## 2022-10-22 MED ORDER — SODIUM CHLORIDE 0.9 % IV BOLUS
500.0000 mL | Freq: Once | INTRAVENOUS | Status: AC
  Administered 2022-10-22: 500 mL via INTRAVENOUS

## 2022-10-22 MED ORDER — SULFAMETHOXAZOLE-TRIMETHOPRIM 800-160 MG PO TABS
1.0000 | ORAL_TABLET | Freq: Two times a day (BID) | ORAL | Status: DC
  Administered 2022-10-23 – 2022-10-24 (×3): 1 via ORAL
  Filled 2022-10-22 (×4): qty 1

## 2022-10-22 MED ORDER — METOPROLOL TARTRATE 5 MG/5ML IV SOLN
5.0000 mg | Freq: Four times a day (QID) | INTRAVENOUS | Status: DC | PRN
  Administered 2022-10-22 (×2): 5 mg via INTRAVENOUS
  Filled 2022-10-22 (×2): qty 5

## 2022-10-22 MED ORDER — METOPROLOL TARTRATE 25 MG PO TABS
25.0000 mg | ORAL_TABLET | Freq: Two times a day (BID) | ORAL | Status: DC
  Administered 2022-10-23 (×2): 25 mg via ORAL
  Filled 2022-10-22 (×4): qty 1

## 2022-10-22 MED ORDER — AMIODARONE LOAD VIA INFUSION
150.0000 mg | Freq: Once | INTRAVENOUS | Status: AC
  Administered 2022-10-22: 150 mg via INTRAVENOUS
  Filled 2022-10-22: qty 83.34

## 2022-10-22 MED ORDER — AMIODARONE HCL IN DEXTROSE 360-4.14 MG/200ML-% IV SOLN
30.0000 mg/h | INTRAVENOUS | Status: DC
  Filled 2022-10-22 (×4): qty 200

## 2022-10-22 MED ORDER — ADENOSINE 6 MG/2ML IV SOLN
6.0000 mg | Freq: Once | INTRAVENOUS | Status: AC
  Administered 2022-10-22: 6 mg via INTRAVENOUS
  Filled 2022-10-22 (×2): qty 2

## 2022-10-22 MED ORDER — ADENOSINE 6 MG/2ML IV SOLN
6.0000 mg | Freq: Once | INTRAVENOUS | Status: DC
  Filled 2022-10-22: qty 2

## 2022-10-22 MED ORDER — AMIODARONE LOAD VIA INFUSION: 150.0000 mg | Freq: Once | INTRAVENOUS | Status: DC

## 2022-10-22 MED ORDER — MAGNESIUM SULFATE 4 GM/100ML IV SOLN
4.0000 g | Freq: Once | INTRAVENOUS | Status: AC
  Administered 2022-10-22: 4 g via INTRAVENOUS
  Filled 2022-10-22: qty 100

## 2022-10-22 MED ORDER — AMIODARONE HCL IN DEXTROSE 360-4.14 MG/200ML-% IV SOLN
60.0000 mg/h | INTRAVENOUS | Status: AC
  Administered 2022-10-22 (×2): 60 mg/h via INTRAVENOUS
  Filled 2022-10-22 (×2): qty 200

## 2022-10-22 MED ORDER — SODIUM CHLORIDE 0.9 % IV BOLUS
250.0000 mL | Freq: Once | INTRAVENOUS | Status: AC
  Administered 2022-10-22: 250 mL via INTRAVENOUS

## 2022-10-22 NOTE — Consult Note (Signed)
[facility]    Date of Admission:  10/17/2022     Total days of antibiotics 5               Reason for Consult: Complicated UTI   Referring Provider: Dr. Jerral Ralph Primary Care Provider: Caesar Bookman, NP   ASSESSMENT:  [name] is a 76 y/o female admitted with urinary symptoms and imaging consistent with cystitis/pyelonephritis and also possibility of proctitis. Cultures positive for E. Coli and multidrug resistant Klebsiella. Has been on cefepime and metrondiazole with improvement since admission. Klebsiella is resistant to both antibiotics indicating likelihood of colonization with Klebsiella as opposed to infection. Given possibility of proctitis will change antibiotics to sulfamethoxazole-trimethoprim and metronidazole for a total of 5 additional days.   PLAN:  Change antibiotics to sulfamethoxazole-trimethoprim and metronidazole for 5 additional days.  Remaining medical and supportive care per primary team.   ID will sign off. Please re-consult if needed.    Principal Problem:   Complicated UTI (urinary tract infection) Active Problems:   Type 2 diabetes mellitus (HCC)   HLD (hyperlipidemia)   Essential hypertension   AKI (acute kidney injury) (HCC)   Acute proctitis   Severe sepsis (HCC)   Altered mental status   Acute hypoxemic respiratory failure (HCC)   Heart failure with preserved ejection fraction (HCC)    adenosine (ADENOCARD) IV  6 mg Intravenous Once   atorvastatin  80 mg Oral Daily   Chlorhexidine Gluconate Cloth  6 each Topical Daily   citalopram  20 mg Oral Daily   enoxaparin (LOVENOX) injection  40 mg Subcutaneous Daily   ezetimibe  10 mg Oral Daily   gabapentin  300 mg Oral TID   insulin aspart  0-9 Units Subcutaneous Q4H   insulin glargine-yfgn  14 Units Subcutaneous QHS   metoprolol tartrate  25 mg Oral BID   metroNIDAZOLE  500 mg Oral Q12H   pantoprazole  40 mg Oral Daily   [START ON 10/23/2022]  sulfamethoxazole-trimethoprim  1 tablet Oral Q12H   torsemide  20 mg Oral Daily     HPI: [name] is a 76 y.o. female with previous medical history of Type 2 diabetes, hypertension, aortic stenosis s/p TAVR, and chronic lower extremity lymphedema presenting from [facility] with intermittent confusion and malodorous urine.   [name] was diagnosed with UTI a few days prior to arrival and was treated with IM Ceftriaxone. Lab work completed at [facility] showed a WBC count of 23.1 and had a documented SpO2 of 87% on RA. Did have some vague abdominal pain and buttock pain. Chest x-ray with no active cardiopulmonary disease. CT abdomen/pelvis with severe infectious or inflammatory cystitis; mild bilateral hydronephrosis; and bowel wall thickening suggesting infectious or inflammatory proctitis. Started on Cefepime and Metronidzole.  [name] is on day 5 of antimicrobial therapy and has been afebrile and feeling better since arriving to the hospital. Denies any current dysuria or abdominal pain. She is alert and oriented to person and place. Blood cultures have had no growth to date and urine culture is positive for multidrug resistant Klebsiella and and E. Coli.    Review of Systems: Review of Systems  Constitutional:  Negative for chills, fever and weight loss.  Respiratory:  Negative for cough, shortness of breath and wheezing.   Cardiovascular:  Negative for chest pain and leg swelling.  Gastrointestinal:  Negative for abdominal pain, constipation, diarrhea, nausea and vomiting.  Skin:  Negative for  rash.     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 Laneta Simmers and Excell Seltzer   Venous insufficiency    Ventral hernia     Social History   Tobacco Use   Smoking status: Never   Smokeless tobacco: Never  Substance Use Topics   Alcohol use: No   Drug use: No    Family History  Problem Relation Age of Onset   Heart failure Mother        Died in 20s   Heart failure Father        Died in 109s   CVA Father    Ovarian cancer Sister    Diabetes Sister    Other Sister        died at birth   Other Brother        drowned   Diabetes Paternal Grandmother    Breast cancer Paternal Aunt    Colon cancer Neg Hx     Allergies  Allergen Reactions   Morphine Other (See Comments)    'took me out of this world' I had to be resuscitated   Codeine Sulfate Nausea Only and Other (See Comments)    GI upset and pain. "When to sleep and did not wake up".    Demerol [Meperidine] Itching, Nausea And Vomiting and Swelling   Propoxyphene Hcl Other (See Comments)    Darvocet caused sick headache/swelling     OBJECTIVE: Blood pressure 124/62, pulse 68, temperature 98 F (36.7 C), temperature source Oral, resp. rate 18, height 5\' 4"  (1.626 m), weight 74.3 kg, SpO2 94 %.  Physical Exam Constitutional:      General: She is not in acute distress.    Appearance: She is well-developed.  Cardiovascular:     Rate and Rhythm: Normal rate and regular rhythm.     Heart sounds: Normal heart sounds.  Pulmonary:     Effort: Pulmonary effort is normal.     Breath sounds: Normal breath sounds.  Skin:    General: Skin is warm and dry.  Neurological:     Mental Status: She is alert. She is disoriented.  Psychiatric:        Mood and Affect: Mood normal.     Lab Results Lab Results  Component Value  Date   WBC 11.9 (H) 10/19/2022   HGB 9.2 (L) 10/19/2022   HCT 29.3 (L) 10/19/2022   MCV 88.5 10/19/2022   PLT 165 10/19/2022    Lab Results  Component Value Date   CREATININE 0.72 10/21/2022   BUN 17 10/21/2022   NA 138 10/21/2022   K 3.3 (L) 10/21/2022   CL 100 10/21/2022   CO2 32 10/21/2022    Lab Results  Component Value Date   ALT 16 10/19/2022   AST 19 10/19/2022   ALKPHOS 152 (  H) 10/19/2022   BILITOT 0.4 10/19/2022     Microbiology: Recent Results (from the past 240 hour(s))  Blood culture (routine x 2)     Status: None (Preliminary result)   Collection Time: 10/17/22 11:00 PM   Specimen: BLOOD LEFT ARM  Result Value Ref Range Status   Specimen Description BLOOD LEFT ARM  Final   Special Requests   Final    BOTTLES DRAWN AEROBIC AND ANAEROBIC Blood Culture adequate volume   Culture   Final    NO GROWTH 4 DAYS Performed at [facility], [address], [place], [place] [address]    Report Status PENDING  Incomplete  Urine Culture     Status: Abnormal   Collection Time: 10/18/22 12:04 AM   Specimen: Urine, Catheterized  Result Value Ref Range Status   Specimen Description URINE, CATHETERIZED  Final   Special Requests NONE  Final   Culture (A)  Final    >=100,000 COLONIES/mL KLEBSIELLA PNEUMONIAE 30,000 COLONIES/mL ESCHERICHIA COLI Confirmed Extended Spectrum Beta-Lactamase Producer (ESBL).  In bloodstream infections from ESBL organisms, carbapenems are preferred over piperacillin/tazobactam. They are shown to have a lower risk of mortality. FOR KLEBSIELLA PNEUMONIAE Performed at [facility], [address], [place], [place] [address]    Report Status 10/21/2022 FINAL  Final   Organism ID, Bacteria ESCHERICHIA COLI (A)  Final   Organism ID, Bacteria KLEBSIELLA PNEUMONIAE (A)  Final      Susceptibility   Escherichia coli - MIC*    AMPICILLIN >=32 RESISTANT Resistant     CEFAZOLIN >=64 RESISTANT Resistant     CEFEPIME <=0.12 SENSITIVE Sensitive      CEFTRIAXONE 1 SENSITIVE Sensitive     CIPROFLOXACIN <=0.25 SENSITIVE Sensitive     GENTAMICIN <=1 SENSITIVE Sensitive     IMIPENEM <=0.25 SENSITIVE Sensitive     NITROFURANTOIN <=16 SENSITIVE Sensitive     TRIMETH/SULFA <=20 SENSITIVE Sensitive     AMPICILLIN/SULBACTAM >=32 RESISTANT Resistant     PIP/TAZO 8 SENSITIVE Sensitive     * 30,000 COLONIES/mL ESCHERICHIA COLI   Klebsiella pneumoniae - MIC*    AMPICILLIN >=32 RESISTANT Resistant     CEFAZOLIN >=64 RESISTANT Resistant     CEFEPIME >=32 RESISTANT Resistant     CEFTRIAXONE >=64 RESISTANT Resistant     CIPROFLOXACIN 1 RESISTANT Resistant     GENTAMICIN <=1 SENSITIVE Sensitive     IMIPENEM 2 SENSITIVE Sensitive     NITROFURANTOIN 64 INTERMEDIATE Intermediate     TRIMETH/SULFA >=320 RESISTANT Resistant     AMPICILLIN/SULBACTAM >=32 RESISTANT Resistant     PIP/TAZO 16 SENSITIVE Sensitive     * >=100,000 COLONIES/mL KLEBSIELLA PNEUMONIAE  Blood culture (routine x 2)     Status: None (Preliminary result)   Collection Time: 10/18/22  1:45 AM   Specimen: BLOOD RIGHT ARM  Result Value Ref Range Status   Specimen Description BLOOD RIGHT ARM  Final   Special Requests   Final    BOTTLES DRAWN AEROBIC AND ANAEROBIC Blood Culture adequate volume   Culture   Final    NO GROWTH 4 DAYS Performed at [facility], [address], [place], [place] [address]    Report Status PENDING  Incomplete     Marcos Eke, NP [facility]  10/22/2022  11:28 AM

## 2022-10-22 NOTE — Progress Notes (Signed)
Back in Narrow complex tachy-looks like prior rhythm-SVT. Heart rate in 130's BP systolic 80's-but asymptomatic D/w Cards-Dr Rudolpho Sevin Adenosine, if no response start Amio Cards to formally see as well. Stat labs ordered

## 2022-10-22 NOTE — Progress Notes (Signed)
  Amiodarone Drug - Drug Interaction Consult Note  Recommendations: Monitor for rhabdo while on statins Monitor for brady on metoprolol Monitor lytes  Amiodarone is metabolized by the cytochrome P450 system and therefore has the potential to cause many drug interactions. Amiodarone has an average plasma half-life of 50 days (range 20 to 100 days).   There is potential for drug interactions to occur several weeks or months after stopping treatment and the onset of drug interactions may be slow after initiating amiodarone.   [x]  Statins: Increased risk of myopathy. Simvastatin- restrict dose to 20mg  daily. Other statins: counsel patients to report any muscle pain or weakness immediately.  []  Anticoagulants: Amiodarone can increase anticoagulant effect. Consider warfarin dose reduction. Patients should be monitored closely and the dose of anticoagulant altered accordingly, remembering that amiodarone levels take several weeks to stabilize.  []  Antiepileptics: Amiodarone can increase plasma concentration of phenytoin, the dose should be reduced. Note that small changes in phenytoin dose can result in large changes in levels. Monitor patient and counsel on signs of toxicity.  [x]  Beta blockers: increased risk of bradycardia, AV block and myocardial depression. Sotalol - avoid concomitant use.  []   Calcium channel blockers (diltiazem and verapamil): increased risk of bradycardia, AV block and myocardial depression.  []   Cyclosporine: Amiodarone increases levels of cyclosporine. Reduced dose of cyclosporine is recommended.  []  Digoxin dose should be halved when amiodarone is started.  [x]  Diuretics: increased risk of cardiotoxicity if hypokalemia occurs.  []  Oral hypoglycemic agents (glyburide, glipizide, glimepiride): increased risk of hypoglycemia. Patient's glucose levels should be monitored closely when initiating amiodarone therapy.   []  Drugs that prolong the QT interval:  Torsades de  pointes risk may be increased with concurrent use - avoid if possible.  Monitor QTc, also keep magnesium/potassium WNL if concurrent therapy can't be avoided.  Antibiotics: e.g. fluoroquinolones, erythromycin.  Antiarrhythmics: e.g. quinidine, procainamide, disopyramide, sotalol.  Antipsychotics: e.g. phenothiazines, haloperidol.   Lithium, tricyclic antidepressants, and methadone.
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
## 2022-10-22 NOTE — Significant Event (Signed)
Rapid Response Event Note   Reason for Call :  SVT 150s  Initial Focused Assessment:  Patient is alert and oriented, talkative. She complains of some mild pain at her right lower rib cage She is cool and clammy Lung sounds diminished bases Heart tones rapid  RN has given 2 doses of lopressor BP 72/45  HR 130s  RR 12-14  O2 sat 94  Temp 98 Oral   Interventions:  500 cc NS bolus Dr Jerral Ralph at bedside, vagal maneuver HR converted to SR 80s 12 lead EKG  BP 103/54 HR 86,  after fluid stopped BP dropped to 79/41. Additional 500cc NS bolus given  Plan of Care:  Oral Lopressor RN to call if patient has symptomatic tachycardia again   Event Summary:   MD Notified: Ghimire Call Time: 0818 Arrival Time: 7017 End Time: 7939  Marcellina Millin, RN

## 2022-10-22 NOTE — Progress Notes (Signed)
PROGRESS NOTE        PATIENT DETAILS Name: [name] Age: 76 y.o. Sex: female Date of Birth: 05/02/1946 Admit Date: 10/17/2022 Admitting Physician John Giovanni, MD [id number], Donalee Citrin, NP  Brief Summary: Patient is a 76 y.o.  female with history of DM-2, HTN, HLD, aortic stenosis s/p TAVR, HFpEF, chronic lower extremity lymphedema-who was recently diagnosed with UTI at [facility] on IM Rocephin-sent from SNF for worsening confusion-she was thought to have sepsis in the setting of complicated UTI and subsequently admitted to hospitalist service.  Significant events: 11/9>> admit to [facility]-severe sepsis-complicated UTI/proctitis. 11/12>> failed voiding trial-Foley catheter reinserted 11/14>> SVT-no response to IV Lopressor x2-responded to Valsalva  Significant studies: 11/09>> CXR: No PNA 11/10>> CT abdomen/pelvis: Cystitis/bilateral hydronephrosis/proctitis without obstruction or perforation. 11/10>> CT head: No acute intracranial abnormality.  Significant microbiology data: 11/10>> blood culture: No growth 11/10>> urine culture: Klebsiella/E. coli  Procedures: None  Consults: None  Subjective: SVT this morning-but otherwise uneventful overnight.  Objective: Vitals: Blood pressure 124/62, pulse 68, temperature 98 F (36.7 C), temperature source Oral, resp. rate 18, height 5\' 4"  (1.626 m), weight 74.3 kg, SpO2 94 %.   Exam: Gen Exam:Alert awake-not in any distress HEENT:atraumatic, normocephalic Chest: B/L clear to auscultation anteriorly CVS:S1S2 regular Abdomen:soft non tender, non distended Extremities:no edema Neurology: Non focal Skin: no rash  Pertinent Labs/Radiology:    Latest Ref Rng & Units 10/19/2022    1:18 AM 10/18/2022    4:13 AM 10/17/2022   11:00 PM  CBC  WBC 4.0 - 10.5 K/uL 11.9  17.3  23.3   Hemoglobin 12.0 - 15.0 g/dL 9.2  8.8  13/08/2022   Hematocrit 36.0 - 46.0 % 29.3  26.6  35.5   Platelets 150 - 400  K/uL 165  169  244     Lab Results  Component Value Date   NA 138 10/21/2022   K 3.3 (L) 10/21/2022   CL 100 10/21/2022   CO2 32 10/21/2022     Assessment/Plan: Severe sepsis due to complicated UTI (cystitis with pyelonephritis) Acute proctitis Sepsis physiology resolved No further pathology-and no diarrhea  Did have dysuria for several days while at SNF  Urine culture positive for ESBL Klebsiella/E. coli  Unclear whether organisms in urine culture is a contamination or real infection-as she responded to cefepime/Flagyl.   ID following-we will await further recommendations.    SVT Brief episode earlier this morning Back in sinus rhythm after Valsalva maneuvers-no response to IV Lopressor Increase oral beta-blocker dosage.  Acute metabolic encephalopathy Due to severe sepsis Significantly better-encephalopathy resolved. Maintain delirium precautions.  ?Acute hypoxic respiratory failure Likely error with pulse ox-hypoxia ruled out She has no respiratory symptoms On room air.  AKI Hemodynamically mediated AKI has resolved-supportive care for now.  Acute urinary retention-s/p indwelling Foley catheter placed at SNF prior to hospitalization Apparently had a Foley catheter placed a few days prior to this hospitalization Voiding trial attempted yesterday-unsuccessful-Foley catheter reinserted Will need outpatient voiding trial.  Hypokalemia Replete/recheck.  Hyponatremia Resolved.  Normocytic anemia Chronic issue since June of this year and will likely worsen due to acute illness. No evidence of blood loss Follow CBC.  Chronic HFpEF Stable/euvolemic-chronic lymphedema at baseline. Continue Demadex  Chronic bilateral lymphedema Appears to be at baseline Continue Demadex  History of aortic stenosis-s/p TAVR 2021  HTN Briefly-hypotensive after IV Lopressor given for  SVT.  Responded to IVF. Will increase oral beta-blocker dosage in light of SVT  DM-2 (A1c>  14.0 on 5/17) CBGs stable Continue Semglee 14 units daily/SSI Follow/optimize.  Recent Labs    10/22/22 0402 10/22/22 0711 10/22/22 1112  GLUCAP 125* 113* 180*     HLD Resume statin/Zetia  GERD PPI  Peripheral neuropathy Continue Neurontin  Depression Continue Celexa  Frailty/debility/deconditioning PT/OT eval Currently at SNF  Obesity: Estimated body mass index is 28.12 kg/m as calculated from the following:   Height as of this encounter: 5\' 4"  (1.626 m).   Weight as of this encounter: 74.3 kg.    Code status:   Code Status: DNR   DVT Prophylaxis: enoxaparin (LOVENOX) injection 40 mg Start: 10/18/22 1000   Family Communication:  Son-[name]-([phone]) [phone]-updated over the phone 11/13   Disposition Plan: Status is: Inpatient Remains inpatient appropriate because: Resolving sepsis physiology-not yet stable for discharge.   Planned Discharge Destination:Skilled nursing facility in the next day or so.   Diet: Diet Order             Diet heart healthy/carb modified Room service appropriate? Yes; Fluid consistency: Thin  Diet effective now                     Antimicrobial agents: Anti-infectives (From admission, onward)    Start     Dose/Rate Route Frequency Ordered Stop   10/23/22 1000  sulfamethoxazole-trimethoprim (BACTRIM DS) 800-160 MG per tablet 1 tablet        1 tablet Oral Every 12 hours 10/22/22 1000 10/28/22 0959   10/20/22 1530  ceFEPIme (MAXIPIME) 2 g in sodium chloride 0.9 % 100 mL IVPB        2 g 200 mL/hr over 30 Minutes Intravenous Every 12 hours 10/20/22 1438 10/22/22 2359   10/19/22 2200  metroNIDAZOLE (FLAGYL) tablet 500 mg        500 mg Oral Every 12 hours 10/19/22 1738 10/27/22 2359   10/19/22 0000  ceFEPIme (MAXIPIME) 2 g in sodium chloride 0.9 % 100 mL IVPB  Status:  Discontinued        2 g 200 mL/hr over 30 Minutes Intravenous Every 24 hours 10/18/22 0431 10/20/22 1438   10/18/22 0145  metroNIDAZOLE (FLAGYL)  IVPB 500 mg  Status:  Discontinued        500 mg 100 mL/hr over 60 Minutes Intravenous Every 12 hours 10/18/22 0140 10/19/22 1738   10/18/22 0130  ceFEPIme (MAXIPIME) 2 g in sodium chloride 0.9 % 100 mL IVPB        2 g 200 mL/hr over 30 Minutes Intravenous  Once 10/18/22 0115 10/18/22 0130        MEDICATIONS: Scheduled Meds:  adenosine (ADENOCARD) IV  6 mg Intravenous Once   atorvastatin  80 mg Oral Daily   Chlorhexidine Gluconate Cloth  6 each Topical Daily   citalopram  20 mg Oral Daily   enoxaparin (LOVENOX) injection  40 mg Subcutaneous Daily   ezetimibe  10 mg Oral Daily   gabapentin  300 mg Oral TID   insulin aspart  0-9 Units Subcutaneous Q4H   insulin glargine-yfgn  14 Units Subcutaneous QHS   metoprolol tartrate  25 mg Oral BID   metroNIDAZOLE  500 mg Oral Q12H   pantoprazole  40 mg Oral Daily   [START ON 10/23/2022] sulfamethoxazole-trimethoprim  1 tablet Oral Q12H   torsemide  20 mg Oral Daily   Continuous Infusions:  ceFEPime (MAXIPIME) IV 2 g (  10/22/22 1108)   PRN Meds:.acetaminophen **OR** acetaminophen, metoprolol tartrate, mouth rinse   I have personally reviewed following labs and imaging studies  LABORATORY DATA: CBC: Recent Labs  Lab 10/17/22 2300 10/18/22 0413 10/19/22 0118  WBC 23.3* 17.3* 11.9*  NEUTROABS 18.1*  --   --   HGB 11.8* 8.8* 9.2*  HCT 35.5* 26.6* 29.3*  MCV 85.3 85.8 88.5  PLT 244 169 165     Basic Metabolic Panel: Recent Labs  Lab 10/17/22 2300 10/18/22 0413 10/19/22 0118 10/20/22 0244 10/21/22 0248  NA 134* 131* 133* 138 138  K 4.0 3.2* 3.4* 3.4* 3.3*  CL 94* 98 100 102 100  CO2 30 26 23 28  32  GLUCOSE 186* 134* 193* 109* 236*  BUN 51* 45* 27* 13 17  CREATININE 1.30* 0.89 0.53 0.47 0.72  CALCIUM 8.9 7.8* 8.0* 8.6* 8.6*  MG  --   --   --  1.5* 1.8     GFR: Estimated Creatinine Clearance: 60 mL/min (by C-G formula based on SCr of 0.72 mg/dL).  Liver Function Tests: Recent Labs  Lab 10/19/22 0118  AST 19   ALT 16  ALKPHOS 152*  BILITOT 0.4  PROT 5.2*  ALBUMIN 1.8*    No results for input(s): "LIPASE", "AMYLASE" in the last 168 hours. No results for input(s): "AMMONIA" in the last 168 hours.  Coagulation Profile: No results for input(s): "INR", "PROTIME" in the last 168 hours.  Cardiac Enzymes: No results for input(s): "CKTOTAL", "CKMB", "CKMBINDEX", "TROPONINI" in the last 168 hours.  BNP (last 3 results) No results for input(s): "PROBNP" in the last 8760 hours.  Lipid Profile: No results for input(s): "CHOL", "HDL", "LDLCALC", "TRIG", "CHOLHDL", "LDLDIRECT" in the last 72 hours.  Thyroid Function Tests: No results for input(s): "TSH", "T4TOTAL", "FREET4", "T3FREE", "THYROIDAB" in the last 72 hours.  Anemia Panel: No results for input(s): "VITAMINB12", "FOLATE", "FERRITIN", "TIBC", "IRON", "RETICCTPCT" in the last 72 hours.  Urine analysis:    Component Value Date/Time   COLORURINE AMBER (A) 10/18/2022 0004   APPEARANCEUR TURBID (A) 10/18/2022 0004   LABSPEC 1.017 10/18/2022 0004   PHURINE 5.0 10/18/2022 0004   GLUCOSEU NEGATIVE 10/18/2022 0004   HGBUR LARGE (A) 10/18/2022 0004   BILIRUBINUR NEGATIVE 10/18/2022 0004   BILIRUBINUR Neg 05/10/2019 1029   KETONESUR NEGATIVE 10/18/2022 0004   PROTEINUR >=300 (A) 10/18/2022 0004   UROBILINOGEN 0.2 05/10/2019 1029   UROBILINOGEN 0.2 12/19/2014 1425   NITRITE NEGATIVE 10/18/2022 0004   LEUKOCYTESUR MODERATE (A) 10/18/2022 0004    Sepsis Labs: Lactic Acid, Venous    Component Value Date/Time   LATICACIDVEN 1.6 10/18/2022 0140    MICROBIOLOGY: Recent Results (from the past 240 hour(s))  Blood culture (routine x 2)     Status: None (Preliminary result)   Collection Time: 10/17/22 11:00 PM   Specimen: BLOOD LEFT ARM  Result Value Ref Range Status   Specimen Description BLOOD LEFT ARM  Final   Special Requests   Final    BOTTLES DRAWN AEROBIC AND ANAEROBIC Blood Culture adequate volume   Culture   Final    NO GROWTH  4 DAYS Performed at [facility], [address] [place], [place] [address]    Report Status PENDING  Incomplete  Urine Culture     Status: Abnormal   Collection Time: 10/18/22 12:04 AM   Specimen: Urine, Catheterized  Result Value Ref Range Status   Specimen Description URINE, CATHETERIZED  Final   Special Requests NONE  Final  Culture (A)  Final    >=100,000 COLONIES/mL KLEBSIELLA PNEUMONIAE 30,000 COLONIES/mL ESCHERICHIA COLI Confirmed Extended Spectrum Beta-Lactamase Producer (ESBL).  In bloodstream infections from ESBL organisms, carbapenems are preferred over piperacillin/tazobactam. They are shown to have a lower risk of mortality. FOR KLEBSIELLA PNEUMONIAE Performed at [facility], [address] [place], [place] [address]    Report Status 10/21/2022 FINAL  Final   Organism ID, Bacteria ESCHERICHIA COLI (A)  Final   Organism ID, Bacteria KLEBSIELLA PNEUMONIAE (A)  Final      Susceptibility   Escherichia coli - MIC*    AMPICILLIN >=32 RESISTANT Resistant     CEFAZOLIN >=64 RESISTANT Resistant     CEFEPIME <=0.12 SENSITIVE Sensitive     CEFTRIAXONE 1 SENSITIVE Sensitive     CIPROFLOXACIN <=0.25 SENSITIVE Sensitive     GENTAMICIN <=1 SENSITIVE Sensitive     IMIPENEM <=0.25 SENSITIVE Sensitive     NITROFURANTOIN <=16 SENSITIVE Sensitive     TRIMETH/SULFA <=20 SENSITIVE Sensitive     AMPICILLIN/SULBACTAM >=32 RESISTANT Resistant     PIP/TAZO 8 SENSITIVE Sensitive     * 30,000 COLONIES/mL ESCHERICHIA COLI   Klebsiella pneumoniae - MIC*    AMPICILLIN >=32 RESISTANT Resistant     CEFAZOLIN >=64 RESISTANT Resistant     CEFEPIME >=32 RESISTANT Resistant     CEFTRIAXONE >=64 RESISTANT Resistant     CIPROFLOXACIN 1 RESISTANT Resistant     GENTAMICIN <=1 SENSITIVE Sensitive     IMIPENEM 2 SENSITIVE Sensitive     NITROFURANTOIN 64 INTERMEDIATE Intermediate     TRIMETH/SULFA >=320 RESISTANT Resistant     AMPICILLIN/SULBACTAM >=32 RESISTANT Resistant     PIP/TAZO  16 SENSITIVE Sensitive     * >=100,000 COLONIES/mL KLEBSIELLA PNEUMONIAE  Blood culture (routine x 2)     Status: None (Preliminary result)   Collection Time: 10/18/22  1:45 AM   Specimen: BLOOD RIGHT ARM  Result Value Ref Range Status   Specimen Description BLOOD RIGHT ARM  Final   Special Requests   Final    BOTTLES DRAWN AEROBIC AND ANAEROBIC Blood Culture adequate volume   Culture   Final    NO GROWTH 4 DAYS Performed at [facility], [address], [place], [place] [address]    Report Status PENDING  Incomplete    RADIOLOGY STUDIES/RESULTS: No results found.   LOS: 4 days   Jeoffrey Massed, MD  [facility]    To contact the attending provider between 7A-7P or the covering provider during after hours 7P-7A, please log into the web site [email or web address] and access using universal [place] password for that web site. If you do not have the password, please call the hospital operator.  10/22/2022, 11:22 AM

## 2022-10-22 NOTE — Plan of Care (Signed)
Patient was in SVT during shift change. Patient alert and oriented and only complaining of dizziness and a slight headache. MD notified via secure chat. MD ordered Metoprolol IVP and 12 lead EKG. Vitals obtained. 12-lead EKG performed. Metoprolol given.  MD came to bedside to assess patient. MD ordered another 5mg  Metoprolol IVP to be given because patient's HR was still in the 130s. Rapid response nurse was also called to come assess patient and be present in case Adenosine was needed. See RR Nurse note.

## 2022-10-22 NOTE — Significant Event (Addendum)
Rapid Response Event Note   Reason for Call :  HR 130, hypotension  Initial Focused Assessment:  Patient is alert and oriented.  She looks good and is very talkative. BP 74/47  HR 134  RR 20  O2 sat 96% on RA   Interventions:  6mg  Adenosine HR 70s  BP 122/69  ( manual 122/65)  After about patient returned to a HR 130s.  She felt very dizzy. BP 80/58 HR 130s  Cardiology at bedside to assess patient  2nd PIV started Amiodarone bolus and gtt started  Transfer to progressive care   Plan of Care:    Event Summary:   MD Notified: Ghimire at bedside Call Time: 1440 Arrival Time: 1445 End Time: 1625  , RN

## 2022-10-22 NOTE — TOC Progression Note (Signed)
Transition of Care [facility]) - Initial/Assessment Note    Patient Details  Name: [name] MRN: [id number] Date of Birth: [birth year]  Transition of Care [facility]) CM/SW Contact:    Milinda Antis, LCSWA Phone Number: 10/22/2022, 11:36 AM  Clinical Narrative:                 LCSW contacted Olivia Mackie in admissions at [facility] (SNF).  The patient is long term care and can return tomorrow if medically ready.  TOC will continue to follow.    Expected Discharge Plan: Skilled Nursing Facility Barriers to Discharge: Continued Medical Work up   Patient Goals and CMS Choice        Expected Discharge Plan and Services Expected Discharge Plan: [place] Acute Care Choice: [place] Living arrangements for the past 2 months: [place]                                      Prior Living Arrangements/Services Living arrangements for the past 2 months: [place] Lives with:: Facility Resident Patient language and need for interpreter reviewed:: No        Need for Family Participation in Patient Care: Yes (Comment) Care giver support system in place?: Yes (comment)   Criminal Activity/Legal Involvement Pertinent to Current Situation/Hospitalization: No - Comment as needed  Activities of Daily Living   ADL Screening (condition at time of admission) Patient's cognitive ability adequate to safely complete daily activities?: Yes Is the patient deaf or have difficulty hearing?: No Does the patient have difficulty seeing, even when wearing glasses/contacts?: No Does the patient have difficulty concentrating, remembering, or making decisions?: Yes Patient able to express need for assistance with ADLs?: Yes Does the patient have difficulty dressing or bathing?: Yes Independently performs ADLs?: No Communication: Independent Dressing (OT): Needs assistance Is this a change from baseline?: Pre-admission  baseline Grooming: Needs assistance Is this a change from baseline?: Pre-admission baseline Feeding: Independent Bathing: Needs assistance Is this a change from baseline?: Pre-admission baseline Toileting: Needs assistance Is this a change from baseline?: Pre-admission baseline In/Out Bed: Needs assistance Is this a change from baseline?: Pre-admission baseline Walks in Home: Needs assistance Is this a change from baseline?: Pre-admission baseline Does the patient have difficulty walking or climbing stairs?: Yes Weakness of Legs: Both Weakness of Arms/Hands: Both  Permission Sought/Granted                  Emotional Assessment       Orientation: : Oriented to Self, Oriented to Place, Oriented to  Time, Oriented to Situation Alcohol / Substance Use: Not Applicable Psych Involvement: No (comment)  Admission diagnosis:  Pyelonephritis [N12] AKI (acute kidney injury) ([place]) 123456 Complicated UTI (urinary tract infection) [N39.0] Sepsis, due to unspecified organism, unspecified whether acute organ dysfunction present [facility]) [A41.9] Patient Active Problem List   Diagnosis Date Noted   Complicated UTI (urinary tract infection) 10/18/2022   Acute proctitis 10/18/2022   Severe sepsis ([place]) 10/18/2022   Altered mental status 10/18/2022   Acute hypoxemic respiratory failure ([place]) 10/18/2022   Heart failure with preserved ejection fraction ([place]) 10/18/2022   Pressure injury of back, stage 1 05/21/2022   Femur fracture ([place]) 05/19/2022   Leucocytosis 05/19/2022   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, limited to breakdown of skin ([place]) 01/03/2022  Fall at home 07/09/2021   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   Near syncope 05/11/2020   Pressure injury of skin 05/11/2020   Orthostatic hypotension 05/11/2020   Dental caries 05/11/2020   Aortic stenosis    Diabetic neuropathy (HCC) 11/13/2018   Left knee pain  10/24/2017   Greater trochanteric bursitis of left hip 10/24/2017   Atypical chest pain 10/10/2015   AKI (acute kidney injury) (HCC) 12/19/2014   Reactive airway disease 04/09/2014   Seasonal allergic rhinitis 04/09/2014   Preventative health care 04/09/2014   Abnormality of gait 03/17/2013   Irritable bowel syndrome (IBS) 08/27/2012   Shoulder pain 09/25/2009   Type 2 diabetes mellitus (HCC) 06/18/2007   MDD (major depressive disorder) 06/18/2007   GERD 06/18/2007   HLD (hyperlipidemia) 12/27/2006   Essential hypertension 09/19/2006   Venous (peripheral) insufficiency 09/19/2006   PCP:  Caesar Bookman, NP Pharmacy:   [facility] [address] [place] [address] Phone: [phone] Fax: [phone]  [facility] [place] - [address] [place]  [address] Phone: [phone] Fax: [phone]  [facility] [address] [place] [address] Phone: [phone] Fax: [phone]     Social Determinants of Health (SDOH) Interventions    Readmission Risk Interventions     No data to display

## 2022-10-22 NOTE — Plan of Care (Signed)

## 2022-10-23 ENCOUNTER — Inpatient Hospital Stay (HOSPITAL_COMMUNITY): Payer: Medicare Other

## 2022-10-23 DIAGNOSIS — N179 Acute kidney failure, unspecified: Secondary | ICD-10-CM

## 2022-10-23 DIAGNOSIS — I471 Supraventricular tachycardia, unspecified: Secondary | ICD-10-CM

## 2022-10-23 LAB — CULTURE, BLOOD (ROUTINE X 2)
Culture: NO GROWTH
Culture: NO GROWTH
Special Requests: ADEQUATE
Special Requests: ADEQUATE

## 2022-10-23 LAB — GLUCOSE, CAPILLARY
Glucose-Capillary: 195 mg/dL — ABNORMAL HIGH (ref 70–99)
Glucose-Capillary: 111 mg/dL — ABNORMAL HIGH (ref 70–99)
Glucose-Capillary: 140 mg/dL — ABNORMAL HIGH (ref 70–99)
Glucose-Capillary: 136 mg/dL — ABNORMAL HIGH (ref 70–99)
Glucose-Capillary: 139 mg/dL — ABNORMAL HIGH (ref 70–99)
Glucose-Capillary: 199 mg/dL — ABNORMAL HIGH (ref 70–99)

## 2022-10-23 LAB — BASIC METABOLIC PANEL
Anion gap: 7 (ref 5–15)
BUN: 15 mg/dL (ref 8–23)
CO2: 27 mmol/L (ref 22–32)
Calcium: 8.4 mg/dL — ABNORMAL LOW (ref 8.9–10.3)
Chloride: 103 mmol/L (ref 98–111)
Creatinine, Ser: 0.61 mg/dL (ref 0.44–1.00)
GFR, Estimated: >60 mL/min (ref >60)
Glucose, Bld: 125 mg/dL — ABNORMAL HIGH (ref 70–99)
Potassium: 3.6 mmol/L (ref 3.5–5.1)
Sodium: 137 mmol/L (ref 135–145)

## 2022-10-23 LAB — MAGNESIUM: Magnesium: 2.1 mg/dL (ref 1.7–2.4)

## 2022-10-23 LAB — TSH: TSH: 4.448 u[IU]/mL (ref 0.350–4.500)

## 2022-10-23 NOTE — Progress Notes (Signed)
MD notified during morning rounds that amio gtt stopped overnight d/t hypotension and HR in 50-60s.  EKG obtained in the morning.  Cardiology MD notified that amio gtt stopped overnight.  Pt in NSR and BP WDL.  Will continue to monitor.

## 2022-10-23 NOTE — Progress Notes (Addendum)
Rounding Note    Patient Name: [name] Date of Encounter: 10/23/2022  [facility] Cardiologist: Chilton Si, MD   Subjective   Feeling much better.  She notes episodes of palpitations at home that she never focused on.  She did feel the episodes of SVT overnight.  Inpatient Medications    Scheduled Meds:  adenosine (ADENOCARD) IV  6 mg Intravenous Once   atorvastatin  80 mg Oral Daily   Chlorhexidine Gluconate Cloth  6 each Topical Daily   citalopram  20 mg Oral Daily   enoxaparin (LOVENOX) injection  40 mg Subcutaneous Daily   ezetimibe  10 mg Oral Daily   gabapentin  300 mg Oral TID   insulin aspart  0-9 Units Subcutaneous Q4H   insulin glargine-yfgn  14 Units Subcutaneous QHS   metoprolol tartrate  25 mg Oral BID   metroNIDAZOLE  500 mg Oral Q12H   pantoprazole  40 mg Oral Daily   sulfamethoxazole-trimethoprim  1 tablet Oral Q12H   torsemide  20 mg Oral Daily   Continuous Infusions:  amiodarone 30 mg/hr (10/23/22 0507)   PRN Meds: acetaminophen **OR** acetaminophen, metoprolol tartrate, mouth rinse   Vital Signs    Vitals:   10/23/22 0400 10/23/22 0430 10/23/22 0500 10/23/22 0800  BP: (!) 131/59 (!) 113/49 (!) 173/64 137/73  Pulse: 63 60 71 67  Resp: 18 17 18 18   Temp:    (!) 97.5 F (36.4 C)  TempSrc:    Oral  SpO2: 95% 92% 96% 96%  Weight:      Height:        Intake/Output Summary (Last 24 hours) at 10/23/2022 0937 Last data filed at 10/23/2022 0530 Gross per 24 hour  Intake 1128.09 ml  Output 2150 ml  Net -1021.91 ml      10/18/2022    9:49 PM 10/18/2022    3:00 PM 10/18/2022    4:00 AM  Last 3 Weights  Weight (lbs) 163 lb 12.8 oz 163 lb 12.8 oz 185 lb  Weight (kg) 74.3 kg 74.3 kg 83.915 kg      Telemetry    SVT converted to sinus rhythm around 22:00 - Personally Reviewed  ECG    10/23/2022: Sinus rhythm.  Rate 69 bpm.  LAFB.  LVH. - Personally Reviewed  Physical Exam   VS:  BP 137/73 (BP Location:  Right Arm)   Pulse 67   Temp (!) 97.5 F (36.4 C) (Oral)   Resp 18   Ht 5\' 4"  (1.626 m)   Wt 74.3 kg   SpO2 96%   BMI 28.12 kg/m  , BMI Body mass index is 28.12 kg/m. GENERAL:  Well appearing HEENT: Pupils equal round and reactive, fundi not visualized, oral mucosa unremarkable NECK:  No jugular venous distention, waveform within normal limits, carotid upstroke brisk and symmetric, no bruits, no thyromegaly LUNGS:  Clear to auscultation bilaterally HEART:  RRR.  PMI not displaced or sustained,S1 and S2 within normal limits, no S3, no S4, no clicks, no rubs, no murmurs ABD:  Flat, positive bowel sounds normal in frequency in pitch, no bruits, no rebound, no guarding, no midline pulsatile mass, no hepatomegaly, no splenomegaly EXT:  2 plus pulses throughout, no edema, no cyanosis no clubbing SKIN:  No rashes no nodules NEURO:  Cranial nerves II through XII grossly intact, motor grossly intact throughout PSYCH:  Cognitively intact, oriented to person place and time   Labs    High Sensitivity Troponin:  No results for  input(s): "TROPONINIHS" in the last 720 hours.   Chemistry Recent Labs  Lab 10/19/22 0118 10/20/22 0244 10/21/22 0248 10/22/22 1431 10/23/22 0700  NA 133*   < > 138 136 137  K 3.4*   < > 3.3* 4.2 3.6  CL 100   < > 100 100 103  CO2 23   < > 32 30 27  GLUCOSE 193*   < > 236* 209* 125*  BUN 27*   < > 17 16 15   CREATININE 0.53   < > 0.72 0.81 0.61  CALCIUM 8.0*   < > 8.6* 8.3* 8.4*  MG  --    < > 1.8 1.6* 2.1  PROT 5.2*  --   --   --   --   ALBUMIN 1.8*  --   --   --   --   AST 19  --   --   --   --   ALT 16  --   --   --   --   ALKPHOS 152*  --   --   --   --   BILITOT 0.4  --   --   --   --   GFRNONAA >60   < > >60 >60 >60  ANIONGAP 10   < > 6 6 7    < > = values in this interval not displayed.    Lipids No results for input(s): "CHOL", "TRIG", "HDL", "LABVLDL", "LDLCALC", "CHOLHDL" in the last 168 hours.  Hematology Recent Labs  Lab 10/18/22 0413  10/19/22 0118 10/22/22 1431  WBC 17.3* 11.9* 8.7  RBC 3.10* 3.31* 4.16  HGB 8.8* 9.2* 11.5*  HCT 26.6* 29.3* 36.3  MCV 85.8 88.5 87.3  MCH 28.4 27.8 27.6  MCHC 33.1 31.4 31.7  RDW 15.0 14.9 14.9  PLT 169 165 237   Thyroid  Recent Labs  Lab 10/22/22 1426 10/23/22 0700  TSH  --  4.448  FREET4 1.46*  --     BNPNo results for input(s): "BNP", "PROBNP" in the last 168 hours.  DDimer No results for input(s): "DDIMER" in the last 168 hours.   Radiology    No results found.  Cardiac Studies   Echo 05/11/2021: IMPRESSIONS    1. The aortic valve has been repaired/replaced. Aortic valve  regurgitation is not visualized. No aortic stenosis is present. There is a  23 mm Sapien prosthetic (TAVR) valve present in the aortic position.  Procedure Date: 05/17/20. Aortic valve mean  gradient measures 18.8 mmHg. Aortic valve Vmax measures 3.04 m/s. Aortic  valve acceleration time measures 70 msec.   2. Left ventricular ejection fraction, by estimation, is 60 to 65%. The  left ventricle has normal function. The left ventricle has no regional  wall motion abnormalities. There is moderate asymmetric left ventricular  hypertrophy of the basal-septal  segment. Left ventricular diastolic parameters are consistent with Grade  II diastolic dysfunction (pseudonormalization).   3. Right ventricular systolic function is normal. The right ventricular  size is normal. Tricuspid regurgitation signal is inadequate for assessing  PA pressure.   4. Left atrial size was mildly dilated.   5. The mitral valve is normal in structure. Trivial mitral valve  regurgitation. No evidence of mitral stenosis.   6. The inferior vena cava is normal in size with greater than 50%  respiratory variability, suggesting right atrial pressure of 3 mmHg.   Patient Profile     76 y.o. female with aortic stenosis status post TAVR, hypertension, hyperlipidemia, diabetes,  and lymphedema admitted with sepsis from a urinary  source.  Cardiology consulted for SVT.  Assessment & Plan    #SVT: Stabilized on amiodarone.  She had recurrent episodes requiring adenosine.  She was hypotensive and amiodarone was stopped.  Blood pressures have recovered.  Starting metoprolol today.  If she has recurrent SVT on metoprolol we will need to restart amiodarone.  She does report that she had some episodes of palpitations at home.  Recommend ambulatory monitor at discharge and outpatient follow-up.  #Hypertension: She was hypotensive during her episode of SVT.  Given that her blood pressure is improved she should be able to tolerate metoprolol today.  #Aortic stenosis status post TAVR: Last echo in 2022 showed a stable valve with a mean gradient 19 mmHg.  Repeat echo pending.  #Hyperlipidemia: Continue atorvastatin and Zetia  #Lymphedema: Stable.  Continue torsemide.       For questions or updates, please contact [facility] Please consult [email or web address] for contact info under        Signed, Chilton Si, MD  10/23/2022, 9:37 AM

## 2022-10-23 NOTE — Progress Notes (Signed)
Echo attempted. Patient uncooperative and refused echo.

## 2022-10-23 NOTE — Progress Notes (Signed)
PROGRESS NOTE        PATIENT DETAILS Name: [name] Age: 76 y.o. Sex: female Date of Birth: 08/12/1946 Admit Date: 10/17/2022 Admitting Physician Shela Leff, MD [id number], [name], NP  Brief Summary: Patient is a 76 y.o.  female with history of DM-2, HTN, HLD, aortic stenosis s/p TAVR, HFpEF, chronic lower extremity lymphedema-who was recently diagnosed with UTI at [facility] on IM Rocephin-sent from SNF for worsening confusion-she was thought to have sepsis in the setting of complicated UTI and subsequently admitted to hospitalist service.  Significant events: 11/9>> admit to [facility]-severe sepsis-complicated UTI/proctitis. 11/12>> failed voiding trial-Foley catheter reinserted 11/14>> SVT-no response to IV Lopressor x2-responded to Valsalva, had SVT again-and briefly responded to adenosine.  Since back in SVT-started on amiodarone infusion-transfer to progressive care.  Significant studies: 11/09>> CXR: No PNA 11/10>> CT abdomen/pelvis: Cystitis/bilateral hydronephrosis/proctitis without obstruction or perforation. 11/10>> CT head: No acute intracranial abnormality.  Significant microbiology data: 11/10>> blood culture: No growth 11/10>> urine culture: Klebsiella/E. coli  Procedures: None  Consults: Cardiology Infectious disease  Subjective: Converted to sinus rhythm last evening.  Objective: Vitals: Blood pressure 110/84, pulse 68, temperature 97.8 F (36.6 C), temperature source Oral, resp. rate 18, height 5\' 4"  (1.626 m), weight 74.3 kg, SpO2 95 %.   Exam: Gen Exam:Alert awake-not in any distress HEENT:atraumatic, normocephalic Chest: B/L clear to auscultation anteriorly CVS:S1S2 regular Abdomen:soft non tender, non distended Extremities:no edema Neurology: Non focal Skin: no rash  Pertinent Labs/Radiology:    Latest Ref Rng & Units 10/22/2022    2:31 PM 10/19/2022    1:18 AM 10/18/2022    4:13 AM  CBC  WBC  4.0 - 10.5 K/uL 8.7  11.9  17.3   Hemoglobin 12.0 - 15.0 g/dL 11.5  9.2  8.8   Hematocrit 36.0 - 46.0 % 36.3  29.3  26.6   Platelets 150 - 400 K/uL 237  165  169     Lab Results  Component Value Date   NA 137 10/23/2022   K 3.6 10/23/2022   CL 103 10/23/2022   CO2 27 10/23/2022     Assessment/Plan: Severe sepsis due to complicated UTI (cystitis with pyelonephritis) Acute proctitis Sepsis physiology resolved No further pathology-and no diarrhea  Did have dysuria for several days while at SNF  Urine culture positive for ESBL Klebsiella/E. coli  ID consulted-felt to have more of proctitis rather than UTI.   Antibiotics narrowed by ID to Bactrim/Flagyl.    SVT with hypotension Occurred on 11/14-initially responded to Valsalva, and then to adenosine.  However since it kept reoccurring-transferred to PCU and started on IV amiodarone infusion. Cardiology team now following Beta-blocker being restarted today, transitioning to oral amiodarone contemplated for tomorrow. BP now stabilized since restoration of sinus rhythm.  Acute metabolic encephalopathy Due to severe sepsis Significantly better-encephalopathy resolved. Maintain delirium precautions.  ?Acute hypoxic respiratory failure Likely error with pulse ox-hypoxia ruled out She has no respiratory symptoms On room air.  AKI Hemodynamically mediated AKI has resolved-supportive care for now.  Acute urinary retention-s/p indwelling Foley catheter placed at SNF prior to hospitalization Apparently had a Foley catheter placed a few days prior to this hospitalization Voiding trial attempted yesterday-unsuccessful-Foley catheter reinserted Will need outpatient voiding trial.  Hypokalemia Repleted  Hyponatremia Resolved.  Normocytic anemia Chronic issue since June of this year and will likely worsen due to acute  illness. No evidence of blood loss Follow CBC.  Chronic HFpEF Stable/euvolemic-chronic lymphedema at  baseline. Continue Demadex  Chronic bilateral lymphedema Appears to be at baseline Continue Demadex  History of aortic stenosis-s/p TAVR 2021  HTN Hypotensive with SVT on 11/14 BP now stable since restoration of sinus rhythm.  DM-2 (A1c> 14.0 on 5/17) CBGs stable Continue Semglee 14 units daily/SSI Follow/optimize.  Recent Labs    10/23/22 0352 10/23/22 0808 10/23/22 1105  GLUCAP 195* 111* 140*     HLD Resume statin/Zetia  GERD PPI  Peripheral neuropathy Continue Neurontin  Depression Continue Celexa  Frailty/debility/deconditioning PT/OT eval Currently at SNF  Obesity: Estimated body mass index is 28.12 kg/m as calculated from the following:   Height as of this encounter: 5\' 4"  (1.626 m).   Weight as of this encounter: 74.3 kg.    Code status:   Code Status: DNR   DVT Prophylaxis: enoxaparin (LOVENOX) injection 40 mg Start: 10/18/22 1000   Family Communication:  Son-Harold Eugene-([phone] -[phone]-updated over the phone 11/15   Disposition Plan: Status is: Inpatient Remains inpatient appropriate because: Resolving sepsis physiology-not yet stable for discharge.   Planned Discharge Destination:Skilled nursing facility in the next day or so.   Diet: Diet Order             Diet heart healthy/carb modified Room service appropriate? Yes; Fluid consistency: Thin  Diet effective now                     Antimicrobial agents: Anti-infectives (From admission, onward)    Start     Dose/Rate Route Frequency Ordered Stop   10/23/22 1000  sulfamethoxazole-trimethoprim (BACTRIM DS) 800-160 MG per tablet 1 tablet        1 tablet Oral Every 12 hours 10/22/22 1000 10/28/22 0959   10/20/22 1530  ceFEPIme (MAXIPIME) 2 g in sodium chloride 0.9 % 100 mL IVPB  Status:  Discontinued        2 g 200 mL/hr over 30 Minutes Intravenous Every 12 hours 10/20/22 1438 10/22/22 1529   10/19/22 2200  metroNIDAZOLE (FLAGYL) tablet 500 mg        500 mg Oral Every  12 hours 10/19/22 1738 10/27/22 2359   10/19/22 0000  ceFEPIme (MAXIPIME) 2 g in sodium chloride 0.9 % 100 mL IVPB  Status:  Discontinued        2 g 200 mL/hr over 30 Minutes Intravenous Every 24 hours 10/18/22 0431 10/20/22 1438   10/18/22 0145  metroNIDAZOLE (FLAGYL) IVPB 500 mg  Status:  Discontinued        500 mg 100 mL/hr over 60 Minutes Intravenous Every 12 hours 10/18/22 0140 10/19/22 1738   10/18/22 0130  ceFEPIme (MAXIPIME) 2 g in sodium chloride 0.9 % 100 mL IVPB        2 g 200 mL/hr over 30 Minutes Intravenous  Once 10/18/22 0115 10/18/22 0130        MEDICATIONS: Scheduled Meds:  adenosine (ADENOCARD) IV  6 mg Intravenous Once   atorvastatin  80 mg Oral Daily   Chlorhexidine Gluconate Cloth  6 each Topical Daily   citalopram  20 mg Oral Daily   enoxaparin (LOVENOX) injection  40 mg Subcutaneous Daily   ezetimibe  10 mg Oral Daily   gabapentin  300 mg Oral TID   insulin aspart  0-9 Units Subcutaneous Q4H   insulin glargine-yfgn  14 Units Subcutaneous QHS   metoprolol tartrate  25 mg Oral BID   metroNIDAZOLE  500  mg Oral Q12H   pantoprazole  40 mg Oral Daily   sulfamethoxazole-trimethoprim  1 tablet Oral Q12H   torsemide  20 mg Oral Daily   Continuous Infusions:  amiodarone Stopped (10/23/22 0700)   PRN Meds:.acetaminophen **OR** acetaminophen, metoprolol tartrate, mouth rinse   I have personally reviewed following labs and imaging studies  LABORATORY DATA: CBC: Recent Labs  Lab 10/17/22 2300 10/18/22 0413 10/19/22 0118 10/22/22 1431  WBC 23.3* 17.3* 11.9* 8.7  NEUTROABS 18.1*  --   --   --   HGB 11.8* 8.8* 9.2* 11.5*  HCT 35.5* 26.6* 29.3* 36.3  MCV 85.3 85.8 88.5 87.3  PLT 244 169 165 237     Basic Metabolic Panel: Recent Labs  Lab 10/19/22 0118 10/20/22 0244 10/21/22 0248 10/22/22 1431 10/23/22 0700  NA 133* 138 138 136 137  K 3.4* 3.4* 3.3* 4.2 3.6  CL 100 102 100 100 103  CO2 23 28 32 30 27  GLUCOSE 193* 109* 236* 209* 125*  BUN 27*  13 17 16 15   CREATININE 0.53 0.47 0.72 0.81 0.61  CALCIUM 8.0* 8.6* 8.6* 8.3* 8.4*  MG  --  1.5* 1.8 1.6* 2.1     GFR: Estimated Creatinine Clearance: 60 mL/min (by C-G formula based on SCr of 0.61 mg/dL).  Liver Function Tests: Recent Labs  Lab 10/19/22 0118  AST 19  ALT 16  ALKPHOS 152*  BILITOT 0.4  PROT 5.2*  ALBUMIN 1.8*    No results for input(s): "LIPASE", "AMYLASE" in the last 168 hours. No results for input(s): "AMMONIA" in the last 168 hours.  Coagulation Profile: No results for input(s): "INR", "PROTIME" in the last 168 hours.  Cardiac Enzymes: No results for input(s): "CKTOTAL", "CKMB", "CKMBINDEX", "TROPONINI" in the last 168 hours.  BNP (last 3 results) No results for input(s): "PROBNP" in the last 8760 hours.  Lipid Profile: No results for input(s): "CHOL", "HDL", "LDLCALC", "TRIG", "CHOLHDL", "LDLDIRECT" in the last 72 hours.  Thyroid Function Tests: Recent Labs    10/22/22 1426 10/23/22 0700  TSH  --  4.448  FREET4 1.46*  --     Anemia Panel: No results for input(s): "VITAMINB12", "FOLATE", "FERRITIN", "TIBC", "IRON", "RETICCTPCT" in the last 72 hours.  Urine analysis:    Component Value Date/Time   COLORURINE AMBER (A) 10/18/2022 0004   APPEARANCEUR TURBID (A) 10/18/2022 0004   LABSPEC 1.017 10/18/2022 0004   PHURINE 5.0 10/18/2022 0004   GLUCOSEU NEGATIVE 10/18/2022 0004   HGBUR LARGE (A) 10/18/2022 0004   BILIRUBINUR NEGATIVE 10/18/2022 0004   BILIRUBINUR Neg 05/10/2019 1029   KETONESUR NEGATIVE 10/18/2022 0004   PROTEINUR >=300 (A) 10/18/2022 0004   UROBILINOGEN 0.2 05/10/2019 1029   UROBILINOGEN 0.2 12/19/2014 1425   NITRITE NEGATIVE 10/18/2022 0004   LEUKOCYTESUR MODERATE (A) 10/18/2022 0004    Sepsis Labs: Lactic Acid, Venous    Component Value Date/Time   LATICACIDVEN 1.6 10/18/2022 0140    MICROBIOLOGY: Recent Results (from the past 240 hour(s))  Blood culture (routine x 2)     Status: None (Preliminary result)    Collection Time: 10/17/22 11:00 PM   Specimen: BLOOD LEFT ARM  Result Value Ref Range Status   Specimen Description BLOOD LEFT ARM  Final   Special Requests   Final    BOTTLES DRAWN AEROBIC AND ANAEROBIC Blood Culture adequate volume   Culture   Final    NO GROWTH 4 DAYS Performed at [facility], [place] [address] [place], [place] [address]  Report Status PENDING  Incomplete  Urine Culture     Status: Abnormal   Collection Time: 10/18/22 12:04 AM   Specimen: Urine, Catheterized  Result Value Ref Range Status   Specimen Description URINE, CATHETERIZED  Final   Special Requests NONE  Final   Culture (A)  Final    >=100,000 COLONIES/mL KLEBSIELLA PNEUMONIAE 30,000 COLONIES/mL ESCHERICHIA COLI Confirmed Extended Spectrum Beta-Lactamase Producer (ESBL).  In bloodstream infections from ESBL organisms, carbapenems are preferred over piperacillin/tazobactam. They are shown to have a lower risk of mortality. FOR KLEBSIELLA PNEUMONIAE Performed at [facility], [address] [place], [place] [address]    Report Status 10/21/2022 FINAL  Final   Organism ID, Bacteria ESCHERICHIA COLI (A)  Final   Organism ID, Bacteria KLEBSIELLA PNEUMONIAE (A)  Final      Susceptibility   Escherichia coli - MIC*    AMPICILLIN >=32 RESISTANT Resistant     CEFAZOLIN >=64 RESISTANT Resistant     CEFEPIME <=0.12 SENSITIVE Sensitive     CEFTRIAXONE 1 SENSITIVE Sensitive     CIPROFLOXACIN <=0.25 SENSITIVE Sensitive     GENTAMICIN <=1 SENSITIVE Sensitive     IMIPENEM <=0.25 SENSITIVE Sensitive     NITROFURANTOIN <=16 SENSITIVE Sensitive     TRIMETH/SULFA <=20 SENSITIVE Sensitive     AMPICILLIN/SULBACTAM >=32 RESISTANT Resistant     PIP/TAZO 8 SENSITIVE Sensitive     * 30,000 COLONIES/mL ESCHERICHIA COLI   Klebsiella pneumoniae - MIC*    AMPICILLIN >=32 RESISTANT Resistant     CEFAZOLIN >=64 RESISTANT Resistant     CEFEPIME >=32 RESISTANT Resistant     CEFTRIAXONE >=64 RESISTANT Resistant      CIPROFLOXACIN 1 RESISTANT Resistant     GENTAMICIN <=1 SENSITIVE Sensitive     IMIPENEM 2 SENSITIVE Sensitive     NITROFURANTOIN 64 INTERMEDIATE Intermediate     TRIMETH/SULFA >=320 RESISTANT Resistant     AMPICILLIN/SULBACTAM >=32 RESISTANT Resistant     PIP/TAZO 16 SENSITIVE Sensitive     * >=100,000 COLONIES/mL KLEBSIELLA PNEUMONIAE  Blood culture (routine x 2)     Status: None (Preliminary result)   Collection Time: 10/18/22  1:45 AM   Specimen: BLOOD RIGHT ARM  Result Value Ref Range Status   Specimen Description BLOOD RIGHT ARM  Final   Special Requests   Final    BOTTLES DRAWN AEROBIC AND ANAEROBIC Blood Culture adequate volume   Culture   Final    NO GROWTH 4 DAYS Performed at [facility], [address] [place], [place] [address]    Report Status PENDING  Incomplete    RADIOLOGY STUDIES/RESULTS: No results found.   LOS: 5 days   Jeoffrey Massed, MD  [facility]    To contact the attending provider between 7A-7P or the covering provider during after hours 7P-7A, please log into the web site [email or web address] and access using universal [place] password for that web site. If you do not have the password, please call the hospital operator.  10/23/2022, 11:32 AM

## 2022-10-24 ENCOUNTER — Inpatient Hospital Stay (HOSPITAL_BASED_OUTPATIENT_CLINIC_OR_DEPARTMENT_OTHER)
Admit: 2022-10-24 | Discharge: 2022-10-24 | Disposition: A | Discharge status: Home / Self Care | Payer: Medicare Other | Attending: Student | Admitting: Student

## 2022-10-24 DIAGNOSIS — I471 Supraventricular tachycardia, unspecified: Secondary | ICD-10-CM | POA: Diagnosis not present

## 2022-10-24 LAB — GLUCOSE, CAPILLARY
Glucose-Capillary: 122 mg/dL — ABNORMAL HIGH (ref 70–99)
Glucose-Capillary: 110 mg/dL — ABNORMAL HIGH (ref 70–99)
Glucose-Capillary: 203 mg/dL — ABNORMAL HIGH (ref 70–99)

## 2022-10-24 LAB — BASIC METABOLIC PANEL
Anion gap: 5 (ref 5–15)
BUN: 14 mg/dL (ref 8–23)
CO2: 29 mmol/L (ref 22–32)
Calcium: 8.4 mg/dL — ABNORMAL LOW (ref 8.9–10.3)
Chloride: 98 mmol/L (ref 98–111)
Creatinine, Ser: 0.82 mg/dL (ref 0.44–1.00)
GFR, Estimated: >60 mL/min (ref >60)
Glucose, Bld: 144 mg/dL — ABNORMAL HIGH (ref 70–99)
Potassium: 3.3 mmol/L — ABNORMAL LOW (ref 3.5–5.1)
Sodium: 132 mmol/L — ABNORMAL LOW (ref 135–145)

## 2022-10-24 LAB — MAGNESIUM: Magnesium: 1.9 mg/dL (ref 1.7–2.4)

## 2022-10-24 MED ORDER — SULFAMETHOXAZOLE-TRIMETHOPRIM 800-160 MG PO TABS: 1.0000 | ORAL_TABLET | Freq: Two times a day (BID) | ORAL | 0 refills | Status: AC

## 2022-10-24 MED ORDER — POTASSIUM CHLORIDE CRYS ER 20 MEQ PO TBCR: 40.0000 meq | EXTENDED_RELEASE_TABLET | Freq: Once | ORAL | Status: DC

## 2022-10-24 MED ORDER — POTASSIUM CHLORIDE CRYS ER 20 MEQ PO TBCR
40.0000 meq | EXTENDED_RELEASE_TABLET | ORAL | Status: AC
  Administered 2022-10-24 (×2): 40 meq via ORAL
  Filled 2022-10-24 (×2): qty 2

## 2022-10-24 MED ORDER — MAGNESIUM SULFATE IN D5W 1-5 GM/100ML-% IV SOLN
1.0000 g | Freq: Once | INTRAVENOUS | Status: AC
  Administered 2022-10-24: 1 g via INTRAVENOUS
  Filled 2022-10-24: qty 100

## 2022-10-24 MED ORDER — METRONIDAZOLE 500 MG PO TABS: 500.0000 mg | ORAL_TABLET | Freq: Two times a day (BID) | ORAL | 0 refills | Status: AC

## 2022-10-24 MED ORDER — INSULIN ASPART 100 UNIT/ML IJ SOLN: INTRAMUSCULAR | 11 refills | Status: AC

## 2022-10-24 NOTE — Care Management Important Message (Signed)
Important Message  Patient Details  Name: [name] MRN: [id number] Date of Birth: 1946-08-04   Medicare Important Message Given:  Yes     Sherilyn Banker 10/24/2022, 10:46 AM

## 2022-10-24 NOTE — TOC Transition Note (Signed)
Transition of Care [facility]) - CM/SW Discharge Note   Patient Details  Name: [name] MRN: [id number] Date of Birth: [birth year]  [facility] CM/SW Contact:  Eduard Roux, LCSW Phone Number: 10/24/2022, 12:28 PM   Clinical Narrative:     Patient will Discharge to: [facility] Discharge Date: 10/24/2022 Family Notified: son Transport By: Sharin Mons Please call patient's son,Harold once [facility] arrives # [phone]  Per MD patient is ready for discharge. RN, patient, and facility notified of discharge. Discharge Summary sent to facility. RN given number for [phone]. Ambulance transport requested for patient.   Clinical Social Worker signing off.  Antony Blackbird, MSW, LCSW Clinical Social Worker     Final next level of care: Skilled Nursing Facility Barriers to Discharge: Barriers Resolved   Patient Goals and CMS Choice        Discharge Placement              Patient chooses bed at: [facility], [place] Patient to be transferred to facility by: [facility] Name of family member notified: son Patient and family notified of of transfer: 10/24/22  Discharge Plan and Services     Post Acute Care Choice: Skilled Nursing Facility                               Social Determinants of Health (SDOH) Interventions     Readmission Risk Interventions     No data to display

## 2022-10-24 NOTE — Progress Notes (Signed)
Report called to Morrie Sheldon at [facility] all questions answered. PIV removed. Assessment and VS documented. Pt is stable. Discharge packet placed in chart for [facility] to transport. Awaiting [facility] arrival.

## 2022-10-24 NOTE — Progress Notes (Signed)
   Ordered live 2 week Zio to be placed prior to discharge for further evaluation of SVT. Will update RN to hold discharge until this is placed. Will also arrange follow-up. Please see Dr. Leonides Sake rounding note from today for any additional information.  Corrin Parker, PA-C 10/24/2022 9:56 AM

## 2022-10-24 NOTE — Discharge Summary (Signed)
PATIENT DETAILS Name: [name] Age: 76 y.o. Sex: female Date of Birth: 07/21/1946 MRN: [id number]. Admitting Physician: Shela Leff, MD [id number], [name], NP  Admit Date: 10/17/2022 Discharge date: 10/24/2022  Recommendations for Outpatient Follow-up:  Follow up with PCP in 1-2 weeks Please obtain CMP/CBC in one week Please ensure follow-up with cardiology Please ensure outpatient follow-up with urology.  Admitted From:  SNF  Disposition: Skilled nursing facility   Discharge Condition: fair  CODE STATUS:   Code Status: DNR   Diet recommendation:  Diet Order             Diet - low sodium heart healthy           Diet general           Diet heart healthy/carb modified Room service appropriate? Yes; Fluid consistency: Thin  Diet effective now                    Brief Summary: Patient is a 76 y.o.  female with history of DM-2, HTN, HLD, aortic stenosis s/p TAVR, HFpEF, chronic lower extremity lymphedema-who was recently diagnosed with UTI at [facility] on IM Rocephin-sent from SNF for worsening confusion-she was thought to have sepsis in the setting of complicated UTI and subsequently admitted to hospitalist service.   Significant events: 11/9>> admit to [facility]-severe sepsis-complicated UTI/proctitis. 11/12>> failed voiding trial-Foley catheter reinserted 11/14>> SVT-no response to IV Lopressor x2-responded to Valsalva, had SVT again-and briefly responded to adenosine.  Since back in SVT-started on amiodarone infusion-transfer to progressive care.   Significant studies: 11/09>> CXR: No PNA 11/10>> CT abdomen/pelvis: Cystitis/bilateral hydronephrosis/proctitis without obstruction or perforation. 11/10>> CT head: No acute intracranial abnormality.   Significant microbiology data: 11/10>> blood culture: No growth 11/10>> urine culture: Klebsiella/E. coli   Procedures: None   Consults: Cardiology Infectious disease  Brief Hospital  Course: Severe sepsis due to complicated UTI (cystitis with pyelonephritis) Acute proctitis Sepsis physiology resolved No further pathology-and no diarrhea  Did have dysuria for several days while at SNF  Urine culture positive for ESBL Klebsiella/E. coli  ID consulted-felt to have more of proctitis rather than UTI.   Initially on Cefepime/Flagyl, antibiotics narrowed by ID to Bactrim/Flagyl-end date 11/18     SVT with hypotension Occurred on 11/14-initially responded to Valsalva, and then to adenosine.  However since it kept reoccurring-transferred to PCU and started on IV amiodarone infusion. Has since converted to sins rhythm, amio gtt discontinued the night before-and maintaining sinus rhythm for more than 24 hours.   Blood pressure soft-Per cardiology-patient will not tolerate oral metoprolol.  Plans are to discharge back to SNF-cardiology will arrange for follow-up in 14-day monitor..   Acute metabolic encephalopathy Due to severe sepsis Significantly better-encephalopathy resolved.   ?Acute hypoxic respiratory failure Likely error with pulse ox-hypoxia ruled out She has no respiratory symptoms On room air.   AKI Hemodynamically mediated AKI has resolved-supportive care for now.   Acute urinary retention-s/p indwelling Foley catheter placed at SNF prior to hospitalization Apparently had a Foley catheter placed a few days prior to this hospitalization Voiding trial attempted yesterday-unsuccessful-Foley catheter reinserted Will need outpatient voiding trial and urology follow-up.   Hypokalemia Repleted   Hyponatremia Resolved.   Normocytic anemia Chronic issue since June of this year and will likely worsen due to acute illness. No evidence of blood loss Follow CBC.   Chronic HFpEF Stable/euvolemic-chronic lymphedema at baseline. Continue Demadex   Chronic bilateral lymphedema Appears to be at baseline Continue Demadex  History of aortic stenosis-s/p TAVR  2021   HTN Hypotensive with SVT on 11/14 BP now stable since restoration of sinus rhythm.   DM-2 (A1c> 14.0 on 5/17) CBGs stable Continue Semglee 14 units daily/SSI Follow/optimize.   HLD Resume statin/Zetia   GERD PPI   Peripheral neuropathy Continue Neurontin   Depression Continue Celexa   Frailty/debility/deconditioning PT/OT eval Currently at SNF   BMI: Estimated body mass index is 28.12 kg/m as calculated from the following:   Height as of this encounter: 5\' 4"  (1.626 m).   Weight as of this encounter: 74.3 kg.   RN pressure injury documentation: Pressure Injury 05/19/22 Sacrum Stage 2 -  Partial thickness loss of dermis presenting as a shallow open injury with a red, pink wound bed without slough. (Active)  05/19/22 1600  Location: Sacrum  Location Orientation:   Staging: Stage 2 -  Partial thickness loss of dermis presenting as a shallow open injury with a red, pink wound bed without slough.  Wound Description (Comments):   Present on Admission: Yes  Dressing Type Foam - Lift dressing to assess site every shift 10/23/22 2013     Pressure Injury 10/18/22 Heel Left Stage 3 -  Full thickness tissue loss. Subcutaneous fat may be visible but bone, tendon or muscle are NOT exposed. (Active)  10/18/22 1500  Location: Heel  Location Orientation: Left  Staging: Stage 3 -  Full thickness tissue loss. Subcutaneous fat may be visible but bone, tendon or muscle are NOT exposed.  Wound Description (Comments):   Present on Admission: Yes  Dressing Type Foam - Lift dressing to assess site every shift 10/23/22 2013     Discharge Diagnoses:  Principal Problem:   Complicated UTI (urinary tract infection) Active Problems:   Type 2 diabetes mellitus (HCC)   HLD (hyperlipidemia)   Essential hypertension   AKI (acute kidney injury) ([place])   Acute proctitis   Sepsis ([place])   Altered mental status   Acute hypoxemic respiratory failure (HCC)   Heart failure with  preserved ejection fraction (HCC)   SVT (supraventricular tachycardia)   Discharge Instructions:  Activity:  As tolerated with Full fall precautions use walker/cane & assistance as needed   Discharge Instructions     Call MD for:  difficulty breathing, headache or visual disturbances   Complete by: As directed    Diet - low sodium heart healthy   Complete by: As directed    Diet general   Complete by: As directed    Discharge instructions   Complete by: As directed    Follow with Primary MD  [name], [name], NP in 1-2 weeks  Please get a complete blood count and chemistry panel checked by your Primary MD at your next visit, and again as instructed by your Primary MD.  Get Medicines reviewed and adjusted: Please take all your medications with you for your next visit with your Primary MD  Laboratory/radiological data: Please request your Primary MD to go over all hospital tests and procedure/radiological results at the follow up, please ask your Primary MD to get all Hospital records sent to his/her office.  In some cases, they will be blood work, cultures and biopsy results pending at the time of your discharge. Please request that your primary care M.D. follows up on these results.  Also Note the following: If you experience worsening of your admission symptoms, develop shortness of breath, life threatening emergency, suicidal or homicidal thoughts you must seek medical attention immediately by calling 911  or calling your MD immediately  if symptoms less severe.  You must read complete instructions/literature along with all the possible adverse reactions/side effects for all the Medicines you take and that have been prescribed to you. Take any new Medicines after you have completely understood and accpet all the possible adverse reactions/side effects.   Do not drive when taking Pain medications or sleeping medications (Benzodaizepines)  Do not take more than prescribed Pain,  Sleep and Anxiety Medications. It is not advisable to combine anxiety,sleep and pain medications without talking with your primary care practitioner  Special Instructions: If you have smoked or chewed Tobacco  in the last 2 yrs please stop smoking, stop any regular Alcohol  and or any Recreational drug use.  Wear Seat belts while driving.  Please note: You were cared for by a hospitalist during your hospital stay. Once you are discharged, your primary care physician will handle any further medical issues. Please note that NO REFILLS for any discharge medications will be authorized once you are discharged, as it is imperative that you return to your primary care physician (or establish a relationship with a primary care physician if you do not have one) for your post hospital discharge needs so that they can reassess your need for medications and monitor your lab values.   Check CBGs before meals and at bedtime   Increase activity slowly   Complete by: As directed    No wound care   Complete by: As directed       Allergies as of 10/24/2022       Reactions   Morphine Other (See Comments)   'took me out of this world' I had to be resuscitated   Codeine Sulfate Nausea Only, Other (See Comments)   GI upset and pain. "When to sleep and did not wake up".    Demerol [meperidine] Itching, Nausea And Vomiting, Swelling   Propoxyphene Hcl Other (See Comments)   Darvocet caused sick headache/swelling         Medication List     STOP taking these medications    ASPERCREME LIDOCAINE EX   aspirin 81 MG tablet   cefTRIAXone 500 MG injection Commonly known as: ROCEPHIN   collagenase 250 UNIT/GM ointment Commonly known as: SANTYL   metoprolol tartrate 25 MG tablet Commonly known as: LOPRESSOR   nystatin powder Commonly known as: MYCOSTATIN/NYSTOP   oxyCODONE 5 MG immediate release tablet Commonly known as: Oxy IR/ROXICODONE   Rybelsus 14 MG Tabs Generic drug: Semaglutide    silver sulfADIAZINE 1 % cream Commonly known as: Silvadene   sodium chloride 0.65 % Soln nasal spray Commonly known as: [place] FlexTouch 200 UNIT/ML FlexTouch Pen Generic drug: insulin degludec       TAKE these medications    acetaminophen 500 MG tablet Commonly known as: TYLENOL Take 1 tablet (500 mg total) by mouth every 6 (six) hours as needed. What changed:  when to take this reasons to take this   atorvastatin 80 MG tablet Commonly known as: Lipitor Take 1 tablet (80 mg total) by mouth daily.   citalopram 20 MG tablet Commonly known as: CELEXA Take 1 tablet (20 mg total) by mouth daily.   Dermacloud Oint Apply 1 application  topically See admin instructions. Apply to buttocks, sacrum topically every shift (three times a day) for protection   esomeprazole 20 MG capsule Commonly known as: NexIUM Take 1 capsule (20 mg total) by mouth daily at 12 noon.   ezetimibe 10  MG tablet Commonly known as: ZETIA TAKE 1 TABLET BY MOUTH  DAILY   FreeStyle Libre Sensor System Misc Use to monitor sugars   gabapentin 300 MG capsule Commonly known as: NEURONTIN TAKE 1 CAPSULE BY MOUTH 3  TIMES DAILY   insulin aspart 100 UNIT/ML injection Commonly known as: novoLOG 0-9 Units, Subcutaneous 3 times a day with meals.  CBG < 70: Implement Hypoglycemia Standing Orders and refer to Hypoglycemia Standing Orders sidebar report CBG 70 - 120: 0 units CBG 121 - 150: 1 unit CBG 151 - 200: 2 units CBG 201 - 250: 3 units CBG 251 - 300: 5 units CBG 301 - 350: 7 units CBG 351 - 400: 9 units CBG > 400: call MD   insulin glargine 100 UNIT/ML Solostar Pen Commonly known as: LANTUS Inject 14 Units into the skin at bedtime.   Insulin Pen Needle 31G X 8 MM Misc Use to inject insulin four times daily E11.41   magnesium oxide 400 (240 Mg) MG tablet Commonly known as: MAG-OX Take 400 mg by mouth 2 (two) times daily.   metFORMIN 500 MG tablet Commonly known as: GLUCOPHAGE Take 500 mg  by mouth in the morning and at bedtime.   metroNIDAZOLE 500 MG tablet Commonly known as: FLAGYL Take 1 tablet (500 mg total) by mouth every 12 (twelve) hours for 2 days.   OneTouch Verio test strip Generic drug: glucose blood USE AS DIRECTED   promethazine 25 MG tablet Commonly known as: PHENERGAN Take 25 mg by mouth every 12 (twelve) hours as needed for nausea or vomiting.   sulfamethoxazole-trimethoprim 800-160 MG tablet Commonly known as: BACTRIM DS Take 1 tablet by mouth every 12 (twelve) hours for 2 days.   torsemide 20 MG tablet Commonly known as: DEMADEX Take 1 tablet (20 mg total) by mouth daily.   Trulicity 1.5 0000000 Sopn Generic drug: Dulaglutide Inject 1.5 mg into the skin every 7 (seven) days. On Saturdays        Contact information for after-discharge care     Destination     [facility] Preferred SNF .   Service: Skilled Nursing Contact information: [place] [phone]                    Allergies  Allergen Reactions   Morphine Other (See Comments)    'took me out of this world' I had to be resuscitated   Codeine Sulfate Nausea Only and Other (See Comments)    GI upset and pain. "When to sleep and did not wake up".    Demerol [Meperidine] Itching, Nausea And Vomiting and Swelling   Propoxyphene Hcl Other (See Comments)    Darvocet caused sick headache/swelling      Other Procedures/Studies: CT HEAD WO CONTRAST (5MM)  Result Date: 10/18/2022 CLINICAL DATA:  76 year old female with delirium. EXAM: CT HEAD WITHOUT CONTRAST TECHNIQUE: Contiguous axial images were obtained from the base of the skull through the vertex without intravenous contrast. RADIATION DOSE REDUCTION: This exam was performed according to the departmental dose-optimization program which includes automated exposure control, adjustment of the mA and/or kV according to patient size and/or use of iterative  reconstruction technique. COMPARISON:  Brain MRI 12/20/2014.  Head CT 05/19/2022. FINDINGS: Brain: Stable cerebral volume. No midline shift, ventriculomegaly, mass effect, evidence of mass lesion, intracranial hemorrhage or evidence of cortically based acute infarction. Gray-white differentiation throughout the brain appears stable and largely normal for age. No cortical encephalomalacia identified.  Vascular: Calcified atherosclerosis at the skull base. No suspicious intracranial vascular hyperdensity. Skull: Stable and negative. Sinuses/Orbits: Trace paranasal sinus fluid levels and mucosal thickening. Tympanic cavities and mastoids remain clear. Small volume of retained secretions in the visible nasopharynx. Other: No acute orbit or scalp soft tissue finding. IMPRESSION: 1. No acute intracranial abnormality. Stable and negative for age noncontrast CT appearance of the brain. 2. Trace paranasal sinus inflammation, significance doubtful. Electronically Signed   By: Odessa Fleming M.D.   On: 10/18/2022 05:34   CT ABDOMEN PELVIS W CONTRAST  Result Date: 10/18/2022 CLINICAL DATA:  Abdominal pain, acute, nonlocalized. EXAM: CT ABDOMEN AND PELVIS WITH CONTRAST TECHNIQUE: Multidetector CT imaging of the abdomen and pelvis was performed using the standard protocol following bolus administration of intravenous contrast. RADIATION DOSE REDUCTION: This exam was performed according to the departmental dose-optimization program which includes automated exposure control, adjustment of the mA and/or kV according to patient size and/or use of iterative reconstruction technique. CONTRAST:  12mL OMNIPAQUE IOHEXOL 350 MG/ML SOLN COMPARISON:  None Available. FINDINGS: Lower chest: Mild bibasilar atelectasis. 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 Extensive multi-vessel coronary artery calcification. Global cardiac size within normal limits. No pericardial effusion. Hepatobiliary: No focal liver abnormality is seen.  Status post cholecystectomy. No biliary dilatation. Pancreas: Atrophic but otherwise unremarkable. Spleen: Unremarkable Adrenals/Urinary Tract: The adrenal glands are unremarkable. The kidneys are normal in size and position. Mild bilateral hydronephrosis. There is superimposed urothelial enhancement in keeping with inflammation. No intrarenal or ureteral calculi. No perinephric fluid collections. No enhancing intrarenal masses. The bladder is markedly thick walled, hyperemic, and demonstrates extensive perivesicular inflammatory stranding in keeping with changes a diffuse infectious or inflammatory cystitis. Foley catheter balloon is seen within the bladder lumen which is largely decompressed. No perivesicular fluid collections are identified. Stomach/Bowel: The stomach, small bowel, and large bowel are unremarkable save for mild descending colonic diverticulosis. There is severe circumferential bowel wall thickening involving the mid and distal rectum as well as extensive perirectal edema suggesting changes of a infectious or inflammatory proctitis. No evidence of obstruction. No free intraperitoneal gas or fluid. The appendix is normal. Vascular/Lymphatic: Aortic atherosclerosis. No enlarged abdominal or pelvic lymph nodes. Reproductive: Prostate is unremarkable. Other: Moderate fat containing supraumbilical ventral hernia is present. Tiny fat containing right inguinal hernia. Musculoskeletal: Probable L4 vertebral hemangioma. No suspicious lytic or blastic bone lesions are identified. No acute bone abnormality. IMPRESSION: 1. Changes in keeping with severe infectious or inflammatory cystitis. Foley catheter balloon is seen within the bladder lumen which is largely decompressed. No perivesicular fluid collections are identified. 2. Mild bilateral hydronephrosis with superimposed urothelial enhancement in keeping with inflammation, possibly reflecting an ascending urinary tract infection given the findings within  the bladder. No intrarenal or ureteral calculi. 3. Severe circumferential bowel wall thickening involving the mid and distal rectum as well as extensive perirectal edema suggesting changes of a infectious or inflammatory proctitis. No obstruction or perforation. 4. Extensive multi-vessel coronary artery calcification. 5. Aortic atherosclerosis. Aortic Atherosclerosis (ICD10-I70.0). Electronically Signed   By: Helyn Numbers M.D.   On: 10/18/2022 01:07   DG Chest 2 View  Result Date: 10/18/2022 CLINICAL DATA:  Hypoxia. EXAM: CHEST - 2 VIEW COMPARISON:  [id number]. FINDINGS: The heart size and mediastinal contours are within normal limits. A TAVR stent is noted. There is atherosclerotic calcification of the aorta. Both lungs are clear. Degenerative changes are present in the thoracic spine. No acute osseous abnormality. IMPRESSION: No active cardiopulmonary disease. Electronically Signed  By: Brett Fairy M.D.   On: 10/18/2022 00:01     TODAY-DAY OF DISCHARGE:  Subjective:   [name] today has no headache,no chest abdominal pain,no new weakness tingling or numbness, feels much better wants to go home today.   Objective:   Blood pressure (!) 100/47, pulse 62, temperature 98.5 F (36.9 C), temperature source Oral, resp. rate 20, height 5\' 4"  (1.626 m), weight 74.3 kg, SpO2 91 %.  Intake/Output Summary (Last 24 hours) at 10/24/2022 0944 Last data filed at 10/23/2022 1519 Gross per 24 hour  Intake 151.4 ml  Output 500 ml  Net -348.6 ml   Filed Weights   10/18/22 0400 10/18/22 1500 10/18/22 2149  Weight: 83.9 kg 74.3 kg 74.3 kg    Exam: Awake Alert, Oriented *3, No new F.N deficits, Normal affect [place].AT,PERRAL Supple Neck,No JVD, No cervical lymphadenopathy appriciated.  Symmetrical Chest wall movement, Good air movement bilaterally, CTAB RRR,No Gallops,Rubs or new Murmurs, No Parasternal Heave +ve B.Sounds, Abd Soft, Non tender, No organomegaly appriciated, No rebound -guarding or  rigidity. No Cyanosis, Clubbing or edema, No new Rash or bruise   PERTINENT RADIOLOGIC STUDIES: No results found.   PERTINENT LAB RESULTS: CBC: Recent Labs    10/22/22 1431  WBC 8.7  HGB 11.5*  HCT 36.3  PLT 237   CMET CMP     Component Value Date/Time   NA 132 (L) 10/24/2022 0325   NA 137 06/30/2020 0909   K 3.3 (L) 10/24/2022 0325   CL 98 10/24/2022 0325   CO2 29 10/24/2022 0325   GLUCOSE 144 (H) 10/24/2022 0325   BUN 14 10/24/2022 0325   BUN 15 06/30/2020 0909   CREATININE 0.82 10/24/2022 0325   CREATININE 0.87 04/24/2022 1231   CALCIUM 8.4 (L) 10/24/2022 0325   PROT 5.2 (L) 10/19/2022 0118   ALBUMIN 1.8 (L) 10/19/2022 0118   AST 19 10/19/2022 0118   ALT 16 10/19/2022 0118   ALKPHOS 152 (H) 10/19/2022 0118   BILITOT 0.4 10/19/2022 0118   GFRNONAA >60 10/24/2022 0325   GFRNONAA >89 02/09/2013 1447   GFRAA 99 06/30/2020 0909   GFRAA >89 02/09/2013 1447    GFR Estimated Creatinine Clearance: 58.5 mL/min (by C-G formula based on SCr of 0.82 mg/dL). No results for input(s): "LIPASE", "AMYLASE" in the last 72 hours. No results for input(s): "CKTOTAL", "CKMB", "CKMBINDEX", "TROPONINI" in the last 72 hours. Invalid input(s): "POCBNP" No results for input(s): "DDIMER" in the last 72 hours. No results for input(s): "HGBA1C" in the last 72 hours. No results for input(s): "CHOL", "HDL", "LDLCALC", "TRIG", "CHOLHDL", "LDLDIRECT" in the last 72 hours. Recent Labs    10/23/22 0700  TSH 4.448   No results for input(s): "VITAMINB12", "FOLATE", "FERRITIN", "TIBC", "IRON", "RETICCTPCT" in the last 72 hours. Coags: No results for input(s): "INR" in the last 72 hours.  Invalid input(s): "PT" Microbiology: Recent Results (from the past 240 hour(s))  Blood culture (routine x 2)     Status: None   Collection Time: 10/17/22 11:00 PM   Specimen: BLOOD LEFT ARM  Result Value Ref Range Status   Specimen Description BLOOD LEFT ARM  Final   Special Requests   Final     BOTTLES DRAWN AEROBIC AND ANAEROBIC Blood Culture adequate volume   Culture   Final    NO GROWTH 5 DAYS Performed at [facility], [address], [place],  [address]    Report Status 10/23/2022 FINAL  Final  Urine Culture  Status: Abnormal   Collection Time: 10/18/22 12:04 AM   Specimen: Urine, Catheterized  Result Value Ref Range Status   Specimen Description URINE, CATHETERIZED  Final   Special Requests NONE  Final   Culture (A)  Final    >=100,000 COLONIES/mL KLEBSIELLA PNEUMONIAE 30,000 COLONIES/mL ESCHERICHIA COLI Confirmed Extended Spectrum Beta-Lactamase Producer (ESBL).  In bloodstream infections from ESBL organisms, carbapenems are preferred over piperacillin/tazobactam. They are shown to have a lower risk of mortality. FOR KLEBSIELLA PNEUMONIAE Performed at [facility], [place] [address] [place], [place] [address]    Report Status 10/21/2022 FINAL  Final   Organism ID, Bacteria ESCHERICHIA COLI (A)  Final   Organism ID, Bacteria KLEBSIELLA PNEUMONIAE (A)  Final      Susceptibility   Escherichia coli - MIC*    AMPICILLIN >=32 RESISTANT Resistant     CEFAZOLIN >=64 RESISTANT Resistant     CEFEPIME <=0.12 SENSITIVE Sensitive     CEFTRIAXONE 1 SENSITIVE Sensitive     CIPROFLOXACIN <=0.25 SENSITIVE Sensitive     GENTAMICIN <=1 SENSITIVE Sensitive     IMIPENEM <=0.25 SENSITIVE Sensitive     NITROFURANTOIN <=16 SENSITIVE Sensitive     TRIMETH/SULFA <=20 SENSITIVE Sensitive     AMPICILLIN/SULBACTAM >=32 RESISTANT Resistant     PIP/TAZO 8 SENSITIVE Sensitive     * 30,000 COLONIES/mL ESCHERICHIA COLI   Klebsiella pneumoniae - MIC*    AMPICILLIN >=32 RESISTANT Resistant     CEFAZOLIN >=64 RESISTANT Resistant     CEFEPIME >=32 RESISTANT Resistant     CEFTRIAXONE >=64 RESISTANT Resistant     CIPROFLOXACIN 1 RESISTANT Resistant     GENTAMICIN <=1 SENSITIVE Sensitive     IMIPENEM 2 SENSITIVE Sensitive     NITROFURANTOIN 64 INTERMEDIATE Intermediate      TRIMETH/SULFA >=320 RESISTANT Resistant     AMPICILLIN/SULBACTAM >=32 RESISTANT Resistant     PIP/TAZO 16 SENSITIVE Sensitive     * >=100,000 COLONIES/mL KLEBSIELLA PNEUMONIAE  Blood culture (routine x 2)     Status: None   Collection Time: 10/18/22  1:45 AM   Specimen: BLOOD RIGHT ARM  Result Value Ref Range Status   Specimen Description BLOOD RIGHT ARM  Final   Special Requests   Final    BOTTLES DRAWN AEROBIC AND ANAEROBIC Blood Culture adequate volume   Culture   Final    NO GROWTH 5 DAYS Performed at [facility], [address], [place], [place] [address]    Report Status 10/23/2022 FINAL  Final    FURTHER DISCHARGE INSTRUCTIONS:  Get Medicines reviewed and adjusted: Please take all your medications with you for your next visit with your Primary MD  Laboratory/radiological data: Please request your Primary MD to go over all hospital tests and procedure/radiological results at the follow up, please ask your Primary MD to get all Hospital records sent to his/her office.  In some cases, they will be blood work, cultures and biopsy results pending at the time of your discharge. Please request that your primary care M.D. goes through all the records of your hospital data and follows up on these results.  Also Note the following: If you experience worsening of your admission symptoms, develop shortness of breath, life threatening emergency, suicidal or homicidal thoughts you must seek medical attention immediately by calling 911 or calling your MD immediately  if symptoms less severe.  You must read complete instructions/literature along with all the possible adverse reactions/side effects for all the Medicines you take and that  have been prescribed to you. Take any new Medicines after you have completely understood and accpet all the possible adverse reactions/side effects.   Do not drive when taking Pain medications or sleeping medications (Benzodaizepines)  Do not take  more than prescribed Pain, Sleep and Anxiety Medications. It is not advisable to combine anxiety,sleep and pain medications without talking with your primary care practitioner  Special Instructions: If you have smoked or chewed Tobacco  in the last 2 yrs please stop smoking, stop any regular Alcohol  and or any Recreational drug use.  Wear Seat belts while driving.  Please note: You were cared for by a hospitalist during your hospital stay. Once you are discharged, your primary care physician will handle any further medical issues. Please note that NO REFILLS for any discharge medications will be authorized once you are discharged, as it is imperative that you return to your primary care physician (or establish a relationship with a primary care physician if you do not have one) for your post hospital discharge needs so that they can reassess your need for medications and monitor your lab values.  Total Time spent coordinating discharge including counseling, education and face to face time equals greater than 30 minutes.  SignedOren Binet 10/24/2022 9:44 AM

## 2022-10-24 NOTE — Progress Notes (Signed)
Rounding Note    Patient Name: [name] Date of Encounter: 10/24/2022  [facility] Cardiologist: Chilton Si, MD   Subjective   Feeling much better.  No recurrent SVT.  Inpatient Medications    Scheduled Meds:  atorvastatin  80 mg Oral Daily   Chlorhexidine Gluconate Cloth  6 each Topical Daily   citalopram  20 mg Oral Daily   enoxaparin (LOVENOX) injection  40 mg Subcutaneous Daily   ezetimibe  10 mg Oral Daily   gabapentin  300 mg Oral TID   insulin aspart  0-9 Units Subcutaneous Q4H   insulin glargine-yfgn  14 Units Subcutaneous QHS   metroNIDAZOLE  500 mg Oral Q12H   pantoprazole  40 mg Oral Daily   potassium chloride  40 mEq Oral Q4H   sulfamethoxazole-trimethoprim  1 tablet Oral Q12H   torsemide  20 mg Oral Daily   Continuous Infusions:  amiodarone Stopped (10/23/22 0700)   PRN Meds: acetaminophen **OR** acetaminophen, metoprolol tartrate, mouth rinse   Vital Signs    Vitals:   10/23/22 2011 10/23/22 2302 10/24/22 0335 10/24/22 0717  BP: 90/64 (!) 79/46 (!) 93/49 (!) 100/47  Pulse: 64 62 (!) 57 62  Resp: 16 16 16 20   Temp: 98.3 F (36.8 C) 98.6 F (37 C) 98 F (36.7 C) 98.5 F (36.9 C)  TempSrc: Oral Oral Oral Oral  SpO2: 94% 93% 92% 91%  Weight:      Height:        Intake/Output Summary (Last 24 hours) at 10/24/2022 0923 Last data filed at 10/23/2022 1519 Gross per 24 hour  Intake 151.4 ml  Output 500 ml  Net -348.6 ml      10/18/2022    9:49 PM 10/18/2022    3:00 PM 10/18/2022    4:00 AM  Last 3 Weights  Weight (lbs) 163 lb 12.8 oz 163 lb 12.8 oz 185 lb  Weight (kg) 74.3 kg 74.3 kg 83.915 kg      Telemetry    Sinus rhythm.  No events.- Personally Reviewed  ECG    10/23/2022: Sinus rhythm.  Rate 69 bpm.  LAFB.  LVH. - Personally Reviewed  Physical Exam   VS:  BP (!) 100/47 (BP Location: Left Arm)   Pulse 62   Temp 98.5 F (36.9 C) (Oral)   Resp 20   Ht 5\' 4"  (1.626 m)   Wt 74.3 kg   SpO2 91%    BMI 28.12 kg/m  , BMI Body mass index is 28.12 kg/m. GENERAL:  Well appearing HEENT: Pupils equal round and reactive, fundi not visualized, oral mucosa unremarkable NECK:  No jugular venous distention, waveform within normal limits, carotid upstroke brisk and symmetric, no bruits, no thyromegaly LUNGS:  Clear to auscultation bilaterally HEART:  RRR.  PMI not displaced or sustained,S1 and S2 within normal limits, no S3, no S4, no clicks, no rubs, no murmurs ABD:  Flat, positive bowel sounds normal in frequency in pitch, no bruits, no rebound, no guarding, no midline pulsatile mass, no hepatomegaly, no splenomegaly EXT:  2 plus pulses throughout, no edema, no cyanosis no clubbing SKIN:  No rashes no nodules NEURO:  Cranial nerves II through XII grossly intact, motor grossly intact throughout PSYCH:  Cognitively intact, oriented to person place and time   Labs    High Sensitivity Troponin:  No results for input(s): "TROPONINIHS" in the last 720 hours.   Chemistry Recent Labs  Lab 10/19/22 0118 10/20/22 0244 10/22/22 1431 10/23/22 0700 10/24/22  0325  NA 133*   < > 136 137 132*  K 3.4*   < > 4.2 3.6 3.3*  CL 100   < > 100 103 98  CO2 23   < > 30 27 29   GLUCOSE 193*   < > 209* 125* 144*  BUN 27*   < > 16 15 14   CREATININE 0.53   < > 0.81 0.61 0.82  CALCIUM 8.0*   < > 8.3* 8.4* 8.4*  MG  --    < > 1.6* 2.1 1.9  PROT 5.2*  --   --   --   --   ALBUMIN 1.8*  --   --   --   --   AST 19  --   --   --   --   ALT 16  --   --   --   --   ALKPHOS 152*  --   --   --   --   BILITOT 0.4  --   --   --   --   GFRNONAA >60   < > >60 >60 >60  ANIONGAP 10   < > 6 7 5    < > = values in this interval not displayed.    Lipids No results for input(s): "CHOL", "TRIG", "HDL", "LABVLDL", "LDLCALC", "CHOLHDL" in the last 168 hours.  Hematology Recent Labs  Lab 10/18/22 0413 10/19/22 0118 10/22/22 1431  WBC 17.3* 11.9* 8.7  RBC 3.10* 3.31* 4.16  HGB 8.8* 9.2* 11.5*  HCT 26.6* 29.3* 36.3   MCV 85.8 88.5 87.3  MCH 28.4 27.8 27.6  MCHC 33.1 31.4 31.7  RDW 15.0 14.9 14.9  PLT 169 165 237   Thyroid  Recent Labs  Lab 10/22/22 1426 10/23/22 0700  TSH  --  4.448  FREET4 1.46*  --     BNPNo results for input(s): "BNP", "PROBNP" in the last 168 hours.  DDimer No results for input(s): "DDIMER" in the last 168 hours.   Radiology    No results found.  Cardiac Studies   Echo 05/11/2021: IMPRESSIONS    1. The aortic valve has been repaired/replaced. Aortic valve  regurgitation is not visualized. No aortic stenosis is present. There is a  23 mm Sapien prosthetic (TAVR) valve present in the aortic position.  Procedure Date: 05/17/20. Aortic valve mean  gradient measures 18.8 mmHg. Aortic valve Vmax measures 3.04 m/s. Aortic  valve acceleration time measures 70 msec.   2. Left ventricular ejection fraction, by estimation, is 60 to 65%. The  left ventricle has normal function. The left ventricle has no regional  wall motion abnormalities. There is moderate asymmetric left ventricular  hypertrophy of the basal-septal  segment. Left ventricular diastolic parameters are consistent with Grade  II diastolic dysfunction (pseudonormalization).   3. Right ventricular systolic function is normal. The right ventricular  size is normal. Tricuspid regurgitation signal is inadequate for assessing  PA pressure.   4. Left atrial size was mildly dilated.   5. The mitral valve is normal in structure. Trivial mitral valve  regurgitation. No evidence of mitral stenosis.   6. The inferior vena cava is normal in size with greater than 50%  respiratory variability, suggesting right atrial pressure of 3 mmHg.   Patient Profile     76 y.o. female with aortic stenosis status post TAVR, hypertension, hyperlipidemia, diabetes, and lymphedema admitted with sepsis from a urinary source.  Cardiology consulted for SVT.  Assessment & Plan    # SVT:  Stabilized with amiodarone.  She had recurrent  episodes requiring adenosine.  She was hypotensive and amiodarone was stopped.  Metoprolol 25mg  bid was started but, her BP will not tolerate it. Will hold metoprolol.  She does report that she had some episodes of palpitations at home.  Recommend 14 day ambulatory monitor at discharge and outpatient follow-up.  # Hypertension: She was hypotensive during her episode of SVT.  Given that her blood pressure is improved she should be able to tolerate metoprolol today.  # Aortic stenosis status post TAVR: Last echo in 2022 showed a stable valve with a mean gradient 19 mmHg.  Patient refused repeat echo.   # Hyperlipidemia: Continue atorvastatin and Zetia  # Lymphedema: Stable.  Continue torsemide.  [facility] will sign off.   Medication Recommendations:  hold metoprolol Other recommendations (labs, testing, etc):  14 day monitor Follow up as an outpatient:  we will arrange      For questions or updates, please contact  [facility] Please consult [email or web address] for contact info under        Signed, 2023, MD  10/24/2022, 9:23 AM

## 2022-10-24 NOTE — Progress Notes (Signed)
PTAR on unit to transport pt to [facility]. PTAR given pt discharge packet and all belongings.

## 2022-10-29 ENCOUNTER — Other Ambulatory Visit: Payer: Self-pay | Admitting: *Deleted

## 2022-10-29 NOTE — Patient Outreach (Signed)
Per [facility] Ms. Wilford resides in [facility].   Confirmed with Jill Side, SNF social worker Ms. Burkman is LTC resident.   No identifiable [facility] care coordination needs.  Raiford Noble, MSN, RN,BSN [facility] Post Acute Care Coordinator [phone] (Direct dial)

## 2022-11-12 ENCOUNTER — Telehealth (HOSPITAL_BASED_OUTPATIENT_CLINIC_OR_DEPARTMENT_OTHER): Payer: Self-pay

## 2022-11-12 NOTE — Telephone Encounter (Addendum)
Left message for patient to call back    ----- Message from Alver Sorrow, NP sent at 11/12/2022 10:33 AM EST ----- Can we just call to (1) remind her to return ZIO and (2) let her know results won't be available at time of her appt Friday. We can keep the appt as is or delay it a couple weeks if she prefers to discuss monitor results in person. TY!

## 2022-11-13 NOTE — Telephone Encounter (Signed)
Returned call to patient, she is currently at [facility] in [place]. She states she hasn't had anyone to help her return her heart monitor and she doesn't have any transportation arranged for her appointment on Friday. Rescheduled patient for 12/28 at 2:45pm with Gillian Shields, NP and informed patient that I would reach out to the nursing home to get her some help.   Reached out to [facility] nursing [facility] and spoke with [place]. She states the patient is very confused and that she ripped off the monitor several times, they tried to put it back on but eventually just took it off due to it causing the patient distress. Tresa Endo states she will work on getting it sent back and if they don't have the box still instructed to bring it to appointment with patient. Updated kelly about rescheduled appointment so transportation could be arranged.

## 2022-11-14 NOTE — Telephone Encounter (Signed)
Appreciate your help! Agree with plan as provided.   Alver Sorrow, NP

## 2022-11-15 ENCOUNTER — Ambulatory Visit (HOSPITAL_BASED_OUTPATIENT_CLINIC_OR_DEPARTMENT_OTHER): Payer: Medicare Other | Admitting: Family

## 2022-12-04 IMAGING — DX DG HIP (WITH OR WITHOUT PELVIS) 2-3V*R*
3 series · 3 of 3 positions shown · non-contrast
Comparison: Radiographs 05/10/2020.

CLINICAL DATA: Fall in bathtub 4 days ago.  Pressure sores.

EXAM:
DG HIP (WITH OR WITHOUT PELVIS) 2-3V RIGHT

[pelvis ap]
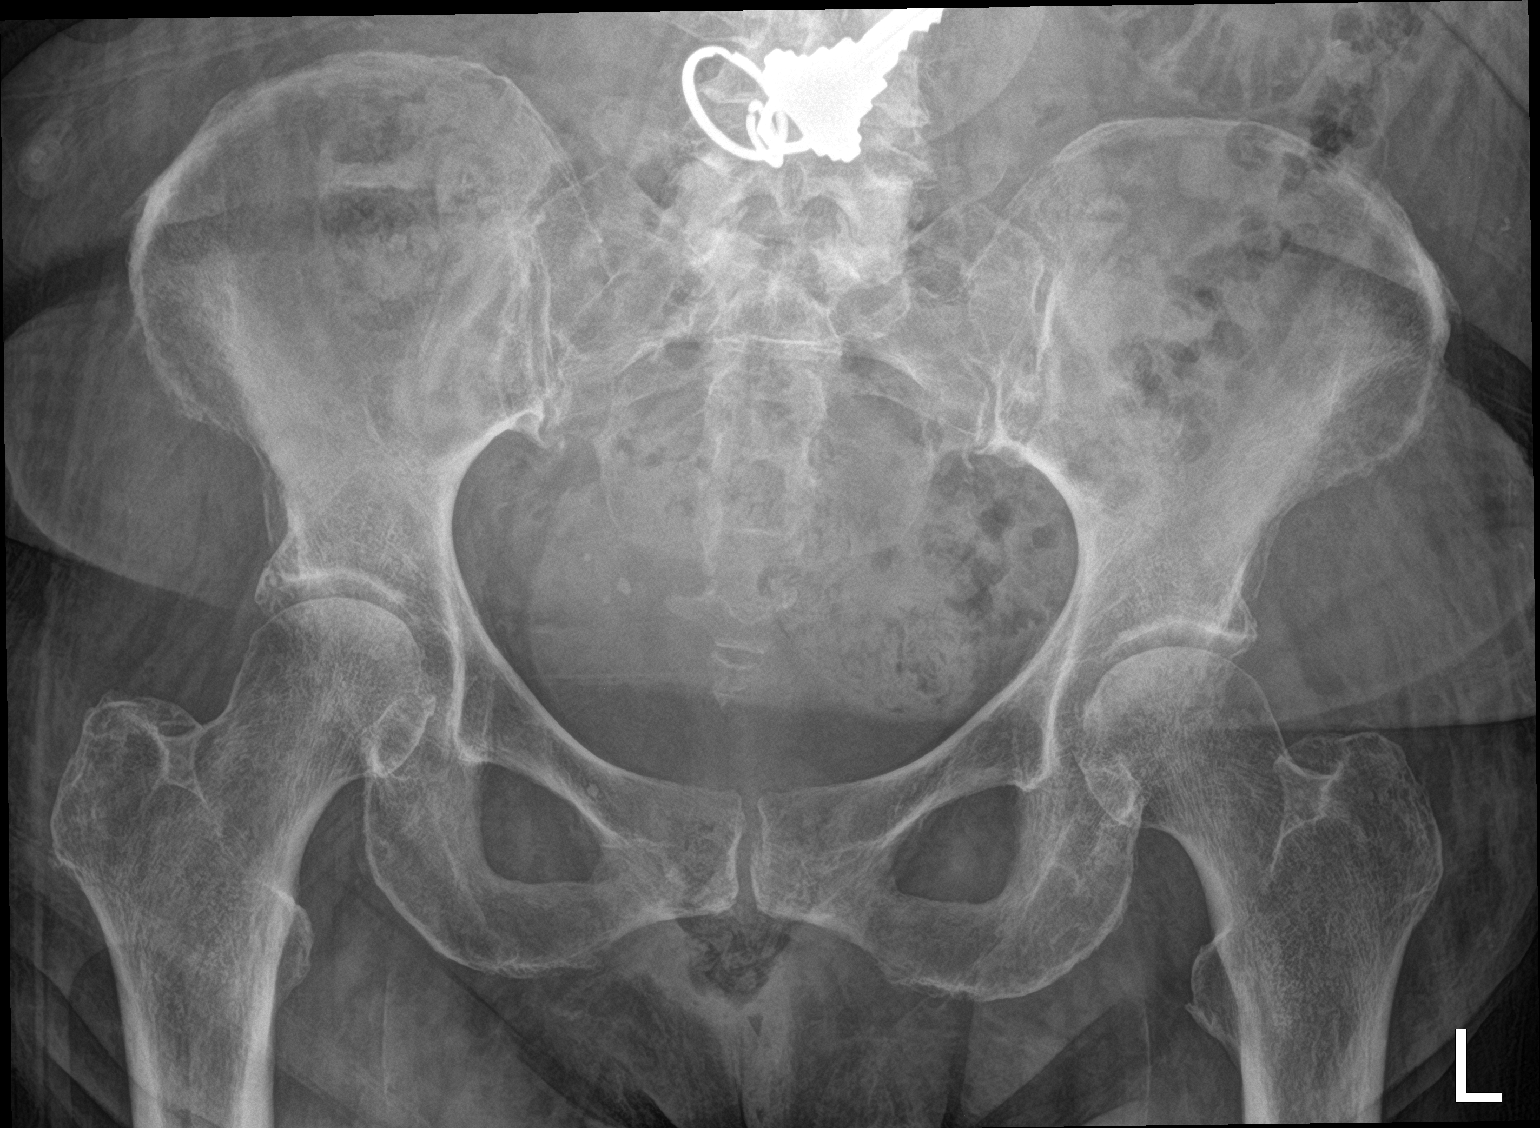

[hip ap]
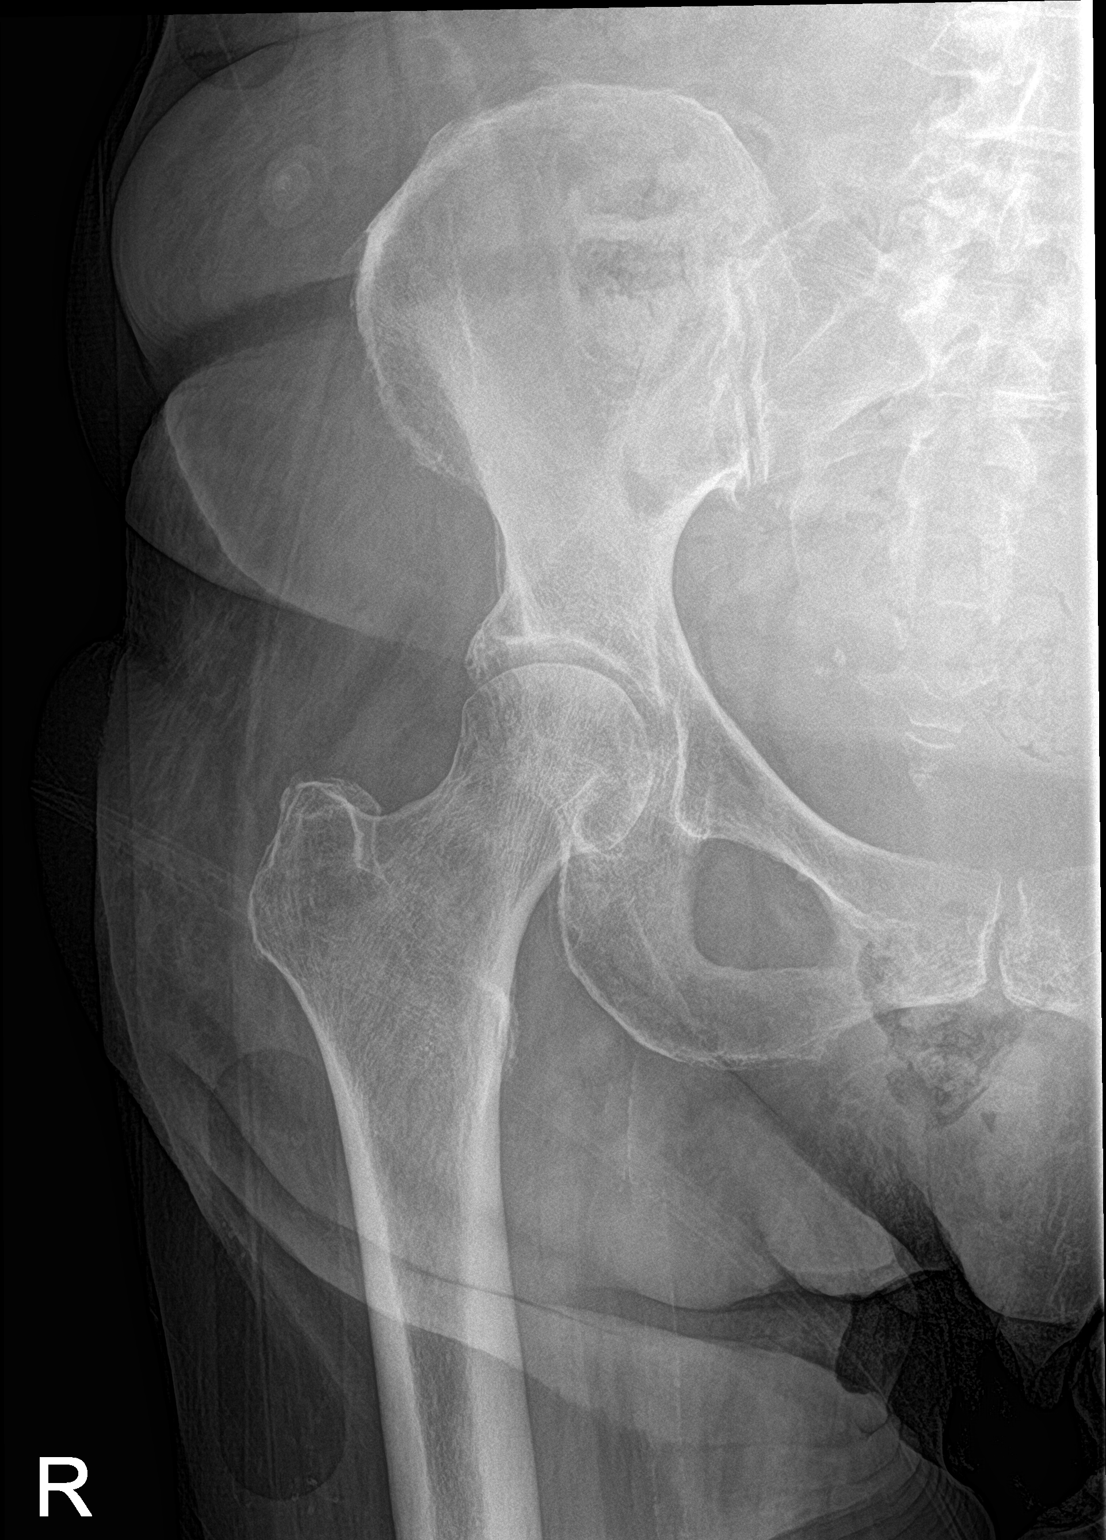

[hip lat]
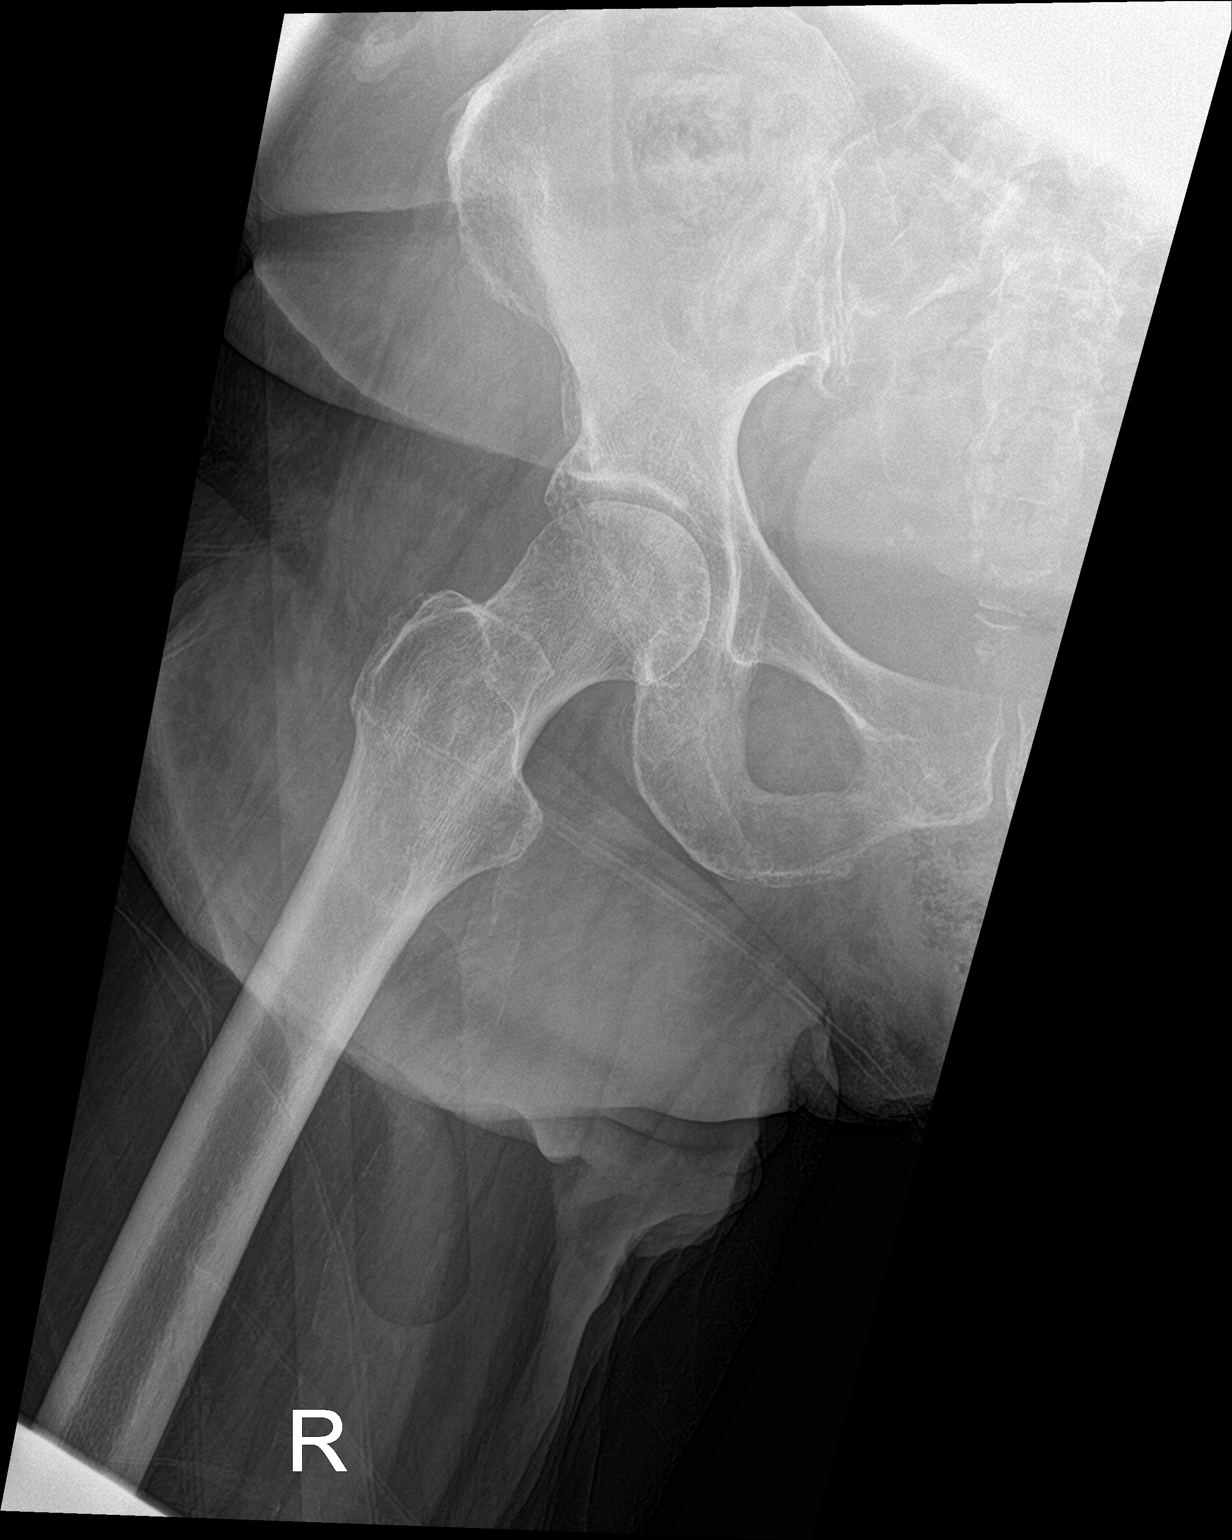

[3 of 3 positions shown; findings below may reference images not displayed]

FINDINGS: AP pelvis with AP and frog-leg lateral views of the right hip. The
mineralization and alignment are normal. There is no evidence of
acute fracture or dislocation. No evidence of femoral head avascular
necrosis. The hip joint spaces are preserved. Mild sacroiliac
degenerative changes are present bilaterally. There is mild lower
lumbar spondylosis. A key overlying the lower lumbar spine is
presumed to be external to the patient. Femoral atherosclerosis
noted.
IMPRESSION: No acute osseous findings at the right hip or pelvis.

## 2022-12-05 ENCOUNTER — Encounter (HOSPITAL_BASED_OUTPATIENT_CLINIC_OR_DEPARTMENT_OTHER): Payer: Self-pay | Admitting: Family

## 2022-12-05 ENCOUNTER — Ambulatory Visit (INDEPENDENT_AMBULATORY_CARE_PROVIDER_SITE_OTHER): Payer: Medicare Other | Admitting: Family

## 2022-12-05 VITALS — BP 90/52 | HR 85 | Ht 64.0 in | Wt 154.0 lb

## 2022-12-05 DIAGNOSIS — R29898 Other symptoms and signs involving the musculoskeletal system: Secondary | ICD-10-CM

## 2022-12-05 DIAGNOSIS — E782 Mixed hyperlipidemia: Secondary | ICD-10-CM | POA: Diagnosis not present

## 2022-12-05 DIAGNOSIS — I10 Essential (primary) hypertension: Secondary | ICD-10-CM

## 2022-12-05 DIAGNOSIS — I471 Supraventricular tachycardia, unspecified: Secondary | ICD-10-CM

## 2022-12-05 DIAGNOSIS — I890 Lymphedema, not elsewhere classified: Secondary | ICD-10-CM | POA: Diagnosis not present

## 2022-12-05 DIAGNOSIS — Z952 Presence of prosthetic heart valve: Secondary | ICD-10-CM | POA: Diagnosis not present

## 2022-12-05 NOTE — Patient Instructions (Addendum)
Medication Instructions:  Continue your current medications.   *If you need a refill on your cardiac medications before your next appointment, please call your pharmacy*   Lab Work/Testing/Procedures: No labs or testing ordered today.   We will mail in your ZIO monitor and call you with the final report. The preliminary review shows occasional episodes of a fast heart beat called SVT but these episodes are short and you are not noticing significant palpitations which is good.    Follow-Up: At [facility], you and your health needs are our priority.  As part of our continuing mission to provide you with exceptional heart care, we have created designated Provider Care Teams.  These Care Teams include your primary Cardiologist (physician) and Advanced Practice Providers (APPs -  Physician Assistants and Nurse Practitioners) who all work together to provide you with the care you need, when you need it.  We recommend signing up for the patient portal called "MyChart".  Sign up information is provided on this After Visit Summary.  MyChart is used to connect with patients for Virtual Visits (Telemedicine).  Patients are able to view lab/test results, encounter notes, upcoming appointments, etc.  Non-urgent messages can be sent to your provider as well.   To learn more about what you can do with MyChart, go to [email or web address].    Your next appointment:   6 month(s)  The format for your next appointment:   In Person  Provider:   Skeet Latch, MD or Laurann Montana, NP    Other Instructions  To prevent or reduce lower extremity swelling: Eat a low salt diet. Salt makes the body hold onto extra fluid which causes swelling. Sit with legs elevated. For example, in the recliner or on an [place].  Wear knee-high compression stockings during the daytime. Ones labeled 15-20 mmHg provide good compression.    Exercises to do While Sitting Warm-up Before starting other  exercises: Sit up as straight as you can. Have your knees bent at 90 degrees, which is the shape of the capital letter "L." Keep your feet flat on the floor. Sit at the front edge of your chair, if you can. Pull in (tighten) the muscles in your abdomen and stretch your spine and neck as straight as you can. Hold this position for a few minutes. Breathe in and out evenly. Try to concentrate on your breathing, and relax your mind. Stretching Exercise A: Arm stretch Hold your arms out straight in front of your body. Bend your hands at the wrist with your fingers pointing up, as if signaling someone to stop. Notice the slight tension in your forearms as you hold the position. Keeping your arms out and your hands bent, rotate your hands outward as far as you can and hold this stretch. Aim to have your thumbs pointing up and your pinkie fingers pointing down. Slowly repeat arm stretches for one minute as tolerated. Exercise B: Leg stretch If you can move your legs, try to "draw" letters on the floor with the toes of your foot. Write your name with one foot. Write your name with the toes of your other foot. Slowly repeat the movements for one minute as tolerated. Exercise C: Reach for the sky Reach your hands as far over your head as you can to stretch your spine. Move your hands and arms as if you are climbing a rope. Slowly repeat the movements for one minute as tolerated. Range of motion exercises Exercise A: Shoulder roll Let your arms  hang loosely at your sides. Lift just your shoulders up toward your ears, then let them relax back down. When your shoulders feel loose, rotate your shoulders in backward and forward circles. Do shoulder rolls slowly for one minute as tolerated. Exercise B: March in place As if you are marching, pump your arms and lift your legs up and down. Lift your knees as high as you can. If you are unable to lift your knees, just pump your arms and move your ankles and  feet up and down. March in place for one minute as tolerated. Exercise C: Seated jumping jacks Let your arms hang down straight. Keeping your arms straight, lift them up over your head. Aim to point your fingers to the ceiling. While you lift your arms, straighten your legs and slide your heels along the floor to your sides, as wide as you can. As you bring your arms back down to your sides, slide your legs back together. If you are unable to use your legs, just move your arms. Slowly repeat seated jumping jacks for one minute as tolerated. Strengthening exercises Exercise A: Shoulder squeeze Hold your arms straight out from your body to your sides, with your elbows bent and your fists pointed at the ceiling. Keeping your arms in the bent position, move them forward so your elbows and forearms meet in front of your face. Open your arms back out as wide as you can with your elbows still bent, until you feel your shoulder blades squeezing together. Hold for 5 seconds. Slowly repeat the movements forward and backward for one minute as tolerated.

## 2022-12-05 NOTE — Progress Notes (Signed)
Office Visit    Patient Name: [name] Date of Encounter: 12/05/2022  PCP:  Caesar Bookman, NP   [facility]  Cardiologist:  Chilton Si, MD  Advanced Practice Provider:  No care team member to display Electrophysiologist:  None      Chief Complaint    [name] is a 76 y.o. 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 Laneta Simmers and Excell Seltzer   Venous insufficiency    Ventral hernia    Past Surgical History:  Procedure Laterality Date   ABDOMINAL HYSTERECTOMY  2001   TAH-BSO   CHOLECYSTECTOMY  2001   COLONOSCOPY  2013   diverticulosis    ESOPHAGOGASTRODUODENOSCOPY  2013   normal    FEMUR IM NAIL Left 05/19/2022   Procedure: INTRAMEDULLARY (IM) RETROGRADE FEMORAL NAILING;  Surgeon: Huel Cote, MD;  Location: [facility] OR;  Service: Orthopedics;  Laterality: Left;   INCISIONAL HERNIA REPAIR     RIGHT/LEFT HEART CATH AND CORONARY ANGIOGRAPHY N/A 05/09/2020   Procedure: RIGHT/LEFT HEART CATH AND CORONARY ANGIOGRAPHY;  Surgeon: Corky Crafts, MD;  Location: [facility] INVASIVE CV LAB;  Service: Cardiovascular;  Laterality: N/A;   TEE WITHOUT CARDIOVERSION N/A 05/17/2020   Procedure: TRANSESOPHAGEAL ECHOCARDIOGRAM (TEE);  Surgeon: Tonny Bollman, MD;  Location: [facility] INVASIVE CV LAB;  Service: Open Heart  Surgery;  Laterality: N/A;   TONSILLECTOMY     TRANSCATHETER AORTIC VALVE REPLACEMENT, TRANSFEMORAL N/A 05/17/2020   Procedure: TRANSCATHETER AORTIC VALVE REPLACEMENT, TRANSFEMORAL;  Surgeon: Tonny Bollman, MD;  Location: [facility] INVASIVE CV LAB;  Service: Open Heart Surgery;  Laterality: N/A;    Allergies  Allergies  Allergen Reactions   Morphine Other (See Comments)    'took me out of this world' I had to be resuscitated   Codeine Sulfate Nausea Only and Other (See Comments)    GI upset and pain. "When to sleep and did not wake up".    Demerol [Meperidine] Itching, Nausea And Vomiting and Swelling   Propoxyphene Hcl Other (See Comments)    Darvocet caused sick headache/swelling     History of Present Illness    [name] is a 76 y.o. female with a hx of SVT, LAFB, HTN, aortic stenosis s/p TAVR 05/2020 (33mm Edwards S3U via TF approach), HLD, lymphedema  last seen while hospitalized.  Cardiac cath 05/2020 with nonobstructive disease (25% in 1st OM, 2nd OM and severe AS). Echo 06/07/21 stable aortic valveno AS nor AR with mean gradient 18.8 mmHg with EF 60-65%.   Admitted 10/17/22 with SVT in setting of severe cystitis, acute proctitis, severe sepsis. She declined repeat echo during admission. SVT stabilized with Amiodarone but had to be stopped due to hypotension. BP did  not tolerate Metoprolol. 2 week ZIO placed prior to discharge for evaluation of SVT.   Presents today with CMA from [facility]. She notes no palpitations. Reports no shortness of breath at rest and mild dyspnea on exertion with more than usual activity. She notes this with activities she used to do around her home such as raking prior to stay at [facility]. Reports no chest pain, pressure, or tightness. No edema, orthopnea, PND. Reports no palpitations.  She does have color changes to bilateral LE due to venous stasis and reassurance provided. Drinks mostly water and an occasional Coke Zero. Notes rare lightheadedness with  position changes. She is wheelchair bound at SNF.   EKGs/Labs/Other Studies Reviewed:   The following studies were reviewed today:  EKG:  EKG is not ordered today.    Recent Labs: 10/19/2022: ALT 16 10/22/2022: Hemoglobin 11.5; Platelets 237 10/23/2022: TSH 4.448 10/24/2022: BUN 14; Creatinine, Ser 0.82; Magnesium 1.9; Potassium 3.3; Sodium 132  Recent Lipid Panel    Component Value Date/Time   CHOL 204 (H) 04/24/2022 1231   TRIG 163 (H) 04/24/2022 1231   HDL 43 (L) 04/24/2022 1231   CHOLHDL 4.7 04/24/2022 1231   VLDL 36.0 06/12/2021 1436   LDLCALC 131 (H) 04/24/2022 1231   LDLDIRECT 110.0 11/12/2018 1419   Home Medications   Current Meds  Medication Sig   acetaminophen (TYLENOL) 500 MG tablet Take 1 tablet (500 mg total) by mouth every 6 (six) hours as needed. (Patient taking differently: Take 500 mg by mouth every 4 (four) hours as needed for mild pain.)   atorvastatin (LIPITOR) 80 MG tablet Take 1 tablet (80 mg total) by mouth daily.   citalopram (CELEXA) 20 MG tablet Take 1 tablet (20 mg total) by mouth daily.   Continuous Blood Gluc Sensor (FREESTYLE LIBRE SENSOR SYSTEM) MISC Use to monitor sugars   Dulaglutide (TRULICITY) 1.5 MG/0.5ML SOPN Inject 1.5 mg into the skin every 7 (seven) days. On Saturdays   esomeprazole (NEXIUM) 20 MG capsule Take 1 capsule (20 mg total) by mouth daily at 12 noon.   ezetimibe (ZETIA) 10 MG tablet TAKE 1 TABLET BY MOUTH  DAILY (Patient taking differently: Take 10 mg by mouth daily.)   gabapentin (NEURONTIN) 300 MG capsule TAKE 1 CAPSULE BY MOUTH 3  TIMES DAILY (Patient taking differently: Take 300 mg by mouth 3 (three) times daily.)   Infant Care Products (DERMACLOUD) OINT Apply 1 application  topically See admin instructions. Apply to buttocks, sacrum topically every shift (three times a day) for protection   insulin glargine (LANTUS) 100 UNIT/ML Solostar Pen Inject 14 Units into the skin at bedtime.   Insulin Pen Needle 31G X 8 MM MISC Use to  inject insulin four times daily E11.41   magnesium oxide (MAG-OX) 400 (240 Mg) MG tablet Take 400 mg by mouth 2 (two) times daily.   metFORMIN (GLUCOPHAGE) 500 MG tablet Take 500 mg by mouth in the morning and at bedtime.   ONETOUCH VERIO test strip USE AS DIRECTED   torsemide (DEMADEX) 20 MG tablet Take 1 tablet (20 mg total) by mouth daily.     Review of Systems      All other systems reviewed and are otherwise negative except as noted above.  Physical Exam    VS:  BP (!) 90/52   Pulse 85   Ht 5\' 4"  (1.626 m)   Wt 154 lb (69.9 kg)   SpO2 97%   BMI 26.43 kg/m  , BMI Body mass index is  26.43 kg/m.  Wt Readings from Last 3 Encounters:  12/05/22 154 lb (69.9 kg)  10/18/22 163 lb 12.8 oz (74.3 kg)  05/22/22 188 lb 11.4 oz (85.6 kg)    GEN: Well nourished, overweight, well developed, in no acute distress. HEENT: normal. Neck: Supple, no JVD, carotid bruits, or masses. Cardiac: RRR, no murmurs, rubs, or gallops. No clubbing, cyanosis, edema.  Radials/PT 2+ and equal bilaterally.  Respiratory:  Respirations regular and unlabored, clear to auscultation bilaterally. GI: Soft, nontender, nondistended. MS: No deformity or atrophy. Skin: Warm and dry, no rash. Color changes c/w venous stasis bilateral LE. Neuro:  Strength and sensation are intact. Psych: Normal affect.  Assessment & Plan    SVT - Noted during admission. Did not tolerate amiodarone nor metoprolol due to hypotension. She notes no palpitations. ZIO AT monitor brought with her to appointment to return. Preliminary review of ZIO Suite daily reports shows occasional SVT up to 30 seconds. However, given she is asymptomatic will monitor and await formal results. If significant SVT will need to consider EP input for AAD.  HTN - now with asymptomatic hypotension. Continue present dose torsemide, as below. If she develops symptomatic hypotension could trial QOD dosing with monitoring of edema.   AS s/p TAVR - Echo 2022 stable  valve with mean gradient . No murmur on exam. She declined echocardiogram during recent admission.  HLD - continue atorvastatin, zetia.  Deconditioning - provided her list of chair exercises to complete at SNF. She is predominantly wheelchair bound.   Lymphedema - stable. Continue torsemide. Encouraged to continue to elevate legs.          Disposition: Follow up in 6 month(s) with Chilton Si, MD or APP.  Signed, Alver Sorrow, NP 12/05/2022, 3:12 PM [facility]

## 2022-12-18 NOTE — Addendum Note (Signed)
Encounter addended by: Markus Daft A on: 12/18/2022 8:12 AM  Actions taken: Imaging Exam ended

## 2022-12-31 ENCOUNTER — Other Ambulatory Visit: Payer: Self-pay

## 2023-01-01 ENCOUNTER — Telehealth (HOSPITAL_BASED_OUTPATIENT_CLINIC_OR_DEPARTMENT_OTHER): Payer: Self-pay | Admitting: Cardiovascular Disease

## 2023-01-01 ENCOUNTER — Other Ambulatory Visit: Payer: Self-pay

## 2023-01-01 DIAGNOSIS — I471 Supraventricular tachycardia, unspecified: Secondary | ICD-10-CM

## 2023-01-01 NOTE — Telephone Encounter (Signed)
Faxed as requested, confirmation received  

## 2023-01-01 NOTE — Telephone Encounter (Signed)
Left message for patient to call and schedule 6 month follow up with Dr. [place] 

## 2023-01-01 NOTE — Telephone Encounter (Signed)
Kelly the Doctor, hospital with [place] [facility] is calling due to receiving VM regarding patient's heart monitor results. She is requesting the results be faxed to [phone] to be reviewed by their Doctor. Please advise.

## 2023-01-02 NOTE — Telephone Encounter (Signed)
Left message for patient to call and discuss scheduling the 6 month follow  up with Dr. Devona Konig, NP

## 2023-01-10 NOTE — Telephone Encounter (Signed)
Left message for patient to call and schedule 6 month follow up with Dr. Oval Linsey or Laurann Montana, NP

## 2023-01-22 ENCOUNTER — Encounter: Payer: Self-pay | Admitting: Student

## 2023-03-20 ENCOUNTER — Telehealth: Payer: Self-pay

## 2023-03-20 NOTE — Telephone Encounter (Signed)
LMOM Patient is due for Diabetic Follow up.

## 2023-05-30 ENCOUNTER — Ambulatory Visit: Payer: Medicare Other | Admitting: Cardiology

## 2023-07-14 ENCOUNTER — Ambulatory Visit (HOSPITAL_BASED_OUTPATIENT_CLINIC_OR_DEPARTMENT_OTHER): Payer: Medicare Other | Admitting: Cardiovascular Disease
